# Patient Record
Sex: Male | Born: 1960
Health system: Southern US, Community
[De-identification: ages and names within clinical notes are randomized; demographics above are authoritative.]

## PROBLEM LIST (undated history)

## (undated) ENCOUNTER — Emergency Department (HOSPITAL_COMMUNITY): Payer: Medicare Other

## (undated) DIAGNOSIS — R609 Edema, unspecified: Secondary | ICD-10-CM

## (undated) DIAGNOSIS — N138 Other obstructive and reflux uropathy: Secondary | ICD-10-CM

## (undated) DIAGNOSIS — G8929 Other chronic pain: Secondary | ICD-10-CM

## (undated) DIAGNOSIS — F1010 Alcohol abuse, uncomplicated: Secondary | ICD-10-CM

## (undated) DIAGNOSIS — E119 Type 2 diabetes mellitus without complications: Secondary | ICD-10-CM

## (undated) DIAGNOSIS — G629 Polyneuropathy, unspecified: Secondary | ICD-10-CM

## (undated) DIAGNOSIS — R600 Localized edema: Secondary | ICD-10-CM

## (undated) DIAGNOSIS — Q039 Congenital hydrocephalus, unspecified: Secondary | ICD-10-CM

## (undated) DIAGNOSIS — I10 Essential (primary) hypertension: Secondary | ICD-10-CM

## (undated) DIAGNOSIS — N182 Chronic kidney disease, stage 2 (mild): Secondary | ICD-10-CM

## (undated) DIAGNOSIS — I252 Old myocardial infarction: Secondary | ICD-10-CM

## (undated) DIAGNOSIS — N401 Enlarged prostate with lower urinary tract symptoms: Secondary | ICD-10-CM

## (undated) DIAGNOSIS — Z87828 Personal history of other (healed) physical injury and trauma: Secondary | ICD-10-CM

## (undated) DIAGNOSIS — N329 Bladder disorder, unspecified: Secondary | ICD-10-CM

## (undated) DIAGNOSIS — F1410 Cocaine abuse, uncomplicated: Secondary | ICD-10-CM

## (undated) DIAGNOSIS — E785 Hyperlipidemia, unspecified: Secondary | ICD-10-CM

## (undated) DIAGNOSIS — Z8673 Personal history of transient ischemic attack (TIA), and cerebral infarction without residual deficits: Secondary | ICD-10-CM

## (undated) DIAGNOSIS — Z8709 Personal history of other diseases of the respiratory system: Secondary | ICD-10-CM

## (undated) DIAGNOSIS — M79603 Pain in arm, unspecified: Secondary | ICD-10-CM

## (undated) DIAGNOSIS — R319 Hematuria, unspecified: Secondary | ICD-10-CM

## (undated) DIAGNOSIS — E11319 Type 2 diabetes mellitus with unspecified diabetic retinopathy without macular edema: Secondary | ICD-10-CM

## (undated) DIAGNOSIS — R3912 Poor urinary stream: Secondary | ICD-10-CM

## (undated) DIAGNOSIS — R531 Weakness: Secondary | ICD-10-CM

## (undated) DIAGNOSIS — R2681 Unsteadiness on feet: Secondary | ICD-10-CM

## (undated) DIAGNOSIS — I5032 Chronic diastolic (congestive) heart failure: Secondary | ICD-10-CM

## (undated) HISTORY — PX: CARDIAC CATHETERIZATION: SHX172

## (undated) HISTORY — PX: OTHER SURGICAL HISTORY: SHX169

---
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
## 1998-05-11 ENCOUNTER — Emergency Department (HOSPITAL_COMMUNITY): Admission: EM | Admit: 1998-05-11 | Discharge: 1998-05-11 | Payer: Self-pay | Admitting: Emergency Medicine

## 1998-05-19 ENCOUNTER — Inpatient Hospital Stay (HOSPITAL_COMMUNITY): Admission: EM | Admit: 1998-05-19 | Discharge: 1998-05-22 | Payer: Self-pay | Admitting: Emergency Medicine

## 1998-05-24 ENCOUNTER — Encounter: Admission: RE | Admit: 1998-05-24 | Discharge: 1998-08-22 | Payer: Self-pay | Admitting: Internal Medicine

## 1998-08-30 ENCOUNTER — Emergency Department (HOSPITAL_COMMUNITY): Admission: EM | Admit: 1998-08-30 | Discharge: 1998-08-30 | Payer: Self-pay | Admitting: Emergency Medicine

## 1998-12-31 ENCOUNTER — Emergency Department (HOSPITAL_COMMUNITY): Admission: EM | Admit: 1998-12-31 | Discharge: 1998-12-31 | Payer: Self-pay | Admitting: Emergency Medicine

## 1999-07-09 ENCOUNTER — Emergency Department (HOSPITAL_COMMUNITY): Admission: EM | Admit: 1999-07-09 | Discharge: 1999-07-09 | Payer: Self-pay

## 1999-07-20 ENCOUNTER — Emergency Department (HOSPITAL_COMMUNITY): Admission: EM | Admit: 1999-07-20 | Discharge: 1999-07-20 | Payer: Self-pay | Admitting: Emergency Medicine

## 2000-10-12 ENCOUNTER — Emergency Department (HOSPITAL_COMMUNITY): Admission: EM | Admit: 2000-10-12 | Discharge: 2000-10-12 | Payer: Self-pay | Admitting: Emergency Medicine

## 2000-12-12 ENCOUNTER — Emergency Department (HOSPITAL_COMMUNITY): Admission: EM | Admit: 2000-12-12 | Discharge: 2000-12-12 | Payer: Self-pay | Admitting: Emergency Medicine

## 2001-07-24 ENCOUNTER — Encounter: Payer: Self-pay | Admitting: Emergency Medicine

## 2001-07-24 ENCOUNTER — Emergency Department (HOSPITAL_COMMUNITY): Admission: EM | Admit: 2001-07-24 | Discharge: 2001-07-24 | Payer: Self-pay | Admitting: Unknown Physician Specialty

## 2002-06-07 ENCOUNTER — Encounter: Payer: Self-pay | Admitting: Emergency Medicine

## 2002-06-07 ENCOUNTER — Emergency Department (HOSPITAL_COMMUNITY): Admission: EM | Admit: 2002-06-07 | Discharge: 2002-06-07 | Payer: Self-pay | Admitting: Emergency Medicine

## 2003-03-17 ENCOUNTER — Emergency Department (HOSPITAL_COMMUNITY): Admission: EM | Admit: 2003-03-17 | Discharge: 2003-03-17 | Payer: Self-pay | Admitting: Emergency Medicine

## 2004-02-07 ENCOUNTER — Emergency Department (HOSPITAL_COMMUNITY): Admission: EM | Admit: 2004-02-07 | Discharge: 2004-02-07 | Payer: Self-pay | Admitting: Emergency Medicine

## 2004-02-20 ENCOUNTER — Emergency Department (HOSPITAL_COMMUNITY): Admission: EM | Admit: 2004-02-20 | Discharge: 2004-02-20 | Payer: Self-pay | Admitting: Emergency Medicine

## 2004-02-26 ENCOUNTER — Encounter: Admission: RE | Admit: 2004-02-26 | Discharge: 2004-02-26 | Payer: Self-pay | Admitting: Internal Medicine

## 2004-03-02 ENCOUNTER — Emergency Department (HOSPITAL_COMMUNITY): Admission: EM | Admit: 2004-03-02 | Discharge: 2004-03-02 | Payer: Self-pay | Admitting: Emergency Medicine

## 2004-05-14 ENCOUNTER — Emergency Department (HOSPITAL_COMMUNITY): Admission: EM | Admit: 2004-05-14 | Discharge: 2004-05-15 | Payer: Self-pay | Admitting: Emergency Medicine

## 2004-05-19 ENCOUNTER — Emergency Department (HOSPITAL_COMMUNITY): Admission: EM | Admit: 2004-05-19 | Discharge: 2004-05-19 | Payer: Self-pay | Admitting: Emergency Medicine

## 2004-05-20 ENCOUNTER — Emergency Department (HOSPITAL_COMMUNITY): Admission: EM | Admit: 2004-05-20 | Discharge: 2004-05-20 | Payer: Self-pay | Admitting: Emergency Medicine

## 2004-07-02 ENCOUNTER — Emergency Department (HOSPITAL_COMMUNITY): Admission: EM | Admit: 2004-07-02 | Discharge: 2004-07-02 | Payer: Self-pay

## 2004-12-11 ENCOUNTER — Emergency Department (HOSPITAL_COMMUNITY): Admission: EM | Admit: 2004-12-11 | Discharge: 2004-12-11 | Payer: Self-pay | Admitting: Emergency Medicine

## 2005-02-19 ENCOUNTER — Emergency Department (HOSPITAL_COMMUNITY): Admission: EM | Admit: 2005-02-19 | Discharge: 2005-02-19 | Payer: Self-pay | Admitting: Emergency Medicine

## 2005-02-20 ENCOUNTER — Emergency Department (HOSPITAL_COMMUNITY): Admission: EM | Admit: 2005-02-20 | Discharge: 2005-02-20 | Payer: Self-pay | Admitting: Family Medicine

## 2005-02-26 ENCOUNTER — Emergency Department (HOSPITAL_COMMUNITY): Admission: EM | Admit: 2005-02-26 | Discharge: 2005-02-27 | Payer: Self-pay | Admitting: Emergency Medicine

## 2005-04-10 ENCOUNTER — Emergency Department (HOSPITAL_COMMUNITY): Admission: EM | Admit: 2005-04-10 | Discharge: 2005-04-10 | Payer: Self-pay | Admitting: *Deleted

## 2005-04-14 ENCOUNTER — Ambulatory Visit (HOSPITAL_COMMUNITY): Admission: RE | Admit: 2005-04-14 | Discharge: 2005-04-14 | Payer: Self-pay | Admitting: Orthopaedic Surgery

## 2006-03-22 ENCOUNTER — Emergency Department (HOSPITAL_COMMUNITY): Admission: EM | Admit: 2006-03-22 | Discharge: 2006-03-22 | Payer: Self-pay | Admitting: Emergency Medicine

## 2006-10-16 ENCOUNTER — Emergency Department (HOSPITAL_COMMUNITY): Admission: EM | Admit: 2006-10-16 | Discharge: 2006-10-17 | Payer: Self-pay | Admitting: Emergency Medicine

## 2006-10-24 ENCOUNTER — Emergency Department (HOSPITAL_COMMUNITY): Admission: EM | Admit: 2006-10-24 | Discharge: 2006-10-24 | Payer: Self-pay | Admitting: Emergency Medicine

## 2006-11-06 ENCOUNTER — Ambulatory Visit: Payer: Self-pay | Admitting: Nurse Practitioner

## 2006-11-07 ENCOUNTER — Encounter (INDEPENDENT_AMBULATORY_CARE_PROVIDER_SITE_OTHER): Payer: Self-pay | Admitting: Nurse Practitioner

## 2006-11-07 ENCOUNTER — Ambulatory Visit: Payer: Self-pay | Admitting: *Deleted

## 2006-11-07 LAB — CONVERTED CEMR LAB: TSH: 2.186 microintl units/mL

## 2006-11-14 ENCOUNTER — Ambulatory Visit: Payer: Self-pay | Admitting: Nurse Practitioner

## 2006-12-19 ENCOUNTER — Ambulatory Visit: Payer: Self-pay | Admitting: Nurse Practitioner

## 2006-12-31 ENCOUNTER — Ambulatory Visit: Payer: Self-pay | Admitting: Family Medicine

## 2007-03-28 ENCOUNTER — Encounter (INDEPENDENT_AMBULATORY_CARE_PROVIDER_SITE_OTHER): Payer: Self-pay | Admitting: Nurse Practitioner

## 2007-03-28 DIAGNOSIS — Z6836 Body mass index (BMI) 36.0-36.9, adult: Secondary | ICD-10-CM

## 2007-03-28 DIAGNOSIS — I10 Essential (primary) hypertension: Secondary | ICD-10-CM

## 2007-03-28 DIAGNOSIS — E119 Type 2 diabetes mellitus without complications: Secondary | ICD-10-CM

## 2007-04-01 DIAGNOSIS — E1169 Type 2 diabetes mellitus with other specified complication: Secondary | ICD-10-CM

## 2007-04-01 DIAGNOSIS — E785 Hyperlipidemia, unspecified: Secondary | ICD-10-CM

## 2007-11-16 ENCOUNTER — Emergency Department (HOSPITAL_COMMUNITY): Admission: EM | Admit: 2007-11-16 | Discharge: 2007-11-16 | Payer: Self-pay | Admitting: Emergency Medicine

## 2008-02-03 ENCOUNTER — Emergency Department (HOSPITAL_COMMUNITY): Admission: EM | Admit: 2008-02-03 | Discharge: 2008-02-04 | Payer: Self-pay | Admitting: Emergency Medicine

## 2008-11-29 ENCOUNTER — Ambulatory Visit: Payer: Self-pay | Admitting: Cardiology

## 2008-11-29 ENCOUNTER — Inpatient Hospital Stay (HOSPITAL_COMMUNITY): Admission: EM | Admit: 2008-11-29 | Discharge: 2008-12-01 | Payer: Self-pay | Admitting: Emergency Medicine

## 2008-11-30 ENCOUNTER — Encounter: Payer: Self-pay | Admitting: Internal Medicine

## 2008-11-30 ENCOUNTER — Encounter: Payer: Self-pay | Admitting: Cardiology

## 2008-12-09 ENCOUNTER — Emergency Department (HOSPITAL_COMMUNITY): Admission: EM | Admit: 2008-12-09 | Discharge: 2008-12-09 | Payer: Self-pay | Admitting: *Deleted

## 2008-12-30 ENCOUNTER — Encounter (INDEPENDENT_AMBULATORY_CARE_PROVIDER_SITE_OTHER): Payer: Self-pay | Admitting: *Deleted

## 2009-02-12 ENCOUNTER — Telehealth: Payer: Self-pay | Admitting: Cardiology

## 2009-03-18 ENCOUNTER — Encounter (INDEPENDENT_AMBULATORY_CARE_PROVIDER_SITE_OTHER): Payer: Self-pay | Admitting: *Deleted

## 2009-10-19 ENCOUNTER — Emergency Department (HOSPITAL_COMMUNITY): Admission: EM | Admit: 2009-10-19 | Discharge: 2009-10-20 | Payer: Self-pay | Admitting: Emergency Medicine

## 2010-01-07 ENCOUNTER — Emergency Department (HOSPITAL_COMMUNITY): Admission: EM | Admit: 2010-01-07 | Discharge: 2010-01-07 | Payer: Self-pay | Admitting: Emergency Medicine

## 2010-04-06 ENCOUNTER — Ambulatory Visit: Payer: Self-pay | Admitting: Internal Medicine

## 2010-04-06 LAB — CONVERTED CEMR LAB
ALT: <8 units/L (ref 0–53)
AST: 11 units/L (ref 0–37)
Albumin: 4.3 g/dL (ref 3.5–5.2)
Alkaline Phosphatase: 70 units/L (ref 39–117)
BUN: 12 mg/dL (ref 6–23)
CO2: 28 meq/L (ref 19–32)
Calcium: 9.6 mg/dL (ref 8.4–10.5)
Chloride: 97 meq/L (ref 96–112)
Cholesterol: 245 mg/dL — ABNORMAL HIGH (ref 0–200)
Creatinine, Ser: 0.94 mg/dL (ref 0.40–1.50)
Glucose, Bld: 326 mg/dL — ABNORMAL HIGH (ref 70–99)
HDL: 46 mg/dL (ref >39)
Microalb, Ur: 8.83 mg/dL — ABNORMAL HIGH (ref 0.00–1.89)
Potassium: 5.2 meq/L (ref 3.5–5.3)
Sodium: 135 meq/L (ref 135–145)
Total Bilirubin: 0.4 mg/dL (ref 0.3–1.2)
Total CHOL/HDL Ratio: 5.3
Total Protein: 7.5 g/dL (ref 6.0–8.3)
Triglycerides: 446 mg/dL — ABNORMAL HIGH (ref <150)

## 2010-06-30 ENCOUNTER — Encounter (INDEPENDENT_AMBULATORY_CARE_PROVIDER_SITE_OTHER): Payer: Self-pay | Admitting: Internal Medicine

## 2010-06-30 LAB — CONVERTED CEMR LAB
ALT: 11 units/L (ref 0–53)
AST: 11 units/L (ref 0–37)
Albumin: 4.3 g/dL (ref 3.5–5.2)
Alkaline Phosphatase: 48 units/L (ref 39–117)
Anti Nuclear Antibody(ANA): NEGATIVE
BUN: 14 mg/dL (ref 6–23)
CO2: 29 meq/L (ref 19–32)
Calcium: 9.4 mg/dL (ref 8.4–10.5)
Chloride: 106 meq/L (ref 96–112)
Creatinine, Ser: 1.01 mg/dL (ref 0.40–1.50)
Glucose, Bld: 245 mg/dL — ABNORMAL HIGH (ref 70–99)
Hgb A1c MFr Bld: 8.9 % — ABNORMAL HIGH (ref <5.7)
Microalb, Ur: 8.36 mg/dL — ABNORMAL HIGH (ref 0.00–1.89)
Potassium: 5 meq/L (ref 3.5–5.3)
Sodium: 141 meq/L (ref 135–145)
Total Bilirubin: 0.4 mg/dL (ref 0.3–1.2)
Total Protein: 6.9 g/dL (ref 6.0–8.3)

## 2010-08-15 HISTORY — PX: CARDIOVASCULAR STRESS TEST: SHX262

## 2010-10-16 LAB — DIFFERENTIAL
Basophils Absolute: 0 10*3/uL (ref 0.0–0.1)
Basophils Relative: 1 % (ref 0–1)
Eosinophils Absolute: 0 10*3/uL (ref 0.0–0.7)
Eosinophils Relative: 1 % (ref 0–5)
Lymphocytes Relative: 14 % (ref 12–46)
Lymphs Abs: 0.9 10*3/uL (ref 0.7–4.0)
Monocytes Absolute: 0.6 10*3/uL (ref 0.1–1.0)
Monocytes Relative: 9 % (ref 3–12)
Neutro Abs: 5.1 10*3/uL (ref 1.7–7.7)
Neutrophils Relative %: 76 % (ref 43–77)

## 2010-10-16 LAB — URINALYSIS, ROUTINE W REFLEX MICROSCOPIC
Glucose, UA: >1000 mg/dL — AB
Hgb urine dipstick: NEGATIVE
Ketones, ur: 15 mg/dL — AB
Leukocytes, UA: NEGATIVE
Nitrite: NEGATIVE
Protein, ur: 30 mg/dL — AB
Specific Gravity, Urine: 1.026 (ref 1.005–1.030)
Urobilinogen, UA: 1 mg/dL (ref 0.0–1.0)
pH: 5.5 (ref 5.0–8.0)

## 2010-10-16 LAB — CBC
HCT: 45.5 % (ref 39.0–52.0)
Hemoglobin: 14.6 g/dL (ref 13.0–17.0)
MCHC: 32.1 g/dL (ref 30.0–36.0)
MCV: 84.8 fL (ref 78.0–100.0)
Platelets: 201 10*3/uL (ref 150–400)
RBC: 5.36 MIL/uL (ref 4.22–5.81)
RDW: 13.3 % (ref 11.5–15.5)
WBC: 6.7 10*3/uL (ref 4.0–10.5)

## 2010-10-16 LAB — COMPREHENSIVE METABOLIC PANEL
ALT: 16 U/L (ref 0–53)
AST: 22 U/L (ref 0–37)
Albumin: 3.7 g/dL (ref 3.5–5.2)
Alkaline Phosphatase: 56 U/L (ref 39–117)
BUN: 5 mg/dL — ABNORMAL LOW (ref 6–23)
CO2: 26 mEq/L (ref 19–32)
Calcium: 8.9 mg/dL (ref 8.4–10.5)
Chloride: 100 mEq/L (ref 96–112)
Creatinine, Ser: 0.78 mg/dL (ref 0.4–1.5)
GFR calc Af Amer: >60 mL/min (ref >60)
GFR calc non Af Amer: >60 mL/min (ref >60)
Glucose, Bld: 258 mg/dL — ABNORMAL HIGH (ref 70–99)
Potassium: 4 mEq/L (ref 3.5–5.1)
Sodium: 134 mEq/L — ABNORMAL LOW (ref 135–145)
Total Bilirubin: 0.7 mg/dL (ref 0.3–1.2)
Total Protein: 6.9 g/dL (ref 6.0–8.3)

## 2010-10-16 LAB — POCT CARDIAC MARKERS
CKMB, poc: <1 ng/mL — ABNORMAL LOW (ref 1.0–8.0)
CKMB, poc: <1 ng/mL — ABNORMAL LOW (ref 1.0–8.0)
Myoglobin, poc: 31.9 ng/mL (ref 12–200)
Myoglobin, poc: 34.8 ng/mL (ref 12–200)
Troponin i, poc: <0.05 ng/mL (ref 0.00–0.09)
Troponin i, poc: <0.05 ng/mL (ref 0.00–0.09)

## 2010-10-16 LAB — URINE MICROSCOPIC-ADD ON

## 2010-10-16 LAB — GLUCOSE, CAPILLARY: Glucose-Capillary: 183 mg/dL — ABNORMAL HIGH (ref 70–99)

## 2010-11-01 LAB — CARDIAC PANEL(CRET KIN+CKTOT+MB+TROPI): Troponin I: 0.01 ng/mL (ref 0.00–0.06)

## 2010-11-01 LAB — LIPID PANEL
HDL: 46 mg/dL (ref >39)
Total CHOL/HDL Ratio: 4.2 RATIO
Triglycerides: 53 mg/dL (ref <150)
VLDL: 11 mg/dL (ref 0–40)

## 2010-11-01 LAB — CBC
HCT: 41.9 % (ref 39.0–52.0)
HCT: 37.4 % — ABNORMAL LOW (ref 39.0–52.0)
HCT: 41.1 % (ref 39.0–52.0)
Hemoglobin: 14.1 g/dL (ref 13.0–17.0)
Hemoglobin: 12.6 g/dL — ABNORMAL LOW (ref 13.0–17.0)
Hemoglobin: 13.9 g/dL (ref 13.0–17.0)
MCHC: 33.6 g/dL (ref 30.0–36.0)
MCHC: 33.6 g/dL (ref 30.0–36.0)
MCV: 83.2 fL (ref 78.0–100.0)
MCV: 82.9 fL (ref 78.0–100.0)
MCV: 83 fL (ref 78.0–100.0)
Platelets: 179 10*3/uL (ref 150–400)
Platelets: 183 10*3/uL (ref 150–400)
RDW: 13.3 % (ref 11.5–15.5)
RDW: 13 % (ref 11.5–15.5)
WBC: 9.8 10*3/uL (ref 4.0–10.5)

## 2010-11-01 LAB — COMPREHENSIVE METABOLIC PANEL
ALT: 15 U/L (ref 0–53)
AST: 19 U/L (ref 0–37)
Alkaline Phosphatase: 59 U/L (ref 39–117)
CO2: 26 mEq/L (ref 19–32)
Chloride: 102 mEq/L (ref 96–112)
Creatinine, Ser: 0.8 mg/dL (ref 0.4–1.5)
GFR calc Af Amer: >60 mL/min (ref >60)
GFR calc non Af Amer: >60 mL/min (ref >60)
Potassium: 4 mEq/L (ref 3.5–5.1)
Total Bilirubin: 1.1 mg/dL (ref 0.3–1.2)

## 2010-11-01 LAB — PROTIME-INR
INR: 1 (ref 0.00–1.49)
Prothrombin Time: 13.3 seconds (ref 11.6–15.2)

## 2010-11-01 LAB — DIFFERENTIAL
Basophils Absolute: 0 10*3/uL (ref 0.0–0.1)
Basophils Relative: 0 % (ref 0–1)
Eosinophils Relative: 0 % (ref 0–5)
Monocytes Absolute: 0.3 10*3/uL (ref 0.1–1.0)

## 2010-11-01 LAB — HEPARIN LEVEL (UNFRACTIONATED): Heparin Unfractionated: 0.61 IU/mL (ref 0.30–0.70)

## 2010-11-01 LAB — GLUCOSE, CAPILLARY: Glucose-Capillary: 156 mg/dL — ABNORMAL HIGH (ref 70–99)

## 2010-11-01 LAB — POCT CARDIAC MARKERS

## 2010-11-01 LAB — POCT I-STAT, CHEM 8
BUN: 8 mg/dL (ref 6–23)
Calcium, Ion: 1.04 mmol/L — ABNORMAL LOW (ref 1.12–1.32)
HCT: 45 % (ref 39.0–52.0)
TCO2: 24 mmol/L (ref 0–100)

## 2010-11-01 LAB — APTT: aPTT: 30 seconds (ref 24–37)

## 2010-11-01 LAB — TSH: TSH: 0.866 u[IU]/mL (ref 0.350–4.500)

## 2010-12-06 NOTE — Cardiovascular Report (Signed)
NAMEKANNEN, MOXEY NO.:  [id number]   MEDICAL RECORD NO.:  [id number]          PATIENT TYPE:  INP   LOCATION:  4705                         FACILITY:  [facility]   PHYSICIAN:  Rollene Rotunda, MD, FACCDATE OF BIRTH:  [birth year]   DATE OF PROCEDURE:  12/01/2008  DATE OF DISCHARGE:  12/01/2008                            CARDIAC CATHETERIZATION   PRIMARY CARE Ryken Paschal:  None.   CARDIOLOGIST:  Rollene Rotunda, MD, [facility]   PROCEDURE:  Left heart catheterization/coronary arteriography.   INDICATION:  Evaluate the patient with chest pain, consistent with  unstable angina and possible inferior infarct with peri-infarct ischemia  on Cardiolite.   PROCEDURE NOTE:  Left heart catheterization was performed via the right  femoral artery.  The aorta was cannulated using anterior wall puncture.  A #5-French arterial sheath was inserted via the modified Seldinger  technique.  Preformed Judkins and pigtail catheters were utilized.  The  patient tolerated the procedure well and left the lab in stable  condition.   RESULTS:  Hemodynamics:  LV 173/13, AO 176/90.  Coronaries:  Left main was normal.  The LAD was normal.  First diagonal  was moderate in size and normal.  The circumflex was large and normal.  An obtuse marginal was moderate sized and normal.  The ramus  intermediate was moderate sized and normal.  Right coronary artery was  dominant and large.  There was mid long 25% stenosis.  There was mid  long 40% stenosis.  Left ventriculogram:  Left ventriculogram was obtained in the RAO  projection.  The EF was 65% with normal wall motion.   CONCLUSION:  1. Mild coronary plaque.  2. Normal left ventricular function.   PLAN:  The patient will have aggressive medical management and primary  risk reduction.  The etiology of the chest discomfort is clearly not  anginal and maybe musculoskeletal or gastrointestinal.  He should follow  with a primary care physician for evaluation  of this.      Rollene Rotunda, MD, [facility]  Electronically Signed     JH/MEDQ  D:  12/01/2008  T:  12/02/2008  Job:  [id number]
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
## 2010-12-06 NOTE — Consult Note (Signed)
NAMEALPHONZA, [name] NO.:  [id number]   MEDICAL RECORD NO.:  [id number]          PATIENT TYPE:  INP   LOCATION:  4705                         FACILITY:  [facility]   PHYSICIAN:  Renee Ramus, MD       DATE OF BIRTH:  1961/05/10   DATE OF CONSULTATION:  DATE OF DISCHARGE:  12/01/2008                                 CONSULTATION   HISTORY OF PRESENT ILLNESS:  The patient is a 50 year old male who is  admitted secondary to chest and right shoulder pain.  The patient was  admitted by Cardiology and underwent a stress Myoview, now being  discharged to home.  I was called to see the patient secondary to  elevated blood sugars and consultation regarding diabetes and  hypertension.   PAST MEDICAL HISTORY:  1. Diabetes type 2 diagnosed in 1999, but not treated.  2. Hypertension.  3. Hyperlipemia.   SOCIAL HISTORY:  The patient reports smoking 1 pack per day, denies  alcohol use.   FAMILY HISTORY:  Not available.   REVIEW OF SYSTEMS:  All other comprehensive review of systems are  negative.   MEDICATIONS:  Prehospital, he was not taking any medications.   PHYSICAL EXAMINATION:  GENERAL:  This is a well-developed, well-  nourished [place] American male currently in no apparent distress.  VITAL SIGNS:  Blood pressure 165/88, heart rate 60, respiratory rate 20,  temperature 99.2, he is 99% on room air.  HEENT:  No jugular venous distention or lymphadenopathy.  Oropharynx is  clear.  Mucous membranes are pink and moist.  TMs clear bilaterally.  Pupils are equal, reactive to light and accommodation.  Extraocular  muscles are intact.  CARDIOVASCULAR:  Regular rate and rhythm without murmurs, rubs, or  gallops.  PULMONARY:  Lungs are clear to auscultation bilaterally.  ABDOMEN:  Soft, nontender, and nondistended without hepatosplenomegaly.  Bowel sounds are present.  He has no rebound or guarding.  EXTREMITIES:  He has no clubbing, cyanosis, or edema.  He has good  peripheral pulses in dorsalis pedis and radial artery.  He is able to  move all extremities.  NEUROLOGIC:  Cranial nerves II through XII are grossly intact.  He has  no focal neurological deficits.   STUDIES:  1. Stress Myoview shows no evidence of ischemia or infarction with an      EF of approximately 40%.  2. Chest x-ray shows moderate cardiomegaly.  3. EKG shows no acute disease.   LABORATORY DATA:  With count 6.1, H&H 12/37, platelets 179.  Sodium 139,  potassium 4.0, chloride 102, bicarb 26, BUN 5, creatinine 0.8, glucose  247.   ASSESSMENT:  1. Right shoulder pain, negative cardiac workup per cards.  2. Hypertension.  We will begin the patient on lisinopril 10 mg p.o.      daily considering his diabetes.  3. Diabetes type 2.  The patient should follow with health serv and      has contact information for that.  We will start him on Metformin      500 mg p.o. daily.  4. Hyperlipidemia.  The patient  has received a statin from Cardiology.      He will continue that and follow up with his primary care      physician.   DISPOSITION:  The patient is stable for discharge to  home.  Consult  note was written by referring with the Cardiologist, reviewing the  medical history, and reviewing the personal history.   TIME SPENT:  45 minutes.      Renee Ramus, MD  Electronically Signed     JF/MEDQ  D:  12/01/2008  T:  12/02/2008  Job:  [id number]
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
## 2010-12-09 NOTE — Discharge Summary (Signed)
NAMEANJEL, PARDO NO.:  [id number]   MEDICAL RECORD NO.:  [id number]          PATIENT TYPE:  INP   LOCATION:  4705                         FACILITY:  [facility]   PHYSICIAN:  Rollene Rotunda, MD, FACCDATE OF BIRTH:  09-10-1960   DATE OF ADMISSION:  11/29/2008  DATE OF DISCHARGE:  12/01/2008                               DISCHARGE SUMMARY   PRIMARY CARDIOLOGIST:  Rollene Rotunda, MD, [facility]   PRIMARY CARE:  [facility].   DISCHARGE DIAGNOSES:  1. Chest pain, negative cardiac workup for myocardial infarction      status post cardiac catheterization showing mild coronary plaque,      normal left ventricular function.  2. Diabetes, poorly controlled and untreated:  The patient received      Internal Medicine consult this admission regarding a hemoglobin A1c      of 12.9, was started on metformin for his diabetes and instructed      to follow up as an outpatient with [facility] for further      management.  3. Abnormal stress Myoview this admission, prompting cardiac      catheterization.  The patient had a stress Myoview on Dec 01, 2008,      showing an EF of 42% and questionable soft tissue attenuation,      lateral and apical hypokinesis, and an abnormal echocardiogram      showing an ejection fraction of 55% with septal, basal, and      inferior hypokinesis with mild left ventricular hypertrophy.  4. Hypercholesteremia:  Total cholesterol 193, triglycerides 53, HDL      46, and LDL 136.  The patient started on statin this admission.   PAST MEDICAL HISTORY:  1. Hypertension.  2. Diabetes, untreated.  3. Hyperlipidemia.  4. Tobacco abuse.   Mr. Turano is a 50 year old African American male who presented with  chest and right shoulder pain.  He underwent a stress Myoview that was  abnormal and an echocardiogram as described above, prompting cardiac  catheterization showing normal LV with mild coronary plaque.  Hemoglobin  A1c 12.9.  Internal Medicine was asked  to assist with management of  diabetes.  The patient was started on metformin.  Diabetic education  provided.  The patient remained stable and discharged home to follow up  with Dr. Antoine Poche and [facility].  The patient was not on any  medications prior to hospitalization.   At the time of discharge, he was placed on:  1. Simvastatin 40 mg daily.  2. Aspirin 325.  3. Metformin 500 mg daily.  4. Lisinopril 10 mg daily.  The patient's beta-blocker was discontinued secondary to baseline heart  rate in the 50s.   He has a scheduled followup with Dr. Antoine Poche on December 28, 2008, at 3:45.  He was given the post cardiac catheterization discharge instructions.   DURATION OF DISCHARGE ENCOUNTER:  Less than 30 minutes.      Dorian Pod, ACNP      Rollene Rotunda, MD, [facility]  Electronically Signed    MB/MEDQ  D:  01/04/2009  T:  01/05/2009  Job:  [id number]

## 2010-12-20 ENCOUNTER — Inpatient Hospital Stay (HOSPITAL_COMMUNITY): Payer: Self-pay

## 2010-12-20 ENCOUNTER — Inpatient Hospital Stay (HOSPITAL_COMMUNITY)
Admission: EM | Admit: 2010-12-20 | Discharge: 2010-12-22 | DRG: 069 | Disposition: A | Discharge status: Home / Self Care | Payer: Self-pay | Attending: Internal Medicine | Admitting: Internal Medicine

## 2010-12-20 ENCOUNTER — Emergency Department (HOSPITAL_COMMUNITY): Payer: Self-pay

## 2010-12-20 DIAGNOSIS — F1410 Cocaine abuse, uncomplicated: Secondary | ICD-10-CM | POA: Diagnosis present

## 2010-12-20 DIAGNOSIS — I6350 Cerebral infarction due to unspecified occlusion or stenosis of unspecified cerebral artery: Secondary | ICD-10-CM

## 2010-12-20 DIAGNOSIS — F17200 Nicotine dependence, unspecified, uncomplicated: Secondary | ICD-10-CM | POA: Diagnosis present

## 2010-12-20 DIAGNOSIS — E785 Hyperlipidemia, unspecified: Secondary | ICD-10-CM | POA: Diagnosis present

## 2010-12-20 DIAGNOSIS — Z5900 Homelessness unspecified: Secondary | ICD-10-CM

## 2010-12-20 DIAGNOSIS — IMO0002 Reserved for concepts with insufficient information to code with codable children: Secondary | ICD-10-CM | POA: Diagnosis present

## 2010-12-20 DIAGNOSIS — I10 Essential (primary) hypertension: Secondary | ICD-10-CM | POA: Diagnosis present

## 2010-12-20 DIAGNOSIS — G459 Transient cerebral ischemic attack, unspecified: Principal | ICD-10-CM | POA: Diagnosis present

## 2010-12-20 DIAGNOSIS — Z9119 Patient's noncompliance with other medical treatment and regimen: Secondary | ICD-10-CM

## 2010-12-20 DIAGNOSIS — E1165 Type 2 diabetes mellitus with hyperglycemia: Secondary | ICD-10-CM | POA: Diagnosis present

## 2010-12-20 DIAGNOSIS — R209 Unspecified disturbances of skin sensation: Secondary | ICD-10-CM | POA: Diagnosis present

## 2010-12-20 DIAGNOSIS — Z91199 Patient's noncompliance with other medical treatment and regimen due to unspecified reason: Secondary | ICD-10-CM

## 2010-12-20 LAB — DIFFERENTIAL
Basophils Relative: 1 % (ref 0–1)
Eosinophils Absolute: 0.2 10*3/uL (ref 0.0–0.7)
Lymphs Abs: 2 10*3/uL (ref 0.7–4.0)
Monocytes Absolute: 0.8 10*3/uL (ref 0.1–1.0)
Monocytes Relative: 11 % (ref 3–12)
Neutrophils Relative %: 60 % (ref 43–77)

## 2010-12-20 LAB — URINALYSIS, ROUTINE W REFLEX MICROSCOPIC
Glucose, UA: >1000 mg/dL — AB
Hgb urine dipstick: NEGATIVE
Protein, ur: NEGATIVE mg/dL
Specific Gravity, Urine: 1.022 (ref 1.005–1.030)
pH: 7 (ref 5.0–8.0)

## 2010-12-20 LAB — CK TOTAL AND CKMB (NOT AT ARMC)
CK, MB: 2.1 ng/mL (ref 0.3–4.0)
Relative Index: 1.8 (ref 0.0–2.5)
Total CK: 120 U/L (ref 7–232)

## 2010-12-20 LAB — BASIC METABOLIC PANEL
BUN: 12 mg/dL (ref 6–23)
Chloride: 100 mEq/L (ref 96–112)
Potassium: 4.6 mEq/L (ref 3.5–5.1)

## 2010-12-20 LAB — COMPREHENSIVE METABOLIC PANEL
Albumin: 3.7 g/dL (ref 3.5–5.2)
BUN: 12 mg/dL (ref 6–23)
Calcium: 9.1 mg/dL (ref 8.4–10.5)
Creatinine, Ser: 0.61 mg/dL (ref 0.4–1.5)
Total Protein: 7.1 g/dL (ref 6.0–8.3)

## 2010-12-20 LAB — CARDIAC PANEL(CRET KIN+CKTOT+MB+TROPI)
CK, MB: 2 ng/mL (ref 0.3–4.0)
Total CK: 103 U/L (ref 7–232)
Troponin I: <0.3 ng/mL (ref <0.30)

## 2010-12-20 LAB — CBC
HCT: 42.2 % (ref 39.0–52.0)
Hemoglobin: 14 g/dL (ref 13.0–17.0)
Hemoglobin: 14.3 g/dL (ref 13.0–17.0)
MCH: 27.7 pg (ref 26.0–34.0)
MCHC: 33.7 g/dL (ref 30.0–36.0)
MCHC: 33.9 g/dL (ref 30.0–36.0)
MCV: 82 fL (ref 78.0–100.0)
RBC: 5.06 MIL/uL (ref 4.22–5.81)

## 2010-12-20 LAB — TROPONIN I
Troponin I: <0.3 ng/mL (ref <0.30)
Troponin I: <0.3 ng/mL (ref <0.30)

## 2010-12-20 LAB — APTT: aPTT: 27 seconds (ref 24–37)

## 2010-12-20 LAB — RAPID URINE DRUG SCREEN, HOSP PERFORMED
Amphetamines: NOT DETECTED
Barbiturates: NOT DETECTED
Benzodiazepines: NOT DETECTED
Opiates: NOT DETECTED

## 2010-12-20 LAB — HEMOGLOBIN A1C: Mean Plasma Glucose: 249 mg/dL — ABNORMAL HIGH (ref <117)

## 2010-12-20 LAB — URINE MICROSCOPIC-ADD ON

## 2010-12-20 LAB — GLUCOSE, CAPILLARY: Glucose-Capillary: 231 mg/dL — ABNORMAL HIGH (ref 70–99)

## 2010-12-20 LAB — ETHANOL: Alcohol, Ethyl (B): <11 mg/dL — ABNORMAL HIGH (ref 0–10)

## 2010-12-21 ENCOUNTER — Inpatient Hospital Stay (HOSPITAL_COMMUNITY): Payer: Self-pay

## 2010-12-21 DIAGNOSIS — I6350 Cerebral infarction due to unspecified occlusion or stenosis of unspecified cerebral artery: Secondary | ICD-10-CM

## 2010-12-21 LAB — CBC
HCT: 40.9 % (ref 39.0–52.0)
MCH: 27.9 pg (ref 26.0–34.0)
MCV: 80.8 fL (ref 78.0–100.0)
Platelets: 213 10*3/uL (ref 150–400)
RBC: 5.06 MIL/uL (ref 4.22–5.81)

## 2010-12-21 LAB — CARDIAC PANEL(CRET KIN+CKTOT+MB+TROPI)
CK, MB: 1.8 ng/mL (ref 0.3–4.0)
Total CK: 87 U/L (ref 7–232)

## 2010-12-21 LAB — LIPID PANEL
Cholesterol: 205 mg/dL — ABNORMAL HIGH (ref 0–200)
HDL: 55 mg/dL (ref >39)
LDL Cholesterol: 115 mg/dL — ABNORMAL HIGH (ref 0–99)
Total CHOL/HDL Ratio: 3.7 RATIO
Triglycerides: 175 mg/dL — ABNORMAL HIGH (ref <150)

## 2010-12-21 LAB — GLUCOSE, CAPILLARY
Glucose-Capillary: 309 mg/dL — ABNORMAL HIGH (ref 70–99)
Glucose-Capillary: 241 mg/dL — ABNORMAL HIGH (ref 70–99)
Glucose-Capillary: 240 mg/dL — ABNORMAL HIGH (ref 70–99)

## 2010-12-21 LAB — BASIC METABOLIC PANEL
BUN: 11 mg/dL (ref 6–23)
Chloride: 95 mEq/L — ABNORMAL LOW (ref 96–112)
GFR calc non Af Amer: >60 mL/min (ref >60)
Potassium: 4.4 mEq/L (ref 3.5–5.1)
Sodium: 132 mEq/L — ABNORMAL LOW (ref 135–145)

## 2010-12-22 LAB — BASIC METABOLIC PANEL
Calcium: 9.2 mg/dL (ref 8.4–10.5)
Creatinine, Ser: 0.68 mg/dL (ref 0.4–1.5)
GFR calc Af Amer: >60 mL/min (ref >60)
GFR calc non Af Amer: >60 mL/min (ref >60)
Sodium: 134 mEq/L — ABNORMAL LOW (ref 135–145)

## 2010-12-22 LAB — GLUCOSE, CAPILLARY
Glucose-Capillary: 309 mg/dL — ABNORMAL HIGH (ref 70–99)
Glucose-Capillary: 314 mg/dL — ABNORMAL HIGH (ref 70–99)
Glucose-Capillary: 272 mg/dL — ABNORMAL HIGH (ref 70–99)

## 2011-01-02 NOTE — Discharge Summary (Signed)
[name], [name] NO.:  [id number]  MEDICAL RECORD NO.:  [id number]  LOCATION:  3009                         FACILITY:  [facility]  PHYSICIAN:  Mariea Stable, MD   DATE OF BIRTH:  1961-07-05  DATE OF ADMISSION:  12/20/2010 DATE OF DISCHARGE:  12/22/2010                              DISCHARGE SUMMARY   DISCHARGE DIAGNOSES: 1. Possible transient ischemic attack.  CT of head and MRI with no     acute processes. 2. Uncontrolled diabetes mellitus type 2, hemoglobin A1c of 10.8. 3. Hypertension. 4. Hyperlipidemia.  DISCHARGE MEDICATIONS: 1. Aspirin 81 mg once a day. 2. Pravastatin 10 mg once a day. 3. Metformin 500 mg twice a day. 4. Lisinopril 10 mg once a day. 5. Glipizide 5 mg daily.  DISPOSITION AND FOLLOWUP:  The patient will follow up at [facility] on Friday December 30, 2010, at 9.15 a.m. for a hospital followup.  At the next office  visit, please check blood glucose level and ensure patient has further outpatient diabetes education. Furthemore a basic metabolic panel should be rechecked since the patient was started on lisinopril during this hospital admission.  Metformin and glipizide were initiated during this hospital admission given no prior treatment and these will need further titration.  Please evaluate for any side effects.  HOwever, given A1c, patient will likely need insulin treatment as  opposed to oral anti-hyperglycemics only.  PROCEDURES PERFORMED:   1. CT without contrast of the head.  There were no acute intracranial findings.  No CT evidence of acute infarction.  No intraparenchymal hemorrhage.  Hydrocephalus is unchanged from prior CT.   2. MRI of brain without contrast.  No acute or reversible findings.  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  No evidence for chronic brain ischemic insults either.   3. Carotid Doppler, no significant extracranial carotid artery stenosis demonstrated.  Vertebrals are  patent with antegrade flow.   4. Lumbar puncture with PT/OT consult evaluation, pre and postintervention, opening pressure 17.5 cm of water and 30 mL of CSF removed.  No acute changes noted by PT/OT after removal of fluid.  CONSULTATIONS:  Social Work consult for homelessness and placement issues as well as substance abuse with cocaine.  PT/OT consult for possible TIA and evaluation of normal pressure hydrocephalus post LP.  BRIEF ADMITTING HISTORY AND PHYSICAL:  This is a 50 year old man with past medical history of diabetes mellitus, hypertension, and hyperlipidemia, who presents with a 1-week history of left lower extremity weakness and a 1-day history of left lower and upper extremity numbness and tingling and he was admitted for the evaluation of a possible TIA.  He also reports sporadic gait instability.  He denies nausea, vomiting, urinary incontinence, diarrhea, constipation, change in appetite, loss of consciousness, chest pain, changes in vision, or speech patterns.  The patient is almost was noncompliant with his medicines prior to his admission, which included metformin, aspirin, lisinopril, and simvastatin.  He does smoke a pack a day of cigarettes and was found to be positive for cocaine on a urine drug screen during this hospitalization.  LABORATORY DATA:  Admission labs from Dec 20, 2010, CBC, white blood cell count 7.7,  hemoglobin 14.0,  hematocrit 41.5, MCV 82, platelet count 211.  Neutrophils 60%, lymphocytes 26%, monocytes 11%, eosinophils 3%, basophils 1%.  Absolute neutrophil count 4.7, absolute lymphocyte count 2.0, absolute monocyte 0.8, absolute eosinophil 0.2, absolute basophil 0.1.  Coagulation studies:  PT 13.4 seconds, INR 1.0. Activated PTP 27 seconds.  Basic metabolic panel:  Sodium 140, potassium 4.6, chloride 100, CO2 of 33, glucose 299, BUN 12, creatinine 0.69. Estimated GFR is greater than 60, calcium 9.4.  Cardiac enzymes from 9 a.m. on Dec 20, 2010, troponin less than 0.3 at noon.  On Dec 20, 2010, troponin less than 0.3.  CK-MB was 2.1 and creatinine kinase was 220. Urinalysis showed yellow color that was clear.  Specific gravity 1.022, pH 7.0.  Urine glucose was greater than 1000.  Bilirubin negative, ketones negative, blood negative, protein negative.  Urobilinogen was 1.0.  Nitrite negative.  Leukocytes negative.  EtOH negative.  Urine drug screen was positive for cocaine.  The thyroid stimulating hormone was 1.715, hemoglobin A1c 10.3, vitamin B12 level of 530, and folate was 8.4.  HOSPITAL COURSE: 1. Possible TIA. CVA/TIA workup was initiated       included a CT without contrast, MRI     without contrast, and the carotid artery Doppler study.  All of     these studies were negative for acute processes except that the     head imaging displayed hydrocephaly with ventriculomegaly, which     was unchanged since the head CT done in 2005.  Folate, B12, TSH,     UA, CBC, and a complete metabolic panel were all found to be within     normal limits with the exception that the blood glucose was found     to be 300 with a Hgb A1c level determined to be 10.3.  Additionally, urine     drug screen was found to be positive for cocaine.  The patient was     admitted for monitoring status post possible TIA.  Given the     hydrocephalus and head imaging and history of gait instability, a     workup for normal pressure hydrocephalus was performed, which     included a lumbar puncture with pre and postintervention PT     evaluation.  The opening pressure was found to be 17.5 cm of water     and after removal of 30 mL of cerebral spinal fluid, no changes     were observed in that physical therapy evaluation, thus effectively     ruling out normal pressure hydrocephalus.  The patient reported no     adverse effects from the lumbar puncture and CSF removal and had no     other complaints.  His left lower extremity weakness improved over      the course of 2-day admission, however, residual diminished     sensation likely due to peripheral neuropathy from longstanding     uncontrolled diabetes, remained on the plantar aspect of his left     foot. 2. Diabetes mellitus type 2.  The patient was noncompliant with his     outpatient diabetes regimen of metformin and presented to the ED     with blood sugars in the 300 range and a hemoglobin A1c of 10.3.     He was placed on sliding scale insulin, but his blood sugar levels     remained elevated.  He was discharged with glipizide and metformin. 3. Hypertension.  The patient is again noncompliant with  his     hypertension regimen, which consists of lisinopril.  His blood     pressure range from a 130-150 systolic.  He was discharged with     lisinopril. 4. Hyperlipidemia.  His cholesterol panel during this hospitalization     showed a cholesterol level of 205, triglyceride level of a 175, HDL     55, LDL 115. Pravastatin was intiated.  5. Polysubstance abuse.  UDS was positive for cocaine.  Social Work was     consulted for counseling and to discuss the compliance     issues regarding his ability to take his outpatient medicines.  DISCHARGE VITAL AND LABS:  Temperature 98, pulse 75, respiratory rate 20, blood pressure 126/80, O2 saturation 90% on room air.   BMP Sodium 134, potassium 4.6, chloride 98, CO2 of 28, glucose 276, BUN 10, creatinine 0.68, GFR greater than 60, calcium 9.2.    ______________________________ Almyra Deforest, MD   ______________________________ Mariea Stable, MD    JI/MEDQ  D:  12/23/2010  T:  12/23/2010  Job:  [id number]  cc:   [facility]  Electronically Signed by Almyra Deforest MD on 12/31/2010 10:19:03 PM Electronically Signed by Mariea Stable MD on 01/02/2011 09:04:06 AM

## 2011-02-22 ENCOUNTER — Inpatient Hospital Stay (HOSPITAL_COMMUNITY)
Admission: EM | Admit: 2011-02-22 | Discharge: 2011-02-24 | DRG: 069 | Disposition: A | Discharge status: Home / Self Care | Payer: Self-pay | Attending: Internal Medicine | Admitting: Internal Medicine

## 2011-02-22 ENCOUNTER — Emergency Department (HOSPITAL_COMMUNITY): Payer: Self-pay

## 2011-02-22 DIAGNOSIS — N529 Male erectile dysfunction, unspecified: Secondary | ICD-10-CM | POA: Diagnosis present

## 2011-02-22 DIAGNOSIS — I2510 Atherosclerotic heart disease of native coronary artery without angina pectoris: Secondary | ICD-10-CM | POA: Diagnosis present

## 2011-02-22 DIAGNOSIS — Z79899 Other long term (current) drug therapy: Secondary | ICD-10-CM

## 2011-02-22 DIAGNOSIS — G459 Transient cerebral ischemic attack, unspecified: Principal | ICD-10-CM | POA: Diagnosis present

## 2011-02-22 DIAGNOSIS — Z8673 Personal history of transient ischemic attack (TIA), and cerebral infarction without residual deficits: Secondary | ICD-10-CM

## 2011-02-22 DIAGNOSIS — I10 Essential (primary) hypertension: Secondary | ICD-10-CM | POA: Diagnosis present

## 2011-02-22 DIAGNOSIS — E669 Obesity, unspecified: Secondary | ICD-10-CM | POA: Diagnosis present

## 2011-02-22 DIAGNOSIS — F17200 Nicotine dependence, unspecified, uncomplicated: Secondary | ICD-10-CM | POA: Diagnosis present

## 2011-02-22 DIAGNOSIS — Z7982 Long term (current) use of aspirin: Secondary | ICD-10-CM

## 2011-02-22 DIAGNOSIS — E785 Hyperlipidemia, unspecified: Secondary | ICD-10-CM | POA: Diagnosis present

## 2011-02-22 DIAGNOSIS — IMO0001 Reserved for inherently not codable concepts without codable children: Secondary | ICD-10-CM | POA: Diagnosis present

## 2011-02-22 LAB — POCT I-STAT, CHEM 8
BUN: 9 mg/dL (ref 6–23)
Calcium, Ion: 1.17 mmol/L (ref 1.12–1.32)
Hemoglobin: 17 g/dL (ref 13.0–17.0)
Sodium: 141 mEq/L (ref 135–145)
TCO2: 24 mmol/L (ref 0–100)

## 2011-02-22 LAB — COMPREHENSIVE METABOLIC PANEL
Albumin: 3.9 g/dL (ref 3.5–5.2)
BUN: 9 mg/dL (ref 6–23)
Calcium: 9.5 mg/dL (ref 8.4–10.5)
GFR calc Af Amer: >60 mL/min (ref >60)
Glucose, Bld: 172 mg/dL — ABNORMAL HIGH (ref 70–99)
Sodium: 138 mEq/L (ref 135–145)
Total Protein: 7.5 g/dL (ref 6.0–8.3)

## 2011-02-22 LAB — CK TOTAL AND CKMB (NOT AT ARMC)
Relative Index: 2.1 (ref 0.0–2.5)
Total CK: 137 U/L (ref 7–232)
Total CK: 147 U/L (ref 7–232)

## 2011-02-22 LAB — RAPID URINE DRUG SCREEN, HOSP PERFORMED
Barbiturates: NOT DETECTED
Benzodiazepines: NOT DETECTED
Cocaine: NOT DETECTED

## 2011-02-22 LAB — PROTIME-INR
INR: 0.97 (ref 0.00–1.49)
Prothrombin Time: 13.1 seconds (ref 11.6–15.2)

## 2011-02-22 LAB — URINALYSIS, ROUTINE W REFLEX MICROSCOPIC
Glucose, UA: NEGATIVE mg/dL
Hgb urine dipstick: NEGATIVE
Specific Gravity, Urine: 1.019 (ref 1.005–1.030)
Urobilinogen, UA: 1 mg/dL (ref 0.0–1.0)
pH: 6 (ref 5.0–8.0)

## 2011-02-22 LAB — DIFFERENTIAL
Basophils Absolute: 0.1 10*3/uL (ref 0.0–0.1)
Basophils Relative: 1 % (ref 0–1)
Eosinophils Absolute: 0.2 10*3/uL (ref 0.0–0.7)
Monocytes Relative: 10 % (ref 3–12)
Neutro Abs: 4.7 10*3/uL (ref 1.7–7.7)
Neutrophils Relative %: 56 % (ref 43–77)

## 2011-02-22 LAB — CBC
Hemoglobin: 15.3 g/dL (ref 13.0–17.0)
MCH: 27.9 pg (ref 26.0–34.0)
MCHC: 33.9 g/dL (ref 30.0–36.0)
Platelets: 209 10*3/uL (ref 150–400)
RBC: 5.49 MIL/uL (ref 4.22–5.81)

## 2011-02-22 LAB — TROPONIN I: Troponin I: <0.3 ng/mL (ref <0.30)

## 2011-02-23 ENCOUNTER — Inpatient Hospital Stay (HOSPITAL_COMMUNITY): Payer: Self-pay

## 2011-02-23 LAB — LIPID PANEL
Cholesterol: 205 mg/dL — ABNORMAL HIGH (ref 0–200)
LDL Cholesterol: 91 mg/dL (ref 0–99)
Total CHOL/HDL Ratio: 4.3 RATIO
Triglycerides: 330 mg/dL — ABNORMAL HIGH (ref <150)
VLDL: 66 mg/dL — ABNORMAL HIGH (ref 0–40)

## 2011-02-23 LAB — GLUCOSE, CAPILLARY: Glucose-Capillary: 151 mg/dL — ABNORMAL HIGH (ref 70–99)

## 2011-02-23 LAB — CARDIAC PANEL(CRET KIN+CKTOT+MB+TROPI)
Relative Index: 2 (ref 0.0–2.5)
Total CK: 106 U/L (ref 7–232)
Troponin I: <0.3 ng/mL (ref <0.30)

## 2011-02-23 LAB — HEMOGLOBIN A1C: Hgb A1c MFr Bld: 8.2 % — ABNORMAL HIGH (ref <5.7)

## 2011-02-23 NOTE — Consult Note (Signed)
NAMEAZARIAS, CHIOU NO.:  [id number]  MEDICAL RECORD NO.:  [id number]  LOCATION:  3034                         FACILITY:  [facility]  PHYSICIAN:  Thana Farr, MD    DATE OF BIRTH:  [birth year]  DATE OF CONSULTATION: DATE OF DISCHARGE:                                CONSULTATION   REQUESTING PHYSICIAN:  Knapp  PRESENTING COMPLAINT:  Left hand and leg numbness and weakness.  HISTORY:  Mr. Mcmanaway is a 50 year old male that had a sudden onset of left hand and leg numbness and weakness at approximately 10 o'clock this morning while having sex with his girlfriend.  The patient's girlfriend called EMS at that time and the patient was brought in for evaluation. The patient has a history of TIA and was admitted on January 03, 2011, with left-sided complaints as well.  MRA at that time was unremarkable.  The patient's initial NIH stroke scale on this presentation was 3.  Code stroke was called en route to the ED.  PAST MEDICAL HISTORY: 1. Hypertension. 2. Diabetes. 3. Hypercholesterolemia. 4. Obesity  MEDICATIONS:  Metformin, aspirin, pravastatin, lisinopril, glipizide, Libido-Max.  ALLERGIES:  No known drug allergies.  SOCIAL HISTORY:  On the patient's last presentation, he had a urine drug screen that was positive for cocaine.  The patient says that he has since not repeated such use.  He has no history of tobacco or alcohol abuse.  PHYSICAL EXAMINATION:  VITAL SIGNS:  Blood pressure 171/95, heart rate 86, respiratory rate 20, temperature 98.5, O2 sat 100% on room air. MENTAL STATUS TESTING:  The patient is alert and oriented x3.  Speech is fluent.  The patient could follow commands without difficulty.  Cranial nerve testing II visual fields full.  III, IV, VI extraocular movements intact.  V and VII smile symmetric.  There is decreased pinprick and light touch on the left side of the face but in the midline.  VIII grossly intact.  IX and X positive gag.  XI  bilateral shoulder shrug. XII midline tongue extension.  On motor exam, the patient on voluntarily asked to lift the left lower extremity is unable to lift the leg off the bed.  He also reports that he has difficulty lifting the left arm. There is no left upper extremity drift though.  The patient gives 5/5 strength in both upper extremities with both tested simultaneously. When both legs tested simultaneously, the patient gives 5/5 strength in the lower extremities.  When I lifted the left lower extremity and then pushed down on the leg he was able to resist my weight significantly. On sensory testing, the patient reports decreased pinprick and light touch on both the left upper and lower extremity.  The patient is on the midline on the trunk.  Deep tendon reflexes are 1+ in the bilateral upper extremities and absent in bilateral lower extremities.  On cerebellar testing, finger-to-nose and heel-to-shin intact.  LABORATORY DATA:  Sodium of 141, potassium 4.5, chloride 106, bicarb 24, BUN and creatinine 9 and 1.0 respectively.  Glucose 175.  White blood cell count 8.5, platelet count 409, hemoglobin and hematocrit 17.0 and 50.0 respectively.  PT 13.1, INR 0.97, PTT 28.  CT shows no acute changes.  ASSESSMENT:  Mr. Whitefield is a 50 year old male with acute onset of left- sided numbness and weakness.  With later examinations, the patient's symptoms do seem to be improving with him able to get 5/5 strength throughout.  It is questionable if the patient has actually had a stroke or transient ischemic attack.  With his previous presentation, he was not found to have any strokes on MRI imaging.  The patient does have multiple stroke risk factors though to include illicit drug abuse, diabetes, hypertension, hypercholesterolemia and obesity.  The patient is not a candidate of t-PA at this time due to his improving symptoms.  Further workup is recommended.  PLAN: 1. The patient had Dopplers on  previous evaluation that showed no     significant stenosis.  Echo was not performed.  Would perform an     echo at this time. 2. MRI of the brain. 3. EEG. 4. Continued aspirin daily. 5. Improved BP control.          ______________________________ Thana Farr, MD     LR/MEDQ  D:  02/22/2011  T:  02/22/2011  Job:  [id number]  Electronically Signed by Thana Farr MD on 02/23/2011 08:47:44 AM

## 2011-02-23 NOTE — Procedures (Signed)
EEG NUMBER:  REFERRING PHYSICIAN:  Dr. Sharon Seller.  HISTORY:  A 50 year old male with possible stroke.  MEDICATIONS:  Aspirin, Lovenox, hydrochlorothiazide, lisinopril, Glucotrol, Zocor, Lantus, NovoLog.  CONDITIONS OF RECORDING:  This is a 16-channel EEG carried out patient with the awake, drowsy, and asleep states.  DESCRIPTION:  The waking background activity consists of a low-voltage symmetrical fairly well-organized 10 Hz alpha activity seen from the parieto-occipital and posterotemporal regions.  Low-voltage fast activity poorly organized was seen anteriorly at times superimposed on more posterior rhythms.  A mixture of theta and alpha was seen from the central and temporal regions.  The patient drowses with slowing to irregular which is theta and beta activity.  The patient goes into a light sleep with symmetrical sleep spindles everted with sharp activity and irregular slow activity.  Hypoventilation was not performed. Intermittent photic stimulation failed to elicit any change in the tracing.  IMPRESSION:  This is a normal EEG.          ______________________________ Thana Farr, MD    [id number] D:  02/23/2011 17:27:28  T:  02/23/2011 21:49:20  Job #:  [id number]

## 2011-02-24 LAB — GLUCOSE, CAPILLARY
Glucose-Capillary: 153 mg/dL — ABNORMAL HIGH (ref 70–99)
Glucose-Capillary: 167 mg/dL — ABNORMAL HIGH (ref 70–99)
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
## 2011-04-18 LAB — CBC
HCT: 39.2
MCHC: 33.7
MCV: 82.7
Platelets: 200
RDW: 12.8
WBC: 7.7

## 2011-04-18 LAB — URINALYSIS, ROUTINE W REFLEX MICROSCOPIC
Bilirubin Urine: NEGATIVE
Ketones, ur: NEGATIVE
Nitrite: NEGATIVE
Protein, ur: NEGATIVE
Urobilinogen, UA: 0.2

## 2011-04-18 LAB — DIFFERENTIAL
Eosinophils Absolute: 0.1
Eosinophils Relative: 2
Lymphs Abs: 2.1

## 2011-04-18 LAB — BASIC METABOLIC PANEL
BUN: 6
CO2: 27
Chloride: 102
Glucose, Bld: 314 — ABNORMAL HIGH
Potassium: 4.4

## 2011-04-18 LAB — POCT CARDIAC MARKERS
CKMB, poc: <1 — ABNORMAL LOW
Troponin i, poc: <0.05
Troponin i, poc: <0.05

## 2011-04-18 LAB — LIPASE, BLOOD: Lipase: 35

## 2011-04-20 LAB — POCT I-STAT, CHEM 8
Calcium, Ion: 1.2
Chloride: 99
HCT: 49
TCO2: 31
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
## 2011-04-29 ENCOUNTER — Inpatient Hospital Stay (HOSPITAL_COMMUNITY)
Admission: EM | Admit: 2011-04-29 | Discharge: 2011-04-30 | DRG: 287 | Disposition: A | Discharge status: Home / Self Care | Payer: Self-pay | Attending: Cardiology | Admitting: Cardiology

## 2011-04-29 DIAGNOSIS — R5383 Other fatigue: Secondary | ICD-10-CM | POA: Diagnosis present

## 2011-04-29 DIAGNOSIS — F17200 Nicotine dependence, unspecified, uncomplicated: Secondary | ICD-10-CM | POA: Diagnosis present

## 2011-04-29 DIAGNOSIS — I2510 Atherosclerotic heart disease of native coronary artery without angina pectoris: Secondary | ICD-10-CM | POA: Diagnosis present

## 2011-04-29 DIAGNOSIS — E785 Hyperlipidemia, unspecified: Secondary | ICD-10-CM | POA: Diagnosis present

## 2011-04-29 DIAGNOSIS — R9431 Abnormal electrocardiogram [ECG] [EKG]: Secondary | ICD-10-CM | POA: Diagnosis present

## 2011-04-29 DIAGNOSIS — I10 Essential (primary) hypertension: Principal | ICD-10-CM | POA: Diagnosis present

## 2011-04-29 DIAGNOSIS — Z9119 Patient's noncompliance with other medical treatment and regimen: Secondary | ICD-10-CM

## 2011-04-29 DIAGNOSIS — E1149 Type 2 diabetes mellitus with other diabetic neurological complication: Secondary | ICD-10-CM | POA: Diagnosis present

## 2011-04-29 DIAGNOSIS — I2129 ST elevation (STEMI) myocardial infarction involving other sites: Secondary | ICD-10-CM

## 2011-04-29 DIAGNOSIS — R5381 Other malaise: Secondary | ICD-10-CM

## 2011-04-29 DIAGNOSIS — Z7982 Long term (current) use of aspirin: Secondary | ICD-10-CM

## 2011-04-29 DIAGNOSIS — E1142 Type 2 diabetes mellitus with diabetic polyneuropathy: Secondary | ICD-10-CM | POA: Diagnosis present

## 2011-04-29 DIAGNOSIS — Z8673 Personal history of transient ischemic attack (TIA), and cerebral infarction without residual deficits: Secondary | ICD-10-CM

## 2011-04-29 DIAGNOSIS — Z91199 Patient's noncompliance with other medical treatment and regimen due to unspecified reason: Secondary | ICD-10-CM

## 2011-04-29 LAB — COMPREHENSIVE METABOLIC PANEL
Albumin: 3.8 g/dL (ref 3.5–5.2)
Alkaline Phosphatase: 62 U/L (ref 39–117)
BUN: 10 mg/dL (ref 6–23)
Chloride: 100 mEq/L (ref 96–112)
Creatinine, Ser: 0.74 mg/dL (ref 0.50–1.35)
GFR calc Af Amer: >90 mL/min (ref >90)
GFR calc non Af Amer: >90 mL/min (ref >90)
Glucose, Bld: 222 mg/dL — ABNORMAL HIGH (ref 70–99)
Total Bilirubin: 0.5 mg/dL (ref 0.3–1.2)

## 2011-04-29 LAB — GLUCOSE, CAPILLARY
Glucose-Capillary: 234 mg/dL — ABNORMAL HIGH (ref 70–99)
Glucose-Capillary: 268 mg/dL — ABNORMAL HIGH (ref 70–99)
Glucose-Capillary: 227 mg/dL — ABNORMAL HIGH (ref 70–99)

## 2011-04-29 LAB — DIFFERENTIAL
Basophils Relative: 1 % (ref 0–1)
Eosinophils Absolute: 0.1 10*3/uL (ref 0.0–0.7)
Monocytes Absolute: 0.8 10*3/uL (ref 0.1–1.0)
Monocytes Relative: 9 % (ref 3–12)

## 2011-04-29 LAB — POCT I-STAT, CHEM 8
BUN: 9 mg/dL (ref 6–23)
Creatinine, Ser: 0.8 mg/dL (ref 0.50–1.35)
Glucose, Bld: 230 mg/dL — ABNORMAL HIGH (ref 70–99)
Hemoglobin: 16.7 g/dL (ref 13.0–17.0)
Potassium: 4.2 mEq/L (ref 3.5–5.1)

## 2011-04-29 LAB — CBC
HCT: 43.9 % (ref 39.0–52.0)
Hemoglobin: 15.7 g/dL (ref 13.0–17.0)
MCHC: 35.8 g/dL (ref 30.0–36.0)
MCV: 82.7 fL (ref 78.0–100.0)
RDW: 13.1 % (ref 11.5–15.5)
WBC: 8.3 10*3/uL (ref 4.0–10.5)

## 2011-04-29 LAB — POCT I-STAT TROPONIN I: Troponin i, poc: 0 ng/mL (ref 0.00–0.08)

## 2011-04-29 LAB — APTT: aPTT: 29 seconds (ref 24–37)

## 2011-04-30 LAB — BASIC METABOLIC PANEL
BUN: 12 mg/dL (ref 6–23)
Chloride: 102 mEq/L (ref 96–112)
GFR calc Af Amer: >90 mL/min (ref >90)
GFR calc non Af Amer: >90 mL/min (ref >90)
Potassium: 4.3 mEq/L (ref 3.5–5.1)
Sodium: 136 mEq/L (ref 135–145)

## 2011-04-30 LAB — CBC
MCHC: 34.1 g/dL (ref 30.0–36.0)
Platelets: 184 10*3/uL (ref 150–400)
RDW: 13 % (ref 11.5–15.5)
WBC: 8.1 10*3/uL (ref 4.0–10.5)

## 2011-04-30 LAB — GLUCOSE, CAPILLARY: Glucose-Capillary: 242 mg/dL — ABNORMAL HIGH (ref 70–99)

## 2011-05-01 LAB — POCT ACTIVATED CLOTTING TIME: Activated Clotting Time: 155 seconds

## 2011-05-01 LAB — GLUCOSE, CAPILLARY: Glucose-Capillary: 247 mg/dL — ABNORMAL HIGH (ref 70–99)

## 2011-05-02 NOTE — Cardiovascular Report (Signed)
  [name], [name] NO.:  [id number]  MEDICAL RECORD NO.:  [id number]  LOCATION:  2919                         FACILITY:  [facility]  PHYSICIAN:  Peter M. Swaziland, M.D.  DATE OF BIRTH:  Mar 08, 1961  DATE OF PROCEDURE:  04/29/2011 DATE OF DISCHARGE:                           CARDIAC CATHETERIZATION   INDICATIONS FOR PROCEDURE:  This is a 50 year old African American male who presents with symptoms of weakness.  He has a history of hypertension, noncompliance, and diabetes.  He is severely hypertensive. His ECG shows new ST elevation in leads I and aVL and leads V2. Emergent coronary angiography was recommended.  PROCEDURE:  Left heart catheterization, coronary and left ventricular angiography.  SURGEON:  Peter M. Swaziland, MD  ACCESS:  Via the right femoral artery using standard Seldinger technique.  EQUIPMENT:  6-French 4-cm right and left Judkins catheter, 6-French pigtail catheter, 6-French arterial sheath.  MEDICATIONS:  Local anesthesia 1% Xylocaine, Apresoline 10 mg IV.  CONTRAST:  80 mL of Omnipaque.  HEMODYNAMIC DATA:  Aortic pressure is 169/93 with a mean of 124 mmHg. Left ventricular pressure is 160 with an EDP of 13 mmHg.  ANGIOGRAPHIC DATA:  The left coronary arises and distributes normally. The left main coronary is normal.  The left anterior descending artery is a large vessel with 10-20% irregularity in the proximal vessel.  This is smooth.  The diagonal branches appear normal.  There is a moderate size ramus intermediate branch which is normal.  The left circumflex coronary is a very large vessel which is normal.  The right coronary arises and distributes normally.  It is a dominant vessel.  It is relatively small in caliber.  There is a 30% lesion in the midvessel.  Left ventricular angiography performed in the RAO view demonstrates normal left ventricular size and contractility with ejection fraction estimated at 60%.  FINAL  INTERPRETATION: 1. Nonobstructive atherosclerotic coronary artery disease. 2. Normal left ventricular function.  PLAN:  We would recommend continued aggressive medical treatment for his hypertension.          ______________________________ Peter M. Swaziland, M.D.     PMJ/MEDQ  D:  04/29/2011  T:  04/29/2011  Job:  [id number]  Electronically Signed by PETER Swaziland M.D. on 05/02/2011 01:04:14 PM

## 2011-05-17 NOTE — Discharge Summary (Signed)
NAMEVIAAN, KNIPPENBERG NO.:  [id number]  MEDICAL RECORD NO.:  [id number]  LOCATION:  2919                         FACILITY:  [facility]  PHYSICIAN:  Marca Ancona, MD      DATE OF BIRTH:  [birth year]  DATE OF ADMISSION:  04/29/2011 DATE OF DISCHARGE:  04/30/2011                              DISCHARGE SUMMARY   PRIMARY CARDIOLOGIST:  Marca Ancona, MD  PRIMARY CARE PROVIDER:  None.  DISCHARGE DIAGNOSIS:  Weakness with abnormal EKG.  SECONDARY DIAGNOSES: 1. Nonobstructive coronary artery disease with normal left ventricular     function by catheterization this admission. 2. Diabetes mellitus. 3. Hypertension. 4. Hyperlipidemia. 5. History of transient ischemic attack. 6. Medical nonadherence. 7. Remote cocaine abuse. 8. Ongoing tobacco abuse.  ALLERGIES:  No known drug allergies.  PROCEDURES:  Left heart cardiac catheterization performed on April 29, 2011, revealing 10-20% proximal stenosis in the LAD with normal left circumflex and a 30% stenosis in the mid-right coronary artery.  EF of 60%.  Medical therapy recommended.  HISTORY OF PRESENT ILLNESS:  A 50 year old male with the above problem list.  He has a history of noncompliance and came off all those medications except for metformin.  He does not have primary care provider.  The patient was in his usual state of health until the morning of admission when became profoundly weak after getting out of bed.  He checked his blood sugar and noted that it was in the 200s and presented to the [facility] ED.  In the emergency room, continued to complain of weakness without chest pain or dyspnea.  EKG was done showing lateral ST-segment elevation with an inferior and anterolateral T-wave inversions.  The patient was hypertensive with a blood pressure of 152/85.  Because of his abnormal EKG and symptoms concerning for possible anginal equivalent, a code STEMI was called and the patient was taken emergently to the cath  lab.  HOSPITAL COURSE:  The patient underwent emergent diagnostic catheterization revealing nonobstructive CAD.  He was later transferred to step-down area where his troponin was negative.  His blood pressure remained elevated and he was placed on ACE inhibitor and diuretic therapy and also low-dose statin therapy.  He has been ambulating without difficulty and discharged home today in good condition.  He has been counseled on the importance of smoking cessation.  DISCHARGE LABORATORY DATA:  Hemoglobin 14.2, hematocrit 41.6, WBC 8.1, platelets 184, INR 0.99.  Sodium 136, potassium 4.3, chloride 102, CO2 24, BUN 12, creatinine 0.75, glucose 244.  Total bilirubin 0.5, alkaline phosphatase 62, AST 14, ALT 15, total protein 7.3, albumin 3.8, calcium 9.2, troponin-I 0.00.  MRSA screen was negative.  DISPOSITION:  The patient will be discharged to home today in good condition.  FOLLOWUP PLANS AND APPOINTMENTS:  We will arrange for a followup with Tereso Newcomer, PA in our office but have recommended that the patient follow up with primary care provider for additional care.  DISCHARGE MEDICATIONS: 1. Aspirin 81 mg daily. 2. Hydrochlorothiazide 25 mg daily. 3. Lisinopril 20 mg daily. 4. Simvastatin 10 mg at bedtime. 5. Metformin 1000 mg b.i.d.  OUTSTANDING LABORATORY STUDIES:  The patient will require a follow-up basic metabolic  panel when we seen him in the office in approximately 2 weeks.  DURATION OF DISCHARGE ENCOUNTER:  45 minutes including physician time.     Nicolasa Ducking, ANP   ______________________________ Marca Ancona, MD    CB/MEDQ  D:  04/30/2011  T:  04/30/2011  Job:  [id number]  Electronically Signed by Nicolasa Ducking ANP on 05/03/2011 07:14:47 PM Electronically Signed by Marca Ancona MD on 05/17/2011 07:52:44 PM

## 2011-05-17 NOTE — H&P (Signed)
NAMELENVIL, SWAIM NO.:  [id number]  MEDICAL RECORD NO.:  [id number]  LOCATION:  2919                         FACILITY:  [facility]  PHYSICIAN:  Marca Ancona, MD      DATE OF BIRTH:  [birth year]  DATE OF ADMISSION:  04/29/2011 DATE OF DISCHARGE:                             HISTORY & PHYSICAL   HISTORY OF PRESENT ILLNESS:  This is a 50 year old with a history of diabetes for over 10 years, prior nonobstructive coronary artery disease, prior TIAs, and medical noncompliance who presented with weakness and shakiness to the emergency room this morning.  The patient said when he woke up this morning he felt profoundly weak when he tried to get out of bed.  He was shaky and could barely walk.  He checked his blood sugar and it was in the 200s.  He went to the emergency room.  In the emergency room, he continued to complain of profound weakness.  Once again, blood glucose was in the 200s.  EKG was done showing lateral ST elevation and inferior/anterolateral T-wave inversions.  The patient never described actual chest pain.  He did not report shortness of breath.  He had of note had a recent admission with a TIA in August. Since discharge, however, he has stopped all his medications except for metformin.  MEDICATIONS:  The patient is currently only taking metformin 500 mg b.i.d.  PAST MEDICAL HISTORY: 1. Diabetes.  This was diagnosed in the 1990s, seems to be somewhat     poorly controlled. 2. Hyperlipidemia. 3. Hypertension. 4. History of TIAs.  The patient had TIA in May 2012 and again in     August 2012. 5. Nonobstructive coronary artery disease.  The patient had left heart     cath in May 2010 with a long 40% RCA stenosis. 6. Echocardiogram in August 2012 showed EF of 55-60% with no regional     wall motion abnormalities. 7. Medical noncompliance.  SOCIAL HISTORY:  The patient lives in [place].  He is unemployed.  He is an active smoker.  He used cocaine  remotely  FAMILY HISTORY:  No premature coronary artery disease.  REVIEW OF SYSTEMS:  All systems reviewed and negative except as noted in the history of present illness.  PHYSICAL EXAM:  VITAL SIGNS:  The patient is afebrile.  Pulse is in the 90s-100s, sinus rhythm.  Blood pressure 152/85. GENERAL:  This is an obese male in no apparent stress. HEENT:  Normal exam. ABDOMEN:  Soft and nontender.  No hepatosplenomegaly.  Normal bowel sounds. NECK:  There is no thyromegaly or thyroid nodule.  JVP is elevated at 8- 9 cm of water. CARDIOVASCULAR:  Heart regular, S1 and S2.  There is no S3.  There is an S4 present.  There is no murmur.  There is no carotid bruit. EXTREMITIES:  There is trace pedal edema.  There is 2+ posterior tibial pulse on the right.  I am unable to feel the posterior tibial pulse on the left. LUNGS:  Clear to auscultation bilaterally with normal respiratory effort. SKIN:  Normal exam. NEUROLOGIC:  Alert and oriented x3.  Normal affect.  EKG shows normal sinus rhythm.  There is 2-mm ST elevation laterallywith inferior and anterolateral T-wave inversions.  This is all new. There is some ST elevation in V2 and V3 that is actually old compared to the prior EKG.  Hematocrit 43.9, platelets 200, potassium 4.2, creatinine 0.8.  Cardiac enzymes is not returned yet.  IMPRESSION:  The patient presents with an acute lateral ST-segment elevation myocardial infarction.  He has multiple risk factors for this. He has medical noncompliance.  He received aspirin and heparin bolus in the ER.  We will give him 600 mg of Plavix in the ER, he is not a candidate for ticagrelor or prasugrel given his recurrent TIAs.  We will put him on nitroglycerin drip to control his blood pressure.  He is going to receive Lopressor bolus 5 mg IV x1.  He is going to Cath Lab for emergent cardiac catheterization as we speak.     Marca Ancona, MD     DM/MEDQ  D:  04/29/2011  T:  04/29/2011   Job:  [id number]  Electronically Signed by Marca Ancona MD on 05/17/2011 07:52:51 PM

## 2011-12-14 ENCOUNTER — Emergency Department (HOSPITAL_COMMUNITY)
Admission: EM | Admit: 2011-12-14 | Discharge: 2011-12-15 | Disposition: A | Discharge status: Home / Self Care | Payer: Self-pay | Attending: Emergency Medicine | Admitting: Emergency Medicine

## 2011-12-14 ENCOUNTER — Encounter (HOSPITAL_COMMUNITY): Payer: Self-pay | Admitting: Emergency Medicine

## 2011-12-14 DIAGNOSIS — F17200 Nicotine dependence, unspecified, uncomplicated: Secondary | ICD-10-CM | POA: Insufficient documentation

## 2011-12-14 DIAGNOSIS — L02811 Cutaneous abscess of head [any part, except face]: Secondary | ICD-10-CM

## 2011-12-14 DIAGNOSIS — E119 Type 2 diabetes mellitus without complications: Secondary | ICD-10-CM | POA: Insufficient documentation

## 2011-12-14 DIAGNOSIS — L02818 Cutaneous abscess of other sites: Secondary | ICD-10-CM | POA: Insufficient documentation

## 2011-12-14 NOTE — ED Notes (Signed)
Patient with lump on back of head, had pus and blood coming from it.  Now with headache.

## 2011-12-15 MED ORDER — CEPHALEXIN 500 MG PO CAPS: 500.0000 mg | ORAL_CAPSULE | Freq: Three times a day (TID) | ORAL | Status: DC

## 2011-12-15 MED ORDER — CEPHALEXIN 250 MG PO CAPS
500.0000 mg | ORAL_CAPSULE | Freq: Once | ORAL | Status: AC
  Administered 2011-12-15: 500 mg via ORAL
  Filled 2011-12-15: qty 2

## 2011-12-15 MED ORDER — LIDOCAINE HCL (PF) 1 % IJ SOLN
2.0000 mL | Freq: Once | INTRAMUSCULAR | Status: AC
  Administered 2011-12-15: 2 mL

## 2011-12-15 NOTE — ED Provider Notes (Signed)
History     CSN: [id number]  Arrival date & time 12/14/11  2225   None     Chief Complaint  Patient presents with  . Recurrent Skin Infections    (Consider location/radiation/quality/duration/timing/severity/associated sxs/prior treatment) HPI Comments: Patient states he has had a bump on the back of his head for "a while."  Is painful, cannot put his head on a pillow.  He has been putting alcohol on it.  His dentist on occasion.  It has drained some "pus"  The history is provided by the patient.    Past Medical History  Diagnosis Date  . Diabetes mellitus     History reviewed. No pertinent past surgical history.  History reviewed. No pertinent family history.  History  Substance Use Topics  . Smoking status: Current Everyday Smoker    Types: Cigarettes  . Smokeless tobacco: Not on file  . Alcohol Use: Yes     occassional      Review of Systems  Constitutional: Negative for fever.  Musculoskeletal: Negative for myalgias.  Skin: Positive for wound.  Neurological: Negative for dizziness.    Allergies  Review of patient's allergies indicates no known allergies.  Home Medications   Current Outpatient Rx  Name Route Sig Dispense Refill  . METFORMIN HCL 850 MG PO TABS Oral Take 850 mg by mouth daily.      BP 150/72  Pulse 74  Temp(Src) 98.6 F (37 C) (Oral)  Resp 20  SpO2 97%  Physical Exam  Constitutional: He appears well-developed and well-nourished.  HENT:  Head: Normocephalic.  Eyes: Pupils are equal, round, and reactive to light.  Neck: Normal range of motion.  Cardiovascular: Normal rate.   Pulmonary/Chest: Effort normal.  Musculoskeletal: Normal range of motion.  Neurological: He is alert.  Skin:       3 cm x 2 cm fluctuant abscess on the posterior scalp, he has several small areas that appear to be gaining to become inflamed.  At this point, they look like large pimples and directly distal to the large abscess    ED Course  INCISION AND  DRAINAGE Date/Time: 12/15/2011 1:17 AM Performed by: Arman Filter Authorized by: Arman Filter Consent: Verbal consent obtained. Risks and benefits: risks, benefits and alternatives were discussed Consent given by: patient Patient understanding: patient states understanding of the procedure being performed Patient identity confirmed: verbally with patient Time out: Immediately prior to procedure a "time out" was called to verify the correct patient, procedure, equipment, support staff and site/side marked as required. Type: abscess Body area: head/neck Location details: scalp Anesthesia: local infiltration Local anesthetic: lidocaine 1% without epinephrine Anesthetic total: 1 ml Scalpel size: 11 Needle gauge: 22 Incision type: single straight Complexity: simple Drainage: purulent Drainage amount: copious Packing material: 1/4 in gauze Patient tolerance: Patient tolerated the procedure well with no immediate complications.   (including critical care time)  Labs Reviewed - No data to display No results found.   No diagnosis found.    MDM  Abscess of the scalp        Arman Filter, NP 12/15/11 0121  Arman Filter, NP 12/15/11 0122

## 2011-12-15 NOTE — ED Provider Notes (Signed)
Medical screening examination/treatment/procedure(s) were performed by non-physician practitioner and as supervising physician I was immediately available for consultation/collaboration.  Xylah Early M Breeonna Mone, MD 12/15/11 0648 

## 2011-12-15 NOTE — Discharge Instructions (Signed)
Abscess Care After An abscess (also called a boil or furuncle) is an infected area that contains a collection of pus. Signs and symptoms of an abscess include pain, tenderness, redness, or hardness, or you may feel a moveable soft area under your skin. An abscess can occur anywhere in the body. The infection may spread to surrounding tissues causing cellulitis. A cut (incision) by the surgeon was made over your abscess and the pus was drained out. Gauze may have been packed into the space to provide a drain that will allow the cavity to heal from the inside outwards. The boil may be painful for 5 to 7 days. Most people with a boil do not have high fevers. Your abscess, if seen early, may not have localized, and may not have been lanced. If not, another appointment may be required for this if it does not get better on its own or with medications. HOME CARE INSTRUCTIONS   Only take over-the-counter or prescription medicines for pain, discomfort, or fever as directed by your caregiver.   When you bathe, soak and then remove gauze or iodoform packs at least daily or as directed by your caregiver. You may then wash the wound gently with mild soapy water. Repack with gauze or do as your caregiver directs.  SEEK IMMEDIATE MEDICAL CARE IF:   You develop increased pain, swelling, redness, drainage, or bleeding in the wound site.   You develop signs of generalized infection including muscle aches, chills, fever, or a general ill feeling.   An oral temperature above 102 F (38.9 C) develops, not controlled by medication.  See your caregiver for a recheck if you develop any of the symptoms described above. If medications (antibiotics) were prescribed, take them as directed. Document Released: 01/26/2005 Document Revised: 06/29/2011 Document Reviewed: 09/23/2007 [facility] Patient Information 2012 [place], [place]. Please return to the emergency room on Sunday for further evaluation and reassessment of your  abscess, U. been given an antibiotic.  Please take this as directed if you develop any fever, myalgias, before your due to return, please return sooner for further evaluation

## 2011-12-18 ENCOUNTER — Emergency Department (HOSPITAL_COMMUNITY)
Admission: EM | Admit: 2011-12-18 | Discharge: 2011-12-18 | Disposition: A | Discharge status: Home / Self Care | Payer: Self-pay | Attending: Emergency Medicine | Admitting: Emergency Medicine

## 2011-12-18 ENCOUNTER — Encounter (HOSPITAL_COMMUNITY): Payer: Self-pay | Admitting: Emergency Medicine

## 2011-12-18 DIAGNOSIS — E119 Type 2 diabetes mellitus without complications: Secondary | ICD-10-CM | POA: Insufficient documentation

## 2011-12-18 DIAGNOSIS — Z4800 Encounter for change or removal of nonsurgical wound dressing: Secondary | ICD-10-CM | POA: Insufficient documentation

## 2011-12-18 DIAGNOSIS — Z09 Encounter for follow-up examination after completed treatment for conditions other than malignant neoplasm: Secondary | ICD-10-CM | POA: Insufficient documentation

## 2011-12-18 DIAGNOSIS — L0291 Cutaneous abscess, unspecified: Secondary | ICD-10-CM

## 2011-12-18 DIAGNOSIS — L02818 Cutaneous abscess of other sites: Secondary | ICD-10-CM | POA: Insufficient documentation

## 2011-12-18 DIAGNOSIS — Z79899 Other long term (current) drug therapy: Secondary | ICD-10-CM | POA: Insufficient documentation

## 2011-12-18 NOTE — ED Notes (Signed)
Patient has abscess drained on back of scalp; was told to follow up with ED if he did not have a primary care physician (to get packing removed).  Patient here for re-evaluation.

## 2011-12-18 NOTE — Discharge Instructions (Signed)
Abscess An abscess (boil or furuncle) is an infected area under your skin. This area is filled with yellowish white fluid (pus). HOME CARE   Only take medicine as told by your doctor.   Keep the skin clean around your abscess. Keep clothes that may touch the abscess clean.   Change any bandages (dressings) as told by your doctor.   Avoid direct skin contact with other people. The infection can spread by skin contact with others.   Practice good hygiene and do not share personal care items.   Do not share athletic equipment, towels, or whirlpools. Shower after every practice or work out session.   If a draining area cannot be covered:   Do not play sports.   Children should not go to daycare until the wound has healed or until fluid (drainage) stops coming out of the wound.   See your doctor for a follow-up visit as told.  GET HELP RIGHT AWAY IF:   There is more pain, puffiness (swelling), and redness in the wound site.   There is fluid or bleeding from the wound site.   You have muscle aches, chills, fever, or feel sick.   You or your child has a temperature by mouth above 102 F (38.9 C), not controlled by medicine.   Your baby is older than 3 months with a rectal temperature of 102 F (38.9 C) or higher.  MAKE SURE YOU:   Understand these instructions.   Will watch your condition.   Will get help right away if you are not doing well or get worse.  Document Released: 12/27/2007 Document Revised: 06/29/2011 Document Reviewed: 12/27/2007 [facility] Patient Information 2012 [place], [place].Abscess Care After An abscess (also called a boil or furuncle) is an infected area that contains a collection of pus. Signs and symptoms of an abscess include pain, tenderness, redness, or hardness, or you may feel a moveable soft area under your skin. An abscess can occur anywhere in the body. The infection may spread to surrounding tissues causing cellulitis. A cut (incision) by the  surgeon was made over your abscess and the pus was drained out. Gauze may have been packed into the space to provide a drain that will allow the cavity to heal from the inside outwards. The boil may be painful for 5 to 7 days. Most people with a boil do not have high fevers. Your abscess, if seen early, may not have localized, and may not have been lanced. If not, another appointment may be required for this if it does not get better on its own or with medications. HOME CARE INSTRUCTIONS   Only take over-the-counter or prescription medicines for pain, discomfort, or fever as directed by your caregiver.   When you bathe, soak and then remove gauze or iodoform packs at least daily or as directed by your caregiver. You may then wash the wound gently with mild soapy water. Repack with gauze or do as your caregiver directs.  SEEK IMMEDIATE MEDICAL CARE IF:   You develop increased pain, swelling, redness, drainage, or bleeding in the wound site.   You develop signs of generalized infection including muscle aches, chills, fever, or a general ill feeling.   An oral temperature above 102 F (38.9 C) develops, not controlled by medication.  See your caregiver for a recheck if you develop any of the symptoms described above. If medications (antibiotics) were prescribed, take them as directed. Document Released: 01/26/2005 Document Revised: 06/29/2011 Document Reviewed: 09/23/2007 [employer] Patient Information 2012  [employer]. You may still have some discharge from the area until the skin.  Totally closes wash the area with soap and water on a daily basis Make sure to fill your prescription as this will take care of the other small abscesses.  You have on the back of your neck

## 2011-12-18 NOTE — ED Notes (Signed)
Pt denies any questions or pain upon discharge,  With verbal understanding of importance to fill antibiotic prescriptions.

## 2011-12-18 NOTE — ED Provider Notes (Signed)
History     CSN: [id number]  Arrival date & time 12/18/11  2245   First MD Initiated Contact with Patient 12/18/11 2320      Chief Complaint  Patient presents with  . Abscess    (Consider location/radiation/quality/duration/timing/severity/associated sxs/prior treatment) HPI Comments: Patient here for a reassessment of his I&D of an abscess of the scalp.  That was performed 2 days ago  Patient is a 51 y.o. male presenting with abscess. The history is provided by the patient.  Abscess  The current episode started yesterday. The problem occurs rarely. The problem has been gradually improving. The abscess is present on the scalp. Pertinent negatives include no fever.    Past Medical History  Diagnosis Date  . Diabetes mellitus     History reviewed. No pertinent past surgical history.  History reviewed. No pertinent family history.  History  Substance Use Topics  . Smoking status: Current Everyday Smoker    Types: Cigarettes  . Smokeless tobacco: Not on file  . Alcohol Use: Yes     occassional      Review of Systems  Constitutional: Negative for fever.  Skin: Positive for wound.  Neurological: Negative for headaches.    Allergies  Review of patient's allergies indicates no known allergies.  Home Medications   Current Outpatient Rx  Name Route Sig Dispense Refill  . METFORMIN HCL 850 MG PO TABS Oral Take 850 mg by mouth daily.      BP 180/73  Pulse 90  Temp(Src) 98.3 F (36.8 C) (Oral)  Resp 19  SpO2 100%  Physical Exam  Constitutional: He appears well-developed and well-nourished.  HENT:  Head: Normocephalic.    Cardiovascular: Normal rate.   Pulmonary/Chest: Effort normal.  Musculoskeletal: Normal range of motion.  Neurological: He is alert.  Skin: Skin is warm and dry. No erythema.    ED Course  Procedures (including critical care time)  Labs Reviewed - No data to display No results found.   1. Abscess     Patient states he has not  filled his antibiotic yet he was encouraged to do so  MDM   Packing removal From I&D        Arman Filter, NP 12/18/11 2336  Arman Filter, NP 12/18/11 2336

## 2011-12-19 NOTE — ED Provider Notes (Signed)
Medical screening examination/treatment/procedure(s) were performed by non-physician practitioner and as supervising physician I was immediately available for consultation/collaboration.  Doug Sou, MD 12/19/11 [phone]

## 2011-12-25 ENCOUNTER — Emergency Department (HOSPITAL_COMMUNITY)
Admission: EM | Admit: 2011-12-25 | Discharge: 2011-12-26 | Disposition: A | Discharge status: Home / Self Care | Payer: Self-pay | Attending: Emergency Medicine | Admitting: Emergency Medicine

## 2011-12-25 ENCOUNTER — Encounter (HOSPITAL_COMMUNITY): Payer: Self-pay | Admitting: *Deleted

## 2011-12-25 DIAGNOSIS — R739 Hyperglycemia, unspecified: Secondary | ICD-10-CM

## 2011-12-25 DIAGNOSIS — E785 Hyperlipidemia, unspecified: Secondary | ICD-10-CM | POA: Insufficient documentation

## 2011-12-25 DIAGNOSIS — E1169 Type 2 diabetes mellitus with other specified complication: Secondary | ICD-10-CM | POA: Insufficient documentation

## 2011-12-25 DIAGNOSIS — I10 Essential (primary) hypertension: Secondary | ICD-10-CM | POA: Insufficient documentation

## 2011-12-25 DIAGNOSIS — M79609 Pain in unspecified limb: Secondary | ICD-10-CM | POA: Insufficient documentation

## 2011-12-25 DIAGNOSIS — R29898 Other symptoms and signs involving the musculoskeletal system: Secondary | ICD-10-CM | POA: Insufficient documentation

## 2011-12-25 DIAGNOSIS — F17200 Nicotine dependence, unspecified, uncomplicated: Secondary | ICD-10-CM | POA: Insufficient documentation

## 2011-12-25 DIAGNOSIS — E860 Dehydration: Secondary | ICD-10-CM | POA: Insufficient documentation

## 2011-12-25 NOTE — ED Notes (Signed)
Bilateral leg pain since 9am.  Pain in his thighs and worse when he stands up, 8/10 on pain scale and loses his balance due to pain.  Denies dizziness but leg gives out due to pain.  Patient has had difficulty with ambulation for about 6 months but the pain started today.  Pedal pulses present, numbness to both big toes

## 2011-12-26 LAB — URINE MICROSCOPIC-ADD ON

## 2011-12-26 LAB — COMPREHENSIVE METABOLIC PANEL
Albumin: 3.9 g/dL (ref 3.5–5.2)
Alkaline Phosphatase: 69 U/L (ref 39–117)
BUN: 10 mg/dL (ref 6–23)
Chloride: 100 mEq/L (ref 96–112)
Creatinine, Ser: 0.73 mg/dL (ref 0.50–1.35)
GFR calc Af Amer: >90 mL/min (ref >90)
GFR calc non Af Amer: >90 mL/min (ref >90)
Glucose, Bld: 300 mg/dL — ABNORMAL HIGH (ref 70–99)
Total Bilirubin: 0.6 mg/dL (ref 0.3–1.2)

## 2011-12-26 LAB — URINALYSIS, ROUTINE W REFLEX MICROSCOPIC
Glucose, UA: >1000 mg/dL — AB
Ketones, ur: NEGATIVE mg/dL
Leukocytes, UA: NEGATIVE
Nitrite: NEGATIVE
Protein, ur: 100 mg/dL — AB
Urobilinogen, UA: 1 mg/dL (ref 0.0–1.0)

## 2011-12-26 LAB — CBC
HCT: 45.6 % (ref 39.0–52.0)
Hemoglobin: 16 g/dL (ref 13.0–17.0)
MCH: 28.8 pg (ref 26.0–34.0)
MCHC: 35.1 g/dL (ref 30.0–36.0)
RBC: 5.56 MIL/uL (ref 4.22–5.81)

## 2011-12-26 LAB — DIFFERENTIAL
Basophils Relative: 1 % (ref 0–1)
Eosinophils Absolute: 0.1 10*3/uL (ref 0.0–0.7)
Lymphs Abs: 2.9 10*3/uL (ref 0.7–4.0)
Monocytes Absolute: 0.8 10*3/uL (ref 0.1–1.0)
Monocytes Relative: 8 % (ref 3–12)
Neutro Abs: 5.7 10*3/uL (ref 1.7–7.7)

## 2011-12-26 LAB — SEDIMENTATION RATE: Sed Rate: 13 mm/hr (ref 0–16)

## 2011-12-26 MED ORDER — SODIUM CHLORIDE 0.9 % IV BOLUS (SEPSIS)
1000.0000 mL | Freq: Once | INTRAVENOUS | Status: AC
  Administered 2011-12-26: 1000 mL via INTRAVENOUS

## 2011-12-26 MED ORDER — SODIUM CHLORIDE 0.9 % IV BOLUS (SEPSIS)
1000.0000 mL | INTRAVENOUS | Status: AC
  Administered 2011-12-26: 1000 mL via INTRAVENOUS

## 2011-12-26 MED ORDER — METFORMIN HCL 500 MG PO TABS: 1000.0000 mg | ORAL_TABLET | Freq: Every day | ORAL | Status: DC

## 2011-12-26 NOTE — ED Notes (Signed)
Patient alert and oriented.  Voiced understanding for instructions and prescriptions

## 2011-12-26 NOTE — ED Provider Notes (Signed)
History     CSN: [id number]  Arrival date & time 12/25/11  2004   None     Chief Complaint  Patient presents with  . Leg Pain    (Consider location/radiation/quality/duration/timing/severity/associated sxs/prior treatment) HPI Comments: 51 year old male with history of diabetes, hypertension, hyperlipidemia, history of ongoing tobacco use, remote cocaine abuse and a history of a transient ischemic attack. He presents today because of a complaint of generalized weakness of his bilateral lower extremities. He states that his legs have felt generally weak for approximately one month, he has noted occasional difficulty getting out of bed because of weakness in his legs. Over the last 24 hours this has become somewhat worse and he has presented because of this weakness. The weakness is associated with pain in his bilateral quadriceps area that occurs when he stands up or uses his legs. He denies swelling, rashes, numbness to the legs and has no difficulty urinating, no pain in his back, and no symptoms in his upper extremities. He readily admits that he has been conserving medication because he does not have refills on his prescriptions and has not been taking his diabetic meds as prescribed.  Of note patient had presented in October of 2012 with complaints of generalized weakness and had an abnormal EKG at that time prompting a coronary catheterization which showed normal ejection fraction and nonobstructive coronary disease.  Patient is a 51 y.o. male presenting with leg pain. The history is provided by the patient and medical records.  Leg Pain     Past Medical History  Diagnosis Date  . Diabetes mellitus     History reviewed. No pertinent past surgical history.  History reviewed. No pertinent family history.  History  Substance Use Topics  . Smoking status: Current Everyday Smoker    Types: Cigarettes  . Smokeless tobacco: Not on file  . Alcohol Use: Yes     occassional       Review of Systems  All other systems reviewed and are negative.    Allergies  Review of patient's allergies indicates no known allergies.  Home Medications   Current Outpatient Rx  Name Route Sig Dispense Refill  . METFORMIN HCL 850 MG PO TABS Oral Take 850 mg by mouth daily.    Marland Kitchen METFORMIN HCL 500 MG PO TABS Oral Take 2 tablets (1,000 mg total) by mouth daily with breakfast. 60 tablet 3    BP 144/83  Pulse 64  Temp(Src) 97.1 F (36.2 C) (Oral)  Resp 18  SpO2 98%  Physical Exam  Nursing note and vitals reviewed. Constitutional: He appears well-developed and well-nourished. No distress.  HENT:  Head: Normocephalic and atraumatic.  Mouth/Throat: No oropharyngeal exudate.       Mucous membranes are dry  Eyes: Conjunctivae and EOM are normal. Pupils are equal, round, and reactive to light. Right eye exhibits no discharge. Left eye exhibits no discharge. No scleral icterus.  Neck: Normal range of motion. Neck supple. No JVD present. No thyromegaly present.  Cardiovascular: Normal rate, regular rhythm, normal heart sounds and intact distal pulses.  Exam reveals no gallop and no friction rub.   No murmur heard. Pulmonary/Chest: Effort normal and breath sounds normal. No respiratory distress. He has no wheezes. He has no rales.  Abdominal: Soft. Bowel sounds are normal. He exhibits no distension and no mass. There is no tenderness.  Musculoskeletal: Normal range of motion. He exhibits no edema and no tenderness.  Lymphadenopathy:    He has no cervical adenopathy.  Neurological: He is alert. Coordination normal.       Neurologic exam:  Speech clear, pupils equal round reactive to light, extraocular movements intact  Normal peripheral visual fields Cranial nerves III through XII normal including no facial droop Follows commands, moves all extremities x4, normal strength to bilateral upper and lower extremities at all major muscle groups including grip - has some  tenderness with using the quadricep muscles however strength when isolated is 5 out of 5 bilaterally. Normal strength at the hamstrings, calves and anterior lower extremity compartment. Sensation normal to light touch and pinprick in all 4 extremities Coordination intact, no limb ataxia, finger-nose-finger normal Rapid alternating movements normal No pronator drift    Skin: Skin is warm and dry. No rash noted. No erythema.  Psychiatric: He has a normal mood and affect. His behavior is normal.    ED Course  Procedures (including critical care time)  Labs Reviewed  COMPREHENSIVE METABOLIC PANEL - Abnormal; Notable for the following:    Glucose, Bld 300 (*)    All other components within normal limits  URINALYSIS, ROUTINE W REFLEX MICROSCOPIC - Abnormal; Notable for the following:    Color, Urine AMBER (*) BIOCHEMICALS MAY BE AFFECTED BY COLOR   APPearance TURBID (*)    Specific Gravity, Urine >1.046 (*)    Glucose, UA >1000 (*)    Bilirubin Urine SMALL (*)    Protein, ur 100 (*)    All other components within normal limits  GLUCOSE, CAPILLARY - Abnormal; Notable for the following:    Glucose-Capillary 280 (*)    All other components within normal limits  URINE MICROSCOPIC-ADD ON - Abnormal; Notable for the following:    Bacteria, UA FEW (*)    Casts HYALINE CASTS (*)    All other components within normal limits  CBC  DIFFERENTIAL  SEDIMENTATION RATE   No results found.   1. Lower extremity weakness   2. Dehydration   3. Hyperglycemia       MDM  The patient has an exam with mild subjective weakness to the proximal lower extremity muscles which has been present for a month, normal deep tendon reflexes and sensation and some tenderness to his anterior quadricep muscles. Unsure of etiology at this time, laboratory workup to rule out electrolyte abnormalities, hyperglycemia, diabetic ketoacidosis, sedimentation rate. IV fluids. He specifically denies chest pain, back pain,  abdominal pain, numbness.   The patient has put out over 300 cc of urine after getting IV fluids. I have reviewed his test results with him including a normal metabolic panel, no anion gap, hyperglycemia is really the only significant abnormality other than dehydration given his high specific gravity on urinalysis. The patient states that he feels better, has a persistent mild discomfort in his upper legs and feels like he is weak but is able to ambulate. We'll discharge home, has given resource list for followup in the community, have given prescription for refill on his metformin.  Vida Roller, MD 12/26/11 [phone]

## 2011-12-26 NOTE — ED Notes (Signed)
CBG 280 

## 2011-12-26 NOTE — Discharge Instructions (Signed)
We have given you lots of IV fluids to help rehydrate you and have check laboratory test  Which have not showed any significant abnormalities. Please drink plenty of fluids, be as active as possible and call your Dr. in the morning when you get home to set up a followup appointment for the next 2 days. If you do not have a Dr. see the list of followup numbers, return to the hospital for severe or worsening weakness.  RESOURCE GUIDE  Chronic Pain Problems: Contact [facility]  [phone] Patients need to be referred by their primary care doctor.  Insufficient Money for Medicine: Contact United Way:  call "211" or [facility] [phone].  No Primary Care Doctor: - Call [facility]  [phone] - can help you locate a primary care doctor that  accepts your insurance, provides certain services, etc. - Physician Referral Service- [phone]  Agencies that provide inexpensive medical care: - [facility]  [phone] - [facility]  [phone] - [facility]  [phone] - Women's Clinic  [phone] - Planned Parenthood  [phone] [facility]  [phone]  Medicaid-accepting [facility] Providers: - [facility]- [address]  [phone], Mon-Fri 9am-7pm, Sat 9am-1pm - [facility]- [address] [place], Suite [place]  [phone] - [facility]- [address] [place]  [phone] [facility]- [address]  [phone] - Renaye Rakers- [address] [place], [address], [phone]  Only accepts [place] patients after they have their name  applied to their card  Self Pay (no insurance) in [place]: - Sickle Cell Patients: Dr Willey Blade, [facility]  [address] [place], [phone] - [facility]- [address] [place]  [phone]       [facility]-  [address]       -     [facility]- see information above (Speak to [employer] if you do not have insurance)       -  [facility]- [address] [place], [phone]       -  [facility]- [address],  [phone]       -  [facility]- [address] [phone]       -  Dr Julio Sicks-  [address], [place], [phone]       -  [facility]- [address], [phone]       -  [facility]- [address] [phone], also [address], [phone]       -    [facility]- [address] [place], [phone], 1st & 3rd Saturday   every month, 10am-1pm  1) Find a Doctor and Pay Out of Pocket Although you won't have to find out who is covered by your insurance plan, it is a good idea to ask around and get recommendations. You will then need to call the office and see if the doctor you have chosen will accept you as a new patient and what types of options they offer for patients who are self-pay. Some doctors offer discounts or will set up payment plans for their patients who do not have insurance, but you will need to ask so  you aren't surprised when you get to your appointment.  2) Contact Your Local Health Department Not all health departments have doctors that can see patients for sick visits, but many do, so it is worth a call to see if yours does. If you don't know where your local health department is, you can check in your phone book. The CDC also has a tool to help you locate your state's health department, and many state websites also have listings of all of their local health departments.  3) Find a Walk-in Clinic If your illness is not likely to be very severe or complicated, you may want to try a walk in clinic. These are popping up all over the country in pharmacies, drugstores, and shopping centers. They're usually staffed by nurse practitioners or physician assistants that have been trained  to treat common illnesses and complaints. They're usually fairly quick and inexpensive. However, if you have serious medical issues or chronic medical problems, these are probably not your best option  STD Testing - [facility] Department of [facility], STD Clinic, [address], [place], phone [phone] or [phone].  Monday - Friday, call for an appointment. [facility] Department of [employer], STD Clinic, [address], [place], phone [phone] or [phone].  Monday - Friday, call for an appointment.  Abuse/Neglect: [facility] Child Abuse Hotline 7571361764 [facility] Child Abuse Hotline 770-112-7437 (After Hours)  Emergency Shelter:  [facility] [phone]  Maternity Homes: - [facility] at [facility] [place] [facility] [phone] - [facility] 8386741888  MRSA Hotline #:   367-252-6293  [facility] Resources  [facility] [place]  [employer] [facility] Dept. [address]                 [address]         [address] [place] [address]  Blondell Reveal Phone:  [phone]                                  Phone:  [phone]                   Phone:  [phone]  [facility] Mental Health, [phone] - [facility] - [phone]       -     [facility] in [place], [address]                                  4587550805, [facility] Child Abuse Hotline 6168325694 or [phone] (After Hours)   Behavioral Health Services  Substance Abuse Resources: - [facility] and Drug Services  740-137-2758 - [facility] [phone] - [facility] [phone]) [phone] Floydene Flock [phone] - Residential & Outpatient Substance Abuse Program  [phone]  Psychological Services: [facility]  [phone] Services  [phone] - [facility], [place] [address] [place], ACCESS LINE: (405) [phone] or [phone], [employer]  Dental Assistance  If unable to pay or uninsured, contact:  [facility] or [facility] to become qualified for the adult dental clinic.  Patients with Medicaid: [facility] [phone] [address], [phone] [address], [phone]  If unable to pay, or uninsured, contact [facility] ([phone]) or [facility] Department [phone] in [place], [phone] in [facility]) to become qualified for the adult dental clinic  Other Low-Cost Community Dental Services: - [facility]- [address] [place], [place], [place], [address], [phone], Ext. [phone], 2nd and 4th Thursday of the month at 6:30am.  10 clients each day by appointment, can sometimes see walk-in patients if someone does not show for an appointment. [facility]- [address] [place], [place], [address], [phone] - [facility]- [address], [place], [place], [address], [phone] - [facility]- [phone] [facility] Department- [phone] [facility] Department- [phone]

## 2012-04-04 ENCOUNTER — Emergency Department (HOSPITAL_COMMUNITY)
Admission: EM | Admit: 2012-04-04 | Discharge: 2012-04-05 | Disposition: A | Discharge status: Home / Self Care | Payer: Self-pay | Attending: Emergency Medicine | Admitting: Emergency Medicine

## 2012-04-04 ENCOUNTER — Encounter (HOSPITAL_COMMUNITY): Payer: Self-pay | Admitting: *Deleted

## 2012-04-04 DIAGNOSIS — R739 Hyperglycemia, unspecified: Secondary | ICD-10-CM

## 2012-04-04 DIAGNOSIS — E119 Type 2 diabetes mellitus without complications: Secondary | ICD-10-CM | POA: Insufficient documentation

## 2012-04-04 DIAGNOSIS — F17200 Nicotine dependence, unspecified, uncomplicated: Secondary | ICD-10-CM | POA: Insufficient documentation

## 2012-04-04 DIAGNOSIS — M549 Dorsalgia, unspecified: Secondary | ICD-10-CM | POA: Insufficient documentation

## 2012-04-04 LAB — POCT I-STAT, CHEM 8
Calcium, Ion: 1.17 mmol/L (ref 1.12–1.23)
Chloride: 105 mEq/L (ref 96–112)
Creatinine, Ser: 1 mg/dL (ref 0.50–1.35)
Glucose, Bld: 290 mg/dL — ABNORMAL HIGH (ref 70–99)
HCT: 48 % (ref 39.0–52.0)
Hemoglobin: 17.3 g/dL — ABNORMAL HIGH (ref 13.0–17.0)
Potassium: 4.2 mEq/L (ref 3.5–5.1)
Sodium: 137 mEq/L (ref 135–145)
TCO2: 23 mmol/L (ref 0–100)

## 2012-04-04 LAB — GLUCOSE, CAPILLARY: Glucose-Capillary: 323 mg/dL — ABNORMAL HIGH (ref 70–99)

## 2012-04-04 LAB — POTASSIUM: Potassium: 5.4 mEq/L — ABNORMAL HIGH (ref 3.5–5.1)

## 2012-04-04 MED ORDER — SODIUM CHLORIDE 0.9 % IV BOLUS (SEPSIS)
1000.0000 mL | Freq: Once | INTRAVENOUS | Status: AC
  Administered 2012-04-04: 1000 mL via INTRAVENOUS

## 2012-04-04 MED ORDER — HYDROCODONE-ACETAMINOPHEN 5-325 MG PO TABS
1.0000 | ORAL_TABLET | Freq: Once | ORAL | Status: AC
  Administered 2012-04-04: 1 via ORAL
  Filled 2012-04-04: qty 1

## 2012-04-04 MED ORDER — METFORMIN HCL 1000 MG PO TABS: 1000.0000 mg | ORAL_TABLET | Freq: Two times a day (BID) | ORAL | Status: DC

## 2012-04-04 MED ORDER — METFORMIN HCL 850 MG PO TABS
850.0000 mg | ORAL_TABLET | Freq: Once | ORAL | Status: AC
  Administered 2012-04-04: 850 mg via ORAL
  Filled 2012-04-04: qty 1

## 2012-04-04 NOTE — ED Provider Notes (Signed)
51 year old male with history of diabetes presents with multiple complaints. Patient reports since last night has been having pain which started in his right lower leg and radiates up to his right low back. Described pain as a constant dull sensation worsening with walking. He denies any specific injury. Denies any urinary symptoms. No fever, chills, abdominal pain, urinary all bowel incontinence, or saddle anesthesia. No rash.  Patient also reports to sensation to his left thumb and pinky, along with tingling sensation to his toes on both feet. Symptom has been ongoing for months.  Denies dropping object.  Has been taking his metformin but sts he does not watch his diet nor does he check his blood sugar on a regular basis.    On exam, pt is A&Ox3, heart S1S2, no M/R/G, Lung CTAB, abd soft and nontender, R leg with normal hip flexion and extension, no point tenderness, R knee with normal ROM, lower leg without palpable cords, erythema, homans negative, no foot drop. Neuro: Speech clear, pupils equal round reactive to light, extraocular movements intact  Normal peripheral visual fields Cranial nerves III through XII normal including no facial droop Follows commands, moves all extremities x4, normal strength to bilateral upper and lower extremities at all major muscle groups including grip Sensation normal to light touch Coordination intact, no limb ataxia, finger-nose-finger normal Rapid alternating movements normal No pronator drift Gait with slight limb.  Pt has CBG >300, will move to main ED for further management.    Fayrene Helper, PA-C 04/04/12 2039

## 2012-04-04 NOTE — ED Provider Notes (Signed)
History     CSN: [id number]  Arrival date & time 04/04/12  [name]   First MD Initiated Contact with Patient 04/04/12 2000      Chief Complaint  Patient presents with  . Pain   HPI  History provided by the patient. Patient is a 51 year old male with history of diabetes who presents with complaints of worsening bilateral lower extremity numbness and pain is. Patient states symptoms have been worse most recently in the right lower extremity. Pain is associated with some low back discomforts. Patient denies any injury or trauma to the back or legs. He denies any swelling or reduced range of motion. Denies any weakness. Patient currently without PCP and has not been taking normal metformin medications regularly. He denies any recent fever, chills, night sweats, weight loss or headaches. He denies any urinary or fecal incontinence, urinary retention or perineal numbness.    Past Medical History  Diagnosis Date  . Diabetes mellitus     History reviewed. No pertinent past surgical history.  History reviewed. No pertinent family history.  History  Substance Use Topics  . Smoking status: Current Every Day Smoker    Types: Cigarettes  . Smokeless tobacco: Not on file  . Alcohol Use: Yes     occassional      Review of Systems  Constitutional: Negative for fever and chills.  Respiratory: Negative for shortness of breath.   Cardiovascular: Negative for chest pain and leg swelling.  Neurological: Positive for numbness. Negative for weakness.    Allergies  Review of patient's allergies indicates no known allergies.  Home Medications   Current Outpatient Rx  Name Route Sig Dispense Refill  . METFORMIN HCL 850 MG PO TABS Oral Take 850 mg by mouth daily.      BP 174/84  Pulse 78  Temp 97.6 F (36.4 C) (Oral)  Resp 16  Ht 6\' 3"  (1.905 m)  Wt 267 lb (121.11 kg)  BMI 33.37 kg/m2  SpO2 100%  Physical Exam  Nursing note and vitals reviewed. Constitutional: He is oriented to  person, place, and time. He appears well-developed and well-nourished. No distress.  HENT:  Head: Normocephalic.  Cardiovascular: Normal rate and regular rhythm.   Pulmonary/Chest: Effort normal and breath sounds normal. No respiratory distress. He has no wheezes.  Abdominal: Soft. There is no tenderness. There is no rebound and no guarding.       Obese  Musculoskeletal: Normal range of motion. He exhibits no edema and no tenderness.       Ulcers or lesions to the feet bilaterally. No edema. Normal range of motion. Normal pulses and sensations.  Neurological: He is alert and oriented to person, place, and time.  Skin: Skin is warm. No rash noted. No erythema.  Psychiatric: He has a normal mood and affect.    ED Course  Procedures   Results for orders placed during the hospital encounter of 04/04/12  GLUCOSE, CAPILLARY      Component Value Range   Glucose-Capillary 323 (*) 70 - 99 mg/dL  POCT I-STAT, CHEM 8      Component Value Range   Sodium 137  135 - 145 mEq/L   Potassium 5.4 (*) 3.5 - 5.1 mEq/L   Chloride 104  96 - 112 mEq/L   BUN 16  6 - 23 mg/dL   Creatinine, Ser 7.82  0.50 - 1.35 mg/dL   Glucose, Bld 956 (*) 70 - 99 mg/dL   Calcium, Ion 2.13  0.86 - 1.23 mmol/L  TCO2 23  0 - 100 mmol/L   Hemoglobin 17.3 (*) 13.0 - 17.0 g/dL   HCT 16.1  09.6 - 04.5 %  POTASSIUM      Component Value Range   Potassium 5.4 (*) 3.5 - 5.1 mEq/L  POCT I-STAT, CHEM 8      Component Value Range   Sodium 138  135 - 145 mEq/L   Potassium 4.2  3.5 - 5.1 mEq/L   Chloride 105  96 - 112 mEq/L   BUN 14  6 - 23 mg/dL   Creatinine, Ser 4.09  0.50 - 1.35 mg/dL   Glucose, Bld 811 (*) 70 - 99 mg/dL   Calcium, Ion 9.14  7.82 - 1.23 mmol/L   TCO2 22  0 - 100 mmol/L   Hemoglobin 16.3  13.0 - 17.0 g/dL   HCT 95.6  21.3 - 08.6 %       1. Hyperglycemia   2. Back pain       MDM  9:25 PM patient seen and evaluated. Patient resting comfortable in bed in no acute distress. CBG greater than 300.  Will obtain i-STAT evaluate anion gap. Doubt DKA. Patient has been several days to weeks without metformin. Will give normal dose here and IV fluids.  Potassium slightly elevated. IV fluids given. Recheck of potassium shows improvements. Blood sugar also improving with fluids. Patient given dose of metformin.      Date: 04/04/2012  Rate: 84  Rhythm: normal sinus rhythm  QRS Axis: normal  Intervals: normal  ST/T Wave abnormalities: nonspecific ST/T changes  Conduction Disutrbances:left anterior fascicular block  Narrative Interpretation:   Old EKG Reviewed: unchanged from 04/30/2011       Angus Seller, PA 04/04/12 2359

## 2012-04-04 NOTE — ED Provider Notes (Signed)
Medical screening examination/treatment/procedure(s) were performed by non-physician practitioner and as supervising physician I was immediately available for consultation/collaboration.  Hazen Brumett, MD 04/04/12 2312 

## 2012-04-04 NOTE — ED Notes (Signed)
Pt reports pain to rt knee that began approx midnight last night and left finger tingling and now bilat great toes are tingling.

## 2012-04-06 NOTE — ED Provider Notes (Signed)
Medical screening examination/treatment/procedure(s) were performed by non-physician practitioner and as supervising physician I was immediately available for consultation/collaboration.  Cyndra Numbers, MD 04/06/12 [phone]

## 2012-04-27 ENCOUNTER — Emergency Department (HOSPITAL_COMMUNITY): Payer: Self-pay

## 2012-04-27 ENCOUNTER — Emergency Department (HOSPITAL_COMMUNITY)
Admission: EM | Admit: 2012-04-27 | Discharge: 2012-04-27 | Disposition: A | Discharge status: Home / Self Care | Payer: Self-pay | Attending: Emergency Medicine | Admitting: Emergency Medicine

## 2012-04-27 ENCOUNTER — Encounter (HOSPITAL_COMMUNITY): Payer: Self-pay | Admitting: *Deleted

## 2012-04-27 DIAGNOSIS — F449 Dissociative and conversion disorder, unspecified: Secondary | ICD-10-CM | POA: Insufficient documentation

## 2012-04-27 DIAGNOSIS — R29810 Facial weakness: Secondary | ICD-10-CM | POA: Insufficient documentation

## 2012-04-27 DIAGNOSIS — G319 Degenerative disease of nervous system, unspecified: Secondary | ICD-10-CM | POA: Insufficient documentation

## 2012-04-27 DIAGNOSIS — E119 Type 2 diabetes mellitus without complications: Secondary | ICD-10-CM | POA: Insufficient documentation

## 2012-04-27 DIAGNOSIS — F444 Conversion disorder with motor symptom or deficit: Secondary | ICD-10-CM

## 2012-04-27 LAB — POCT I-STAT, CHEM 8
BUN: 14 mg/dL (ref 6–23)
Creatinine, Ser: 1.1 mg/dL (ref 0.50–1.35)
Potassium: 4.8 mEq/L (ref 3.5–5.1)
Sodium: 141 mEq/L (ref 135–145)

## 2012-04-27 LAB — COMPREHENSIVE METABOLIC PANEL
ALT: 16 U/L (ref 0–53)
AST: 15 U/L (ref 0–37)
Albumin: 4.3 g/dL (ref 3.5–5.2)
Alkaline Phosphatase: 64 U/L (ref 39–117)
Potassium: 4.7 mEq/L (ref 3.5–5.1)
Sodium: 137 mEq/L (ref 135–145)
Total Protein: 8 g/dL (ref 6.0–8.3)

## 2012-04-27 LAB — PROTIME-INR
INR: 1 (ref 0.00–1.49)
Prothrombin Time: 13.1 seconds (ref 11.6–15.2)

## 2012-04-27 LAB — GLUCOSE, CAPILLARY

## 2012-04-27 LAB — DIFFERENTIAL
Basophils Absolute: 0.1 10*3/uL (ref 0.0–0.1)
Basophils Relative: 1 % (ref 0–1)
Eosinophils Absolute: 0.1 10*3/uL (ref 0.0–0.7)
Lymphs Abs: 1.9 10*3/uL (ref 0.7–4.0)
Neutrophils Relative %: 72 % (ref 43–77)

## 2012-04-27 LAB — CBC
MCH: 29.4 pg (ref 26.0–34.0)
Platelets: 236 10*3/uL (ref 150–400)
RBC: 5.92 MIL/uL — ABNORMAL HIGH (ref 4.22–5.81)
RDW: 12.8 % (ref 11.5–15.5)

## 2012-04-27 MED ORDER — SODIUM CHLORIDE 0.9 % IV BOLUS (SEPSIS)
1000.0000 mL | Freq: Once | INTRAVENOUS | Status: AC
  Administered 2012-04-27: 1000 mL via INTRAVENOUS

## 2012-04-27 NOTE — Consult Note (Signed)
Reason for Consult: Concern for stroke  CC: Right-sided weakness, speech difficulty  History is obtained from: Patient, cousin  HPI: [name] is an 51 y.o. male he states that for the past month he's been having intermittent episodes of right-sided weakness and stuttering. Over the past 2 weeks his girlfriend has not been doing well, and is currently inpatient doing drug rehabilitation. He went to visit her today and while he was there became weaker began to stutter, and therefore was brought to the emergency room.  Of note, he's had multiple workups for similar episodes in the past sometimes right-sided and sometimes left-sided. He has had 2 previous MRIs both of which were negative.   ROS: An 11 point ROS was performed and is negative except as noted in the HPI.  Past Medical History  Diagnosis Date  . Diabetes mellitus     Family History: Strokes in family  Social History: Tob: Smokes daily  Exam: Current vital signs: BP 173/80  Pulse 96  Temp 98.7 F (37.1 C) (Oral)  Resp 16  SpO2 98% Vital signs in last 24 hours: Temp:  [98.7 F (37.1 C)] 98.7 F (37.1 C) (10/05 1754) Pulse Rate:  [96-100] 96  (10/05 1754) Resp:  [16-20] 16  (10/05 1754) BP: (173-174)/(80-91) 173/80 mmHg (10/05 1754) SpO2:  [98 %] 98 % (10/05 1754)  General: In bed, appears anxious CV: Regular rate and rhythm Mental Status: Patient is awake, alert, oriented to person, place, month, year, and situation. Immediate and remote memory are intact. Patient is able to give a clear and coherent history. Patient stutters intermittently, and then when distracted will speak normally for a second before resuming his stutter he also has an increased latency of response Cranial Nerves: II: Visual Fields are full. Pupils are equal, round, and reactive to light.  Discs are sharp. III,IV, VI: EOMI without ptosis or diploplia. Normal saccades V,VII: Facial sensation decreased on right pinprick, he does not  move the right side his face as much as the left, but when asked to puff his cheeks out he puffs the left cheek out and holds the right cheek tight VIII: hearing is intact to voice X: Uvula elevates symmetrically XI: Shoulder shrug is symmetric. XII: tongue is midline without atrophy or fasciculations.  Motor: Tone is normal. Bulk is normal. Left side was 5/5 strength, on his right he has an intermittent exam is inconsistent. He is able to hold his right arm up without drift, but to confrontation has significant proximal weakness (4/5). He also has an encouragable exam in his right lower extremity.  Sensory: Sensation is diminished to pinprick throughout the right side Deep Tendon Reflexes: 2+ and symmetric in the biceps and patellae.  Plantars: Toes are downgoing bilaterally.  Cerebellar: FNF intact bilaterally, however this is only when distracted. He initially has significant tremor bilaterally, but with distraction the tremor goes away completely. Gait: Did not assessed due to patient safety concerns  I have reviewed labs in epic and the results pertinent to this consultation are: BMP remarkable only for elevated glucose  I have reviewed the images obtained: CT head was negative except for his long-standing enlarged ventricles, MRI brain was negative for acute changes   Impression: 51 year old male with multiple findings on exam suggestive of nonorganic etiology. Because of his multiple risk factors we did perform an MRI to ensure that he had not had a small stroke with functional overlay.  Given his normal MRI and findings on physical exam as  well as multiple previous admissions for similar findings with normal workup I think that we can say that this represents a conversion disorder.  He has an acute stressor and that his girlfriend is not doing well and the symptoms started as he was going to visit her in rehab.  Recommendations: 1) I discussed with the patient that I suspected  that this was a stress reaction. 2) no further neurological workup needed at this time   Ritta Slot, MD [facility] [phone]

## 2012-04-27 NOTE — ED Provider Notes (Signed)
History     CSN: [id number]  Arrival date & time 04/27/12  1502   First MD Initiated Contact with Patient 04/27/12 1503      No chief complaint on file.   (Consider location/radiation/quality/duration/timing/severity/associated sxs/prior treatment) Patient is a 51 y.o. male presenting with neurologic complaint. The history is provided by the patient.  Neurologic Problem The primary symptoms include paresthesias, focal weakness and speech change. Primary symptoms do not include headaches, syncope, loss of consciousness, altered mental status, seizures, dizziness, visual change, fever, nausea or vomiting. The symptoms began 2 to 6 hours ago (after lunch today (not sure what time lunch was)). The symptoms are worsening.  Paresthesias began greater than 24 hours ago. The paresthesias are unchanged. The paresthesias are described as tingling. Affected locations include the: right foot, right distal leg and right thigh.  Weakness began greater than 24 hours (but has gotten much worse today) ago. The weakness is worsening. There is near normal muscle function with maximum physical effort.  There is weakness in these regions/motions: facial muscles (right arm and leg). There is impairment of the following actions: articulating words. There is no impairment of the following actions: moving eyes or closing eyes.  Change in speech began 1 - 3 hours ago. The speech change is worsening. Features of the speech change include inability to speak fluently.  Additional symptoms include weakness (right side), pain (right leg starting at knee and continuing distally) and leg pain. Medical issues also include diabetes. Medical issues do not include cerebral vascular accident.    Past Medical History  Diagnosis Date  . Diabetes mellitus     No past surgical history on file.  No family history on file.  History  Substance Use Topics  . Smoking status: Current Every Day Smoker    Types: Cigarettes  .  Smokeless tobacco: Not on file  . Alcohol Use: Yes     occassional      Review of Systems  Constitutional: Negative for fever.  Respiratory: Negative for cough and shortness of breath.   Cardiovascular: Negative for chest pain and syncope.  Gastrointestinal: Negative for nausea, vomiting, abdominal pain and diarrhea.  Neurological: Positive for speech change, focal weakness, weakness (right side) and paresthesias. Negative for dizziness, seizures, loss of consciousness and headaches.  Psychiatric/Behavioral: Negative for altered mental status.  All other systems reviewed and are negative.    Allergies  Review of patient's allergies indicates no known allergies.  Home Medications   Current Outpatient Rx  Name Route Sig Dispense Refill  . METFORMIN HCL 1000 MG PO TABS Oral Take 1 tablet (1,000 mg total) by mouth 2 (two) times daily. 30 tablet 0  . METFORMIN HCL 850 MG PO TABS Oral Take 850 mg by mouth daily.      There were no vitals taken for this visit.  Physical Exam  Nursing note and vitals reviewed. Constitutional: He is oriented to person, place, and time. He appears well-developed and well-nourished. No distress.  HENT:  Head: Normocephalic and atraumatic.  Eyes: Pupils are equal, round, and reactive to light.  Cardiovascular: Normal rate and normal heart sounds.   Pulmonary/Chest: Effort normal and breath sounds normal. No respiratory distress.  Abdominal: Soft. He exhibits no distension. There is no tenderness.  Musculoskeletal: Normal range of motion.  Neurological: He is alert and oriented to person, place, and time. A cranial nerve deficit and sensory deficit is present. He exhibits abnormal muscle tone. Coordination normal. GCS eye subscore is 4. GCS verbal  subscore is 5. GCS motor subscore is 6.       Left facial droop, stuttering speech getting worse throughout exam (no history of same), mild diffuse right side weakness; reported decreased sensation on right  side of face as compared to left   Skin: Skin is warm and dry.  Psychiatric: He has a normal mood and affect.    ED Course  Procedures (including critical care time)  Labs Reviewed  CBC - Abnormal; Notable for the following:    WBC 10.6 (*)     RBC 5.92 (*)     Hemoglobin 17.4 (*)     All other components within normal limits  COMPREHENSIVE METABOLIC PANEL - Abnormal; Notable for the following:    Glucose, Bld 264 (*)     All other components within normal limits  GLUCOSE, CAPILLARY - Abnormal; Notable for the following:    Glucose-Capillary 284 (*)     All other components within normal limits  POCT I-STAT, CHEM 8 - Abnormal; Notable for the following:    Glucose, Bld 265 (*)     Hemoglobin 18.0 (*)     HCT 53.0 (*)     All other components within normal limits  PROTIME-INR  APTT  DIFFERENTIAL  TROPONIN I   Ct Head Wo Contrast  04/27/2012  *RADIOLOGY REPORT*  Clinical Data: Right-sided weakness.  Code stroke.  Slurred speech.  CT HEAD WITHOUT CONTRAST  Technique:  Contiguous axial images were obtained from the base of the skull through the vertex without contrast.  Comparison: 02/22/2011  Findings: Absent septum pellucidum is noted. There is no evidence for hemorrhage, mass lesion, or acute infarction.  There is mild central cortical atrophy. Bone windows are negative for acute findings.  Note is made of scalp lesions posteriorly.  IMPRESSION:  1. No evidence for acute intracranial abnormality. 2.  Absent septum pellucidum  Critical test results telephoned to Dr. Dr. Manus Gunning at the time of interpretation on date 04/27/2012 at time 3:38 p.m.   Original Report Authenticated By: Patterson Hammersmith, M.D.    Mr Brain Wo Contrast  04/27/2012  *RADIOLOGY REPORT*  Clinical Data: Right-sided weakness.  Speech disturbance.  MRI HEAD WITHOUT CONTRAST  Technique:  Multiplanar, multiecho pulse sequences of the brain and surrounding structures were obtained according to standard protocol  without intravenous contrast.  Comparison: Head CT same day. Multiple previous examinations 2012. Head CT 05/19/2004.  Findings: Diffusion imaging does not show any acute or subacute infarction. The brainstem and cerebellum are normal.  The cerebral hemispheres show congenital absence of the septum pellucidum. There is chronic ventricular prominence, unchanged since the previous exams.  There are a few punctate foci of T2 and FLAIR signal within the frontal white matter, not likely of clinical relevance.  No mass lesion, hemorrhage, suspicion of obstructive hydrocephalus or extra-axial collection.  No pituitary mass.  No significant sinus disease.  No skull or skull base lesion.  IMPRESSION: No acute finding.  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   Original Report Authenticated By: Thomasenia Sales, M.D.     Date: 04/27/2012  Rate: 53  Rhythm: sinus tachycardia  QRS Axis: left  Intervals: normal  ST/T Wave abnormalities: early repolarization particularly noticeable in V2-V4   Conduction Disutrbances:left anterior fascicular block  Narrative Interpretation:   Old EKG Reviewed: unchanged    1. 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 Pt mentions continued and worsening right side weakness, worst in leg associated with pain and tingling. Pt is poorly controlled DM 2 who takes only metformin. His CBG have been in 300s recently.  3:46 PM Dr. Amada Jupiter feels that the patient is either conversion disorder or embellishing the symptoms he does have. Will get MRI brain to look for small sensory stroke.  5:57 PM MRI unremarkable. Likely conversion disorder. Symptoms with marked improvement on re-exam. Pt advised to find PCP to help control his DM.  Daleen Bo, MD 04/28/12 [phone]

## 2012-04-27 NOTE — ED Notes (Signed)
Pt. Presents to the ED with a month c/o weakness.  Pt. Went to "see his lady friend and was unable to walk or stand on his own."  EMS found the pt. Sitting in a wheel chair.  Pt, has c/o "voice changing in the past 2 hours."  Pt. Is noted weaker on the right side.  Pt. Upon triage had clear voice that has begun to start quivering, pt. Stops talking, and starts again.

## 2012-04-27 NOTE — ED Notes (Signed)
Called Code Stroke at 15:15

## 2012-04-27 NOTE — ED Notes (Signed)
Pt undressed, on monitor, continuous pulse oximetry and blood pressure cuff; EKG performed

## 2012-04-28 NOTE — ED Provider Notes (Signed)
I saw and evaluated the patient, reviewed the resident's note and I agree with the findings and plan.  Intermittent R sided weakness and stuttering speech over the past month.  Speech acutely worse today with in past 2-4 hours. Code stroke called and addressed with Dr. Amada Jupiter who feels that patient has a conversion disorder.  Recommends MRI to rule out small stroke.  Glynn Octave, MD 04/28/12 [phone]

## 2012-06-24 ENCOUNTER — Emergency Department (HOSPITAL_COMMUNITY)
Admission: EM | Admit: 2012-06-24 | Discharge: 2012-06-24 | Disposition: A | Discharge status: Home / Self Care | Payer: Self-pay | Attending: Emergency Medicine | Admitting: Emergency Medicine

## 2012-06-24 ENCOUNTER — Emergency Department (HOSPITAL_COMMUNITY): Payer: Self-pay

## 2012-06-24 DIAGNOSIS — R209 Unspecified disturbances of skin sensation: Secondary | ICD-10-CM | POA: Insufficient documentation

## 2012-06-24 DIAGNOSIS — Z79899 Other long term (current) drug therapy: Secondary | ICD-10-CM | POA: Insufficient documentation

## 2012-06-24 DIAGNOSIS — R5383 Other fatigue: Secondary | ICD-10-CM | POA: Insufficient documentation

## 2012-06-24 DIAGNOSIS — M79606 Pain in leg, unspecified: Secondary | ICD-10-CM

## 2012-06-24 DIAGNOSIS — M79609 Pain in unspecified limb: Secondary | ICD-10-CM | POA: Insufficient documentation

## 2012-06-24 DIAGNOSIS — F449 Dissociative and conversion disorder, unspecified: Secondary | ICD-10-CM

## 2012-06-24 DIAGNOSIS — M6281 Muscle weakness (generalized): Secondary | ICD-10-CM | POA: Insufficient documentation

## 2012-06-24 DIAGNOSIS — M1990 Unspecified osteoarthritis, unspecified site: Secondary | ICD-10-CM | POA: Insufficient documentation

## 2012-06-24 DIAGNOSIS — R29898 Other symptoms and signs involving the musculoskeletal system: Secondary | ICD-10-CM

## 2012-06-24 DIAGNOSIS — F17200 Nicotine dependence, unspecified, uncomplicated: Secondary | ICD-10-CM | POA: Insufficient documentation

## 2012-06-24 DIAGNOSIS — R5381 Other malaise: Secondary | ICD-10-CM | POA: Insufficient documentation

## 2012-06-24 DIAGNOSIS — R269 Unspecified abnormalities of gait and mobility: Secondary | ICD-10-CM | POA: Insufficient documentation

## 2012-06-24 DIAGNOSIS — E119 Type 2 diabetes mellitus without complications: Secondary | ICD-10-CM

## 2012-06-24 DIAGNOSIS — IMO0001 Reserved for inherently not codable concepts without codable children: Secondary | ICD-10-CM | POA: Insufficient documentation

## 2012-06-24 DIAGNOSIS — I10 Essential (primary) hypertension: Secondary | ICD-10-CM

## 2012-06-24 MED ORDER — OXYCODONE-ACETAMINOPHEN 5-325 MG PO TABS: 2.0000 | ORAL_TABLET | ORAL | Status: DC | PRN

## 2012-06-24 MED ORDER — MORPHINE SULFATE 4 MG/ML IJ SOLN
2.0000 mg | Freq: Once | INTRAMUSCULAR | Status: AC
  Administered 2012-06-24: 20:00:00 via INTRAMUSCULAR
  Filled 2012-06-24: qty 1

## 2012-06-24 MED ORDER — ACETAMINOPHEN 325 MG PO TABS
975.0000 mg | ORAL_TABLET | Freq: Once | ORAL | Status: AC
  Administered 2012-06-24: 975 mg via ORAL
  Filled 2012-06-24: qty 3

## 2012-06-24 NOTE — ED Provider Notes (Signed)
History     CSN: [id number]  Arrival date & time 06/24/12  1424   First MD Initiated Contact with Patient 06/24/12 1808      Chief Complaint  Patient presents with  . R leg pain     (Consider location/radiation/quality/duration/timing/severity/associated sxs/prior treatment) The history is provided by the patient and medical records.    [name] is a 51 y.o. male  with a hx of diabetes and conversion disorder presents to the Emergency Department complaining of gradual, persistent, progressively worsening pain to the L leg onset this morning after awakening. Associated symptoms include difficulty ambulating 2/2 pain and weakness in the L leg, lightheadedness, nausea last night without vomiting. Pain begins in his foot and is associated with paresthesias in the inside of his leg.  The paresthesias are described as tingling. Affected locations include the: right foot, right distal leg and right thigh.  Nothing makes it better and certain movements makes it worse. Pt denies fever, chills, headache, chest pain, abdominal pain, vomiting, diarrhea, loss of bowel/bladder function, saddle anesthesia.  CBG 145 by EMS enroute.  Pt also with 20lb weight loss in the last 2 months associated with taste changes and therefore less food intake.  Pt seen 04/27/12 for similar episode, and continued to be "wobbly with my walker" since that time.      Past Medical History  Diagnosis Date  . Diabetes mellitus     No past surgical history on file.  No family history on file.  History  Substance Use Topics  . Smoking status: Current Every Day Smoker    Types: Cigarettes  . Smokeless tobacco: Not on file  . Alcohol Use: Yes     Comment: occassional      Review of Systems  Constitutional: Negative for fever, diaphoresis, appetite change, fatigue and unexpected weight change.  HENT: Negative for mouth sores and neck stiffness.   Eyes: Negative for visual disturbance.  Respiratory: Negative for  cough, chest tightness, shortness of breath and wheezing.   Cardiovascular: Negative for chest pain.  Gastrointestinal: Negative for nausea, vomiting, abdominal pain, diarrhea and constipation.  Genitourinary: Negative for dysuria, urgency, frequency and hematuria.  Musculoskeletal: Positive for myalgias, arthralgias and gait problem.  Skin: Negative for rash.  Neurological: Positive for weakness, light-headedness and numbness. Negative for syncope and headaches.  Hematological: Does not bruise/bleed easily.  Psychiatric/Behavioral: Negative for sleep disturbance. The patient is not nervous/anxious.   All other systems reviewed and are negative.    Allergies  Review of patient's allergies indicates no known allergies.  Home Medications   Current Outpatient Rx  Name  Route  Sig  Dispense  Refill  . LISINOPRIL-HYDROCHLOROTHIAZIDE 20-12.5 MG PO TABS   Oral   Take 1 tablet by mouth daily.         Marland Kitchen METFORMIN HCL 1000 MG PO TABS   Oral   Take 1 tablet (1,000 mg total) by mouth 2 (two) times daily.   30 tablet   0   . OXYCODONE-ACETAMINOPHEN 5-325 MG PO TABS   Oral   Take 2 tablets by mouth every 4 (four) hours as needed for pain.   20 tablet   0     BP 144/79  Pulse 99  Temp 98.5 F (36.9 C) (Oral)  Resp 16  SpO2 100%  Physical Exam  Nursing note and vitals reviewed. Constitutional: He appears well-developed and well-nourished. No distress.  HENT:  Head: Normocephalic and atraumatic.  Mouth/Throat: Oropharynx is clear and moist. No  oropharyngeal exudate.  Eyes: Conjunctivae normal are normal. No scleral icterus.  Neck: Normal range of motion. Neck supple.  Cardiovascular: Normal rate, regular rhythm, normal heart sounds and intact distal pulses.  Exam reveals no gallop and no friction rub.   No murmur heard. Pulmonary/Chest: Effort normal and breath sounds normal. No respiratory distress. He has no wheezes. He has no rales. He exhibits no tenderness.    Genitourinary: Rectal exam shows no external hemorrhoid, no internal hemorrhoid, no fissure, no mass, no tenderness and anal tone normal.  Musculoskeletal: He exhibits no edema.       Full passive ROM of the R hip, knee, ankle and toes Pt refuses to try Active ROM stating that he cannot  Lymphadenopathy:    He has no cervical adenopathy.  Neurological: He is alert. He exhibits normal muscle tone. GCS eye subscore is 4. GCS verbal subscore is 5. GCS motor subscore is 6.  Reflex Scores:      Patellar reflexes are 2+ on the right side and 2+ on the left side.      Achilles reflexes are 2+ on the right side and 2+ on the left side.      Pt uncooperative with neuro exam stating that he is unable to move his leg, but laughs when he says that.   States subjective loss of sensation in the R foot on sharp dull exam    Skin: Skin is warm and dry. No rash noted. He is not diaphoretic. No erythema.     Psychiatric: He has a normal mood and affect.    ED Course  Procedures (including critical care time)  Labs Reviewed - No data to display Dg Lumbar Spine Complete  06/24/2012  *RADIOLOGY REPORT*  Clinical Data: leg pain  LUMBAR SPINE - COMPLETE 4+ VIEW  Comparison: None.  Findings: There is no evidence of lumbar spine fracture.  Alignment is normal.  Intervertebral disc spaces are maintained.  IMPRESSION: Negative exam.   Original Report Authenticated By: Signa Kell, M.D.     Pt re-evaluation with full Neuro exam after exam by Dr Bebe Shaggy:  Speech is clear and goal oriented, follows commands Major Cranial nerves without deficit, no facial droop Normal strength in upper extremities bilaterally including strong and equal grip strength Normal strength in the L leg including dorsiflexion and plantarflexion; pt will hold leg in the air Decreased strength in the L leg including dorsiflexion and plantarflexion - however, pt puts forth poor effort during exam; pt will not hold leg in the  air Sensation to sharp touch intact; pt denies ability to fell hot vs cold on R leg Coordination intact Normal finger to nose and rapid alternating movements Neg modified romberg, no pronator drift DTR's intact bilaterally  1. Leg pain   2. DIABETES MELLITUS, TYPE II   3. HYPERTENSION   4. Complaints of leg weakness   5. Conversion disorder       MDM  Saben Margo Aye presents with complaints of leg weakness for month month and leg pain today.  Patient without urinary/fecal incontinence and able to ambulate in the department.  Inconsistent neurological exams.  Neurological exam repeated 3 times with sparing results each time.   She given 4 of morphine he states it does not help his pain, however he is more ambulatory on his own afterwards.  Since the pain is not acute Do not believe this requires acute imaging. He reports a history of a fall to Dr. Bebe Shaggy therefore we will obtain  x-ray.  Lumbar x-ray without evidence of lumbar spine fracture and with normal alignment.  Patient with a history of similar complaints in October 2013 with a history of multiple negative head MRIs.   Patient evaluated by neurology in October and diagnosed with conversion disorder. Patient will be DC home with Percocet with followup with his primary care physician.  I have also discussed reasons to return immediately to the ER.  Patient expresses understanding and agrees with plan.   Dr. Bebe Shaggy was consulted, evaluated this patient with me and agrees with the plan.     1. Medications: percocet, usual home medications 2. Treatment: rest, drink plenty of fluids, take medication as prescribed 3. Follow Up: Please followup with your primary doctor for discussion of your diagnoses and further evaluation after today's visit; if you do not have a primary care doctor use the resource guide provided to find one     Dierdre Forth, PA-C 06/25/12 0220  Dahlia Client Avner Stroder, PA-C 06/25/12 0221

## 2012-06-24 NOTE — ED Notes (Addendum)
Pt ambulated in hall from room without assistance. Pt using cane. PA aware.

## 2012-06-24 NOTE — ED Notes (Signed)
Patient transported to X-ray 

## 2012-06-24 NOTE — ED Notes (Signed)
Pt reluctant to leave even after being medically cleared. Denied need for wheelchair to lobby even after being offered twice and did not want numbers to taxis.

## 2012-06-24 NOTE — ED Notes (Signed)
Pt c/o right leg weakness for one month. Pt states whole R leg is painful today with a burning sensation. Pt normally ambulates with cane. Pt in wheelchair at triage. Pt denies injury to R leg.

## 2012-06-24 NOTE — ED Provider Notes (Signed)
Pt seen with PA He reports leg weakness but he is not sure when it became worse, though he reports he has had "for months" Denies urinary/'fecal incontinence He is able to ambulate for me.  He is able to flex at right hip/knee and has ankle dorsiflexion on right.  He reports mostly pain in his right leg.  He reports mostly pain in leg with ambulation.  He reports recent fall, will get xray of lumbar spine.  Given that this is not acute today otherwise (has had weakness previously) and no incontinence does not require emergent mri BP 144/79  Pulse 99  Temp 98.5 F (36.9 C) (Oral)  Resp 16  SpO2 100%   Joya Gaskins, MD 06/24/12 2025

## 2012-06-26 NOTE — ED Provider Notes (Signed)
Medical screening examination/treatment/procedure(s) were conducted as a shared visit with non-physician practitioner(s) and myself.  I personally evaluated the patient during the encounter   Joya Gaskins, MD 06/26/12 [id number]

## 2012-07-22 ENCOUNTER — Encounter (HOSPITAL_COMMUNITY): Payer: Self-pay | Admitting: Emergency Medicine

## 2012-07-22 ENCOUNTER — Emergency Department (HOSPITAL_COMMUNITY)
Admission: EM | Admit: 2012-07-22 | Discharge: 2012-07-22 | Disposition: A | Discharge status: Home / Self Care | Payer: Self-pay | Attending: Emergency Medicine | Admitting: Emergency Medicine

## 2012-07-22 DIAGNOSIS — Z79899 Other long term (current) drug therapy: Secondary | ICD-10-CM | POA: Insufficient documentation

## 2012-07-22 DIAGNOSIS — E119 Type 2 diabetes mellitus without complications: Secondary | ICD-10-CM | POA: Insufficient documentation

## 2012-07-22 DIAGNOSIS — M79604 Pain in right leg: Secondary | ICD-10-CM

## 2012-07-22 DIAGNOSIS — G8929 Other chronic pain: Secondary | ICD-10-CM | POA: Insufficient documentation

## 2012-07-22 DIAGNOSIS — Z760 Encounter for issue of repeat prescription: Secondary | ICD-10-CM | POA: Insufficient documentation

## 2012-07-22 DIAGNOSIS — F17200 Nicotine dependence, unspecified, uncomplicated: Secondary | ICD-10-CM | POA: Insufficient documentation

## 2012-07-22 DIAGNOSIS — M79609 Pain in unspecified limb: Secondary | ICD-10-CM | POA: Insufficient documentation

## 2012-07-22 MED ORDER — TRAMADOL HCL 50 MG PO TABS: 50.0000 mg | ORAL_TABLET | Freq: Four times a day (QID) | ORAL | Status: DC | PRN

## 2012-07-22 MED ORDER — TRAMADOL HCL 50 MG PO TABS
50.0000 mg | ORAL_TABLET | Freq: Once | ORAL | Status: AC
  Administered 2012-07-22: 50 mg via ORAL
  Filled 2012-07-22: qty 1

## 2012-07-22 NOTE — ED Provider Notes (Signed)
Medical screening examination/treatment/procedure(s) were performed by non-physician practitioner and as supervising physician I was immediately available for consultation/collaboration.   Shatia Sindoni Y. Namrata Dangler, MD 07/22/12 1539 

## 2012-07-22 NOTE — ED Provider Notes (Signed)
History     CSN: [id number]  Arrival date & time 07/22/12  1222   First MD Initiated Contact with Patient 07/22/12 1236      Chief Complaint  Patient presents with  . Medication Refill    (Consider location/radiation/quality/duration/timing/severity/associated sxs/prior treatment) Patient is a 51 y.o. male presenting with leg pain. The history is provided by the patient.  Leg Pain  The incident occurred more than 1 week ago. Associated symptoms comments: Chronic right leg pain "they told me was from my diabetes". He was seen a month ago for same and given a prescription for pain medication which he did not fill due to financial constraints. He tried to fill it today but the pharmacy would not allow it so he returned here. No new or changed symptoms..    Past Medical History  Diagnosis Date  . Diabetes mellitus     History reviewed. No pertinent past surgical history.  History reviewed. No pertinent family history.  History  Substance Use Topics  . Smoking status: Current Every Day Smoker    Types: Cigarettes  . Smokeless tobacco: Not on file  . Alcohol Use: Yes     Comment: occassional      Review of Systems  Constitutional: Negative for fever and chills.  HENT: Negative.   Respiratory: Negative.   Cardiovascular: Negative.   Gastrointestinal: Negative.   Musculoskeletal: Negative.        See HPI  Skin: Negative.   Neurological: Negative.     Allergies  Review of patient's allergies indicates no known allergies.  Home Medications   Current Outpatient Rx  Name  Route  Sig  Dispense  Refill  . LISINOPRIL-HYDROCHLOROTHIAZIDE 20-12.5 MG PO TABS   Oral   Take 1 tablet by mouth daily.         Marland Kitchen METFORMIN HCL 1000 MG PO TABS   Oral   Take 1 tablet (1,000 mg total) by mouth 2 (two) times daily.   30 tablet   0   . OXYCODONE-ACETAMINOPHEN 5-325 MG PO TABS   Oral   Take 2 tablets by mouth every 4 (four) hours as needed for pain.   20 tablet   0      BP 134/72  Pulse 90  Temp 98.7 F (37.1 C) (Oral)  Resp 18  SpO2 100%  Physical Exam  Constitutional: He is oriented to person, place, and time. He appears well-developed and well-nourished.  Neck: Normal range of motion.  Cardiovascular:       Distal pulses are 2+ in right LE.  Pulmonary/Chest: Effort normal.  Musculoskeletal: Normal range of motion.       Right leg without swelling or discoloration.He is ambulatory and has a full ROM right leg.  Neurological: He is alert and oriented to person, place, and time.  Skin: Skin is warm and dry.  Psychiatric: He has a normal mood and affect.    ED Course  Procedures (including critical care time)  Labs Reviewed - No data to display No results found.   No diagnosis found. 1. Chronic right leg pain    MDM  Chronic pain without new finding. Encouraged PCP follow up and will Rx Ultram.        Arnoldo Hooker, PA-C 07/22/12 1314

## 2012-07-22 NOTE — ED Notes (Signed)
Patient states that he is here because his rx ran out of time and the pharmacy would not fill the rx. The patient reports that he would like the rx to be rewritten

## 2013-12-22 DIAGNOSIS — R531 Weakness: Secondary | ICD-10-CM

## 2013-12-22 HISTORY — DX: Weakness: R53.1

## 2013-12-26 ENCOUNTER — Inpatient Hospital Stay (HOSPITAL_COMMUNITY)
Admission: EM | Admit: 2013-12-26 | Discharge: 2014-01-02 | DRG: 280 | Disposition: A | Discharge status: Skilled Nursing Facility | Payer: Self-pay | Attending: Internal Medicine | Admitting: Internal Medicine

## 2013-12-26 ENCOUNTER — Encounter (HOSPITAL_COMMUNITY): Payer: Self-pay | Admitting: Anesthesiology

## 2013-12-26 ENCOUNTER — Ambulatory Visit (HOSPITAL_COMMUNITY): Admit: 2013-12-26 | Payer: Self-pay | Admitting: Cardiovascular Disease

## 2013-12-26 ENCOUNTER — Emergency Department (HOSPITAL_COMMUNITY): Payer: Medicaid Other

## 2013-12-26 ENCOUNTER — Encounter (HOSPITAL_COMMUNITY)
Admission: EM | Disposition: A | Discharge status: Skilled Nursing Facility | Payer: Self-pay | Source: Home / Self Care | Attending: Internal Medicine

## 2013-12-26 ENCOUNTER — Encounter (HOSPITAL_COMMUNITY): Payer: Self-pay | Admitting: Radiology

## 2013-12-26 DIAGNOSIS — E785 Hyperlipidemia, unspecified: Secondary | ICD-10-CM | POA: Diagnosis present

## 2013-12-26 DIAGNOSIS — S0003XA Contusion of scalp, initial encounter: Secondary | ICD-10-CM | POA: Diagnosis present

## 2013-12-26 DIAGNOSIS — S14121A Central cord syndrome at C1 level of cervical spinal cord, initial encounter: Secondary | ICD-10-CM | POA: Diagnosis present

## 2013-12-26 DIAGNOSIS — Z5900 Homelessness unspecified: Secondary | ICD-10-CM

## 2013-12-26 DIAGNOSIS — F1410 Cocaine abuse, uncomplicated: Secondary | ICD-10-CM | POA: Diagnosis present

## 2013-12-26 DIAGNOSIS — S0083XA Contusion of other part of head, initial encounter: Secondary | ICD-10-CM | POA: Diagnosis present

## 2013-12-26 DIAGNOSIS — I219 Acute myocardial infarction, unspecified: Secondary | ICD-10-CM

## 2013-12-26 DIAGNOSIS — I2510 Atherosclerotic heart disease of native coronary artery without angina pectoris: Secondary | ICD-10-CM

## 2013-12-26 DIAGNOSIS — F1910 Other psychoactive substance abuse, uncomplicated: Secondary | ICD-10-CM

## 2013-12-26 DIAGNOSIS — I213 ST elevation (STEMI) myocardial infarction of unspecified site: Secondary | ICD-10-CM | POA: Diagnosis present

## 2013-12-26 DIAGNOSIS — E1142 Type 2 diabetes mellitus with diabetic polyneuropathy: Secondary | ICD-10-CM | POA: Diagnosis present

## 2013-12-26 DIAGNOSIS — F17200 Nicotine dependence, unspecified, uncomplicated: Secondary | ICD-10-CM | POA: Diagnosis present

## 2013-12-26 DIAGNOSIS — E119 Type 2 diabetes mellitus without complications: Secondary | ICD-10-CM

## 2013-12-26 DIAGNOSIS — IMO0002 Reserved for concepts with insufficient information to code with codable children: Secondary | ICD-10-CM | POA: Diagnosis present

## 2013-12-26 DIAGNOSIS — E669 Obesity, unspecified: Secondary | ICD-10-CM | POA: Diagnosis present

## 2013-12-26 DIAGNOSIS — E1149 Type 2 diabetes mellitus with other diabetic neurological complication: Secondary | ICD-10-CM | POA: Diagnosis present

## 2013-12-26 DIAGNOSIS — W19XXXA Unspecified fall, initial encounter: Secondary | ICD-10-CM | POA: Diagnosis present

## 2013-12-26 DIAGNOSIS — R4182 Altered mental status, unspecified: Secondary | ICD-10-CM

## 2013-12-26 DIAGNOSIS — I959 Hypotension, unspecified: Secondary | ICD-10-CM | POA: Diagnosis present

## 2013-12-26 DIAGNOSIS — R531 Weakness: Secondary | ICD-10-CM

## 2013-12-26 DIAGNOSIS — IMO0001 Reserved for inherently not codable concepts without codable children: Secondary | ICD-10-CM

## 2013-12-26 DIAGNOSIS — Z79899 Other long term (current) drug therapy: Secondary | ICD-10-CM

## 2013-12-26 DIAGNOSIS — F1020 Alcohol dependence, uncomplicated: Secondary | ICD-10-CM | POA: Diagnosis present

## 2013-12-26 DIAGNOSIS — G8190 Hemiplegia, unspecified affecting unspecified side: Secondary | ICD-10-CM | POA: Diagnosis present

## 2013-12-26 DIAGNOSIS — M47812 Spondylosis without myelopathy or radiculopathy, cervical region: Secondary | ICD-10-CM | POA: Diagnosis present

## 2013-12-26 DIAGNOSIS — I7389 Other specified peripheral vascular diseases: Secondary | ICD-10-CM | POA: Diagnosis present

## 2013-12-26 DIAGNOSIS — S1093XA Contusion of unspecified part of neck, initial encounter: Secondary | ICD-10-CM

## 2013-12-26 DIAGNOSIS — E1169 Type 2 diabetes mellitus with other specified complication: Secondary | ICD-10-CM | POA: Diagnosis present

## 2013-12-26 DIAGNOSIS — I201 Angina pectoris with documented spasm: Secondary | ICD-10-CM | POA: Diagnosis present

## 2013-12-26 DIAGNOSIS — I252 Old myocardial infarction: Secondary | ICD-10-CM

## 2013-12-26 DIAGNOSIS — I10 Essential (primary) hypertension: Secondary | ICD-10-CM | POA: Diagnosis present

## 2013-12-26 DIAGNOSIS — Z6834 Body mass index (BMI) 34.0-34.9, adult: Secondary | ICD-10-CM

## 2013-12-26 HISTORY — DX: Old myocardial infarction: I25.2

## 2013-12-26 HISTORY — PX: LEFT HEART CATHETERIZATION WITH CORONARY ANGIOGRAM: SHX5451

## 2013-12-26 HISTORY — DX: Essential (primary) hypertension: I10

## 2013-12-26 LAB — BASIC METABOLIC PANEL
BUN: 10 mg/dL (ref 6–23)
CALCIUM: 8.7 mg/dL (ref 8.4–10.5)
CO2: 22 meq/L (ref 19–32)
Chloride: 101 mEq/L (ref 96–112)
Creatinine, Ser: 1.04 mg/dL (ref 0.50–1.35)
GFR calc Af Amer: >90 mL/min (ref >90)
GFR calc non Af Amer: 79 mL/min — ABNORMAL LOW (ref >90)
Glucose, Bld: 231 mg/dL — ABNORMAL HIGH (ref 70–99)
POTASSIUM: 3.6 meq/L — AB (ref 3.7–5.3)
SODIUM: 140 meq/L (ref 137–147)

## 2013-12-26 LAB — DIFFERENTIAL
BASOS ABS: 0.1 10*3/uL (ref 0.0–0.1)
Basophils Relative: 1 % (ref 0–1)
EOS PCT: 2 % (ref 0–5)
Eosinophils Absolute: 0.2 10*3/uL (ref 0.0–0.7)
LYMPHS ABS: 2.9 10*3/uL (ref 0.7–4.0)
LYMPHS PCT: 40 % (ref 12–46)
MONO ABS: 0.5 10*3/uL (ref 0.1–1.0)
MONOS PCT: 7 % (ref 3–12)
NEUTROS ABS: 3.7 10*3/uL (ref 1.7–7.7)
Neutrophils Relative %: 50 % (ref 43–77)

## 2013-12-26 LAB — CBC
HEMATOCRIT: 38.6 % — AB (ref 39.0–52.0)
HEMOGLOBIN: 13.1 g/dL (ref 13.0–17.0)
MCH: 28.5 pg (ref 26.0–34.0)
MCHC: 33.9 g/dL (ref 30.0–36.0)
MCV: 84.1 fL (ref 78.0–100.0)
Platelets: 200 10*3/uL (ref 150–400)
RBC: 4.59 MIL/uL (ref 4.22–5.81)
RDW: 15.1 % (ref 11.5–15.5)
WBC: 7.3 10*3/uL (ref 4.0–10.5)

## 2013-12-26 LAB — RAPID URINE DRUG SCREEN, HOSP PERFORMED
Amphetamines: NOT DETECTED
BENZODIAZEPINES: NOT DETECTED
Barbiturates: NOT DETECTED
COCAINE: POSITIVE — AB
OPIATES: NOT DETECTED
TETRAHYDROCANNABINOL: NOT DETECTED

## 2013-12-26 LAB — TROPONIN I
Troponin I: <0.3 ng/mL (ref <0.30)
Troponin I: <0.3 ng/mL (ref <0.30)

## 2013-12-26 LAB — APTT: APTT: 27 s (ref 24–37)

## 2013-12-26 LAB — GLUCOSE, CAPILLARY
GLUCOSE-CAPILLARY: 171 mg/dL — AB (ref 70–99)
Glucose-Capillary: 220 mg/dL — ABNORMAL HIGH (ref 70–99)
Glucose-Capillary: 197 mg/dL — ABNORMAL HIGH (ref 70–99)
Glucose-Capillary: 138 mg/dL — ABNORMAL HIGH (ref 70–99)
Glucose-Capillary: 147 mg/dL — ABNORMAL HIGH (ref 70–99)

## 2013-12-26 LAB — MRSA PCR SCREENING: MRSA BY PCR: NEGATIVE

## 2013-12-26 LAB — PROTIME-INR
INR: 1.03 (ref 0.00–1.49)
Prothrombin Time: 13.3 seconds (ref 11.6–15.2)

## 2013-12-26 LAB — ETHANOL: Alcohol, Ethyl (B): 274 mg/dL — ABNORMAL HIGH (ref 0–11)

## 2013-12-26 SURGERY — LEFT HEART CATHETERIZATION WITH CORONARY ANGIOGRAM
Anesthesia: Choice | Laterality: Bilateral

## 2013-12-26 MED ORDER — SODIUM CHLORIDE 0.9 % IV SOLN
INTRAVENOUS | Status: DC
  Administered 2013-12-26: 05:00:00 via INTRAVENOUS

## 2013-12-26 MED ORDER — VERAPAMIL HCL 2.5 MG/ML IV SOLN
INTRAVENOUS | Status: AC
  Filled 2013-12-26: qty 2

## 2013-12-26 MED ORDER — ASPIRIN 300 MG RE SUPP
300.0000 mg | RECTAL | Status: AC
  Filled 2013-12-26: qty 1

## 2013-12-26 MED ORDER — SIMVASTATIN 20 MG PO TABS
20.0000 mg | ORAL_TABLET | Freq: Every day | ORAL | Status: DC
  Administered 2013-12-26 – 2014-01-01 (×7): 20 mg via ORAL
  Filled 2013-12-26 (×8): qty 1

## 2013-12-26 MED ORDER — ASPIRIN 81 MG PO CHEW
324.0000 mg | CHEWABLE_TABLET | ORAL | Status: AC
  Administered 2013-12-26: 324 mg via ORAL
  Filled 2013-12-26: qty 4

## 2013-12-26 MED ORDER — NITROGLYCERIN 0.4 MG SL SUBL: 0.4000 mg | SUBLINGUAL_TABLET | SUBLINGUAL | Status: DC | PRN

## 2013-12-26 MED ORDER — HEPARIN (PORCINE) IN NACL 2-0.9 UNIT/ML-% IJ SOLN
INTRAMUSCULAR | Status: AC
  Filled 2013-12-26: qty 1000

## 2013-12-26 MED ORDER — LIDOCAINE HCL (PF) 1 % IJ SOLN
INTRAMUSCULAR | Status: AC
  Filled 2013-12-26: qty 30

## 2013-12-26 MED ORDER — LISINOPRIL 10 MG PO TABS
10.0000 mg | ORAL_TABLET | Freq: Every day | ORAL | Status: DC
  Administered 2013-12-26 – 2014-01-02 (×8): 10 mg via ORAL
  Filled 2013-12-26 (×8): qty 1

## 2013-12-26 MED ORDER — NIFEDIPINE ER 30 MG PO TB24
30.0000 mg | ORAL_TABLET | Freq: Every day | ORAL | Status: DC
  Administered 2013-12-26 – 2014-01-02 (×8): 30 mg via ORAL
  Filled 2013-12-26 (×8): qty 1

## 2013-12-26 MED ORDER — SODIUM CHLORIDE 0.9 % IV SOLN
Freq: Once | INTRAVENOUS | Status: AC
  Administered 2013-12-26: 02:00:00 via INTRAVENOUS

## 2013-12-26 MED ORDER — ASPIRIN EC 81 MG PO TBEC
81.0000 mg | DELAYED_RELEASE_TABLET | Freq: Every day | ORAL | Status: DC
  Administered 2013-12-27 – 2014-01-02 (×7): 81 mg via ORAL
  Filled 2013-12-26 (×7): qty 1

## 2013-12-26 MED ORDER — HEPARIN SODIUM (PORCINE) 5000 UNIT/ML IJ SOLN
5000.0000 [IU] | Freq: Three times a day (TID) | INTRAMUSCULAR | Status: DC
  Administered 2013-12-26 – 2014-01-02 (×21): 5000 [IU] via SUBCUTANEOUS
  Filled 2013-12-26 (×25): qty 1

## 2013-12-26 MED ORDER — INSULIN ASPART 100 UNIT/ML ~~LOC~~ SOLN
0.0000 [IU] | Freq: Three times a day (TID) | SUBCUTANEOUS | Status: DC
  Administered 2013-12-26: 3 [IU] via SUBCUTANEOUS
  Administered 2013-12-26: 2 [IU] via SUBCUTANEOUS
  Administered 2013-12-26: 3 [IU] via SUBCUTANEOUS
  Administered 2013-12-27: 5 [IU] via SUBCUTANEOUS
  Administered 2013-12-27: 3 [IU] via SUBCUTANEOUS
  Administered 2013-12-27: 2 [IU] via SUBCUTANEOUS
  Administered 2013-12-28 – 2013-12-29 (×6): 3 [IU] via SUBCUTANEOUS
  Administered 2013-12-30: 5 [IU] via SUBCUTANEOUS
  Administered 2013-12-30 – 2014-01-01 (×6): 3 [IU] via SUBCUTANEOUS
  Administered 2014-01-01: 2 [IU] via SUBCUTANEOUS
  Administered 2014-01-01: 3 [IU] via SUBCUTANEOUS
  Administered 2014-01-02: 2 [IU] via SUBCUTANEOUS

## 2013-12-26 MED ORDER — ACETAMINOPHEN 325 MG PO TABS
650.0000 mg | ORAL_TABLET | ORAL | Status: DC | PRN
  Administered 2013-12-27: 650 mg via ORAL
  Filled 2013-12-26: qty 2

## 2013-12-26 MED ORDER — THIAMINE HCL 100 MG/ML IJ SOLN
Freq: Once | INTRAVENOUS | Status: AC
  Administered 2013-12-26: 04:00:00 via INTRAVENOUS
  Filled 2013-12-26: qty 1000

## 2013-12-26 MED ORDER — HEPARIN SODIUM (PORCINE) 1000 UNIT/ML IJ SOLN
INTRAMUSCULAR | Status: AC
  Filled 2013-12-26: qty 1

## 2013-12-26 NOTE — ED Notes (Signed)
Rapid response at the bedside. 

## 2013-12-26 NOTE — Consult Note (Signed)
NEURO HOSPITALIST CONSULT NOTE    Reason for Consult: bilateral hand weakness  HPI:                                                                                                                                          [name] is an 53 y.o. male h/o cocaine abuse, HTN, tobacco abuse, DM who presented to [facility] on 12/26/2013 with complaints of whole body numbness and weakness. Per note he had been drinking and using cocaine the night before and fell .  He was found down face in grass but conscious by paramedics. On arrival he was noted to have BP 84/40. EKG with ST elevation lateral leads with inferior ST depression. patietn went under a heart catheterization which was negative. patient states after falling he has noted he is unable to move his fingers and wrist bilaterally.  He denies any sensation changes. He also notes he is having weakness in his left leg which is new. Patient is a poor historian and gives very little information. He denies any sensory level, any bowel or bladder incontinence.    Past Medical History  Diagnosis Date  . Diabetes mellitus   . Hypertension     History reviewed. No pertinent past surgical history.  Family History  Problem Relation Age of Onset  . Hypertension Mother   . Hypertension Father      Social History:  reports that he has been smoking Cigarettes.  He has been smoking about 0.00 packs per day. He does not have any smokeless tobacco history on file. He reports that he drinks alcohol. He reports that he uses illicit drugs ("Crack" cocaine).  No Known Allergies  MEDICATIONS:                                                                                                                     Prior to Admission:  Prescriptions prior to admission  Medication Sig Dispense Refill  . lisinopril-hydrochlorothiazide (PRINZIDE,ZESTORETIC) 20-12.5 MG per tablet Take 1 tablet by mouth daily.      . metFORMIN  (GLUCOPHAGE) 1000 MG tablet Take 1 tablet (1,000 mg total) by mouth 2 (two) times daily.  30 tablet  0  . oxyCODONE-acetaminophen (PERCOCET/ROXICET) 5-325 MG per tablet Take 2 tablets  by mouth every 4 (four) hours as needed for pain.  20 tablet  0  . traMADol (ULTRAM) 50 MG tablet Take 1 tablet (50 mg total) by mouth every 6 (six) hours as needed for pain.  15 tablet  0   Scheduled: . [START ON 12/27/2013] aspirin EC  81 mg Oral Daily  . heparin  5,000 Units Subcutaneous 3 times per day  . insulin aspart  0-15 Units Subcutaneous TID WC  . lisinopril  10 mg Oral Daily  . NIFEdipine  30 mg Oral Daily  . simvastatin  20 mg Oral q1800     ROS:                                                                                                                                       History obtained from the patient  General ROS: negative for - chills, fatigue, fever, night sweats, weight gain or weight loss Psychological ROS: negative for - behavioral disorder, hallucinations, memory difficulties, mood swings or suicidal ideation Ophthalmic ROS: negative for - blurry vision, double vision, eye pain or loss of vision ENT ROS: negative for - epistaxis, nasal discharge, oral lesions, sore throat, tinnitus or vertigo Allergy and Immunology ROS: negative for - hives or itchy/watery eyes Hematological and Lymphatic ROS: negative for - bleeding problems, bruising or swollen lymph nodes Endocrine ROS: negative for - galactorrhea, hair pattern changes, polydipsia/polyuria or temperature intolerance Respiratory ROS: negative for - cough, hemoptysis, shortness of breath or wheezing Cardiovascular ROS: negative for - chest pain, dyspnea on exertion, edema or irregular heartbeat Gastrointestinal ROS: negative for - abdominal pain, diarrhea, hematemesis, nausea/vomiting or stool incontinence Genito-Urinary ROS: negative for - dysuria, hematuria, incontinence or urinary frequency/urgency Musculoskeletal ROS:  negative for - joint swelling or muscular weakness Neurological ROS: as noted in HPI Dermatological ROS: negative for rash and skin lesion changes   Blood pressure 130/62, pulse 86, temperature 98.6 F (37 C), temperature source Oral, resp. rate 18, height 6\' 2"  (1.88 m), weight 127.007 kg (280 lb), SpO2 96.00%.   Neurologic Examination:                                                                                                      Mental Status: Alert, oriented, thought content appropriate.  Speech fluent without evidence of aphasia.  Able to follow 3 step commands without difficulty. Cranial Nerves: II: visual fields grossly normal, pupils equal, round, reactive to light and accommodation III,IV, VI: ptosis not present, extraocular  muscles extra-ocular motions intact bilaterally V,VII: smile symmetric, facial light touch sensation normal bilaterally VIII: hearing normal bilaterally IX,X: gag reflex present XI: trapezius strength/neck flexion strength normal bilaterally XII: tongue strength normal  Motor: Right : Upper extremity     Left:     Upper extremity 5/5 deltoid        5/5 deltoid 4/5 tricep (poor effort and give way weakness)  4/5 tricep (poor effort and give way weakness) 5/5 biceps       5/5 biceps  2/5wrist flexion (poor effort and give way weakness)  2/5 wrist flexion 3/5 wrist extension (poor effort and give way weakness) 3/5 wrist extension 0/5 hand grip       0/5 hand grip  Lower extremity     Lower extremity 5/5 hip flexor      3/5 hip flexor (poor effort and give way weakness) 5/5 hip adductors     5/5 hip adductors 5/5 hip abductors     5/5 hip abductors 5/5 quadricep      5/5 quadriceps  5/5 hamstrings     5/5 hamstrings 5/5 plantar flexion       3/5 plantar flexion (poor effort and give way weakness) 5/5 plantar extension     3/5 plantar extension (poor effort and give way weakness) Tone and bulk:normal tone throughout; no atrophy noted Sensory:  Pinprick and light decreased from feet to mid calf. Proprioception intact and decreased vibratory sensation from foot to knee cap.  Deep Tendon Reflexes:  Right: Upper Extremity   Left: Upper extremity   biceps (C-5 to C-6) 2/4   biceps (C-5 to C-6) 2/4 tricep (C7) 2/4    triceps (C7) 2/4 Brachioradialis (C6) 2/4  Brachioradialis (C6) 2/4  Lower Extremity Lower Extremity  quadriceps (L-2 to L-4) 2/4   quadriceps (L-2 to L-4) 2/4 Achilles (S1) 2/4   Achilles (S1) 2/4 Plantars: Right: downgoing   Left: upgoing Cerebellar: normal finger-to-nose, unable to test heel to shin due to patient not taking part in exam  Lab Results: Basic Metabolic Panel:  Recent Labs Lab 12/26/13 0216  NA 140  K 3.6*  CL 101  CO2 22  GLUCOSE 231*  BUN 10  CREATININE 1.04  CALCIUM 8.7    Liver Function Tests: No results found for this basename: AST, ALT, ALKPHOS, BILITOT, PROT, ALBUMIN,  in the last 168 hours No results found for this basename: LIPASE, AMYLASE,  in the last 168 hours No results found for this basename: AMMONIA,  in the last 168 hours  CBC:  Recent Labs Lab 12/26/13 0216  WBC 7.3  NEUTROABS 3.7  HGB 13.1  HCT 38.6*  MCV 84.1  PLT 200    Cardiac Enzymes:  Recent Labs Lab 12/26/13 1200  TROPONINI <0.30    Lipid Panel: No results found for this basename: CHOL, TRIG, HDL, CHOLHDL, VLDL, LDLCALC,  in the last 168 hours  CBG:  Recent Labs Lab 12/26/13 0557 12/26/13 0811 12/26/13 1210  GLUCAP 220* 197* 171*    Microbiology: Results for orders placed during the hospital encounter of 12/26/13  MRSA PCR SCREENING     Status: None   Collection Time    12/26/13  3:50 AM      Result Value Ref Range Status   MRSA by PCR NEGATIVE  NEGATIVE Final   Comment:            The GeneXpert MRSA Assay (FDA     approved for NASAL specimens     only), is  one component of a     comprehensive MRSA colonization     surveillance program. It is not     intended to diagnose  MRSA     infection nor to guide or     monitor treatment for     MRSA infections.    Coagulation Studies:  Recent Labs  12/26/13 0216  LABPROT 13.3  INR 1.03    Imaging: Ct Head Wo Contrast  12/26/2013   CLINICAL DATA:  Fall with numbness.  EXAM: CT HEAD WITHOUT CONTRAST  CT CERVICAL SPINE WITHOUT CONTRAST  TECHNIQUE: Multidetector CT imaging of the head and cervical spine was performed following the standard protocol without intravenous contrast. Multiplanar CT image reconstructions of the cervical spine were also generated.  COMPARISON:  04/27/2012  FINDINGS: CT HEAD FINDINGS  Skull and Sinuses:No acute fracture or destructive process. Bilateral middle concha bullosa causing anterior nasal cavity effacement. There is scarring in the posterior scalp as seen previously. There are also multiple rounded subcutaneous nodules in the occipital region, consistent with dermal inclusion cysts.  Orbits: No acute abnormality.  Brain: No evidence of acute abnormality, such as acute infarction, hemorrhage, hydrocephalus, or mass lesion/mass effect. Age advanced generalized brain atrophy. 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  CT CERVICAL SPINE FINDINGS  Negative for acute fracture or subluxation. No prevertebral edema. No gross cervical canal hematoma.  Degenerative disc narrowing at C5-6, C6-7, and C7-T1. Small posterior disc osteophyte complexes present at these levels. There is uncovertebral spurring, most notable on the left at C7-T1 where there is mild osseous foraminal narrowing.  IMPRESSION: 1. No acute intracranial or cervical spine injury. 2. Generalized brain atrophy. 3. Congenital absence of the septum pellucidum.   Electronically Signed   By: Jorje Guild M.D.   On: 12/26/2013 02:40   Ct Cervical Spine Wo Contrast  12/26/2013   CLINICAL DATA:  Fall with numbness.  EXAM: CT HEAD WITHOUT CONTRAST  CT CERVICAL SPINE WITHOUT CONTRAST   TECHNIQUE: Multidetector CT imaging of the head and cervical spine was performed following the standard protocol without intravenous contrast. Multiplanar CT image reconstructions of the cervical spine were also generated.  COMPARISON:  04/27/2012  FINDINGS: CT HEAD FINDINGS  Skull and Sinuses:No acute fracture or destructive process. Bilateral middle concha bullosa causing anterior nasal cavity effacement. There is scarring in the posterior scalp as seen previously. There are also multiple rounded subcutaneous nodules in the occipital region, consistent with dermal inclusion cysts.  Orbits: No acute abnormality.  Brain: No evidence of acute abnormality, such as acute infarction, hemorrhage, hydrocephalus, or mass lesion/mass effect. Age advanced generalized brain atrophy. 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  CT CERVICAL SPINE FINDINGS  Negative for acute fracture or subluxation. No prevertebral edema. No gross cervical canal hematoma.  Degenerative disc narrowing at C5-6, C6-7, and C7-T1. Small posterior disc osteophyte complexes present at these levels. There is uncovertebral spurring, most notable on the left at C7-T1 where there is mild osseous foraminal narrowing.  IMPRESSION: 1. No acute intracranial or cervical spine injury. 2. Generalized brain atrophy. 3. Congenital absence of the septum pellucidum.   Electronically Signed   By: Jorje Guild M.D.   On: 12/26/2013 02:40    Etta Quill PA-C [facility] [phone]  12/26/2013, 3:00 PM   Patient seen and examined.  Clinical course and management discussed.  Necessary edits performed.  I agree with the above.  Assessment and  plan of care developed and discussed below.     Assessment/Plan: 53 YO male found face down after fall by paramedics after being intoxicated.  While in hospital patient complained of bilateral weak grip and left leg weakness. Exam shows poor  effort and therefore patient appears to have a generalized weakness.  CT of the cervical spine on admission has been reviewed and shows no cord abnormalities.  MRI would be more sensitive.  With drug abuse can not rule out emboli as well.  Patient with evidence of a PN on examination that is likely related to his DM.  This may have been worsened with prolonged pressure from his fall.  Further work up recommended.    Recommendations: 1.  MRI of the brain without contrast 2.  MRI of the cervical spine 3.  Myasthenia panel, vitamin B1, vitamin B12, magnesium, phosphorus 4.  PT, OT consults.     Alexis Goodell, MD [facility] [phone]  12/26/2013  4:54 PM

## 2013-12-26 NOTE — ED Notes (Signed)
Dr. Sharol Given at the bedside. Hold heparin.

## 2013-12-26 NOTE — ED Notes (Signed)
Normal saline started at 142mL/hr.

## 2013-12-26 NOTE — Progress Notes (Signed)
TR Band removed and drsg applied  Positive reverse allens. Rt radial pulse 2t. Level 0. Pt continues to need reminders to not pull with rt hand.

## 2013-12-26 NOTE — ED Provider Notes (Signed)
CSN: [id number]     Arrival date & time 12/26/13  0154 History   First MD Initiated Contact with Patient 12/26/13 0202     Chief Complaint  Patient presents with  . Code STEMI     (Consider location/radiation/quality/duration/timing/severity/associated sxs/prior Treatment) HPI 53 yo male presents to the ER via EMS after being found prone in the woods.  Pt reports he is unsure how he fell or how long he was lying there.  Code STEMI called in route due to concerning EKG.  EMS obtained EKG as workup for possible syncope.  Pt denies chest pain, sob, nausea.  He is complaining of diffuse whole body numbness and weakness.  Contusion to forehead noted.  Pt moving all extremities, but does not follow commands well.  Pt admits to drinking alcohol tonight.  Denies cocaine use tonight, but has used in the past.  Cannot identify when he last used.  Pt arrived on backboard, did not fit in ccollar.  Cardiology fellow, Dr Elias Else at bedside upon arrival.  [facility] of HTN, DM.  Denies previous heart problems.  Pt very poor historian.  Initial bp for ems 80s/40s, improved with ns bolus. Past Medical History  Diagnosis Date  . Diabetes mellitus    No past surgical history on file. No family history on file. History  Substance Use Topics  . Smoking status: Current Every Day Smoker    Types: Cigarettes  . Smokeless tobacco: Not on file  . Alcohol Use: Yes     Comment: occassional    Review of Systems  Unable to perform ROS: Other    intoxication  Allergies  Review of patient's allergies indicates no known allergies.  Home Medications   Prior to Admission medications   Medication Sig Start Date End Date Taking? Authorizing Provider  lisinopril-hydrochlorothiazide (PRINZIDE,ZESTORETIC) 20-12.5 MG per tablet Take 1 tablet by mouth daily.    Historical Provider, MD  metFORMIN (GLUCOPHAGE) 1000 MG tablet Take 1 tablet (1,000 mg total) by mouth 2 (two) times daily. 04/04/12 04/04/13  Ruthell Rummage Dammen, PA-C   oxyCODONE-acetaminophen (PERCOCET/ROXICET) 5-325 MG per tablet Take 2 tablets by mouth every 4 (four) hours as needed for pain. 06/24/12   Hannah Muthersbaugh, PA-C  traMADol (ULTRAM) 50 MG tablet Take 1 tablet (50 mg total) by mouth every 6 (six) hours as needed for pain. 07/22/12   Shari A Upstill, PA-C   BP 121/66  Pulse 79  Temp(Src) 96.2 F (35.7 C) (Axillary)  Resp 15  SpO2 98% Physical Exam  Nursing note and vitals reviewed. Constitutional: He appears well-developed and well-nourished.  Morbidly obese male, NAD, somnolent but arousable  HENT:  Head: Normocephalic and atraumatic.  Right Ear: External ear normal.  Left Ear: External ear normal.  Nose: Nose normal.  Mouth/Throat: Oropharynx is clear and moist.  Large contusion to forehead  Eyes: Conjunctivae and EOM are normal. Pupils are equal, round, and reactive to light.  Neck: Normal range of motion. Neck supple. No JVD present. No tracheal deviation present. No thyromegaly present.  Cardiovascular: Normal rate, regular rhythm, normal heart sounds and intact distal pulses.  Exam reveals no gallop and no friction rub.   No murmur heard. Pulmonary/Chest: Effort normal and breath sounds normal. No stridor. No respiratory distress. He has no wheezes. He has no rales. He exhibits no tenderness.  Abdominal: Soft. Bowel sounds are normal. He exhibits no distension and no mass. There is no tenderness. There is no rebound and no guarding.  Musculoskeletal: He exhibits no edema  and no tenderness.  Lymphadenopathy:    He has no cervical adenopathy.  Neurological: He is alert. He has normal reflexes. No cranial nerve deficit. He exhibits normal muscle tone. Coordination abnormal.  Oriented to self, that he is in a hospital.  Pt has inconsistent sensory exam, reports only dull sensation to touch throughout, but at other times reports no sensation to left lower extremity.  Moving all extremities, but will not follow commands consistently.   +drop arm test, hitting face with both arms.    Skin: Skin is warm and dry. No rash noted. No erythema. No pallor.  Psychiatric:  Intoxicated, poor insight and judgment    ED Course  Procedures (including critical care time)  CRITICAL CARE Performed by: Kalman Drape Total critical care time: 30 min Critical care time was exclusive of separately billable procedures and treating other patients. Critical care was necessary to treat or prevent imminent or life-threatening deterioration. Critical care was time spent personally by me on the following activities: development of treatment plan with patient and/or surrogate as well as nursing, discussions with consultants, evaluation of patient's response to treatment, examination of patient, obtaining history from patient or surrogate, ordering and performing treatments and interventions, ordering and review of laboratory studies, ordering and review of radiographic studies, pulse oximetry and re-evaluation of patient's condition.  Labs Review Labs Reviewed  CBC  DIFFERENTIAL  PROTIME-INR  APTT  BASIC METABOLIC PANEL  URINE RAPID DRUG SCREEN (HOSP PERFORMED)  ETHANOL    Imaging Review No results found.   EKG Interpretation   Date/Time:  Friday December 26 2013 02:00:47 EDT Ventricular Rate:  77 PR Interval:  220 QRS Duration: 121 QT Interval:  434 QTC Calculation: 491 R Axis:   -59 Text Interpretation:  Sinus rhythm Prolonged PR interval Nonspecific IVCD  with LAD Lateral infarct, acute (LAD) Borderline ST elevation, anterior  leads new ST elevations in 1, AVL with st depression in III, AVF  concerning for STEMI Confirmed by Leory Allinson  MD, Manual Navarra ([id number]) on 12/26/2013  2:25:33 AM      MDM   Final diagnoses:  STEMI (ST elevation myocardial infarction)    53 yo male found down in the woods, contusion to forehead.  EKG concerning for STEMI, pt does not complain of chest pain, only generalized weakness, numbness.  He reports drinking  heavily tonight.  Prior to heparin, to have head, cspine ct scan.  Taken emergently to cath lab from CT scan.    Kalman Drape, MD 12/26/13 [phone]

## 2013-12-26 NOTE — CV Procedure (Signed)
      Cardiac Catheterization Operative Report  [name] [id number] 6/5/20152:56 AM Pcp Not In System  Procedure Performed:  1. Left Heart Catheterization 2. Selective Coronary Angiography 3. Left ventricular angiogram  Operator: Lauree Chandler, MD  Primary cardiologist: Aundra Dubin  Arterial access site:  Right radial artery.   Indication: 53 yo male with history of DM, cocaine abuse with use tonight, alcohol abuse to ED via EMS with altered mental status appearing intoxicated. EKG with ST elevation lateral leads with inferior ST depression. Pt with hematoma anterior forehead. CT head with no acute intracranial process. No chest pain but with diffuse weakness. This presentation is very similar to presentation in 2012 at which time his EKG looked similar but normalized the next day. Cath in 2012 with mild CAD.                                       Procedure Details: The risks, benefits, complications, treatment options, and expected outcomes were discussed with the patient. Emergency consent placed on chart. The patient was brought to the cath lab after IV hydration was begun and oral premedication was given. The patient was not sedated as he was intoxicated. The right wrist was prepped and draped in a sterile fashion. 1% lidocaine was used for local anesthesia. Using the modified Seldinger access technique, a 5 French sheath was placed in the right radial artery. 3 mg Verapamil was given through the sheath. 6000 units IV heparin was given. Standard diagnostic catheters were used to perform selective coronary angiography. A pigtail catheter was used to perform a left ventricular angiogram. The sheath was removed from the right radial artery and a Terumo hemostasis band was applied at the arteriotomy site on the right wrist.    There were no immediate complications. The patient was taken to the recovery area in stable condition.   Hemodynamic Findings: Central aortic pressure:  119/68 Left ventricular pressure: 116/13/17  Angiographic Findings:  Left main: No obstructive disease.   Left Anterior Descending Artery: Large caliber vessel that courses to the apex. No obstructive disease.   Circumflex Artery: Large caliber vessel with large OM branch. No obstructive disease. Small caliber intermediate branch with 50-60% distal stenosis. There is excellent flow down this small intermediate branch.   Right Coronary Artery: Moderate caliber vessel with mid 20% stenosis.   Left Ventricular Angiogram: LVEF=55-60%  Impression: 1. Mild disease in the RCA 2. Moderate disease in the small caliber intermediate branch 3. Normal LV systolic function  Recommendations: Medical management of CAD. He will be admitted to the stepdown ICU. Hopefully altered mental status will resolve when his acute intoxication resolves.        Complications:  None. The patient tolerated the procedure well.

## 2013-12-26 NOTE — ED Notes (Signed)
Backboard removed with by Dr. Sharol Given with assist.

## 2013-12-26 NOTE — ED Notes (Signed)
Per EMS, fell out in woods behind [address] on [address].  Numbness all over body, he was unsure of why he passed out.  Could not fit C-collar. Hematoma to forehead.  Arrived with block and back board.  BP 84/40. 350 of normal saline given. 130/100 now. Dr. Sharol Given at the bedside.

## 2013-12-26 NOTE — H&P (Signed)
History and Physical  Patient ID: [name] MRN: [id number], SOB: 07/17/1961 53 y.o. Date of Encounter: 12/26/2013, 3:00 AM  Primary Physician: Pcp Not In System Primary Cardiologist: Previously seen by Dr. Aundra Dubin  Chief Complaint: numbness  HPI: 53 y.o. male w/ PMHx significant for h/o cocaine abuse, HTN, tobacco abuse, DM who presented to [facility] on 12/26/2013 with complaints of whole body numbness and weakness. History is limited as patient appears to be drunk. He reports that him and his "brothers" were drinking and likely using cocaine this evening. Was found down but not unconscious in the grass by paramedics. Unclear if tripped or had syncope. Hematoma to forehead and minor scrapping on his knees.  Reports homelessness.  During transport, was hypotensive to 84/40 and given fluids with good response.   Denies chest pain. Continues to report numbness and weakness though is not localizing.  Stat head CT negative for acute process, negative cervical spine.   EKG revealed NSR with STE in I and AVL and V2-V2 with depressions in III and AVF. Labs pending.  STEMI alert called and pt urgently went to the cath lab --> no obstructive disease.   Past Medical History  Diagnosis Date  . Diabetes mellitus      Surgical History: No past surgical history on file.   Home Meds: Prior to Admission medications   Medication Sig Start Date End Date Taking? Authorizing Provider  lisinopril-hydrochlorothiazide (PRINZIDE,ZESTORETIC) 20-12.5 MG per tablet Take 1 tablet by mouth daily.    Historical Provider, MD  metFORMIN (GLUCOPHAGE) 1000 MG tablet Take 1 tablet (1,000 mg total) by mouth 2 (two) times daily. 04/04/12 04/04/13  Ruthell Rummage Dammen, PA-C  oxyCODONE-acetaminophen (PERCOCET/ROXICET) 5-325 MG per tablet Take 2 tablets by mouth every 4 (four) hours as needed for pain. 06/24/12   Hannah Muthersbaugh, PA-C  traMADol (ULTRAM) 50 MG tablet Take 1 tablet (50 mg total) by mouth every 6  (six) hours as needed for pain. 07/22/12   Shari A Upstill, PA-C    Allergies: No Known Allergies  History   Social History  . Marital Status: Divorced    Spouse Name: N/A    Number of Children: N/A  . Years of Education: N/A   Occupational History  . Not on file.   Social History Main Topics  . Smoking status: Current Every Day Smoker    Types: Cigarettes  . Smokeless tobacco: Not on file  . Alcohol Use: Yes     Comment: occassional  . Drug Use: No  . Sexual Activity:    Other Topics Concern  . Not on file   Social History Narrative  . No narrative on file     No family history on file.  Review of Systems: Unable to complete due to intoxication  Labs:   Lab Results  Component Value Date   WBC 7.3 12/26/2013   HGB 13.1 12/26/2013   HCT 38.6* 12/26/2013   MCV 84.1 12/26/2013   PLT 200 12/26/2013   No results found for this basename: NA, K, CL, CO2, BUN, CREATININE, CALCIUM, LABALBU, PROT, BILITOT, ALKPHOS, ALT, AST, GLUCOSE,  in the last 168 hours No results found for this basename: CKTOTAL, CKMB, TROPONINI,  in the last 72 hours Lab Results  Component Value Date   CHOL 205* 02/23/2011   HDL 48 02/23/2011   LDLCALC 91 02/23/2011   TRIG 330* 02/23/2011   No results found for this basename: DDIMER    Radiology/Studies:  Ct Head Wo Contrast  12/26/2013   CLINICAL DATA:  Fall with numbness.  EXAM: CT HEAD WITHOUT CONTRAST  CT CERVICAL SPINE WITHOUT CONTRAST  TECHNIQUE: Multidetector CT imaging of the head and cervical spine was performed following the standard protocol without intravenous contrast. Multiplanar CT image reconstructions of the cervical spine were also generated.  COMPARISON:  04/27/2012  FINDINGS: CT HEAD FINDINGS  Skull and Sinuses:No acute fracture or destructive process. Bilateral middle concha bullosa causing anterior nasal cavity effacement. There is scarring in the posterior scalp as seen previously. There are also multiple rounded subcutaneous nodules in the  occipital region, consistent with dermal inclusion cysts.  Orbits: No acute abnormality.  Brain: No evidence of acute abnormality, such as acute infarction, hemorrhage, hydrocephalus, or mass lesion/mass effect. Age advanced generalized brain atrophy. 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  CT CERVICAL SPINE FINDINGS  Negative for acute fracture or subluxation. No prevertebral edema. No gross cervical canal hematoma.  Degenerative disc narrowing at C5-6, C6-7, and C7-T1. Small posterior disc osteophyte complexes present at these levels. There is uncovertebral spurring, most notable on the left at C7-T1 where there is mild osseous foraminal narrowing.  IMPRESSION: 1. No acute intracranial or cervical spine injury. 2. Generalized brain atrophy. 3. Congenital absence of the septum pellucidum.   Electronically Signed   By: Jorje Guild M.D.   On: 12/26/2013 02:40   Ct Cervical Spine Wo Contrast  12/26/2013   CLINICAL DATA:  Fall with numbness.  EXAM: CT HEAD WITHOUT CONTRAST  CT CERVICAL SPINE WITHOUT CONTRAST  TECHNIQUE: Multidetector CT imaging of the head and cervical spine was performed following the standard protocol without intravenous contrast. Multiplanar CT image reconstructions of the cervical spine were also generated.  COMPARISON:  04/27/2012  FINDINGS: CT HEAD FINDINGS  Skull and Sinuses:No acute fracture or destructive process. Bilateral middle concha bullosa causing anterior nasal cavity effacement. There is scarring in the posterior scalp as seen previously. There are also multiple rounded subcutaneous nodules in the occipital region, consistent with dermal inclusion cysts.  Orbits: No acute abnormality.  Brain: No evidence of acute abnormality, such as acute infarction, hemorrhage, hydrocephalus, or mass lesion/mass effect. Age advanced generalized brain atrophy. Congenital absence of the septum pellucidum with ventriculomegaly and  diffuse corpus callosum thinning.  CT CERVICAL SPINE FINDINGS  Negative for acute fracture or subluxation. No prevertebral edema. No gross cervical canal hematoma.  Degenerative disc narrowing at C5-6, C6-7, and C7-T1. Small posterior disc osteophyte complexes present at these levels. There is uncovertebral spurring, most notable on the left at C7-T1 where there is mild osseous foraminal narrowing.  IMPRESSION: 1. No acute intracranial or cervical spine injury. 2. Generalized brain atrophy. 3. Congenital absence of the septum pellucidum.   Electronically Signed   By: Jorje Guild M.D.   On: 12/26/2013 02:40     EKG: sinus, STE in I, AVL V2, V3 and depression in III and avf. Interestingly, similar to EKG from 04/29/2011 presentation (also a STEMI) but changes resolved on discharge EKG.  Physical Exam: Blood pressure 121/66, pulse 79, temperature 96.2 F (35.7 C), temperature source Axillary, resp. rate 15, height 6\' 2"  (1.88 m), weight 127.007 kg (280 lb), SpO2 98.00%. General: Well developed in no acute distress, covered in grass Head: small hematoma to forehead Neck: Supple. Negative for carotid bruits. JVD not elevated. Lungs: Clear bilaterally to auscultation without wheezes, rales, or rhonchi. Breathing is unlabored. Heart: RRR with S1 S2. No murmurs, rubs, or gallops appreciated. Abdomen:  Soft, non-tender, non-distended with normoactive bowel sounds. No rebound/guarding. No obvious abdominal masses. Msk:  Strength and tone appear normal for age. Extremities: trace edema bilateral Neuro: answers questions reasonably well but clearly intoxicated, moving all extremities and able to sense pain in all extremities but exam limited by cooperation.   Cath reviewed- no obstructive disease, EF normal  ASSESSMENT AND PLAN:  1. STEMI --> negative for obstruction by cath (possibly vasospasm from drug toxicity?) 2. Acute intoxication 3. Global numbness and weakness 4. Diabetes 5. Hyperlipidemia 6.  Homelessness 7. Potential cocaine abuse 8. Hematoma, neg head CT  53 y.o. male w/ PMHx significant for h/o cocaine abuse, HTN, tobacco abuse, DM who presented to [facility] on 12/26/2013 with complaints of whole body numbness and weakness --> found to have ST elevations in I, avl with reciprocal changes. Interestingly, had very similar presentation in 2012 and had negative cath and it appears the EKG changes are dynamic, suggestive of vasospastic process?  Not complaining of chest symptoms. Repeat EKG for resolution of ST changes. Continue aspirin, avoid beta blockers. Will start ACEI if blood pressure needed. Start low dose statin. Will need to address ability to afford these medications.  Global numbness and weakness- negative head CT (except for chronic volume loss- ?ETOH related). Check lytes, glucose, TSH. Encouragingly, had very similar presentation previously. If localizes or if neuro exam changes, will get neuro involved but at this point, anticipate improvement with time.   Urine drug screen. As above, avoid beta blockers. CIWA will be needed if he has extended stay in the hospital. Thiamine/folate banana bag.  Social work to address homelessness.  Prophylaxis: subq lovenox    Signed, Cletus Gash MD 12/26/2013, 3:00 AM

## 2013-12-26 NOTE — ED Notes (Signed)
No complaints of chest pain. "numb everywhere"

## 2013-12-26 NOTE — Progress Notes (Signed)
Patient Name: [name] Date of Encounter: 12/26/2013  Active Problems:   Altered mental status   STEMI (ST elevation myocardial infarction)   Length of Stay: 0  SUBJECTIVE  Has a terrible hangover, but no angina or dyspnea. ST elevation has resolved.  CURRENT MEDS . [START ON 12/27/2013] aspirin EC  81 mg Oral Daily  . heparin  5,000 Units Subcutaneous 3 times per day  . insulin aspart  0-15 Units Subcutaneous TID WC  . lisinopril  10 mg Oral Daily  . NIFEdipine  30 mg Oral Daily  . simvastatin  20 mg Oral q1800    OBJECTIVE   Intake/Output Summary (Last 24 hours) at 12/26/13 1059 Last data filed at 12/26/13 0600  Gross per 24 hour  Intake    400 ml  Output      0 ml  Net    400 ml   Filed Weights   12/26/13 0204  Weight: 280 lb (127.007 kg)    PHYSICAL EXAM Filed Vitals:   12/26/13 0500 12/26/13 0600 12/26/13 0700 12/26/13 0857  BP: 97/51 138/84 104/63 145/75  Pulse: 87 92 85   Temp:   98.3 F (36.8 C)   TempSrc:   Oral   Resp: 0 18 19   Height:      Weight:      SpO2: 92% 100% 97%    General: Alert, oriented x3, no distress Head: large mid frontal hematoma and left orbital ecchymosis, PERRL, EOMI, no exophtalmos or lid lag, no myxedema, no xanthelasma; normal ears, nose and oropharynx Neck: normal jugular venous pulsations and no hepatojugular reflux; brisk carotid pulses without delay and no carotid bruits Chest: clear to auscultation, no signs of consolidation by percussion or palpation, normal fremitus, symmetrical and full respiratory excursions Cardiovascular: normal position and quality of the apical impulse, regular rhythm, normal first and second heart sounds, no rubs or gallops, no murmur Abdomen: no tenderness or distention, no masses by palpation, no abnormal pulsatility or arterial bruits, normal bowel sounds, no hepatosplenomegaly Extremities: no clubbing, cyanosis or edema; 2+ radial, ulnar and brachial pulses bilaterally; 2+ right  femoral, posterior tibial and dorsalis pedis pulses; 2+ left femoral, posterior tibial and dorsalis pedis pulses; no subclavian or femoral bruits Neurological: weak flexor grip and extensor muscles symmetrically in the upper extremities. Some degree of small hand muscle atrophy?  LABS  CBC  Recent Labs  12/26/13 0216  WBC 7.3  NEUTROABS 3.7  HGB 13.1  HCT 38.6*  MCV 84.1  PLT 409   Basic Metabolic Panel  Recent Labs  12/26/13 0216  NA 140  K 3.6*  CL 101  CO2 22  GLUCOSE 231*  BUN 10  CREATININE 1.04  CALCIUM 8.7    Radiology Studies Imaging results have been reviewed and Ct Head Wo Contrast  12/26/2013   CLINICAL DATA:  Fall with numbness.  EXAM: CT HEAD WITHOUT CONTRAST  CT CERVICAL SPINE WITHOUT CONTRAST  TECHNIQUE: Multidetector CT imaging of the head and cervical spine was performed following the standard protocol without intravenous contrast. Multiplanar CT image reconstructions of the cervical spine were also generated.  COMPARISON:  04/27/2012  FINDINGS: CT HEAD FINDINGS  Skull and Sinuses:No acute fracture or destructive process. Bilateral middle concha bullosa causing anterior nasal cavity effacement. There is scarring in the posterior scalp as seen previously. There are also multiple rounded subcutaneous nodules in the occipital region, consistent with dermal inclusion cysts.  Orbits: No acute abnormality.  Brain: No evidence of acute abnormality,  such as acute infarction, hemorrhage, hydrocephalus, or mass lesion/mass effect. Age advanced generalized brain atrophy. 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  CT CERVICAL SPINE FINDINGS  Negative for acute fracture or subluxation. No prevertebral edema. No gross cervical canal hematoma.  Degenerative disc narrowing at C5-6, C6-7, and C7-T1. Small posterior disc osteophyte complexes present at these levels. There is uncovertebral spurring, most notable on the left  at C7-T1 where there is mild osseous foraminal narrowing.  IMPRESSION: 1. No acute intracranial or cervical spine injury. 2. Generalized brain atrophy. 3. Congenital absence of the septum pellucidum.   Electronically Signed   By: Jorje Guild M.D.   On: 12/26/2013 02:40   Ct Cervical Spine Wo Contrast  12/26/2013   CLINICAL DATA:  Fall with numbness.  EXAM: CT HEAD WITHOUT CONTRAST  CT CERVICAL SPINE WITHOUT CONTRAST  TECHNIQUE: Multidetector CT imaging of the head and cervical spine was performed following the standard protocol without intravenous contrast. Multiplanar CT image reconstructions of the cervical spine were also generated.  COMPARISON:  04/27/2012  FINDINGS: CT HEAD FINDINGS  Skull and Sinuses:No acute fracture or destructive process. Bilateral middle concha bullosa causing anterior nasal cavity effacement. There is scarring in the posterior scalp as seen previously. There are also multiple rounded subcutaneous nodules in the occipital region, consistent with dermal inclusion cysts.  Orbits: No acute abnormality.  Brain: No evidence of acute abnormality, such as acute infarction, hemorrhage, hydrocephalus, or mass lesion/mass effect. Age advanced generalized brain atrophy. 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  CT CERVICAL SPINE FINDINGS  Negative for acute fracture or subluxation. No prevertebral edema. No gross cervical canal hematoma.  Degenerative disc narrowing at C5-6, C6-7, and C7-T1. Small posterior disc osteophyte complexes present at these levels. There is uncovertebral spurring, most notable on the left at C7-T1 where there is mild osseous foraminal narrowing.  IMPRESSION: 1. No acute intracranial or cervical spine injury. 2. Generalized brain atrophy. 3. Congenital absence of the septum pellucidum.   Electronically Signed   By: Jorje Guild M.D.   On: 12/26/2013 02:40    TELE NSR  ECG NSR, ST elevation has  resolved, LAFB, inferior T wave inversion  ASSESSMENT AND PLAN  1.Coronary vasospasm with ST elevation, cocaine induced - resolved. Enzymes pending. This is the second time this has happened and I told him he is lucky to be alive. Complete abstinence is recommended.  2. Moderate upper extremity diparesis? suggests cervicomedullary junction lesion, but no major abnormalities on CT spine. Cerebral atrophy (ETOH abuse?) and congenital absence of septum pellucidum unlikely to pertain to this abnormality. Degenerative cervical spine changes are mild, without clear cord impingement. Looks like he fell on his face, but no imaging evidence of cervical spine trauma. Have requested a neuro consult. D/w Dr. Doy Mince. 3. Polysubstance abuse   Sanda Klein, MD, [facility] [phone] office [phone] pager 12/26/2013 10:59 AM

## 2013-12-27 ENCOUNTER — Inpatient Hospital Stay (HOSPITAL_COMMUNITY): Payer: Medicaid Other

## 2013-12-27 DIAGNOSIS — IMO0002 Reserved for concepts with insufficient information to code with codable children: Secondary | ICD-10-CM | POA: Diagnosis present

## 2013-12-27 LAB — PHOSPHORUS: Phosphorus: 2.7 mg/dL (ref 2.3–4.6)

## 2013-12-27 LAB — GLUCOSE, CAPILLARY
GLUCOSE-CAPILLARY: 128 mg/dL — AB (ref 70–99)
Glucose-Capillary: 164 mg/dL — ABNORMAL HIGH (ref 70–99)
Glucose-Capillary: 230 mg/dL — ABNORMAL HIGH (ref 70–99)
Glucose-Capillary: 158 mg/dL — ABNORMAL HIGH (ref 70–99)

## 2013-12-27 LAB — FOLATE: Folate: 12.2 ng/mL

## 2013-12-27 LAB — MAGNESIUM: Magnesium: 1.6 mg/dL (ref 1.5–2.5)

## 2013-12-27 LAB — VITAMIN B12: Vitamin B-12: 319 pg/mL (ref 211–911)

## 2013-12-27 NOTE — Progress Notes (Addendum)
Subjective: Patient continues to complain of weakness.    Objective: Current vital signs: BP 134/61  Pulse 81  Temp(Src) 98.9 F (37.2 C) (Oral)  Resp 11  Ht 6\' 2"  (1.88 m)  Wt 127.007 kg (280 lb)  BMI 35.93 kg/m2  SpO2 98% Vital signs in last 24 hours: Temp:  [98.6 F (37 C)-100 F (37.8 C)] 98.9 F (37.2 C) (06/06 0800) Pulse Rate:  [81-97] 81 (06/06 0600) Resp:  [11-24] 11 (06/06 0800) BP: (124-176)/(55-77) 134/61 mmHg (06/06 0800) SpO2:  [96 %-99 %] 98 % (06/06 0800)  Intake/Output from previous day: 06/05 0701 - 06/06 0700 In: 1600 [I.V.:1600] Out: 2950 [Urine:2950] Intake/Output this shift:   Nutritional status: Clear Liquid  Neurologic Exam: Mental Status:  Alert, oriented, thought content appropriate. Speech fluent without evidence of aphasia. Able to follow 3 step commands without difficulty.  Cranial Nerves:  II: visual fields grossly normal, pupils equal, round, reactive to light and accommodation  III,IV, VI: ptosis not present, extraocular muscles extra-ocular motions intact bilaterally  V,VII: smile symmetric, facial light touch sensation normal bilaterally  VIII: hearing normal bilaterally  IX,X: gag reflex present  XI: trapezius strength/neck flexion strength normal bilaterally  XII: tongue strength normal  Motor:  Lifts both arms above head but weakly.  Hand grip 3-4/5 bilaterally.  Lifts both legs off the bed and maintains but not against resistance.  Unclear amount of effort.   Sensory: Pinprick and light decreased from feet to mid calf. Proprioception intact and decreased vibratory sensation from foot to knee cap.  Deep Tendon Reflexes:  2+ throughout Plantars:  Right: downgoing   Left: upgoing  Cerebellar:  normal finger-to-nose  Lab Results: Basic Metabolic Panel:  Recent Labs Lab 12/26/13 0216 12/27/13 0312  NA 140  --   K 3.6*  --   CL 101  --   CO2 22  --   GLUCOSE 231*  --   BUN 10  --   CREATININE 1.04  --   CALCIUM 8.7  --    MG  --  1.6  PHOS  --  2.7    Liver Function Tests: No results found for this basename: AST, ALT, ALKPHOS, BILITOT, PROT, ALBUMIN,  in the last 168 hours No results found for this basename: LIPASE, AMYLASE,  in the last 168 hours No results found for this basename: AMMONIA,  in the last 168 hours  CBC:  Recent Labs Lab 12/26/13 0216  WBC 7.3  NEUTROABS 3.7  HGB 13.1  HCT 38.6*  MCV 84.1  PLT 200    Cardiac Enzymes:  Recent Labs Lab 12/26/13 1200 12/26/13 1620 12/26/13 2250  TROPONINI <0.30 <0.30 <0.30    Lipid Panel: No results found for this basename: CHOL, TRIG, HDL, CHOLHDL, VLDL, LDLCALC,  in the last 168 hours  CBG:  Recent Labs Lab 12/26/13 0811 12/26/13 1210 12/26/13 1631 12/26/13 2114 12/27/13 0811  GLUCAP 197* 171* 138* 147* 164*    Microbiology: Results for orders placed during the hospital encounter of 12/26/13  MRSA PCR SCREENING     Status: None   Collection Time    12/26/13  3:50 AM      Result Value Ref Range Status   MRSA by PCR NEGATIVE  NEGATIVE Final   Comment:            The GeneXpert MRSA Assay (FDA     approved for NASAL specimens     only), is one component of a     comprehensive MRSA  colonization     surveillance program. It is not     intended to diagnose MRSA     infection nor to guide or     monitor treatment for     MRSA infections.    Coagulation Studies:  Recent Labs  12/26/13 0216  LABPROT 13.3  INR 1.03    Imaging: Ct Head Wo Contrast  12/26/2013   CLINICAL DATA:  Fall with numbness.  EXAM: CT HEAD WITHOUT CONTRAST  CT CERVICAL SPINE WITHOUT CONTRAST  TECHNIQUE: Multidetector CT imaging of the head and cervical spine was performed following the standard protocol without intravenous contrast. Multiplanar CT image reconstructions of the cervical spine were also generated.  COMPARISON:  04/27/2012  FINDINGS: CT HEAD FINDINGS  Skull and Sinuses:No acute fracture or destructive process. Bilateral middle concha  bullosa causing anterior nasal cavity effacement. There is scarring in the posterior scalp as seen previously. There are also multiple rounded subcutaneous nodules in the occipital region, consistent with dermal inclusion cysts.  Orbits: No acute abnormality.  Brain: No evidence of acute abnormality, such as acute infarction, hemorrhage, hydrocephalus, or mass lesion/mass effect. Age advanced generalized brain atrophy. 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  CT CERVICAL SPINE FINDINGS  Negative for acute fracture or subluxation. No prevertebral edema. No gross cervical canal hematoma.  Degenerative disc narrowing at C5-6, C6-7, and C7-T1. Small posterior disc osteophyte complexes present at these levels. There is uncovertebral spurring, most notable on the left at C7-T1 where there is mild osseous foraminal narrowing.  IMPRESSION: 1. No acute intracranial or cervical spine injury. 2. Generalized brain atrophy. 3. Congenital absence of the septum pellucidum.   Electronically Signed   By: Jorje Guild M.D.   On: 12/26/2013 02:40   Ct Cervical Spine Wo Contrast  12/26/2013   CLINICAL DATA:  Fall with numbness.  EXAM: CT HEAD WITHOUT CONTRAST  CT CERVICAL SPINE WITHOUT CONTRAST  TECHNIQUE: Multidetector CT imaging of the head and cervical spine was performed following the standard protocol without intravenous contrast. Multiplanar CT image reconstructions of the cervical spine were also generated.  COMPARISON:  04/27/2012  FINDINGS: CT HEAD FINDINGS  Skull and Sinuses:No acute fracture or destructive process. Bilateral middle concha bullosa causing anterior nasal cavity effacement. There is scarring in the posterior scalp as seen previously. There are also multiple rounded subcutaneous nodules in the occipital region, consistent with dermal inclusion cysts.  Orbits: No acute abnormality.  Brain: No evidence of acute abnormality, such as acute  infarction, hemorrhage, hydrocephalus, or mass lesion/mass effect. Age advanced generalized brain atrophy. 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  CT CERVICAL SPINE FINDINGS  Negative for acute fracture or subluxation. No prevertebral edema. No gross cervical canal hematoma.  Degenerative disc narrowing at C5-6, C6-7, and C7-T1. Small posterior disc osteophyte complexes present at these levels. There is uncovertebral spurring, most notable on the left at C7-T1 where there is mild osseous foraminal narrowing.  IMPRESSION: 1. No acute intracranial or cervical spine injury. 2. Generalized brain atrophy. 3. Congenital absence of the septum pellucidum.   Electronically Signed   By: Jorje Guild M.D.   On: 12/26/2013 02:40    Medications:  I have reviewed the patient's current medications. Scheduled: . aspirin EC  81 mg Oral Daily  . heparin  5,000 Units Subcutaneous 3 times per day  . insulin aspart  0-15 Units Subcutaneous TID WC  . lisinopril  10 mg  Oral Daily  . NIFEdipine  30 mg Oral Daily  . simvastatin  20 mg Oral q1800    Assessment/Plan: Patient remains weak.  Unclear how much attributed to effort. MRI's pending.  Magnesium, Phosphorus are normal.    Recommendations: 1.  Will follow up remaining lab work and MRI's. 2.  PT   LOS: 1 day   Alexis Goodell, MD [facility] [phone] 12/27/2013  9:00 AM  Addendum: MRI of the cervical spine reviewed and shows cord edema at the C5-6 disc level.  This likely represents a contusion from the fall.  Neurosurgery was contacted and did not feel that immediate surgery was indicated at this time.  Recommendations: 1.  Continue PT  Alexis Goodell, MD [facility] [phone]

## 2013-12-27 NOTE — Progress Notes (Signed)
Patient Name: [name] Date of Encounter: 12/27/2013  Active Problems:   Altered mental status   STEMI (ST elevation myocardial infarction)   Weakness   Length of Stay: 1  SUBJECTIVE  No cardiac complaints. Still complains of weakness.  CURRENT MEDS . aspirin EC  81 mg Oral Daily  . heparin  5,000 Units Subcutaneous 3 times per day  . insulin aspart  0-15 Units Subcutaneous TID WC  . lisinopril  10 mg Oral Daily  . NIFEdipine  30 mg Oral Daily  . simvastatin  20 mg Oral q1800    OBJECTIVE   Intake/Output Summary (Last 24 hours) at 12/27/13 0958 Last data filed at 12/27/13 0500  Gross per 24 hour  Intake   1600 ml  Output   2950 ml  Net  -1350 ml   Filed Weights   12/26/13 0204  Weight: 280 lb (127.007 kg)    PHYSICAL EXAM Filed Vitals:   12/27/13 0200 12/27/13 0400 12/27/13 0600 12/27/13 0800  BP: 137/56 135/77 124/56 134/61  Pulse: 81 83 81 78  Temp:  99.8 F (37.7 C)  98.9 F (37.2 C)  TempSrc:  Oral  Oral  Resp: 23 13 22 11   Height:      Weight:      SpO2: 97% 99% 96% 98%   General: Alert, oriented x3, no distress  Head: large mid frontal hematoma and left orbital ecchymosis, PERRL, EOMI, no exophtalmos or lid lag, no myxedema, no xanthelasma; normal ears, nose and oropharynx  Neck: normal jugular venous pulsations and no hepatojugular reflux; brisk carotid pulses without delay and no carotid bruits  Chest: clear to auscultation, no signs of consolidation by percussion or palpation, normal fremitus, symmetrical and full respiratory excursions  Cardiovascular: normal position and quality of the apical impulse, regular rhythm, normal first and second heart sounds, no rubs or gallops, no murmur  Abdomen: no tenderness or distention, no masses by palpation, no abnormal pulsatility or arterial bruits, normal bowel sounds, no hepatosplenomegaly  Extremities: no clubbing, cyanosis or edema; 2+ radial, ulnar and brachial pulses bilaterally; 2+ right  femoral, posterior tibial and dorsalis pedis pulses; 2+ left femoral, posterior tibial and dorsalis pedis pulses; no subclavian or femoral bruits  Neurological: weak flexor grip and extensor muscles symmetrically in the upper extremities.   LABS  CBC  Recent Labs  12/26/13 0216  WBC 7.3  NEUTROABS 3.7  HGB 13.1  HCT 38.6*  MCV 84.1  PLT 263   Basic Metabolic Panel  Recent Labs  12/26/13 0216 12/27/13 0312  NA 140  --   K 3.6*  --   CL 101  --   CO2 22  --   GLUCOSE 231*  --   BUN 10  --   CREATININE 1.04  --   CALCIUM 8.7  --   MG  --  1.6  PHOS  --  2.7   Liver Function Tests No results found for this basename: AST, ALT, ALKPHOS, BILITOT, PROT, ALBUMIN,  in the last 72 hours No results found for this basename: LIPASE, AMYLASE,  in the last 72 hours Cardiac Enzymes  Recent Labs  12/26/13 1200 12/26/13 1620 12/26/13 2250  TROPONINI <0.30 <0.30 <0.30   Radiology Studies Imaging results have been reviewed and Ct Head Wo Contrast  12/26/2013   CLINICAL DATA:  Fall with numbness.  EXAM: CT HEAD WITHOUT CONTRAST  CT CERVICAL SPINE WITHOUT CONTRAST  TECHNIQUE: Multidetector CT imaging of the head and cervical spine was performed  following the standard protocol without intravenous contrast. Multiplanar CT image reconstructions of the cervical spine were also generated.  COMPARISON:  04/27/2012  FINDINGS: CT HEAD FINDINGS  Skull and Sinuses:No acute fracture or destructive process. Bilateral middle concha bullosa causing anterior nasal cavity effacement. There is scarring in the posterior scalp as seen previously. There are also multiple rounded subcutaneous nodules in the occipital region, consistent with dermal inclusion cysts.  Orbits: No acute abnormality.  Brain: No evidence of acute abnormality, such as acute infarction, hemorrhage, hydrocephalus, or mass lesion/mass effect. Age advanced generalized brain atrophy. 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  CT CERVICAL SPINE FINDINGS  Negative for acute fracture or subluxation. No prevertebral edema. No gross cervical canal hematoma.  Degenerative disc narrowing at C5-6, C6-7, and C7-T1. Small posterior disc osteophyte complexes present at these levels. There is uncovertebral spurring, most notable on the left at C7-T1 where there is mild osseous foraminal narrowing.  IMPRESSION: 1. No acute intracranial or cervical spine injury. 2. Generalized brain atrophy. 3. Congenital absence of the septum pellucidum.   Electronically Signed   By: Jorje Guild M.D.   On: 12/26/2013 02:40   Ct Cervical Spine Wo Contrast  12/26/2013   CLINICAL DATA:  Fall with numbness.  EXAM: CT HEAD WITHOUT CONTRAST  CT CERVICAL SPINE WITHOUT CONTRAST  TECHNIQUE: Multidetector CT imaging of the head and cervical spine was performed following the standard protocol without intravenous contrast. Multiplanar CT image reconstructions of the cervical spine were also generated.  COMPARISON:  04/27/2012  FINDINGS: CT HEAD FINDINGS  Skull and Sinuses:No acute fracture or destructive process. Bilateral middle concha bullosa causing anterior nasal cavity effacement. There is scarring in the posterior scalp as seen previously. There are also multiple rounded subcutaneous nodules in the occipital region, consistent with dermal inclusion cysts.  Orbits: No acute abnormality.  Brain: No evidence of acute abnormality, such as acute infarction, hemorrhage, hydrocephalus, or mass lesion/mass effect. Age advanced generalized brain atrophy. 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  CT CERVICAL SPINE FINDINGS  Negative for acute fracture or subluxation. No prevertebral edema. No gross cervical canal hematoma.  Degenerative disc narrowing at C5-6, C6-7, and C7-T1. Small posterior disc osteophyte complexes present at these levels. There is  uncovertebral spurring, most notable on the left at C7-T1 where there is mild osseous foraminal narrowing.  IMPRESSION: 1. No acute intracranial or cervical spine injury. 2. Generalized brain atrophy. 3. Congenital absence of the septum pellucidum.   Electronically Signed   By: Jorje Guild M.D.   On: 12/26/2013 02:40    TELE nsr  ECG NSR, No residual ST changes  ASSESSMENT AND PLAN No active cardiac problems - resolved cocaine related vasospasm. Negative enzymes. Ongoing neuro work up. May need PT - from cardiac standpoint - complete abstinence from cocaine is the major recommendation.   Sanda Klein, MD, [facility] [phone] office [phone] pager 12/27/2013 9:58 AM

## 2013-12-28 ENCOUNTER — Encounter (HOSPITAL_COMMUNITY): Payer: Self-pay | Admitting: Neurological Surgery

## 2013-12-28 DIAGNOSIS — F1020 Alcohol dependence, uncomplicated: Secondary | ICD-10-CM

## 2013-12-28 DIAGNOSIS — E119 Type 2 diabetes mellitus without complications: Secondary | ICD-10-CM

## 2013-12-28 DIAGNOSIS — F1910 Other psychoactive substance abuse, uncomplicated: Secondary | ICD-10-CM

## 2013-12-28 DIAGNOSIS — R4182 Altered mental status, unspecified: Secondary | ICD-10-CM

## 2013-12-28 DIAGNOSIS — R5381 Other malaise: Secondary | ICD-10-CM

## 2013-12-28 DIAGNOSIS — F1410 Cocaine abuse, uncomplicated: Secondary | ICD-10-CM | POA: Diagnosis present

## 2013-12-28 DIAGNOSIS — I10 Essential (primary) hypertension: Secondary | ICD-10-CM

## 2013-12-28 DIAGNOSIS — R5383 Other fatigue: Secondary | ICD-10-CM

## 2013-12-28 DIAGNOSIS — E669 Obesity, unspecified: Secondary | ICD-10-CM

## 2013-12-28 DIAGNOSIS — I201 Angina pectoris with documented spasm: Secondary | ICD-10-CM

## 2013-12-28 DIAGNOSIS — IMO0002 Reserved for concepts with insufficient information to code with codable children: Secondary | ICD-10-CM

## 2013-12-28 DIAGNOSIS — IMO0001 Reserved for inherently not codable concepts without codable children: Secondary | ICD-10-CM | POA: Diagnosis present

## 2013-12-28 DIAGNOSIS — I219 Acute myocardial infarction, unspecified: Secondary | ICD-10-CM

## 2013-12-28 LAB — LIPID PANEL
CHOL/HDL RATIO: 3.6 ratio
Cholesterol: 204 mg/dL — ABNORMAL HIGH (ref 0–200)
HDL: 56 mg/dL (ref >39)
LDL Cholesterol: 108 mg/dL — ABNORMAL HIGH (ref 0–99)
Triglycerides: 202 mg/dL — ABNORMAL HIGH (ref <150)
VLDL: 40 mg/dL (ref 0–40)

## 2013-12-28 LAB — GLUCOSE, CAPILLARY
GLUCOSE-CAPILLARY: 162 mg/dL — AB (ref 70–99)
GLUCOSE-CAPILLARY: 167 mg/dL — AB (ref 70–99)
Glucose-Capillary: 188 mg/dL — ABNORMAL HIGH (ref 70–99)
Glucose-Capillary: 166 mg/dL — ABNORMAL HIGH (ref 70–99)

## 2013-12-28 NOTE — Consult Note (Signed)
Reason for Consult: Central cord syndrome Referring Physician: Neurologist  [name] is an 53 y.o. male.   HPI:  53 year old male who apparently is homeless and [place]" who reports that he was "intoxicated" when he fell and struck his face. He then had numbness and weakness. He was admitted with ST segment elevation and was felt to be having an MI. His troponins never bumped. This was felt to be vasospasm from cocaine use. He was found to have numbness and weakness and neurology was consulted. MRI of the cervical spine showed signal change in the spinal cord at C5-6 and cervical spondylosis and neurosurgical evaluation was requested. She describes numbness and weakness in the hands. He describes chronic weakness of the left leg, though it is worse now. He has some left-sided low back pain. He has a history of diabetes. He describes some neck pain. It is aching in character and moderate. His symptoms are not progressive.  Past Medical History  Diagnosis Date  . Diabetes mellitus   . Hypertension     History reviewed. No pertinent past surgical history.  No Known Allergies  History  Substance Use Topics  . Smoking status: Current Every Day Smoker    Types: Cigarettes  . Smokeless tobacco: Not on file  . Alcohol Use: Yes     Comment: occassional    Family History  Problem Relation Age of Onset  . Hypertension Mother   . Hypertension Father      Review of Systems  Positive ROS: As above  All other systems have been reviewed and were otherwise negative with the exception of those mentioned in the HPI and as above.  Objective: Vital signs in last 24 hours: Temp:  [98.5 F (36.9 C)-99.2 F (37.3 C)] 98.5 F (36.9 C) (06/07 0800) Pulse Rate:  [65-85] 70 (06/07 0700) Resp:  [7-20] 12 (06/07 0800) BP: (134-163)/(53-83) 134/67 mmHg (06/07 0800) SpO2:  [96 %-100 %] 99 % (06/07 0800) Weight:  [123.106 kg (271 lb 6.4 oz)] 123.106 kg (271 lb 6.4 oz)  (06/07 0300)  General Appearance: Alert, cooperative, no distress, appears stated age Head: Normocephalic, without obvious abnormality, atraumatic Eyes: PERRL, conjunctiva/corneas clear, EOM's intact, eyes are set wide apart      Neck: Supple, symmetrical, trachea midline Lungs:  respirations unlabored Heart: Regular rate and rhythm  NEUROLOGIC:   Mental status: A&O x4, no aphasia, good attention span, Memory and fund of knowledge Motor Exam - he is quite weak in his hand grips. His left hand grip is 1/5, his right hand grip is 2/5, significant weakness of hand intrinsics, wrist extension is 2/5, biceps are 4/5 bilaterally, triceps are 3/5, deltoids are 4/5 bilaterally, he moves his right lower extremity better than his left lower extremity, right lower extremities antigravity, he is not quite antigravity in the left lower extremity Sensory Exam - decreased sensation in the hands Reflexes: Decreased Coordination - not tested Gait - unable to test Balance - unable to test Cranial Nerves: I: smell Not tested  II: visual acuity  OS: na    OD: na   Full to confrontation  II: pupils Equal, round, reactive to light  III,VII: ptosis None  III,IV,VI: extraocular muscles  Full ROM  V: mastication Normal  V: facial light touch sensation  Normal  V,VII: corneal reflex  Present  VII: facial muscle function - upper  Normal  VII: facial muscle function - lower Normal  VIII: hearing Not tested  IX:  soft palate elevation  Normal  IX,X: gag reflex Present  XI: trapezius strength  5/5  XI: sternocleidomastoid strength 5/5  XI: neck flexion strength  5/5  XII: tongue strength  Normal    Data Review Lab Results  Component Value Date   WBC 7.3 12/26/2013   HGB 13.1 12/26/2013   HCT 38.6* 12/26/2013   MCV 84.1 12/26/2013   PLT 200 12/26/2013   Lab Results  Component Value Date   NA 140 12/26/2013   K 3.6* 12/26/2013   CL 101 12/26/2013   CO2 22 12/26/2013   BUN 10 12/26/2013   CREATININE 1.04 12/26/2013    GLUCOSE 231* 12/26/2013   Lab Results  Component Value Date   INR 1.03 12/26/2013    Radiology: Ct Head Wo Contrast  12/26/2013   CLINICAL DATA:  Fall with numbness.  EXAM: CT HEAD WITHOUT CONTRAST  CT CERVICAL SPINE WITHOUT CONTRAST  TECHNIQUE: Multidetector CT imaging of the head and cervical spine was performed following the standard protocol without intravenous contrast. Multiplanar CT image reconstructions of the cervical spine were also generated.  COMPARISON:  04/27/2012  FINDINGS: CT HEAD FINDINGS  Skull and Sinuses:No acute fracture or destructive process. Bilateral middle concha bullosa causing anterior nasal cavity effacement. There is scarring in the posterior scalp as seen previously. There are also multiple rounded subcutaneous nodules in the occipital region, consistent with dermal inclusion cysts.  Orbits: No acute abnormality.  Brain: No evidence of acute abnormality, such as acute infarction, hemorrhage, hydrocephalus, or mass lesion/mass effect. Age advanced generalized brain atrophy. 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  CT CERVICAL SPINE FINDINGS  Negative for acute fracture or subluxation. No prevertebral edema. No gross cervical canal hematoma.  Degenerative disc narrowing at C5-6, C6-7, and C7-T1. Small posterior disc osteophyte complexes present at these levels. There is uncovertebral spurring, most notable on the left at C7-T1 where there is mild osseous foraminal narrowing.  IMPRESSION: 1. No acute intracranial or cervical spine injury. 2. Generalized brain atrophy. 3. Congenital absence of the septum pellucidum.   Electronically Signed   By: Jorje Guild M.D.   On: 12/26/2013 02:40   Ct Cervical Spine Wo Contrast  12/26/2013   CLINICAL DATA:  Fall with numbness.  EXAM: CT HEAD WITHOUT CONTRAST  CT CERVICAL SPINE WITHOUT CONTRAST  TECHNIQUE: Multidetector CT imaging of the head and cervical spine was performed  following the standard protocol without intravenous contrast. Multiplanar CT image reconstructions of the cervical spine were also generated.  COMPARISON:  04/27/2012  FINDINGS: CT HEAD FINDINGS  Skull and Sinuses:No acute fracture or destructive process. Bilateral middle concha bullosa causing anterior nasal cavity effacement. There is scarring in the posterior scalp as seen previously. There are also multiple rounded subcutaneous nodules in the occipital region, consistent with dermal inclusion cysts.  Orbits: No acute abnormality.  Brain: No evidence of acute abnormality, such as acute infarction, hemorrhage, hydrocephalus, or mass lesion/mass effect. Age advanced generalized brain atrophy. 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  CT CERVICAL SPINE FINDINGS  Negative for acute fracture or subluxation. No prevertebral edema. No gross cervical canal hematoma.  Degenerative disc narrowing at C5-6, C6-7, and C7-T1. Small posterior disc osteophyte complexes present at these levels. There is uncovertebral spurring, most notable on the left at C7-T1 where there is mild osseous foraminal narrowing.  IMPRESSION: 1. No acute intracranial or cervical spine injury. 2. Generalized brain atrophy. 3. Congenital  absence of the septum pellucidum.   Electronically Signed   By: Jorje Guild M.D.   On: 12/26/2013 02:40   Mr Brain Wo Contrast  12/27/2013   CLINICAL DATA:  Weakness in the upper and lower extremities. Status post fall.  EXAM: MRI HEAD WITHOUT CONTRAST  TECHNIQUE: Multiplanar, multiecho pulse sequences of the brain and surrounding structures were obtained without intravenous contrast.  COMPARISON:  CT head and cervical spine 12/26/2013. MRI brain 04/27/2012.  FINDINGS: No acute infarct, hemorrhage, or mass lesion is present. Absence of the septum pellucidum is again noted. To moderate generalized atrophy is evident. Multiple cystic lesions are noted  within the scalp. There is some restricted diffusion. These likely represent sebaceous cyst, possible infected.  The ventricles are proportionate to the degree of atrophy. No significant extra-axial fluid collection is present.  Flow is present in the major intracranial arteries. The globes and orbits are intact. Mild mucosal thickening is present in the maxillary sinuses bilaterally.  IMPRESSION: 1. Stable appearance of moderate generalized atrophy. 2. No acute intracranial abnormality. 3. Congenital absence of the septum pellucidum. 4. Multiple cystic lesions within the scalp likely represent sebaceous cysts. The may be infected.   Electronically Signed   By: Lawrence Santiago M.D.   On: 12/27/2013 11:40   Mr Cervical Spine Wo Contrast  12/27/2013   CLINICAL DATA:  Fall.  Weakness of the arms and legs.  EXAM: MRI CERVICAL SPINE WITHOUT CONTRAST  TECHNIQUE: Multiplanar, multisequence MR imaging of the cervical spine was performed. No intravenous contrast was administered.  COMPARISON:  CT of the cervical spine 12/26/2013.  FINDINGS: Focal T2 cord signal abnormality is present posterior to C5 and the C5-6 disc level. This is more prominent left than right. There is no significant expansion of the scratch the there slight expansion of the cord at this level.  Cord signal is otherwise normal to the lowest imaged level, T3-4.  Chronic endplate marrow changes are present at C6-7 and C7-T1. Vertebral body heights and alignment are maintained. The craniocervical junction is within normal limits. Flow is present in the vertebral arteries bilaterally.  C2-3: Mild left-sided uncovertebral and facet spurring is present without significant stenosis.  C3-4: Mild uncovertebral spurring is present bilaterally. There is some facet hypertrophy as well. This results in mild foraminal and central canal narrowing, right greater than left.  C4-5: A leftward disc osteophyte complex is present. There is mild central and left foraminal  narrowing.  C5-6: A leftward disc osteophyte complex is present. This effaces in contacts the left ventral surface the cord. Mild to moderate left and mild right foraminal narrowing is due to uncovertebral spurring.  C6-7: A broad-based disc osteophyte complex is present. There is effacement of the ventral CSF. Mild foraminal narrowing is worse on the left.  C7-T1: Uncovertebral spurring leads to moderate foraminal stenosis bilaterally, worse on the left.  T1-2: Negative.  IMPRESSION: 1. Focal cord signal abnormality posterior to the C5-6 disc level and C5 vertebral body representing cord edema likely secondary to a acute cord injury. There is no associated hemorrhage. 2. Leftward disc osteophyte complex at C5-6 contacts the left ventral surface the cord at the level of the cord signal abnormality. 3. Moderate left and mild right foraminal narrowing at C5-6. 4. No acute osseous abnormality. 5. Mild foraminal and central canal stenosis at C3-4 is worse on the right. 6. Mild central and left foraminal narrowing at C4-5. 7. Mild central and foraminal narrowing bilaterally at C6-7 is worse on the left.  8. Moderate foraminal stenosis bilaterally at C7-T1 due to uncovertebral spurring, worse on the left.   Electronically Signed   By: Lawrence Santiago M.D.   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 This may give him a favorable prognosis for return of some neurologic function over time though he'll likely always have some spasticity and some weakness (myelopathy). His MRI shows signal change in the spinal cord behind L8-9 where he certainly has spondylosis but no ongoing cord compression and moderate stenosis. There is no need for urgent surgical intervention. He could consider decompressive surgery in a few weeks to reduce the canal  narrowing at C5-6 and therefore prevent further injury to the cord it should he fall again and/ or suffer another hyperextension injury. I do not believe this is an absolute indication given the fact that he does not have significant chronic compression of the spinal cord based on his current imaging, this was likely a hyperextension injury. However, I feel most spine surgeons would recommend decompressive surgery at some point. The timing of that surgery remains somewhat of a controversy.   He is going to need physical and occupational therapy and rehabilitation. He does not need to remain in the ICU from a neurological standpoint. He can follow with me in the office 2 weeks after discharge from the rehabilitation center to discuss the merits of surgical intervention. We have briefly discussed all of that today and I have spoken to him at length about the prognosis of this injury, cause of this injury, and the treatment algorithm. I have tried to answer all of his questions to the best of my ability.   Eustace Moore 12/28/2013 10:43 AM

## 2013-12-28 NOTE — Evaluation (Addendum)
Physical Therapy Evaluation Patient Details Name: REFUGIO VANDEVOORDE MRN: [id number] DOB: 23-Jan-1961 Today's Date: 12/28/2013   History of Present Illness  Pt. is a 53 year old homeless male admitted 12/26/13 with STEMI, AMS, acute intoxication, potential cocaine abuse, contusion to forehead.  He was found down in woods, cord edema at C5-6 thought due to his fall with resultant weakness.  NS says no immediate intervention from surgical standpoint.  Neurology requesting PT consult.    Clinical Impression  Pt. Presents to PT with weakness L >R UE and LE  and dyscoordination, decreased functional mobility and gait due to STEMI and cord injury.  He will benefit from acute PT to begin to address his mobility and weakness issues.  I recommend OT consult and have requested a CIR screen to determine if  best venue of care post acute will be CIR or SNF since he is a homeless man (would likely get a longer length of stay at SNF as opposed to CIR which may prove in his best interest).  He says he has family but they would not be willing for him to come stay with them.       Follow Up Recommendations CIR;Supervision for mobility/OOB    Equipment Recommendations  Other (comment) (TBD)    Recommendations for Other Services Rehab consult     Precautions / Restrictions Precautions Precautions: Fall;Cervical;Other (comment) (C5-6 cord edema) Precaution Comments: I instructed pt. in log rolling technique Restrictions Weight Bearing Restrictions: No      Mobility  Bed Mobility Overal bed mobility: Needs Assistance;+2 for physical assistance Bed Mobility: Rolling;Sidelying to Sit;Sit to Supine Rolling: Mod assist Sidelying to sit: +2 for physical assistance;Mod assist     Sit to sidelying: +2 for physical assistance;Mod assist General bed mobility comments: needed assist at shoulders adn LEs  to transition to sitting position at EOB and to return to sidelying   Transfers Overall transfer level:  (not  attempted)                  Ambulation/Gait Ambulation/Gait assistance:  (not attempted)              Stairs            Wheelchair Mobility    Modified Rankin (Stroke Patients Only)       Balance Overall balance assessment: Needs assistance Sitting-balance support: Bilateral upper extremity supported;Feet supported Sitting balance-Leahy Scale: Fair Sitting balance - Comments: posterior lean unless cued to bring trunk over BOS                                     Pertinent Vitals/Pain See vitals tab Pt. Says he has discomfort in low thoracic spine area Vitals stable throughout session    [place] expects to be discharged to:: Unsure (pt. says he as to wait 6 months to go back to shelter)                 Additional Comments: Pt. says he has family but they will not allow him to stay with them post hospitalization    Prior Function Level of Independence: Independent         Comments: Pt. reports he has an active drivers liscense but no car.  Says he is independent as a homeless man     Hand Dominance   Dominant Hand: Right    Extremity/Trunk Assessment   Upper  Extremity Assessment: RUE deficits/detail;LUE deficits/detail RUE Deficits / Details: grossly weakness in R UE L>R.  Fair grip strength; moves UEs throught 1/2 range at shoulders, no resistance given due to cord edema; weaker distally in wrists hands L > R     LUE Deficits / Details: see R UE details for general UE comments   Lower Extremity Assessment: RLE deficits/detail;LLE deficits/detail RLE Deficits / Details: grossly WFL LLE Deficits / Details: pt. with weakness L LE > R LE; generally in 3 to 3-/5 range at knee and ankle/foot  with  increased effort to complete movements compared to R LE  Cervical / Trunk Assessment: Normal  Communication   Communication: No difficulties  Cognition Arousal/Alertness: Awake/alert Behavior During Therapy:  WFL for tasks assessed/performed Overall Cognitive Status: Within Functional Limits for tasks assessed                      General Comments      Exercises        Assessment/Plan    PT Assessment Patient needs continued PT services  PT Diagnosis Difficulty walking;Generalized weakness;Acute pain   PT Problem List Decreased strength;Decreased activity tolerance;Decreased balance;Decreased mobility;Decreased coordination;Decreased knowledge of use of DME;Decreased knowledge of precautions;Impaired sensation;Impaired tone;Pain  PT Treatment Interventions DME instruction;Gait training;Functional mobility training;Therapeutic activities;Therapeutic exercise;Balance training;Patient/family education   PT Goals (Current goals can be found in the Care Plan section) Acute Rehab PT Goals Patient Stated Goal: pt. did not provide his goals PT Goal Formulation: With patient Time For Goal Achievement: 01/11/14 Potential to Achieve Goals: Fair    Frequency Min 4X/week   Barriers to discharge Decreased caregiver support pt. is homeless    Co-evaluation               End of Session   Activity Tolerance: Patient tolerated treatment well Patient left: in bed;with call bell/phone within reach;with bed alarm set Nurse Communication: Mobility status         Time: 0037-0488 PT Time Calculation (min): 18 min   Charges:   PT Evaluation $Initial PT Evaluation Tier I: 1 Procedure PT Treatments $Therapeutic Activity: 8-22 mins   PT G CodesLadona Ridgel 12/28/2013, 11:21 AM Gerlean Ren PT Acute Rehab Services [phone] [place] [phone]

## 2013-12-28 NOTE — Progress Notes (Signed)
[facility] TEAM 1 - Stepdown/ICU TEAM Progress Note  [name] YQM:[id number] DOB: Jan 06, 1961 DOA: 12/26/2013 PCP: Pcp Not In System  Admit HPI / Brief Narrative: 53 y.o.BM PMHx cocaine abuse, HTN, HLD, diabetes type 2,tobacco abuse, who presented to [facility] on 12/26/2013 with complaints of whole body numbness and weakness. History is limited as patient appears to be drunk. He reports that him and his "brothers" were drinking and likely using cocaine this evening. Was found down but not unconscious in the grass by paramedics. Unclear if tripped or had syncope. Hematoma to forehead and minor scrapping on his knees. UDS positive cocaine, EtOH= 274 on admission   HPI/Subjective: 6/7 patient A./O. x4 no acute distress  Assessment/Plan: STEMI  -negative for obstruction by cath (possibly vasospasm from drug toxicity?)   Acute intoxication;  -patient she is positive for cocaine, EtOH= 274 on admission, continue CIWA protocol l -Obtain echocardiogram R./O. alcohol induced cardiomyopathy   Left hemiparesis/spinal cord contusion/ -Counseled patient that this may be permanent however we would not know until he had completed physical therapy, and possible surgery -Patient is to followup with Dr. Sherley Bounds (neurosurgery) within 2 weeks after discharge for evaluation for future surgery  Diabetes Type 2 -Obtain hemoglobin A1c -Obtain lipid panel  -Continue moderate SSI for control -Continue heart healthy/carb modified diet  Hyperlipidemia  -See diabetes type 2  Homelessness  -Social work consult  Polysubstance abuse  - Social work consult inpatient/outpatient rehabilitation  -Patient UDS positive for cocaine, EtOH= 274 on admission     Code Status: FULL Family Communication: no family present at time of exam Disposition Plan: CIR vs SNF    Consultants: Dr. Alexis Goodell (neurology) Dr. Sanda Klein (cardiology)  Dr Sherley Bounds  (Neurosurgery)   Procedure/Significant Events: 6/5 cardiac catheterization;Mild disease RCA, Moderate disease small caliber intermediate branch  - Normal LV systolic function 6/5 CT C-spine without contrast/head CT without contrast; No acute intracranial or cervical spine injury. Generalized brain atrophy.Congenital absence septum pellucidum 6/5 MRI brain without contrast; Stable moderate generalized atrophy.No acute intracranial abnormality.  -Multiple cystic lesions within scalp likely sebaceous cysts; May be infected. 6/6 MRI C-spine without contrast;Focal cord signal abnormality posterior to the C5-6 disc level  and C5 vertebral body representing cord edema secondary to acute cord injury. no hemorrhage.  -Leftward disc osteophyte complex at C5-6 contacts left ventral surface the cord at the level of the cord signal abnormality.  -Moderate left/mild right foraminal narrowing at C5-6.     Culture 6/5 on: MRSA by PCR negative  Antibiotics: NA  DVT prophylaxis: Heparin   Devices None  LINES / TUBES:  6/5 ga left hand    Continuous Infusions:   Objective: VITAL SIGNS: Temp: 98.8 F (37.1 C) (06/07 0300) Temp src: Oral (06/07 0300) BP: 136/71 mmHg (06/07 0600) Pulse Rate: 70 (06/07 0700) SPO2; FIO2:   Intake/Output Summary (Last 24 hours) at 12/28/13 0736 Last data filed at 12/28/13 0300  Gross per 24 hour  Intake    945 ml  Output   1675 ml  Net   -730 ml     Exam: General: A./O. x4, NAD, No acute respiratory distress Lungs: Clear to auscultation bilaterally without wheezes or crackles Cardiovascular: Regular rate and rhythm without murmur gallop or rub normal S1 and S2 Abdomen: Nontender, nondistended, soft, bowel sounds positive, no rebound, no ascites, no appreciable mass Extremities: No significant cyanosis, clubbing, or edema bilateral lower extremities Neurologic; CN II-XII intact, Tongue/Uvula midline, (+) Past pointing w/  Rt hand in the  Finger-Nose-Finger test, Lt Hemiparesis with contracture Lt Hand Lt Arm strength 3/5, Lt Leg 4/5  Data Reviewed: Basic Metabolic Panel:  Recent Labs Lab 12/26/13 0216 12/27/13 0312  NA 140  --   K 3.6*  --   CL 101  --   CO2 22  --   GLUCOSE 231*  --   BUN 10  --   CREATININE 1.04  --   CALCIUM 8.7  --   MG  --  1.6  PHOS  --  2.7   Liver Function Tests: No results found for this basename: AST, ALT, ALKPHOS, BILITOT, PROT, ALBUMIN,  in the last 168 hours No results found for this basename: LIPASE, AMYLASE,  in the last 168 hours No results found for this basename: AMMONIA,  in the last 168 hours CBC:  Recent Labs Lab 12/26/13 0216  WBC 7.3  NEUTROABS 3.7  HGB 13.1  HCT 38.6*  MCV 84.1  PLT 200   Cardiac Enzymes:  Recent Labs Lab 12/26/13 1200 12/26/13 1620 12/26/13 2250  TROPONINI <0.30 <0.30 <0.30   BNP (last 3 results) No results found for this basename: PROBNP,  in the last 8760 hours CBG:  Recent Labs Lab 12/26/13 2114 12/27/13 0811 12/27/13 1224 12/27/13 1656 12/27/13 2120  GLUCAP 147* 164* 230* 128* 158*    Recent Results (from the past 240 hour(s))  MRSA PCR SCREENING     Status: None   Collection Time    12/26/13  3:50 AM      Result Value Ref Range Status   MRSA by PCR NEGATIVE  NEGATIVE Final   Comment:            The GeneXpert MRSA Assay (FDA     approved for NASAL specimens     only), is one component of a     comprehensive MRSA colonization     surveillance program. It is not     intended to diagnose MRSA     infection nor to guide or     monitor treatment for     MRSA infections.     Studies:  Recent x-ray studies have been reviewed in detail by the Attending Physician  Scheduled Meds:  Scheduled Meds: . aspirin EC  81 mg Oral Daily  . heparin  5,000 Units Subcutaneous 3 times per day  . insulin aspart  0-15 Units Subcutaneous TID WC  . lisinopril  10 mg Oral Daily  . NIFEdipine  30 mg Oral Daily  . simvastatin  20  mg Oral q1800    Time spent on care of this patient: 40 mins   Allie Bossier , MD   [facility] Office  [phone] Pager [phone]  On-Call/Text Page:      [email or web address]      password [email or web address]  If 7PM-7AM, please contact night-coverage [email or web address] Password [email or web address] 12/28/2013, 7:36 AM   LOS: 2 days

## 2013-12-29 ENCOUNTER — Encounter: Payer: Self-pay | Admitting: Cardiovascular Disease

## 2013-12-29 DIAGNOSIS — I517 Cardiomegaly: Secondary | ICD-10-CM

## 2013-12-29 DIAGNOSIS — M4712 Other spondylosis with myelopathy, cervical region: Secondary | ICD-10-CM

## 2013-12-29 DIAGNOSIS — I201 Angina pectoris with documented spasm: Secondary | ICD-10-CM | POA: Insufficient documentation

## 2013-12-29 LAB — GLUCOSE, CAPILLARY
GLUCOSE-CAPILLARY: 175 mg/dL — AB (ref 70–99)
Glucose-Capillary: 180 mg/dL — ABNORMAL HIGH (ref 70–99)
Glucose-Capillary: 161 mg/dL — ABNORMAL HIGH (ref 70–99)
Glucose-Capillary: 160 mg/dL — ABNORMAL HIGH (ref 70–99)

## 2013-12-29 LAB — HEMOGLOBIN A1C
Hgb A1c MFr Bld: 7.4 % — ABNORMAL HIGH (ref <5.7)
Mean Plasma Glucose: 166 mg/dL — ABNORMAL HIGH (ref <117)

## 2013-12-29 NOTE — Progress Notes (Signed)
Physical Therapy Treatment Patient Details Name: [name] MRN: [id number] DOB: 02-09-1961 Today's Date: 12/29/2013    History of Present Illness Pt. is a 53 year old homeless male admitted 12/26/13 with STEMI, AMS, acute intoxication, potential cocaine abuse, contusion to forehead.  He was found down in woods, cord edema at C5-6 thought due to his fall with resultant weakness.  NS says no immediate intervention from surgical standpoint.  Neurology requesting PT consult.      PT Comments    Pt admitted with above. Pt currently with functional limitations due to balance and endurance deficits. Pt will benefit from skilled PT to increase their independence and safety with mobility to allow discharge to the venue listed below.   Follow Up Recommendations  Supervision for mobility/OOB;SNF     Equipment Recommendations  Other (comment) (TBA)    Recommendations for Other Services       Precautions / Restrictions Precautions Precautions: Fall;Cervical;Other (comment) (C5-6 cord edema) Precaution Comments: Instructed pt. in log rolling technique Restrictions Weight Bearing Restrictions: No    Mobility  Bed Mobility Overal bed mobility: Needs Assistance;+2 for physical assistance Bed Mobility: Rolling;Sidelying to Sit Rolling: Mod assist Sidelying to sit: Mod assist;+2 for physical assistance       General bed mobility comments: needed assist at shoulders and LEs  to transition to sitting position at EOB.  Took a few min for pt to obtain balance but he then could sit at EOB.   Transfers Overall transfer level: Needs assistance Equipment used: Ambulation equipment used [name] Plus) Transfers: Sit to/from [facility] Sit to Stand: Mod assist;+2 physical assistance;From elevated surface Stand pivot transfers: Min guard;+2 physical assistance;+2 safety/equipment;From elevated surface       General transfer comment: Used [name] for pt to perform sit to stand.   Pt needed initial push for power up but then he came up.  Cued to prevent hyperextension but pt needed continued cuing for this.  Pt able to control descent into chair when finished.  Left footplate on and moved [name] Plus to chair for the stand pivot transfer.   Ambulation/Gait                 Stairs            Wheelchair Mobility    Modified Rankin (Stroke Patients Only)       Balance Overall balance assessment: Needs assistance;History of Falls Sitting-balance support: Bilateral upper extremity supported;Feet supported Sitting balance-Leahy Scale: Fair     Standing balance support: Bilateral upper extremity supported;During functional activity Standing balance-Leahy Scale: Poor Standing balance comment: RElies on [name] for standing balance but was able with this equipment to stand a total of 10 min.  Pt able to weight shift but could not pick up the LEs.                      Cognition Arousal/Alertness: Awake/alert Behavior During Therapy: WFL for tasks assessed/performed Overall Cognitive Status: Within Functional Limits for tasks assessed                      Exercises General Exercises - Lower Extremity Ankle Circles/Pumps: AROM;Both;10 reps;Seated Long Arc Quad: AROM;Both;10 reps;Seated Hip Flexion/Marching: AROM;Both;10 reps;Seated    General Comments        Pertinent Vitals/Pain VSS, no pain per pt    Home Living  Prior Function            PT Goals (current goals can now be found in the care plan section) Progress towards PT goals: Progressing toward goals    Frequency  Min 4X/week    PT Plan Current plan remains appropriate    Co-evaluation             End of Session Equipment Utilized During Treatment: Gait belt Activity Tolerance: Patient limited by fatigue Patient left: in chair;with call bell/phone within reach     Time: 1345-1424 PT Time Calculation (min): 39  min  Charges:  $Therapeutic Exercise: 8-22 mins $Therapeutic Activity: 23-37 mins                    G Codes:      Cliff Damiani Dorene Ar 01/08/2014, 3:07 PM [facility] Acute Rehabilitation [phone] (pager)

## 2013-12-29 NOTE — Consult Note (Signed)
Physical Medicine and Rehabilitation Consult Reason for Consult: Central cord syndrome Referring Physician: [facility]   HPI: [name] is a 53 y.o. right hand male with history of diabetes mellitus with peripheral neuropathy, hypertension, tobacco abuse and polysubstance abuse/alcohol abuse. Presented 12/26/2013 with complaints of whole-body numbness and weakness after a fall and struck his face. By report patient is homeless and lives in the woods in a tent. MRI of the brain with no acute intracranial abnormalities. Alcohol level of 274 and drug screen positive for marijuana on admission. MRI of the spine with signal change at C5-6 as well as spondylosis but no ongoing cord compression and moderate stenosis. Neurosurgery consulted Dr. Sherley Bounds no surgical intervention required suspect central cord syndrome. Echocardiogram is pending. Subcutaneous heparin for DVT prophylaxis. Tolerating a regular diet. Physical therapy evaluation completed 12/28/2013 with recommendations for physical medicine rehabilitation consult.   Review of Systems  Neurological: Positive for weakness.       Numbness  All other systems reviewed and are negative.  Past Medical History  Diagnosis Date  . Diabetes mellitus   . Hypertension    History reviewed. No pertinent past surgical history. Family History  Problem Relation Age of Onset  . Hypertension Mother   . Hypertension Father    Social History:  reports that he has been smoking Cigarettes.  He has been smoking about 0.00 packs per day. He does not have any smokeless tobacco history on file. He reports that he drinks alcohol. He reports that he uses illicit drugs ("Crack" cocaine). Allergies: No Known Allergies Medications Prior to Admission  Medication Sig Dispense Refill  . lisinopril-hydrochlorothiazide (PRINZIDE,ZESTORETIC) 20-12.5 MG per tablet Take 1 tablet by mouth daily.      Marland Kitchen PREGABALIN PO Take 1 tablet by mouth 3 (three) times daily  as needed (diabetic nerve pain).      . metFORMIN (GLUCOPHAGE) 1000 MG tablet Take 1,000 mg by mouth 2 (two) times daily with a meal.        Home: Home Living Family/patient expects to be discharged to:: Unsure (pt. says he as to wait 6 months to go back to shelter) Additional Comments: Pt. says he has family but they will not allow him to stay with them post hospitalization  Functional History: Prior Function Level of Independence: Independent Comments: Pt. reports he has an active drivers liscense but no car.  Says he is independent as a homeless man Functional Status:  Mobility: Bed Mobility Overal bed mobility: Needs Assistance;+2 for physical assistance Bed Mobility: Rolling;Sidelying to Sit;Sit to Supine Rolling: Mod assist Sidelying to sit: +2 for physical assistance;Mod assist Sit to sidelying: +2 for physical assistance;Mod assist General bed mobility comments: needed assist at shoulders adn LEs  to transition to sitting position at EOB and to return to sidelying  Transfers Overall transfer level:  (not attempted) Ambulation/Gait Ambulation/Gait assistance:  (not attempted)    ADL:    Cognition: Cognition Overall Cognitive Status: Within Functional Limits for tasks assessed Orientation Level: Oriented X4 Cognition Arousal/Alertness: Awake/alert Behavior During Therapy: WFL for tasks assessed/performed Overall Cognitive Status: Within Functional Limits for tasks assessed  Blood pressure 129/70, pulse 73, temperature 98.9 F (37.2 C), temperature source Oral, resp. rate 17, height 6\' 2"  (1.88 m), weight 123.106 kg (271 lb 6.4 oz), SpO2 98.00%. Physical Exam  Constitutional: He is oriented to person, place, and time.  HENT:  Head: Normocephalic.  Eyes: EOM are normal.  Neck: Normal range of motion. Neck supple.  No thyromegaly present.  Cardiovascular: Normal rate and regular rhythm.   Respiratory: Effort normal and breath sounds normal. No respiratory distress.    GI: Soft. Bowel sounds are normal. He exhibits no distension.  Neurological: He is alert and oriented to person, place, and time.  RUE 3 to 3+/5 and inconsistent. LUE 3- to 3/5 prox bicep, deltoid, 2- tricep, trace to absent left wrist/HI. LE's grossly 3+ to 4/5   Skin: Skin is warm and dry.    Results for orders placed during the hospital encounter of 12/26/13 (from the past 24 hour(s))  HEMOGLOBIN A1C     Status: Abnormal   Collection Time    12/28/13  1:40 PM      Result Value Ref Range   Hemoglobin A1C 7.4 (*) <5.7 %   Mean Plasma Glucose 166 (*) <117 mg/dL  LIPID PANEL     Status: Abnormal   Collection Time    12/28/13  1:40 PM      Result Value Ref Range   Cholesterol 204 (*) 0 - 200 mg/dL   Triglycerides 202 (*) <150 mg/dL   HDL 56  >39 mg/dL   Total CHOL/HDL Ratio 3.6     VLDL 40  0 - 40 mg/dL   LDL Cholesterol 108 (*) 0 - 99 mg/dL  GLUCOSE, CAPILLARY     Status: Abnormal   Collection Time    12/28/13  4:40 PM      Result Value Ref Range   Glucose-Capillary 166 (*) 70 - 99 mg/dL  GLUCOSE, CAPILLARY     Status: Abnormal   Collection Time    12/28/13  9:16 PM      Result Value Ref Range   Glucose-Capillary 167 (*) 70 - 99 mg/dL   Comment 1 Documented in Chart     Comment 2 Notify RN    GLUCOSE, CAPILLARY     Status: Abnormal   Collection Time    12/29/13  6:15 AM      Result Value Ref Range   Glucose-Capillary 175 (*) 70 - 99 mg/dL   Comment 1 Documented in Chart     Comment 2 Notify RN    GLUCOSE, CAPILLARY     Status: Abnormal   Collection Time    12/29/13 11:16 AM      Result Value Ref Range   Glucose-Capillary 180 (*) 70 - 99 mg/dL   No results found.  Assessment/Plan: Diagnosis: cervical central cord 1. Does the need for close, 24 hr/day medical supervision in concert with the patient's rehab needs make it unreasonable for this patient to be served in a less intensive setting? Potentially 2. Co-Morbidities requiring supervision/potential  complications: neurogenic bowel/bladder 3. Due to bladder management, bowel management, safety, skin/wound care, disease management, medication administration, pain management and patient education, does the patient require 24 hr/day rehab nursing? Potentially 4. Does the patient require coordinated care of a physician, rehab nurse, PT, OT to address physical and functional deficits in the context of the above medical diagnosis(es)? Potentially Addressing deficits in the following areas: balance, endurance, locomotion, strength, transferring, bowel/bladder control, bathing, dressing, feeding, grooming and toileting 5. Can the patient actively participate in an intensive therapy program of at least 3 hrs of therapy per day at least 5 days per week? Potentially 6. The potential for patient to make measurable gains while on inpatient rehab is good and fair 7. Anticipated functional outcomes upon discharge from inpatient rehab are min assist and mod assist  with PT, min assist  and mod assist with OT, n/a with SLP. 8. Estimated rehab length of stay to reach the above functional goals is: 2 weeks 9. Does the patient have adequate social supports to accommodate these discharge functional goals? No 10. Anticipated D/C setting: Other 11. Anticipated post D/C treatments: [place] therapy 12. Overall Rehab/Functional Prognosis: good and fair  RECOMMENDATIONS: This patient's condition is appropriate for continued rehabilitative care in the following setting: SNF Patient has agreed to participate in recommended program. Potentially Note that insurance prior authorization may be required for reimbursement for recommended care.  Comment: Pt will required extended rehab and assistance after this injury. He has no reliable social supports nor a reliable social situation.   Meredith Staggers, MD, [facility]     12/29/2013

## 2013-12-29 NOTE — Progress Notes (Signed)
Rehab Admissions Coordinator Note:  Patient was screened by Quentin Mulling Kerly Rigsbee for appropriateness for an Inpatient Acute Rehab Consult.  At this time, we are recommending Inpatient Rehab consult.  Gracin Mcpartland L Toretto Tingler, PT 12/29/2013, 8:48 AM  I can be reached at [phone].

## 2013-12-29 NOTE — Progress Notes (Signed)
Echocardiogram 2D Echocardiogram has been performed.  [name] 12/29/2013, 11:26 AM

## 2013-12-29 NOTE — Progress Notes (Signed)
Progress Note  [name]:[id number] DOB: [birth year] DOA: 12/26/2013 PCP: Pcp Not In System  Admit HPI / Brief Narrative: 53 y.o.BM PMHx cocaine abuse, HTN, HLD, diabetes type 2,tobacco abuse, who presented to [facility] on 12/26/2013 with complaints of whole body numbness and weakness. History is limited as patient appears to be drunk. He reports that him and his "brothers" were drinking and likely using cocaine this evening. Was found down but not unconscious in the grass by paramedics. Unclear if tripped or had syncope. Hematoma to forehead and minor scrapping on his knees. UDS positive cocaine, EtOH= 274 on admission   HPI/Subjective: Wanting to get up the bedside chair  Assessment/Plan: STEMI  -negative for obstruction by cath (possibly vasospasm from drug toxicity?)   Acute intoxication;   positive for cocaine, EtOH= 274 on admission, continue CIWA protocol l - echocardiogram pending  Left hemiparesis/spinal cord contusion/ -Counseled patient that this may be permanent however we would not know until he had completed physical therapy, and possible surgery -Patient is to followup with Dr. Sherley Bounds (neurosurgery) within 2 weeks after discharge for evaluation for future surgery  Diabetes Type 2 -Obtain hemoglobin A1c -Obtain lipid panel  -Continue moderate SSI for control -Continue heart healthy/carb modified diet  Hyperlipidemia  -See diabetes type 2  Homelessness  -Social work consult  Polysubstance abuse  - Social work consult inpatient/outpatient rehabilitation  -Patient UDS positive for cocaine, EtOH= 274 on admission     Code Status: FULL Family Communication: no family present at time of exam Disposition Plan: CIR ???    Consultants: Dr. Alexis Goodell (neurology) Dr. Sanda Klein (cardiology)  Dr Sherley Bounds (Neurosurgery)   Procedure/Significant Events: 6/5 cardiac catheterization;Mild disease RCA, Moderate disease small caliber  intermediate branch  - Normal LV systolic function 6/5 CT C-spine without contrast/head CT without contrast; No acute intracranial or cervical spine injury. Generalized brain atrophy.Congenital absence septum pellucidum 6/5 MRI brain without contrast; Stable moderate generalized atrophy.No acute intracranial abnormality.  -Multiple cystic lesions within scalp likely sebaceous cysts; May be infected. 6/6 MRI C-spine without contrast;Focal cord signal abnormality posterior to the C5-6 disc level  and C5 vertebral body representing cord edema secondary to acute cord injury. no hemorrhage.  -Leftward disc osteophyte complex at C5-6 contacts left ventral surface the cord at the level of the cord signal abnormality.  -Moderate left/mild right foraminal narrowing at C5-6.     Culture 6/5 on: MRSA by PCR negative  Antibiotics: NA  DVT prophylaxis: Heparin   Devices None  LINES / TUBES:  6/5 ga left hand    Objective: VITAL SIGNS: Temp: 98.9 F (37.2 C) (06/08 0437) Temp src: Oral (06/08 0437) BP: 129/70 mmHg (06/08 1027) Pulse Rate: 73 (06/08 0437)    Intake/Output Summary (Last 24 hours) at 12/29/13 1156 Last data filed at 12/29/13 0835  Gross per 24 hour  Intake    780 ml  Output   2050 ml  Net  -1270 ml     Exam: General: A./O. x4, NAD, No acute respiratory distress Lungs: Clear to auscultation bilaterally without wheezes or crackles Cardiovascular: Regular rate and rhythm without murmur gallop or rub normal S1 and S2 Abdomen: Nontender, nondistended, soft, bowel sounds positive, no rebound, no ascites, no appreciable mass Extremities: No significant cyanosis, clubbing, or edema bilateral lower extremities Neurologic; CN II-XII intact, Tongue/Uvula midline,  Lt Hemiparesis with contracture Lt Hand Lt Arm strength 3/5, Lt Leg 4/5  Data Reviewed: Basic Metabolic Panel:  Recent Labs Lab  12/26/13 0216 12/27/13 0312  NA 140  --   K 3.6*  --   CL 101  --   CO2  22  --   GLUCOSE 231*  --   BUN 10  --   CREATININE 1.04  --   CALCIUM 8.7  --   MG  --  1.6  PHOS  --  2.7   Liver Function Tests: No results found for this basename: AST, ALT, ALKPHOS, BILITOT, PROT, ALBUMIN,  in the last 168 hours No results found for this basename: LIPASE, AMYLASE,  in the last 168 hours No results found for this basename: AMMONIA,  in the last 168 hours CBC:  Recent Labs Lab 12/26/13 0216  WBC 7.3  NEUTROABS 3.7  HGB 13.1  HCT 38.6*  MCV 84.1  PLT 200   Cardiac Enzymes:  Recent Labs Lab 12/26/13 1200 12/26/13 1620 12/26/13 2250  TROPONINI <0.30 <0.30 <0.30   BNP (last 3 results) No results found for this basename: PROBNP,  in the last 8760 hours CBG:  Recent Labs Lab 12/28/13 1211 12/28/13 1640 12/28/13 2116 12/29/13 0615 12/29/13 1116  GLUCAP 188* 166* 167* 175* 180*    Recent Results (from the past 240 hour(s))  MRSA PCR SCREENING     Status: None   Collection Time    12/26/13  3:50 AM      Result Value Ref Range Status   MRSA by PCR NEGATIVE  NEGATIVE Final   Comment:            The GeneXpert MRSA Assay (FDA     approved for NASAL specimens     only), is one component of a     comprehensive MRSA colonization     surveillance program. It is not     intended to diagnose MRSA     infection nor to guide or     monitor treatment for     MRSA infections.       Scheduled Meds:  Scheduled Meds: . aspirin EC  81 mg Oral Daily  . heparin  5,000 Units Subcutaneous 3 times per day  . insulin aspart  0-15 Units Subcutaneous TID WC  . lisinopril  10 mg Oral Daily  . NIFEdipine  30 mg Oral Daily  . simvastatin  20 mg Oral q1800    Time spent on care of this patient: 25 mins   [name] , DO   [facility]  Pager [phone]  On-Call/Text Page:      [email or web address]      password [email or web address]  If 7PM-7AM, please contact night-coverage [email or web address] Password [email or web address] 12/29/2013, 11:56 AM   LOS: 3 days

## 2013-12-29 NOTE — Progress Notes (Signed)
Clinical Social Work Department CLINICAL SOCIAL WORK PLACEMENT NOTE 12/29/2013  Patient:  [name], [name]  Account Number:  [id number] Admit date:  12/26/2013  Clinical Social Worker:  Megan Salon  Date/time:  12/29/2013 12:54 PM  Clinical Social Work is seeking post-discharge placement for this patient at the following level of care:   [place]   (*CSW will update this form in Epic as items are completed)   12/29/2013  Patient/family provided with [place] Department of Clinical Social Work's list of facilities offering this level of care within the geographic area requested by the patient (or if unable, by the patient's family).  12/29/2013  Patient/family informed of their freedom to choose among providers that offer the needed level of care, that participate in Medicare, Medicaid or managed care program needed by the patient, have an available bed and are willing to accept the patient.  12/29/2013  Patient/family informed of [facility]' ownership interest in [facility], as well as of the fact that they are under no obligation to receive care at this facility.  PASARR submitted to EDS on 12/29/2013 PASARR number received on 12/29/2013  FL2 transmitted to all facilities in geographic area requested by pt/family on  12/29/2013 FL2 transmitted to all facilities within larger geographic area on   Patient informed that his/her managed care company has contracts with or will negotiate with  certain facilities, including the following:     Patient/family informed of bed offers received:   Patient chooses bed at  Physician recommends and patient chooses bed at    Patient to be transferred to  on   Patient to be transferred to facility by  Patient and family notified of transfer on  Name of family member notified:    The following physician request were entered in Epic:   Additional Comments:  Jeanette Caprice, MSW, [place]

## 2013-12-29 NOTE — Progress Notes (Signed)
Clinical Social Work Department BRIEF PSYCHOSOCIAL ASSESSMENT 12/29/2013  Patient:  [name], [name]     Account Number:  [id number]     Admit date:  12/26/2013  Clinical Social Worker:  Megan Salon  Date/Time:  12/29/2013 12:49 PM  Referred by:  Physician  Date Referred:  12/29/2013 Referred for  SNF Placement   Other Referral:   Interview type:  Patient Other interview type:    PSYCHOSOCIAL DATA Living Status:  ALONE Admitted from facility:   Level of care:   Primary support name:  Benita Stabile Primary support relationship to patient:  SIBLING Degree of support available:    CURRENT CONCERNS Current Concerns  Post-Acute Placement   Other Concerns:    SOCIAL WORK ASSESSMENT / PLAN Clinical Social Worker received referral for SNF placement at d/c. CSW introduced self and explained reason for visit. CSW explained SNF process to patient. Patient reported he is agreeable for SNF placement as a backup to CIR. CSW explained that due to patient not having insurance, there may be limited SNF options in the local [place] and Education officer, museum may have to search outside of the county for a SNF bed. Patient verbalized his understanding and stated that was fine.   CSW will complete FL2 for MD's signature and will update patient when bed offers are received.   Assessment/plan status:  Psychosocial Support/Ongoing Assessment of Needs Other assessment/ plan:   Information/referral to community resources:   SNF information    PATIENT'S/FAMILY'S RESPONSE TO PLAN OF CARE: Patient states he is agreeable to going to SNF in the event that CIR is not an option. Patient states he would like the rehab.        Jeanette Caprice, MSW, [place]

## 2013-12-29 NOTE — Progress Notes (Signed)
Subjective: Patient remains weak and reports numbness on the left side of his body.  Has started to work with therapy but has not yet attempted standing.    Objective: Current vital signs: BP 143/79  Pulse 73  Temp(Src) 98.9 F (37.2 C) (Oral)  Resp 17  Ht 6\' 2"  (1.88 m)  Wt 123.106 kg (271 lb 6.4 oz)  BMI 34.83 kg/m2  SpO2 98% Vital signs in last 24 hours: Temp:  [98.7 F (37.1 C)-99.9 F (37.7 C)] 98.9 F (37.2 C) (06/08 0437) Pulse Rate:  [73-89] 73 (06/08 0437) Resp:  [14-19] 17 (06/08 0437) BP: (112-152)/(51-79) 143/79 mmHg (06/08 0437) SpO2:  [96 %-100 %] 98 % (06/08 0437)  Intake/Output from previous day: 06/07 0701 - 06/08 0700 In: 29 [P.O.:890] Out: 2600 [Urine:2600] Intake/Output this shift:   Nutritional status:    Neurologic Exam: Mental Status:  Alert, oriented, thought content appropriate. Speech fluent without evidence of aphasia. Able to follow 3 step commands without difficulty.  Cranial Nerves:  II: visual fields grossly normal, pupils equal, round, reactive to light and accommodation  III,IV, VI: ptosis not present, extraocular muscles extra-ocular motions intact bilaterally  V,VII: smile symmetric, facial light touch sensation normal bilaterally  VIII: hearing normal bilaterally  IX,X: gag reflex present  XI: trapezius strength/neck flexion strength normal bilaterally  XII: tongue strength normal  Motor:  Lifts both arms above head. Hand grip 3-4/5 bilaterally. Lifts right leg off the bed, unable to lift the left off the bed.    Sensory: Pinprick and light decreased on the left Deep Tendon Reflexes:  2+ throughout  Plantars:  Right: downgoing  Left: upgoing    Lab Results: Basic Metabolic Panel:  Recent Labs Lab 12/26/13 0216 12/27/13 0312  NA 140  --   K 3.6*  --   CL 101  --   CO2 22  --   GLUCOSE 231*  --   BUN 10  --   CREATININE 1.04  --   CALCIUM 8.7  --   MG  --  1.6  PHOS  --  2.7    Liver Function Tests: No results  found for this basename: AST, ALT, ALKPHOS, BILITOT, PROT, ALBUMIN,  in the last 168 hours No results found for this basename: LIPASE, AMYLASE,  in the last 168 hours No results found for this basename: AMMONIA,  in the last 168 hours  CBC:  Recent Labs Lab 12/26/13 0216  WBC 7.3  NEUTROABS 3.7  HGB 13.1  HCT 38.6*  MCV 84.1  PLT 200    Cardiac Enzymes:  Recent Labs Lab 12/26/13 1200 12/26/13 1620 12/26/13 2250  TROPONINI <0.30 <0.30 <0.30    Lipid Panel:  Recent Labs Lab 12/28/13 1340  CHOL 204*  TRIG 202*  HDL 56  CHOLHDL 3.6  VLDL 40  LDLCALC 108*    CBG:  Recent Labs Lab 12/28/13 0832 12/28/13 1211 12/28/13 1640 12/28/13 2116 12/29/13 0615  GLUCAP 162* 188* 166* 167* 175*    Microbiology: Results for orders placed during the hospital encounter of 12/26/13  MRSA PCR SCREENING     Status: None   Collection Time    12/26/13  3:50 AM      Result Value Ref Range Status   MRSA by PCR NEGATIVE  NEGATIVE Final   Comment:            The GeneXpert MRSA Assay (FDA     approved for NASAL specimens     only), is one component of  a     comprehensive MRSA colonization     surveillance program. It is not     intended to diagnose MRSA     infection nor to guide or     monitor treatment for     MRSA infections.    Coagulation Studies: No results found for this basename: LABPROT, INR,  in the last 72 hours  Imaging: Mr Brain Wo Contrast  12/27/2013   CLINICAL DATA:  Weakness in the upper and lower extremities. Status post fall.  EXAM: MRI HEAD WITHOUT CONTRAST  TECHNIQUE: Multiplanar, multiecho pulse sequences of the brain and surrounding structures were obtained without intravenous contrast.  COMPARISON:  CT head and cervical spine 12/26/2013. MRI brain 04/27/2012.  FINDINGS: No acute infarct, hemorrhage, or mass lesion is present. Absence of the septum pellucidum is again noted. To moderate generalized atrophy is evident. Multiple cystic lesions are noted  within the scalp. There is some restricted diffusion. These likely represent sebaceous cyst, possible infected.  The ventricles are proportionate to the degree of atrophy. No significant extra-axial fluid collection is present.  Flow is present in the major intracranial arteries. The globes and orbits are intact. Mild mucosal thickening is present in the maxillary sinuses bilaterally.  IMPRESSION: 1. Stable appearance of moderate generalized atrophy. 2. No acute intracranial abnormality. 3. Congenital absence of the septum pellucidum. 4. Multiple cystic lesions within the scalp likely represent sebaceous cysts. The may be infected.   Electronically Signed   By: Lawrence Santiago M.D.   On: 12/27/2013 11:40   Mr Cervical Spine Wo Contrast  12/27/2013   CLINICAL DATA:  Fall.  Weakness of the arms and legs.  EXAM: MRI CERVICAL SPINE WITHOUT CONTRAST  TECHNIQUE: Multiplanar, multisequence MR imaging of the cervical spine was performed. No intravenous contrast was administered.  COMPARISON:  CT of the cervical spine 12/26/2013.  FINDINGS: Focal T2 cord signal abnormality is present posterior to C5 and the C5-6 disc level. This is more prominent left than right. There is no significant expansion of the scratch the there slight expansion of the cord at this level.  Cord signal is otherwise normal to the lowest imaged level, T3-4.  Chronic endplate marrow changes are present at C6-7 and C7-T1. Vertebral body heights and alignment are maintained. The craniocervical junction is within normal limits. Flow is present in the vertebral arteries bilaterally.  C2-3: Mild left-sided uncovertebral and facet spurring is present without significant stenosis.  C3-4: Mild uncovertebral spurring is present bilaterally. There is some facet hypertrophy as well. This results in mild foraminal and central canal narrowing, right greater than left.  C4-5: A leftward disc osteophyte complex is present. There is mild central and left foraminal  narrowing.  C5-6: A leftward disc osteophyte complex is present. This effaces in contacts the left ventral surface the cord. Mild to moderate left and mild right foraminal narrowing is due to uncovertebral spurring.  C6-7: A broad-based disc osteophyte complex is present. There is effacement of the ventral CSF. Mild foraminal narrowing is worse on the left.  C7-T1: Uncovertebral spurring leads to moderate foraminal stenosis bilaterally, worse on the left.  T1-2: Negative.  IMPRESSION: 1. Focal cord signal abnormality posterior to the C5-6 disc level and C5 vertebral body representing cord edema likely secondary to a acute cord injury. There is no associated hemorrhage. 2. Leftward disc osteophyte complex at C5-6 contacts the left ventral surface the cord at the level of the cord signal abnormality. 3. Moderate left and mild right foraminal  narrowing at C5-6. 4. No acute osseous abnormality. 5. Mild foraminal and central canal stenosis at C3-4 is worse on the right. 6. Mild central and left foraminal narrowing at C4-5. 7. Mild central and foraminal narrowing bilaterally at C6-7 is worse on the left. 8. Moderate foraminal stenosis bilaterally at C7-T1 due to uncovertebral spurring, worse on the left.   Electronically Signed   By: Lawrence Santiago M.D.   On: 12/27/2013 11:56    Medications:  I have reviewed the patient's current medications. Scheduled: . aspirin EC  81 mg Oral Daily  . heparin  5,000 Units Subcutaneous 3 times per day  . insulin aspart  0-15 Units Subcutaneous TID WC  . lisinopril  10 mg Oral Daily  . NIFEdipine  30 mg Oral Daily  . simvastatin  20 mg Oral q1800    Assessment/Plan: Patient working with therapy.  Disposition being determined.   Recommendations: 1.  No further neurologic intervention is recommended at this time.  If further questions arise, please call or page at that time.  Thank you for allowing neurology to participate in the care of this patient.    LOS: 3 days    Alexis Goodell, MD [facility] [phone] 12/29/2013  9:25 AM

## 2013-12-30 LAB — BASIC METABOLIC PANEL
BUN: 11 mg/dL (ref 6–23)
CHLORIDE: 98 meq/L (ref 96–112)
CO2: 24 meq/L (ref 19–32)
CREATININE: 1 mg/dL (ref 0.50–1.35)
Calcium: 9.2 mg/dL (ref 8.4–10.5)
GFR calc Af Amer: >90 mL/min (ref >90)
GFR calc non Af Amer: 83 mL/min — ABNORMAL LOW (ref >90)
Glucose, Bld: 159 mg/dL — ABNORMAL HIGH (ref 70–99)
Potassium: 4.2 mEq/L (ref 3.7–5.3)
SODIUM: 136 meq/L — AB (ref 137–147)

## 2013-12-30 LAB — CBC
HCT: 40 % (ref 39.0–52.0)
Hemoglobin: 13.2 g/dL (ref 13.0–17.0)
MCH: 27.7 pg (ref 26.0–34.0)
MCHC: 33 g/dL (ref 30.0–36.0)
MCV: 83.9 fL (ref 78.0–100.0)
Platelets: 210 10*3/uL (ref 150–400)
RBC: 4.77 MIL/uL (ref 4.22–5.81)
RDW: 14.7 % (ref 11.5–15.5)
WBC: 6.7 10*3/uL (ref 4.0–10.5)

## 2013-12-30 LAB — GLUCOSE, CAPILLARY
GLUCOSE-CAPILLARY: 162 mg/dL — AB (ref 70–99)
GLUCOSE-CAPILLARY: 173 mg/dL — AB (ref 70–99)
Glucose-Capillary: 211 mg/dL — ABNORMAL HIGH (ref 70–99)
Glucose-Capillary: 155 mg/dL — ABNORMAL HIGH (ref 70–99)

## 2013-12-30 MED ORDER — METFORMIN HCL 500 MG PO TABS: 1000.0000 mg | ORAL_TABLET | Freq: Two times a day (BID) | ORAL | Status: DC

## 2013-12-30 MED ORDER — METFORMIN HCL 500 MG PO TABS
1000.0000 mg | ORAL_TABLET | Freq: Two times a day (BID) | ORAL | Status: DC
  Administered 2013-12-30 – 2014-01-02 (×6): 1000 mg via ORAL
  Filled 2013-12-30 (×8): qty 2

## 2013-12-30 NOTE — Progress Notes (Signed)
Progress Note  [name] ZDG:[id number] DOB: [birth year] DOA: 12/26/2013 PCP: Pcp Not In System  Admit HPI / Brief Narrative: 53 y.o.BM PMHx cocaine abuse, HTN, HLD, diabetes type 2,tobacco abuse, who presented to [facility] on 12/26/2013 with complaints of whole body numbness and weakness. History is limited as patient appears to be drunk. He reports that him and his "brothers" were drinking and likely using cocaine this evening. Was found down but not unconscious in the grass by paramedics. Unclear if tripped or had syncope. Hematoma to forehead and minor scrapping on his knees. UDS positive cocaine, EtOH= 274 on admission   HPI/Subjective: Still with weakness   Assessment/Plan: STEMI  Cath negative. Likely cocaine related  Left hemiparesis/spinal cord contusion/ -Patient is to followup with Dr. Sherley Bounds (neurosurgery) within 2 weeks after discharge for evaluation for future surgery. Awaiting SNF. D/c telemetry  Diabetes Type 2 Resume metformin. Cont SSI  Hyperlipidemia  On statin  Polysubstance abuse  - Social work consult inpatient/outpatient rehabilitation  -Patient UDS positive for cocaine, EtOH= 274 on admission  Code Status: FULL Family Communication: no family present at time of exam Disposition Plan: SNF    Consultants: Dr. Alexis Goodell (neurology) Dr. Sanda Klein (cardiology)  Dr Sherley Bounds (Neurosurgery)   Procedure/Significant Events: 6/5 cardiac catheterization;Mild disease RCA, Moderate disease small caliber intermediate branch  - Normal LV systolic function 6/5 CT C-spine without contrast/head CT without contrast; No acute intracranial or cervical spine injury. Generalized brain atrophy.Congenital absence septum pellucidum 6/5 MRI brain without contrast; Stable moderate generalized atrophy.No acute intracranial abnormality.  -Multiple cystic lesions within scalp likely sebaceous cysts; May be infected. 6/6 MRI C-spine without  contrast;Focal cord signal abnormality posterior to the C5-6 disc level  and C5 vertebral body representing cord edema secondary to acute cord injury. no hemorrhage.  -Leftward disc osteophyte complex at C5-6 contacts left ventral surface the cord at the level of the cord signal abnormality.  -Moderate left/mild right foraminal narrowing at C5-6.     Culture 6/5 on: MRSA by PCR negative  Antibiotics: NA  DVT prophylaxis: Heparin   Devices None  LINES / TUBES:  6/5 ga left hand    Objective: VITAL SIGNS: Temp: 98.7 F (37.1 C) (06/09 1330) Temp src: Oral (06/09 1330) BP: 95/65 mmHg (06/09 1330) Pulse Rate: 86 (06/09 1330)    Intake/Output Summary (Last 24 hours) at 12/30/13 1723 Last data filed at 12/30/13 1300  Gross per 24 hour  Intake    480 ml  Output    450 ml  Net     30 ml     Exam: General: A./O. x4, NAD, in chair Lungs: Clear to auscultation bilaterally without wheezes or crackles Cardiovascular: Regular rate and rhythm without murmur gallop or rub normal S1 and S2 Abdomen: Nontender, nondistended, soft, bowel sounds positive, no rebound, no ascites, no appreciable mass Extremities: No significant cyanosis, clubbing, or edema bilateral lower extremities Neurologic; CN II-XII intact,  Lt Hemiparesis with contracture Lt Hand Lt Arm strength 3/5, Lt Leg 4/5  Data Reviewed: Basic Metabolic Panel:  Recent Labs Lab 12/26/13 0216 12/27/13 0312 12/30/13 0336  NA 140  --  136*  K 3.6*  --  4.2  CL 101  --  98  CO2 22  --  24  GLUCOSE 231*  --  159*  BUN 10  --  11  CREATININE 1.04  --  1.00  CALCIUM 8.7  --  9.2  MG  --  1.6  --  PHOS  --  2.7  --    Liver Function Tests: No results found for this basename: AST, ALT, ALKPHOS, BILITOT, PROT, ALBUMIN,  in the last 168 hours No results found for this basename: LIPASE, AMYLASE,  in the last 168 hours No results found for this basename: AMMONIA,  in the last 168 hours CBC:  Recent Labs Lab  12/26/13 0216 12/30/13 0336  WBC 7.3 6.7  NEUTROABS 3.7  --   HGB 13.1 13.2  HCT 38.6* 40.0  MCV 84.1 83.9  PLT 200 210   Cardiac Enzymes:  Recent Labs Lab 12/26/13 1200 12/26/13 1620 12/26/13 2250  TROPONINI <0.30 <0.30 <0.30   BNP (last 3 results) No results found for this basename: PROBNP,  in the last 8760 hours CBG:  Recent Labs Lab 12/29/13 1605 12/29/13 2121 12/30/13 0616 12/30/13 1139 12/30/13 1641  GLUCAP 161* 160* 162* 173* 211*    Recent Results (from the past 240 hour(s))  MRSA PCR SCREENING     Status: None   Collection Time    12/26/13  3:50 AM      Result Value Ref Range Status   MRSA by PCR NEGATIVE  NEGATIVE Final   Comment:            The GeneXpert MRSA Assay (FDA     approved for NASAL specimens     only), is one component of a     comprehensive MRSA colonization     surveillance program. It is not     intended to diagnose MRSA     infection nor to guide or     monitor treatment for     MRSA infections.       Scheduled Meds:  Scheduled Meds: . aspirin EC  81 mg Oral Daily  . heparin  5,000 Units Subcutaneous 3 times per day  . insulin aspart  0-15 Units Subcutaneous TID WC  . lisinopril  10 mg Oral Daily  . NIFEdipine  30 mg Oral Daily  . simvastatin  20 mg Oral q1800    Time spent on care of this patient: 25 mins   Delfina Redwood , DO   [facility]  Pager [phone]  On-Call/Text Page:      [email or web address]      password [email or web address]  If 7PM-7AM, please contact night-coverage [email or web address] Password [email or web address] 12/30/2013, 5:23 PM   LOS: 4 days

## 2013-12-30 NOTE — Progress Notes (Addendum)
Inpatient Rehabilitation  Dr. Naaman Plummer has completed his PMR consult and is recommending SNF level care post acute stay.  I will sign off.  Please call if questions.  [place] Admissions Coordinator Cell [phone] Office [phone]

## 2013-12-30 NOTE — Progress Notes (Signed)
CSW continues to look for a SNF bed. At this time, there are no SNF bed offers. CSW called [place] and is awaiting calls back. [facility] and [facility] both stated they can not take patient at this time. CSW continues to follow.  Jeanette Caprice, MSW, [place]

## 2013-12-30 NOTE — Progress Notes (Addendum)
Patient: [name] / Admit Date: 12/26/2013 / Date of Encounter: 12/30/2013, 9:31 AM  Subjective  No CP or SOB.  Objective   Telemetry: NSR  Physical Exam: Blood pressure 128/75, pulse 83, temperature 99.2 F (37.3 C), temperature source Oral, resp. rate 18, height 6\' 2"  (1.88 m), weight 271 lb 6.4 oz (123.106 kg), SpO2 98.00%. General: Well developed, well nourished AAM in no acute distress. Body mass index is 34.83 kg/(m^2). Obese. Head: Normocephalic, atraumatic, sclera non-icteric, no xanthomas, nares are without discharge. Neck: JVP not elevated. Lungs: Clear bilaterally to auscultation without wheezes, rales, or rhonchi. Breathing is unlabored. Heart: RRR S1 S2 without murmurs, rubs, or gallops.  Abdomen: Soft, non-tender, non-distended with normoactive bowel sounds. No rebound/guarding. Extremities: No clubbing or cyanosis. No edema. Distal pedal pulses are 2+ and equal bilaterally. R radial site without complication, pulse in tact Neuro: Alert and oriented X 3 Psych:  Responds to questions appropriately with a normal affect.   Intake/Output Summary (Last 24 hours) at 12/30/13 0931 Last data filed at 12/30/13 0800  Gross per 24 hour  Intake    480 ml  Output      0 ml  Net    480 ml    Inpatient Medications:  . aspirin EC  81 mg Oral Daily  . heparin  5,000 Units Subcutaneous 3 times per day  . insulin aspart  0-15 Units Subcutaneous TID WC  . lisinopril  10 mg Oral Daily  . NIFEdipine  30 mg Oral Daily  . simvastatin  20 mg Oral q1800   Infusions:    Labs:  Recent Labs  12/30/13 0336  NA 136*  K 4.2  CL 98  CO2 24  GLUCOSE 159*  BUN 11  CREATININE 1.00  CALCIUM 9.2   No results found for this basename: AST, ALT, ALKPHOS, BILITOT, PROT, ALBUMIN,  in the last 72 hours  Recent Labs  12/30/13 0336  WBC 6.7  HGB 13.2  HCT 40.0  MCV 83.9  PLT 210   No results found for this basename: CKTOTAL, CKMB, TROPONINI,  in the last 72 hours No components  found with this basename: POCBNP,    Recent Labs  12/28/13 1340  HGBA1C 7.4*     Radiology/Studies:  Ct Head Wo Contrast  12/26/2013   CLINICAL DATA:  Fall with numbness.  EXAM: CT HEAD WITHOUT CONTRAST  CT CERVICAL SPINE WITHOUT CONTRAST  TECHNIQUE: Multidetector CT imaging of the head and cervical spine was performed following the standard protocol without intravenous contrast. Multiplanar CT image reconstructions of the cervical spine were also generated.  COMPARISON:  04/27/2012  FINDINGS: CT HEAD FINDINGS  Skull and Sinuses:No acute fracture or destructive process. Bilateral middle concha bullosa causing anterior nasal cavity effacement. There is scarring in the posterior scalp as seen previously. There are also multiple rounded subcutaneous nodules in the occipital region, consistent with dermal inclusion cysts.  Orbits: No acute abnormality.  Brain: No evidence of acute abnormality, such as acute infarction, hemorrhage, hydrocephalus, or mass lesion/mass effect. Age advanced generalized brain atrophy. 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  CT CERVICAL SPINE FINDINGS  Negative for acute fracture or subluxation. No prevertebral edema. No gross cervical canal hematoma.  Degenerative disc narrowing at C5-6, C6-7, and C7-T1. Small posterior disc osteophyte complexes present at these levels. There is uncovertebral spurring, most notable on the left at C7-T1 where there is mild osseous foraminal narrowing.  IMPRESSION: 1. No acute intracranial  or cervical spine injury. 2. Generalized brain atrophy. 3. Congenital absence of the septum pellucidum.   Electronically Signed   By: Jorje Guild M.D.   On: 12/26/2013 02:40   Ct Cervical Spine Wo Contrast  12/26/2013   CLINICAL DATA:  Fall with numbness.  EXAM: CT HEAD WITHOUT CONTRAST  CT CERVICAL SPINE WITHOUT CONTRAST  TECHNIQUE: Multidetector CT imaging of the head and cervical spine was  performed following the standard protocol without intravenous contrast. Multiplanar CT image reconstructions of the cervical spine were also generated.  COMPARISON:  04/27/2012  FINDINGS: CT HEAD FINDINGS  Skull and Sinuses:No acute fracture or destructive process. Bilateral middle concha bullosa causing anterior nasal cavity effacement. There is scarring in the posterior scalp as seen previously. There are also multiple rounded subcutaneous nodules in the occipital region, consistent with dermal inclusion cysts.  Orbits: No acute abnormality.  Brain: No evidence of acute abnormality, such as acute infarction, hemorrhage, hydrocephalus, or mass lesion/mass effect. Age advanced generalized brain atrophy. 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  CT CERVICAL SPINE FINDINGS  Negative for acute fracture or subluxation. No prevertebral edema. No gross cervical canal hematoma.  Degenerative disc narrowing at C5-6, C6-7, and C7-T1. Small posterior disc osteophyte complexes present at these levels. There is uncovertebral spurring, most notable on the left at C7-T1 where there is mild osseous foraminal narrowing.  IMPRESSION: 1. No acute intracranial or cervical spine injury. 2. Generalized brain atrophy. 3. Congenital absence of the septum pellucidum.   Electronically Signed   By: Jorje Guild M.D.   On: 12/26/2013 02:40   Mr Brain Wo Contrast  12/27/2013   CLINICAL DATA:  Weakness in the upper and lower extremities. Status post fall.  EXAM: MRI HEAD WITHOUT CONTRAST  TECHNIQUE: Multiplanar, multiecho pulse sequences of the brain and surrounding structures were obtained without intravenous contrast.  COMPARISON:  CT head and cervical spine 12/26/2013. MRI brain 04/27/2012.  FINDINGS: No acute infarct, hemorrhage, or mass lesion is present. Absence of the septum pellucidum is again noted. To moderate generalized atrophy is evident. Multiple cystic lesions  are noted within the scalp. There is some restricted diffusion. These likely represent sebaceous cyst, possible infected.  The ventricles are proportionate to the degree of atrophy. No significant extra-axial fluid collection is present.  Flow is present in the major intracranial arteries. The globes and orbits are intact. Mild mucosal thickening is present in the maxillary sinuses bilaterally.  IMPRESSION: 1. Stable appearance of moderate generalized atrophy. 2. No acute intracranial abnormality. 3. Congenital absence of the septum pellucidum. 4. Multiple cystic lesions within the scalp likely represent sebaceous cysts. The may be infected.   Electronically Signed   By: Lawrence Santiago M.D.   On: 12/27/2013 11:40   Mr Cervical Spine Wo Contrast  12/27/2013   CLINICAL DATA:  Fall.  Weakness of the arms and legs.  EXAM: MRI CERVICAL SPINE WITHOUT CONTRAST  TECHNIQUE: Multiplanar, multisequence MR imaging of the cervical spine was performed. No intravenous contrast was administered.  COMPARISON:  CT of the cervical spine 12/26/2013.  FINDINGS: Focal T2 cord signal abnormality is present posterior to C5 and the C5-6 disc level. This is more prominent left than right. There is no significant expansion of the scratch the there slight expansion of the cord at this level.  Cord signal is otherwise normal to the lowest imaged level, T3-4.  Chronic endplate marrow changes are present at C6-7 and C7-T1. Vertebral body heights and  alignment are maintained. The craniocervical junction is within normal limits. Flow is present in the vertebral arteries bilaterally.  C2-3: Mild left-sided uncovertebral and facet spurring is present without significant stenosis.  C3-4: Mild uncovertebral spurring is present bilaterally. There is some facet hypertrophy as well. This results in mild foraminal and central canal narrowing, right greater than left.  C4-5: A leftward disc osteophyte complex is present. There is mild central and left  foraminal narrowing.  C5-6: A leftward disc osteophyte complex is present. This effaces in contacts the left ventral surface the cord. Mild to moderate left and mild right foraminal narrowing is due to uncovertebral spurring.  C6-7: A broad-based disc osteophyte complex is present. There is effacement of the ventral CSF. Mild foraminal narrowing is worse on the left.  C7-T1: Uncovertebral spurring leads to moderate foraminal stenosis bilaterally, worse on the left.  T1-2: Negative.  IMPRESSION: 1. Focal cord signal abnormality posterior to the C5-6 disc level and C5 vertebral body representing cord edema likely secondary to a acute cord injury. There is no associated hemorrhage. 2. Leftward disc osteophyte complex at C5-6 contacts the left ventral surface the cord at the level of the cord signal abnormality. 3. Moderate left and mild right foraminal narrowing at C5-6. 4. No acute osseous abnormality. 5. Mild foraminal and central canal stenosis at C3-4 is worse on the right. 6. Mild central and left foraminal narrowing at C4-5. 7. Mild central and foraminal narrowing bilaterally at C6-7 is worse on the left. 8. Moderate foraminal stenosis bilaterally at C7-T1 due to uncovertebral spurring, worse on the left.   Electronically Signed   By: Lawrence Santiago M.D.   On: 12/27/2013 11:56     Assessment and Plan  1. 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 Medical therapy recommended for CAD including ASA and statin. No BB given cocaine abuse. 2D Echo shows EF 50-55%, cannot exclude WMA, grade 1 diastolic dysfunction. 2. Acute intoxication/polysubstance abuse with + cocaine, EtOH - cessation advised. 3. L hemiparesis/spinal cord contusion - per neuro/IM. 4. Diabetes mellitus - uncontrolled with A1C of 7.4. 5. Homelessness - SW has seen patient.  No further active cardiac problems. Please call with questions. The patient can f/u in our  clinic for his moderate CAD after discharge, just let us or Trish know when patient is being discharged.  Signed, Melina Copa PA-C  Patient seen and examined. Agree with assessment and plan. No recurrent chest pain. Discussed importance of avoidance of cocaine and cardiovascular consequences including spasm and death. Will sign off.   Troy Sine, MD, [facility] 12/30/2013 12:21 PM

## 2013-12-31 LAB — GLUCOSE, CAPILLARY
GLUCOSE-CAPILLARY: 168 mg/dL — AB (ref 70–99)
GLUCOSE-CAPILLARY: 171 mg/dL — AB (ref 70–99)
Glucose-Capillary: 157 mg/dL — ABNORMAL HIGH (ref 70–99)
Glucose-Capillary: 128 mg/dL — ABNORMAL HIGH (ref 70–99)

## 2013-12-31 LAB — VITAMIN B1: Vitamin B1 (Thiamine): 163 nmol/L — ABNORMAL HIGH (ref 8–30)

## 2013-12-31 NOTE — Progress Notes (Signed)
PT Note Pt would benefit from OT consult.  Please order if you agree.  Thanks.  [facility] Acute Rehabilitation [phone] (pager)

## 2013-12-31 NOTE — Progress Notes (Signed)
Physical Therapy Treatment Patient Details Name: [name] MRN: [id number] DOB: Mar 11, 1961 Today's Date: 12/31/2013    History of Present Illness Pt. is a 53 year old homeless male admitted 12/26/13 with STEMI, AMS, acute intoxication, potential cocaine abuse, contusion to forehead.  He was found down in woods, cord edema at C5-6 thought due to his fall with resultant weakness.  NS says no immediate intervention from surgical standpoint.  Neurology requesting PT consult.      PT Comments    Pt admitted with above. Pt currently with functional limitations due to balance and endurance deficits as well as left LE weakness and ataxia bil. Pt will benefit from skilled PT to increase their independence and safety with mobility to allow discharge to the venue listed below.   Follow Up Recommendations  Supervision for mobility/OOB;SNF     Equipment Recommendations  Other (comment) (TBA)    Recommendations for Other Services       Precautions / Restrictions Precautions Precautions: Fall;Cervical;Other (comment) (C5-6 cord edema) Precaution Comments: Instructed pt. in log rolling technique Restrictions Weight Bearing Restrictions: No    Mobility  Bed Mobility Overal bed mobility: Needs Assistance;+2 for physical assistance Bed Mobility: Rolling;Sidelying to Sit Rolling: Min assist Sidelying to sit: Min assist       General bed mobility comments:   A little less assist needed today.    Transfers Overall transfer level: Needs assistance Equipment used: Ambulation equipment used [name] Plus) Transfers: Sit to/from Stand Sit to Stand: Mod assist;+2 physical assistance;From elevated surface         General transfer comment: Used [name] for pt to perform sit to stand.  Pt needed initial push for power up but then he came up.  Cued to prevent hyperextension but pt needed continued cuing for this.  Pt able to control descent into chair when finished.  Footplate removed for  ambulation.   Ambulation/Gait Ambulation/Gait assistance: Min assist;+2 physical assistance Ambulation Distance (Feet): 10 Feet Assistive device:  ([name]) Gait Pattern/deviations: Step-to pattern;Decreased stride length;Decreased step length - right;Decreased step length - left;Ataxic   Gait velocity interpretation: Below normal speed for age/gender General Gait Details: Left the knee plate on the [name] which helped pt for knee stability.  Noted hyperextension of knees (left worse than right) at times therefore didn't have pt continue long distance ambulation.  Pt somewhat ataxic with gait.  Pt was able to maintain trunk posture with ambulation.     Stairs            Wheelchair Mobility    Modified Rankin (Stroke Patients Only)       Balance Overall balance assessment: Needs assistance;History of Falls Sitting-balance support: No upper extremity supported;Feet supported Sitting balance-Leahy Scale: Fair     Standing balance support: Bilateral upper extremity supported;During functional activity Standing balance-Leahy Scale: Poor Standing balance comment: Continues to rely on [name] for standing balance but is able to stand without footplate on today.  Can shift weight and pick up LEs but had more difficulty with moving left LE.                      Cognition Arousal/Alertness: Awake/alert Behavior During Therapy: WFL for tasks assessed/performed Overall Cognitive Status: Within Functional Limits for tasks assessed                      Exercises General Exercises - Lower Extremity Ankle Circles/Pumps: AROM;Both;10 reps;Seated Long Arc Quad: AROM;Both;10  reps;Seated Hip Flexion/Marching: AROM;Both;10 reps;Seated    General Comments        Pertinent Vitals/Pain VSS, no pain    Home Living                      Prior Function            PT Goals (current goals can now be found in the care plan section) Progress towards PT  goals: Progressing toward goals    Frequency  Min 4X/week    PT Plan Current plan remains appropriate    Co-evaluation             End of Session Equipment Utilized During Treatment: Gait belt Activity Tolerance: Patient limited by fatigue Patient left: in chair;with call bell/phone within reach     Time: 1146-1210 PT Time Calculation (min): 24 min  Charges:  $Gait Training: 8-22 mins $Therapeutic Exercise: 8-22 mins                    G Codes:      [name],[name] 2014-01-03, 12:58 PM [facility] Acute [facility] [phone] (pager)

## 2013-12-31 NOTE — Progress Notes (Addendum)
CSW consulted assistant social work Librarian, academic for assistance in searching for a SNF bed. CSW continues to follow patient to look for a SNF bed. CSW updated patient.  Jeanette Caprice, MSW, [place]

## 2013-12-31 NOTE — Progress Notes (Signed)
Progress Note  [name]:[id number] DOB: December 30, 1960 DOA: 12/26/2013 PCP: Pcp Not In System  Admit HPI / Brief Narrative: 53 y.o.BM PMHx cocaine abuse, HTN, HLD, diabetes type 2,tobacco abuse, who presented to [facility] on 12/26/2013 with complaints of whole body numbness and weakness. History is limited as patient appears to be drunk. He reports that him and his "brothers" were drinking and likely using cocaine this evening. Was found down but not unconscious in the grass by paramedics. Unclear if tripped or had syncope. Hematoma to forehead and minor scrapping on his knees. UDS positive cocaine, EtOH= 274 on admission  HPI/Subjective: No change  Assessment/Plan: STEMI  Cath negative. Likely cocaine related  Left hemiparesis/spinal cord contusion/ -Patient is to followup with Dr. Sherley Bounds (neurosurgery) within 2 weeks after discharge for evaluation for future surgery. Awaiting SNF. D/c telemetry  Diabetes Type 2 Resume metformin. Cont SSI  Hyperlipidemia  On statin  Polysubstance abuse  - Social work consult inpatient/outpatient rehabilitation  -Patient UDS positive for cocaine, EtOH= 274 on admission  Code Status: FULL Family Communication: no family present at time of exam Disposition Plan: SNF  Consultants: Dr. Alexis Goodell (neurology) Dr. Sanda Klein (cardiology)  Dr Sherley Bounds (Neurosurgery)   Procedure/Significant Events: 6/5 cardiac catheterization;Mild disease RCA, Moderate disease small caliber intermediate branch  - Normal LV systolic function 6/5 CT C-spine without contrast/head CT without contrast; No acute intracranial or cervical spine injury. Generalized brain atrophy.Congenital absence septum pellucidum 6/5 MRI brain without contrast; Stable moderate generalized atrophy.No acute intracranial abnormality.  -Multiple cystic lesions within scalp likely sebaceous cysts; May be infected. 6/6 MRI C-spine without contrast;Focal cord  signal abnormality posterior to the C5-6 disc level  and C5 vertebral body representing cord edema secondary to acute cord injury. no hemorrhage.  -Leftward disc osteophyte complex at C5-6 contacts left ventral surface the cord at the level of the cord signal abnormality.  -Moderate left/mild right foraminal narrowing at C5-6.     Culture 6/5 on: MRSA by PCR negative  Antibiotics: NA  DVT prophylaxis: Heparin   Devices None  LINES / TUBES:  6/5 ga left hand    Objective: VITAL SIGNS: Temp: 97.2 F (36.2 C) (06/10 1359) Temp src: Oral (06/10 1359) BP: 115/58 mmHg (06/10 1359) Pulse Rate: 76 (06/10 1359)    Intake/Output Summary (Last 24 hours) at 12/31/13 1438 Last data filed at 12/31/13 1300  Gross per 24 hour  Intake    480 ml  Output   1850 ml  Net  -1370 ml     Exam: General: A./O. x4, NAD, in chair Lungs: Clear to auscultation bilaterally without wheezes or crackles Cardiovascular: Regular rate and rhythm without murmur gallop or rub normal S1 and S2 Abdomen: Nontender, nondistended, soft, bowel sounds positive, no rebound, no ascites, no appreciable mass Extremities: No significant cyanosis, clubbing, or edema bilateral lower extremities Neurologic; CN II-XII intact,  Lt Hemiparesis with contracture Lt Hand Lt Arm strength 3/5, Lt Leg 4/5  Data Reviewed: Basic Metabolic Panel:  Recent Labs Lab 12/26/13 0216 12/27/13 0312 12/30/13 0336  NA 140  --  136*  K 3.6*  --  4.2  CL 101  --  98  CO2 22  --  24  GLUCOSE 231*  --  159*  BUN 10  --  11  CREATININE 1.04  --  1.00  CALCIUM 8.7  --  9.2  MG  --  1.6  --   PHOS  --  2.7  --  Liver Function Tests: No results found for this basename: AST, ALT, ALKPHOS, BILITOT, PROT, ALBUMIN,  in the last 168 hours No results found for this basename: LIPASE, AMYLASE,  in the last 168 hours No results found for this basename: AMMONIA,  in the last 168 hours CBC:  Recent Labs Lab 12/26/13 0216  12/30/13 0336  WBC 7.3 6.7  NEUTROABS 3.7  --   HGB 13.1 13.2  HCT 38.6* 40.0  MCV 84.1 83.9  PLT 200 210   Cardiac Enzymes:  Recent Labs Lab 12/26/13 1200 12/26/13 1620 12/26/13 2250  TROPONINI <0.30 <0.30 <0.30   BNP (last 3 results) No results found for this basename: PROBNP,  in the last 8760 hours CBG:  Recent Labs Lab 12/30/13 1139 12/30/13 1641 12/30/13 2127 12/31/13 0612 12/31/13 1127  GLUCAP 173* 211* 155* 168* 171*    Recent Results (from the past 240 hour(s))  MRSA PCR SCREENING     Status: None   Collection Time    12/26/13  3:50 AM      Result Value Ref Range Status   MRSA by PCR NEGATIVE  NEGATIVE Final   Comment:            The GeneXpert MRSA Assay (FDA     approved for NASAL specimens     only), is one component of a     comprehensive MRSA colonization     surveillance program. It is not     intended to diagnose MRSA     infection nor to guide or     monitor treatment for     MRSA infections.    Scheduled Meds:  Scheduled Meds: . aspirin EC  81 mg Oral Daily  . heparin  5,000 Units Subcutaneous 3 times per day  . insulin aspart  0-15 Units Subcutaneous TID WC  . lisinopril  10 mg Oral Daily  . metFORMIN  1,000 mg Oral BID WC  . NIFEdipine  30 mg Oral Daily  . simvastatin  20 mg Oral q1800   Time spent on care of this patient: 15 mins  Delfina Redwood , MD [facility]  Pager - [phone] If 7PM-7AM, please contact night-coverage [email or web address] Password [email or web address] 12/31/2013, 2:38 PM   LOS: 5 days

## 2014-01-01 ENCOUNTER — Encounter (HOSPITAL_COMMUNITY): Payer: Self-pay | Admitting: General Practice

## 2014-01-01 LAB — GLUCOSE, CAPILLARY
Glucose-Capillary: 159 mg/dL — ABNORMAL HIGH (ref 70–99)
Glucose-Capillary: 141 mg/dL — ABNORMAL HIGH (ref 70–99)
Glucose-Capillary: 155 mg/dL — ABNORMAL HIGH (ref 70–99)
Glucose-Capillary: 140 mg/dL — ABNORMAL HIGH (ref 70–99)

## 2014-01-01 LAB — MYASTHENIA GRAVIS PANEL 2: ACETYLCHOLINE MODULAT AB: 14 %

## 2014-01-01 NOTE — Progress Notes (Signed)
Progress Note  PRAYAN ULIN GHW:[id number] DOB: Oct 06, 1960 DOA: 12/26/2013 PCP: Pcp Not In System  Admit HPI / Brief Narrative: 53 y.o.BM PMHx cocaine abuse, HTN, HLD, diabetes type 2,tobacco abuse, who presented to [facility] on 12/26/2013 with complaints of whole body numbness and weakness. History is limited as patient appears to be drunk. He reports that him and his "brothers" were drinking and likely using cocaine this evening. Was found down but not unconscious in the grass by paramedics. Unclear if tripped or had syncope. Hematoma to forehead and minor scrapping on his knees. UDS positive cocaine, EtOH= 274 on admission  HPI/Subjective: No change  Assessment/Plan: STEMI  Cath negative. Likely cocaine related  Left hemiparesis/spinal cord contusion/ -Patient is to followup with Dr. Sherley Bounds (neurosurgery) within 2 weeks after discharge for evaluation for future surgery. Awaiting SNF. D/c telemetry  Diabetes Type 2 Adequate control.  Hyperlipidemia  On statin  Polysubstance abuse  - Social work consult inpatient/outpatient rehabilitation  -Patient UDS positive for cocaine, EtOH= 274 on admission  Awaiting placement. Stable  Code Status: FULL Family Communication: no family present at time of exam Disposition Plan: SNF  Consultants: Dr. Alexis Goodell (neurology) Dr. Sanda Klein (cardiology)  Dr Sherley Bounds (Neurosurgery)   Procedure/Significant Events: 6/5 cardiac catheterization;Mild disease RCA, Moderate disease small caliber intermediate branch  - Normal LV systolic function 6/5 CT C-spine without contrast/head CT without contrast; No acute intracranial or cervical spine injury. Generalized brain atrophy.Congenital absence septum pellucidum 6/5 MRI brain without contrast; Stable moderate generalized atrophy.No acute intracranial abnormality.  -Multiple cystic lesions within scalp likely sebaceous cysts; May be infected. 6/6 MRI C-spine without  contrast;Focal cord signal abnormality posterior to the C5-6 disc level  and C5 vertebral body representing cord edema secondary to acute cord injury. no hemorrhage.  -Leftward disc osteophyte complex at C5-6 contacts left ventral surface the cord at the level of the cord signal abnormality.  -Moderate left/mild right foraminal narrowing at C5-6.     Culture 6/5 on: MRSA by PCR negative  Antibiotics: NA  DVT prophylaxis: Heparin   Devices None  LINES / TUBES:  6/5 ga left hand    Objective: VITAL SIGNS: Temp: 97.6 F (36.4 C) (06/11 1400) Temp src: Oral (06/11 1400) BP: 130/75 mmHg (06/11 1400) Pulse Rate: 73 (06/11 1400)    Intake/Output Summary (Last 24 hours) at 01/01/14 1515 Last data filed at 01/01/14 1300  Gross per 24 hour  Intake    720 ml  Output    750 ml  Net    -30 ml     Exam: General: A./O. x4, NAD, in chair Lungs: Clear to auscultation bilaterally without wheezes or crackles Cardiovascular: Regular rate and rhythm without murmur gallop or rub normal S1 and S2 Abdomen: Nontender, nondistended, soft, bowel sounds positive, no rebound, no ascites, no appreciable mass Extremities: No significant cyanosis, clubbing, or edema bilateral lower extremities Neurologic; CN II-XII intact,  Lt Hemiparesis with contracture Lt Hand Lt Arm strength 3/5, Lt Leg 4/5  Data Reviewed: Basic Metabolic Panel:  Recent Labs Lab 12/26/13 0216 12/27/13 0312 12/30/13 0336  NA 140  --  136*  K 3.6*  --  4.2  CL 101  --  98  CO2 22  --  24  GLUCOSE 231*  --  159*  BUN 10  --  11  CREATININE 1.04  --  1.00  CALCIUM 8.7  --  9.2  MG  --  1.6  --   PHOS  --  2.7  --    Liver Function Tests: No results found for this basename: AST, ALT, ALKPHOS, BILITOT, PROT, ALBUMIN,  in the last 168 hours No results found for this basename: LIPASE, AMYLASE,  in the last 168 hours No results found for this basename: AMMONIA,  in the last 168 hours CBC:  Recent Labs Lab  12/26/13 0216 12/30/13 0336  WBC 7.3 6.7  NEUTROABS 3.7  --   HGB 13.1 13.2  HCT 38.6* 40.0  MCV 84.1 83.9  PLT 200 210   Cardiac Enzymes:  Recent Labs Lab 12/26/13 1200 12/26/13 1620 12/26/13 2250  TROPONINI <0.30 <0.30 <0.30   BNP (last 3 results) No results found for this basename: PROBNP,  in the last 8760 hours CBG:  Recent Labs Lab 12/31/13 1127 12/31/13 1631 12/31/13 2049 01/01/14 0655 01/01/14 1122  GLUCAP 171* 157* 128* 159* 141*    Recent Results (from the past 240 hour(s))  MRSA PCR SCREENING     Status: None   Collection Time    12/26/13  3:50 AM      Result Value Ref Range Status   MRSA by PCR NEGATIVE  NEGATIVE Final   Comment:            The GeneXpert MRSA Assay (FDA     approved for NASAL specimens     only), is one component of a     comprehensive MRSA colonization     surveillance program. It is not     intended to diagnose MRSA     infection nor to guide or     monitor treatment for     MRSA infections.    Scheduled Meds:  Scheduled Meds: . aspirin EC  81 mg Oral Daily  . heparin  5,000 Units Subcutaneous 3 times per day  . insulin aspart  0-15 Units Subcutaneous TID WC  . lisinopril  10 mg Oral Daily  . metFORMIN  1,000 mg Oral BID WC  . NIFEdipine  30 mg Oral Daily  . simvastatin  20 mg Oral q1800   Time spent on care of this patient: 15 mins  Delfina Redwood , MD [facility]  Pager - [phone] If 7PM-7AM, please contact night-coverage [email or web address] Password [email or web address] 01/01/2014, 3:15 PM   LOS: 6 days

## 2014-01-01 NOTE — Evaluation (Signed)
Occupational Therapy Evaluation Patient Details Name: [name] MRN: [id number] DOB: 07-26-1960 Today's Date: 01/01/2014    History of Present Illness 53 year old homeless male admitted 12/26/13 with STEMI, AMS, acute intoxication, potential cocaine abuse, contusion to forehead. He was found down in woods, cord edema at C5-6 thought due to his fall with resultant weakness. NS says no immediate intervention from surgical standpoint.     Clinical Impression   PT admitted with C5-6 cord edema s/p fall. Pt currently with functional limitiations due to the deficits listed below (see OT problem list). Homeless but PTA independent. Pt will benefit from skilled OT to increase their independence and safety with adls and balance to allow discharge SNF. OT to follow acutely for LT UE HEP, balance, and adl retraining.     Follow Up Recommendations  SNF    Equipment Recommendations  3 in 1 bedside comode;Wheelchair (measurements OT);Wheelchair cushion (measurements OT)    Recommendations for Other Services       Precautions / Restrictions Precautions Precautions: Cervical;Fall Precaution Comments: cervical precautions to log roll bed mobility      Mobility Bed Mobility Overal bed mobility: Modified Independent             General bed mobility comments: requires bed rail and HOB 20 degrees  Transfers Overall transfer level: Needs assistance Equipment used: Ambulation equipment used Transfers: Sit to/from Stand Sit to Stand: Min assist;+2 physical assistance         General transfer comment: used sara plus with foot plate in place. Pt was able to power up and familiar with machine.    Balance Overall balance assessment: Needs assistance Sitting-balance support: No upper extremity supported;Feet unsupported Sitting balance-Leahy Scale: Fair     Standing balance support: Bilateral upper extremity supported;During functional activity Standing balance-Leahy Scale: Poor                               ADL Overall ADL's : Needs assistance/impaired Eating/Feeding: Set up;Sitting Eating/Feeding Details (indicate cue type and reason): able to use Rt UE to eat with utensils                     Toilet Transfer: +2 for physical assistance;BSC (sara plus used for transfer )   Toileting- Water quality scientist and Hygiene: Total assistance       Functional mobility during ADLs: +2 for physical assistance (sara plus used) General ADL Comments: Pt static standing in sara plus for ~5 minutes this session. pt reports LT LE fatigue with prolonged standing. pt progressed to chair level. pt very concerned with ROM of Lt hand. Pt educated on digit flexion / extension PROM with RT UE at this time. OT to follow up with HEP for LT UE     Vision                     Perception     Praxis Praxis Praxis tested?: Not tested    Pertinent Vitals/Pain VSS     Hand Dominance Right   Extremity/Trunk Assessment Upper Extremity Assessment Upper Extremity Assessment: LUE deficits/detail LUE Deficits / Details: requires tenodesis grasp for digit flexion. Pt is unable to actively move digits in gravity eliminated and wrist neutral position. Pt with minimal thumb movement in this position. supination/ pronation wFL. wrist flexion/ extension wfl. shoulder flexion wfl elbow wfl LUE Coordination: decreased fine motor;decreased gross motor   Lower Extremity Assessment  Lower Extremity Assessment: Defer to PT evaluation   Cervical / Trunk Assessment Cervical / Trunk Assessment: Normal   Communication Communication Communication: No difficulties   Cognition Arousal/Alertness: Awake/alert Behavior During Therapy: WFL for tasks assessed/performed Overall Cognitive Status: Within Functional Limits for tasks assessed                     General Comments       Exercises Exercises: Hand exercises     Shoulder Instructions      Home Living  Family/patient expects to be discharged to:: Shelter/Homeless                                        Prior Functioning/Environment Level of Independence: Independent             OT Diagnosis: Generalized weakness;Paresis   OT Problem List: Decreased strength;Decreased range of motion;Decreased activity tolerance;Impaired balance (sitting and/or standing);Decreased safety awareness;Decreased knowledge of use of DME or AE;Decreased knowledge of precautions;Impaired sensation;Obesity;Impaired UE functional use   OT Treatment/Interventions: Self-care/ADL training;Therapeutic exercise;Neuromuscular education;DME and/or AE instruction;Therapeutic activities;Patient/family education;Balance training    OT Goals(Current goals can be found in the care plan section) Acute Rehab OT Goals Patient Stated Goal: to work on LT hand exercises OT Goal Formulation: With patient Time For Goal Achievement: 01/15/14 Potential to Achieve Goals: Good  OT Frequency: Min 3X/week   Barriers to D/C: Decreased caregiver support;Inaccessible home environment  pt homeless / no family / friend support upon d/c at this time       Co-evaluation              End of Session Nurse Communication: Mobility status;Precautions;Need for lift equipment  Activity Tolerance: Patient tolerated treatment well Patient left: in chair;with call bell/phone within reach   Time: 0949-1006 OT Time Calculation (min): 17 min Charges:  OT General Charges $OT Visit: 1 Procedure OT Evaluation $Initial OT Evaluation Tier I: 1 Procedure OT Treatments $Therapeutic Activity: 8-22 mins G-Codes:    Parke Poisson B January 27, 2014, 10:48 AM Pager: [phone]

## 2014-01-02 ENCOUNTER — Encounter: Payer: Self-pay | Admitting: Internal Medicine

## 2014-01-02 ENCOUNTER — Non-Acute Institutional Stay (SKILLED_NURSING_FACILITY): Payer: Self-pay | Admitting: Internal Medicine

## 2014-01-02 DIAGNOSIS — E785 Hyperlipidemia, unspecified: Secondary | ICD-10-CM

## 2014-01-02 DIAGNOSIS — Z87828 Personal history of other (healed) physical injury and trauma: Secondary | ICD-10-CM

## 2014-01-02 DIAGNOSIS — E119 Type 2 diabetes mellitus without complications: Secondary | ICD-10-CM

## 2014-01-02 DIAGNOSIS — IMO0001 Reserved for inherently not codable concepts without codable children: Secondary | ICD-10-CM

## 2014-01-02 DIAGNOSIS — IMO0002 Reserved for concepts with insufficient information to code with codable children: Secondary | ICD-10-CM

## 2014-01-02 DIAGNOSIS — I201 Angina pectoris with documented spasm: Secondary | ICD-10-CM

## 2014-01-02 DIAGNOSIS — F1410 Cocaine abuse, uncomplicated: Secondary | ICD-10-CM

## 2014-01-02 DIAGNOSIS — I10 Essential (primary) hypertension: Secondary | ICD-10-CM

## 2014-01-02 DIAGNOSIS — F1020 Alcohol dependence, uncomplicated: Secondary | ICD-10-CM

## 2014-01-02 HISTORY — DX: Personal history of other (healed) physical injury and trauma: Z87.828

## 2014-01-02 LAB — BASIC METABOLIC PANEL
BUN: 17 mg/dL (ref 6–23)
CALCIUM: 9.9 mg/dL (ref 8.4–10.5)
CO2: 23 mEq/L (ref 19–32)
Chloride: 100 mEq/L (ref 96–112)
Creatinine, Ser: 0.86 mg/dL (ref 0.50–1.35)
GFR calc Af Amer: >90 mL/min (ref >90)
GFR calc non Af Amer: >90 mL/min (ref >90)
GLUCOSE: 205 mg/dL — AB (ref 70–99)
Potassium: 4.9 mEq/L (ref 3.7–5.3)
SODIUM: 137 meq/L (ref 137–147)

## 2014-01-02 LAB — GLUCOSE, CAPILLARY
GLUCOSE-CAPILLARY: 179 mg/dL — AB (ref 70–99)
Glucose-Capillary: 144 mg/dL — ABNORMAL HIGH (ref 70–99)

## 2014-01-02 LAB — CK: Total CK: 136 U/L (ref 7–232)

## 2014-01-02 LAB — AMMONIA: AMMONIA: 19 umol/L (ref 11–60)

## 2014-01-02 LAB — MAGNESIUM: Magnesium: 1.8 mg/dL (ref 1.5–2.5)

## 2014-01-02 MED ORDER — BACLOFEN 5 MG HALF TABLET
5.0000 mg | ORAL_TABLET | Freq: Once | ORAL | Status: AC
  Administered 2014-01-02: 5 mg via ORAL
  Filled 2014-01-02: qty 1

## 2014-01-02 MED ORDER — LISINOPRIL 10 MG PO TABS: 10.0000 mg | ORAL_TABLET | Freq: Every day | ORAL | Status: DC

## 2014-01-02 MED ORDER — ACETAMINOPHEN 325 MG PO TABS: 650.0000 mg | ORAL_TABLET | ORAL | Status: DC | PRN

## 2014-01-02 MED ORDER — ASPIRIN 81 MG PO TBEC: 81.0000 mg | DELAYED_RELEASE_TABLET | Freq: Every day | ORAL | Status: DC

## 2014-01-02 MED ORDER — SIMVASTATIN 20 MG PO TABS: 20.0000 mg | ORAL_TABLET | Freq: Every day | ORAL | Status: DC

## 2014-01-02 MED ORDER — BACLOFEN 5 MG HALF TABLET: 5.0000 mg | ORAL_TABLET | Freq: Three times a day (TID) | ORAL | Status: DC | PRN

## 2014-01-02 NOTE — Progress Notes (Signed)
Clinical Social Worker facilitated patient discharge including contacting patient and facility to confirm patient discharge plans.  Clinical information faxed to facility and patient agreeable with plan.  CSW arranged ambulance transport via [facility] to [facility] for 2pm.  RN to call report prior to discharge.  Clinical Social Worker will sign off for now as social work intervention is no longer needed. Please consult [name] again if new need arises.  Jeanette Caprice, MSW, [place]

## 2014-01-02 NOTE — Progress Notes (Addendum)
Update: [facility] confirmed availability for today dc. CSW notified patient and patient is agreeable to plan.   CSW and assistant social work Librarian, academic found patient a SNF bed at [facility] and [facility]. CSW informed MD and is awaiting [facility] to confirm availability for today.
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
## 2014-01-02 NOTE — Progress Notes (Signed)
Report given to nurse at [facility]. Questions answered. Pt discharge with PTARR. Pt a/ox3

## 2014-01-02 NOTE — Care Management Note (Signed)
    Page 1 of 1   01/02/2014     3:47:08 PM CARE MANAGEMENT NOTE 01/02/2014  Patient:  [name], [name]   Account Number:  [id number]  Date Initiated:  12/26/2013  Documentation initiated by:  Elissa Hefty  Subjective/Objective Assessment:   adm w mi     Action/Plan:   lives alone/homeless   Anticipated DC Date:  01/02/2014   Anticipated DC Plan:  SKILLED NURSING FACILITY  In-house referral  Clinical Social Worker      DC Planning Services  CM consult      Choice offered to / List presented to:             Status of service:  Completed, signed off Medicare Important Message given?   (If response is "NO", the following Medicare IM given date fields will be blank) Date Medicare IM given:   Date Additional Medicare IM given:    Discharge Disposition:  [place]  Per UR Regulation:  Reviewed for med. necessity/level of care/duration of stay  If discussed at [place] of Stay Meetings, dates discussed:   01/01/2014    Comments:  01/02/14 Ellan Lambert, RN, BSN [phone] Pt discharged to SNF today, per CSW arrangements.

## 2014-01-02 NOTE — Progress Notes (Signed)
Clinical Social Work Department CLINICAL SOCIAL WORK PLACEMENT NOTE 01/02/2014  Patient:  [name], [name]  Account Number:  [id number] Admit date:  12/26/2013  Clinical Social Worker:  Megan Salon  Date/time:  12/29/2013 12:54 PM  Clinical Social Work is seeking post-discharge placement for this patient at the following level of care:   [place]   (*CSW will update this form in Epic as items are completed)   12/29/2013  Patient/family provided with [place] Department of Clinical Social Work's list of facilities offering this level of care within the geographic area requested by the patient (or if unable, by the patient's family).  12/29/2013  Patient/family informed of their freedom to choose among providers that offer the needed level of care, that participate in Medicare, Medicaid or managed care program needed by the patient, have an available bed and are willing to accept the patient.  12/29/2013  Patient/family informed of [facility]' ownership interest in [facility], as well as of the fact that they are under no obligation to receive care at this facility.  PASARR submitted to EDS on 12/29/2013 PASARR number received on 12/29/2013  FL2 transmitted to all facilities in geographic area requested by pt/family on  12/29/2013 FL2 transmitted to all facilities within larger geographic area on   Patient informed that his/her managed care company has contracts with or will negotiate with  certain facilities, including the following:     Patient/family informed of bed offers received:  01/02/2014 Patient chooses bed at [facility] Physician recommends and patient chooses bed at    Patient to be transferred to [facility] on  01/02/2014 Patient to be transferred to facility by EMS Patient and family notified of transfer on  Name of family member notified:    The following physician request were entered in  Epic:   Additional Comments: Patient stated social worker did not need to call family.  Jeanette Caprice, MSW, [place]

## 2014-01-05 ENCOUNTER — Encounter: Payer: Self-pay | Admitting: Internal Medicine
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
## 2014-01-05 NOTE — Assessment & Plan Note (Signed)
A1c was a surprising 7.4;pt back on metformin and Zocor for LDL 108

## 2014-01-05 NOTE — Assessment & Plan Note (Signed)
Zocor 20 mg for LDL 108

## 2014-01-05 NOTE — Assessment & Plan Note (Signed)
HUGE problem and expect it to be a problem in future as it is part of polysubstance abuse with ETOH; anytime it is used vasospasm and MI could result

## 2014-01-05 NOTE — Assessment & Plan Note (Signed)
Continue lisinopril 10 mg

## 2014-01-05 NOTE — Assessment & Plan Note (Signed)
Was noted to have left hemiparesis and workup showed cervical spinal cord contusion likely from fall while intoxicated. Neurosurgery was consulted and felt surgery was not needed acutely, but would be considered in the future Pt's L hemiparesis is improved but needs assist ADL hence OT/PT

## 2014-01-05 NOTE — Assessment & Plan Note (Signed)
In addition to cocaine abuse;withdrawal not noted at hospital

## 2014-01-05 NOTE — Assessment & Plan Note (Signed)
EKG revealed NSR with STE in I and AVL and V2-V2 with depressions in III and AVF. Labs pending.  STEMI alert called and pt urgently went to the cath lab --> no obstructive disease Patient was taken emergently to the Cath Lab, mild disease in RCA, moderate disease in small caliber intermediate branch, normal EF. STEMI Felt to be 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. LDL was above 100 so he was started on a statin. Aspirin therapy which will continue

## 2014-02-14 ENCOUNTER — Non-Acute Institutional Stay (SKILLED_NURSING_FACILITY): Payer: Medicaid Other | Admitting: Internal Medicine

## 2014-02-14 ENCOUNTER — Encounter: Payer: Self-pay | Admitting: Internal Medicine

## 2014-02-14 DIAGNOSIS — IMO0002 Reserved for concepts with insufficient information to code with codable children: Secondary | ICD-10-CM

## 2014-02-14 DIAGNOSIS — F1020 Alcohol dependence, uncomplicated: Secondary | ICD-10-CM

## 2014-02-14 DIAGNOSIS — I10 Essential (primary) hypertension: Secondary | ICD-10-CM

## 2014-02-14 DIAGNOSIS — E119 Type 2 diabetes mellitus without complications: Secondary | ICD-10-CM

## 2014-02-14 DIAGNOSIS — IMO0001 Reserved for inherently not codable concepts without codable children: Secondary | ICD-10-CM

## 2014-02-14 DIAGNOSIS — E785 Hyperlipidemia, unspecified: Secondary | ICD-10-CM

## 2014-02-14 DIAGNOSIS — I201 Angina pectoris with documented spasm: Secondary | ICD-10-CM

## 2014-02-14 NOTE — Assessment & Plan Note (Signed)
Pt has not has access to cocaine as far as I know

## 2014-02-14 NOTE — Assessment & Plan Note (Signed)
Controlled on lisinopril.

## 2014-02-14 NOTE — Assessment & Plan Note (Signed)
None recently as far as I know

## 2014-02-14 NOTE — Assessment & Plan Note (Signed)
Still using wheel chair

## 2014-02-14 NOTE — Assessment & Plan Note (Signed)
Continues on zocor

## 2014-02-14 NOTE — Assessment & Plan Note (Signed)
No problems, too soon to recheck A1c

## 2014-02-14 NOTE — Progress Notes (Signed)
MRN: [id number] Name: [name]  Sex: male Age: 53 y.o. DOB: [birth year]  [place] #: [facility] Facility/Room: 313 Level Of Care: SNF Alexius [place]ington: Inocencio Homes D Emergency Contacts: Extended Emergency Contact Information Primary Emergency Contact: Boze,Ladonna Address: [address], [place] [address] [place] Phone: [phone] Relation: Sister Secondary Emergency Contact: Arna Medici, [place] Phone: [phone] Relation: Relative  Code Status: FULL  Allergies: Review of patient's allergies indicates no known allergies.  Chief Complaint  Patient presents with  . Hospitalization Follow-up    HPI: Patient is 53 y.o. male who was admitted to SNF after complications of substance abuse being seen for 1 month  post hospitalization.  Past Medical History  Diagnosis Date  . Diabetes mellitus   . Hypertension     Past Surgical History  Procedure Laterality Date  . Cardiac catheterization  12/27/2013      Medication List       This list is accurate as of: 02/14/14  9:18 PM.  Always use your most recent med list.               acetaminophen 325 MG tablet  Commonly known as:  TYLENOL  Take 2 tablets (650 mg total) by mouth every 4 (four) hours as needed for mild pain or fever.     aspirin 81 MG EC tablet  Take 1 tablet (81 mg total) by mouth daily.     baclofen 5 mg Tabs tablet  Commonly known as:  LIORESAL  Take 0.5 tablets (5 mg total) by mouth 3 (three) times daily as needed for muscle spasms.     lisinopril 10 MG tablet  Commonly known as:  PRINIVIL,ZESTRIL  Take 1 tablet (10 mg total) by mouth daily.     metFORMIN 1000 MG tablet  Commonly known as:  GLUCOPHAGE  Take 1,000 mg by mouth 2 (two) times daily with a meal.     simvastatin 20 MG tablet  Commonly known as:  ZOCOR  Take 1 tablet (20 mg total) by mouth daily at 6 PM.        No orders of the defined  types were placed in this encounter.     There is no immunization history on file for this patient.  History  Substance Use Topics  . Smoking status: Current Every Day Smoker -- 0.25 packs/day    Types: Cigarettes  . Smokeless tobacco: Never Used  . Alcohol Use: Yes     Comment: occassional    Review of Systems  DATA OBTAINED: from patient GENERAL: Feels well no fevers, fatigue, appetite changes SKIN: No itching, rash HEENT: No complaint RESPIRATORY: No cough, wheezing, SOB CARDIAC: No chest pain, palpitations, lower extremity edema  GI: No abdominal pain, No N/V/D or constipation, No heartburn or reflux  GU: No dysuria, frequency or urgency, or incontinence  MUSCULOSKELETAL: No unrelieved bone/joint pain NEUROLOGIC: No headache, dizziness or focal weakness PSYCHIATRIC: No overt anxiety or sadness. Sleeps well.   Filed Vitals:   02/14/14 2012  BP: 142/86  Pulse: 70  Temp: 98.6 F (37 C)  Resp: 18    Physical Exam  GENERAL APPEARANCE: Alert, conversant. Appropriately groomed. No acute distress  SKIN: No diaphoresis rash HEENT: Unremarkable RESPIRATORY: Breathing is even, unlabored. Lung sounds are clear   CARDIOVASCULAR: Heart RRR no murmurs, rubs or gallops. No peripheral edema  GASTROINTESTINAL: Abdomen is soft, non-tender, not distended w/ normal bowel sounds.  GENITOURINARY: Bladder non tender, not distended  MUSCULOSKELETAL: no abn NEUROLOGIC: Cranial nerves 2-12 grossly intact; L side weakness PSYCHIATRIC: Mood and affect appropriate to situation, no behavioral issues  Patient Active Problem List   Diagnosis Date Noted  . Coronary vasospasm, cocaine related 12/29/2013  . Cocaine abuse 12/28/2013  . Alcoholism /alcohol abuse 12/28/2013  . Spinal cord contusion 12/27/2013  . Altered mental status 12/26/2013  . Weakness 12/26/2013  . HYPERLIPIDEMIA 04/01/2007  . DIABETES MELLITUS, TYPE II 03/28/2007  . OBESITY NOS 03/28/2007  . HYPERTENSION 03/28/2007     CBC    Component Value Date/Time   WBC 6.7 12/30/2013 0336   RBC 4.77 12/30/2013 0336   HGB 13.2 12/30/2013 0336   HCT 40.0 12/30/2013 0336   PLT 210 12/30/2013 0336   MCV 83.9 12/30/2013 0336   LYMPHSABS 2.9 12/26/2013 0216   MONOABS 0.5 12/26/2013 0216   EOSABS 0.2 12/26/2013 0216   BASOSABS 0.1 12/26/2013 0216    CMP     Component Value Date/Time   NA 137 01/02/2014 1155   K 4.9 01/02/2014 1155   CL 100 01/02/2014 1155   CO2 23 01/02/2014 1155   GLUCOSE 205* 01/02/2014 1155   BUN 17 01/02/2014 1155   CREATININE 0.86 01/02/2014 1155   CALCIUM 9.9 01/02/2014 1155   PROT 8.0 04/27/2012 1531   ALBUMIN 4.3 04/27/2012 1531   AST 15 04/27/2012 1531   ALT 16 04/27/2012 1531   ALKPHOS 64 04/27/2012 1531   BILITOT 0.4 04/27/2012 1531   GFRNONAA >90 01/02/2014 1155   GFRAA >90 01/02/2014 1155    Assessment and Plan  HYPERTENSION Controlled on lisinopril  Coronary vasospasm, cocaine related Pt has not has access to cocaine as far as I know  DIABETES MELLITUS, TYPE II No problems, too soon to recheck A1c  Spinal cord contusion Still using wheel chair  Alcoholism /alcohol abuse None recently as far as I know  HYPERLIPIDEMIA Continues on zocor    Hennie Duos, MD

## 2014-02-19 ENCOUNTER — Encounter: Payer: Self-pay | Admitting: Internal Medicine

## 2014-02-19 ENCOUNTER — Non-Acute Institutional Stay (SKILLED_NURSING_FACILITY): Payer: Medicaid Other | Admitting: Internal Medicine

## 2014-02-19 DIAGNOSIS — I10 Essential (primary) hypertension: Secondary | ICD-10-CM

## 2014-02-19 DIAGNOSIS — R5381 Other malaise: Secondary | ICD-10-CM

## 2014-02-19 DIAGNOSIS — E785 Hyperlipidemia, unspecified: Secondary | ICD-10-CM

## 2014-02-19 DIAGNOSIS — R5383 Other fatigue: Secondary | ICD-10-CM

## 2014-02-19 DIAGNOSIS — R531 Weakness: Secondary | ICD-10-CM

## 2014-02-19 DIAGNOSIS — IMO0002 Reserved for concepts with insufficient information to code with codable children: Secondary | ICD-10-CM

## 2014-02-19 DIAGNOSIS — E119 Type 2 diabetes mellitus without complications: Secondary | ICD-10-CM

## 2014-02-19 DIAGNOSIS — I201 Angina pectoris with documented spasm: Secondary | ICD-10-CM

## 2014-02-19 NOTE — Progress Notes (Signed)
Patient ID: [name], male   DOB: [birth year], 53 y.o.   MRN: [id number]   this is a discharge note.  Level of care skilled.  Facility AF.   Chief Complaint   Patient presents with   .   discharge note   HPI: Patient is 53 y.o. male who was admitted to SNF after complications of substance abuse -he had chest pains apparently but this was thought due to secondary to coronary vasospasm with history of cocaine use.  Also had left-sided weakness which has improved some with therapy.  This was thought secondary to a spinal cord contusion.  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  He is a diabetic on Glucophage blood sugars have been satisfactory--largely in the low  100s  Today he has no complaints denies any chest pain or shortness of breath still has some residual left-sided weakness but apparently this has improved     Past Medical History   Diagnosis  Date   .  Diabetes mellitus    .  Hypertension     Past Surgical History   Procedure  Laterality  Date   .  Cardiac catheterization   12/27/2013      Medication List                     acetaminophen 325 MG tablet    Commonly known as: TYLENOL    Take 2 tablets (650 mg total) by mouth every 4 (four) hours as needed for mild pain or fever.    aspirin 81 MG EC tablet    Take 1 tablet (81 mg total) by mouth daily.    baclofen 5 mg Tabs tablet    Commonly known as: LIORESAL    Take 0.5 tablets (5 mg total) by mouth 3 (three) times daily as needed for muscle spasms.    lisinopril 10 MG tablet    Commonly known as: PRINIVIL,ZESTRIL    Take 1 tablet (10 mg total) by mouth daily.    metFORMIN 1000 MG tablet    Commonly known as: GLUCOPHAGE    Take 1,000 mg by mouth 2 (two) times daily with a meal.    simvastatin 20 MG tablet    Commonly known as: ZOCOR    Take 1 tablet  (20 mg total) by mouth daily at 6 PM.     No orders of the defined types were placed in this encounter.  There is no immunization history on file for this patient.  History   Substance Use Topics   .  Smoking status:  Current Every Day Smoker -- 0.25 packs/day     Types:  Cigarettes   .  Smokeless tobacco:  Never Used   .  Alcohol Use:  Yes      Comment: occassional   Review of Systems  DATA OBTAINED: from patient  GENERAL: Feels well no fevers, fatigue, appetite changes  SKIN: No itching, rash  HEENT: No complaint  RESPIRATORY: No cough, wheezing, SOB  CARDIAC: No chest pain, palpitations, lower extremity edema  GI: No abdominal pain, No N/V/D or constipation, No heartburn or reflux  GU: No dysuria, frequency or urgency, or incontinence  MUSCULOSKELETAL: No unrelieved bone/joint pain  NEUROLOGIC: No headache, dizziness or focal weakness  PSYCHIATRIC: No overt anxiety or sadness. Sleeps well.  Physical Exam  Temperature 97.8 pulse 74 respirations 18 blood pressure we'll 140/90 taken manually this appears to be relatively baseline GENERAL APPEARANCE: Alert, conversant. Appropriately groomed. No acute distress  SKIN: No diaphoresis rash  HEENT: Unremarkable oropharynx clear mucous membranes moist RESPIRATORY: Breathing is even, unlabored. Lung sounds are clear  CARDIOVASCULAR: Heart RRR no murmurs, rubs or gallops. No peripheral edema  GASTROINTESTINAL: Abdomen is soft, non-tender, not distended w/ normal bowel sounds.    MUSCULOSKELETAL: He is able to stand unassisted and uses walker although he is somewhat weak-there is still some mild residual left-sided weakness- NEUROLOGIC: Cranial nerves 2-12 grossly intact; L side weakness as noted above  PSYCHIATRIC: Mood and affect appropriate to situation, no behavioral issues  Patient Active Problem List    Diagnosis  Date Noted   .  Coronary vasospasm, cocaine related  12/29/2013   .  Cocaine abuse  12/28/2013    .  Alcoholism /alcohol abuse  12/28/2013   .  Spinal cord contusion  12/27/2013   .  Altered mental status  12/26/2013   .  Weakness  12/26/2013   .  HYPERLIPIDEMIA  04/01/2007   .  DIABETES MELLITUS, TYPE II  03/28/2007   .  OBESITY NOS  03/28/2007   .  HYPERTENSION  03/28/2007   CBC    Component  Value  Date/Time    WBC  6.7  12/30/2013 0336    RBC  4.77  12/30/2013 0336    HGB  13.2  12/30/2013 0336    HCT  40.0  12/30/2013 0336    PLT  210  12/30/2013 0336    MCV  83.9  12/30/2013 0336    LYMPHSABS  2.9  12/26/2013 0216    MONOABS  0.5  12/26/2013 0216    EOSABS  0.2  12/26/2013 0216    BASOSABS  0.1  12/26/2013 0216   CMP    Component  Value  Date/Time    NA  137  01/02/2014 1155    K  4.9  01/02/2014 1155    CL  100  01/02/2014 1155    CO2  23  01/02/2014 1155    GLUCOSE  205*  01/02/2014 1155    BUN  17  01/02/2014 1155    CREATININE  0.86  01/02/2014 1155    CALCIUM  9.9  01/02/2014 1155    PROT  8.0  04/27/2012 1531    ALBUMIN  4.3  04/27/2012 1531    AST  15  04/27/2012 1531    ALT  16  04/27/2012 1531    ALKPHOS  64  04/27/2012 1531    BILITOT  0.4  04/27/2012 1531    GFRNONAA  >90  01/02/2014 1155    GFRAA  >90  01/02/2014 1155   Assessment and Plan  HYPERTENSION  Controlled on lisinopril--I see some systolics in the 175Z with some frequency however since he is about to be discharged we'll defer to primary care provider followup  Coronary vasospasm, cocaine related  Pt has not has access to cocaine as far as I know  DIABETES MELLITUS, TYPE II  Appears to be doing quite well on the Glucophage Spinal cord contusion  Still using wheel chair a lot but is able to ambulate with a walker-will need a rolling walker upon discharge --does use  muscle relaxer at time Alcoholism /alcohol abuse  None recently as far as I know  HYPERLIPIDEMIA  Continues on zocor---.  Of note we'll obtain a CBC  and CMP-notify primary care provider of results   [phone] note greater than 30 minutes spent on  this discharge summary

## 2014-05-22 ENCOUNTER — Encounter: Payer: Self-pay | Admitting: Internal Medicine

## 2014-05-22 ENCOUNTER — Ambulatory Visit: Payer: Medicaid Other | Attending: Internal Medicine | Admitting: Internal Medicine

## 2014-05-22 VITALS — BP 155/92 | HR 85 | Temp 98.3°F | Resp 16 | Wt 270.0 lb

## 2014-05-22 DIAGNOSIS — I10 Essential (primary) hypertension: Secondary | ICD-10-CM | POA: Diagnosis not present

## 2014-05-22 DIAGNOSIS — I201 Angina pectoris with documented spasm: Secondary | ICD-10-CM

## 2014-05-22 DIAGNOSIS — S14109A Unspecified injury at unspecified level of cervical spinal cord, initial encounter: Secondary | ICD-10-CM | POA: Insufficient documentation

## 2014-05-22 DIAGNOSIS — E785 Hyperlipidemia, unspecified: Secondary | ICD-10-CM | POA: Diagnosis not present

## 2014-05-22 DIAGNOSIS — I25111 Atherosclerotic heart disease of native coronary artery with angina pectoris with documented spasm: Secondary | ICD-10-CM | POA: Insufficient documentation

## 2014-05-22 DIAGNOSIS — F1410 Cocaine abuse, uncomplicated: Secondary | ICD-10-CM | POA: Diagnosis not present

## 2014-05-22 DIAGNOSIS — L84 Corns and callosities: Secondary | ICD-10-CM | POA: Insufficient documentation

## 2014-05-22 DIAGNOSIS — Z7982 Long term (current) use of aspirin: Secondary | ICD-10-CM | POA: Diagnosis not present

## 2014-05-22 DIAGNOSIS — Z23 Encounter for immunization: Secondary | ICD-10-CM | POA: Diagnosis not present

## 2014-05-22 DIAGNOSIS — E119 Type 2 diabetes mellitus without complications: Secondary | ICD-10-CM | POA: Insufficient documentation

## 2014-05-22 DIAGNOSIS — R531 Weakness: Secondary | ICD-10-CM

## 2014-05-22 DIAGNOSIS — S14109S Unspecified injury at unspecified level of cervical spinal cord, sequela: Secondary | ICD-10-CM

## 2014-05-22 DIAGNOSIS — E139 Other specified diabetes mellitus without complications: Secondary | ICD-10-CM

## 2014-05-22 LAB — GLUCOSE, POCT (MANUAL RESULT ENTRY): POC GLUCOSE: 195 mg/dL — AB (ref 70–99)

## 2014-05-22 LAB — POCT GLYCOSYLATED HEMOGLOBIN (HGB A1C): Hemoglobin A1C: 7.2

## 2014-05-22 MED ORDER — FREESTYLE SYSTEM KIT: 1.0000 | PACK | Status: DC | PRN

## 2014-05-22 NOTE — Progress Notes (Signed)
Patient Demographics  [name], is a 53 y.o. male  LGX:[id number]  CXK:[id number]  DOB - 10/30/1960  CC:  Chief Complaint  Patient presents with  . Establish Care       HPI: [name] is a 53 y.o. male here today to establish medical care.Patient has History of hypertension diabetes hyperlipidemia, polysubstance abuse, mild CAD coronary vasospasm, spinal cord contusion with left-sided weakness, in July he was in  nursing home after being hospitalized secondary to complications of substance abuse, EMR reviewed patient had cardiac cath done reported to have mild CAD , patient denies any chest pain or shortness of breath, denies any more illicit drug use, still smokes cigarettes, advised patient to quit smoking, does complain of dry skin as well  callus in his feet, as per patient he has seen ophthalmologist but has not seen a podiatrist. Patient has No headache, No chest pain, No abdominal pain - No Nausea, No new weakness tingling or numbness, No Cough - SOB.  No Known Allergies Past Medical History  Diagnosis Date  . Diabetes mellitus   . Hypertension    Current Outpatient Prescriptions on File Prior to Visit  Medication Sig Dispense Refill  . acetaminophen (TYLENOL) 325 MG tablet Take 2 tablets (650 mg total) by mouth every 4 (four) hours as needed for mild pain or fever.      Marland Kitchen aspirin EC 81 MG EC tablet Take 1 tablet (81 mg total) by mouth daily.      . baclofen (LIORESAL) 5 mg TABS tablet Take 0.5 tablets (5 mg total) by mouth 3 (three) times daily as needed for muscle spasms.  15 tablet  0  . lisinopril (PRINIVIL,ZESTRIL) 10 MG tablet Take 1 tablet (10 mg total) by mouth daily.      . metFORMIN (GLUCOPHAGE) 1000 MG tablet Take 1,000 mg by mouth 2 (two) times daily with a meal.      . simvastatin (ZOCOR) 20 MG tablet Take 1 tablet (20 mg total) by mouth daily at 6 PM.  30 tablet     No current facility-administered medications on file prior to visit.   Family  History  Problem Relation Age of Onset  . Hypertension Mother   . Diabetes Mother   . Cancer Mother     breast cancer   . Hypertension Father   . Heart disease Father   . Stroke Father   . Hypertension Sister    History   Social History  . Marital Status: Divorced    Spouse Name: N/A    Number of Children: N/A  . Years of Education: N/A   Occupational History  . Not on file.   Social History Main Topics  . Smoking status: Current Every Day Smoker -- 0.25 packs/day    Types: Cigarettes  . Smokeless tobacco: Never Used  . Alcohol Use: Yes     Comment: occassional  . Drug Use: Yes    Special: "Crack" cocaine     Comment: last use june 2015  . Sexual Activity: Not on file   Other Topics Concern  . Not on file   Social History Narrative  . No narrative on file    Review of Systems: Constitutional: Negative for fever, chills, diaphoresis, activity change, appetite change and fatigue. HENT: Negative for ear pain, nosebleeds, congestion, facial swelling, rhinorrhea, neck pain, neck stiffness and ear discharge.  Eyes: Negative for pain, discharge, redness, itching and visual disturbance. Respiratory: Negative for cough, choking, chest tightness, shortness  of breath, wheezing and stridor.  Cardiovascular: Negative for chest pain, palpitations and leg swelling. Gastrointestinal: Negative for abdominal distention. Genitourinary: Negative for dysuria, urgency, frequency, hematuria, flank pain, decreased urine volume, difficulty urinating and dyspareunia.  Musculoskeletal: Negative for back pain, joint swelling, arthralgia and gait problem. Neurological: Negative for dizziness, tremors, seizures, syncope, facial asymmetry, speech difficulty, weakness, light-headedness, numbness and headaches.  Hematological: Negative for adenopathy. Does not bruise/bleed easily. Psychiatric/Behavioral: Negative for hallucinations, behavioral problems, confusion, dysphoric mood, decreased  concentration and agitation.    Objective:   Filed Vitals:   05/22/14 0916  BP: 155/92  Pulse: 85  Temp: 98.3 F (36.8 C)  Resp: 16    Physical Exam: Constitutional: Patient appears well-developed and well-nourished. No distress. HENT: Normocephalic, atraumatic, External right and left ear normal. Oropharynx is clear and moist.  Eyes: Conjunctivae and EOM are normal. PERRLA, no scleral icterus. Neck: Normal ROM. Neck supple. No JVD. No tracheal deviation. No thyromegaly. CVS: RRR, S1/S2 +, no murmurs, no gallops, no carotid bruit.  Pulmonary: Effort and breath sounds normal, no stridor, rhonchi, wheezes, rales.  Abdominal: Soft. BS +, no distension, tenderness, rebound or guarding.  Musculoskeletal: left hand limited ROM in fingers hand slightly swollen.. Feet with callus  Skin: Skin is warm and dry. No rash noted. Not diaphoretic. No erythema. No pallor. Psychiatric: Normal mood and affect. Behavior, judgment, thought content normal.  Lab Results  Component Value Date   WBC 6.7 12/30/2013   HGB 13.2 12/30/2013   HCT 40.0 12/30/2013   MCV 83.9 12/30/2013   PLT 210 12/30/2013   Lab Results  Component Value Date   CREATININE 0.86 01/02/2014   BUN 17 01/02/2014   NA 137 01/02/2014   K 4.9 01/02/2014   CL 100 01/02/2014   CO2 23 01/02/2014    Lab Results  Component Value Date   HGBA1C 7.2 05/22/2014   Lipid Panel     Component Value Date/Time   CHOL 204* 12/28/2013 1340   TRIG 202* 12/28/2013 1340   HDL 56 12/28/2013 1340   CHOLHDL 3.6 12/28/2013 1340   VLDL 40 12/28/2013 1340   LDLCALC 108* 12/28/2013 1340       Assessment and plan:   1. Other specified diabetes mellitus without complications Results for orders placed in visit on 05/22/14  GLUCOSE, POCT (MANUAL RESULT ENTRY)      Result Value Ref Range   POC Glucose 195 (*) 70 - 99 mg/dl  POCT GLYCOSYLATED HEMOGLOBIN (HGB A1C)      Result Value Ref Range   Hemoglobin A1C 7.2     Diabetes is fairly well controlled his  hemoglobin A1c is 7.2%, I have advised patient for diabetes meal planning, will continue with current meds including metformin and Glucotrol, will recheck A1c in 3 months.  2. Encounter for immunization Flu shot given today   3. Weakness/tusion of cervical cord, sequela  - Ambulatory referral to Physical Therapy  4. Coronary vasospasm, cocaine related Currently on asa statin Ace inhibitor   6. Callus of foot  - Ambulatory referral to Podiatry  7. Essential hypertension  Advised patient for DASH diet will do fasting blood work   - COMPLETE METABOLIC PANEL WITH GFR; Future - Lipid panel; Future - TSH; Future - Vit D  25 hydroxy (rtn osteoporosis monitoring); Future  8. Hyperlipidemia Will check lipid panel       Health Maintenance Flu shot given today    Return in about 3 months (around 08/22/2014) for hypertension, diabetes, hyperipidemia.  Lorayne Marek, MD

## 2014-05-22 NOTE — Patient Instructions (Signed)
Diabetes Mellitus and Food It is important for you to manage your blood sugar (glucose) level. Your blood glucose level can be greatly affected by what you eat. Eating healthier foods in the appropriate amounts throughout the day at about the same time each day will help you control your blood glucose level. It can also help slow or prevent worsening of your diabetes mellitus. Healthy eating may even help you improve the level of your blood pressure and reach or maintain a healthy weight.  HOW CAN FOOD AFFECT ME? Carbohydrates Carbohydrates affect your blood glucose level more than any other type of food. Your dietitian will help you determine how many carbohydrates to eat at each meal and teach you how to count carbohydrates. Counting carbohydrates is important to keep your blood glucose at a healthy level, especially if you are using insulin or taking certain medicines for diabetes mellitus. Alcohol Alcohol can cause sudden decreases in blood glucose (hypoglycemia), especially if you use insulin or take certain medicines for diabetes mellitus. Hypoglycemia can be a life-threatening condition. Symptoms of hypoglycemia (sleepiness, dizziness, and disorientation) are similar to symptoms of having too much alcohol.  If your health care provider has given you approval to drink alcohol, do so in moderation and use the following guidelines:  Women should not have more than one drink per day, and men should not have more than two drinks per day. One drink is equal to:  12 oz of beer.  5 oz of wine.  1 oz of hard liquor.  Do not drink on an empty stomach.  Keep yourself hydrated. Have water, diet soda, or unsweetened iced tea.  Regular soda, juice, and other mixers might contain a lot of carbohydrates and should be counted. WHAT FOODS ARE NOT RECOMMENDED? As you make food choices, it is important to remember that all foods are not the same. Some foods have fewer nutrients per serving than other  foods, even though they might have the same number of calories or carbohydrates. It is difficult to get your body what it needs when you eat foods with fewer nutrients. Examples of foods that you should avoid that are high in calories and carbohydrates but low in nutrients include:  Trans fats (most processed foods list trans fats on the Nutrition Facts label).  Regular soda.  Juice.  Candy.  Sweets, such as cake, pie, doughnuts, and cookies.  Fried foods. WHAT FOODS CAN I EAT? Have nutrient-rich foods, which will nourish your body and keep you healthy. The food you should eat also will depend on several factors, including:  The calories you need.  The medicines you take.  Your weight.  Your blood glucose level.  Your blood pressure level.  Your cholesterol level. You also should eat a variety of foods, including:  Protein, such as meat, poultry, fish, tofu, nuts, and seeds (lean animal proteins are best).  Fruits.  Vegetables.  Dairy products, such as milk, cheese, and yogurt (low fat is best).  Breads, grains, pasta, cereal, rice, and beans.  Fats such as olive oil, trans fat-free margarine, canola oil, avocado, and olives. DOES EVERYONE WITH DIABETES MELLITUS HAVE THE SAME MEAL PLAN? Because every person with diabetes mellitus is different, there is not one meal plan that works for everyone. It is very important that you meet with a dietitian who will help you create a meal plan that is just right for you. Document Released: 04/06/2005 Document Revised: 07/15/2013 Document Reviewed: 06/06/2013 Exit[employer] Patient Information 2015 [employer]. This   information is not intended to replace advice given to you by your health care provider. Make sure you discuss any questions you have with your health care provider. DASH Eating Plan DASH stands for "Dietary Approaches to Stop Hypertension." The DASH eating plan is a healthy eating plan that has been shown to reduce high  blood pressure (hypertension). Additional health benefits may include reducing the risk of type 2 diabetes mellitus, heart disease, and stroke. The DASH eating plan may also help with weight loss. WHAT DO I NEED TO KNOW ABOUT THE DASH EATING PLAN? For the DASH eating plan, you will follow these general guidelines:  Choose foods with a percent daily value for sodium of less than 5% (as listed on the food label).  Use salt-free seasonings or herbs instead of table salt or sea salt.  Check with your health care provider or pharmacist before using salt substitutes.  Eat lower-sodium products, often labeled as "lower sodium" or "no salt added."  Eat fresh foods.  Eat more vegetables, fruits, and low-fat dairy products.  Choose whole grains. Look for the word "whole" as the first word in the ingredient list.  Choose fish and skinless chicken or turkey more often than red meat. Limit fish, poultry, and meat to 6 oz (170 g) each day.  Limit sweets, desserts, sugars, and sugary drinks.  Choose heart-healthy fats.  Limit cheese to 1 oz (28 g) per day.  Eat more home-cooked food and less restaurant, buffet, and fast food.  Limit fried foods.  Cook foods using methods other than frying.  Limit canned vegetables. If you do use them, rinse them well to decrease the sodium.  When eating at a restaurant, ask that your food be prepared with less salt, or no salt if possible. WHAT FOODS CAN I EAT? Seek help from a dietitian for individual calorie needs. Grains Whole grain or whole wheat bread. Brown rice. Whole grain or whole wheat pasta. Quinoa, bulgur, and whole grain cereals. Low-sodium cereals. Corn or whole wheat flour tortillas. Whole grain cornbread. Whole grain crackers. Low-sodium crackers. Vegetables Fresh or frozen vegetables (raw, steamed, roasted, or grilled). Low-sodium or reduced-sodium tomato and vegetable juices. Low-sodium or reduced-sodium tomato sauce and paste. Low-sodium  or reduced-sodium canned vegetables.  Fruits All fresh, canned (in natural juice), or frozen fruits. Meat and Other Protein Products Ground beef (85% or leaner), grass-fed beef, or beef trimmed of fat. Skinless chicken or turkey. Ground chicken or turkey. Pork trimmed of fat. All fish and seafood. Eggs. Dried beans, peas, or lentils. Unsalted nuts and seeds. Unsalted canned beans. Dairy Low-fat dairy products, such as skim or 1% milk, 2% or reduced-fat cheeses, low-fat ricotta or cottage cheese, or plain low-fat yogurt. Low-sodium or reduced-sodium cheeses. Fats and Oils Tub margarines without trans fats. Light or reduced-fat mayonnaise and salad dressings (reduced sodium). Avocado. Safflower, olive, or canola oils. Natural peanut or almond butter. Other Unsalted popcorn and pretzels. The items listed above may not be a complete list of recommended foods or beverages. Contact your dietitian for more options. WHAT FOODS ARE NOT RECOMMENDED? Grains White bread. White pasta. White rice. Refined cornbread. Bagels and croissants. Crackers that contain trans fat. Vegetables Creamed or fried vegetables. Vegetables in a cheese sauce. Regular canned vegetables. Regular canned tomato sauce and paste. Regular tomato and vegetable juices. Fruits Dried fruits. Canned fruit in light or heavy syrup. Fruit juice. Meat and Other Protein Products Fatty cuts of meat. Ribs, chicken wings, bacon, sausage, bologna, salami, chitterlings, fatback, hot   dogs, bratwurst, and packaged luncheon meats. Salted nuts and seeds. Canned beans with salt. Dairy Whole or 2% milk, cream, half-and-half, and cream cheese. Whole-fat or sweetened yogurt. Full-fat cheeses or blue cheese. Nondairy creamers and whipped toppings. Processed cheese, cheese spreads, or cheese curds. Condiments Onion and garlic salt, seasoned salt, table salt, and sea salt. Canned and packaged gravies. Worcestershire sauce. Tartar sauce. Barbecue sauce.  Teriyaki sauce. Soy sauce, including reduced sodium. Steak sauce. Fish sauce. Oyster sauce. Cocktail sauce. Horseradish. Ketchup and mustard. Meat flavorings and tenderizers. Bouillon cubes. Hot sauce. Tabasco sauce. Marinades. Taco seasonings. Relishes. Fats and Oils Butter, stick margarine, lard, shortening, ghee, and bacon fat. Coconut, palm kernel, or palm oils. Regular salad dressings. Other Pickles and olives. Salted popcorn and pretzels. The items listed above may not be a complete list of foods and beverages to avoid. Contact your dietitian for more information. WHERE CAN I FIND MORE INFORMATION? National Heart, Lung, and [employer] Institute: www.nhlbi.nih.gov/health/health-topics/topics/dash/ Document Released: 06/29/2011 Document Revised: 11/24/2013 Document Reviewed: 05/14/2013 ExitCare Patient Information 2015 [employer]. This information is not intended to replace advice given to you by your health care provider. Make sure you discuss any questions you have with your health care provider.  

## 2014-05-22 NOTE — Progress Notes (Signed)
Patient here to establish care Patient states fell back in June and now his left hand is swollen and minimal use

## 2014-06-01 ENCOUNTER — Encounter: Payer: Self-pay | Admitting: Podiatry

## 2014-06-01 ENCOUNTER — Ambulatory Visit (INDEPENDENT_AMBULATORY_CARE_PROVIDER_SITE_OTHER): Payer: Medicaid Other | Admitting: Podiatry

## 2014-06-01 VITALS — BP 175/93 | HR 77 | Resp 16 | Ht 75.0 in | Wt 280.0 lb

## 2014-06-01 DIAGNOSIS — M79676 Pain in unspecified toe(s): Secondary | ICD-10-CM

## 2014-06-01 DIAGNOSIS — B351 Tinea unguium: Secondary | ICD-10-CM

## 2014-06-01 NOTE — Progress Notes (Signed)
   Subjective:    Patient ID: [name], male    DOB: [birth year], 53 y.o.   MRN: [id number]  HPI Comments: N debridement L left 1st toe plantar and 10 toenails D and O long-term C painful callous, and tender 10 toenails A diabetes, difficult to trim T none     Review of Systems  HENT: Positive for hearing loss.   Cardiovascular: Positive for leg swelling.       Calf pain with walking.  Musculoskeletal: Positive for myalgias and gait problem.  Skin: Positive for rash.       Keloids.  Neurological: Positive for weakness and numbness.  All other systems reviewed and are negative.      Objective:   Physical Exam  Orientated 3  Vascular: DP pulses 2/4 bilaterally PT pulses 2/4 bilaterally Capillary reflex immediate bilaterally  Neurological: Sensation to 10 g monofilament wire intact 44/5 bilaterally Ankle reflex equal reactive bilaterally Vibratory sensation intact bilaterally  Dermatological The toenails are elongated, brittle, discolored, hypertrophic 6-10 right foot has dry scaling areas over the first MPJ and lateral right foot without any erythema , edema or warmth in these areas Reactive hypertrophy skin dorsum left hallux (may be associated with hallux limitus) Plantar callus left hallux  Musculoskeletal: Patient has slow gait requiring cane Manual motor testing dorsi flexion plantar flexion bilaterally 5/5 bilaterally Left HAV  Functional hallux limitus left Hammertoe second bilaterally Gait disturbance            Assessment & Plan:   Assessment: Protective sensation intact bilaterally Satisfactory vascular status Gait disturbance Dermatitis right Symptomatic onychomycoses 6-10 Plantar keratoses left  Plan: Patient advised to consult dermatologist evaluate skin lesions on dorsum of right foot Nails 10 and keratoses 1 debrided without any bleeding  Reappoint at three-month intervals

## 2014-06-01 NOTE — Patient Instructions (Signed)
Contact dermatologist to evaluate skin rashes on right and left feet  Diabetes and Foot Care Diabetes may cause you to have problems because of poor blood supply (circulation) to your feet and legs. This may cause the skin on your feet to become thinner, break easier, and heal more slowly. Your skin may become dry, and the skin may peel and crack. You may also have nerve damage in your legs and feet causing decreased feeling in them. You may not notice minor injuries to your feet that could lead to infections or more serious problems. Taking care of your feet is one of the most important things you can do for yourself.  HOME CARE INSTRUCTIONS  Wear shoes at all times, even in the house. Do not go barefoot. Bare feet are easily injured.  Check your feet daily for blisters, cuts, and redness. If you cannot see the bottom of your feet, use a mirror or ask someone for help.  Wash your feet with warm water (do not use hot water) and mild soap. Then pat your feet and the areas between your toes until they are completely dry. Do not soak your feet as this can dry your skin.  Apply a moisturizing lotion or petroleum jelly (that does not contain alcohol and is unscented) to the skin on your feet and to dry, brittle toenails. Do not apply lotion between your toes.  Trim your toenails straight across. Do not dig under them or around the cuticle. File the edges of your nails with an emery board or nail file.  Do not cut corns or calluses or try to remove them with medicine.  Wear clean socks or stockings every day. Make sure they are not too tight. Do not wear knee-high stockings since they may decrease blood flow to your legs.  Wear shoes that fit properly and have enough cushioning. To break in new shoes, wear them for just a few hours a day. This prevents you from injuring your feet. Always look in your shoes before you put them on to be sure there are no objects inside.  Do not cross your legs. This  may decrease the blood flow to your feet.  If you find a minor scrape, cut, or break in the skin on your feet, keep it and the skin around it clean and dry. These areas may be cleansed with mild soap and water. Do not cleanse the area with peroxide, alcohol, or iodine.  When you remove an adhesive bandage, be sure not to damage the skin around it.  If you have a wound, look at it several times a day to make sure it is healing.  Do not use heating pads or hot water bottles. They may burn your skin. If you have lost feeling in your feet or legs, you may not know it is happening until it is too late.  Make sure your health care provider performs a complete foot exam at least annually or more often if you have foot problems. Report any cuts, sores, or bruises to your health care provider immediately. SEEK MEDICAL CARE IF:   You have an injury that is not healing.  You have cuts or breaks in the skin.  You have an ingrown nail.  You notice redness on your legs or feet.  You feel burning or tingling in your legs or feet.  You have pain or cramps in your legs and feet.  Your legs or feet are numb.  Your feet always feel  cold. SEEK IMMEDIATE MEDICAL CARE IF:   There is increasing redness, swelling, or pain in or around a wound.  There is a red line that goes up your leg.  Pus is coming from a wound.  You develop a fever or as directed by your health care provider.  You notice a bad smell coming from an ulcer or wound. Document Released: 07/07/2000 Document Revised: 03/12/2013 Document Reviewed: 12/17/2012 [facility] Patient Information 2015 [place], [place]. This information is not intended to replace advice given to you by your health care provider. Make sure you discuss any questions you have with your health care provider.

## 2014-06-02 ENCOUNTER — Encounter: Payer: Self-pay | Admitting: Podiatry

## 2014-06-10 ENCOUNTER — Other Ambulatory Visit: Payer: Self-pay

## 2014-06-10 MED ORDER — LISINOPRIL 10 MG PO TABS: 10.0000 mg | ORAL_TABLET | Freq: Every day | ORAL | Status: DC

## 2014-06-10 MED ORDER — GABAPENTIN 300 MG PO CAPS: 300.0000 mg | ORAL_CAPSULE | Freq: Three times a day (TID) | ORAL | Status: DC

## 2014-06-10 MED ORDER — METFORMIN HCL 1000 MG PO TABS: 1000.0000 mg | ORAL_TABLET | Freq: Two times a day (BID) | ORAL | Status: DC

## 2014-06-10 MED ORDER — SIMVASTATIN 20 MG PO TABS: 20.0000 mg | ORAL_TABLET | Freq: Every day | ORAL | Status: DC

## 2014-06-22 ENCOUNTER — Telehealth: Payer: Self-pay | Admitting: Rehabilitative and Restorative Service Providers"

## 2014-06-22 ENCOUNTER — Ambulatory Visit: Payer: Medicaid Other | Attending: Internal Medicine | Admitting: Rehabilitative and Restorative Service Providers"

## 2014-06-22 ENCOUNTER — Encounter: Payer: Self-pay | Admitting: Rehabilitative and Restorative Service Providers"

## 2014-06-22 DIAGNOSIS — R269 Unspecified abnormalities of gait and mobility: Secondary | ICD-10-CM | POA: Diagnosis not present

## 2014-06-22 DIAGNOSIS — M6281 Muscle weakness (generalized): Secondary | ICD-10-CM | POA: Insufficient documentation

## 2014-06-22 DIAGNOSIS — Z5189 Encounter for other specified aftercare: Secondary | ICD-10-CM | POA: Insufficient documentation

## 2014-06-22 NOTE — Telephone Encounter (Signed)
Mr. Laws would benefit from O.T. Referral due to L UE/hand edema and decreased functional use. Please input EPIC referral for OT if agree.  PT awaiting m-caid authorization (requested 12 visits) for mobility, balance and gait training. Thank you, Rudell Cobb, PT

## 2014-06-22 NOTE — Therapy (Signed)
Physical Therapy Evaluation  Patient Details  Name: [name] MRN: [id number] Date of Birth: 01-06-1961   Encounter Date: 06/22/2014      PT End of Session - 06/22/14 1043    Visit Number 1   Number of Visits 12   Date for PT Re-Evaluation 08/21/14   Authorization Type m-caid requesting authorization   Authorization Time Period to be determined   Authorization - Visit Number 1   Authorization - Number of Visits --  to be determined   PT Start Time 0842   PT Stop Time 0935   PT Time Calculation (min) 53 min   Equipment Utilized During Treatment Gait belt   Activity Tolerance Patient tolerated treatment well   Behavior During Therapy WFL for tasks assessed/performed      Past Medical History  Diagnosis Date  . Diabetes mellitus   . Hypertension     Past Surgical History  Procedure Laterality Date  . Cardiac catheterization  12/27/2013  . Left arm skin graft      There were no vitals taken for this visit.  Visit Diagnosis:  Abnormality of gait - Plan: PT plan of care cert/re-cert  Generalized muscle weakness - Plan: PT plan of care cert/re-cert      Subjective Assessment - 06/22/14 0853    Symptoms The patient sustained a fall on 12/26/2013 resulting in spinal cord contusion of cervical spine with resulting L sided weakness.  The patient c/o difficulty with negotiating steps, swelling in the L UE, difficulty showering, dressing, and completing ADLs due to L UE weakness.  The pateint is unable to tie his shoes and brush/comb hair.  The patient is R handed.    He also reports he has to concentrate on his balance and is unsteady.   Pertinent History The patient was admitted to hospital 12/26/2013 with  (+) history of substance abuse, STEMI,    Currently in Pain? Yes   Pain Score 8    Pain Location Hand   Pain Orientation Left   Pain Descriptors / Indicators Numbness;Discomfort;Shooting   Pain Type Chronic pain   Pain Radiating Towards pain can shoot up from hand  towards left shoulder   Pain Onset More than a month ago   Aggravating Factors  Trying to use the hand   Pain Relieving Factors Sleeping or relaxing   Effect of Pain on Daily Activities Decreased functional use of the left hand.   Multiple Pain Sites Yes   Pain Score 7   Pain Type Chronic pain   Pain Location Leg   Pain Orientation Left   Pain Radiating Towards Shoots up L leg from foot   Pain Descriptors / Indicators Shooting   Pain Frequency Intermittent   Pain Onset Sudden          [facility] PT Assessment - 06/22/14 0001    Precautions   Precautions --  no driving   Required Braces or Orthoses --  has left hand splint, not wearing today   Balance Screen   Has the patient fallen in the past 6 months No   How many times? --  lose balance, but no falls   Has the patient had a decrease in activity level because of a fear of falling?  Yes   Is the patient reluctant to leave their home because of a fear of falling?  Yes   Home Environment   Living Enviornment Shelter/Homeless   Additional Comments --  Lives @ [facility], one level entrance; shares  space   Prior Function   Level of Independence Independent with basic ADLs  requires increased time due to decreased L hand use   Vocation Unemployed   Sensation   Light Touch --  diminished light touch left side   Hot/Cold Appears Intact  per report   Coordination   Heel Shin Test --  abnormal Left due to weakness and decreased coordination   AROM   Overall AROM  Due to pain;Deficits  Patient with shoulder flexion to 90 degrees with pain   Overall AROM Comments patient has decreased tolerance to all A/ROM in LUE, especially with supination/pronation, shoulder flexion, shoulder abduction, and wrist isolated movements.  L LE movements hindered by weakness requiring increased time to perform tasks.   Strength   Overall Strength --  L side weakness UE/LE with decreased isolated control   Overall Strength Comments L LE is 3/5  for hip flexion, knee flexion/extension, ankle dorsiflexion.  R LE is 4/5 gross MMT. R UE is 4/5 shoulder flexion and L UE is <3/5 as patient A/ROM not full against gravity.   Transfers   Transfers Sit to Stand   Sit to Stand 6: Modified independent (Device/Increase time)   Ambulation/Gait   Ambulation/Gait Yes   Ambulation/Gait Assistance 6: Modified independent (Device/Increase time)   Ambulation Distance (Feet) 100 Feet   Assistive device Small based quad cane   Gait Pattern Step-to pattern   Gait velocity 1.66 ft/sec   Stairs Yes   Stairs Assistance 4: Min assist   Stairs Assistance Details (indicate cue type and reason) --  pt ascends with weak leg due to using R UE   Stair Management Technique Step to pattern;One rail Right   Number of Stairs 4   Balance   Balance Assessed Yes   Standardized Balance Assessment   Standardized Balance Assessment Berg Balance Test;Timed Up and Go Test   Berg Balance Test   Sit to Stand Able to stand  independently using hands   Standing Unsupported Able to stand safely 2 minutes   Sitting with Back Unsupported but Feet Supported on Floor or Stool Able to sit safely and securely 2 minutes   Stand to Sit Controls descent by using hands   Transfers Able to transfer safely, definite need of hands   Standing Unsupported with Eyes Closed Able to stand 10 seconds with supervision   Standing Ubsupported with Feet Together Able to place feet together independently and stand 1 minute safely   From Standing, Reach Forward with Outstretched Arm Can reach forward >12 cm safely (5")   From Standing Position, Pick up Object from Floor Able to pick up shoe, needs supervision   From Standing Position, Turn to Look Behind Over each Shoulder Looks behind one side only/other side shows less weight shift   Turn 360 Degrees Needs assistance while turning   Standing Unsupported, Alternately Place Feet on Step/Stool Needs assistance to keep from falling or unable to try    Standing Unsupported, One Foot in Front Able to take small step independently and hold 30 seconds   Standing on One Leg Tries to lift leg/unable to hold 3 seconds but remains standing independently   Total Score 36   Timed Up and Go Test   TUG Normal TUG   Normal TUG (seconds) --  34.30 seconds without a device    TUG Comments --  Pt at high risk for falls per Merrilee Jansky and TUG scores  PT Education - 06/22/14 1043    Education provided Yes   Education Details Correct set-up and use of small based quad cane   Person(s) Educated Patient   Methods Demonstration   Comprehension Verbalized understanding          PT Short Term Goals - 06/22/14 1052    PT SHORT TERM GOAL #1   Title The patient will return demo HEP for written cues.   Baseline The patient is not currently participating in regular HEP. (goal due 07/22/2014)   Time 4  07/22/2014   Period Weeks   Status New   PT SHORT TERM GOAL #2   Title The patient will improve Berg score to 42/56 to demo decreasing risk for falls.   Baseline Berg=36/56    Time 4   Period Weeks   Status New   PT SHORT TERM GOAL #3   Title The patient will decrease TUG score to < or equal to 28 seconds to demo improving mobility.   Baseline TUG score=34.40 seconds without a device   Time 4   Period Weeks   Status New   PT SHORT TERM GOAL #4   Title The patient will increase gait speed to > or equal to 1.8 ft/sec to demo decreased risk for falls.   Baseline Gait speed=1.66 ft/sec indicating high fall risk.   Time 4   Period Weeks   Status New          PT Long Term Goals - 06/22/14 1055    PT LONG TERM GOAL #1   Title The patient will improve Berg score to > or equal to 45/56 to demo decreased risk for falls.   Baseline Berg=36/56.   Time 6  due 08/22/2014   Period Weeks   Status New   PT LONG TERM GOAL #2   Title The patient will decrease TUG score to < or equal to 24 seconds.   Baseline TUG=34.40 seconds without a  device.   Time 6  due 08/22/2014   Period Weeks   Status New   PT LONG TERM GOAL #3   Title The patient will improve gait speed to > or equal to 2.4 ft/sec to demo improving community mobility.   Baseline Gait speed=1.66 ft/sec   Time 6  due 08/22/2014   Period Weeks   Status New   PT LONG TERM GOAL #4   Title The patient will negotiate 4 steps with one handrail with reciprocal pattern modified indep.   Baseline Pt requires min A for stair negotiation.   Time 6  due 08/22/2014   Period Weeks   Status New          Plan - 06/22/14 1043    Clinical Impression Statement 53 y.o. male who presented to [facility] 12/26/2013-01/02/2014 with complaints of whole body numbness and weakness after a fall w/ PMHx significant for h/o cocaine abuse, HTN, tobacco abuse, DM .    The patient underwent rehab at [facility] and was d/c on 02/19/2014.  He is currently residing in [place] and is referred today for P.T. s/p spinal cord compression from fall with generalized muscle weakness and abnormality of gait.  He will also benefit from referral for O.T. services due to L hand edema and decreased use of UE for all ADLs.    Pt will benefit from skilled therapeutic intervention in order to improve on the following deficits Abnormal gait;Decreased activity tolerance;Decreased balance;Decreased mobility;Decreased endurance;Decreased coordination;Decreased range of motion;Difficulty walking;Impaired  sensation   Rehab Potential Good   PT Frequency 2x / week   PT Duration 6 weeks   PT Treatment/Interventions Therapeutic activities;Patient/family education;Therapeutic exercise;Gait training;Balance training;Neuromuscular re-education;Manual techniques;Functional mobility training   PT Next Visit Plan Instruct patient in HEP for: LE LE strengthening, HEP balance near support surface, flexibility L LE, posture and general endurace.  Check on OT referral.   PT Home Exercise Plan Plan to add HEP at first  treatment session   Recommended Other Services OT recommended for L UE functional use.   Consulted and Agree with Plan of Care Patient        Problem List Patient Active Problem List   Diagnosis Date Noted  . Callus of foot 05/22/2014  . Contusion of cervical cord 05/22/2014  . Other specified diabetes mellitus without complications 38/46/6599  . Essential hypertension 05/22/2014  . Hyperlipidemia 05/22/2014  . Coronary vasospasm, cocaine related 12/29/2013  . Cocaine abuse 12/28/2013  . Alcoholism /alcohol abuse 12/28/2013  . Spinal cord contusion 12/27/2013  . Altered mental status 12/26/2013  . Weakness 12/26/2013  . HYPERLIPIDEMIA 04/01/2007  . DIABETES MELLITUS, TYPE II 03/28/2007  . OBESITY NOS 03/28/2007  . HYPERTENSION 03/28/2007     Thank you for the referral of this patient.                                      Rudell Cobb, PT, MPT 06/22/2014 2:26 PM [facility] Phone: [phone] Fax: [phone]    [place] 06/22/2014, 2:23 PM

## 2014-06-28 ENCOUNTER — Other Ambulatory Visit: Payer: Self-pay

## 2014-06-28 ENCOUNTER — Emergency Department (HOSPITAL_COMMUNITY)
Admission: EM | Admit: 2014-06-28 | Discharge: 2014-06-28 | Disposition: A | Discharge status: Home / Self Care | Payer: Medicaid Other | Attending: Emergency Medicine | Admitting: Emergency Medicine

## 2014-06-28 ENCOUNTER — Encounter (HOSPITAL_COMMUNITY): Payer: Self-pay

## 2014-06-28 DIAGNOSIS — Z7952 Long term (current) use of systemic steroids: Secondary | ICD-10-CM | POA: Insufficient documentation

## 2014-06-28 DIAGNOSIS — Z7982 Long term (current) use of aspirin: Secondary | ICD-10-CM | POA: Diagnosis not present

## 2014-06-28 DIAGNOSIS — I10 Essential (primary) hypertension: Secondary | ICD-10-CM | POA: Diagnosis not present

## 2014-06-28 DIAGNOSIS — R600 Localized edema: Secondary | ICD-10-CM | POA: Diagnosis not present

## 2014-06-28 DIAGNOSIS — R531 Weakness: Secondary | ICD-10-CM | POA: Insufficient documentation

## 2014-06-28 DIAGNOSIS — Z79899 Other long term (current) drug therapy: Secondary | ICD-10-CM | POA: Insufficient documentation

## 2014-06-28 DIAGNOSIS — E119 Type 2 diabetes mellitus without complications: Secondary | ICD-10-CM | POA: Insufficient documentation

## 2014-06-28 DIAGNOSIS — R609 Edema, unspecified: Secondary | ICD-10-CM

## 2014-06-28 DIAGNOSIS — R2232 Localized swelling, mass and lump, left upper limb: Secondary | ICD-10-CM | POA: Diagnosis present

## 2014-06-28 DIAGNOSIS — Z720 Tobacco use: Secondary | ICD-10-CM | POA: Insufficient documentation

## 2014-06-28 LAB — CBC WITH DIFFERENTIAL/PLATELET
Basophils Absolute: 0 10*3/uL (ref 0.0–0.1)
Basophils Relative: 0 % (ref 0–1)
EOS ABS: 0.3 10*3/uL (ref 0.0–0.7)
Eosinophils Relative: 3 % (ref 0–5)
HCT: 39.2 % (ref 39.0–52.0)
HEMOGLOBIN: 13.5 g/dL (ref 13.0–17.0)
Lymphocytes Relative: 20 % (ref 12–46)
Lymphs Abs: 1.9 10*3/uL (ref 0.7–4.0)
MCH: 28.6 pg (ref 26.0–34.0)
MCHC: 34.4 g/dL (ref 30.0–36.0)
MCV: 83.1 fL (ref 78.0–100.0)
MONOS PCT: 9 % (ref 3–12)
Monocytes Absolute: 0.9 10*3/uL (ref 0.1–1.0)
Neutro Abs: 6.6 10*3/uL (ref 1.7–7.7)
Neutrophils Relative %: 68 % (ref 43–77)
Platelets: 241 10*3/uL (ref 150–400)
RBC: 4.72 MIL/uL (ref 4.22–5.81)
RDW: 13 % (ref 11.5–15.5)
WBC: 9.7 10*3/uL (ref 4.0–10.5)

## 2014-06-28 LAB — COMPREHENSIVE METABOLIC PANEL
ALT: 10 U/L (ref 0–53)
ANION GAP: 16 — AB (ref 5–15)
AST: 14 U/L (ref 0–37)
Albumin: 3.9 g/dL (ref 3.5–5.2)
Alkaline Phosphatase: 56 U/L (ref 39–117)
BUN: 14 mg/dL (ref 6–23)
CO2: 22 mEq/L (ref 19–32)
Calcium: 9.9 mg/dL (ref 8.4–10.5)
Chloride: 97 mEq/L (ref 96–112)
Creatinine, Ser: 0.92 mg/dL (ref 0.50–1.35)
GFR calc Af Amer: >90 mL/min (ref >90)
GFR calc non Af Amer: >90 mL/min (ref >90)
Glucose, Bld: 149 mg/dL — ABNORMAL HIGH (ref 70–99)
Potassium: 4.5 mEq/L (ref 3.7–5.3)
Sodium: 135 mEq/L — ABNORMAL LOW (ref 137–147)
Total Bilirubin: 0.4 mg/dL (ref 0.3–1.2)
Total Protein: 7.5 g/dL (ref 6.0–8.3)

## 2014-06-28 LAB — TROPONIN I: Troponin I: <0.3 ng/mL (ref <0.30)

## 2014-06-28 NOTE — ED Notes (Signed)
PER EMS: pt from home, reports swelling and pain in his left arm and leg since June. Pain worsening so he decided to come in today to be evaluated.

## 2014-06-28 NOTE — ED Notes (Signed)
Pt admits to smoking "about $20 worth of crack" and drank "a 40 oz."

## 2014-06-28 NOTE — Discharge Instructions (Signed)
Peripheral Edema °You have swelling in your legs (peripheral edema). This swelling is due to excess accumulation of salt and water in your body. Edema may be a sign of heart, kidney or liver disease, or a side effect of a medication. It may also be due to problems in the leg veins. Elevating your legs and using special support stockings may be very helpful, if the cause of the swelling is due to poor venous circulation. Avoid long periods of standing, whatever the cause. °Treatment of edema depends on identifying the cause. Chips, pretzels, pickles and other salty foods should be avoided. Restricting salt in your diet is almost always needed. Water pills (diuretics) are often used to remove the excess salt and water from your body via urine. These medicines prevent the kidney from reabsorbing sodium. This increases urine flow. °Diuretic treatment may also result in lowering of potassium levels in your body. Potassium supplements may be needed if you have to use diuretics daily. Daily weights can help you keep track of your progress in clearing your edema. You should call your caregiver for follow up care as recommended. °SEEK IMMEDIATE MEDICAL CARE IF:  °· You have increased swelling, pain, redness, or heat in your legs. °· You develop shortness of breath, especially when lying down. °· You develop chest or abdominal pain, weakness, or fainting. °· You have a fever. °Document Released: 08/17/2004 Document Revised: 10/02/2011 Document Reviewed: 07/28/2009 °ExitCare® Patient Information ©2015 [employer]. This information is not intended to replace advice given to you by your health care provider. Make sure you discuss any questions you have with your health care provider. ° °

## 2014-06-28 NOTE — ED Notes (Signed)
Dr. Christy Gentles at at bedside.

## 2014-06-28 NOTE — ED Provider Notes (Signed)
CSN: [id number]     Arrival date & time 06/28/14  0317 History   First MD Initiated Contact with Patient 06/28/14 0340     Chief Complaint  Patient presents with  . Arm Swelling      The history is provided by the patient.  Patient presents for left LE swelling for 6 months.  He reports that has been constant for months but appeared to have worsened in the past 1-2 days.  Nothing improves the swelling.  He denies recent trauma to the leg.  He denies CP/SOB.  Pt also reports swelling to left UE but it is not worsened.  He also reports generalized weakness for months as well.    Past Medical History  Diagnosis Date  . Diabetes mellitus   . Hypertension    Past Surgical History  Procedure Laterality Date  . Cardiac catheterization  12/27/2013  . Left arm skin graft     Family History  Problem Relation Age of Onset  . Hypertension Mother   . Diabetes Mother   . Cancer Mother     breast cancer   . Hypertension Father   . Heart disease Father   . Stroke Father   . Hypertension Sister    History  Substance Use Topics  . Smoking status: Current Every Day Smoker -- 0.25 packs/day    Types: Cigarettes  . Smokeless tobacco: Never Used  . Alcohol Use: Yes     Comment: occassional    Review of Systems  Constitutional: Positive for fatigue. Negative for fever.  Respiratory: Negative for shortness of breath.   Cardiovascular: Positive for leg swelling. Negative for chest pain.  Gastrointestinal: Negative for vomiting and abdominal pain.  Genitourinary: Negative for dysuria.  Neurological: Positive for weakness.  All other systems reviewed and are negative.     Allergies  Review of patient's allergies indicates no known allergies.  Home Medications   Prior to Admission medications   Medication Sig Start Date End Date Taking? Authorizing Provider  acetaminophen (TYLENOL) 325 MG tablet Take 2 tablets (650 mg total) by mouth every 4 (four) hours as needed for mild pain  or fever. 01/02/14  Yes Delfina Redwood, MD  aspirin EC 81 MG EC tablet Take 1 tablet (81 mg total) by mouth daily. 01/02/14  Yes Delfina Redwood, MD  baclofen (LIORESAL) 5 mg TABS tablet Take 0.5 tablets (5 mg total) by mouth 3 (three) times daily as needed for muscle spasms. 01/02/14  Yes Delfina Redwood, MD  gabapentin (NEURONTIN) 300 MG capsule Take 1 capsule (300 mg total) by mouth 3 (three) times daily. 06/10/14  Yes Deepak Advani, MD  glipiZIDE (GLUCOTROL) 10 MG tablet Take 10 mg by mouth daily before breakfast.   Yes Historical Provider, MD  hydrocortisone 2.5 % cream Apply topically 2 (two) times daily.   Yes Historical Provider, MD  lisinopril (PRINIVIL,ZESTRIL) 10 MG tablet Take 1 tablet (10 mg total) by mouth daily. 06/10/14  Yes Lorayne Marek, MD  metFORMIN (GLUCOPHAGE) 1000 MG tablet Take 1 tablet (1,000 mg total) by mouth 2 (two) times daily with a meal. 06/10/14  Yes Deepak Advani, MD  simvastatin (ZOCOR) 20 MG tablet Take 1 tablet (20 mg total) by mouth daily at 6 PM. 06/10/14  Yes Deepak Advani, MD  glucose monitoring kit (FREESTYLE) monitoring kit 1 each by Does not apply route as needed for other. Dispense any model that is covered- dispense testing supplies for Q AC/ HS accuchecks- 1 month supply with  one refil. 05/22/14   Lorayne Marek, MD   BP 144/71 mmHg  Pulse 99  Temp(Src) 98.5 F (36.9 C) (Oral)  Resp 16  SpO2 99% Physical Exam CONSTITUTIONAL: Well developed/well nourished HEAD: Normocephalic/atraumatic EYES: EOMI ENMT: Mucous membranes moist NECK: supple no meningeal signs SPINE/BACK:entire spine nontender CV: S1/S2 noted, no murmurs/rubs/gallops noted LUNGS: Lungs are clear to auscultation bilaterally, no apparent distress ABDOMEN: soft, nontender, no rebound or guarding, bowel sounds noted throughout abdomen GU:no cva tenderness NEURO: Pt is awake/alert/appropriate, moves all extremitiesx4.  No facial droop. He has some weakness with flexion of left hip  (chronic)  EXTREMITIES: pulses normal/equal, full ROM, pitting edema to both lower extremities, left >right. No erythema noted to lower extremities.  He has mild edema to left hand (chronic).  There is no significant edema/erythema to upper extremities.  Distal pulses equal/intact SKIN: warm, color normal PSYCH: no abnormalities of mood noted, alert and oriented to situation  ED Course  Procedures  Labs Review Labs Reviewed  COMPREHENSIVE METABOLIC PANEL - Abnormal; Notable for the following:    Sodium 135 (*)    Glucose, Bld 149 (*)    Anion gap 16 (*)    All other components within normal limits  CBC WITH DIFFERENTIAL  TROPONIN I     Date: 06/28/2014 0400  Rate: 88  Rhythm: normal sinus rhythm  QRS Axis: left  Intervals: normal  ST/T Wave abnormalities: nonspecific ST changes  Conduction Disutrbances:none  Narrative Interpretation:   Old EKG Reviewed: unchanged    MDM  4:33 AM Pt is a very poor historian Initially unclear why he visited ER (reprorted swelling for months) then he elaborated that it was more difficult to put shoe on left foot due to swelling.  He does have increased edema to left LE After arrival he told nurse he smoked crack and drank malt liquor but denies any new CP/SOB  It appears he has previous history of spinal cord contusion with resultant left sided weakness.  Review of chart reveals he is likely at baseline with his motor function tonight.   6:09 AM Pt sleeping on repeat assessment He now reports that his weakness is unchanged He also reports the swelling in left LE is similar since the summer.   He has no calf tenderness He is in no distress without any other acute complaints I doubt this represents acute DVT Advised f/u with PCP BP 128/55 mmHg  Pulse 94  Temp(Src) 98.5 F (36.9 C) (Oral)  Resp 20  SpO2 99%   Final diagnoses:  Peripheral edema    Nursing notes including past medical history and social history reviewed and considered  in documentation Labs/vital reviewed myself and considered during evaluation Previous records reviewed and considered     Sharyon Cable, MD 06/28/14 [phone]

## 2014-07-02 ENCOUNTER — Encounter (HOSPITAL_COMMUNITY): Payer: Self-pay | Admitting: Cardiovascular Disease

## 2014-07-03 ENCOUNTER — Ambulatory Visit: Payer: Medicaid Other | Admitting: Rehabilitative and Restorative Service Providers"

## 2014-07-14 ENCOUNTER — Encounter: Payer: Self-pay | Admitting: Rehabilitative and Restorative Service Providers"

## 2014-07-14 NOTE — Therapy (Signed)
[facility] [address] [place], [place], [address] Phone: [phone]   Fax:  [phone]  Patient Details  Name: [name] MRN: [id number] Date of Birth: 1961-02-04  Encounter Date: 07/14/2014  PHYSICAL THERAPY DISCHARGE SUMMARY  Visits from Start of Care: eval only  Current functional level related to goals / functional outcomes: The patient did not return for PT due to financial limitations with no approval through m-caid.   Remaining deficits: See initial summary for patient status.   Education / Equipment: None.  Plan: Patient agrees to discharge.  Patient goals were not met. Patient is being discharged due to financial reasons.  ?????   Thank you for the referral of this patient.      Christain Mcraney 07/14/2014, 2:58 PM  [facility] [address] [place], [place], [address] Phone: [phone]   Fax:  [phone]

## 2014-07-21 ENCOUNTER — Telehealth: Payer: Self-pay

## 2014-07-21 NOTE — Telephone Encounter (Signed)
Patient is calling to request a referral for physical therapy to another facility that accepts medicaid. Please f/u with pt.

## 2014-07-28 NOTE — Telephone Encounter (Signed)
Pt requesting referral for physical therapy appointment to be made with a facility that accepts medicaid

## 2014-07-29 ENCOUNTER — Encounter: Payer: Self-pay | Admitting: Internal Medicine

## 2014-07-29 ENCOUNTER — Ambulatory Visit: Payer: Medicaid Other | Attending: Internal Medicine | Admitting: Internal Medicine

## 2014-07-29 VITALS — BP 166/97 | HR 96 | Temp 98.0°F | Resp 16 | Wt 269.0 lb

## 2014-07-29 DIAGNOSIS — F17210 Nicotine dependence, cigarettes, uncomplicated: Secondary | ICD-10-CM | POA: Diagnosis not present

## 2014-07-29 DIAGNOSIS — Z7982 Long term (current) use of aspirin: Secondary | ICD-10-CM | POA: Insufficient documentation

## 2014-07-29 DIAGNOSIS — IMO0001 Reserved for inherently not codable concepts without codable children: Secondary | ICD-10-CM

## 2014-07-29 DIAGNOSIS — E119 Type 2 diabetes mellitus without complications: Secondary | ICD-10-CM | POA: Insufficient documentation

## 2014-07-29 DIAGNOSIS — X58XXXD Exposure to other specified factors, subsequent encounter: Secondary | ICD-10-CM | POA: Diagnosis not present

## 2014-07-29 DIAGNOSIS — Z79899 Other long term (current) drug therapy: Secondary | ICD-10-CM | POA: Insufficient documentation

## 2014-07-29 DIAGNOSIS — E785 Hyperlipidemia, unspecified: Secondary | ICD-10-CM | POA: Diagnosis not present

## 2014-07-29 DIAGNOSIS — I10 Essential (primary) hypertension: Secondary | ICD-10-CM | POA: Diagnosis not present

## 2014-07-29 DIAGNOSIS — T148 Other injury of unspecified body region: Secondary | ICD-10-CM | POA: Diagnosis not present

## 2014-07-29 DIAGNOSIS — F17200 Nicotine dependence, unspecified, uncomplicated: Secondary | ICD-10-CM

## 2014-07-29 DIAGNOSIS — H538 Other visual disturbances: Secondary | ICD-10-CM | POA: Diagnosis not present

## 2014-07-29 DIAGNOSIS — E139 Other specified diabetes mellitus without complications: Secondary | ICD-10-CM

## 2014-07-29 DIAGNOSIS — S14109S Unspecified injury at unspecified level of cervical spinal cord, sequela: Secondary | ICD-10-CM

## 2014-07-29 DIAGNOSIS — Z720 Tobacco use: Secondary | ICD-10-CM

## 2014-07-29 LAB — POCT GLYCOSYLATED HEMOGLOBIN (HGB A1C): Hemoglobin A1C: 7.5

## 2014-07-29 LAB — GLUCOSE, POCT (MANUAL RESULT ENTRY): POC Glucose: 127 mg/dl — AB (ref 70–99)

## 2014-07-29 MED ORDER — SIMVASTATIN 20 MG PO TABS: 20.0000 mg | ORAL_TABLET | Freq: Every day | ORAL | Status: DC

## 2014-07-29 MED ORDER — GLIPIZIDE 10 MG PO TABS: 10.0000 mg | ORAL_TABLET | Freq: Two times a day (BID) | ORAL | Status: DC

## 2014-07-29 MED ORDER — GABAPENTIN 300 MG PO CAPS: 300.0000 mg | ORAL_CAPSULE | Freq: Three times a day (TID) | ORAL | Status: DC

## 2014-07-29 MED ORDER — LISINOPRIL 20 MG PO TABS: 10.0000 mg | ORAL_TABLET | Freq: Every day | ORAL | Status: DC

## 2014-07-29 NOTE — Progress Notes (Signed)
Patient here for follow up on his diabetes and hypertension Patient states his blood pressures have been elevated recently Complains of pain and weakness to the left side of his body

## 2014-07-29 NOTE — Progress Notes (Signed)
MRN: [id number] Name: [name]  Sex: male Age: 54 y.o. DOB: 01/16/1961  Allergies: Review of patient's allergies indicates no known allergies.  Chief Complaint  Patient presents with  . Follow-up    HPI: Patient is 54 y.o. 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  Past Medical History  Diagnosis Date  . Diabetes mellitus   . Hypertension     Past Surgical History  Procedure Laterality Date  . Cardiac catheterization  12/27/2013  . Left arm skin graft    . Left heart catheterization with coronary angiogram Bilateral 12/26/2013    Procedure: LEFT HEART CATHETERIZATION WITH CORONARY ANGIOGRAM;  Surgeon: Burnell Blanks, MD;  Location: [facility] CATH LAB;  Service: Cardiovascular;  Laterality: Bilateral;      Medication List       This list is accurate as of: 07/29/14 10:17 AM.  Always use your most recent med list.               acetaminophen 325 MG tablet  Commonly known as:  TYLENOL  Take 2 tablets (650 mg total) by mouth every 4 (four) hours as needed for mild pain or fever.     aspirin 81 MG EC tablet  Take 1 tablet (81 mg total) by mouth daily.     baclofen 5 mg Tabs tablet  Commonly known as:  LIORESAL  Take 0.5 tablets (5 mg total) by mouth 3 (three) times daily as needed for muscle spasms.     gabapentin 300 MG capsule  Commonly known as:  NEURONTIN  Take 1 capsule (300 mg total) by mouth 3 (three) times daily.     glipiZIDE 10 MG tablet  Commonly known as:  GLUCOTROL  Take 1 tablet  (10 mg total) by mouth 2 (two) times daily before a meal.     glucose monitoring kit monitoring kit  1 each by Does not apply route as needed for other. Dispense any model that is covered- dispense testing supplies for Q AC/ HS accuchecks- 1 month supply with one refil.     hydrocortisone 2.5 % cream  Apply topically 2 (two) times daily.     lisinopril 20 MG tablet  Commonly known as:  PRINIVIL,ZESTRIL  Take 0.5 tablets (10 mg total) by mouth daily.     metFORMIN 1000 MG tablet  Commonly known as:  GLUCOPHAGE  Take 1 tablet (1,000 mg total) by mouth 2 (two) times daily with a meal.     simvastatin 20 MG tablet  Commonly known as:  ZOCOR  Take 1 tablet (20 mg total) by mouth daily at 6 PM.        Meds ordered this encounter  Medications  . lisinopril (PRINIVIL,ZESTRIL) 20 MG tablet    Sig: Take 0.5 tablets (10 mg total) by mouth daily.    Dispense:  30 tablet    Refill:  3  . simvastatin (ZOCOR) 20 MG tablet    Sig: Take 1 tablet (20 mg total) by mouth daily at 6 PM.    Dispense:  30 tablet  Refill:  3  . gabapentin (NEURONTIN) 300 MG capsule    Sig: Take 1 capsule (300 mg total) by mouth 3 (three) times daily.    Dispense:  90 capsule    Refill:  3  . glipiZIDE (GLUCOTROL) 10 MG tablet    Sig: Take 1 tablet (10 mg total) by mouth 2 (two) times daily before a meal.    Dispense:  60 tablet    Refill:  3    Immunization History  Administered Date(s) Administered  . Influenza,inj,Quad PF,36+ Mos 05/22/2014    Family History  Problem Relation Age of Onset  . Hypertension Mother   . Diabetes Mother   . Cancer Mother     breast cancer   . Hypertension Father   . Heart disease Father   . Stroke Father   . Hypertension Sister     History  Substance Use Topics  . Smoking status: Current Every Day Smoker -- 0.25 packs/day    Types: Cigarettes  . Smokeless tobacco: Never Used  . Alcohol Use: Yes     Comment: occassional    Review of Systems   As noted in  HPI  Filed Vitals:   07/29/14 0953  BP: 166/97  Pulse: 96  Temp: 98 F (36.7 C)  Resp: 16    Physical Exam  Physical Exam  Eyes: EOM are normal. Pupils are equal, round, and reactive to light.  Cardiovascular: Normal rate and regular rhythm.   Pulmonary/Chest: Breath sounds normal. No respiratory distress. He has no wheezes. He has no rales.  Musculoskeletal:  left hand limited ROM in fingers hand slightly swollen    CBC    Component Value Date/Time   WBC 9.7 06/28/2014 0353   RBC 4.72 06/28/2014 0353   HGB 13.5 06/28/2014 0353   HCT 39.2 06/28/2014 0353   PLT 241 06/28/2014 0353   MCV 83.1 06/28/2014 0353   LYMPHSABS 1.9 06/28/2014 0353   MONOABS 0.9 06/28/2014 0353   EOSABS 0.3 06/28/2014 0353   BASOSABS 0.0 06/28/2014 0353    CMP     Component Value Date/Time   NA 135* 06/28/2014 0353   K 4.5 06/28/2014 0353   CL 97 06/28/2014 0353   CO2 22 06/28/2014 0353   GLUCOSE 149* 06/28/2014 0353   BUN 14 06/28/2014 0353   CREATININE 0.92 06/28/2014 0353   CALCIUM 9.9 06/28/2014 0353   PROT 7.5 06/28/2014 0353   ALBUMIN 3.9 06/28/2014 0353   AST 14 06/28/2014 0353   ALT 10 06/28/2014 0353   ALKPHOS 56 06/28/2014 0353   BILITOT 0.4 06/28/2014 0353   GFRNONAA >90 06/28/2014 0353   GFRAA >90 06/28/2014 0353    Lab Results  Component Value Date/Time   CHOL 204* 12/28/2013 01:40 PM    No components found for: HGA1C  Lab Results  Component Value Date/Time   AST 14 06/28/2014 03:53 AM    Assessment and Plan  Other specified diabetes mellitus without complications - Plan:  Results for orders placed or performed in visit on 07/29/14  Glucose (CBG)  Result Value Ref Range   POC Glucose 127.0 (A) 70 - 99 mg/dl  HgB A1c  Result Value Ref Range   Hemoglobin A1C 7.5    Diabetes is still uncontrolled , patient will continue with metformin thousand milligram twice a day, I have increased the dose of Glucotrol to 10 mg twice a day, advise patient for diabetes  meal planning, repeat A1c in 3 months , gabapentin (NEURONTIN) 300 MG capsule,  glipiZIDE (GLUCOTROL) 10 MG tablet  Essential hypertension - Plan:blood pressure is uncontrolled, have advised patient for DASH diet, increased the dose of lisinopril  lisinopril (PRINIVIL,ZESTRIL) 20 MG tablet, will repeat blood chemistry  Blurry vision - Plan: Ambulatory referral to Ophthalmology  Contusion of cervical cord, sequela  of currently following up with physical therapy.  Hyperlipidemia - Plan: currently patient is on simvastatin (ZOCOR) 20 MG tablet, we'll check fasting lipid panel.  Smoking Advised patient to quit smoking.  Health Maintenance  -Vaccinations:  pateint declines pneumovaxx   Return in about 3 months (around 10/28/2014) for diabetes, hypertension.  Lorayne Marek, MD

## 2014-07-31 NOTE — Telephone Encounter (Signed)
I spoke to him 07/30/14 and I told him that is not the facility is the procedure that don't accept medicaid

## 2014-08-13 ENCOUNTER — Emergency Department (HOSPITAL_COMMUNITY)
Admission: EM | Admit: 2014-08-13 | Discharge: 2014-08-13 | Disposition: A | Discharge status: Home / Self Care | Payer: Medicaid Other | Attending: Emergency Medicine | Admitting: Emergency Medicine

## 2014-08-13 ENCOUNTER — Encounter (HOSPITAL_COMMUNITY): Payer: Self-pay | Admitting: Emergency Medicine

## 2014-08-13 DIAGNOSIS — Z79899 Other long term (current) drug therapy: Secondary | ICD-10-CM | POA: Insufficient documentation

## 2014-08-13 DIAGNOSIS — Z7982 Long term (current) use of aspirin: Secondary | ICD-10-CM | POA: Diagnosis not present

## 2014-08-13 DIAGNOSIS — E119 Type 2 diabetes mellitus without complications: Secondary | ICD-10-CM | POA: Insufficient documentation

## 2014-08-13 DIAGNOSIS — Z7952 Long term (current) use of systemic steroids: Secondary | ICD-10-CM | POA: Diagnosis not present

## 2014-08-13 DIAGNOSIS — M79602 Pain in left arm: Secondary | ICD-10-CM | POA: Diagnosis not present

## 2014-08-13 DIAGNOSIS — Z720 Tobacco use: Secondary | ICD-10-CM | POA: Insufficient documentation

## 2014-08-13 DIAGNOSIS — I10 Essential (primary) hypertension: Secondary | ICD-10-CM | POA: Insufficient documentation

## 2014-08-13 DIAGNOSIS — G8929 Other chronic pain: Secondary | ICD-10-CM | POA: Diagnosis not present

## 2014-08-13 DIAGNOSIS — M79605 Pain in left leg: Secondary | ICD-10-CM | POA: Insufficient documentation

## 2014-08-13 HISTORY — DX: Pain in arm, unspecified: M79.603

## 2014-08-13 HISTORY — DX: Other chronic pain: G89.29

## 2014-08-13 HISTORY — DX: Unsteadiness on feet: R26.81

## 2014-08-13 HISTORY — DX: Cocaine abuse, uncomplicated: F14.10

## 2014-08-13 HISTORY — DX: Weakness: R53.1

## 2014-08-13 HISTORY — DX: Alcohol abuse, uncomplicated: F10.10

## 2014-08-13 HISTORY — DX: Localized edema: R60.0

## 2014-08-13 HISTORY — DX: Edema, unspecified: R60.9

## 2014-08-13 MED ORDER — TRAMADOL HCL 50 MG PO TABS
50.0000 mg | ORAL_TABLET | Freq: Once | ORAL | Status: AC
  Administered 2014-08-13: 50 mg via ORAL
  Filled 2014-08-13: qty 1

## 2014-08-13 MED ORDER — TRAMADOL HCL 50 MG PO TABS: 50.0000 mg | ORAL_TABLET | Freq: Two times a day (BID) | ORAL | Status: DC | PRN

## 2014-08-13 NOTE — ED Notes (Signed)
Pt verbalized understanding of discharge papers/prescriptions

## 2014-08-13 NOTE — ED Notes (Signed)
Per ems, pt has had left arm/leg pain x 7 months due to a fall. Pt states that pain has increased today. EMS did full neuro on scene, pt unable to grip with left hand which is a chronic problem due to fall. Both pupils both equal and reactive.

## 2014-08-13 NOTE — Discharge Instructions (Signed)
Your arm and leg pain is chronic and you will need to see your regular doctor for ongoing management, and possible referral to a pain management clinic. Use your home medications, and for severe pain use tramadol as directed. Do not drive while taking tramadol. Use heat to the affected areas to help with pain. See your doctor next week to set up ongoing evaluation of your pain. Return to the ER for changes or worsening in symptoms.   Musculoskeletal Pain Musculoskeletal pain is muscle and boney aches and pains. These pains can occur in any part of the body. Your caregiver may treat you without knowing the cause of the pain. They may treat you if blood or urine tests, X-rays, and other tests were normal.  CAUSES There is often not a definite cause or reason for these pains. These pains may be caused by a type of germ (virus). The discomfort may also come from overuse. Overuse includes working out too hard when your body is not fit. Boney aches also come from weather changes. Bone is sensitive to atmospheric pressure changes. HOME CARE INSTRUCTIONS   Ask when your test results will be ready. Make sure you get your test results.  Only take over-the-counter or prescription medicines for pain, discomfort, or fever as directed by your caregiver. If you were given medications for your condition, do not drive, operate machinery or power tools, or sign legal documents for 24 hours. Do not drink alcohol. Do not take sleeping pills or other medications that may interfere with treatment.  Continue all activities unless the activities cause more pain. When the pain lessens, slowly resume normal activities. Gradually increase the intensity and duration of the activities or exercise.  During periods of severe pain, bed rest may be helpful. Lay or sit in any position that is comfortable.  Putting ice on the injured area.  Put ice in a bag.  Place a towel between your skin and the bag.  Leave the ice on for 15  to 20 minutes, 3 to 4 times a day.  Follow up with your caregiver for continued problems and no reason can be found for the pain. If the pain becomes worse or does not go away, it may be necessary to repeat tests or do additional testing. Your caregiver may need to look further for a possible cause. SEEK IMMEDIATE MEDICAL CARE IF:  You have pain that is getting worse and is not relieved by medications.  You develop chest pain that is associated with shortness or breath, sweating, feeling sick to your stomach (nauseous), or throw up (vomit).  Your pain becomes localized to the abdomen.  You develop any new symptoms that seem different or that concern you. MAKE SURE YOU:   Understand these instructions.  Will watch your condition.  Will get help right away if you are not doing well or get worse. Document Released: 07/10/2005 Document Revised: 10/02/2011 Document Reviewed: 03/14/2013 [facility] Patient Information 2015 [place], [place]. This information is not intended to replace advice given to you by your health care provider. Make sure you discuss any questions you have with your health care provider.  Chronic Pain Chronic pain can be defined as pain that is off and on and lasts for 3-6 months or longer. Many things cause chronic pain, which can make it difficult to make a diagnosis. There are many treatment options available for chronic pain. However, finding a treatment that works well for you may require trying various approaches until the right one  is found. Many people benefit from a combination of two or more types of treatment to control their pain. SYMPTOMS  Chronic pain can occur anywhere in the body and can range from mild to very severe. Some types of chronic pain include:  Headache.  Low back pain.  Cancer pain.  Arthritis pain.  Neurogenic pain. This is pain resulting from damage to nerves. People with chronic pain may also have other symptoms such  as:  Depression.  Anger.  Insomnia.  Anxiety. DIAGNOSIS  Your health care provider will help diagnose your condition over time. In many cases, the initial focus will be on excluding possible conditions that could be causing the pain. Depending on your symptoms, your health care provider may order tests to diagnose your condition. Some of these tests may include:   Blood tests.   CT scan.   MRI.   X-rays.   Ultrasounds.   Nerve conduction studies.  You may need to see a specialist.  TREATMENT  Finding treatment that works well may take time. You may be referred to a pain specialist. He or she may prescribe medicine or therapies, such as:   Mindful meditation or yoga.  Shots (injections) of numbing or pain-relieving medicines into the spine or area of pain.  Local electrical stimulation.  Acupuncture.   Massage therapy.   Aroma, color, light, or sound therapy.   Biofeedback.   Working with a physical therapist to keep from getting stiff.   Regular, gentle exercise.   Cognitive or behavioral therapy.   Group support.  Sometimes, surgery may be recommended.  HOME CARE INSTRUCTIONS   Take all medicines as directed by your health care provider.   Lessen stress in your life by relaxing and doing things such as listening to calming music.   Exercise or be active as directed by your health care provider.   Eat a healthy diet and include things such as vegetables, fruits, fish, and lean meats in your diet.   Keep all follow-up appointments with your health care provider.   Attend a support group with others suffering from chronic pain. SEEK MEDICAL CARE IF:   Your pain gets worse.   You develop a new pain that was not there before.   You cannot tolerate medicines given to you by your health care provider.   You have new symptoms since your last visit with your health care provider.  SEEK IMMEDIATE MEDICAL CARE IF:   You feel weak.    You have decreased sensation or numbness.   You lose control of bowel or bladder function.   Your pain suddenly gets much worse.   You develop shaking.  You develop chills.  You develop confusion.  You develop chest pain.  You develop shortness of breath.  MAKE SURE YOU:  Understand these instructions.  Will watch your condition.  Will get help right away if you are not doing well or get worse. Document Released: 04/01/2002 Document Revised: 03/12/2013 Document Reviewed: 01/03/2013 [facility] Patient Information 2015 [place], [place]. This information is not intended to replace advice given to you by your health care provider. Make sure you discuss any questions you have with your health care provider.  Heat Therapy Heat therapy can help ease sore, stiff, injured, and tight muscles and joints. Heat relaxes your muscles, which may help ease your pain.  RISKS AND COMPLICATIONS If you have any of the following conditions, do not use heat therapy unless your health care provider has approved:  Poor circulation.  Healing wounds or scarred skin in the area being treated.  Diabetes, heart disease, or high blood pressure.  Not being able to feel (numbness) the area being treated.  Unusual swelling of the area being treated.  Active infections.  Blood clots.  Cancer.  Inability to communicate pain. This may include young children and people who have problems with their brain function (dementia).  Pregnancy. Heat therapy should only be used on old, pre-existing, or long-lasting (chronic) injuries. Do not use heat therapy on new injuries unless directed by your health care provider. HOW TO USE HEAT THERAPY There are several different kinds of heat therapy, including:  Moist heat pack.  Warm water bath.  Hot water bottle.  Electric heating pad.  Heated gel pack.  Heated wrap.  Electric heating pad. Use the heat therapy method suggested by your health care  provider. Follow your health care provider's instructions on when and how to use heat therapy. GENERAL HEAT THERAPY RECOMMENDATIONS  Do not sleep while using heat therapy. Only use heat therapy while you are awake.  Your skin may turn pink while using heat therapy. Do not use heat therapy if your skin turns red.  Do not use heat therapy if you have new pain.  High heat or long exposure to heat can cause burns. Be careful when using heat therapy to avoid burning your skin.  Do not use heat therapy on areas of your skin that are already irritated, such as with a rash or sunburn. SEEK MEDICAL CARE IF:  You have blisters, redness, swelling, or numbness.  You have new pain.  Your pain is worse. MAKE SURE YOU:  Understand these instructions.  Will watch your condition.  Will get help right away if you are not doing well or get worse. Document Released: 10/02/2011 Document Revised: 11/24/2013 Document Reviewed: 09/02/2013 [facility] Patient Information 2015 [place], [place]. This information is not intended to replace advice given to you by your health care provider. Make sure you discuss any questions you have with your health care provider.  Hypertension Hypertension is another name for high blood pressure. High blood pressure forces your heart to work harder to pump blood. A blood pressure reading has two numbers, which includes a higher number over a lower number (example: 110/72). HOME CARE   Have your blood pressure rechecked by your doctor.  Only take medicine as told by your doctor. Follow the directions carefully. The medicine does not work as well if you skip doses. Skipping doses also puts you at risk for problems.  Do not smoke.  Monitor your blood pressure at home as told by your doctor. GET HELP IF:  You think you are having a reaction to the medicine you are taking.  You have repeat headaches or feel dizzy.  You have puffiness (swelling) in your ankles.  You have  trouble with your vision. GET HELP RIGHT AWAY IF:   You get a very bad headache and are confused.  You feel weak, numb, or faint.  You get chest or belly (abdominal) pain.  You throw up (vomit).  You cannot breathe very well. MAKE SURE YOU:   Understand these instructions.  Will watch your condition.  Will get help right away if you are not doing well or get worse. Document Released: 12/27/2007 Document Revised: 07/15/2013 Document Reviewed: 05/02/2013 [facility] Patient Information 2015 [place], [place]. This information is not intended to replace advice given to you by your health care provider. Make sure you discuss any questions you  have with your health care provider. ° °

## 2014-08-13 NOTE — ED Provider Notes (Signed)
CSN: [id number]     Arrival date & time 08/13/14  1232 History   First MD Initiated Contact with Patient 08/13/14 1254     Chief Complaint  Patient presents with  . Arm Pain  . Leg Pain     (Consider location/radiation/quality/duration/timing/severity/associated sxs/prior Treatment) HPI Comments: [name] is a 54 y.o. 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 He states that this is chronic and unchanged since 7 months ago when he fell. He takes gabapentin daily for his pain, as well as baclofen. He has a chronic left-sided weakness from previous, and no focal neurologic deficits or weakness. He states that today were prompted him to come in was the cold weather, which he states has aggravated his pain. He denies any fevers, chills, chest pain, shortness breath, abdominal pain, nausea, vomiting, diarrhea, constipation, dysuria, hematuria, headache, vision changes, dizziness, lightheadedness, syncope, swelling, erythema or warmth of the extremities, IV drug use, no weakness, numbness, or new tingling. He does have baseline peripheral neuropathy from diabetes which is unchanged. PCP at [facility]. No chronic pain management clinic yet.  Patient is a 54 y.o. male presenting with arm pain and leg pain. The history is provided by the patient. No language interpreter was used.  Arm Pain This is a chronic problem. The current episode started more than 1 month ago. The problem occurs constantly. The problem has been unchanged. Associated symptoms include arthralgias (LLE/LUE). Pertinent negatives include no abdominal pain, chest pain, chills,  diaphoresis, fever, headaches, joint swelling, myalgias, nausea, neck pain, numbness, rash, vertigo, vomiting or weakness. Exacerbated by: cold weather. He has tried NSAIDs for the symptoms. The treatment provided moderate relief.  Leg Pain Associated symptoms: no back pain, no fever and no neck pain     Past Medical History  Diagnosis Date  . Diabetes mellitus   . Hypertension   . 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 Cocaine abuse   . Chronic pain   . Alcohol abuse   . Tobacco abuse   . Gait instability   . Chronic arm pain   . Chronic leg pain   . Peripheral edema     chronic LUE   Past Surgical History  Procedure Laterality Date  . Left arm skin graft    . Left heart catheterization with coronary angiogram Bilateral 12/26/2013    Procedure: LEFT HEART CATHETERIZATION WITH CORONARY ANGIOGRAM;  Surgeon: Burnell Blanks, MD;  Location: [facility] CATH LAB;  Service: Cardiovascular;  Laterality: Bilateral;  . Cardiac catheterization  12/27/2013  . Cardiac catheterization  12/27/2013    Impression: 1. 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 Moderate disease in the small caliber intermediate branch, 3. Normal LV systolic function   Family History  Problem Relation Age of Onset  . Hypertension Mother   . Diabetes Mother   . Cancer Mother     breast cancer   . Hypertension Father   . Heart disease Father   . Stroke Father   . Hypertension Sister    History  Substance Use Topics  . Smoking status: Current Every Day Smoker -- 0.25 packs/day  Types: Cigarettes  . Smokeless tobacco: Never Used  . Alcohol Use: Yes     Comment: occassional    Review of Systems  Constitutional: Negative for fever, chills and diaphoresis.  Respiratory: Negative for shortness of breath.   Cardiovascular: Negative for chest pain and leg swelling.  Gastrointestinal: Negative for nausea, vomiting, abdominal pain, diarrhea and constipation.  Genitourinary: Negative for dysuria  and hematuria.  Musculoskeletal: Positive for arthralgias (LLE/LUE). Negative for myalgias, back pain, joint swelling and neck pain.  Skin: Negative for color change and rash.  Allergic/Immunologic: Negative for immunocompromised state.  Neurological: Negative for dizziness, vertigo, weakness, light-headedness, numbness and headaches.       No new focal neuro deficit, baseline chronic LLE/LUE strength deficit  Hematological: Does not bruise/bleed easily.   10 Systems reviewed and are negative for acute change except as noted in the HPI.    Allergies  Review of patient's allergies indicates no known allergies.  Home Medications   Prior to Admission medications   Medication Sig Start Date End Date Taking? Authorizing Provider  acetaminophen (TYLENOL) 325 MG tablet Take 2 tablets (650 mg total) by mouth every 4 (four) hours as needed for mild pain or fever. 01/02/14   Delfina Redwood, MD  aspirin EC 81 MG EC tablet Take 1 tablet (81 mg total) by mouth daily. 01/02/14   Delfina Redwood, MD  baclofen (LIORESAL) 5 mg TABS tablet Take 0.5 tablets (5 mg total) by mouth 3 (three) times daily as needed for muscle spasms. 01/02/14   Delfina Redwood, MD  gabapentin (NEURONTIN) 300 MG capsule Take 1 capsule (300 mg total) by mouth 3 (three) times daily. 07/29/14   Lorayne Marek, MD  glipiZIDE (GLUCOTROL) 10 MG tablet Take 1 tablet (10 mg total) by mouth 2 (two) times daily before a meal. 07/29/14   Lorayne Marek, MD  glucose monitoring kit (FREESTYLE) monitoring kit 1 each by Does not apply route as needed for other. Dispense any model that is covered- dispense testing supplies for Q AC/ HS accuchecks- 1 month supply with one refil. 05/22/14   Lorayne Marek, MD  hydrocortisone 2.5 % cream Apply topically 2 (two) times daily.    Historical Provider, MD  lisinopril (PRINIVIL,ZESTRIL) 20 MG tablet Take 0.5 tablets (10 mg total) by mouth daily. 07/29/14   Lorayne Marek, MD  metFORMIN (GLUCOPHAGE) 1000 MG  tablet Take 1 tablet (1,000 mg total) by mouth 2 (two) times daily with a meal. 06/10/14   Lorayne Marek, MD  simvastatin (ZOCOR) 20 MG tablet Take 1 tablet (20 mg total) by mouth daily at 6 PM. 07/29/14   Deepak Advani, MD   BP 164/82 mmHg  Pulse 87  Temp(Src) 99 F (37.2 C) (Oral)  Resp 18  SpO2 100% Physical Exam  Constitutional: He is oriented to person, place, and time. Vital signs are normal. He appears well-developed and well-nourished.  Non-toxic appearance. No distress.  Afebrile, nontoxic, NAD, VSS  HENT:  Head: Normocephalic and atraumatic.  Mouth/Throat: Oropharynx is clear and moist and mucous membranes are normal.  Eyes: Conjunctivae and EOM are normal. Right eye exhibits no discharge. Left eye exhibits no discharge.  Neck: Normal range of motion. Neck supple.  Cardiovascular: Normal rate, regular rhythm, normal heart sounds and intact distal pulses.  Exam reveals no gallop and no friction rub.   No murmur heard. RRR, nl s1/s2, no m/r/g, distal pulses intact, no pedal edema   Pulmonary/Chest: Effort normal and breath sounds normal. No respiratory distress. He  has no decreased breath sounds. He has no wheezes. He has no rhonchi. He has no rales.  Abdominal: Soft. Normal appearance and bowel sounds are normal. He exhibits no distension. There is no tenderness. There is no rigidity, no rebound and no guarding.  Musculoskeletal: Normal range of motion.  Diffuse LLE/LUE pain with no focal bony/joint line TTP. No edema or warmth, no erythema. No joint swelling or crepitus. Baseline strength and sensation intact, baseline strength deficit in LLE/LUE (4/5) which is unchanged per pt. Baseline ROM intact in all extremities. Distal pulses intact.   Neurological: He is alert and oriented to person, place, and time. No sensory deficit.  Baseline strength and sensation intact.   Skin: Skin is warm, dry and intact. No rash noted.  Psychiatric: He has a normal mood and affect.  Nursing note  and vitals reviewed.   ED Course  Procedures (including critical care time) Labs Review Labs Reviewed - No data to display  Imaging Review No results found.   EKG Interpretation None      MDM   Final diagnoses:  Chronic leg pain, left  Chronic pain of left upper extremity  Essential hypertension    53 y.o. male with chronic L arm/leg pain, aggravated by the cold weather. No swelling or new neuro deficit (baseline strength and sensation noted). No chest pain/SOB or other concerning symptoms. Nonfocal neuro exam and no change in chronic pain. VSS and in NAD, no hypoxia. I believe this is chronic pain and given no concerning s/sx, doubt need for further work up. No erythema or warmth to joints, doubt septic arthritis or gout. Will give tramadol here and to go home with, and discussed PCP f/up to be referred to chronic pain management clinic. I explained the diagnosis and have given explicit precautions to return to the ER including for any other new or worsening symptoms. The patient understands and accepts the medical plan as it's been dictated and I have answered their questions. Discharge instructions concerning home care and prescriptions have been given. The patient is STABLE and is discharged to home in good condition.  BP 164/82 mmHg  Pulse 87  Temp(Src) 99 F (37.2 C) (Oral)  Resp 18  SpO2 100%  Meds ordered this encounter  Medications  . traMADol (ULTRAM) tablet 50 mg    Sig:   . traMADol (ULTRAM) 50 MG tablet    Sig: Take 1 tablet (50 mg total) by mouth every 12 (twelve) hours as needed for moderate pain or severe pain.    Dispense:  15 tablet    Refill:  0    Order Specific Question:  Supervising Provider    Answer:  Noemi Chapel D [[id number]]       Patty Sermons Camprubi-Soms, PA-C 08/13/14 [place], DO 08/16/14 1332

## 2014-09-07 ENCOUNTER — Ambulatory Visit: Payer: Medicaid Other | Admitting: Podiatry

## 2014-09-09 ENCOUNTER — Emergency Department (HOSPITAL_COMMUNITY)
Admission: EM | Admit: 2014-09-09 | Discharge: 2014-09-09 | Disposition: A | Discharge status: Home / Self Care | Payer: Medicaid Other | Attending: Emergency Medicine | Admitting: Emergency Medicine

## 2014-09-09 ENCOUNTER — Encounter (HOSPITAL_COMMUNITY): Payer: Self-pay | Admitting: Family Medicine

## 2014-09-09 DIAGNOSIS — E119 Type 2 diabetes mellitus without complications: Secondary | ICD-10-CM | POA: Insufficient documentation

## 2014-09-09 DIAGNOSIS — Z7982 Long term (current) use of aspirin: Secondary | ICD-10-CM | POA: Diagnosis not present

## 2014-09-09 DIAGNOSIS — L739 Follicular disorder, unspecified: Secondary | ICD-10-CM

## 2014-09-09 DIAGNOSIS — Z720 Tobacco use: Secondary | ICD-10-CM | POA: Diagnosis not present

## 2014-09-09 DIAGNOSIS — Z7952 Long term (current) use of systemic steroids: Secondary | ICD-10-CM | POA: Diagnosis not present

## 2014-09-09 DIAGNOSIS — Z79899 Other long term (current) drug therapy: Secondary | ICD-10-CM | POA: Insufficient documentation

## 2014-09-09 DIAGNOSIS — Z9889 Other specified postprocedural states: Secondary | ICD-10-CM | POA: Diagnosis not present

## 2014-09-09 DIAGNOSIS — I10 Essential (primary) hypertension: Secondary | ICD-10-CM | POA: Insufficient documentation

## 2014-09-09 DIAGNOSIS — G8929 Other chronic pain: Secondary | ICD-10-CM | POA: Diagnosis not present

## 2014-09-09 DIAGNOSIS — L02811 Cutaneous abscess of head [any part, except face]: Secondary | ICD-10-CM | POA: Diagnosis present

## 2014-09-09 LAB — CBC WITH DIFFERENTIAL/PLATELET
BASOS ABS: 0 10*3/uL (ref 0.0–0.1)
Basophils Relative: 0 % (ref 0–1)
Eosinophils Absolute: 0.2 10*3/uL (ref 0.0–0.7)
Eosinophils Relative: 2 % (ref 0–5)
HEMATOCRIT: 39.7 % (ref 39.0–52.0)
Hemoglobin: 13.1 g/dL (ref 13.0–17.0)
LYMPHS PCT: 27 % (ref 12–46)
Lymphs Abs: 2 10*3/uL (ref 0.7–4.0)
MCH: 27.3 pg (ref 26.0–34.0)
MCHC: 33 g/dL (ref 30.0–36.0)
MCV: 82.9 fL (ref 78.0–100.0)
MONO ABS: 0.7 10*3/uL (ref 0.1–1.0)
Monocytes Relative: 10 % (ref 3–12)
Neutro Abs: 4.5 10*3/uL (ref 1.7–7.7)
Neutrophils Relative %: 61 % (ref 43–77)
PLATELETS: 204 10*3/uL (ref 150–400)
RBC: 4.79 MIL/uL (ref 4.22–5.81)
RDW: 14.3 % (ref 11.5–15.5)
WBC: 7.4 10*3/uL (ref 4.0–10.5)

## 2014-09-09 LAB — COMPREHENSIVE METABOLIC PANEL
ALK PHOS: 47 U/L (ref 39–117)
ALT: 15 U/L (ref 0–53)
ANION GAP: 9 (ref 5–15)
AST: 17 U/L (ref 0–37)
Albumin: 3.8 g/dL (ref 3.5–5.2)
BILIRUBIN TOTAL: 0.4 mg/dL (ref 0.3–1.2)
BUN: 9 mg/dL (ref 6–23)
CHLORIDE: 105 mmol/L (ref 96–112)
CO2: 23 mmol/L (ref 19–32)
CREATININE: 0.97 mg/dL (ref 0.50–1.35)
Calcium: 9.1 mg/dL (ref 8.4–10.5)
GFR calc Af Amer: >90 mL/min (ref >90)
GFR calc non Af Amer: >90 mL/min (ref >90)
Glucose, Bld: 76 mg/dL (ref 70–99)
POTASSIUM: 4.1 mmol/L (ref 3.5–5.1)
SODIUM: 137 mmol/L (ref 135–145)
Total Protein: 6.7 g/dL (ref 6.0–8.3)

## 2014-09-09 MED ORDER — SULFAMETHOXAZOLE-TRIMETHOPRIM 800-160 MG PO TABS: 1.0000 | ORAL_TABLET | Freq: Two times a day (BID) | ORAL | Status: DC

## 2014-09-09 MED ORDER — SULFAMETHOXAZOLE-TRIMETHOPRIM 800-160 MG PO TABS
1.0000 | ORAL_TABLET | Freq: Once | ORAL | Status: AC
  Administered 2014-09-09: 1 via ORAL
  Filled 2014-09-09: qty 1

## 2014-09-09 NOTE — Discharge Instructions (Signed)
Wash her hair daily with gentle shampoo. Apply warm compresses to the area as frequently as possible. Check with your primary care doctor for referral to dermatology.  Do not hesitate to return to the emergency room for a new, worsening or concerning symptoms including fever, nausea vomiting, increasing pain or discharge from the affected area.   Please follow with your primary care doctor in the next 2 days for a check-up. They must obtain records for further management.   Do not hesitate to return to the Emergency Department for any new, worsening or concerning symptoms.    Folliculitis  Folliculitis is redness, soreness, and swelling (inflammation) of the hair follicles. This condition can occur anywhere on the body. People with weakened immune systems, diabetes, or obesity have a greater risk of getting folliculitis. CAUSES  Bacterial infection. This is the most common cause.  Fungal infection.  Viral infection.  Contact with certain chemicals, especially oils and tars. Long-term folliculitis can result from bacteria that live in the nostrils. The bacteria may trigger multiple outbreaks of folliculitis over time. SYMPTOMS Folliculitis most commonly occurs on the scalp, thighs, legs, back, buttocks, and areas where hair is shaved frequently. An early sign of folliculitis is a small, white or yellow, pus-filled, itchy lesion (pustule). These lesions appear on a red, inflamed follicle. They are usually less than 0.2 inches (5 mm) wide. When there is an infection of the follicle that goes deeper, it becomes a boil or furuncle. A group of closely packed boils creates a larger lesion (carbuncle). Carbuncles tend to occur in hairy, sweaty areas of the body. DIAGNOSIS  Your caregiver can usually tell what is wrong by doing a physical exam. A sample may be taken from one of the lesions and tested in a lab. This can help determine what is causing your folliculitis. TREATMENT  Treatment may  include:  Applying warm compresses to the affected areas.  Taking antibiotic medicines orally or applying them to the skin.  Draining the lesions if they contain a large amount of pus or fluid.  Laser hair removal for cases of long-lasting folliculitis. This helps to prevent regrowth of the hair. HOME CARE INSTRUCTIONS  Apply warm compresses to the affected areas as directed by your caregiver.  If antibiotics are prescribed, take them as directed. Finish them even if you start to feel better.  You may take over-the-counter medicines to relieve itching.  Do not shave irritated skin.  Follow up with your caregiver as directed. SEEK IMMEDIATE MEDICAL CARE IF:   You have increasing redness, swelling, or pain in the affected area.  You have a fever. MAKE SURE YOU:  Understand these instructions.  Will watch your condition.  Will get help right away if you are not doing well or get worse. Document Released: 09/18/2001 Document Revised: 01/09/2012 Document Reviewed: 10/10/2011 [facility] Patient Information 2015 [place], [place]. This information is not intended to replace advice given to you by your health care provider. Make sure you discuss any questions you have with your health care provider.

## 2014-09-09 NOTE — ED Notes (Signed)
Per pt sts he had multiple abscess drained on head a year ago. sts now he has returning bumps and feeling nauseous with headache.

## 2014-09-09 NOTE — ED Provider Notes (Signed)
CSN: [id number]     Arrival date & time 09/09/14  1239 History  This chart was scribed for non-physician practitioner Monico Blitz, PA-C working with Ezequiel Essex, MD by Zola Button, ED Scribe. This patient was seen in room TR06C/TR06C and the patient's care was started at 3:04 PM.     Chief Complaint  Patient presents with  . Abscess   HPI  HPI Comments: [name] is a 54 y.o. male with a hx of DM and HTN who presents to the Emergency Department complaining of several abscesses on his head that started over 1 year ago but have been gradually worsening. Patient reports having intermittent pain due to the abscesses that he rates a 7/10 in severity. He also notes that the abscesses sometimes bleed at night, which makes him feel nauseous. Patient has not been on antibiotics. He had been noted to have ingrown hairs. He denies fever, chills and vomiting. Patient has not seen a dermatologist. He notes that he only washes his hair once a week. NKDA.   Past Medical History  Diagnosis Date  . Diabetes mellitus   . Hypertension   . 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 Cocaine abuse   . Chronic pain   . Alcohol abuse   . Tobacco abuse   . Gait instability   . Chronic arm pain   . Chronic leg pain   . Peripheral edema     chronic LUE   Past Surgical History  Procedure Laterality Date  . Left arm skin graft    . Left heart catheterization with coronary angiogram Bilateral 12/26/2013    Procedure: LEFT HEART CATHETERIZATION WITH CORONARY ANGIOGRAM;  Surgeon: Burnell Blanks, MD;  Location: [facility] CATH LAB;  Service: Cardiovascular;  Laterality: Bilateral;  . Cardiac catheterization  12/27/2013  . Cardiac catheterization  12/27/2013    Impression: 1. 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 Moderate disease in the small caliber intermediate branch, 3. Normal LV systolic function   Family History  Problem Relation Age of Onset  . Hypertension Mother   . Diabetes  Mother   . Cancer Mother     breast cancer   . Hypertension Father   . Heart disease Father   . Stroke Father   . Hypertension Sister    History  Substance Use Topics  . Smoking status: Current Every Day Smoker -- 0.25 packs/day    Types: Cigarettes  . Smokeless tobacco: Never Used  . Alcohol Use: Yes     Comment: occassional    Review of Systems A complete 10 system review of systems was obtained and all systems are negative except as noted in the HPI and PMH.     Allergies  Review of patient's allergies indicates no known allergies.  Home Medications   Prior to Admission medications   Medication Sig Start Date End Date Taking? Authorizing Provider  acetaminophen (TYLENOL) 325 MG tablet Take 2 tablets (650 mg total) by mouth every 4 (four) hours as needed for mild pain or fever. 01/02/14   Delfina Redwood, MD  aspirin EC 81 MG EC tablet Take 1 tablet (81 mg total) by mouth daily. 01/02/14   Delfina Redwood, MD  baclofen (LIORESAL) 5 mg TABS tablet Take 0.5 tablets (5 mg total) by mouth 3 (three) times daily as needed for muscle spasms. 01/02/14   Delfina Redwood, MD  gabapentin (NEURONTIN) 300 MG capsule Take 1 capsule (300 mg total) by  mouth 3 (three) times daily. 07/29/14   Lorayne Marek, MD  glipiZIDE (GLUCOTROL) 10 MG tablet Take 1 tablet (10 mg total) by mouth 2 (two) times daily before a meal. 07/29/14   Lorayne Marek, MD  glucose monitoring kit (FREESTYLE) monitoring kit 1 each by Does not apply route as needed for other. Dispense any model that is covered- dispense testing supplies for Q AC/ HS accuchecks- 1 month supply with one refil. 05/22/14   Lorayne Marek, MD  hydrocortisone 2.5 % cream Apply topically 2 (two) times daily.    Historical Provider, MD  lisinopril (PRINIVIL,ZESTRIL) 20 MG tablet Take 0.5 tablets (10 mg total) by mouth daily. 07/29/14   Lorayne Marek, MD  metFORMIN (GLUCOPHAGE) 1000 MG tablet Take 1 tablet (1,000 mg total) by mouth 2 (two) times daily  with a meal. 06/10/14   Lorayne Marek, MD  simvastatin (ZOCOR) 20 MG tablet Take 1 tablet (20 mg total) by mouth daily at 6 PM. 07/29/14   Lorayne Marek, MD  sulfamethoxazole-trimethoprim (BACTRIM DS) 800-160 MG per tablet Take 1 tablet by mouth 2 (two) times daily. 09/09/14   Stefanos Haynesworth, PA-C  traMADol (ULTRAM) 50 MG tablet Take 1 tablet (50 mg total) by mouth every 12 (twelve) hours as needed for moderate pain or severe pain. 08/13/14   Mercedes Strupp Camprubi-Soms, PA-C   BP 160/74 mmHg  Pulse 77  Temp(Src) 98.4 F (36.9 C) (Oral)  Resp 18  SpO2 100% Physical Exam  Constitutional: He is oriented to person, place, and time. He appears well-developed and well-nourished. No distress.  HENT:  Head: Normocephalic.  Mouth/Throat: Oropharynx is clear and moist.  Eyes: Conjunctivae and EOM are normal. Pupils are equal, round, and reactive to light.  Neck: Normal range of motion.  Cardiovascular: Normal rate, regular rhythm and intact distal pulses.   Pulmonary/Chest: Effort normal and breath sounds normal. No stridor.  Abdominal: Soft.  Musculoskeletal: Normal range of motion.  Neurological: He is alert and oriented to person, place, and time.  Skin: Rash noted.  Extensive folliculitis to the lower hairline. There are no focal carbuncles. Pressing the area does produce scant purulent drainage. No overlying induration  Psychiatric: He has a normal mood and affect.  Nursing note and vitals reviewed.   ED Course  Procedures  DIAGNOSTIC STUDIES: Oxygen Saturation is 96% on room air, adequate by my interpretation.    COORDINATION OF CARE: 3:13 PM-Discussed treatment plan which includes antibiotics with pt at bedside and pt agreed to plan. Recommend washing hair with baby shampoo or gentle shampoo daily. Apply warm compresses. Check with primary care doctor for referral to dermatology.  Labs Review Labs Reviewed  CBC WITH DIFFERENTIAL/PLATELET  COMPREHENSIVE METABOLIC PANEL     Imaging Review No results found.   EKG Interpretation None      MDM   Final diagnoses:  Chronic folliculitis    Filed Vitals:   09/09/14 1250 09/09/14 1508  BP: 147/73 160/74  Pulse: 81 77  Temp: 98.4 F (36.9 C)   TempSrc: Oral   Resp: 20 18  SpO2: 96% 100%    Medications  sulfamethoxazole-trimethoprim (BACTRIM DS,SEPTRA DS) 800-160 MG per tablet 1 tablet (1 tablet Oral Given 09/09/14 1530)    [name] is a pleasant 54 y.o. male presenting with worsening folliculitis. Patient is diabetic however he has no signs of systemic infection. Blood work is without significant abnormality. I've advised patient that he should wash his hair daily, apply warm compresses to the area, will also start  him on Bactrim. I've encouraged him to follow closely with his PCP (patient has Medicaid and will need a referral to see a dermatologist). We have discussed return precautions.   Evaluation does not show pathology that would require ongoing emergent intervention or inpatient treatment. Pt is hemodynamically stable and mentating appropriately. Discussed findings and plan with patient/guardian, who agrees with care plan. All questions answered. Return precautions discussed and outpatient follow up given.   Discharge Medication List as of 09/09/2014  3:26 PM    START taking these medications   Details  sulfamethoxazole-trimethoprim (BACTRIM DS) 800-160 MG per tablet Take 1 tablet by mouth 2 (two) times daily., Starting 09/09/2014, Until Discontinued, Print        I personally performed the services described in this documentation, which was scribed in my presence. The recorded information has been reviewed and is accurate.    Monico Blitz, PA-C 09/09/14 1618  Ezequiel Essex, MD 09/09/14 [phone]

## 2014-09-11 ENCOUNTER — Telehealth: Payer: Self-pay

## 2014-09-11 NOTE — Telephone Encounter (Signed)
Placed call to patient to follow-up on recent ED visit on 1/74/08 for folliculitis.  Patient indicates he picked up Bactrim on 09/09/14 and has been taking them as prescribed. He indicates the area is "doing better" and "bleeding less." Discussed need for appointment with Dr. Annitta Needs for follow-up; in addition, referral may be needed to dermatology. Patient verbalized understanding and appointment scheduled for 09/18/14 at 1530.  Reiterated ED instructions to wash hair daily, apply warm compresses, and continue taking Bactrim as prescribed. Patient verbalized understanding and appreciative of call.

## 2014-09-16 ENCOUNTER — Ambulatory Visit: Payer: Medicaid Other | Admitting: Podiatry

## 2014-09-18 ENCOUNTER — Encounter: Payer: Self-pay | Admitting: Internal Medicine

## 2014-09-18 ENCOUNTER — Ambulatory Visit: Payer: Medicaid Other | Attending: Internal Medicine | Admitting: Internal Medicine

## 2014-09-18 VITALS — BP 148/87 | HR 84 | Temp 97.7°F | Resp 15 | Wt 262.4 lb

## 2014-09-18 DIAGNOSIS — L739 Follicular disorder, unspecified: Secondary | ICD-10-CM | POA: Insufficient documentation

## 2014-09-18 DIAGNOSIS — E139 Other specified diabetes mellitus without complications: Secondary | ICD-10-CM

## 2014-09-18 DIAGNOSIS — F17210 Nicotine dependence, cigarettes, uncomplicated: Secondary | ICD-10-CM | POA: Diagnosis not present

## 2014-09-18 DIAGNOSIS — R531 Weakness: Secondary | ICD-10-CM | POA: Diagnosis not present

## 2014-09-18 DIAGNOSIS — X58XXXS Exposure to other specified factors, sequela: Secondary | ICD-10-CM | POA: Insufficient documentation

## 2014-09-18 DIAGNOSIS — E119 Type 2 diabetes mellitus without complications: Secondary | ICD-10-CM | POA: Insufficient documentation

## 2014-09-18 DIAGNOSIS — I10 Essential (primary) hypertension: Secondary | ICD-10-CM | POA: Insufficient documentation

## 2014-09-18 DIAGNOSIS — Z7982 Long term (current) use of aspirin: Secondary | ICD-10-CM | POA: Diagnosis not present

## 2014-09-18 DIAGNOSIS — S14109S Unspecified injury at unspecified level of cervical spinal cord, sequela: Secondary | ICD-10-CM | POA: Insufficient documentation

## 2014-09-18 LAB — GLUCOSE, POCT (MANUAL RESULT ENTRY)
POC Glucose: 59 mg/dl — AB (ref 70–99)
POC Glucose: 151 mg/dl — AB (ref 70–99)

## 2014-09-18 NOTE — Progress Notes (Signed)
MRN: [id number] Name: NEZIAH BRALEY  Sex: male Age: 54 y.o. DOB: 1961-02-17  Allergies: Review of patient's allergies indicates no known allergies.  Chief Complaint  Patient presents with  . Follow-up    HPI: Patient is 54 y.o. 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  Past Medical History  Diagnosis Date  . Diabetes mellitus   . Hypertension   . 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 Cocaine abuse   . Chronic pain   . Alcohol abuse   . Tobacco abuse   . Gait instability   . Chronic arm pain   . Chronic leg pain   . Peripheral edema     chronic LUE    Past Surgical History  Procedure Laterality Date  . Left arm skin graft    . Left heart catheterization with coronary angiogram Bilateral 12/26/2013    Procedure: LEFT HEART CATHETERIZATION WITH CORONARY ANGIOGRAM;  Surgeon: Burnell Blanks, MD;  Location: [facility] CATH LAB;  Service: Cardiovascular;  Laterality: Bilateral;  . Cardiac catheterization  12/27/2013  . Cardiac catheterization  12/27/2013    Impression: 1. 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 Moderate disease in the small caliber intermediate branch, 3. Normal LV systolic function      Medication List       This list is accurate as of: 09/18/14  5:49 PM.  Always use your most recent med list.               acetaminophen 325 MG  tablet  Commonly known as:  TYLENOL  Take 2 tablets (650 mg total) by mouth every 4 (four) hours as needed for mild pain or fever.     aspirin 81 MG EC tablet  Take 1 tablet (81 mg total) by mouth daily.     baclofen 5 mg Tabs tablet  Commonly known as:  LIORESAL  Take 0.5 tablets (5 mg total) by mouth 3 (three) times daily as needed for muscle spasms.     gabapentin 300 MG capsule  Commonly known as:  NEURONTIN  Take 1 capsule (300 mg total) by mouth 3 (three) times daily.     glipiZIDE 10 MG tablet  Commonly known as:  GLUCOTROL  Take 1 tablet (10 mg total) by mouth 2 (two) times daily before a meal.     glucose monitoring kit monitoring kit  1 each by Does not apply route as needed for other. Dispense any model that is covered- dispense testing supplies for Q AC/ HS accuchecks- 1 month supply with one refil.     hydrocortisone 2.5 % cream  Apply topically 2 (two) times daily.     lisinopril 20 MG tablet  Commonly known as:  PRINIVIL,ZESTRIL  Take 0.5 tablets (10 mg total) by mouth daily.     metFORMIN 1000 MG tablet  Commonly known as:  GLUCOPHAGE  Take 1 tablet (1,000 mg total) by mouth 2 (two) times daily with a meal.     simvastatin 20 MG tablet  Commonly known as:  ZOCOR  Take 1 tablet (20 mg total) by mouth daily at 6 PM.     sulfamethoxazole-trimethoprim 800-160 MG per tablet  Commonly known as:  BACTRIM DS  Take 1 tablet by mouth 2 (two) times daily.     traMADol 50 MG tablet  Commonly known as:  ULTRAM  Take 1 tablet (50 mg total) by mouth every 12 (twelve) hours as needed for moderate pain or severe pain.        No orders of the defined types were placed in this encounter.    Immunization History  Administered Date(s) Administered  . Influenza,inj,Quad PF,36+ Mos 05/22/2014    Family History  Problem Relation Age of Onset  . Hypertension Mother   . Diabetes Mother   . Cancer Mother     breast cancer   . Hypertension Father   . Heart disease  Father   . Stroke Father   . Hypertension Sister     History  Substance Use Topics  . Smoking status: Current Every Day Smoker -- 0.25 packs/day    Types: Cigarettes  . Smokeless tobacco: Never Used  . Alcohol Use: Yes     Comment: occassional    Review of Systems   As noted in HPI  Filed Vitals:   09/18/14 1506  BP: 148/87  Pulse: 84  Temp: 97.7 F (36.5 C)  Resp: 15    Physical Exam  Physical Exam  Constitutional: No distress.  HENT:  In the back of scalp/neck minimal follicular lesions( patient reported improvement)   Eyes: EOM are normal. Pupils are equal, round, and reactive to light.  Cardiovascular: Normal rate and regular rhythm.   Pulmonary/Chest: Breath sounds normal. No respiratory distress. He has no wheezes. He has no rales.  Musculoskeletal: He exhibits no edema.    CBC    Component Value Date/Time   WBC 7.4 09/09/2014 1314   RBC 4.79 09/09/2014 1314   HGB 13.1 09/09/2014 1314   HCT 39.7 09/09/2014 1314   PLT 204 09/09/2014 1314   MCV 82.9 09/09/2014 1314   LYMPHSABS 2.0 09/09/2014 1314   MONOABS 0.7 09/09/2014 1314   EOSABS 0.2 09/09/2014 1314   BASOSABS 0.0 09/09/2014 1314    CMP     Component Value Date/Time   NA 137 09/09/2014 1314   K 4.1 09/09/2014 1314   CL 105 09/09/2014 1314   CO2 23 09/09/2014 1314   GLUCOSE 76 09/09/2014 1314   BUN 9 09/09/2014 1314   CREATININE 0.97 09/09/2014 1314   CALCIUM 9.1 09/09/2014 1314   PROT 6.7 09/09/2014 1314   ALBUMIN 3.8 09/09/2014 1314   AST 17 09/09/2014 1314   ALT 15 09/09/2014 1314   ALKPHOS 47 09/09/2014 1314   BILITOT 0.4 09/09/2014 1314   GFRNONAA >90 09/09/2014 1314   GFRAA >90 09/09/2014 1314    Lab Results  Component Value Date/Time   CHOL 204* 12/28/2013 01:40 PM    No components found for: HGA1C  Lab Results  Component Value Date/Time   AST 17 09/09/2014 01:14 PM    Assessment and Plan  Other specified diabetes mellitus without complications - Plan:  Results  for orders placed or performed in visit on 09/18/14  Glucose (CBG)  Result Value Ref Range   POC Glucose 59 (A) 70 - 99 mg/dl  Glucose (CBG)  Result Value Ref Range   POC Glucose 151 (A) 70 - 99 mg/dl  his blood sugar initially was low as per patient he has not eaten lunch, he has taken the medication in the morning, advise patient to keep the fingerstick log continue with current meds, repeat A1c in 3 months.   Chronic folliculitis - Plan: Ambulatory referral to Dermatology  Contusion of cervical cord, sequela - Plan: Ambulatory referral to Physical Therapy   Health Maintenance  -Vaccinations:  Up-to-date with Flu shot  Return in about 3 months (around 12/17/2014) for diabetes.   This note has been created with Surveyor, quantity. Any transcriptional errors are unintentional.    Lorayne Marek, MD

## 2014-09-18 NOTE — Patient Instructions (Signed)
Diabetes Mellitus and Food It is important for you to manage your blood sugar (glucose) level. Your blood glucose level can be greatly affected by what you eat. Eating healthier foods in the appropriate amounts throughout the day at about the same time each day will help you control your blood glucose level. It can also help slow or prevent worsening of your diabetes mellitus. Healthy eating may even help you improve the level of your blood pressure and reach or maintain a healthy weight.  HOW CAN FOOD AFFECT ME? Carbohydrates Carbohydrates affect your blood glucose level more than any other type of food. Your dietitian will help you determine how many carbohydrates to eat at each meal and teach you how to count carbohydrates. Counting carbohydrates is important to keep your blood glucose at a healthy level, especially if you are using insulin or taking certain medicines for diabetes mellitus. Alcohol Alcohol can cause sudden decreases in blood glucose (hypoglycemia), especially if you use insulin or take certain medicines for diabetes mellitus. Hypoglycemia can be a life-threatening condition. Symptoms of hypoglycemia (sleepiness, dizziness, and disorientation) are similar to symptoms of having too much alcohol.  If your health care provider has given you approval to drink alcohol, do so in moderation and use the following guidelines:  Women should not have more than one drink per day, and men should not have more than two drinks per day. One drink is equal to:  12 oz of beer.  5 oz of wine.  1 oz of hard liquor.  Do not drink on an empty stomach.  Keep yourself hydrated. Have water, diet soda, or unsweetened iced tea.  Regular soda, juice, and other mixers might contain a lot of carbohydrates and should be counted. WHAT FOODS ARE NOT RECOMMENDED? As you make food choices, it is important to remember that all foods are not the same. Some foods have fewer nutrients per serving than other  foods, even though they might have the same number of calories or carbohydrates. It is difficult to get your body what it needs when you eat foods with fewer nutrients. Examples of foods that you should avoid that are high in calories and carbohydrates but low in nutrients include:  Trans fats (most processed foods list trans fats on the Nutrition Facts label).  Regular soda.  Juice.  Candy.  Sweets, such as cake, pie, doughnuts, and cookies.  Fried foods. WHAT FOODS CAN I EAT? Have nutrient-rich foods, which will nourish your body and keep you healthy. The food you should eat also will depend on several factors, including:  The calories you need.  The medicines you take.  Your weight.  Your blood glucose level.  Your blood pressure level.  Your cholesterol level. You also should eat a variety of foods, including:  Protein, such as meat, poultry, fish, tofu, nuts, and seeds (lean animal proteins are best).  Fruits.  Vegetables.  Dairy products, such as milk, cheese, and yogurt (low fat is best).  Breads, grains, pasta, cereal, rice, and beans.  Fats such as olive oil, trans fat-free margarine, canola oil, avocado, and olives. DOES EVERYONE WITH DIABETES MELLITUS HAVE THE SAME MEAL PLAN? Because every person with diabetes mellitus is different, there is not one meal plan that works for everyone. It is very important that you meet with a dietitian who will help you create a meal plan that is just right for you. Document Released: 04/06/2005 Document Revised: 07/15/2013 Document Reviewed: 06/06/2013 ExitCare Patient Information 2015 [employer]. This   information is not intended to replace advice given to you by your health care provider. Make sure you discuss any questions you have with your health care provider.  

## 2014-09-18 NOTE — Progress Notes (Signed)
Patient here for follow up from the ED Patient has a history of folliculitis Patient is currently on ABT for his infection Blood sugar in office was low so snacks and drink provided

## 2014-09-28 ENCOUNTER — Ambulatory Visit (INDEPENDENT_AMBULATORY_CARE_PROVIDER_SITE_OTHER): Payer: Medicaid Other | Admitting: Podiatry

## 2014-09-28 ENCOUNTER — Encounter: Payer: Self-pay | Admitting: Podiatry

## 2014-09-28 DIAGNOSIS — B351 Tinea unguium: Secondary | ICD-10-CM

## 2014-09-28 DIAGNOSIS — M79676 Pain in unspecified toe(s): Secondary | ICD-10-CM | POA: Diagnosis not present

## 2014-09-28 NOTE — Patient Instructions (Signed)
Diabetes and Foot Care Diabetes may cause you to have problems because of poor blood supply (circulation) to your feet and legs. This may cause the skin on your feet to become thinner, break easier, and heal more slowly. Your skin may become dry, and the skin may peel and crack. You may also have nerve damage in your legs and feet causing decreased feeling in them. You may not notice minor injuries to your feet that could lead to infections or more serious problems. Taking care of your feet is one of the most important things you can do for yourself.  HOME CARE INSTRUCTIONS  Wear shoes at all times, even in the house. Do not go barefoot. Bare feet are easily injured.  Check your feet daily for blisters, cuts, and redness. If you cannot see the bottom of your feet, use a mirror or ask someone for help.  Wash your feet with warm water (do not use hot water) and mild soap. Then pat your feet and the areas between your toes until they are completely dry. Do not soak your feet as this can dry your skin.  Apply a moisturizing lotion or petroleum jelly (that does not contain alcohol and is unscented) to the skin on your feet and to dry, brittle toenails. Do not apply lotion between your toes.  Trim your toenails straight across. Do not dig under them or around the cuticle. File the edges of your nails with an emery board or nail file.  Do not cut corns or calluses or try to remove them with medicine.  Wear clean socks or stockings every day. Make sure they are not too tight. Do not wear knee-high stockings since they may decrease blood flow to your legs.  Wear shoes that fit properly and have enough cushioning. To break in new shoes, wear them for just a few hours a day. This prevents you from injuring your feet. Always look in your shoes before you put them on to be sure there are no objects inside.  Do not cross your legs. This may decrease the blood flow to your feet.  If you find a minor scrape,  cut, or break in the skin on your feet, keep it and the skin around it clean and dry. These areas may be cleansed with mild soap and water. Do not cleanse the area with peroxide, alcohol, or iodine.  When you remove an adhesive bandage, be sure not to damage the skin around it.  If you have a wound, look at it several times a day to make sure it is healing.  Do not use heating pads or hot water bottles. They may burn your skin. If you have lost feeling in your feet or legs, you may not know it is happening until it is too late.  Make sure your health care provider performs a complete foot exam at least annually or more often if you have foot problems. Report any cuts, sores, or bruises to your health care provider immediately. SEEK MEDICAL CARE IF:   You have an injury that is not healing.  You have cuts or breaks in the skin.  You have an ingrown nail.  You notice redness on your legs or feet.  You feel burning or tingling in your legs or feet.  You have pain or cramps in your legs and feet.  Your legs or feet are numb.  Your feet always feel cold. SEEK IMMEDIATE MEDICAL CARE IF:   There is increasing redness,   swelling, or pain in or around a wound.  There is a red line that goes up your leg.  Pus is coming from a wound.  You develop a fever or as directed by your health care provider.  You notice a bad smell coming from an ulcer or wound. Document Released: 07/07/2000 Document Revised: 03/12/2013 Document Reviewed: 12/17/2012 ExitCare Patient Information 2015 [employer]. This information is not intended to replace advice given to you by your health care provider. Make sure you discuss any questions you have with your health care provider.  

## 2014-09-29 NOTE — Progress Notes (Signed)
Patient ID: [name], male   DOB: 01-Oct-1960, 54 y.o.   MRN: [id number]  Subjective: This patient presents today complaining of painful toenails and requests debridement  Objective: The toenails are hypertrophic, elongated, brittle, discolored and tender to palpation 6-10 Keratoses left hallux  Assessment: Symptomatic onychomycoses 6-10 Keratoses Diabetes without complication  Plan: Debridement of toenails 10 and keratoses 1 without any bleeding  Reappoint 3 months

## 2014-10-06 ENCOUNTER — Ambulatory Visit: Payer: Medicaid Other | Attending: Internal Medicine

## 2014-10-06 DIAGNOSIS — M2560 Stiffness of unspecified joint, not elsewhere classified: Secondary | ICD-10-CM | POA: Insufficient documentation

## 2014-10-06 DIAGNOSIS — R2689 Other abnormalities of gait and mobility: Secondary | ICD-10-CM

## 2014-10-06 DIAGNOSIS — IMO0002 Reserved for concepts with insufficient information to code with codable children: Secondary | ICD-10-CM

## 2014-10-06 DIAGNOSIS — R262 Difficulty in walking, not elsewhere classified: Secondary | ICD-10-CM

## 2014-10-06 DIAGNOSIS — R29898 Other symptoms and signs involving the musculoskeletal system: Secondary | ICD-10-CM

## 2014-10-06 DIAGNOSIS — R29818 Other symptoms and signs involving the nervous system: Secondary | ICD-10-CM | POA: Insufficient documentation

## 2014-10-06 NOTE — Therapy (Addendum)
[place], [place], [address] Phone: [phone]   Fax:  [phone]  Physical Therapy Evaluation  Patient Details  Name: [name] MRN: [id number] Date of Birth: 1961/03/15 Referring Provider:  Lorayne Marek, MD  Encounter Date: 10/06/2014      PT End of Session - 10/06/14 0857    Visit Number 1   Number of Visits 8   Date for PT Re-Evaluation 11/17/14   PT Start Time 0753   PT Stop Time 0850   PT Time Calculation (min) 57 min   Activity Tolerance Patient tolerated treatment well   Behavior During Therapy [facility] for tasks assessed/performed      Past Medical History  Diagnosis Date  . Diabetes mellitus   . Hypertension   . 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 Cocaine abuse   . Chronic pain   . Alcohol abuse   . Tobacco abuse   . Gait instability   . Chronic arm pain   . Chronic leg pain   . Peripheral edema     chronic LUE    Past Surgical History  Procedure Laterality Date  . Left arm skin graft    . Left heart catheterization with coronary angiogram Bilateral 12/26/2013    Procedure: LEFT HEART CATHETERIZATION WITH CORONARY ANGIOGRAM;  Surgeon: Burnell Blanks, MD;  Location[facility] CATH LAB;  Service: Cardiovascular;  Laterality: Bilateral;  . Cardiac catheterization  12/27/2013  . Cardiac catheterization  12/27/2013    Impression: 1. 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 Moderate disease in the small caliber intermediate branch, 3. Normal LV systolic function    There were no vitals filed for this visit.  Visit Diagnosis:  Weakness of left lower extremity - Plan: PT plan of care cert/re-cert  Stiffness of joint of lower extremity - Plan: PT plan of care cert/re-cert  Difficulty walking - Plan: PT plan of care cert/re-cert  Balance disorder - Plan: PT plan of care cert/re-cert      Subjective Assessment - 10/06/14 0759    Symptoms He reports he  needs Pt for Lt hand and foot with swelling.    Pertinent History He has a history of  weakness   Limitations Walking;Standing;House hold activities   Patient Stated Goals Paperwork filled out for SCAT and Medicaid review   Currently in Pain? No/denies  Intermittant pain in LT knee and foot   Multiple Pain Sites No            [facility] PT Assessment - 10/06/14 0752    Assessment   Medical Diagnosis cervical cord contusion   Onset Date 12/26/13   Prior Therapy 06/22/14 PT eval in Neuro   Precautions   Precautions Fall   Restrictions   Weight Bearing Restrictions No   Balance Screen   Has the patient fallen in the past 6 months Yes   How many times? 1   Has the patient had a decrease in activity level because of a fear of falling?  Yes   Is the patient reluctant to leave their home because of a fear of falling?  Yes   Prior Function   Level of Independence Independent with basic ADLs   Ambulation/Gait   Ambulation/Gait Yes   Ambulation/Gait Assistance 6: Modified independent (Device/Increase time)   Assistive device Small based quad cane   Gait Pattern Decreased step length - right;Decreased step length - left   Ambulation Surface Level;Indoor  6 Minute Walk- Baseline   6 Minute Walk- Baseline yes  600 feet and at 4 min repors burning in feet and legs   HR (bpm) 72   Modified Borg Scale for Dyspnea 0- Nothing at all   Perceived Rate of Exertion (Borg) 14-   Standardized Balance Assessment   Standardized Balance Assessment Timed Up and Go Test;Berg Balance Test   Berg Balance Test   Sit to Stand Able to stand using hands after several tries   Standing Unsupported Able to stand safely 2 minutes   Sitting with Back Unsupported but Feet Supported on Floor or Stool Able to sit safely and securely 2 minutes   Stand to Sit Controls descent by using hands   Transfers Able to transfer with verbal cueing and /or supervision   Standing Unsupported with Eyes Closed Able to stand 10  seconds safely   Standing Ubsupported with Feet Together Able to place feet together independently but unable to hold for 30 seconds   From Standing, Reach Forward with Outstretched Arm Can reach forward >5 cm safely (2")   From Standing Position, Pick up Object from Floor Able to pick up shoe, needs supervision   From Standing Position, Turn to Look Behind Over each Shoulder Needs supervision when turning   Turn 360 Degrees Needs close supervision or verbal cueing   Standing Unsupported, Alternately Place Feet on Step/Stool Able to complete >2 steps/needs minimal assist   Standing Unsupported, One Foot in Front Able to take small step independently and hold 30 seconds   Standing on One Leg Tries to lift leg/unable to hold 3 seconds but remains standing independently   Total Score 32   Timed Up and Go Test   Normal TUG (seconds) --  second trial 33.68 sec   Functional Gait  Assessment   Gait assessed  Yes                                PT Long Term Goals - 10/06/14 0859    PT LONG TERM GOAL #1   Title He will be able to demo all HEP issued as of last visit   Baseline No HEP   Time 6   Period Weeks   Status New   PT LONG TERM GOAL #2   Title Pt will improve TUG score to 25 seconds   Baseline TUG best score 33 .6 sec   Time 6   Period Weeks   Status New   PT LONG TERM GOAL #3   Title Pt will improve BERG score to 40/53   Baseline Score 32/56   Time 6   Period Weeks   Status New               Plan - 10/06/14 6789    Clinical Impression Statement LT sided weakness , decreased balance, slow gait speed, increased effort wiuth transitional movements   Pt will benefit from skilled therapeutic intervention in order to improve on the following deficits Abnormal gait;Decreased strength;Decreased balance   Rehab Potential Fair   PT Frequency 1x / week   PT Duration 6 weeks   PT Treatment/Interventions Patient/family education;Therapeutic  exercise;Therapeutic activities   PT Next Visit Plan Strengthening program , stretching   PT Home Exercise Plan strength   Consulted and Agree with Plan of Care Patient     His Transit form was filled out but he will need to have the company  that was at his home and did a home assessment to give the information about self care in home for Medicaid assistance in home He also need rolling walker for decreased risk of fall but Medicaid will probably not pay for this.    Problem List Patient Active Problem List   Diagnosis Date Noted  . Callus of foot 05/22/2014  . Contusion of cervical cord 05/22/2014  . Other specified diabetes mellitus without complications 92/42/6834  . Essential hypertension 05/22/2014  . Hyperlipidemia 05/22/2014  . Coronary vasospasm, cocaine related 12/29/2013  . Cocaine abuse 12/28/2013  . Alcoholism /alcohol abuse 12/28/2013  . Spinal cord contusion 12/27/2013  . Altered mental status 12/26/2013  . Weakness 12/26/2013  . HYPERLIPIDEMIA 04/01/2007  . DIABETES MELLITUS, TYPE II 03/28/2007  . OBESITY NOS 03/28/2007  . HYPERTENSION 03/28/2007    Darrel Hoover PT 10/06/2014, 9:08 AM  [facility] [address] [place], [place], [address] Phone: [phone]   Fax:  [phone]    PHYSICAL THERAPY DISCHARGE SUMMARY  Visits from Start of Care: 1  Current functional level related to goals / functional outcomes: He did not return after eval   Remaining deficits: Unknown   Education / Equipment: NA Plan:                                                    Patient goals were not met. Patient is being discharged due to not returning since the last visit.  ?????    Darrel Hoover, PT    06/01/15    10:07 AM

## 2014-10-21 ENCOUNTER — Encounter (HOSPITAL_COMMUNITY): Payer: Self-pay | Admitting: Emergency Medicine

## 2014-10-21 ENCOUNTER — Emergency Department (HOSPITAL_COMMUNITY)
Admission: EM | Admit: 2014-10-21 | Discharge: 2014-10-21 | Disposition: A | Discharge status: Home / Self Care | Payer: Medicaid Other | Attending: Emergency Medicine | Admitting: Emergency Medicine

## 2014-10-21 DIAGNOSIS — I10 Essential (primary) hypertension: Secondary | ICD-10-CM | POA: Insufficient documentation

## 2014-10-21 DIAGNOSIS — R2689 Other abnormalities of gait and mobility: Secondary | ICD-10-CM | POA: Diagnosis not present

## 2014-10-21 DIAGNOSIS — M6281 Muscle weakness (generalized): Secondary | ICD-10-CM | POA: Insufficient documentation

## 2014-10-21 DIAGNOSIS — Z9889 Other specified postprocedural states: Secondary | ICD-10-CM | POA: Diagnosis not present

## 2014-10-21 DIAGNOSIS — Z79899 Other long term (current) drug therapy: Secondary | ICD-10-CM | POA: Diagnosis not present

## 2014-10-21 DIAGNOSIS — Z7982 Long term (current) use of aspirin: Secondary | ICD-10-CM | POA: Insufficient documentation

## 2014-10-21 DIAGNOSIS — Z720 Tobacco use: Secondary | ICD-10-CM | POA: Insufficient documentation

## 2014-10-21 DIAGNOSIS — Z792 Long term (current) use of antibiotics: Secondary | ICD-10-CM | POA: Diagnosis not present

## 2014-10-21 DIAGNOSIS — G8929 Other chronic pain: Secondary | ICD-10-CM | POA: Insufficient documentation

## 2014-10-21 DIAGNOSIS — M79605 Pain in left leg: Secondary | ICD-10-CM | POA: Diagnosis present

## 2014-10-21 DIAGNOSIS — F419 Anxiety disorder, unspecified: Secondary | ICD-10-CM | POA: Diagnosis not present

## 2014-10-21 DIAGNOSIS — E119 Type 2 diabetes mellitus without complications: Secondary | ICD-10-CM | POA: Insufficient documentation

## 2014-10-21 DIAGNOSIS — R269 Unspecified abnormalities of gait and mobility: Secondary | ICD-10-CM

## 2014-10-21 DIAGNOSIS — R600 Localized edema: Secondary | ICD-10-CM | POA: Diagnosis not present

## 2014-10-21 LAB — CBG MONITORING, ED: Glucose-Capillary: 146 mg/dL — ABNORMAL HIGH (ref 70–99)

## 2014-10-21 NOTE — ED Notes (Signed)
Pt is c/o muscle pain in his legs  Pt states he feels like his legs are going to lock up on him and he is afraid of falling

## 2014-10-21 NOTE — ED Provider Notes (Signed)
CSN: [id number]     Arrival date & time 10/21/14  2050 History   This chart was scribed for non-physician practitioner Junius Creamer NP working with Blanchie Dessert, MD by Lora Havens, ED Scribe. This patient was seen in [facility] and the patient's care was started at 10:13 PM.   Chief Complaint  Patient presents with  . Leg Pain   The history is provided by the patient.    HPI Comments: [name] is a 54 y.o. male who presents to the Emergency Department complaining of worsening weakness in his left leg acute onset tonight. Pt states he was walking and the muscles in his legs began "tightening up". 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  He was seen by his physical therapist on March 15 th for this complaint. He states Medicaid will not pay for his sessions. He notes he cannot walk or stand for a long period of time.  Past Medical History  Diagnosis Date  . Diabetes mellitus   . Hypertension   . 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 Cocaine abuse   . Chronic pain   . Alcohol abuse   . Tobacco abuse   . Gait instability   . Chronic arm pain   . Chronic leg pain   . Peripheral edema     chronic LUE   Past Surgical History  Procedure Laterality Date  . Left arm skin graft    . Left heart catheterization with coronary angiogram Bilateral 12/26/2013    Procedure: LEFT HEART CATHETERIZATION WITH CORONARY ANGIOGRAM;  Surgeon: Burnell Blanks, MD;  Location: [facility] CATH LAB;  Service: Cardiovascular;  Laterality: Bilateral;  . Cardiac catheterization  12/27/2013  . Cardiac catheterization  12/27/2013    Impression: 1. 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 Moderate disease in the small caliber intermediate branch, 3. Normal LV systolic function   Family History  Problem Relation Age of Onset  . Hypertension Mother   . Diabetes Mother   . Cancer Mother     breast cancer   .  Hypertension Father   . Heart disease Father   . Stroke Father   . Hypertension Sister    History  Substance Use Topics  . Smoking status: Current Every Day Smoker -- 0.25 packs/day    Types: Cigarettes  . Smokeless tobacco: Never Used  . Alcohol Use: Yes     Comment: occassional    Review of Systems  Constitutional: Negative for fever.  Musculoskeletal: Positive for gait problem.  Neurological: Positive for weakness. Negative for numbness.  All other systems reviewed and are negative.   Allergies  Review of patient's allergies indicates no known allergies.  Home Medications   Prior to Admission medications   Medication Sig Start Date End Date Taking? Authorizing Provider  gabapentin (NEURONTIN) 300 MG capsule Take 1 capsule (300 mg total) by mouth 3 (three) times daily. 07/29/14  Yes Lorayne Marek, MD  glipiZIDE (GLUCOTROL) 10 MG tablet Take 1 tablet (10 mg total) by mouth 2 (two) times daily before a meal. 07/29/14  Yes Deepak Advani, MD  glucose monitoring kit (FREESTYLE) monitoring kit 1 each by Does not apply route as needed for other. Dispense any model that is covered- dispense testing supplies for Q AC/ HS accuchecks- 1 month supply with one refil. 05/22/14  Yes Lorayne Marek, MD  lisinopril (PRINIVIL,ZESTRIL) 20 MG  tablet Take 0.5 tablets (10 mg total) by mouth daily. 07/29/14  Yes Lorayne Marek, MD  metFORMIN (GLUCOPHAGE) 1000 MG tablet Take 1 tablet (1,000 mg total) by mouth 2 (two) times daily with a meal. 06/10/14  Yes Deepak Advani, MD  simvastatin (ZOCOR) 20 MG tablet Take 1 tablet (20 mg total) by mouth daily at 6 PM. 07/29/14  Yes Lorayne Marek, MD  acetaminophen (TYLENOL) 325 MG tablet Take 2 tablets (650 mg total) by mouth every 4 (four) hours as needed for mild pain or fever. Patient not taking: Reported on 10/06/2014 01/02/14   Delfina Redwood, MD  aspirin EC 81 MG EC tablet Take 1 tablet (81 mg total) by mouth daily. Patient not taking: Reported on 10/06/2014  01/02/14   Delfina Redwood, MD  baclofen (LIORESAL) 5 mg TABS tablet Take 0.5 tablets (5 mg total) by mouth 3 (three) times daily as needed for muscle spasms. Patient not taking: Reported on 10/21/2014 01/02/14   Delfina Redwood, MD  sulfamethoxazole-trimethoprim (BACTRIM DS) 800-160 MG per tablet Take 1 tablet by mouth 2 (two) times daily. Patient not taking: Reported on 10/21/2014 09/09/14   Elmyra Ricks Pisciotta, PA-C  traMADol (ULTRAM) 50 MG tablet Take 1 tablet (50 mg total) by mouth every 12 (twelve) hours as needed for moderate pain or severe pain. Patient not taking: Reported on 10/06/2014 08/13/14   Mercedes Camprubi-Soms, PA-C   BP 127/64 mmHg  Pulse 85  Temp(Src) 97.6 F (36.4 C) (Oral)  Resp 16  SpO2 96% Physical Exam  Constitutional: He appears well-developed and well-nourished.  Eyes: Pupils are equal, round, and reactive to light.  Neck: Normal range of motion.  Cardiovascular: Normal rate.   Pulmonary/Chest: Effort normal.  Musculoskeletal: Normal range of motion. He exhibits edema.  Left hand slightly swollen but full range of motion of all fingers and wrist  Neurological: He is alert.  Skin: Skin is warm.  Nursing note and vitals reviewed.   ED Course  Procedures  DIAGNOSTIC STUDIES: Oxygen Saturation is 96% on room air, normal by my interpretation.    COORDINATION OF CARE: 10:20 PM Discussed treatment plan with pt at bedside and pt agreed to plan.  Labs Review Labs Reviewed  CBG MONITORING, ED - Abnormal; Notable for the following:    Glucose-Capillary 146 (*)    All other components within normal limits   Imaging Review No results found.   EKG Interpretation None     Patient has ongoing muscle weakness he states while walking tonight his left hand was perceived to be weaker he has full range of motion has significant swelling to the hand but does not do any sort of home physical therapy. He states when he was at physical therapy on March 15 and  recommended that he start using his walker again he has not he's been using his cane in his left hand I advised the patient to start using his walker again follow-up with his primary care physician and start doing some home physical therapy to keep his muscles as strong and active as possible MDM   Final diagnoses:  None    I personally performed the services described in this documentation, which was scribed in my presence. The recorded information has been reviewed and is accurate.     Junius Creamer, NP 10/21/14 2841  Blanchie Dessert, MD 10/22/14 1539

## 2014-10-21 NOTE — Discharge Instructions (Signed)
Fall Prevention and Home Safety Falls cause injuries and can affect all age groups. It is possible to use preventive measures to significantly decrease the likelihood of falls. There are many simple measures which can make your home safer and prevent falls. OUTDOORS  Repair cracks and edges of walkways and driveways.  Remove high doorway thresholds.  Trim shrubbery on the main path into your home.  Have good outside lighting.  Clear walkways of tools, rocks, debris, and clutter.  Check that handrails are not broken and are securely fastened. Both sides of steps should have handrails.  Have leaves, snow, and ice cleared regularly.  Use sand or salt on walkways during winter months.  In the garage, clean up grease or oil spills. BATHROOM  Install night lights.  Install grab bars by the toilet and in the tub and shower.  Use non-skid mats or decals in the tub or shower.  Place a plastic non-slip stool in the shower to sit on, if needed.  Keep floors dry and clean up all water on the floor immediately.  Remove soap buildup in the tub or shower on a regular basis.  Secure bath mats with non-slip, double-sided rug tape.  Remove throw rugs and tripping hazards from the floors. BEDROOMS  Install night lights.  Make sure a bedside light is easy to reach.  Do not use oversized bedding.  Keep a telephone by your bedside.  Have a firm chair with side arms to use for getting dressed.  Remove throw rugs and tripping hazards from the floor. KITCHEN  Keep handles on pots and pans turned toward the center of the stove. Use back burners when possible.  Clean up spills quickly and allow time for drying.  Avoid walking on wet floors.  Avoid hot utensils and knives.  Position shelves so they are not too high or low.  Place commonly used objects within easy reach.  If necessary, use a sturdy step stool with a grab bar when reaching.  Keep electrical cables out of the  way.  Do not use floor polish or wax that makes floors slippery. If you must use wax, use non-skid floor wax.  Remove throw rugs and tripping hazards from the floor. STAIRWAYS  Never leave objects on stairs.  Place handrails on both sides of stairways and use them. Fix any loose handrails. Make sure handrails on both sides of the stairways are as long as the stairs.  Check carpeting to make sure it is firmly attached along stairs. Make repairs to worn or loose carpet promptly.  Avoid placing throw rugs at the top or bottom of stairways, or properly secure the rug with carpet tape to prevent slippage. Get rid of throw rugs, if possible.  Have an electrician put in a light switch at the top and bottom of the stairs. OTHER FALL PREVENTION TIPS  Wear low-heel or rubber-soled shoes that are supportive and fit well. Wear closed toe shoes.  When using a stepladder, make sure it is fully opened and both spreaders are firmly locked. Do not climb a closed stepladder.  Add color or contrast paint or tape to grab bars and handrails in your home. Place contrasting color strips on first and last steps.  Learn and use mobility aids as needed. Install an electrical emergency response system.  Turn on lights to avoid dark areas. Replace light bulbs that burn out immediately. Get light switches that glow.  Arrange furniture to create clear pathways. Keep furniture in the same place.  Firmly attach carpet with non-skid or double-sided tape.  Eliminate uneven floor surfaces.  Select a carpet pattern that does not visually hide the edge of steps.  Be aware of all pets. OTHER HOME SAFETY TIPS  Set the water temperature for 120 F (48.8 C).  Keep emergency numbers on or near the telephone.  Keep smoke detectors on every level of the home and near sleeping areas. Document Released: 06/30/2002 Document Revised: 01/09/2012 Document Reviewed: 09/29/2011 [facility] Patient Information 2015  [place], [place]. This information is not intended to replace advice given to you by your health care provider. Make sure you discuss any questions you have with your health care provider. As discussed pleased door using your walker again inhale for your balance issues also we discussed certain exercises that he can perform at home to keep your muscles as active as strong as possible presyncopal appointment with your primary care physician

## 2014-10-21 NOTE — ED Notes (Signed)
Pt complains of leg pain after drinking two beers, no injury

## 2014-11-09 ENCOUNTER — Telehealth: Payer: Self-pay | Admitting: Internal Medicine

## 2014-11-09 ENCOUNTER — Telehealth: Payer: Self-pay

## 2014-11-09 DIAGNOSIS — E139 Other specified diabetes mellitus without complications: Secondary | ICD-10-CM

## 2014-11-09 DIAGNOSIS — I10 Essential (primary) hypertension: Secondary | ICD-10-CM

## 2014-11-09 MED ORDER — METFORMIN HCL 1000 MG PO TABS: 1000.0000 mg | ORAL_TABLET | Freq: Two times a day (BID) | ORAL | Status: DC

## 2014-11-09 MED ORDER — GABAPENTIN 300 MG PO CAPS: 300.0000 mg | ORAL_CAPSULE | Freq: Three times a day (TID) | ORAL | Status: DC

## 2014-11-09 MED ORDER — LISINOPRIL 20 MG PO TABS: 10.0000 mg | ORAL_TABLET | Freq: Every day | ORAL | Status: DC

## 2014-11-09 NOTE — Telephone Encounter (Signed)
Patient called requesting medication refill for the following medications : metFORMIN (GLUCOPHAGE) 1000 MG tablet,lisinopril (PRINIVIL,ZESTRIL) 20 MG tablet,,gabapentin (NEURONTIN) 300 MG capsule. Please f/u with pt

## 2014-11-09 NOTE — Telephone Encounter (Signed)
Patient called requesting refills on his medications  Refills sent to pharmacy for gabapentin metformin and lisinopril

## 2014-11-20 ENCOUNTER — Ambulatory Visit: Payer: Medicaid Other | Admitting: Internal Medicine

## 2014-11-23 ENCOUNTER — Telehealth: Payer: Self-pay

## 2014-11-23 ENCOUNTER — Other Ambulatory Visit: Payer: Self-pay

## 2014-11-23 DIAGNOSIS — I10 Essential (primary) hypertension: Secondary | ICD-10-CM

## 2014-11-23 DIAGNOSIS — E139 Other specified diabetes mellitus without complications: Secondary | ICD-10-CM

## 2014-11-23 MED ORDER — METFORMIN HCL 1000 MG PO TABS: 1000.0000 mg | ORAL_TABLET | Freq: Two times a day (BID) | ORAL | Status: DC

## 2014-11-23 MED ORDER — LISINOPRIL 20 MG PO TABS: 10.0000 mg | ORAL_TABLET | Freq: Every day | ORAL | Status: DC

## 2014-11-23 MED ORDER — GABAPENTIN 300 MG PO CAPS: 300.0000 mg | ORAL_CAPSULE | Freq: Three times a day (TID) | ORAL | Status: DC

## 2014-11-23 NOTE — Telephone Encounter (Signed)
Patient called requesting refills on his metformin and gabapentin Prescriptions sent to pharmacy on file

## 2014-11-30 ENCOUNTER — Ambulatory Visit: Payer: Medicaid Other | Admitting: Internal Medicine

## 2014-12-04 ENCOUNTER — Encounter: Payer: Self-pay | Admitting: Internal Medicine

## 2014-12-04 ENCOUNTER — Ambulatory Visit: Payer: Medicaid Other | Attending: Internal Medicine | Admitting: Internal Medicine

## 2014-12-04 VITALS — BP 140/80 | HR 63 | Temp 98.0°F | Resp 16 | Wt 264.0 lb

## 2014-12-04 DIAGNOSIS — I10 Essential (primary) hypertension: Secondary | ICD-10-CM | POA: Insufficient documentation

## 2014-12-04 DIAGNOSIS — E139 Other specified diabetes mellitus without complications: Secondary | ICD-10-CM | POA: Diagnosis not present

## 2014-12-04 DIAGNOSIS — Z720 Tobacco use: Secondary | ICD-10-CM | POA: Diagnosis not present

## 2014-12-04 DIAGNOSIS — R531 Weakness: Secondary | ICD-10-CM | POA: Diagnosis not present

## 2014-12-04 DIAGNOSIS — F17200 Nicotine dependence, unspecified, uncomplicated: Secondary | ICD-10-CM

## 2014-12-04 LAB — GLUCOSE, POCT (MANUAL RESULT ENTRY): POC Glucose: 117 mg/dl — AB (ref 70–99)

## 2014-12-04 LAB — POCT GLYCOSYLATED HEMOGLOBIN (HGB A1C): Hemoglobin A1C: 7.2

## 2014-12-04 NOTE — Progress Notes (Signed)
MRN: [id number] Name: [name]  Sex: male Age: 54 y.o. DOB: 30-Sep-1960  Allergies: Review of patient's allergies indicates no known allergies.  Chief Complaint  Patient presents with  . Follow-up    HPI: Patient is 54 y.o. male who history of hypertension diabetes, history of left-sided weakness secondary to a spinal cord contusion, currently following her with physical therapy, currently denies any acute symptoms, initially his blood pressure was elevated, repeat manual blood pressure is140/80, patient is to smoke cigarettes, I have counseled patient to quit smoking, patient is in need of home health services,patient will meet our social worker.  Past Medical History  Diagnosis Date  . Diabetes mellitus   . Hypertension   . 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 Cocaine abuse   . Chronic pain   . Alcohol abuse   . Tobacco abuse   . Gait instability   . Chronic arm pain   . Chronic leg pain   . Peripheral edema     chronic LUE    Past Surgical History  Procedure Laterality Date  . Left arm skin graft    . Left heart catheterization with coronary angiogram Bilateral 12/26/2013    Procedure: LEFT HEART CATHETERIZATION WITH CORONARY ANGIOGRAM;  Surgeon: Burnell Blanks, MD;  Location: [facility] CATH LAB;  Service: Cardiovascular;  Laterality: Bilateral;  . Cardiac catheterization  12/27/2013  . Cardiac catheterization  12/27/2013    Impression: 1. 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 Moderate disease in the small caliber intermediate branch, 3. Normal LV systolic function      Medication List       This list is accurate as of: 12/04/14 12:16 PM.  Always use your most recent med list.               acetaminophen 325 MG tablet  Commonly known as:  TYLENOL  Take 2 tablets (650 mg total) by mouth every 4 (four) hours as needed for mild pain or fever.     aspirin 81 MG EC tablet  Take 1 tablet (81 mg total) by mouth daily.     baclofen 5  mg Tabs tablet  Commonly known as:  LIORESAL  Take 0.5 tablets (5 mg total) by mouth 3 (three) times daily as needed for muscle spasms.     gabapentin 300 MG capsule  Commonly known as:  NEURONTIN  Take 1 capsule (300 mg total) by mouth 3 (three) times daily.     glipiZIDE 10 MG tablet  Commonly known as:  GLUCOTROL  Take 1 tablet (10 mg total) by mouth 2 (two) times daily before a meal.     glucose monitoring kit monitoring kit  1 each by Does not apply route as needed for other. Dispense any model that is covered- dispense testing supplies for Q AC/ HS accuchecks- 1 month supply with one refil.     lisinopril 20 MG tablet  Commonly known as:  PRINIVIL,ZESTRIL  Take 0.5 tablets (10 mg total) by mouth daily.     metFORMIN 1000 MG tablet  Commonly known as:  GLUCOPHAGE  Take 1 tablet (1,000 mg total) by mouth 2 (two) times daily with a meal.     simvastatin 20 MG tablet  Commonly known as:  ZOCOR  Take 1 tablet (20 mg total) by mouth daily at 6 PM.     sulfamethoxazole-trimethoprim 800-160 MG per tablet  Commonly known as:  BACTRIM DS  Take 1  tablet by mouth 2 (two) times daily.     traMADol 50 MG tablet  Commonly known as:  ULTRAM  Take 1 tablet (50 mg total) by mouth every 12 (twelve) hours as needed for moderate pain or severe pain.        No orders of the defined types were placed in this encounter.    Immunization History  Administered Date(s) Administered  . Influenza,inj,Quad PF,36+ Mos 05/22/2014    Family History  Problem Relation Age of Onset  . Hypertension Mother   . Diabetes Mother   . Cancer Mother     breast cancer   . Hypertension Father   . Heart disease Father   . Stroke Father   . Hypertension Sister     History  Substance Use Topics  . Smoking status: Current Every Day Smoker -- 0.25 packs/day    Types: Cigarettes  . Smokeless tobacco: Never Used  . Alcohol Use: Yes     Comment: occassional    Review of Systems   As noted in  HPI  Filed Vitals:   12/04/14 1128  BP: 140/80  Pulse:   Temp:   Resp:     Physical Exam  Physical Exam  Constitutional: No distress.  Eyes: EOM are normal. Pupils are equal, round, and reactive to light.  Cardiovascular: Normal rate and regular rhythm.   Pulmonary/Chest: Breath sounds normal. No respiratory distress. He has no wheezes. He has no rales.  Musculoskeletal:  Weakness of left lower extremity, patient is using cane for walking    CBC    Component Value Date/Time   WBC 7.4 09/09/2014 1314   RBC 4.79 09/09/2014 1314   HGB 13.1 09/09/2014 1314   HCT 39.7 09/09/2014 1314   PLT 204 09/09/2014 1314   MCV 82.9 09/09/2014 1314   LYMPHSABS 2.0 09/09/2014 1314   MONOABS 0.7 09/09/2014 1314   EOSABS 0.2 09/09/2014 1314   BASOSABS 0.0 09/09/2014 1314    CMP     Component Value Date/Time   NA 137 09/09/2014 1314   K 4.1 09/09/2014 1314   CL 105 09/09/2014 1314   CO2 23 09/09/2014 1314   GLUCOSE 76 09/09/2014 1314   BUN 9 09/09/2014 1314   CREATININE 0.97 09/09/2014 1314   CALCIUM 9.1 09/09/2014 1314   PROT 6.7 09/09/2014 1314   ALBUMIN 3.8 09/09/2014 1314   AST 17 09/09/2014 1314   ALT 15 09/09/2014 1314   ALKPHOS 47 09/09/2014 1314   BILITOT 0.4 09/09/2014 1314   GFRNONAA >90 09/09/2014 1314   GFRAA >90 09/09/2014 1314    Lab Results  Component Value Date/Time   CHOL 204* 12/28/2013 01:40 PM    Lab Results  Component Value Date/Time   HGBA1C 7.2 12/04/2014 11:10 AM   HGBA1C 7.4* 12/28/2013 01:40 PM    Lab Results  Component Value Date/Time   AST 17 09/09/2014 01:14 PM    Assessment and Plan  Other specified diabetes mellitus without complications - Plan:  Results for orders placed or performed in visit on 12/04/14  Glucose (CBG)  Result Value Ref Range   POC Glucose 117 (A) 70 - 99 mg/dl  HgB A1c  Result Value Ref Range   Hemoglobin A1C 7.2    Hemoglobin A1c has trended down, advise patient to continue with diabetes meal planning,  continue with current meds, repeat A1c in 3 months.  Weakness left lower extremity Currently following up with physical therapy  Essential hypertension Blood pressure is borderline elevated, he'll continue  with current meds, repeat blood chemistry on the following visit.  Smoking Again counseled patient to quit smoking.   Return in about 3 months (around 03/06/2015).   This note has been created with Surveyor, quantity. Any transcriptional errors are unintentional.    Lorayne Marek, MD

## 2014-12-04 NOTE — Progress Notes (Signed)
ASSESSMENT: Pt currently experiencing motivation to consider quitting smoking, along with needing physical therapy and a walker. He needs to F/U with PCP, F/U with [facility] as needed. He would benefit from quitting smoking and advocating for himself in taking care of his health.  Stage of Change: contemplative  PLAN: 1. F/U with behavioral health consultant in as needed  2. Psychiatric Medications: none. 3. Behavioral recommendation(s):   -Consider calling [facility] to receive quit smoking services -Call Roselyn Reef in one week @[phone] if you do not hear back about receiving walker and/or physical therapy -Answer phone about walker and physical therapy referral calls SUBJECTIVE: Pt. referred by Dr Annitta Needs for community resources:  Pt. here for referral regarding community resources.  Pt. reports the following symptoms/concerns: Pt states that what he wants most is a walker, to get around better; also says that his physical therapy has stopped and he wants to start up again. Pt says that he is thinking about quitting smoking, and that he has cut back lately.  Duration of problem: >one month Severity: mild  OBJECTIVE: Orientation & Cognition: Oriented x3. Thought processes normal and appropriate to situation. Mood: appropriate. Affect: appropriate Appearance: appropriate Risk of harm to self or others: no risk of harm to self or others Substance use: alcohol Psychiatric medication use: Unchanged from prior contact. Assessments administered: none  Diagnosis: Tobacco use CPT Code: Z72.0 -------------------------------------------- Other(s) present in the room: friend  Time spent with patient in exam room: 16 minutes

## 2014-12-04 NOTE — Progress Notes (Signed)
Patient here for follow up on his diabetes and blood pressure Patient is requesting a referral for in service care and a prescription for a specialized walker

## 2014-12-04 NOTE — Patient Instructions (Signed)
Diabetes Mellitus and Food It is important for you to manage your blood sugar (glucose) level. Your blood glucose level can be greatly affected by what you eat. Eating healthier foods in the appropriate amounts throughout the day at about the same time each day will help you control your blood glucose level. It can also help slow or prevent worsening of your diabetes mellitus. Healthy eating may even help you improve the level of your blood pressure and reach or maintain a healthy weight.  HOW CAN FOOD AFFECT ME? Carbohydrates Carbohydrates affect your blood glucose level more than any other type of food. Your dietitian will help you determine how many carbohydrates to eat at each meal and teach you how to count carbohydrates. Counting carbohydrates is important to keep your blood glucose at a healthy level, especially if you are using insulin or taking certain medicines for diabetes mellitus. Alcohol Alcohol can cause sudden decreases in blood glucose (hypoglycemia), especially if you use insulin or take certain medicines for diabetes mellitus. Hypoglycemia can be a life-threatening condition. Symptoms of hypoglycemia (sleepiness, dizziness, and disorientation) are similar to symptoms of having too much alcohol.  If your health care provider has given you approval to drink alcohol, do so in moderation and use the following guidelines:  Women should not have more than one drink per day, and men should not have more than two drinks per day. One drink is equal to:  12 oz of beer.  5 oz of wine.  1 oz of hard liquor.  Do not drink on an empty stomach.  Keep yourself hydrated. Have water, diet soda, or unsweetened iced tea.  Regular soda, juice, and other mixers might contain a lot of carbohydrates and should be counted. WHAT FOODS ARE NOT RECOMMENDED? As you make food choices, it is important to remember that all foods are not the same. Some foods have fewer nutrients per serving than other  foods, even though they might have the same number of calories or carbohydrates. It is difficult to get your body what it needs when you eat foods with fewer nutrients. Examples of foods that you should avoid that are high in calories and carbohydrates but low in nutrients include:  Trans fats (most processed foods list trans fats on the Nutrition Facts label).  Regular soda.  Juice.  Candy.  Sweets, such as cake, pie, doughnuts, and cookies.  Fried foods. WHAT FOODS CAN I EAT? Have nutrient-rich foods, which will nourish your body and keep you healthy. The food you should eat also will depend on several factors, including:  The calories you need.  The medicines you take.  Your weight.  Your blood glucose level.  Your blood pressure level.  Your cholesterol level. You also should eat a variety of foods, including:  Protein, such as meat, poultry, fish, tofu, nuts, and seeds (lean animal proteins are best).  Fruits.  Vegetables.  Dairy products, such as milk, cheese, and yogurt (low fat is best).  Breads, grains, pasta, cereal, rice, and beans.  Fats such as olive oil, trans fat-free margarine, canola oil, avocado, and olives. DOES EVERYONE WITH DIABETES MELLITUS HAVE THE SAME MEAL PLAN? Because every person with diabetes mellitus is different, there is not one meal plan that works for everyone. It is very important that you meet with a dietitian who will help you create a meal plan that is just right for you. Document Released: 04/06/2005 Document Revised: 07/15/2013 Document Reviewed: 06/06/2013 Exit[employer] Patient Information 2015 [employer]. This   information is not intended to replace advice given to you by your health care provider. Make sure you discuss any questions you have with your health care provider. DASH Eating Plan DASH stands for "Dietary Approaches to Stop Hypertension." The DASH eating plan is a healthy eating plan that has been shown to reduce high  blood pressure (hypertension). Additional health benefits may include reducing the risk of type 2 diabetes mellitus, heart disease, and stroke. The DASH eating plan may also help with weight loss. WHAT DO I NEED TO KNOW ABOUT THE DASH EATING PLAN? For the DASH eating plan, you will follow these general guidelines:  Choose foods with a percent daily value for sodium of less than 5% (as listed on the food label).  Use salt-free seasonings or herbs instead of table salt or sea salt.  Check with your health care provider or pharmacist before using salt substitutes.  Eat lower-sodium products, often labeled as "lower sodium" or "no salt added."  Eat fresh foods.  Eat more vegetables, fruits, and low-fat dairy products.  Choose whole grains. Look for the word "whole" as the first word in the ingredient list.  Choose fish and skinless chicken or turkey more often than red meat. Limit fish, poultry, and meat to 6 oz (170 g) each day.  Limit sweets, desserts, sugars, and sugary drinks.  Choose heart-healthy fats.  Limit cheese to 1 oz (28 g) per day.  Eat more home-cooked food and less restaurant, buffet, and fast food.  Limit fried foods.  Cook foods using methods other than frying.  Limit canned vegetables. If you do use them, rinse them well to decrease the sodium.  When eating at a restaurant, ask that your food be prepared with less salt, or no salt if possible. WHAT FOODS CAN I EAT? Seek help from a dietitian for individual calorie needs. Grains Whole grain or whole wheat bread. Brown rice. Whole grain or whole wheat pasta. Quinoa, bulgur, and whole grain cereals. Low-sodium cereals. Corn or whole wheat flour tortillas. Whole grain cornbread. Whole grain crackers. Low-sodium crackers. Vegetables Fresh or frozen vegetables (raw, steamed, roasted, or grilled). Low-sodium or reduced-sodium tomato and vegetable juices. Low-sodium or reduced-sodium tomato sauce and paste. Low-sodium  or reduced-sodium canned vegetables.  Fruits All fresh, canned (in natural juice), or frozen fruits. Meat and Other Protein Products Ground beef (85% or leaner), grass-fed beef, or beef trimmed of fat. Skinless chicken or turkey. Ground chicken or turkey. Pork trimmed of fat. All fish and seafood. Eggs. Dried beans, peas, or lentils. Unsalted nuts and seeds. Unsalted canned beans. Dairy Low-fat dairy products, such as skim or 1% milk, 2% or reduced-fat cheeses, low-fat ricotta or cottage cheese, or plain low-fat yogurt. Low-sodium or reduced-sodium cheeses. Fats and Oils Tub margarines without trans fats. Light or reduced-fat mayonnaise and salad dressings (reduced sodium). Avocado. Safflower, olive, or canola oils. Natural peanut or almond butter. Other Unsalted popcorn and pretzels. The items listed above may not be a complete list of recommended foods or beverages. Contact your dietitian for more options. WHAT FOODS ARE NOT RECOMMENDED? Grains White bread. White pasta. White rice. Refined cornbread. Bagels and croissants. Crackers that contain trans fat. Vegetables Creamed or fried vegetables. Vegetables in a cheese sauce. Regular canned vegetables. Regular canned tomato sauce and paste. Regular tomato and vegetable juices. Fruits Dried fruits. Canned fruit in light or heavy syrup. Fruit juice. Meat and Other Protein Products Fatty cuts of meat. Ribs, chicken wings, bacon, sausage, bologna, salami, chitterlings, fatback, hot   dogs, bratwurst, and packaged luncheon meats. Salted nuts and seeds. Canned beans with salt. Dairy Whole or 2% milk, cream, half-and-half, and cream cheese. Whole-fat or sweetened yogurt. Full-fat cheeses or blue cheese. Nondairy creamers and whipped toppings. Processed cheese, cheese spreads, or cheese curds. Condiments Onion and garlic salt, seasoned salt, table salt, and sea salt. Canned and packaged gravies. Worcestershire sauce. Tartar sauce. Barbecue sauce.  Teriyaki sauce. Soy sauce, including reduced sodium. Steak sauce. Fish sauce. Oyster sauce. Cocktail sauce. Horseradish. Ketchup and mustard. Meat flavorings and tenderizers. Bouillon cubes. Hot sauce. Tabasco sauce. Marinades. Taco seasonings. Relishes. Fats and Oils Butter, stick margarine, lard, shortening, ghee, and bacon fat. Coconut, palm kernel, or palm oils. Regular salad dressings. Other Pickles and olives. Salted popcorn and pretzels. The items listed above may not be a complete list of foods and beverages to avoid. Contact your dietitian for more information. WHERE CAN I FIND MORE INFORMATION? National Heart, Lung, and [employer] Institute: www.nhlbi.nih.gov/health/health-topics/topics/dash/ Document Released: 06/29/2011 Document Revised: 11/24/2013 Document Reviewed: 05/14/2013 ExitCare Patient Information 2015 [employer]. This information is not intended to replace advice given to you by your health care provider. Make sure you discuss any questions you have with your health care provider.  

## 2014-12-09 ENCOUNTER — Telehealth: Payer: Self-pay | Admitting: Clinical

## 2014-12-09 ENCOUNTER — Other Ambulatory Visit: Payer: Self-pay

## 2014-12-09 DIAGNOSIS — E669 Obesity, unspecified: Secondary | ICD-10-CM

## 2014-12-09 NOTE — Telephone Encounter (Signed)
Completed call, see above

## 2014-12-11 ENCOUNTER — Telehealth: Payer: Self-pay | Admitting: Clinical

## 2014-12-11 NOTE — Telephone Encounter (Signed)
Call complete, see above

## 2015-01-04 ENCOUNTER — Ambulatory Visit: Payer: Medicaid Other | Admitting: Podiatry

## 2015-01-11 ENCOUNTER — Other Ambulatory Visit: Payer: Self-pay

## 2015-01-11 ENCOUNTER — Telehealth: Payer: Self-pay | Admitting: Clinical

## 2015-01-11 DIAGNOSIS — S14109A Unspecified injury at unspecified level of cervical spinal cord, initial encounter: Secondary | ICD-10-CM

## 2015-01-11 NOTE — Telephone Encounter (Signed)
Pt states he wants another referral to physical therapy; says that Medicaid had incorrect information last time, but says that Medicaid will now pay for the physical therapy

## 2015-01-13 ENCOUNTER — Telehealth: Payer: Self-pay | Admitting: Internal Medicine

## 2015-01-13 NOTE — Telephone Encounter (Signed)
Glenda called requesting status of form, was informed process can take 14 days to process

## 2015-01-19 ENCOUNTER — Telehealth: Payer: Self-pay | Admitting: Clinical

## 2015-01-19 ENCOUNTER — Ambulatory Visit: Payer: Medicaid Other | Admitting: Podiatry

## 2015-01-19 NOTE — Telephone Encounter (Signed)
Pt wants to know about his physical therapy referral; [facility] let pt know that the referral has already been sent to [facility], and that he will receive a phone call when the appointment is set up for him.

## 2015-01-29 ENCOUNTER — Telehealth: Payer: Self-pay | Admitting: Clinical

## 2015-01-29 NOTE — Telephone Encounter (Signed)
Informed pt that his referral to opthalmology has been sent, but that he will need to schedule an office visit prior to obtaining a referral to the audiologist, as requested. Pt states that Medicaid will only cover one visit for physical therapy, and that he has no visits left.

## 2015-02-03 ENCOUNTER — Ambulatory Visit (INDEPENDENT_AMBULATORY_CARE_PROVIDER_SITE_OTHER): Payer: Medicaid Other | Admitting: Podiatry

## 2015-02-03 ENCOUNTER — Encounter: Payer: Self-pay | Admitting: Podiatry

## 2015-02-03 DIAGNOSIS — M79675 Pain in left toe(s): Secondary | ICD-10-CM | POA: Diagnosis not present

## 2015-02-03 DIAGNOSIS — M79676 Pain in unspecified toe(s): Secondary | ICD-10-CM

## 2015-02-03 DIAGNOSIS — Q828 Other specified congenital malformations of skin: Secondary | ICD-10-CM

## 2015-02-03 DIAGNOSIS — B351 Tinea unguium: Secondary | ICD-10-CM

## 2015-02-03 DIAGNOSIS — E119 Type 2 diabetes mellitus without complications: Secondary | ICD-10-CM

## 2015-02-03 DIAGNOSIS — M79674 Pain in right toe(s): Secondary | ICD-10-CM

## 2015-02-03 DIAGNOSIS — L84 Corns and callosities: Secondary | ICD-10-CM

## 2015-02-03 NOTE — Patient Instructions (Signed)
Diabetes and Foot Care Diabetes may cause you to have problems because of poor blood supply (circulation) to your feet and legs. This may cause the skin on your feet to become thinner, break easier, and heal more slowly. Your skin may become dry, and the skin may peel and crack. You may also have nerve damage in your legs and feet causing decreased feeling in them. You may not notice minor injuries to your feet that could lead to infections or more serious problems. Taking care of your feet is one of the most important things you can do for yourself.  HOME CARE INSTRUCTIONS  Wear shoes at all times, even in the house. Do not go barefoot. Bare feet are easily injured.  Check your feet daily for blisters, cuts, and redness. If you cannot see the bottom of your feet, use a mirror or ask someone for help.  Wash your feet with warm water (do not use hot water) and mild soap. Then pat your feet and the areas between your toes until they are completely dry. Do not soak your feet as this can dry your skin.  Apply a moisturizing lotion or petroleum jelly (that does not contain alcohol and is unscented) to the skin on your feet and to dry, brittle toenails. Do not apply lotion between your toes.  Trim your toenails straight across. Do not dig under them or around the cuticle. File the edges of your nails with an emery board or nail file.  Do not cut corns or calluses or try to remove them with medicine.  Wear clean socks or stockings every day. Make sure they are not too tight. Do not wear knee-high stockings since they may decrease blood flow to your legs.  Wear shoes that fit properly and have enough cushioning. To break in new shoes, wear them for just a few hours a day. This prevents you from injuring your feet. Always look in your shoes before you put them on to be sure there are no objects inside.  Do not cross your legs. This may decrease the blood flow to your feet.  If you find a minor scrape,  cut, or break in the skin on your feet, keep it and the skin around it clean and dry. These areas may be cleansed with mild soap and water. Do not cleanse the area with peroxide, alcohol, or iodine.  When you remove an adhesive bandage, be sure not to damage the skin around it.  If you have a wound, look at it several times a day to make sure it is healing.  Do not use heating pads or hot water bottles. They may burn your skin. If you have lost feeling in your feet or legs, you may not know it is happening until it is too late.  Make sure your health care provider performs a complete foot exam at least annually or more often if you have foot problems. Report any cuts, sores, or bruises to your health care provider immediately. SEEK MEDICAL CARE IF:   You have an injury that is not healing.  You have cuts or breaks in the skin.  You have an ingrown nail.  You notice redness on your legs or feet.  You feel burning or tingling in your legs or feet.  You have pain or cramps in your legs and feet.  Your legs or feet are numb.  Your feet always feel cold. SEEK IMMEDIATE MEDICAL CARE IF:   There is increasing redness,   swelling, or pain in or around a wound.  There is a red line that goes up your leg.  Pus is coming from a wound.  You develop a fever or as directed by your health care provider.  You notice a bad smell coming from an ulcer or wound. Document Released: 07/07/2000 Document Revised: 03/12/2013 Document Reviewed: 12/17/2012 ExitCare Patient Information 2015 [employer]. This information is not intended to replace advice given to you by your health care provider. Make sure you discuss any questions you have with your health care provider.  

## 2015-02-03 NOTE — Progress Notes (Signed)
Patient ID: [name], male   DOB: November 09, 1960, 54 y.o.   MRN: [id number]  Subjective: This patient presents for scheduled visit complaining of painful toenails and a painful plantar callus  Objective: The toenails are elongated, hypertrophic, discolored, incurvated and tender to direct palpation 6-10 Large well-organized callus plantar left hallux  Assessment: Symptomatic onychomycoses 6-10 Plantar keratoses 1 Diabetic without complication  Plan: Debridement of toenails 10 mechanical electrically without any bleeding Debrided plantar keratoses 1  Reappoint 3 months

## 2015-02-05 NOTE — Telephone Encounter (Signed)
No message left

## 2015-02-10 ENCOUNTER — Ambulatory Visit: Payer: Medicaid Other

## 2015-03-01 ENCOUNTER — Other Ambulatory Visit: Payer: Self-pay

## 2015-03-04 ENCOUNTER — Other Ambulatory Visit: Payer: Self-pay | Admitting: Internal Medicine

## 2015-03-04 DIAGNOSIS — E1165 Type 2 diabetes mellitus with hyperglycemia: Secondary | ICD-10-CM

## 2015-03-12 ENCOUNTER — Telehealth: Payer: Self-pay | Admitting: Clinical

## 2015-03-12 NOTE — Telephone Encounter (Signed)
Pt inquiring about referral to physical therapy; pt advised that he will need to make an appointment for referral with PCP, pt agrees.

## 2015-03-15 ENCOUNTER — Ambulatory Visit: Payer: Medicaid Other | Attending: Family Medicine | Admitting: Family Medicine

## 2015-03-15 ENCOUNTER — Encounter: Payer: Self-pay | Admitting: Family Medicine

## 2015-03-15 VITALS — BP 166/91 | HR 70 | Temp 98.1°F | Resp 18 | Ht 75.0 in | Wt 260.0 lb

## 2015-03-15 DIAGNOSIS — L729 Follicular cyst of the skin and subcutaneous tissue, unspecified: Secondary | ICD-10-CM | POA: Diagnosis not present

## 2015-03-15 DIAGNOSIS — E785 Hyperlipidemia, unspecified: Secondary | ICD-10-CM | POA: Diagnosis not present

## 2015-03-15 DIAGNOSIS — Z114 Encounter for screening for human immunodeficiency virus [HIV]: Secondary | ICD-10-CM | POA: Insufficient documentation

## 2015-03-15 DIAGNOSIS — E1169 Type 2 diabetes mellitus with other specified complication: Secondary | ICD-10-CM

## 2015-03-15 DIAGNOSIS — H9193 Unspecified hearing loss, bilateral: Secondary | ICD-10-CM | POA: Insufficient documentation

## 2015-03-15 DIAGNOSIS — E118 Type 2 diabetes mellitus with unspecified complications: Secondary | ICD-10-CM

## 2015-03-15 DIAGNOSIS — E119 Type 2 diabetes mellitus without complications: Secondary | ICD-10-CM

## 2015-03-15 DIAGNOSIS — L0889 Other specified local infections of the skin and subcutaneous tissue: Secondary | ICD-10-CM | POA: Diagnosis not present

## 2015-03-15 DIAGNOSIS — R809 Proteinuria, unspecified: Secondary | ICD-10-CM | POA: Diagnosis not present

## 2015-03-15 DIAGNOSIS — Z1159 Encounter for screening for other viral diseases: Secondary | ICD-10-CM | POA: Insufficient documentation

## 2015-03-15 DIAGNOSIS — IMO0001 Reserved for inherently not codable concepts without codable children: Secondary | ICD-10-CM

## 2015-03-15 DIAGNOSIS — L089 Local infection of the skin and subcutaneous tissue, unspecified: Secondary | ICD-10-CM

## 2015-03-15 DIAGNOSIS — E139 Other specified diabetes mellitus without complications: Secondary | ICD-10-CM | POA: Diagnosis not present

## 2015-03-15 DIAGNOSIS — I10 Essential (primary) hypertension: Secondary | ICD-10-CM | POA: Diagnosis not present

## 2015-03-15 DIAGNOSIS — E1165 Type 2 diabetes mellitus with hyperglycemia: Secondary | ICD-10-CM

## 2015-03-15 DIAGNOSIS — B353 Tinea pedis: Secondary | ICD-10-CM

## 2015-03-15 LAB — GLUCOSE, POCT (MANUAL RESULT ENTRY): POC Glucose: 121 mg/dl — AB (ref 70–99)

## 2015-03-15 LAB — LIPID PANEL
CHOL/HDL RATIO: 4 ratio (ref <=5.0)
Cholesterol: 222 mg/dL — ABNORMAL HIGH (ref 125–200)
HDL: 55 mg/dL (ref >=40)
LDL CALC: 150 mg/dL — AB (ref <130)
Triglycerides: 85 mg/dL (ref <150)
VLDL: 17 mg/dL (ref <30)

## 2015-03-15 LAB — POCT GLYCOSYLATED HEMOGLOBIN (HGB A1C): Hemoglobin A1C: 8

## 2015-03-15 MED ORDER — LISINOPRIL 20 MG PO TABS: 10.0000 mg | ORAL_TABLET | Freq: Every day | ORAL | Status: DC

## 2015-03-15 MED ORDER — CARBAMIDE PEROXIDE 6.5 % OT SOLN: 5.0000 [drp] | Freq: Two times a day (BID) | OTIC | Status: DC

## 2015-03-15 MED ORDER — TERBINAFINE HCL 1 % EX CREA: 1.0000 "application " | TOPICAL_CREAM | Freq: Two times a day (BID) | CUTANEOUS | Status: DC

## 2015-03-15 MED ORDER — DOXYCYCLINE HYCLATE 100 MG PO TABS: 100.0000 mg | ORAL_TABLET | Freq: Two times a day (BID) | ORAL | Status: DC

## 2015-03-15 MED ORDER — METFORMIN HCL 1000 MG PO TABS: 1000.0000 mg | ORAL_TABLET | Freq: Two times a day (BID) | ORAL | Status: DC

## 2015-03-15 MED ORDER — SIMVASTATIN 20 MG PO TABS: 20.0000 mg | ORAL_TABLET | Freq: Every day | ORAL | Status: DC

## 2015-03-15 MED ORDER — GLIPIZIDE 10 MG PO TABS: 10.0000 mg | ORAL_TABLET | Freq: Two times a day (BID) | ORAL | Status: DC

## 2015-03-15 NOTE — Progress Notes (Signed)
Establish Care with New PCP F/U DM  Bumps on head, discharge no pain

## 2015-03-15 NOTE — Progress Notes (Signed)
   Subjective:    Patient ID: [name], male    DOB: 08/29/1960, 54 y.o.   MRN: [id number]  HPI  1. CHRONIC DIABETES  Disease Monitoring  Blood Sugar Ranges: not checking   Polyuria: no   Visual problems: yes, cannot see well out of both eyes.    Medication Compliance: yes  Medication Side Effects  Hypoglycemia: no   Preventitive Health Care  Eye Exam: due   Foot Exam: done today    2. Rash on face: on scalp and face for many months. Swells and drains at times. Drains white fluid. Patient cuts his own hair. Has history of abscess in R posterior scalp.   3. Decreased hearing: both ears. For many months. Not hearing the television as well. Lives alone. No ear pain or fever.    4. HTN: taking lisinopril 20 mg daily. Did not take medication today as he is fasting. No HA, CP, SOB. Reports chronic poor vision.    Social History  Substance Use Topics  . Smoking status: Current Every Day Smoker -- 0.25 packs/day    Types: Cigarettes  . Smokeless tobacco: Never Used  . Alcohol Use: Yes     Comment: occassional   Review of Systems  Constitutional: Negative for fever, chills, fatigue and unexpected weight change.  HENT: Positive for hearing loss.   Eyes: Positive for visual disturbance.  Respiratory: Negative for cough and shortness of breath.   Cardiovascular: Negative for chest pain, palpitations and leg swelling.  Gastrointestinal: Negative for nausea, vomiting, abdominal pain, diarrhea, constipation and blood in stool.  Endocrine: Negative for polydipsia, polyphagia and polyuria.  Musculoskeletal: Negative for myalgias, back pain, arthralgias, gait problem and neck pain.  Skin: Positive for rash.       On scalp   Allergic/Immunologic: Negative for immunocompromised state.  Neurological: Positive for weakness and numbness.  Hematological: Negative for adenopathy. Does not bruise/bleed easily.  Psychiatric/Behavioral: Negative for suicidal ideas, sleep disturbance and  dysphoric mood. The patient is not nervous/anxious.       Objective:   Physical Exam  Constitutional: He appears well-developed and well-nourished. No distress.  HENT:  Head:    Cerumen in both ears   Eyes: Conjunctivae are normal. Pupils are equal, round, and reactive to light.  Neck: Normal range of motion. Neck supple.  Cardiovascular: Normal rate, regular rhythm, normal heart sounds and intact distal pulses.   Pulmonary/Chest: Effort normal and breath sounds normal.  Musculoskeletal: He exhibits no edema.  Neurological: He is alert.  Skin: Skin is warm and dry. Rash noted. No erythema.  Hyperpigmentation and scaling noted on feet   Psychiatric: He has a normal mood and affect.  BP 166/91 mmHg  Pulse 70  Temp(Src) 98.1 F (36.7 C) (Oral)  Resp 18  Ht 6\' 3"  (1.905 m)  Wt 260 lb (117.935 kg)  BMI 32.50 kg/m2  SpO2 98%   Lab Results  Component Value Date   HGBA1C 7.2 12/04/2014   CBG 121  Lab Results  Component Value Date   HGBA1C 8.0 03/15/2015          Assessment & Plan:

## 2015-03-15 NOTE — Patient Instructions (Signed)
[name],  1. Diabetes: Referral to opthalmology meds refilled  2. HTN: Continue lisinopril 10 mg daily  3. Hearing loss: Audiology referral [facility]   4. Foot rash: lamisil   F/u in 3 weeks with RN for BP and ear flush  F/u with me in 2 months   Dr. Adrian Blackwater

## 2015-03-16 ENCOUNTER — Encounter: Payer: Self-pay | Admitting: Family Medicine

## 2015-03-16 DIAGNOSIS — E119 Type 2 diabetes mellitus without complications: Secondary | ICD-10-CM | POA: Insufficient documentation

## 2015-03-16 DIAGNOSIS — E118 Type 2 diabetes mellitus with unspecified complications: Secondary | ICD-10-CM | POA: Insufficient documentation

## 2015-03-16 LAB — MICROALBUMIN / CREATININE URINE RATIO
Creatinine, Urine: 154.9 mg/dL
MICROALB/CREAT RATIO: 358.9 mg/g — AB (ref 0.0–30.0)
Microalb, Ur: 55.6 mg/dL — ABNORMAL HIGH (ref <2.0)

## 2015-03-16 LAB — HEPATITIS C ANTIBODY: HCV Ab: NEGATIVE

## 2015-03-16 LAB — HIV ANTIBODY (ROUTINE TESTING W REFLEX): HIV: NONREACTIVE

## 2015-03-16 MED ORDER — ATORVASTATIN CALCIUM 40 MG PO TABS: 40.0000 mg | ORAL_TABLET | Freq: Every day | ORAL | Status: DC

## 2015-03-16 MED ORDER — LISINOPRIL 20 MG PO TABS: 20.0000 mg | ORAL_TABLET | Freq: Every day | ORAL | Status: DC

## 2015-03-16 NOTE — Assessment & Plan Note (Signed)
lamisil ordered  

## 2015-03-16 NOTE — Assessment & Plan Note (Signed)
A; subjective decreased hearing P: [facility] for wax audiology referral

## 2015-03-16 NOTE — Assessment & Plan Note (Signed)
A; declined with elevated BP P: increase lisinopril to 20 mg daily

## 2015-03-16 NOTE — Assessment & Plan Note (Signed)
Diabetes: declined with elevated A1c  Referral to opthalmology meds refilled

## 2015-03-16 NOTE — Assessment & Plan Note (Signed)
Patient with elevated urine microalbumin in range of clinical albuminuria  Increase lisinopril to 20 mg daily for increased renal protection with plan to increase to 40 mg as tolerated at next visit

## 2015-03-16 NOTE — Assessment & Plan Note (Signed)
A: pustules and papules on scalp P: treat with doxy

## 2015-03-18 ENCOUNTER — Telehealth: Payer: Self-pay | Admitting: Family Medicine

## 2015-03-18 NOTE — Telephone Encounter (Signed)
Case Manage from [facility] called for clarity about patients referral for addition physical therapy.

## 2015-03-24 NOTE — Telephone Encounter (Signed)
Called back to # provided. Voicemail box was not set up, unable to leave VM

## 2015-03-26 ENCOUNTER — Other Ambulatory Visit: Payer: Self-pay | Admitting: Family Medicine

## 2015-03-31 ENCOUNTER — Other Ambulatory Visit: Payer: Self-pay | Admitting: Internal Medicine

## 2015-04-06 ENCOUNTER — Telehealth: Payer: Self-pay | Admitting: Clinical

## 2015-04-06 NOTE — Telephone Encounter (Signed)
Pt says he wants a prescription for a shower chair.

## 2015-04-09 ENCOUNTER — Other Ambulatory Visit: Payer: Self-pay | Admitting: Family Medicine

## 2015-04-21 ENCOUNTER — Encounter: Payer: Self-pay | Admitting: Family Medicine

## 2015-04-21 NOTE — Telephone Encounter (Signed)
No voice mail set up Unable to contact Case manage

## 2015-04-26 ENCOUNTER — Observation Stay (HOSPITAL_COMMUNITY): Payer: Medicaid Other

## 2015-04-26 ENCOUNTER — Encounter (HOSPITAL_COMMUNITY): Payer: Self-pay | Admitting: Cardiology

## 2015-04-26 ENCOUNTER — Emergency Department (HOSPITAL_COMMUNITY): Payer: Medicaid Other

## 2015-04-26 ENCOUNTER — Observation Stay (HOSPITAL_COMMUNITY)
Admission: EM | Admit: 2015-04-26 | Discharge: 2015-04-27 | Disposition: A | Discharge status: Home / Self Care | Payer: Medicaid Other | Attending: Internal Medicine | Admitting: Internal Medicine

## 2015-04-26 DIAGNOSIS — G8929 Other chronic pain: Secondary | ICD-10-CM | POA: Diagnosis not present

## 2015-04-26 DIAGNOSIS — Z7982 Long term (current) use of aspirin: Secondary | ICD-10-CM | POA: Diagnosis not present

## 2015-04-26 DIAGNOSIS — R609 Edema, unspecified: Secondary | ICD-10-CM | POA: Diagnosis present

## 2015-04-26 DIAGNOSIS — I2510 Atherosclerotic heart disease of native coronary artery without angina pectoris: Secondary | ICD-10-CM | POA: Diagnosis not present

## 2015-04-26 DIAGNOSIS — K5901 Slow transit constipation: Secondary | ICD-10-CM

## 2015-04-26 DIAGNOSIS — Z792 Long term (current) use of antibiotics: Secondary | ICD-10-CM | POA: Insufficient documentation

## 2015-04-26 DIAGNOSIS — Z87828 Personal history of other (healed) physical injury and trauma: Secondary | ICD-10-CM | POA: Diagnosis not present

## 2015-04-26 DIAGNOSIS — E1142 Type 2 diabetes mellitus with diabetic polyneuropathy: Secondary | ICD-10-CM | POA: Insufficient documentation

## 2015-04-26 DIAGNOSIS — R2681 Unsteadiness on feet: Secondary | ICD-10-CM | POA: Diagnosis not present

## 2015-04-26 DIAGNOSIS — E1169 Type 2 diabetes mellitus with other specified complication: Secondary | ICD-10-CM | POA: Diagnosis present

## 2015-04-26 DIAGNOSIS — K5900 Constipation, unspecified: Secondary | ICD-10-CM | POA: Diagnosis present

## 2015-04-26 DIAGNOSIS — I10 Essential (primary) hypertension: Secondary | ICD-10-CM | POA: Diagnosis present

## 2015-04-26 DIAGNOSIS — R531 Weakness: Principal | ICD-10-CM | POA: Diagnosis present

## 2015-04-26 DIAGNOSIS — Z0000 Encounter for general adult medical examination without abnormal findings: Secondary | ICD-10-CM

## 2015-04-26 DIAGNOSIS — Z79899 Other long term (current) drug therapy: Secondary | ICD-10-CM | POA: Insufficient documentation

## 2015-04-26 DIAGNOSIS — E1165 Type 2 diabetes mellitus with hyperglycemia: Secondary | ICD-10-CM

## 2015-04-26 DIAGNOSIS — G629 Polyneuropathy, unspecified: Secondary | ICD-10-CM

## 2015-04-26 DIAGNOSIS — E785 Hyperlipidemia, unspecified: Secondary | ICD-10-CM

## 2015-04-26 DIAGNOSIS — E1100 Type 2 diabetes mellitus with hyperosmolarity without nonketotic hyperglycemic-hyperosmolar coma (NKHHC): Secondary | ICD-10-CM

## 2015-04-26 DIAGNOSIS — Z7984 Long term (current) use of oral hypoglycemic drugs: Secondary | ICD-10-CM | POA: Insufficient documentation

## 2015-04-26 DIAGNOSIS — M6289 Other specified disorders of muscle: Secondary | ICD-10-CM | POA: Diagnosis not present

## 2015-04-26 DIAGNOSIS — I639 Cerebral infarction, unspecified: Secondary | ICD-10-CM

## 2015-04-26 DIAGNOSIS — E119 Type 2 diabetes mellitus without complications: Secondary | ICD-10-CM

## 2015-04-26 DIAGNOSIS — R29898 Other symptoms and signs involving the musculoskeletal system: Secondary | ICD-10-CM

## 2015-04-26 DIAGNOSIS — F17200 Nicotine dependence, unspecified, uncomplicated: Secondary | ICD-10-CM | POA: Diagnosis not present

## 2015-04-26 DIAGNOSIS — L84 Corns and callosities: Secondary | ICD-10-CM | POA: Diagnosis not present

## 2015-04-26 LAB — CBC
HEMATOCRIT: 41.5 % (ref 39.0–52.0)
HEMOGLOBIN: 14 g/dL (ref 13.0–17.0)
MCH: 28.1 pg (ref 26.0–34.0)
MCHC: 33.7 g/dL (ref 30.0–36.0)
MCV: 83.2 fL (ref 78.0–100.0)
Platelets: 203 10*3/uL (ref 150–400)
RBC: 4.99 MIL/uL (ref 4.22–5.81)
RDW: 14.1 % (ref 11.5–15.5)
WBC: 12.3 10*3/uL — ABNORMAL HIGH (ref 4.0–10.5)

## 2015-04-26 LAB — RAPID URINE DRUG SCREEN, HOSP PERFORMED
Amphetamines: NOT DETECTED
BARBITURATES: NOT DETECTED
Benzodiazepines: NOT DETECTED
Cocaine: POSITIVE — AB
Opiates: NOT DETECTED
Tetrahydrocannabinol: NOT DETECTED

## 2015-04-26 LAB — URINALYSIS, ROUTINE W REFLEX MICROSCOPIC
Bilirubin Urine: NEGATIVE
Glucose, UA: NEGATIVE mg/dL
Hgb urine dipstick: NEGATIVE
KETONES UR: NEGATIVE mg/dL
LEUKOCYTES UA: NEGATIVE
NITRITE: NEGATIVE
PROTEIN: NEGATIVE mg/dL
Specific Gravity, Urine: 1.006 (ref 1.005–1.030)
UROBILINOGEN UA: 0.2 mg/dL (ref 0.0–1.0)
pH: 5 (ref 5.0–8.0)

## 2015-04-26 LAB — PROTIME-INR
INR: 1.1 (ref 0.00–1.49)
Prothrombin Time: 14.4 seconds (ref 11.6–15.2)

## 2015-04-26 LAB — COMPREHENSIVE METABOLIC PANEL
ALT: 18 U/L (ref 17–63)
AST: 19 U/L (ref 15–41)
Albumin: 3.9 g/dL (ref 3.5–5.0)
Alkaline Phosphatase: 54 U/L (ref 38–126)
Anion gap: 10 (ref 5–15)
BILIRUBIN TOTAL: 0.9 mg/dL (ref 0.3–1.2)
BUN: 18 mg/dL (ref 6–20)
CALCIUM: 9.3 mg/dL (ref 8.9–10.3)
CO2: 21 mmol/L — ABNORMAL LOW (ref 22–32)
CREATININE: 1.29 mg/dL — AB (ref 0.61–1.24)
Chloride: 102 mmol/L (ref 101–111)
GFR calc Af Amer: >60 mL/min (ref >60)
Glucose, Bld: 126 mg/dL — ABNORMAL HIGH (ref 65–99)
POTASSIUM: 4.8 mmol/L (ref 3.5–5.1)
Sodium: 133 mmol/L — ABNORMAL LOW (ref 135–145)
TOTAL PROTEIN: 7.1 g/dL (ref 6.5–8.1)

## 2015-04-26 LAB — I-STAT CHEM 8, ED
BUN: 22 mg/dL — ABNORMAL HIGH (ref 6–20)
CALCIUM ION: 1.14 mmol/L (ref 1.12–1.23)
CREATININE: 1.3 mg/dL — AB (ref 0.61–1.24)
Chloride: 101 mmol/L (ref 101–111)
Glucose, Bld: 128 mg/dL — ABNORMAL HIGH (ref 65–99)
HCT: 47 % (ref 39.0–52.0)
HEMOGLOBIN: 16 g/dL (ref 13.0–17.0)
Potassium: 4.7 mmol/L (ref 3.5–5.1)
Sodium: 135 mmol/L (ref 135–145)
TCO2: 23 mmol/L (ref 0–100)

## 2015-04-26 LAB — DIFFERENTIAL
BASOS ABS: 0.1 10*3/uL (ref 0.0–0.1)
Basophils Relative: 1 %
EOS ABS: 0.2 10*3/uL (ref 0.0–0.7)
Eosinophils Relative: 2 %
LYMPHS ABS: 3.1 10*3/uL (ref 0.7–4.0)
Lymphocytes Relative: 25 %
MONOS PCT: 13 %
Monocytes Absolute: 1.6 10*3/uL — ABNORMAL HIGH (ref 0.1–1.0)
Neutro Abs: 7.4 10*3/uL (ref 1.7–7.7)
Neutrophils Relative %: 59 %

## 2015-04-26 LAB — I-STAT TROPONIN, ED: TROPONIN I, POC: 0.01 ng/mL (ref 0.00–0.08)

## 2015-04-26 LAB — TSH: TSH: 1.665 u[IU]/mL (ref 0.350–4.500)

## 2015-04-26 LAB — ETHANOL: Alcohol, Ethyl (B): <5 mg/dL (ref <5)

## 2015-04-26 LAB — APTT: APTT: 30 s (ref 24–37)

## 2015-04-26 LAB — VITAMIN B12: Vitamin B-12: 277 pg/mL (ref 180–914)

## 2015-04-26 MED ORDER — ATORVASTATIN CALCIUM 40 MG PO TABS
40.0000 mg | ORAL_TABLET | Freq: Every day | ORAL | Status: DC
  Administered 2015-04-27: 40 mg via ORAL
  Filled 2015-04-26: qty 1

## 2015-04-26 MED ORDER — ACETAMINOPHEN 325 MG PO TABS: 650.0000 mg | ORAL_TABLET | Freq: Four times a day (QID) | ORAL | Status: DC | PRN

## 2015-04-26 MED ORDER — SODIUM CHLORIDE 0.9 % IJ SOLN: 3.0000 mL | Freq: Two times a day (BID) | INTRAMUSCULAR | Status: DC

## 2015-04-26 MED ORDER — ACETAMINOPHEN 650 MG RE SUPP: 650.0000 mg | Freq: Four times a day (QID) | RECTAL | Status: DC | PRN

## 2015-04-26 MED ORDER — GABAPENTIN 300 MG PO CAPS
300.0000 mg | ORAL_CAPSULE | Freq: Three times a day (TID) | ORAL | Status: DC
  Administered 2015-04-26 – 2015-04-27 (×4): 300 mg via ORAL
  Filled 2015-04-26 (×4): qty 1

## 2015-04-26 MED ORDER — IOHEXOL 350 MG/ML SOLN
100.0000 mL | Freq: Once | INTRAVENOUS | Status: AC | PRN
  Administered 2015-04-26: 100 mL via INTRAVENOUS

## 2015-04-26 MED ORDER — DOCUSATE SODIUM 100 MG PO CAPS
100.0000 mg | ORAL_CAPSULE | Freq: Two times a day (BID) | ORAL | Status: DC
  Administered 2015-04-26 – 2015-04-27 (×3): 100 mg via ORAL
  Filled 2015-04-26 (×3): qty 1

## 2015-04-26 MED ORDER — HYDROCODONE-ACETAMINOPHEN 5-325 MG PO TABS
1.0000 | ORAL_TABLET | ORAL | Status: DC | PRN
  Administered 2015-04-27: 2 via ORAL
  Filled 2015-04-26: qty 2

## 2015-04-26 MED ORDER — ENOXAPARIN SODIUM 40 MG/0.4ML ~~LOC~~ SOLN
40.0000 mg | SUBCUTANEOUS | Status: DC
  Administered 2015-04-26: 40 mg via SUBCUTANEOUS
  Filled 2015-04-26: qty 0.4

## 2015-04-26 MED ORDER — ONDANSETRON HCL 4 MG/2ML IJ SOLN: 4.0000 mg | Freq: Four times a day (QID) | INTRAMUSCULAR | Status: DC | PRN

## 2015-04-26 MED ORDER — GLIPIZIDE 5 MG PO TABS
10.0000 mg | ORAL_TABLET | Freq: Two times a day (BID) | ORAL | Status: DC
  Administered 2015-04-26 – 2015-04-27 (×3): 10 mg via ORAL
  Filled 2015-04-26 (×2): qty 1
  Filled 2015-04-26 (×2): qty 2

## 2015-04-26 MED ORDER — SODIUM CHLORIDE 0.9 % IV SOLN
INTRAVENOUS | Status: DC
  Administered 2015-04-26: 19:00:00 via INTRAVENOUS

## 2015-04-26 MED ORDER — ONDANSETRON HCL 4 MG PO TABS: 4.0000 mg | ORAL_TABLET | Freq: Four times a day (QID) | ORAL | Status: DC | PRN

## 2015-04-26 NOTE — Consult Note (Signed)
Referring Physician: Tomi Bamberger    Chief Complaint: left arm and leg weakness  HPI:                                                                                                                                         [name] is an 54 y.o. 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  Since that date he has had chronic left arm and leg weakness. Patient went to rehab on that hospital visit and he states he as had chronic weakness of left arm and contractions of his left hand. Today EMS was called to house due to patient waking up with what he felt was increased weakness on the left side. Code stroke was called but cancled on arrival as he has chronic left sided weakness and his LSN was last night.  Patients history is ever evolving and continues to change. No clear history is obtainable.   Date last known well: Date: 04/25/2015 Time last known well: Unable to determine tPA Given: No: out of window and minimal symptoms.      Past Medical History  Diagnosis Date  . Diabetes mellitus   . Hypertension   . 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 Cocaine abuse   . Chronic pain   . Alcohol abuse   . Tobacco abuse   . Gait instability   . Chronic arm pain   . Chronic leg pain   . Peripheral edema     chronic LUE    Past Surgical History  Procedure Laterality Date  . Left arm skin graft    . Left heart catheterization with coronary angiogram Bilateral 12/26/2013    Procedure: LEFT HEART CATHETERIZATION WITH CORONARY ANGIOGRAM;  Surgeon: Burnell Blanks, MD;  Location: [facility] CATH LAB;  Service: Cardiovascular;  Laterality: Bilateral;  . Cardiac catheterization  12/27/2013  . Cardiac catheterization  12/27/2013    Impression: 1. 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 Moderate disease in the small caliber intermediate branch, 3. Normal LV systolic function    Family History  Problem  Relation Age of Onset  . Hypertension Mother   . Diabetes Mother   . Cancer Mother     breast cancer   . Hypertension Father   . Heart disease Father   . Stroke Father   . Hypertension Sister    Social History:  reports that he has been smoking Cigarettes.  He has been smoking about 0.25 packs per day. He has never used smokeless tobacco. He reports that he drinks alcohol. He reports that he does not use illicit drugs.  Allergies: No Known Allergies  Medications:  No current facility-administered medications for this encounter.   Current Outpatient Prescriptions  Medication Sig Dispense Refill  . aspirin EC 81 MG EC tablet Take 1 tablet (81 mg total) by mouth daily. (Patient not taking: Reported on 03/15/2015)    . atorvastatin (LIPITOR) 40 MG tablet Take 1 tablet (40 mg total) by mouth daily. 30 tablet 5  . carbamide peroxide (DEBROX) 6.5 % otic solution Place 5 drops into both ears 2 (two) times daily. For 3 days prior to next visit 15 mL 0  . doxycycline (VIBRA-TABS) 100 MG tablet Take 1 tablet (100 mg total) by mouth 2 (two) times daily. 20 tablet 0  . gabapentin (NEURONTIN) 300 MG capsule Take 1 capsule (300 mg total) by mouth 3 (three) times daily. 90 capsule 3  . glipiZIDE (GLUCOTROL) 10 MG tablet Take 1 tablet (10 mg total) by mouth 2 (two) times daily before a meal. 60 tablet 3  . glucose monitoring kit (FREESTYLE) monitoring kit 1 each by Does not apply route as needed for other. Dispense any model that is covered- dispense testing supplies for Q AC/ HS accuchecks- 1 month supply with one refil. 1 each 1  . lisinopril (PRINIVIL,ZESTRIL) 20 MG tablet Take 1 tablet (20 mg total) by mouth daily. 30 tablet 5  . metFORMIN (GLUCOPHAGE) 1000 MG tablet Take 1 tablet (1,000 mg total) by mouth 2 (two) times daily with a meal. 60 tablet 5  . terbinafine (LAMISIL AT) 1 %  cream Apply 1 application topically 2 (two) times daily. 30 g 0     ROS:                                                                                                                                       History obtained from the patient  General ROS: negative for - chills, fatigue, fever, night sweats, weight gain or weight loss Psychological ROS: negative for - behavioral disorder, hallucinations, memory difficulties, mood swings or suicidal ideation Ophthalmic ROS: negative for - blurry vision, double vision, eye pain or loss of vision ENT ROS: negative for - epistaxis, nasal discharge, oral lesions, sore throat, tinnitus or vertigo Allergy and Immunology ROS: negative for - hives or itchy/watery eyes Hematological and Lymphatic ROS: negative for - bleeding problems, bruising or swollen lymph nodes Endocrine ROS: negative for - galactorrhea, hair pattern changes, polydipsia/polyuria or temperature intolerance Respiratory ROS: negative for - cough, hemoptysis, shortness of breath or wheezing Cardiovascular ROS: negative for - chest pain, dyspnea on exertion, edema or irregular heartbeat Gastrointestinal ROS: negative for - abdominal pain, diarrhea, hematemesis, nausea/vomiting or stool incontinence Genito-Urinary ROS: negative for - dysuria, hematuria, incontinence or urinary frequency/urgency Musculoskeletal ROS: negative for - joint swelling or muscular weakness Neurological ROS: as noted in HPI Dermatological ROS: negative for rash and skin lesion changes  Neurologic Examination:  Blood pressure 167/79, pulse 91, temperature 98.7 F (37.1 C), temperature source Oral, resp. rate 15, SpO2 99 %.  HEENT-  Normocephalic, no lesions, without obvious abnormality.  Normal external eye and conjunctiva.  Normal TM's bilaterally.  Normal auditory canals and external ears. Normal external nose,  mucus membranes and septum.  Normal pharynx. Cardiovascular- S1, S2 normal, pulses palpable throughout   Lungs- chest clear, no wheezing, rales, normal symmetric air entry Abdomen- normal findings: bowel sounds normal Extremities- no edema Lymph-no adenopathy palpable Musculoskeletal-no joint tenderness, deformity or swelling Skin-warm and dry, no hyperpigmentation, vitiligo, or suspicious lesions  Neurological Examination Mental Status: Alert, oriented, thought content appropriate.  Speech fluent without evidence of aphasia.  Able to follow 3 step commands without difficulty. Cranial Nerves: II: Discs flat bilaterally; Visual fields grossly normal, pupils equal, round, reactive to light and accommodation III,IV, VI: ptosis not present, extra-ocular motions intact bilaterally V,VII: smile symmetric, facial light touch sensation normal bilaterally VIII: hearing normal bilaterally IX,X: uvula rises symmetrically XI: bilateral shoulder shrug XII: midline tongue extension Motor: Right : Upper extremity   5/5    Left:     Upper extremity   4/5  Lower extremity   5/5     Lower extremity   3/5 --increased tone in the right hand.  Sensory: Pinprick and light touch intact throughout, bilaterally Deep Tendon Reflexes: 2+ right UE and LE with 3+ left UE and LE Plantars: Right: downgoing   Left: downgoing Cerebellar: normal finger-to-nose, unable to obtain  heel-to-shin test due to preceived weakness on the left  Gait: not tested        Lab Results: Basic Metabolic Panel:  Recent Labs Lab 04/26/15 1156  NA 135  K 4.7  CL 101  GLUCOSE 128*  BUN 22*  CREATININE 1.30*    Liver Function Tests: No results for input(s): AST, ALT, ALKPHOS, BILITOT, PROT, ALBUMIN in the last 168 hours. No results for input(s): LIPASE, AMYLASE in the last 168 hours. No results for input(s): AMMONIA in the last 168 hours.  CBC:  Recent Labs Lab 04/26/15 1151 04/26/15 1156  WBC 12.3*  --    NEUTROABS 7.4  --   HGB 14.0 16.0  HCT 41.5 47.0  MCV 83.2  --   PLT 203  --     Cardiac Enzymes: No results for input(s): CKTOTAL, CKMB, CKMBINDEX, TROPONINI in the last 168 hours.  Lipid Panel: No results for input(s): CHOL, TRIG, HDL, CHOLHDL, VLDL, LDLCALC in the last 168 hours.  CBG: No results for input(s): GLUCAP in the last 168 hours.  Microbiology: Results for orders placed or performed during the hospital encounter of 12/26/13  MRSA PCR Screening     Status: None   Collection Time: 12/26/13  3:50 AM  Result Value Ref Range Status   MRSA by PCR NEGATIVE NEGATIVE Final    Comment:        The GeneXpert MRSA Assay (FDA approved for NASAL specimens only), is one component of a comprehensive MRSA colonization surveillance program. It is not intended to diagnose MRSA infection nor to guide or monitor treatment for MRSA infections.    Coagulation Studies:  Recent Labs  04/26/15 1151  LABPROT 14.4  INR 1.10    Imaging: Ct Head Wo Contrast  04/26/2015   CLINICAL DATA:  CVA. Acute on chronic left-sided weakness and numbness.  EXAM: CT HEAD WITHOUT CONTRAST  TECHNIQUE: Contiguous axial images were obtained from the base of the skull through the vertex without intravenous contrast.  COMPARISON:  12/26/2013 head CT.  FINDINGS: No evidence of parenchymal hemorrhage or extra-axial fluid collection. No mass lesion, mass effect, or midline shift.  No CT evidence of acute infarction.  There is stable mild generalized cerebral volume loss. There is intracranial atherosclerotic internal carotid artery calcification. 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 There is stable chronic atrophy of the corpus callosum. No ventriculomegaly.  The visualized paranasal sinuses are essentially clear. The mastoid air cells are unopacified. No evidence of calvarial fracture. There  are multiple scattered superficial scalp lesions throughout the bilateral scalp of variable density, largest 2.0 cm in the right posterior scalp, most in keeping sebaceous cysts.  IMPRESSION: 1. No acute intracranial abnormality. 2. Stable generalized cerebral volume loss. 3. 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  These results were called by telephone at the time of interpretation on 04/26/2015 at 12:07 pm to Dr. Armida Sans, who verbally acknowledged these results.   Electronically Signed   By: Ilona Sorrel M.D.   On: 04/26/2015 12:14       Assessment and plan discussed with with attending physician and they are in agreement.    Etta Quill PA-C [facility] [phone]  04/26/2015, 12:26 PM   Assessment: 54 y.o. male with known left sided weakness due to C5/C6 cord contusion in the past. Now presenting with increased left sided weakness. Etiology unclear but cannot rule out underlying infection causing increased weakness, worsening cervical myelopathy versus stroke.   Stroke Risk Factors - diabetes mellitus and hypertension  Recommend: 1) MRI brain and cervical spine.  2) Stroke team to follow in AM.   Patient seen and examined together with physician assistant and I concur with the assessment and plan.  Dorian Pod, MD

## 2015-04-26 NOTE — ED Notes (Signed)
Patient has returned from being out of the department; patient placed back on monitor, continuous pulse oximetry and blood pressure cuff 

## 2015-04-26 NOTE — Progress Notes (Addendum)
Paged rapid for possible Code Stroke. R/o as LKW was undetermined and left sides weakness is chronic from previous stroke. STAT CTA ordered and MRI of head and spine to follow. Will continue to monitor. Joaquin Bend E, RN 04/26/2015 \\9 :57 PM

## 2015-04-26 NOTE — ED Notes (Signed)
Patient has returned from CT; patient undressed, in gown, on monitor, continuous pulse oximetry and blood pressure cuff

## 2015-04-26 NOTE — Progress Notes (Signed)
Pt seen at bedside. Does not appear lethargic, NIHSS completed yielding 5 for weakness in bilateral extremities, left greater than right, mld ataxia, and sensory impairment. . Left side weakness at baseline and sensory impairment also assessed on admit, never resolved since onset today. Original LSW time 2200 04/25/15. Neurology paged per bedside RN and Pt for CTA Stat, also for MRI tonight. RN to monitor closely. RRT to follow tonight.

## 2015-04-26 NOTE — ED Notes (Signed)
Pt to department via EMS- pt initially reported that he had weakness this morning when he woke up and was normal last night. On arrival to the room pt reports he has been weak for about a month. Pt with left sided weakness to the arm and leg. Bp-160/90 Hr-100 Cbg-131

## 2015-04-26 NOTE — ED Provider Notes (Signed)
CSN: [id number]     Arrival date & time 04/26/15  1147 History   First MD Initiated Contact with Patient 04/26/15 1201     Chief Complaint  Patient presents with  . Code Stroke     HPI Patient presents to the emergency room for evaluation of left-sided weakness involving his left arm and left leg. Patient does have a history of left-sided weakness associated with a spinal cord contusion. Patient states when he woke up this morning he was noticing worsening weakness in his left arm and left leg. Family members called 911. Patient presented to the emergency room as a possible acute stroke.  The last time the patient felt more at his baseline was last evening. He denies any trouble with any chest pain or shortness of breath. No vomiting or diarrhea. Past Medical History  Diagnosis Date  . Diabetes mellitus   . Hypertension   . 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 Cocaine abuse   . Chronic pain   . Alcohol abuse   . Tobacco abuse   . Gait instability   . Chronic arm pain   . Chronic leg pain   . Peripheral edema     chronic LUE   Past Surgical History  Procedure Laterality Date  . Left arm skin graft    . Left heart catheterization with coronary angiogram Bilateral 12/26/2013    Procedure: LEFT HEART CATHETERIZATION WITH CORONARY ANGIOGRAM;  Surgeon: Burnell Blanks, MD;  Location: [facility] CATH LAB;  Service: Cardiovascular;  Laterality: Bilateral;  . Cardiac catheterization  12/27/2013  . Cardiac catheterization  12/27/2013    Impression: 1. 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 Moderate disease in the small caliber intermediate branch, 3. Normal LV systolic function   Family History  Problem Relation Age of Onset  . Hypertension Mother   . Diabetes Mother   . Cancer Mother     breast cancer   . Hypertension Father   . Heart disease Father   . Stroke Father   . Hypertension Sister    Social History  Substance Use Topics  . Smoking status: Current  Every Day Smoker -- 0.25 packs/day    Types: Cigarettes  . Smokeless tobacco: Never Used  . Alcohol Use: Yes     Comment: occassional    Review of Systems  All other systems reviewed and are negative.     Allergies  Review of patient's allergies indicates no known allergies.  Home Medications   Prior to Admission medications   Medication Sig Start Date End Date Taking? Authorizing Provider  aspirin EC 81 MG EC tablet Take 1 tablet (81 mg total) by mouth daily. Patient not taking: Reported on 03/15/2015 01/02/14   Delfina Redwood, MD  atorvastatin (LIPITOR) 40 MG tablet Take 1 tablet (40 mg total) by mouth daily. 03/16/15   Josalyn Funches, MD  carbamide peroxide (DEBROX) 6.5 % otic solution Place 5 drops into both ears 2 (two) times daily. For 3 days prior to next visit 03/15/15   Boykin Nearing, MD  doxycycline (VIBRA-TABS) 100 MG tablet Take 1 tablet (100 mg total) by mouth 2 (two) times daily. 03/15/15   Boykin Nearing, MD  gabapentin (NEURONTIN) 300 MG capsule Take 1 capsule (300 mg total) by mouth 3 (three) times daily. 11/23/14   Lorayne Marek, MD  glipiZIDE (GLUCOTROL) 10 MG tablet Take 1 tablet (10 mg total) by mouth 2 (two) times daily before a meal. 03/15/15  Boykin Nearing, MD  glucose monitoring kit (FREESTYLE) monitoring kit 1 each by Does not apply route as needed for other. Dispense any model that is covered- dispense testing supplies for Q AC/ HS accuchecks- 1 month supply with one refil. 05/22/14   Lorayne Marek, MD  lisinopril (PRINIVIL,ZESTRIL) 20 MG tablet Take 1 tablet (20 mg total) by mouth daily. 03/16/15   Boykin Nearing, MD  metFORMIN (GLUCOPHAGE) 1000 MG tablet Take 1 tablet (1,000 mg total) by mouth 2 (two) times daily with a meal. 03/15/15   Josalyn Funches, MD  terbinafine (LAMISIL AT) 1 % cream Apply 1 application topically 2 (two) times daily. 03/15/15   Josalyn Funches, MD   BP 163/74 mmHg  Pulse 91  Temp(Src) 98.7 F (37.1 C) (Oral)  Resp 15  SpO2  98% Physical Exam  Constitutional: He appears well-developed and well-nourished. No distress.  Overweight  HENT:  Head: Normocephalic and atraumatic.  Right Ear: External ear normal.  Left Ear: External ear normal.  Eyes: Conjunctivae are normal. Right eye exhibits no discharge. Left eye exhibits no discharge. No scleral icterus.  Neck: Neck supple. No tracheal deviation present.  Cardiovascular: Normal rate, regular rhythm and intact distal pulses.   Pulmonary/Chest: Effort normal and breath sounds normal. No stridor. No respiratory distress. He has no wheezes. He has no rales.  Abdominal: Soft. Bowel sounds are normal. He exhibits no distension. There is no tenderness. There is no rebound and no guarding.  Musculoskeletal: He exhibits no edema or tenderness.  Neurological: He is alert. No cranial nerve deficit (no facial droop, extraocular movements intact, no slurred speech) or sensory deficit. He exhibits normal muscle tone. He displays no seizure activity. Coordination normal.  Weakness of the left lower extremity and left upper extremity, please see the neurologist evaluation for full neurologic exam  Skin: Skin is warm and dry. No rash noted.  Psychiatric: He has a normal mood and affect.  Nursing note and vitals reviewed.   ED Course  Procedures (including critical care time) Labs Review Labs Reviewed  CBC - Abnormal; Notable for the following:    WBC 12.3 (*)    All other components within normal limits  DIFFERENTIAL - Abnormal; Notable for the following:    Monocytes Absolute 1.6 (*)    All other components within normal limits  I-STAT CHEM 8, ED - Abnormal; Notable for the following:    BUN 22 (*)    Creatinine, Ser 1.30 (*)    Glucose, Bld 128 (*)    All other components within normal limits  PROTIME-INR  APTT  ETHANOL  COMPREHENSIVE METABOLIC PANEL  URINE RAPID DRUG SCREEN, HOSP PERFORMED  URINALYSIS, ROUTINE W REFLEX MICROSCOPIC (NOT AT [facility])  I-STAT TROPOININ,  ED    Imaging Review Ct Head Wo Contrast  04/26/2015   CLINICAL DATA:  CVA. Acute on chronic left-sided weakness and numbness.  EXAM: CT HEAD WITHOUT CONTRAST  TECHNIQUE: Contiguous axial images were obtained from the base of the skull through the vertex without intravenous contrast.  COMPARISON:  12/26/2013 head CT.  FINDINGS: No evidence of parenchymal hemorrhage or extra-axial fluid collection. No mass lesion, mass effect, or midline shift.  No CT evidence of acute infarction.  There is stable mild generalized cerebral volume loss. There is intracranial atherosclerotic internal carotid artery calcification. 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 There is stable chronic atrophy of the corpus callosum. No ventriculomegaly.  The  visualized paranasal sinuses are essentially clear. The mastoid air cells are unopacified. No evidence of calvarial fracture. There are multiple scattered superficial scalp lesions throughout the bilateral scalp of variable density, largest 2.0 cm in the right posterior scalp, most in keeping sebaceous cysts.  IMPRESSION: 1. No acute intracranial abnormality. 2. Stable generalized cerebral volume loss. 3. 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  These results were called by telephone at the time of interpretation on 04/26/2015 at 12:07 pm to Dr. Armida Sans, who verbally acknowledged these results.   Electronically Signed   By: Ilona Sorrel M.D.   On: 04/26/2015 12:14   I have personally reviewed and evaluated these images and lab results as part of my medical decision-making.   EKG Interpretation   Date/Time:  Monday April 26 2015 12:05:42 EDT Ventricular Rate:  94 PR Interval:  151 QRS Duration: 116 QT Interval:  381 QTC Calculation: 476 R Axis:   -64 Text Interpretation:  Sinus rhythm Left anterior  fascicular block Probable  left ventricular hypertrophy ST elevation, consider anterolateral injury  No significant change since last tracing Confirmed by KNAPP  MD-J, JON  ([id number]) on 04/26/2015 12:12:55 PM      MDM   Final diagnoses:  Weakness of left arm  Weakness of left arm    The patient's symptoms in the past were related to a cervical myelopathy. There certainly is still the possibility of a stroke causing his symptoms. Patient is not a candidate for any acute stroke intervention based on the duration of his symptoms. Plan is to admit him to the hospital for further evaluation.    Dorie Rank, MD 04/26/15 1239

## 2015-04-26 NOTE — ED Notes (Signed)
Patient given an urinal to use 

## 2015-04-26 NOTE — Code Documentation (Signed)
54yo male arriving to [facility] via [place] at 50.  Patient initially reporting noticing left sided numbness and weakness at 0500 this morning.  Family noticed symptoms at 0830.  EMS activated a Code Stroke.  Patient reports a h/o stroke with no residual deficits.  Stroke team at the bedside on arrival.  Labs drawn and patient cleared by Dr. Martyn Ehrich.  On arrival patient reports that he noticed the symptoms as soon as he woke up this morning.  He reports going to bed at 2200 at his baseline.  Patient to CT.  NIHSS 4, see documentation for details and code stroke times.  Patient with bilateral leg weakness and left sided decreased sensation.  Upon further inquiry patient reports left sided weakness x 1 month.  Patient is outside the window for treatment with tPA.  No acute stroke treatment at this time.  Bedside handoff with ED RN Ria Comment.

## 2015-04-26 NOTE — H&P (Signed)
[facility] History and Physical  [name] WIO:[id number] DOB: 1961-01-11 DOA: 04/26/2015  Referring physician:    Emergency Department PCP: Curtis Ends, MD   Chief Complaint:  left sided weakness    HPI: [name] is a 54 y.o. male with a history of diabetes, HTN, and CAD. He had a spinal cord injury in 2015 resulting in left-sided weakness. For the last month his left-sided weakness has been progressive. He also complains of associated numbness and discomfort in the left upper and left lower extremity. Around 6:00 AM patient's left-sided weakness became much worse, he was brought to the emergency department by EMS.. Patient has history of polysubstance abuse including cocaine. He no longer uses cocaine or any other drugs. No recent medication changes. No loss of bowel or bladder function. No other complaints.   ED pertinent findings / treatment:   Vital signs are stable. White count mildly elevated at 12.3. Hemoglobin 14. Creatinine mildly elevated at 1.2 9. CMET otherwise unremarkable. Troponin negative. Alcohol level less than 5.  CT scan of the head without contrast unremarkable.   Review of Systems:  Constitutional:  No weight loss, night sweats, fevers, chills, fatigue.  HEENT:  No headaches, difficulty swallowing, tooth/dental problems, sore throat,  sneezing, itching, ear ache, nasal congestion, post nasal drip Cardio-vascular:  No chest pain, Orthopnea, PND, swelling in lower extremities, anasarca, dizziness, palpitations  GI:  No heartburn, indigestion, abdominal pain, nausea, ++constipation for one week.  No vomiting, diarrhea, loss of appetite  Resp:  No shortness of breath with exertion or at rest. No excess mucus, no productive cough, no non-productive cough, coughing up of blood. No change in color of mucus. No wheezing. No chest wall deformity  Skin:  no rash or lesions.  GU:  no dysuria, change in color of urine, urgency or frequency. No  flank pain.  Musculoskeletal:  No joint pain or swelling. No decreased range of motion. No back pain.  Psych:  No change in mood or affect. No depression or anxiety. No memory loss.   Past Medical History  Diagnosis Date  . Diabetes mellitus   . Hypertension   . 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 Cocaine abuse   . Chronic pain   . Alcohol abuse   . Tobacco abuse   . Gait instability   . Chronic arm pain   . Chronic leg pain   . Peripheral edema     chronic LUE   Past Surgical History  Procedure Laterality Date  . Left arm skin graft    . Left heart catheterization with coronary angiogram Bilateral 12/26/2013    Procedure: LEFT HEART CATHETERIZATION WITH CORONARY ANGIOGRAM;  Surgeon: Burnell Blanks, MD;  Location: [facility] CATH LAB;  Service: Cardiovascular;  Laterality: Bilateral;  . Cardiac catheterization  12/27/2013  . Cardiac catheterization  12/27/2013    Impression: 1. 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 Moderate disease in the small caliber intermediate branch, 3. Normal LV systolic function   Social History:  reports that he has been smoking Cigarettes.  He has been smoking about 0.25 packs per day. He has never used smokeless tobacco. He reports that he drinks alcohol. He reports that he does not use illicit drugs.  Lives:  At home With:  alone Assistive Devices:  Uses cane and / or walker  No Known Allergies  Family History  Problem Relation Age of Onset  . Hypertension Mother   .  Diabetes Mother   . Cancer Mother     breast cancer   . Hypertension Father   . Heart disease Father   . Stroke Father   . Hypertension Sister     Prior to Admission medications   Medication Sig Start Date End Date Taking? Authorizing Provider  aspirin EC 81 MG EC tablet Take 1 tablet (81 mg total) by mouth daily. Patient not taking: Reported on 03/15/2015 01/02/14   Delfina Redwood, MD  atorvastatin (LIPITOR) 40 MG tablet Take 1 tablet (40 mg total)  by mouth daily. 03/16/15   Josalyn Funches, MD  carbamide peroxide (DEBROX) 6.5 % otic solution Place 5 drops into both ears 2 (two) times daily. For 3 days prior to next visit 03/15/15   Boykin Nearing, MD  doxycycline (VIBRA-TABS) 100 MG tablet Take 1 tablet (100 mg total) by mouth 2 (two) times daily. 03/15/15   Boykin Nearing, MD  gabapentin (NEURONTIN) 300 MG capsule Take 1 capsule (300 mg total) by mouth 3 (three) times daily. 11/23/14   Lorayne Marek, MD  glipiZIDE (GLUCOTROL) 10 MG tablet Take 1 tablet (10 mg total) by mouth 2 (two) times daily before a meal. 03/15/15   Josalyn Funches, MD  glucose monitoring kit (FREESTYLE) monitoring kit 1 each by Does not apply route as needed for other. Dispense any model that is covered- dispense testing supplies for Q AC/ HS accuchecks- 1 month supply with one refil. 05/22/14   Lorayne Marek, MD  lisinopril (PRINIVIL,ZESTRIL) 20 MG tablet Take 1 tablet (20 mg total) by mouth daily. 03/16/15   Boykin Nearing, MD  metFORMIN (GLUCOPHAGE) 1000 MG tablet Take 1 tablet (1,000 mg total) by mouth 2 (two) times daily with a meal. 03/15/15   Josalyn Funches, MD  terbinafine (LAMISIL AT) 1 % cream Apply 1 application topically 2 (two) times daily. 03/15/15   Boykin Nearing, MD   Physical Exam: Filed Vitals:   04/26/15 1209 04/26/15 1219 04/26/15 1230 04/26/15 1245  BP: 167/79  163/74 157/70  Pulse: 91  91 89  Temp: 98.7 F (37.1 C) 98.7 F (37.1 C)    TempSrc: Oral     Resp: '15  15 15  ' SpO2: 99%  98% 98%    Wt Readings from Last 3 Encounters:  03/15/15 117.935 kg (260 lb)  12/04/14 119.75 kg (264 lb)  09/18/14 119.024 kg (262 lb 6.4 oz)    General:  Pleasant well nourished black male. Appears calm and comfortable Eyes: PER, normal lids, irises & conjunctiva ENT: grossly normal hearing, lips & tongue Neck: no LAD, masses  Cardiovascular: RRR, 1+ RLE edema, 2-3 + LLE edema. Top of left foot slightly erythematous. Large callus under left big toe. Both  feet are warm.   Respiratory: CTA bilaterally, no w/r/r. Normal respiratory effort. Abdomen: soft, non-distended, nontender. Bowel sounds present. No masses appreciated. Skin: no rash  Musculoskeletal: 3rd, 4th and 5th left fingers fixed in extended position Psychiatric: grossly normal mood and affect, speech fluent and appropriate Neurologic: grossly non-focal.         Labs on Admission:  Basic Metabolic Panel:  Recent Labs Lab 04/26/15 1151 04/26/15 1156  NA 133* 135  K 4.8 4.7  CL 102 101  CO2 21*  --   GLUCOSE 126* 128*  BUN 18 22*  CREATININE 1.29* 1.30*  CALCIUM 9.3  --    Liver Function Tests:  Recent Labs Lab 04/26/15 1151  AST 19  ALT 18  ALKPHOS 54  BILITOT 0.9  PROT 7.1  ALBUMIN 3.9    CBC:  Recent Labs Lab 04/26/15 1151 04/26/15 1156  WBC 12.3*  --   NEUTROABS 7.4  --   HGB 14.0 16.0  HCT 41.5 47.0  MCV 83.2  --   PLT 203  --     Radiological Exams on Admission: Ct Head Wo Contrast  04/26/2015   CLINICAL DATA:  CVA. Acute on chronic left-sided weakness and numbness.  EXAM: CT HEAD WITHOUT CONTRAST  TECHNIQUE: Contiguous axial images were obtained from the base of the skull through the vertex without intravenous contrast.  COMPARISON:  12/26/2013 head CT.  FINDINGS: No evidence of parenchymal hemorrhage or extra-axial fluid collection. No mass lesion, mass effect, or midline shift.  No CT evidence of acute infarction.  There is stable mild generalized cerebral volume loss. There is intracranial atherosclerotic internal carotid artery calcification. 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 There is stable chronic atrophy of the corpus callosum. No ventriculomegaly.  The visualized paranasal sinuses are essentially clear. The mastoid air cells are unopacified. No evidence of calvarial fracture. There are multiple scattered superficial scalp  lesions throughout the bilateral scalp of variable density, largest 2.0 cm in the right posterior scalp, most in keeping sebaceous cysts.  IMPRESSION: 1. No acute intracranial abnormality. 2. Stable generalized cerebral volume loss. 3. 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  These results were called by telephone at the time of interpretation on 04/26/2015 at 12:07 pm to Dr. Armida Sans, who verbally acknowledged these results.   Electronically Signed   By: Ilona Sorrel M.D.   On: 04/26/2015 12:14    EKG: Sinus rhythm Left anterior fascicular block Probable left ventricular hypertrophy ST elevation, consider anterolateral injury No significant change since last tracing  Cardiac Cath June 2015  Impression: 1. Mild disease in the RCA 2. Moderate disease in the small caliber intermediate branch 3. Normal LV systolic function ASSESSMENT / PLAN:     Acute on chronic left-sided weakness. Chronic left sided weakness since spinal cord injury last year. Now with progressive left sided weakness, numbness. Rule out infection , worsening cervical myelopathy or stroke. Will admit to telemetry  Check TSH / B12   Neurology has evaluated patient. Etiology of symptoms unclear and recommending MRI of brain /         c-spine  If MRI is stable would order PT / OT evaluation in am.    Diabetes mellitus, type 2 uncontrolled. Last hgb A1c 03/15/15 was 8.0.   Hold metformin. Continue glucotrol.   Place on accuchecks and SSI.     Hyperlipidemia. Continue home statin.   Hypertension. Hold home ACEI given elevated creatinine.  Start prn Hydralazine for SBP >160.   Diabetic neuropathy. Continue Gabapentin.   Constipation. One week history. Start stool softener.   Callus, left great toe. Foot swollen, slightly erythematous. Will obtain xray to rule out out osteomyelitis.      Consults. Neurology.   Code Status: Full code DVT Prophylaxis:  Lovenox Family Communication: Patient alert, oriented and understands plan of care.   Disposition Plan:  Discharge to home in 24-48 hours.  Time spent: 60 minutes  [name] , NP [facility] Pager [phone]

## 2015-04-26 NOTE — Progress Notes (Signed)
Patient admitted to the floor. Patient alert and oriented x 4. Patient oriented to the room. Denies any need at this time.

## 2015-04-26 NOTE — ED Notes (Signed)
Stroke team and EDP at the bedside

## 2015-04-27 ENCOUNTER — Observation Stay (HOSPITAL_COMMUNITY): Payer: Medicaid Other

## 2015-04-27 DIAGNOSIS — M6289 Other specified disorders of muscle: Secondary | ICD-10-CM

## 2015-04-27 DIAGNOSIS — I10 Essential (primary) hypertension: Secondary | ICD-10-CM

## 2015-04-27 LAB — URINALYSIS W MICROSCOPIC (NOT AT ARMC)
Bilirubin Urine: NEGATIVE
GLUCOSE, UA: NEGATIVE mg/dL
Hgb urine dipstick: NEGATIVE
Ketones, ur: NEGATIVE mg/dL
Leukocytes, UA: NEGATIVE
NITRITE: NEGATIVE
PROTEIN: NEGATIVE mg/dL
Specific Gravity, Urine: 1.024 (ref 1.005–1.030)
UROBILINOGEN UA: 0.2 mg/dL (ref 0.0–1.0)
pH: 5 (ref 5.0–8.0)

## 2015-04-27 LAB — BASIC METABOLIC PANEL
Anion gap: 5 (ref 5–15)
BUN: 18 mg/dL (ref 6–20)
CHLORIDE: 103 mmol/L (ref 101–111)
CO2: 27 mmol/L (ref 22–32)
CREATININE: 1.2 mg/dL (ref 0.61–1.24)
Calcium: 9 mg/dL (ref 8.9–10.3)
GFR calc non Af Amer: >60 mL/min (ref >60)
GLUCOSE: 111 mg/dL — AB (ref 65–99)
Potassium: 5 mmol/L (ref 3.5–5.1)
Sodium: 135 mmol/L (ref 135–145)

## 2015-04-27 MED ORDER — METFORMIN HCL 1000 MG PO TABS: 1000.0000 mg | ORAL_TABLET | Freq: Two times a day (BID) | ORAL | Status: DC

## 2015-04-27 MED ORDER — GADOBENATE DIMEGLUMINE 529 MG/ML IV SOLN
20.0000 mL | Freq: Once | INTRAVENOUS | Status: AC | PRN
  Administered 2015-04-27: 20 mL via INTRAVENOUS

## 2015-04-27 NOTE — Evaluation (Signed)
Physical Therapy Evaluation Patient Details Name: [name] MRN: [id number] DOB: [birth year] Today's Date: 04/27/2015   History of Present Illness  [name] is a 54 y.o. male with a history of diabetes, HTN, and CAD. He had a spinal cord injury in 2015 resulting in left-sided weakness. For the last month his left-sided weakness has been progressive. He also complains of associated numbness and discomfort in the left upper and left lower extremity. Around 6:00 AM patient's left-sided weakness became much worse, he was brought to the emergency department by EMS.. Patient has history of polysubstance abuse including cocaine. He no longer uses cocaine or any other drugs. No recent medication changes. No loss of bowel or bladder function. No other complaints.   Clinical Impression  Pt functioning near baseline with residual L sided weakness. Pt required use of cane and or walker PTA and was limited to ambulation <1 block due to fatigue. Pt functioning at supervision level and has friends/ family that can check on him as they do his grocery shopping and errands since he can not drive. Acute PT to con't to follow to progress independence as able.    Follow Up Recommendations No PT follow up;Supervision - Intermittent (medicaid doesn't cover home health PT)    Equipment Recommendations  Rolling walker with 5" wheels    Recommendations for Other Services       Precautions / Restrictions Precautions Precautions: Fall Restrictions Weight Bearing Restrictions: No      Mobility  Bed Mobility Overal bed mobility: Modified Independent             General bed mobility comments: increased time but able to complete task  Transfers Overall transfer level: Needs assistance Equipment used: Rolling walker (2 wheeled) Transfers: Sit to/from Stand Sit to Stand: Supervision         General transfer comment: pt with good technique  Ambulation/Gait Ambulation/Gait assistance:  Supervision Ambulation Distance (Feet): 75 Feet Assistive device: Rolling walker (2 wheeled) Gait Pattern/deviations: Decreased stride length;Step-through pattern     General Gait Details: pt with residual L sided weakness from previous stroke. c/o L calf pain/soreness during ambulation. pt reports he is more stiff than before  Stairs            Wheelchair Mobility    Modified Rankin (Stroke Patients Only)       Balance Overall balance assessment: Needs assistance           Standing balance-Leahy Scale: Fair                               Pertinent Vitals/Pain Pain Assessment: 0-10 Pain Score: 5  Pain Location: L calf Pain Descriptors / Indicators: Sore    Home Living Family/patient expects to be discharged to:: Private residence Living Arrangements: Alone Available Help at Discharge: Available PRN/intermittently;Friend(s);Family Type of Home: Apartment Home Access: Level entry     Home Layout: One level Home Equipment: [place] - 2 wheels;Cane - single point      Prior Function Level of Independence: Needs assistance   Gait / Transfers Assistance Needed: used cane primarily. reports he can only walk about 1 block prior to fatiguing     Comments: had family/friends grocery shop for hime     Hand Dominance   Dominant Hand: Right    Extremity/Trunk Assessment  Communication   Communication: No difficulties  Cognition Arousal/Alertness: Awake/alert Behavior During Therapy: WFL for tasks assessed/performed Overall Cognitive Status: Within Functional Limits for tasks assessed                      General Comments      Exercises        Assessment/Plan    PT Assessment Patient needs continued PT services  PT Diagnosis Generalized weakness   PT Problem List Decreased strength;Decreased activity tolerance;Decreased mobility;Decreased balance  PT Treatment Interventions DME  instruction;Gait training;Stair training;Functional mobility training;Therapeutic exercise;Therapeutic activities   PT Goals (Current goals can be found in the Care Plan section) Acute Rehab PT Goals PT Goal Formulation: With patient Time For Goal Achievement: 05/04/15 Additional Goals Additional Goal #1: pt to score >19 on DGI to indicate minimal falls risk.    Frequency Min 3X/week   Barriers to discharge Decreased caregiver support      Co-evaluation               End of Session Equipment Utilized During Treatment: Gait belt Activity Tolerance: Patient limited by fatigue Patient left: in chair;with chair alarm set Nurse Communication: Mobility status    Functional Assessment Tool Used: clinical judgement Functional Limitation: Mobility: Walking and moving around Mobility: Walking and Moving Around Current Status (K8768): At least 1 percent but less than 20 percent impaired, limited or restricted Mobility: Walking and Moving Around Goal Status [phone]): At least 1 percent but less than 20 percent impaired, limited or restricted    Time: 6203-5597 PT Time Calculation (min) (ACUTE ONLY): 30 min   Charges:   PT Evaluation $Initial PT Evaluation Tier I: 1 Procedure PT Treatments $Gait Training: 8-22 mins   PT G Codes:   PT G-Codes **NOT FOR INPATIENT CLASS** Functional Assessment Tool Used: clinical judgement Functional Limitation: Mobility: Walking and moving around Mobility: Walking and Moving Around Current Status (C1638): At least 1 percent but less than 20 percent impaired, limited or restricted Mobility: Walking and Moving Around Goal Status [phone]): At least 1 percent but less than 20 percent impaired, limited or restricted    Kingsley Callander 04/27/2015, 3:37 PM   Kittie Plater, PT, DPT Pager #: [phone] Office #: [phone]

## 2015-04-27 NOTE — Care Management Note (Signed)
Case Management Note  Patient Details  Name: [name] MRN: [id number] Date of Birth: March 07, 1961  Subjective/Objective:                    Action/Plan: Patient admitted with Lt sided weakness. Pt is from home alone. Awaiting PT/OT recommendations for discharge disposition. CM will continue to follow for discharge needs.   Expected Discharge Date:                  Expected Discharge Plan:  Home/Self Care  In-House Referral:     Discharge planning Services     Post Acute Care Choice:    Choice offered to:     DME Arranged:    DME Agency:     HH Arranged:    HH Agency:     Status of Service:  In process, will continue to follow  Medicare Important Message Given:    Date Medicare IM Given:    Medicare IM give by:    Date Additional Medicare IM Given:    Additional Medicare Important Message give by:     If discussed at [place] of Stay Meetings, dates discussed:    Additional Comments:  Pollie Friar, RN 04/27/2015, 1:58 PM

## 2015-04-27 NOTE — Progress Notes (Signed)
STROKE TEAM PROGRESS NOTE  [name] is an 54 y.o. 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 Since that date he has had chronic left arm and leg weakness. Patient went to rehab on that hospital visit and he states he as had chronic weakness of left arm and contractions of his left hand. Today EMS was called to house due to patient waking up with what he felt was increased weakness on the left side. Code stroke was called but cancled on arrival as he has chronic left sided weakness and his LSN was last night. Patients history is ever evolving and continues to change. No clear history is obtainable.  Date last known well: Date: 04/25/2015 Time last known well: Unable to determine tPA Given: No: out of window and minimal symptoms.   SUBJECTIVE (INTERVAL HISTORY) No family is at the bedside.  Overall he feels his condition is stable. He denies any recent falls or neck injury or pain. He denies any facial droop, slurred speech and headache or any prior history of strokes or TIAs   OBJECTIVE Temp:  [98.1 F (36.7 C)-98.7 F (37.1 C)] 98.2 F (36.8 C) (10/04 0944) Pulse Rate:  [65-92] 68 (10/04 0944) Cardiac Rhythm:  [-] Normal sinus rhythm (10/03 1900) Resp:  [14-18] 16 (10/04 0944) BP: (124-181)/(60-88) 131/64 mmHg (10/04 0944) SpO2:  [90 %-100 %] 97 % (10/04 0944)  CBC:  Recent Labs Lab 04/26/15 1151 04/26/15 1156  WBC 12.3*  --   NEUTROABS 7.4  --   HGB 14.0 16.0  HCT 41.5 47.0  MCV 83.2  --   PLT 203  --     Basic Metabolic Panel:  Recent Labs Lab 04/26/15 1151 04/26/15 1156 04/27/15 0635  NA 133* 135 135  K 4.8 4.7 5.0  CL 102 101 103  CO2 21*  --  27  GLUCOSE 126* 128* 111*  BUN 18 22* 18  CREATININE 1.29* 1.30* 1.20  CALCIUM 9.3  --  9.0    Lipid Panel:    Component Value Date/Time   CHOL 222* 03/15/2015 1501   TRIG 85 03/15/2015 1501   HDL 55 03/15/2015 1501   CHOLHDL  4.0 03/15/2015 1501   VLDL 17 03/15/2015 1501   LDLCALC 150* 03/15/2015 1501   HgbA1c:  Lab Results  Component Value Date   HGBA1C 8.0 03/15/2015   Urine Drug Screen:    Component Value Date/Time   LABOPIA NONE DETECTED 04/26/2015 1428   COCAINSCRNUR POSITIVE* 04/26/2015 1428   LABBENZ NONE DETECTED 04/26/2015 1428   AMPHETMU NONE DETECTED 04/26/2015 1428   THCU NONE DETECTED 04/26/2015 1428   LABBARB NONE DETECTED 04/26/2015 1428      IMAGING  Ct Angio Head W/cm &/or Wo Cm 04/27/2015    1. No large or proximal arterial branch occlusion within the intracranial circulation. 2. Atheromatous plaque within the cavernous/supraclinoid ICAs bilaterally with associated moderate stenosis.    Ct Head Wo Contrast 04/26/2015    1. No acute intracranial abnormality. 2. Stable generalized cerebral volume loss. 3. 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   Mr Brain Wo Contrast 04/27/2015   1. No acute intracranial infarct or other process identified. 2. Congenital absence of the septum pellucidum. 3. Stable moderate generalized cerebral atrophy. 4. Multiple probable sebaceous cysts within the scalp.    Mr Cervical Spine W Wo Contrast 04/27/2015  1. 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 Otherwise normal appearance of the cervical spinal cord. 2. Left paracentral/foraminal disc osteophyte complex at C5-6 with resultant moderate to severe left foraminal stenosis. Left foraminal narrowing is most severe at this level. 3. Additional multilevel degenerative spondylolysis as above with resultant multilevel foraminal narrowing. No severe canal stenosis within the cervical spine.   Dg Chest Port 1 View 04/26/2015    Mild bibasilar atelectasis noted.  Cardiomegaly seen.     Dg Foot 2 Views Left 04/26/2015   No acute finding. No  evidence of fracture, dislocation or osteomyelitis.  Mild degenerative/ post traumatic changes at the talonavicular joint.    PHYSICAL EXAM Middle-aged African-American male currently not in distress. . Afebrile. Head is nontraumatic. Neck is supple without bruit.    Cardiac exam no murmur or gallop. Lungs are clear to auscultation. Distal pulses are well felt. Neurological Exam :  Awake alert oriented 3. Speech is fluent without dysarthria or aphasia. Can follow 3 step commands very well. Pupils are equal reactive extraocular movements are full range without nystagmus. Fundi were not visualized. Face is symmetric without weakness. Tongue is midline. Motor system exam reveals mild spastic left hemiparesis 4/5 strength proximally as well as distally. Weakness of left grip and intrinsic hand muscles. Tone is increased more in the left upper and lower extremity than the right. Deep tendon reflexes are 3+ brisk on the left side and 2+ brisk on the right side. Bilateral plantar  elicitation  leads to withdrawal response. Sensation appears intact bilaterally. Coordination is slow on the left. Gait was not tested. ASSESSMENT/PLAN [name] is a 54 y.o. male with history of HTN, DM, smoking, cocaine abuse, cord edema at the C5-6 left thought to be due to acute cord injury 12/2013. Since that date he has had chronic left arm and leg weakness presenting with left arm and leg weakness. He did not receive IV t-PA due to delay in arrival, minimal symptoms.   Pre-existing L weakness/spasticity, not related to new stroke or spinal disease, etiology unclear  MRI  No acute stroke  CTA head  No large vessel stenosis  C spine without new/acute findings  Diet heart healthy/carb modified Room service appropriate?: Yes; Fluid consistency:: Thin  aspirin 81 mg orally every day prior to admission, now on no antithrombotic  Ongoing aggressive stroke risk factor management  Therapy recommendations:  pending    Disposition:  pending   Nothing further to add from stroke standpoint. Will sign off. No follow up needed.  Hypertension  Stable  Hyperlipidemia  Home meds:  lipitor 40,  resumed in hospital  Continue statin at discharge  Diabetes  HgbA1c in Aug 8.0  Uncontrolled  Other Stroke Risk Factors  Cigarette smoker, advised to stop smoking  ETOH use  Cociane positive this admission   Hospital day #   [place] for Pager information 04/27/2015 11:45 AM  I have personally examined this patient, reviewed notes, independently viewed imaging studies, participated in medical decision making and plan of care. I have made any additions or clarifications directly to the above note. Agree with note above. He presented with subjective worsening of existing left-sided weakness of unclear etiology. New right brain stroke would be unlikely given negative brain imaging study and cervical spine imaging also shows no acute findings. He remains at  risk for neurological worsening and needs ongoing evaluation and physical and as well as occupational therapy eval and home safety assessment.  Antony Contras, MD Medical Director [place] Pager: [phone] 04/27/2015 2:13 PM    To contact Stroke Continuity provider, please refer to [email or web address] After hours, contact General Neurology

## 2015-04-27 NOTE — Discharge Summary (Signed)
PATIENT DETAILS Name: [name] Age: 54 y.o. Sex: male Date of Birth: Jan 08, 1961 MRN: [id number]. Admitting Physician: Reyne Dumas, MD KWE:[id number], Lennox Laity, MD  Admit Date: 04/26/2015 Discharge date: 04/27/2015  Recommendations for Outpatient Follow-up:  1. Age appropriate Gen. health maintenance   PRIMARY DISCHARGE DIAGNOSIS:  Principal Problem:   Acute left-sided weakness Active Problems:   Diabetes mellitus, type 2 (HCC)   Hyperlipidemia associated with type 2 diabetes mellitus (HCC)   Essential hypertension   Left-sided weakness   Callus of foot   Peripheral neuropathy (HCC)   Constipation      PAST MEDICAL HISTORY: Past Medical History  Diagnosis Date  . Diabetes mellitus   . Hypertension   . 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 Cocaine abuse   . Chronic pain   . Alcohol abuse   . Tobacco abuse   . Gait instability   . Chronic arm pain   . Chronic leg pain   . Peripheral edema     chronic LUE    DISCHARGE MEDICATIONS: Current Discharge Medication List    CONTINUE these medications which have CHANGED   Details  metFORMIN (GLUCOPHAGE) 1000 MG tablet Take 1 tablet (1,000 mg total) by mouth 2 (two) times daily with a meal. Resume on 04/29/15.   Associated Diagnoses: Type 2 diabetes mellitus with hyperglycemia ([place])      CONTINUE these medications which have NOT CHANGED   Details  atorvastatin (LIPITOR) 40 MG tablet Take 1 tablet (40 mg total) by mouth daily. Qty: 30 tablet, Refills: 5   Associated Diagnoses: Type 2 diabetes mellitus with hyperglycemia (HCC)    gabapentin (NEURONTIN) 300 MG capsule Take 1 capsule (300 mg total) by mouth 3 (three) times daily. Qty: 90 capsule, Refills: 3   Associated Diagnoses: Other specified diabetes mellitus without complications (HCC)    glipiZIDE (GLUCOTROL) 10 MG tablet Take 1 tablet (10 mg total) by mouth 2 (two) times daily before a meal. Qty: 60 tablet, Refills: 3   Associated Diagnoses: Other specified diabetes mellitus without complications (HCC)    glucose monitoring kit (FREESTYLE) monitoring kit 1 each by Does not apply route as needed for other. Dispense any model that is covered- dispense testing supplies for Q AC/ HS accuchecks- 1 month supply with one refil. Qty: 1 each, Refills: 1   Associated Diagnoses: Other specified diabetes mellitus without complications (HCC)    lisinopril (PRINIVIL,ZESTRIL) 20 MG tablet Take 1 tablet (20 mg total) by mouth daily. Qty: 30 tablet, Refills: 5   Associated Diagnoses: Essential hypertension    terbinafine (LAMISIL AT) 1 % cream Apply 1 application topically 2 (two) times daily. Qty: 30 g, Refills: 0   Associated Diagnoses: Tinea pedis of both feet    aspirin EC 81 MG EC tablet Take 1 tablet (81 mg total) by mouth daily.      STOP taking these medications     carbamide peroxide (DEBROX) 6.5 % otic solution      doxycycline (VIBRA-TABS) 100 MG tablet         ALLERGIES:  No Known Allergies  BRIEF HPI:  See H&P, Labs, Consult and Test reports for all details in brief, patient was admitted for evaluation of left-sided weakness.  CONSULTATIONS:   neurology  PERTINENT RADIOLOGIC STUDIES: Ct Angio Head W/cm &/or Wo Cm  04/27/2015   CLINICAL DATA:  Initial valuation for acute left-sided weakness.  EXAM: CT ANGIOGRAPHY HEAD  TECHNIQUE: Multidetector CT imaging of  the head was performed using the standard protocol during bolus administration of intravenous contrast. Multiplanar CT image reconstructions and MIPs were obtained to evaluate the vascular anatomy.  CONTRAST:  163mL OMNIPAQUE IOHEXOL 350 MG/ML SOLN  COMPARISON:  Prior CT from earlier the same day appear  FINDINGS: CTA HEAD  Anterior circulation: Visualized distal cervical segments of the internal carotid arteries are patent with antegrade flow. The petrous segments are well opacified. Scattered calcified atheromatous plaque present within the  cavernous/ supraclinoid ICAs bilaterally with secondary moderate narrowing. A1 segments, anterior communicating artery, and anterior cerebral arteries patent bilaterally.  M1 segments patent. MCA bifurcations normal. Distal MCA branches opacified and symmetric bilaterally.  Posterior circulation: Vertebral arteries patent to the vertebrobasilar junction. Posterior inferior cerebellar arteries patent proximally. Basilar artery widely patent. Superior cerebellar arteries patent proximally. Both posterior cerebral arteries arise from the basilar artery and are opacified to their distal aspects.  Venous sinuses: Venous sinuses are grossly patent without acute abnormality.  Anatomic variants: No anatomic variant.  No aneurysm.  Delayed phase:No abnormal enhancement on delayed sequence. Multiple probable sebaceous cyst noted within the scalp.  IMPRESSION: 1. No large or proximal arterial branch occlusion within the intracranial circulation. 2. Atheromatous plaque within the cavernous/supraclinoid ICAs bilaterally with associated moderate stenosis.   Electronically Signed   By: Jeannine Boga M.D.   On: 04/27/2015 01:38   Ct Head Wo Contrast  04/26/2015   CLINICAL DATA:  CVA. Acute on chronic left-sided weakness and numbness.  EXAM: CT HEAD WITHOUT CONTRAST  TECHNIQUE: Contiguous axial images were obtained from the base of the skull through the vertex without intravenous contrast.  COMPARISON:  12/26/2013 head CT.  FINDINGS: No evidence of parenchymal hemorrhage or extra-axial fluid collection. No mass lesion, mass effect, or midline shift.  No CT evidence of acute infarction.  There is stable mild generalized cerebral volume loss. There is intracranial atherosclerotic internal carotid artery calcification. 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 There is stable chronic atrophy of the corpus  callosum. No ventriculomegaly.  The visualized paranasal sinuses are essentially clear. The mastoid air cells are unopacified. No evidence of calvarial fracture. There are multiple scattered superficial scalp lesions throughout the bilateral scalp of variable density, largest 2.0 cm in the right posterior scalp, most in keeping sebaceous cysts.  IMPRESSION: 1. No acute intracranial abnormality. 2. Stable generalized cerebral volume loss. 3. 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  These results were called by telephone at the time of interpretation on 04/26/2015 at 12:07 pm to Dr. Armida Sans, who verbally acknowledged these results.   Electronically Signed   By: Ilona Sorrel M.D.   On: 04/26/2015 12:14   Mr Brain Wo Contrast  04/27/2015   CLINICAL DATA:  Initial evaluation for are increase left sided weakness.  EXAM: MRI HEAD WITHOUT CONTRAST  TECHNIQUE: Multiplanar, multiecho pulse sequences of the brain and surrounding structures were obtained without intravenous contrast.  COMPARISON:  Prior CTs from 04/26/2015 as well as MRI from 12/27/2013.  FINDINGS: Moderate generalized cerebral atrophy present. No significant white matter disease for patient age.  No abnormal foci of restricted diffusion to suggest acute intracranial infarct. Gray-white matter differentiation maintained. Normal intravascular flow voids are preserved. No acute or chronic intracranial hemorrhage.  No mass lesion, midline shift, or mass effect. Congenital absence of the septum pellucidum noted. Ventricular prominence is stable without hydrocephalus. No extra-axial  fluid collection.  Craniocervical junction within normal limits. Pituitary gland normal. No acute abnormality about the orbits.  Mild mucosal thickening within the ethmoidal air cells. Paranasal sinuses are otherwise clear. No mastoid effusion. Inner ear structures normal.  Multiple probable sebaceous cyst noted within the  scalp. Bone marrow signal intensity within normal limits.  IMPRESSION: 1. No acute intracranial infarct or other process identified. 2. Congenital absence of the septum pellucidum. 3. Stable moderate generalized cerebral atrophy. 4. Multiple probable sebaceous cysts within the scalp.   Electronically Signed   By: Jeannine Boga M.D.   On: 04/27/2015 01:56   Mr Cervical Spine W Wo Contrast  04/27/2015   CLINICAL DATA:  Initial evaluation for progress of left-sided weakness.  EXAM: MRI CERVICAL SPINE WITHOUT AND WITH CONTRAST  TECHNIQUE: Multiplanar and multiecho pulse sequences of the cervical spine, to include the craniocervical junction and cervicothoracic junction, were obtained according to standard protocol without and with intravenous contrast.  CONTRAST:  25mL MULTIHANCE GADOBENATE DIMEGLUMINE 529 MG/ML IV SOLN  COMPARISON:  Prior MRI from 12/29/2013.  FINDINGS: Visualized portions of the brain and posterior fossa are unremarkable. Craniocervical junction widely patent.  There is straightening was gentle reversal of the normal cervical lordosis with apex at C5-6. No listhesis. Vertebral body heights maintained. No fracture. Chronic endplate changes about the C6-7 and C7-T1 levels. Signal intensity within the vertebral body bone marrow is otherwise unremarkable. No marrow edema. No focal osseous lesion. No abnormal enhancement.  There is a focus of abnormal T2 signal intensity within the left lateral aspect of the cord at the level of C5-6 (series 4, image 9) on sagittal STIR sequence, series 6, image 17 on axial sequence). Finding most consistent with a focus of myelomalacia, and is seen in the area of previously seen spinal cord injury. No other cord signal abnormality. No abnormal enhancement within the cord itself.  Visualized paraspinous soft tissues demonstrate no acute abnormality.  C2-C3: Mild bilateral uncovertebral hypertrophy. No significant stenosis.  C3-C4: Mild right-sided uncovertebral  hypertrophy with mild diffuse annular disc bulge. Mild right-sided facet arthrosis. There is resultant mild bilateral foraminal narrowing, slightly worse on the right. No posterior disc bulge mildly flattens the ventral thecal sac without significant canal stenosis.  C4-C5: Mild bilateral uncovertebral hypertrophy and facet arthrosis, slightly worse on the left. Broad-based central disc osteophyte complex mildly indents the ventral thecal sac and results in mild canal narrowing. There is very mild left-sided foraminal stenosis as well.  C5-C6: Broad-based left paracentral/foraminal disc osteophyte complex flattens the ventral thecal sac and results in mild canal stenosis. There is mild flattening of the left hemi cord at this level, similar from prior. Small focus of myelomalacia as above. There is mild right foraminal narrowing. Moderate to severe left foraminal stenosis present.  C6-C7: Mild diffuse disc bulge with bilateral uncovertebral hypertrophy. Mild effacement of the ventral thecal sac without significant stenosis. There is mild bilateral foraminal narrowing.  C7-T1: Mild diffuse annular disc bulge with bilateral uncovertebral spurring, slightly greater on the left. No significant canal stenosis. Moderate bilateral foraminal narrowing, slightly worse on the left.  Minimal annular disc bulge present at T1-2 without stenosis. Remainder of the upper thoracic spine demonstrates no acute abnormality.  IMPRESSION: 1. 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 Otherwise normal appearance of the cervical spinal cord. 2. Left paracentral/foraminal disc osteophyte complex at C5-6 with resultant moderate  to severe left foraminal stenosis. Left foraminal narrowing is most severe at this level. 3. Additional multilevel degenerative spondylolysis as above with resultant multilevel foraminal narrowing. No severe  canal stenosis within the cervical spine.   Electronically Signed   By: Jeannine Boga M.D.   On: 04/27/2015 03:20   Dg Chest Port 1 View  04/26/2015   CLINICAL DATA:  Acute onset of worsening weakness. Initial encounter.  EXAM: PORTABLE CHEST 1 VIEW  COMPARISON:  Chest radiograph performed 12/09/2008  FINDINGS: The lungs are well-aerated. Mild bibasilar atelectasis is noted. There is no evidence of pleural effusion or pneumothorax.  The cardiomediastinal silhouette is enlarged. No acute osseous abnormalities are seen.  IMPRESSION: Mild bibasilar atelectasis noted.  Cardiomegaly seen.   Electronically Signed   By: Garald Balding M.D.   On: 04/26/2015 22:27   Dg Foot 2 Views Left  04/26/2015   CLINICAL DATA:  Swelling and erythema of the foot without known injury.  EXAM: LEFT FOOT - 2 VIEW  COMPARISON:  None.  FINDINGS: No evidence of fracture or dislocation. There is mild arthritis at the talonavicular joint with small periarticular calcifications that could relate to old trauma with capsular avulsion is a. there is a small plantar calcaneal spur.  IMPRESSION: No acute finding. No evidence of fracture, dislocation or osteomyelitis.  Mild degenerative/ post traumatic changes at the talonavicular joint.   Electronically Signed   By: Nelson Chimes M.D.   On: 04/26/2015 16:20     PERTINENT LAB RESULTS: CBC:  Recent Labs  04/26/15 1151 04/26/15 1156  WBC 12.3*  --   HGB 14.0 16.0  HCT 41.5 47.0  PLT 203  --    CMET CMP     Component Value Date/Time   NA 135 04/27/2015 0635   K 5.0 04/27/2015 0635   CL 103 04/27/2015 0635   CO2 27 04/27/2015 0635   GLUCOSE 111* 04/27/2015 0635   BUN 18 04/27/2015 0635   CREATININE 1.20 04/27/2015 0635   CALCIUM 9.0 04/27/2015 0635   PROT 7.1 04/26/2015 1151   ALBUMIN 3.9 04/26/2015 1151   AST 19 04/26/2015 1151   ALT 18 04/26/2015 1151   ALKPHOS 54 04/26/2015 1151   BILITOT 0.9 04/26/2015 1151   GFRNONAA >60 04/27/2015 0635   GFRAA >60  04/27/2015 0635    GFR CrCl cannot be calculated (Unknown ideal weight.). No results for input(s): LIPASE, AMYLASE in the last 72 hours. No results for input(s): CKTOTAL, CKMB, CKMBINDEX, TROPONINI in the last 72 hours. Invalid input(s): POCBNP No results for input(s): DDIMER in the last 72 hours. No results for input(s): HGBA1C in the last 72 hours. No results for input(s): CHOL, HDL, LDLCALC, TRIG, CHOLHDL, LDLDIRECT in the last 72 hours.  Recent Labs  04/26/15 1930  TSH 1.665    Recent Labs  04/26/15 1930  VITAMINB12 277   Coags:  Recent Labs  04/26/15 1151  INR 1.10   Microbiology: No results found for this or any previous visit (from the past 240 hour(s)).   BRIEF HOSPITAL COURSE:   Principal Problem: Acute left-sided weakness: Transient-now back to baseline. Has chronic left-sided weakness from a prior spinal cord injury. MRI brain and MRI C-spine negative for any acute findings. Seen by physical therapy, no further recommendations. Neurology does not recommend any further inpatient investigations at all. Suspect some functional component. Okay for discharge, continue aspirin  Active Problems: Diabetes mellitus, type 2: Continue glipizide, resume metformin in 48 hours. Discussed with patient.  Dyslipidemia: Continue  Lipitor  Peripheral neuropathy: Continue Neurontin  TODAY-DAY OF DISCHARGE:  Subjective:   Dvontae Dabbs today has no headache,no chest abdominal pain,no new weakness tingling or numbness, feels much better wants to go home today.  Objective:   Blood pressure 138/61, pulse 70, temperature 97.8 F (36.6 C), temperature source Oral, resp. rate 16, SpO2 97 %.  Intake/Output Summary (Last 24 hours) at 04/27/15 1520 Last data filed at 04/27/15 1000  Gross per 24 hour  Intake      0 ml  Output   1850 ml  Net  -1850 ml   There were no vitals filed for this visit.  Exam Awake Alert, Oriented *3, No new F.N deficits, Normal  affect [place].AT,PERRAL Supple Neck,No JVD, No cervical lymphadenopathy appriciated.  Symmetrical Chest wall movement, Good air movement bilaterally, CTAB RRR,No Gallops,Rubs or new Murmurs, No Parasternal Heave +ve B.Sounds, Abd Soft, Non tender, No organomegaly appriciated, No rebound -guarding or rigidity. No Cyanosis, Clubbing or edema, No new Rash or bruise  DISCHARGE CONDITION: Stable  DISPOSITION: Home  DISCHARGE INSTRUCTIONS:    Activity:  As tolerated with Full fall precautions use walker/cane & assistance as needed  Get Medicines reviewed and adjusted: Please take all your medications with you for your next visit with your Primary MD  Please request your Primary MD to go over all hospital tests and procedure/radiological results at the follow up, please ask your Primary MD to get all Hospital records sent to his/her office.  If you experience worsening of your admission symptoms, develop shortness of breath, life threatening emergency, suicidal or homicidal thoughts you must seek medical attention immediately by calling 911 or calling your MD immediately  if symptoms less severe.  You must read complete instructions/literature along with all the possible adverse reactions/side effects for all the Medicines you take and that have been prescribed to you. Take any new Medicines after you have completely understood and accpet all the possible adverse reactions/side effects.   Do not drive when taking Pain medications.   Do not take more than prescribed Pain, Sleep and Anxiety Medications  Special Instructions: If you have smoked or chewed Tobacco  in the last 2 yrs please stop smoking, stop any regular Alcohol  and or any Recreational drug use.  Wear Seat belts while driving.  Please note  You were cared for by a hospitalist during your hospital stay. Once you are discharged, your primary care physician will handle any further medical issues. Please note that NO REFILLS for any  discharge medications will be authorized once you are discharged, as it is imperative that you return to your primary care physician (or establish a relationship with a primary care physician if you do not have one) for your aftercare needs so that they can reassess your need for medications and monitor your lab values.  Diet recommendation: Diabetic Diet Heart Healthy diet  Discharge Instructions    Call MD for:  persistant dizziness or light-headedness    Complete by:  As directed      Diet - low sodium heart healthy    Complete by:  As directed      Diet Carb Modified    Complete by:  As directed      Increase activity slowly    Complete by:  As directed            Follow-up Information    Schedule an appointment as soon as possible for a visit with Minerva Ends, MD.   Specialty:  Family Medicine   Contact information:   [address] [place] [address] [phone]         Total Time spent on discharge equals 25 minutes.  SignedOren Binet 04/27/2015 3:20 PM

## 2015-04-28 NOTE — Progress Notes (Signed)
Patient discharged home at 2235 with all belongings accompanied by family. All discharge instructions given.

## 2015-05-05 ENCOUNTER — Ambulatory Visit: Payer: Medicaid Other | Attending: Family Medicine | Admitting: Family Medicine

## 2015-05-05 ENCOUNTER — Encounter: Payer: Self-pay | Admitting: Family Medicine

## 2015-05-05 ENCOUNTER — Telehealth: Payer: Self-pay | Admitting: Clinical

## 2015-05-05 ENCOUNTER — Telehealth: Payer: Self-pay | Admitting: Family Medicine

## 2015-05-05 ENCOUNTER — Ambulatory Visit (INDEPENDENT_AMBULATORY_CARE_PROVIDER_SITE_OTHER): Payer: Medicaid Other | Admitting: Podiatry

## 2015-05-05 ENCOUNTER — Encounter: Payer: Self-pay | Admitting: Podiatry

## 2015-05-05 VITALS — BP 155/89 | HR 63 | Temp 98.0°F | Resp 18 | Ht 75.0 in | Wt 265.0 lb

## 2015-05-05 DIAGNOSIS — E785 Hyperlipidemia, unspecified: Secondary | ICD-10-CM | POA: Insufficient documentation

## 2015-05-05 DIAGNOSIS — B351 Tinea unguium: Secondary | ICD-10-CM

## 2015-05-05 DIAGNOSIS — I10 Essential (primary) hypertension: Secondary | ICD-10-CM | POA: Diagnosis not present

## 2015-05-05 DIAGNOSIS — R531 Weakness: Secondary | ICD-10-CM

## 2015-05-05 DIAGNOSIS — K5901 Slow transit constipation: Secondary | ICD-10-CM

## 2015-05-05 DIAGNOSIS — Z0000 Encounter for general adult medical examination without abnormal findings: Secondary | ICD-10-CM

## 2015-05-05 DIAGNOSIS — E1169 Type 2 diabetes mellitus with other specified complication: Secondary | ICD-10-CM

## 2015-05-05 DIAGNOSIS — M6289 Other specified disorders of muscle: Secondary | ICD-10-CM | POA: Insufficient documentation

## 2015-05-05 DIAGNOSIS — E1100 Type 2 diabetes mellitus with hyperosmolarity without nonketotic hyperglycemic-hyperosmolar coma (NKHHC): Secondary | ICD-10-CM

## 2015-05-05 DIAGNOSIS — L84 Corns and callosities: Secondary | ICD-10-CM | POA: Diagnosis not present

## 2015-05-05 DIAGNOSIS — M79676 Pain in unspecified toe(s): Secondary | ICD-10-CM

## 2015-05-05 LAB — GLUCOSE, POCT (MANUAL RESULT ENTRY): POC GLUCOSE: 114 mg/dL — AB (ref 70–99)

## 2015-05-05 MED ORDER — LISINOPRIL 20 MG PO TABS: 20.0000 mg | ORAL_TABLET | Freq: Every day | ORAL | Status: DC

## 2015-05-05 MED ORDER — DOCUSATE SODIUM 100 MG PO CAPS: 100.0000 mg | ORAL_CAPSULE | Freq: Two times a day (BID) | ORAL | Status: DC

## 2015-05-05 NOTE — Telephone Encounter (Signed)
F/u with patient about referral for shower chair. Pt says he will not be able to get to a medical supply store, and would prefer to have it sent to his home address; pt aware it may take at least 10 business days, if DME company agrees to ship to his address.  [facility] and [facility] have all said they will not deliver most DMEs. [facility] says they will consider shipping,  faxed order for shower chair to [facility], awaiting outcome response.

## 2015-05-05 NOTE — Patient Instructions (Signed)
Diabetes and Foot Care Diabetes may cause you to have problems because of poor blood supply (circulation) to your feet and legs. This may cause the skin on your feet to become thinner, break easier, and heal more slowly. Your skin may become dry, and the skin may peel and crack. You may also have nerve damage in your legs and feet causing decreased feeling in them. You may not notice minor injuries to your feet that could lead to infections or more serious problems. Taking care of your feet is one of the most important things you can do for yourself.  HOME CARE INSTRUCTIONS  Wear shoes at all times, even in the house. Do not go barefoot. Bare feet are easily injured.  Check your feet daily for blisters, cuts, and redness. If you cannot see the bottom of your feet, use a mirror or ask someone for help.  Wash your feet with warm water (do not use hot water) and mild soap. Then pat your feet and the areas between your toes until they are completely dry. Do not soak your feet as this can dry your skin.  Apply a moisturizing lotion or petroleum jelly (that does not contain alcohol and is unscented) to the skin on your feet and to dry, brittle toenails. Do not apply lotion between your toes.  Trim your toenails straight across. Do not dig under them or around the cuticle. File the edges of your nails with an emery board or nail file.  Do not cut corns or calluses or try to remove them with medicine.  Wear clean socks or stockings every day. Make sure they are not too tight. Do not wear knee-high stockings since they may decrease blood flow to your legs.  Wear shoes that fit properly and have enough cushioning. To break in new shoes, wear them for just a few hours a day. This prevents you from injuring your feet. Always look in your shoes before you put them on to be sure there are no objects inside.  Do not cross your legs. This may decrease the blood flow to your feet.  If you find a minor scrape,  cut, or break in the skin on your feet, keep it and the skin around it clean and dry. These areas may be cleansed with mild soap and water. Do not cleanse the area with peroxide, alcohol, or iodine.  When you remove an adhesive bandage, be sure not to damage the skin around it.  If you have a wound, look at it several times a day to make sure it is healing.  Do not use heating pads or hot water bottles. They may burn your skin. If you have lost feeling in your feet or legs, you may not know it is happening until it is too late.  Make sure your health care provider performs a complete foot exam at least annually or more often if you have foot problems. Report any cuts, sores, or bruises to your health care provider immediately. SEEK MEDICAL CARE IF:   You have an injury that is not healing.  You have cuts or breaks in the skin.  You have an ingrown nail.  You notice redness on your legs or feet.  You feel burning or tingling in your legs or feet.  You have pain or cramps in your legs and feet.  Your legs or feet are numb.  Your feet always feel cold. SEEK IMMEDIATE MEDICAL CARE IF:   There is increasing redness,   swelling, or pain in or around a wound.  There is a red line that goes up your leg.  Pus is coming from a wound.  You develop a fever or as directed by your health care provider.  You notice a bad smell coming from an ulcer or wound.   This information is not intended to replace advice given to you by your health care provider. Make sure you discuss any questions you have with your health care provider.   Document Released: 07/07/2000 Document Revised: 03/12/2013 Document Reviewed: 12/17/2012 Elsevier Interactive Patient Education 2016 [employer]  

## 2015-05-05 NOTE — Patient Instructions (Signed)
[name] was seen today for hospitalization follow-up.  Diagnoses and all orders for this visit:  Type 2 diabetes mellitus with hyperosmolarity without coma, without long-term current use of insulin (HCC) -     POCT glucose (manual entry)  Essential hypertension -     lisinopril (PRINIVIL,ZESTRIL) 20 MG tablet; Take 1 tablet (20 mg total) by mouth daily.  Acute left-sided weakness -     Ambulatory referral to Physical Therapy -     Shower chair  Slow transit constipation -     docusate sodium (COLACE) 100 MG capsule; Take 1 capsule (100 mg total) by mouth 2 (two) times daily.

## 2015-05-05 NOTE — Progress Notes (Signed)
Patient ID: [name], male   DOB: [birth year], 54 y.o.   MRN: [id number]   Subjective:  Patient ID: [name], male    DOB: June 11, 1961  Age: 54 y.o. MRN: [id number]  CC: Hospitalization Follow-up   HPI [name] presents for    1. HFU weakness: admitted from 10/3-10/10/2014. Weakness with on L side. Has improved. He walks with a cane. Gait is slow and shuffling. He denies falls. He is compliant with diabetes and HTN medication. He continues to smoke. He continues to use cocaine and had UDS cocaine + on 04/26/2015.   Social History  Substance Use Topics  . Smoking status: Current Every Day Smoker -- 0.25 packs/day    Types: Cigarettes  . Smokeless tobacco: Never Used  . Alcohol Use: Yes     Comment: occassional   Outpatient Prescriptions Prior to Visit  Medication Sig Dispense Refill  . atorvastatin (LIPITOR) 40 MG tablet Take 1 tablet (40 mg total) by mouth daily. 30 tablet 5  . gabapentin (NEURONTIN) 300 MG capsule Take 1 capsule (300 mg total) by mouth 3 (three) times daily. 90 capsule 3  . glipiZIDE (GLUCOTROL) 10 MG tablet Take 1 tablet (10 mg total) by mouth 2 (two) times daily before a meal. 60 tablet 3  . glucose monitoring kit (FREESTYLE) monitoring kit 1 each by Does not apply route as needed for other. Dispense any model that is covered- dispense testing supplies for Q AC/ HS accuchecks- 1 month supply with one refil. 1 each 1  . lisinopril (PRINIVIL,ZESTRIL) 20 MG tablet Take 1 tablet (20 mg total) by mouth daily. 30 tablet 5  . metFORMIN (GLUCOPHAGE) 1000 MG tablet Take 1 tablet (1,000 mg total) by mouth 2 (two) times daily with a meal. Resume on 04/29/15.    . terbinafine (LAMISIL AT) 1 % cream Apply 1 application topically 2 (two) times daily. 30 g 0  . aspirin EC 81 MG EC tablet Take 1 tablet (81 mg total) by mouth daily. (Patient not taking: Reported on 05/05/2015)     No facility-administered medications prior to visit.    ROS Review of Systems    Constitutional: Negative for fever, chills, fatigue and unexpected weight change.  Eyes: Negative for visual disturbance.  Respiratory: Negative for cough and shortness of breath.   Cardiovascular: Negative for chest pain, palpitations and leg swelling.  Gastrointestinal: Positive for constipation. Negative for nausea, vomiting, abdominal pain, diarrhea and blood in stool.  Endocrine: Negative for polydipsia, polyphagia and polyuria.  Musculoskeletal: Positive for gait problem. Negative for myalgias, back pain, arthralgias and neck pain.  Skin: Negative for rash.  Allergic/Immunologic: Negative for immunocompromised state.  Neurological: Negative for numbness.  Hematological: Negative for adenopathy. Does not bruise/bleed easily.  Psychiatric/Behavioral: Negative for suicidal ideas, sleep disturbance and dysphoric mood. The patient is not nervous/anxious.   GAD-7: score of 2. 1-2,3   Objective:  BP 155/89 mmHg  Pulse 63  Temp(Src) 98 F (36.7 C) (Oral)  Resp 18  Ht '6\' 3"'  (1.905 m)  Wt 265 lb (120.203 kg)  BMI 33.12 kg/m2  SpO2 99%  BP/Weight 05/05/2015 04/27/2015 12/09/8414  Systolic BP 606 301 601  Diastolic BP 89 71 91  Wt. (Lbs) 265 - 260  BMI 33.12 - 32.5    Physical Exam  Constitutional: He appears well-developed and well-nourished. No distress.  HENT:  Head: Normocephalic and atraumatic.  Neck: Normal range of motion. Neck supple.  Cardiovascular: Normal rate, regular rhythm, normal heart sounds and  intact distal pulses.   Pulmonary/Chest: Effort normal and breath sounds normal.  Musculoskeletal: He exhibits no edema.  Neurological: He is alert. Gait abnormal.  Slow gait with short steps   Skin: Skin is warm and dry. No rash noted. No erythema.  Psychiatric: He has a normal mood and affect.   Lab Results  Component Value Date   HGBA1C 8.0 03/15/2015   CBG 114  Assessment & Plan:   Problem List Items Addressed This Visit    Constipation (Chronic)   Relevant  Medications   docusate sodium (COLACE) 100 MG capsule   Diabetes mellitus, type 2 (HCC) - Primary (Chronic)   Relevant Medications   lisinopril (PRINIVIL,ZESTRIL) 20 MG tablet   Other Relevant Orders   POCT glucose (manual entry) (Completed)   Essential hypertension (Chronic)   Relevant Medications   lisinopril (PRINIVIL,ZESTRIL) 20 MG tablet   Hyperlipidemia associated with type 2 diabetes mellitus (HCC) (Chronic)   Relevant Medications   lisinopril (PRINIVIL,ZESTRIL) 20 MG tablet   Left-sided weakness   Relevant Orders   Ambulatory referral to Physical Therapy   Shower chair    Other Visit Diagnoses    Healthcare maintenance        Relevant Orders    Flu Vaccine QUAD 36+ mos IM (Completed)       No orders of the defined types were placed in this encounter.    Follow-up: No Follow-up on file.   Boykin Nearing MD

## 2015-05-05 NOTE — Progress Notes (Signed)
HFU lt side of body numb  "Doing good now " Hx tobacco 10 cigarette per week  Pain scale # 0

## 2015-05-05 NOTE — Telephone Encounter (Signed)
[name] [facility] from [facility] wanting to let patients PCP know that he needs a DME request, needs a tub bench, and a rolling walker because of his instability and physical limitations.  Pt. Also needs a [facility] for prior approval for [facility] home services because patient is unable to check his blood sugar because of his limitation of his left side. Pt. Is not able to cook at home. The nurse also stated he needs 1 or 2 sessions of OT. [facility] is also going to start giving nutrition sessions to their patients. Pt. Would also need a referral to cardiology, neurology, and endocrinology. Please f/u  Nurse Theadora [phone]

## 2015-05-06 NOTE — Progress Notes (Signed)
Patient ID: [name], male   DOB: Mar 25, 1961, 54 y.o.   MRN: [id number]  Subjective: This patient presents for a scheduled visit complaining of painful toenails walking wearing shoes and requests nail debridement. Also patient does develop reactive callus on the left hallux which is to been debrided in the past  Objective: No open skin lesions noted bilaterally Bleeding callus left hallux remains closed after debridement The toenails are elongated, hypertrophic, incurvated, discolored and tender direct palpation 6-10  Assessment: Symptomatic onychomycoses 6-10 Pre-ulcerative callus Diabetic without complications  Plan: Debridement toenails 10 mechanically E without a bleeding Debride callus on left hallux which remains closed after debridement  Reappoint 10 weeks

## 2015-05-10 NOTE — Telephone Encounter (Signed)
Dorothy from [facility] called and requested to speak with pt.'s PCP. Please f/u

## 2015-05-12 NOTE — Telephone Encounter (Signed)
Called back to [facility] Left VM Will refer to endo and neurology Hold off on cards as patient is a cocaine user, and unless/until he stops using there is not much cardiology could add   Home health PT and OT ordered DME for tub bench and rolling walker ordered  Routed to [facility] for processing

## 2015-05-14 NOTE — Telephone Encounter (Signed)
[name] from [facility] returned PCP phone call. Please f/u

## 2015-05-17 NOTE — Telephone Encounter (Signed)
Unable to contact Earlie Server from [facility] mail not set up yet

## 2015-05-18 ENCOUNTER — Telehealth: Payer: Self-pay | Admitting: Clinical

## 2015-05-18 NOTE — Telephone Encounter (Signed)
Cindee Salt physical therapist from [facility] called stating he needs a verbal order from PCP stating that pt. Needs physical therapy twice a weeks for three weeks. Please f/u

## 2015-05-18 NOTE — Telephone Encounter (Addendum)
Pt says that [facility] says they need his DME order for shower chair and walker, along with clinical notes, faxed to them, prior to him receiving the equipment. Call to [facility] says they are unaware of the need for clinical notes to receive equipment at retail store, and will call back [place] at [phone] to inform [facility] of procedure.

## 2015-05-19 ENCOUNTER — Telehealth: Payer: Self-pay | Admitting: Clinical

## 2015-05-19 NOTE — Telephone Encounter (Signed)
LVM to return call.

## 2015-05-19 NOTE — Telephone Encounter (Signed)
Informed patient that the order has been faxed to [facility] for the rolling walker and shower seat, [facility] says they will do insurance verification and then contact Mr. Kaeser.

## 2015-05-21 ENCOUNTER — Ambulatory Visit: Payer: Medicaid Other | Admitting: Neurology

## 2015-05-25 NOTE — Telephone Encounter (Signed)
Erick PT from [facility] called stating he needs a letter confirm pt Need Physical therapy twice per week x 3 wks Pt insurance do not allowed tx due to [facility] with out letter from PCP Fax to [phone] [facility]

## 2015-05-26 ENCOUNTER — Encounter: Payer: Self-pay | Admitting: Clinical

## 2015-05-26 NOTE — Progress Notes (Signed)
[facility] representative called [facility] to say that Mr. Castilleja does not qualify for home health care; they say they have explained this to Mr. Bibbee, and that he understands that the only physical therapy that Medicaid will likely approve is the initial evaluation.

## 2015-05-27 NOTE — Telephone Encounter (Signed)
Letter written and placed in to be faxed.  

## 2015-06-07 ENCOUNTER — Ambulatory Visit (INDEPENDENT_AMBULATORY_CARE_PROVIDER_SITE_OTHER): Payer: Medicaid Other | Admitting: Neurology

## 2015-06-07 ENCOUNTER — Telehealth: Payer: Self-pay | Admitting: Clinical

## 2015-06-07 ENCOUNTER — Encounter: Payer: Self-pay | Admitting: Neurology

## 2015-06-07 VITALS — BP 150/84 | HR 69 | Ht 75.0 in | Wt 272.0 lb

## 2015-06-07 DIAGNOSIS — R269 Unspecified abnormalities of gait and mobility: Secondary | ICD-10-CM | POA: Insufficient documentation

## 2015-06-07 DIAGNOSIS — G919 Hydrocephalus, unspecified: Secondary | ICD-10-CM | POA: Diagnosis not present

## 2015-06-07 DIAGNOSIS — G609 Hereditary and idiopathic neuropathy, unspecified: Secondary | ICD-10-CM

## 2015-06-07 NOTE — Progress Notes (Signed)
PATIENT: [name] DOB: [birth year]  Chief Complaint  Patient presents with  . Left-sided weakness    Reports having a fall in June 2016 that caused a cervical injury.  Says he has been having worsening left-sided weakness since his accident.  He has had a cervial CT and MRI in the past.     HISTORICAL  [name] is a 54 year old right-handed male, seen in refer by his primary care physician Dr. Boykin Nearing in June 07 2015 for evaluation of left-sided weakness.  He had a history of hypertension, diabetes, smoking, cocaine abuse, history of cervical cord edema at left C5-6, thought due to fall injury in June 2015, since the event, he had chronic left arm and leg weakness,  at that time, he was intoxicated with alcohol, fell face down  He presented to the emergency room in October for some 2016 for worsening left arm and leg weakness, I have personally reviewed MRI of the brain, no acute intracranial abnormality, congenital absence of septum pellucidum, moderate generalized atrophy. 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  Review of laboratory, UDS was positive for cocaine,  Echocardiogram in 2015 showed ejection fraction 55-60%, mildly low B12 277, normal TSH, BMP, A1c was 8.0.   REVIEW OF SYSTEMS: Full 14 system review of systems performed and notable only for as above  ALLERGIES: No Known Allergies  HOME MEDICATIONS: Current Outpatient Prescriptions  Medication Sig Dispense Refill  . atorvastatin (LIPITOR) 40 MG tablet Take 1 tablet (40 mg total) by mouth daily. 30 tablet 5  . gabapentin (NEURONTIN) 300 MG capsule Take 1 capsule (300 mg total) by mouth 3 (three) times daily. 90 capsule 3  . glipiZIDE (GLUCOTROL) 10 MG tablet Take 1  tablet (10 mg total) by mouth 2 (two) times daily before a meal. 60 tablet 3  . glucose monitoring kit (FREESTYLE) monitoring kit 1 each by Does not apply route as needed for other. Dispense any model that is covered- dispense testing supplies for Q AC/ HS accuchecks- 1 month supply with one refil. 1 each 1  . lisinopril (PRINIVIL,ZESTRIL) 20 MG tablet Take 1 tablet (20 mg total) by mouth daily. 30 tablet 5  . metFORMIN (GLUCOPHAGE) 1000 MG tablet Take 1 tablet (1,000 mg total) by mouth 2 (two) times daily with a meal. Resume on 04/29/15.     No current facility-administered medications for this visit.    PAST MEDICAL HISTORY: Past Medical History  Diagnosis Date  . Diabetes mellitus   . Hypertension   . 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 Cocaine abuse   . Chronic pain   . Alcohol abuse   . Tobacco abuse   . Gait instability   . Chronic arm pain   . Chronic leg pain   . Peripheral edema     chronic LUE    PAST SURGICAL HISTORY: Past Surgical History  Procedure Laterality Date  . Left arm skin graft    . Left heart catheterization with coronary angiogram Bilateral 12/26/2013    Procedure: LEFT HEART CATHETERIZATION WITH CORONARY ANGIOGRAM;  Surgeon: Burnell Blanks, MD;  Location: [facility] CATH LAB;  Service: Cardiovascular;  Laterality: Bilateral;  . Cardiac catheterization  12/27/2013  . Cardiac catheterization  12/27/2013    Impression: 1.  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 Moderate disease in the small caliber intermediate branch, 3. Normal LV systolic function    FAMILY HISTORY: Family History  Problem Relation Age of Onset  . Hypertension Mother   . Diabetes Mother   . Cancer Mother     breast cancer   . Hypertension Father   . Heart disease Father   . Stroke Father   . Hypertension Sister     SOCIAL HISTORY:  Social History   Social History  . Marital Status: Divorced    Spouse Name: N/A  . Number of Children: 0  . Years of  Education: 10   Occupational History  . Unemployed    Social History Main Topics  . Smoking status: Current Every Day Smoker -- 0.25 packs/day    Types: Cigarettes  . Smokeless tobacco: Never Used  . Alcohol Use: 0.0 oz/week    0 Standard drinks or equivalent per week     Comment: occassional  . Drug Use: No     Comment: last use June 2015  . Sexual Activity: Not on file   Other Topics Concern  . Not on file   Social History Narrative   Lives at home alone.   Right-handed.   Occasional use.     PHYSICAL EXAM   Filed Vitals:   06/07/15 1000  BP: 150/84  Pulse: 69  Height: '6\' 3"'  (1.905 m)  Weight: 272 lb (123.378 kg)    Not recorded      Body mass index is 34 kg/(m^2).  PHYSICAL EXAMNIATION:  Gen: NAD, conversant, well nourised, obese, well groomed                     Cardiovascular: Regular rate rhythm, no peripheral edema, warm, nontender. Eyes: Conjunctivae clear without exudates or hemorrhage Neck: Supple, no carotid bruise. Pulmonary: Clear to auscultation bilaterally   NEUROLOGICAL EXAM:  MENTAL STATUS: Speech:    Speech is normal; fluent and spontaneous with normal comprehension.  Cognition:     Orientation to time, place and person     Normal recent and remote memory     Normal Attention span and concentration     Normal Language, naming, repeating,spontaneous speech     Fund of knowledge   CRANIAL NERVES: CN II: Visual fields are full to confrontation. Fundoscopic exam is normal with sharp discs and no vascular changes. Pupils are round equal and briskly reactive to light. CN III, IV, VI: extraocular movement are normal. No ptosis. CN V: Facial sensation is intact to pinprick in all 3 divisions bilaterally. Corneal responses are intact.  CN VII: Face is symmetric with normal eye closure and smile. CN VIII: Hearing is normal to rubbing fingers CN IX, X: Palate elevates symmetrically. Phonation is normal. CN XI: Head turning and shoulder shrug  are intact CN XII: Tongue is midline with normal movements and no atrophy.  MOTOR: Mild spastic left hemiparesis mild weakness of both proximal and distal left upper and lower extremity,  REFLEXES: Hyperreflexia on the left arm and leg   SENSORY: Intact to light touch, pinprick, position sense, and vibration sense are intact in fingers and toes.  COORDINATION: Rapid alternating movements and fine finger movements are intact. There is no dysmetria on finger-to-nose and heel-knee-shin.    GAIT/STANCE: Mild spastic left hemiparesis, dragging left leg   DIAGNOSTIC DATA (LABS, IMAGING, TESTING) - I reviewed patient records, labs, notes, testing and imaging myself where available.   ASSESSMENT AND PLAN  Elizah KAYNE YUHAS is a 54 y.o. male    Gait difficulty  Due to previous left C5-6 myelomalacia, spinal cord trauma  Continue moderate exercise  Return to clinic in one month  Marcial Pacas, M.D. Ph.D.  [facility] [address] [place], [place] [address] Ph: [phone] Fax: [phone]  [id number] Adrian Blackwater, MD

## 2015-06-07 NOTE — Telephone Encounter (Signed)
[name] saw neurologist today, wants [facility] to know that he was advised that he has to have physical therapy before neurologist can do anything with him; he says his neurologist says he needs "an MRI on his lower back", and that he has a "large brain (and) nerve damage on feet".  Mr. Mazmanian states that he will go to physical therapy on 06-09-15, but is unsure if Medicaid will pay. He wants to know if he needs a referral for the MRI. Mr. Clarkin is told that if any referrals are needed, he will receive a call from [facility] to inform him of the referral.

## 2015-06-09 ENCOUNTER — Encounter: Payer: Self-pay | Admitting: Physical Therapy

## 2015-06-09 ENCOUNTER — Ambulatory Visit: Payer: Medicaid Other | Attending: Family Medicine | Admitting: Physical Therapy

## 2015-06-09 ENCOUNTER — Telehealth: Payer: Self-pay | Admitting: Clinical

## 2015-06-09 DIAGNOSIS — R201 Hypoesthesia of skin: Secondary | ICD-10-CM

## 2015-06-09 DIAGNOSIS — R531 Weakness: Secondary | ICD-10-CM

## 2015-06-09 DIAGNOSIS — R269 Unspecified abnormalities of gait and mobility: Secondary | ICD-10-CM | POA: Insufficient documentation

## 2015-06-09 DIAGNOSIS — M6289 Other specified disorders of muscle: Secondary | ICD-10-CM | POA: Insufficient documentation

## 2015-06-09 DIAGNOSIS — R2681 Unsteadiness on feet: Secondary | ICD-10-CM | POA: Insufficient documentation

## 2015-06-09 NOTE — Telephone Encounter (Signed)
Return patient call, [name] wants Dr. Adrian Blackwater to know he was able to go to physical therapy today, he says thank you for referral. Medicaid will not pay for another physical therapy appointment until 2017. [name] states that he has no additional needs at this time.

## 2015-06-09 NOTE — Patient Instructions (Addendum)
HIP: Hamstrings - Short Sitting - LEFT   Rest LEFT leg on raised surface. Keep knee straight. Lift chest. Hold _30__ seconds. _4__ reps per set, __2 sets per day, _5__ days per week.  Deep Squat    Stand in front of a chair with your countertop in front of you (in case you lose your balance), but try not to hold on. Place feet shoulder width apart and squat as though you're about to sit in the chair behind you (but don't actually sit). Make sure your knees don't go over your toes.  Repeat _8_ times per set. Do __3__ sets per day. * As this gets easier, progress by 2-3 reps, as tolerated.     Bridging    Slowly raise buttocks from floor, keeping stomach tight. Repeat _12___ times per set. Do __3__ sets per day.   HIP: Flexion / KNEE: Extension, Straight Leg Raise - LEFT    While lying on your back, raise LEFT leg up, keeping knee straight. Perform slowly. Hold _2__ seconds then SLOWLY lower. * Make sure the back of your knee and the back of your heel touch the bed at the same time on the way down.   Perform 5 reps, 3 times per day. * Increase by 2 reps at a time as this gets easier.   Toe / Heel Raise     Stand with both hands on stable countertop. Gently rock back on heels and raise toes. Then rock forward on toes and raise heels. Repeat sequence _15___ times per session. Do 3 sessions per day.   Feet Apart (Compliant Surface) Varied Arm Positions - Eyes Open    Stand with back to corner with locked walker in front of you. With eyes open, standing on a pillow, feet shoulder width apart, and arms remaining by your side. Hold for 30 seconds, 4 times per day.

## 2015-06-09 NOTE — Therapy (Signed)
[place] [address] [place], [place], [address] Phone: [phone]   Fax:  [phone]  Physical Therapy Evaluation  Patient Details  Name: [name] MRN: [id number] Date of Birth: 1961/05/16 Referring Provider: Marcial Pacas, MD  Encounter Date: 06/09/2015      PT End of Session - 06/09/15 1952    Visit Number 1   Number of Visits 1   Authorization Type Medicaid   PT Start Time [id number]   PT Stop Time 1106   PT Time Calculation (min) 48 min   Activity Tolerance Patient tolerated treatment well   Behavior During Therapy [facility] for tasks assessed/performed      Past Medical History  Diagnosis Date  . Diabetes mellitus   . Hypertension   . 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 Cocaine abuse   . Chronic pain   . Alcohol abuse   . Tobacco abuse   . Gait instability   . Chronic arm pain   . Chronic leg pain   . Peripheral edema     chronic LUE    Past Surgical History  Procedure Laterality Date  . Left arm skin graft    . Left heart catheterization with coronary angiogram Bilateral 12/26/2013    Procedure: LEFT HEART CATHETERIZATION WITH CORONARY ANGIOGRAM;  Surgeon: Burnell Blanks, MD;  Location: [facility] CATH LAB;  Service: Cardiovascular;  Laterality: Bilateral;  . Cardiac catheterization  12/27/2013  . Cardiac catheterization  12/27/2013    Impression: 1. 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 Moderate disease in the small caliber intermediate branch, 3. Normal LV systolic function    There were no vitals filed for this visit.  Visit Diagnosis:  Left-sided weakness  Impaired sensation  Abnormality of gait  Unsteadiness      Subjective Assessment - 06/09/15 1027    Subjective "I fell and hurt my neck and pretty much my whole left side was weak. I went to physical therapy at [facility] but I still need it. It's not just the left side." Pt reports he "gets tired really easy  and I drag my left foot a lot because ain't no muscle in there."   Currently in Pain? Yes   Pain Score 8    Pain Location Generalized  "everywhere on the left side"   Pain Orientation Left   Pain Descriptors / Indicators Aching   Pain Type Chronic pain   Aggravating Factors  "I don't know."   Pain Relieving Factors "Not sure. I don't like to take pain pills ot anything."   Multiple Pain Sites No            [facility] PT Assessment - 06/09/15 0001    Assessment   Medical Diagnosis Left sided weakness   Referring Provider Marcial Pacas, MD   Onset Date/Surgical Date 12/26/13   Precautions   Precautions Fall   Restrictions   Weight Bearing Restrictions No   Balance Screen   Has the patient fallen in the past 6 months Yes   How many times? 4   Has the patient had a decrease in activity level because of a fear of falling?  No   Is the patient reluctant to leave their home because of a fear of falling?  No   Prior Function   Level of Independence Independent   Vocation On disability   Cognition   Overall Cognitive Status --   Sensation   Light Touch  Impaired by gross assessment  LLE   Proprioception Impaired by gross assessment  LLE   Coordination   Gross Motor Movements are Fluid and Coordinated No   Heel Shin Test Smoothness and excursion of movement limited on LLE as compared with RLE.   ROM / Strength   AROM / PROM / Strength Strength   Strength   Overall Strength Deficits   Overall Strength Comments RLE grossly WFL.   Strength Assessment Site Hip;Knee;Ankle   Right/Left Hip Left   Left Hip Flexion 3-/5   Left Hip Extension 3-/5   Right/Left Knee Left   Left Knee Extension 4-/5  within available ROM   Right/Left Ankle Left   Left Ankle Dorsiflexion 3+/5   Left Ankle Plantar Flexion 3-/5   Flexibility   Soft Tissue Assessment /Muscle Length yes   Hamstrings limited L hamstring extensibility   Transfers   Transfers Sit to Stand;Stand to Sit   Sit to Stand 6:  Modified independent (Device/Increase time)   Stand to Sit 6: Modified independent (Device/Increase time)   Comments Unable to perform sit > stand from standard chair without UE use.   Ambulation/Gait   Ambulation/Gait Yes   Ambulation/Gait Assistance 6: Modified independent (Device/Increase time)   Ambulation Distance (Feet) 300 Feet   Assistive device 4-wheeled walker   Gait Pattern Step-through pattern;Decreased stride length;Decreased dorsiflexion - left;Left foot flat   Ambulation Surface Level;Indoor   Balance   Balance Assessed Yes   Static Standing Balance   Static Standing - Balance Support No upper extremity supported   Static Standing - Level of Assistance 5: Stand by assistance   Static Standing - Comment/# of Minutes Static standing with EO on pillow x30 sec with (S), no LOB. Static standing with narrow BOS x30 sec with (S), no LOB                           PT Education - 06/09/15 1939    Education provided Yes   Education Details PT eval goals, findings. Discussed no insurance coverage for additional PT visits. Provided HEP to address impairments (see Pt Instructions); discussed safe/appropriate self-progression of HEP.    Person(s) Educated Patient   Methods Explanation;Demonstration;Tactile cues;Verbal cues;Handout   Comprehension Verbalized understanding;Returned demonstration                    Plan - 06/09/15 1953    Clinical Impression Statement Pt is a 54 y/o M referred to outpatient neuro PT to address functional impairments associated with L-sided weakness. PMH significant for spinal cord contusion 12/26/13. PT evaluation reveals the following: abnormal gait, LLE weakness, impaired sensation/proprioception in LLE, impaired balance. Based on diagnosis, pt does not qualify for insurance coverage. Educated pt on PT eval findings, insurance limitations, and options for treatment (including self-pay). Due to financial/insurance limitations,  pt elected PT evaluation only today. Provided verbal explanation, demonstration, and paper handout of HEP to address pt-specific impairments noted on evaluation. Discussed safe/appropriate progression of all exercises.    Pt will benefit from skilled therapeutic intervention in order to improve on the following deficits Abnormal gait;Decreased balance;Decreased coordination;Decreased endurance;Decreased strength;Decreased range of motion;Increased edema;Impaired flexibility;Postural dysfunction;Impaired sensation;Pain   Rehab Potential Fair   Clinical Impairments Affecting Rehab Potential Financial limitations (insurance will cover evaluation only)   PT Frequency One time visit   Consulted and Agree with Plan of Care Patient         Problem List Patient Active Problem  List   Diagnosis Date Noted  . Abnormality of gait 06/07/2015  . Hydrocephalus 06/07/2015  . Peripheral neuropathy ([place]) 04/26/2015  . Constipation 04/26/2015  . Type 2 diabetes mellitus with proteinuria or albuminuria 03/16/2015  . Decreased hearing of both ears 03/15/2015  . Skin pustule 03/15/2015  . Tinea pedis 03/15/2015  . Callus of foot 05/22/2014  . 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 Coronary vasospasm, cocaine related 12/29/2013  . Cocaine abuse 12/28/2013  . Alcoholism /alcohol abuse ([place]) 12/28/2013  . Spinal cord contusion 12/27/2013  . Left-sided weakness 12/26/2013  . Hyperlipidemia associated with type 2 diabetes mellitus ([place]) 04/01/2007  . Diabetes mellitus, type 2 ([place]) 03/28/2007  . OBESITY NOS 03/28/2007  . Essential hypertension 03/28/2007    Billie Ruddy, PT, DPT [facility] [address] [place], [place], [address] Phone: [phone]   Fax:  [phone] 06/09/2015, 7:59 PM  Name: [name] MRN: [id number] Date of Birth: 1961/01/07

## 2015-06-15 ENCOUNTER — Telehealth: Payer: Self-pay | Admitting: Clinical

## 2015-06-15 NOTE — Addendum Note (Signed)
Addended by: Boykin Nearing on: 06/15/2015 05:30 PM   Modules accepted: Orders

## 2015-06-15 NOTE — Telephone Encounter (Signed)
MRI ordered needs to be scheduled  Also personal care  Services form completed  and placed in to be faxed.

## 2015-06-15 NOTE — Telephone Encounter (Signed)
Attempt to return pt call, left HIPPA-compliant message to return call to [facility] from [facility] at [phone].

## 2015-06-18 ENCOUNTER — Other Ambulatory Visit: Payer: Self-pay | Admitting: Family Medicine

## 2015-06-18 DIAGNOSIS — I10 Essential (primary) hypertension: Secondary | ICD-10-CM

## 2015-06-21 NOTE — Telephone Encounter (Signed)
Pt notified MRI appointment on 07/01/2015 at 12:00 arriving 15 min early Lab appointment schedule for BMP

## 2015-06-23 ENCOUNTER — Telehealth: Payer: Self-pay | Admitting: Clinical

## 2015-06-23 NOTE — Telephone Encounter (Signed)
[name] wants to know the time of his MRI appointment at [facility], and how long will he have to wait for his lab appointment at [facility] to be finished, as his ride is picking him up at 9:30am. [name] is informed that his upcoming MRI appointment is at noon, but that it is always a good idea to arrive early for any necessary paperwork and insurance processing; also, lab appointments generally do not last long, but that it is advised that he arrive early, as there is no guarantee that he will be seen at exactly 9:15, nor that he will be complete by exactly 9:30am, and he states that he understands.

## 2015-06-24 ENCOUNTER — Ambulatory Visit: Payer: Medicaid Other | Attending: Family Medicine

## 2015-06-24 DIAGNOSIS — I10 Essential (primary) hypertension: Secondary | ICD-10-CM

## 2015-06-24 LAB — BASIC METABOLIC PANEL
BUN: 16 mg/dL (ref 7–25)
CALCIUM: 9.5 mg/dL (ref 8.6–10.3)
CO2: 25 mmol/L (ref 20–31)
CREATININE: 0.89 mg/dL (ref 0.70–1.33)
Chloride: 105 mmol/L (ref 98–110)
Glucose, Bld: 88 mg/dL (ref 65–99)
Potassium: 5.2 mmol/L (ref 3.5–5.3)
SODIUM: 139 mmol/L (ref 135–146)

## 2015-06-26 ENCOUNTER — Encounter (HOSPITAL_COMMUNITY): Payer: Self-pay | Admitting: Emergency Medicine

## 2015-06-26 ENCOUNTER — Emergency Department (HOSPITAL_COMMUNITY)
Admission: EM | Admit: 2015-06-26 | Discharge: 2015-06-26 | Disposition: A | Discharge status: Home / Self Care | Payer: Medicaid Other | Attending: Emergency Medicine | Admitting: Emergency Medicine

## 2015-06-26 DIAGNOSIS — K5900 Constipation, unspecified: Secondary | ICD-10-CM | POA: Insufficient documentation

## 2015-06-26 DIAGNOSIS — Z9889 Other specified postprocedural states: Secondary | ICD-10-CM | POA: Diagnosis not present

## 2015-06-26 DIAGNOSIS — E119 Type 2 diabetes mellitus without complications: Secondary | ICD-10-CM | POA: Insufficient documentation

## 2015-06-26 DIAGNOSIS — F17210 Nicotine dependence, cigarettes, uncomplicated: Secondary | ICD-10-CM | POA: Diagnosis not present

## 2015-06-26 DIAGNOSIS — Z7984 Long term (current) use of oral hypoglycemic drugs: Secondary | ICD-10-CM | POA: Insufficient documentation

## 2015-06-26 DIAGNOSIS — R531 Weakness: Secondary | ICD-10-CM

## 2015-06-26 DIAGNOSIS — R3911 Hesitancy of micturition: Secondary | ICD-10-CM | POA: Diagnosis not present

## 2015-06-26 DIAGNOSIS — I10 Essential (primary) hypertension: Secondary | ICD-10-CM | POA: Insufficient documentation

## 2015-06-26 DIAGNOSIS — R05 Cough: Secondary | ICD-10-CM | POA: Diagnosis not present

## 2015-06-26 DIAGNOSIS — R109 Unspecified abdominal pain: Secondary | ICD-10-CM | POA: Diagnosis not present

## 2015-06-26 DIAGNOSIS — Z79899 Other long term (current) drug therapy: Secondary | ICD-10-CM | POA: Diagnosis not present

## 2015-06-26 DIAGNOSIS — R350 Frequency of micturition: Secondary | ICD-10-CM | POA: Insufficient documentation

## 2015-06-26 DIAGNOSIS — R200 Anesthesia of skin: Secondary | ICD-10-CM | POA: Diagnosis not present

## 2015-06-26 DIAGNOSIS — M542 Cervicalgia: Secondary | ICD-10-CM | POA: Diagnosis not present

## 2015-06-26 DIAGNOSIS — F1410 Cocaine abuse, uncomplicated: Secondary | ICD-10-CM | POA: Diagnosis not present

## 2015-06-26 DIAGNOSIS — G8929 Other chronic pain: Secondary | ICD-10-CM | POA: Insufficient documentation

## 2015-06-26 DIAGNOSIS — R42 Dizziness and giddiness: Secondary | ICD-10-CM | POA: Insufficient documentation

## 2015-06-26 LAB — RAPID URINE DRUG SCREEN, HOSP PERFORMED
Amphetamines: NOT DETECTED
BENZODIAZEPINES: NOT DETECTED
Barbiturates: NOT DETECTED
Cocaine: POSITIVE — AB
Opiates: NOT DETECTED
Tetrahydrocannabinol: NOT DETECTED

## 2015-06-26 LAB — URINALYSIS, ROUTINE W REFLEX MICROSCOPIC
Bilirubin Urine: NEGATIVE
Glucose, UA: NEGATIVE mg/dL
Ketones, ur: NEGATIVE mg/dL
LEUKOCYTES UA: NEGATIVE
NITRITE: NEGATIVE
SPECIFIC GRAVITY, URINE: 1.017 (ref 1.005–1.030)
pH: 5.5 (ref 5.0–8.0)

## 2015-06-26 LAB — URINE MICROSCOPIC-ADD ON: BACTERIA UA: NONE SEEN

## 2015-06-26 NOTE — ED Provider Notes (Signed)
CSN: [id number]     Arrival date & time 06/26/15  1128 History  By signing my name below, I, Starleen Arms, attest that this documentation has been prepared under the direction and in the presence of Bernerd Limbo, [place]. Electronically Signed: Starleen Arms ED Scribe. 06/26/2015. 12:17 PM.    Chief Complaint  Patient presents with  . Gait Problem    The history is provided by the patient. No language interpreter was used.    HPI Comments: [name] is a 54 y.o. male brought in by EMS with hx of chronic left leg/arm pain and left-sided weakness who presents to the Emergency Department complaining of normal to baseline, unchanged, bilateral left > right leg weakness and bilateral moderate foot dragging onset 1 year ago. The patient also notes some normal to baseline, unchanged left arm weakness. He is able to ambulate normal to baseline with a cane and the only new feature of the patient's complaint is that it takes him slightly longer to get out of his chair. He reports the primary reason he came to the ED today is that he is afraid of falling and "wants some answers" regarding his chronic weakness. The patient has been evaluated by a neurologist for same complaint and his weakness was attributed to a distant injury (next appointment: 12/20). He also notes urinary hesitancy, unchanged left-sided neck pain (onset 1 year ago), lightheadedness, dizziness, abdominal pain, dry cough, and constipation (last bowel movement: 2 days ago; currently using stool softener). He denies headache, nausea, vomiting, diarrhea, dysuria. Patient does not use illicit drugs but consumes a moderate amount of alcohol ("4 beers yesterday"). The patient lives at home but has a caretaker. Per nursing note, the patient stated to EMS that he may have called 911 out of loneliness.    Past Medical History  Diagnosis Date  . Diabetes mellitus   . Hypertension   . Left-sided weakness 12/2013    chronic due to spinal cord  contusion  . Cocaine abuse   . Chronic pain   . Alcohol abuse   . Tobacco abuse   . Gait instability   . Chronic arm pain   . Chronic leg pain   . Peripheral edema     chronic LUE   Past Surgical History  Procedure Laterality Date  . Left arm skin graft    . Left heart catheterization with coronary angiogram Bilateral 12/26/2013    Procedure: LEFT HEART CATHETERIZATION WITH CORONARY ANGIOGRAM;  Surgeon: Burnell Blanks, MD;  Location: [facility] CATH LAB;  Service: Cardiovascular;  Laterality: Bilateral;  . Cardiac catheterization  12/27/2013  . Cardiac catheterization  12/27/2013    Impression: 1. 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 Moderate disease in the small caliber intermediate branch, 3. Normal LV systolic function   Family History  Problem Relation Age of Onset  . Hypertension Mother   . Diabetes Mother   . Cancer Mother     breast cancer   . Hypertension Father   . Heart disease Father   . Stroke Father   . Hypertension Sister    Social History  Substance Use Topics  . Smoking status: Current Every Day Smoker -- 0.25 packs/day    Types: Cigarettes  . Smokeless tobacco: Never Used  . Alcohol Use: 0.0 oz/week    0 Standard drinks or equivalent per week     Comment: occassional     Review of Systems  Constitutional: Negative for fever.  Respiratory: Positive for cough.   Gastrointestinal:  Positive for constipation. Negative for nausea, vomiting and diarrhea.  Genitourinary: Negative for dysuria.  Musculoskeletal: Positive for neck pain.  Neurological: Positive for dizziness, weakness, light-headedness and numbness. Negative for headaches.      Allergies  Review of patient's allergies indicates no known allergies.  Home Medications   Prior to Admission medications   Medication Sig Start Date End Date Taking? Authorizing Provider  atorvastatin (LIPITOR) 40 MG tablet Take 1 tablet (40 mg total) by mouth daily. 03/16/15  Yes Josalyn Funches, MD  gabapentin (NEURONTIN)  300 MG capsule Take 1 capsule (300 mg total) by mouth 3 (three) times daily. 11/23/14  Yes Deepak Advani, MD  glipiZIDE (GLUCOTROL) 10 MG tablet TAKE 1 TABLET (10 MG TOTAL) BY MOUTH TWO   (TWO) TIMES DAILY BEFORE A MEAL. 06/21/15  Yes Josalyn Funches, MD  lisinopril (PRINIVIL,ZESTRIL) 20 MG tablet Take 1 tablet (20 mg total) by mouth daily. 05/05/15  Yes Boykin Nearing, MD  metFORMIN (GLUCOPHAGE) 1000 MG tablet Take 1 tablet (1,000 mg total) by mouth 2 (two) times daily with a meal. Resume on 04/29/15. 04/29/15  Yes Shanker Kristeen Mans, MD  simvastatin (ZOCOR) 20 MG tablet TAKE 1 TABLET (20 MG TOTAL) BY MOUTH DAILY AT 6 PM. 06/21/15  Yes Josalyn Funches, MD  glucose monitoring kit (FREESTYLE) monitoring kit 1 each by Does not apply route as needed for other. Dispense any model that is covered- dispense testing supplies for Q AC/ HS accuchecks- 1 month supply with one refil. 05/22/14   Deepak Advani, MD    BP 165/71 mmHg  Pulse 93  Temp(Src) 98 F (36.7 C) (Oral)  Resp 17  SpO2 98% Physical Exam  Constitutional: He is oriented to person, place, and time. He appears well-developed and well-nourished. No distress.  HENT:  Head: Normocephalic and atraumatic.  Right Ear: External ear normal.  Left Ear: External ear normal.  Nose: Nose normal.  Mouth/Throat: Oropharynx is clear and moist. No oropharyngeal exudate.  Eyes: Conjunctivae and EOM are normal. Pupils are equal, round, and reactive to light. Right eye exhibits no discharge. Left eye exhibits no discharge. No scleral icterus.  Neck: Normal range of motion. Neck supple. Muscular tenderness present. No spinous process tenderness present.  Mild TTP of left cervical paraspinal muscles. No midline tenderness, step-off, or deformity.  Cardiovascular: Normal rate, regular rhythm and intact distal pulses.   Pulmonary/Chest: Effort normal and breath sounds normal. No respiratory distress. He has no wheezes. He has no rales.  Abdominal: Soft. Bowel  sounds are normal. He exhibits no distension and no mass. There is no tenderness. There is no rebound and no guarding.  Musculoskeletal: Normal range of motion. He exhibits no edema or tenderness.  Neurological: He is alert and oriented to person, place, and time. No sensory deficit.  Reflex Scores:      Patellar reflexes are 2+ on the right side and 3+ on the left side. Right upper extremity strength 5/5. Left upper extremity strength 4/5. Right lower extremity strength 5/5. Left lower extremity strength 4/5. Patient able to ambulate with his walker.  Skin: Skin is warm and dry. He is not diaphoretic.  Psychiatric: He has a normal mood and affect. His behavior is normal.  Nursing note and vitals reviewed.   ED Course  Procedures (including critical care time)  DIAGNOSTIC STUDIES: Oxygen Saturation is 98% on RA, normal by my interpretation.    COORDINATION OF CARE: 12:18 PM Will order labs to r/o UTI.  Patient acknowledges and agrees with plan.  Labs Review Labs Reviewed  URINE RAPID DRUG SCREEN, HOSP PERFORMED - Abnormal; Notable for the following:    Cocaine POSITIVE (*)    All other components within normal limits  URINALYSIS, ROUTINE W REFLEX MICROSCOPIC (NOT AT [facility]) - Abnormal; Notable for the following:    Hgb urine dipstick TRACE (*)    Protein, ur >300 (*)    All other components within normal limits  URINE MICROSCOPIC-ADD ON - Abnormal; Notable for the following:    Squamous Epithelial / LPF 0-5 (*)    Casts HYALINE CASTS (*)    All other components within normal limits    Imaging Review No results found.   I have personally reviewed and evaluated these lab results as part of my medical decision-making.   EKG Interpretation None      MDM   Final diagnoses:  Weakness of left side of body    54 year old male presents with left sided weakness, which he reports is chronic and unchanged, and states he is afraid of falling. He also reports dizziness and  lightheadedness (however is unable to further characterize these symptoms) as well as dry cough, abdominal pain, and constipation. In addition, he complains of urinary hesitancy and frequency, which he states are new symptoms.   Patient is afebrile. Vital signs stable. Head normocephalic and atraumatic. Mild TTP of left cervical paraspinal muscles. No midline tenderness, step-off, or deformity. Heart RRR. Lungs clear to auscultation bilaterally. Abdomen soft, non-tender, non-distended. No rebound, guarding, or masses. Neurologic exam consistent with baseline (per review of neurology notes). Patient able to ambulate with his walker.   Will obtain UA and UDS. UA negative for infection. UDS positive for cocaine. Discussed results with patient, who admits to using cocaine yesterday.   Do not feel further evaluation is indicated at this time, as patient's weakness is chronic and he denies new symptoms. Patient stable for discharge at this time. Patient to follow-up with neurology as scheduled. Counseled on drug and alcohol cessation and fall prevention measures. Patient has been evaluated by PT, who gave him exercises to do daily, which he states he has not been doing. Encouraged to do exercises as instructed. Return precautions discussed. Patient verbalizes his understanding and is in agreement with plan.  BP 165/71 mmHg  Pulse 93  Temp(Src) 98 F (36.7 C) (Oral)  Resp 17  SpO2 98%  I personally performed the services described in this documentation, which was scribed in my presence. The recorded information has been reviewed and is accurate.    Marella Chimes, PA-C 06/26/15 1417  Wandra Arthurs, MD 06/26/15 [phone]

## 2015-06-26 NOTE — Discharge Instructions (Signed)
1. Medications: usual home medications 2. Treatment: rest, drink plenty of fluids, stop using cocaine and alcohol 3. Follow Up: please followup with your primary doctor and with neurology as scheduled for discussion of your diagnoses and further evaluation after today's visit; if you do not have a primary care doctor use the resource guide provided to find one; please return to the ER for new or worsening symptoms   Emergency Department Resource Guide 1) Find a Doctor and Pay Out of Pocket Although you won't have to find out who is covered by your insurance plan, it is a good idea to ask around and get recommendations. You will then need to call the office and see if the doctor you have chosen will accept you as a new patient and what types of options they offer for patients who are self-pay. Some doctors offer discounts or will set up payment plans for their patients who do not have insurance, but you will need to ask so you aren't surprised when you get to your appointment.  2) Contact Your Local Health Department Not all health departments have doctors that can see patients for sick visits, but many do, so it is worth a call to see if yours does. If you don't know where your local health department is, you can check in your phone book. The CDC also has a tool to help you locate your state's health department, and many state websites also have listings of all of their local health departments.  3) Find a [facility] If your illness is not likely to be very severe or complicated, you may want to try a walk in clinic. These are popping up all over the country in pharmacies, drugstores, and shopping centers. They're usually staffed by nurse practitioners or physician assistants that have been trained to treat common illnesses and complaints. They're usually fairly quick and inexpensive. However, if you have serious medical issues or chronic medical problems, these are probably not your best  option.  No Primary Care Doctor: - Call Health Connect at  [phone] - they can help you locate a primary care doctor that  accepts your insurance, provides certain services, etc. - Physician Referral Service- [phone]  Chronic Pain Problems: Organization         Address  Phone   Notes  [facility]  [phone] Patients need to be referred by their primary care doctor.   Medication Assistance: Organization         Address  Phone   Notes  [facility] Medication [facility] [address] [place], [place] [address] [phone] --Must be a resident of [facility] -- Must have NO insurance coverage whatsoever (no Medicaid/ Medicare, etc.) -- The pt. MUST have a primary care doctor that directs their care regularly and follows them in the community   MedAssist  [phone]   [employer]  [phone]    Agencies that provide inexpensive medical care: Organization         Address  Phone   Notes  [place]  [phone]   [facility]    [phone]   [facility] [place], [place] [address] [phone]   [place] [phone]   Planned Parenthood    [phone]   [facility]    [phone]   Community Health and [facility]  [address], [place] Phone:  [phone], Fax:  [phone] Hours of Operation:  9 am - 6 pm, M-F.  Also accepts Medicaid/Medicare and self-pay.  [facility] [place], [address], [place] Phone: [phone], Fax: [phone]. Hours of Operation:  8:30 am - 5:30 pm, M-F.  Also accepts Medicaid and self-pay.  [facility] [place] [address], [place] Phone: [phone]   [place], [place], [place] [phone], Ext. [phone] Mondays & Thursdays: 7-9 AM.  First 15  patients are seen on a first come, first serve basis.    [place] Providers:  Organization         Address  Phone   Notes  [facility] [address], [place] [phone] Also accepts self-pay patients.  [facility] [address], [place]  [phone]   [place], [address], [place] [phone]   [facility] [address] [place] [phone]   Lucianne Lei [address], [place]   [phone] Only accepts [place] Access [place] patients after they have their name applied to their card.   Self-Pay (no insurance) in [facility]:  Organization         Address  Phone   Notes  Sickle Cell Patients, [facility] [place] [phone]   [facility] [place] [phone]   [facility] [place]  [place], [place], [place] [phone]   [facility]/Dr. Osei-Bonsu  [address], [place] [address], [place] [phone] Phone number for both [place] and [place] locations is the same.  Urgent Medical and [facility] [address] [place] [phone]   [facility] [address], [place] or [address] [phone]   [facility] [address] [place] [phone], phone; [phone], fax Sees patients 1st and 3rd Saturday of every month.  Must not qualify for public or private insurance (i.e. Medicaid, Medicare, [place] Health Choice, Veterans' Benefits)  Household income should be no more than 200% of the poverty level The clinic cannot treat you if you are pregnant or think you are pregnant  Sexually transmitted diseases are not treated at the clinic.    Dental  Care: Organization         Address  Phone  Notes  [facility] Department of [facility] [place] [phone] Accepts children up to age 61 who are enrolled in [place] or [employer]; pregnant women with a Medicaid card; and children who have applied for Medicaid or [place] Health Choice, but were declined, whose parents can pay a reduced fee at time of service.  [facility] Department of [facility]  [address], [place] [phone] Accepts children up to age 47 who are enrolled in [place] or [place]; pregnant women with a Medicaid card; and children who have applied for Medicaid or [place] Health Choice, but were declined, whose parents can pay a reduced fee at time of service.  [facility] Adult Dental Access PROGRAM  [place], [place] [phone]  Patients are seen by appointment only. Walk-ins are not accepted. [place] will see patients 1 years of age and older. Monday - Tuesday (8am-5pm) Most Wednesdays (8:30-5pm) $30 per visit, cash only  [facility] Adult Dental Access PROGRAM  [address], [facility] [phone] Patients are seen by appointment only. Walk-ins are not accepted. [place] will see patients 28 years of age and older. One Wednesday Evening (Monthly: Volunteer Based).  $30 per visit, cash only  [place]  [phone] for adults; Children under age 81, call Graduate Pediatric Dentistry at [phone]. Children aged 82-14, please call [phone] to request a pediatric application.  Dental services are provided in all areas of dental care including fillings, crowns and bridges, complete and partial dentures, implants, gum treatment, root canals, and extractions. Preventive care is also provided. Treatment is provided to both adults and children. Patients are selected via a lottery and there is often a waiting list.   [facility] [address] [place]  [phone] [email or web address]   [facility] [address] [place], [place] [phone], Ext. [phone] Second and Fourth Thursday of each month, opens at 6:30 AM; Clinic ends at 9 AM.  Patients are seen on a first-come first-served basis, and a limited number are seen during each clinic.   [facility]  [address] Hillard Danker [place], [place] [phone]   Eligibility Requirements You must have lived in [place], [place], or [place] counties for at least the last three months.   You cannot be eligible for state or federal sponsored [employer], including [employer], [place], or [employer].   You generally cannot be eligible for healthcare insurance through your employer.    How to apply: Eligibility screenings are held every Tuesday and Wednesday afternoon from 1:00 pm until 4:00 pm. You do not need an appointment for the interview!  [facility] [address] [place], [place]   [place]  [place] Department  [place]  [phone]    Behavioral Health Resources in the Community: Intensive Outpatient Programs Organization         Address  Phone  Notes  [place]. [address], [place], [place] [phone]   [facility] Outpatient [address] [place], [place]   ADS: Alcohol & Drug Svcs [address], [place], [place]   [place] [address],  [place], [place] or [phone]   Substance Abuse Resources Organization         Address  Phone  Notes  Alcohol and Drug Services  [phone]   [place]  [phone]   The [place]   Chinita Pester  [phone]   Residential & Outpatient Substance Abuse Program  [phone]    Psychological Services Organization         Address  Phone  Notes  [facility] [place]  [place]  [phone]   [place] [address], [place] or [phone]    Mobile Crisis Teams Organization         Address  Phone  Notes  [facility]rapeutic Alternatives, Mobile Crisis Care Unit  [phone]   [facility]ive Psychotherapeutic Services  [address] [employer], [place]   [facility] [place] [address] [place]  [place] [phone]    Self-Help/Support Groups Organization         Address  Phone             Notes  Mental Health Assoc. of [place] - variety of support groups  [place] Call for more information  Narcotics Anonymous (NA), Caring Services [address], [employer] [place]  2 meetings at this location   Special educational needs teacher         Address  Phone  Notes  ASAP Residential Treatment [place],    [place]  [phone]   [facility]  [address] [place] [phone], [place], [place]   [place], [place] [phone] Admissions: 8am-3pm M-F  Incentives Substance [place] [address],    [place], [place] [phone]   [facility] [address] [place], [place], [place]   [facility] [address]  [place], [place]   [facility] Programs - Intensive Outpatient [address], [place], [place]   [facility] [place].,  [place], [place] [phone] or [phone]   [facility] Services (RTS) [address], [place], [place] Accepts Medicaid  [facility] [place] [address]  [place] [phone] Substance Abuse/Addiction Treatment   [facility] Organization         Address  Phone  Notes  [facility]  [phone]   Domenic Schwab, PhD [address] Arlis [place], [place]   [phone] or [phone]   [place], [place] [phone]   [facility] [address], [place], [place] [phone] Insurance/Medicaid/sponsorship through [facility] and Families [address] [place]                                    [place], [place] [phone] [place] [address], [place] [phone]    Dr. Adele Schilder  [phone]   [facility] Dept. 1) [address],  2) [place] 3)  [place] [address], [place] [phone]  [phone]   [place] [phone] or [phone] (After Hours)

## 2015-06-26 NOTE — ED Notes (Signed)
Per EMS-states he call 911 for unsteady gait-has a history of the same-states he might have called out of loneliness-states asks to have stretches brought to him because he didn't want to walk-no complaints of pain

## 2015-06-29 ENCOUNTER — Telehealth: Payer: Self-pay | Admitting: Clinical

## 2015-06-29 NOTE — Telephone Encounter (Signed)
Returned pt call, [name] is unsure where he needs to go for MRI appointment on Thursday, 07-01-15. It is recommended that he [facility] to [facility], first floor, radiology department, prior to 12:00pm appointment, and he agrees.

## 2015-06-30 ENCOUNTER — Telehealth: Payer: Self-pay | Admitting: Clinical

## 2015-06-30 NOTE — Telephone Encounter (Signed)
[name] wants [facility] to know that his MRI has been rescheduled for 07-14-15 at 4:00pm.

## 2015-07-01 ENCOUNTER — Ambulatory Visit (HOSPITAL_COMMUNITY): Admission: RE | Admit: 2015-07-01 | Payer: MEDICAID | Source: Ambulatory Visit

## 2015-07-02 ENCOUNTER — Telehealth: Payer: Self-pay | Admitting: *Deleted

## 2015-07-02 NOTE — Telephone Encounter (Signed)
-----   Message from Boykin Nearing, MD sent at 06/25/2015  9:08 AM EST ----- Bmp normal

## 2015-07-02 NOTE — Telephone Encounter (Signed)
Date of birth verified by pt Normal lab results given  Pt verbalized understanding  

## 2015-07-07 ENCOUNTER — Encounter: Payer: Self-pay | Admitting: Neurology

## 2015-07-07 ENCOUNTER — Ambulatory Visit (INDEPENDENT_AMBULATORY_CARE_PROVIDER_SITE_OTHER): Payer: Medicaid Other | Admitting: Neurology

## 2015-07-07 VITALS — BP 119/67 | HR 71 | Ht 75.0 in | Wt 275.0 lb

## 2015-07-07 DIAGNOSIS — M6289 Other specified disorders of muscle: Secondary | ICD-10-CM | POA: Diagnosis not present

## 2015-07-07 DIAGNOSIS — R269 Unspecified abnormalities of gait and mobility: Secondary | ICD-10-CM

## 2015-07-07 DIAGNOSIS — G609 Hereditary and idiopathic neuropathy, unspecified: Secondary | ICD-10-CM

## 2015-07-07 DIAGNOSIS — G919 Hydrocephalus, unspecified: Secondary | ICD-10-CM

## 2015-07-07 DIAGNOSIS — R531 Weakness: Secondary | ICD-10-CM

## 2015-07-07 NOTE — Progress Notes (Signed)
Chief Complaint  Patient presents with  . Abnormal Gait    Reports his left-sided weakness is worse.  He is having more gait difficulty.  He is using a cane to assist with ambulation.      PATIENT: [name] DOB: [birth year]  Chief Complaint  Patient presents with  . Abnormal Gait    Reports his left-sided weakness is worse.  He is having more gait difficulty.  He is using a cane to assist with ambulation.     HISTORICAL  [name] is a 54 year old right-handed male, seen in refer by his primary care physician Dr. Boykin Nearing in June 07 2015 for evaluation of left-sided weakness.  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  Review of laboratory, UDS was positive for cocaine,  Echocardiogram in 2015 showed ejection fraction 55-60%, mildly low B12 277, normal TSH, BMP, A1c was 8.0.   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  His gait difficulty likely multifactorial, including congenital hydrocephalus, slow progression, left posterior C5-6 spinal cord trauma, diabetic peripheral neuropathy  REVIEW OF SYSTEMS: Full 14 system review of systems performed and notable only for as above  ALLERGIES: No Known Allergies  HOME MEDICATIONS: Current Outpatient Prescriptions  Medication Sig Dispense Refill  . atorvastatin (LIPITOR) 40 MG tablet Take 1 tablet (40 mg total) by mouth daily. 30 tablet 5  . gabapentin (NEURONTIN) 300 MG capsule Take 1 capsule (300 mg total) by mouth 3 (three) times daily. 90 capsule 3  . glipiZIDE (GLUCOTROL) 10 MG tablet TAKE 1 TABLET (10 MG TOTAL) BY MOUTH TWO   (TWO) TIMES DAILY BEFORE A MEAL. 60 tablet 3  . glucose monitoring kit (FREESTYLE) monitoring kit 1 each by Does not apply route as needed for other. Dispense any model that is covered- dispense testing supplies for Q AC/ HS accuchecks- 1 month supply with one refil. 1 each 1  . lisinopril (PRINIVIL,ZESTRIL) 20 MG tablet Take 1 tablet (20 mg total) by mouth daily. 30 tablet 5  . metFORMIN (GLUCOPHAGE) 1000 MG tablet Take 1 tablet (1,000 mg total) by mouth 2 (two) times daily with a meal. Resume on 04/29/15.    . simvastatin (ZOCOR) 20 MG tablet TAKE 1 TABLET (20 MG TOTAL) BY MOUTH DAILY AT 6 PM. 30 tablet 3   No  current facility-administered medications for this visit.    PAST MEDICAL HISTORY: Past Medical History  Diagnosis Date  . Diabetes mellitus   . Hypertension   . 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 Cocaine abuse   . Chronic pain   . Alcohol abuse   . Tobacco abuse   . Gait instability   . Chronic arm pain   . Chronic leg pain   . Peripheral edema     chronic  LUE    PAST SURGICAL HISTORY: Past Surgical History  Procedure Laterality Date  . Left arm skin graft    . Left heart catheterization with coronary angiogram Bilateral 12/26/2013    Procedure: LEFT HEART CATHETERIZATION WITH CORONARY ANGIOGRAM;  Surgeon: Burnell Blanks, MD;  Location[facility] LAB;  Service: Cardiovascular;  Laterality: Bilateral;  . Cardiac catheterization  12/27/2013  . Cardiac catheterization  12/27/2013    Impression: 1. 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 Moderate disease in the small caliber intermediate branch, 3. Normal LV systolic function    FAMILY HISTORY: Family History  Problem Relation Age of Onset  . Hypertension Mother   . Diabetes Mother   . Cancer Mother     breast cancer   . Hypertension Father   . Heart disease Father   . Stroke Father   . Hypertension Sister     SOCIAL HISTORY:  Social History   Social History  . Marital Status: Divorced    Spouse Name: N/A  . Number of Children: 0  . Years of Education: 10   Occupational History  . Unemployed    Social History Main Topics  . Smoking status: Current Every Day Smoker -- 0.25 packs/day    Types: Cigarettes  . Smokeless tobacco: Never Used  . Alcohol Use: 0.0 oz/week    0 Standard drinks or equivalent per week     Comment: occassional  . Drug Use: No     Comment: last use June 2015  . Sexual Activity: Not on file   Other Topics Concern  . Not on file   Social History Narrative   Lives at home alone.   Right-handed.   Occasional use.     PHYSICAL EXAM   Filed Vitals:   07/07/15 0756  BP: 119/67  Pulse: 71  Height: _0  (1.905 m)  Weight: 275 lb (124.739 kg)    Not recorded      Body mass index is 34.37 kg/(m^2).  PHYSICAL EXAMNIATION:  Gen: NAD, conversant, well nourised, obese, well groomed                     Cardiovascular: Regular rate rhythm, no peripheral edema, warm, nontender. Eyes: Conjunctivae clear without exudates or hemorrhage Neck: Supple, no  carotid bruise. Pulmonary: Clear to auscultation bilaterally   NEUROLOGICAL EXAM:  MENTAL STATUS: Speech:    Speech is normal; fluent and spontaneous with normal comprehension.  Cognition:     Orientation to time, place and person     Normal recent and remote memory     Normal Attention span and concentration     Normal Language, naming, repeating,spontaneous speech     Fund of knowledge   CRANIAL NERVES: CN II: Visual fields are full to confrontation. Fundoscopic exam is normal with sharp discs and no vascular changes. Pupils are round equal and briskly reactive to light. CN III, IV, VI: extraocular movement are normal. No ptosis. CN V: Facial sensation is  intact to pinprick in all 3 divisions bilaterally. Corneal responses are intact.  CN VII: Face is symmetric with normal eye closure and smile. CN VIII: Hearing is normal to rubbing fingers CN IX, X: Palate elevates symmetrically. Phonation is normal. CN XI: Head turning and shoulder shrug are intact CN XII: Tongue is midline with normal movements and no atrophy.  MOTOR: Mild spastic left hemiparesis mild weakness of both proximal and distal left upper and lower extremity, left hand posturing, left finger staying extension, mild cupping,   REFLEXES: Hyperreflexia on the left arm and leg   SENSORY: Length dependent decreased light touch, pinprick, vibratory sensations.   COORDINATION: Rapid alternating movements and fine finger movements are intact. There is no dysmetria on finger-to-nose and heel-knee-shin.    GAIT/STANCE:  stiff, cautious wide-based unsteady gait, mild  spastic left hemiparesis, dragging left leg   DIAGNOSTIC DATA (LABS, IMAGING, TESTING) - I reviewed patient records, labs, notes, testing and imaging myself where available.   ASSESSMENT AND PLAN  [name] is a 54 y.o. male   Hydrocephalus   he had evidence of macrocephaly, hydrocephalus was present even before 2005, slow progressing over the  years,  narrow aqueduct   Gait difficulty   Multifactorial, likely his progress slow worsening congenital hydrocephalus, peripheral neuropathy, left C5-6 cord lesion  Peripheral neuropathy  EMG nerve conduction study    Marcial Pacas, M.D. Ph.D.  [facility] [address] [place],  [address] Ph: [phone] Fax: [phone]  RV:[id number] Adrian Blackwater, MD

## 2015-07-14 ENCOUNTER — Telehealth: Payer: Self-pay | Admitting: Clinical

## 2015-07-14 ENCOUNTER — Encounter: Payer: Self-pay | Admitting: Podiatry

## 2015-07-14 ENCOUNTER — Ambulatory Visit (INDEPENDENT_AMBULATORY_CARE_PROVIDER_SITE_OTHER): Payer: Medicaid Other | Admitting: Podiatry

## 2015-07-14 ENCOUNTER — Ambulatory Visit (HOSPITAL_COMMUNITY)
Admission: RE | Admit: 2015-07-14 | Discharge: 2015-07-14 | Disposition: A | Discharge status: Home / Self Care | Payer: Medicaid Other | Source: Ambulatory Visit | Attending: Family Medicine | Admitting: Family Medicine

## 2015-07-14 DIAGNOSIS — G609 Hereditary and idiopathic neuropathy, unspecified: Secondary | ICD-10-CM | POA: Diagnosis not present

## 2015-07-14 DIAGNOSIS — M4806 Spinal stenosis, lumbar region: Secondary | ICD-10-CM | POA: Insufficient documentation

## 2015-07-14 DIAGNOSIS — R531 Weakness: Secondary | ICD-10-CM | POA: Insufficient documentation

## 2015-07-14 DIAGNOSIS — L84 Corns and callosities: Secondary | ICD-10-CM

## 2015-07-14 DIAGNOSIS — I10 Essential (primary) hypertension: Secondary | ICD-10-CM | POA: Diagnosis not present

## 2015-07-14 DIAGNOSIS — E119 Type 2 diabetes mellitus without complications: Secondary | ICD-10-CM | POA: Diagnosis not present

## 2015-07-14 DIAGNOSIS — M5127 Other intervertebral disc displacement, lumbosacral region: Secondary | ICD-10-CM | POA: Diagnosis not present

## 2015-07-14 DIAGNOSIS — M79676 Pain in unspecified toe(s): Secondary | ICD-10-CM | POA: Diagnosis not present

## 2015-07-14 DIAGNOSIS — B351 Tinea unguium: Secondary | ICD-10-CM | POA: Diagnosis not present

## 2015-07-14 NOTE — Telephone Encounter (Signed)
[name] wants to know "am I going to be admitted, and am I allowed to eat before the MRI?". It is recommended that [name] contact [facility] at [phone], request to talk to radiology, and ask them those questions, as he has an appointment at [facility] radiology today at 4:00pm, and he agrees.

## 2015-07-14 NOTE — Patient Instructions (Signed)
Diabetes and Foot Care Diabetes may cause you to have problems because of poor blood supply (circulation) to your feet and legs. This may cause the skin on your feet to become thinner, break easier, and heal more slowly. Your skin may become dry, and the skin may peel and crack. You may also have nerve damage in your legs and feet causing decreased feeling in them. You may not notice minor injuries to your feet that could lead to infections or more serious problems. Taking care of your feet is one of the most important things you can do for yourself.  HOME CARE INSTRUCTIONS  Wear shoes at all times, even in the house. Do not go barefoot. Bare feet are easily injured.  Check your feet daily for blisters, cuts, and redness. If you cannot see the bottom of your feet, use a mirror or ask someone for help.  Wash your feet with warm water (do not use hot water) and mild soap. Then pat your feet and the areas between your toes until they are completely dry. Do not soak your feet as this can dry your skin.  Apply a moisturizing lotion or petroleum jelly (that does not contain alcohol and is unscented) to the skin on your feet and to dry, brittle toenails. Do not apply lotion between your toes.  Trim your toenails straight across. Do not dig under them or around the cuticle. File the edges of your nails with an emery board or nail file.  Do not cut corns or calluses or try to remove them with medicine.  Wear clean socks or stockings every day. Make sure they are not too tight. Do not wear knee-high stockings since they may decrease blood flow to your legs.  Wear shoes that fit properly and have enough cushioning. To break in new shoes, wear them for just a few hours a day. This prevents you from injuring your feet. Always look in your shoes before you put them on to be sure there are no objects inside.  Do not cross your legs. This may decrease the blood flow to your feet.  If you find a minor scrape,  cut, or break in the skin on your feet, keep it and the skin around it clean and dry. These areas may be cleansed with mild soap and water. Do not cleanse the area with peroxide, alcohol, or iodine.  When you remove an adhesive bandage, be sure not to damage the skin around it.  If you have a wound, look at it several times a day to make sure it is healing.  Do not use heating pads or hot water bottles. They may burn your skin. If you have lost feeling in your feet or legs, you may not know it is happening until it is too late.  Make sure your health care provider performs a complete foot exam at least annually or more often if you have foot problems. Report any cuts, sores, or bruises to your health care provider immediately. SEEK MEDICAL CARE IF:   You have an injury that is not healing.  You have cuts or breaks in the skin.  You have an ingrown nail.  You notice redness on your legs or feet.  You feel burning or tingling in your legs or feet.  You have pain or cramps in your legs and feet.  Your legs or feet are numb.  Your feet always feel cold. SEEK IMMEDIATE MEDICAL CARE IF:   There is increasing redness,   swelling, or pain in or around a wound.  There is a red line that goes up your leg.  Pus is coming from a wound.  You develop a fever or as directed by your health care provider.  You notice a bad smell coming from an ulcer or wound.   This information is not intended to replace advice given to you by your health care provider. Make sure you discuss any questions you have with your health care provider.   Document Released: 07/07/2000 Document Revised: 03/12/2013 Document Reviewed: 12/17/2012 Elsevier Interactive Patient Education 2016 [employer]  

## 2015-07-14 NOTE — Progress Notes (Signed)
Patient ID: [name], male   DOB: May 08, 1961, 54 y.o.   MRN: [id number]   Subjective: This patient presents for a scheduled visit complaining of thick and elongated toenails which are uncomfortable walking wearing shoes as well as a painful callus on the plantar aspect of the left hallux. He is requesting debridement of the nails as well as a callus  Objective: No open skin lesions bilaterally The toenails are elongated, incurvated, discolored, deformed, hypertrophic and tender to direct palpation 6-10 Large bleeding callus sub-left hallux that remains closed after debridement  Assessment: Symptomatic onychomycoses 6-10 Pre-ulcerative plantar callus left hallux Diabetic without applications  Plan: Debridement toenails 6-10 mechanically and electrically without any bleeding Debrided plantar callus left hallux without any bleeding  Reappoint 3 months

## 2015-07-15 ENCOUNTER — Telehealth: Payer: Self-pay | Admitting: Clinical

## 2015-07-15 ENCOUNTER — Other Ambulatory Visit: Payer: Self-pay | Admitting: Family Medicine

## 2015-07-15 DIAGNOSIS — M48061 Spinal stenosis, lumbar region without neurogenic claudication: Secondary | ICD-10-CM | POA: Insufficient documentation

## 2015-07-15 NOTE — Telephone Encounter (Signed)
[name] wants to know if the results of his MRI have been sent to [facility], as well as the outcome of the MRI. [name] is informed that [facility] has not received the results of his MRI yet, but that a message will be sent to his PCP that he is inquiring about the outcome, but that it is likely that he will have the results explained to him at his neurology appointment on 08-18-15. Pt expresses understanding, but would still like his PCP to contact him with any results.

## 2015-07-15 NOTE — Telephone Encounter (Signed)
[name] will call patient with results and f/u plan

## 2015-07-21 ENCOUNTER — Telehealth: Payer: Self-pay | Admitting: *Deleted

## 2015-07-21 ENCOUNTER — Telehealth: Payer: Self-pay | Admitting: Clinical

## 2015-07-21 NOTE — Telephone Encounter (Signed)
-----   Message from Boykin Nearing, MD sent at 07/15/2015  8:58 AM EST ----- MRI of low back with spinal stenosis on multiple levels Neurosurgery referral placed

## 2015-07-21 NOTE — Telephone Encounter (Signed)
Date of birth verified by pt  MRI results given  Low back with spinal stenosis on multiple levels  Referral neurosurgery Pt verbalized understanding

## 2015-07-21 NOTE — Telephone Encounter (Signed)
[name] wants to know the results of his MRI; he is told that [name] will contact him back with results, and he agrees to wait for results.

## 2015-08-02 ENCOUNTER — Other Ambulatory Visit: Payer: Self-pay | Admitting: Family Medicine

## 2015-08-18 ENCOUNTER — Encounter: Payer: Medicaid Other | Admitting: Neurology

## 2015-08-26 ENCOUNTER — Telehealth: Payer: Self-pay | Admitting: Clinical

## 2015-08-26 NOTE — Telephone Encounter (Signed)
Pt says he has not heard from [facility] regarding an appointment, and he says he feels like he has "knots in his head" and that "it is getting worse", and wants to know when he is scheduled to have his surgery. [facility] is called, after hours, to let them know that if Mr. Hoefling has an upcoming appointment, to please call him at [phone] to inform him about his appointment. [facility] says that they will contact pt in the morning with that information.

## 2015-09-02 ENCOUNTER — Other Ambulatory Visit: Payer: Self-pay | Admitting: Internal Medicine

## 2015-09-02 ENCOUNTER — Other Ambulatory Visit: Payer: Self-pay | Admitting: Family Medicine

## 2015-09-21 ENCOUNTER — Telehealth: Payer: Self-pay | Admitting: Clinical

## 2015-09-21 DIAGNOSIS — M48061 Spinal stenosis, lumbar region without neurogenic claudication: Secondary | ICD-10-CM

## 2015-09-21 NOTE — Addendum Note (Signed)
Addended by: Boykin Nearing on: 09/21/2015 04:55 PM   Modules accepted: Orders

## 2015-09-21 NOTE — Telephone Encounter (Signed)
[name] wants [facility] to know that he was informed by Dr. Ronnald Ramp, after looking over his past MRI, that "surgery will not help", and that "there is nothing that can be done". [name] is requesting a second opinion; would also like to be seen by by his PCP about the bumps on his head, saying "sometimes there is pus coming out of them and sometimes they bleed", and he wants to know if there is anything that he can do about the bumps on his head. Pt is informed that this information will be sent to his PCP, Dr. Adrian Blackwater, and that [facility] schedulers will call him back to set up an appointment. [name] says he understands and is in agreement with that plan.

## 2015-09-21 NOTE — Telephone Encounter (Signed)
Please call patient,   Another neurosurgery referral placed  Patient likely has folliculitis in the scalp Recommendations: F/u appt to determine if antibiotics needed  Avoid shaving, trim only Hot compresses on scalp as needed

## 2015-09-24 NOTE — Telephone Encounter (Signed)
Pt has F/U appointment

## 2015-10-03 ENCOUNTER — Inpatient Hospital Stay (HOSPITAL_COMMUNITY)
Admission: EM | Admit: 2015-10-03 | Discharge: 2015-10-05 | DRG: 195 | Disposition: A | Discharge status: Home / Self Care | Payer: Medicaid Other | Attending: Internal Medicine | Admitting: Internal Medicine

## 2015-10-03 ENCOUNTER — Encounter (HOSPITAL_COMMUNITY): Payer: Self-pay | Admitting: Family Medicine

## 2015-10-03 ENCOUNTER — Emergency Department (HOSPITAL_COMMUNITY): Payer: Medicaid Other

## 2015-10-03 DIAGNOSIS — J1000 Influenza due to other identified influenza virus with unspecified type of pneumonia: Secondary | ICD-10-CM | POA: Diagnosis present

## 2015-10-03 DIAGNOSIS — Z7984 Long term (current) use of oral hypoglycemic drugs: Secondary | ICD-10-CM

## 2015-10-03 DIAGNOSIS — F1410 Cocaine abuse, uncomplicated: Secondary | ICD-10-CM | POA: Diagnosis present

## 2015-10-03 DIAGNOSIS — Z833 Family history of diabetes mellitus: Secondary | ICD-10-CM | POA: Diagnosis not present

## 2015-10-03 DIAGNOSIS — Z6834 Body mass index (BMI) 34.0-34.9, adult: Secondary | ICD-10-CM | POA: Diagnosis not present

## 2015-10-03 DIAGNOSIS — E1142 Type 2 diabetes mellitus with diabetic polyneuropathy: Secondary | ICD-10-CM | POA: Diagnosis present

## 2015-10-03 DIAGNOSIS — I10 Essential (primary) hypertension: Secondary | ICD-10-CM | POA: Diagnosis present

## 2015-10-03 DIAGNOSIS — J189 Pneumonia, unspecified organism: Secondary | ICD-10-CM | POA: Diagnosis present

## 2015-10-03 DIAGNOSIS — G8929 Other chronic pain: Secondary | ICD-10-CM | POA: Diagnosis present

## 2015-10-03 DIAGNOSIS — Z803 Family history of malignant neoplasm of breast: Secondary | ICD-10-CM

## 2015-10-03 DIAGNOSIS — E119 Type 2 diabetes mellitus without complications: Secondary | ICD-10-CM

## 2015-10-03 DIAGNOSIS — Z8249 Family history of ischemic heart disease and other diseases of the circulatory system: Secondary | ICD-10-CM | POA: Diagnosis not present

## 2015-10-03 DIAGNOSIS — Z823 Family history of stroke: Secondary | ICD-10-CM

## 2015-10-03 DIAGNOSIS — E669 Obesity, unspecified: Secondary | ICD-10-CM | POA: Diagnosis present

## 2015-10-03 DIAGNOSIS — F17210 Nicotine dependence, cigarettes, uncomplicated: Secondary | ICD-10-CM | POA: Diagnosis present

## 2015-10-03 DIAGNOSIS — E785 Hyperlipidemia, unspecified: Secondary | ICD-10-CM | POA: Diagnosis present

## 2015-10-03 DIAGNOSIS — E1100 Type 2 diabetes mellitus with hyperosmolarity without nonketotic hyperglycemic-hyperosmolar coma (NKHHC): Secondary | ICD-10-CM

## 2015-10-03 DIAGNOSIS — R509 Fever, unspecified: Secondary | ICD-10-CM

## 2015-10-03 DIAGNOSIS — Z6836 Body mass index (BMI) 36.0-36.9, adult: Secondary | ICD-10-CM

## 2015-10-03 DIAGNOSIS — R0600 Dyspnea, unspecified: Secondary | ICD-10-CM | POA: Diagnosis not present

## 2015-10-03 LAB — URINALYSIS, ROUTINE W REFLEX MICROSCOPIC
BILIRUBIN URINE: NEGATIVE
Glucose, UA: NEGATIVE mg/dL
KETONES UR: 15 mg/dL — AB
Leukocytes, UA: NEGATIVE
NITRITE: NEGATIVE
Protein, ur: >300 mg/dL — AB
Specific Gravity, Urine: 1.025 (ref 1.005–1.030)
pH: 5 (ref 5.0–8.0)

## 2015-10-03 LAB — I-STAT CG4 LACTIC ACID, ED
LACTIC ACID, VENOUS: 1.31 mmol/L (ref 0.5–2.0)
LACTIC ACID, VENOUS: 1.44 mmol/L (ref 0.5–2.0)
LACTIC ACID, VENOUS: 0.99 mmol/L (ref 0.5–2.0)

## 2015-10-03 LAB — COMPREHENSIVE METABOLIC PANEL
ALK PHOS: 55 U/L (ref 38–126)
ALT: 17 U/L (ref 17–63)
AST: 28 U/L (ref 15–41)
Albumin: 4.2 g/dL (ref 3.5–5.0)
Anion gap: 11 (ref 5–15)
BUN: 12 mg/dL (ref 6–20)
CALCIUM: 9.4 mg/dL (ref 8.9–10.3)
CO2: 25 mmol/L (ref 22–32)
CREATININE: 1.16 mg/dL (ref 0.61–1.24)
Chloride: 104 mmol/L (ref 101–111)
GFR calc non Af Amer: >60 mL/min (ref >60)
GLUCOSE: 144 mg/dL — AB (ref 65–99)
Potassium: 4.4 mmol/L (ref 3.5–5.1)
SODIUM: 140 mmol/L (ref 135–145)
Total Bilirubin: 0.7 mg/dL (ref 0.3–1.2)
Total Protein: 8.3 g/dL — ABNORMAL HIGH (ref 6.5–8.1)

## 2015-10-03 LAB — URINE MICROSCOPIC-ADD ON

## 2015-10-03 LAB — RAPID URINE DRUG SCREEN, HOSP PERFORMED
AMPHETAMINES: NOT DETECTED
BENZODIAZEPINES: NOT DETECTED
Barbiturates: NOT DETECTED
COCAINE: POSITIVE — AB
Opiates: NOT DETECTED
Tetrahydrocannabinol: NOT DETECTED

## 2015-10-03 LAB — BRAIN NATRIURETIC PEPTIDE: B Natriuretic Peptide: 151.1 pg/mL — ABNORMAL HIGH (ref 0.0–100.0)

## 2015-10-03 LAB — CBC WITH DIFFERENTIAL/PLATELET
BASOS PCT: 0 %
Basophils Absolute: 0 10*3/uL (ref 0.0–0.1)
EOS ABS: 0.1 10*3/uL (ref 0.0–0.7)
EOS PCT: 1 %
HCT: 45.2 % (ref 39.0–52.0)
Hemoglobin: 14.5 g/dL (ref 13.0–17.0)
LYMPHS ABS: 1.1 10*3/uL (ref 0.7–4.0)
Lymphocytes Relative: 12 %
MCH: 26.9 pg (ref 26.0–34.0)
MCHC: 32.1 g/dL (ref 30.0–36.0)
MCV: 83.7 fL (ref 78.0–100.0)
MONO ABS: 0.6 10*3/uL (ref 0.1–1.0)
MONOS PCT: 7 %
Neutro Abs: 6.9 10*3/uL (ref 1.7–7.7)
Neutrophils Relative %: 80 %
Platelets: 209 10*3/uL (ref 150–400)
RBC: 5.4 MIL/uL (ref 4.22–5.81)
RDW: 13.8 % (ref 11.5–15.5)
WBC: 8.7 10*3/uL (ref 4.0–10.5)

## 2015-10-03 LAB — GLUCOSE, CAPILLARY
Glucose-Capillary: 135 mg/dL — ABNORMAL HIGH (ref 65–99)
Glucose-Capillary: 83 mg/dL (ref 65–99)

## 2015-10-03 LAB — TROPONIN I: Troponin I: <0.03 ng/mL (ref <0.031)

## 2015-10-03 LAB — STREP PNEUMONIAE URINARY ANTIGEN: Strep Pneumo Urinary Antigen: NEGATIVE

## 2015-10-03 MED ORDER — GLIPIZIDE 5 MG PO TABS
10.0000 mg | ORAL_TABLET | Freq: Two times a day (BID) | ORAL | Status: DC
  Administered 2015-10-03 – 2015-10-05 (×4): 10 mg via ORAL
  Filled 2015-10-03 (×4): qty 2

## 2015-10-03 MED ORDER — IBUPROFEN 200 MG PO TABS
600.0000 mg | ORAL_TABLET | Freq: Four times a day (QID) | ORAL | Status: DC | PRN
  Administered 2015-10-03: 600 mg via ORAL
  Filled 2015-10-03 (×2): qty 3

## 2015-10-03 MED ORDER — ACETAMINOPHEN 325 MG PO TABS
650.0000 mg | ORAL_TABLET | Freq: Four times a day (QID) | ORAL | Status: DC | PRN
  Administered 2015-10-04 (×2): 650 mg via ORAL
  Filled 2015-10-03 (×2): qty 2

## 2015-10-03 MED ORDER — ALBUTEROL SULFATE (2.5 MG/3ML) 0.083% IN NEBU
5.0000 mg | INHALATION_SOLUTION | Freq: Once | RESPIRATORY_TRACT | Status: AC
  Administered 2015-10-03: 5 mg via RESPIRATORY_TRACT
  Filled 2015-10-03: qty 6

## 2015-10-03 MED ORDER — DEXTROSE 5 % IV SOLN
2.0000 g | INTRAVENOUS | Status: DC
  Administered 2015-10-03: 2 g via INTRAVENOUS
  Filled 2015-10-03: qty 2

## 2015-10-03 MED ORDER — LISINOPRIL 20 MG PO TABS
20.0000 mg | ORAL_TABLET | Freq: Every day | ORAL | Status: DC
Start: 2015-10-03 — End: 2015-10-04
  Administered 2015-10-03: 20 mg via ORAL
  Filled 2015-10-03: qty 1

## 2015-10-03 MED ORDER — DEXTROSE 5 % IV SOLN
500.0000 mg | INTRAVENOUS | Status: DC
  Administered 2015-10-03: 500 mg via INTRAVENOUS
  Filled 2015-10-03: qty 500

## 2015-10-03 MED ORDER — GABAPENTIN 300 MG PO CAPS
300.0000 mg | ORAL_CAPSULE | Freq: Three times a day (TID) | ORAL | Status: DC
  Administered 2015-10-03 – 2015-10-05 (×5): 300 mg via ORAL
  Filled 2015-10-03 (×5): qty 1

## 2015-10-03 MED ORDER — IBUPROFEN 400 MG PO TABS
600.0000 mg | ORAL_TABLET | Freq: Once | ORAL | Status: AC
  Administered 2015-10-03: 600 mg via ORAL
  Filled 2015-10-03: qty 1

## 2015-10-03 MED ORDER — DEXTROSE 5 % IV SOLN
500.0000 mg | Freq: Once | INTRAVENOUS | Status: DC
  Filled 2015-10-03: qty 500

## 2015-10-03 MED ORDER — DEXTROSE 5 % IV SOLN: 1.0000 g | Freq: Once | INTRAVENOUS | Status: DC

## 2015-10-03 MED ORDER — OSELTAMIVIR PHOSPHATE 75 MG PO CAPS
75.0000 mg | ORAL_CAPSULE | Freq: Two times a day (BID) | ORAL | Status: DC
  Administered 2015-10-03 – 2015-10-05 (×4): 75 mg via ORAL
  Filled 2015-10-03 (×5): qty 1

## 2015-10-03 MED ORDER — SODIUM CHLORIDE 0.9 % IV SOLN
INTRAVENOUS | Status: DC
  Administered 2015-10-03: 22:00:00 via INTRAVENOUS

## 2015-10-03 MED ORDER — INSULIN ASPART 100 UNIT/ML ~~LOC~~ SOLN: 0.0000 [IU] | Freq: Every day | SUBCUTANEOUS | Status: DC

## 2015-10-03 MED ORDER — ATORVASTATIN CALCIUM 40 MG PO TABS
40.0000 mg | ORAL_TABLET | Freq: Every day | ORAL | Status: DC
  Administered 2015-10-03 – 2015-10-05 (×3): 40 mg via ORAL
  Filled 2015-10-03 (×3): qty 1

## 2015-10-03 MED ORDER — DEXTROSE 5 % IV SOLN
2.0000 g | INTRAVENOUS | Status: DC
  Filled 2015-10-03: qty 2

## 2015-10-03 MED ORDER — SODIUM CHLORIDE 0.9 % IV BOLUS (SEPSIS)
1000.0000 mL | INTRAVENOUS | Status: AC
  Administered 2015-10-03 (×4): 1000 mL via INTRAVENOUS

## 2015-10-03 MED ORDER — ENOXAPARIN SODIUM 40 MG/0.4ML ~~LOC~~ SOLN
40.0000 mg | SUBCUTANEOUS | Status: DC
  Administered 2015-10-03 – 2015-10-04 (×2): 40 mg via SUBCUTANEOUS
  Filled 2015-10-03 (×2): qty 0.4

## 2015-10-03 MED ORDER — INSULIN ASPART 100 UNIT/ML ~~LOC~~ SOLN
0.0000 [IU] | Freq: Three times a day (TID) | SUBCUTANEOUS | Status: DC
  Administered 2015-10-03: 1 [IU] via SUBCUTANEOUS

## 2015-10-03 MED ORDER — ACETAMINOPHEN 325 MG PO TABS
650.0000 mg | ORAL_TABLET | Freq: Once | ORAL | Status: AC | PRN
  Administered 2015-10-03: 650 mg via ORAL

## 2015-10-03 NOTE — H&P (Signed)
[facility] History and Physical  [name] FHL:[id number] DOB: 1960/08/30 DOA: 10/03/2015  Referring physician: er PCP: Minerva Ends, MD   Chief Complaint: cough/fever  HPI: [name] is a 55 y.o. male  With PMHx of DM, HTN, chronic pain who lives at home.  He has a caregiver at home for 1 1/2 hours/day.  Recent sick contact was a caregiver with URI type symptoms.  He felt ok yesterday, but last night developed cough with white colored sputum.  Fever of 102.  No nausea, no vomiting, no diarrhea.    In the ER, chest x ray showed left base atelectasis and ER doc was suspicious for a PNA.  Flu swab was ordered for rule out.  EKG had some t wave changes as well so  BNP, echo, CE were ordered.  Patient denies chest pain nor recent cocaine use.   Review of Systems:  All systems reviewed, negative unless stated above  Past Medical History  Diagnosis Date  . Diabetes mellitus   . Hypertension   . 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 Cocaine abuse   . Chronic pain   . Alcohol abuse   . Tobacco abuse   . Gait instability   . Chronic arm pain   . Chronic leg pain   . Peripheral edema     chronic LUE   Past Surgical History  Procedure Laterality Date  . Left arm skin graft    . Left heart catheterization with coronary angiogram Bilateral 12/26/2013    Procedure: LEFT HEART CATHETERIZATION WITH CORONARY ANGIOGRAM;  Surgeon: Burnell Blanks, MD;  Location: [facility] CATH LAB;  Service: Cardiovascular;  Laterality: Bilateral;  . Cardiac catheterization  12/27/2013  . Cardiac catheterization  12/27/2013    Impression: 1. 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 Moderate disease in the small caliber intermediate branch, 3. Normal LV systolic function   Social History:  reports that he has been smoking Cigarettes.  He has been smoking about 0.25 packs per day. He has never used smokeless tobacco. He reports that he drinks alcohol. He reports that he  does not use illicit drugs.  No Known Allergies  Family History  Problem Relation Age of Onset  . Hypertension Mother   . Diabetes Mother   . Cancer Mother     breast cancer   . Hypertension Father   . Heart disease Father   . Stroke Father   . Hypertension Sister     Prior to Admission medications   Medication Sig Start Date End Date Taking? Authorizing Provider  atorvastatin (LIPITOR) 40 MG tablet Take 1 tablet (40 mg total) by mouth daily. Must have office visit 09/02/15   Boykin Nearing, MD  DOK 100 MG capsule TAKE 1 CAPSULE (100 MG TOTAL) BY MOUTH TWO   (TWO) TIMES DAILY. 08/03/15   Josalyn Funches, MD  gabapentin (NEURONTIN) 300 MG capsule Take 1 capsule (300 mg total) by mouth 3 (three) times daily. Needs office visit for refills 09/02/15   Josalyn Funches, MD  glipiZIDE (GLUCOTROL) 10 MG tablet TAKE 1 TABLET (10 MG TOTAL) BY MOUTH TWO   (TWO) TIMES DAILY BEFORE A MEAL. 06/21/15   Josalyn Funches, MD  glucose monitoring kit (FREESTYLE) monitoring kit 1 each by Does not apply route as needed for other. Dispense any model that is covered- dispense testing supplies for Q AC/ HS accuchecks- 1 month supply with one refil. 05/22/14   Lorayne Marek, MD  lisinopril (PRINIVIL,ZESTRIL) 20 MG tablet Take 1 tablet (20 mg total) by mouth daily. Must have office visit 09/02/15   Boykin Nearing, MD  metFORMIN (GLUCOPHAGE) 1000 MG tablet Take 1 tablet (1,000 mg total) by mouth 2 (two) times daily with a meal. Resume on 04/29/15. 04/29/15   Jonetta Osgood, MD  simvastatin (ZOCOR) 20 MG tablet TAKE 1 TABLET (20 MG TOTAL) BY MOUTH DAILY AT 6 PM. 06/21/15   Boykin Nearing, MD   Physical Exam: Filed Vitals:   10/03/15 1339 10/03/15 1345 10/03/15 1400 10/03/15 1415  BP: 159/68 144/66 153/61 153/54  Pulse: 103 106 111 118  Temp: 103.2 F (39.6 C)     TempSrc: Oral     Resp: 28     SpO2: 95% 96% 96% 100%    Wt Readings from Last 3 Encounters:  07/07/15 124.739 kg (275 lb)  06/07/15 123.378 kg  (272 lb)  05/05/15 120.203 kg (265 lb)    General:  appears ill, wash cloth above face Eyes: PERRL, normal lids, irises & conjunctiva ENT: grossly normal hearing, lips & tongue Neck: no LAD, masses or thyromegaly Cardiovascular: tachy, min LE edema. Respiratory: no wheezing, rales left base Abdomen: soft, ntnd, obese Skin: no rash or induration seen on limited exam Musculoskeletal: grossly normal tone BUE/BLE Psychiatric: grossly normal mood and affect, speech fluent and appropriate Neurologic: grossly non-focal.          Labs on Admission:  Basic Metabolic Panel:  Recent Labs Lab 10/03/15 1319  NA 140  K 4.4  CL 104  CO2 25  GLUCOSE 144*  BUN 12  CREATININE 1.16  CALCIUM 9.4   Liver Function Tests:  Recent Labs Lab 10/03/15 1319  AST 28  ALT 17  ALKPHOS 55  BILITOT 0.7  PROT 8.3*  ALBUMIN 4.2   No results for input(s): LIPASE, AMYLASE in the last 168 hours. No results for input(s): AMMONIA in the last 168 hours. CBC:  Recent Labs Lab 10/03/15 1319  WBC 8.7  NEUTROABS 6.9  HGB 14.5  HCT 45.2  MCV 83.7  PLT 209   Cardiac Enzymes: No results for input(s): CKTOTAL, CKMB, CKMBINDEX, TROPONINI in the last 168 hours.  BNP (last 3 results) No results for input(s): BNP in the last 8760 hours.  ProBNP (last 3 results) No results for input(s): PROBNP in the last 8760 hours.  CBG: No results for input(s): GLUCAP in the last 168 hours.  Radiological Exams on Admission: Dg Chest 2 View  10/03/2015  CLINICAL DATA:  Cough, weakness since yesterday. EXAM: CHEST  2 VIEW COMPARISON:  04/26/2015 FINDINGS: Cardiomegaly with vascular congestion. Linear densities in the left base likely reflect atelectasis. No confluent opacity on the right. No overt edema. No effusions or acute bony abnormality. IMPRESSION: Cardiomegaly with vascular congestion.  Left base atelectasis. Electronically Signed   By: Rolm Baptise M.D.   On: 10/03/2015 14:46     EKG -change from  previous: inferior ST changes   Assessment/Plan Active Problems:   Diabetes mellitus, type 2 (HCC)   OBESITY NOS   Essential hypertension   Pneumonia   Pneumonia -IV abx -r/o flu- start tamiflu Lactic acid negative  Fever -due to above -can alternate ibuprofen/tylenol  HTN -resume home meds  DM -SSI Diabetic diet  Tobacco abuse/occasional alcohol/no cocaine currently -encouraged cessation  t wave changes -no chest pain -tele bed -cycle CE -echo -BNP  Code Status: full DVT Prophylaxis: Family Communication: patient Disposition Plan:   Time spent: 75 min  [place] [facility] Pager [phone]

## 2015-10-03 NOTE — ED Provider Notes (Signed)
CSN: [id number]     Arrival date & time 10/03/15  1244 History   First MD Initiated Contact with Patient 10/03/15 1340     Chief Complaint  Patient presents with  . Weakness  . Cough     (Consider location/radiation/quality/duration/timing/severity/associated sxs/prior Treatment) HPI Complains of generalized weakness and cough productive of white sputum for the past 36 hours. No vomiting. No treatment prior to coming here. Associated symptoms include shortness of breath. Nothing makes symptoms better or worse. No headache no abdominal pain. Past Medical History  Diagnosis Date  . Diabetes mellitus   . Hypertension   . 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 Cocaine abuse   . Chronic pain   . Alcohol abuse   . Tobacco abuse   . Gait instability   . Chronic arm pain   . Chronic leg pain   . Peripheral edema     chronic LUE   Past Surgical History  Procedure Laterality Date  . Left arm skin graft    . Left heart catheterization with coronary angiogram Bilateral 12/26/2013    Procedure: LEFT HEART CATHETERIZATION WITH CORONARY ANGIOGRAM;  Surgeon: Burnell Blanks, MD;  Location: [facility] CATH LAB;  Service: Cardiovascular;  Laterality: Bilateral;  . Cardiac catheterization  12/27/2013  . Cardiac catheterization  12/27/2013    Impression: 1. 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 Moderate disease in the small caliber intermediate branch, 3. Normal LV systolic function   Family History  Problem Relation Age of Onset  . Hypertension Mother   . Diabetes Mother   . Cancer Mother     breast cancer   . Hypertension Father   . Heart disease Father   . Stroke Father   . Hypertension Sister    Social History  Substance Use Topics  . Smoking status: Current Every Day Smoker -- 0.25 packs/day    Types: Cigarettes  . Smokeless tobacco: Never Used  . Alcohol Use: 0.0 oz/week    0 Standard drinks or equivalent per week     Comment: occassional    Review of  Systems  Constitutional: Positive for fever.  HENT: Negative.   Respiratory: Positive for cough and shortness of breath.   Cardiovascular: Negative.   Gastrointestinal: Negative.   Musculoskeletal: Positive for gait problem.       Walks with cane .  Skin: Negative.   Allergic/Immunologic: Positive for immunocompromised state.       Diabetic  Neurological: Positive for weakness.       Chronic left leg weakness.  Psychiatric/Behavioral: Negative.       Allergies  Review of patient's allergies indicates no known allergies.  Home Medications   Prior to Admission medications   Medication Sig Start Date End Date Taking? Authorizing Provider  atorvastatin (LIPITOR) 40 MG tablet Take 1 tablet (40 mg total) by mouth daily. Must have office visit 09/02/15   Boykin Nearing, MD  DOK 100 MG capsule TAKE 1 CAPSULE (100 MG TOTAL) BY MOUTH TWO   (TWO) TIMES DAILY. 08/03/15   Josalyn Funches, MD  gabapentin (NEURONTIN) 300 MG capsule Take 1 capsule (300 mg total) by mouth 3 (three) times daily. Needs office visit for refills 09/02/15   Josalyn Funches, MD  glipiZIDE (GLUCOTROL) 10 MG tablet TAKE 1 TABLET (10 MG TOTAL) BY MOUTH TWO   (TWO) TIMES DAILY BEFORE A MEAL. 06/21/15   Josalyn Funches, MD  glucose monitoring kit (FREESTYLE) monitoring kit 1 each by Does not  apply route as needed for other. Dispense any model that is covered- dispense testing supplies for Q AC/ HS accuchecks- 1 month supply with one refil. 05/22/14   Lorayne Marek, MD  lisinopril (PRINIVIL,ZESTRIL) 20 MG tablet Take 1 tablet (20 mg total) by mouth daily. Must have office visit 09/02/15   Boykin Nearing, MD  metFORMIN (GLUCOPHAGE) 1000 MG tablet Take 1 tablet (1,000 mg total) by mouth 2 (two) times daily with a meal. Resume on 04/29/15. 04/29/15   Jonetta Osgood, MD  simvastatin (ZOCOR) 20 MG tablet TAKE 1 TABLET (20 MG TOTAL) BY MOUTH DAILY AT 6 PM. 06/21/15   Josalyn Funches, MD   BP 144/66 mmHg  Pulse 106  Temp(Src) 103.2 F  (39.6 C) (Oral)  Resp 28  SpO2 96% Physical Exam  Constitutional:  Ill appearing  HENT:  Head: Normocephalic and atraumatic.  Eyes: Conjunctivae are normal. Pupils are equal, round, and reactive to light.  Neck: Neck supple. No tracheal deviation present. No thyromegaly present.  Cardiovascular: Normal rate and regular rhythm.   No murmur heard. Pulmonary/Chest: Effort normal. He has rales.  Rales posteriorly bilaterally  Abdominal: Soft. Bowel sounds are normal. He exhibits no distension. There is no tenderness.  Obese  Musculoskeletal: Normal range of motion. He exhibits no edema or tenderness.  Neurological: He is alert. Coordination normal.  Skin: Skin is warm and dry. No rash noted.  Psychiatric: He has a normal mood and affect.  Nursing note and vitals reviewed.   ED Course  Procedures (including critical care time) Labs Review Labs Reviewed  COMPREHENSIVE METABOLIC PANEL - Abnormal; Notable for the following:    Glucose, Bld 144 (*)    Total Protein 8.3 (*)    All other components within normal limits  CBC WITH DIFFERENTIAL/PLATELET  URINALYSIS, ROUTINE W REFLEX MICROSCOPIC (NOT AT [facility])  I-STAT CG4 LACTIC ACID, ED    Imaging Review No results found. I have personally reviewed and evaluated these images and lab results as part of my medical decision-making.   EKG Interpretation None     Chest x-ray viewed by me. Results for orders placed or performed during the hospital encounter of 10/03/15  Comprehensive metabolic panel  Result Value Ref Range   Sodium 140 135 - 145 mmol/L   Potassium 4.4 3.5 - 5.1 mmol/L   Chloride 104 101 - 111 mmol/L   CO2 25 22 - 32 mmol/L   Glucose, Bld 144 (H) 65 - 99 mg/dL   BUN 12 6 - 20 mg/dL   Creatinine, Ser 1.16 0.61 - 1.24 mg/dL   Calcium 9.4 8.9 - 10.3 mg/dL   Total Protein 8.3 (H) 6.5 - 8.1 g/dL   Albumin 4.2 3.5 - 5.0 g/dL   AST 28 15 - 41 U/L   ALT 17 17 - 63 U/L   Alkaline Phosphatase 55 38 - 126 U/L   Total  Bilirubin 0.7 0.3 - 1.2 mg/dL   GFR calc non Af Amer >60 >60 mL/min   GFR calc Af Amer >60 >60 mL/min   Anion gap 11 5 - 15  CBC with Differential  Result Value Ref Range   WBC 8.7 4.0 - 10.5 K/uL   RBC 5.40 4.22 - 5.81 MIL/uL   Hemoglobin 14.5 13.0 - 17.0 g/dL   HCT 45.2 39.0 - 52.0 %   MCV 83.7 78.0 - 100.0 fL   MCH 26.9 26.0 - 34.0 pg   MCHC 32.1 30.0 - 36.0 g/dL   RDW 13.8 11.5 - 15.5 %  Platelets 209 150 - 400 K/uL   Neutrophils Relative % 80 %   Neutro Abs 6.9 1.7 - 7.7 K/uL   Lymphocytes Relative 12 %   Lymphs Abs 1.1 0.7 - 4.0 K/uL   Monocytes Relative 7 %   Monocytes Absolute 0.6 0.1 - 1.0 K/uL   Eosinophils Relative 1 %   Eosinophils Absolute 0.1 0.0 - 0.7 K/uL   Basophils Relative 0 %   Basophils Absolute 0.0 0.0 - 0.1 K/uL  I-Stat CG4 Lactic Acid, ED (Not at [facility])  Result Value Ref Range   Lactic Acid, Venous 1.31 0.5 - 2.0 mmol/L  I-Stat CG4 Lactic Acid, ED  (not at  [facility])  Result Value Ref Range   Lactic Acid, Venous 1.44 0.5 - 2.0 mmol/L   Dg Chest 2 View  10/03/2015  CLINICAL DATA:  Cough, weakness since yesterday. EXAM: CHEST  2 VIEW COMPARISON:  04/26/2015 FINDINGS: Cardiomegaly with vascular congestion. Linear densities in the left base likely reflect atelectasis. No confluent opacity on the right. No overt edema. No effusions or acute bony abnormality. IMPRESSION: Cardiomegaly with vascular congestion.  Left base atelectasis. Electronically Signed   By: Rolm Baptise M.D.   On: 10/03/2015 14:46    MDM  Code sepsis called based on surgical criteria fever, tachycardia, tachypnea likely source respiratory Final diagnoses:  None   patient has new inferior ST segment changes, however doubt acute coronary syndrome in light of cough, fever. Will check troponin. Strongly suspect pneumonia in light of cough fever atelectasis seen on chest x-ray is likely infiltrate. Will treat for community acquired pneumonia Dr. Eliseo Squires consulted and will arrange for inpatient  stay Dx #1 community Acquired Pneumonia #2 hyperglycemia    Orlie Dakin, MD 10/03/15 1547

## 2015-10-03 NOTE — ED Provider Notes (Signed)
CSN: [id number]     Arrival date & time 10/03/15  1244 History   First MD Initiated Contact with Patient 10/03/15 1340     Chief Complaint  Patient presents with  . Weakness  . Cough     (Consider location/radiation/quality/duration/timing/severity/associated sxs/prior Treatment) HPI  Past Medical History  Diagnosis Date  . Diabetes mellitus   . Hypertension   . 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 Cocaine abuse   . Chronic pain   . Alcohol abuse   . Tobacco abuse   . Gait instability   . Chronic arm pain   . Chronic leg pain   . Peripheral edema     chronic LUE   Past Surgical History  Procedure Laterality Date  . Left arm skin graft    . Left heart catheterization with coronary angiogram Bilateral 12/26/2013    Procedure: LEFT HEART CATHETERIZATION WITH CORONARY ANGIOGRAM;  Surgeon: Burnell Blanks, MD;  Location: [facility] CATH LAB;  Service: Cardiovascular;  Laterality: Bilateral;  . Cardiac catheterization  12/27/2013  . Cardiac catheterization  12/27/2013    Impression: 1. 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 Moderate disease in the small caliber intermediate branch, 3. Normal LV systolic function   Family History  Problem Relation Age of Onset  . Hypertension Mother   . Diabetes Mother   . Cancer Mother     breast cancer   . Hypertension Father   . Heart disease Father   . Stroke Father   . Hypertension Sister    Social History  Substance Use Topics  . Smoking status: Current Every Day Smoker -- 0.25 packs/day    Types: Cigarettes  . Smokeless tobacco: Never Used  . Alcohol Use: 0.0 oz/week    0 Standard drinks or equivalent per week     Comment: occassional    Review of Systems    Allergies  Review of patient's allergies indicates no known allergies.  Home Medications   Prior to Admission medications   Medication Sig Start Date End Date Taking? Authorizing Provider  atorvastatin (LIPITOR) 40 MG tablet Take 1 tablet (40  mg total) by mouth daily. Must have office visit 09/02/15   Boykin Nearing, MD  DOK 100 MG capsule TAKE 1 CAPSULE (100 MG TOTAL) BY MOUTH TWO   (TWO) TIMES DAILY. 08/03/15   Josalyn Funches, MD  gabapentin (NEURONTIN) 300 MG capsule Take 1 capsule (300 mg total) by mouth 3 (three) times daily. Needs office visit for refills 09/02/15   Josalyn Funches, MD  glipiZIDE (GLUCOTROL) 10 MG tablet TAKE 1 TABLET (10 MG TOTAL) BY MOUTH TWO   (TWO) TIMES DAILY BEFORE A MEAL. 06/21/15   Josalyn Funches, MD  glucose monitoring kit (FREESTYLE) monitoring kit 1 each by Does not apply route as needed for other. Dispense any model that is covered- dispense testing supplies for Q AC/ HS accuchecks- 1 month supply with one refil. 05/22/14   Lorayne Marek, MD  lisinopril (PRINIVIL,ZESTRIL) 20 MG tablet Take 1 tablet (20 mg total) by mouth daily. Must have office visit 09/02/15   Boykin Nearing, MD  metFORMIN (GLUCOPHAGE) 1000 MG tablet Take 1 tablet (1,000 mg total) by mouth 2 (two) times daily with a meal. Resume on 04/29/15. 04/29/15   Jonetta Osgood, MD  simvastatin (ZOCOR) 20 MG tablet TAKE 1 TABLET (20 MG TOTAL) BY MOUTH DAILY AT 6 PM. 06/21/15   Josalyn Funches, MD   BP 159/68 mmHg  Pulse 103  Temp(Src) 102.7 F (39.3 C)  Resp 24  SpO2 95% Physical Exam  ED Course  Procedures (including critical care time) Labs Review Labs Reviewed  CBC WITH DIFFERENTIAL/PLATELET  COMPREHENSIVE METABOLIC PANEL  URINALYSIS, ROUTINE W REFLEX MICROSCOPIC (NOT AT [facility])  I-STAT CG4 LACTIC ACID, ED    Imaging Review No results found. I have personally reviewed and evaluated these images and lab results as part of my medical decision-making.   EKG Interpretation None      MDM   Final diagnoses:  None    Please delete. Duplicate note    Orlie Dakin, MD 10/03/15 1800

## 2015-10-03 NOTE — ED Notes (Signed)
Attempted report to 5N. °

## 2015-10-03 NOTE — ED Notes (Signed)
Pt here for cough, weakness since yesterday. Denies N,V,D. Pt fever of 102.7 in triage.

## 2015-10-03 NOTE — ED Notes (Signed)
Pt transported to xray 

## 2015-10-03 NOTE — Progress Notes (Signed)
Pharmacy Antibiotic Note [name] is a 55 y.o. male admitted on 10/03/2015 with Tmax 102.7, cough and concern for CAP. Pharmacy has been consulted for Ceftriaxone and Azithromycin dosing.  Plan: 1. Ceftriaxone 2 grams IV q 24 hours 2. Azithromycin 500 mg IV q 24 hours  3. F/u cx data, clinical response, and other pertinent las/levels and deescalate abx as feasible    Temp (24hrs), Avg:103 F (39.4 C), Min:102.7 F (39.3 C), Max:103.2 F (39.6 C)   Recent Labs Lab 10/03/15 1319 10/03/15 1328  WBC 8.7  --   CREATININE 1.16  --   LATICACIDVEN  --  1.31      No Known Allergies  Antimicrobials this admission: 3/12 Ceftriaxone >>  3/12 Azithromycin  >>   Dose adjustments this admission: n/a  Microbiology results: Px  Thank you for allowing pharmacy to be a part of this patient's care.  Vincenza Hews, PharmD, BCPS 10/03/2015, 2:27 PM Pager: [phone]

## 2015-10-04 DIAGNOSIS — J189 Pneumonia, unspecified organism: Secondary | ICD-10-CM

## 2015-10-04 DIAGNOSIS — E669 Obesity, unspecified: Secondary | ICD-10-CM

## 2015-10-04 DIAGNOSIS — I10 Essential (primary) hypertension: Secondary | ICD-10-CM

## 2015-10-04 LAB — INFLUENZA PANEL BY PCR (TYPE A & B)
H1N1 flu by pcr: NOT DETECTED
INFLAPCR: POSITIVE — AB
INFLBPCR: NEGATIVE

## 2015-10-04 LAB — GLUCOSE, CAPILLARY
Glucose-Capillary: 61 mg/dL — ABNORMAL LOW (ref 65–99)
Glucose-Capillary: 96 mg/dL (ref 65–99)
Glucose-Capillary: 67 mg/dL (ref 65–99)
Glucose-Capillary: 56 mg/dL — ABNORMAL LOW (ref 65–99)
Glucose-Capillary: 123 mg/dL — ABNORMAL HIGH (ref 65–99)

## 2015-10-04 LAB — BASIC METABOLIC PANEL
Anion gap: 8 (ref 5–15)
BUN: 12 mg/dL (ref 6–20)
CHLORIDE: 109 mmol/L (ref 101–111)
CO2: 22 mmol/L (ref 22–32)
CREATININE: 1.06 mg/dL (ref 0.61–1.24)
Calcium: 8.2 mg/dL — ABNORMAL LOW (ref 8.9–10.3)
GFR calc Af Amer: >60 mL/min (ref >60)
GFR calc non Af Amer: >60 mL/min (ref >60)
Glucose, Bld: 77 mg/dL (ref 65–99)
Potassium: 3.9 mmol/L (ref 3.5–5.1)
SODIUM: 139 mmol/L (ref 135–145)

## 2015-10-04 LAB — CBC
HCT: 38.2 % — ABNORMAL LOW (ref 39.0–52.0)
Hemoglobin: 12.1 g/dL — ABNORMAL LOW (ref 13.0–17.0)
MCH: 26.5 pg (ref 26.0–34.0)
MCHC: 31.7 g/dL (ref 30.0–36.0)
MCV: 83.8 fL (ref 78.0–100.0)
PLATELETS: 166 10*3/uL (ref 150–400)
RBC: 4.56 MIL/uL (ref 4.22–5.81)
RDW: 14.1 % (ref 11.5–15.5)
WBC: 6.2 10*3/uL (ref 4.0–10.5)

## 2015-10-04 LAB — EXPECTORATED SPUTUM ASSESSMENT W REFEX TO RESP CULTURE

## 2015-10-04 LAB — EXPECTORATED SPUTUM ASSESSMENT W GRAM STAIN, RFLX TO RESP C

## 2015-10-04 LAB — HIV ANTIBODY (ROUTINE TESTING W REFLEX): HIV Screen 4th Generation wRfx: NONREACTIVE

## 2015-10-04 MED ORDER — LISINOPRIL 20 MG PO TABS
20.0000 mg | ORAL_TABLET | Freq: Every day | ORAL | Status: DC
  Administered 2015-10-04 – 2015-10-05 (×2): 20 mg via ORAL
  Filled 2015-10-04 (×2): qty 1

## 2015-10-04 NOTE — Evaluation (Signed)
Physical Therapy Evaluation Patient Details Name: [name] MRN: [id number] DOB: 12/05/1960 Today's Date: 10/04/2015   History of Present Illness  55 y.o. male with hx of DM, HTN, chronic pain. Admitted with influenza pneumonia.  Clinical Impression  Apparently near baseline, rarely leaves home. Able to ambulate up to 65 feet today using a quad cane, SpO2 ranged from 89-93% with minimal dyspnea. Pt did have difficulty with toileting and hygiene care requiring assist; would benefit from OT evaluation and recommendations. States he still feels weak overall. Will continue to follow and progress until d/c.    Follow Up Recommendations Home health PT;Supervision - Intermittent (May benefit from aide)    Equipment Recommendations  None recommended by PT    Recommendations for Other Services OT consult (for toileting and hygiene difficulties)     Precautions / Restrictions Restrictions Weight Bearing Restrictions: No      Mobility  Bed Mobility               General bed mobility comments: in recliner  Transfers Overall transfer level: Needs assistance Equipment used: Quad cane Transfers: Sit to/from Stand Sit to Stand: Supervision         General transfer comment: supervision for safety. [place] [employer]. Slow to rise but stable once upright with quad cane. Used rails in bathroom. Cues for safety.  Ambulation/Gait Ambulation/Gait assistance: Supervision Ambulation Distance (Feet): 65 Feet Assistive device: Quad cane Gait Pattern/deviations: Step-through pattern;Decreased stride length Gait velocity: decreased Gait velocity interpretation: Below normal speed for age/gender General Gait Details: Guarded with small steps but no overt loss of balance noted. Using quad cane appropriately. VC for larger stride length. SpO2 89-93% on room air throughout with minimal dyspnea.   Stairs            Wheelchair Mobility    Modified Rankin (Stroke Patients Only)        Balance Overall balance assessment: Needs assistance (denies any falls in the past several months) Sitting-balance support: No upper extremity supported;Feet supported Sitting balance-Leahy Scale: Normal     Standing balance support: No upper extremity supported Standing balance-Leahy Scale: Fair                               Pertinent Vitals/Pain Pain Assessment: 0-10 Pain Score:  ("I always have pain, since the past 2 years") Pain Location: "all over, but mainly on the left side" states this started 2 years ago from a "spine injury" Pain Descriptors / Indicators:  ("swelling") Pain Intervention(s): Monitored during session;Repositioned    Home Living Family/patient expects to be discharged to:: Private residence Living Arrangements: Alone Available Help at Discharge: Family;Friend(s);Available PRN/intermittently Type of Home: Apartment Home Access: Level entry     Home Layout: One level Home Equipment: Walker - 2 wheels;Cane - quad;Shower seat      Prior Function Level of Independence: Independent with assistive device(s)         Comments: quad cane for ambulation. Family friends get groceries.     Hand Dominance   Dominant Hand: Right    Extremity/Trunk Assessment   Upper Extremity Assessment: Defer to OT evaluation           Lower Extremity Assessment: Generalized weakness         Communication   Communication: No difficulties  Cognition Arousal/Alertness: Awake/alert Behavior During Therapy: WFL for tasks assessed/performed Overall Cognitive Status: Within Functional Limits for tasks assessed  General Comments General comments (skin integrity, edema, etc.): Pt had a bowel movement in recliner, then again in bathroom. poor hygiene, covering hand with feces. Requires assist to wipe and get cleaned up. States he sometimes has trouble with this at home.    Exercises        Assessment/Plan    PT  Assessment Patient needs continued PT services  PT Diagnosis Abnormality of gait;Generalized weakness   PT Problem List Decreased strength;Decreased activity tolerance;Decreased balance;Decreased mobility;Decreased knowledge of use of DME;Cardiopulmonary status limiting activity;Obesity;Pain;Impaired sensation  PT Treatment Interventions DME instruction;Gait training;Functional mobility training;Therapeutic activities;Therapeutic exercise;Balance training;Patient/family education   PT Goals (Current goals can be found in the Care Plan section) Acute Rehab PT Goals Patient Stated Goal: None stated PT Goal Formulation: With patient Time For Goal Achievement: 10/18/15 Potential to Achieve Goals: Good    Frequency Min 3X/week   Barriers to discharge Decreased caregiver support States only PRN assist, MD note indicates 1.5hr assist daily from caregiver.    Co-evaluation               End of Session Equipment Utilized During Treatment: Gait belt Activity Tolerance: Patient tolerated treatment well Patient left: in chair;with call bell/phone within reach           Time: 1732-1758 PT Time Calculation (min) (ACUTE ONLY): 26 min   Charges:   PT Evaluation $PT Eval Low Complexity: 1 Procedure PT Treatments $Gait Training: 8-22 mins   PT G CodesEllouise Newer 10/04/2015, 6:33 PM  Camille Bal [place], [place]

## 2015-10-04 NOTE — Progress Notes (Signed)
Hypoglycemic Event  CBG: 61  Treatment: 15 GM carbohydrate snack  Symptoms: None  Follow-up CBG: Time:0745 CBG Result:96  Possible Reasons for Event: Inadequate meal intake  Comments/MD notified:eating breakfast    [name] A

## 2015-10-04 NOTE — Progress Notes (Signed)
Patient Demographics:    [name], is a 55 y.o. male, DOB - 06/23/1961, [id number]  Admit date - 10/03/2015   Admitting Physician [name], DO  Outpatient Primary MD for the patient is Minerva Ends, MD  LOS - 1   Chief Complaint  Patient presents with  . Weakness  . Cough        Subjective:    [name] today has, No headache, No chest pain, No abdominal pain - No Nausea, No new weakness tingling or numbness, No Cough - SOB.    Assessment  & Plan :     1.Influenza pneumonia. Much improved on Tamiflu continue, lactate unremarkable, chest x-ray unremarkable, currently afebrile, not hypoxic, increase activity, ambulate, likely discharge in the morning. Patient does not want to go home today.  2. Essential hypertension. We will place him back on his home dose ACE inhibitor.  3. Diabetic peripheral neuropathy. On Neurontin continue.  4. Dyslipidemia. Continue home dose statin.  5. Obesity. Follow with PCP for weight loss.   6. DM type II. On Glucotrol along with sliding scale here. Monitor CBGs.  Lab Results  Component Value Date   HGBA1C 8.0 03/15/2015    CBG (last 3)   Recent Labs  10/04/15 0641 10/04/15 0724 10/04/15 1119  GLUCAP 61* 96 67      Code Status : Flu  Family Communication  : None  Disposition Plan  : Home in am  Consults  :     Procedures  :    DVT Prophylaxis  :  Lovenox    Lab Results  Component Value Date   PLT 166 10/04/2015    Inpatient Medications  Scheduled Meds: . atorvastatin  40 mg Oral Daily  . enoxaparin (LOVENOX) injection  40 mg Subcutaneous Q24H  . gabapentin  300 mg Oral TID  . glipiZIDE  10 mg Oral BID AC  . insulin aspart  0-5 Units Subcutaneous QHS  . insulin aspart  0-9 Units Subcutaneous TID WC  .  oseltamivir  75 mg Oral BID   Continuous Infusions:  PRN Meds:.acetaminophen, ibuprofen  Antibiotics  :     Anti-infectives    Start     Dose/Rate Route Frequency Ordered Stop   10/03/15 1800  oseltamivir (TAMIFLU) capsule 75 mg     75 mg Oral 2 times daily 10/03/15 1659 10/08/15 2159   10/03/15 1432  cefTRIAXone (ROCEPHIN) 2 g in dextrose 5 % 50 mL IVPB  Status:  Discontinued     2 g 100 mL/hr over 30 Minutes Intravenous Every 24 hours 10/03/15 1432 10/04/15 1159   10/03/15 1431  azithromycin (ZITHROMAX) 500 mg in dextrose 5 % 250 mL IVPB  Status:  Discontinued     500 mg 250 mL/hr over 60 Minutes Intravenous Every 24 hours 10/03/15 1431 10/04/15 1159   10/03/15 1430  cefTRIAXone (ROCEPHIN) 1 g in dextrose 5 % 50 mL IVPB  Status:  Discontinued     1 g 100 mL/hr over 30 Minutes Intravenous  Once 10/03/15 1422 10/03/15 1424   10/03/15 1430  azithromycin (ZITHROMAX) 500 mg in dextrose 5 % 250 mL IVPB  Status:  Discontinued     500 mg 250 mL/hr over 60 Minutes Intravenous  Once 10/03/15 1422 10/03/15 1431   10/03/15 1430  cefTRIAXone (ROCEPHIN) 2 g in dextrose 5 % 50 mL IVPB  Status:  Discontinued     2 g 100 mL/hr over 30 Minutes Intravenous Every 24 hours 10/03/15 1424 10/03/15 1432        Objective:   Filed Vitals:   10/04/15 0200 10/04/15 0500 10/04/15 0541 10/04/15 0638  BP:    153/67  Pulse:    83  Temp:    99.3 F (37.4 C)  TempSrc:    Oral  Resp:   18 18  SpO2: 92% 92% 92% 91%    Wt Readings from Last 3 Encounters:  07/07/15 124.739 kg (275 lb)  06/07/15 123.378 kg (272 lb)  05/05/15 120.203 kg (265 lb)     Intake/Output Summary (Last 24 hours) at 10/04/15 1200 Last data filed at 10/04/15 0900  Gross per 24 hour  Intake   1110 ml  Output   1400 ml  Net   -290 ml     Physical Exam  Awake Alert, Oriented X 3, No new F.N deficits, Normal affect [place].AT,PERRAL Supple Neck,No JVD, No cervical lymphadenopathy appriciated.  Symmetrical Chest wall movement,  Good air movement bilaterally, CTAB RRR,No Gallops,Rubs or new Murmurs, No Parasternal Heave +ve B.Sounds, Abd Soft, No tenderness, No organomegaly appriciated, No rebound - guarding or rigidity. No Cyanosis, Clubbing or edema, No new Rash or bruise     Data Review:   Micro Results Recent Results (from the past 240 hour(s))  Blood Culture (routine x 2)     Status: None (Preliminary result)   Collection Time: 10/03/15  3:00 PM  Result Value Ref Range Status   Specimen Description BLOOD LEFT ANTECUBITAL  Final   Special Requests BOTTLES DRAWN AEROBIC AND ANAEROBIC 10CC  Final   Culture NO GROWTH < 24 HOURS  Final   Report Status PENDING  Incomplete  Blood Culture (routine x 2)     Status: None (Preliminary result)   Collection Time: 10/03/15  3:05 PM  Result Value Ref Range Status   Specimen Description BLOOD RIGHT ANTECUBITAL  Final   Special Requests BOTTLES DRAWN AEROBIC ONLY 10CC  Final   Culture NO GROWTH < 24 HOURS  Final   Report Status PENDING  Incomplete  Urine culture     Status: None (Preliminary result)   Collection Time: 10/03/15  8:33 PM  Result Value Ref Range Status   Specimen Description URINE, CLEAN CATCH  Final   Special Requests NONE  Final   Culture NO GROWTH < 12 HOURS  Final   Report Status PENDING  Incomplete  Culture, sputum-assessment     Status: None   Collection Time: 10/03/15 10:54 PM  Result Value Ref Range Status   Specimen Description SPUTUM  Final   Special Requests NONE  Final   Sputum evaluation   Final    MICROSCOPIC FINDINGS SUGGEST THAT THIS SPECIMEN IS NOT REPRESENTATIVE OF LOWER RESPIRATORY SECRETIONS. PLEASE RECOLLECT. RESULT CALLED TO, READ BACK BY AND VERIFIED WITH: R. LIGHT RN [phone] GREEN R    Report Status 10/04/2015 FINAL  Final    Radiology Reports Dg Chest 2 View  10/03/2015  CLINICAL DATA:  Cough, weakness since yesterday. EXAM: CHEST  2 VIEW COMPARISON:  04/26/2015 FINDINGS: Cardiomegaly with vascular congestion.  Linear densities in the left base likely reflect atelectasis. No confluent opacity on the right. No overt edema. No effusions or acute bony abnormality. IMPRESSION: Cardiomegaly with vascular congestion.  Left base  atelectasis. Electronically Signed   By: Rolm Baptise M.D.   On: 10/03/2015 14:46     CBC  Recent Labs Lab 10/03/15 1319 10/04/15 0432  WBC 8.7 6.2  HGB 14.5 12.1*  HCT 45.2 38.2*  PLT 209 166  MCV 83.7 83.8  MCH 26.9 26.5  MCHC 32.1 31.7  RDW 13.8 14.1  LYMPHSABS 1.1  --   MONOABS 0.6  --   EOSABS 0.1  --   BASOSABS 0.0  --     Chemistries   Recent Labs Lab 10/03/15 1319 10/04/15 0432  NA 140 139  K 4.4 3.9  CL 104 109  CO2 25 22  GLUCOSE 144* 77  BUN 12 12  CREATININE 1.16 1.06  CALCIUM 9.4 8.2*  AST 28  --   ALT 17  --   ALKPHOS 55  --   BILITOT 0.7  --    ------------------------------------------------------------------------------------------------------------------ No results for input(s): CHOL, HDL, LDLCALC, TRIG, CHOLHDL, LDLDIRECT in the last 72 hours.  Lab Results  Component Value Date   HGBA1C 8.0 03/15/2015   ------------------------------------------------------------------------------------------------------------------ No results for input(s): TSH, T4TOTAL, T3FREE, THYROIDAB in the last 72 hours.  Invalid input(s): FREET3 ------------------------------------------------------------------------------------------------------------------ No results for input(s): VITAMINB12, FOLATE, FERRITIN, TIBC, IRON, RETICCTPCT in the last 72 hours.  Coagulation profile No results for input(s): INR, PROTIME in the last 168 hours.  No results for input(s): DDIMER in the last 72 hours.  Cardiac Enzymes  Recent Labs Lab 10/03/15 1648  TROPONINI <0.03   ------------------------------------------------------------------------------------------------------------------    Component Value Date/Time   BNP 151.1* 10/03/2015 1649    Time Spent  in minutes  30   [name] K M.D on 10/04/2015 at 12:00 PM  Between 7am to 7pm - Pager - [phone]  After 7pm go to [email or web address] - password [facility]  [facility] -  Office  [phone]

## 2015-10-04 NOTE — Progress Notes (Signed)
Utilization review completed.  

## 2015-10-05 ENCOUNTER — Inpatient Hospital Stay (HOSPITAL_COMMUNITY): Payer: Medicaid Other

## 2015-10-05 ENCOUNTER — Telehealth: Payer: Self-pay

## 2015-10-05 ENCOUNTER — Ambulatory Visit: Payer: Medicaid Other | Admitting: Family Medicine

## 2015-10-05 DIAGNOSIS — R0600 Dyspnea, unspecified: Secondary | ICD-10-CM

## 2015-10-05 LAB — ECHOCARDIOGRAM COMPLETE
AORTIC ROOT 2D: 30 mm
CHL CUP LA SIZE INDEX: 1.59 mm/m2
CHL CUP LA VOL 2D INDEX: 30.7 mL/m2
E/e' ratio: 9.48
EWDT: 165 ms
FS: 30 % (ref 28–44)
IVS/LV PW RATIO, ED: 0.68
LA VOL 2D: 77 mL
LA diam end sys: 40 mm
LASIZE: 40 mm
LAVOL: 73 mL
LAVOLIN: 29.1 mL/m2
LV PW d: 20.3 mm — AB (ref 0.6–1.1)
LV PW s: 20.3 mm
LVIDD: 54 mm — AB (ref 3.5–6.0)
LVIDS: 37.6 mm — AB (ref 2.1–4.0)
MV Dec: 165 ms
MV Peak grad: 2 mmHg
MV pk A vel: 50.8 cm/s
MV pk E vel: 79 cm/s
TDI e' lateral: 8.33 cm/s
TDI e' medial: 8.22 cm/s

## 2015-10-05 LAB — URINE CULTURE: Culture: NO GROWTH

## 2015-10-05 LAB — GLUCOSE, CAPILLARY
GLUCOSE-CAPILLARY: 59 mg/dL — AB (ref 65–99)
GLUCOSE-CAPILLARY: 76 mg/dL (ref 65–99)
GLUCOSE-CAPILLARY: 95 mg/dL (ref 65–99)
Glucose-Capillary: 88 mg/dL (ref 65–99)

## 2015-10-05 MED ORDER — OSELTAMIVIR PHOSPHATE 75 MG PO CAPS: 75.0000 mg | ORAL_CAPSULE | Freq: Two times a day (BID) | ORAL | Status: DC

## 2015-10-05 NOTE — Care Management Note (Signed)
Case Management Note  Patient Details  Name: [name] MRN: [id number] Date of Birth: [birth year]  Subjective/Objective:            Admitted with pneumonia        Action/Plan: PT recommended HHPT, his insurance does not cover HHPT for his diagnosis. Informed patient that his insurance does not cover HHPT but would cover [facility] visits. [facility]. Patient has an aide 1/12 hrs per day . No equipment needs identified.    Expected Discharge Date:                  Expected Discharge Plan:  [place]  In-House Referral:  NA  Discharge planning Services  CM Consult  Post Acute Care Choice:  Home Health Choice offered to:  Patient  DME Arranged:  N/A DME Agency:     HH Arranged:  RN, Nurse's Aide [place] Agency:  [place]  Status of Service:  Completed, signed off  Medicare Important Message Given:    Date Medicare IM Given:    Medicare IM give by:    Date Additional Medicare IM Given:    Additional Medicare Important Message give by:     If discussed at [place] of Stay Meetings, dates discussed:    Additional Comments:  Nila Nephew, RN 10/05/2015, 2:00 PM

## 2015-10-05 NOTE — Progress Notes (Signed)
Inpatient Diabetes Program Recommendations  AACE/ADA: New Consensus Statement on Inpatient Glycemic Control (2015)  Target Ranges:  Prepandial:   less than 140 mg/dL      Peak postprandial:   less than 180 mg/dL (1-2 hours)      Critically ill patients:  140 - 180 mg/dL   Results for [name], [name] (MRN [id number]) as of 10/05/2015 09:27  Ref. Range 10/04/2015 07:24 10/04/2015 11:19 10/04/2015 15:57 10/04/2015 16:36 10/04/2015 22:49 10/05/2015 00:08 10/05/2015 06:34  Glucose-Capillary Latest Ref Range: 65-99 mg/dL 96 67 56 (L) 123 (H) 59 (L) 95 76   Review of Glycemic Control  Diabetes history: DM 2 Outpatient Diabetes medications: Glipizide 10 mg BID, Metformin 1,000 mg BID Current orders for Inpatient glycemic control: Novolog Sensitive + HS scale, Glipizide 10 mg BID  Inpatient Diabetes Program Recommendations: Oral Agents: Patient having several hypoglycemic episodes on glipizide this admission. If patient is not d/c'd, please either d/c or reduce glipizide dose while inpatient.  Thanks,  Tama Headings RN, MSN, [facility] Inpatient Diabetes Coordinator Team Pager [phone] (8a-5p)

## 2015-10-05 NOTE — Progress Notes (Signed)
Physical Therapy Treatment Patient Details Name: [name] MRN: [id number] DOB: [birth year] Today's Date: 10/05/2015    History of Present Illness 55 y.o. male with hx of DM, HTN, chronic pain. Admitted with influenza pneumonia.    PT Comments    Patient is gradually progressing with mobility and activity tolerance. SpO2 95-100% throughout session. Led through [employer] and encouraged pt to perform at home. Current plan remains appropriate.   Follow Up Recommendations  Home health PT;Supervision - Intermittent (May benefit from aide)     Equipment Recommendations  None recommended by PT    Recommendations for Other Services OT consult (for toileting and hygiene difficulties)     Precautions / Restrictions Restrictions Weight Bearing Restrictions: No    Mobility  Bed Mobility               General bed mobility comments: pt OOB in chair upon arrival  Transfers Overall transfer level: Needs assistance Equipment used: Quad cane Transfers: Sit to/from Stand Sit to Stand: Supervision         General transfer comment: supervision for safety; a little unsteady upon standing but no LOB; rocked for momentum  Ambulation/Gait Ambulation/Gait assistance: Min guard Ambulation Distance (Feet): 100 Feet Assistive device: Quad cane Gait Pattern/deviations: Step-through pattern;Decreased stride length;Decreased dorsiflexion - right;Decreased dorsiflexion - left Gait velocity: decreased   General Gait Details: cues for bilat heel strike and increased cadence; pt with safe use of AD; SpO2 95-100% throughout session   Stairs            Wheelchair Mobility    Modified Rankin (Stroke Patients Only)       Balance Overall balance assessment: Needs assistance Sitting-balance support: No upper extremity supported;Feet supported Sitting balance-Leahy Scale: Normal     Standing balance support: Single extremity supported Standing balance-Leahy Scale: Fair                       Cognition Arousal/Alertness: Awake/alert Behavior During Therapy: WFL for tasks assessed/performed Overall Cognitive Status: Within Functional Limits for tasks assessed                      Exercises General Exercises - Lower Extremity Quad Sets: AROM;Both;Seated;10 reps Long Arc Quad: AROM;Both;10 reps;Seated Hip ABduction/ADduction: AROM;AAROM;Both;10 reps;Seated Hip Flexion/Marching: AROM;Both;10 reps;Seated Toe Raises: AROM;Both;10 reps;Seated Heel Raises: AROM;Both;10 reps;Seated    General Comments        Pertinent Vitals/Pain Pain Assessment: Faces Faces Pain Scale: Hurts little more Pain Location: L LE with mobility Pain Descriptors / Indicators: Aching;Heaviness;Sore Pain Intervention(s): Limited activity within patient's tolerance;Monitored during session;Premedicated before session    Home Living                      Prior Function            PT Goals (current goals can now be found in the care plan section) Acute Rehab PT Goals Patient Stated Goal: None stated PT Goal Formulation: With patient Time For Goal Achievement: 10/18/15 Potential to Achieve Goals: Good Progress towards PT goals: Progressing toward goals    Frequency  Min 3X/week    PT Plan Current plan remains appropriate    Co-evaluation             End of Session Equipment Utilized During Treatment: Gait belt Activity Tolerance: Patient tolerated treatment well Patient left: in chair;with call bell/phone within reach;with nursing/sitter in room     Time: [id number] PT Time  Calculation (min) (ACUTE ONLY): 24 min  Charges:  $Gait Training: 8-22 mins $Therapeutic Exercise: 8-22 mins                    G Codes:      [place] April, PTA Pager: [phone]   10/05/2015, 11:38 AM

## 2015-10-05 NOTE — Progress Notes (Signed)
*  PRELIMINARY RESULTS* Echocardiogram 2D Echocardiogram has been performed.  Leavy Cella 10/05/2015, 11:11 AM

## 2015-10-05 NOTE — Discharge Instructions (Signed)
Follow with Primary MD Minerva Ends, MD in 7 days   Get CBC, CMP, 2 view Chest X ray checked  by Primary MD next visit.    Activity: As tolerated with Full fall precautions use walker/cane & assistance as needed   Disposition Home     Diet:   Heart Healthy Low Carb.  Accuchecks 4 times/day, Once in AM empty stomach and then before each meal. Log in all results and show them to your Prim.MD in 3 days. If any glucose reading is under 80 or above 300 call your Prim MD immidiately. Follow Low glucose instructions for glucose under 80 as instructed.  For Heart failure patients - Check your Weight same time everyday, if you gain over 2 pounds, or you develop in leg swelling, experience more shortness of breath or chest pain, call your Primary MD immediately. Follow Cardiac Low Salt Diet and 1.5 lit/day fluid restriction.   On your next visit with your primary care physician please Get Medicines reviewed and adjusted.   Please request your Prim.MD to go over all Hospital Tests and Procedure/Radiological results at the follow up, please get all Hospital records sent to your Prim MD by signing hospital release before you go home.   If you experience worsening of your admission symptoms, develop shortness of breath, life threatening emergency, suicidal or homicidal thoughts you must seek medical attention immediately by calling 911 or calling your MD immediately  if symptoms less severe.  You Must read complete instructions/literature along with all the possible adverse reactions/side effects for all the Medicines you take and that have been prescribed to you. Take any new Medicines after you have completely understood and accpet all the possible adverse reactions/side effects.   Do not drive, operating heavy machinery, perform activities at heights, swimming or participation in water activities or provide baby sitting services if your were admitted for syncope or siezures until you have  seen by Primary MD or a Neurologist and advised to do so again.  Do not drive when taking Pain medications.    Do not take more than prescribed Pain, Sleep and Anxiety Medications  Special Instructions: If you have smoked or chewed Tobacco  in the last 2 yrs please stop smoking, stop any regular Alcohol  and or any Recreational drug use.  Wear Seat belts while driving.   Please note  You were cared for by a hospitalist during your hospital stay. If you have any questions about your discharge medications or the care you received while you were in the hospital after you are discharged, you can call the unit and asked to speak with the hospitalist on call if the hospitalist that took care of you is not available. Once you are discharged, your primary care physician will handle any further medical issues. Please note that NO REFILLS for any discharge medications will be authorized once you are discharged, as it is imperative that you return to your primary care physician (or establish a relationship with a primary care physician if you do not have one) for your aftercare needs so that they can reassess your need for medications and monitor your lab values.

## 2015-10-05 NOTE — Discharge Summary (Signed)
[name], is a 55 y.o. male  DOB 1961/04/18  MRN [id number].  Admission date:  10/03/2015  Admitting Physician  [name], DO  Discharge Date:  10/05/2015   Primary [name]  [name]  Recommendations for primary care physician for things to follow:   Monitor glycemic control in the outpatient setting, repeat CXR, CBC, BMP next visit   Admission Diagnosis  Community acquired pneumonia [J18.9]   Discharge Diagnosis  Community acquired pneumonia [J18.9]     Active Problems:   Diabetes mellitus, type 2 ([place])   OBESITY NOS   Essential hypertension   Pneumonia      Past Medical History  Diagnosis Date  . Diabetes mellitus   . Hypertension   . 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 Cocaine abuse   . Chronic pain   . Alcohol abuse   . Tobacco abuse   . Gait instability   . Chronic arm pain   . Chronic leg pain   . Peripheral edema     chronic LUE    Past Surgical History  Procedure Laterality Date  . Left arm skin graft    . Left heart catheterization with coronary angiogram Bilateral 12/26/2013    Procedure: LEFT HEART CATHETERIZATION WITH CORONARY ANGIOGRAM;  Surgeon: Burnell Blanks, [name];  Location: [facility] CATH LAB;  Service: Cardiovascular;  Laterality: Bilateral;  . Cardiac catheterization  12/27/2013  . Cardiac catheterization  12/27/2013    Impression: 1. 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 Moderate disease in the small caliber intermediate branch, 3. Normal LV systolic function       HPI  from the history and physical done on the day of admission:    [name] is a 55 y.o. male With PMHx of DM, HTN, chronic pain who lives at home. He has a caregiver at home for 1 1/2 hours/day. Recent sick contact was a caregiver with URI type symptoms. He  felt ok yesterday, but last night developed cough with white colored sputum. Fever of 102. No nausea, no vomiting, no diarrhea.   In the ER, chest x ray showed left base atelectasis and ER doc was suspicious for a PNA. Flu swab was ordered for rule out. EKG had some t wave changes as well so BNP, echo, CE were ordered. Patient denies chest pain nor recent cocaine use.     Hospital Course:     1.Influenza pneumonia. Much improved on Tamiflu continue, lactate unremarkable, chest x-ray unremarkable, currently afebrile, not hypoxic, increase activity, ambulate, He appears to be close to his baseline without any distress, no oxygen requirement, will be discharged on oral Tamiflu.  2. Essential hypertension. We will place him back on his home dose ACE inhibitor. Avoid beta blocker due to ongoing cocaine abuse.  3. Diabetic peripheral neuropathy. On Neurontin continue.  4. Dyslipidemia. Continue home dose statin.  5. Obesity. Follow with PCP for weight loss.   6. Ongoing cocaine abuse. Counseled to quit.  Positive for cocaine this admission.  7. DM type II. Her kidney home regimen follow with PCP for glycemic control.    Discharge Condition: Stable  Follow UP  Follow-up Information    Follow up with [name]. Schedule an appointment as soon as possible for a visit in 3 days.   Specialty:  Family Medicine   Contact information:   [place] [address] [phone]        Consults obtained - None  Diet and Activity recommendation: See Discharge Instructions below  Discharge Instructions       Discharge Instructions    Discharge instructions    Complete by:  As directed   Follow with Primary [name] [name] in 7 days   Get CBC, CMP, 2 view Chest X ray checked  by Primary [name] next visit.    Activity: As tolerated with Full fall precautions use walker/cane & assistance as needed   Disposition Home     Diet:   Heart Healthy  Low Carb.  Accuchecks 4 times/day, Once in AM empty stomach and then before each meal. Log in all results and show them to your Prim.[name] in 3 days. If any glucose reading is under 80 or above 300 call your Prim [name] immidiately. Follow Low glucose instructions for glucose under 80 as instructed.  For Heart failure patients - Check your Weight same time everyday, if you gain over 2 pounds, or you develop in leg swelling, experience more shortness of breath or chest pain, call your Primary [name] immediately. Follow Cardiac Low Salt Diet and 1.5 lit/day fluid restriction.   On your next visit with your primary care physician please Get Medicines reviewed and adjusted.   Please request your Prim.[name] to go over all Hospital Tests and Procedure/Radiological results at the follow up, please get all Hospital records sent to your Prim [name] by signing hospital release before you go home.   If you experience worsening of your admission symptoms, develop shortness of breath, life threatening emergency, suicidal or homicidal thoughts you must seek medical attention immediately by calling 911 or calling your [name] immediately  if symptoms less severe.  You Must read complete instructions/literature along with all the possible adverse reactions/side effects for all the Medicines you take and that have been prescribed to you. Take any new Medicines after you have completely understood and accpet all the possible adverse reactions/side effects.   Do not drive, operating heavy machinery, perform activities at heights, swimming or participation in water activities or provide baby sitting services if your were admitted for syncope or siezures until you have seen by Primary [name] or a Neurologist and advised to do so again.  Do not drive when taking Pain medications.    Do not take more than prescribed Pain, Sleep and Anxiety Medications  Special Instructions: If you have smoked or chewed Tobacco  in the last 2 yrs please  stop smoking, stop any regular Alcohol  and or any Recreational drug use.  Wear Seat belts while driving.   Please note  You were cared for by a hospitalist during your hospital stay. If you have any questions about your discharge medications or the care you received while you were in the hospital after you are discharged, you can call the unit and asked to speak with the hospitalist on call if the hospitalist that took care of you is not available. Once you are discharged, your primary care physician will handle any  further medical issues. Please note that NO REFILLS for any discharge medications will be authorized once you are discharged, as it is imperative that you return to your primary care physician (or establish a relationship with a primary care physician if you do not have one) for your aftercare needs so that they can reassess your need for medications and monitor your lab values.     Increase activity slowly    Complete by:  As directed              Discharge Medications       Medication List    STOP taking these medications        simvastatin 20 MG tablet  Commonly known as:  ZOCOR      TAKE these medications        atorvastatin 40 MG tablet  Commonly known as:  LIPITOR  Take 1 tablet (40 mg total) by mouth daily. Must have office visit     gabapentin 300 MG capsule  Commonly known as:  NEURONTIN  Take 1 capsule (300 mg total) by mouth 3 (three) times daily. Needs office visit for refills     glipiZIDE 10 MG tablet  Commonly known as:  GLUCOTROL  TAKE 1 TABLET (10 MG TOTAL) BY MOUTH TWO   (TWO) TIMES DAILY BEFORE A MEAL.     glucose monitoring kit monitoring kit  1 each by Does not apply route as needed for other. Dispense any model that is covered- dispense testing supplies for Q AC/ HS accuchecks- 1 month supply with one refil.     lisinopril 20 MG tablet  Commonly known as:  PRINIVIL,ZESTRIL  Take 1 tablet (20 mg total) by mouth daily. Must have office  visit     metFORMIN 1000 MG tablet  Commonly known as:  GLUCOPHAGE  Take 1 tablet (1,000 mg total) by mouth 2 (two) times daily with a meal. Resume on 04/29/15.     oseltamivir 75 MG capsule  Commonly known as:  TAMIFLU  Take 1 capsule (75 mg total) by mouth 2 (two) times daily.        Major procedures and Radiology Reports - PLEASE review detailed and final reports for all details, in brief -       Dg Chest 2 View  10/03/2015  CLINICAL DATA:  Cough, weakness since yesterday. EXAM: CHEST  2 VIEW COMPARISON:  04/26/2015 FINDINGS: Cardiomegaly with vascular congestion. Linear densities in the left base likely reflect atelectasis. No confluent opacity on the right. No overt edema. No effusions or acute bony abnormality. IMPRESSION: Cardiomegaly with vascular congestion.  Left base atelectasis. Electronically Signed   By: Rolm Baptise M.D.   On: 10/03/2015 14:46    Micro Results      Recent Results (from the past 240 hour(s))  Blood Culture (routine x 2)     Status: None (Preliminary result)   Collection Time: 10/03/15  3:00 PM  Result Value Ref Range Status   Specimen Description BLOOD LEFT ANTECUBITAL  Final   Special Requests BOTTLES DRAWN AEROBIC AND ANAEROBIC 10CC  Final   Culture NO GROWTH < 24 HOURS  Final   Report Status PENDING  Incomplete  Blood Culture (routine x 2)     Status: None (Preliminary result)   Collection Time: 10/03/15  3:05 PM  Result Value Ref Range Status   Specimen Description BLOOD RIGHT ANTECUBITAL  Final   Special Requests BOTTLES DRAWN AEROBIC ONLY 10CC  Final   Culture NO GROWTH <  24 HOURS  Final   Report Status PENDING  Incomplete  Urine culture     Status: None (Preliminary result)   Collection Time: 10/03/15  8:33 PM  Result Value Ref Range Status   Specimen Description URINE, CLEAN CATCH  Final   Special Requests NONE  Final   Culture NO GROWTH < 24 HOURS  Final   Report Status PENDING  Incomplete  Culture, sputum-assessment     Status:  None   Collection Time: 10/03/15 10:54 PM  Result Value Ref Range Status   Specimen Description SPUTUM  Final   Special Requests NONE  Final   Sputum evaluation   Final    MICROSCOPIC FINDINGS SUGGEST THAT THIS SPECIMEN IS NOT REPRESENTATIVE OF LOWER RESPIRATORY SECRETIONS. PLEASE RECOLLECT. RESULT CALLED TO, READ BACK BY AND VERIFIED WITH: R. LIGHT RN [phone] GREEN R    Report Status 10/04/2015 FINAL  Final       Today   Subjective    Issai Rattigan today has no headache,no chest abdominal pain,no new weakness tingling or numbness, feels much better wants to go home today.     Objective   Blood pressure 151/70, pulse 73, temperature 97.9 F (36.6 C), temperature source Oral, resp. rate 18, SpO2 94 %.   Intake/Output Summary (Last 24 hours) at 10/05/15 1126 Last data filed at 10/05/15 0700  Gross per 24 hour  Intake      0 ml  Output   1500 ml  Net  -1500 ml    Exam Awake Alert, Oriented x 3, No new F.N deficits, Normal affect [place].AT,PERRAL Supple Neck,No JVD, No cervical lymphadenopathy appriciated.  Symmetrical Chest wall movement, Good air movement bilaterally, CTAB RRR,No Gallops,Rubs or new Murmurs, No Parasternal Heave +ve B.Sounds, Abd Soft, Non tender, No organomegaly appriciated, No rebound -guarding or rigidity. No Cyanosis, Clubbing or edema, No new Rash or bruise   Data Review   CBC w Diff: Lab Results  Component Value Date   WBC 6.2 10/04/2015   HGB 12.1* 10/04/2015   HCT 38.2* 10/04/2015   PLT 166 10/04/2015   LYMPHOPCT 12 10/03/2015   MONOPCT 7 10/03/2015   EOSPCT 1 10/03/2015   BASOPCT 0 10/03/2015    CMP: Lab Results  Component Value Date   NA 139 10/04/2015   K 3.9 10/04/2015   CL 109 10/04/2015   CO2 22 10/04/2015   BUN 12 10/04/2015   CREATININE 1.06 10/04/2015   CREATININE 0.89 06/24/2015   PROT 8.3* 10/03/2015   ALBUMIN 4.2 10/03/2015   BILITOT 0.7 10/03/2015   ALKPHOS 55 10/03/2015   AST 28 10/03/2015   ALT 17  10/03/2015  .   Total Time in preparing paper work, data evaluation and todays exam - 35 minutes  Thurnell Lose M.D on 10/05/2015 at 11:26 AM  [facility]   Office  [phone]

## 2015-10-05 NOTE — Progress Notes (Signed)
Hypoglycemic Event  CBG: 56  Treatment: 15 GM carbohydrate snack  Symptoms: None  Follow-up CBG: Time: 0008 CBG Result: 95  Possible Reasons for Event: Unknown  Comments/MD notified: no     Zeven Kocak, Dene Gentry

## 2015-10-05 NOTE — Telephone Encounter (Signed)
This CM met with the patient at the hospital prior to his discharge.  He currently receives his primary care at [facility] - PCP - Dr Funches and he would like to follow up at the [facility] after discharge. The [facility] was closed at the time this CM met with the patient and the patient was agreeable to having this CM call him with the appointment time.  His said that the best # to reach him is # [phone]. He noted that his sister - Ladonna Alemany # [phone] is an emergency contact.   He noted that he uses [facility] for transportation to his medical appointments. He stated that he currently receives PCS services 1.5 hrs/day x 7 days through [facility] on [address].  His pharmacy is [facility] and he has his medications delivered in bubble packs. He reported no problems affording his medications.  He noted that he has a quad cane and walker at home in addition to a glucometer. He said that he is not able to use the glucometer because he has "problems" with his left hand.   Update provided to Mary Kreig, RN CM.  She noted that a referral for HH RN was going to be made. The patient does not qualify for home health P.T.  An appointment was made for the patient at [facility] for 10/12/15 @ 1700. A call was placed to the patient #[phone]  to inform him of the appointment and a HIPAA compliant voice mail message was left requesting a call back to # [phone] or [phone].    This CM then spoke to Kim Lanier, [facility] Liaison regarding the patient. She noted that she met with the patient earlier today and they will follow up and contact him at home    

## 2015-10-06 ENCOUNTER — Telehealth: Payer: Self-pay

## 2015-10-06 NOTE — Telephone Encounter (Signed)
This Case Manager placed call to patient to inform him of hospital follow-up appointment on 10/12/15 at 1700 with Dr. Janne Napoleon. Also wanted to inquire if he had been notified when his home health services would begin. Call placed to [phone]; unable to reach patient. HIPPA compliant voicemail left requesting return call.

## 2015-10-07 ENCOUNTER — Telehealth: Payer: Self-pay

## 2015-10-07 NOTE — Telephone Encounter (Signed)
This Case Manager placed an additional call to patient to inform him of hospital follow-up appointment on 10/12/15 at 1700 with Dr. Janne Napoleon.  Also wanted to inquire if patient's home health services had begun.  Call placed to [phone]; unable to reach patient. HIPPA compliant voicemail left requesting return call.

## 2015-10-07 NOTE — Telephone Encounter (Signed)
This Case Manager received return call from patient. Informed patient he has a hospital follow-up appointment on 10/12/15 at 1700 with Dr. Janne Napoleon. Also inquired if patient's [facility] RN/HHA services have started with [facility]. Patient indicated his [facility] services have not started. Gave patient [facility]'s contact information and instructed patient to call and determine when start of care would be. Patient verbalized understanding and appreciative of call. No additional needs/concerns identified.

## 2015-10-08 LAB — CULTURE, BLOOD (ROUTINE X 2)
CULTURE: NO GROWTH
Culture: NO GROWTH

## 2015-10-12 ENCOUNTER — Encounter: Payer: Self-pay | Admitting: Internal Medicine

## 2015-10-12 ENCOUNTER — Ambulatory Visit: Payer: Medicaid Other | Attending: Internal Medicine | Admitting: Internal Medicine

## 2015-10-12 VITALS — BP 159/85 | HR 61 | Temp 97.9°F | Resp 15 | Ht 75.0 in | Wt 277.4 lb

## 2015-10-12 DIAGNOSIS — I10 Essential (primary) hypertension: Secondary | ICD-10-CM | POA: Diagnosis not present

## 2015-10-12 DIAGNOSIS — J101 Influenza due to other identified influenza virus with other respiratory manifestations: Secondary | ICD-10-CM | POA: Diagnosis not present

## 2015-10-12 DIAGNOSIS — E669 Obesity, unspecified: Secondary | ICD-10-CM | POA: Diagnosis not present

## 2015-10-12 DIAGNOSIS — Z79899 Other long term (current) drug therapy: Secondary | ICD-10-CM | POA: Insufficient documentation

## 2015-10-12 DIAGNOSIS — J189 Pneumonia, unspecified organism: Secondary | ICD-10-CM

## 2015-10-12 DIAGNOSIS — E119 Type 2 diabetes mellitus without complications: Secondary | ICD-10-CM | POA: Diagnosis not present

## 2015-10-12 DIAGNOSIS — F17210 Nicotine dependence, cigarettes, uncomplicated: Secondary | ICD-10-CM | POA: Diagnosis not present

## 2015-10-12 DIAGNOSIS — Z7984 Long term (current) use of oral hypoglycemic drugs: Secondary | ICD-10-CM | POA: Insufficient documentation

## 2015-10-12 DIAGNOSIS — Z716 Tobacco abuse counseling: Secondary | ICD-10-CM

## 2015-10-12 LAB — POCT GLYCOSYLATED HEMOGLOBIN (HGB A1C): Hemoglobin A1C: 7.7

## 2015-10-12 LAB — GLUCOSE, POCT (MANUAL RESULT ENTRY): POC Glucose: 149 mg/dl — AB (ref 70–99)

## 2015-10-12 MED ORDER — AMLODIPINE BESYLATE 5 MG PO TABS: 5.0000 mg | ORAL_TABLET | Freq: Every day | ORAL | Status: DC

## 2015-10-12 MED ORDER — GUAIFENESIN ER 600 MG PO TB12: 600.0000 mg | ORAL_TABLET | Freq: Two times a day (BID) | ORAL | Status: DC | PRN

## 2015-10-12 NOTE — Progress Notes (Signed)
[name], is a 55 y.o. male  VVO:[id number]  YIR:[id number]  DOB - 1960/10/13  CC:  Chief Complaint  Patient presents with  . Hospitalization Follow-up       HPI: [name] is a 55 y.o. male here today to establish medical care and followup.  He was admitted to hospital 3/12- 14 for Influenza A pneumonia. Since than, feels better, but still coughing at times, clear phlegm production. He also still smokes, but down to 2-3cigs/day. Cough doesn't wake him up at night.  Patient has No headache, No chest pain, No abdominal pain - No Nausea, No new weakness tingling or numbness, No Cough - SOB.  No Known Allergies Past Medical History  Diagnosis Date  . Diabetes mellitus   . Hypertension   . 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 Cocaine abuse   . Chronic pain   . Alcohol abuse   . Tobacco abuse   . Gait instability   . Chronic arm pain   . Chronic leg pain   . Peripheral edema     chronic LUE   Current Outpatient Prescriptions on File Prior to Visit  Medication Sig Dispense Refill  . atorvastatin (LIPITOR) 40 MG tablet Take 1 tablet (40 mg total) by mouth daily. Must have office visit 30 tablet 0  . gabapentin (NEURONTIN) 300 MG capsule Take 1 capsule (300 mg total) by mouth 3 (three) times daily. Needs office visit for refills 90 capsule 0  . glipiZIDE (GLUCOTROL) 10 MG tablet TAKE 1 TABLET (10 MG TOTAL) BY MOUTH TWO   (TWO) TIMES DAILY BEFORE A MEAL. 60 tablet 3  . glucose monitoring kit (FREESTYLE) monitoring kit 1 each by Does not apply route as needed for other. Dispense any model that is covered- dispense testing supplies for Q AC/ HS accuchecks- 1 month supply with one refil. 1 each 1  . lisinopril (PRINIVIL,ZESTRIL) 20 MG tablet Take 1 tablet (20 mg total) by mouth daily. Must have office visit 30 tablet 0  . metFORMIN (GLUCOPHAGE) 1000 MG tablet Take 1 tablet (1,000 mg total) by mouth 2 (two) times daily with a meal. Resume on  04/29/15.    Marland Kitchen oseltamivir (TAMIFLU) 75 MG capsule Take 1 capsule (75 mg total) by mouth 2 (two) times daily. (Patient not taking: Reported on 10/12/2015) 6 capsule 0   No current facility-administered medications on file prior to visit.   Family History  Problem Relation Age of Onset  . Hypertension Mother   . Diabetes Mother   . Cancer Mother     breast cancer   . Hypertension Father   . Heart disease Father   . Stroke Father   . Hypertension Sister    Social History   Social History  . Marital Status: Divorced    Spouse Name: N/A  . Number of Children: 0  . Years of Education: 10   Occupational History  . Unemployed    Social History Main Topics  . Smoking status: Current Every Day Smoker -- 0.25 packs/day    Types: Cigarettes  . Smokeless tobacco: Never Used  . Alcohol Use: 0.0 oz/week    0 Standard drinks or equivalent per week     Comment: occassional  . Drug Use: No     Comment: last use June 2015  . Sexual Activity: Not on file   Other Topics Concern  . Not on file   Social History Narrative   Lives at home  alone.   Right-handed.   Occasional use.    Review of Systems: Constitutional: Negative for fever, chills, diaphoresis, activity change, appetite change and fatigue. HENT: Negative for ear pain, nosebleeds, congestion, facial swelling, rhinorrhea, neck pain, neck stiffness and ear discharge.  Eyes: Negative for pain, discharge, redness, itching and visual disturbance. Respiratory: Negative for chest tightness, shortness of breath, wheezing and stridor.   +coughing so hard some times feels like choking, but quickly goes away. Productive of clear phelgm. Cardiovascular: Negative for chest pain, palpitations and leg swelling. Gastrointestinal: Negative for abdominal distention. Genitourinary: Negative for dysuria, urgency, frequency, hematuria, flank pain, decreased urine volume, difficulty urinating and dyspareunia.  Musculoskeletal: Negative for back  pain, joint swelling, arthralgia and gait problem. Neurological: Negative for dizziness, tremors, seizures, syncope, facial asymmetry, speech difficulty, weakness, light-headedness, numbness and headaches.  Hematological: Negative for adenopathy. Does not bruise/bleed easily. Psychiatric/Behavioral: Negative for hallucinations, behavioral problems, confusion, dysphoric mood, decreased concentration and agitation.    Objective:   Filed Vitals:   10/12/15 1727  BP: 159/85  Pulse: 61  Temp: 97.9 F (36.6 C)  Resp: 15    Physical Exam: Constitutional: Patient appears well-developed and well-nourished. No distress. HENT: Normocephalic, atraumatic, External right and left ear normal. Oropharynx is clear and moist.  Eyes: Conjunctivae and EOM are normal. PERRL, no scleral icterus. Neck: Normal ROM. Neck supple. No JVD. No tracheal deviation. No thyromegaly. CVS: RRR, S1/S2 +, no murmurs, no gallops, no carotid bruit.  Pulmonary: Effort and breath sounds normal, no stridor, rhonchi, wheezes, rales.  Abdominal: Soft. BS +, no distension, tenderness, rebound or guarding.  Musculoskeletal: Normal range of motion. No edema and no tenderness.  Lymphadenopathy: No lymphadenopathy noted, cervical, inguinal or axillary Neuro: Alert. Normal reflexes, muscle tone coordination. No cranial nerve deficit. Skin: Skin is warm and dry. No rash noted. Not diaphoretic. No erythema. No pallor. Psychiatric: Normal mood and affect. Behavior, judgment, thought content normal.  Lab Results  Component Value Date   WBC 6.2 10/04/2015   HGB 12.1* 10/04/2015   HCT 38.2* 10/04/2015   MCV 83.8 10/04/2015   PLT 166 10/04/2015   Lab Results  Component Value Date   CREATININE 1.06 10/04/2015   BUN 12 10/04/2015   NA 139 10/04/2015   K 3.9 10/04/2015   CL 109 10/04/2015   CO2 22 10/04/2015    Lab Results  Component Value Date   HGBA1C 7.7 10/12/2015   Lipid Panel     Component Value Date/Time   CHOL  222* 03/15/2015 1501   TRIG 85 03/15/2015 1501   HDL 55 03/15/2015 1501   CHOLHDL 4.0 03/15/2015 1501   VLDL 17 03/15/2015 1501   LDLCALC 150* 03/15/2015 1501       Assessment and plan:   1.  Recent Influenza A pna - doing better, still w/ some bronchospasms/cough, but lungs are clear - recd mucinex prn for expectorant, avoid dm/antitussives b/c of bp.   2 Type 2 diabetes mellitus without complication, without long-term current use of insulin (HCC) - stable, same regimen for now - Glucose (CBG) 149 - HgB A1c 7.7  3. Essential hypertension - continue lisinopril, added norvasc 22m today.  4. OBESITY NOS - encouragd DASH diet/low salt/low carb diet, exercise!  5. Tobacco abuse counseling - encouraged to stop, since he's only smoking 2-3 cigs a day now, seems more of a habit than anything now.   Return in about 3 months (around 01/12/2016). /Esther Hardy fu w/ Dr FAdrian Blackwaterat next schedule appt.  The patient was given clear instructions to go to ER or return to medical center if symptoms don't improve, worsen or new problems develop. The patient verbalized understanding. The patient was told to call to get lab results if they haven't heard anything in the next week.    Maren Reamer, MD, [place], [place]   10/12/2015, 6:12 PM

## 2015-10-12 NOTE — Progress Notes (Signed)
Hospital follow up for PNA and flu Reports productive cough with clear phlegm and lethargy still Smoking less than half a pack a day

## 2015-10-12 NOTE — Patient Instructions (Signed)
- fu Dr Adrian Blackwater with your next appt - pick up meds  It was pleasure meeting you. DASH Eating Plan DASH stands for "Dietary Approaches to Stop Hypertension." The DASH eating plan is a healthy eating plan that has been shown to reduce high blood pressure (hypertension). Additional health benefits may include reducing the risk of type 2 diabetes mellitus, heart disease, and stroke. The DASH eating plan may also help with weight loss. WHAT DO I NEED TO KNOW ABOUT THE DASH EATING PLAN? For the DASH eating plan, you will follow these general guidelines:  Choose foods with a percent daily value for sodium of less than 5% (as listed on the food label).  Use salt-free seasonings or herbs instead of table salt or sea salt.  Check with your health care provider or pharmacist before using salt substitutes.  Eat lower-sodium products, often labeled as "lower sodium" or "no salt added."  Eat fresh foods.  Eat more vegetables, fruits, and low-fat dairy products.  Choose whole grains. Look for the word "whole" as the first word in the ingredient list.  Choose fish and skinless chicken or [place] more often than red meat. Limit fish, poultry, and meat to 6 oz (170 g) each day.  Limit sweets, desserts, sugars, and sugary drinks.  Choose heart-healthy fats.  Limit cheese to 1 oz (28 g) per day.  Eat more home-cooked food and less restaurant, buffet, and fast food.  Limit fried foods.  Cook foods using methods other than frying.  Limit canned vegetables. If you do use them, rinse them well to decrease the sodium.  When eating at a restaurant, ask that your food be prepared with less salt, or no salt if possible. WHAT FOODS CAN I EAT? Seek help from a dietitian for individual calorie needs. Grains Whole grain or whole wheat bread. Brown rice. Whole grain or whole wheat pasta. Quinoa, bulgur, and whole grain cereals. Low-sodium cereals. Corn or whole wheat flour tortillas. Whole grain cornbread.  Whole grain crackers. Low-sodium crackers. Vegetables Fresh or frozen vegetables (raw, steamed, roasted, or grilled). Low-sodium or reduced-sodium tomato and vegetable juices. Low-sodium or reduced-sodium tomato sauce and paste. Low-sodium or reduced-sodium canned vegetables.  Fruits All fresh, canned (in natural juice), or frozen fruits. Meat and Other Protein Products Ground beef (85% or leaner), grass-fed beef, or beef trimmed of fat. Skinless chicken or [place]. Ground chicken or [place]. Pork trimmed of fat. All fish and seafood. Eggs. Dried beans, peas, or lentils. Unsalted nuts and seeds. Unsalted canned beans. Dairy Low-fat dairy products, such as skim or 1% milk, 2% or reduced-fat cheeses, low-fat ricotta or cottage cheese, or plain low-fat yogurt. Low-sodium or reduced-sodium cheeses. Fats and Oils Tub margarines without trans fats. Light or reduced-fat mayonnaise and salad dressings (reduced sodium). Avocado. Safflower, olive, or canola oils. Natural peanut or almond butter. Other Unsalted popcorn and pretzels. The items listed above may not be a complete list of recommended foods or beverages. Contact your dietitian for more options. WHAT FOODS ARE NOT RECOMMENDED? Grains White bread. White pasta. White rice. Refined cornbread. Bagels and croissants. Crackers that contain trans fat. Vegetables Creamed or fried vegetables. Vegetables in a cheese sauce. Regular canned vegetables. Regular canned tomato sauce and paste. Regular tomato and vegetable juices. Fruits Dried fruits. Canned fruit in light or heavy syrup. Fruit juice. Meat and Other Protein Products Fatty cuts of meat. Ribs, chicken wings, bacon, sausage, bologna, salami, chitterlings, fatback, hot dogs, bratwurst, and packaged luncheon meats. Salted nuts and seeds. Canned  beans with salt. Dairy Whole or 2% milk, cream, half-and-half, and cream cheese. Whole-fat or sweetened yogurt. Full-fat cheeses or blue cheese. Nondairy  creamers and whipped toppings. Processed cheese, cheese spreads, or cheese curds. Condiments Onion and garlic salt, seasoned salt, table salt, and sea salt. Canned and packaged gravies. Worcestershire sauce. Tartar sauce. Barbecue sauce. Teriyaki sauce. Soy sauce, including reduced sodium. Steak sauce. Fish sauce. Oyster sauce. Cocktail sauce. Horseradish. Ketchup and mustard. Meat flavorings and tenderizers. Bouillon cubes. Hot sauce. Tabasco sauce. Marinades. Taco seasonings. Relishes. Fats and Oils Butter, stick margarine, lard, shortening, ghee, and bacon fat. Coconut, palm kernel, or palm oils. Regular salad dressings. Other Pickles and olives. Salted popcorn and pretzels. The items listed above may not be a complete list of foods and beverages to avoid. Contact your dietitian for more information. WHERE CAN I FIND MORE INFORMATION? National Heart, Lung, and Blood Institute: travelstabloid.com   This information is not intended to replace advice given to you by your health care provider. Make sure you discuss any questions you have with your health care provider.   Document Released: 06/29/2011 Document Revised: 07/31/2014 Document Reviewed: 05/14/2013 Elsevier Interactive Patient Education 2016 [employer].   Smoking Cessation, Tips for Success If you are ready to quit smoking, congratulations! You have chosen to help yourself be healthier. Cigarettes bring nicotine, tar, carbon monoxide, and other irritants into your body. Your lungs, heart, and blood vessels will be able to work better without these poisons. There are many different ways to quit smoking. Nicotine gum, nicotine patches, a nicotine inhaler, or nicotine nasal spray can help with physical craving. Hypnosis, support groups, and medicines help break the habit of smoking. WHAT THINGS CAN I DO TO MAKE QUITTING EASIER?  Here are some tips to help you quit for good:  Pick a date when you will  quit smoking completely. Tell all of your friends and family about your plan to quit on that date.  Do not try to slowly cut down on the number of cigarettes you are smoking. Pick a quit date and quit smoking completely starting on that day.  Throw away all cigarettes.   Clean and remove all ashtrays from your home, work, and car.  On a card, write down your reasons for quitting. Carry the card with you and read it when you get the urge to smoke.  Cleanse your body of nicotine. Drink enough water and fluids to keep your urine clear or pale yellow. Do this after quitting to flush the nicotine from your body.  Learn to predict your moods. Do not let a bad situation be your excuse to have a cigarette. Some situations in your life might tempt you into wanting a cigarette.  Never have "just one" cigarette. It leads to wanting another and another. Remind yourself of your decision to quit.  Change habits associated with smoking. If you smoked while driving or when feeling stressed, try other activities to replace smoking. Stand up when drinking your coffee. Brush your teeth after eating. Sit in a different chair when you read the paper. Avoid alcohol while trying to quit, and try to drink fewer caffeinated beverages. Alcohol and caffeine may urge you to smoke.  Avoid foods and drinks that can trigger a desire to smoke, such as sugary or spicy foods and alcohol.  Ask people who smoke not to smoke around you.  Have something planned to do right after eating or having a cup of coffee. For example, plan to take a  walk or exercise.  Try a relaxation exercise to calm you down and decrease your stress. Remember, you may be tense and nervous for the first 2 weeks after you quit, but this will pass.  Find new activities to keep your hands busy. Play with a pen, coin, or rubber band. Doodle or draw things on paper.  Brush your teeth right after eating. This will help cut down on the craving for the taste  of tobacco after meals. You can also try mouthwash.   Use oral substitutes in place of cigarettes. Try using lemon drops, carrots, cinnamon sticks, or chewing gum. Keep them handy so they are available when you have the urge to smoke.  When you have the urge to smoke, try deep breathing.  Designate your home as a nonsmoking area.  If you are a heavy smoker, ask your health care provider about a prescription for nicotine chewing gum. It can ease your withdrawal from nicotine.  Reward yourself. Set aside the cigarette money you save and buy yourself something nice.  Look for support from others. Join a support group or smoking cessation program. Ask someone at home or at work to help you with your plan to quit smoking.  Always ask yourself, "Do I need this cigarette or is this just a reflex?" Tell yourself, "Today, I choose not to smoke," or "I do not want to smoke." You are reminding yourself of your decision to quit.  Do not replace cigarette smoking with electronic cigarettes (commonly called e-cigarettes). The safety of e-cigarettes is unknown, and some may contain harmful chemicals.  If you relapse, do not give up! Plan ahead and think about what you will do the next time you get the urge to smoke. HOW WILL I FEEL WHEN I QUIT SMOKING? You may have symptoms of withdrawal because your body is used to nicotine (the addictive substance in cigarettes). You may crave cigarettes, be irritable, feel very hungry, cough often, get headaches, or have difficulty concentrating. The withdrawal symptoms are only temporary. They are strongest when you first quit but will go away within 10-14 days. When withdrawal symptoms occur, stay in control. Think about your reasons for quitting. Remind yourself that these are signs that your body is healing and getting used to being without cigarettes. Remember that withdrawal symptoms are easier to treat than the major diseases that smoking can cause.  Even after the  withdrawal is over, expect periodic urges to smoke. However, these cravings are generally short lived and will go away whether you smoke or not. Do not smoke! WHAT RESOURCES ARE AVAILABLE TO HELP ME QUIT SMOKING? Your health care provider can direct you to community resources or hospitals for support, which may include:  Group support.  Education.  Hypnosis.  Therapy.   This information is not intended to replace advice given to you by your health care provider. Make sure you discuss any questions you have with your health care provider.   Document Released: 04/07/2004 Document Revised: 07/31/2014 Document Reviewed: 12/26/2012 Elsevier Interactive Patient Education [employer].

## 2015-10-13 ENCOUNTER — Telehealth: Payer: Self-pay

## 2015-10-13 NOTE — Telephone Encounter (Signed)
This Case Manager placed call to patient to determine if home health services with [facility] started. Patient indicated he has heard from [facility], and they are supposed to come to his home on 10/19/15. Informed patient that they typically begin services 24-48 hours after discharge, and he indicated he wanted them to start on 10/19/15. Patient confirms that [place] with [facility] continues 1.5 hours/day, 7 days/week.  Patient denied needing an increase in care with [facility]. Informed patient that [place] has a Clinical Pharmacist who could provide diabetes management education and address use of his glucometer as patient unable to use current glucometer because of problems with his left hand. Patient declined appointment with Nicoletta Ba, Clinical Pharmacist, at this time and indicated to planned to discuss with [facility] RN during initiation of home health services. No additional needs/concerns identified.

## 2015-10-14 ENCOUNTER — Encounter: Payer: Self-pay | Admitting: Clinical

## 2015-10-14 NOTE — Progress Notes (Signed)
Depression screen [facility] 2/9 10/12/2015 03/15/2015 05/22/2014  Decreased Interest 1 0 0  Down, Depressed, Hopeless 0 0 0  PHQ - 2 Score 1 0 0    GAD 7 : Generalized Anxiety Score 10/12/2015  Nervous, Anxious, on Edge 0  Control/stop worrying 0  Worry too much - different things 0  Trouble relaxing 0  Restless 0  Easily annoyed or irritable 0  Afraid - awful might happen 0  Total GAD 7 Score 0

## 2015-10-20 ENCOUNTER — Ambulatory Visit (INDEPENDENT_AMBULATORY_CARE_PROVIDER_SITE_OTHER): Payer: Medicaid Other | Admitting: Podiatry

## 2015-10-20 ENCOUNTER — Encounter: Payer: Self-pay | Admitting: Podiatry

## 2015-10-20 DIAGNOSIS — E119 Type 2 diabetes mellitus without complications: Secondary | ICD-10-CM

## 2015-10-20 DIAGNOSIS — B351 Tinea unguium: Secondary | ICD-10-CM

## 2015-10-20 DIAGNOSIS — M79676 Pain in unspecified toe(s): Secondary | ICD-10-CM

## 2015-10-20 DIAGNOSIS — L84 Corns and callosities: Secondary | ICD-10-CM

## 2015-10-20 NOTE — Patient Instructions (Signed)
There is a small amount of bleeding after trimming the fourth left toenail today which was treated with antibiotic ointment and Band-Aid Removed Band-Aid on fourth left toe 1-3 days and continue to apply topical antibiotic ointment daily until a scab forms Wear shoes at all times  Diabetes and Foot Care Diabetes may cause you to have problems because of poor blood supply (circulation) to your feet and legs. This may cause the skin on your feet to become thinner, break easier, and heal more slowly. Your skin may become dry, and the skin may peel and crack. You may also have nerve damage in your legs and feet causing decreased feeling in them. You may not notice minor injuries to your feet that could lead to infections or more serious problems. Taking care of your feet is one of the most important things you can do for yourself.  HOME CARE INSTRUCTIONS  Wear shoes at all times, even in the house. Do not go barefoot. Bare feet are easily injured.  Check your feet daily for blisters, cuts, and redness. If you cannot see the bottom of your feet, use a mirror or ask someone for help.  Wash your feet with warm water (do not use hot water) and mild soap. Then pat your feet and the areas between your toes until they are completely dry. Do not soak your feet as this can dry your skin.  Apply a moisturizing lotion or petroleum jelly (that does not contain alcohol and is unscented) to the skin on your feet and to dry, brittle toenails. Do not apply lotion between your toes.  Trim your toenails straight across. Do not dig under them or around the cuticle. File the edges of your nails with an emery board or nail file.  Do not cut corns or calluses or try to remove them with medicine.  Wear clean socks or stockings every day. Make sure they are not too tight. Do not wear knee-high stockings since they may decrease blood flow to your legs.  Wear shoes that fit properly and have enough cushioning. To break in  new shoes, wear them for just a few hours a day. This prevents you from injuring your feet. Always look in your shoes before you put them on to be sure there are no objects inside.  Do not cross your legs. This may decrease the blood flow to your feet.  If you find a minor scrape, cut, or break in the skin on your feet, keep it and the skin around it clean and dry. These areas may be cleansed with mild soap and water. Do not cleanse the area with peroxide, alcohol, or iodine.  When you remove an adhesive bandage, be sure not to damage the skin around it.  If you have a wound, look at it several times a day to make sure it is healing.  Do not use heating pads or hot water bottles. They may burn your skin. If you have lost feeling in your feet or legs, you may not know it is happening until it is too late.  Make sure your health care provider performs a complete foot exam at least annually or more often if you have foot problems. Report any cuts, sores, or bruises to your health care provider immediately. SEEK MEDICAL CARE IF:   You have an injury that is not healing.  You have cuts or breaks in the skin.  You have an ingrown nail.  You notice redness on your legs  or feet.  You feel burning or tingling in your legs or feet.  You have pain or cramps in your legs and feet.  Your legs or feet are numb.  Your feet always feel cold. SEEK IMMEDIATE MEDICAL CARE IF:   There is increasing redness, swelling, or pain in or around a wound.  There is a red line that goes up your leg.  Pus is coming from a wound.  You develop a fever or as directed by your health care provider.  You notice a bad smell coming from an ulcer or wound.   This information is not intended to replace advice given to you by your health care provider. Make sure you discuss any questions you have with your health care provider.   Document Released: 07/07/2000 Document Revised: 03/12/2013 Document Reviewed:  12/17/2012 Elsevier Interactive Patient Education [employer].

## 2015-10-20 NOTE — Progress Notes (Signed)
Patient ID: [name], male   DOB: March 14, 1961, 55 y.o.   MRN: [id number]  Subjective: This patient presents for a scheduled visit complaining of thick and elongated toenails which are uncomfortable walking wearing shoes as well as a painful callus on the plantar aspect of the left hallux. He is requesting debridement of the nails as well as a callus.  He admits that he does not wear shoes at all times at home Patient's cousin present in treatment room today  Objective: No open skin lesions bilaterally The toenails are elongated, incurvated, discolored, deformed, hypertrophic and tender to direct palpation 6-10 Large bleeding callus sub-left hallux that remains closed after debridement Poor hygiene plantar left foot  Assessment: Symptomatic onychomycoses 6-10 Pre-ulcerative plantar callus left hallux Diabetic type 2 without applications  Plan: Debridement toenails 6-10 mechanically and electrically. Slight bleeding distal fourth left treated with antibiotic ointment and Band-Aid. Patient advised to remove Band-Aid 1-3 days and continue to apply topical antibiotic ointment daily until a scab forms Debrided plantar callus left hallux without any bleeding Advised patient to wear shoes at all times  Reappoint 3 months

## 2015-11-23 ENCOUNTER — Telehealth: Payer: Self-pay | Admitting: Family Medicine

## 2015-11-23 DIAGNOSIS — E1100 Type 2 diabetes mellitus with hyperosmolarity without nonketotic hyperglycemic-hyperosmolar coma (NKHHC): Secondary | ICD-10-CM

## 2015-11-23 NOTE — Telephone Encounter (Signed)
Case worker  Earlie Server From [facility] calling regarding patient medicine if you can please, call her back  [phone]  Thank You

## 2015-11-24 NOTE — Telephone Encounter (Signed)
Returned Dorothy call at [phone] LVM to return call

## 2015-11-24 NOTE — Telephone Encounter (Signed)
Is  the pt eligible for tele monitor?- equipment for wt check daily , BP HR OTSat  Nurse Dorothy stated he is unable to check glucose TID. Checking glucose once or twice x per day

## 2015-11-29 ENCOUNTER — Telehealth: Payer: Self-pay | Admitting: Family Medicine

## 2015-11-29 ENCOUNTER — Ambulatory Visit: Payer: Medicaid Other | Attending: Family Medicine | Admitting: Family Medicine

## 2015-11-29 ENCOUNTER — Encounter: Payer: Self-pay | Admitting: Family Medicine

## 2015-11-29 VITALS — BP 146/75 | HR 66 | Temp 98.6°F | Resp 16 | Ht 75.0 in | Wt 267.0 lb

## 2015-11-29 DIAGNOSIS — I10 Essential (primary) hypertension: Secondary | ICD-10-CM | POA: Insufficient documentation

## 2015-11-29 DIAGNOSIS — L089 Local infection of the skin and subcutaneous tissue, unspecified: Secondary | ICD-10-CM | POA: Diagnosis not present

## 2015-11-29 DIAGNOSIS — Z79899 Other long term (current) drug therapy: Secondary | ICD-10-CM | POA: Diagnosis not present

## 2015-11-29 DIAGNOSIS — Z7984 Long term (current) use of oral hypoglycemic drugs: Secondary | ICD-10-CM | POA: Insufficient documentation

## 2015-11-29 DIAGNOSIS — R809 Proteinuria, unspecified: Secondary | ICD-10-CM

## 2015-11-29 DIAGNOSIS — E119 Type 2 diabetes mellitus without complications: Secondary | ICD-10-CM | POA: Diagnosis present

## 2015-11-29 DIAGNOSIS — I509 Heart failure, unspecified: Secondary | ICD-10-CM | POA: Insufficient documentation

## 2015-11-29 DIAGNOSIS — IMO0001 Reserved for inherently not codable concepts without codable children: Secondary | ICD-10-CM

## 2015-11-29 DIAGNOSIS — L739 Follicular disorder, unspecified: Secondary | ICD-10-CM | POA: Diagnosis not present

## 2015-11-29 DIAGNOSIS — F17210 Nicotine dependence, cigarettes, uncomplicated: Secondary | ICD-10-CM | POA: Insufficient documentation

## 2015-11-29 DIAGNOSIS — Z0000 Encounter for general adult medical examination without abnormal findings: Secondary | ICD-10-CM | POA: Diagnosis not present

## 2015-11-29 LAB — GLUCOSE, POCT (MANUAL RESULT ENTRY): POC Glucose: 114 mg/dl — AB (ref 70–99)

## 2015-11-29 MED ORDER — DOXYCYCLINE HYCLATE 100 MG PO TABS: 100.0000 mg | ORAL_TABLET | Freq: Two times a day (BID) | ORAL | Status: DC

## 2015-11-29 MED ORDER — ATORVASTATIN CALCIUM 40 MG PO TABS
40.0000 mg | ORAL_TABLET | Freq: Every day | ORAL | Status: DC
Start: 2015-11-29 — End: 2016-01-28

## 2015-11-29 MED ORDER — METFORMIN HCL 1000 MG PO TABS
1000.0000 mg | ORAL_TABLET | Freq: Two times a day (BID) | ORAL | Status: DC
Start: 2015-11-29 — End: 2016-01-28

## 2015-11-29 MED ORDER — GLIPIZIDE 10 MG PO TABS: 10.0000 mg | ORAL_TABLET | Freq: Two times a day (BID) | ORAL | Status: DC

## 2015-11-29 MED ORDER — LISINOPRIL 40 MG PO TABS: 20.0000 mg | ORAL_TABLET | Freq: Every day | ORAL | Status: DC

## 2015-11-29 NOTE — Telephone Encounter (Signed)
Called [name] Patient does not have CHF either systolic for diastolic and is now checking his sugars at home No need for tele monitoring at this time

## 2015-11-29 NOTE — Progress Notes (Signed)
F/U DM CHF Glucose running 57 - 150 Taking medication as prescribe  No suicidal thoughts in the past two weeks  Tobacco user 2 cigarette per day  No pain today

## 2015-11-29 NOTE — Assessment & Plan Note (Signed)
A: improved Med: compliant P: Continue norvasc 5 mg daily Increase lisinopril to 40 mg daily

## 2015-11-29 NOTE — Assessment & Plan Note (Signed)
Scalp folliculitis Course of doxycyline Education provided

## 2015-11-29 NOTE — Telephone Encounter (Signed)
Called [name], Left VM Patient may be eligible for monitoring. Requested call back to discuss further.

## 2015-11-29 NOTE — Progress Notes (Signed)
Subjective:  Patient ID: [name], male    DOB: [birth year]  Age: 55 y.o. MRN: [id number]  CC: Diabetes and Congestive Heart Failure   HPI [name] presents for   1. CHRONIC DIABETES  Disease Monitoring  Blood Sugar Ranges: 57-150  Polyuria: no   Visual problems: no   Medication Compliance: yes  Medication Side Effects  Hypoglycemia: yes, down to 57 last week in the setting of not eating much    Preventitive Health Care  Eye Exam: due   Foot Exam: done today   Diet pattern: low carb   Exercise: no    2. CHRONIC HYPERTENSION  Disease Monitoring  Blood pressure range: not checking   Chest pain: no   Dyspnea: no   Claudication: no   Medication compliance: yes  Medication Side Effects  Lightheadedness: no   Urinary frequency: no   Edema: yes, trace, improving      3. Hair pustules: coming and going since 02/2015. Was treated with doxycycline. He clips his own hair at home. He denies folliculitis in the face.   4. Cocaine abuse: he admits to periodic use. He is working to quit. He reports that he lives alone and uses cocaine around some of his friends. He has been in treatment in the past. He declines treatment now.   Social History  Substance Use Topics  . Smoking status: Current Every Day Smoker -- 0.25 packs/day    Types: Cigarettes  . Smokeless tobacco: Never Used  . Alcohol Use: 0.0 oz/week    0 Standard drinks or equivalent per week     Comment: occassional    Outpatient Prescriptions Prior to Visit  Medication Sig Dispense Refill  . amLODipine (NORVASC) 5 MG tablet Take 1 tablet (5 mg total) by mouth daily. 90 tablet 3  . atorvastatin (LIPITOR) 40 MG tablet Take 1 tablet (40 mg total) by mouth daily. Must have office visit 30 tablet 0  . gabapentin (NEURONTIN) 300 MG capsule Take 1 capsule (300 mg total) by mouth 3 (three) times daily. Needs office visit for refills 90 capsule 0  . glipiZIDE (GLUCOTROL) 10 MG tablet Take 1 tablet (10 mg  total) by mouth 2 (two) times daily before a meal. 60 tablet 3  . glucose monitoring kit (FREESTYLE) monitoring kit 1 each by Does not apply route as needed for other. Dispense any model that is covered- dispense testing supplies for Q AC/ HS accuchecks- 1 month supply with one refil. 1 each 1  . lisinopril (PRINIVIL,ZESTRIL) 20 MG tablet Take 1 tablet (20 mg total) by mouth daily. Must have office visit 30 tablet 0  . metFORMIN (GLUCOPHAGE) 1000 MG tablet Take 1 tablet (1,000 mg total) by mouth 2 (two) times daily with a meal. Resume on 04/29/15.    Marland Kitchen guaiFENesin (MUCINEX) 600 MG 12 hr tablet Take 1 tablet (600 mg total) by mouth 2 (two) times daily as needed. (Patient not taking: Reported on 11/29/2015) 30 tablet 0  . oseltamivir (TAMIFLU) 75 MG capsule Take 1 capsule (75 mg total) by mouth 2 (two) times daily. (Patient not taking: Reported on 10/12/2015) 6 capsule 0   No facility-administered medications prior to visit.    ROS Review of Systems  Constitutional: Negative for fever, chills, fatigue and unexpected weight change.  Eyes: Negative for visual disturbance.  Respiratory: Negative for cough and shortness of breath.   Cardiovascular: Negative for chest pain, palpitations and leg swelling.  Gastrointestinal: Negative for nausea, vomiting, abdominal pain,  diarrhea, constipation and blood in stool.  Endocrine: Negative for polydipsia, polyphagia and polyuria.  Musculoskeletal: Negative for myalgias, back pain, arthralgias, gait problem and neck pain.  Skin: Positive for wound. Negative for rash.  Allergic/Immunologic: Negative for immunocompromised state.  Neurological: Positive for weakness (on L side since stroke ) and numbness (in Feet L >R).  Hematological: Negative for adenopathy. Does not bruise/bleed easily.  Psychiatric/Behavioral: Negative for suicidal ideas, sleep disturbance and dysphoric mood. The patient is not nervous/anxious.     Objective:  BP 146/75 mmHg  Pulse 66   Temp(Src) 98.6 F (37 C) (Oral)  Resp 16  Ht _0  (1.905 m)  Wt 267 lb (121.11 kg)  BMI 33.37 kg/m2  SpO2 99%  BP/Weight 11/29/2015 10/12/2015 4/78/2956  Systolic BP 213 086 578  Diastolic BP 75 85 70  Wt. (Lbs) 267 277.4 283  BMI 33.37 34.67 35.37   Physical Exam  Constitutional: He appears well-developed and well-nourished. No distress.  HENT:  Head: Normocephalic and atraumatic.    Neck: Normal range of motion. Neck supple.  Cardiovascular: Normal rate, regular rhythm, normal heart sounds and intact distal pulses.   Pulmonary/Chest: Effort normal and breath sounds normal.  Musculoskeletal: He exhibits edema (trace LE ).       Feet:  Neurological: He is alert.  Skin: Skin is warm and dry. No rash noted. No erythema.  Psychiatric: He has a normal mood and affect.   Lab Results  Component Value Date   HGBA1C 7.7 10/12/2015   CBG 114  Assessment & Plan:   [name] was seen today for diabetes and congestive heart failure.  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 Resume on 04/29/15. -     Ambulatory referral to Ophthalmology -     atorvastatin (LIPITOR) 40 MG tablet; Take 1 tablet (40 mg total) by mouth daily.  Folliculitis -     doxycycline (VIBRA-TABS) 100 MG tablet; Take 1 tablet (100 mg total) by mouth 2 (two) times daily.  Essential hypertension -     lisinopril (PRINIVIL,ZESTRIL) 40 MG tablet; Take 0.5 tablets (20 mg total) by mouth daily.  Healthcare maintenance -     Ambulatory referral to Gastroenterology    No orders of the defined types were placed in this encounter.    Follow-up: No Follow-up on file.   Boykin Nearing MD

## 2015-11-29 NOTE — Assessment & Plan Note (Signed)
Improved Continue current regimen Opthalmology referral placed for eye exam

## 2015-11-29 NOTE — Patient Instructions (Addendum)
[name] was seen today for diabetes and congestive heart failure.  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 Resume on 04/29/15. -     Ambulatory referral to Ophthalmology -     atorvastatin (LIPITOR) 40 MG tablet; Take 1 tablet (40 mg total) by mouth daily.  Folliculitis -     doxycycline (VIBRA-TABS) 100 MG tablet; Take 1 tablet (100 mg total) by mouth 2 (two) times daily.  Essential hypertension -     lisinopril (PRINIVIL,ZESTRIL) 40 MG tablet; Take 0.5 tablets (20 mg total) by mouth daily.  Healthcare maintenance -     Ambulatory referral to Gastroenterology   F/u in 2 months for A1c  Dr. Adrian Blackwater   Folliculitis Folliculitis is redness, soreness, and swelling (inflammation) of the hair follicles. This condition can occur anywhere on the body. People with weakened immune systems, diabetes, or obesity have a greater risk of getting folliculitis. CAUSES  Bacterial infection. This is the most common cause.  Fungal infection.  Viral infection.  Contact with certain chemicals, especially oils and tars. Long-term folliculitis can result from bacteria that live in the nostrils. The bacteria may trigger multiple outbreaks of folliculitis over time. SYMPTOMS Folliculitis most commonly occurs on the scalp, thighs, legs, back, buttocks, and areas where hair is shaved frequently. An early sign of folliculitis is a small, white or yellow, pus-filled, itchy lesion (pustule). These lesions appear on a red, inflamed follicle. They are usually less than 0.2 inches (5 mm) wide. When there is an infection of the follicle that goes deeper, it becomes a boil or furuncle. A group of closely packed boils creates a larger lesion (carbuncle). Carbuncles tend to occur in hairy, sweaty areas of the  body. DIAGNOSIS  Your caregiver can usually tell what is wrong by doing a physical exam. A sample may be taken from one of the lesions and tested in a lab. This can help determine what is causing your folliculitis. TREATMENT  Treatment may include:  Applying warm compresses to the affected areas.  Taking antibiotic medicines orally or applying them to the skin.  Draining the lesions if they contain a large amount of pus or fluid.  Laser hair removal for cases of long-lasting folliculitis. This helps to prevent regrowth of the hair. HOME CARE INSTRUCTIONS  Apply warm compresses to the affected areas as directed by your caregiver.  If antibiotics are prescribed, take them as directed. Finish them even if you start to feel better.  You may take over-the-counter medicines to relieve itching.  Do not shave irritated skin.  Follow up with your caregiver as directed. SEEK IMMEDIATE MEDICAL CARE IF:   You have increasing redness, swelling, or pain in the affected area.  You have a fever. MAKE SURE YOU:  Understand these instructions.  Will watch your condition.  Will get help right away if you are not doing well or get worse.   This information is not intended to replace advice given to you by your health care provider. Make sure you discuss any questions you have with your health care provider.   Document Released: 09/18/2001 Document Revised: 07/31/2014 Document Reviewed: 10/10/2011 Elsevier Interactive Patient Education [employer].

## 2015-11-29 NOTE — Telephone Encounter (Signed)
[name] from [facility] returning your call.

## 2015-12-17 ENCOUNTER — Emergency Department (HOSPITAL_COMMUNITY): Payer: Medicaid Other

## 2015-12-17 ENCOUNTER — Encounter (HOSPITAL_COMMUNITY): Payer: Self-pay

## 2015-12-17 ENCOUNTER — Emergency Department (HOSPITAL_COMMUNITY)
Admission: EM | Admit: 2015-12-17 | Discharge: 2015-12-17 | Disposition: A | Discharge status: Home / Self Care | Payer: Medicaid Other | Attending: Emergency Medicine | Admitting: Emergency Medicine

## 2015-12-17 DIAGNOSIS — Z79899 Other long term (current) drug therapy: Secondary | ICD-10-CM | POA: Insufficient documentation

## 2015-12-17 DIAGNOSIS — E11649 Type 2 diabetes mellitus with hypoglycemia without coma: Secondary | ICD-10-CM | POA: Diagnosis present

## 2015-12-17 DIAGNOSIS — F17210 Nicotine dependence, cigarettes, uncomplicated: Secondary | ICD-10-CM | POA: Diagnosis not present

## 2015-12-17 DIAGNOSIS — I10 Essential (primary) hypertension: Secondary | ICD-10-CM | POA: Diagnosis not present

## 2015-12-17 DIAGNOSIS — Z7984 Long term (current) use of oral hypoglycemic drugs: Secondary | ICD-10-CM | POA: Diagnosis not present

## 2015-12-17 LAB — COMPREHENSIVE METABOLIC PANEL
ALBUMIN: 3.6 g/dL (ref 3.5–5.0)
ALT: 16 U/L — AB (ref 17–63)
ANION GAP: 8 (ref 5–15)
AST: 18 U/L (ref 15–41)
Alkaline Phosphatase: 51 U/L (ref 38–126)
BUN: 11 mg/dL (ref 6–20)
CALCIUM: 9.2 mg/dL (ref 8.9–10.3)
CO2: 24 mmol/L (ref 22–32)
Chloride: 106 mmol/L (ref 101–111)
Creatinine, Ser: 1.11 mg/dL (ref 0.61–1.24)
GFR calc Af Amer: >60 mL/min (ref >60)
GFR calc non Af Amer: >60 mL/min (ref >60)
Glucose, Bld: 89 mg/dL (ref 65–99)
Potassium: 4.5 mmol/L (ref 3.5–5.1)
SODIUM: 138 mmol/L (ref 135–145)
TOTAL PROTEIN: 6.8 g/dL (ref 6.5–8.1)
Total Bilirubin: 0.4 mg/dL (ref 0.3–1.2)

## 2015-12-17 LAB — CBC WITH DIFFERENTIAL/PLATELET
BASOS PCT: 1 %
Basophils Absolute: 0.1 10*3/uL (ref 0.0–0.1)
EOS ABS: 0.1 10*3/uL (ref 0.0–0.7)
EOS PCT: 2 %
HCT: 44.4 % (ref 39.0–52.0)
HEMOGLOBIN: 14.3 g/dL (ref 13.0–17.0)
Lymphocytes Relative: 28 %
Lymphs Abs: 2.1 10*3/uL (ref 0.7–4.0)
MCH: 26.3 pg (ref 26.0–34.0)
MCHC: 32.2 g/dL (ref 30.0–36.0)
MCV: 81.8 fL (ref 78.0–100.0)
Monocytes Absolute: 0.7 10*3/uL (ref 0.1–1.0)
Monocytes Relative: 9 %
NEUTROS PCT: 60 %
Neutro Abs: 4.6 10*3/uL (ref 1.7–7.7)
PLATELETS: 242 10*3/uL (ref 150–400)
RBC: 5.43 MIL/uL (ref 4.22–5.81)
RDW: 14.2 % (ref 11.5–15.5)
WBC: 7.6 10*3/uL (ref 4.0–10.5)

## 2015-12-17 LAB — URINALYSIS, ROUTINE W REFLEX MICROSCOPIC
Bilirubin Urine: NEGATIVE
GLUCOSE, UA: NEGATIVE mg/dL
Hgb urine dipstick: NEGATIVE
Ketones, ur: NEGATIVE mg/dL
LEUKOCYTES UA: NEGATIVE
NITRITE: NEGATIVE
PH: 6.5 (ref 5.0–8.0)
Protein, ur: 100 mg/dL — AB
SPECIFIC GRAVITY, URINE: 1.016 (ref 1.005–1.030)

## 2015-12-17 LAB — URINE MICROSCOPIC-ADD ON
BACTERIA UA: NONE SEEN
RBC / HPF: NONE SEEN RBC/hpf (ref 0–5)

## 2015-12-17 LAB — CBG MONITORING, ED: GLUCOSE-CAPILLARY: 129 mg/dL — AB (ref 65–99)

## 2015-12-17 NOTE — Discharge Instructions (Signed)
Be sure to eat regular meals at home.  You may decrease your diabetic medications.  Take a half tablet of your glipizide twice daily and a half tablet of your metformin twice daily.    Diabetes Mellitus and Food It is important for you to manage your blood sugar (glucose) level. Your blood glucose level can be greatly affected by what you eat. Eating healthier foods in the appropriate amounts throughout the day at about the same time each day will help you control your blood glucose level. It can also help slow or prevent worsening of your diabetes mellitus. Healthy eating may even help you improve the level of your blood pressure and reach or maintain a healthy weight.  General recommendations for healthful eating and cooking habits include:  Eating meals and snacks regularly. Avoid going long periods of time without eating to lose weight.  Eating a diet that consists mainly of plant-based foods, such as fruits, vegetables, nuts, legumes, and whole grains.  Using low-heat cooking methods, such as baking, instead of high-heat cooking methods, such as deep frying. Work with your dietitian to make sure you understand how to use the Nutrition Facts information on food labels. HOW CAN FOOD AFFECT ME? Carbohydrates Carbohydrates affect your blood glucose level more than any other type of food. Your dietitian will help you determine how many carbohydrates to eat at each meal and teach you how to count carbohydrates. Counting carbohydrates is important to keep your blood glucose at a healthy level, especially if you are using insulin or taking certain medicines for diabetes mellitus. Alcohol Alcohol can cause sudden decreases in blood glucose (hypoglycemia), especially if you use insulin or take certain medicines for diabetes mellitus. Hypoglycemia can be a life-threatening condition. Symptoms of hypoglycemia (sleepiness, dizziness, and disorientation) are similar to symptoms of having too much alcohol.    If your health care provider has given you approval to drink alcohol, do so in moderation and use the following guidelines:  Women should not have more than one drink per day, and men should not have more than two drinks per day. One drink is equal to:  12 oz of beer.  5 oz of wine.  1 oz of hard liquor.  Do not drink on an empty stomach.  Keep yourself hydrated. Have water, diet soda, or unsweetened iced tea.  Regular soda, juice, and other mixers might contain a lot of carbohydrates and should be counted. WHAT FOODS ARE NOT RECOMMENDED? As you make food choices, it is important to remember that all foods are not the same. Some foods have fewer nutrients per serving than other foods, even though they might have the same number of calories or carbohydrates. It is difficult to get your body what it needs when you eat foods with fewer nutrients. Examples of foods that you should avoid that are high in calories and carbohydrates but low in nutrients include:  Trans fats (most processed foods list trans fats on the Nutrition Facts label).  Regular soda.  Juice.  Candy.  Sweets, such as cake, pie, doughnuts, and cookies.  Fried foods. WHAT FOODS CAN I EAT? Eat nutrient-rich foods, which will nourish your body and keep you healthy. The food you should eat also will depend on several factors, including:  The calories you need.  The medicines you take.  Your weight.  Your blood glucose level.  Your blood pressure level.  Your cholesterol level. You should eat a variety of foods, including:  Protein.  Lean cuts  of meat.  Proteins low in saturated fats, such as fish, egg whites, and beans. Avoid processed meats.  Fruits and vegetables.  Fruits and vegetables that may help control blood glucose levels, such as apples, mangoes, and yams.  Dairy products.  Choose fat-free or low-fat dairy products, such as milk, yogurt, and cheese.  Grains, bread, pasta, and  rice.  Choose whole grain products, such as multigrain bread, whole oats, and brown rice. These foods may help control blood pressure.  Fats.  Foods containing healthful fats, such as nuts, avocado, olive oil, canola oil, and fish. DOES EVERYONE WITH DIABETES MELLITUS HAVE THE SAME MEAL PLAN? Because every person with diabetes mellitus is different, there is not one meal plan that works for everyone. It is very important that you meet with a dietitian who will help you create a meal plan that is just right for you.   This information is not intended to replace advice given to you by your health care provider. Make sure you discuss any questions you have with your health care provider.   Document Released: 04/06/2005 Document Revised: 07/31/2014 Document Reviewed: 06/06/2013 Elsevier Interactive Patient Education 2016 [employer] Hypoglycemia Hypoglycemia occurs when the glucose in your blood is too low. Glucose is a type of sugar that is your body's main energy source. Hormones, such as insulin and glucagon, control the level of glucose in the blood. Insulin lowers blood glucose and glucagon increases blood glucose. Having too much insulin in your blood stream, or not eating enough food containing sugar, can result in hypoglycemia. Hypoglycemia can happen to people with or without diabetes. It can develop quickly and can be a medical emergency.  CAUSES   Missing or delaying meals.  Not eating enough carbohydrates at meals.  Taking too much diabetes medicine.  Not timing your oral diabetes medicine or insulin doses with meals, snacks, and exercise.  Nausea and vomiting.  Certain medicines.  Severe illnesses, such as hepatitis, kidney disorders, and certain eating disorders.  Increased activity or exercise without eating something extra or adjusting medicines.  Drinking too much alcohol.  A nerve disorder that affects body functions like your heart rate, blood pressure, and  digestion (autonomic neuropathy).  A condition where the stomach muscles do not function properly (gastroparesis). Therefore, medicines and food may not absorb properly.  Rarely, a tumor of the pancreas can produce too much insulin. SYMPTOMS   Hunger.  Sweating (diaphoresis).  Change in body temperature.  Shakiness.  Headache.  Anxiety.  Lightheadedness.  Irritability.  Difficulty concentrating.  Dry mouth.  Tingling or numbness in the hands or feet.  Restless sleep or sleep disturbances.  Altered speech and coordination.  Change in mental status.  Seizures or prolonged convulsions.  Combativeness.  Drowsiness (lethargic).  Weakness.  Increased heart rate or palpitations.  Confusion.  Pale, gray skin color.  Blurred or double vision.  Fainting. DIAGNOSIS  A physical exam and medical history will be performed. Your caregiver may make a diagnosis based on your symptoms. Blood tests and other lab tests may be performed to confirm a diagnosis. Once the diagnosis is made, your caregiver will see if your signs and symptoms go away once your blood glucose is raised.  TREATMENT  Usually, you can easily treat your hypoglycemia when you notice symptoms.  Check your blood glucose. If it is less than 70 mg/dl, take one of the following:   3-4 glucose tablets.    cup juice.    cup regular soda.  1 cup skim milk.   -1 tube of glucose gel.   5-6 hard candies.   Avoid high-fat drinks or food that may delay a rise in blood glucose levels.  Do not take more than the recommended amount of sugary foods, drinks, gel, or tablets. Doing so will cause your blood glucose to go too high.   Wait 10-15 minutes and recheck your blood glucose. If it is still less than 70 mg/dl or below your target range, repeat treatment.   Eat a snack if it is more than 1 hour until your next meal.  There may be a time when your blood glucose may go so low that you are  unable to treat yourself at home when you start to notice symptoms. You may need someone to help you. You may even faint or be unable to swallow. If you cannot treat yourself, someone will need to bring you to the hospital.  [place]  If you have diabetes, follow your diabetes management plan by:  Taking your medicines as directed.  Following your exercise plan.  Following your meal plan. Do not skip meals. Eat on time.  Testing your blood glucose regularly. Check your blood glucose before and after exercise. If you exercise longer or different than usual, be sure to check blood glucose more frequently.  Wearing your medical alert jewelry that says you have diabetes.  Identify the cause of your hypoglycemia. Then, develop ways to prevent the recurrence of hypoglycemia.  Do not take a hot bath or shower right after an insulin shot.  Always carry treatment with you. Glucose tablets are the easiest to carry.  If you are going to drink alcohol, drink it only with meals.  Tell friends or family members ways to keep you safe during a seizure. This may include removing hard or sharp objects from the area or turning you on your side.  Maintain a healthy weight. SEEK MEDICAL CARE IF:   You are having problems keeping your blood glucose in your target range.  You are having frequent episodes of hypoglycemia.  You feel you might be having side effects from your medicines.  You are not sure why your blood glucose is dropping so low.  You notice a change in vision or a new problem with your vision. SEEK IMMEDIATE MEDICAL CARE IF:   Confusion develops.  A change in mental status occurs.  The inability to swallow develops.  Fainting occurs.   This information is not intended to replace advice given to you by your health care provider. Make sure you discuss any questions you have with your health care provider.   Document Released: 07/10/2005 Document Revised:  07/15/2013 Document Reviewed: 03/16/2015 Elsevier Interactive Patient Education [employer].

## 2015-12-17 NOTE — ED Notes (Signed)
Pt. Coming from home via [facility] for hypoglycemia and new onset stuttering. Pt. Feeling weak at 1000, checked his sugar, and the sugar was 46. Pt. Drank juice and got the CBG up to 96. Pt. PCP advised from him to come get checked out. Pt. sts that he has no history of this change in speech. Pt. Aox4. EMS reports no other neuro def. EDP at bedside.

## 2015-12-17 NOTE — ED Provider Notes (Signed)
CSN: [id number]     Arrival date & time 12/17/15  1157 History   First MD Initiated Contact with Patient 12/17/15 1159     Chief Complaint  Patient presents with  . Stuttering  . Hypoglycemia    Patient is a 55 y.o. male presenting with hypoglycemia. The history is provided by the patient and the EMS personnel. No language interpreter was used.  Hypoglycemia  Stanislaw CHRISTIANJAMES SOULE is a 55 y.o. male who presents to the Emergency Department complaining of hypoglycemia.  He has a history of diabetes and takes metformin and glipizide daily. His last dose of those medications was yesterday morning. He skipped the evening dose because he's had recurrent episodes of hypoglycemia lately. This morning when he checked his blood sugar was 46 and he also noticed that he has stuttering speech that is new for him. He drinks some juice and his blood sugar did it. He denies any fevers, vomiting, pain, dysuria. He has routinely been skipping his evening dose of metformin and glipizide due to his ongoing hypoglycemia.  Past Medical History  Diagnosis Date  . Diabetes mellitus   . Hypertension   . 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 Cocaine abuse   . Chronic pain   . Alcohol abuse   . Tobacco abuse   . Gait instability   . Chronic arm pain   . Chronic leg pain   . Peripheral edema     chronic LUE   Past Surgical History  Procedure Laterality Date  . Left arm skin graft    . Left heart catheterization with coronary angiogram Bilateral 12/26/2013    Procedure: LEFT HEART CATHETERIZATION WITH CORONARY ANGIOGRAM;  Surgeon: Burnell Blanks, MD;  Location: [facility] CATH LAB;  Service: Cardiovascular;  Laterality: Bilateral;  . Cardiac catheterization  12/27/2013  . Cardiac catheterization  12/27/2013    Impression: 1. 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 Moderate disease in the small caliber intermediate branch, 3. Normal LV systolic function   Family History  Problem Relation Age of  Onset  . Hypertension Mother   . Diabetes Mother   . Cancer Mother     breast cancer   . Hypertension Father   . Heart disease Father   . Stroke Father   . Hypertension Sister    Social History  Substance Use Topics  . Smoking status: Current Every Day Smoker -- 0.25 packs/day    Types: Cigarettes  . Smokeless tobacco: Never Used  . Alcohol Use: 0.0 oz/week    0 Standard drinks or equivalent per week     Comment: occassional    Review of Systems  All other systems reviewed and are negative.     Allergies  Review of patient's allergies indicates no known allergies.  Home Medications   Prior to Admission medications   Medication Sig Start Date End Date Taking? Authorizing Provider  atorvastatin (LIPITOR) 40 MG tablet Take 1 tablet (40 mg total) by mouth daily. 11/29/15  Yes Josalyn Funches, MD  gabapentin (NEURONTIN) 300 MG capsule Take 1 capsule (300 mg total) by mouth 3 (three) times daily. Needs office visit for refills 09/02/15  Yes Josalyn Funches, MD  glipiZIDE (GLUCOTROL) 10 MG tablet Take 1 tablet (10 mg total) by mouth 2 (two) times daily before a meal. 11/29/15  Yes Josalyn Funches, MD  glucose monitoring kit (FREESTYLE) monitoring kit 1 each by Does not apply route as needed for other. Dispense any model that  is covered- dispense testing supplies for Q AC/ HS accuchecks- 1 month supply with one refil. 05/22/14  Yes Lorayne Marek, MD  lisinopril (PRINIVIL,ZESTRIL) 40 MG tablet Take 0.5 tablets (20 mg total) by mouth daily. 11/29/15  Yes Boykin Nearing, MD  metFORMIN (GLUCOPHAGE) 1000 MG tablet Take 1 tablet (1,000 mg total) by mouth 2 (two) times daily with a meal. Resume on 04/29/15. 11/29/15  Yes Josalyn Funches, MD  amLODipine (NORVASC) 5 MG tablet Take 1 tablet (5 mg total) by mouth daily. Patient not taking: Reported on 12/17/2015 10/12/15   Maren Reamer, MD   BP 143/84 mmHg  Pulse 61  Temp(Src) 97.8 F (36.6 C) (Oral)  Resp 16  Ht '6\' 3"'  (1.905 m)  Wt 267 lb  (121.11 kg)  BMI 33.37 kg/m2  SpO2 100% Physical Exam  Constitutional: He is oriented to person, place, and time. He appears well-developed and well-nourished.  HENT:  Head: Normocephalic and atraumatic.  Cardiovascular: Normal rate and regular rhythm.   No murmur heard. Pulmonary/Chest: Effort normal and breath sounds normal. No respiratory distress.  Abdominal: Soft. There is no tenderness. There is no rebound and no guarding.  Musculoskeletal: He exhibits no edema or tenderness.  Neurological: He is alert and oriented to person, place, and time.  Stuttering speech.  LUE, LLE weakness (pt states it is chronic).  Skin: Skin is warm and dry.  Psychiatric: He has a normal mood and affect. His behavior is normal.  Nursing note and vitals reviewed.   ED Course  Procedures (including critical care time) Labs Review Labs Reviewed  COMPREHENSIVE METABOLIC PANEL - Abnormal; Notable for the following:    ALT 16 (*)    All other components within normal limits  URINALYSIS, ROUTINE W REFLEX MICROSCOPIC (NOT AT [facility]) - Abnormal; Notable for the following:    Protein, ur 100 (*)    All other components within normal limits  URINE MICROSCOPIC-ADD ON - Abnormal; Notable for the following:    Squamous Epithelial / LPF 0-5 (*)    All other components within normal limits  CBC WITH DIFFERENTIAL/PLATELET  CBG MONITORING, ED  CBG MONITORING, ED    Imaging Review Ct Head Wo Contrast  12/17/2015  CLINICAL DATA:  New onset stuttering EXAM: CT HEAD WITHOUT CONTRAST TECHNIQUE: Contiguous axial images were obtained from the base of the skull through the vertex without intravenous contrast. COMPARISON:  04/26/2015 FINDINGS: The bony calvarium is intact. Multiple subcutaneous nodules are seen stable from the prior exam. These again likely represent sebaceous cysts. Mild atrophic changes are noted. Absence of the septum pellucidum is again noted. No findings to suggest acute hemorrhage, acute infarction  or space-occupying mass lesion are noted. IMPRESSION: No acute abnormality noted. No significant change from the prior exam. Electronically Signed   By: Inez Catalina M.D.   On: 12/17/2015 14:08   I have personally reviewed and evaluated these images and lab results as part of my medical decision-making.   EKG Interpretation   Date/Time:  Friday Dec 17 2015 12:06:13 EDT Ventricular Rate:  68 PR Interval:  157 QRS Duration: 118 QT Interval:  422 QTC Calculation: 449 R Axis:   -59 Text Interpretation:  Sinus rhythm Left anterior fascicular block Left  ventricular hypertrophy Abnormal T, consider ischemia, inferior leads ST  elevation, consider anterior injury Baseline wander Confirmed by Hazle Coca  [phone]) on 12/17/2015 12:08:27 PM      MDM   Final diagnoses:  Hypoglycemia associated with type 2 diabetes mellitus ([place])  Patient here for evaluation of hypoglycemia that resolved after hopping juice at home. He has been having recurrent episodes of hypoglycemia off and on lately and has been decreasing his metformin and glipizide.  No evidence of acute kidney injury, UTI, acute infection. Presentation is not consistent with CVA. Discussed with patient hypoglycemia. We will decrease his diabetic medications. Will DC home with outpatient follow-up. Return precautions discussed.  Quintella Reichert, MD 12/17/15 1536

## 2015-12-17 NOTE — ED Notes (Signed)
Dr. Ralene Bathe advised patient may eat after bedside swallow screen passed.

## 2015-12-28 LAB — HM DIABETES EYE EXAM

## 2016-01-10 ENCOUNTER — Encounter: Payer: Self-pay | Admitting: Family Medicine

## 2016-01-10 DIAGNOSIS — E113299 Type 2 diabetes mellitus with mild nonproliferative diabetic retinopathy without macular edema, unspecified eye: Secondary | ICD-10-CM | POA: Insufficient documentation

## 2016-01-10 DIAGNOSIS — H35033 Hypertensive retinopathy, bilateral: Secondary | ICD-10-CM | POA: Insufficient documentation

## 2016-01-23 ENCOUNTER — Emergency Department (HOSPITAL_COMMUNITY)
Admission: EM | Admit: 2016-01-23 | Discharge: 2016-01-23 | Disposition: A | Discharge status: Home / Self Care | Payer: Medicaid Other | Attending: Emergency Medicine | Admitting: Emergency Medicine

## 2016-01-23 ENCOUNTER — Emergency Department (HOSPITAL_COMMUNITY): Payer: Medicaid Other

## 2016-01-23 ENCOUNTER — Encounter (HOSPITAL_COMMUNITY): Payer: Self-pay

## 2016-01-23 DIAGNOSIS — I10 Essential (primary) hypertension: Secondary | ICD-10-CM | POA: Insufficient documentation

## 2016-01-23 DIAGNOSIS — F17210 Nicotine dependence, cigarettes, uncomplicated: Secondary | ICD-10-CM | POA: Insufficient documentation

## 2016-01-23 DIAGNOSIS — R531 Weakness: Secondary | ICD-10-CM | POA: Diagnosis present

## 2016-01-23 DIAGNOSIS — E119 Type 2 diabetes mellitus without complications: Secondary | ICD-10-CM | POA: Diagnosis not present

## 2016-01-23 DIAGNOSIS — Z7984 Long term (current) use of oral hypoglycemic drugs: Secondary | ICD-10-CM | POA: Insufficient documentation

## 2016-01-23 DIAGNOSIS — F1410 Cocaine abuse, uncomplicated: Secondary | ICD-10-CM | POA: Insufficient documentation

## 2016-01-23 DIAGNOSIS — Z79899 Other long term (current) drug therapy: Secondary | ICD-10-CM | POA: Diagnosis not present

## 2016-01-23 LAB — RAPID URINE DRUG SCREEN, HOSP PERFORMED
Amphetamines: NOT DETECTED
BARBITURATES: NOT DETECTED
Benzodiazepines: NOT DETECTED
Cocaine: POSITIVE — AB
OPIATES: NOT DETECTED
TETRAHYDROCANNABINOL: NOT DETECTED

## 2016-01-23 LAB — URINALYSIS, ROUTINE W REFLEX MICROSCOPIC
BILIRUBIN URINE: NEGATIVE
Glucose, UA: NEGATIVE mg/dL
KETONES UR: NEGATIVE mg/dL
LEUKOCYTES UA: NEGATIVE
NITRITE: NEGATIVE
PH: 5.5 (ref 5.0–8.0)
PROTEIN: 30 mg/dL — AB
Specific Gravity, Urine: 1.009 (ref 1.005–1.030)

## 2016-01-23 LAB — CBC WITH DIFFERENTIAL/PLATELET
BASOS ABS: 0 10*3/uL (ref 0.0–0.1)
BASOS PCT: 0 %
EOS PCT: 1 %
Eosinophils Absolute: 0.1 10*3/uL (ref 0.0–0.7)
HCT: 43.2 % (ref 39.0–52.0)
Hemoglobin: 14.4 g/dL (ref 13.0–17.0)
LYMPHS PCT: 21 %
Lymphs Abs: 1.9 10*3/uL (ref 0.7–4.0)
MCH: 27.4 pg (ref 26.0–34.0)
MCHC: 33.3 g/dL (ref 30.0–36.0)
MCV: 82.1 fL (ref 78.0–100.0)
Monocytes Absolute: 1.2 10*3/uL — ABNORMAL HIGH (ref 0.1–1.0)
Monocytes Relative: 13 %
NEUTROS ABS: 6.1 10*3/uL (ref 1.7–7.7)
Neutrophils Relative %: 65 %
PLATELETS: 214 10*3/uL (ref 150–400)
RBC: 5.26 MIL/uL (ref 4.22–5.81)
RDW: 13.8 % (ref 11.5–15.5)
WBC: 9.4 10*3/uL (ref 4.0–10.5)

## 2016-01-23 LAB — COMPREHENSIVE METABOLIC PANEL
ALK PHOS: 56 U/L (ref 38–126)
ALT: 15 U/L — ABNORMAL LOW (ref 17–63)
ANION GAP: 9 (ref 5–15)
AST: 16 U/L (ref 15–41)
Albumin: 3.7 g/dL (ref 3.5–5.0)
BILIRUBIN TOTAL: 0.7 mg/dL (ref 0.3–1.2)
BUN: 18 mg/dL (ref 6–20)
CALCIUM: 9.2 mg/dL (ref 8.9–10.3)
CO2: 22 mmol/L (ref 22–32)
Chloride: 104 mmol/L (ref 101–111)
Creatinine, Ser: 1.09 mg/dL (ref 0.61–1.24)
GFR calc Af Amer: >60 mL/min (ref >60)
Glucose, Bld: 109 mg/dL — ABNORMAL HIGH (ref 65–99)
POTASSIUM: 4 mmol/L (ref 3.5–5.1)
Sodium: 135 mmol/L (ref 135–145)
TOTAL PROTEIN: 6.6 g/dL (ref 6.5–8.1)

## 2016-01-23 LAB — URINE MICROSCOPIC-ADD ON
Bacteria, UA: NONE SEEN
RBC / HPF: NONE SEEN RBC/hpf (ref 0–5)
SQUAMOUS EPITHELIAL / LPF: NONE SEEN
WBC UA: NONE SEEN WBC/hpf (ref 0–5)

## 2016-01-23 LAB — TROPONIN I: Troponin I: 0.03 ng/mL (ref <0.03)

## 2016-01-23 NOTE — ED Notes (Signed)
Per EMS: Pt from home, complaining of generalized weakness. Pt states L sided weakness x few hours, unknown LSN. Pt with obvious weakness to Left side. No obvious slurred speech or confusion. Pt denies any pain. MD at bedside.

## 2016-01-23 NOTE — ED Provider Notes (Signed)
CSN: [id number]     Arrival date & time 01/23/16  1909 History   First MD Initiated Contact with Patient 01/23/16 1914     Chief Complaint  Patient presents with  . Extremity Weakness  PT IS A 55 YO BM WITH A HX OF LEFT SIDED WEAKNESS.  HE PRESENTS FROM HOME C/O WORSENING WEAKNESS.  THE PT SAID THAT HE WAS UNABLE TO GET UP OUT OF THE CHAIR TODAY WHICH PROMPTED HIM TO CALL EMS.  THE PT HAS HAD THIS PROBLEM FOR THE PAST FEW YEARS.  HE HAS BEEN TO THE ED FOR SIMILAR COMPLAINTS.  HE IS FOLLOWED BY DR. Krista Blue (NEURO).  HE WAS LAST SEEN BY DR. YAN ON 07/07/15.  THE PT'S WEAKNESS IS THOUGHT TO BE MULTIFACTORIAL FROM A LEFT POSTERIOR C5-C6 SPINAL CORD TRAUMA, CONGENITAL HYDROCEPHALUS, ALCOHOL ABUSE, AND DIABETES.     (Consider location/radiation/quality/duration/timing/severity/associated sxs/prior Treatment) The history is provided by the patient.    Past Medical History  Diagnosis Date  . Diabetes mellitus   . Hypertension   . 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 Cocaine abuse   . Chronic pain   . Alcohol abuse   . Tobacco abuse   . Gait instability   . Chronic arm pain   . Chronic leg pain   . Peripheral edema     chronic LUE   Past Surgical History  Procedure Laterality Date  . Left arm skin graft    . Left heart catheterization with coronary angiogram Bilateral 12/26/2013    Procedure: LEFT HEART CATHETERIZATION WITH CORONARY ANGIOGRAM;  Surgeon: Burnell Blanks, MD;  Location: [facility] CATH LAB;  Service: Cardiovascular;  Laterality: Bilateral;  . Cardiac catheterization  12/27/2013  . Cardiac catheterization  12/27/2013    Impression: 1. 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 Moderate disease in the small caliber intermediate branch, 3. Normal LV systolic function   Family History  Problem Relation Age of Onset  . Hypertension Mother   . Diabetes Mother   . Cancer Mother     breast cancer   . Hypertension Father   . Heart disease Father   . Stroke Father    . Hypertension Sister    Social History  Substance Use Topics  . Smoking status: Current Every Day Smoker -- 0.25 packs/day    Types: Cigarettes  . Smokeless tobacco: Never Used  . Alcohol Use: 0.0 oz/week    0 Standard drinks or equivalent per week     Comment: occassional    Review of Systems  Neurological: Positive for weakness.  All other systems reviewed and are negative.     Allergies  Review of patient's allergies indicates no known allergies.  Home Medications   Prior to Admission medications   Medication Sig Start Date End Date Taking? Authorizing Provider  amLODipine (NORVASC) 5 MG tablet Take 1 tablet (5 mg total) by mouth daily. Patient not taking: Reported on 12/17/2015 10/12/15   Maren Reamer, MD  atorvastatin (LIPITOR) 40 MG tablet Take 1 tablet (40 mg total) by mouth daily. 11/29/15   Josalyn Funches, MD  gabapentin (NEURONTIN) 300 MG capsule Take 1 capsule (300 mg total) by mouth 3 (three) times daily. Needs office visit for refills 09/02/15   Boykin Nearing, MD  glipiZIDE (GLUCOTROL) 10 MG tablet Take 1 tablet (10 mg total) by mouth 2 (two) times daily before a meal. 11/29/15   Boykin Nearing, MD  glucose monitoring kit (FREESTYLE) monitoring kit 1  each by Does not apply route as needed for other. Dispense any model that is covered- dispense testing supplies for Q AC/ HS accuchecks- 1 month supply with one refil. 05/22/14   Lorayne Marek, MD  lisinopril (PRINIVIL,ZESTRIL) 40 MG tablet Take 0.5 tablets (20 mg total) by mouth daily. 11/29/15   Boykin Nearing, MD  metFORMIN (GLUCOPHAGE) 1000 MG tablet Take 1 tablet (1,000 mg total) by mouth 2 (two) times daily with a meal. Resume on 04/29/15. 11/29/15   Josalyn Funches, MD   BP 139/61 mmHg  Pulse 76  Resp 14  SpO2 97% Physical Exam  Constitutional: He is oriented to person, place, and time. He appears well-developed and well-nourished.  HENT:  Head: Normocephalic and atraumatic.  Right Ear: External ear normal.   Left Ear: External ear normal.  Nose: Nose normal.  Mouth/Throat: Oropharynx is clear and moist.  Eyes: Conjunctivae and EOM are normal. Pupils are equal, round, and reactive to light.  Neck: Normal range of motion. Neck supple.  Cardiovascular: Normal rate, regular rhythm, normal heart sounds and intact distal pulses.   Pulmonary/Chest: Effort normal and breath sounds normal.  Abdominal: Soft. Bowel sounds are normal.  Musculoskeletal: Normal range of motion.  Neurological: He is alert and oriented to person, place, and time.  LEFT ARM AND LEG WEAKNESS.  NO FACIAL ASYMMETRY OR PROBLEMS WITH FACIAL MVMT.  SPEECH IS NL.  Skin: Skin is warm and dry.  Psychiatric: He has a normal mood and affect. His behavior is normal. Judgment and thought content normal.  Nursing note and vitals reviewed.   ED Course  Procedures (including critical care time) Labs Review Labs Reviewed  COMPREHENSIVE METABOLIC PANEL - Abnormal; Notable for the following:    Glucose, Bld 109 (*)    ALT 15 (*)    All other components within normal limits  CBC WITH DIFFERENTIAL/PLATELET - Abnormal; Notable for the following:    Monocytes Absolute 1.2 (*)    All other components within normal limits  TROPONIN I - Abnormal; Notable for the following:    Troponin I 0.03 (*)    All other components within normal limits  URINALYSIS, ROUTINE W REFLEX MICROSCOPIC (NOT AT [facility]) - Abnormal; Notable for the following:    Hgb urine dipstick TRACE (*)    Protein, ur 30 (*)    All other components within normal limits  URINE RAPID DRUG SCREEN, HOSP PERFORMED - Abnormal; Notable for the following:    Cocaine POSITIVE (*)    All other components within normal limits  URINE MICROSCOPIC-ADD ON  ETHANOL    Imaging Review Dg Chest 2 View  01/23/2016  CLINICAL DATA:  Left arm and leg weakness. EXAM: CHEST  2 VIEW COMPARISON:  Radiograph 10/03/2015 FINDINGS: Stable cardiomegaly and mediastinal contours. Mild atherosclerosis of the  aortic arch. Mild vascular congestion without alveolar edema. No confluent airspace disease. No pleural effusion or pneumothorax. Osseous structures are intact. IMPRESSION: 1. Stable mild cardiomegaly and vascular congestion. 2. Minimal thoracic aortic atherosclerosis. Electronically Signed   By: Jeb Levering M.D.   On: 01/23/2016 19:36   Ct Head Wo Contrast  01/23/2016  CLINICAL DATA:  Left arm and leg weakness due to spinal cord contusion. EXAM: CT HEAD WITHOUT CONTRAST TECHNIQUE: Contiguous axial images were obtained from the base of the skull through the vertex without intravenous contrast. COMPARISON:  12/17/2015 FINDINGS: Examination demonstrates absence of the septum pellucidum than small corpus callosum unchanged. Remaining cisterns and CSF spaces are within normal. There is no mass,  mass effect, shift of midline structures or acute hemorrhage. There is no evidence to suggest acute infarction. Bony structures are within normal. Multiple stable scalp masses likely dermatologic again likely representing sebaceous/epidermal inclusion cysts. IMPRESSION: No acute intracranial findings. Electronically Signed   By: Marin Olp M.D.   On: 01/23/2016 19:43   I have personally reviewed and evaluated these images and lab results as part of my medical decision-making.   EKG Interpretation   Date/Time:  Sunday January 23 2016 19:18:02 EDT Ventricular Rate:  82 PR Interval:    QRS Duration: 118 QT Interval:  404 QTC Calculation: 472 R Axis:   -61 Text Interpretation:  Sinus rhythm Left anterior fascicular block Left  ventricular hypertrophy Borderline T abnormalities, inferior leads  Anterior ST elevation, probably due to LVH No significant change since  last tracing Confirmed by [facility] MD, Tanay Massiah ([id number]) on 01/23/2016 7:30:34  PM      MDM  PT'S SX SEEM VERY CHRONIC.  I DON'T THINK THERE IS ANY OTHER STUDY NEEDED IN THE ED.  I SPOKE WITH PT ABOUT HIS COCAINE ABUSE.  I ENCOURAGED HIM NOT TO USE  COCAINE.  THE PT IS ENCOURAGED TO F/U W/ DR. YAN AND WITH PCP.  Final diagnoses:  Cocaine abuse  Left-sided weakness       Isla Pence, MD 01/23/16 2134

## 2016-01-26 ENCOUNTER — Encounter: Payer: Self-pay | Admitting: Podiatry

## 2016-01-26 ENCOUNTER — Ambulatory Visit (INDEPENDENT_AMBULATORY_CARE_PROVIDER_SITE_OTHER): Payer: Medicaid Other | Admitting: Podiatry

## 2016-01-26 DIAGNOSIS — B351 Tinea unguium: Secondary | ICD-10-CM | POA: Diagnosis not present

## 2016-01-26 DIAGNOSIS — E119 Type 2 diabetes mellitus without complications: Secondary | ICD-10-CM

## 2016-01-26 DIAGNOSIS — L84 Corns and callosities: Secondary | ICD-10-CM

## 2016-01-26 DIAGNOSIS — M79676 Pain in unspecified toe(s): Secondary | ICD-10-CM

## 2016-01-26 NOTE — Progress Notes (Signed)
Patient ID: [name], male   DOB: [birth year], 55 y.o.   MRN: [id number]  Subjective: This patient presents for a scheduled visit complaining of thick and elongated toenails which are uncomfortable walking wearing shoes as well as a painful callus on the plantar aspect of the left hallux. He is requesting debridement of the nails as well as a callus. He admits that he does not wear shoes at all times at home Patient's cousin present in treatment room today  Objective: No open skin lesions bilaterally The toenails are elongated, incurvated, discolored, deformed, hypertrophic and tender to direct palpation 6-10 Large bleeding callus sub-left hallux that remains closed after debridement Poor hygiene plantar left foot  Assessment: Symptomatic onychomycoses 6-10 Pre-ulcerative plantar callus left hallux Diabetic type 2 without complications  Plan: Debridement toenails 6-10 mechanically and electrically. Without any bleeding  Debrided plantar callus left hallux without any bleeding Advised patient to wear shoes at all times  Reappoint 3 months

## 2016-01-26 NOTE — Patient Instructions (Signed)
Diabetes and Foot Care Diabetes may cause you to have problems because of poor blood supply (circulation) to your feet and legs. This may cause the skin on your feet to become thinner, break easier, and heal more slowly. Your skin may become dry, and the skin may peel and crack. You may also have nerve damage in your legs and feet causing decreased feeling in them. You may not notice minor injuries to your feet that could lead to infections or more serious problems. Taking care of your feet is one of the most important things you can do for yourself.  HOME CARE INSTRUCTIONS  Wear shoes at all times, even in the house. Do not go barefoot. Bare feet are easily injured.  Check your feet daily for blisters, cuts, and redness. If you cannot see the bottom of your feet, use a mirror or ask someone for help.  Wash your feet with warm water (do not use hot water) and mild soap. Then pat your feet and the areas between your toes until they are completely dry. Do not soak your feet as this can dry your skin.  Apply a moisturizing lotion or petroleum jelly (that does not contain alcohol and is unscented) to the skin on your feet and to dry, brittle toenails. Do not apply lotion between your toes.  Trim your toenails straight across. Do not dig under them or around the cuticle. File the edges of your nails with an emery board or nail file.  Do not cut corns or calluses or try to remove them with medicine.  Wear clean socks or stockings every day. Make sure they are not too tight. Do not wear knee-high stockings since they may decrease blood flow to your legs.  Wear shoes that fit properly and have enough cushioning. To break in new shoes, wear them for just a few hours a day. This prevents you from injuring your feet. Always look in your shoes before you put them on to be sure there are no objects inside.  Do not cross your legs. This may decrease the blood flow to your feet.  If you find a minor scrape,  cut, or break in the skin on your feet, keep it and the skin around it clean and dry. These areas may be cleansed with mild soap and water. Do not cleanse the area with peroxide, alcohol, or iodine.  When you remove an adhesive bandage, be sure not to damage the skin around it.  If you have a wound, look at it several times a day to make sure it is healing.  Do not use heating pads or hot water bottles. They may burn your skin. If you have lost feeling in your feet or legs, you may not know it is happening until it is too late.  Make sure your health care provider performs a complete foot exam at least annually or more often if you have foot problems. Report any cuts, sores, or bruises to your health care provider immediately. SEEK MEDICAL CARE IF:   You have an injury that is not healing.  You have cuts or breaks in the skin.  You have an ingrown nail.  You notice redness on your legs or feet.  You feel burning or tingling in your legs or feet.  You have pain or cramps in your legs and feet.  Your legs or feet are numb.  Your feet always feel cold. SEEK IMMEDIATE MEDICAL CARE IF:   There is increasing redness,   swelling, or pain in or around a wound.  There is a red line that goes up your leg.  Pus is coming from a wound.  You develop a fever or as directed by your health care provider.  You notice a bad smell coming from an ulcer or wound.   This information is not intended to replace advice given to you by your health care provider. Make sure you discuss any questions you have with your health care provider.   Document Released: 07/07/2000 Document Revised: 03/12/2013 Document Reviewed: 12/17/2012 Elsevier Interactive Patient Education 2016 [employer]  

## 2016-01-28 ENCOUNTER — Ambulatory Visit: Payer: Medicaid Other | Attending: Family Medicine | Admitting: Family Medicine

## 2016-01-28 ENCOUNTER — Encounter: Payer: Self-pay | Admitting: Family Medicine

## 2016-01-28 ENCOUNTER — Telehealth: Payer: Self-pay | Admitting: Family Medicine

## 2016-01-28 VITALS — BP 141/83 | HR 65 | Temp 98.3°F | Resp 20 | Ht 75.0 in | Wt 267.6 lb

## 2016-01-28 DIAGNOSIS — E119 Type 2 diabetes mellitus without complications: Secondary | ICD-10-CM | POA: Diagnosis not present

## 2016-01-28 DIAGNOSIS — Z7984 Long term (current) use of oral hypoglycemic drugs: Secondary | ICD-10-CM | POA: Diagnosis not present

## 2016-01-28 DIAGNOSIS — F1410 Cocaine abuse, uncomplicated: Secondary | ICD-10-CM | POA: Diagnosis not present

## 2016-01-28 DIAGNOSIS — I10 Essential (primary) hypertension: Secondary | ICD-10-CM | POA: Diagnosis not present

## 2016-01-28 DIAGNOSIS — R809 Proteinuria, unspecified: Secondary | ICD-10-CM

## 2016-01-28 DIAGNOSIS — E1169 Type 2 diabetes mellitus with other specified complication: Secondary | ICD-10-CM | POA: Insufficient documentation

## 2016-01-28 DIAGNOSIS — Z79899 Other long term (current) drug therapy: Secondary | ICD-10-CM | POA: Insufficient documentation

## 2016-01-28 DIAGNOSIS — L739 Follicular disorder, unspecified: Secondary | ICD-10-CM | POA: Diagnosis not present

## 2016-01-28 DIAGNOSIS — IMO0001 Reserved for inherently not codable concepts without codable children: Secondary | ICD-10-CM

## 2016-01-28 LAB — POCT GLYCOSYLATED HEMOGLOBIN (HGB A1C): Hemoglobin A1C: 6.8

## 2016-01-28 LAB — GLUCOSE, POCT (MANUAL RESULT ENTRY)
POC GLUCOSE: 127 mg/dL — AB (ref 70–99)
POC GLUCOSE: 57 mg/dL — AB (ref 70–99)
POC Glucose: 61 mg/dl — AB (ref 70–99)

## 2016-01-28 MED ORDER — GABAPENTIN 300 MG PO CAPS: 300.0000 mg | ORAL_CAPSULE | Freq: Three times a day (TID) | ORAL | Status: DC

## 2016-01-28 MED ORDER — GLIPIZIDE ER 10 MG PO TB24: 10.0000 mg | ORAL_TABLET | Freq: Every day | ORAL | Status: DC

## 2016-01-28 MED ORDER — ATORVASTATIN CALCIUM 40 MG PO TABS: 40.0000 mg | ORAL_TABLET | Freq: Every day | ORAL | Status: DC

## 2016-01-28 MED ORDER — LISINOPRIL 40 MG PO TABS: 20.0000 mg | ORAL_TABLET | Freq: Every day | ORAL | Status: DC

## 2016-01-28 MED ORDER — SULFAMETHOXAZOLE-TRIMETHOPRIM 800-160 MG PO TABS: 1.0000 | ORAL_TABLET | Freq: Every day | ORAL | Status: DC

## 2016-01-28 MED ORDER — METFORMIN HCL 1000 MG PO TABS: 1000.0000 mg | ORAL_TABLET | Freq: Two times a day (BID) | ORAL | Status: DC

## 2016-01-28 MED ORDER — AMLODIPINE BESYLATE 5 MG PO TABS: 5.0000 mg | ORAL_TABLET | Freq: Every day | ORAL | Status: DC

## 2016-01-28 NOTE — Telephone Encounter (Signed)
Patient counseled to quit cocaine

## 2016-01-28 NOTE — Assessment & Plan Note (Signed)
Diabetes controlled with low sugars Decrease glipizide from 10 mg BID to 10 mg XL once daily Continue metformin 1000 mg BID

## 2016-01-28 NOTE — Progress Notes (Signed)
Right side of head swelling Groggy today- took a gabapentin earlier Patient noted in be in the ED and was using cocaine

## 2016-01-28 NOTE — Patient Instructions (Addendum)
[name] was seen today for diabetes.  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 -     metFORMIN (GLUCOPHAGE) 1000 MG tablet; Take 1 tablet (1,000 mg total) by mouth 2 (two) times daily with a meal. -     gabapentin (NEURONTIN) 300 MG capsule; Take 1 capsule (300 mg total) by mouth 3 (three) times daily. -     atorvastatin (LIPITOR) 40 MG tablet; Take 1 tablet (40 mg total) by mouth daily.  Essential hypertension -     amLODipine (NORVASC) 5 MG tablet; Take 1 tablet (5 mg total) by mouth daily. -     lisinopril (PRINIVIL,ZESTRIL) 40 MG tablet; Take 0.5 tablets (20 mg total) by mouth daily.  Folliculitis -     sulfamethoxazole-trimethoprim (BACTRIM DS,SEPTRA DS) 800-160 MG tablet; Take 1 tablet by mouth daily.   Stop using cocaine. It will increase your risk of  heart disease and stroke. Substance abuse resources provided.   F/u in 3 months for HTN and diabetes   Dr. Adrian Blackwater

## 2016-01-28 NOTE — Progress Notes (Signed)
Subjective:  Patient ID: [name], male    DOB: Jun 09, 1961  Age: 55 y.o. MRN: [id number]  CC: Diabetes   HPI Levoy Devonne Doughty has DM2, HTN, hx of CVA, scalp folliculitis he presents for   1. DM2: low CBGs < 70 at times. Taking metformin 1000 mg daily and glipizide 10 mg BID. Feels groggy when blood sugar is low  2. HTN: taking lisinoril 40 mg daily. Not taking norvasc. No hA, CP or SOB.  3. Folliculitis: recurrent swelling in scalp with drainage of purulent discharge. Tender in spots. No fever. 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   4. Cocaine abuse: admits to using. He reports willingness to quit. He knows that cocaine increases risk of heart attack and stroke.   Social History  Substance Use Topics  . Smoking status: Current Every Day Smoker -- 0.25 packs/day    Types: Cigarettes  . Smokeless tobacco: Never Used  . Alcohol Use: 1.2 - 1.8 oz/week    0 Standard drinks or equivalent, 2-3 Cans of beer per week     Comment: occassional    Outpatient Prescriptions Prior to Visit  Medication Sig Dispense Refill  . atorvastatin (LIPITOR) 40 MG tablet Take 1 tablet (40 mg total) by mouth daily. 30 tablet 5  . gabapentin (NEURONTIN) 300 MG capsule Take 1 capsule (300 mg total) by mouth 3 (three) times daily. Needs office visit for refills 90 capsule 0  . glipiZIDE (GLUCOTROL) 10 MG tablet Take 1 tablet (10 mg total) by mouth 2 (two) times daily before a meal. 60 tablet 3  . glucose monitoring kit (FREESTYLE) monitoring kit 1 each by Does not apply route as needed for other. Dispense any model that is covered- dispense testing supplies for Q AC/ HS accuchecks- 1 month supply with one refil. 1 each 1  . lisinopril (PRINIVIL,ZESTRIL) 40 MG tablet Take 0.5 tablets (20 mg total) by mouth daily. 30 tablet 5  . metFORMIN (GLUCOPHAGE) 1000 MG tablet Take 1 tablet (1,000 mg total) by mouth 2 (two) times daily with a meal. Resume on 04/29/15. 60 tablet 11  . amLODipine (NORVASC) 5 MG tablet Take 1 tablet (5  mg total) by mouth daily. (Patient not taking: Reported on 12/17/2015) 90 tablet 3   No facility-administered medications prior to visit.    ROS Review of Systems  Constitutional: Negative for fever, chills, fatigue and unexpected weight change.  Eyes: Negative for visual disturbance.  Respiratory: Negative for cough and shortness of breath.   Cardiovascular: Negative for chest pain, palpitations and leg swelling.  Gastrointestinal: Negative for nausea, vomiting, abdominal pain, diarrhea, constipation and blood in stool.  Endocrine: Negative for polydipsia, polyphagia and polyuria.  Musculoskeletal: Negative for myalgias, back pain, arthralgias, gait problem and neck pain.  Skin: Positive for wound. Negative for rash.  Allergic/Immunologic: Negative for immunocompromised state.  Neurological: Positive for weakness (L side since stroke ).  Hematological: Negative for adenopathy. Does not bruise/bleed easily.  Psychiatric/Behavioral: Negative for suicidal ideas, sleep disturbance and dysphoric mood. The patient is not nervous/anxious.     Objective:  BP 141/83 mmHg  Pulse 65  Temp(Src) 98.3 F (36.8 C) (Oral)  Resp 20  Ht 6' 3" (1.905 m)  Wt 267 lb 9.6 oz (121.383 kg)  BMI 33.45 kg/m2  SpO2 97%  BP/Weight 01/28/2016 01/23/2016 1/97/5883  Systolic BP 254 982 641  Diastolic BP 83 64 73  Wt. (Lbs) 267.6 - 267  BMI 33.45 - 33.37   Physical Exam  Constitutional: He  appears well-developed and well-nourished. No distress.  HENT:  Head: Normocephalic and atraumatic.    Neck: Normal range of motion. Neck supple.  Cardiovascular: Normal rate, regular rhythm, normal heart sounds and intact distal pulses.   Pulmonary/Chest: Effort normal and breath sounds normal.  Musculoskeletal: He exhibits edema (trace LE ).       Feet:  Neurological: He is alert.  Skin: Skin is warm and dry. No rash noted. No erythema.  Psychiatric: He has a normal mood and affect.    Lab Results  Component  Value Date   HGBA1C 6.8 01/28/2016   CBG 61   Treated with crackers   Repeat CBG 57  Treated with more cracker and coca cola  Repeat CBG 127  Assessment & Plan:   Davied was seen today for diabetes.  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 -     metFORMIN (GLUCOPHAGE) 1000 MG tablet; Take 1 tablet (1,000 mg total) by mouth 2 (two) times daily with a meal. -     gabapentin (NEURONTIN) 300 MG capsule; Take 1 capsule (300 mg total) by mouth 3 (three) times daily. -     atorvastatin (LIPITOR) 40 MG tablet; Take 1 tablet (40 mg total) by mouth daily. -     Glucose (CBG) -     Glucose (CBG)  Essential hypertension -     amLODipine (NORVASC) 5 MG tablet; Take 1 tablet (5 mg total) by mouth daily. -     lisinopril (PRINIVIL,ZESTRIL) 40 MG tablet; Take 0.5 tablets (20 mg total) by mouth daily.  Folliculitis -     sulfamethoxazole-trimethoprim (BACTRIM DS,SEPTRA DS) 800-160 MG tablet; Take 1 tablet by mouth daily.  Cocaine abuse   No orders of the defined types were placed in this encounter.    Follow-up: Return in about 3 months (around 04/29/2016) for HTN, DM2 .   Boykin Nearing MD

## 2016-01-28 NOTE — Assessment & Plan Note (Signed)
Long term abuse Counseled patient to quit in order to reduce risk of MI and stroke Substance abuse resources provided

## 2016-01-28 NOTE — Assessment & Plan Note (Signed)
A: HTN above goal in setting of cocaine abuse P: Continue lisinopril 40 mg daily Restart norvasc 5 mg daily

## 2016-01-28 NOTE — Assessment & Plan Note (Signed)
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 Treated with doxycyline last visit  Plan Bactrim x 10 days

## 2016-01-28 NOTE — Telephone Encounter (Signed)
Dorothy from [facility] called requesting for PCP to speak with the pt. On his drug abuse. Rep would like for PCP to  Talk to his about the consequences of using cocaine. Please f/u

## 2016-02-02 ENCOUNTER — Telehealth: Payer: Self-pay | Admitting: *Deleted

## 2016-02-02 NOTE — Telephone Encounter (Signed)
MA spoke with Nurse from [facility]. MA clarified with nurse from patients last OV with Funches. Nurse states pharmacy has sent the patient non extended release of Glipizide and 20mg  tablets instead of 40. Nurse states 40mg  tablets are too small for patient to cut. MA informed nurse of Glipizide being ordered as XL 24hr release. MA also clarified the patient should be taking 20mg  tablet of Lisinopril. No further questions at this time.

## 2016-02-28 ENCOUNTER — Ambulatory Visit: Payer: Medicaid Other | Attending: Family Medicine | Admitting: Family Medicine

## 2016-02-28 ENCOUNTER — Encounter: Payer: Self-pay | Admitting: Family Medicine

## 2016-02-28 VITALS — BP 167/84 | HR 64 | Temp 97.5°F | Ht 72.0 in | Wt 267.8 lb

## 2016-02-28 DIAGNOSIS — L723 Sebaceous cyst: Secondary | ICD-10-CM | POA: Insufficient documentation

## 2016-02-28 DIAGNOSIS — G609 Hereditary and idiopathic neuropathy, unspecified: Secondary | ICD-10-CM | POA: Insufficient documentation

## 2016-02-28 DIAGNOSIS — R109 Unspecified abdominal pain: Secondary | ICD-10-CM

## 2016-02-28 DIAGNOSIS — M4806 Spinal stenosis, lumbar region: Secondary | ICD-10-CM

## 2016-02-28 DIAGNOSIS — M48061 Spinal stenosis, lumbar region without neurogenic claudication: Secondary | ICD-10-CM

## 2016-02-28 LAB — VITAMIN B12: Vitamin B-12: 725 pg/mL (ref 200–1100)

## 2016-02-28 MED ORDER — PREDNISONE 20 MG PO TABS: ORAL_TABLET | ORAL | 0 refills | Status: DC

## 2016-02-28 NOTE — Patient Instructions (Signed)
Moishy was seen today for hair/scalp problem and numbness.  Diagnoses and all orders for this visit:  Left flank pain -     predniSONE (DELTASONE) 20 MG tablet; Take every morning with food 60 mg daily for 3 days, 40 mg daily for 3 days, 30 mg daily for 3 days, 20 mg daily for 3 days, 10 mg daily for 3 days then STOP -     DG HIP UNILAT W OR W/O PELVIS 2-3 VIEWS LEFT; Future  Spinal stenosis of lumbar region -     predniSONE (DELTASONE) 20 MG tablet; Take every morning with food 60 mg daily for 3 days, 40 mg daily for 3 days, 30 mg daily for 3 days, 20 mg daily for 3 days, 10 mg daily for 3 days then STOP  Sebaceous cyst -     Ambulatory referral to Dermatology  Idiopathic peripheral neuropathy (HCC) -     Vitamin B12    F/u in 4 weeks for L sided pain and weakness   Dr. Adrian Blackwater

## 2016-02-28 NOTE — Progress Notes (Signed)
Subjective:  Patient ID: [name], male    DOB: July 22, 1961  Age: 55 y.o. 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 Recurrent folliculitis: swelling and drainage from lesions on scalp. No fever or chills. Large papule on R posterior scalp causes the most pain and swelling. Bactrim and clindamycin have not helped with pain.   2. Foot numbness: L foot. He has well controlled diabetes. He continues to drink alcohol. He continues to use cocaine. Last use was one week ago.   3. L side pain: pain in L hip and flank worsening over the past 3 weeks. Associated with weakness. He went to ED on  01/23/16 for L sided weakness. CT head was negative for acute stroke.   Social History  Substance Use Topics  . Smoking status: Current Every Day Smoker    Packs/day: 0.25    Types: Cigarettes  . Smokeless tobacco: Never Used  . Alcohol use 1.2 - 1.8 oz/week    2 - 3 Cans of beer per week     Comment: occassional   Outpatient Medications Prior to Visit  Medication Sig Dispense Refill  . amLODipine (NORVASC) 5 MG tablet Take 1 tablet (5 mg total) by mouth daily. 90 tablet 3  . atorvastatin (LIPITOR) 40 MG tablet Take 1 tablet (40 mg total) by mouth daily. 90 tablet 3  . gabapentin (NEURONTIN) 300 MG capsule Take 1 capsule (300 mg total) by mouth 3 (three) times daily. 270 capsule 1  . glipiZIDE (GLIPIZIDE XL) 10 MG 24 hr tablet Take 1 tablet (10 mg total) by mouth daily with breakfast. 90 tablet 3  . glucose monitoring kit (FREESTYLE) monitoring kit 1 each by Does not apply route as needed for other. Dispense any model that is covered- dispense testing supplies for Q AC/ HS accuchecks- 1 month supply with one refil. 1 each 1  . lisinopril (PRINIVIL,ZESTRIL) 40 MG tablet Take 0.5 tablets (20 mg total) by mouth daily. 90 tablet 3  . metFORMIN  (GLUCOPHAGE) 1000 MG tablet Take 1 tablet (1,000 mg total) by mouth 2 (two) times daily with a meal. 180 tablet 3  . sulfamethoxazole-trimethoprim (BACTRIM DS,SEPTRA DS) 800-160 MG tablet Take 1 tablet by mouth daily. (Patient not taking: Reported on 02/28/2016) 10 tablet 0   No facility-administered medications prior to visit.     ROS Review of Systems  Constitutional: Negative for chills, fatigue, fever and unexpected weight change.  Eyes: Negative for visual disturbance.  Respiratory: Negative for cough and shortness of breath.   Cardiovascular: Negative for chest pain, palpitations and leg swelling.  Gastrointestinal: Negative for abdominal pain, blood in stool, constipation, diarrhea, nausea and vomiting.  Endocrine: Negative for polydipsia, polyphagia and polyuria.  Musculoskeletal: Negative for arthralgias, back pain, gait problem, myalgias and neck pain.  Skin: Positive for wound. Negative for rash.  Allergic/Immunologic: Negative for immunocompromised state.  Neurological: Positive for weakness (on L side since stroke ) and numbness (in Feet L >R).  Hematological: Negative for adenopathy. Does not bruise/bleed easily.  Psychiatric/Behavioral: Negative for dysphoric mood, sleep disturbance and suicidal ideas. The patient is not nervous/anxious.     Objective:  BP (!) 167/84 (BP Location: Right Arm, Patient Position: Sitting, Cuff Size: Large)   Pulse 64   Temp 97.5 F (36.4 C) (Oral)   Ht 6' (1.829 m)  Wt 267 lb 12.8 oz (121.5 kg)   SpO2 100%   BMI 36.32 kg/m   BP/Weight 02/28/2016 08/31/3660 03/27/7653  Systolic BP 650 354 656  Diastolic BP 84 83 64  Wt. (Lbs) 267.8 267.6 -  BMI 36.32 33.45 -      Physical Exam  Constitutional: He appears well-developed and well-nourished. No distress.  HENT:  Head: Normocephalic and atraumatic.    Neck: Normal range of motion. Neck supple.  Cardiovascular: Normal rate, regular rhythm, normal heart sounds and intact distal pulses.     Pulmonary/Chest: Effort normal and breath sounds normal.  Abdominal: Soft. Bowel sounds are normal. He exhibits no distension and no mass. There is tenderness. There is no rebound and no guarding.    Musculoskeletal: He exhibits edema (trace LE ).       Left hip: Normal.       Feet:  Neurological: He is alert.  Skin: Skin is warm and dry. No rash noted. No erythema.  Psychiatric: He has a normal mood and affect.   Incision and Drainage Procedure Note  Pre-operative Diagnosis: folliculitis    Post-operative Diagnosis: sebaceous cyst   Indications: relief of pain and swelling   Anesthesia: not needed, cyst already draining   Procedure Details  The procedure, risks and complications have been discussed in detail (including, but not limited to airway compromise, infection, bleeding) with the patient, and the patient has signed consent to the procedure.  The skin was sterilely prepped and draped over the affected area in the usual fashion. After adequate local anesthesia, I&D with a #11 blade was performed on the right posterior scalp. Purulent drainage: absent. Sebaceous fluid expressed from cyst.   The patient was observed until stable.  Findings: Sebaceous cyst   EBL: 3 cc's  Drains: none   Condition: Tolerated procedure well   Complications: bleeding.   Assessment & Plan:  Aayan was seen today for hair/scalp problem and numbness.  Diagnoses and all orders for this visit:  Left flank pain -     predniSONE (DELTASONE) 20 MG tablet; Take every morning with food 60 mg daily for 3 days, 40 mg daily for 3 days, 30 mg daily for 3 days, 20 mg daily for 3 days, 10 mg daily for 3 days then STOP -     DG HIP UNILAT W OR W/O PELVIS 2-3 VIEWS LEFT; Future  Spinal stenosis of lumbar region -     predniSONE (DELTASONE) 20 MG tablet; Take every morning with food 60 mg daily for 3 days, 40 mg daily for 3 days, 30 mg daily for 3 days, 20 mg daily for 3 days, 10 mg daily for 3  days then STOP  Sebaceous cyst -     Ambulatory referral to Dermatology  Idiopathic peripheral neuropathy ([place]) -     Vitamin B12   There are no diagnoses linked to this encounter.  No orders of the defined types were placed in this encounter.   Follow-up: No Follow-up on file.   Boykin Nearing MD

## 2016-02-28 NOTE — Progress Notes (Signed)
C/C patient states that he has knots on his head. Left side is hurting .

## 2016-02-29 DIAGNOSIS — R109 Unspecified abdominal pain: Secondary | ICD-10-CM | POA: Insufficient documentation

## 2016-02-29 DIAGNOSIS — R10A2 Flank pain, left side: Secondary | ICD-10-CM | POA: Insufficient documentation

## 2016-02-29 NOTE — Assessment & Plan Note (Signed)
Sebaceous cyst on scalp  Dermatology referral placed

## 2016-02-29 NOTE — Assessment & Plan Note (Signed)
Left flank pain and hip pain, known spinal stenosis in lumbar region  DG hip Prednisone

## 2016-03-23 ENCOUNTER — Ambulatory Visit: Payer: Medicaid Other | Attending: Family Medicine | Admitting: Family Medicine

## 2016-03-23 ENCOUNTER — Encounter: Payer: Self-pay | Admitting: Family Medicine

## 2016-03-23 VITALS — BP 162/91 | HR 71 | Temp 98.7°F | Ht 72.0 in | Wt 263.4 lb

## 2016-03-23 DIAGNOSIS — Z7984 Long term (current) use of oral hypoglycemic drugs: Secondary | ICD-10-CM | POA: Diagnosis not present

## 2016-03-23 DIAGNOSIS — Z0000 Encounter for general adult medical examination without abnormal findings: Secondary | ICD-10-CM | POA: Diagnosis not present

## 2016-03-23 DIAGNOSIS — M7989 Other specified soft tissue disorders: Secondary | ICD-10-CM | POA: Insufficient documentation

## 2016-03-23 DIAGNOSIS — Z79899 Other long term (current) drug therapy: Secondary | ICD-10-CM | POA: Diagnosis not present

## 2016-03-23 DIAGNOSIS — I10 Essential (primary) hypertension: Secondary | ICD-10-CM | POA: Insufficient documentation

## 2016-03-23 DIAGNOSIS — R7989 Other specified abnormal findings of blood chemistry: Secondary | ICD-10-CM

## 2016-03-23 DIAGNOSIS — M48061 Spinal stenosis, lumbar region without neurogenic claudication: Secondary | ICD-10-CM

## 2016-03-23 DIAGNOSIS — E119 Type 2 diabetes mellitus without complications: Secondary | ICD-10-CM | POA: Insufficient documentation

## 2016-03-23 DIAGNOSIS — M4806 Spinal stenosis, lumbar region: Secondary | ICD-10-CM | POA: Insufficient documentation

## 2016-03-23 DIAGNOSIS — F17210 Nicotine dependence, cigarettes, uncomplicated: Secondary | ICD-10-CM | POA: Insufficient documentation

## 2016-03-23 DIAGNOSIS — R791 Abnormal coagulation profile: Secondary | ICD-10-CM

## 2016-03-23 LAB — GLUCOSE, POCT (MANUAL RESULT ENTRY): POC Glucose: 119 mg/dl — AB (ref 70–99)

## 2016-03-23 LAB — D-DIMER, QUANTITATIVE (NOT AT ARMC): D DIMER QUANT: 0.75 ug{FEU}/mL — AB (ref <0.50)

## 2016-03-23 MED ORDER — LISINOPRIL 40 MG PO TABS: 40.0000 mg | ORAL_TABLET | Freq: Every day | ORAL | 3 refills | Status: DC

## 2016-03-23 MED ORDER — HYDROCHLOROTHIAZIDE 12.5 MG PO CAPS: 12.5000 mg | ORAL_CAPSULE | Freq: Every day | ORAL | 3 refills | Status: DC

## 2016-03-23 MED ORDER — MELOXICAM 7.5 MG PO TABS: 7.5000 mg | ORAL_TABLET | Freq: Every day | ORAL | 3 refills | Status: DC | PRN

## 2016-03-23 MED ORDER — MELOXICAM 7.5 MG PO TABS: 7.5000 mg | ORAL_TABLET | Freq: Every day | ORAL | 3 refills | Status: DC

## 2016-03-23 NOTE — Assessment & Plan Note (Signed)
Left leg swelling worsening since last OV with calf pain, suspect dependent edema in setting of HTN and L sided weakness, r/o DVT   Plan: D dimer Stop norvasc Add HCTZ Elevated leg

## 2016-03-23 NOTE — Assessment & Plan Note (Signed)
Slight improvement in L hip and flank pain following course of prednisone Add mobic 7.5 mg daily prn pain

## 2016-03-23 NOTE — Assessment & Plan Note (Signed)
A: HTN with elevated BP and edema in L foot P: Stop amlodipine Start HCTZ 12.5 mg daily Increase lisinopril to 40 mg daily

## 2016-03-23 NOTE — Progress Notes (Signed)
C/C: left side pain f/u Pain level is 3 on left side. 1

## 2016-03-23 NOTE — Patient Instructions (Addendum)
Romey was seen today for follow-up.  Diagnoses and all orders for this visit:  Type 2 diabetes mellitus without complication, without long-term current use of insulin (HCC) -     POCT glucose (manual entry)  Essential hypertension -     lisinopril (PRINIVIL,ZESTRIL) 40 MG tablet; Take 1 tablet (40 mg total) by mouth daily. -     hydrochlorothiazide (MICROZIDE) 12.5 MG capsule; Take 1 capsule (12.5 mg total) by mouth daily.  Spinal stenosis of lumbar region -     Discontinue: meloxicam (MOBIC) 7.5 MG tablet; Take 1 tablet (7.5 mg total) by mouth daily. -     meloxicam (MOBIC) 7.5 MG tablet; Take 1 tablet (7.5 mg total) by mouth daily as needed for pain.  Left leg swelling -     D-dimer, quantitative (not at [facility])   STOP amlodipine  F/u in 6 weeks for BP check with clinical pharmacologist  F/u with me in 3 months for HTN  Dr. Adrian Blackwater

## 2016-03-23 NOTE — Progress Notes (Signed)
Subjective:  Patient ID: [name], male    DOB: 03-29-1961  Age: 55 y.o. 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  L side pain: pain in L hip and flank worsening over the past 3 weeks. Associated with weakness. He went to ED on  01/23/16 for L sided weakness. CT head was negative for acute stroke. He saw me 3 weeks ago. He has L lower flank tenderness. He was treated with a course of prednisone. His vitamin B12 was normal. He has not completed L hip x-ray.   2. Left leg swelling: x 1 weeks. With pain in L calf. No injury. He sits a lot and does not elevate legs at rest. No cough, CP or SOB.   3. HTN: compliant with lisinopril 20 mg and norvasc 5 mg. Smokes. Using cocaine occasionally. No HA, CP or SOB.   Social History  Substance Use Topics  . Smoking status: Current Every Day Smoker    Packs/day: 0.25    Types: Cigarettes  . Smokeless tobacco: Never Used  . Alcohol use 1.2 - 1.8 oz/week    2 - 3 Cans of beer per week     Comment: occassional   Outpatient Medications Prior to Visit  Medication Sig Dispense Refill  . amLODipine (NORVASC) 5 MG tablet Take 1 tablet (5 mg total) by mouth daily. 90 tablet 3  . atorvastatin (LIPITOR) 40 MG tablet Take 1 tablet (40 mg total) by mouth daily. 90 tablet 3  . gabapentin (NEURONTIN) 300 MG capsule Take 1 capsule (300 mg total) by mouth 3 (three) times daily. 270 capsule 1  . glipiZIDE (GLIPIZIDE XL) 10 MG 24 hr tablet Take 1 tablet (10 mg total) by mouth daily with breakfast. 90 tablet 3  . glucose monitoring kit (FREESTYLE) monitoring kit 1 each by Does not apply route as needed for other. Dispense any model that is covered- dispense testing supplies for Q AC/ HS accuchecks- 1 month supply with one refil. 1 each 1  . lisinopril (PRINIVIL,ZESTRIL) 40 MG tablet Take 0.5 tablets (20 mg  total) by mouth daily. 90 tablet 3  . metFORMIN (GLUCOPHAGE) 1000 MG tablet Take 1 tablet (1,000 mg total) by mouth 2 (two) times daily with a meal. 180 tablet 3  . predniSONE (DELTASONE) 20 MG tablet Take every morning with food 60 mg daily for 3 days, 40 mg daily for 3 days, 30 mg daily for 3 days, 20 mg daily for 3 days, 10 mg daily for 3 days then STOP 24 tablet 0   No facility-administered medications prior to visit.     ROS Review of Systems  Constitutional: Negative for chills, fatigue, fever and unexpected weight change.  Eyes: Negative for visual disturbance.  Respiratory: Negative for cough and shortness of breath.   Cardiovascular: Negative for chest pain, palpitations and leg swelling.  Gastrointestinal: Negative for abdominal pain, blood in stool, constipation, diarrhea, nausea and vomiting.  Endocrine: Negative for polydipsia, polyphagia and polyuria.  Musculoskeletal: Negative for arthralgias, back pain, gait problem, myalgias and neck pain.  Skin: Positive for wound. Negative for rash.  Allergic/Immunologic: Negative for immunocompromised state.  Neurological: Positive for weakness (on L side since stroke ) and numbness (in Feet L >R).  Hematological: Negative for adenopathy. Does not bruise/bleed easily.  Psychiatric/Behavioral: Negative for dysphoric mood, sleep disturbance  and suicidal ideas. The patient is not nervous/anxious.     Objective:  BP (!) 162/91 (BP Location: Right Arm, Patient Position: Sitting, Cuff Size: Large)   Pulse 71   Temp 98.7 F (37.1 C) (Oral)   Ht 6' (1.829 m)   Wt 263 lb 6.4 oz (119.5 kg)   SpO2 97%   BMI 35.72 kg/m   BP/Weight 03/23/2016 02/28/2016 01/28/2016  Systolic BP 162 167 141  Diastolic BP 91 84 83  Wt. (Lbs) 263.4 267.8 267.6  BMI 35.72 36.32 33.45    Physical Exam  Constitutional: He appears well-developed and well-nourished. No distress.  HENT:  Head: Normocephalic and atraumatic.    Neck: Normal range of motion. Neck  supple.  Cardiovascular: Normal rate, regular rhythm, normal heart sounds and intact distal pulses.   Pulmonary/Chest: Effort normal and breath sounds normal.  Abdominal: Soft. Bowel sounds are normal. He exhibits no distension and no mass. There is tenderness. There is no rebound and no guarding.    Musculoskeletal: He exhibits edema (trace LE ).       Left hip: Normal.       Left lower leg: He exhibits tenderness and edema. He exhibits no bony tenderness, no swelling, no deformity and no laceration.       Legs:      Feet:  Neurological: He is alert.  Skin: Skin is warm and dry. No rash noted. No erythema.  Psychiatric: He has a normal mood and affect.   Lab Results  Component Value Date   HGBA1C 6.8 01/28/2016   CBG 119 Assessment & Plan:  [name] was seen today for follow-up.  Diagnoses and all orders for this visit:  Type 2 diabetes mellitus without complication, without long-term current use of insulin (HCC) -     POCT glucose (manual entry)   There are no diagnoses linked to this encounter.  No orders of the defined types were placed in this encounter.   Follow-up: No Follow-up on file.   Josalyn Funches MD   

## 2016-03-24 NOTE — Addendum Note (Signed)
Addended by: Boykin Nearing on: 03/24/2016 12:38 PM   Modules accepted: Orders

## 2016-03-28 ENCOUNTER — Ambulatory Visit (HOSPITAL_COMMUNITY): Payer: Medicaid Other

## 2016-04-18 ENCOUNTER — Telehealth: Payer: Self-pay

## 2016-04-18 ENCOUNTER — Other Ambulatory Visit: Payer: Self-pay

## 2016-04-18 DIAGNOSIS — M7989 Other specified soft tissue disorders: Secondary | ICD-10-CM

## 2016-04-18 DIAGNOSIS — R7989 Other specified abnormal findings of blood chemistry: Secondary | ICD-10-CM

## 2016-04-18 NOTE — Telephone Encounter (Signed)
Venous doppler re-ordered

## 2016-04-18 NOTE — Telephone Encounter (Signed)
-----   Message from Boykin Nearing, MD sent at 03/24/2016 12:37 PM EDT ----- D dimer is slightly elevated Plan left venous doppler ordered and needs to be scheduled

## 2016-04-18 NOTE — Telephone Encounter (Signed)
Patient hipaa verif per guidelines.  Rn advised patient of D Dimer results and need of venous doppler. Patient verbalized understanding

## 2016-04-18 NOTE — Addendum Note (Signed)
Addended by: Boykin Nearing on: 04/18/2016 12:56 PM   Modules accepted: Orders

## 2016-04-18 NOTE — Telephone Encounter (Signed)
Attempted to schedule Vascular ultrasound but [facility] rep could not see an order.  Please enter under "ancillary orders". Priscille Heidelberg, RN, BSN

## 2016-04-18 NOTE — Telephone Encounter (Signed)
appt for doppler scheduled for: 04/19/2016 at 10 am.  Return call to patient and patient advised of the above appointment at [facility].  Patient verbalized understanding.

## 2016-04-19 ENCOUNTER — Ambulatory Visit (HOSPITAL_COMMUNITY)
Admission: RE | Admit: 2016-04-19 | Discharge: 2016-04-19 | Disposition: A | Discharge status: Home / Self Care | Payer: Medicaid Other | Source: Ambulatory Visit | Attending: Family Medicine | Admitting: Family Medicine

## 2016-04-19 ENCOUNTER — Telehealth: Payer: Self-pay

## 2016-04-19 DIAGNOSIS — M7989 Other specified soft tissue disorders: Secondary | ICD-10-CM | POA: Diagnosis not present

## 2016-04-19 DIAGNOSIS — R791 Abnormal coagulation profile: Secondary | ICD-10-CM | POA: Diagnosis not present

## 2016-04-19 DIAGNOSIS — R7989 Other specified abnormal findings of blood chemistry: Secondary | ICD-10-CM

## 2016-04-19 NOTE — Progress Notes (Signed)
.  VASCULAR LAB PRELIMINARY  PRELIMINARY  PRELIMINARY  PRELIMINARY  Left lower extremity venous duplex completed.    Preliminary report:  Left:  No evidence of DVT, superficial thrombosis, or Baker's cyst.  Shirlie Enck, RVS 04/19/2016, 10:38 AM

## 2016-04-19 NOTE — Telephone Encounter (Signed)
[facility] called to inform you that [name] venous doppler was negative.

## 2016-04-20 NOTE — Telephone Encounter (Signed)
Noted. Please inform patient that LE venous doppler was negative

## 2016-04-21 NOTE — Telephone Encounter (Signed)
Patient hipaa verif; RN advised patient per Dr. Adrian Blackwater:  LE venous doppler was negative.  Patient verbalized understanding. Priscille Heidelberg, RN, BSN

## 2016-04-27 ENCOUNTER — Ambulatory Visit: Payer: Medicaid Other | Attending: Family Medicine | Admitting: Family Medicine

## 2016-04-27 ENCOUNTER — Encounter: Payer: Self-pay | Admitting: Family Medicine

## 2016-04-27 VITALS — BP 159/88 | HR 70 | Temp 98.6°F | Ht 72.0 in | Wt 268.0 lb

## 2016-04-27 DIAGNOSIS — E1169 Type 2 diabetes mellitus with other specified complication: Secondary | ICD-10-CM | POA: Diagnosis not present

## 2016-04-27 DIAGNOSIS — I10 Essential (primary) hypertension: Secondary | ICD-10-CM | POA: Insufficient documentation

## 2016-04-27 DIAGNOSIS — E785 Hyperlipidemia, unspecified: Secondary | ICD-10-CM | POA: Diagnosis not present

## 2016-04-27 DIAGNOSIS — Z79899 Other long term (current) drug therapy: Secondary | ICD-10-CM | POA: Insufficient documentation

## 2016-04-27 DIAGNOSIS — M546 Pain in thoracic spine: Secondary | ICD-10-CM | POA: Insufficient documentation

## 2016-04-27 DIAGNOSIS — M549 Dorsalgia, unspecified: Secondary | ICD-10-CM

## 2016-04-27 DIAGNOSIS — H6121 Impacted cerumen, right ear: Secondary | ICD-10-CM

## 2016-04-27 DIAGNOSIS — I69354 Hemiplegia and hemiparesis following cerebral infarction affecting left non-dominant side: Secondary | ICD-10-CM | POA: Insufficient documentation

## 2016-04-27 DIAGNOSIS — M79603 Pain in arm, unspecified: Secondary | ICD-10-CM | POA: Insufficient documentation

## 2016-04-27 DIAGNOSIS — Z7984 Long term (current) use of oral hypoglycemic drugs: Secondary | ICD-10-CM | POA: Diagnosis not present

## 2016-04-27 DIAGNOSIS — E119 Type 2 diabetes mellitus without complications: Secondary | ICD-10-CM | POA: Diagnosis not present

## 2016-04-27 DIAGNOSIS — F17210 Nicotine dependence, cigarettes, uncomplicated: Secondary | ICD-10-CM | POA: Diagnosis not present

## 2016-04-27 LAB — GLUCOSE, POCT (MANUAL RESULT ENTRY): POC GLUCOSE: 138 mg/dL — AB (ref 70–99)

## 2016-04-27 MED ORDER — CYCLOBENZAPRINE HCL 10 MG PO TABS: 10.0000 mg | ORAL_TABLET | Freq: Two times a day (BID) | ORAL | 0 refills | Status: AC | PRN

## 2016-04-27 NOTE — Progress Notes (Signed)
Pt is having shoulder blade pain(left side).  Flu complete.

## 2016-04-27 NOTE — Progress Notes (Signed)
 Subjective:  Patient ID: [name], male    DOB: 04/04/1961  Age: 55 y.o. 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  L upper back pain:  Started this AM. Woke up with pain in L upper back ar shoulder blade. No injury. No heavy lifting. No rash, CP, cough, SOB or fever. He admits to cocaine use. Last use was last week.  2. Decreased hearing in R ear: decreased hearing for one week. R ear. He tried cleaning his ears with no improvement. He denies ear pain, dizziness or fever.   Social History  Substance Use Topics  . Smoking status: Current Every Day Smoker    Packs/day: 0.25    Types: Cigarettes  . Smokeless tobacco: Never Used  . Alcohol use 1.2 - 1.8 oz/week    2 - 3 Cans of beer per week     Comment: occassional   Outpatient Medications Prior to Visit  Medication Sig Dispense Refill  . atorvastatin (LIPITOR) 40 MG tablet Take 1 tablet (40 mg total) by mouth daily. 90 tablet 3  . gabapentin (NEURONTIN) 300 MG capsule Take 1 capsule (300 mg total) by mouth 3 (three) times daily. 270 capsule 1  . glipiZIDE (GLIPIZIDE XL) 10 MG 24 hr tablet Take 1 tablet (10 mg total) by mouth daily with breakfast. 90 tablet 3  . glucose monitoring kit (FREESTYLE) monitoring kit 1 each by Does not apply route as needed for other. Dispense any model that is covered- dispense testing supplies for Q AC/ HS accuchecks- 1 month supply with one refil. 1 each 1  . hydrochlorothiazide (MICROZIDE) 12.5 MG capsule Take 1 capsule (12.5 mg total) by mouth daily. 90 capsule 3  . lisinopril (PRINIVIL,ZESTRIL) 40 MG tablet Take 1 tablet (40 mg total) by mouth daily. 90 tablet 3  . meloxicam (MOBIC) 7.5 MG tablet Take 1 tablet (7.5 mg total) by mouth daily as needed for pain. 30 tablet 3  . metFORMIN (GLUCOPHAGE) 1000 MG tablet Take 1 tablet (1,000 mg total) by  mouth 2 (two) times daily with a meal. 180 tablet 3   No facility-administered medications prior to visit.     ROS Review of Systems  Constitutional: Negative for chills, fatigue, fever and unexpected weight change.  HENT: Positive for hearing loss.   Eyes: Negative for visual disturbance.  Respiratory: Negative for cough and shortness of breath.   Cardiovascular: Negative for chest pain, palpitations and leg swelling.  Gastrointestinal: Negative for abdominal pain, blood in stool, constipation, diarrhea, nausea and vomiting.  Endocrine: Negative for polydipsia, polyphagia and polyuria.  Musculoskeletal: Positive for back pain. Negative for arthralgias, gait problem, myalgias and neck pain.  Skin: Positive for wound. Negative for rash.  Allergic/Immunologic: Negative for immunocompromised state.  Neurological: Positive for weakness (on L side since stroke ) and numbness (in Feet L >R).  Hematological: Negative for adenopathy. Does not bruise/bleed easily.  Psychiatric/Behavioral: Negative for dysphoric mood, sleep disturbance and suicidal ideas. The patient is not nervous/anxious.     Objective:  BP (!) 159/88 (BP Location: Right Arm, Patient Position: Sitting, Cuff Size: Large)   Pulse 70   Temp 98.6 F (37 C) (Oral)   Ht 6' (1.829 m)   Wt 268 lb (121.6 kg)   SpO2 95%   BMI 36.35 kg/m   BP/Weight 04/27/2016 03/23/2016 02/28/2016    Systolic BP - 937 169  Diastolic BP - 91 84  Wt. (Lbs) 268 263.4 267.8  BMI 36.35 35.72 36.32    Physical Exam  Constitutional: He appears well-developed and well-nourished. No distress.  HENT:  Head: Normocephalic and atraumatic.  Right Ear: External ear normal.  Left Ear: Tympanic membrane, external ear and ear canal normal.  R- TM obscured for wax L- scant wax in ext canal   Neck: Normal range of motion. Neck supple.  Cardiovascular: Normal rate, regular rhythm, normal heart sounds and intact distal pulses.   Pulmonary/Chest: Effort normal  and breath sounds normal.  Musculoskeletal: He exhibits no edema.       Left shoulder: He exhibits decreased range of motion.       Arms: Neurological: He is alert.  Skin: Skin is warm and dry. No rash noted. No erythema.  Psychiatric: He has a normal mood and affect.    Assessment & Plan:  [name] was seen today for arm pain.  Diagnoses and all orders for this visit:  Hyperlipidemia associated with type 2 diabetes mellitus ([place]) -     POCT glucose (manual entry)  Upper back pain on left side -     cyclobenzaprine (FLEXERIL) 10 MG tablet; Take 1 tablet (10 mg total) by mouth 2 (two) times daily as needed for muscle spasms. For next 7 days  Hearing loss due to cerumen impaction, right   There are no diagnoses linked to this encounter.  No orders of the defined types were placed in this encounter.   Follow-up: Return in about 2 weeks (around 05/11/2016) for HTN .   Boykin Nearing MD

## 2016-04-27 NOTE — Patient Instructions (Addendum)
[name] was seen today for arm pain.  Diagnoses and all orders for this visit:  Hyperlipidemia associated with type 2 diabetes mellitus ([place]) -     POCT glucose (manual entry)  Upper back pain on left side -     cyclobenzaprine (FLEXERIL) 10 MG tablet; Take 1 tablet (10 mg total) by mouth 2 (two) times daily as needed for muscle spasms. For next 7 days  Hearing loss due to cerumen impaction, right   F/u in 2 weeks for f/u L upper back pain   Dr. Adrian Blackwater

## 2016-04-28 DIAGNOSIS — H6121 Impacted cerumen, right ear: Secondary | ICD-10-CM | POA: Insufficient documentation

## 2016-04-28 DIAGNOSIS — M549 Dorsalgia, unspecified: Secondary | ICD-10-CM | POA: Insufficient documentation

## 2016-04-28 NOTE — Assessment & Plan Note (Signed)
MSK pain without red flags Plan continue mobic Add flexeril

## 2016-04-28 NOTE — Assessment & Plan Note (Signed)
Ear irrigated today

## 2016-05-03 ENCOUNTER — Encounter: Payer: Self-pay | Admitting: Podiatry

## 2016-05-03 ENCOUNTER — Ambulatory Visit (INDEPENDENT_AMBULATORY_CARE_PROVIDER_SITE_OTHER): Payer: Medicaid Other | Admitting: Podiatry

## 2016-05-03 VITALS — BP 152/77 | HR 70 | Resp 18

## 2016-05-03 DIAGNOSIS — M79676 Pain in unspecified toe(s): Secondary | ICD-10-CM

## 2016-05-03 DIAGNOSIS — B351 Tinea unguium: Secondary | ICD-10-CM | POA: Diagnosis not present

## 2016-05-03 NOTE — Progress Notes (Signed)
Patient ID: [name], male   DOB: June 01, 1961, 55 y.o.   MRN: [id number]    Subjective: This patient presents for a scheduled visit complaining of thick and elongated toenails which are uncomfortable walking wearing shoes as well as a painful callus on the plantar aspect of the left hallux. He is requesting debridement of the nails as well as a callus. He admits that he does not wear shoes at all times at home Patient's cousin present in treatment room today  Objective: Peripheral pitting edema bilaterally DP and PT pulses 1/4 bilaterally Capillary reflex immediate bilaterally Sensation to 10 g monofilament wire intact 0/5 right and 2/5 left Vibratory sensation nonreactive bilaterally No open skin lesions bilaterally The toenails are elongated, incurvated, discolored, deformed, hypertrophic and tender to direct palpation 6-10 Large bleeding callus sub-left hallux that remains closed after debridement   Assessment: Symptomatic onychomycoses 6-10 Pre-ulcerative plantar callus left hallux Type II diabetic with diminished pedal pulses Type II diabetic with peripheral neuropathy  Plan: Debridement toenails 6-10 mechanically and electrically. Without any bleeding  Debrided plantar callus left hallux without any bleeding Advised patient to wear shoes at all times  Reappoint 3 months

## 2016-05-03 NOTE — Patient Instructions (Signed)
Diabetes and Foot Care Diabetes may cause you to have problems because of poor blood supply (circulation) to your feet and legs. This may cause the skin on your feet to become thinner, break easier, and heal more slowly. Your skin may become dry, and the skin may peel and crack. You may also have nerve damage in your legs and feet causing decreased feeling in them. You may not notice minor injuries to your feet that could lead to infections or more serious problems. Taking care of your feet is one of the most important things you can do for yourself.  HOME CARE INSTRUCTIONS  Wear shoes at all times, even in the house. Do not go barefoot. Bare feet are easily injured.  Check your feet daily for blisters, cuts, and redness. If you cannot see the bottom of your feet, use a mirror or ask someone for help.  Wash your feet with warm water (do not use hot water) and mild soap. Then pat your feet and the areas between your toes until they are completely dry. Do not soak your feet as this can dry your skin.  Apply a moisturizing lotion or petroleum jelly (that does not contain alcohol and is unscented) to the skin on your feet and to dry, brittle toenails. Do not apply lotion between your toes.  Trim your toenails straight across. Do not dig under them or around the cuticle. File the edges of your nails with an emery board or nail file.  Do not cut corns or calluses or try to remove them with medicine.  Wear clean socks or stockings every day. Make sure they are not too tight. Do not wear knee-high stockings since they may decrease blood flow to your legs.  Wear shoes that fit properly and have enough cushioning. To break in new shoes, wear them for just a few hours a day. This prevents you from injuring your feet. Always look in your shoes before you put them on to be sure there are no objects inside.  Do not cross your legs. This may decrease the blood flow to your feet.  If you find a minor scrape,  cut, or break in the skin on your feet, keep it and the skin around it clean and dry. These areas may be cleansed with mild soap and water. Do not cleanse the area with peroxide, alcohol, or iodine.  When you remove an adhesive bandage, be sure not to damage the skin around it.  If you have a wound, look at it several times a day to make sure it is healing.  Do not use heating pads or hot water bottles. They may burn your skin. If you have lost feeling in your feet or legs, you may not know it is happening until it is too late.  Make sure your health care provider performs a complete foot exam at least annually or more often if you have foot problems. Report any cuts, sores, or bruises to your health care provider immediately. SEEK MEDICAL CARE IF:   You have an injury that is not healing.  You have cuts or breaks in the skin.  You have an ingrown nail.  You notice redness on your legs or feet.  You feel burning or tingling in your legs or feet.  You have pain or cramps in your legs and feet.  Your legs or feet are numb.  Your feet always feel cold. SEEK IMMEDIATE MEDICAL CARE IF:   There is increasing redness,   swelling, or pain in or around a wound.  There is a red line that goes up your leg.  Pus is coming from a wound.  You develop a fever or as directed by your health care provider.  You notice a bad smell coming from an ulcer or wound.   This information is not intended to replace advice given to you by your health care provider. Make sure you discuss any questions you have with your health care provider.   Document Released: 07/07/2000 Document Revised: 03/12/2013 Document Reviewed: 12/17/2012 Elsevier Interactive Patient Education 2016 [employer]  

## 2016-05-10 ENCOUNTER — Encounter: Payer: Self-pay | Admitting: Family Medicine

## 2016-05-10 ENCOUNTER — Ambulatory Visit: Payer: Medicaid Other | Attending: Family Medicine | Admitting: Family Medicine

## 2016-05-10 VITALS — BP 143/81 | HR 73 | Temp 97.7°F | Ht 72.0 in | Wt 270.0 lb

## 2016-05-10 DIAGNOSIS — E785 Hyperlipidemia, unspecified: Secondary | ICD-10-CM | POA: Diagnosis not present

## 2016-05-10 DIAGNOSIS — F1410 Cocaine abuse, uncomplicated: Secondary | ICD-10-CM | POA: Diagnosis not present

## 2016-05-10 DIAGNOSIS — Z79899 Other long term (current) drug therapy: Secondary | ICD-10-CM | POA: Insufficient documentation

## 2016-05-10 DIAGNOSIS — M549 Dorsalgia, unspecified: Secondary | ICD-10-CM

## 2016-05-10 DIAGNOSIS — Z23 Encounter for immunization: Secondary | ICD-10-CM | POA: Diagnosis not present

## 2016-05-10 DIAGNOSIS — Z7984 Long term (current) use of oral hypoglycemic drugs: Secondary | ICD-10-CM | POA: Diagnosis not present

## 2016-05-10 DIAGNOSIS — E119 Type 2 diabetes mellitus without complications: Secondary | ICD-10-CM | POA: Insufficient documentation

## 2016-05-10 DIAGNOSIS — I10 Essential (primary) hypertension: Secondary | ICD-10-CM | POA: Diagnosis not present

## 2016-05-10 DIAGNOSIS — M48061 Spinal stenosis, lumbar region without neurogenic claudication: Secondary | ICD-10-CM | POA: Diagnosis not present

## 2016-05-10 DIAGNOSIS — M546 Pain in thoracic spine: Secondary | ICD-10-CM | POA: Insufficient documentation

## 2016-05-10 DIAGNOSIS — F17210 Nicotine dependence, cigarettes, uncomplicated: Secondary | ICD-10-CM | POA: Diagnosis not present

## 2016-05-10 DIAGNOSIS — E1169 Type 2 diabetes mellitus with other specified complication: Secondary | ICD-10-CM | POA: Diagnosis not present

## 2016-05-10 LAB — GLUCOSE, POCT (MANUAL RESULT ENTRY): POC GLUCOSE: 146 mg/dL — AB (ref 70–99)

## 2016-05-10 LAB — POCT GLYCOSYLATED HEMOGLOBIN (HGB A1C): HEMOGLOBIN A1C: 7.5

## 2016-05-10 NOTE — Assessment & Plan Note (Addendum)
Improved with almost complete resolution Continue flexeril and NSAID prn only

## 2016-05-10 NOTE — Patient Instructions (Addendum)
[name] was seen today for back pain.  Diagnoses and all orders for this visit:  Hyperlipidemia associated with type 2 diabetes mellitus ([place]) -     POCT glucose (manual entry) -     POCT glycosylated hemoglobin (Hb A1C)  Upper back pain on left side   Back pain improved Continue current regimen as needed  F.u in 3 months for HTN and diabetes   Dr. Adrian Blackwater

## 2016-05-10 NOTE — Progress Notes (Signed)
Subjective:  Patient ID: [name], male    DOB: 1961-06-08  Age: 55 y.o. 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  L upper back pain:  Started on 04/27/2016. He presented to care the same day. Exam and hx was concerning for muscle spasm. He was prescribed flexeril. Pain is now 1/10. Has baseline ROM of left upper arm. Overall doing much better.    Social History  Substance Use Topics  . Smoking status: Current Every Day Smoker    Packs/day: 0.25    Types: Cigarettes  . Smokeless tobacco: Never Used  . Alcohol use 1.2 - 1.8 oz/week    2 - 3 Cans of beer per week     Comment: occassional   Outpatient Medications Prior to Visit  Medication Sig Dispense Refill  . atorvastatin (LIPITOR) 40 MG tablet Take 1 tablet (40 mg total) by mouth daily. 90 tablet 3  . gabapentin (NEURONTIN) 300 MG capsule Take 1 capsule (300 mg total) by mouth 3 (three) times daily. 270 capsule 1  . glipiZIDE (GLIPIZIDE XL) 10 MG 24 hr tablet Take 1 tablet (10 mg total) by mouth daily with breakfast. 90 tablet 3  . glucose monitoring kit (FREESTYLE) monitoring kit 1 each by Does not apply route as needed for other. Dispense any model that is covered- dispense testing supplies for Q AC/ HS accuchecks- 1 month supply with one refil. 1 each 1  . hydrochlorothiazide (MICROZIDE) 12.5 MG capsule Take 1 capsule (12.5 mg total) by mouth daily. 90 capsule 3  . lisinopril (PRINIVIL,ZESTRIL) 40 MG tablet Take 1 tablet (40 mg total) by mouth daily. 90 tablet 3  . meloxicam (MOBIC) 7.5 MG tablet Take 1 tablet (7.5 mg total) by mouth daily as needed for pain. 30 tablet 3  . metFORMIN (GLUCOPHAGE) 1000 MG tablet Take 1 tablet (1,000 mg total) by mouth 2 (two) times daily with a meal. 180 tablet 3   No facility-administered medications prior to visit.     ROS Review of Systems    Constitutional: Negative for chills, fatigue, fever and unexpected weight change.  HENT: Positive for hearing loss.   Eyes: Negative for visual disturbance.  Respiratory: Negative for cough and shortness of breath.   Cardiovascular: Negative for chest pain, palpitations and leg swelling.  Gastrointestinal: Negative for abdominal pain, blood in stool, constipation, diarrhea, nausea and vomiting.  Endocrine: Negative for polydipsia, polyphagia and polyuria.  Musculoskeletal: Positive for back pain. Negative for arthralgias, gait problem, myalgias and neck pain.  Skin: Positive for wound. Negative for rash.  Allergic/Immunologic: Negative for immunocompromised state.  Neurological: Positive for weakness (on L side since stroke ) and numbness (in Feet L >R).  Hematological: Negative for adenopathy. Does not bruise/bleed easily.  Psychiatric/Behavioral: Negative for dysphoric mood, sleep disturbance and suicidal ideas. The patient is not nervous/anxious.     Objective:  BP (!) 143/81 (BP Location: Right Arm, Patient Position: Sitting, Cuff Size: Large)   Pulse 73   Temp 97.7 F (36.5 C) (Oral)   Ht 6' (1.829 m)   Wt 270 lb (122.5 kg)   SpO2 100%   BMI 36.62 kg/m   BP/Weight 05/10/2016 05/03/2016 03/26/2670  Systolic BP 245 809 983  Diastolic BP 81 77 88  Wt. (Lbs) 270 - 268  BMI 36.62 - 36.35    Physical Exam  Constitutional: He appears well-developed and well-nourished. No distress.  HENT:  Head: Normocephalic and atraumatic.  Left Ear: External ear normal.  Neck: Normal range of motion. Neck supple.  Cardiovascular: Normal rate, regular rhythm, normal heart sounds and intact distal pulses.   Pulmonary/Chest: Effort normal and breath sounds normal.  Musculoskeletal: He exhibits no edema.       Left shoulder: He exhibits decreased range of motion.       Arms: Neurological: He is alert.  Skin: Skin is warm and dry. No rash noted. No erythema.  Psychiatric: He has a normal  mood and affect.   Lab Results  Component Value Date   HGBA1C 7.5 05/10/2016    Assessment & Plan:  [name] was seen today for back pain.  Diagnoses and all orders for this visit:  Hyperlipidemia associated with type 2 diabetes mellitus ([place]) -     POCT glucose (manual entry) -     POCT glycosylated hemoglobin (Hb A1C)  Upper back pain on left side  Other orders -     Pneumococcal polysaccharide vaccine 23-valent greater than or equal to 2yo subcutaneous/IM   There are no diagnoses linked to this encounter.  No orders of the defined types were placed in this encounter.   Follow-up: No Follow-up on file.   Boykin Nearing MD

## 2016-05-16 DIAGNOSIS — L731 Pseudofolliculitis barbae: Secondary | ICD-10-CM | POA: Diagnosis not present

## 2016-05-19 ENCOUNTER — Other Ambulatory Visit: Payer: Self-pay | Admitting: Family Medicine

## 2016-06-29 DIAGNOSIS — F4321 Adjustment disorder with depressed mood: Secondary | ICD-10-CM | POA: Diagnosis not present

## 2016-07-18 ENCOUNTER — Other Ambulatory Visit: Payer: Self-pay | Admitting: Family Medicine

## 2016-07-18 DIAGNOSIS — F4321 Adjustment disorder with depressed mood: Secondary | ICD-10-CM | POA: Diagnosis not present

## 2016-07-18 DIAGNOSIS — M48061 Spinal stenosis, lumbar region without neurogenic claudication: Secondary | ICD-10-CM

## 2016-08-02 ENCOUNTER — Ambulatory Visit (INDEPENDENT_AMBULATORY_CARE_PROVIDER_SITE_OTHER): Payer: Medicaid Other | Admitting: Podiatry

## 2016-08-02 DIAGNOSIS — M79676 Pain in unspecified toe(s): Secondary | ICD-10-CM

## 2016-08-02 DIAGNOSIS — E1142 Type 2 diabetes mellitus with diabetic polyneuropathy: Secondary | ICD-10-CM | POA: Diagnosis not present

## 2016-08-02 DIAGNOSIS — L84 Corns and callosities: Secondary | ICD-10-CM | POA: Diagnosis not present

## 2016-08-02 DIAGNOSIS — B351 Tinea unguium: Secondary | ICD-10-CM | POA: Diagnosis not present

## 2016-08-02 NOTE — Patient Instructions (Signed)

## 2016-08-02 NOTE — Progress Notes (Signed)
Patient ID: [name], male   DOB: 1960/08/28, 56 y.o.   MRN: [id number]    This patient presents for a scheduled visit complaining of thick and elongated toenails which are uncomfortable walking wearing shoes as well as a painful callus on the plantar aspect of the left hallux. He is requesting debridement of the nails as well as a callus. He admits that he does not wear shoes at all times at home Patient's cousin present in treatment room today  Objective: Peripheral pitting edema bilaterally DP and PT pulses 1/4 bilaterally Capillary reflex immediate bilaterally Sensation to 10 g monofilament wire intact 0/5 right and 2/5 left Vibratory sensation nonreactive bilaterally No open skin lesions bilaterally The toenails are elongated, incurvated, discolored, deformed, hypertrophic and tender to direct palpation 6-10 Large bleeding callus sub-left hallux that remains closed after debridement Slow unsteady gait requiring roller walker  Assessment: Symptomatic onychomycoses 6-10 Pre-ulcerative plantar callus left hallux Type II diabetic with diminished pedal pulses Type II diabetic with peripheral neuropathy  Plan: Debridement toenails 6-10 mechanically and electrically. Without any bleeding  Debrided plantar callus left hallux without any bleeding Advised patient to wear shoes at all times  Reappoint 3 months

## 2016-08-04 DIAGNOSIS — L731 Pseudofolliculitis barbae: Secondary | ICD-10-CM | POA: Diagnosis not present

## 2016-08-14 ENCOUNTER — Other Ambulatory Visit: Payer: Self-pay | Admitting: Family Medicine

## 2016-08-14 DIAGNOSIS — M48061 Spinal stenosis, lumbar region without neurogenic claudication: Secondary | ICD-10-CM

## 2016-09-11 DIAGNOSIS — F4321 Adjustment disorder with depressed mood: Secondary | ICD-10-CM | POA: Diagnosis not present

## 2016-09-28 DIAGNOSIS — Z5181 Encounter for therapeutic drug level monitoring: Secondary | ICD-10-CM | POA: Diagnosis not present

## 2016-09-28 DIAGNOSIS — Z79899 Other long term (current) drug therapy: Secondary | ICD-10-CM | POA: Diagnosis not present

## 2016-09-28 DIAGNOSIS — F4321 Adjustment disorder with depressed mood: Secondary | ICD-10-CM | POA: Diagnosis not present

## 2016-10-10 ENCOUNTER — Other Ambulatory Visit: Payer: Self-pay | Admitting: Family Medicine

## 2016-10-10 DIAGNOSIS — F4321 Adjustment disorder with depressed mood: Secondary | ICD-10-CM | POA: Diagnosis not present

## 2016-10-31 ENCOUNTER — Ambulatory Visit: Payer: Medicaid Other | Admitting: Podiatry

## 2016-10-31 DIAGNOSIS — F4321 Adjustment disorder with depressed mood: Secondary | ICD-10-CM | POA: Diagnosis not present

## 2016-11-02 ENCOUNTER — Other Ambulatory Visit: Payer: Self-pay | Admitting: Family Medicine

## 2016-11-02 DIAGNOSIS — E118 Type 2 diabetes mellitus with unspecified complications: Secondary | ICD-10-CM

## 2016-11-02 MED ORDER — GLIPIZIDE ER 10 MG PO TB24: 10.0000 mg | ORAL_TABLET | Freq: Every day | ORAL | 3 refills | Status: DC

## 2016-11-21 DIAGNOSIS — F4321 Adjustment disorder with depressed mood: Secondary | ICD-10-CM | POA: Diagnosis not present

## 2016-11-28 ENCOUNTER — Ambulatory Visit: Payer: Medicare Other | Attending: Family Medicine | Admitting: Family Medicine

## 2016-11-28 ENCOUNTER — Encounter: Payer: Self-pay | Admitting: Family Medicine

## 2016-11-28 VITALS — BP 147/82 | HR 74 | Temp 97.8°F | Ht 72.0 in | Wt 257.6 lb

## 2016-11-28 DIAGNOSIS — Z23 Encounter for immunization: Secondary | ICD-10-CM

## 2016-11-28 DIAGNOSIS — E118 Type 2 diabetes mellitus with unspecified complications: Secondary | ICD-10-CM | POA: Insufficient documentation

## 2016-11-28 DIAGNOSIS — E119 Type 2 diabetes mellitus without complications: Secondary | ICD-10-CM | POA: Diagnosis present

## 2016-11-28 DIAGNOSIS — N529 Male erectile dysfunction, unspecified: Secondary | ICD-10-CM | POA: Insufficient documentation

## 2016-11-28 DIAGNOSIS — R42 Dizziness and giddiness: Secondary | ICD-10-CM | POA: Insufficient documentation

## 2016-11-28 DIAGNOSIS — Z79899 Other long term (current) drug therapy: Secondary | ICD-10-CM | POA: Diagnosis not present

## 2016-11-28 DIAGNOSIS — I69354 Hemiplegia and hemiparesis following cerebral infarction affecting left non-dominant side: Secondary | ICD-10-CM | POA: Insufficient documentation

## 2016-11-28 DIAGNOSIS — F17210 Nicotine dependence, cigarettes, uncomplicated: Secondary | ICD-10-CM | POA: Diagnosis not present

## 2016-11-28 DIAGNOSIS — I10 Essential (primary) hypertension: Secondary | ICD-10-CM

## 2016-11-28 DIAGNOSIS — Z0000 Encounter for general adult medical examination without abnormal findings: Secondary | ICD-10-CM | POA: Diagnosis not present

## 2016-11-28 DIAGNOSIS — R531 Weakness: Secondary | ICD-10-CM | POA: Diagnosis not present

## 2016-11-28 DIAGNOSIS — Z7984 Long term (current) use of oral hypoglycemic drugs: Secondary | ICD-10-CM | POA: Diagnosis not present

## 2016-11-28 DIAGNOSIS — N5201 Erectile dysfunction due to arterial insufficiency: Secondary | ICD-10-CM

## 2016-11-28 LAB — POCT GLYCOSYLATED HEMOGLOBIN (HGB A1C): Hemoglobin A1C: 6.6

## 2016-11-28 LAB — GLUCOSE, POCT (MANUAL RESULT ENTRY): POC GLUCOSE: 114 mg/dL — AB (ref 70–99)

## 2016-11-28 MED ORDER — GLIPIZIDE ER 10 MG PO TB24: 10.0000 mg | ORAL_TABLET | Freq: Every day | ORAL | 3 refills | Status: DC

## 2016-11-28 MED ORDER — ATORVASTATIN CALCIUM 40 MG PO TABS: 40.0000 mg | ORAL_TABLET | Freq: Every day | ORAL | 3 refills | Status: DC

## 2016-11-28 MED ORDER — METFORMIN HCL 1000 MG PO TABS: 1000.0000 mg | ORAL_TABLET | Freq: Two times a day (BID) | ORAL | 3 refills | Status: DC

## 2016-11-28 MED ORDER — SILDENAFIL CITRATE 100 MG PO TABS: 50.0000 mg | ORAL_TABLET | Freq: Every day | ORAL | 11 refills | Status: DC | PRN

## 2016-11-28 MED ORDER — HYDROCHLOROTHIAZIDE 25 MG PO TABS: 25.0000 mg | ORAL_TABLET | Freq: Every day | ORAL | 3 refills | Status: DC

## 2016-11-28 MED ORDER — LISINOPRIL 40 MG PO TABS: 40.0000 mg | ORAL_TABLET | Freq: Every day | ORAL | 3 refills | Status: DC

## 2016-11-28 NOTE — Assessment & Plan Note (Signed)
A: slightly elevated P: Increase HCTZ to 25 mg daily continue lisinopril 40 mg daily

## 2016-11-28 NOTE — Patient Instructions (Addendum)
Korin was seen today for diabetes.  Diagnoses and all orders for this visit:  Type 2 diabetes mellitus with complication, without long-term current use of insulin ([facility]) -     POCT glucose (manual entry) -     POCT glycosylated hemoglobin (Hb A1C) -     glipiZIDE (GLIPIZIDE XL) 10 MG 24 hr tablet; Take 1 tablet (10 mg total) by mouth daily with breakfast. -     metFORMIN (GLUCOPHAGE) 1000 MG tablet; Take 1 tablet (1,000 mg total) by mouth 2 (two) times daily with a meal. -     atorvastatin (LIPITOR) 40 MG tablet; Take 1 tablet (40 mg total) by mouth daily.  Essential hypertension -     lisinopril (PRINIVIL,ZESTRIL) 40 MG tablet; Take 1 tablet (40 mg total) by mouth daily. -     hydrochlorothiazide (HYDRODIURIL) 25 MG tablet; Take 1 tablet (25 mg total) by mouth daily.  Left-sided weakness -     Referral to Neuro Rehab  Healthcare maintenance -     Ambulatory referral to Gastroenterology  Erectile dysfunction due to arterial insufficiency -     sildenafil (VIAGRA) 100 MG tablet; Take 0.5-1 tablets (50-100 mg total) by mouth daily as needed for erectile dysfunction.   Do not mix viagra with nitroglycerin Caution regarding headache and low BP with viagra, start with 1/2 tab and rise from sitting slowly  Avoid cocaine use as it does impair blood flow and cause constriction of vessels  f/u in 3 months for HTN and diabetes  Dr. Adrian Blackwater

## 2016-11-28 NOTE — Assessment & Plan Note (Signed)
Referral to neurorehab

## 2016-11-28 NOTE — Assessment & Plan Note (Signed)
Well-controlled.  Continue current regimen. 

## 2016-11-28 NOTE — Progress Notes (Signed)
Subjective:  Patient ID: [name], male    DOB: 1960/11/19  Age: 56 y.o. MRN: [id number]  CC: Diabetes   HPI Pepper KEISHON CHAVARIN presents for    1. CHRONIC DIABETES  Disease Monitoring  Blood Sugar Ranges: 114  Polyuria: no   Visual problems: no   Medication Compliance: yes  Medication Side Effects  Hypoglycemia: yes, in 6s in January    [place] Exam: due in 12/2016  Foot Exam: done today     2. HTN: taking HCTZ and lisinopril. No HA, CP or SOB. Slight leg swelling. Reports dizziness upon standing at times. Walks with a cane.  3. Erectile dysfunction: x 5 years. Not currently sexually active. Uses cocaine, last use 3-4 days ago. Has trouble obtaining strong erection.   Social History  Substance Use Topics  . Smoking status: Current Every Day Smoker    Packs/day: 0.25    Types: Cigarettes  . Smokeless tobacco: Never Used  . Alcohol use 1.2 - 1.8 oz/week    2 - 3 Cans of beer per week     Comment: occassional    Outpatient Medications Prior to Visit  Medication Sig Dispense Refill  . atorvastatin (LIPITOR) 40 MG tablet Take 1 tablet (40 mg total) by mouth daily. 90 tablet 3  . gabapentin (NEURONTIN) 300 MG capsule TAKE 1 CAPSULE (300 MG TOTAL) BY MOUTH THREE TIMES DAILY. 270 capsule 0  . glipiZIDE (GLIPIZIDE XL) 10 MG 24 hr tablet Take 1 tablet (10 mg total) by mouth daily with breakfast. 90 tablet 3  . glucose monitoring kit (FREESTYLE) monitoring kit 1 each by Does not apply route as needed for other. Dispense any model that is covered- dispense testing supplies for Q AC/ HS accuchecks- 1 month supply with one refil. 1 each 1  . hydrochlorothiazide (MICROZIDE) 12.5 MG capsule Take 1 capsule (12.5 mg total) by mouth daily. 90 capsule 3  . lisinopril (PRINIVIL,ZESTRIL) 40 MG tablet Take 1 tablet (40 mg total) by mouth daily. 90 tablet 3  . meloxicam (MOBIC) 7.5 MG tablet TAKE 1 TABLET (7.5 MG TOTAL) BY MOUTH DAILY AS NEEDED FOR PAIN. 30 tablet  2  . metFORMIN (GLUCOPHAGE) 1000 MG tablet Take 1 tablet (1,000 mg total) by mouth 2 (two) times daily with a meal. 180 tablet 3   No facility-administered medications prior to visit.     ROS Review of Systems  Constitutional: Negative for chills, fatigue, fever and unexpected weight change.  HENT: Positive for hearing loss.   Eyes: Negative for visual disturbance.  Respiratory: Negative for cough and shortness of breath.   Cardiovascular: Negative for chest pain, palpitations and leg swelling.  Gastrointestinal: Negative for abdominal pain, blood in stool, constipation, diarrhea, nausea and vomiting.  Endocrine: Negative for polydipsia, polyphagia and polyuria.  Musculoskeletal: Negative for arthralgias, back pain, gait problem, myalgias and neck pain.  Skin: Negative for rash and wound.  Allergic/Immunologic: Negative for immunocompromised state.  Neurological: Positive for dizziness, weakness (on L side since stroke ) and numbness (in Feet L >R).  Hematological: Negative for adenopathy. Does not bruise/bleed easily.  Psychiatric/Behavioral: Negative for dysphoric mood, sleep disturbance and suicidal ideas. The patient is not nervous/anxious.     Objective:  BP (!) 147/82   Pulse 74   Temp 97.8 F (36.6 C) (Oral)   Ht 6' (1.829 m)   Wt 257 lb 9.6 oz (116.8 kg)   SpO2 98%   BMI 34.94 kg/m   BP/Weight 11/28/2016  05/10/2016 08/14/2409  Systolic BP 464 314 276  Diastolic BP 82 81 77  Wt. (Lbs) 257.6 270 -  BMI 34.94 36.62 -     Physical Exam  Constitutional: He appears well-developed and well-nourished. No distress.  HENT:  Head: Normocephalic and atraumatic.  Neck: Normal range of motion. Neck supple.  Cardiovascular: Normal rate, regular rhythm, normal heart sounds and intact distal pulses.   Pulmonary/Chest: Effort normal and breath sounds normal.  Musculoskeletal: He exhibits no edema.  Neurological: He is alert.  Skin: Skin is warm and dry. No rash noted. No  erythema.  Psychiatric: He has a normal mood and affect.    Lab Results  Component Value Date   HGBA1C 6.6 11/28/2016   CBG 114  Assessment & Plan:   Demetrus was seen today for diabetes.  Diagnoses and all orders for this visit:  Type 2 diabetes mellitus with complication, without long-term current use of insulin (HCC) -     POCT glucose (manual entry) -     POCT glycosylated hemoglobin (Hb A1C) -     glipiZIDE (GLIPIZIDE XL) 10 MG 24 hr tablet; Take 1 tablet (10 mg total) by mouth daily with breakfast. -     metFORMIN (GLUCOPHAGE) 1000 MG tablet; Take 1 tablet (1,000 mg total) by mouth 2 (two) times daily with a meal. -     atorvastatin (LIPITOR) 40 MG tablet; Take 1 tablet (40 mg total) by mouth daily.  Essential hypertension -     lisinopril (PRINIVIL,ZESTRIL) 40 MG tablet; Take 1 tablet (40 mg total) by mouth daily. -     hydrochlorothiazide (HYDRODIURIL) 25 MG tablet; Take 1 tablet (25 mg total) by mouth daily.  Left-sided weakness -     Referral to Neuro Rehab  Healthcare maintenance -     Ambulatory referral to Gastroenterology     No orders of the defined types were placed in this encounter.   Follow-up: No Follow-up on file.   Boykin Nearing MD

## 2016-12-05 ENCOUNTER — Ambulatory Visit (INDEPENDENT_AMBULATORY_CARE_PROVIDER_SITE_OTHER): Payer: Medicaid Other | Admitting: Podiatry

## 2016-12-05 ENCOUNTER — Encounter: Payer: Self-pay | Admitting: Podiatry

## 2016-12-05 VITALS — BP 167/89 | HR 68

## 2016-12-05 DIAGNOSIS — B351 Tinea unguium: Secondary | ICD-10-CM

## 2016-12-05 DIAGNOSIS — M79676 Pain in unspecified toe(s): Secondary | ICD-10-CM

## 2016-12-05 DIAGNOSIS — L84 Corns and callosities: Secondary | ICD-10-CM | POA: Diagnosis not present

## 2016-12-05 DIAGNOSIS — E1142 Type 2 diabetes mellitus with diabetic polyneuropathy: Secondary | ICD-10-CM | POA: Diagnosis not present

## 2016-12-05 NOTE — Patient Instructions (Signed)
Removed Band-Aid in the fourth right toe in 1-3 days apply topical antibiotic ointment and a Band-Aid daily until a scab forms  Diabetes and Foot Care Diabetes may cause you to have problems because of poor blood supply (circulation) to your feet and legs. This may cause the skin on your feet to become thinner, break easier, and heal more slowly. Your skin may become dry, and the skin may peel and crack. You may also have nerve damage in your legs and feet causing decreased feeling in them. You may not notice minor injuries to your feet that could lead to infections or more serious problems. Taking care of your feet is one of the most important things you can do for yourself. Follow these instructions at home:  Wear shoes at all times, even in the house. Do not go barefoot. Bare feet are easily injured.  Check your feet daily for blisters, cuts, and redness. If you cannot see the bottom of your feet, use a mirror or ask someone for help.  Wash your feet with warm water (do not use hot water) and mild soap. Then pat your feet and the areas between your toes until they are completely dry. Do not soak your feet as this can dry your skin.  Apply a moisturizing lotion or petroleum jelly (that does not contain alcohol and is unscented) to the skin on your feet and to dry, brittle toenails. Do not apply lotion between your toes.  Trim your toenails straight across. Do not dig under them or around the cuticle. File the edges of your nails with an emery board or nail file.  Do not cut corns or calluses or try to remove them with medicine.  Wear clean socks or stockings every day. Make sure they are not too tight. Do not wear knee-high stockings since they may decrease blood flow to your legs.  Wear shoes that fit properly and have enough cushioning. To break in new shoes, wear them for just a few hours a day. This prevents you from injuring your feet. Always look in your shoes before you put them on to be  sure there are no objects inside.  Do not cross your legs. This may decrease the blood flow to your feet.  If you find a minor scrape, cut, or break in the skin on your feet, keep it and the skin around it clean and dry. These areas may be cleansed with mild soap and water. Do not cleanse the area with peroxide, alcohol, or iodine.  When you remove an adhesive bandage, be sure not to damage the skin around it.  If you have a wound, look at it several times a day to make sure it is healing.  Do not use heating pads or hot water bottles. They may burn your skin. If you have lost feeling in your feet or legs, you may not know it is happening until it is too late.  Make sure your health care provider performs a complete foot exam at least annually or more often if you have foot problems. Report any cuts, sores, or bruises to your health care provider immediately. Contact a health care provider if:  You have an injury that is not healing.  You have cuts or breaks in the skin.  You have an ingrown nail.  You notice redness on your legs or feet.  You feel burning or tingling in your legs or feet.  You have pain or cramps in your legs and  feet.  Your legs or feet are numb.  Your feet always feel cold. Get help right away if:  There is increasing redness, swelling, or pain in or around a wound.  There is a red line that goes up your leg.  Pus is coming from a wound.  You develop a fever or as directed by your health care provider.  You notice a bad smell coming from an ulcer or wound. This information is not intended to replace advice given to you by your health care provider. Make sure you discuss any questions you have with your health care provider. Document Released: 07/07/2000 Document Revised: 12/16/2015 Document Reviewed: 12/17/2012 Elsevier Interactive Patient Education  2017 [employer].

## 2016-12-05 NOTE — Progress Notes (Signed)
Patient ID: [name], male   DOB: [birth year], 58 y.o.   MRN: [id number]   This patient presents for a scheduled visit complaining of thick and elongated toenails which are uncomfortable walking wearing shoes as well as a painful callus on the plantar aspect of the left hallux. He is requesting debridement of the nails as well as a callus. He admits that he does not wear shoes at all times at home Patient's cousin present in treatment room today  Objective: Peripheral pitting edema bilaterally DP and PT pulses 1/4 bilaterally Capillary reflex immediate bilaterally Sensation to 10 g monofilament wire intact 0/5 right and 2/5 left Vibratory sensation nonreactive bilaterally No open skin lesions bilaterally The toenails are elongated, incurvated, discolored, deformed, hypertrophic and tender to direct palpation 6-10 Large bleeding callus sub-left hallux that remains closed after debridement Slow unsteady gait requiring roller walker  Assessment: Symptomatic onychomycoses 6-10 Pre-ulcerative plantar callus left hallux Type II diabetic with diminished pedal pulses Type II diabetic with peripheral neuropathy  Plan: Debridement toenails 6-10 mechanically and electrically with slight bleeding distal fourth right toe to topical antibiotic ointment and Band-Aid. Patient instructed removed Band-Aid 1-3 days a continue applying topical antibiotic ointment and a Band-Aid until a scab forms. Debrided plantar callus left hallux without any bleeding Advised patient to wear shoes at all times  Reappoint 3 months

## 2016-12-06 ENCOUNTER — Encounter: Payer: Self-pay | Admitting: Family Medicine

## 2016-12-20 ENCOUNTER — Other Ambulatory Visit: Payer: Self-pay | Admitting: Family Medicine

## 2016-12-20 DIAGNOSIS — M48061 Spinal stenosis, lumbar region without neurogenic claudication: Secondary | ICD-10-CM

## 2016-12-28 ENCOUNTER — Other Ambulatory Visit: Payer: Self-pay

## 2016-12-28 DIAGNOSIS — E118 Type 2 diabetes mellitus with unspecified complications: Secondary | ICD-10-CM

## 2016-12-28 DIAGNOSIS — I10 Essential (primary) hypertension: Secondary | ICD-10-CM

## 2016-12-28 MED ORDER — ATORVASTATIN CALCIUM 40 MG PO TABS: 40.0000 mg | ORAL_TABLET | Freq: Every day | ORAL | 3 refills | Status: DC

## 2016-12-28 MED ORDER — GLIPIZIDE ER 10 MG PO TB24
10.0000 mg | ORAL_TABLET | Freq: Every day | ORAL | 3 refills | Status: DC
Start: 2016-12-28 — End: 2017-10-22

## 2016-12-28 MED ORDER — LISINOPRIL 40 MG PO TABS: 40.0000 mg | ORAL_TABLET | Freq: Every day | ORAL | 3 refills | Status: DC

## 2016-12-28 MED ORDER — METFORMIN HCL 1000 MG PO TABS: 1000.0000 mg | ORAL_TABLET | Freq: Two times a day (BID) | ORAL | 3 refills | Status: DC

## 2017-01-04 DIAGNOSIS — F4321 Adjustment disorder with depressed mood: Secondary | ICD-10-CM | POA: Diagnosis not present

## 2017-01-19 ENCOUNTER — Emergency Department (HOSPITAL_COMMUNITY): Payer: Medicare Other

## 2017-01-19 ENCOUNTER — Encounter (HOSPITAL_COMMUNITY): Payer: Self-pay | Admitting: *Deleted

## 2017-01-19 ENCOUNTER — Emergency Department (HOSPITAL_COMMUNITY)
Admission: EM | Admit: 2017-01-19 | Discharge: 2017-01-19 | Disposition: A | Discharge status: Home / Self Care | Payer: Medicare Other | Attending: Emergency Medicine | Admitting: Emergency Medicine

## 2017-01-19 DIAGNOSIS — Z79899 Other long term (current) drug therapy: Secondary | ICD-10-CM | POA: Insufficient documentation

## 2017-01-19 DIAGNOSIS — I10 Essential (primary) hypertension: Secondary | ICD-10-CM | POA: Insufficient documentation

## 2017-01-19 DIAGNOSIS — R531 Weakness: Secondary | ICD-10-CM | POA: Diagnosis not present

## 2017-01-19 DIAGNOSIS — R05 Cough: Secondary | ICD-10-CM | POA: Diagnosis not present

## 2017-01-19 DIAGNOSIS — E1142 Type 2 diabetes mellitus with diabetic polyneuropathy: Secondary | ICD-10-CM | POA: Insufficient documentation

## 2017-01-19 DIAGNOSIS — Z7984 Long term (current) use of oral hypoglycemic drugs: Secondary | ICD-10-CM | POA: Insufficient documentation

## 2017-01-19 DIAGNOSIS — M546 Pain in thoracic spine: Secondary | ICD-10-CM | POA: Diagnosis not present

## 2017-01-19 DIAGNOSIS — F17210 Nicotine dependence, cigarettes, uncomplicated: Secondary | ICD-10-CM | POA: Diagnosis not present

## 2017-01-19 DIAGNOSIS — R29810 Facial weakness: Secondary | ICD-10-CM | POA: Diagnosis not present

## 2017-01-19 DIAGNOSIS — I6789 Other cerebrovascular disease: Secondary | ICD-10-CM | POA: Diagnosis not present

## 2017-01-19 LAB — CBC WITH DIFFERENTIAL/PLATELET
BASOS PCT: 1 %
Basophils Absolute: 0.1 10*3/uL (ref 0.0–0.1)
Eosinophils Absolute: 0.2 10*3/uL (ref 0.0–0.7)
Eosinophils Relative: 2 %
HEMATOCRIT: 47.2 % (ref 39.0–52.0)
HEMOGLOBIN: 15.1 g/dL (ref 13.0–17.0)
Lymphocytes Relative: 34 %
Lymphs Abs: 2.7 10*3/uL (ref 0.7–4.0)
MCH: 27.6 pg (ref 26.0–34.0)
MCHC: 32 g/dL (ref 30.0–36.0)
MCV: 86.3 fL (ref 78.0–100.0)
Monocytes Absolute: 0.7 10*3/uL (ref 0.1–1.0)
Monocytes Relative: 9 %
NEUTROS ABS: 4.2 10*3/uL (ref 1.7–7.7)
NEUTROS PCT: 54 %
Platelets: 208 10*3/uL (ref 150–400)
RBC: 5.47 MIL/uL (ref 4.22–5.81)
RDW: 13.7 % (ref 11.5–15.5)
WBC: 7.7 10*3/uL (ref 4.0–10.5)

## 2017-01-19 LAB — BASIC METABOLIC PANEL
ANION GAP: 7 (ref 5–15)
BUN: 16 mg/dL (ref 6–20)
CALCIUM: 9.7 mg/dL (ref 8.9–10.3)
CO2: 25 mmol/L (ref 22–32)
Chloride: 106 mmol/L (ref 101–111)
Creatinine, Ser: 1.1 mg/dL (ref 0.61–1.24)
Glucose, Bld: 80 mg/dL (ref 65–99)
Potassium: 4.8 mmol/L (ref 3.5–5.1)
Sodium: 138 mmol/L (ref 135–145)

## 2017-01-19 LAB — I-STAT TROPONIN, ED: Troponin i, poc: 0.01 ng/mL (ref 0.00–0.08)

## 2017-01-19 MED ORDER — LIDOCAINE 5 % EX PTCH: 1.0000 | MEDICATED_PATCH | CUTANEOUS | 0 refills | Status: DC

## 2017-01-19 MED ORDER — METHOCARBAMOL 500 MG PO TABS: 500.0000 mg | ORAL_TABLET | Freq: Four times a day (QID) | ORAL | 0 refills | Status: DC | PRN

## 2017-01-19 MED ORDER — METHOCARBAMOL 500 MG PO TABS
500.0000 mg | ORAL_TABLET | Freq: Once | ORAL | Status: AC
  Administered 2017-01-19: 500 mg via ORAL
  Filled 2017-01-19: qty 1

## 2017-01-19 NOTE — ED Notes (Signed)
Patient transported to X-ray 

## 2017-01-19 NOTE — ED Provider Notes (Signed)
[facility] DEPT Provider Note   CSN: [id number] Arrival date & time: 01/19/17  1006     History   Chief Complaint No chief complaint on file.   HPI [name] is a 56 y.o. male.  HPI   Pt with hx DM, HTN, cocaine and ETOH abuse, chronic pain p/w left scapular/left thoracic back pain that began yesterday morning while eating breakfast.  States the pain is constant and is worse with movement and palpation.  It is not pleuritic or exertional, not related to eating.  Has occasional heavy cough.  Last used cocaine two days ago.  Denies CP, SOB, abdominal pain, N/V.  Denies heavy lifting or injury to the area.  Has only taken gabapentin for his pain.   Past Medical History:  Diagnosis Date  . Alcohol abuse   . Chronic arm pain   . Chronic leg pain   . Chronic pain   . Cocaine abuse   . Diabetes mellitus   . Gait instability   . Hypertension   . 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 Peripheral edema    chronic LUE  . Tobacco abuse     Patient Active Problem List   Diagnosis Date Noted  . Hearing loss due to cerumen impaction, right 04/28/2016  . Sebaceous cyst 02/28/2016  . Mild nonproliferative diabetic retinopathy ([place]) 01/10/2016  . Hypertensive retinopathy of both eyes 01/10/2016  . Spinal stenosis of lumbar region 07/15/2015  . Abnormality of gait 06/07/2015  . Hydrocephalus 06/07/2015  . Peripheral neuropathy 04/26/2015  . Constipation 04/26/2015  . Diabetes mellitus type 2 with complications ([place]) 00/17/4944  . Decreased hearing of both ears 03/15/2015  . Tinea pedis 03/15/2015  . Callus of foot 05/22/2014  . 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 Coronary vasospasm, cocaine related 12/29/2013  . Cocaine abuse 12/28/2013  . Alcoholism /alcohol abuse ([place]) 12/28/2013  . Left-sided weakness 12/26/2013  . Hyperlipidemia associated with type 2 diabetes mellitus ([place]) 04/01/2007  . OBESITY NOS 03/28/2007  .  Essential hypertension 03/28/2007    Past Surgical History:  Procedure Laterality Date  . CARDIAC CATHETERIZATION  12/27/2013  . CARDIAC CATHETERIZATION  12/27/2013   Impression: 1. 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 Moderate disease in the small caliber intermediate branch, 3. Normal LV systolic function  . left arm skin graft    . LEFT HEART CATHETERIZATION WITH CORONARY ANGIOGRAM Bilateral 12/26/2013   Procedure: LEFT HEART CATHETERIZATION WITH CORONARY ANGIOGRAM;  Surgeon: Burnell Blanks, MD;  Location: [facility] CATH LAB;  Service: Cardiovascular;  Laterality: Bilateral;       Home Medications    Prior to Admission medications   Medication Sig Start Date End Date Taking? Authorizing Provider  amLODipine (NORVASC) 5 MG tablet Take 5 mg by mouth daily.   Yes [provider]  atorvastatin (LIPITOR) 40 MG tablet Take 1 tablet (40 mg total) by mouth daily. 12/28/16  Yes Funches, Josalyn, MD  gabapentin (NEURONTIN) 300 MG capsule TAKE 1 CAPSULE (300 MG TOTAL) BY MOUTH THREE TIMES DAILY. 10/11/16  Yes Funches, Josalyn, MD  glipiZIDE (GLIPIZIDE XL) 10 MG 24 hr tablet Take 1 tablet (10 mg total) by mouth daily with breakfast. 12/28/16  Yes Funches, Josalyn, MD  hydrochlorothiazide (HYDRODIURIL) 25 MG tablet Take 1 tablet (25 mg total) by mouth daily. Patient taking differently: Take 12.5 mg by mouth daily.  11/28/16  Yes Funches, Josalyn, MD  lisinopril (PRINIVIL,ZESTRIL) 40 MG tablet Take 1 tablet (  40 mg total) by mouth daily. 12/28/16  Yes Funches, Josalyn, MD  meloxicam (MOBIC) 7.5 MG tablet TAKE 1 TABLET (7.5 MG TOTAL) BY MOUTH DAILY AS NEEDED FOR PAIN. 12/25/16  Yes Funches, Josalyn, MD  metFORMIN (GLUCOPHAGE) 1000 MG tablet Take 1 tablet (1,000 mg total) by mouth 2 (two) times daily with a meal. 12/28/16  Yes Funches, Josalyn, MD  minocycline (MINOCIN,DYNACIN) 100 MG capsule Take 100 mg by mouth 2 (two) times daily.   Yes [provider]  sildenafil (VIAGRA) 100 MG tablet Take 0.5-1  tablets (50-100 mg total) by mouth daily as needed for erectile dysfunction. 11/28/16  Yes Funches, Josalyn, MD  glucose monitoring kit (FREESTYLE) monitoring kit 1 each by Does not apply route as needed for other. Dispense any model that is covered- dispense testing supplies for Q AC/ HS accuchecks- 1 month supply with one refil. 05/22/14   Lorayne Marek, MD  lidocaine (LIDODERM) 5 % Place 1 patch onto the skin daily. Remove & Discard patch within 12 hours or as directed by MD 01/19/17   Clayton Bibles, PA-C  methocarbamol (ROBAXIN) 500 MG tablet Take 1-2 tablets (500-1,000 mg total) by mouth every 6 (six) hours as needed (pain,). 01/19/17   Clayton Bibles, PA-C    Family History Family History  Problem Relation Age of Onset  . Hypertension Mother   . Diabetes Mother   . Cancer Mother        breast cancer   . Hypertension Father   . Heart disease Father   . Stroke Father   . Hypertension Sister     Social History Social History  Substance Use Topics  . Smoking status: Current Every Day Smoker    Packs/day: 0.25    Types: Cigarettes  . Smokeless tobacco: Never Used  . Alcohol use 1.2 - 1.8 oz/week    2 - 3 Cans of beer per week     Comment: occassional     Allergies   Patient has no known allergies.   Review of Systems Review of Systems  All other systems reviewed and are negative.    Physical Exam Updated Vital Signs BP (!) 149/85 (BP Location: Right Arm)   Pulse 82   Temp 98.5 F (36.9 C) (Oral)   Resp 19   Ht 6' 3" (1.905 m)   Wt 116.6 kg (257 lb)   SpO2 100%   BMI 32.12 kg/m   Physical Exam  Constitutional: He appears well-developed and well-nourished. No distress.  HENT:  Head: Normocephalic and atraumatic.  Neck: Neck supple.  Cardiovascular: Normal rate and regular rhythm.   Pulmonary/Chest: Effort normal and breath sounds normal. No respiratory distress. He has no wheezes. He has no rales.      No rash or skin changes overlying tender area.  Skin not  sensitive to light touch.    Abdominal: Soft. He exhibits no distension and no mass. There is no tenderness. There is no rebound and no guarding.  Neurological: He is alert. He exhibits normal muscle tone.  Skin: He is not diaphoretic.  Nursing note and vitals reviewed.    ED Treatments / Results  Labs (all labs ordered are listed, but only abnormal results are displayed) Labs Reviewed  BASIC METABOLIC PANEL  CBC WITH DIFFERENTIAL/PLATELET  Randolm Idol, ED    EKG  EKG Interpretation  Date/Time:  Friday January 19 2017 12:14:07 EDT Ventricular Rate:  58 PR Interval:    QRS Duration: 119 QT Interval:  422 QTC Calculation: 415 R  Axis:   -18 Text Interpretation:  Sinus rhythm Left anterior fascicular block Left ventricular hypertrophy Borderline T abnormalities, inferior leads No STEMI. Similar to prior.  Confirmed by Nanda Quinton [phone]) on 01/19/2017 12:58:05 PM       Radiology Dg Chest 2 View  Result Date: 01/19/2017 CLINICAL DATA:  Occasional heavy cough.  Scapular pain. EXAM: CHEST  2 VIEW COMPARISON:  01/23/2016; 10/03/2015; 04/26/2015 FINDINGS: Grossly unchanged enlarged cardiac silhouette and mediastinal contours. There is persistent thickening of the right paratracheal stripe, presumably secondary to prominent vasculature veiling opacities overlying the bilateral lower lungs are unchanged in favored to represent overlying breast tissues. Unchanged mild diffuse slightly nodular thickening of the pulmonary interstitium. No discrete focal airspace opacities. No pleural effusion or pneumothorax. No evidence of edema. No acute osseus abnormalities. IMPRESSION: Similar findings of cardiomegaly and chronic bronchitic change without acute cardiopulmonary disease. Electronically Signed   By: Sandi Mariscal M.D.   On: 01/19/2017 12:52    Procedures Procedures (including critical care time)  Medications Ordered in ED Medications  methocarbamol (ROBAXIN) tablet 500 mg (500 mg Oral  Given 01/19/17 1344)     Initial Impression / Assessment and Plan / ED Course  I have reviewed the triage vital signs and the nursing notes.  Pertinent labs & imaging results that were available during my care of the patient were reviewed by me and considered in my medical decision making (see chart for details).     Afebrile, nontoxic patient with reproducible pain in the thoracic back.  No rash present.  Cardiac workup given cocaine abuse and diabetes - reassuring.  Pt with constant pain for >24 hours. No CP or SOB.  Doubt ACS.  Doubt PE, PNA.  Likely musculoskeletal.  Less likely early zoster.   D/C home with lidoderm patch, robaxin, close PCP follow up.  Discussed result, findings, treatment, and follow up  with patient.  Pt given return precautions.  Pt verbalizes understanding and agrees with plan.       Final Clinical Impressions(s) / ED Diagnoses   Final diagnoses:  Acute left-sided thoracic back pain    New Prescriptions Discharge Medication List as of 01/19/2017  1:29 PM    START taking these medications   Details  lidocaine (LIDODERM) 5 % Place 1 patch onto the skin daily. Remove & Discard patch within 12 hours or as directed by MD, Starting Fri 01/19/2017, Print    methocarbamol (ROBAXIN) 500 MG tablet Take 1-2 tablets (500-1,000 mg total) by mouth every 6 (six) hours as needed (pain,)., Starting Fri 01/19/2017, Print         Clayton Bibles, PA-C 01/19/17 1452    Margette Fast, MD 01/19/17 2021

## 2017-01-19 NOTE — ED Notes (Signed)
ED Provider at bedside. 

## 2017-01-19 NOTE — Discharge Instructions (Signed)
Read the information below.  Use the prescribed medication as directed.  Please discuss all new medications with your pharmacist.  You may return to the Emergency Department at any time for worsening condition or any new symptoms that concern you.    If you develop fevers, loss of control of bowel or bladder, weakness or numbness in your legs, or are unable to walk, return to the ER for a recheck.     Your back pain is likely muscular.  Please try the prescribed medications for relief.    If you develop a rash in the area you have pain, please see your doctor right away.    If you develop chest pain or shortness of breath, call 911 or return to the Emergency Department for a recheck.

## 2017-01-19 NOTE — ED Triage Notes (Signed)
Pt in via [facility], per report pt woke up with R upper back & scapula pain, pt denies new injury, hx of stroke with baseline L sided deficits, no new neuro symptoms reported, pt denies taking blood thinners, A&O x4, answers questions appropriately

## 2017-01-22 ENCOUNTER — Telehealth: Payer: Self-pay | Admitting: Family Medicine

## 2017-01-22 NOTE — Telephone Encounter (Signed)
Pt calling to advise that he was in ED and prescribed some meds that the pharmacy told him were not covered by his insurance. Would like a call to discuss other med options. Please f/u with pt. Thank you.

## 2017-01-22 NOTE — Telephone Encounter (Signed)
I cannot assist with this, will forward to Dr. Adrian Blackwater

## 2017-02-01 MED ORDER — CYCLOBENZAPRINE HCL 10 MG PO TABS: 10.0000 mg | ORAL_TABLET | Freq: Three times a day (TID) | ORAL | 0 refills | Status: DC | PRN

## 2017-02-01 NOTE — Telephone Encounter (Signed)
Called patient verified name and DOB He reports he was not able to get robaxin and lidoderm patches He report back muscle spasm has improved. Sent in flexeril to have as needed   He ha f/u appt on 02/22/17

## 2017-02-07 DIAGNOSIS — Z1211 Encounter for screening for malignant neoplasm of colon: Secondary | ICD-10-CM | POA: Diagnosis not present

## 2017-02-22 ENCOUNTER — Ambulatory Visit: Payer: Medicare Other | Attending: Family Medicine | Admitting: Family Medicine

## 2017-02-22 ENCOUNTER — Encounter: Payer: Self-pay | Admitting: Family Medicine

## 2017-02-22 VITALS — BP 137/79 | HR 69 | Temp 98.2°F | Ht 72.0 in | Wt 258.8 lb

## 2017-02-22 DIAGNOSIS — M48062 Spinal stenosis, lumbar region with neurogenic claudication: Secondary | ICD-10-CM | POA: Diagnosis not present

## 2017-02-22 DIAGNOSIS — M7989 Other specified soft tissue disorders: Secondary | ICD-10-CM

## 2017-02-22 DIAGNOSIS — H6123 Impacted cerumen, bilateral: Secondary | ICD-10-CM | POA: Diagnosis not present

## 2017-02-22 DIAGNOSIS — R7989 Other specified abnormal findings of blood chemistry: Secondary | ICD-10-CM

## 2017-02-22 DIAGNOSIS — Z7984 Long term (current) use of oral hypoglycemic drugs: Secondary | ICD-10-CM | POA: Insufficient documentation

## 2017-02-22 DIAGNOSIS — E11628 Type 2 diabetes mellitus with other skin complications: Secondary | ICD-10-CM

## 2017-02-22 DIAGNOSIS — E118 Type 2 diabetes mellitus with unspecified complications: Secondary | ICD-10-CM

## 2017-02-22 DIAGNOSIS — I10 Essential (primary) hypertension: Secondary | ICD-10-CM | POA: Diagnosis not present

## 2017-02-22 DIAGNOSIS — Z79899 Other long term (current) drug therapy: Secondary | ICD-10-CM | POA: Diagnosis not present

## 2017-02-22 DIAGNOSIS — F17210 Nicotine dependence, cigarettes, uncomplicated: Secondary | ICD-10-CM | POA: Insufficient documentation

## 2017-02-22 DIAGNOSIS — G609 Hereditary and idiopathic neuropathy, unspecified: Secondary | ICD-10-CM

## 2017-02-22 DIAGNOSIS — L723 Sebaceous cyst: Secondary | ICD-10-CM | POA: Diagnosis not present

## 2017-02-22 DIAGNOSIS — R42 Dizziness and giddiness: Secondary | ICD-10-CM | POA: Diagnosis not present

## 2017-02-22 DIAGNOSIS — H6121 Impacted cerumen, right ear: Secondary | ICD-10-CM | POA: Diagnosis not present

## 2017-02-22 DIAGNOSIS — L84 Corns and callosities: Secondary | ICD-10-CM

## 2017-02-22 LAB — GLUCOSE, POCT (MANUAL RESULT ENTRY): POC GLUCOSE: 167 mg/dL — AB (ref 70–99)

## 2017-02-22 LAB — POCT GLYCOSYLATED HEMOGLOBIN (HGB A1C): Hemoglobin A1C: 6.8

## 2017-02-22 MED ORDER — GABAPENTIN 300 MG PO CAPS: ORAL_CAPSULE | ORAL | 3 refills | Status: DC

## 2017-02-22 MED ORDER — CLINDAMYCIN HCL 300 MG PO CAPS: 300.0000 mg | ORAL_CAPSULE | Freq: Two times a day (BID) | ORAL | 0 refills | Status: DC

## 2017-02-22 MED ORDER — CYCLOBENZAPRINE HCL 10 MG PO TABS: 10.0000 mg | ORAL_TABLET | Freq: Three times a day (TID) | ORAL | 0 refills | Status: DC | PRN

## 2017-02-22 NOTE — Assessment & Plan Note (Signed)
Controlled. Continue current regimen. 

## 2017-02-22 NOTE — Progress Notes (Signed)
Subjective:  Patient ID: [name], male    DOB: 08-Dec-1960  Age: 56 y.o. MRN: [id number]  CC: Hypertension and Diabetes   HPI [name] presents for    1. CHRONIC DIABETES: taking metformin 100 mg BID and glipizide 10 mg XL daily.   2. Hypertension:  taking HCTZ  25 mg daily, lisinopril 40 mg daily and Norvasc 5 mg daily.  No HA, CP or SOB. Slight leg swelling.   3. Left shoulder blade pain:  He never received his muscle relaxer. He reports mild pain. No injury. No rash.   4. Bump on scalp: has tender bump on Left parietal area. Drains blood and pus. Has stopped draining. Denies fever and chills.   5. Cerumen impaction: reports decreased hearing in both ears. No ear pain. Dizziness at times.   Social History  Substance Use Topics  . Smoking status: Current Every Day Smoker    Packs/day: 0.25    Types: Cigarettes  . Smokeless tobacco: Never Used  . Alcohol use 1.2 - 1.8 oz/week    2 - 3 Cans of beer per week     Comment: occassional    Outpatient Medications Prior to Visit  Medication Sig Dispense Refill  . amLODipine (NORVASC) 5 MG tablet Take 5 mg by mouth daily.    Marland Kitchen atorvastatin (LIPITOR) 40 MG tablet Take 1 tablet (40 mg total) by mouth daily. 90 tablet 3  . cyclobenzaprine (FLEXERIL) 10 MG tablet Take 1 tablet (10 mg total) by mouth 3 (three) times daily as needed for muscle spasms. 30 tablet 0  . gabapentin (NEURONTIN) 300 MG capsule TAKE 1 CAPSULE (300 MG TOTAL) BY MOUTH THREE TIMES DAILY. 270 capsule 0  . glipiZIDE (GLIPIZIDE XL) 10 MG 24 hr tablet Take 1 tablet (10 mg total) by mouth daily with breakfast. 90 tablet 3  . glucose monitoring kit (FREESTYLE) monitoring kit 1 each by Does not apply route as needed for other. Dispense any model that is covered- dispense testing supplies for Q AC/ HS accuchecks- 1 month supply with one refil. 1 each 1  . hydrochlorothiazide (HYDRODIURIL) 25 MG tablet Take 1 tablet (25 mg total) by mouth daily. (Patient taking  differently: Take 12.5 mg by mouth daily. ) 90 tablet 3  . lidocaine (LIDODERM) 5 % Place 1 patch onto the skin daily. Remove & Discard patch within 12 hours or as directed by MD 30 patch 0  . lisinopril (PRINIVIL,ZESTRIL) 40 MG tablet Take 1 tablet (40 mg total) by mouth daily. 90 tablet 3  . meloxicam (MOBIC) 7.5 MG tablet TAKE 1 TABLET (7.5 MG TOTAL) BY MOUTH DAILY AS NEEDED FOR PAIN. 30 tablet 2  . metFORMIN (GLUCOPHAGE) 1000 MG tablet Take 1 tablet (1,000 mg total) by mouth 2 (two) times daily with a meal. 180 tablet 3  . minocycline (MINOCIN,DYNACIN) 100 MG capsule Take 100 mg by mouth 2 (two) times daily.    . sildenafil (VIAGRA) 100 MG tablet Take 0.5-1 tablets (50-100 mg total) by mouth daily as needed for erectile dysfunction. 5 tablet 11   No facility-administered medications prior to visit.     ROS Review of Systems  Constitutional: Negative for chills, fatigue, fever and unexpected weight change.  HENT: Positive for hearing loss.   Eyes: Negative for visual disturbance.  Respiratory: Negative for cough and shortness of breath.   Cardiovascular: Negative for chest pain, palpitations and leg swelling.  Gastrointestinal: Negative for abdominal pain, blood in stool, constipation, diarrhea, nausea and  vomiting.  Endocrine: Negative for polydipsia, polyphagia and polyuria.  Musculoskeletal: Negative for arthralgias, back pain, gait problem, myalgias and neck pain.  Skin: Negative for rash and wound.  Allergic/Immunologic: Negative for immunocompromised state.  Neurological: Positive for dizziness, weakness (on L side since stroke ) and numbness (in Feet L >R).  Hematological: Negative for adenopathy. Does not bruise/bleed easily.  Psychiatric/Behavioral: Negative for dysphoric mood, sleep disturbance and suicidal ideas. The patient is not nervous/anxious.     Objective:  BP 137/79   Pulse 69   Temp 98.2 F (36.8 C) (Oral)   Ht 6' (1.829 m)   Wt 258 lb 12.8 oz (117.4 kg)    SpO2 98%   BMI 35.10 kg/m   BP/Weight 02/22/2017 01/19/2017 8/54/6270  Systolic BP 350 093 818  Diastolic BP 79 85 89  Wt. (Lbs) 258.8 257 -  BMI 35.1 32.12 -    Physical Exam  Constitutional: He appears well-developed and well-nourished. No distress.  HENT:  Head: Normocephalic and atraumatic.    Cerumen impaction both ears Removed via irrigation and manual debridement   Neck: Normal range of motion. Neck supple.  Cardiovascular: Normal rate, regular rhythm, normal heart sounds and intact distal pulses.   Pulmonary/Chest: Effort normal and breath sounds normal.  Musculoskeletal: He exhibits edema (left foot and leg ).  Neurological: He is alert.  Skin: Skin is warm and dry. No rash noted. No erythema.  Psychiatric: He has a normal mood and affect.   Incision and Drainage Procedure Note  Pre-operative Diagnosis: sebaceous cyst   Post-operative Diagnosis: same  Indications: relief of pain  Anesthesia: 1% lidocaine with epinephrine  Procedure Details  The procedure, risks and complications have been discussed in detail (including, but not limited to airway compromise, infection, bleeding) with the patient, and the patient has signed consent to the procedure.  The skin was sterilely prepped and draped over the affected area in the usual fashion. After adequate local anesthesia, I&D with a #11 blade was performed on the left temple. Purulent drainage: absent, only bloody drainage  The patient was observed until stable.    Lab Results  Component Value Date   HGBA1C 6.6 11/28/2016   Lab Results  Component Value Date   HGBA1C 6.8 02/22/2017    CBG 167   ABI  Left 1.23 Right 1.24  Assessment & Plan:   [name] was seen today for hypertension and diabetes.  Diagnoses and all orders for this visit:  Type 2 diabetes mellitus with complication, without long-term current use of insulin (HCC) -     POCT glucose (manual entry) -     HgB A1c  Left leg swelling -      D-dimer, quantitative (not at [facility]) -     CBC -     POCT ABI Screening for Pilot No Charge  Sebaceous cyst -     clindamycin (CLEOCIN) 300 MG capsule; Take 1 capsule (300 mg total) by mouth 2 (two) times daily.  Bilateral impacted cerumen  Idiopathic peripheral neuropathy  Spinal stenosis of lumbar region with neurogenic claudication -     gabapentin (NEURONTIN) 300 MG capsule; TAKE 1 CAPSULE (300 MG TOTAL) BY MOUTH THREE TIMES DAILY.  Type 2 diabetes mellitus with pressure callus (HCC) -     Ambulatory referral to Podiatry  Essential hypertension  Hearing loss due to cerumen impaction, right  Other orders -     cyclobenzaprine (FLEXERIL) 10 MG tablet; Take 1 tablet (10 mg total) by mouth 3 (three)  times daily as needed for muscle spasms.   No orders of the defined types were placed in this encounter.   Follow-up: Return in about 3 weeks (around 03/15/2017) for left leg swelling .   Boykin Nearing MD

## 2017-02-22 NOTE — Patient Instructions (Addendum)
[name] was seen today for hypertension and diabetes.  Diagnoses and all orders for this visit:  Type 2 diabetes mellitus with complication, without long-term current use of insulin (HCC) -     POCT glucose (manual entry) -     HgB A1c  Left leg swelling -     D-dimer, quantitative (not at [facility]) -     CBC -     POCT ABI Screening for Pilot No Charge  Sebaceous cyst -     clindamycin (CLEOCIN) 300 MG capsule; Take 1 capsule (300 mg total) by mouth 2 (two) times daily.  Bilateral impacted cerumen  Idiopathic peripheral neuropathy  Spinal stenosis of lumbar region with neurogenic claudication -     gabapentin (NEURONTIN) 300 MG capsule; TAKE 1 CAPSULE (300 MG TOTAL) BY MOUTH THREE TIMES DAILY.  Type 2 diabetes mellitus with pressure callus (HCC) -     Ambulatory referral to Podiatry  Other orders -     cyclobenzaprine (FLEXERIL) 10 MG tablet; Take 1 tablet (10 mg total) by mouth 3 (three) times daily as needed for muscle spasms.  referral to podiatry placed for callus on L foot ABI is normal today you can wear compression stockings   Elevate leg above heart when resting You will be called with lab results CBC and D dimer  If D dimer elevated you will need vascular ultrasound   Please stop norvasc 5 mg daily for 4 days if swelling improves than it is a side effect of the norvasc, please call with update   F/u in 3 weeks for Left leg swelling   Dr. Adrian Blackwater

## 2017-02-22 NOTE — Assessment & Plan Note (Signed)
A: well controlled P: Continue current regimen

## 2017-02-22 NOTE — Assessment & Plan Note (Signed)
recurrent scalp cyst I&D done today Clindamycin course prescribed

## 2017-02-22 NOTE — Assessment & Plan Note (Signed)
Impacted cerumen removed

## 2017-02-22 NOTE — Assessment & Plan Note (Addendum)
A: leg left swelling returned after restarting Norvasc. Normal ABI.  P: Compression  eelevation  CBC and D dimer Will likely need to stop norvasc as this has occurred before   Normal CBC, elevated D Dimer Venous ultrasound of L leg ordered

## 2017-02-23 ENCOUNTER — Telehealth: Payer: Self-pay

## 2017-02-23 LAB — CBC
HEMOGLOBIN: 14.7 g/dL (ref 13.0–17.7)
Hematocrit: 43.6 % (ref 37.5–51.0)
MCH: 28.2 pg (ref 26.6–33.0)
MCHC: 33.7 g/dL (ref 31.5–35.7)
MCV: 84 fL (ref 79–97)
Platelets: 228 10*3/uL (ref 150–379)
RBC: 5.22 x10E6/uL (ref 4.14–5.80)
RDW: 13.6 % (ref 12.3–15.4)
WBC: 7.7 10*3/uL (ref 3.4–10.8)

## 2017-02-23 LAB — D-DIMER, QUANTITATIVE: D-DIMER: 0.6 mg/L FEU — ABNORMAL HIGH (ref 0.00–0.49)

## 2017-02-23 NOTE — Telephone Encounter (Signed)
Pt was called and informed of lab results and doppler appointment.

## 2017-02-23 NOTE — Addendum Note (Signed)
Addended by: Boykin Nearing on: 02/23/2017 08:47 AM   Modules accepted: Orders

## 2017-02-26 ENCOUNTER — Ambulatory Visit (HOSPITAL_COMMUNITY)
Admission: RE | Admit: 2017-02-26 | Discharge: 2017-02-26 | Disposition: A | Discharge status: Home / Self Care | Payer: Medicare Other | Source: Ambulatory Visit | Attending: Family Medicine | Admitting: Family Medicine

## 2017-02-26 DIAGNOSIS — M7989 Other specified soft tissue disorders: Secondary | ICD-10-CM

## 2017-02-26 DIAGNOSIS — R590 Localized enlarged lymph nodes: Secondary | ICD-10-CM | POA: Insufficient documentation

## 2017-02-26 DIAGNOSIS — R7989 Other specified abnormal findings of blood chemistry: Secondary | ICD-10-CM | POA: Diagnosis not present

## 2017-02-26 NOTE — Progress Notes (Addendum)
**  Preliminary report by tech**  Left lower extremity venous duplex complete. There is no evidence of deep or superficial vein thrombosis involving the left lower extremity. All visualized vessels appear patent and compressible. There is no evidence of a Baker's cyst on the left. Incidental findings are consistent with an enlarged lymph node measuring 1.5 cm high by 2.1 cm wide by 4.7 cm long on the left. Results were given to [place] at Dr. Adrian Blackwater' office.  02/26/17 9:42 AM Carlos Levering RVT

## 2017-03-01 ENCOUNTER — Telehealth: Payer: Self-pay

## 2017-03-01 NOTE — Telephone Encounter (Signed)
Pt was called and informed of lab results and to take all of medication

## 2017-03-09 DIAGNOSIS — F4321 Adjustment disorder with depressed mood: Secondary | ICD-10-CM | POA: Diagnosis not present

## 2017-03-13 ENCOUNTER — Ambulatory Visit: Payer: Medicare Other | Attending: Internal Medicine | Admitting: Internal Medicine

## 2017-03-13 ENCOUNTER — Encounter: Payer: Self-pay | Admitting: Internal Medicine

## 2017-03-13 VITALS — BP 124/81 | HR 70 | Temp 98.5°F | Resp 20 | Ht 75.0 in | Wt 253.6 lb

## 2017-03-13 DIAGNOSIS — M48061 Spinal stenosis, lumbar region without neurogenic claudication: Secondary | ICD-10-CM | POA: Diagnosis not present

## 2017-03-13 DIAGNOSIS — H35033 Hypertensive retinopathy, bilateral: Secondary | ICD-10-CM | POA: Insufficient documentation

## 2017-03-13 DIAGNOSIS — R634 Abnormal weight loss: Secondary | ICD-10-CM

## 2017-03-13 DIAGNOSIS — Z8673 Personal history of transient ischemic attack (TIA), and cerebral infarction without residual deficits: Secondary | ICD-10-CM | POA: Insufficient documentation

## 2017-03-13 DIAGNOSIS — E113299 Type 2 diabetes mellitus with mild nonproliferative diabetic retinopathy without macular edema, unspecified eye: Secondary | ICD-10-CM | POA: Insufficient documentation

## 2017-03-13 DIAGNOSIS — F1410 Cocaine abuse, uncomplicated: Secondary | ICD-10-CM

## 2017-03-13 DIAGNOSIS — E118 Type 2 diabetes mellitus with unspecified complications: Secondary | ICD-10-CM | POA: Diagnosis not present

## 2017-03-13 DIAGNOSIS — K5900 Constipation, unspecified: Secondary | ICD-10-CM | POA: Diagnosis not present

## 2017-03-13 DIAGNOSIS — I10 Essential (primary) hypertension: Secondary | ICD-10-CM

## 2017-03-13 DIAGNOSIS — F17210 Nicotine dependence, cigarettes, uncomplicated: Secondary | ICD-10-CM | POA: Diagnosis not present

## 2017-03-13 DIAGNOSIS — L84 Corns and callosities: Secondary | ICD-10-CM | POA: Diagnosis not present

## 2017-03-13 DIAGNOSIS — Z803 Family history of malignant neoplasm of breast: Secondary | ICD-10-CM | POA: Insufficient documentation

## 2017-03-13 DIAGNOSIS — Z716 Tobacco abuse counseling: Secondary | ICD-10-CM | POA: Diagnosis not present

## 2017-03-13 DIAGNOSIS — L723 Sebaceous cyst: Secondary | ICD-10-CM | POA: Insufficient documentation

## 2017-03-13 DIAGNOSIS — Z79899 Other long term (current) drug therapy: Secondary | ICD-10-CM | POA: Insufficient documentation

## 2017-03-13 DIAGNOSIS — Z823 Family history of stroke: Secondary | ICD-10-CM | POA: Insufficient documentation

## 2017-03-13 DIAGNOSIS — Z7984 Long term (current) use of oral hypoglycemic drugs: Secondary | ICD-10-CM | POA: Insufficient documentation

## 2017-03-13 DIAGNOSIS — Z114 Encounter for screening for human immunodeficiency virus [HIV]: Secondary | ICD-10-CM

## 2017-03-13 DIAGNOSIS — Z833 Family history of diabetes mellitus: Secondary | ICD-10-CM | POA: Insufficient documentation

## 2017-03-13 DIAGNOSIS — E1142 Type 2 diabetes mellitus with diabetic polyneuropathy: Secondary | ICD-10-CM | POA: Insufficient documentation

## 2017-03-13 DIAGNOSIS — B353 Tinea pedis: Secondary | ICD-10-CM | POA: Insufficient documentation

## 2017-03-13 DIAGNOSIS — E785 Hyperlipidemia, unspecified: Secondary | ICD-10-CM | POA: Diagnosis not present

## 2017-03-13 DIAGNOSIS — M7989 Other specified soft tissue disorders: Secondary | ICD-10-CM | POA: Diagnosis not present

## 2017-03-13 DIAGNOSIS — G919 Hydrocephalus, unspecified: Secondary | ICD-10-CM | POA: Diagnosis not present

## 2017-03-13 DIAGNOSIS — Z7982 Long term (current) use of aspirin: Secondary | ICD-10-CM | POA: Insufficient documentation

## 2017-03-13 DIAGNOSIS — Z9889 Other specified postprocedural states: Secondary | ICD-10-CM | POA: Diagnosis not present

## 2017-03-13 DIAGNOSIS — E669 Obesity, unspecified: Secondary | ICD-10-CM | POA: Insufficient documentation

## 2017-03-13 DIAGNOSIS — Z8249 Family history of ischemic heart disease and other diseases of the circulatory system: Secondary | ICD-10-CM | POA: Insufficient documentation

## 2017-03-13 LAB — GLUCOSE, POCT (MANUAL RESULT ENTRY): POC Glucose: 135 mg/dl — AB (ref 70–99)

## 2017-03-13 LAB — POCT GLYCOSYLATED HEMOGLOBIN (HGB A1C): Hemoglobin A1C: 6.8

## 2017-03-13 MED ORDER — ASPIRIN EC 81 MG PO TBEC: 81.0000 mg | DELAYED_RELEASE_TABLET | Freq: Every day | ORAL | 0 refills | Status: DC

## 2017-03-13 NOTE — Progress Notes (Signed)
Patient ID: [name], male    DOB: May 12, 1961  MRN: [id number]  CC: No chief complaint on file.   Subjective: [name] is a 56 y.o. male who presents for to establish care with me as PCP. Last saw Dr. Adrian Blackwater earlier this month His concerns today include:  56 year old male with history of diabetes type 2, HTN, tobacco dependence, HL, CVA (about 3 yrs ago per pt), spinal stenosis of lumbar spine  1. Infected Cyst left temple - healing well post I&D on last visit  2. LT leg swelling: had neg venous doppler; large lymph node was noted but radiologist did not give the location of the lymph node.  Swelling thought to be due Norvasc which he is still taking -past 5 yrs LT lower leg has been larger than RT but swelling intermittent.  -reports podiatrist advised against compression stockings; cuts off circulation. Last seen 2 mths ago -no PND/orthopnea/SOB at rest.  3. DM with retinopathy: Compliant with Metformin and Glucotrol -checks BS 1-2 x a wk.  Range 120s. No recent hypoglycemia -does his own cooking but mostly heats up dinners in microwave -not getting in much exercise other than walking in his house  -due for eye exam with Dr. Katy Fitch. + blurred vision up close. Wears reading glasses for this. Does not drive  4. Tob dep:  -smokes cigars 3 x a wk.  1 pk of cigarettes last 1.5 wks.  -he has cut back. Had quit for 2 yrs in past. Wants to quit and feels he will get there -not interested in patches, Chantix or Buproprion  5. ETOH: drinks 1-2 beers on the 1st of the mth. "This is when I have a little change available." -using cocaine every other mth.   6. In looking over his chart patient has had some weight loss over the past year. He reports appetite has been okay. No chronic cough, no blood in the stools. Moving bowels okay. No blood in the stools. No fever or night sweats.  Patient Active Problem List   Diagnosis Date Noted  . Left leg swelling 03/23/2016  .  Sebaceous cyst 02/28/2016  . Mild nonproliferative diabetic retinopathy ([place]) 01/10/2016  . Hypertensive retinopathy of both eyes 01/10/2016  . Spinal stenosis of lumbar region 07/15/2015  . Abnormality of gait 06/07/2015  . Hydrocephalus 06/07/2015  . Peripheral neuropathy 04/26/2015  . Constipation 04/26/2015  . Diabetes mellitus type 2 with complications ([place]) 32/67/1245  . Decreased hearing of both ears 03/15/2015  . Tinea pedis 03/15/2015  . Callus of foot 05/22/2014  . 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 Coronary vasospasm, cocaine related 12/29/2013  . Cocaine abuse 12/28/2013  . Alcoholism /alcohol abuse ([place]) 12/28/2013  . Left-sided weakness 12/26/2013  . Hyperlipidemia associated with type 2 diabetes mellitus ([place]) 04/01/2007  . OBESITY NOS 03/28/2007  . Essential hypertension 03/28/2007     Current Outpatient Prescriptions on File Prior to Visit  Medication Sig Dispense Refill  . atorvastatin (LIPITOR) 40 MG tablet Take 1 tablet (40 mg total) by mouth daily. 90 tablet 3  . cyclobenzaprine (FLEXERIL) 10 MG tablet Take 1 tablet (10 mg total) by mouth 3 (three) times daily as needed for muscle spasms. 30 tablet 0  . gabapentin (NEURONTIN) 300 MG capsule TAKE 1 CAPSULE (300 MG TOTAL) BY MOUTH THREE TIMES DAILY. 270 capsule 3  . glipiZIDE (GLIPIZIDE XL) 10 MG 24 hr tablet Take 1 tablet (10 mg total) by mouth daily with breakfast.  90 tablet 3  . glucose monitoring kit (FREESTYLE) monitoring kit 1 each by Does not apply route as needed for other. Dispense any model that is covered- dispense testing supplies for Q AC/ HS accuchecks- 1 month supply with one refil. 1 each 1  . lisinopril (PRINIVIL,ZESTRIL) 40 MG tablet Take 1 tablet (40 mg total) by mouth daily. 90 tablet 3  . metFORMIN (GLUCOPHAGE) 1000 MG tablet Take 1 tablet (1,000 mg total) by mouth 2 (two) times daily with a meal. 180 tablet 3  . hydrochlorothiazide (HYDRODIURIL) 25 MG tablet Take 1 tablet (25 mg  total) by mouth daily. (Patient not taking: Reported on 03/13/2017) 90 tablet 3  . meloxicam (MOBIC) 7.5 MG tablet TAKE 1 TABLET (7.5 MG TOTAL) BY MOUTH DAILY AS NEEDED FOR PAIN. (Patient not taking: Reported on 03/13/2017) 30 tablet 2  . sildenafil (VIAGRA) 100 MG tablet Take 0.5-1 tablets (50-100 mg total) by mouth daily as needed for erectile dysfunction. (Patient not taking: Reported on 02/22/2017) 5 tablet 11   No current facility-administered medications on file prior to visit.     No Known Allergies  Social History   Social History  . Marital status: Divorced    Spouse name: N/A  . Number of children: 0  . Years of education: 10   Occupational History  . Unemployed    Social History Main Topics  . Smoking status: Current Every Day Smoker    Packs/day: 0.25    Types: Cigarettes  . Smokeless tobacco: Never Used  . Alcohol use 1.2 - 1.8 oz/week    2 - 3 Cans of beer per week     Comment: occassional  . Drug use: Yes    Types: "Crack" cocaine, Cocaine     Comment: once a month  . Sexual activity: Not on file   Other Topics Concern  . Not on file   Social History Narrative   Lives at home alone.   Right-handed.   Occasional use.    Family History  Problem Relation Age of Onset  . Hypertension Mother   . Diabetes Mother   . Cancer Mother        breast cancer   . Hypertension Father   . Heart disease Father   . Stroke Father   . Hypertension Sister     Past Surgical History:  Procedure Laterality Date  . CARDIAC CATHETERIZATION  12/27/2013  . CARDIAC CATHETERIZATION  12/27/2013   Impression: 1. 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 Moderate disease in the small caliber intermediate branch, 3. Normal LV systolic function  . left arm skin graft    . LEFT HEART CATHETERIZATION WITH CORONARY ANGIOGRAM Bilateral 12/26/2013   Procedure: LEFT HEART CATHETERIZATION WITH CORONARY ANGIOGRAM;  Surgeon: Burnell Blanks, MD;  Location: [facility] CATH LAB;  Service: Cardiovascular;   Laterality: Bilateral;    ROS: Review of Systems Negative except as stated above  PHYSICAL EXAM: BP 124/81 (BP Location: Left Arm, Patient Position: Sitting, Cuff Size: Large)   Pulse 70   Temp 98.5 F (36.9 C) (Oral)   Resp 20   Ht '6\' 3"'  (1.905 m)   Wt 253 lb 9.6 oz (115 kg)   SpO2 99%   BMI 31.70 kg/m   Wt Readings from Last 3 Encounters:  03/13/17 253 lb 9.6 oz (115 kg)  02/22/17 258 lb 12.8 oz (117.4 kg)  01/19/17 257 lb (116.6 kg)  wgh 267 lbs 1 yr ago  Physical Exam General appearance - alert, well appearing,  African-American male who looks older than stated age and in no distress Mental status - flat affect. Answers questions appropriately Eyes - pupils equal and reactive, extraocular eye movements intact Mouth - mucous membranes moist, pharynx normal without lesions Neck - supple, no supraclavicular, cervical or axillary lymphadenopathy. pea size lymphadenopathy felt in the left groin Chest - clear to auscultation, no wheezes, rales or rhonchi, symmetric air entry Heart - normal rate, regular rhythm, normal S1, S2, no murmurs, rubs, clicks or gallops Extremities -trace pitting edema of LEs LT>RT. some hypopigmentation to both lower legs and ankles suggesting venous stasis. Dorsalis pedis and posterior tibialis pulses 3+ bilaterally Diabetic Foot Exam - Simple   Simple Foot Form Visual Inspection See comments:  Yes Sensation Testing See comments:  Yes Pulse Check Posterior Tibialis and Dorsalis pulse intact bilaterally:  Yes Comments Preoperative callus noted on sole of the left big toe. Toenails are thickened; toenails on both big toes are overgrown. Decreased sensation to touch on both soles on LEEP exam.      Results for orders placed or performed in visit on 03/13/17  HgB A1c  Result Value Ref Range   Hemoglobin A1C 6.8   Glucose (CBG)  Result Value Ref Range   POC Glucose 135 (A) 70 - 99 mg/dl   Depression screen [facility] 2/9 03/13/2017 02/22/2017 11/28/2016  05/10/2016 03/23/2016  Decreased Interest '1 1 1 1 1  ' Down, Depressed, Hopeless 1 1 0 0 0  PHQ - 2 Score '2 2 1 1 1  ' Altered sleeping 0 0 0 0 0  Tired, decreased energy 1 1 0 2 1  Change in appetite 1 0 0 0 1  Feeling bad or failure about yourself  0 0 0 0 0  Trouble concentrating 0 1 0 0 0  Moving slowly or fidgety/restless 0 0 0 0 1  Suicidal thoughts - 0 0 0 0  PHQ-9 Score '4 4 1 3 4    ' ASSESSMENT AND PLAN: 1. Type 2 diabetes mellitus with complication, without long-term current use of insulin (HCC) At goal. Continue glipizide and metformin. - HgB A1c - Glucose (CBG) - aspirin EC 81 MG tablet; Take 1 tablet (81 mg total) by mouth daily.  Dispense: 100 tablet; Refill: 0 - Ambulatory referral to Ophthalmology  2. Essential hypertension -At goal. Continue lisinopril and HCTZ. Given the report of swelling in the past being caused by Norvasc we will give a trial of stopping the medication. He will come back in a few weeks for blood pressure check by our clinical pharmacists  3. Cocaine abuse Encourage abstinence. Is not interested in joining a rehabilitation group  4. Tobacco abuse counseling Advised to quit. Patient feels he will be able to do it on his own. Declines nicotine replacement therapy, Chantix or bupropion  5. Pre-ulcerative corn or callous -Patient has appointment with his podiatrist later this month. -Advised on proper diabetic foot care  6. Left leg swelling -May be partially due to venous stasis. Given that he has good pulses in the feet I think it is okay for him to wear the compression socks. -We will have him hold Norvasc. He had told my MA that he is not taking HCTZ but I have told him that he needs to take this.  7. Weight loss - TSH  8. Screening for HIV (human immunodeficiency virus) - HIV antibody  Patient was given the opportunity to ask questions.  Patient verbalized understanding of the plan and was able to repeat key  elements of the plan.    Orders Placed This Encounter  Procedures  . TSH  . HIV antibody  . Ambulatory referral to Ophthalmology  . HgB A1c  . Glucose (CBG)     Requested Prescriptions   Signed Prescriptions Disp Refills  . aspirin EC 81 MG tablet 100 tablet 0    Sig: Take 1 tablet (81 mg total) by mouth daily.    Return in about 2 months (around 05/13/2017).  Karle Plumber, MD, FACP

## 2017-03-13 NOTE — Progress Notes (Signed)
ESTABLISH CARE: F/U dm/ htn

## 2017-03-13 NOTE — Patient Instructions (Addendum)
Give appointment with Stacy in 2 weeks for repeat BP check.   Stop Norvasc.  Okay to use compression stockings on left leg.   Continue to cut down on smoking.  Stop the cocaine use.   Take a baby Aspirin daily.  Keep appointment with podiatry later this month.

## 2017-03-14 LAB — TSH: TSH: 2.2 u[IU]/mL (ref 0.450–4.500)

## 2017-03-14 LAB — HIV ANTIBODY (ROUTINE TESTING W REFLEX): HIV Screen 4th Generation wRfx: NONREACTIVE

## 2017-03-20 ENCOUNTER — Encounter: Payer: Self-pay | Admitting: Podiatry

## 2017-03-20 ENCOUNTER — Ambulatory Visit (INDEPENDENT_AMBULATORY_CARE_PROVIDER_SITE_OTHER): Payer: Medicare Other | Admitting: Podiatry

## 2017-03-20 DIAGNOSIS — E1142 Type 2 diabetes mellitus with diabetic polyneuropathy: Secondary | ICD-10-CM

## 2017-03-20 DIAGNOSIS — M79676 Pain in unspecified toe(s): Secondary | ICD-10-CM

## 2017-03-20 DIAGNOSIS — L97521 Non-pressure chronic ulcer of other part of left foot limited to breakdown of skin: Secondary | ICD-10-CM | POA: Diagnosis not present

## 2017-03-20 DIAGNOSIS — B351 Tinea unguium: Secondary | ICD-10-CM | POA: Diagnosis not present

## 2017-03-20 MED ORDER — SILVER SULFADIAZINE 1 % EX CREA: 1.0000 "application " | TOPICAL_CREAM | Freq: Every day | CUTANEOUS | 0 refills | Status: DC

## 2017-03-20 NOTE — Patient Instructions (Signed)
I Silvadene cream to the skin ulcer on the bilateral left great toe daily and cover with a Band-Aid Wear an athletic style shoe Tany sudden increase in pain, swelling, redness, fever presents emergency department Diabetes and Foot Care Diabetes may cause you to have problems because of poor blood supply (circulation) to your feet and legs. This may cause the skin on your feet to become thinner, break easier, and heal more slowly. Your skin may become dry, and the skin may peel and crack. You may also have nerve damage in your legs and feet causing decreased feeling in them. You may not notice minor injuries to your feet that could lead to infections or more serious problems. Taking care of your feet is one of the most important things you can do for yourself. Follow these instructions at home:  Wear shoes at all times, even in the house. Do not go barefoot. Bare feet are easily injured.  Check your feet daily for blisters, cuts, and redness. If you cannot see the bottom of your feet, use a mirror or ask someone for help.  Wash your feet with warm water (do not use hot water) and mild soap. Then pat your feet and the areas between your toes until they are completely dry. Do not soak your feet as this can dry your skin.  Apply a moisturizing lotion or petroleum jelly (that does not contain alcohol and is unscented) to the skin on your feet and to dry, brittle toenails. Do not apply lotion between your toes.  Trim your toenails straight across. Do not dig under them or around the cuticle. File the edges of your nails with an emery board or nail file.  Do not cut corns or calluses or try to remove them with medicine.  Wear clean socks or stockings every day. Make sure they are not too tight. Do not wear knee-high stockings since they may decrease blood flow to your legs.  Wear shoes that fit properly and have enough cushioning. To break in new shoes, wear them for just a few hours a day. This  prevents you from injuring your feet. Always look in your shoes before you put them on to be sure there are no objects inside.  Do not cross your legs. This may decrease the blood flow to your feet.  If you find a minor scrape, cut, or break in the skin on your feet, keep it and the skin around it clean and dry. These areas may be cleansed with mild soap and water. Do not cleanse the area with peroxide, alcohol, or iodine.  When you remove an adhesive bandage, be sure not to damage the skin around it.  If you have a wound, look at it several times a day to make sure it is healing.  Do not use heating pads or hot water bottles. They may burn your skin. If you have lost feeling in your feet or legs, you may not know it is happening until it is too late.  Make sure your health care provider performs a complete foot exam at least annually or more often if you have foot problems. Report any cuts, sores, or bruises to your health care provider immediately. Contact a health care provider if:  You have an injury that is not healing.  You have cuts or breaks in the skin.  You have an ingrown nail.  You notice redness on your legs or feet.  You feel burning or tingling in your legs  or feet.  You have pain or cramps in your legs and feet.  Your legs or feet are numb.  Your feet always feel cold. Get help right away if:  There is increasing redness, swelling, or pain in or around a wound.  There is a red line that goes up your leg.  Pus is coming from a wound.  You develop a fever or as directed by your health care provider.  You notice a bad smell coming from an ulcer or wound. This information is not intended to replace advice given to you by your health care provider. Make sure you discuss any questions you have with your health care provider. Document Released: 07/07/2000 Document Revised: 12/16/2015 Document Reviewed: 12/17/2012 Elsevier Interactive Patient Education  2017  [employer].

## 2017-03-20 NOTE — Progress Notes (Signed)
Patient ID: [name], male   DOB: Apr 27, 1961, 56 y.o.   MRN: [id number]    This patient presents for a scheduled visit complaining of thick and elongated toenails which are uncomfortable walking wearing shoes as well as a painful callus on the plantar aspect of the left hallux. He is requesting debridement of the nails as well as a callus. He admits that he does not wear shoes at all times at home   Objective: Peripheral pitting edema bilaterally DP and PT pulses 1/4 bilaterally Capillary reflex immediate bilaterally Sensation to 10 g monofilament wire intact 0/5 right and 2/5 left Vibratory sensation nonreactive bilaterally No open skin lesions bilaterally The toenails are elongated, incurvated, discolored, deformed, hypertrophic and tender to direct palpation 6-10 Large bleeding callus sub-left hallux that has a superficial ulcer measuring 5 x 1 mm with a granular base Slow unsteady gait requiring roller walker  Assessment: Symptomatic onychomycoses 6-10 Superficial skin ulcer plantar left hallux without clinical sign of infection Type II diabetic with diminished pedal 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

## 2017-03-27 ENCOUNTER — Ambulatory Visit: Payer: Medicare Other | Attending: Internal Medicine | Admitting: Pharmacist

## 2017-03-27 VITALS — BP 138/86 | HR 71

## 2017-03-27 DIAGNOSIS — I10 Essential (primary) hypertension: Secondary | ICD-10-CM | POA: Diagnosis not present

## 2017-03-27 DIAGNOSIS — Z048 Encounter for examination and observation for other specified reasons: Secondary | ICD-10-CM | POA: Insufficient documentation

## 2017-03-27 NOTE — Patient Instructions (Addendum)
Thanks for coming to see [name]  Follow up with Dr. Wynetta Emery in November for 3 month follow up.

## 2017-03-27 NOTE — Progress Notes (Signed)
   S:    Patient arrives in good spirits.  Presents to the clinic for hypertension evaluation. Patient was referred on 03/13/17.  Patient was last seen by Primary Care Provider on 03/13/17.   Patient reports adherence with medications.  Current BP Medications include:  Lisinopril 40 mg daily, HCTZ 25 mg daily   Antihypertensives tried in the past include: amlodipine (swelling in left leg). He reports that he still has "10%" swelling left in his leg but that it has improved since stopping the amlodipine   O:   Last 3 Office BP readings: BP Readings from Last 3 Encounters:  03/27/17 139/83  03/13/17 124/81  02/22/17 137/79    BMET    Component Value Date/Time   NA 138 01/19/2017 1153   K 4.8 01/19/2017 1153   CL 106 01/19/2017 1153   CO2 25 01/19/2017 1153   GLUCOSE 80 01/19/2017 1153   BUN 16 01/19/2017 1153   CREATININE 1.10 01/19/2017 1153   CREATININE 0.89 06/24/2015 0905   CALCIUM 9.7 01/19/2017 1153   GFRNONAA >60 01/19/2017 1153   GFRAA >60 01/19/2017 1153    A/P: Hypertension longstanding currently <140/90 (listed as goal in chart) on current medications. Continue lisinopril 40 mg daily, HCTZ 25 mg daily. Amlodipine added as intolerance to allergy list but could trial a lower dose in the future if needed.  Results reviewed and written information provided.   Total time in face-to-face counseling 10 minutes.   F/U Clinic Visit with Dr. Wynetta Emery as directed.  Patient seen with Waverly Ferrari, PharmD Candidate

## 2017-04-03 ENCOUNTER — Encounter: Payer: Self-pay | Admitting: Podiatry

## 2017-04-03 ENCOUNTER — Ambulatory Visit (INDEPENDENT_AMBULATORY_CARE_PROVIDER_SITE_OTHER): Payer: Medicare Other | Admitting: Podiatry

## 2017-04-03 VITALS — BP 192/100 | HR 68 | Temp 97.5°F

## 2017-04-03 DIAGNOSIS — L84 Corns and callosities: Secondary | ICD-10-CM | POA: Diagnosis not present

## 2017-04-03 DIAGNOSIS — E1142 Type 2 diabetes mellitus with diabetic polyneuropathy: Secondary | ICD-10-CM

## 2017-04-03 NOTE — Progress Notes (Signed)
Patient ID: [name], male   DOB: 27-Jan-1961, 56 y.o.   MRN: [id number]   Subjective: This patient presents today for follow-up visit of 03/20/2017 for superficial ulcer on the plantar left hallux. Patient currently applying Silvadene to the area and covering with gauze or Band-Aid. Today he states he's noticed no drainage   Objective: Peripheral pitting edema bilaterally DP and PT pulses 1/4 bilaterally Capillary reflex immediate bilaterally Sensation to 10 g monofilament wire intact 0/5 right and 2/5 left Vibratory sensation nonreactive bilaterally No open skin lesions bilaterally The toenails are hypertrophic and discolored and were debrided on the visit of the 28th 2018 Large callus was dried blood sub-left hallux that remains closed after debridement. There is no surrounding erythema, edema, drainage, warmth Slow unsteady gait requiring roller walker  Assessment: Closed superficial ulcer plantar left hallux Pre-ulcerative plantar callus left hallux Superficial skin ulcer plantar left hallux without clinical sign of infection Type II diabetic with diminished pedal pulses Type II diabetic with peripheral neuropathy  Plan: Debrided pre-ulcerative plantar callus without any bleeding Instructed patient apply Vaseline and a Band-Aid to this area daily 5 days Wear athletic style shoe  Reappoint 10 weeks for skin a nail debridement

## 2017-04-03 NOTE — Patient Instructions (Signed)
Discontinue the use of the Silvadene cream Apply Vaseline and Band-Aid to the callus on the bottom of the left great toe daily 5 days Wear athletic thick sole shoes  Diabetes and Foot Care Diabetes may cause you to have problems because of poor blood supply (circulation) to your feet and legs. This may cause the skin on your feet to become thinner, break easier, and heal more slowly. Your skin may become dry, and the skin may peel and crack. You may also have nerve damage in your legs and feet causing decreased feeling in them. You may not notice minor injuries to your feet that could lead to infections or more serious problems. Taking care of your feet is one of the most important things you can do for yourself. Follow these instructions at home:  Wear shoes at all times, even in the house. Do not go barefoot. Bare feet are easily injured.  Check your feet daily for blisters, cuts, and redness. If you cannot see the bottom of your feet, use a mirror or ask someone for help.  Wash your feet with warm water (do not use hot water) and mild soap. Then pat your feet and the areas between your toes until they are completely dry. Do not soak your feet as this can dry your skin.  Apply a moisturizing lotion or petroleum jelly (that does not contain alcohol and is unscented) to the skin on your feet and to dry, brittle toenails. Do not apply lotion between your toes.  Trim your toenails straight across. Do not dig under them or around the cuticle. File the edges of your nails with an emery board or nail file.  Do not cut corns or calluses or try to remove them with medicine.  Wear clean socks or stockings every day. Make sure they are not too tight. Do not wear knee-high stockings since they may decrease blood flow to your legs.  Wear shoes that fit properly and have enough cushioning. To break in new shoes, wear them for just a few hours a day. This prevents you from injuring your feet. Always look  in your shoes before you put them on to be sure there are no objects inside.  Do not cross your legs. This may decrease the blood flow to your feet.  If you find a minor scrape, cut, or break in the skin on your feet, keep it and the skin around it clean and dry. These areas may be cleansed with mild soap and water. Do not cleanse the area with peroxide, alcohol, or iodine.  When you remove an adhesive bandage, be sure not to damage the skin around it.  If you have a wound, look at it several times a day to make sure it is healing.  Do not use heating pads or hot water bottles. They may burn your skin. If you have lost feeling in your feet or legs, you may not know it is happening until it is too late.  Make sure your health care provider performs a complete foot exam at least annually or more often if you have foot problems. Report any cuts, sores, or bruises to your health care provider immediately. Contact a health care provider if:  You have an injury that is not healing.  You have cuts or breaks in the skin.  You have an ingrown nail.  You notice redness on your legs or feet.  You feel burning or tingling in your legs or feet.  You  have pain or cramps in your legs and feet.  Your legs or feet are numb.  Your feet always feel cold. Get help right away if:  There is increasing redness, swelling, or pain in or around a wound.  There is a red line that goes up your leg.  Pus is coming from a wound.  You develop a fever or as directed by your health care provider.  You notice a bad smell coming from an ulcer or wound. This information is not intended to replace advice given to you by your health care provider. Make sure you discuss any questions you have with your health care provider. Document Released: 07/07/2000 Document Revised: 12/16/2015 Document Reviewed: 12/17/2012 Elsevier Interactive Patient Education  2017 [employer].

## 2017-04-19 LAB — POCT ABI - SCREENING FOR PILOT NO CHARGE

## 2017-05-08 DIAGNOSIS — E113393 Type 2 diabetes mellitus with moderate nonproliferative diabetic retinopathy without macular edema, bilateral: Secondary | ICD-10-CM | POA: Diagnosis not present

## 2017-05-08 DIAGNOSIS — H2513 Age-related nuclear cataract, bilateral: Secondary | ICD-10-CM | POA: Diagnosis not present

## 2017-05-14 ENCOUNTER — Encounter (INDEPENDENT_AMBULATORY_CARE_PROVIDER_SITE_OTHER): Payer: Medicare Other | Admitting: Ophthalmology

## 2017-05-14 ENCOUNTER — Encounter: Payer: Self-pay | Admitting: Internal Medicine

## 2017-05-14 DIAGNOSIS — E113391 Type 2 diabetes mellitus with moderate nonproliferative diabetic retinopathy without macular edema, right eye: Secondary | ICD-10-CM | POA: Diagnosis not present

## 2017-05-14 DIAGNOSIS — H43813 Vitreous degeneration, bilateral: Secondary | ICD-10-CM | POA: Diagnosis not present

## 2017-05-14 DIAGNOSIS — H2513 Age-related nuclear cataract, bilateral: Secondary | ICD-10-CM

## 2017-05-14 DIAGNOSIS — H35033 Hypertensive retinopathy, bilateral: Secondary | ICD-10-CM | POA: Diagnosis not present

## 2017-05-14 DIAGNOSIS — I10 Essential (primary) hypertension: Secondary | ICD-10-CM | POA: Diagnosis not present

## 2017-05-14 DIAGNOSIS — E11311 Type 2 diabetes mellitus with unspecified diabetic retinopathy with macular edema: Secondary | ICD-10-CM

## 2017-05-14 DIAGNOSIS — E113312 Type 2 diabetes mellitus with moderate nonproliferative diabetic retinopathy with macular edema, left eye: Secondary | ICD-10-CM

## 2017-05-14 LAB — HM DIABETES EYE EXAM

## 2017-05-21 ENCOUNTER — Encounter: Payer: Self-pay | Admitting: Internal Medicine

## 2017-05-21 ENCOUNTER — Ambulatory Visit: Payer: Medicare Other | Attending: Internal Medicine | Admitting: Internal Medicine

## 2017-05-21 VITALS — BP 170/83 | HR 66 | Temp 97.9°F | Resp 16 | Wt 260.0 lb

## 2017-05-21 DIAGNOSIS — Z833 Family history of diabetes mellitus: Secondary | ICD-10-CM | POA: Diagnosis not present

## 2017-05-21 DIAGNOSIS — E113293 Type 2 diabetes mellitus with mild nonproliferative diabetic retinopathy without macular edema, bilateral: Secondary | ICD-10-CM | POA: Diagnosis not present

## 2017-05-21 DIAGNOSIS — R600 Localized edema: Secondary | ICD-10-CM | POA: Diagnosis not present

## 2017-05-21 DIAGNOSIS — Z803 Family history of malignant neoplasm of breast: Secondary | ICD-10-CM | POA: Diagnosis not present

## 2017-05-21 DIAGNOSIS — E118 Type 2 diabetes mellitus with unspecified complications: Secondary | ICD-10-CM | POA: Diagnosis not present

## 2017-05-21 DIAGNOSIS — F17210 Nicotine dependence, cigarettes, uncomplicated: Secondary | ICD-10-CM | POA: Diagnosis not present

## 2017-05-21 DIAGNOSIS — I10 Essential (primary) hypertension: Secondary | ICD-10-CM | POA: Insufficient documentation

## 2017-05-21 DIAGNOSIS — Z23 Encounter for immunization: Secondary | ICD-10-CM | POA: Insufficient documentation

## 2017-05-21 DIAGNOSIS — Z823 Family history of stroke: Secondary | ICD-10-CM | POA: Diagnosis not present

## 2017-05-21 DIAGNOSIS — Z8249 Family history of ischemic heart disease and other diseases of the circulatory system: Secondary | ICD-10-CM | POA: Insufficient documentation

## 2017-05-21 DIAGNOSIS — Z7982 Long term (current) use of aspirin: Secondary | ICD-10-CM | POA: Insufficient documentation

## 2017-05-21 DIAGNOSIS — Z9889 Other specified postprocedural states: Secondary | ICD-10-CM | POA: Insufficient documentation

## 2017-05-21 DIAGNOSIS — Z7984 Long term (current) use of oral hypoglycemic drugs: Secondary | ICD-10-CM | POA: Diagnosis not present

## 2017-05-21 DIAGNOSIS — Z79899 Other long term (current) drug therapy: Secondary | ICD-10-CM | POA: Diagnosis not present

## 2017-05-21 LAB — GLUCOSE, POCT (MANUAL RESULT ENTRY): POC Glucose: 109 mg/dl — AB (ref 70–99)

## 2017-05-21 MED ORDER — FUROSEMIDE 20 MG PO TABS: 20.0000 mg | ORAL_TABLET | Freq: Every day | ORAL | 3 refills | Status: DC

## 2017-05-21 NOTE — Progress Notes (Signed)
Patient ID: [name], male    DOB: December 25, 1960  MRN: [id number]  CC: Diabetes   Subjective: [name] is a 56 y.o. male who presents for two-month follow-up for chronic disease management His concerns today include:  56 year old male with history of diabetes type 2, HTN, tobacco dependence, HL, CVA (about 3 yrs ago per pt), spinal stenosis of lumbar spine, chronic lower extremity edema  1. DM  -Saw Dr. Katy Fitch and was referred to another specialist for the retinopathy. Laser surgery LT eye 1 wk ago -checks BS once a mth Eating too much. Gained 7 lbs since last visit Not getting in much exercise. Gets tired easily. Plans to join the [employer] -Has been seeing the podiatrist for pre-ulcerative callus plantar surface of the left big toe  2. HTN: took meds already. Limits salt -does not check BP -no CP/SOB. + swelling in LT foot. -taking Gabpaent BID instead of TID. Never purchase compression socks. States that his podiatrist told him I can he should not use them because of circulation issue  Patient Active Problem List   Diagnosis Date Noted  . Immunization due 05/21/2017  . Left leg swelling 03/23/2016  . Sebaceous cyst 02/28/2016  . Mild nonproliferative diabetic retinopathy ([place]) 01/10/2016  . Hypertensive retinopathy of both eyes 01/10/2016  . Spinal stenosis of lumbar region 07/15/2015  . Abnormality of gait 06/07/2015  . Hydrocephalus 06/07/2015  . Peripheral neuropathy 04/26/2015  . Constipation 04/26/2015  . Diabetes mellitus type 2 with complications ([place]) 24/03/7352  . Decreased hearing of both ears 03/15/2015  . Tinea pedis 03/15/2015  . Callus of foot 05/22/2014  . 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 Coronary vasospasm, cocaine related 12/29/2013  . Cocaine abuse ([place]) 12/28/2013  . Alcoholism /alcohol abuse ([place]) 12/28/2013  . Left-sided weakness 12/26/2013  . Hyperlipidemia associated with type 2 diabetes mellitus ([place]) 04/01/2007  . OBESITY  NOS 03/28/2007  . Essential hypertension 03/28/2007     Current Outpatient Prescriptions on File Prior to Visit  Medication Sig Dispense Refill  . aspirin EC 81 MG tablet Take 1 tablet (81 mg total) by mouth daily. 100 tablet 0  . atorvastatin (LIPITOR) 40 MG tablet Take 1 tablet (40 mg total) by mouth daily. 90 tablet 3  . cyclobenzaprine (FLEXERIL) 10 MG tablet Take 1 tablet (10 mg total) by mouth 3 (three) times daily as needed for muscle spasms. 30 tablet 0  . gabapentin (NEURONTIN) 300 MG capsule TAKE 1 CAPSULE (300 MG TOTAL) BY MOUTH THREE TIMES DAILY. 270 capsule 3  . glipiZIDE (GLIPIZIDE XL) 10 MG 24 hr tablet Take 1 tablet (10 mg total) by mouth daily with breakfast. 90 tablet 3  . glucose monitoring kit (FREESTYLE) monitoring kit 1 each by Does not apply route as needed for other. Dispense any model that is covered- dispense testing supplies for Q AC/ HS accuchecks- 1 month supply with one refil. 1 each 1  . lisinopril (PRINIVIL,ZESTRIL) 40 MG tablet Take 1 tablet (40 mg total) by mouth daily. 90 tablet 3  . meloxicam (MOBIC) 7.5 MG tablet TAKE 1 TABLET (7.5 MG TOTAL) BY MOUTH DAILY AS NEEDED FOR PAIN. 30 tablet 2  . metFORMIN (GLUCOPHAGE) 1000 MG tablet Take 1 tablet (1,000 mg total) by mouth 2 (two) times daily with a meal. 180 tablet 3  . sildenafil (VIAGRA) 100 MG tablet Take 0.5-1 tablets (50-100 mg total) by mouth daily as needed for erectile dysfunction. 5 tablet 11  .  silver sulfADIAZINE (SILVADENE) 1 % cream Apply 1 application topically daily. 50 g 0   No current facility-administered medications on file prior to visit.     Allergies  Allergen Reactions  . Amlodipine Swelling    Lower extremity swelling    Social History   Social History  . Marital status: Divorced    Spouse name: N/A  . Number of children: 0  . Years of education: 10   Occupational History  . Unemployed    Social History Main Topics  . Smoking status: Current Every Day Smoker    Packs/day:  0.25    Types: Cigarettes  . Smokeless tobacco: Never Used  . Alcohol use 1.2 - 1.8 oz/week    2 - 3 Cans of beer per week     Comment: occassional  . Drug use: Yes    Types: "Crack" cocaine, Cocaine     Comment: once a month  . Sexual activity: Not on file   Other Topics Concern  . Not on file   Social History Narrative   Lives at home alone.   Right-handed.   Occasional use.    Family History  Problem Relation Age of Onset  . Hypertension Mother   . Diabetes Mother   . Cancer Mother        breast cancer   . Hypertension Father   . Heart disease Father   . Stroke Father   . Hypertension Sister     Past Surgical History:  Procedure Laterality Date  . CARDIAC CATHETERIZATION  12/27/2013  . CARDIAC CATHETERIZATION  12/27/2013   Impression: 1. 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 Moderate disease in the small caliber intermediate branch, 3. Normal LV systolic function  . left arm skin graft    . LEFT HEART CATHETERIZATION WITH CORONARY ANGIOGRAM Bilateral 12/26/2013   Procedure: LEFT HEART CATHETERIZATION WITH CORONARY ANGIOGRAM;  Surgeon: Burnell Blanks, MD;  Location: [facility] CATH LAB;  Service: Cardiovascular;  Laterality: Bilateral;    ROS: Review of Systems Negative except as stated above PHYSICAL EXAM: BP (!) 170/83   Pulse 66   Temp 97.9 F (36.6 C) (Oral)   Resp 16   Wt 260 lb (117.9 kg)   SpO2 96%   BMI 32.50 kg/m   Repeat BP 150/70 Wt Readings from Last 3 Encounters:  05/21/17 260 lb (117.9 kg)  03/13/17 253 lb 9.6 oz (115 kg)  02/22/17 258 lb 12.8 oz (117.4 kg)    Physical Exam  General appearance - alert, well appearing, and in no distress Mental status - alert, oriented to person, place, and time, normal mood, behavior, speech, dress, motor activity, and thought processes Chest - clear to auscultation, no wheezes, rales or rhonchi, symmetric air entry Heart - normal rate, regular rhythm, normal S1, S2, no murmurs, rubs, clicks or gallops Extremities  - bilateral lower extremity edema left greater than right. DP pulses 3+ BL  Results for orders placed or performed in visit on 05/21/17  POCT glucose (manual entry)  Result Value Ref Range   POC Glucose 109 (A) 70 - 99 mg/dl   Lab Results  Component Value Date   HGBA1C 6.8 03/13/2017     ASSESSMENT AND PLAN: 1. Type 2 diabetes mellitus with complication, without long-term current use of insulin (HCC) -At goal. Continue current medications - POCT glucose (manual entry)  2. Mild nonproliferative diabetic retinopathy of both eyes associated with type 2 diabetes mellitus, macular edema presence unspecified ([place]) Followed by ophthalmology  3.  Essential hypertension 4. Edema of both legs Stop HCTZ. Start furosemide instead. -Try decreasing gabapentin to 1 tablet daily in case this is contributes to his swelling - furosemide (LASIX) 20 MG tablet; Take 1 tablet (20 mg total) by mouth daily.  Dispense: 30 tablet; Refill: 3 - Basic Metabolic Panel; Future  5. Immunization due - Flu Vaccine QUAD 6+ mos PF IM (Fluarix Quad PF)  Patient was given the opportunity to ask questions.  Patient verbalized understanding of the plan and was able to repeat key elements of the plan.   Orders Placed This Encounter  Procedures  . Flu Vaccine QUAD 6+ mos PF IM (Fluarix Quad PF)  . Basic Metabolic Panel  . POCT glucose (manual entry)     Requested Prescriptions   Signed Prescriptions Disp Refills  . furosemide (LASIX) 20 MG tablet 30 tablet 3    Sig: Take 1 tablet (20 mg total) by mouth daily.    Return in about 6 weeks (around 07/02/2017) for edema legs.  Karle Plumber, MD, FACP

## 2017-05-21 NOTE — Patient Instructions (Addendum)
Stop hydrochlorothiazide. Start furosemide 20 mg daily instead. After you've been on furosemide for 1 week please return to the lab for blood work .  Try taking gabapentin once a day until next visit.  Influenza Virus Vaccine injection (Fluarix) What is this medicine? INFLUENZA VIRUS VACCINE (in floo EN zuh VAHY ruhs vak SEEN) helps to reduce the risk of getting influenza also known as the flu. This medicine may be used for other purposes; ask your health care provider or pharmacist if you have questions. COMMON BRAND NAME(S): Fluarix, Fluzone What should I tell my health care provider before I take this medicine? They need to know if you have any of these conditions: -bleeding disorder like hemophilia -fever or infection -Guillain-Barre syndrome or other neurological problems -immune system problems -infection with the human immunodeficiency virus (HIV) or AIDS -low blood platelet counts -multiple sclerosis -an unusual or allergic reaction to influenza virus vaccine, eggs, chicken proteins, latex, gentamicin, other medicines, foods, dyes or preservatives -pregnant or trying to get pregnant -breast-feeding How should I use this medicine? This vaccine is for injection into a muscle. It is given by a health care professional. A copy of Vaccine Information Statements will be given before each vaccination. Read this sheet carefully each time. The sheet may change frequently. Talk to your pediatrician regarding the use of this medicine in children. Special care may be needed. Overdosage: If you think you have taken too much of this medicine contact a poison control center or emergency room at once. NOTE: This medicine is only for you. Do not share this medicine with others. What if I miss a dose? This does not apply. What may interact with this medicine? -chemotherapy or radiation therapy -medicines that lower your immune system like etanercept, anakinra, infliximab, and  adalimumab -medicines that treat or prevent blood clots like warfarin -phenytoin -steroid medicines like prednisone or cortisone -theophylline -vaccines This list may not describe all possible interactions. Give your health care provider a list of all the medicines, herbs, non-prescription drugs, or dietary supplements you use. Also tell them if you smoke, drink alcohol, or use illegal drugs. Some items may interact with your medicine. What should I watch for while using this medicine? Report any side effects that do not go away within 3 days to your doctor or health care professional. Call your health care provider if any unusual symptoms occur within 6 weeks of receiving this vaccine. You may still catch the flu, but the illness is not usually as bad. You cannot get the flu from the vaccine. The vaccine will not protect against colds or other illnesses that may cause fever. The vaccine is needed every year. What side effects may I notice from receiving this medicine? Side effects that you should report to your doctor or health care professional as soon as possible: -allergic reactions like skin rash, itching or hives, swelling of the face, lips, or tongue Side effects that usually do not require medical attention (report to your doctor or health care professional if they continue or are bothersome): -fever -headache -muscle aches and pains -pain, tenderness, redness, or swelling at site where injected -weak or tired This list may not describe all possible side effects. Call your doctor for medical advice about side effects. You may report side effects to FDA at 1-800-FDA-1088. Where should I keep my medicine? This vaccine is only given in a clinic, pharmacy, doctor's office, or other health care setting and will not be stored at home. NOTE: This sheet is  a summary. It may not cover all possible information. If you have questions about this medicine, talk to your doctor, pharmacist, or health  care provider.  2018 Elsevier/Gold Standard (2008-02-05 09:30:40)

## 2017-05-28 ENCOUNTER — Ambulatory Visit: Payer: Medicare Other | Attending: Internal Medicine

## 2017-05-28 DIAGNOSIS — I10 Essential (primary) hypertension: Secondary | ICD-10-CM

## 2017-05-28 NOTE — Progress Notes (Signed)
Patient here for lab visit only 

## 2017-05-29 LAB — BASIC METABOLIC PANEL
BUN / CREAT RATIO: 16 (ref 9–20)
BUN: 19 mg/dL (ref 6–24)
CO2: 24 mmol/L (ref 20–29)
CREATININE: 1.22 mg/dL (ref 0.76–1.27)
Calcium: 9.4 mg/dL (ref 8.7–10.2)
Chloride: 102 mmol/L (ref 96–106)
GFR calc Af Amer: 75 mL/min/{1.73_m2} (ref >59)
GFR calc non Af Amer: 65 mL/min/{1.73_m2} (ref >59)
GLUCOSE: 123 mg/dL — AB (ref 65–99)
Potassium: 5.3 mmol/L — ABNORMAL HIGH (ref 3.5–5.2)
Sodium: 141 mmol/L (ref 134–144)

## 2017-05-30 ENCOUNTER — Telehealth: Payer: Self-pay | Admitting: Internal Medicine

## 2017-05-30 DIAGNOSIS — E875 Hyperkalemia: Secondary | ICD-10-CM

## 2017-05-30 NOTE — Telephone Encounter (Signed)
PC placed to pt today. I inform him that his potassium level was high on recent blood test. I would like for him to return to lab for repeat K+ level to make sure it is a true elevation before I have to adjust Lisinopril. Also advised to avoid K+ rich foods like bananas, oranges and OJ. Pt expressed understanding and will try to come back to lab.

## 2017-06-12 ENCOUNTER — Ambulatory Visit (INDEPENDENT_AMBULATORY_CARE_PROVIDER_SITE_OTHER): Payer: Medicare Other | Admitting: Podiatry

## 2017-06-12 ENCOUNTER — Encounter: Payer: Self-pay | Admitting: Podiatry

## 2017-06-12 DIAGNOSIS — L84 Corns and callosities: Secondary | ICD-10-CM

## 2017-06-12 DIAGNOSIS — E1142 Type 2 diabetes mellitus with diabetic polyneuropathy: Secondary | ICD-10-CM | POA: Diagnosis not present

## 2017-06-12 DIAGNOSIS — M79675 Pain in left toe(s): Secondary | ICD-10-CM

## 2017-06-12 DIAGNOSIS — B351 Tinea unguium: Secondary | ICD-10-CM

## 2017-06-12 DIAGNOSIS — M79674 Pain in right toe(s): Secondary | ICD-10-CM | POA: Diagnosis not present

## 2017-06-12 NOTE — Progress Notes (Signed)
Patient ID: [name], male   DOB: [birth year], 55 y.o.   MRN: [id number]   Subjective: Patient presents today requesting debridement of toenails which a thick, elongated, walking wearing shoes. Also complaining of a uncomfortable callus on the left hallux and request debridement of this lesion  Objective: Objective: Peripheral pitting edema bilaterally DP and PT pulses 1/4 bilaterally Capillary reflex immediate bilaterally Sensation to 10 g monofilament wire intact 0/5 right and 2/5 left Vibratory sensation nonreactive bilaterally No open skin lesions bilaterally The toenails are hypertrophic and discolored and were debrided on the visit of the 28th 2018 Large callus was dried blood sub-left hallux that remains closed after debridement. There is no surrounding erythema, edema, drainage, warmth Slow unsteady gait requiring roller walker  Assessment: Closed superficial ulcer plantar left hallux Pre-ulcerative plantar callus left hallux Symptomatic mycotic toenails 6-10 Type II diabetic with diminished pedal pulses Type II diabetic with peripheral neuropathy  Plan: Debrided pre-ulcerative plantar callus without any bleeding Debride nails 6-10 mechanically and electrically without any bleeding  Reappoint 3 months

## 2017-06-12 NOTE — Patient Instructions (Signed)

## 2017-07-06 ENCOUNTER — Ambulatory Visit: Payer: Medicare Other | Admitting: Internal Medicine

## 2017-07-24 DIAGNOSIS — I5032 Chronic diastolic (congestive) heart failure: Secondary | ICD-10-CM

## 2017-07-24 DIAGNOSIS — Z8709 Personal history of other diseases of the respiratory system: Secondary | ICD-10-CM

## 2017-07-24 HISTORY — DX: Chronic diastolic (congestive) heart failure: I50.32

## 2017-07-24 HISTORY — DX: Personal history of other diseases of the respiratory system: Z87.09

## 2017-08-03 ENCOUNTER — Encounter: Payer: Self-pay | Admitting: Internal Medicine

## 2017-08-03 ENCOUNTER — Ambulatory Visit (HOSPITAL_BASED_OUTPATIENT_CLINIC_OR_DEPARTMENT_OTHER): Payer: Medicare Other | Admitting: Internal Medicine

## 2017-08-03 VITALS — BP 206/93 | HR 76 | Temp 98.3°F | Resp 16 | Wt 273.2 lb

## 2017-08-03 DIAGNOSIS — Z8249 Family history of ischemic heart disease and other diseases of the circulatory system: Secondary | ICD-10-CM | POA: Insufficient documentation

## 2017-08-03 DIAGNOSIS — L729 Follicular cyst of the skin and subcutaneous tissue, unspecified: Secondary | ICD-10-CM

## 2017-08-03 DIAGNOSIS — Z7984 Long term (current) use of oral hypoglycemic drugs: Secondary | ICD-10-CM | POA: Insufficient documentation

## 2017-08-03 DIAGNOSIS — R600 Localized edema: Secondary | ICD-10-CM | POA: Diagnosis not present

## 2017-08-03 DIAGNOSIS — D234 Other benign neoplasm of skin of scalp and neck: Secondary | ICD-10-CM

## 2017-08-03 DIAGNOSIS — Z7982 Long term (current) use of aspirin: Secondary | ICD-10-CM

## 2017-08-03 DIAGNOSIS — J069 Acute upper respiratory infection, unspecified: Secondary | ICD-10-CM | POA: Diagnosis not present

## 2017-08-03 DIAGNOSIS — Z79899 Other long term (current) drug therapy: Secondary | ICD-10-CM | POA: Insufficient documentation

## 2017-08-03 DIAGNOSIS — Z803 Family history of malignant neoplasm of breast: Secondary | ICD-10-CM | POA: Insufficient documentation

## 2017-08-03 DIAGNOSIS — Z823 Family history of stroke: Secondary | ICD-10-CM | POA: Insufficient documentation

## 2017-08-03 DIAGNOSIS — F17210 Nicotine dependence, cigarettes, uncomplicated: Secondary | ICD-10-CM

## 2017-08-03 DIAGNOSIS — Z9889 Other specified postprocedural states: Secondary | ICD-10-CM | POA: Insufficient documentation

## 2017-08-03 DIAGNOSIS — E118 Type 2 diabetes mellitus with unspecified complications: Secondary | ICD-10-CM

## 2017-08-03 DIAGNOSIS — I10 Essential (primary) hypertension: Secondary | ICD-10-CM

## 2017-08-03 DIAGNOSIS — Z833 Family history of diabetes mellitus: Secondary | ICD-10-CM | POA: Insufficient documentation

## 2017-08-03 LAB — GLUCOSE, POCT (MANUAL RESULT ENTRY): POC Glucose: 148 mg/dl — AB (ref 70–99)

## 2017-08-03 LAB — POCT GLYCOSYLATED HEMOGLOBIN (HGB A1C): Hemoglobin A1C: 7.6

## 2017-08-03 MED ORDER — FUROSEMIDE 20 MG PO TABS: ORAL_TABLET | ORAL | 6 refills | Status: DC

## 2017-08-03 MED ORDER — GLUCOSE BLOOD VI STRP: ORAL_STRIP | 12 refills | Status: DC

## 2017-08-03 MED ORDER — ONETOUCH ULTRA MINI W/DEVICE KIT: PACK | 0 refills | Status: DC

## 2017-08-03 MED ORDER — MUPIROCIN 2 % EX OINT: TOPICAL_OINTMENT | CUTANEOUS | 0 refills | Status: DC

## 2017-08-03 MED ORDER — LISINOPRIL 40 MG PO TABS: 40.0000 mg | ORAL_TABLET | Freq: Every day | ORAL | 3 refills | Status: DC

## 2017-08-03 MED ORDER — ONETOUCH ULTRASOFT LANCETS MISC: 12 refills | Status: DC

## 2017-08-03 MED ORDER — TRUEPLUS LANCETS 28G MISC: 6 refills | Status: DC

## 2017-08-03 MED ORDER — TRUE METRIX METER W/DEVICE KIT: PACK | 0 refills | Status: DC

## 2017-08-03 NOTE — Progress Notes (Signed)
Patient ID: [name], [name]    DOB: 1961/04/03  MRN: [id number]  CC: Leg Swelling; Diabetes; and Cough   Subjective: [name] is a 57 y.o. [name] who presents for follow-up of blood pressure and diabetes. His concerns today include:  [name] with history of diabetes type 2, HTN, tobacco dependence, HL, CVA (about 3 yrs ago per pt), spinal stenosis of lumbar spine, chronic lower extremity edema  1.  HTTN: Patient reports being out of lisinopril for several weeks.  He does have refills on prescription that was written in June by Dr. Adrian Blackwater but reports that his pharmacy told him that he did not -On last visit we changed HCTZ to furosemide.  Prescription was sent to our pharmacy.  Patient was not aware of this.  He uses [facility]. -c/o significant BL LE edema.  NO chest pains, shortness of breath or orthopnea. -He limits salt in the foods -K+ elevated on last BP test.  He was suppose to return to lab for repeat level and avoid foods high in K+ Negative except as stated above 2.  Earlier this year he had an infected scalp cyst I indeed by Dr. Adrian Blackwater.  Got better but now for the past few months the same area drains bloody discharge intermittently.  No pain or redness over the area  3.  DM: Requests prescription for new glucometer and supplies  Patient Active Problem List   Diagnosis Date Noted  . Immunization due 05/21/2017  . Edema of both legs 05/21/2017  . Mild nonproliferative diabetic retinopathy ([place]) 01/10/2016  . Hypertensive retinopathy of both eyes 01/10/2016  . Spinal stenosis of lumbar region 07/15/2015  . Abnormality of gait 06/07/2015  . Hydrocephalus 06/07/2015  . Peripheral neuropathy 04/26/2015  . Constipation 04/26/2015  . Diabetes mellitus type 2 with complications ([place]) 76/73/4193  . Decreased hearing of both ears 03/15/2015  . Tinea pedis 03/15/2015  . Callus of foot 05/22/2014  . Contusion of cervical cord ([place]) 05/22/2014  .  Coronary vasospasm, cocaine related 12/29/2013  . Cocaine abuse ([place]) 12/28/2013  . Alcoholism /alcohol abuse ([place]) 12/28/2013  . Left-sided weakness 12/26/2013  . Hyperlipidemia associated with type 2 diabetes mellitus ([place]) 04/01/2007  . OBESITY NOS 03/28/2007  . Essential hypertension 03/28/2007     Current Outpatient Medications on File Prior to Visit  Medication Sig Dispense Refill  . aspirin EC 81 MG tablet Take 1 tablet (81 mg total) by mouth daily. 100 tablet 0  . atorvastatin (LIPITOR) 40 MG tablet Take 1 tablet (40 mg total) by mouth daily. 90 tablet 3  . cyclobenzaprine (FLEXERIL) 10 MG tablet Take 1 tablet (10 mg total) by mouth 3 (three) times daily as needed for muscle spasms. 30 tablet 0  . gabapentin (NEURONTIN) 300 MG capsule TAKE 1 CAPSULE (300 MG TOTAL) BY MOUTH THREE TIMES DAILY. 270 capsule 3  . glipiZIDE (GLIPIZIDE XL) 10 MG 24 hr tablet Take 1 tablet (10 mg total) by mouth daily with breakfast. 90 tablet 3  . meloxicam (MOBIC) 7.5 MG tablet TAKE 1 TABLET (7.5 MG TOTAL) BY MOUTH DAILY AS NEEDED FOR PAIN. 30 tablet 2  . metFORMIN (GLUCOPHAGE) 1000 MG tablet Take 1 tablet (1,000 mg total) by mouth 2 (two) times daily with a meal. 180 tablet 3  . sildenafil (VIAGRA) 100 MG tablet Take 0.5-1 tablets (50-100 mg total) by mouth daily as needed for erectile dysfunction. 5 tablet 11  . silver sulfADIAZINE (SILVADENE) 1 % cream  Apply 1 application topically daily. 50 g 0   No current facility-administered medications on file prior to visit.     Allergies  Allergen Reactions  . Amlodipine Swelling    Lower extremity swelling    Social History   Socioeconomic History  . Marital status: Divorced    Spouse name: Not on file  . Number of children: 0  . Years of education: 10  . Highest education level: Not on file  Social Needs  . Financial resource strain: Not on file  . Food insecurity - worry: Not on file  . Food insecurity - inability: Not on file  .  Transportation needs - medical: Not on file  . Transportation needs - non-medical: Not on file  Occupational History  . Occupation: Unemployed  Tobacco Use  . Smoking status: Current Every Day Smoker    Packs/day: 0.25    Types: Cigarettes  . Smokeless tobacco: Never Used  Substance and Sexual Activity  . Alcohol use: Yes    Alcohol/week: 1.2 - 1.8 oz    Types: 2 - 3 Cans of beer per week    Comment: occassional  . Drug use: Yes    Types: "Crack" cocaine, Cocaine    Comment: once a month  . Sexual activity: Not on file  Other Topics Concern  . Not on file  Social History Narrative   Lives at home alone.   Right-handed.   Occasional use.    Family History  Problem Relation Age of Onset  . Hypertension Mother   . Diabetes Mother   . Cancer Mother        breast cancer   . Hypertension Father   . Heart disease Father   . Stroke Father   . Hypertension Sister     Past Surgical History:  Procedure Laterality Date  . CARDIAC CATHETERIZATION  12/27/2013  . CARDIAC CATHETERIZATION  12/27/2013   Impression: 1. 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 Moderate disease in the small caliber intermediate branch, 3. Normal LV systolic function  . left arm skin graft    . LEFT HEART CATHETERIZATION WITH CORONARY ANGIOGRAM Bilateral 12/26/2013   Procedure: LEFT HEART CATHETERIZATION WITH CORONARY ANGIOGRAM;  Surgeon: Burnell Blanks, MD;  [facility] CATH LAB;  Service: Cardiovascular;  Laterality: Bilateral;    ROS: Review of Systems complaining of having a cold for 1 week.  Symptoms include runny nose, nasal congestion, cough.  No sore throat or fevers.  No shortness of breath PHYSICAL EXAM: BP (!) 206/93   Pulse 76   Temp 98.3 F (36.8 C) (Oral)   Resp 16   Wt 273 lb 3.2 oz (123.9 kg)   SpO2 98%   BMI 34.15 kg/m    Wt Readings from Last 3 Encounters:  08/03/17 273 lb 3.2 oz (123.9 kg)  05/21/17 260 lb (117.9 kg)  03/13/17 253 lb 9.6 oz (115 kg)   Repeat 190/90 Physical  Exam  General appearance - alert, well appearing, and in no distress Mental status - alert, oriented to person, place, and time, normal mood, behavior, speech, dress, motor activity, and thought processes Chest - clear to auscultation, no wheezes, rales or rhonchi, symmetric air entry Heart - normal rate, regular rhythm, normal S1, S2, no murmurs, rubs, clicks or gallops Extremities -1-2+ BL LE edema LT>RT Skin -small hyperpigmented area LT temple that has small hole.  Upon compression, bloody drainage expressed.  Gauze was subsequently applied  Depression screen [facility] 2/9 08/03/2017 03/13/2017 02/22/2017  Decreased  Interest '1 1 1  ' Down, Depressed, Hopeless 0 1 1  PHQ - 2 Score '1 2 2  ' Altered sleeping - 0 0  Tired, decreased energy - 1 1  Change in appetite - 1 0  Feeling bad or failure about yourself  - 0 0  Trouble concentrating - 0 1  Moving slowly or fidgety/restless - 0 0  Suicidal thoughts - - 0  PHQ-9 Score - 4 4   Results for orders placed or performed in visit on 08/03/17  POCT glycosylated hemoglobin (Hb A1C)  Result Value Ref Range   Hemoglobin A1C 7.6   POCT glucose (manual entry)  Result Value Ref Range   POC Glucose 148 (A) 70 - 99 mg/dl    ASSESSMENT AND PLAN: 1. Essential hypertension Uncontrolled.  Refill sent on lisinopril and furosemide to his pharmacy.  I had my nurse call the pharmacy so that they can deliver to him today - furosemide (LASIX) 20 MG tablet; 1 tab PO daily x 3 days then 1/2 tab daily  Dispense: 30 tablet; Refill: 6 - lisinopril (PRINIVIL,ZESTRIL) 40 MG tablet; Take 1 tablet (40 mg total) by mouth daily.  Dispense: 90 tablet; Refill: 3  2. Edema of both legs -Start furosemide as discussed above Limit salt in the foods  3. Upper respiratory tract infection, unspecified type -Treat conservatively.  He can purchase diabetic [place] over-the-counter  4. Scalp cyst -This was once an infected cyst that was drained by previous PCP.  However he is left  with a draining track.  May need referral to dermatology to have this cleaned out so that it can heal properly.  I have prescribed some antibiotic ointment to apply to the area and will reassess when I see him again in 1 week  5. Type 2 diabetes mellitus with complication, without long-term current use of insulin (HCC) Not addressed today other than to send prescriptions for glucometer with address on follow-up visit. - POCT glycosylated hemoglobin (Hb A1C) - POCT glucose (manual entry) - Blood Glucose Monitoring Suppl (TRUE METRIX METER) w/Device KIT; Use as directed  Dispense: 1 kit; Refill: 0 - glucose blood (TRUE METRIX BLOOD GLUCOSE TEST) test strip; Use as instructed  Dispense: 100 each; Refill: 12 - TRUEPLUS LANCETS 28G MISC; Use as directed  Dispense: 100 each; Refill: 6  Patient was given the opportunity to ask questions.  Patient verbalized understanding of the plan and was able to repeat key elements of the plan.   Orders Placed This Encounter  Procedures  . POCT glycosylated hemoglobin (Hb A1C)  . POCT glucose (manual entry)     Requested Prescriptions   Signed Prescriptions Disp Refills  . furosemide (LASIX) 20 MG tablet 30 tablet 6    Sig: 1 tab PO daily x 3 days then 1/2 tab daily  . lisinopril (PRINIVIL,ZESTRIL) 40 MG tablet 90 tablet 3    Sig: Take 1 tablet (40 mg total) by mouth daily.  . mupirocin ointment (BACTROBAN) 2 % 22 g 0    Sig: Apply to affected area BID  . Lancets (ONETOUCH ULTRASOFT) lancets 100 each 12    Sig: Use as instructed  . Blood Glucose Monitoring Suppl (TRUE METRIX METER) w/Device KIT 1 kit 0    Sig: Use as directed  . glucose blood (TRUE METRIX BLOOD GLUCOSE TEST) test strip 100 each 12    Sig: Use as instructed  . TRUEPLUS LANCETS 28G MISC 100 each 6    Sig: Use as directed  Return in about 1 week (around 08/10/2017) for BP/DM.  Karle Plumber, MD, FACP

## 2017-08-03 NOTE — Progress Notes (Signed)
Pt states his leg swelling has been going on for about a month  Pt states he has bene out of bp medicine for a couple of weeks

## 2017-08-03 NOTE — Patient Instructions (Signed)
You can purchase and use Diabetic [place] over the counter for cough/congestion.  Take Furosemide daily as prescribe.  This will help decrease swelling in the legs.

## 2017-08-04 ENCOUNTER — Inpatient Hospital Stay (HOSPITAL_COMMUNITY)
Admission: EM | Admit: 2017-08-04 | Discharge: 2017-08-07 | DRG: 291 | Disposition: A | Discharge status: Home / Self Care | Payer: Medicare Other | Attending: Oncology | Admitting: Oncology

## 2017-08-04 ENCOUNTER — Other Ambulatory Visit: Payer: Self-pay

## 2017-08-04 ENCOUNTER — Encounter (HOSPITAL_COMMUNITY): Payer: Self-pay | Admitting: Emergency Medicine

## 2017-08-04 ENCOUNTER — Emergency Department (HOSPITAL_COMMUNITY): Payer: Medicare Other

## 2017-08-04 ENCOUNTER — Emergency Department (HOSPITAL_BASED_OUTPATIENT_CLINIC_OR_DEPARTMENT_OTHER): Admit: 2017-08-04 | Discharge: 2017-08-04 | Disposition: A | Discharge status: Home / Self Care | Payer: Medicare Other

## 2017-08-04 DIAGNOSIS — R609 Edema, unspecified: Secondary | ICD-10-CM

## 2017-08-04 DIAGNOSIS — J208 Acute bronchitis due to other specified organisms: Secondary | ICD-10-CM | POA: Diagnosis present

## 2017-08-04 DIAGNOSIS — R05 Cough: Secondary | ICD-10-CM

## 2017-08-04 DIAGNOSIS — E8770 Fluid overload, unspecified: Secondary | ICD-10-CM | POA: Diagnosis not present

## 2017-08-04 DIAGNOSIS — I110 Hypertensive heart disease with heart failure: Secondary | ICD-10-CM | POA: Diagnosis not present

## 2017-08-04 DIAGNOSIS — R059 Cough, unspecified: Secondary | ICD-10-CM

## 2017-08-04 DIAGNOSIS — E1169 Type 2 diabetes mellitus with other specified complication: Secondary | ICD-10-CM | POA: Diagnosis present

## 2017-08-04 DIAGNOSIS — I10 Essential (primary) hypertension: Secondary | ICD-10-CM | POA: Diagnosis present

## 2017-08-04 DIAGNOSIS — J9811 Atelectasis: Secondary | ICD-10-CM | POA: Diagnosis present

## 2017-08-04 DIAGNOSIS — Z79899 Other long term (current) drug therapy: Secondary | ICD-10-CM

## 2017-08-04 DIAGNOSIS — I5031 Acute diastolic (congestive) heart failure: Secondary | ICD-10-CM | POA: Diagnosis not present

## 2017-08-04 DIAGNOSIS — Z7984 Long term (current) use of oral hypoglycemic drugs: Secondary | ICD-10-CM

## 2017-08-04 DIAGNOSIS — F1099 Alcohol use, unspecified with unspecified alcohol-induced disorder: Secondary | ICD-10-CM | POA: Diagnosis not present

## 2017-08-04 DIAGNOSIS — R0602 Shortness of breath: Secondary | ICD-10-CM | POA: Diagnosis not present

## 2017-08-04 DIAGNOSIS — Z9114 Patient's other noncompliance with medication regimen: Secondary | ICD-10-CM | POA: Diagnosis not present

## 2017-08-04 DIAGNOSIS — E119 Type 2 diabetes mellitus without complications: Secondary | ICD-10-CM | POA: Diagnosis present

## 2017-08-04 DIAGNOSIS — F1490 Cocaine use, unspecified, uncomplicated: Secondary | ICD-10-CM | POA: Diagnosis not present

## 2017-08-04 DIAGNOSIS — I2510 Atherosclerotic heart disease of native coronary artery without angina pectoris: Secondary | ICD-10-CM | POA: Diagnosis present

## 2017-08-04 DIAGNOSIS — F17210 Nicotine dependence, cigarettes, uncomplicated: Secondary | ICD-10-CM | POA: Diagnosis present

## 2017-08-04 DIAGNOSIS — R0600 Dyspnea, unspecified: Secondary | ICD-10-CM | POA: Diagnosis not present

## 2017-08-04 DIAGNOSIS — F1410 Cocaine abuse, uncomplicated: Secondary | ICD-10-CM | POA: Diagnosis present

## 2017-08-04 DIAGNOSIS — Z8673 Personal history of transient ischemic attack (TIA), and cerebral infarction without residual deficits: Secondary | ICD-10-CM

## 2017-08-04 DIAGNOSIS — F1010 Alcohol abuse, uncomplicated: Secondary | ICD-10-CM | POA: Diagnosis not present

## 2017-08-04 DIAGNOSIS — E785 Hyperlipidemia, unspecified: Secondary | ICD-10-CM | POA: Diagnosis present

## 2017-08-04 DIAGNOSIS — R600 Localized edema: Secondary | ICD-10-CM | POA: Diagnosis present

## 2017-08-04 DIAGNOSIS — L03116 Cellulitis of left lower limb: Secondary | ICD-10-CM | POA: Diagnosis present

## 2017-08-04 DIAGNOSIS — Z720 Tobacco use: Secondary | ICD-10-CM | POA: Diagnosis not present

## 2017-08-04 DIAGNOSIS — R918 Other nonspecific abnormal finding of lung field: Secondary | ICD-10-CM | POA: Diagnosis present

## 2017-08-04 DIAGNOSIS — J810 Acute pulmonary edema: Secondary | ICD-10-CM | POA: Diagnosis present

## 2017-08-04 DIAGNOSIS — E118 Type 2 diabetes mellitus with unspecified complications: Secondary | ICD-10-CM | POA: Diagnosis present

## 2017-08-04 DIAGNOSIS — R0989 Other specified symptoms and signs involving the circulatory and respiratory systems: Secondary | ICD-10-CM | POA: Diagnosis not present

## 2017-08-04 DIAGNOSIS — Z888 Allergy status to other drugs, medicaments and biological substances status: Secondary | ICD-10-CM | POA: Diagnosis not present

## 2017-08-04 DIAGNOSIS — L03115 Cellulitis of right lower limb: Secondary | ICD-10-CM | POA: Diagnosis not present

## 2017-08-04 DIAGNOSIS — I878 Other specified disorders of veins: Secondary | ICD-10-CM | POA: Diagnosis present

## 2017-08-04 DIAGNOSIS — I43 Cardiomyopathy in diseases classified elsewhere: Secondary | ICD-10-CM | POA: Diagnosis present

## 2017-08-04 DIAGNOSIS — Z7982 Long term (current) use of aspirin: Secondary | ICD-10-CM

## 2017-08-04 LAB — CBC WITH DIFFERENTIAL/PLATELET
BASOS ABS: 0 10*3/uL (ref 0.0–0.1)
Basophils Relative: 0 %
Eosinophils Absolute: 0.1 10*3/uL (ref 0.0–0.7)
Eosinophils Relative: 1 %
HCT: 44.5 % (ref 39.0–52.0)
Hemoglobin: 15.2 g/dL (ref 13.0–17.0)
LYMPHS PCT: 23 %
Lymphs Abs: 1.5 10*3/uL (ref 0.7–4.0)
MCH: 28.6 pg (ref 26.0–34.0)
MCHC: 34.2 g/dL (ref 30.0–36.0)
MCV: 83.8 fL (ref 78.0–100.0)
MONO ABS: 1.2 10*3/uL — AB (ref 0.1–1.0)
Monocytes Relative: 18 %
Neutro Abs: 4 10*3/uL (ref 1.7–7.7)
Neutrophils Relative %: 58 %
PLATELETS: 216 10*3/uL (ref 150–400)
RBC: 5.31 MIL/uL (ref 4.22–5.81)
RDW: 13.9 % (ref 11.5–15.5)
WBC: 6.8 10*3/uL (ref 4.0–10.5)

## 2017-08-04 LAB — COMPREHENSIVE METABOLIC PANEL
ALT: 18 U/L (ref 17–63)
AST: 21 U/L (ref 15–41)
Albumin: 3.6 g/dL (ref 3.5–5.0)
Alkaline Phosphatase: 52 U/L (ref 38–126)
Anion gap: 10 (ref 5–15)
BILIRUBIN TOTAL: 0.8 mg/dL (ref 0.3–1.2)
BUN: 12 mg/dL (ref 6–20)
CHLORIDE: 104 mmol/L (ref 101–111)
CO2: 22 mmol/L (ref 22–32)
CREATININE: 1.11 mg/dL (ref 0.61–1.24)
Calcium: 8.9 mg/dL (ref 8.9–10.3)
GFR calc Af Amer: >60 mL/min (ref >60)
Glucose, Bld: 112 mg/dL — ABNORMAL HIGH (ref 65–99)
Potassium: 4.1 mmol/L (ref 3.5–5.1)
Sodium: 136 mmol/L (ref 135–145)
TOTAL PROTEIN: 7.1 g/dL (ref 6.5–8.1)

## 2017-08-04 LAB — PROTIME-INR
INR: 1.01
Prothrombin Time: 13.2 seconds (ref 11.4–15.2)

## 2017-08-04 LAB — URINALYSIS, ROUTINE W REFLEX MICROSCOPIC
BACTERIA UA: NONE SEEN
Bilirubin Urine: NEGATIVE
GLUCOSE, UA: NEGATIVE mg/dL
Ketones, ur: NEGATIVE mg/dL
Leukocytes, UA: NEGATIVE
Nitrite: NEGATIVE
PROTEIN: 100 mg/dL — AB
SQUAMOUS EPITHELIAL / LPF: NONE SEEN
Specific Gravity, Urine: 1.015 (ref 1.005–1.030)
pH: 6 (ref 5.0–8.0)

## 2017-08-04 LAB — I-STAT TROPONIN, ED: TROPONIN I, POC: 0.03 ng/mL (ref 0.00–0.08)

## 2017-08-04 LAB — I-STAT CG4 LACTIC ACID, ED
LACTIC ACID, VENOUS: 0.91 mmol/L (ref 0.5–1.9)
LACTIC ACID, VENOUS: 0.89 mmol/L (ref 0.5–1.9)

## 2017-08-04 LAB — BRAIN NATRIURETIC PEPTIDE: B NATRIURETIC PEPTIDE 5: 310.6 pg/mL — AB (ref 0.0–100.0)

## 2017-08-04 LAB — LIPASE, BLOOD: Lipase: 36 U/L (ref 11–51)

## 2017-08-04 MED ORDER — MUPIROCIN 2 % EX OINT
TOPICAL_OINTMENT | Freq: Two times a day (BID) | CUTANEOUS | Status: DC
  Administered 2017-08-04 – 2017-08-06 (×5): via TOPICAL
  Administered 2017-08-07: 1 via TOPICAL
  Filled 2017-08-04 (×2): qty 22

## 2017-08-04 MED ORDER — FUROSEMIDE 10 MG/ML IJ SOLN
20.0000 mg | Freq: Once | INTRAMUSCULAR | Status: AC
  Administered 2017-08-04: 20 mg via INTRAVENOUS
  Filled 2017-08-04: qty 2

## 2017-08-04 MED ORDER — ACETAMINOPHEN 325 MG PO TABS
650.0000 mg | ORAL_TABLET | Freq: Four times a day (QID) | ORAL | Status: DC | PRN
  Administered 2017-08-04 – 2017-08-07 (×6): 650 mg via ORAL
  Filled 2017-08-04 (×5): qty 2

## 2017-08-04 MED ORDER — IPRATROPIUM-ALBUTEROL 0.5-2.5 (3) MG/3ML IN SOLN
3.0000 mL | Freq: Once | RESPIRATORY_TRACT | Status: AC
  Administered 2017-08-04: 3 mL via RESPIRATORY_TRACT
  Filled 2017-08-04: qty 3

## 2017-08-04 MED ORDER — ASPIRIN EC 81 MG PO TBEC
81.0000 mg | DELAYED_RELEASE_TABLET | Freq: Every day | ORAL | Status: DC
  Administered 2017-08-04 – 2017-08-07 (×4): 81 mg via ORAL
  Filled 2017-08-04 (×4): qty 1

## 2017-08-04 MED ORDER — GLIPIZIDE ER 10 MG PO TB24
10.0000 mg | ORAL_TABLET | Freq: Every day | ORAL | Status: DC
  Administered 2017-08-05 – 2017-08-07 (×3): 10 mg via ORAL
  Filled 2017-08-04 (×3): qty 1

## 2017-08-04 MED ORDER — HYDRALAZINE HCL 20 MG/ML IJ SOLN: 5.0000 mg | Freq: Four times a day (QID) | INTRAMUSCULAR | Status: DC | PRN

## 2017-08-04 MED ORDER — ENOXAPARIN SODIUM 40 MG/0.4ML ~~LOC~~ SOLN
40.0000 mg | SUBCUTANEOUS | Status: DC
  Administered 2017-08-04 – 2017-08-06 (×3): 40 mg via SUBCUTANEOUS
  Filled 2017-08-04 (×3): qty 0.4

## 2017-08-04 MED ORDER — GUAIFENESIN ER 600 MG PO TB12
600.0000 mg | ORAL_TABLET | Freq: Two times a day (BID) | ORAL | Status: DC
  Administered 2017-08-04 – 2017-08-07 (×6): 600 mg via ORAL
  Filled 2017-08-04 (×6): qty 1

## 2017-08-04 MED ORDER — FUROSEMIDE 10 MG/ML IJ SOLN
20.0000 mg | Freq: Two times a day (BID) | INTRAMUSCULAR | Status: DC
  Administered 2017-08-05 – 2017-08-06 (×3): 20 mg via INTRAVENOUS
  Filled 2017-08-04 (×3): qty 2

## 2017-08-04 MED ORDER — ATORVASTATIN CALCIUM 40 MG PO TABS
40.0000 mg | ORAL_TABLET | Freq: Every day | ORAL | Status: DC
  Administered 2017-08-04 – 2017-08-07 (×4): 40 mg via ORAL
  Filled 2017-08-04 (×4): qty 1

## 2017-08-04 NOTE — ED Notes (Signed)
Assisted patient with ambulation into hallway. Pt appears SOB with exertion, even sitting on side of bed. Pt perceives himself to be SOB. O2 Sats 97% on RA with ambulation.

## 2017-08-04 NOTE — ED Notes (Signed)
Vascular tech is aware of patient 

## 2017-08-04 NOTE — ED Triage Notes (Signed)
Pt. Stated, I started feeling weak yesterday with a cough. This happened a year ago and had pneumonia and the flu

## 2017-08-04 NOTE — ED Notes (Signed)
Admitting team at bedside.

## 2017-08-04 NOTE — ED Provider Notes (Signed)
[facility] Provider Note   CSN: [id number] Arrival date & time: 08/04/17  1046     History   Chief Complaint Chief Complaint  Patient presents with  . Weakness  . Cough    HPI [name] is a 57 y.o. male.  HPI   Pt is a 57 y/o M with a h/o HTN, T2DM, tobacco dependance, HLD, and CVA who presents to the ED complaining of a 3-4 day history of productive cough with clear sputum.  Patient also reports fevers, chills, generalized weakness x1 day, urinary frequency and HAs. Denies unilateral weakness. Also reports a 1 month h/o increasing leg swelling. Reports left leg swelling >right but states that is chronic for him. Denies dysuria, hematuria, rhinorrhea, congestion, sore throat, shortness of breath, chest pain, abdominal pain, nausea, vomiting, diarrhea, constipation, or any other symptoms.   Was seen at his PCP office yesterday and dx with URI. Was also started on 20mg  Lasix for LE swelling. States that he took his BP meds and Lasix this morning.  Past Medical History:  Diagnosis Date  . Alcohol abuse   . Chronic arm pain   . Chronic leg pain   . Chronic pain   . Cocaine abuse ([place])   . Diabetes mellitus   . Gait instability   . Hypertension   . 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 Peripheral edema    chronic LUE  . Tobacco abuse     Patient Active Problem List   Diagnosis Date Noted  . Volume overload 08/04/2017  . Immunization due 05/21/2017  . Edema of both legs 05/21/2017  . 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 Hypertensive retinopathy of both eyes 01/10/2016  . Spinal stenosis of lumbar region 07/15/2015  . Abnormality of gait 06/07/2015  . Hydrocephalus 06/07/2015  . Peripheral neuropathy 04/26/2015  . Constipation 04/26/2015  . Diabetes mellitus type 2 with complications ([place]) 25/42/7062  . Decreased hearing of both ears 03/15/2015  . Tinea pedis 03/15/2015  . Callus of  foot 05/22/2014  . 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 Coronary vasospasm, cocaine related 12/29/2013  . Cocaine abuse ([place]) 12/28/2013  . Alcoholism /alcohol abuse ([place]) 12/28/2013  . Left-sided weakness 12/26/2013  . Hyperlipidemia associated with type 2 diabetes mellitus ([place]) 04/01/2007  . OBESITY NOS 03/28/2007  . Essential hypertension 03/28/2007    Past Surgical History:  Procedure Laterality Date  . CARDIAC CATHETERIZATION  12/27/2013  . CARDIAC CATHETERIZATION  12/27/2013   Impression: 1. 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 Moderate disease in the small caliber intermediate branch, 3. Normal LV systolic function  . left arm skin graft    . LEFT HEART CATHETERIZATION WITH CORONARY ANGIOGRAM Bilateral 12/26/2013   Procedure: LEFT HEART CATHETERIZATION WITH CORONARY ANGIOGRAM;  Surgeon: Burnell Blanks, MD;  Location[facility] C[facility] LAB;  Service: Cardiovascular;  Laterality: Bilateral;       Home Medications    Prior to Admission medications   Medication Sig Start Date End Date Taking? Authorizing Provider  furosemide (LASIX) 20 MG tablet 1 tab PO daily x 3 days then 1/2 tab daily Patient taking differently: Take 20 mg by mouth See admin instructions. 1 tab PO daily x 3 days then 1/2 tab daily 08/03/17  Yes Ladell Pier, MD  gabapentin (NEURONTIN) 300 MG capsule TAKE 1 CAPSULE (300 MG TOTAL) BY MOUTH THREE TIMES DAILY. 02/22/17  Yes Boykin Nearing, MD  glipiZIDE (GLIPIZIDE  XL) 10 MG 24 hr tablet Take 1 tablet (10 mg total) by mouth daily with breakfast. 12/28/16  Yes Funches, Josalyn, MD  IBU 800 MG tablet Take 800 mg by mouth as needed for pain. 06/27/17  Yes [provider]  lisinopril (PRINIVIL,ZESTRIL) 40 MG tablet Take 1 tablet (40 mg total) by mouth daily. 08/03/17  Yes Ladell Pier, MD  mupirocin ointment Drue Stager) 2 % Apply to affected area BID 08/03/17  Yes Ladell Pier, MD  aspirin EC 81 MG tablet Take 1 tablet (81 mg total) by mouth  daily. Patient not taking: Reported on 08/04/2017 03/13/17   Ladell Pier, MD  atorvastatin (LIPITOR) 40 MG tablet Take 1 tablet (40 mg total) by mouth daily. Patient not taking: Reported on 08/04/2017 12/28/16   Boykin Nearing, MD  cyclobenzaprine (FLEXERIL) 10 MG tablet Take 1 tablet (10 mg total) by mouth 3 (three) times daily as needed for muscle spasms. Patient not taking: Reported on 08/04/2017 02/22/17   Boykin Nearing, MD  glucose blood (TRUE METRIX BLOOD GLUCOSE TEST) test strip Use as instructed 08/03/17   Ladell Pier, MD  Lancets [facility] ULTRASOFT) lancets Use as instructed 08/03/17   Ladell Pier, MD  meloxicam (MOBIC) 7.5 MG tablet TAKE 1 TABLET (7.5 MG TOTAL) BY MOUTH DAILY AS NEEDED FOR PAIN. Patient not taking: Reported on 08/04/2017 12/25/16   Boykin Nearing, MD  metFORMIN (GLUCOPHAGE) 1000 MG tablet Take 1 tablet (1,000 mg total) by mouth 2 (two) times daily with a meal. Patient not taking: Reported on 08/04/2017 12/28/16   Boykin Nearing, MD  sildenafil (VIAGRA) 100 MG tablet Take 0.5-1 tablets (50-100 mg total) by mouth daily as needed for erectile dysfunction. Patient not taking: Reported on 08/04/2017 11/28/16   Boykin Nearing, MD  silver sulfADIAZINE (SILVADENE) 1 % cream Apply 1 application topically daily. Patient not taking: Reported on 08/04/2017 03/20/17   Gean Birchwood, DPM  TRUEPLUS LANCETS 28G MISC Use as directed 08/03/17   Ladell Pier, MD    Family History Family History  Problem Relation Age of Onset  . Hypertension Mother   . Diabetes Mother   . Cancer Mother        breast cancer   . Hypertension Father   . Heart disease Father   . Stroke Father   . Hypertension Sister     Social History Social History   Tobacco Use  . Smoking status: Current Every Day Smoker    Packs/day: 0.25    Types: Cigarettes  . Smokeless tobacco: Never Used  Substance Use Topics  . Alcohol use: Yes    Alcohol/week: 1.2 - 1.8 oz    Types: 2 - 3  Cans of beer per week    Comment: occassional  . Drug use: Yes    Types: "Crack" cocaine, Cocaine    Comment: once a month     Allergies   Amlodipine   Review of Systems Review of Systems  Constitutional: Positive for chills and fever.  HENT: Negative for congestion, ear pain, rhinorrhea, sinus pain and sore throat.   Eyes: Negative for pain and visual disturbance.  Respiratory: Positive for cough. Negative for shortness of breath.   Cardiovascular: Positive for leg swelling. Negative for chest pain and palpitations.  Gastrointestinal: Negative for abdominal pain, constipation, diarrhea, nausea and vomiting.  Genitourinary: Positive for frequency. Negative for dysuria, hematuria and urgency.  Musculoskeletal: Negative for arthralgias, back pain and neck pain.  Skin: Negative for color change and rash.  Neurological: Positive for weakness and headaches. Negative for seizures and syncope.       No unilateral weakness  All other systems reviewed and are negative.    Physical Exam Updated Vital Signs BP (!) 175/60 (BP Location: Right Arm)   Pulse 83   Temp (!) 101 F (38.3 C) (Oral)   Resp 18   Ht 6\' 3"  (1.905 m)   Wt 119 kg (262 lb 5.6 oz)   SpO2 100%   BMI 32.79 kg/m    Physical Exam  Constitutional: He is oriented to person, place, and time. He appears well-developed and well-nourished. No distress.  HENT:  Head: Normocephalic and atraumatic.  Mouth/Throat: Oropharynx is clear and moist. No oropharyngeal exudate.  Eyes: Conjunctivae and EOM are normal. Pupils are equal, round, and reactive to light. No scleral icterus.  Neck: Normal range of motion. Neck supple.  Cardiovascular: Normal rate, regular rhythm, normal heart sounds and intact distal pulses.  No murmur heard. Pulmonary/Chest: Effort normal. No respiratory distress. He has wheezes (diffuse throughout). He has rales (left lower and left mid lung fields).  Abdominal: Soft. Bowel sounds are normal. He  exhibits no distension and no mass. There is no tenderness. There is no guarding.  Musculoskeletal:  Bilat LE swelling, L>R (L>R is chronic). Erythema to medial aspect of LE near the ankle.   Lymphadenopathy:    He has no cervical adenopathy.  Neurological: He is alert and oriented to person, place, and time.  Mental Status:  Alert, thought content appropriate, able to give a coherent history. Speech fluent without evidence of aphasia. Able to follow 2 step commands without difficulty.  Cranial Nerves:  II:   pupils equal, round, reactive to light III,IV, VI: ptosis not present, extra-ocular motions intact bilaterally  V,VII: smile symmetric VIII: hearing grossly normal to voice  X: uvula elevates symmetrically  XI: bilateral shoulder shrug symmetric and strong XII: midline tongue extension without fassiculations Motor:  Normal tone. 5/5 strength of BUE and BLE major muscle groups including strong and equal grip strength and dorsiflexion/plantar flexion Sensory: light touch normal in all extremities. DTRs: biceps and achilles 2+ symmetric b/l CV: 2+ radial and DP/PT pulses  Skin: Skin is warm and dry.  Psychiatric: He has a normal mood and affect.  Nursing note and vitals reviewed.    ED Treatments / Results  Labs (all labs ordered are listed, but only abnormal results are displayed) Labs Reviewed  COMPREHENSIVE METABOLIC PANEL - Abnormal; Notable for the following components:      Result Value   Glucose, Bld 112 (*)    All other components within normal limits  CBC WITH DIFFERENTIAL/PLATELET - Abnormal; Notable for the following components:   Monocytes Absolute 1.2 (*)    All other components within normal limits  URINALYSIS, ROUTINE W REFLEX MICROSCOPIC - Abnormal; Notable for the following components:   Hgb urine dipstick SMALL (*)    Protein, ur 100 (*)    All other components within normal limits  BRAIN NATRIURETIC PEPTIDE - Abnormal; Notable for the following  components:   B Natriuretic Peptide 310.6 (*)    All other components within normal limits  PROTIME-INR  LIPASE, BLOOD  BASIC METABOLIC PANEL  I-STAT CG4 LACTIC ACID, ED  I-STAT TROPONIN, ED  I-STAT CG4 LACTIC ACID, ED    EKG  EKG Interpretation  Date/Time:  Saturday August 04 2017 11:21:42 EST Ventricular Rate:  83 PR Interval:    QRS Duration: 112 QT Interval:  351 QTC Calculation:  413 R Axis:   -62 Text Interpretation:  Sinus rhythm Ventricular premature complex Prolonged PR interval Probable left atrial enlargement Left anterior fascicular block Abnormal R-wave progression, late transition Left ventricular hypertrophy Nonspecific T abnormalities, inferior leads ST elevation, consider anterior injury When compared to prior, new PVC.  NO STEMI Confirmed by Antony Blackbird [phone]) on 08/04/2017 11:42:58 AM       Radiology Dg Chest 2 View  Result Date: 08/04/2017 CLINICAL DATA:  Cough, weakness, fever EXAM: CHEST  2 VIEW COMPARISON:  01/19/2017 FINDINGS: Cardiomegaly with vascular congestion. No effusions. No acute bony abnormality. Linear atelectasis in the left mid lung. No overt edema. IMPRESSION: Cardiomegaly, vascular congestion. Subsegmental atelectasis in the left mid lung. Electronically Signed   By: Rolm Baptise M.D.   On: 08/04/2017 11:17    Procedures Procedures (including critical care time)  Medications Ordered in ED Medications  glipiZIDE (GLUCOTROL XL) 24 hr tablet 10 mg (not administered)  enoxaparin (LOVENOX) injection 40 mg (40 mg Subcutaneous Given 08/04/17 1856)  atorvastatin (LIPITOR) tablet 40 mg (40 mg Oral Given 08/04/17 1856)  aspirin EC tablet 81 mg (81 mg Oral Given 08/04/17 1856)  guaiFENesin (MUCINEX) 12 hr tablet 600 mg (600 mg Oral Given 08/04/17 2131)  mupirocin ointment (BACTROBAN) 2 % ( Topical Given 08/04/17 2131)  furosemide (LASIX) injection 20 mg (not administered)  acetaminophen (TYLENOL) tablet 650 mg (650 mg Oral Given 08/04/17 2325)   hydrALAZINE (APRESOLINE) injection 5 mg (not administered)  ipratropium-albuterol (DUONEB) 0.5-2.5 (3) MG/3ML nebulizer solution 3 mL (3 mLs Nebulization Given 08/04/17 1208)  furosemide (LASIX) injection 20 mg (20 mg Intravenous Given 08/04/17 1552)     Initial Impression / Assessment and Plan / ED Course  I have reviewed the triage vital signs and the nursing notes.  Pertinent labs & imaging results that were available during my care of the patient were reviewed by me and considered in my medical decision making (see chart for details).    Evaluated patient in the ED.  He is satting at 100% on room air.  Orders placed.  Staffed pt with Dr. Sherry Ruffing who evaluated the pt and agrees with the plan for labs, EKG, CXR, and LE [name]. He was informed by nursing the pt became dyspneic upon standing. Will ambulate with O2 monitoring.  On review of old records, pt had and ECHO in 09/2015 which showed preserved EF at 60-65%, LVH with normal LV diastolic function.  Nursing update. LE [name] negative for DVT.   2:55 PM Nursing update. Pt visibly dyspneic and has subjective SOB while sitting on the side of the bed. Is also reporting increased SOB and is visibly dyspneic with ambulation. Maintained O2 sats to 97% on RA.   Dr. Sherry Ruffing Discussed pt case with hospitalist and pt will be admitted for concern for new CHF.   4:30 PM Rechecked pt. He is now c/o epigastric abd pain when he coughs. Lipase ordered. Discussed labs, imaging, and plan for admission. Pt is agreeable to the plan and has no further questions.    Final Clinical Impressions(s) / ED Diagnoses   Final diagnoses:  Hypervolemia, unspecified hypervolemia type   57 year old male with worsening dyspnea and LE edema (L>R). LLE [name] negative for DVT. Has elevated BNP today and CXR with cardiomegaly, vascular congestion, and sub-segmental atelectasis in the left midlung.  No history of heart failure.  EKG with without ST elevation or [place] inversion to  suggest ischemia. Trop negative. No CP. Pt took 20 mg lasix at home  and was given 1 dose IV lasix here. Will admit for further workup of new onset CHF.  ED Discharge Orders    None      Bishop [place] 08/04/17 2356  Tegeler, Gwenyth Allegra, MD 08/05/17 [phone]

## 2017-08-04 NOTE — Progress Notes (Signed)
[name] is a 57 y.o. male patient admitted from ED awake, alert - oriented  X 4 - no acute distress noted.  VSS - Blood pressure (!) 152/117, pulse 84, temperature 100.3 F (37.9 C), temperature source Oral, resp. rate (!) 28, height 6\' 3"  (1.905 m), weight 119 kg (262 lb 5.6 oz), SpO2 96 %.    IV in place, occlusive dsg intact without redness. Provider paged regarding need for droplet precautions, no order at this time.   Orientation to room, and floor completed with information packet given to patient/family.  Patient declined safety video at this time.  Admission INP armband ID verified with patient, with patient and family able to verbalize understanding of risk associated with falls, and verbalized understanding to call nsg before up out of bed.  Call light within reach, patient able to voice, and demonstrate understanding.      Will cont to eval and treat per MD orders.  [place], RN 08/04/2017 6:59 PM

## 2017-08-04 NOTE — H&P (Signed)
Date: 08/04/2017               Patient Name:  [name] MRN: [id number]  DOB: [birth year] Age / Sex: 57 y.o., male   PCP: Ladell Pier, MD         Medical Service: Internal Medicine Teaching Service         Attending Physician: Dr. Sherry Ruffing, Gwenyth Allegra, *    First Contact: Dr. Thomasene Ripple Pager: [phone]  Second Contact: Dr. Ledell Noss Pager: [phone]       After Hours (After 5p/  First Contact Pager: [phone]  weekends / holidays): Second Contact Pager: [phone]   Chief Complaint: Cough, SOB, and bilateral leg swelling  History of Present Illness: [name] is a 57 yo with PMH of HTN, DM, HLD, and tobacco, alcohol, and cocaine use who presents for evaluation of bilateral lower extremity swelling, cough, and dyspnea on exertion. The patient states that he first noticed his bilateral leg swelling about 3 weeks ago. He thinks that his legs are heavy and they feel overall weak. He has noticed that they are tender to the touch. He is unsure what initially started the lower extremity swelling but thinks that it has gotten worse as time has gone on. Over the past 4 days he has noticed a productive cough with clear sputum. He also has noticed some difficulty walking around, stating that he feels like he has to stop and take a break to gather himself even when walking a few steps. Last night he noticed his cough and productive sputum was worse while sleeping and he felt himself waking up gasping for air. He usually sleeps on 1 pillow at home and thinks that he does OK with this at baseline. Patient denies chest pain, fevers, chills, diaphoresis, nausea, vomiting, change in baseline bowel/bladder habits, and recent weight gain. Because of his constellation of symptoms, in addition to their worsening intensity, he went to his PCP for evaluation yesterday. Per chart review he was diagnosed with a URI and given lasix 20 mg to help with bilateral lower extremity swelling. He says he  took this medication this morning and can't tell if it has helped.   At baseline the patient states he doesn't like to take his medications because he says he is on too many. He states that he has an aide who comes to help him eat, dress, and grocery shop but this aide does not help him with his medications. On average the patient thinks that he takes his medications about 1 time per week and definitely doesn't take them every day.   In the ED the patient was found to be hypertensive to 150-170s/60-70s, non-tachycardic, tachypneic, but saturating >94% on room air. The patient was found to have a BMP and CBC that was within normal limits. He had mild proteinuria and a BNP = 310.6. ISTAT troponin = 0.03, lactic acid = 0.89. Given his complaints of bilateral lower extremity edema (L>R), the patient had lower extremity ultrasound and no DVT was observed. His EKG was without ST elevation or T wave inversion to suggest ischemia. His chest X ray showed vascular congestion consistent with pulmonary edema. He received 1 dose of IV lasix 20 mg and internal medicine teaching service was called for admission.   Meds:  Current Meds  Medication Sig  . furosemide (LASIX) 20 MG tablet 1 tab PO daily x 3 days then 1/2 tab daily (Patient taking differently: Take 20 mg by mouth  See admin instructions. 1 tab PO daily x 3 days then 1/2 tab daily)  . gabapentin (NEURONTIN) 300 MG capsule TAKE 1 CAPSULE (300 MG TOTAL) BY MOUTH THREE TIMES DAILY.  Marland Kitchen glipiZIDE (GLIPIZIDE XL) 10 MG 24 hr tablet Take 1 tablet (10 mg total) by mouth daily with breakfast.  . IBU 800 MG tablet Take 800 mg by mouth as needed for pain.  Marland Kitchen lisinopril (PRINIVIL,ZESTRIL) 40 MG tablet Take 1 tablet (40 mg total) by mouth daily.  . mupirocin ointment (BACTROBAN) 2 % Apply to affected area BID   Allergies: Allergies as of 08/04/2017 - Review Complete 08/04/2017  Allergen Reaction Noted  . Amlodipine Swelling 03/27/2017   Past Medical History: Past  Medical History:  Diagnosis Date  . Alcohol abuse   . Chronic arm pain   . Chronic leg pain   . Chronic pain   . Cocaine abuse ([place])   . Diabetes mellitus   . Gait instability   . Hypertension   . 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 Peripheral edema    chronic LUE  . Tobacco abuse    Past Surgical History: Past Surgical History:  Procedure Laterality Date  . CARDIAC CATHETERIZATION  12/27/2013  . CARDIAC CATHETERIZATION  12/27/2013   Impression: 1. 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 Moderate disease in the small caliber intermediate branch, 3. Normal LV systolic function  . left arm skin graft    . LEFT HEART CATHETERIZATION WITH CORONARY ANGIOGRAM Bilateral 12/26/2013   Procedure: LEFT HEART CATHETERIZATION WITH CORONARY ANGIOGRAM;  Surgeon: Burnell Blanks, MD;  Location: [facility] CATH LAB;  Service: Cardiovascular;  Laterality: Bilateral;   Family History:  Family History  Problem Relation Age of Onset  . Hypertension Mother   . Diabetes Mother   . Cancer Mother        breast cancer   . Hypertension Father   . Heart disease Father   . Stroke Father   . Hypertension Sister    Social History:  Patient lives alone, has aide that helps with activities of daily living such as showering, dressing, grocery shopping. Patient completed 10th grade education. Patient reports history of smoking 1 pack of cigarettes every 2-3 days for a few years. Also endorses remote history of daily 40 oz beer intake, but has cut back in recent years. Stopped drinking officially at the beginning of this year. Endorses crack cocaine use 1x per month for a few years, but states he stopped officially at the beginning of this year.  Review of Systems: A complete ROS was negative except as per HPI.  Physical Exam: Blood pressure (!) 151/60, pulse 73, temperature 100.3 F (37.9 C), temperature source Oral, resp. rate (!) 24, height 6\' 3"  (1.905 m), weight 272 lb (123.4 kg), SpO2  96 %.   Physical Exam  Constitutional: He appears well-developed and well-nourished. No distress.  HENT:  Mouth/Throat: Oropharynx is clear and moist.  Cardiovascular: Normal rate and regular rhythm. Exam reveals no friction rub.  No murmur heard. 2+ radial, dorsalis pedis bilaterally  Respiratory:  Speaking in full sentences. No accessory muscle use or nasal flaring. Intermittent end expiratory wheezing and bibasilar crackles appreciated.   GI: Soft. He exhibits no distension. There is no tenderness. There is no rebound.  Musculoskeletal: He exhibits edema (Trace pitting edema to knees bilaterally (subjectively L>R)) and tenderness (with palpation of bilateral lower extremities).  Skin: Skin is warm and dry.  Chronic hyperpigmentation on bilateral  lower extremities with dry skin, loss of hair but no ulceration. Some erythema on L>R, no warmth.   Psychiatric: He has a normal mood and affect. Judgment and thought content normal.   EKG: personally reviewed my interpretation is sinus rhythm with possible LAD and LVH. No signs of ST elevation and/or T wave inversion to suggest ischemia  CXR: personally reviewed my interpretation is cardiomegaly with pulmonary congestion. No pleural effusions or acute infiltrations  Assessment & Plan by Problem: Active Problems:   * No active hospital problems. *  [name] is a 57 yo with PMH of HTN, DM, HLD, and tobacco, alcohol, and cocaine use who presents for evaluation of bilateral lower extremity swelling, cough, and dyspnea on exertion. In the ED he was found to have vascular congestion on chest imaging and elevated BNP, suggestive of acute CHF exacerbation. He was admitted to the internal medicine teaching service for management. The specific problems addressed during admission were as follows:  Dyspnea on exertion, cough: Patient presented with signs and symptoms of volume overload, concerning for CHF. He has multiple risk factors for CHF  including HTN, HLD, and T2DM. Per chart review most recent echocardiography in 09/2015 was consistent with LVH and EF of 60-65%. It did not reveal systolic or diastolic dysfunction. Will need obtain new echo to assess for change in systolic or diastolic function since >7.8 years ago. Per chart review, the patient also had a L/R heart cath in 2015 which showed some CAD in RCA that patient has been medically managed with daily aspirin. Given that the patient doesn't comply with prescription medications, it's possible that the patients underlying CAD has worsened, thus contributing to ischemic heart disease. Patient denies history of MI and chest pain with exertion, making ischemic heart disease less likely. Nevertheless, if patient does indeed have CHF on echo, will need to cardiology for ischemic evaluation. It is also possible that longstanding HTN is contributing, as review of past PCP notes shows uncontrolled HTN during those visits (October BP =170/90, 08/04/2017 BP =206/93). This is also supported by the LAD and LVH on admission EKG. Patient s/p IV lasix 20 mg in ED with symptomatic improvement. Since patient took PO lasix today and received one IV dose in ED, will hold off redosing and assess volume status in AM. The patient's dyspnea and cough are unlikely to be a result of PE, as the patient does not have increased oxygen requirement, had a negative LE doppler in the ED, and does not currently have tachycardia or other signs to suggest this diagnosis. The patient's dyspnea on exertion and cough is less likely to be a result of COPD exacerbation, even though intermittent wheezing was observed on exam. The patient's cough is not productive of a green sputum, he is afebrile, does not have a leukocytosis, and does not have a history of COPD. Will continue to monitor for signs and symptoms of infection while inpatient.  -Admit to med-surg -Echocardiography in AM  -IV lasix 20 mg daily -Supportive care for  cough -Daily weights -Strict I & Os -Daily BMP while on lasix to assess for electrolytes, renal function  CAD: Patient had L/R heart cath in 2015 that showed mild disease in RCA and moderate disease in small caliber intermediate branch. Unclear from history if disease has advanced or not, but patient is not compliant with medications at baseline, so worsening of CAD is a possibility.  -Continue daily aspirin 81 mg  HTN: Maintained on lisinopril 40 mg, recently started on  lasix 20 mg with slow taper by PCP. Patient's BP elevated at visits in October (170/90) and at most recent visit on 08/04/2017 (206/93). Will need to monitor while inpatient and likely restart additional medications during hospitalization pending cardiology workup as above. -Continue home lisinopril 40 mg daily -Hydralazine 5 mg for SBP >180 q4 hours PRN  DM: On metformin 1000 mg BID, glipizide 10 mg at home. Last A1C = 7.6%, suggesting modest control at baseline.  -Continue glipizide 10 mg daily  HLD:  Continue home Atorvastatin 40 mg.  FEN/GI: -Heart Healthy/Carb Modified -No IVF, replace electrolytes as needed  VTE Prophylaxis: Lovenox daily Code Status: Full  Dispo: Admit patient to Observation with expected length of stay less than 2 midnights.  SignedThomasene Ripple, MD 08/04/2017, 5:01 PM  Pager: [phone]

## 2017-08-04 NOTE — Progress Notes (Signed)
Pt temp 101, BP 175/60. No PRN meds ordered. MD notified.

## 2017-08-04 NOTE — Progress Notes (Signed)
VASCULAR LAB PRELIMINARY  PRELIMINARY  PRELIMINARY  PRELIMINARY  Left lower extremity venous duplex completed.    Preliminary report:  There is no DVT or SVT noted in the left lower extremity.  Bilateral enlarged inguinal lymph nodes noted.  Attempted to call report to [place], but no answer.   Gave report to RN.   Tris Howell, RVT 08/04/2017, 1:17 PM

## 2017-08-05 ENCOUNTER — Encounter (HOSPITAL_COMMUNITY): Payer: Self-pay

## 2017-08-05 ENCOUNTER — Other Ambulatory Visit: Payer: Self-pay

## 2017-08-05 ENCOUNTER — Inpatient Hospital Stay (HOSPITAL_COMMUNITY): Payer: Medicare Other

## 2017-08-05 DIAGNOSIS — F1490 Cocaine use, unspecified, uncomplicated: Secondary | ICD-10-CM

## 2017-08-05 DIAGNOSIS — L03115 Cellulitis of right lower limb: Secondary | ICD-10-CM

## 2017-08-05 DIAGNOSIS — I2510 Atherosclerotic heart disease of native coronary artery without angina pectoris: Secondary | ICD-10-CM

## 2017-08-05 DIAGNOSIS — R05 Cough: Secondary | ICD-10-CM

## 2017-08-05 DIAGNOSIS — E119 Type 2 diabetes mellitus without complications: Secondary | ICD-10-CM

## 2017-08-05 DIAGNOSIS — Z79899 Other long term (current) drug therapy: Secondary | ICD-10-CM

## 2017-08-05 DIAGNOSIS — F1099 Alcohol use, unspecified with unspecified alcohol-induced disorder: Secondary | ICD-10-CM

## 2017-08-05 DIAGNOSIS — Z720 Tobacco use: Secondary | ICD-10-CM

## 2017-08-05 DIAGNOSIS — L03116 Cellulitis of left lower limb: Secondary | ICD-10-CM

## 2017-08-05 DIAGNOSIS — E785 Hyperlipidemia, unspecified: Secondary | ICD-10-CM

## 2017-08-05 DIAGNOSIS — Z7982 Long term (current) use of aspirin: Secondary | ICD-10-CM

## 2017-08-05 DIAGNOSIS — R0600 Dyspnea, unspecified: Secondary | ICD-10-CM

## 2017-08-05 DIAGNOSIS — I878 Other specified disorders of veins: Secondary | ICD-10-CM

## 2017-08-05 DIAGNOSIS — Z7984 Long term (current) use of oral hypoglycemic drugs: Secondary | ICD-10-CM

## 2017-08-05 DIAGNOSIS — I10 Essential (primary) hypertension: Secondary | ICD-10-CM

## 2017-08-05 HISTORY — PX: TRANSTHORACIC ECHOCARDIOGRAM: SHX275

## 2017-08-05 LAB — BASIC METABOLIC PANEL
Anion gap: 10 (ref 5–15)
BUN: 14 mg/dL (ref 6–20)
CO2: 25 mmol/L (ref 22–32)
Calcium: 8.9 mg/dL (ref 8.9–10.3)
Chloride: 103 mmol/L (ref 101–111)
Creatinine, Ser: 1.1 mg/dL (ref 0.61–1.24)
GFR calc Af Amer: >60 mL/min (ref >60)
GLUCOSE: 123 mg/dL — AB (ref 65–99)
Potassium: 4.2 mmol/L (ref 3.5–5.1)
Sodium: 138 mmol/L (ref 135–145)

## 2017-08-05 LAB — ECHOCARDIOGRAM COMPLETE
Height: 75 in
Weight: 4148.18 oz

## 2017-08-05 MED ORDER — SALINE SPRAY 0.65 % NA SOLN
1.0000 | NASAL | Status: DC | PRN
  Administered 2017-08-06: 1 via NASAL
  Filled 2017-08-05 (×2): qty 44

## 2017-08-05 MED ORDER — CEPHALEXIN 500 MG PO CAPS
500.0000 mg | ORAL_CAPSULE | Freq: Four times a day (QID) | ORAL | Status: DC
  Administered 2017-08-05 – 2017-08-07 (×9): 500 mg via ORAL
  Filled 2017-08-05 (×9): qty 1

## 2017-08-05 MED ORDER — IPRATROPIUM-ALBUTEROL 0.5-2.5 (3) MG/3ML IN SOLN: 3.0000 mL | RESPIRATORY_TRACT | Status: DC | PRN

## 2017-08-05 MED ORDER — LISINOPRIL 40 MG PO TABS
40.0000 mg | ORAL_TABLET | Freq: Every day | ORAL | Status: DC
  Administered 2017-08-05 – 2017-08-07 (×3): 40 mg via ORAL
  Filled 2017-08-05 (×3): qty 1

## 2017-08-05 NOTE — Progress Notes (Signed)
Subjective:  Patient seen laying comfortably in bed this AM in no acute distress. States that he continues to feel weak while walking around. He thinks his cough has improved from admission. No other acute complaints. All questions were answered to his satisfaction.  Objective:  Vital signs in last 24 hours: Vitals:   08/05/17 0036 08/05/17 0500 08/05/17 0542 08/05/17 0909  BP:   (!) 156/77 (!) 162/73  Pulse:   63 73  Resp:   18 20  Temp: 99.6 F (37.6 C)  99 F (37.2 C) 98.9 F (37.2 C)  TempSrc: Oral  Oral Oral  SpO2:   98% 96%  Weight:  259 lb 4.2 oz (117.6 kg)    Height:       Physical Exam  Constitutional: He appears well-developed and well-nourished. No distress.  HENT:  Mouth/Throat: Oropharynx is clear and moist.  Cardiovascular: Normal rate and regular rhythm. Exam reveals no friction rub.  No murmur heard. 2+ radial pulses bilaterally  Respiratory:  No accessory muscle use or nasal flaring. End expiratory wheezing and bibasilar crackles.   GI: Soft. Bowel sounds are normal. He exhibits no distension. There is no tenderness. There is no rebound.  Musculoskeletal: He exhibits edema (1+ edema of bilateral lower extremities). He exhibits no tenderness (1+ edema of bilateral lower extremities).  Skin: Skin is warm and dry.  Hyperpigmentation with chronic skin changes of bilateral lower extremities consistent with venous stasis. Some increased erythema of LLE compared to RLE. No significant difference in temperature between the two extremities.  Psychiatric: He has a normal mood and affect. His behavior is normal. Judgment normal.   Assessment/Plan:  Active Problems:   Volume overload  [name] is a 57 yo with PMH of HTN, DM, HLD, and tobacco, alcohol, and cocaine use who presents for evaluation of bilateral lower extremity swelling, cough, and dyspnea on exertion. In the ED he was found to have vascular congestion on chest imaging and elevated BNP, suggestive of  acute CHF exacerbation. He was admitted to the internal medicine teaching service for management. The specific problems addressed during admission were as follows:  Dyspnea on exertion, cough: Patient continues to endorse feeling overall weak when moving around his room, citing continued bilateral LE swelling and difficulty breathing as contributors to this problem. Patient states that cough has improved with diuresis. Patient -1.7L yesterday with IV lasix 20 mg given in ED. Will continue with IV diuresis today given exam and continued symptoms.  -F/u Echocardiography in AM  -IV lasix 20 mg BID -Daily weights -Strict I & Os -Daily BMP while on lasix to assess for electrolytes, renal function  Nonpurulent LE cellulitis: Patient had fever overnight to 101 which responded to PRN Tylenol. Currently afebrile. He states his cough on admission is actually improving with diuresis, so doubt URI contributing. Exam shows some increased erythema of LLE compared to RLE. Will treat for non-purulent cellulitis and continue to monitor clinically. -Keflex, 500 mg QID for 5 days total  CAD: Noted on L/R heart cath in 2016. No current CP or EKG changes to suggest ischemia.  -Continue daily aspirin 81 mg  HTN: Maintained on lisinopril 40 mg. Currently hypertensive. Will continue adding additional medications pending CHF workup. -Continue home lisinopril 40 mg daily -Hydralazine 5 mg for SBP >180 q4 hours PRN  DM: On metformin 1000 mg BID, glipizide 10 mg at home. Last A1C = 7.6%, suggesting modest control at baseline.  -Continue glipizide 10 mg daily  HLD:  Continue  home Atorvastatin 40 mg.  FEN/GI: -Heart Healthy/Carb Modified -No IVF, replace electrolytes as needed  VTE Prophylaxis: Lovenox daily Code Status: Full  Dispo: Anticipated discharge in approximately 2-3 day(s).   Curtis Ripple, MD 08/05/2017, 10:46 AM Pager: [phone]

## 2017-08-05 NOTE — Progress Notes (Signed)
  Date: 08/05/2017  Patient name: [name]  Medical record number: [id number]  Date of birth: October 27, 1960   I have seen and evaluated this patient and I have discussed the plan of care with the house staff. Please see their note for complete details. I concur with their findings with the following additions/corrections:   57 year old man admitted with leg swelling, cough, and dyspnea on exertion. Primary concern on presentation his new onset heart failure, although COPD, and viral infection are also considerations. Today, reports feeling somewhat weaker. Still with evidence of volume overload on exam with crackles and lower extremity edema. We'll continue diuresis today and obtain an echocardiogram to further evaluate his heart function. Although he is not hypoxic, he requires IV diuresis given that he was not responding well to oral diuresis as an outpatient, likely due to gut edema. Unfortunately, if this is a new onset chronic conditions such as heart failure, management will be difficult given his overall unwillingness to take medications.  Oda Kilts, MD 08/05/2017, 2:53 PM

## 2017-08-06 ENCOUNTER — Inpatient Hospital Stay (HOSPITAL_COMMUNITY): Payer: Medicare Other

## 2017-08-06 DIAGNOSIS — I110 Hypertensive heart disease with heart failure: Principal | ICD-10-CM

## 2017-08-06 DIAGNOSIS — L03116 Cellulitis of left lower limb: Secondary | ICD-10-CM

## 2017-08-06 DIAGNOSIS — I5031 Acute diastolic (congestive) heart failure: Secondary | ICD-10-CM

## 2017-08-06 DIAGNOSIS — F1010 Alcohol abuse, uncomplicated: Secondary | ICD-10-CM

## 2017-08-06 DIAGNOSIS — J208 Acute bronchitis due to other specified organisms: Secondary | ICD-10-CM

## 2017-08-06 DIAGNOSIS — F17210 Nicotine dependence, cigarettes, uncomplicated: Secondary | ICD-10-CM

## 2017-08-06 DIAGNOSIS — R0989 Other specified symptoms and signs involving the circulatory and respiratory systems: Secondary | ICD-10-CM

## 2017-08-06 DIAGNOSIS — F1410 Cocaine abuse, uncomplicated: Secondary | ICD-10-CM

## 2017-08-06 LAB — BASIC METABOLIC PANEL
Anion gap: 10 (ref 5–15)
BUN: 17 mg/dL (ref 6–20)
CO2: 22 mmol/L (ref 22–32)
Calcium: 8.5 mg/dL — ABNORMAL LOW (ref 8.9–10.3)
Chloride: 102 mmol/L (ref 101–111)
Creatinine, Ser: 1.11 mg/dL (ref 0.61–1.24)
GFR calc Af Amer: >60 mL/min (ref >60)
GFR calc non Af Amer: >60 mL/min (ref >60)
Glucose, Bld: 126 mg/dL — ABNORMAL HIGH (ref 65–99)
Potassium: 4.2 mmol/L (ref 3.5–5.1)
Sodium: 134 mmol/L — ABNORMAL LOW (ref 135–145)

## 2017-08-06 MED ORDER — FUROSEMIDE 20 MG PO TABS
20.0000 mg | ORAL_TABLET | Freq: Two times a day (BID) | ORAL | Status: DC
  Administered 2017-08-06 – 2017-08-07 (×2): 20 mg via ORAL
  Filled 2017-08-06 (×2): qty 1

## 2017-08-06 MED ORDER — BENZONATATE 100 MG PO CAPS
100.0000 mg | ORAL_CAPSULE | Freq: Three times a day (TID) | ORAL | Status: DC | PRN
  Administered 2017-08-06: 100 mg via ORAL
  Filled 2017-08-06: qty 1

## 2017-08-06 NOTE — Progress Notes (Signed)
Medicine attending: I examined this patient today together with resident physician Dr. Thomasene Ripple and I concur with her evaluation and management plan which we discussed together.  58 year old with hypertension, noncritical coronary artery disease documented on 2015 heart cath, type 2 diabetes on oral agents,  hyperlipidemia and polysubstance abuse, including cocaine,  who presented on January 12 with who presented with increasing lower extremity edema, generalized weakness, acute onset of a productive cough and dyspnea on exertion then paroxysmal nocturnal dyspnea with findings to date suggestive of early diastolic heart failure with chest x-ray showing cardiomegaly and pulmonary vascular congestion, elevation of BNP at 311, and an echocardiogram with preserved left ventricular function EF 55% but new grade 1 diastolic dysfunction compared with a March 2017 study. He is responding well to diuretic therapy and blood pressure control. Etiology of his diastolic dysfunction is likely early hypertensive cardiomyopathy exacerbated by an acute viral bronchitis and ongoing tobacco abuse. Additional problems addressed during current admission include early cellulitis of the left tibia area which already appears to be resolving on oral Keflex. We will continue current management.  Anticipate discharge soon.

## 2017-08-06 NOTE — Plan of Care (Signed)
Patient progressing throughout the shift. NAD noted.

## 2017-08-06 NOTE — Evaluation (Signed)
Physical Therapy Evaluation Patient Details Name: [name] MRN: [id number] DOB: 07-22-1961 Today's Date: 08/06/2017   History of Present Illness  57 year old male with worsening dyspnea and LE edema (L>R). LLE [name] negative for DVT. Has elevated BNP today and CXR with cardiomegaly, vascular congestion, and sub-segmental atelectasis in the left midlung  Clinical Impression  Orders received for PT evaluation. Patient demonstrates deficits in functional mobility as indicated below. Will benefit from continued skilled PT to address deficits and maximize function. Will see as indicated and progress as tolerated.      Follow Up Recommendations No PT follow up;Supervision - Intermittent    Equipment Recommendations  None recommended by PT    Recommendations for Other Services       Precautions / Restrictions Precautions Precautions: None Restrictions Weight Bearing Restrictions: No      Mobility  Bed Mobility Overal bed mobility: Modified Independent             General bed mobility comments: increased time to perform  Transfers Overall transfer level: Modified independent Equipment used: Quad cane             General transfer comment: Increased time and effort to come to standing, no physical assist required  Ambulation/Gait Ambulation/Gait assistance: Supervision Ambulation Distance (Feet): 130 Feet Assistive device: Quad cane Gait Pattern/deviations: Shuffle;Decreased stride length;Step-to pattern Gait velocity: decreased Gait velocity interpretation: Below normal speed for age/gender General Gait Details: patient with shuffling gait, VCs for increased cadence and stride. One standing rest breakl  Stairs            Wheelchair Mobility    Modified Rankin (Stroke Patients Only)       Balance Overall balance assessment: Needs assistance Sitting-balance support: Feet supported Sitting balance-Leahy Scale: Good     Standing balance support:  During functional activity Standing balance-Leahy Scale: Fair Standing balance comment: using cane for support                             Pertinent Vitals/Pain Pain Assessment: No/denies pain    Home Living Family/patient expects to be discharged to:: Private residence Living Arrangements: Alone Available Help at Discharge: Family;Friend(s);Available PRN/intermittently Type of Home: Apartment Home Access: Level entry     Home Layout: One level Home Equipment: Walker - 2 wheels;Cane - single point;Cane - quad      Prior Function Level of Independence: Independent with assistive device(s)         Comments: utilizes a quad cane for mobility     Hand Dominance   Dominant Hand: Right    Extremity/Trunk Assessment   Upper Extremity Assessment Upper Extremity Assessment: Overall WFL for tasks assessed    Lower Extremity Assessment Lower Extremity Assessment: Generalized weakness       Communication   Communication: No difficulties  Cognition Arousal/Alertness: Awake/alert Behavior During Therapy: WFL for tasks assessed/performed Overall Cognitive Status: No family/caregiver present to determine baseline cognitive functioning                                        General Comments      Exercises     Assessment/Plan    PT Assessment Patient needs continued PT services  PT Problem List Decreased activity tolerance;Decreased balance;Cardiopulmonary status limiting activity       PT Treatment Interventions DME instruction;Gait training;Functional mobility training;Therapeutic activities;Therapeutic exercise;Balance  training;Patient/family education    PT Goals (Current goals can be found in the Care Plan section)  Acute Rehab PT Goals Patient Stated Goal: to geel better PT Goal Formulation: With patient Time For Goal Achievement: 08/20/17 Potential to Achieve Goals: Good    Frequency Min 3X/week   Barriers to discharge         Co-evaluation               AM-PAC PT "6 Clicks" Daily Activity  Outcome Measure Difficulty turning over in bed (including adjusting bedclothes, sheets and blankets)?: None Difficulty moving from lying on back to sitting on the side of the bed? : None Difficulty sitting down on and standing up from a chair with arms (e.g., wheelchair, bedside commode, etc,.)?: A Little Help needed moving to and from a bed to chair (including a wheelchair)?: A Little Help needed walking in hospital room?: A Little Help needed climbing 3-5 steps with a railing? : A Lot 6 Click Score: 19    End of Session Equipment Utilized During Treatment: Gait belt Activity Tolerance: Patient tolerated treatment well Patient left: in bed Nurse Communication: Mobility status PT Visit Diagnosis: Unsteadiness on feet (R26.81);Apraxia (R48.2)    Time: 3244-0102 PT Time Calculation (min) (ACUTE ONLY): 21 min   Charges:   PT Evaluation $PT Eval Moderate Complexity: 1 Mod     PT G Codes:        [name], PT DPT  Board Certified Neurologic Specialist [phone]   [name] 08/06/2017, 9:37 AM

## 2017-08-06 NOTE — Progress Notes (Signed)
Subjective:  Patient seen sleeping comfortably in bed this AM, easily awoken. The patient states he continues to have cough but thinks that his lower extremity swelling has improved from yesterday.  Objective:  Vital signs in last 24 hours: Vitals:   08/05/17 0909 08/05/17 1331 08/05/17 2231 08/06/17 0500  BP: (!) 162/73 (!) 171/68 (!) 133/42 139/69  Pulse: 73 82 73 72  Resp: 20 (!) 24 (!) 24 18  Temp: 98.9 F (37.2 C) 99.4 F (37.4 C) 100 F (37.8 C) 99.1 F (37.3 C)  TempSrc: Oral Oral Oral Oral  SpO2: 96% 95% 95% 94%  Weight:    255 lb 4.8 oz (115.8 kg)  Height:       Physical Exam  Constitutional: He appears well-developed and well-nourished. No distress.  HENT:  Mouth/Throat: Oropharynx is clear and moist.  Cardiovascular: Normal rate and regular rhythm. Exam reveals no friction rub.  No murmur heard. 2+ radial pulses bilaterally  Respiratory:  No accessory muscle use or nasal flaring. Minimal end expiratory wheezing and bibasilar crackles, interval improvement from yesterday's exam.  GI: Soft. Bowel sounds are normal. He exhibits no distension. There is no tenderness. There is no rebound.  Musculoskeletal: He exhibits no edema (1+ edema of bilateral lower extremities) or tenderness (1+ edema of bilateral lower extremities).  Skin: Skin is warm and dry.  Hyperpigmentation with chronic skin changes of bilateral lower extremities consistent with venous stasis. Some increased erythema of LLE compared to RLE. No significant difference in temperature between the two extremities.  Psychiatric: He has a normal mood and affect. His behavior is normal. Judgment normal.   Assessment/Plan:  Principal Problem:   Acute pulmonary edema (HCC) Active Problems:   Hyperlipidemia associated with type 2 diabetes mellitus (HCC)   Essential hypertension   Cocaine abuse ([place])   Diabetes mellitus type 2 with complications (HCC)   Edema of both legs  Ryan Hausner is a 57 yo with PMH  of HTN, DM, HLD, and tobacco, alcohol, and cocaine use who presents for evaluation of bilateral lower extremity swelling, cough, and dyspnea on exertion. In the ED he was found to have vascular congestion on chest imaging and elevated BNP, suggestive of acute CHF exacerbation. He was admitted to the internal medicine teaching service for management. The specific problems addressed during admission were as follows:  Acute CHF exacerbation (echo 2019 with EF 55%, G1DD): New diagnosis for the patient. His echo yestereday was a technically difficult study but did reveal grade 1 diastolic dysfunction not observed in echo obtained in 09/2015. Patient -2.0L yesterday with IV lasix 20 mg BID. Will continue with oral diuresis today given improvement in exam. Patient continues to have weakness. Will get PT consult today. -Oral lasix 20 mg BID -Daily weights -Strict I & Os -Daily BMP while on lasix to assess for electrolytes, renal function -PT evaluation today  Nonpurulent LE cellulitis: Patient has remained afebrile for 24 hours. Patient's exam without signs of erythema. Will continue treatment for LLE cellulitis. -Keflex, 500 mg QID for 5 days total  CAD: Noted on L/R heart cath in 2016. No current CP or EKG changes to suggest ischemia on admission. Echo could not rule out regional wall motion abnormalities.  -Continue daily aspirin 81 mg  HTN: Maintained on lisinopril 40 mg. Currently normotensive since restarting this regimen. -Continue home lisinopril 40 mg daily -Hydralazine 5 mg for SBP >180 q4 hours PRN  DM: On metformin 1000 mg BID, glipizide 10 mg at home. Last A1C =  7.6%, suggesting modest control at baseline. Last glucose from this morning =126.  -Continue glipizide 10 mg daily  HLD:  Continue home Atorvastatin 40 mg.  FEN/GI: -Heart Healthy/Carb Modified -No IVF, replace electrolytes as needed  VTE Prophylaxis: Lovenox daily Code Status: Full  Dispo: Anticipated discharge in  approximately 1-2 day(s).   Thomasene Ripple, MD 08/06/2017, 7:41 AM Pager: [phone]

## 2017-08-07 DIAGNOSIS — Z9114 Patient's other noncompliance with medication regimen: Secondary | ICD-10-CM

## 2017-08-07 DIAGNOSIS — Z888 Allergy status to other drugs, medicaments and biological substances status: Secondary | ICD-10-CM

## 2017-08-07 DIAGNOSIS — R059 Cough, unspecified: Secondary | ICD-10-CM

## 2017-08-07 DIAGNOSIS — R05 Cough: Secondary | ICD-10-CM

## 2017-08-07 LAB — BASIC METABOLIC PANEL
Anion gap: 11 (ref 5–15)
BUN: 19 mg/dL (ref 6–20)
CO2: 22 mmol/L (ref 22–32)
Calcium: 8.4 mg/dL — ABNORMAL LOW (ref 8.9–10.3)
Chloride: 103 mmol/L (ref 101–111)
Creatinine, Ser: 1.17 mg/dL (ref 0.61–1.24)
GFR calc Af Amer: >60 mL/min (ref >60)
GFR calc non Af Amer: >60 mL/min (ref >60)
Glucose, Bld: 97 mg/dL (ref 65–99)
Potassium: 3.7 mmol/L (ref 3.5–5.1)
Sodium: 136 mmol/L (ref 135–145)

## 2017-08-07 MED ORDER — CEPHALEXIN 500 MG PO CAPS: 500.0000 mg | ORAL_CAPSULE | Freq: Four times a day (QID) | ORAL | 0 refills | Status: AC

## 2017-08-07 MED ORDER — AZITHROMYCIN 250 MG PO TABS: ORAL_TABLET | ORAL | 0 refills | Status: DC

## 2017-08-07 MED ORDER — FUROSEMIDE 20 MG PO TABS: 20.0000 mg | ORAL_TABLET | Freq: Every morning | ORAL | 6 refills | Status: DC

## 2017-08-07 NOTE — Discharge Summary (Signed)
Name: [name] MRN: [id number] DOB: 23-Mar-1961 57 y.o. PCP: Ladell Pier, MD  Date of Admission: 08/04/2017 11:03 AM Date of Discharge: 08/07/2017 Attending Physician: Dr. Murriel Hopper  Discharge Diagnosis:  Principal Problem:   Acute diastolic congestive heart failure ([name]) Active Problems:   Hyperlipidemia associated with type 2 diabetes mellitus ([name])   Essential hypertension   Cocaine abuse ([name])   Diabetes mellitus type 2 with complications ([name])   Edema of both legs   Acute pulmonary edema (HCC)   Cellulitis of left lower extremity  Discharge Medications: Allergies as of 08/07/2017      Reactions   Amlodipine Swelling   Lower extremity swelling      Medication List    TAKE these medications   aspirin EC 81 MG tablet Take 1 tablet (81 mg total) by mouth daily.   atorvastatin 40 MG tablet Commonly known as:  LIPITOR Take 1 tablet (40 mg total) by mouth daily.   azithromycin 250 MG tablet Commonly known as:  ZITHROMAX Z-PAK Take 2 tablets on day 1 (total of 500 mg). Then take 1 tablet for the next 4 days until all pills are gone.   cephALEXin 500 MG capsule Commonly known as:  KEFLEX Take 1 capsule (500 mg total) by mouth every 6 (six) hours for 3 days.   furosemide 20 MG tablet Commonly known as:  LASIX Take 1 tablet (20 mg total) by mouth every morning. What changed:    how much to take  how to take this  when to take this  additional instructions   gabapentin 300 MG capsule Commonly known as:  NEURONTIN TAKE 1 CAPSULE (300 MG TOTAL) BY MOUTH THREE TIMES DAILY.   glipiZIDE 10 MG 24 hr tablet Commonly known as:  GLIPIZIDE XL Take 1 tablet (10 mg total) by mouth daily with breakfast.   glucose blood test strip Commonly known as:  TRUE METRIX BLOOD GLUCOSE TEST Use as instructed   IBU 800 MG tablet Generic drug:  ibuprofen Take 800 mg by mouth as needed for pain.   lisinopril 40 MG tablet Commonly known as:   PRINIVIL,ZESTRIL Take 1 tablet (40 mg total) by mouth daily.   metFORMIN 1000 MG tablet Commonly known as:  GLUCOPHAGE Take 1 tablet (1,000 mg total) by mouth 2 (two) times daily with a meal.   mupirocin ointment 2 % Commonly known as:  BACTROBAN Apply to affected area BID   onetouch ultrasoft lancets Use as instructed   TRUEPLUS LANCETS 28G Misc Use as directed      Disposition and follow-up:   Mr.[name] was discharged from [facility] in Stable condition.  At the hospital follow up visit please address:  1.  Patient admitted with progressive leg swelling, shortness of breath, and cough. He was found to have vascular congestion on chest imaging and grade 1 diastolic dysfunction on echocardiography. His symptoms of SOB and LE swelling improved with IV diuresis. He was discharged with lasix 20 mg daily and close follow up with PCP. Ambulatory referral to heart failure clinic made on discharge. Please assess follow up.   Patient had persistent cough in spite of diuresis. He had two fevers while hospitalized. His repeat chest imaging after IV diuresis was concerning for developing infiltrate and patient started on azithromycin (5 days of therapy started on discharge). His lower extremities had signs/symptoms consistent with cellulitis on admission. He was started on Keflex at the beginning of hospitalization and discharged with 3  days of therapy to complete a 5 day total antibiotic course. Please assess LE swelling and pulmonary symptoms while completing above antibiotics.   Patient stated on admission he doesn't like to take medications. His lack of compliance with lisinopril and DM medications likely contribute to his new diagnosis of HF. Please assess patient's ability and willingness to take medications as prescribed.  2.  Labs / imaging needed at time of follow-up: BMP to assess electrolytes while on daily lasix outside of the hospital  3.  Pending labs/ test  needing follow-up: None  Follow-up Appointments: Follow-up Information    Ladell Pier, MD. Schedule an appointment as soon as possible for a visit in 1 week(s).   Specialty:  Internal Medicine Why:  Please follow up in 1 week with your primary care physician regarding your hospitalization. You were started on a new medication, including Lasix, and given antibiotics to treat possible infections. She will need to follow up to see how you are doing. Contact information: [place] [address] [address] Hospital Course by problem list: Principal Problem:   Acute diastolic congestive heart failure (HCC) Active Problems:   Hyperlipidemia associated with type 2 diabetes mellitus ([place])   Essential hypertension   Cocaine abuse ([place])   Diabetes mellitus type 2 with complications (HCC)   Edema of both legs   Acute pulmonary edema (HCC)   Cellulitis of left lower extremity   [name] is a 57 yo with PMH of HTN, DM, HLD, and tobacco, alcohol, and cocaine use who presents for evaluation ofbilateral lower extremity swelling, cough, and dyspnea on exertion.In the ED he was found to have vascular congestion on chest imaging and elevated BNP, suggestive of acute CHF exacerbation. He was admitted to the internal medicine teaching service for management. The specific problems addressed during admission were as follows:  Acute CHF exacerbation (echo 2019 with EF 55%, G1DD): New diagnosis for the patient. His echo during hospitalization was a technically difficult study but did reveal grade 1 diastolic dysfunction not observed in echo obtained in 09/2015. Patients lower extremity swelling and subjective shortness of breath improved with IV diuresis. Patient transitioned to oral lasix 20 mg on HD #3. Overall patient was net -4L during admission. The patient was discharged with daily lasix. Patient was educated about importance of daily weights, low salt diet, and  taking medications for high blood pressure and diabetes every day. He was discharged with ambulatory referral to heart failure clinic for further work up. Patient should undergo stress testing and further education regarding signs/symptoms of heart failure as an outpatient, which can be provided by this clinic. Please facilitate follow up with this specialty clinic for the patient.   The patient continued to endorse productive cough, in spite of diuresis. Repeat chest imaging was completed to ensure improvement in vascular congestion. There was improvement in vascular congestion, however interval increase in interstitium of lower lungs suggested possible developing infiltrate. The patient developed a fever to 101F during hospitalization, which was persistent for 24 hours. Given this fever, his cough, and imaging findings after diuresis, he was started on a 5 day course of azithromycin to cover for atypical infection to be completed as an outpatient. Please assess compliance with this antibiotic regimen and resolution of cough.   Nonpurulent LE cellulitis: Patient's physical exam on admission showed increased LLE erythema and warmth compared to RLE. Patient also developed a fever to 101F during admission.  Given this fever and physical exam findings, the patient was started on Keflex, 500 mg QID for 5 days total. His LLE erythema and warmth improved after initiating this therapy, however his fever persisted (see above). The patient was discharged with antibiotics to complete 5 day course. Please assess antibiotic compliance and resolution of symptoms.   CAD: Mild disease noted on L/R heart cath in 2016. Daily aspirin 81 mg and maximum intensity statin (atorvastatin 80 mg) were continued during hospitalization. Patient was referred to heart failure clinic on discharge. As stated above, would recommend stress testing as part of his work up for diastolic heart failure.  HTN: Patient previously reported taking  lisinopril 40 mg daily for BP control. Patient takes medications, on average, once a week. He arrived hypertensive (SBP 170s) but was normotensive on daily lisinopril 40 and after diuresis. Please assess his compliance with BP medications.  DM: Per chart review, patient on metformin 1000 mg BID, glipizide 10 mg at home.Last A1C = 7.6%, suggesting modest control at baseline.Please assess patient's willingness to take each of these medications on a daily basis.   Discharge Vitals:   BP (!) 146/60 (BP Location: Right Arm)   Pulse 66   Temp 98.6 F (37 C) (Oral)   Resp 18   Ht 6\' 3"  (1.905 m)   Wt 255 lb 11.7 oz (116 kg)   SpO2 96%   BMI 31.96 kg/m   Pertinent Labs, Studies, and Procedures:   BMP Latest Ref Rng & Units 08/07/2017 08/06/2017 08/05/2017  Glucose 65 - 99 mg/dL 97 126(H) 123(H)  BUN 6 - 20 mg/dL 19 17 14   Creatinine 0.61 - 1.24 mg/dL 1.17 1.11 1.10  BUN/Creat Ratio 9 - 20 - - -  Sodium 135 - 145 mmol/L 136 134(L) 138  Potassium 3.5 - 5.1 mmol/L 3.7 4.2 4.2  Chloride 101 - 111 mmol/L 103 102 103  CO2 22 - 32 mmol/L 22 22 25   Calcium 8.9 - 10.3 mg/dL 8.4(L) 8.5(L) 8.9   CBC Latest Ref Rng & Units 08/04/2017 02/22/2017 01/19/2017  WBC 4.0 - 10.5 K/uL 6.8 7.7 7.7  Hemoglobin 13.0 - 17.0 g/dL 15.2 14.7 15.1  Hematocrit 39.0 - 52.0 % 44.5 43.6 47.2  Platelets 150 - 400 K/uL 216 228 208   BNP = 310.6 Lactic Acid = 0.89  Echocardiography 08/05/2017 Study Conclusions - Left ventricle: The cavity size was normal. Wall thickness was   increased in a pattern of moderate LVH. The estimated ejection   fraction was 55%. Although no diagnostic regional wall motion   abnormality was identified, this possibility cannot be completely   excluded on the basis of this study. Doppler parameters are   consistent with abnormal left ventricular relaxation (grade 1   diastolic dysfunction). - Aortic valve: There was no stenosis. - Mitral valve: There was no significant regurgitation. -  Left atrium: The atrium was mildly dilated. - Right ventricle: The cavity size was normal. Systolic function   was normal. - Tricuspid valve: Peak RV-RA gradient (S): 20 mm Hg. - Pulmonary arteries: PA peak pressure: 23 mm Hg (S). - Inferior vena cava: The vessel was normal in size. The   respirophasic diameter changes were in the normal range (>= 50%),   consistent with normal central venous pressure.  Impressions: - Technically difficult study with poor acoustic windows. Normal LV   size with moderate LV hypertrophy. EF 55%. Normal RV size and   systolic function. No significant valvular abnormalities.  Chest Imaging, 2 View,  08/04/2017 FINDINGS: Cardiomegaly with vascular congestion. No effusions. No acute bony abnormality. Linear atelectasis in the left mid lung. No overt edema.  IMPRESSION: Cardiomegaly, vascular congestion. Subsegmental atelectasis in the left mid lung.  Chest Imaging, 2 View, 08/06/2017 FINDINGS: There is mild vascular congestion. Bilateral mid to lower lung field interstitial nodularity and hazy airspace density appear new or worsened compared to the prior radiograph and concerning for developing infiltrate. Clinical correlation is recommended. No lobar consolidation. There is no pleural effusion or pneumothorax. Stable borderline cardiomegaly. No acute osseous pathology.  IMPRESSION: Increased interstitial and airspace densities in the mid to lower lung fields concerning for developing infiltrate. Clinical correlation is recommended.  Discharge Instructions: Discharge Instructions    (HEART FAILURE PATIENTS) Call MD:  Anytime you have any of the following symptoms: 1) 3 pound weight gain in 24 hours or 5 pounds in 1 week 2) shortness of breath, with or without a dry hacking cough 3) swelling in the hands, feet or stomach 4) if you have to sleep on extra pillows at night in order to breathe.   Complete by:  As directed    AMB referral to CHF clinic    Complete by:  As directed    Call MD for:  persistant nausea and vomiting   Complete by:  As directed    Call MD for:  temperature >100.4   Complete by:  As directed    Diet - low sodium heart healthy   Complete by:  As directed    Discharge instructions   Complete by:  As directed    You were evaluated for cough, shortness of breath, lower extremity swelling, and weakness. Your shortness of breath and lower extremity swelling are a result of fluid accumulation in your body because of problems with your heart. Because your heart is a little weaker than it used to be, it has trouble pumping all of the fluid in your body. To help your heart handle all of this fluid, you will have to take a fluid pill (Lasix/Furosemide) every morning. This will help you urinate out the extra fluid your body has so your heart doesn't have to work as hard. Please take one 20 mg tablet of Lasix/Furosemide every day. You should weigh yourself every day. If you gain more than 3 lbs in 24 hours, your body is accumulating too much fluid and you need to call your primary care doctor for evaluation. You should also follow up with a cardiologist regarding your heart's functioning. A referral to the heart doctor was made on discharge. Please ask your primary care physician to help you schedule an appointment with a heart doctor. Keep this paper work so that you have this to reference when you see your primary care physician. Please set up an appointment with your regular doctor within 1-2 weeks of leaving the hospital.  As a result of too much fluid in your body and persistent leg swelling, you had a skin infection in one of your legs. You received oral antibiotics (Keflex, 500 mg to take 4 times a day). Please take this antibiotic for the next 3 days. While in the hospital, we found an infection in your lungs that may have occurred at the same time you started accumulating fluid in your lungs. Please take azithromycin for the next  five days to help with this infection. You will take two azithromycin pills on the first day, and then take 1 pill a day for the next 4 days. After this  you will be done taking antibiotics.   Because your heart is weaker than it used to be, it is very important that you take all diabetes and blood pressure medications regularly. You are to take metformin, glipizide, and lisinopril every day to help keep your heart strong. You should also take a daily aspirin and atorvastatin to help reduce your risk of stroke and heart disease. Please see the updated medication list on this paperwork to see what medications you must take every day. Please follow up with your primary care physician regarding these medications.   Increase activity slowly   Complete by:  As directed       Signed: Thomasene Ripple, MD 08/07/2017, 4:20 PM   Pager: [phone]

## 2017-08-07 NOTE — Progress Notes (Signed)
Subjective:  Patient seen sitting comfortably in bed in no acute distress. Patient states he feels his leg swelling has decreased. His cough has continued to persist even with diuresis. He states the cough is still productive of white fluid. No acute complaints.  Objective:  Vital signs in last 24 hours: Vitals:   08/06/17 1408 08/06/17 2247 08/07/17 0438 08/07/17 0527  BP: (!) 161/70 134/70  125/61  Pulse: 89 71  61  Resp: 20 20  18   Temp: (!) 101.3 F (38.5 C) 99.4 F (37.4 C)  99.3 F (37.4 C)  TempSrc: Oral Oral  Oral  SpO2: 97% 95%  95%  Weight:   255 lb 11.7 oz (116 kg)   Height:       Physical Exam  Constitutional: He appears well-developed and well-nourished. No distress.  HENT:  Mouth/Throat: Oropharynx is clear and moist.  Cardiovascular: Normal rate and regular rhythm. Exam reveals no friction rub.  No murmur heard. 2+ radial pulses bilaterally  Respiratory:  No accessory muscle use or nasal flaring. Minimal end expiratory wheezing and bibasilar crackles, consistent with previous exam.  Musculoskeletal: He exhibits no edema (1+ edema of bilateral lower extremities) or tenderness (1+ edema of bilateral lower extremities).  Skin: Skin is warm and dry.  Hyperpigmentation with chronic skin changes of bilateral lower extremities consistent with venous stasis. No significant difference in temperature between the two extremities.  Psychiatric: He has a normal mood and affect. His behavior is normal. Judgment normal.   Assessment/Plan:  Principal Problem:   Acute pulmonary edema (HCC) Active Problems:   Hyperlipidemia associated with type 2 diabetes mellitus (HCC)   Essential hypertension   Cocaine abuse ([place])   Diabetes mellitus type 2 with complications (HCC)   Edema of both legs   Cellulitis of left lower extremity   Acute diastolic congestive heart failure (HCC)  Yolanda Omeara is a 57 yo with PMH of HTN, DM, HLD, and tobacco, alcohol, and cocaine use who  presents for evaluation of bilateral lower extremity swelling, cough, and dyspnea on exertion. In the ED he was found to have vascular congestion on chest imaging and elevated BNP, suggestive of acute CHF exacerbation. He was admitted to the internal medicine teaching service for management. The specific problems addressed during admission were as follows:  Acute CHF exacerbation (echo 2019 with EF 55%, G1DD): New diagnosis for the patient. His echo yestereday was a technically difficult study but did reveal grade 1 diastolic dysfunction not observed in echo obtained in 09/2015. Patient -1.0L yesterday with IV lasix 20 mg BID. Will continue with oral diuresis and have outpatient follow up to assess his continued improvement on this medication. Patient continues to endorse cough, in spite of diuresis. Repeat chest imaging suggests possible developing infiltrate. Will add azithromycin to cover for atypical infection since patient developed fever yesterday while taking keflex for treatment of LE cellulitis. -Oral lasix 20 mg BID -Daily weights -Strict I & Os -Daily BMP while on lasix to assess for electrolytes, renal function  Nonpurulent LE cellulitis: Patient's exam without signs of erythema. Will continue treatment for LLE cellulitis. -Keflex, 500 mg QID for 5 days total  CAD: Noted on L/R heart cath in 2016. -Continue daily aspirin 81 mg  HTN: Maintained on lisinopril 40 mg. Currently normotensive since restarting this regimen. -Continue home lisinopril 40 mg daily -Hydralazine 5 mg for SBP >180 q4 hours PRN  DM: On metformin 1000 mg BID, glipizide 10 mg at home. Last A1C = 7.6%, suggesting  modest control at baseline.  -Continue glipizide 10 mg daily  HLD:  Continue home Atorvastatin 40 mg.  FEN/GI: -Heart Healthy/Carb Modified -No IVF, replace electrolytes as needed  VTE Prophylaxis: Lovenox daily Code Status: Full  Dispo: Anticipated discharge today.  Thomasene Ripple,  MD 08/07/2017, 9:02 AM Pager: [phone]

## 2017-08-07 NOTE — Progress Notes (Signed)
Patient was discharged home by MD order; discharged instructions  review and give to patient with care notes; IV DIC; patient will be escorted to the car by nurse via wheelchair.  

## 2017-08-07 NOTE — Progress Notes (Signed)
Physical Therapy Treatment Patient Details Name: [name] MRN: [id number] DOB: December 26, 1960 Today's Date: 08/07/2017    History of Present Illness 57 year old male with worsening dyspnea and LE edema (L>R). LLE [name] negative for DVT. Has elevated BNP today and CXR with cardiomegaly, vascular congestion, and sub-segmental atelectasis in the left midlung    PT Comments    Pt con't to have decreased step length, shuffled gait pattern. Emphasis on increasing step length and strengthening L LE this session. Acute PT to con't to follow.   Follow Up Recommendations  No PT follow up;Supervision - Intermittent(pt declined HHPT for L LE strengthening)     Equipment Recommendations  None recommended by PT    Recommendations for Other Services       Precautions / Restrictions Precautions Precautions: None Restrictions Weight Bearing Restrictions: No    Mobility  Bed Mobility               General bed mobility comments: pt up in chair upon PT arrival  Transfers Overall transfer level: Modified independent Equipment used: None             General transfer comment: pt push up from arms of chair and edge of bed, no difficulty, no physical assist needed  Ambulation/Gait Ambulation/Gait assistance: Supervision Ambulation Distance (Feet): 175 Feet(x2) Assistive device: Quad cane Gait Pattern/deviations: Shuffle;Decreased stride length;Step-to pattern Gait velocity: dec Gait velocity interpretation: Below normal speed for age/gender General Gait Details: gave pt max directional v/c's to increase step length and proper sequencing of cane. Pt 6'3" and not clearing L heal past R toes. Ambulated 2nd loop with RW with emphasis on increasing step length and fluidity of gait pattern. Pt unable to maintain. Pt with +SOB and coughing. Pt SpO2 >94% on RA. Pt with 3 standing rest breaks   Stairs            Wheelchair Mobility    Modified Rankin (Stroke Patients Only)        Balance           Standing balance support: During functional activity Standing balance-Leahy Scale: Fair Standing balance comment: using cane for support                            Cognition Arousal/Alertness: Awake/alert Behavior During Therapy: WFL for tasks assessed/performed Overall Cognitive Status: No family/caregiver present to determine baseline cognitive functioning                                        Exercises General Exercises - Lower Extremity Long Arc Quad: AROM;Left;Strengthening;20 reps;Seated(with 5 sec hold in extension) Hip Flexion/Marching: AROM;Left;10 reps;Seated    General Comments        Pertinent Vitals/Pain Pain Assessment: No/denies pain    Home Living                      Prior Function            PT Goals (current goals can now be found in the care plan section) Acute Rehab PT Goals Patient Stated Goal: go home today Progress towards PT goals: Progressing toward goals    Frequency    Min 3X/week      PT Plan Current plan remains appropriate    Co-evaluation  AM-PAC PT "6 Clicks" Daily Activity  Outcome Measure  Difficulty turning over in bed (including adjusting bedclothes, sheets and blankets)?: None Difficulty moving from lying on back to sitting on the side of the bed? : None Difficulty sitting down on and standing up from a chair with arms (e.g., wheelchair, bedside commode, etc,.)?: A Little Help needed moving to and from a bed to chair (including a wheelchair)?: A Little Help needed walking in hospital room?: A Little Help needed climbing 3-5 steps with a railing? : A Little 6 Click Score: 20    End of Session Equipment Utilized During Treatment: Gait belt Activity Tolerance: Patient limited by fatigue Patient left: in chair;with call bell/phone within reach Nurse Communication: Mobility status PT Visit Diagnosis: Unsteadiness on feet  (R26.81);Apraxia (R48.2)     Time: 2336-1224 PT Time Calculation (min) (ACUTE ONLY): 26 min  Charges:  $Gait Training: 8-22 mins $Therapeutic Activity: 8-22 mins                    G Codes:       Kittie Plater, PT, DPT Pager #: [phone] Office #: [phone]    [place] 08/07/2017, 10:29 AM

## 2017-08-07 NOTE — Care Management Note (Addendum)
Case Management Note  Patient Details  Name: [name] MRN: [id number] Date of Birth: 05/06/1961  Subjective/Objective:   Admitted with Acute pulmonary edema             Action/Plan: In to speak with patient, prior to admission patient lived at home alone.  Patient states he is active with [facility]: aide comes to his home daily.  He uses [employer].  Aide does the cooking and shopping.  Aide also takes patient to medical appointments. He limits salt in the foods.  Patient denies inability to afford medications or food.  Patient does not have a scale at home; RN did recommend that he purchase one. PCP Karle Plumber.  Home DME:  Cane, shower chair, walker-wheels.  Expected Discharge Date: 08/07/2017                 Expected Discharge Plan:  [place]  Discharge planning Services  CM Consult  Status of Service:  In process, will continue to follow  Kristen Cardinal, RN  Case Manager-5W rooms: [phone] 08/07/2017, 12:10 PM

## 2017-08-10 ENCOUNTER — Other Ambulatory Visit: Payer: Self-pay

## 2017-08-10 ENCOUNTER — Encounter: Payer: Self-pay | Admitting: Internal Medicine

## 2017-08-10 ENCOUNTER — Ambulatory Visit: Payer: Medicare Other | Attending: Internal Medicine | Admitting: Internal Medicine

## 2017-08-10 VITALS — BP 136/80 | HR 63 | Temp 98.1°F | Wt 266.8 lb

## 2017-08-10 DIAGNOSIS — E118 Type 2 diabetes mellitus with unspecified complications: Secondary | ICD-10-CM

## 2017-08-10 DIAGNOSIS — J189 Pneumonia, unspecified organism: Secondary | ICD-10-CM | POA: Diagnosis not present

## 2017-08-10 DIAGNOSIS — M48061 Spinal stenosis, lumbar region without neurogenic claudication: Secondary | ICD-10-CM | POA: Insufficient documentation

## 2017-08-10 DIAGNOSIS — Z6833 Body mass index (BMI) 33.0-33.9, adult: Secondary | ICD-10-CM | POA: Insufficient documentation

## 2017-08-10 DIAGNOSIS — Z8673 Personal history of transient ischemic attack (TIA), and cerebral infarction without residual deficits: Secondary | ICD-10-CM | POA: Diagnosis not present

## 2017-08-10 DIAGNOSIS — Z7982 Long term (current) use of aspirin: Secondary | ICD-10-CM | POA: Insufficient documentation

## 2017-08-10 DIAGNOSIS — I110 Hypertensive heart disease with heart failure: Secondary | ICD-10-CM | POA: Insufficient documentation

## 2017-08-10 DIAGNOSIS — E785 Hyperlipidemia, unspecified: Secondary | ICD-10-CM | POA: Insufficient documentation

## 2017-08-10 DIAGNOSIS — Z7984 Long term (current) use of oral hypoglycemic drugs: Secondary | ICD-10-CM | POA: Insufficient documentation

## 2017-08-10 DIAGNOSIS — F17210 Nicotine dependence, cigarettes, uncomplicated: Secondary | ICD-10-CM | POA: Diagnosis not present

## 2017-08-10 DIAGNOSIS — E669 Obesity, unspecified: Secondary | ICD-10-CM | POA: Insufficient documentation

## 2017-08-10 DIAGNOSIS — E113299 Type 2 diabetes mellitus with mild nonproliferative diabetic retinopathy without macular edema, unspecified eye: Secondary | ICD-10-CM | POA: Insufficient documentation

## 2017-08-10 DIAGNOSIS — H9193 Unspecified hearing loss, bilateral: Secondary | ICD-10-CM

## 2017-08-10 DIAGNOSIS — I5030 Unspecified diastolic (congestive) heart failure: Secondary | ICD-10-CM

## 2017-08-10 DIAGNOSIS — Z79899 Other long term (current) drug therapy: Secondary | ICD-10-CM | POA: Insufficient documentation

## 2017-08-10 DIAGNOSIS — I5032 Chronic diastolic (congestive) heart failure: Secondary | ICD-10-CM | POA: Insufficient documentation

## 2017-08-10 DIAGNOSIS — E1142 Type 2 diabetes mellitus with diabetic polyneuropathy: Secondary | ICD-10-CM | POA: Diagnosis not present

## 2017-08-10 LAB — GLUCOSE, POCT (MANUAL RESULT ENTRY): POC GLUCOSE: 162 mg/dL — AB (ref 70–99)

## 2017-08-10 NOTE — Progress Notes (Signed)
Patient ID: [name], male    DOB: Jan 15, 1961  MRN: [id number]  CC: Follow-up (BP and DM)   Subjective: [name] is a 57 y.o. male who presents for hosp f/u His concerns today include:  57 year old male with history of diabetes type 2, HTN, tobacco dependence, HL, CVA (about 3 yrs ago per pt), spinal stenosis of lumbar spine,chronic lower extremity edema Since last visit with me a week ago patient presented to the ER with lower extremity edema, increased shortness of breath and nonproductive cough.  Chest x-ray revealed cardiomegaly and pulmonary vascular congestion.  ProBNP was elevated.  Echo revealed diastolic dysfunction with preserved EF.  He was treated appropriately with diuretics with decrease in weight of 17 pounds.  Weight on discharge was 256 pounds. He was also found to have early community-acquired pneumonia and was placed on Zithromax.  Had cellulitis of the left foot for which he is completing a course of Keflex.  Since discharge his weight has increased by 10 pounds. -He reports compliance with Lasix. -Lower extremity edema has resolved.  Limit salt in the foods.  2.  Cough little better Clear phlegm No SOB No fever since discharge.  3.  C/o dec hearing BL despite having both ears flush on a previous visit Patient Active Problem List   Diagnosis Date Noted  . Cough   . Cellulitis of left lower extremity   . Acute diastolic congestive heart failure ([place])   . Acute pulmonary edema ([place]) 08/04/2017  . Immunization due 05/21/2017  . Edema of both legs 05/21/2017  . Mild nonproliferative diabetic retinopathy ([place]) 01/10/2016  . Hypertensive retinopathy of both eyes 01/10/2016  . Spinal stenosis of lumbar region 07/15/2015  . Abnormality of gait 06/07/2015  . Hydrocephalus 06/07/2015  . Peripheral neuropathy 04/26/2015  . Constipation 04/26/2015  . Diabetes mellitus type 2 with complications ([place]) 52/84/1324  . Decreased hearing of both ears 03/15/2015   . Tinea pedis 03/15/2015  . Callus of foot 05/22/2014  . 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 Coronary vasospasm, cocaine related 12/29/2013  . Cocaine abuse ([place]) 12/28/2013  . Alcoholism /alcohol abuse ([place]) 12/28/2013  . Left-sided weakness 12/26/2013  . Hyperlipidemia associated with type 2 diabetes mellitus ([place]) 04/01/2007  . OBESITY NOS 03/28/2007  . Essential hypertension 03/28/2007     Current Outpatient Medications on File Prior to Visit  Medication Sig Dispense Refill  . aspirin EC 81 MG tablet Take 1 tablet (81 mg total) by mouth daily. 100 tablet 0  . atorvastatin (LIPITOR) 40 MG tablet Take 1 tablet (40 mg total) by mouth daily. 90 tablet 3  . gabapentin (NEURONTIN) 300 MG capsule TAKE 1 CAPSULE (300 MG TOTAL) BY MOUTH THREE TIMES DAILY. 270 capsule 3  . glipiZIDE (GLIPIZIDE XL) 10 MG 24 hr tablet Take 1 tablet (10 mg total) by mouth daily with breakfast. 90 tablet 3  . lisinopril (PRINIVIL,ZESTRIL) 40 MG tablet Take 1 tablet (40 mg total) by mouth daily. 90 tablet 3  . metFORMIN (GLUCOPHAGE) 1000 MG tablet Take 1 tablet (1,000 mg total) by mouth 2 (two) times daily with a meal. 180 tablet 3  . azithromycin (ZITHROMAX Z-PAK) 250 MG tablet Take 2 tablets on day 1 (total of 500 mg). Then take 1 tablet for the next 4 days until all pills are gone. 6 each 0  . cephALEXin (KEFLEX) 500 MG capsule Take 1 capsule (500 mg total) by mouth every 6 (six) hours for 3 days. (  Patient not taking: Reported on 08/10/2017) 12 capsule 0  . furosemide (LASIX) 20 MG tablet Take 1 tablet (20 mg total) by mouth every morning. 30 tablet 6  . glucose blood (TRUE METRIX BLOOD GLUCOSE TEST) test strip Use as instructed 100 each 12  . IBU 800 MG tablet Take 800 mg by mouth as needed for pain.    . Lancets (ONETOUCH ULTRASOFT) lancets Use as instructed 100 each 12  . mupirocin ointment (BACTROBAN) 2 % Apply to affected area BID 22 g 0  . TRUEPLUS LANCETS 28G MISC Use as directed 100 each 6    No current facility-administered medications on file prior to visit.     Allergies  Allergen Reactions  . Amlodipine Swelling    Lower extremity swelling    Social History   Socioeconomic History  . Marital status: Divorced    Spouse name: Not on file  . Number of children: 0  . Years of education: 10  . Highest education level: Not on file  Social Needs  . Financial resource strain: Not on file  . Food insecurity - worry: Not on file  . Food insecurity - inability: Not on file  . Transportation needs - medical: Not on file  . Transportation needs - non-medical: Not on file  Occupational History  . Occupation: Unemployed  Tobacco Use  . Smoking status: Current Every Day Smoker    Packs/day: 0.25    Types: Cigarettes  . Smokeless tobacco: Never Used  Substance and Sexual Activity  . Alcohol use: Yes    Alcohol/week: 1.2 - 1.8 oz    Types: 2 - 3 Cans of beer per week    Comment: occassional  . Drug use: Yes    Types: "Crack" cocaine, Cocaine    Comment: once a month  . Sexual activity: Not on file  Other Topics Concern  . Not on file  Social History Narrative   Lives at home alone.   Right-handed.   Occasional use.    Family History  Problem Relation Age of Onset  . Hypertension Mother   . Diabetes Mother   . Cancer Mother        breast cancer   . Hypertension Father   . Heart disease Father   . Stroke Father   . Hypertension Sister     Past Surgical History:  Procedure Laterality Date  . CARDIAC CATHETERIZATION  12/27/2013  . CARDIAC CATHETERIZATION  12/27/2013   Impression: 1. 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 Moderate disease in the small caliber intermediate branch, 3. Normal LV systolic function  . left arm skin graft    . LEFT HEART CATHETERIZATION WITH CORONARY ANGIOGRAM Bilateral 12/26/2013   Procedure: LEFT HEART CATHETERIZATION WITH CORONARY ANGIOGRAM;  Surgeon: Burnell Blanks, MD;  Location: [facility] CATH LAB;  Service: Cardiovascular;   Laterality: Bilateral;    ROS: Review of Systems Negative except as stated above. PHYSICAL EXAM: BP 136/80 (BP Location: Left Arm, Patient Position: Sitting, Cuff Size: Large)   Pulse 63   Temp 98.1 F (36.7 C) (Oral)   Wt 266 lb 12.8 oz (121 kg)   SpO2 92%   BMI 33.35 kg/m   Repeat Pox 94-95% RA Physical Exam  General appearance - alert, well appearing, and in no distress Mental status - alert, oriented to person, place, and time, normal mood, behavior, speech, dress, motor activity, and thought processes Chest - some popping heard both bases Heart - RRR. No JVD Extremities - trace edema LT  foot  Results for orders placed or performed in visit on 08/10/17  POCT glucose (manual entry)  Result Value Ref Range   POC Glucose 162 (A) 70 - 99 mg/dl    ASSESSMENT AND PLAN: 1. Diastolic congestive heart failure, unspecified HF chronicity (HCC) Stress importance of low salt intact Went of symptoms that would suggest decompensation eg LE edema, SOB and PND.  Advise to increase dose of Furosemide to 40 mg daily for 3 days should these symptoms occur or f/u with me immediately - AMB referral to CHF clinic - Basic Metabolic Panel  2. Type 2 diabetes mellitus with complication, without long-term current use of insulin (HCC) - POCT glucose (manual entry)  3. Community acquired pneumonia, unspecified laterality Pt haas 2 more days of Zithromax  4. Decreased hearing of both ears - Ambulatory referral to ENT  Patient was given the opportunity to ask questions.  Patient verbalized understanding of the plan and was able to repeat key elements of the plan.   Orders Placed This Encounter  Procedures  . Basic Metabolic Panel  . AMB referral to CHF clinic  . Ambulatory referral to ENT  . POCT glucose (manual entry)     Requested Prescriptions    No prescriptions requested or ordered in this encounter    Return in about 8 weeks (around 10/05/2017).  Karle Plumber, MD, FACP

## 2017-08-10 NOTE — Patient Instructions (Addendum)
Please complete the antibiotics.   If you develop swelling in the legs or increase shortness of breath, increase your fluid pill to two tablets a day for 3 days or follow up with me.  Continue to limit salt in the foods.

## 2017-08-11 LAB — BASIC METABOLIC PANEL
BUN / CREAT RATIO: 27 — AB (ref 9–20)
BUN: 29 mg/dL — AB (ref 6–24)
CHLORIDE: 105 mmol/L (ref 96–106)
CO2: 23 mmol/L (ref 20–29)
Calcium: 9.4 mg/dL (ref 8.7–10.2)
Creatinine, Ser: 1.08 mg/dL (ref 0.76–1.27)
GFR calc non Af Amer: 75 mL/min/{1.73_m2} (ref >59)
GFR, EST AFRICAN AMERICAN: 87 mL/min/{1.73_m2} (ref >59)
Glucose: 120 mg/dL — ABNORMAL HIGH (ref 65–99)
Potassium: 4.9 mmol/L (ref 3.5–5.2)
Sodium: 143 mmol/L (ref 134–144)

## 2017-09-11 ENCOUNTER — Ambulatory Visit (INDEPENDENT_AMBULATORY_CARE_PROVIDER_SITE_OTHER): Payer: Medicare Other | Admitting: Podiatry

## 2017-09-11 ENCOUNTER — Encounter: Payer: Self-pay | Admitting: Podiatry

## 2017-09-11 DIAGNOSIS — M79675 Pain in left toe(s): Secondary | ICD-10-CM

## 2017-09-11 DIAGNOSIS — M79674 Pain in right toe(s): Secondary | ICD-10-CM | POA: Diagnosis not present

## 2017-09-11 DIAGNOSIS — R21 Rash and other nonspecific skin eruption: Secondary | ICD-10-CM

## 2017-09-11 DIAGNOSIS — B351 Tinea unguium: Secondary | ICD-10-CM | POA: Diagnosis not present

## 2017-09-11 DIAGNOSIS — E1142 Type 2 diabetes mellitus with diabetic polyneuropathy: Secondary | ICD-10-CM

## 2017-09-11 MED ORDER — CLOTRIMAZOLE-BETAMETHASONE 1-0.05 % EX CREA: 1.0000 "application " | TOPICAL_CREAM | Freq: Two times a day (BID) | CUTANEOUS | 0 refills | Status: DC

## 2017-09-11 NOTE — Patient Instructions (Signed)
If the rash is not better in the next 2-3 weeks with the medication I sent to your pharmacy, please give [name] a call at [phone].  Monitor for any signs/symptoms of infection. Call the office immediately if any occur or go directly to the emergency room. Call with any questions/concerns.

## 2017-09-12 NOTE — Progress Notes (Signed)
Subjective: 57 y.o. returns the office today for painful, elongated, thickened toenails which he cannot trim himself. Denies any redness or drainage around the nails. Denies any acute changes since last appointment and no new complaints today. Denies any systemic complaints such as fevers, chills, nausea, vomiting.   PCP: Ladell Pier, MD  Objective: AAO 3, NAD DP/PT pulses decreased, chronic bilateral lower extremity edema Protective sensation decreased with Derrel Nip monofilament Nails hypertrophic, dystrophic, elongated, brittle, discolored 10. There is tenderness overlying the nails 1-5 bilaterally. There is no surrounding erythema or drainage along the nail sites. To the dorsal aspect of the left foot on the first interspace there is an area of dry, erythematous skin appears to be tinea pedis skin reaction.  There is no open sore drainage or any blistering. No open lesions or pre-ulcerative lesions are identified. No other areas of tenderness bilateral lower extremities. No overlying edema, erythema, increased warmth. No pain with calf compression, swelling, warmth, erythema.      Assessment: Patient presents with symptomatic onychomycosis; skin rash likely tinea  Plan: -Treatment options including alternatives, risks, complications were discussed -Nails sharply debrided 10 without complication/bleeding. -Prescribed Lotrisone cream.  The area does not resolve the next 1-2 weeks to call the office or sooner if any issues are to arise or any worsening. -Discussed daily foot inspection. If there are any changes, to call the office immediately.  -Follow-up in 3 months or sooner if any problems are to arise. In the meantime, encouraged to call the office with any questions, concerns, changes symptoms.  Celesta Gentile, DPM

## 2017-09-18 ENCOUNTER — Encounter (INDEPENDENT_AMBULATORY_CARE_PROVIDER_SITE_OTHER): Payer: Medicare Other | Admitting: Ophthalmology

## 2017-09-18 DIAGNOSIS — I10 Essential (primary) hypertension: Secondary | ICD-10-CM

## 2017-09-18 DIAGNOSIS — E113312 Type 2 diabetes mellitus with moderate nonproliferative diabetic retinopathy with macular edema, left eye: Secondary | ICD-10-CM | POA: Diagnosis not present

## 2017-09-18 DIAGNOSIS — E113391 Type 2 diabetes mellitus with moderate nonproliferative diabetic retinopathy without macular edema, right eye: Secondary | ICD-10-CM | POA: Diagnosis not present

## 2017-09-18 DIAGNOSIS — H35033 Hypertensive retinopathy, bilateral: Secondary | ICD-10-CM | POA: Diagnosis not present

## 2017-09-18 DIAGNOSIS — H2513 Age-related nuclear cataract, bilateral: Secondary | ICD-10-CM | POA: Diagnosis not present

## 2017-09-18 DIAGNOSIS — E11311 Type 2 diabetes mellitus with unspecified diabetic retinopathy with macular edema: Secondary | ICD-10-CM | POA: Diagnosis not present

## 2017-09-18 DIAGNOSIS — H43813 Vitreous degeneration, bilateral: Secondary | ICD-10-CM | POA: Diagnosis not present

## 2017-09-18 LAB — HM DIABETES EYE EXAM

## 2017-09-19 ENCOUNTER — Telehealth: Payer: Self-pay | Admitting: Internal Medicine

## 2017-09-19 NOTE — Telephone Encounter (Signed)
1 page, paperwork received though fax 09-19-17.

## 2017-09-26 ENCOUNTER — Other Ambulatory Visit: Payer: Self-pay

## 2017-09-26 ENCOUNTER — Encounter: Payer: Self-pay | Admitting: Physician Assistant

## 2017-09-26 ENCOUNTER — Ambulatory Visit: Payer: Medicare Other | Attending: Internal Medicine | Admitting: Physician Assistant

## 2017-09-26 VITALS — BP 177/91 | HR 73 | Temp 98.0°F | Resp 16 | Wt 276.0 lb

## 2017-09-26 DIAGNOSIS — L42 Pityriasis rosea: Secondary | ICD-10-CM | POA: Diagnosis not present

## 2017-09-26 DIAGNOSIS — B356 Tinea cruris: Secondary | ICD-10-CM | POA: Diagnosis not present

## 2017-09-26 DIAGNOSIS — L739 Follicular disorder, unspecified: Secondary | ICD-10-CM

## 2017-09-26 DIAGNOSIS — Z79899 Other long term (current) drug therapy: Secondary | ICD-10-CM | POA: Diagnosis not present

## 2017-09-26 DIAGNOSIS — I10 Essential (primary) hypertension: Secondary | ICD-10-CM | POA: Diagnosis not present

## 2017-09-26 DIAGNOSIS — Z7982 Long term (current) use of aspirin: Secondary | ICD-10-CM | POA: Insufficient documentation

## 2017-09-26 DIAGNOSIS — E118 Type 2 diabetes mellitus with unspecified complications: Secondary | ICD-10-CM | POA: Insufficient documentation

## 2017-09-26 DIAGNOSIS — Z7984 Long term (current) use of oral hypoglycemic drugs: Secondary | ICD-10-CM | POA: Insufficient documentation

## 2017-09-26 DIAGNOSIS — Z888 Allergy status to other drugs, medicaments and biological substances status: Secondary | ICD-10-CM | POA: Diagnosis not present

## 2017-09-26 DIAGNOSIS — R21 Rash and other nonspecific skin eruption: Secondary | ICD-10-CM | POA: Diagnosis present

## 2017-09-26 LAB — GLUCOSE, POCT (MANUAL RESULT ENTRY): POC GLUCOSE: 77 mg/dL (ref 70–99)

## 2017-09-26 MED ORDER — CETIRIZINE HCL 10 MG PO TABS: 10.0000 mg | ORAL_TABLET | Freq: Every day | ORAL | 11 refills | Status: DC

## 2017-09-26 MED ORDER — DOXYCYCLINE HYCLATE 100 MG PO TABS: 100.0000 mg | ORAL_TABLET | Freq: Two times a day (BID) | ORAL | 0 refills | Status: DC

## 2017-09-26 MED ORDER — CLOTRIMAZOLE-BETAMETHASONE 1-0.05 % EX CREA: 1.0000 "application " | TOPICAL_CREAM | Freq: Two times a day (BID) | CUTANEOUS | 1 refills | Status: DC

## 2017-09-26 NOTE — Patient Instructions (Signed)
Folliculitis Folliculitis is inflammation of the hair follicles. Folliculitis most commonly occurs on the scalp, thighs, legs, back, and buttocks. However, it can occur anywhere on the body. What are the causes? This condition may be caused by:  A bacterial infection (common).  A fungal infection.  A viral infection.  Coming into contact with certain chemicals, especially oils and tars.  Shaving or waxing.  Applying greasy ointments or creams to your skin often.  Long-lasting folliculitis and folliculitis that keeps coming back can be caused by bacteria that live in the nostrils. What increases the risk? This condition is more likely to develop in people with:  A weakened immune system.  Diabetes.  Obesity.  What are the signs or symptoms? Symptoms of this condition include:  Redness.  Soreness.  Swelling.  Itching.  Small white or yellow, pus-filled, itchy spots (pustules) that appear over a reddened area. If there is an infection that goes deep into the follicle, these may develop into a boil (furuncle).  A group of closely packed boils (carbuncle). These tend to form in hairy, sweaty areas of the body.  How is this diagnosed? This condition is diagnosed with a skin exam. To find what is causing the condition, your health care provider may take a sample of one of the pustules or boils for testing. How is this treated? This condition may be treated by:  Applying warm compresses to the affected areas.  Taking an antibiotic medicine or applying an antibiotic medicine to the skin.  Applying or bathing with an antiseptic solution.  Taking an over-the-counter medicine to help with itching.  Having a procedure to drain any pustules or boils. This may be done if a pustule or boil contains a lot of pus or fluid.  Laser hair removal. This may be done to treat long-lasting folliculitis.  Follow these instructions at home:  If directed, apply heat to the affected  area as often as told by your health care provider. Use the heat source that your health care provider recommends, such as a moist heat pack or a heating pad. ? Place a towel between your skin and the heat source. ? Leave the heat on for 20-30 minutes. ? Remove the heat if your skin turns bright red. This is especially important if you are unable to feel pain, heat, or cold. You may have a greater risk of getting burned.  If you were prescribed an antibiotic medicine, use it as told by your health care provider. Do not stop using the antibiotic even if you start to feel better.  Take over-the-counter and prescription medicines only as told by your health care provider.  Do not shave irritated skin.  Keep all follow-up visits as told by your health care provider. This is important. Get help right away if:  You have more redness, swelling, or pain in the affected area.  Red streaks are spreading from the affected area.  You have a fever. This information is not intended to replace advice given to you by your health care provider. Make sure you discuss any questions you have with your health care provider. Document Released: 09/18/2001 Document Revised: 01/28/2016 Document Reviewed: 04/30/2015 Elsevier Interactive Patient Education  2018 [place] Itch Jock itch is an infection of the skin in the groin area. It is caused by a type of germ (fungus). The infection causes a rash and itching in the groin and upper thigh. It is common in people who play sports. Being in places with  hot weather and wearing tight or wet clothes can increase the chance of getting jock itch. The rash usually goes away in 2-3 weeks with treatment. Follow these instructions at home:  Take medicines only as told by your doctor. Apply skin creams or ointments exactly as told.  Wear loose-fitting clothing. ? Men should wear cotton boxer shorts. ? Women should wear cotton underwear.  Change your underwear  every day to keep your groin dry.  Avoid hot baths.  Dry your groin area well after you take a bath or shower. ? Use a separate towel to dry your groin area. This will help to prevent a spreading of the infection to other areas of your body.  Do not scratch the affected area.  Do not share towels with other people. Contact a doctor if:  Your rash does not improve or it gets worse after 2 weeks of treatment.  Your rash is spreading.  Your rash comes back after treatment is finished.  You have a fever.  You have redness, swelling, or pain in the area around your rash.  You have fluid, blood, or pus coming from your rash.  Your have your rash for more than 4 weeks. This information is not intended to replace advice given to you by your health care provider. Make sure you discuss any questions you have with your health care provider. Document Released: 10/04/2009 Document Revised: 12/16/2015 Document Reviewed: 04/21/2014 Elsevier Interactive Patient Education  2018 [employer]. Pityriasis Rosea Pityriasis rosea is a rash that usually appears on the trunk of the body. It may also appear on the upper arms and upper legs. It usually begins as a single patch, and then more patches begin to develop. The rash may cause mild itching, but it normally does not cause other problems. It usually goes away without treatment. However, it may take weeks or months for the rash to go away completely. What are the causes? The cause of this condition is not known. The condition does not spread from person to person (is noncontagious). What increases the risk? This condition is more likely to develop in young adults and children. It is most common in the spring and fall. What are the signs or symptoms? The main symptom of this condition is a rash.  The rash usually begins with a single oval patch that is larger than the ones that follow. This is called a herald patch. It generally appears a week or  more before the rest of the rash appears.  When more patches start to develop, they spread quickly on the trunk, back, and arms. These patches are smaller than the first one.  The patches that make up the rash are usually oval-shaped and pink or red in color. They are usually flat, but they may sometimes be raised so that they can be felt with a finger. They may also be finely crinkled and have a scaly ring around the edge.  The rash does not typically appear on areas of the skin that are exposed to the sun.  Most people who have this condition do not have other symptoms, but some have mild itching. In a few cases, a mild headache or body aches may occur before the rash appears and then go away. How is this diagnosed? Your health care provider may diagnose this condition by doing a physical exam and taking your medical history. To rule out other possible causes for the rash, the health care provider may order blood tests or take  a skin sample from the rash to be looked at under a microscope. How is this treated? Usually, treatment is not needed for this condition. The rash will probably go away on its own in 4-8 weeks. In some cases, a health care provider may recommend or prescribe medicine to reduce itching. Follow these instructions at home:  Take medicines only as directed by your health care provider.  Avoid scratching the affected areas of skin.  Do not take hot baths or use a sauna. Use only warm water when bathing or showering. Heat can increase itching. Contact a health care provider if:  Your rash does not go away in 8 weeks.  Your rash gets much worse.  You have a fever.  You have swelling or pain in the rash area.  You have fluid, blood, or pus coming from the rash area. This information is not intended to replace advice given to you by your health care provider. Make sure you discuss any questions you have with your health care provider. Document Released: 08/16/2001  Document Revised: 12/16/2015 Document Reviewed: 06/17/2014 Elsevier Interactive Patient Education  [employer].

## 2017-09-26 NOTE — Progress Notes (Signed)
Patient ID: [name], male   DOB: March 16, 1961, 56 y.o.   MRN: [id number]   [name], is a 57 y.o. male  TDV:[id number]  GGY:[id number]  DOB - Jul 11, 1961  Subjective:  Chief Complaint and HPI: [name] is a 57 y.o. male here today for rash on arms and legs.  +itching.  No f/c.  No s/sx of systemic illness.  It has been there for a week or two.  The first spot was on the back of his R thigh then he broke out on his arms and behind his L knee.    2 hair bumps in his groin that are tender and one was bleeding.  Also itching rash in groin.  Blood sugars have been good.  No f/c  ROS:   Constitutional:  No f/c, No night sweats, No unexplained weight loss. EENT:  No vision changes, No blurry vision, No hearing changes. No mouth, throat, or ear problems.  Respiratory: No cough, No SOB Cardiac: No CP, no palpitations GI:  No abd pain, No N/V/D. GU: No Urinary s/sx Musculoskeletal: No joint pain Neuro: No headache, no dizziness, no motor weakness.  Skin: + rash Endocrine:  No polydipsia. No polyuria.  Psych: Denies SI/HI  No problems updated.  ALLERGIES: Allergies  Allergen Reactions  . Amlodipine Swelling    Lower extremity swelling    PAST MEDICAL HISTORY: Past Medical History:  Diagnosis Date  . Alcohol abuse   . Chronic arm pain   . Chronic leg pain   . Chronic pain   . Cocaine abuse ([place])   . Diabetes mellitus   . Gait instability   . Hypertension   . 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 Peripheral edema    chronic LUE  . Tobacco abuse     MEDICATIONS AT HOME: Prior to Admission medications   Medication Sig Start Date End Date Taking? Authorizing Provider  aspirin EC 81 MG tablet Take 1 tablet (81 mg total) by mouth daily. 03/13/17  Yes Ladell Pier, MD  atorvastatin (LIPITOR) 40 MG tablet Take 1 tablet (40 mg total) by mouth daily. 12/28/16  Yes Funches, Josalyn, MD  furosemide (LASIX) 20 MG tablet Take 1 tablet  (20 mg total) by mouth every morning. 08/07/17  Yes Nedrud, Larena Glassman, MD  gabapentin (NEURONTIN) 300 MG capsule TAKE 1 CAPSULE (300 MG TOTAL) BY MOUTH THREE TIMES DAILY. 02/22/17  Yes Funches, Josalyn, MD  glipiZIDE (GLIPIZIDE XL) 10 MG 24 hr tablet Take 1 tablet (10 mg total) by mouth daily with breakfast. 12/28/16  Yes Funches, Josalyn, MD  glucose blood (TRUE METRIX BLOOD GLUCOSE TEST) test strip Use as instructed 08/03/17  Yes Ladell Pier, MD  IBU 800 MG tablet Take 800 mg by mouth as needed for pain. 06/27/17  Yes [provider]  Lancets Glory Rosebush ULTRASOFT) lancets Use as instructed 08/03/17  Yes Ladell Pier, MD  lisinopril (PRINIVIL,ZESTRIL) 40 MG tablet Take 1 tablet (40 mg total) by mouth daily. 08/03/17  Yes Ladell Pier, MD  metFORMIN (GLUCOPHAGE) 1000 MG tablet Take 1 tablet (1,000 mg total) by mouth 2 (two) times daily with a meal. 12/28/16  Yes Funches, Josalyn, MD  azithromycin (ZITHROMAX Z-PAK) 250 MG tablet Take 2 tablets on day 1 (total of 500 mg). Then take 1 tablet for the next 4 days until all pills are gone. Patient not taking: Reported on 09/26/2017 08/07/17   Thomasene Ripple, MD  cetirizine (ZYRTEC) 10 MG  tablet Take 1 tablet (10 mg total) by mouth daily. 09/26/17   Argentina Donovan, PA-C  clotrimazole-betamethasone (LOTRISONE) cream Apply 1 application topically 2 (two) times daily. To the groin X 3 weeks 09/26/17   Argentina Donovan, PA-C  doxycycline (VIBRA-TABS) 100 MG tablet Take 1 tablet (100 mg total) by mouth 2 (two) times daily. 09/26/17   Argentina Donovan, PA-C  mupirocin ointment (BACTROBAN) 2 % Apply to affected area BID Patient not taking: Reported on 09/26/2017 08/03/17   Ladell Pier, MD  TRUEPLUS LANCETS 28G MISC Use as directed 08/03/17   Ladell Pier, MD     Objective:  EXAM:   Vitals:   09/26/17 1353  BP: (!) 177/91  Pulse: 73  Resp: 16  Temp: 98 F (36.7 C)  TempSrc: Oral  SpO2: 98%  Weight: 276 lb (125.2 kg)     General appearance : A&OX3. NAD. Non-toxic-appearing HEENT: Atraumatic and Normocephalic.  PERRLA. EOM intact.  Neck: supple, no JVD. No cervical lymphadenopathy. No thyromegaly Chest/Lungs:  Breathing-non-labored, Good air entry bilaterally, breath sounds normal without rales, rhonchi, or wheezing  CVS: S1 S2 regular, no murmurs, gallops, rubs  Extremities: Bilateral Lower Ext shows no edema, both legs are warm to touch with = pulse throughout Groin-ingrown hairs in the inguinal region X2 that are TTP.  Not indurated and no fluctuance.  There is beefy erythema in the inguinal creases with satellite lesions Neurology:  CN II-XII grossly intact, Non focal.   Psych:  TP linear. J/I WNL. Normal speech. Appropriate eye contact and affect.  Skin:  "herald patch on R posterior thigh with several other oval shaped well demarcated lesions on his B arms and behind his L knee.  Data Review Lab Results  Component Value Date   HGBA1C 7.6 08/03/2017   HGBA1C 6.8 03/13/2017   HGBA1C 6.8 02/22/2017     Assessment & Plan   1. Folliculitis in groin - doxycycline (VIBRA-TABS) 100 MG tablet; Take 1 tablet (100 mg total) by mouth 2 (two) times daily.  Dispense: 20 tablet; Refill: 0  2. Tinea cruris - clotrimazole-betamethasone (LOTRISONE) cream; Apply 1 application topically 2 (two) times daily. To the groin X 3 weeks  Dispense: 30 g; Refill: 1  3. Pityriasis rosea - cetirizine (ZYRTEC) 10 MG tablet; Take 1 tablet (10 mg total) by mouth daily.  Dispense: 30 tablet; Refill: 11  4. Type 2 diabetes mellitus with complication, without long-term current use of insulin (HCC) Controlled-continue current regimen.   - Glucose (CBG)   Patient have been counseled extensively about nutrition and exercise  Return for keep 10/08/2017 appt with Dr Wynetta Emery.  The patient was given clear instructions to go to ER or return to medical center if symptoms don't improve, worsen or new problems develop. The patient  verbalized understanding. The patient was told to call to get lab results if they haven't heard anything in the next week.     Freeman Caldron, PA-C [facility] and [facility], [place]   09/26/2017, 2:04 PM

## 2017-09-26 NOTE — Progress Notes (Signed)
C/o rash on arms Bleeding from 2  Nodules around his groin

## 2017-09-30 ENCOUNTER — Encounter (HOSPITAL_COMMUNITY): Payer: Self-pay | Admitting: Emergency Medicine

## 2017-09-30 ENCOUNTER — Emergency Department (HOSPITAL_COMMUNITY)
Admission: EM | Admit: 2017-09-30 | Discharge: 2017-09-30 | Disposition: A | Discharge status: Home / Self Care | Payer: Medicare Other | Attending: Emergency Medicine | Admitting: Emergency Medicine

## 2017-09-30 DIAGNOSIS — I10 Essential (primary) hypertension: Secondary | ICD-10-CM | POA: Diagnosis not present

## 2017-09-30 DIAGNOSIS — Z79899 Other long term (current) drug therapy: Secondary | ICD-10-CM | POA: Diagnosis not present

## 2017-09-30 DIAGNOSIS — E119 Type 2 diabetes mellitus without complications: Secondary | ICD-10-CM | POA: Diagnosis not present

## 2017-09-30 DIAGNOSIS — F17210 Nicotine dependence, cigarettes, uncomplicated: Secondary | ICD-10-CM | POA: Diagnosis not present

## 2017-09-30 DIAGNOSIS — Z7984 Long term (current) use of oral hypoglycemic drugs: Secondary | ICD-10-CM | POA: Insufficient documentation

## 2017-09-30 DIAGNOSIS — Z7982 Long term (current) use of aspirin: Secondary | ICD-10-CM | POA: Insufficient documentation

## 2017-09-30 DIAGNOSIS — R319 Hematuria, unspecified: Secondary | ICD-10-CM | POA: Diagnosis not present

## 2017-09-30 DIAGNOSIS — Z87448 Personal history of other diseases of urinary system: Secondary | ICD-10-CM | POA: Insufficient documentation

## 2017-09-30 DIAGNOSIS — B356 Tinea cruris: Secondary | ICD-10-CM | POA: Diagnosis not present

## 2017-09-30 DIAGNOSIS — R21 Rash and other nonspecific skin eruption: Secondary | ICD-10-CM | POA: Diagnosis not present

## 2017-09-30 LAB — CBC
HCT: 42.1 % (ref 39.0–52.0)
Hemoglobin: 13.7 g/dL (ref 13.0–17.0)
MCH: 27.7 pg (ref 26.0–34.0)
MCHC: 32.5 g/dL (ref 30.0–36.0)
MCV: 85.2 fL (ref 78.0–100.0)
Platelets: 218 10*3/uL (ref 150–400)
RBC: 4.94 MIL/uL (ref 4.22–5.81)
RDW: 14.3 % (ref 11.5–15.5)
WBC: 9 10*3/uL (ref 4.0–10.5)

## 2017-09-30 LAB — URINALYSIS, ROUTINE W REFLEX MICROSCOPIC
Bacteria, UA: NONE SEEN
Bilirubin Urine: NEGATIVE
Glucose, UA: NEGATIVE mg/dL
Hgb urine dipstick: NEGATIVE
Ketones, ur: 5 mg/dL — AB
Leukocytes, UA: NEGATIVE
Nitrite: NEGATIVE
Protein, ur: 30 mg/dL — AB
Specific Gravity, Urine: 1.023 (ref 1.005–1.030)
pH: 5 (ref 5.0–8.0)

## 2017-09-30 LAB — BASIC METABOLIC PANEL
ANION GAP: 9 (ref 5–15)
BUN: 22 mg/dL — ABNORMAL HIGH (ref 6–20)
CHLORIDE: 107 mmol/L (ref 101–111)
CO2: 25 mmol/L (ref 22–32)
Calcium: 9.2 mg/dL (ref 8.9–10.3)
Creatinine, Ser: 1.51 mg/dL — ABNORMAL HIGH (ref 0.61–1.24)
GFR calc non Af Amer: 49 mL/min — ABNORMAL LOW (ref >60)
GFR, EST AFRICAN AMERICAN: 57 mL/min — AB (ref >60)
Glucose, Bld: 85 mg/dL (ref 65–99)
POTASSIUM: 4.4 mmol/L (ref 3.5–5.1)
SODIUM: 141 mmol/L (ref 135–145)

## 2017-09-30 MED ORDER — HYDROXYZINE HCL 25 MG PO TABS: 25.0000 mg | ORAL_TABLET | Freq: Four times a day (QID) | ORAL | 0 refills | Status: DC

## 2017-09-30 MED ORDER — CLOTRIMAZOLE 1 % EX CREA: TOPICAL_CREAM | CUTANEOUS | 0 refills | Status: DC

## 2017-09-30 NOTE — ED Provider Notes (Signed)
[facility] EMERGENCY DEPARTMENT Provider Note   CSN: [id number] Arrival date & time: 09/30/17  1525     History   Chief Complaint Chief Complaint  Patient presents with  . Hematuria  . boils to groin    HPI [name] is a 57 y.o. male.  HPI  [name] is a 57 year old male with a history of polysubstance abuse, type 2 diabetes, hypertension and tobacco use who presents to the emergency department with multiple complaints.  Patient states that he has noticed painless red blood in his urine for the past week now.  It seems to happen intermittently, not with every void.  States that the entire urinary stream is red when it does occur.  Denies clots. Denies straining to urinate.  States that he has never had this in the past.  He denies fever/chills, dysuria, urinary frequency, flank pain, abdominal pain, nausea/vomiting.  He denies blood thinner use or bleeding elsewhere. Patient also states that he has had a few itchy dry spots on his arms and leg for the past month and a half now.  Spots are dark in color.  Denies ever having this in the past.  States that his primary doctor gave him Zyrtec and doxycycline which has not seem to help him.  He denies recent travel outside the country or being outside in the woods.  He denies new soaps, lotions or detergents.  He denies fevers, chills, pustules or vesicles, drainage, arthralgias, open wounds, difficulty breathing or swallowing. Finally patient states he has left groin rash which is itchy. Denies pain in this area. Denies tenderness to the touch.   Past Medical History:  Diagnosis Date  . Alcohol abuse   . Chronic arm pain   . Chronic leg pain   . Chronic pain   . Cocaine abuse ([place])   . Diabetes mellitus   . Gait instability   . Hypertension   . 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 Peripheral edema    chronic LUE  . Tobacco abuse     Patient Active Problem List   Diagnosis Date Noted  . Cough   . Cellulitis of left lower extremity   . Acute diastolic congestive heart failure ([place])   . Acute pulmonary edema ([place]) 08/04/2017  . Immunization due 05/21/2017  . Edema of both legs 05/21/2017  . 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 Hypertensive retinopathy of both eyes 01/10/2016  . Spinal stenosis of lumbar region 07/15/2015  . Abnormality of gait 06/07/2015  . Hydrocephalus 06/07/2015  . Peripheral neuropathy 04/26/2015  . Constipation 04/26/2015  . Diabetes mellitus type 2 with complications ([place]) 02/77/4128  . Decreased hearing of both ears 03/15/2015  . Tinea pedis 03/15/2015  . Callus of foot 05/22/2014  . 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 Coronary vasospasm, cocaine related 12/29/2013  . Cocaine abuse ([place]) 12/28/2013  . Alcoholism /alcohol abuse ([place]) 12/28/2013  . Left-sided weakness 12/26/2013  . Hyperlipidemia associated with type 2 diabetes mellitus ([place]) 04/01/2007  . OBESITY NOS 03/28/2007  . Essential hypertension 03/28/2007    Past Surgical History:  Procedure Laterality Date  . CARDIAC CATHETERIZATION  12/27/2013  . CARDIAC CATHETERIZATION  12/27/2013   Impression: 1. 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 Moderate disease in the small caliber intermediate branch, 3. Normal LV systolic function  . left arm skin graft    . LEFT HEART CATHETERIZATION WITH CORONARY ANGIOGRAM Bilateral 12/26/2013  Procedure: LEFT HEART CATHETERIZATION WITH CORONARY ANGIOGRAM;  Surgeon: Burnell Blanks, MD;  Location: [facility] CATH LAB;  Service: Cardiovascular;  Laterality: Bilateral;       Home Medications    Prior to Admission medications   Medication Sig Start Date End Date Taking? Authorizing Provider  aspirin EC 81 MG tablet Take 1 tablet (81 mg total) by mouth daily. 03/13/17   Ladell Pier, MD  atorvastatin (LIPITOR) 40 MG tablet Take 1 tablet (40 mg total) by mouth daily. 12/28/16   Funches, Adriana Mccallum, MD    azithromycin (ZITHROMAX Z-PAK) 250 MG tablet Take 2 tablets on day 1 (total of 500 mg). Then take 1 tablet for the next 4 days until all pills are gone. Patient not taking: Reported on 09/26/2017 08/07/17   Thomasene Ripple, MD  cetirizine (ZYRTEC) 10 MG tablet Take 1 tablet (10 mg total) by mouth daily. 09/26/17   Argentina Donovan, PA-C  clotrimazole-betamethasone (LOTRISONE) cream Apply 1 application topically 2 (two) times daily. To the groin X 3 weeks 09/26/17   Argentina Donovan, PA-C  doxycycline (VIBRA-TABS) 100 MG tablet Take 1 tablet (100 mg total) by mouth 2 (two) times daily. 09/26/17   Argentina Donovan, PA-C  furosemide (LASIX) 20 MG tablet Take 1 tablet (20 mg total) by mouth every morning. 08/07/17   Thomasene Ripple, MD  gabapentin (NEURONTIN) 300 MG capsule TAKE 1 CAPSULE (300 MG TOTAL) BY MOUTH THREE TIMES DAILY. 02/22/17   Funches, Adriana Mccallum, MD  glipiZIDE (GLIPIZIDE XL) 10 MG 24 hr tablet Take 1 tablet (10 mg total) by mouth daily with breakfast. 12/28/16   Boykin Nearing, MD  glucose blood (TRUE METRIX BLOOD GLUCOSE TEST) test strip Use as instructed 08/03/17   Ladell Pier, MD  IBU 800 MG tablet Take 800 mg by mouth as needed for pain. 06/27/17   [provider]  Lancets Glory Rosebush ULTRASOFT) lancets Use as instructed 08/03/17   Ladell Pier, MD  lisinopril (PRINIVIL,ZESTRIL) 40 MG tablet Take 1 tablet (40 mg total) by mouth daily. 08/03/17   Ladell Pier, MD  metFORMIN (GLUCOPHAGE) 1000 MG tablet Take 1 tablet (1,000 mg total) by mouth 2 (two) times daily with a meal. 12/28/16   Boykin Nearing, MD  mupirocin ointment (BACTROBAN) 2 % Apply to affected area BID Patient not taking: Reported on 09/26/2017 08/03/17   Ladell Pier, MD  TRUEPLUS LANCETS 28G MISC Use as directed 08/03/17   Ladell Pier, MD    Family History Family History  Problem Relation Age of Onset  . Hypertension Mother   . Diabetes Mother   . Cancer Mother        breast cancer   .  Hypertension Father   . Heart disease Father   . Stroke Father   . Hypertension Sister     Social History Social History   Tobacco Use  . Smoking status: Current Every Day Smoker    Packs/day: 0.25    Types: Cigarettes  . Smokeless tobacco: Never Used  Substance Use Topics  . Alcohol use: Yes    Alcohol/week: 1.2 - 1.8 oz    Types: 2 - 3 Cans of beer per week    Comment: occassional  . Drug use: Yes    Types: "Crack" cocaine, Cocaine    Comment: once a month     Allergies   Amlodipine   Review of Systems Review of Systems  Constitutional: Negative for chills and fever.  HENT: Negative for facial swelling  and trouble swallowing.   Eyes: Negative for visual disturbance.  Respiratory: Negative for shortness of breath.   Cardiovascular: Negative for chest pain.  Gastrointestinal: Negative for abdominal pain, diarrhea, nausea and vomiting.  Genitourinary: Positive for hematuria. Negative for difficulty urinating, dysuria, flank pain, penile swelling, scrotal swelling and testicular pain.  Musculoskeletal: Negative for arthralgias and gait problem.  Skin: Positive for color change (dark dry spots on his right arm and right leg) and rash (groin area). Negative for wound.  Neurological: Negative for weakness, numbness and headaches.  Psychiatric/Behavioral: Negative for agitation.     Physical Exam Updated Vital Signs BP (!) 149/67 (BP Location: Right Arm)   Pulse (!) 59   Temp 97.7 F (36.5 C) (Oral)   Resp 14   Ht 6\' 3"  (1.905 m)   Wt 125.2 kg (276 lb)   SpO2 100%   BMI 34.50 kg/m   Physical Exam  Constitutional: He is oriented to person, place, and time. He appears well-developed and well-nourished. No distress.  HENT:  Head: Normocephalic and atraumatic.  Mouth/Throat: Oropharynx is clear and moist. No oropharyngeal exudate.  Eyes: Conjunctivae are normal. Pupils are equal, round, and reactive to light. Right eye exhibits no discharge. Left eye exhibits no  discharge.  Neck: Normal range of motion. Neck supple.  Cardiovascular: Normal rate and regular rhythm. Exam reveals no friction rub.  No murmur heard. Pulmonary/Chest: Effort normal and breath sounds normal. No stridor. No respiratory distress. He has no wheezes. He has no rales.  Abdominal: Soft. Bowel sounds are normal. There is no tenderness. There is no guarding.  Genitourinary:  Genitourinary Comments: Chaperone present for exam. Erythematous patchy rash with satellite lesions on the left groin area. Does not extend to the penis. No overlying warmth or induration. Non-tender to touch. No inguinal adenopathy. No signs of lesion or erythema on the penis or testicles. The penis and testicles are nontender. No testicular masses or swelling.   Lymphadenopathy:    He has no cervical adenopathy.  Neurological: He is alert and oriented to person, place, and time. Coordination normal.  Skin: Skin is warm and dry. Capillary refill takes less than 2 seconds. He is not diaphoretic.  Several dry, dark bumps on right upper arm.  One dry dark patch on right upper leg.  No erythema, warmth or induration.  No pustules, vesicles or target lesions.   Psychiatric: He has a normal mood and affect. His behavior is normal.  Nursing note and vitals reviewed.        ED Treatments / Results  Labs (all labs ordered are listed, but only abnormal results are displayed) Labs Reviewed  URINALYSIS, ROUTINE W REFLEX MICROSCOPIC - Abnormal; Notable for the following components:      Result Value   Ketones, ur 5 (*)    Protein, ur 30 (*)    Squamous Epithelial / LPF 0-5 (*)    All other components within normal limits  BASIC METABOLIC PANEL - Abnormal; Notable for the following components:   BUN 22 (*)    Creatinine, Ser 1.51 (*)    GFR calc non Af Amer 49 (*)    GFR calc Af Amer 57 (*)    All other components within normal limits  CBC    EKG  EKG Interpretation None       Radiology No results  found.  Procedures Procedures (including critical care time)  Medications Ordered in ED Medications - No data to display   Initial Impression / Assessment  and Plan / ED Course  I have reviewed the triage vital signs and the nursing notes.  Pertinent labs & imaging results that were available during my care of the patient were reviewed by me and considered in my medical decision making (see chart for details).    Presents with multiple complaints  Hematuria Painless hematuria which has been intermittent for the past week now. He denies history of previous.  UA without RBCs and no evidence of infection.  BMP reveals mildly elevated creatinine (1.51 versus 1.08 one month ago.)  No concern for nephrolithiasis given no flank pain or nausea/vomiting.  Plan to have patient follow-up with urology for further workup and evaluation of painless hematuria. Have given him follow up information in his discharge paperwork and patient agrees and voiced understanding to the plan.  Itchy red rash on groin Tinea cruris. Patient discharged with topical antifungal cream and instructed to apply BID. Given instructions to return if any change or worsening, or development of fever.   Dry spots on arm and leg Rash ongoing for a month.  Appears to be related to dry skin. Patient without any difficulty breathing or swallowing. No blisters, no pustules, no warmth, no draining sinus tracts, no superficial abscesses, no bullous impetigo, no vesicles, no desquamation, no target lesions with dusky purpura or a central bulla. Not tender to touch. No concern for superimposed infection. No concern for SJS, TEN, TSS, tick borne illness, syphilis or other life-threatening condition. Will discharge home with instructions to use hydrating lotion and hydroxyzine prescribed for itch. Patient instructed to follow up with his primary doctor in a week for recheck and return if any worsening. His blood pressure was elevated in the  emergency department, counseled him to have this rechecked by PCP. Patient agrees and voices understanding to the above plan. Discussed this patient with Dr. Alvino Chapel who agrees with plan.    Final Clinical Impressions(s) / ED Diagnoses   Final diagnoses:  History of hematuria  Tinea cruris    ED Discharge Orders        Ordered    clotrimazole (LOTRIMIN) 1 % cream     09/30/17 2105    hydrOXYzine (ATARAX/VISTARIL) 25 MG tablet  Every 6 hours     09/30/17 2109       Glyn Ade, PA-C 10/01/17 1440    Davonna Belling, MD 10/03/17 [phone]

## 2017-09-30 NOTE — ED Triage Notes (Signed)
Pt reports 1 week of blood in urine, denies painful urination. States he saw his PCP this week but they did not check his urine, states he was there to have boils on his groin evaluation. States he has bumps to groin and buttocks, that they wrote him a prescription for cetirizine and doxy and sent him home. Pt is taking them as directed.

## 2017-09-30 NOTE — Discharge Instructions (Signed)
Please call the kidney doctor and schedule an appointment for follow-up of your ER visit today.  Information listed below (Dr. Louis Meckel.)  You did not have any blood in the urine today, but please follow up given this happens intermittently. Please also have your kidney function rechecked (creatinine 1.51.)   I have written you a prescription for an antifungal medicine.  Please apply cream to the left groin area twice a day.  Keep your skin hydrated with moisturizing over the counter lotion. I have written you a prescription for hydrozyzine to take as needed for itch.   Return to the ER if you develop fever, abdominal pain, vomiting or have a worsening rash.

## 2017-10-02 ENCOUNTER — Telehealth: Payer: Self-pay | Admitting: Internal Medicine

## 2017-10-02 NOTE — Telephone Encounter (Signed)
Kidney doctor, [place] gave

## 2017-10-04 DIAGNOSIS — R3912 Poor urinary stream: Secondary | ICD-10-CM | POA: Diagnosis not present

## 2017-10-04 DIAGNOSIS — N182 Chronic kidney disease, stage 2 (mild): Secondary | ICD-10-CM | POA: Diagnosis not present

## 2017-10-04 DIAGNOSIS — N401 Enlarged prostate with lower urinary tract symptoms: Secondary | ICD-10-CM | POA: Diagnosis not present

## 2017-10-04 DIAGNOSIS — R310 Gross hematuria: Secondary | ICD-10-CM | POA: Diagnosis not present

## 2017-10-08 ENCOUNTER — Telehealth: Payer: Self-pay | Admitting: Internal Medicine

## 2017-10-08 ENCOUNTER — Encounter: Payer: Self-pay | Admitting: Internal Medicine

## 2017-10-08 ENCOUNTER — Ambulatory Visit (HOSPITAL_BASED_OUTPATIENT_CLINIC_OR_DEPARTMENT_OTHER): Payer: Medicare Other | Admitting: Internal Medicine

## 2017-10-08 VITALS — BP 183/96 | HR 61 | Temp 98.4°F | Resp 16 | Wt 273.2 lb

## 2017-10-08 DIAGNOSIS — I10 Essential (primary) hypertension: Secondary | ICD-10-CM | POA: Insufficient documentation

## 2017-10-08 DIAGNOSIS — E1169 Type 2 diabetes mellitus with other specified complication: Secondary | ICD-10-CM

## 2017-10-08 DIAGNOSIS — E785 Hyperlipidemia, unspecified: Secondary | ICD-10-CM

## 2017-10-08 LAB — GLUCOSE, POCT (MANUAL RESULT ENTRY): POC Glucose: 89 mg/dl (ref 70–99)

## 2017-10-08 NOTE — Progress Notes (Signed)
Patient left before being seen.  He rescheduled.  Blood pressure was elevated.  We will have nurse follow-up with him to make sure he is taking his blood pressure medication.

## 2017-10-09 ENCOUNTER — Telehealth: Payer: Self-pay | Admitting: Internal Medicine

## 2017-10-09 NOTE — Telephone Encounter (Signed)
Patient called requesting to speak with cma regarding missed call yesterday.

## 2017-10-09 NOTE — Telephone Encounter (Signed)
Patient stated he is taking medications as prescribed. Patient stated his bp was probably elevated due to him being mad for having to wait the period of time he did.

## 2017-10-09 NOTE — Telephone Encounter (Signed)
Contacted pt and left a detailed vm informing pt of Dr. Wynetta Emery message and if he could give me a call at his earliest convenience

## 2017-10-12 DIAGNOSIS — R310 Gross hematuria: Secondary | ICD-10-CM | POA: Diagnosis not present

## 2017-10-15 ENCOUNTER — Telehealth: Payer: Self-pay | Admitting: Internal Medicine

## 2017-10-15 NOTE — Telephone Encounter (Signed)
5 page, office visit report received through fax 10-15-17.

## 2017-10-22 ENCOUNTER — Other Ambulatory Visit: Payer: Self-pay | Admitting: Pharmacist

## 2017-10-22 DIAGNOSIS — E118 Type 2 diabetes mellitus with unspecified complications: Secondary | ICD-10-CM

## 2017-10-22 MED ORDER — GLIPIZIDE ER 10 MG PO TB24: 10.0000 mg | ORAL_TABLET | Freq: Every day | ORAL | 0 refills | Status: DC

## 2017-10-22 MED ORDER — METFORMIN HCL 1000 MG PO TABS: 1000.0000 mg | ORAL_TABLET | Freq: Two times a day (BID) | ORAL | 0 refills | Status: DC

## 2017-10-22 MED ORDER — ATORVASTATIN CALCIUM 40 MG PO TABS: 40.0000 mg | ORAL_TABLET | Freq: Every day | ORAL | 0 refills | Status: DC

## 2017-10-24 DIAGNOSIS — Z79899 Other long term (current) drug therapy: Secondary | ICD-10-CM | POA: Diagnosis not present

## 2017-10-31 ENCOUNTER — Other Ambulatory Visit: Payer: Self-pay | Admitting: Urology

## 2017-10-31 DIAGNOSIS — R310 Gross hematuria: Secondary | ICD-10-CM | POA: Diagnosis not present

## 2017-11-06 ENCOUNTER — Ambulatory Visit: Payer: Medicare Other | Attending: Internal Medicine | Admitting: Internal Medicine

## 2017-11-06 ENCOUNTER — Encounter: Payer: Self-pay | Admitting: Internal Medicine

## 2017-11-06 ENCOUNTER — Other Ambulatory Visit: Payer: Self-pay

## 2017-11-06 VITALS — BP 148/84 | HR 72 | Temp 98.0°F | Resp 16 | Wt 275.8 lb

## 2017-11-06 DIAGNOSIS — Z833 Family history of diabetes mellitus: Secondary | ICD-10-CM | POA: Insufficient documentation

## 2017-11-06 DIAGNOSIS — E669 Obesity, unspecified: Secondary | ICD-10-CM | POA: Insufficient documentation

## 2017-11-06 DIAGNOSIS — F17210 Nicotine dependence, cigarettes, uncomplicated: Secondary | ICD-10-CM | POA: Insufficient documentation

## 2017-11-06 DIAGNOSIS — M48061 Spinal stenosis, lumbar region without neurogenic claudication: Secondary | ICD-10-CM | POA: Insufficient documentation

## 2017-11-06 DIAGNOSIS — E118 Type 2 diabetes mellitus with unspecified complications: Secondary | ICD-10-CM | POA: Diagnosis not present

## 2017-11-06 DIAGNOSIS — Z7984 Long term (current) use of oral hypoglycemic drugs: Secondary | ICD-10-CM | POA: Insufficient documentation

## 2017-11-06 DIAGNOSIS — Z7982 Long term (current) use of aspirin: Secondary | ICD-10-CM | POA: Diagnosis not present

## 2017-11-06 DIAGNOSIS — I110 Hypertensive heart disease with heart failure: Secondary | ICD-10-CM | POA: Insufficient documentation

## 2017-11-06 DIAGNOSIS — F17200 Nicotine dependence, unspecified, uncomplicated: Secondary | ICD-10-CM | POA: Diagnosis not present

## 2017-11-06 DIAGNOSIS — Z8673 Personal history of transient ischemic attack (TIA), and cerebral infarction without residual deficits: Secondary | ICD-10-CM | POA: Diagnosis not present

## 2017-11-06 DIAGNOSIS — I10 Essential (primary) hypertension: Secondary | ICD-10-CM | POA: Diagnosis not present

## 2017-11-06 DIAGNOSIS — E1142 Type 2 diabetes mellitus with diabetic polyneuropathy: Secondary | ICD-10-CM | POA: Insufficient documentation

## 2017-11-06 DIAGNOSIS — Z716 Tobacco abuse counseling: Secondary | ICD-10-CM

## 2017-11-06 DIAGNOSIS — Z6834 Body mass index (BMI) 34.0-34.9, adult: Secondary | ICD-10-CM | POA: Insufficient documentation

## 2017-11-06 DIAGNOSIS — Z803 Family history of malignant neoplasm of breast: Secondary | ICD-10-CM | POA: Insufficient documentation

## 2017-11-06 DIAGNOSIS — E119 Type 2 diabetes mellitus without complications: Secondary | ICD-10-CM | POA: Diagnosis present

## 2017-11-06 DIAGNOSIS — Z823 Family history of stroke: Secondary | ICD-10-CM | POA: Insufficient documentation

## 2017-11-06 DIAGNOSIS — R21 Rash and other nonspecific skin eruption: Secondary | ICD-10-CM | POA: Diagnosis not present

## 2017-11-06 DIAGNOSIS — Z888 Allergy status to other drugs, medicaments and biological substances status: Secondary | ICD-10-CM | POA: Diagnosis not present

## 2017-11-06 DIAGNOSIS — E11649 Type 2 diabetes mellitus with hypoglycemia without coma: Secondary | ICD-10-CM | POA: Insufficient documentation

## 2017-11-06 DIAGNOSIS — E113299 Type 2 diabetes mellitus with mild nonproliferative diabetic retinopathy without macular edema, unspecified eye: Secondary | ICD-10-CM | POA: Insufficient documentation

## 2017-11-06 DIAGNOSIS — F1411 Cocaine abuse, in remission: Secondary | ICD-10-CM | POA: Diagnosis not present

## 2017-11-06 DIAGNOSIS — E785 Hyperlipidemia, unspecified: Secondary | ICD-10-CM | POA: Diagnosis not present

## 2017-11-06 DIAGNOSIS — L84 Corns and callosities: Secondary | ICD-10-CM | POA: Insufficient documentation

## 2017-11-06 DIAGNOSIS — I5030 Unspecified diastolic (congestive) heart failure: Secondary | ICD-10-CM | POA: Insufficient documentation

## 2017-11-06 DIAGNOSIS — Z8249 Family history of ischemic heart disease and other diseases of the circulatory system: Secondary | ICD-10-CM | POA: Insufficient documentation

## 2017-11-06 DIAGNOSIS — Z79899 Other long term (current) drug therapy: Secondary | ICD-10-CM | POA: Diagnosis not present

## 2017-11-06 DIAGNOSIS — R319 Hematuria, unspecified: Secondary | ICD-10-CM | POA: Diagnosis not present

## 2017-11-06 DIAGNOSIS — H9190 Unspecified hearing loss, unspecified ear: Secondary | ICD-10-CM | POA: Diagnosis not present

## 2017-11-06 LAB — GLUCOSE, POCT (MANUAL RESULT ENTRY)
POC Glucose: 61 mg/dl — AB (ref 70–99)
POC Glucose: 102 mg/dl — AB (ref 70–99)

## 2017-11-06 LAB — POCT GLYCOSYLATED HEMOGLOBIN (HGB A1C): Hemoglobin A1C: 6.8

## 2017-11-06 MED ORDER — TRUEPLUS LANCETS 28G MISC: 6 refills | Status: DC

## 2017-11-06 MED ORDER — TRUE METRIX METER W/DEVICE KIT: PACK | 0 refills | Status: DC

## 2017-11-06 MED ORDER — TRIAMCINOLONE ACETONIDE 0.1 % EX CREA: 1.0000 "application " | TOPICAL_CREAM | Freq: Two times a day (BID) | CUTANEOUS | 0 refills | Status: DC

## 2017-11-06 MED ORDER — LABETALOL HCL 100 MG PO TABS: 50.0000 mg | ORAL_TABLET | Freq: Two times a day (BID) | ORAL | 5 refills | Status: DC

## 2017-11-06 MED ORDER — GLUCOSE BLOOD VI STRP: ORAL_STRIP | 12 refills | Status: DC

## 2017-11-06 NOTE — Progress Notes (Signed)
Patient ID: EMAURI KRYGIER, male    DOB: 09-Jun-1961  MRN: [id number]  CC: Follow-up   Subjective: [name] is a 57 y.o. male who presents for chronic ds management. His concerns today include:  57 year old male with history of DM 2, HTN, dCHF, tob dep, HL, CVA (about 3 yrs ago per pt), spinal stenosis of lumbar spine,chronic lower extremity edema  1. Seen in ED 3/10 for painless hematuria.  Saw [facility]. Had CT urography which was abnormal.  Has cystoscopy with bx schedule for this mth.   2.  Dec hearing.  Re[ported on last visit and referral made to ENT.  No appt as yet for ENT.  3.  HTN/dCHF:  No device device to check BP at home.  Does most of his cooking.  Also has an aide who helps. Mainly bake or broil his meats -No CP, SOB at rest on exertion.  No inc swelling in legs.  Reports legs look much better  -compliant with Furosemide and Lisinopril  4.  DM: needs new meter and stripes sent to [place] Doing well with eating habits. Compliant with metformin and Glucotrol.  Blood sugar was low today at 61.  He ate breakfast but did not eat lunch as yet.  He did not feel that the blood sugar was low.  5.  Tobacco dependence/Substance abuse: Reports that he is doing well with cutting back.  Also reports that he quit cocaine 3 months ago but had a relapse last month for 1 day.  He is determined to remain clean.  He now avoids people and environments that compromise his ability to remain drug free  6. Itchy rash on arms and behind the legs for over 1 mth.  Given Hydroxyzine in ER and told to use hydrating lotion.  This has helped some but rash has not resolved. Pic of rash on LT arm noted in ER provider's note dated 3/10.  Patient Active Problem List   Diagnosis Date Noted  . Cough   . Cellulitis of left lower extremity   . Acute diastolic congestive heart failure ([place])   . Acute pulmonary edema ([place]) 08/04/2017  . Immunization due 05/21/2017  . Edema of both  legs 05/21/2017  . Mild nonproliferative diabetic retinopathy ([place]) 01/10/2016  . Hypertensive retinopathy of both eyes 01/10/2016  . Spinal stenosis of lumbar region 07/15/2015  . Abnormality of gait 06/07/2015  . Hydrocephalus 06/07/2015  . Peripheral neuropathy 04/26/2015  . Constipation 04/26/2015  . Diabetes mellitus type 2 with complications ([place]) 73/53/2992  . Decreased hearing of both ears 03/15/2015  . Tinea pedis 03/15/2015  . Callus of foot 05/22/2014  . Contusion of cervical cord ([place]) 05/22/2014  . Coronary vasospasm, cocaine related 12/29/2013  . Cocaine abuse ([place]) 12/28/2013  . Alcoholism /alcohol abuse ([place]) 12/28/2013  . Left-sided weakness 12/26/2013  . Hyperlipidemia associated with type 2 diabetes mellitus ([place]) 04/01/2007  . OBESITY NOS 03/28/2007  . Essential hypertension 03/28/2007     Current Outpatient Medications on File Prior to Visit  Medication Sig Dispense Refill  . aspirin EC 81 MG tablet Take 1 tablet (81 mg total) by mouth daily. 100 tablet 0  . atorvastatin (LIPITOR) 40 MG tablet Take 1 tablet (40 mg total) by mouth daily. 90 tablet 0  . cetirizine (ZYRTEC) 10 MG tablet Take 1 tablet (10 mg total) by mouth daily. 30 tablet 11  . clotrimazole (LOTRIMIN) 1 % cream Apply to groin area 2 times daily  15 g 0  . clotrimazole-betamethasone (LOTRISONE) cream Apply 1 application topically 2 (two) times daily. To the groin X 3 weeks 30 g 1  . doxycycline (VIBRA-TABS) 100 MG tablet Take 1 tablet (100 mg total) by mouth 2 (two) times daily. 20 tablet 0  . furosemide (LASIX) 20 MG tablet Take 1 tablet (20 mg total) by mouth every morning. 30 tablet 6  . gabapentin (NEURONTIN) 300 MG capsule TAKE 1 CAPSULE (300 MG TOTAL) BY MOUTH THREE TIMES DAILY. 270 capsule 3  . glipiZIDE (GLIPIZIDE XL) 10 MG 24 hr tablet Take 1 tablet (10 mg total) by mouth daily with breakfast. 90 tablet 0  . hydrOXYzine (ATARAX/VISTARIL) 25 MG tablet Take 1 tablet (25 mg total) by mouth  every 6 (six) hours. 12 tablet 0  . IBU 800 MG tablet Take 800 mg by mouth as needed for pain.    Marland Kitchen lisinopril (PRINIVIL,ZESTRIL) 40 MG tablet Take 1 tablet (40 mg total) by mouth daily. 90 tablet 3  . metFORMIN (GLUCOPHAGE) 1000 MG tablet Take 1 tablet (1,000 mg total) by mouth 2 (two) times daily with a meal. 180 tablet 0   No current facility-administered medications on file prior to visit.     Allergies  Allergen Reactions  . Amlodipine Swelling    Lower extremity swelling    Social History   Socioeconomic History  . Marital status: Divorced    Spouse name: Not on file  . Number of children: 0  . Years of education: 10  . Highest education level: Not on file  Occupational History  . Occupation: Unemployed  Social Needs  . Financial resource strain: Not on file  . Food insecurity:    Worry: Not on file    Inability: Not on file  . Transportation needs:    Medical: Not on file    Non-medical: Not on file  Tobacco Use  . Smoking status: Current Every Day Smoker    Packs/day: 0.25    Types: Cigarettes  . Smokeless tobacco: Never Used  Substance and Sexual Activity  . Alcohol use: Yes    Alcohol/week: 1.2 - 1.8 oz    Types: 2 - 3 Cans of beer per week    Comment: occassional  . Drug use: Yes    Types: "Crack" cocaine, Cocaine    Comment: once a month  . Sexual activity: Not on file  Lifestyle  . Physical activity:    Days per week: Not on file    Minutes per session: Not on file  . Stress: Not on file  Relationships  . Social connections:    Talks on phone: Not on file    Gets together: Not on file    Attends religious service: Not on file    Active member of club or organization: Not on file    Attends meetings of clubs or organizations: Not on file    Relationship status: Not on file  . Intimate partner violence:    Fear of current or ex partner: Not on file    Emotionally abused: Not on file    Physically abused: Not on file    Forced sexual activity:  Not on file  Other Topics Concern  . Not on file  Social History Narrative   Lives at home alone.   Right-handed.   Occasional use.    Family History  Problem Relation Age of Onset  . Hypertension Mother   . Diabetes Mother   . Cancer Mother  breast cancer   . Hypertension Father   . Heart disease Father   . Stroke Father   . Hypertension Sister     Past Surgical History:  Procedure Laterality Date  . CARDIAC CATHETERIZATION  12/27/2013  . CARDIAC CATHETERIZATION  12/27/2013   Impression: 1. 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 Moderate disease in the small caliber intermediate branch, 3. Normal LV systolic function  . left arm skin graft    . LEFT HEART CATHETERIZATION WITH CORONARY ANGIOGRAM Bilateral 12/26/2013   Procedure: LEFT HEART CATHETERIZATION WITH CORONARY ANGIOGRAM;  Surgeon: Burnell Blanks, MD;  [facility] CATH LAB;  Service: Cardiovascular;  Laterality: Bilateral;    ROS: Review of Systems Negative except as stated above PHYSICAL EXAM: BP (!) 148/84 (BP Location: Left Arm, Patient Position: Sitting, Cuff Size: Large)   Pulse 72   Temp 98 F (36.7 C) (Oral)   Resp 16   Wt 275 lb 12.8 oz (125.1 kg)   SpO2 100%   BMI 34.47 kg/m   Wt Readings from Last 3 Encounters:  11/06/17 275 lb 12.8 oz (125.1 kg)  10/08/17 273 lb 3.2 oz (123.9 kg)  09/30/17 276 lb (125.2 kg)    Physical Exam  General appearance - alert, well appearing, and in no distress Mental status - alert, oriented to person, place, and time, normal mood, behavior, speech, dress, motor activity, and thought processes Eyes - pupils equal and reactive, extraocular eye movements intact Mouth - mucous membranes moist, pharynx normal without lesions Neck - supple, no significant adenopathy Chest - clear to auscultation, no wheezes, rales or rhonchi, symmetric air entry Heart - normal rate, regular rhythm, normal S1, S2, no murmurs, rubs, clicks or gallops Extremities - trace LE and pedal  edema Skin - scattered hyperpigmented spots on LT upper arm Diabetic Foot Exam - Simple   Simple Foot Form Visual Inspection See comments:  Yes Sensation Testing See comments:  Yes Pulse Check Posterior Tibialis and Dorsalis pulse intact bilaterally:  Yes Comments Pre-ulcerative callous on plantar surface LT bid toe.  Bone deformity laterally.      Results for orders placed or performed in visit on 11/06/17  HgB A1c  Result Value Ref Range   Hemoglobin A1C 6.8   Glucose (CBG)  Result Value Ref Range   POC Glucose 61 (A) 70 - 99 mg/dl  Glucose (CBG)  Result Value Ref Range   POC Glucose 102 (A) 70 - 99 mg/dl    ASSESSMENT AND PLAN: 1. Type 2 diabetes mellitus with complication, without long-term current use of insulin (HCC) A1c at goal.  Blood sugar level today likely due to the fact that he has not eaten in several hours.  He does however have hypoglycemic unawareness.  Prescription given for glucometer and strips.  Patient encouraged to check blood sugars once to twice a day and bring in readings on next visit.  If blood sugars are running low we may need to discontinue or lower the dose of glipizide. - HgB A1c - Glucose (CBG) - Blood Glucose Monitoring Suppl (TRUE METRIX METER) w/Device KIT; Use as directed  Dispense: 1 kit; Refill: 0 - glucose blood (TRUE METRIX BLOOD GLUCOSE TEST) test strip; Check blood sugars 1-2 times daily  Dispense: 100 each; Refill: 12 - TRUEPLUS LANCETS 28G MISC; Use as directed  Dispense: 100 each; Refill: 6 - Glucose (CBG)  2. 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  Patient to check blood sugars once  to twice a day.  If he finds that blood sugars are running a little he will let me know so that we can adjust his medications. -Patient given some orange juice here in the office today with subsequent increase in blood sugar to 102 before being released. - Blood Glucose Monitoring  Suppl (TRUE METRIX METER) w/Device KIT; Use as directed  Dispense: 1 kit; Refill: 0 - glucose blood (TRUE METRIX BLOOD GLUCOSE TEST) test strip; Check blood sugars 1-2 times daily  Dispense: 100 each; Refill: 12 - TRUEPLUS LANCETS 28G MISC; Use as directed  Dispense: 100 each; Refill: 6  3. Tobacco abuse counseling Commended him on cutting back.  Encourage him to continue to work towards quitting tobacco  4. Cocaine abuse in remission [facility]) Commended him on quitting and willingness to avoid people in environments that compromise his desire to remain drug-free.  5. Essential hypertension Not at goal.  I would like to put him on carvedilol or Toprol but given number for above, will use labetalol instead.  Continue lisinopril and furosemide - labetalol (NORMODYNE) 100 MG tablet; Take 0.5 tablets (50 mg total) by mouth 2 (two) times daily.  Dispense: 30 tablet; Refill: 5  6. Diastolic congestive heart failure, unspecified HF chronicity (HCC) -Edema in the lower extremities much less today.  Advised to continue to limit salt in the foods.  Continue lisinopril and furosemide.  7. Rash/skin eruption Questionable etiology.  We will try him with triamcinolone cream.  If it does not resolve we will refer to dermatology. - triamcinolone cream (KENALOG) 0.1 %; Apply 1 application topically 2 (two) times daily.  Dispense: 30 g; Refill: 0  8. Painless hematuria Currently being worked up by urology.  9. Pre-ulcerative corn or callous Followed by podiatry.  Patient was given the opportunity to ask questions.  Patient verbalized understanding of the plan and was able to repeat key elements of the plan.   Orders Placed This Encounter  Procedures  . HgB A1c  . Glucose (CBG)  . Glucose (CBG)     Requested Prescriptions   Signed Prescriptions Disp Refills  . triamcinolone cream (KENALOG) 0.1 % 30 g 0    Sig: Apply 1 application topically 2 (two) times daily.  . Blood Glucose Monitoring Suppl  (TRUE METRIX METER) w/Device KIT 1 kit 0    Sig: Use as directed  . glucose blood (TRUE METRIX BLOOD GLUCOSE TEST) test strip 100 each 12    Sig: Check blood sugars 1-2 times daily  . TRUEPLUS LANCETS 28G MISC 100 each 6    Sig: Use as directed  . labetalol (NORMODYNE) 100 MG tablet 30 tablet 5    Sig: Take 0.5 tablets (50 mg total) by mouth 2 (two) times daily.    Return in about 6 weeks (around 12/18/2017) for BP check.  Karle Plumber, MD, FACP

## 2017-11-06 NOTE — Progress Notes (Signed)
Follow-up appointment-  Refill medications  Referral to ENT Concerns about liver biopsy

## 2017-11-06 NOTE — Patient Instructions (Addendum)
Use the triamcinolone cream for the rash on your arms and legs.  If it does not improve we will refer you to a dermatologist.  Continue to work on cutting back on smoking cigarettes. Continue to remain free of cocaine.  Her blood pressure is not controlled.  We have added a blood pressure medicine, labetalol.  He will take half a tablet twice a day.    I have sent a prescription to your pharmacy for a glucometer and strips to check your blood sugars.  Please check blood sugars at least once to twice a day.  If you fine frequent blood sugars less than 90 please let me know.

## 2017-11-15 ENCOUNTER — Other Ambulatory Visit: Payer: Self-pay

## 2017-11-15 ENCOUNTER — Encounter (HOSPITAL_BASED_OUTPATIENT_CLINIC_OR_DEPARTMENT_OTHER): Payer: Self-pay | Admitting: *Deleted

## 2017-11-15 NOTE — Progress Notes (Addendum)
SPOKE W/ PT VIA PHONE FOR PRE-OP INTERVIEW.  NPO AFTER MN.   ARRIVE AT 0530.  NEEDS ISTAT 8.  CURRENT  CXR AND EKG IN CHART AND Epic.  WILL TAKE LABETALOL AND GABAPENTIN AM DOS W/ SIPS OF WATER.  PT VERBALIZED UNDERSTANDING NO COCAINE, PT STATED HE LAST USED MARCH 2019.

## 2017-11-16 ENCOUNTER — Encounter (HOSPITAL_BASED_OUTPATIENT_CLINIC_OR_DEPARTMENT_OTHER): Payer: Self-pay | Admitting: *Deleted

## 2017-11-19 NOTE — H&P (Signed)
CC: I have blood in my urine.  HPI: [name] is a 57 year-old male established patient who is here for blood in the urine.  He did see the blood in his urine. He first noticed the symptoms approximately 09/24/2017.   He does not have a burning sensation when he urinates. He is not currently having trouble urinating.   He is not having pain.   10/31/17: [name] returns today in f/u for his history of hematuria. He has had no more bleeding and the UA is clear today. The CT showed no GU abnormalities.   10/12/17: GU Hx: [name] is a 57 yo BM who is sent from the ER for a 1 week history of painless gross hematuria. He was seen on 3/10 and his UA was clear in the ER. The urine was dark red without clots on 2 occasions. His Cr was up to 1.51 which was an increase from 1.08 on 08/10/17. He had no associated signs or symptoms. He has a history of polysubstance abuse with cocaine on several drugs screens in 2017 and prior, type 2 DM, HTN and Tobacco use. No imaging was done. The last hematuria episode was Sunday morning. He has no history of stones or UTI's. He has had no GU surgery. He has an IPSS of 1 but on deeper questioning he has hesitancy and a reduced stream. He feels like he empties. He has no incontinence.     CC: AUA Questions Scoring.  HPI:     AUA Symptom Score: He never has the sensation of not emptying his bladder completely after finishing urinating. Less than 20% of the time he has to urinate again fewer than two hours after he has finished urinating. He does not have to stop and start again several times when he urinates. He never finds it difficult to postpone urination. Less than 20% of the time he has a weak urinary stream. He never has to push or strain to begin urination. He never has to get up to urinate from the time he goes to bed until the time he gets up in the morning.   Calculated AUA Symptom Score: 2    ALLERGIES: No Allergies    MEDICATIONS: Doxycycline Hyclate   Lisinopril 40 mg tablet  Metformin Hcl 1,000 mg tablet  Zyrtec  Aspirin Ec 81 mg tablet, delayed release  Atorvastatin Calcium 40 mg tablet  Clotrimazole-Betamethasone  Furosemide 20 mg tablet  Gabapentin 300 mg capsule  Glipizide 10 mg tablet  Mupirocin 2 % cream     GU PSH: Locm 300-399Mg /Ml Iodine,1Ml - 10/12/2017      PSH Notes: Cardiac Cath 12/27/13  Left arm skin graft 1967   NON-GU PSH: No Non-GU PSH    GU PMH: BPH w/LUTS, He has a benign gland with hesitancy and a reduced stream. I am going to check a PSA with the labs. - 10/04/2017 Chronic kidney disease stage 2 (GFR 60-90), His Cr was up to 1.51 from 1.08. - 10/04/2017 Gross hematuria, He had painless hematuria with mild renal insufficiency. I am going to repeat a BUN and CR today and set him up for a CT with f/u for cystoscopy. . - 10/04/2017 Weak Urinary Stream - 10/04/2017    NON-GU PMH: Alcohol abuse, uncomplicated Chronic pain syndrome Cocaine abuse, uncomplicated Diabetes Type 2 Hypertension Unspecified injury at unspecified level of cervical spinal cord, sequela    FAMILY HISTORY: Diabetes - Runs in Family Hypertension - Runs in Family   SOCIAL  HISTORY: Marital Status: Single Preferred Language: English; Race: Black or African American Current Smoking Status: Patient smokes. Smokes less than 1/2 pack per day.   Tobacco Use Assessment Completed: Used Tobacco in last 30 days? Drinks 3 drinks per week. Types of alcohol consumed: Beer.  Patient uses recreational drugs. Uses cocaine. Drinks 2 caffeinated drinks per day.     Notes: He states he is not drinking or using crack anymore.    REVIEW OF SYSTEMS:    GU Review Male:   Patient denies frequent urination, hard to postpone urination, burning/ pain with urination, get up at night to urinate, leakage of urine, stream starts and stops, trouble starting your stream, have to strain to urinate , erection problems, and penile pain.  Gastrointestinal (Upper):    Patient denies nausea, vomiting, and indigestion/ heartburn.  Gastrointestinal (Lower):   Patient denies diarrhea and constipation.  Constitutional:   Patient denies fever, night sweats, weight loss, and fatigue.  Skin:   Patient denies skin rash/ lesion and itching.  Eyes:   Patient denies blurred vision and double vision.  Ears/ Nose/ Throat:   Patient denies sore throat and sinus problems.  Hematologic/Lymphatic:   Patient denies swollen glands and easy bruising.  Cardiovascular:   Patient denies leg swelling and chest pains.  Respiratory:   Patient denies cough and shortness of breath.  Endocrine:   Patient denies excessive thirst.  Musculoskeletal:   Patient denies back pain and joint pain.  Neurological:   Patient denies headaches and dizziness.  Psychologic:   Patient denies depression and anxiety.   VITAL SIGNS:      10/31/2017 09:22 AM  BP 154/81 mmHg  Heart Rate 60 /min  Temperature 97.2 F / 36.2 C   PAST DATA REVIEWED:  Source Of History:  Patient  Lab Test Review:   PSA  Records Review:   AUA Symptom Score  Urine Test Review:   Urinalysis  X-Ray Review: C.T. Hematuria: Reviewed Films. Reviewed Report. Discussed With Patient. negative    10/04/17  PSA  Total PSA 0.63 ng/mL   Notes:                     PSA is normal.   PROCEDURES:         Flexible Cystoscopy - 52000  Risks, benefits, and some of the potential complications of the procedure were discussed. 63ml of 2% lidocaine jelly was instilled intraurethrally.     Meatus:  Normal size. Normal location. Normal condition.  Urethra:  No strictures.  External Sphincter:  Normal.  Verumontanum:  Normal.  Prostate:  Borderline obstructing. Mild hyperplasia. 2cm bilobar.  Bladder Neck:  Non-obstructing.  Ureteral Orifices:  Normal location. Normal size. Normal shape. Effluxed clear urine.  Bladder:  Mild trabeculation. Erythematous mucosa 2-3 small 2-40mm lesions on the posterior wall of indeterminate significance. No  tumors. No stones.      The procedure was well tolerated and there were no complications.   ASSESSMENT:      ICD-10 Details  1 GU:   Gross hematuria - R31.0 He has small lesions on the posterior bladder wall that could be CIS. I will send a cytology and set him up for a cystoscopy with biopsy as an outpatient. I have reviewed the risks of bleeding, infection, bladder wall injury, thrombotic events and anesthetic complications.   2   Bladder tumor/neoplasm - D41.4      PLAN:           Orders Labs  Urine Cytology          Schedule Return Visit/Planned Activity: Next Available Appointment - Schedule Surgery

## 2017-11-19 NOTE — Anesthesia Preprocedure Evaluation (Addendum)
Anesthesia Evaluation  Patient identified by MRN, date of birth, ID band Patient awake    Reviewed: Allergy & Precautions, NPO status , Patient's Chart, lab work & pertinent test results  Airway Mallampati: III  TM Distance: >3 FB Neck ROM: Full    Dental  (+) Missing,    Pulmonary Current Smoker,    Pulmonary exam normal breath sounds clear to auscultation       Cardiovascular hypertension, Pt. on medications and Pt. on home beta blockers + CAD and + Past MI  Normal cardiovascular exam Rhythm:Regular Rate:Normal  ECG: SR, rate 83  ECHO: LV EF: 55%   Neuro/Psych PSYCHIATRIC DISORDERS Left-sided weakness Gait instability TIA Neuromuscular disease    GI/Hepatic negative GI ROS, (+)     substance abuse  ,   Endo/Other  diabetes, Oral Hypoglycemic Agents  Renal/GU Renal disease     Musculoskeletal Chronic pain   Abdominal (+) + obese,   Peds  Hematology HLD   Anesthesia Other Findings BLADDER LESIONS Ambulates with cane  Reproductive/Obstetrics                          Anesthesia Physical Anesthesia Plan  ASA: III  Anesthesia Plan: General   Post-op Pain Management:    Induction: Intravenous  PONV Risk Score and Plan: 1 and Ondansetron, Dexamethasone and Treatment may vary due to age or medical condition  Airway Management Planned: LMA  Additional Equipment:   Intra-op Plan:   Post-operative Plan: Extubation in OR  Informed Consent: I have reviewed the patients History and Physical, chart, labs and discussed the procedure including the risks, benefits and alternatives for the proposed anesthesia with the patient or authorized representative who has indicated his/her understanding and acceptance.   Dental advisory given  Plan Discussed with: CRNA  Anesthesia Plan Comments:         Anesthesia Quick Evaluation

## 2017-11-20 ENCOUNTER — Other Ambulatory Visit: Payer: Self-pay | Admitting: Physician Assistant

## 2017-11-20 ENCOUNTER — Ambulatory Visit (HOSPITAL_BASED_OUTPATIENT_CLINIC_OR_DEPARTMENT_OTHER): Payer: Medicare Other | Admitting: Anesthesiology

## 2017-11-20 ENCOUNTER — Encounter (HOSPITAL_BASED_OUTPATIENT_CLINIC_OR_DEPARTMENT_OTHER): Payer: Self-pay | Admitting: *Deleted

## 2017-11-20 ENCOUNTER — Other Ambulatory Visit: Payer: Self-pay

## 2017-11-20 ENCOUNTER — Ambulatory Visit (HOSPITAL_BASED_OUTPATIENT_CLINIC_OR_DEPARTMENT_OTHER)
Admission: RE | Admit: 2017-11-20 | Discharge: 2017-11-20 | Disposition: A | Discharge status: Home / Self Care | Payer: Medicare Other | Source: Ambulatory Visit | Attending: Urology | Admitting: Urology

## 2017-11-20 ENCOUNTER — Encounter (HOSPITAL_BASED_OUTPATIENT_CLINIC_OR_DEPARTMENT_OTHER)
Admission: RE | Disposition: A | Discharge status: Home / Self Care | Payer: Self-pay | Source: Ambulatory Visit | Attending: Urology

## 2017-11-20 DIAGNOSIS — F1410 Cocaine abuse, uncomplicated: Secondary | ICD-10-CM | POA: Diagnosis not present

## 2017-11-20 DIAGNOSIS — N3289 Other specified disorders of bladder: Secondary | ICD-10-CM | POA: Diagnosis not present

## 2017-11-20 DIAGNOSIS — N182 Chronic kidney disease, stage 2 (mild): Secondary | ICD-10-CM | POA: Diagnosis not present

## 2017-11-20 DIAGNOSIS — N329 Bladder disorder, unspecified: Secondary | ICD-10-CM | POA: Diagnosis not present

## 2017-11-20 DIAGNOSIS — Z7982 Long term (current) use of aspirin: Secondary | ICD-10-CM | POA: Diagnosis not present

## 2017-11-20 DIAGNOSIS — G894 Chronic pain syndrome: Secondary | ICD-10-CM | POA: Diagnosis not present

## 2017-11-20 DIAGNOSIS — I129 Hypertensive chronic kidney disease with stage 1 through stage 4 chronic kidney disease, or unspecified chronic kidney disease: Secondary | ICD-10-CM | POA: Diagnosis not present

## 2017-11-20 DIAGNOSIS — Z7984 Long term (current) use of oral hypoglycemic drugs: Secondary | ICD-10-CM | POA: Insufficient documentation

## 2017-11-20 DIAGNOSIS — E119 Type 2 diabetes mellitus without complications: Secondary | ICD-10-CM | POA: Diagnosis not present

## 2017-11-20 DIAGNOSIS — F17210 Nicotine dependence, cigarettes, uncomplicated: Secondary | ICD-10-CM | POA: Insufficient documentation

## 2017-11-20 DIAGNOSIS — R319 Hematuria, unspecified: Secondary | ICD-10-CM | POA: Insufficient documentation

## 2017-11-20 DIAGNOSIS — Z79899 Other long term (current) drug therapy: Secondary | ICD-10-CM | POA: Insufficient documentation

## 2017-11-20 DIAGNOSIS — E1122 Type 2 diabetes mellitus with diabetic chronic kidney disease: Secondary | ICD-10-CM | POA: Insufficient documentation

## 2017-11-20 DIAGNOSIS — R310 Gross hematuria: Secondary | ICD-10-CM | POA: Diagnosis not present

## 2017-11-20 DIAGNOSIS — I5031 Acute diastolic (congestive) heart failure: Secondary | ICD-10-CM | POA: Diagnosis not present

## 2017-11-20 DIAGNOSIS — I110 Hypertensive heart disease with heart failure: Secondary | ICD-10-CM | POA: Diagnosis not present

## 2017-11-20 DIAGNOSIS — B356 Tinea cruris: Secondary | ICD-10-CM

## 2017-11-20 HISTORY — DX: Chronic diastolic (congestive) heart failure: I50.32

## 2017-11-20 HISTORY — DX: Hematuria, unspecified: R31.9

## 2017-11-20 HISTORY — DX: Type 2 diabetes mellitus without complications: E11.9

## 2017-11-20 HISTORY — DX: Bladder disorder, unspecified: N32.9

## 2017-11-20 HISTORY — DX: Personal history of other diseases of the respiratory system: Z87.09

## 2017-11-20 HISTORY — DX: Benign prostatic hyperplasia with lower urinary tract symptoms: N40.1

## 2017-11-20 HISTORY — DX: Chronic kidney disease, stage 2 (mild): N18.2

## 2017-11-20 HISTORY — DX: Congenital hydrocephalus, unspecified: Q03.9

## 2017-11-20 HISTORY — DX: Other obstructive and reflux uropathy: N13.8

## 2017-11-20 HISTORY — DX: Old myocardial infarction: I25.2

## 2017-11-20 HISTORY — DX: Personal history of other (healed) physical injury and trauma: Z87.828

## 2017-11-20 HISTORY — PX: CYSTOSCOPY WITH BIOPSY: SHX5122

## 2017-11-20 HISTORY — DX: Poor urinary stream: R39.12

## 2017-11-20 HISTORY — DX: Hyperlipidemia, unspecified: E78.5

## 2017-11-20 HISTORY — DX: Type 2 diabetes mellitus with unspecified diabetic retinopathy without macular edema: E11.319

## 2017-11-20 HISTORY — DX: Personal history of transient ischemic attack (TIA), and cerebral infarction without residual deficits: Z86.73

## 2017-11-20 HISTORY — DX: Polyneuropathy, unspecified: G62.9

## 2017-11-20 LAB — POCT I-STAT, CHEM 8
BUN: 20 mg/dL (ref 6–20)
CALCIUM ION: 1.21 mmol/L (ref 1.15–1.40)
CREATININE: 1 mg/dL (ref 0.61–1.24)
Chloride: 107 mmol/L (ref 101–111)
Glucose, Bld: 131 mg/dL — ABNORMAL HIGH (ref 65–99)
HEMATOCRIT: 41 % (ref 39.0–52.0)
Hemoglobin: 13.9 g/dL (ref 13.0–17.0)
POTASSIUM: 4.9 mmol/L (ref 3.5–5.1)
Sodium: 143 mmol/L (ref 135–145)
TCO2: 25 mmol/L (ref 22–32)

## 2017-11-20 LAB — GLUCOSE, CAPILLARY: GLUCOSE-CAPILLARY: 129 mg/dL — AB (ref 65–99)

## 2017-11-20 SURGERY — CYSTOSCOPY, WITH BIOPSY
Anesthesia: General | Site: Bladder

## 2017-11-20 MED ORDER — CEFAZOLIN SODIUM 1 G IJ SOLR
INTRAMUSCULAR | Status: AC
Start: 2017-11-20 — End: ?
  Filled 2017-11-20: qty 10

## 2017-11-20 MED ORDER — ACETAMINOPHEN 650 MG RE SUPP
650.0000 mg | RECTAL | Status: DC | PRN
  Filled 2017-11-20: qty 1

## 2017-11-20 MED ORDER — LIDOCAINE HCL (CARDIAC) PF 100 MG/5ML IV SOSY
PREFILLED_SYRINGE | INTRAVENOUS | Status: DC | PRN
  Administered 2017-11-20: 100 mg via INTRAVENOUS

## 2017-11-20 MED ORDER — CEFAZOLIN SODIUM-DEXTROSE 2-4 GM/100ML-% IV SOLN
INTRAVENOUS | Status: AC
  Filled 2017-11-20: qty 100

## 2017-11-20 MED ORDER — SODIUM CHLORIDE 0.9% FLUSH
3.0000 mL | INTRAVENOUS | Status: DC | PRN
  Filled 2017-11-20: qty 3

## 2017-11-20 MED ORDER — ONDANSETRON HCL 4 MG/2ML IJ SOLN
4.0000 mg | Freq: Once | INTRAMUSCULAR | Status: DC | PRN
  Filled 2017-11-20: qty 2

## 2017-11-20 MED ORDER — FENTANYL CITRATE (PF) 100 MCG/2ML IJ SOLN
INTRAMUSCULAR | Status: DC | PRN
  Administered 2017-11-20: 100 ug via INTRAVENOUS

## 2017-11-20 MED ORDER — PROPOFOL 10 MG/ML IV BOLUS
INTRAVENOUS | Status: AC
  Filled 2017-11-20: qty 40

## 2017-11-20 MED ORDER — STERILE WATER FOR IRRIGATION IR SOLN
Status: DC | PRN
  Administered 2017-11-20: 3000 mL

## 2017-11-20 MED ORDER — ONDANSETRON HCL 4 MG/2ML IJ SOLN
INTRAMUSCULAR | Status: DC | PRN
Start: 2017-11-20 — End: 2017-11-20
  Administered 2017-11-20: 4 mg via INTRAVENOUS

## 2017-11-20 MED ORDER — CEFAZOLIN SODIUM-DEXTROSE 2-4 GM/100ML-% IV SOLN
2.0000 g | INTRAVENOUS | Status: AC
  Administered 2017-11-20: 2 g via INTRAVENOUS
  Filled 2017-11-20: qty 100

## 2017-11-20 MED ORDER — MIDAZOLAM HCL 5 MG/5ML IJ SOLN
INTRAMUSCULAR | Status: DC | PRN
  Administered 2017-11-20: 2 mg via INTRAVENOUS

## 2017-11-20 MED ORDER — ONDANSETRON HCL 4 MG/2ML IJ SOLN
INTRAMUSCULAR | Status: AC
  Filled 2017-11-20: qty 2

## 2017-11-20 MED ORDER — MIDAZOLAM HCL 2 MG/2ML IJ SOLN
INTRAMUSCULAR | Status: AC
  Filled 2017-11-20: qty 2

## 2017-11-20 MED ORDER — LACTATED RINGERS IV SOLN
INTRAVENOUS | Status: DC
  Administered 2017-11-20: 06:00:00 via INTRAVENOUS
  Filled 2017-11-20: qty 1000

## 2017-11-20 MED ORDER — SODIUM CHLORIDE 0.9% FLUSH
3.0000 mL | Freq: Two times a day (BID) | INTRAVENOUS | Status: DC
  Filled 2017-11-20: qty 3

## 2017-11-20 MED ORDER — LIDOCAINE 2% (20 MG/ML) 5 ML SYRINGE
INTRAMUSCULAR | Status: AC
Start: 2017-11-20 — End: ?
  Filled 2017-11-20: qty 5

## 2017-11-20 MED ORDER — FENTANYL CITRATE (PF) 100 MCG/2ML IJ SOLN
25.0000 ug | INTRAMUSCULAR | Status: DC | PRN
  Filled 2017-11-20: qty 1

## 2017-11-20 MED ORDER — PROPOFOL 10 MG/ML IV BOLUS
INTRAVENOUS | Status: DC | PRN
  Administered 2017-11-20: 200 mg via INTRAVENOUS

## 2017-11-20 MED ORDER — SODIUM CHLORIDE 0.9 % IV SOLN
250.0000 mL | INTRAVENOUS | Status: DC | PRN
  Filled 2017-11-20: qty 250

## 2017-11-20 MED ORDER — ACETAMINOPHEN 325 MG PO TABS
650.0000 mg | ORAL_TABLET | ORAL | Status: DC | PRN
  Filled 2017-11-20: qty 2

## 2017-11-20 MED ORDER — OXYCODONE HCL 5 MG PO TABS
5.0000 mg | ORAL_TABLET | ORAL | Status: DC | PRN
  Filled 2017-11-20: qty 2

## 2017-11-20 MED ORDER — MORPHINE SULFATE (PF) 2 MG/ML IV SOLN
2.0000 mg | INTRAVENOUS | Status: DC | PRN
  Filled 2017-11-20: qty 1

## 2017-11-20 MED ORDER — FENTANYL CITRATE (PF) 100 MCG/2ML IJ SOLN
INTRAMUSCULAR | Status: AC
  Filled 2017-11-20: qty 2

## 2017-11-20 SURGICAL SUPPLY — 27 items
BAG DRAIN URO-CYSTO SKYTR STRL (DRAIN) ×3 IMPLANT
CATH FOLEY 2WAY SLVR  5CC 16FR (CATHETERS)
CATH FOLEY 2WAY SLVR 5CC 16FR (CATHETERS) IMPLANT
CLOTH BEACON ORANGE TIMEOUT ST (SAFETY) ×3 IMPLANT
ELECT REM PT RETURN 9FT ADLT (ELECTROSURGICAL) ×3
ELECTRODE REM PT RTRN 9FT ADLT (ELECTROSURGICAL) ×1 IMPLANT
GLOVE BIO SURGEONS STER SZ 5.5 (GLOVE) ×6 IMPLANT
GLOVE BIOGEL PI IND STRL 6.5 (GLOVE) ×1 IMPLANT
GLOVE BIOGEL PI IND STRL 7.0 (GLOVE) ×1 IMPLANT
GLOVE BIOGEL PI INDICATOR 6.5 (GLOVE) ×2
GLOVE BIOGEL PI INDICATOR 7.0 (GLOVE) ×2
GLOVE SURG SS PI 8.0 STRL IVOR (GLOVE) ×3 IMPLANT
GOWN STRL REUS W/ TWL LRG LVL3 (GOWN DISPOSABLE) ×2 IMPLANT
GOWN STRL REUS W/ TWL XL LVL3 (GOWN DISPOSABLE) ×1 IMPLANT
GOWN STRL REUS W/TWL LRG LVL3 (GOWN DISPOSABLE) ×4
GOWN STRL REUS W/TWL XL LVL3 (GOWN DISPOSABLE) ×3
KIT TURNOVER CYSTO (KITS) ×3 IMPLANT
MANIFOLD NEPTUNE II (INSTRUMENTS) ×3 IMPLANT
NDL SAFETY ECLIPSE 18X1.5 (NEEDLE) IMPLANT
NEEDLE HYPO 18GX1.5 SHARP (NEEDLE)
NEEDLE HYPO 22GX1.5 SAFETY (NEEDLE) IMPLANT
NS IRRIG 500ML POUR BTL (IV SOLUTION) ×3 IMPLANT
PACK CYSTO (CUSTOM PROCEDURE TRAY) ×3 IMPLANT
SYR 20CC LL (SYRINGE) IMPLANT
TUBE CONNECTING 12'X1/4 (SUCTIONS) ×1
TUBE CONNECTING 12X1/4 (SUCTIONS) ×2 IMPLANT
WATER STERILE IRR 3000ML UROMA (IV SOLUTION) ×3 IMPLANT

## 2017-11-20 NOTE — Anesthesia Procedure Notes (Signed)
Procedure Name: LMA Insertion Date/Time: 11/20/2017 7:28 AM Performed by: Jonna Munro, CRNA Pre-anesthesia Checklist: Patient identified, Emergency Drugs available, Suction available, Patient being monitored and Timeout performed Patient Re-evaluated:Patient Re-evaluated prior to induction Oxygen Delivery Method: Circle system utilized Preoxygenation: Pre-oxygenation with 100% oxygen Induction Type: IV induction LMA: LMA inserted LMA Size: 5.0 Number of attempts: 1 Dental Injury: Teeth and Oropharynx as per pre-operative assessment

## 2017-11-20 NOTE — Op Note (Signed)
Procedure: Cystoscopy with bladder biopsy and fulguration.  Preop diagnosis: Bladder wall lesions.   Postop diagnosis: Bladder wall lesions of the base of the bladder, posterior wall and room.  Surgeon: Dr. [name].  Anesthesia: General.  Specimen: Bladder biopsies from the dome, posterior wall and base of the bladder.  Drain: None.  EBL: None.  Complications: None.  Indications: [name] is a 57 year old male who I saw for hematuria.  On cystoscopy in the office he was found to have small erythematous bladder lesions were felt to possibly represent carcinoma in situ.  Biopsy was felt to be indicated.  Procedure: He was taken to the operating room where he was given 2 g of Ancef.  A general anesthetic was induced.  He was placed in the lithotomy position.  He was fitted with PAS hose.  He was prepped with Betadine solution and draped in usual sterile fashion.  Cystoscopy was performed using the 23 [place] scope and 30 degree lens.  Examination revealed a normal urethra.  The external sphincter is intact.  The prostatic urethra short with minimal lateral lobe hyperplasia but a high bladder neck.  Inspection of the bladder revealed the ureteral orifices anatomic position effluxing clear urine.  The bladder wall had mild trabeculation.  There were small mildly erythematous lesions on the base of the bladder, the posterior wall and the left dome.  The largest measured approximately 5 mm.  After thorough inspection, the 3 lesions were biopsied with a cup biopsy forceps.  The biopsy sites were then generously fulgurated with the Bugbee electrode.  Final inspection revealed no active bleeding and no bladder wall perforation.  It was not felt that a Foley catheter was indicated.  The cystoscope was removed after the bladder was drained.  The patient was taken down from the lithotomy position, his anesthetic was reversed and he was moved to recovery room in stable condition.  There were no  complications.

## 2017-11-20 NOTE — Interval H&P Note (Signed)
History and Physical Interval Note:  11/20/2017 7:18 AM  [name]  has presented today for surgery, with the diagnosis of BLADDER LESIONS  The various methods of treatment have been discussed with the patient and family. After consideration of risks, benefits and other options for treatment, the patient has consented to  Procedure(s): CYSTOSCOPY WITH BIOPSY AND FULGURATION (N/A) as a surgical intervention .  The patient's history has been reviewed, patient examined, no change in status, stable for surgery.  I have reviewed the patient's chart and labs.  Questions were answered to the patient's satisfaction.     [name]

## 2017-11-20 NOTE — Anesthesia Postprocedure Evaluation (Signed)
Anesthesia Post Note  Patient: [name]  Procedure(s) Performed: CYSTOSCOPY WITH BIOPSY AND FULGURATION (N/A Bladder)     Patient location during evaluation: PACU Anesthesia Type: General Level of consciousness: awake and alert Pain management: pain level controlled Vital Signs Assessment: post-procedure vital signs reviewed and stable Respiratory status: spontaneous breathing, nonlabored ventilation, respiratory function stable and patient connected to nasal cannula oxygen Cardiovascular status: blood pressure returned to baseline and stable Postop Assessment: no apparent nausea or vomiting Anesthetic complications: no    Last Vitals:  Vitals:   11/20/17 0830 11/20/17 0854  BP: (!) 151/67 (!) 157/73  Pulse: 66 74  Resp: 14 18  Temp:  36.4 C  SpO2: 100% 99%    Last Pain:  Vitals:   11/20/17 0854  TempSrc: Oral  PainSc:    Pain Goal: Patients Stated Pain Goal: 7 (11/20/17 0545)               Karyl Kinnier Ellender

## 2017-11-20 NOTE — Transfer of Care (Signed)
Immediate Anesthesia Transfer of Care Note  Patient: [name]  Procedure(s) Performed: CYSTOSCOPY WITH BIOPSY AND FULGURATION (N/A Bladder)  Patient Location: PACU  Anesthesia Type:General  Level of Consciousness: awake, alert  and oriented  Airway & Oxygen Therapy: Patient Spontanous Breathing and Patient connected to nasal cannula oxygen  Post-op Assessment: Report given to RN and Post -op Vital signs reviewed and stable  Post vital signs: Reviewed and stable  Last Vitals:  Vitals Value Taken Time  BP    Temp    Pulse 72 11/20/2017  8:06 AM  Resp    SpO2 99 % 11/20/2017  8:06 AM  Vitals shown include unvalidated device data.  Last Pain:  Vitals:   11/20/17 0534  TempSrc: Oral      Patients Stated Pain Goal: 7 (53/79/43 2761)  Complications: No apparent anesthesia complications

## 2017-11-20 NOTE — Discharge Instructions (Signed)
°  Post Anesthesia Home Care Instructions  Activity: Get plenty of rest for the remainder of the day. A responsible individual must stay with you for 24 hours following the procedure.  For the next 24 hours, DO NOT: -Drive a car -Operate machinery -Drink alcoholic beverages -Take any medication unless instructed by your physician -Make any legal decisions or sign important papers.  Meals: Start with liquid foods such as gelatin or soup. Progress to regular foods as tolerated. Avoid greasy, spicy, heavy foods. If nausea and/or vomiting occur, drink only clear liquids until the nausea and/or vomiting subsides. Call your physician if vomiting continues.  Special Instructions/Symptoms: Your throat may feel dry or sore from the anesthesia or the breathing tube placed in your throat during surgery. If this causes discomfort, gargle with warm salt water. The discomfort should disappear within 24 hours.  If you had a scopolamine patch placed behind your ear for the management of post- operative nausea and/or vomiting:  1. The medication in the patch is effective for 72 hours, after which it should be removed.  Wrap patch in a tissue and discard in the trash. Wash hands thoroughly with soap and water. 2. You may remove the patch earlier than 72 hours if you experience unpleasant side effects which may include dry mouth, dizziness or visual disturbances. 3. Avoid touching the patch. Wash your hands with soap and water after contact with the patch.      CYSTOSCOPY HOME CARE INSTRUCTIONS  Activity: Rest for the remainder of the day.  Do not drive or operate equipment today.  You may resume normal activities in one to two days as instructed by your physician.   Meals: Drink plenty of liquids and eat light foods such as gelatin or soup this evening.  You may return to a normal meal plan tomorrow.  Return to Work: You may return to work in one to two days or as instructed by your  physician.  Special Instructions / Symptoms: Call your physician if any of these symptoms occur:   -persistent or heavy bleeding  -bleeding which continues after first few urination  -large blood clots that are difficult to pass  -urine stream diminishes or stops completely  -fever equal to or higher than 101 degrees Farenheit.  -cloudy urine with a strong, foul odor  -severe pain  Females should always wipe from front to back after elimination.  You may feel some burning pain when you urinate.  This should disappear with time.  Applying moist heat to the lower abdomen or a hot tub bath may help relieve the pain. \  

## 2017-11-21 ENCOUNTER — Encounter (HOSPITAL_BASED_OUTPATIENT_CLINIC_OR_DEPARTMENT_OTHER): Payer: Self-pay | Admitting: Urology

## 2017-11-22 DIAGNOSIS — H9193 Unspecified hearing loss, bilateral: Secondary | ICD-10-CM | POA: Diagnosis not present

## 2017-11-22 DIAGNOSIS — H903 Sensorineural hearing loss, bilateral: Secondary | ICD-10-CM | POA: Diagnosis not present

## 2017-11-26 ENCOUNTER — Telehealth: Payer: Self-pay

## 2017-11-26 NOTE — Telephone Encounter (Signed)
JA 

## 2017-11-27 NOTE — Telephone Encounter (Signed)
Will address with pt on f/u later this mth.

## 2017-12-04 ENCOUNTER — Encounter: Payer: Self-pay | Admitting: Internal Medicine

## 2017-12-04 DIAGNOSIS — H903 Sensorineural hearing loss, bilateral: Secondary | ICD-10-CM | POA: Insufficient documentation

## 2017-12-06 ENCOUNTER — Other Ambulatory Visit: Payer: Self-pay

## 2017-12-06 DIAGNOSIS — E118 Type 2 diabetes mellitus with unspecified complications: Secondary | ICD-10-CM

## 2017-12-06 MED ORDER — METFORMIN HCL 1000 MG PO TABS: 1000.0000 mg | ORAL_TABLET | Freq: Two times a day (BID) | ORAL | 0 refills | Status: DC

## 2017-12-06 MED ORDER — GLIPIZIDE ER 10 MG PO TB24: 10.0000 mg | ORAL_TABLET | Freq: Every day | ORAL | 0 refills | Status: DC

## 2017-12-06 MED ORDER — ATORVASTATIN CALCIUM 40 MG PO TABS: 40.0000 mg | ORAL_TABLET | Freq: Every morning | ORAL | 2 refills | Status: DC

## 2017-12-10 DIAGNOSIS — R310 Gross hematuria: Secondary | ICD-10-CM | POA: Diagnosis not present

## 2017-12-11 ENCOUNTER — Encounter: Payer: Self-pay | Admitting: Podiatry

## 2017-12-11 ENCOUNTER — Other Ambulatory Visit: Payer: Self-pay | Admitting: Internal Medicine

## 2017-12-11 ENCOUNTER — Ambulatory Visit (INDEPENDENT_AMBULATORY_CARE_PROVIDER_SITE_OTHER): Payer: Medicare Other | Admitting: Podiatry

## 2017-12-11 DIAGNOSIS — E1142 Type 2 diabetes mellitus with diabetic polyneuropathy: Secondary | ICD-10-CM

## 2017-12-11 DIAGNOSIS — M79674 Pain in right toe(s): Secondary | ICD-10-CM | POA: Diagnosis not present

## 2017-12-11 DIAGNOSIS — B351 Tinea unguium: Secondary | ICD-10-CM

## 2017-12-11 DIAGNOSIS — B356 Tinea cruris: Secondary | ICD-10-CM

## 2017-12-11 DIAGNOSIS — M79675 Pain in left toe(s): Secondary | ICD-10-CM

## 2017-12-13 NOTE — Progress Notes (Signed)
Subjective: 57 y.o. returns the office today for painful, elongated, thickened toenails which he cannot trim himself. Denies any redness or drainage around the nails.  He has a chronic swelling to both of his legs.  This is been ongoing for some time he has seen his primary care physician for this previously.  Denies any acute changes since last appointment and no new complaints today. Denies any systemic complaints such as fevers, chills, nausea, vomiting.   PCP: Ladell Pier, MD  Objective: AAO 3, NAD DP/PT pulses decreased, chronic bilateral lower extremity edema which appears to be chronic. Protective sensation decreased with Derrel Nip monofilament Nails hypertrophic, dystrophic, elongated, brittle, discolored 10. There is tenderness overlying the nails 1-5 bilaterally. There is no surrounding erythema or drainage along the nail sites. There is some dry skin present with associated swelling to the ankles but there is no skin breakdown, fissuring or open sores.  There is no drainage the legs. Area of previous rash has resolved. No other areas of tenderness bilateral lower extremities. No overlying edema, erythema, increased warmth. No pain with calf compression, swelling, warmth, erythema.  Assessment: Patient presents with symptomatic onychomycosis; chronic swelling  Plan: -Treatment options including alternatives, risks, complications were discussed -Nails sharply debrided 10 without complication/bleeding. -Continue elevation.  Continue to follow with his primary care physician regarding the leg swelling.  He has had several venous duplex as previously which were negative for DVT no swelling is unchanged.  Continue to monitor. -Discussed daily foot inspection. If there are any changes, to call the office immediately.  -Follow-up in 3 months or sooner if any problems are to arise. In the meantime, encouraged to call the office with any questions, concerns, changes  symptoms.  Celesta Gentile, DPM

## 2017-12-18 ENCOUNTER — Telehealth: Payer: Self-pay

## 2017-12-18 ENCOUNTER — Ambulatory Visit: Payer: Medicare Other | Attending: Family Medicine | Admitting: Internal Medicine

## 2017-12-18 ENCOUNTER — Other Ambulatory Visit: Payer: Self-pay

## 2017-12-18 VITALS — BP 140/70 | HR 65 | Temp 97.9°F | Resp 16

## 2017-12-18 DIAGNOSIS — M7989 Other specified soft tissue disorders: Secondary | ICD-10-CM | POA: Diagnosis not present

## 2017-12-18 DIAGNOSIS — E1142 Type 2 diabetes mellitus with diabetic polyneuropathy: Secondary | ICD-10-CM | POA: Insufficient documentation

## 2017-12-18 DIAGNOSIS — Z7984 Long term (current) use of oral hypoglycemic drugs: Secondary | ICD-10-CM | POA: Insufficient documentation

## 2017-12-18 DIAGNOSIS — F1410 Cocaine abuse, uncomplicated: Secondary | ICD-10-CM | POA: Insufficient documentation

## 2017-12-18 DIAGNOSIS — E785 Hyperlipidemia, unspecified: Secondary | ICD-10-CM | POA: Insufficient documentation

## 2017-12-18 DIAGNOSIS — F1020 Alcohol dependence, uncomplicated: Secondary | ICD-10-CM | POA: Diagnosis not present

## 2017-12-18 DIAGNOSIS — F17210 Nicotine dependence, cigarettes, uncomplicated: Secondary | ICD-10-CM | POA: Diagnosis not present

## 2017-12-18 DIAGNOSIS — Z888 Allergy status to other drugs, medicaments and biological substances status: Secondary | ICD-10-CM | POA: Insufficient documentation

## 2017-12-18 DIAGNOSIS — R21 Rash and other nonspecific skin eruption: Secondary | ICD-10-CM | POA: Diagnosis not present

## 2017-12-18 DIAGNOSIS — N529 Male erectile dysfunction, unspecified: Secondary | ICD-10-CM | POA: Diagnosis not present

## 2017-12-18 DIAGNOSIS — I10 Essential (primary) hypertension: Secondary | ICD-10-CM | POA: Diagnosis not present

## 2017-12-18 DIAGNOSIS — Z8673 Personal history of transient ischemic attack (TIA), and cerebral infarction without residual deficits: Secondary | ICD-10-CM | POA: Insufficient documentation

## 2017-12-18 DIAGNOSIS — Z79899 Other long term (current) drug therapy: Secondary | ICD-10-CM | POA: Diagnosis not present

## 2017-12-18 DIAGNOSIS — G919 Hydrocephalus, unspecified: Secondary | ICD-10-CM | POA: Insufficient documentation

## 2017-12-18 DIAGNOSIS — Z7982 Long term (current) use of aspirin: Secondary | ICD-10-CM | POA: Insufficient documentation

## 2017-12-18 DIAGNOSIS — M48061 Spinal stenosis, lumbar region without neurogenic claudication: Secondary | ICD-10-CM | POA: Insufficient documentation

## 2017-12-18 DIAGNOSIS — E66812 Obesity, class 2: Secondary | ICD-10-CM

## 2017-12-18 DIAGNOSIS — Z6835 Body mass index (BMI) 35.0-35.9, adult: Secondary | ICD-10-CM

## 2017-12-18 DIAGNOSIS — I252 Old myocardial infarction: Secondary | ICD-10-CM | POA: Diagnosis not present

## 2017-12-18 DIAGNOSIS — I2510 Atherosclerotic heart disease of native coronary artery without angina pectoris: Secondary | ICD-10-CM | POA: Insufficient documentation

## 2017-12-18 MED ORDER — FUROSEMIDE 40 MG PO TABS: 40.0000 mg | ORAL_TABLET | Freq: Every morning | ORAL | 6 refills | Status: DC

## 2017-12-18 MED ORDER — PREDNISONE 10 MG PO TABS: 15.0000 mg | ORAL_TABLET | Freq: Every day | ORAL | 0 refills | Status: DC

## 2017-12-18 MED ORDER — SILDENAFIL CITRATE 25 MG PO TABS: ORAL_TABLET | ORAL | 4 refills | Status: DC

## 2017-12-18 MED ORDER — ONETOUCH ULTRASOFT LANCETS MISC: 12 refills | Status: DC

## 2017-12-18 MED ORDER — FUROSEMIDE 20 MG PO TABS: 40.0000 mg | ORAL_TABLET | Freq: Every morning | ORAL | 6 refills | Status: DC

## 2017-12-18 MED ORDER — ONETOUCH ULTRA 2 W/DEVICE KIT: PACK | 0 refills | Status: DC

## 2017-12-18 MED ORDER — SILDENAFIL CITRATE 50 MG PO TABS: ORAL_TABLET | ORAL | 3 refills | Status: DC

## 2017-12-18 MED ORDER — GLUCOSE BLOOD VI STRP: ORAL_STRIP | 12 refills | Status: DC

## 2017-12-18 MED FILL — !VIAGRA 50 MG TABLET: 50 | 30 days supply | Qty: 10 | Fill #0

## 2017-12-18 NOTE — Patient Instructions (Signed)
Increase Furosemide to 40 mg daily.   Use the Sildenifil as needed for erectile dysfunction.    Use the Prednisone daily for 5 days to see if the rash improves.

## 2017-12-18 NOTE — Telephone Encounter (Signed)
Met with the patient when he was in the clinic today and completed the SCAT application and then faxed the application to SCAT eligibility.

## 2017-12-18 NOTE — Progress Notes (Signed)
[name] arrived to [facility] in good spirtis, alert and oriented. His last OV was 11/06/2017 with Dr. Wynetta Emery.   Pt denies chest pain, SOB, HA, dizziness, or blurred vision. He does c/o  Increased swelling in LLE. Denies drainage.   Verified medication. Pt states medication was taken this morning.  Manual blood pressure reading:   He has also concerns for SCAT application. He was provided assistance in getting form completed today. Also concerns about referral to ENT. He is unable to afford the 210.31 application fee he was told he would need to meet. The other option he states is that he would be provided one.

## 2017-12-18 NOTE — Progress Notes (Signed)
Patient ID: [name], male    DOB: 11-Oct-1960  MRN: [id number]  CC: Leg Swelling   Subjective: [name] is a 57 y.o. male who presents for UC visit.   His concerns today include:  57 year old male with history of DM 2, HTN, dCHF, tob dep, HL, CVA (about 3 yrs ago per pt), spinal stenosis of lumbar spine,chronic lower extremity edema  Patient had presented today as a nurse only blood pressure check.  However he voiced a few concerns to the RN so I agreed to see him.  Patient complains of rash around left ankle x 2 wks. itches intermittently.  He tried the triamcinolone cream that I gave him for the rash on his left upper extremity but so far it has not helped.  It did help with the rash on the left upper arm.  He also expressed concern of swelling in the lower extremities left greater than right x 2 wks. recently seen by his podiatrist who noted the same.  He states that the podiatrist did an Ace wrap around the left lower leg from the knee down.  He was told to keep the leg elevated.  This has not helped in terms of decreasing the swelling.  He denies any pain.  No recent long distance travel.  He reports compliance with furosemide.  He tries to limit salt in his foods as much as he can.  He reports that his weight is up 6 to 7 pounds from the last visit.  He is requesting a diet pill to help with weight loss.  He tries to be careful with his eating habits.  He is requesting prescription for a meter that his insurance would cover in order to check his blood sugars.  He is asking if there is something different he can try for ED.  He has problems getting and maintaining an erection.  Viagra prescribed in the past but his insurance does not cover it.  Patient Active Problem List   Diagnosis Date Noted  . Sensorineural hearing loss of both ears 12/04/2017  . Cellulitis of left lower extremity   . Acute diastolic congestive heart failure ([place])   . Acute pulmonary edema ([place])  08/04/2017  . Immunization due 05/21/2017  . Edema of both legs 05/21/2017  . Mild nonproliferative diabetic retinopathy ([place]) 01/10/2016  . Hypertensive retinopathy of both eyes 01/10/2016  . Spinal stenosis of lumbar region 07/15/2015  . Abnormality of gait 06/07/2015  . Hydrocephalus 06/07/2015  . Peripheral neuropathy 04/26/2015  . Constipation 04/26/2015  . Diabetes mellitus type 2 with complications ([place]) 21/30/8657  . Decreased hearing of both ears 03/15/2015  . Tinea pedis 03/15/2015  . Callus of foot 05/22/2014  . 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 Coronary vasospasm, cocaine related 12/29/2013  . Cocaine abuse ([place]) 12/28/2013  . Alcoholism /alcohol abuse ([place]) 12/28/2013  . Left-sided weakness 12/26/2013  . Hyperlipidemia associated with type 2 diabetes mellitus ([place]) 04/01/2007  . OBESITY NOS 03/28/2007  . Essential hypertension 03/28/2007     Current Outpatient Medications on File Prior to Visit  Medication Sig Dispense Refill  . aspirin EC 81 MG tablet Take 1 tablet (81 mg total) by mouth daily. 100 tablet 0  . atorvastatin (LIPITOR) 40 MG tablet Take 1 tablet (40 mg total) by mouth every morning. 30 tablet 2  . cetirizine (ZYRTEC) 10 MG tablet Take 1 tablet (10 mg total) by mouth daily. (Patient taking differently: Take 10 mg  by mouth daily as needed. ) 30 tablet 11  . clotrimazole (LOTRIMIN) 1 % cream Apply to groin area 2 times daily 15 g 0  . clotrimazole-betamethasone (LOTRISONE) cream APPLY 1 APPLICATION TWICE A DAY TO THE GROIN FOR THREE WEEKS 30 g 1  . gabapentin (NEURONTIN) 300 MG capsule TAKE 1 CAPSULE (300 MG TOTAL) BY MOUTH THREE TIMES DAILY. 270 capsule 3  . glipiZIDE (GLIPIZIDE XL) 10 MG 24 hr tablet Take 1 tablet (10 mg total) by mouth daily with breakfast. 90 tablet 0  . IBU 800 MG tablet Take 800 mg by mouth as needed for pain.    Marland Kitchen labetalol (NORMODYNE) 100 MG tablet Take 0.5 tablets (50 mg total) by mouth 2 (two) times daily. (Patient  taking differently: Take 50 mg by mouth 2 (two) times daily. ) 30 tablet 5  . lisinopril (PRINIVIL,ZESTRIL) 40 MG tablet Take 1 tablet (40 mg total) by mouth daily. (Patient taking differently: Take 40 mg by mouth every morning. ) 90 tablet 3  . metFORMIN (GLUCOPHAGE) 1000 MG tablet Take 1 tablet (1,000 mg total) by mouth 2 (two) times daily with a meal. 180 tablet 0  . triamcinolone cream (KENALOG) 0.1 % Apply 1 application topically 2 (two) times daily. 30 g 0  . TRUEPLUS LANCETS 28G MISC Use as directed 100 each 6  . Blood Glucose Monitoring Suppl (TRUE METRIX METER) w/Device KIT Use as directed (Patient not taking: Reported on 12/18/2017) 1 kit 0  . glucose blood (TRUE METRIX BLOOD GLUCOSE TEST) test strip Check blood sugars 1-2 times daily 100 each 12   No current facility-administered medications on file prior to visit.     Allergies  Allergen Reactions  . Amlodipine Swelling    Lower extremity swelling    Social History   Socioeconomic History  . Marital status: Divorced    Spouse name: Not on file  . Number of children: 0  . Years of education: 10  . Highest education level: Not on file  Occupational History  . Occupation: Unemployed  Social Needs  . Financial resource strain: Not on file  . Food insecurity:    Worry: Not on file    Inability: Not on file  . Transportation needs:    Medical: Not on file    Non-medical: Not on file  Tobacco Use  . Smoking status: Current Every Day Smoker    Years: 45.00    Types: Cigarettes  . Smokeless tobacco: Never Used  . Tobacco comment: 11-15-2017  per pt 1pp3days  Substance and Sexual Activity  . Alcohol use: Not Currently    Alcohol/week: 0.0 oz    Comment: 11-15-2017  per pt last alcohol 12/ 2018  alcohol abuse  . Drug use: Yes    Types: "Crack" cocaine, Cocaine    Comment: 11-15-2017  per pt last used March 2019  . Sexual activity: Not on file  Lifestyle  . Physical activity:    Days per week: Not on file    Minutes  per session: Not on file  . Stress: Not on file  Relationships  . Social connections:    Talks on phone: Not on file    Gets together: Not on file    Attends religious service: Not on file    Active member of club or organization: Not on file    Attends meetings of clubs or organizations: Not on file    Relationship status: Not on file  . Intimate partner violence:    Fear  of current or ex partner: Not on file    Emotionally abused: Not on file    Physically abused: Not on file    Forced sexual activity: Not on file  Other Topics Concern  . Not on file  Social History Narrative   Lives at home alone.   Right-handed.   Occasional use.    Family History  Problem Relation Age of Onset  . Hypertension Mother   . Diabetes Mother   . Cancer Mother        breast cancer   . Hypertension Father   . Heart disease Father   . Stroke Father   . Hypertension Sister     Past Surgical History:  Procedure Laterality Date  . CARDIAC CATHETERIZATION  12/01/2008   dr hochrein   abnormal stress myoview:  mild coronary plaque, normal LVF  . CARDIAC CATHETERIZATION  04/29/2011   dr [place]   in setting ECG with new ST elevation & severe hypertensive:  nonobstructive atherosclerotic CAD, normal LVF (30% mRCA)   . CARDIOVASCULAR STRESS TEST  08/15/2010   Low nuclear study w/ no evidence ishemia/  ef 42% with lateral and apical hypokinesis  . CYSTOSCOPY WITH BIOPSY N/A 11/20/2017   Procedure: CYSTOSCOPY WITH BIOPSY AND FULGURATION;  Surgeon: [name], MD;  Location: [facility];  Service: Urology;  Laterality: N/A;  . INCISION AND DRAINAGE RIGHT DISTAL MEDIAL THIGH HEMATOMA  04-14-2005   dr Ninfa Linden  . left arm skin graft  1976   injury  . LEFT HEART CATHETERIZATION WITH CORONARY ANGIOGRAM Bilateral 12/26/2013   Procedure: LEFT HEART CATHETERIZATION WITH CORONARY ANGIOGRAM;  Surgeon: Burnell Blanks, MD;  Location: [facility] CATH LAB;  Service: Cardiovascular;  Laterality:  Bilateral;  STEMI, in setting cocaine/ alcohol :  mid disease in the RCA (20%), moderate disease in the small caliber intermediate branch (distal 50-60%), normal LVSF (ef 55-60%)  . TRANSTHORACIC ECHOCARDIOGRAM  08/05/2017   moderate LVH,  ef 55%,  grade 1 diastolic dysfunction/  mild LAE/  trivial Tr    ROS: Review of Systems Negative except as stated above PHYSICAL EXAM: BP 140/70 (BP Location: Right Arm, Patient Position: Sitting, Cuff Size: Large)   Pulse 65   Temp 97.9 F (36.6 C) (Oral)   Resp 16   SpO2 98%   Wt Readings from Last 3 Encounters:  11/20/17 275 lb 11.2 oz (125.1 kg)  11/06/17 275 lb 12.8 oz (125.1 kg)  10/08/17 273 lb 3.2 oz (123.9 kg)    Physical Exam General appearance - alert, well appearing, and in no distress Mental status - normal mood, behavior, speech, dress, motor activity, and thought processes Chest - clear to auscultation, no wheezes, rales or rhonchi, symmetric air entry Heart - normal rate, regular rhythm, normal S1, S2, no murmurs, rubs, clicks or gallops Extremities -bilateral lower extremity edema from below the knee down.  Left lower leg about 1-1/2 times the size of the right. Skin -left leg: Rough but fine, hyperpigmented rash around the entire circumference of the left lower leg a few centimeters above the ankle.  Purpura type feel to the rash  ASSESSMENT AND PLAN: 1. Left leg swelling -In reviewing previous notes it seems that the left lower extremity is always been a bit larger than the right but it is more pronounced today.  We will get a Doppler ultrasound. -Increase furosemide to 40 mg daily. - VAS [name] LOWER EXTREMITY VENOUS (DVT); Future  2. Rash, skin Questionable etiology.  Will give a  trial of several days of oral steroid.  Patient advised that he may have increased blood sugar for the 4 days that he is taking it - Comprehensive metabolic panel - CBC - Sedimentation Rate - predniSONE (DELTASONE) 10 MG tablet; Take 1.5 tablets  (15 mg total) by mouth daily with breakfast.  Dispense: 7 tablet; Refill: 0  3. Essential hypertension Close to goal. - Comprehensive metabolic panel - CBC - furosemide (LASIX) 40 MG tablet; Take 1 tablet (40 mg total) by mouth every morning.  Dispense: 30 tablet; Refill: 6  4. Vasculogenic erectile dysfunction, unspecified vasculogenic erectile dysfunction type He can get the sildenafil through outpatient assistance program. Pt advised of possible side effects of this medication including prolong erection, flushing, headaches, stuff nose and sudden vision and hearing changes.  Pt told to be seen in ER if he has erection lasting longer than 3-4 hrs, or if he has sudden vision changes or hearing loss. - sildenafil (VIAGRA) 50 MG tablet; 1-2 tabs 1 hr prior to intercourse PRN.  Limit use to 2 tabs/24 hrs  Dispense: 30 tablet; Refill: 3  5. Class 2 severe obesity due to excess calories with serious comorbidity and body mass index (BMI) of 35.0 to 35.9 in adult [facility]) Not a good candidate for diet pills given his comorbidities including diastolic CHF.  Dietary counseling given.  I recommend referral to a nutritionist but patient declines.  States that he has seen a nutritionist in the past  Patient was given the opportunity to ask questions.  Patient verbalized understanding of the plan and was able to repeat key elements of the plan.   Orders Placed This Encounter  Procedures  . Comprehensive metabolic panel  . CBC  . Sedimentation Rate     Requested Prescriptions   Signed Prescriptions Disp Refills  . sildenafil (VIAGRA) 50 MG tablet 30 tablet 3    Sig: 1-2 tabs 1 hr prior to intercourse PRN.  Limit use to 2 tabs/24 hrs  . furosemide (LASIX) 40 MG tablet 30 tablet 6    Sig: Take 1 tablet (40 mg total) by mouth every morning.  . predniSONE (DELTASONE) 10 MG tablet 7 tablet 0    Sig: Take 1.5 tablets (15 mg total) by mouth daily with breakfast.  . Blood Glucose Monitoring Suppl (ONE  TOUCH ULTRA 2) w/Device KIT 1 each 0    Sig: Use as directed  . glucose blood (ONE TOUCH ULTRA TEST) test strip 100 each 12    Sig: Use as instructed  . Lancets (ONETOUCH ULTRASOFT) lancets 100 each 12    Sig: Use as instructed  . Lancets (ONETOUCH ULTRASOFT) lancets 100 each 12    Sig: Use as instructed    Return in about 3 weeks (around 01/08/2018).  Karle Plumber, MD, FACP

## 2017-12-19 ENCOUNTER — Other Ambulatory Visit: Payer: Self-pay

## 2017-12-19 ENCOUNTER — Ambulatory Visit (HOSPITAL_COMMUNITY)
Admission: RE | Admit: 2017-12-19 | Discharge: 2017-12-19 | Disposition: A | Discharge status: Home / Self Care | Payer: Medicare Other | Source: Ambulatory Visit | Attending: Internal Medicine | Admitting: Internal Medicine

## 2017-12-19 DIAGNOSIS — M7989 Other specified soft tissue disorders: Secondary | ICD-10-CM | POA: Diagnosis not present

## 2017-12-19 LAB — COMPREHENSIVE METABOLIC PANEL
A/G RATIO: 1.3 (ref 1.2–2.2)
ALT: 14 IU/L (ref 0–44)
AST: 18 IU/L (ref 0–40)
Albumin: 4.2 g/dL (ref 3.5–5.5)
Alkaline Phosphatase: 72 IU/L (ref 39–117)
BUN / CREAT RATIO: 23 — AB (ref 9–20)
BUN: 30 mg/dL — ABNORMAL HIGH (ref 6–24)
Bilirubin Total: 0.3 mg/dL (ref 0.0–1.2)
CALCIUM: 9.4 mg/dL (ref 8.7–10.2)
CO2: 20 mmol/L (ref 20–29)
Chloride: 104 mmol/L (ref 96–106)
Creatinine, Ser: 1.33 mg/dL — ABNORMAL HIGH (ref 0.76–1.27)
GFR calc Af Amer: 67 mL/min/{1.73_m2} (ref >59)
GFR, EST NON AFRICAN AMERICAN: 58 mL/min/{1.73_m2} — AB (ref >59)
GLOBULIN, TOTAL: 3.2 g/dL (ref 1.5–4.5)
Glucose: 74 mg/dL (ref 65–99)
POTASSIUM: 4.4 mmol/L (ref 3.5–5.2)
SODIUM: 141 mmol/L (ref 134–144)
TOTAL PROTEIN: 7.4 g/dL (ref 6.0–8.5)

## 2017-12-19 LAB — CBC
Hematocrit: 41.5 % (ref 37.5–51.0)
Hemoglobin: 13.3 g/dL (ref 13.0–17.7)
MCH: 27.4 pg (ref 26.6–33.0)
MCHC: 32 g/dL (ref 31.5–35.7)
MCV: 86 fL (ref 79–97)
Platelets: 214 10*3/uL (ref 150–450)
RBC: 4.85 x10E6/uL (ref 4.14–5.80)
RDW: 14 % (ref 12.3–15.4)
WBC: 8.8 10*3/uL (ref 3.4–10.8)

## 2017-12-19 LAB — SEDIMENTATION RATE: Sed Rate: 32 mm/hr — ABNORMAL HIGH (ref 0–30)

## 2017-12-19 MED ORDER — ACCU-CHEK AVIVA DEVI: 0 refills | Status: DC

## 2017-12-19 MED ORDER — ACCU-CHEK SOFTCLIX LANCETS MISC: 12 refills | Status: DC

## 2017-12-19 MED ORDER — GLUCOSE BLOOD VI STRP: ORAL_STRIP | 12 refills | Status: DC

## 2017-12-19 MED ORDER — ACCU-CHEK SOFTCLIX LANCET DEV KIT: PACK | 0 refills | Status: DC

## 2017-12-19 NOTE — Progress Notes (Signed)
Preliminary results by tech - Venous Duplex Lower Ext. Left Completed. Negative for deep vein thrombosis. Oda Cogan, BS, RDMS, RVT

## 2018-01-04 ENCOUNTER — Telehealth: Payer: Self-pay | Admitting: Internal Medicine

## 2018-01-04 NOTE — Telephone Encounter (Signed)
Call placed to patient [phone] to inquire about SCAT application.  and he informed me that he hasn't received a call yet. Informed him that I would fax his application again. Patient was appreciative.   Faxed SCAT application to (fax) # [phone]

## 2018-01-10 DIAGNOSIS — H903 Sensorineural hearing loss, bilateral: Secondary | ICD-10-CM | POA: Diagnosis not present

## 2018-01-16 ENCOUNTER — Encounter: Payer: Self-pay | Admitting: Internal Medicine

## 2018-01-16 NOTE — Progress Notes (Signed)
Receive info from [place]. Pt had hearing test 01/10/2018.  Revealed moderate sensorineural hearing loss. Hearing aid recommended. F/u testing in 2 yrs.

## 2018-01-17 ENCOUNTER — Encounter: Payer: Self-pay | Admitting: Internal Medicine

## 2018-01-17 ENCOUNTER — Ambulatory Visit: Payer: Medicare Other | Attending: Internal Medicine | Admitting: Internal Medicine

## 2018-01-17 VITALS — BP 164/91 | HR 71 | Temp 98.2°F | Resp 16 | Wt 279.4 lb

## 2018-01-17 DIAGNOSIS — Z8249 Family history of ischemic heart disease and other diseases of the circulatory system: Secondary | ICD-10-CM | POA: Insufficient documentation

## 2018-01-17 DIAGNOSIS — Z803 Family history of malignant neoplasm of breast: Secondary | ICD-10-CM | POA: Insufficient documentation

## 2018-01-17 DIAGNOSIS — R600 Localized edema: Secondary | ICD-10-CM | POA: Diagnosis not present

## 2018-01-17 DIAGNOSIS — E118 Type 2 diabetes mellitus with unspecified complications: Secondary | ICD-10-CM

## 2018-01-17 DIAGNOSIS — Z8673 Personal history of transient ischemic attack (TIA), and cerebral infarction without residual deficits: Secondary | ICD-10-CM | POA: Diagnosis not present

## 2018-01-17 DIAGNOSIS — Z09 Encounter for follow-up examination after completed treatment for conditions other than malignant neoplasm: Secondary | ICD-10-CM | POA: Insufficient documentation

## 2018-01-17 DIAGNOSIS — I5031 Acute diastolic (congestive) heart failure: Secondary | ICD-10-CM | POA: Insufficient documentation

## 2018-01-17 DIAGNOSIS — E785 Hyperlipidemia, unspecified: Secondary | ICD-10-CM | POA: Diagnosis not present

## 2018-01-17 DIAGNOSIS — Z888 Allergy status to other drugs, medicaments and biological substances status: Secondary | ICD-10-CM | POA: Diagnosis not present

## 2018-01-17 DIAGNOSIS — F17210 Nicotine dependence, cigarettes, uncomplicated: Secondary | ICD-10-CM | POA: Diagnosis not present

## 2018-01-17 DIAGNOSIS — M48061 Spinal stenosis, lumbar region without neurogenic claudication: Secondary | ICD-10-CM | POA: Diagnosis not present

## 2018-01-17 DIAGNOSIS — Z79899 Other long term (current) drug therapy: Secondary | ICD-10-CM | POA: Diagnosis not present

## 2018-01-17 DIAGNOSIS — I110 Hypertensive heart disease with heart failure: Secondary | ICD-10-CM | POA: Insufficient documentation

## 2018-01-17 DIAGNOSIS — H35033 Hypertensive retinopathy, bilateral: Secondary | ICD-10-CM | POA: Insufficient documentation

## 2018-01-17 DIAGNOSIS — I5030 Unspecified diastolic (congestive) heart failure: Secondary | ICD-10-CM | POA: Diagnosis not present

## 2018-01-17 DIAGNOSIS — Z833 Family history of diabetes mellitus: Secondary | ICD-10-CM | POA: Diagnosis not present

## 2018-01-17 DIAGNOSIS — H903 Sensorineural hearing loss, bilateral: Secondary | ICD-10-CM

## 2018-01-17 DIAGNOSIS — I10 Essential (primary) hypertension: Secondary | ICD-10-CM | POA: Diagnosis not present

## 2018-01-17 DIAGNOSIS — R21 Rash and other nonspecific skin eruption: Secondary | ICD-10-CM | POA: Diagnosis not present

## 2018-01-17 DIAGNOSIS — Z7952 Long term (current) use of systemic steroids: Secondary | ICD-10-CM | POA: Diagnosis not present

## 2018-01-17 DIAGNOSIS — H905 Unspecified sensorineural hearing loss: Secondary | ICD-10-CM | POA: Diagnosis not present

## 2018-01-17 DIAGNOSIS — Z9889 Other specified postprocedural states: Secondary | ICD-10-CM | POA: Diagnosis not present

## 2018-01-17 DIAGNOSIS — Z7984 Long term (current) use of oral hypoglycemic drugs: Secondary | ICD-10-CM | POA: Insufficient documentation

## 2018-01-17 DIAGNOSIS — Z823 Family history of stroke: Secondary | ICD-10-CM | POA: Insufficient documentation

## 2018-01-17 DIAGNOSIS — E1142 Type 2 diabetes mellitus with diabetic polyneuropathy: Secondary | ICD-10-CM | POA: Diagnosis not present

## 2018-01-17 DIAGNOSIS — Z7982 Long term (current) use of aspirin: Secondary | ICD-10-CM | POA: Diagnosis not present

## 2018-01-17 LAB — GLUCOSE, POCT (MANUAL RESULT ENTRY): POC Glucose: 122 mg/dl — AB (ref 70–99)

## 2018-01-17 MED ORDER — TRUEPLUS LANCETS 28G MISC: 6 refills | Status: DC

## 2018-01-17 MED ORDER — TRIAMCINOLONE ACETONIDE 0.1 % EX CREA: 1.0000 "application " | TOPICAL_CREAM | Freq: Two times a day (BID) | CUTANEOUS | 0 refills | Status: DC

## 2018-01-17 MED ORDER — GLUCOSE BLOOD VI STRP: ORAL_STRIP | 12 refills | Status: DC

## 2018-01-17 MED ORDER — TRUE METRIX METER W/DEVICE KIT: PACK | 0 refills | Status: DC

## 2018-01-17 NOTE — Progress Notes (Signed)
Patient ID: [name], male    DOB: 1961/07/02  MRN: [id number]  CC: Follow-up (3 weeks)   Subjective: [name] is a 57 y.o. male who presents for chronic ds management and 3 wk f/u His concerns today include:  57 year old male with history ofDM2, HTN, dCHF,tob dep, HL, CVA (about 3 yrs ago per pt), spinal stenosis of lumbar spine,chronic lower extremity edema   ED:  Got the Viagra but has not tried it as yet.   Rash LT lower leg: much better Has itching on hands and upper arms in the evenings and at nights.  Had bed bugs in past but had his dwelling treated.  He has not seen any bed bugs lately  LE edema:  Much better and decreased.  Lasix was increased to 40 mg on last visit.  [name] was neg for DVT LT leg  HTN/CHF:  Did not take meds as yet for today.  "I don't like taking pills."  Wgh up 5 lbs since steroid given on last visit  DM:  His insurance would not pay for meter sent to his pharmacy on last visit.  Would like to get one at our pharmacy.  Will pay out of pocket.  Trying to do better with eating habits.  Most of his meats are baked or broiled.  He does most of his own cooking.  Hearing Loss:  Recent hearing test revealed moderate SN hearing loss.  Medicare does not pay for hearing aid and state program will not cover unless hearing loss more severe.  Tob dep:  Down to 3 cig/day.  Trying to quit.  Patient Active Problem List   Diagnosis Date Noted  . Sensorineural hearing loss of both ears 12/04/2017  . Acute diastolic congestive heart failure ([place])   . Edema of both legs 05/21/2017  . Mild nonproliferative diabetic retinopathy ([place]) 01/10/2016  . Hypertensive retinopathy of both eyes 01/10/2016  . Spinal stenosis of lumbar region 07/15/2015  . Abnormality of gait 06/07/2015  . Hydrocephalus 06/07/2015  . Peripheral neuropathy 04/26/2015  . Constipation 04/26/2015  . Diabetes mellitus type 2 with complications ([place]) 53/29/9242  . Decreased hearing of  both ears 03/15/2015  . Tinea pedis 03/15/2015  . Callus of foot 05/22/2014  . Contusion of cervical cord ([place]) 05/22/2014  . Coronary vasospasm, cocaine related 12/29/2013  . Cocaine abuse ([place]) 12/28/2013  . Alcoholism /alcohol abuse ([place]) 12/28/2013  . Left-sided weakness 12/26/2013  . Hyperlipidemia associated with type 2 diabetes mellitus ([place]) 04/01/2007  . OBESITY NOS 03/28/2007  . Essential hypertension 03/28/2007     Current Outpatient Medications on File Prior to Visit  Medication Sig Dispense Refill  . ACCU-CHEK SOFTCLIX LANCETS lancets Use as instructed 100 each 12  . aspirin EC 81 MG tablet Take 1 tablet (81 mg total) by mouth daily. 100 tablet 0  . atorvastatin (LIPITOR) 40 MG tablet Take 1 tablet (40 mg total) by mouth every morning. 30 tablet 2  . Blood Glucose Monitoring Suppl (ACCU-CHEK AVIVA) device Use as instructed 1 each 0  . Blood Glucose Monitoring Suppl (ONE TOUCH ULTRA 2) w/Device KIT Use as directed 1 each 0  . Blood Glucose Monitoring Suppl (TRUE METRIX METER) w/Device KIT Use as directed (Patient not taking: Reported on 12/18/2017) 1 kit 0  . cetirizine (ZYRTEC) 10 MG tablet Take 1 tablet (10 mg total) by mouth daily. (Patient taking differently: Take 10 mg by mouth daily as needed. ) 30 tablet 11  .  clotrimazole (LOTRIMIN) 1 % cream Apply to groin area 2 times daily 15 g 0  . clotrimazole-betamethasone (LOTRISONE) cream APPLY 1 APPLICATION TWICE A DAY TO THE GROIN FOR THREE WEEKS 30 g 1  . furosemide (LASIX) 40 MG tablet Take 1 tablet (40 mg total) by mouth every morning. 30 tablet 6  . gabapentin (NEURONTIN) 300 MG capsule TAKE 1 CAPSULE (300 MG TOTAL) BY MOUTH THREE TIMES DAILY. 270 capsule 3  . glipiZIDE (GLIPIZIDE XL) 10 MG 24 hr tablet Take 1 tablet (10 mg total) by mouth daily with breakfast. 90 tablet 0  . glucose blood (ACCU-CHEK AVIVA) test strip Use as instructed 100 each 12  . glucose blood (ONE TOUCH ULTRA TEST) test strip Use as instructed 100  each 12  . glucose blood (TRUE METRIX BLOOD GLUCOSE TEST) test strip Check blood sugars 1-2 times daily 100 each 12  . IBU 800 MG tablet Take 800 mg by mouth as needed for pain.    Marland Kitchen labetalol (NORMODYNE) 100 MG tablet Take 0.5 tablets (50 mg total) by mouth 2 (two) times daily. (Patient taking differently: Take 50 mg by mouth 2 (two) times daily. ) 30 tablet 5  . Lancets (ONETOUCH ULTRASOFT) lancets Use as instructed 100 each 12  . Lancets (ONETOUCH ULTRASOFT) lancets Use as instructed 100 each 12  . Lancets Misc. (ACCU-CHEK SOFTCLIX LANCET DEV) KIT Use as directed 1 kit 0  . lisinopril (PRINIVIL,ZESTRIL) 40 MG tablet Take 1 tablet (40 mg total) by mouth daily. (Patient taking differently: Take 40 mg by mouth every morning. ) 90 tablet 3  . metFORMIN (GLUCOPHAGE) 1000 MG tablet Take 1 tablet (1,000 mg total) by mouth 2 (two) times daily with a meal. 180 tablet 0  . predniSONE (DELTASONE) 10 MG tablet Take 1.5 tablets (15 mg total) by mouth daily with breakfast. 7 tablet 0  . sildenafil (VIAGRA) 50 MG tablet 1-2 tabs 1 hr prior to intercourse PRN.  Limit use to 2 tabs/24 hrs 30 tablet 3  . triamcinolone cream (KENALOG) 0.1 % Apply 1 application topically 2 (two) times daily. 30 g 0  . TRUEPLUS LANCETS 28G MISC Use as directed 100 each 6   No current facility-administered medications on file prior to visit.     Allergies  Allergen Reactions  . Amlodipine Swelling    Lower extremity swelling    Social History   Socioeconomic History  . Marital status: Divorced    Spouse name: Not on file  . Number of children: 0  . Years of education: 10  . Highest education level: Not on file  Occupational History  . Occupation: Unemployed  Social Needs  . Financial resource strain: Not on file  . Food insecurity:    Worry: Not on file    Inability: Not on file  . Transportation needs:    Medical: Not on file    Non-medical: Not on file  Tobacco Use  . Smoking status: Current Every Day Smoker     Years: 45.00    Types: Cigarettes  . Smokeless tobacco: Never Used  . Tobacco comment: 11-15-2017  per pt 1pp3days  Substance and Sexual Activity  . Alcohol use: Not Currently    Alcohol/week: 0.0 oz    Comment: 11-15-2017  per pt last alcohol 12/ 2018  alcohol abuse  . Drug use: Yes    Types: "Crack" cocaine, Cocaine    Comment: 11-15-2017  per pt last used March 2019  . Sexual activity: Not on file  Lifestyle  .  Physical activity:    Days per week: Not on file    Minutes per session: Not on file  . Stress: Not on file  Relationships  . Social connections:    Talks on phone: Not on file    Gets together: Not on file    Attends religious service: Not on file    Active member of club or organization: Not on file    Attends meetings of clubs or organizations: Not on file    Relationship status: Not on file  . Intimate partner violence:    Fear of current or ex partner: Not on file    Emotionally abused: Not on file    Physically abused: Not on file    Forced sexual activity: Not on file  Other Topics Concern  . Not on file  Social History Narrative   Lives at home alone.   Right-handed.   Occasional use.    Family History  Problem Relation Age of Onset  . Hypertension Mother   . Diabetes Mother   . Cancer Mother        breast cancer   . Hypertension Father   . Heart disease Father   . Stroke Father   . Hypertension Sister     Past Surgical History:  Procedure Laterality Date  . CARDIAC CATHETERIZATION  12/01/2008   dr hochrein   abnormal stress myoview:  mild coronary plaque, normal LVF  . CARDIAC CATHETERIZATION  04/29/2011   dr [place]   in setting ECG with new ST elevation & severe hypertensive:  nonobstructive atherosclerotic CAD, normal LVF (30% mRCA)   . CARDIOVASCULAR STRESS TEST  08/15/2010   Low nuclear study w/ no evidence ishemia/  ef 42% with lateral and apical hypokinesis  . CYSTOSCOPY WITH BIOPSY N/A 11/20/2017   Procedure: CYSTOSCOPY WITH  BIOPSY AND FULGURATION;  Surgeon: [name], MD;  Location: [facility];  Service: Urology;  Laterality: N/A;  . INCISION AND DRAINAGE RIGHT DISTAL MEDIAL THIGH HEMATOMA  04-14-2005   dr Ninfa Linden  . left arm skin graft  1976   injury  . LEFT HEART CATHETERIZATION WITH CORONARY ANGIOGRAM Bilateral 12/26/2013   Procedure: LEFT HEART CATHETERIZATION WITH CORONARY ANGIOGRAM;  Surgeon: Burnell Blanks, MD;  Location: [facility] CATH LAB;  Service: Cardiovascular;  Laterality: Bilateral;  STEMI, in setting cocaine/ alcohol :  mid disease in the RCA (20%), moderate disease in the small caliber intermediate branch (distal 50-60%), normal LVSF (ef 55-60%)  . TRANSTHORACIC ECHOCARDIOGRAM  08/05/2017   moderate LVH,  ef 55%,  grade 1 diastolic dysfunction/  mild LAE/  trivial Tr    ROS: Review of Systems Negative except as above  PHYSICAL EXAM: BP (!) 164/91   Pulse 71   Temp 98.2 F (36.8 C) (Oral)   Resp 16   Wt 279 lb 6.4 oz (126.7 kg)   SpO2 98%   BMI 34.92 kg/m   Wt Readings from Last 3 Encounters:  01/17/18 279 lb 6.4 oz (126.7 kg)  11/20/17 275 lb 11.2 oz (125.1 kg)  11/06/17 275 lb 12.8 oz (125.1 kg)    Physical Exam  General appearance - alert, well appearing, and in no distress Mental status - normal mood, behavior, speech, dress, motor activity, and thought processes Neck - supple, no significant adenopathy Chest - clear to auscultation, no wheezes, rales or rhonchi, symmetric air entry Heart - normal rate, regular rhythm, normal S1, S2, no murmurs, rubs, clicks or gallops Extremities - trace LE edema.  LT LE>RT in size Skin - few fine bumps on dorsum RT hand Rash on LLE has resolved.  Dry skin above ankle remains  Results for orders placed or performed in visit on 01/17/18  POCT glucose (manual entry)  Result Value Ref Range   POC Glucose 122 (A) 70 - 99 mg/dl    ASSESSMENT AND PLAN: 1. Essential hypertension Not at goal.  Advised to take medications as  soon as he returns home today.  2. Diastolic congestive heart failure, unspecified HF chronicity ([place]) Compensated.  Stressed the importance of good blood pressure control and compliance with furosemide.  Continue to limit salt in the foods.  3. Type 2 diabetes mellitus with complication, without long-term current use of insulin (HCC) Continue metformin. Continue healthy eating habits. - POCT glucose (manual entry) - Blood Glucose Monitoring Suppl (TRUE METRIX METER) w/Device KIT; Use as directed  Dispense: 1 kit; Refill: 0 - glucose blood (TRUE METRIX BLOOD GLUCOSE TEST) test strip; Check blood sugars 1-2 times daily  Dispense: 100 each; Refill: 12 - TRUEPLUS LANCETS 28G MISC; Use as directed  Dispense: 100 each; Refill: 6  4. Rash, skin Advised patient to check his bedding in the middle of the night early morning hours for bedbugs.  His history suggests that he may have bedbugs again.  Triamcinolone cream given to use as needed  5. Sensorineural hearing loss (SNHL) of both ears Unable to afford hearing aids.   Patient was given the opportunity to ask questions.  Patient verbalized understanding of the plan and was able to repeat key elements of the plan.   Orders Placed This Encounter  Procedures  . POCT glucose (manual entry)     Requested Prescriptions    No prescriptions requested or ordered in this encounter    No follow-ups on file.  Karle Plumber, MD, FACP

## 2018-01-17 NOTE — Patient Instructions (Addendum)
Take your blood pressure medications every day as prescribed.

## 2018-01-25 ENCOUNTER — Telehealth: Payer: Self-pay | Admitting: Internal Medicine

## 2018-01-25 NOTE — Telephone Encounter (Signed)
Call placed to patient regarding [employer] application. Spoke with patient and he stated that he still hasn't received a call or letter from [place] regarding his application. Explained to patient that his application was faxed twice on 5/28 and then on 6/14. Informed him that I would fax it one last time. Patient understood and was appreciative.   Re faxed patient's application again to [employer] [phone].

## 2018-02-06 ENCOUNTER — Other Ambulatory Visit: Payer: Self-pay

## 2018-02-06 DIAGNOSIS — E118 Type 2 diabetes mellitus with unspecified complications: Secondary | ICD-10-CM

## 2018-02-06 DIAGNOSIS — B356 Tinea cruris: Secondary | ICD-10-CM

## 2018-02-06 MED ORDER — METFORMIN HCL 1000 MG PO TABS: 1000.0000 mg | ORAL_TABLET | Freq: Two times a day (BID) | ORAL | 2 refills | Status: DC

## 2018-02-06 MED ORDER — CLOTRIMAZOLE-BETAMETHASONE 1-0.05 % EX CREA: TOPICAL_CREAM | CUTANEOUS | 0 refills | Status: DC

## 2018-02-06 MED ORDER — ATORVASTATIN CALCIUM 40 MG PO TABS: 40.0000 mg | ORAL_TABLET | Freq: Every morning | ORAL | 2 refills | Status: DC

## 2018-02-06 MED ORDER — GLIPIZIDE ER 10 MG PO TB24: 10.0000 mg | ORAL_TABLET | Freq: Every day | ORAL | 2 refills | Status: DC

## 2018-02-18 DIAGNOSIS — Z79899 Other long term (current) drug therapy: Secondary | ICD-10-CM | POA: Diagnosis not present

## 2018-03-04 ENCOUNTER — Other Ambulatory Visit: Payer: Self-pay

## 2018-03-04 ENCOUNTER — Encounter (HOSPITAL_COMMUNITY): Payer: Self-pay | Admitting: Emergency Medicine

## 2018-03-04 ENCOUNTER — Observation Stay (HOSPITAL_COMMUNITY)
Admission: EM | Admit: 2018-03-04 | Discharge: 2018-03-06 | Disposition: A | Discharge status: Home / Self Care | Payer: Medicare Other | Attending: Internal Medicine | Admitting: Internal Medicine

## 2018-03-04 ENCOUNTER — Emergency Department (HOSPITAL_COMMUNITY): Payer: Medicare Other

## 2018-03-04 DIAGNOSIS — G459 Transient cerebral ischemic attack, unspecified: Secondary | ICD-10-CM | POA: Diagnosis not present

## 2018-03-04 DIAGNOSIS — I5031 Acute diastolic (congestive) heart failure: Secondary | ICD-10-CM | POA: Insufficient documentation

## 2018-03-04 DIAGNOSIS — F1010 Alcohol abuse, uncomplicated: Secondary | ICD-10-CM | POA: Diagnosis not present

## 2018-03-04 DIAGNOSIS — R531 Weakness: Secondary | ICD-10-CM

## 2018-03-04 DIAGNOSIS — R4781 Slurred speech: Secondary | ICD-10-CM | POA: Diagnosis not present

## 2018-03-04 DIAGNOSIS — E1169 Type 2 diabetes mellitus with other specified complication: Secondary | ICD-10-CM | POA: Diagnosis present

## 2018-03-04 DIAGNOSIS — Z79899 Other long term (current) drug therapy: Secondary | ICD-10-CM | POA: Insufficient documentation

## 2018-03-04 DIAGNOSIS — E119 Type 2 diabetes mellitus without complications: Secondary | ICD-10-CM | POA: Diagnosis present

## 2018-03-04 DIAGNOSIS — I10 Essential (primary) hypertension: Secondary | ICD-10-CM | POA: Diagnosis present

## 2018-03-04 DIAGNOSIS — F449 Dissociative and conversion disorder, unspecified: Secondary | ICD-10-CM

## 2018-03-04 DIAGNOSIS — Z7984 Long term (current) use of oral hypoglycemic drugs: Secondary | ICD-10-CM | POA: Diagnosis not present

## 2018-03-04 DIAGNOSIS — E1140 Type 2 diabetes mellitus with diabetic neuropathy, unspecified: Secondary | ICD-10-CM | POA: Insufficient documentation

## 2018-03-04 DIAGNOSIS — F1410 Cocaine abuse, uncomplicated: Secondary | ICD-10-CM | POA: Diagnosis not present

## 2018-03-04 DIAGNOSIS — H9190 Unspecified hearing loss, unspecified ear: Secondary | ICD-10-CM | POA: Diagnosis not present

## 2018-03-04 DIAGNOSIS — E785 Hyperlipidemia, unspecified: Secondary | ICD-10-CM | POA: Insufficient documentation

## 2018-03-04 DIAGNOSIS — R1110 Vomiting, unspecified: Secondary | ICD-10-CM | POA: Diagnosis not present

## 2018-03-04 DIAGNOSIS — E118 Type 2 diabetes mellitus with unspecified complications: Secondary | ICD-10-CM

## 2018-03-04 DIAGNOSIS — H9193 Unspecified hearing loss, bilateral: Secondary | ICD-10-CM | POA: Diagnosis present

## 2018-03-04 DIAGNOSIS — G9389 Other specified disorders of brain: Secondary | ICD-10-CM | POA: Diagnosis not present

## 2018-03-04 DIAGNOSIS — F17210 Nicotine dependence, cigarettes, uncomplicated: Secondary | ICD-10-CM | POA: Insufficient documentation

## 2018-03-04 DIAGNOSIS — F1020 Alcohol dependence, uncomplicated: Secondary | ICD-10-CM | POA: Diagnosis present

## 2018-03-04 DIAGNOSIS — IMO0001 Reserved for inherently not codable concepts without codable children: Secondary | ICD-10-CM | POA: Diagnosis present

## 2018-03-04 DIAGNOSIS — I110 Hypertensive heart disease with heart failure: Secondary | ICD-10-CM | POA: Insufficient documentation

## 2018-03-04 DIAGNOSIS — R251 Tremor, unspecified: Secondary | ICD-10-CM | POA: Diagnosis not present

## 2018-03-04 DIAGNOSIS — I499 Cardiac arrhythmia, unspecified: Secondary | ICD-10-CM | POA: Diagnosis not present

## 2018-03-04 DIAGNOSIS — R600 Localized edema: Secondary | ICD-10-CM | POA: Diagnosis present

## 2018-03-04 LAB — BASIC METABOLIC PANEL
Anion gap: 8 (ref 5–15)
BUN: 29 mg/dL — ABNORMAL HIGH (ref 6–20)
CO2: 25 mmol/L (ref 22–32)
Calcium: 9.2 mg/dL (ref 8.9–10.3)
Chloride: 109 mmol/L (ref 98–111)
Creatinine, Ser: 1.53 mg/dL — ABNORMAL HIGH (ref 0.61–1.24)
GFR calc Af Amer: 56 mL/min — ABNORMAL LOW (ref >60)
GFR calc non Af Amer: 48 mL/min — ABNORMAL LOW (ref >60)
Glucose, Bld: 161 mg/dL — ABNORMAL HIGH (ref 70–99)
Potassium: 4.6 mmol/L (ref 3.5–5.1)
Sodium: 142 mmol/L (ref 135–145)

## 2018-03-04 LAB — CBC
HCT: 42 % (ref 39.0–52.0)
Hemoglobin: 13.2 g/dL (ref 13.0–17.0)
MCH: 27.6 pg (ref 26.0–34.0)
MCHC: 31.4 g/dL (ref 30.0–36.0)
MCV: 87.9 fL (ref 78.0–100.0)
Platelets: 211 10*3/uL (ref 150–400)
RBC: 4.78 MIL/uL (ref 4.22–5.81)
RDW: 13.3 % (ref 11.5–15.5)
WBC: 7.9 10*3/uL (ref 4.0–10.5)

## 2018-03-04 LAB — I-STAT TROPONIN, ED: Troponin i, poc: 0.01 ng/mL (ref 0.00–0.08)

## 2018-03-04 NOTE — ED Triage Notes (Signed)
Per EMS, pt called ems d/t rt arm and leg feel weird. Denies pain. No neuro sx. Pt's sx started at [facility] today. Hx of back injury that has affected his left side. Pt ambulatory with cane. Hx of HTN and diabetes. Pt non-compliant with Lasix, increased ascites noted to abd. EMS 12 lead infrequent PVCs and PACs.

## 2018-03-05 ENCOUNTER — Emergency Department (HOSPITAL_COMMUNITY): Payer: Medicare Other

## 2018-03-05 ENCOUNTER — Ambulatory Visit (HOSPITAL_BASED_OUTPATIENT_CLINIC_OR_DEPARTMENT_OTHER): Payer: Medicare Other

## 2018-03-05 ENCOUNTER — Other Ambulatory Visit: Payer: Self-pay

## 2018-03-05 ENCOUNTER — Observation Stay (HOSPITAL_COMMUNITY): Payer: Medicare Other

## 2018-03-05 ENCOUNTER — Encounter (HOSPITAL_COMMUNITY): Payer: Self-pay | Admitting: Emergency Medicine

## 2018-03-05 ENCOUNTER — Observation Stay (HOSPITAL_BASED_OUTPATIENT_CLINIC_OR_DEPARTMENT_OTHER): Payer: Medicare Other

## 2018-03-05 DIAGNOSIS — I10 Essential (primary) hypertension: Secondary | ICD-10-CM

## 2018-03-05 DIAGNOSIS — M48062 Spinal stenosis, lumbar region with neurogenic claudication: Secondary | ICD-10-CM

## 2018-03-05 DIAGNOSIS — R2990 Unspecified symptoms and signs involving the nervous system: Secondary | ICD-10-CM

## 2018-03-05 DIAGNOSIS — R4781 Slurred speech: Secondary | ICD-10-CM | POA: Diagnosis not present

## 2018-03-05 DIAGNOSIS — G459 Transient cerebral ischemic attack, unspecified: Secondary | ICD-10-CM | POA: Diagnosis not present

## 2018-03-05 DIAGNOSIS — I639 Cerebral infarction, unspecified: Secondary | ICD-10-CM | POA: Diagnosis not present

## 2018-03-05 DIAGNOSIS — E118 Type 2 diabetes mellitus with unspecified complications: Secondary | ICD-10-CM | POA: Diagnosis not present

## 2018-03-05 LAB — COMPREHENSIVE METABOLIC PANEL
ALK PHOS: 58 U/L (ref 38–126)
ALT: 17 U/L (ref 0–44)
AST: 18 U/L (ref 15–41)
Albumin: 3.8 g/dL (ref 3.5–5.0)
Anion gap: 8 (ref 5–15)
BUN: 23 mg/dL — AB (ref 6–20)
CALCIUM: 9.5 mg/dL (ref 8.9–10.3)
CHLORIDE: 108 mmol/L (ref 98–111)
CO2: 25 mmol/L (ref 22–32)
Creatinine, Ser: 1.19 mg/dL (ref 0.61–1.24)
GFR calc Af Amer: >60 mL/min (ref >60)
GFR calc non Af Amer: >60 mL/min (ref >60)
Glucose, Bld: 148 mg/dL — ABNORMAL HIGH (ref 70–99)
Potassium: 4.8 mmol/L (ref 3.5–5.1)
SODIUM: 141 mmol/L (ref 135–145)
Total Bilirubin: 0.7 mg/dL (ref 0.3–1.2)
Total Protein: 7.1 g/dL (ref 6.5–8.1)

## 2018-03-05 LAB — GLUCOSE, CAPILLARY
GLUCOSE-CAPILLARY: 154 mg/dL — AB (ref 70–99)
GLUCOSE-CAPILLARY: 193 mg/dL — AB (ref 70–99)
GLUCOSE-CAPILLARY: 143 mg/dL — AB (ref 70–99)
Glucose-Capillary: 122 mg/dL — ABNORMAL HIGH (ref 70–99)

## 2018-03-05 LAB — CBC
HCT: 42.3 % (ref 39.0–52.0)
HEMOGLOBIN: 13.5 g/dL (ref 13.0–17.0)
MCH: 27.8 pg (ref 26.0–34.0)
MCHC: 31.9 g/dL (ref 30.0–36.0)
MCV: 87 fL (ref 78.0–100.0)
PLATELETS: 196 10*3/uL (ref 150–400)
RBC: 4.86 MIL/uL (ref 4.22–5.81)
RDW: 13.2 % (ref 11.5–15.5)
WBC: 7.1 10*3/uL (ref 4.0–10.5)

## 2018-03-05 LAB — ECHOCARDIOGRAM COMPLETE
HEIGHTINCHES: 75 in
Weight: 4320 oz

## 2018-03-05 LAB — LIPID PANEL
CHOL/HDL RATIO: 3.4 ratio
CHOLESTEROL: 164 mg/dL (ref 0–200)
HDL: 48 mg/dL (ref >40)
LDL Cholesterol: 98 mg/dL (ref 0–99)
Triglycerides: 91 mg/dL (ref <150)
VLDL: 18 mg/dL (ref 0–40)

## 2018-03-05 LAB — RAPID URINE DRUG SCREEN, HOSP PERFORMED
Amphetamines: NOT DETECTED
BARBITURATES: NOT DETECTED
Benzodiazepines: NOT DETECTED
Cocaine: NOT DETECTED
Opiates: NOT DETECTED
Tetrahydrocannabinol: NOT DETECTED

## 2018-03-05 LAB — TROPONIN I
TROPONIN I: 0.03 ng/mL — AB (ref <0.03)
Troponin I: 0.03 ng/mL (ref <0.03)
Troponin I: 0.03 ng/mL (ref <0.03)

## 2018-03-05 LAB — HEMOGLOBIN A1C
HEMOGLOBIN A1C: 7 % — AB (ref 4.8–5.6)
MEAN PLASMA GLUCOSE: 154.2 mg/dL

## 2018-03-05 MED ORDER — ASPIRIN EC 81 MG PO TBEC
81.0000 mg | DELAYED_RELEASE_TABLET | Freq: Every day | ORAL | Status: DC
  Administered 2018-03-06: 81 mg via ORAL
  Filled 2018-03-05: qty 1

## 2018-03-05 MED ORDER — ACETAMINOPHEN 325 MG PO TABS
650.0000 mg | ORAL_TABLET | ORAL | Status: DC | PRN
  Administered 2018-03-05: 650 mg via ORAL
  Filled 2018-03-05: qty 2

## 2018-03-05 MED ORDER — ACETAMINOPHEN 160 MG/5ML PO SOLN: 650.0000 mg | ORAL | Status: DC | PRN

## 2018-03-05 MED ORDER — FUROSEMIDE 40 MG PO TABS
40.0000 mg | ORAL_TABLET | Freq: Every day | ORAL | Status: DC
  Administered 2018-03-06: 40 mg via ORAL
  Filled 2018-03-05: qty 1

## 2018-03-05 MED ORDER — LISINOPRIL 40 MG PO TABS
40.0000 mg | ORAL_TABLET | Freq: Every day | ORAL | Status: DC
  Filled 2018-03-05: qty 1

## 2018-03-05 MED ORDER — GABAPENTIN 300 MG PO CAPS
300.0000 mg | ORAL_CAPSULE | Freq: Three times a day (TID) | ORAL | Status: DC
  Administered 2018-03-05 – 2018-03-06 (×4): 300 mg via ORAL
  Filled 2018-03-05 (×4): qty 1

## 2018-03-05 MED ORDER — LORAZEPAM 2 MG/ML IJ SOLN
1.0000 mg | Freq: Once | INTRAMUSCULAR | Status: AC
  Administered 2018-03-05: 1 mg via INTRAVENOUS
  Filled 2018-03-05: qty 1

## 2018-03-05 MED ORDER — ASPIRIN 325 MG PO TABS
325.0000 mg | ORAL_TABLET | Freq: Every day | ORAL | Status: DC
  Administered 2018-03-05: 325 mg via ORAL
  Filled 2018-03-05: qty 1

## 2018-03-05 MED ORDER — ENOXAPARIN SODIUM 40 MG/0.4ML ~~LOC~~ SOLN
40.0000 mg | SUBCUTANEOUS | Status: DC
  Administered 2018-03-05: 40 mg via SUBCUTANEOUS
  Filled 2018-03-05: qty 0.4

## 2018-03-05 MED ORDER — LABETALOL HCL 100 MG PO TABS
50.0000 mg | ORAL_TABLET | Freq: Two times a day (BID) | ORAL | Status: DC
  Administered 2018-03-05 – 2018-03-06 (×3): 50 mg via ORAL
  Filled 2018-03-05 (×3): qty 1

## 2018-03-05 MED ORDER — INSULIN ASPART 100 UNIT/ML ~~LOC~~ SOLN
0.0000 [IU] | Freq: Three times a day (TID) | SUBCUTANEOUS | Status: DC
  Administered 2018-03-05: 2 [IU] via SUBCUTANEOUS
  Administered 2018-03-06: 1 [IU] via SUBCUTANEOUS
  Administered 2018-03-06: 2 [IU] via SUBCUTANEOUS

## 2018-03-05 MED ORDER — HYDRALAZINE HCL 20 MG/ML IJ SOLN: 10.0000 mg | INTRAMUSCULAR | Status: DC | PRN

## 2018-03-05 MED ORDER — ACETAMINOPHEN 650 MG RE SUPP: 650.0000 mg | RECTAL | Status: DC | PRN

## 2018-03-05 MED ORDER — STROKE: EARLY STAGES OF RECOVERY BOOK
Freq: Once | Status: AC
  Administered 2018-03-05: 10:00:00

## 2018-03-05 MED ORDER — PERFLUTREN LIPID MICROSPHERE
1.0000 mL | INTRAVENOUS | Status: AC | PRN
  Administered 2018-03-05: 2 mL via INTRAVENOUS
  Filled 2018-03-05: qty 10

## 2018-03-05 MED ORDER — ASPIRIN 300 MG RE SUPP: 300.0000 mg | Freq: Every day | RECTAL | Status: DC

## 2018-03-05 MED ORDER — ATORVASTATIN CALCIUM 80 MG PO TABS
80.0000 mg | ORAL_TABLET | Freq: Every day | ORAL | Status: DC
  Administered 2018-03-05 – 2018-03-06 (×2): 80 mg via ORAL
  Filled 2018-03-05 (×2): qty 1

## 2018-03-05 NOTE — Progress Notes (Addendum)
STROKE TEAM PROGRESS NOTE   INTERVAL HISTORY No one is at the bedside.  He is sitting on the edge of bed. Stable overnight. No new complaints. No R HP noted on exam. Past stroke symptoms presentations have had neg workup. He admitted to being anxious about getting near discharge date and having to go home.  Vitals:   03/05/18 0028 03/05/18 0602 03/05/18 0603 03/05/18 0724  BP: (!) 160/98 (!) 165/97 (!) 158/83 (!) 141/101  Pulse: (!) 58  71 71  Resp: 14  17 15   Temp:   98.3 F (36.8 C) 97.6 F (36.4 C)  TempSrc:   Oral Oral  SpO2: 100%  100% 99%  Weight:      Height:        CBC:  Recent Labs  Lab 03/04/18 2144 03/05/18 0731  WBC 7.9 7.1  HGB 13.2 13.5  HCT 42.0 42.3  MCV 87.9 87.0  PLT 211 008    Basic Metabolic Panel:  Recent Labs  Lab 03/04/18 2144 03/05/18 0731  NA 142 141  K 4.6 4.8  CL 109 108  CO2 25 25  GLUCOSE 161* 148*  BUN 29* 23*  CREATININE 1.53* 1.19  CALCIUM 9.2 9.5   Lipid Panel:     Component Value Date/Time   CHOL 164 03/05/2018 0731   TRIG 91 03/05/2018 0731   HDL 48 03/05/2018 0731   CHOLHDL 3.4 03/05/2018 0731   VLDL 18 03/05/2018 0731   LDLCALC 98 03/05/2018 0731   HgbA1c:  Lab Results  Component Value Date   HGBA1C 7.0 (H) 03/05/2018   Urine Drug Screen:     Component Value Date/Time   LABOPIA NONE DETECTED 01/23/2016 1923   COCAINSCRNUR POSITIVE (A) 01/23/2016 1923   LABBENZ NONE DETECTED 01/23/2016 1923   AMPHETMU NONE DETECTED 01/23/2016 1923   THCU NONE DETECTED 01/23/2016 1923   LABBARB NONE DETECTED 01/23/2016 1923    Alcohol Level     Component Value Date/Time   ETH <5 04/26/2015 1151    IMAGING Ct Head Wo Contrast  Result Date: 03/04/2018 CLINICAL DATA:  Right arm and leg feel weird. Symptoms beginning at 1930 hours today. Previous back injury affecting the left side. EXAM: CT HEAD WITHOUT CONTRAST TECHNIQUE: Contiguous axial images were obtained from the base of the skull through the vertex without  intravenous contrast. COMPARISON:  01/23/2016 FINDINGS: Brain: Mild diffuse cerebral atrophy. Ventricular dilatation is likely due to central atrophy. Normal pressure hydrocephalus would be a less likely consideration. Patchy white matter changes likely representing small vessel ischemia. No change since prior study. No mass-effect or midline shift. No abnormal extra-axial fluid collections. Gray-white matter junctions are distinct. Basal cisterns are not effaced. No acute intracranial hemorrhage. Vascular: Intracranial arterial vascular calcifications are present. Skull: Calvarium appears intact. Sinuses/Orbits: Clear. Other: None. IMPRESSION: No acute intracranial abnormalities. Mild diffuse cerebral atrophy and small vessel ischemic changes. Ventricular dilatation is likely due to central atrophy. Electronically Signed   By: Lucienne Capers M.D.   On: 03/04/2018 23:19   Mr Brain Wo Contrast  Result Date: 03/05/2018 CLINICAL DATA:  57 y/o M; slurred speech, stuttering, right arm weakness since yesterday. EXAM: MRI HEAD WITHOUT CONTRAST TECHNIQUE: Multiplanar, multiecho pulse sequences of the brain and surrounding structures were obtained without intravenous contrast. COMPARISON:  03/04/2018 CT head.  04/27/2015 MRI head. FINDINGS: Brain: No acute infarction, hemorrhage, hydrocephalus, extra-axial collection or mass lesion. Stable ventricle size. Moderate diffuse volume loss of the brain. Absent septum pellucidum. Vascular: Normal flow voids. Skull  and upper cervical spine: Normal marrow signal. Sinuses/Orbits: Negative. Other: Multiple dermal cyst, likely sebaceous cyst. IMPRESSION: 1. No acute intracranial abnormality. 2. Stable moderate diffuse volume loss of the brain and absent septum pellucidum. Electronically Signed   By: Kristine Garbe M.D.   On: 03/05/2018 05:00   Carotid Doppler   There is 1-39% bilateral ICA stenosis. Vertebral artery flow is antegrade.    PHYSICAL EXAM General:  Appears well-developed and well-nourished. Obese, Psych: Affect appropriate to situation Eyes: No scleral injection HENT: No OP obstrucion Head: Normocephalic.  Cardiovascular: Normal rate and regular rhythm.  Respiratory: Effort normal and breath sounds normal to anterior ascultation Skin: WDI. BUE light papular rash, also on his back. LE darkening lower 1/4 of leg   Neurological Examination Mental Status: Alert, oriented, thought content appropriate.  Speech fluent without evidence of aphasia. Able to follow 3 step commands without difficulty. Cranial Nerves: II: Visual fields grossly normal,  III,IV, VI: ptosis not present, extra-ocular motions intact bilaterally, pupils equal, round, reactive to light and accommodation V,VII: smile symmetric, facial light touch sensation normal bilaterally VIII: hearing normal bilaterally IX,X: uvula rises symmetrically XI: bilateral shoulder shrug XII: midline tongue extension Motor: Right :  Upper extremity   5/5                                      Left:     Upper extremity   4/5             Lower extremity   4+/5                                                  Lower extremity   4+/5 Tone and bulk: Left upper arm increased tone and spasticity with  Mild contracture of his L hand Sensory: Pinprick and light touch intact throughout, bilaterally Plantars: Right: downgoing                                Left: upgoing Cerebellar: normal finger-to-nose no gross ataxia Gait:did not assess    ASSESSMENT/PLAN [name] is a 57 y.o. male with history of HTN, DB, spinal cord injury with resultant L HP, cociane abuse, alcohol abuse (no documented stroke) presenting with R sided weakness.   TIA vs. Conversion disorder  CT head no acute abnormalities. Small vessel disease. Atrophy. Ventricular dilation  MRI  No acute stroke. Atrophy. Absent septum pellucidum  MRA  pending   Carotid Doppler  B ICA 1-39% stenosis, VAs antegrade   2D  Echo  EF 65-70%   LDL 98  HgbA1c 7.0  Lovenox 40 mg sq daily for VTE prophylaxis  Reported not taking aspirin 81 mg daily routinely prior to admission, now on aspirin 325 mg daily. Ok to resume home dose of 81 mg and continue at d/c. Orders adjusted.  Therapy recommendations:  HH PT  Disposition:  Return home  Hypertension  Stable . BP goal normotensive  Hyperlipidemia  Home meds:  lipitor 40 - states he was taking regularly  Increased to Lipitor 80 mg in hospital  LDL 98, goal < 70  Continue statin at discharge  Diabetes type II w/ PN  HgbA1c 7.0, goal <  7.0  Other Stroke Risk Factors  Cigarette smoker, advised to stop smoking  ETOH use, advised to drink no more than 2 drink(s) a day  Crack cocaine use, advised to avoid d/t stroke risk  Obesity, Body mass index is 33.75 kg/m., recommend weight loss, diet and exercise as appropriate   No real Hx stroke/TIA  04/2015 Pre-existing L weakness/spasticity, not related to new stroke or spinal disease, etiology unclear. MRI neg  12/2013 generalized weakness. MRI neg. Intoxicated. Fall. Cord edema/contusion   05/12/2012 L HP and stuttering speech. MRI neg. conversion d/o  02-22-2011 ?TIA  L side numbness/weakness  12-20-2010  MRI neg  Family hx stroke (father)  Chronic diastolic Congestive heart failure  Other Active Problems  HOH  abnormal EKG changes  BLE edema  Hospital day # 0  Burnetta Sabin, MSN, APRN, ANVP-BC, AGPCNP-BC Advanced Practice Stroke Nurse [place] for Schedule & Pager information 03/05/2018 9:37 AM   ATTENDING NOTE: I reviewed above note and agree with the assessment and plan. I have made any additions or clarifications directly to the above note. Pt was seen and examined.   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  He had extensive TIAs/strokes history all MRI negative in the past (see below), and he was once diagnosed with conversion disorder too.  02/2011 TIA with left sided numbness/weakness, MRI negative.  04/2012 left-sided weakness and stuttering, MRI negative.  Considered conversion disorder  12/2013, generalized weakness after alcohol intoxication and fall.  Found to have cord edema at C5-6 due to cord contusion post a fall.  MRI brain negative.  04/2015 left arm and leg weakness.  MRI brain negative.  MRI C-spine stable.  05/2015 - patient follow-up with Dr. Krista Blue for gait difficulty.  On this admission, MRI brain again negative.  Carotid Doppler negative.  EF 65 to 70%.  A1c 7.0 LDL 98.  MRI and UDS pending.  Patient current condition concern for TIA versus conversion disorder.  Continue aspirin 325 and Lipitor 80 for stroke prevention.  Stroke risk factor modification.  PT/OT.  Will follow.  Rosalin Hawking, MD PhD Stroke Neurology 03/05/2018 5:48 PM    To contact Stroke Continuity provider, please refer to [email or web address] After hours, contact General Neurology

## 2018-03-05 NOTE — Evaluation (Addendum)
Speech Language Pathology Evaluation Patient Details Name: [name] MRN: [id number] DOB: January 25, 1961 Today's Date: 03/05/2018 Time: 6073-7106 SLP Time Calculation (min) (ACUTE ONLY): 31 min  Problem List:  Patient Active Problem List   Diagnosis Date Noted  . TIA (transient ischemic attack) 03/05/2018  . Sensorineural hearing loss of both ears 12/04/2017  . Acute diastolic congestive heart failure ([place])   . Edema of both legs 05/21/2017  . Mild nonproliferative diabetic retinopathy ([place]) 01/10/2016  . Hypertensive retinopathy of both eyes 01/10/2016  . Spinal stenosis of lumbar region 07/15/2015  . Abnormality of gait 06/07/2015  . Hydrocephalus 06/07/2015  . Peripheral neuropathy 04/26/2015  . Constipation 04/26/2015  . Diabetes mellitus type 2 with complications ([place]) 26/94/8546  . Decreased hearing of both ears 03/15/2015  . Tinea pedis 03/15/2015  . Callus of foot 05/22/2014  . Contusion of cervical cord ([place]) 05/22/2014  . Coronary vasospasm, cocaine related 12/29/2013  . Cocaine abuse ([place]) 12/28/2013  . Alcoholism /alcohol abuse ([place]) 12/28/2013  . Left-sided weakness 12/26/2013  . Hyperlipidemia associated with type 2 diabetes mellitus ([place]) 04/01/2007  . OBESITY NOS 03/28/2007  . Essential hypertension 03/28/2007   Past Medical History:  Past Medical History:  Diagnosis Date  . Alcohol abuse    11-15-2017  per pt last alcohol Dec 2018  . BPH with obstruction/lower urinary tract symptoms   . Chronic arm pain   . Chronic pain    arms, leg, back  . CKD (chronic kidney disease), stage II   . Cocaine abuse ([place])    11-15-2017  per pt last used March 2019  . Congenital hydrocephalus, unspecified (HCC)    slow progression  . Diabetic retinopathy of both eyes ([place])   . Diastolic CHF, chronic ([place]) 07/2017  . Gait instability    multifactorial -- slow worsening congenital hydrocephalus, peripheral neuropathy, left C5-6 cord lesion  . Hematuria   . History of  acute pulmonary edema 07/2017  . History of spinal cord injury 01/02/2014   pt fell, caused spinal cord contusion at C5-6--  residual left side weakness    . 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 History of TIA (transient ischemic attack) and stroke    hx cva 12-20-2010 and  TIA 02-22-2011--- no residual's from cva or tia  . Hyperlipidemia   . Hypertension    followed by pcp  . 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 Lesion of bladder   . Peripheral edema    chronic LUE  . Peripheral neuropathy   . Type 2 diabetes mellitus ([place])    followed by dr Karle Plumber--  last A1c 6.8 on 11-06-2017  . Weak urinary stream    Past Surgical History:  Past Surgical History:  Procedure Laterality Date  . CARDIAC CATHETERIZATION  12/01/2008   dr hochrein   abnormal stress myoview:  mild coronary plaque, normal LVF  . CARDIAC CATHETERIZATION  04/29/2011   dr Martinique   in setting ECG with new ST elevation & severe hypertensive:  nonobstructive atherosclerotic CAD, normal LVF (30% mRCA)   . CARDIOVASCULAR STRESS TEST  08/15/2010   Low nuclear study w/ no evidence ishemia/  ef 42% with lateral and apical hypokinesis  . CYSTOSCOPY WITH BIOPSY N/A 11/20/2017   Procedure: CYSTOSCOPY WITH BIOPSY AND FULGURATION;  Surgeon: [name], MD;  Location: [facility];  Service: Urology;  Laterality: N/A;  . INCISION AND DRAINAGE  RIGHT DISTAL MEDIAL THIGH HEMATOMA  04-14-2005   dr Ninfa Linden  . left arm skin graft  1976   injury  . LEFT HEART CATHETERIZATION WITH CORONARY ANGIOGRAM Bilateral 12/26/2013   Procedure: LEFT HEART CATHETERIZATION WITH CORONARY ANGIOGRAM;  Surgeon: Burnell Blanks, MD;  Location: [facility] CATH LAB;  Service: Cardiovascular;  Laterality: Bilateral;  STEMI, in setting cocaine/ alcohol :  mid disease in the RCA (20%), moderate disease in the small caliber intermediate branch  (distal 50-60%), normal LVSF (ef 55-60%)  . TRANSTHORACIC ECHOCARDIOGRAM  08/05/2017   moderate LVH,  ef 55%,  grade 1 diastolic dysfunction/  mild LAE/  trivial Tr   HPI:  [name] is a 57 y.o. male with history of diabetes mellitus type 2, diastolic dysfunction, hypertension, alcohol and cocaine abuse previously with history of left-sided weakness from spinal cord injury started experiencing right-sided weakness around 7:30 PM last evening while watching TV at home.  Patient states that he had difficulty moving his right upper extremity against gravity.  Denies any difficulty with speaking swallowing or any visual symptoms.  Symptoms persisted until he came to the ER.  MRI 8/13 was negative for any acute findings.  Assessment / Plan / Recommendation Clinical Impression  Pt presents with mild expressive language deficits c/b anomia and phonemic paraphasia ("do" for "shoe").  Pt is able to utilize circumlocution strategy and describe target words.  Pt also benefits from slowed speech rate.  He uses these strategies independently and was noted to self correct during evaluation.  Pt reports word finding difficulty and requires extra time to respond.  Pt presents with mild dysarthria c/b slow rate and articulatory imprecision.  Pt reports that speech has sounded slurred at times. Although, pt was 100% intelligible in conversation, some hesitations and  Sound/syllable repetitions were noted.  Pt reports that this is a change from his baseline.  Pt reports some hearing impairment, but does not have hearing aids, as impairment does not reach threshold for them to be covered.  Pt did request a few repetitions, but was able to participate in evaluation without difficulty. Reading/writing were not assessed this date as pt requires glasses, but they were not present for evaluation.  Pt's cognitive function appears WFL.  Cognitive ability was assessed using the COGNISTAT.  All subtests were within average  range, except where otherwise indicated.  Although pt did exhibit mild impairment with calculations, he states that his is his baseline.  Deficits in attention may be related to hearing impairment, as pt demonstrated no difficulty with sustained attention throughout evaluation and required no redirection or cuing to complete tasks.  Orientation: 12/12 Attention: 5/8, Mild impariment Comprehension: 5/6 Repetition: 12/12 Naming: 7/8 Memory: 10/12 Calculation: 2/4 Similarities: 5/8 Judgment: 5/6     SLP Assessment  SLP Recommendation/Assessment: Patient needs continued Speech Lanaguage Pathology Services SLP Visit Diagnosis: Dysarthria and anarthria (R47.1);Aphasia (R47.01)    Follow Up Recommendations  Outpatient SLP    Frequency and Duration min 1 x/week  2 weeks      SLP Evaluation Cognition  Overall Cognitive Status: Within Functional Limits for tasks assessed Arousal/Alertness: Awake/alert Orientation Level: Oriented X4 Attention: Focused;Sustained Focused Attention: Appears intact Sustained Attention: Appears intact Memory: Appears intact Awareness: Appears intact Problem Solving: Appears intact Executive Function: Reasoning Reasoning: Appears intact       Comprehension  Auditory Comprehension Overall Auditory Comprehension: Appears within functional limits for tasks assessed Commands: Within Functional Limits    Expression Expression Primary Mode of Expression: Verbal Verbal  Expression Overall Verbal Expression: Impaired Repetition: No impairment Naming: Impairment Confrontation: Impaired Other Naming Comments: Circumlocution effective Verbal Errors: (Phonemic paraphasia) Pragmatics: No impairment   Oral / Motor  Motor Speech Overall Motor Speech: Impaired(mild) Intelligibility: Intelligible Effective Techniques: Slow rate   GO                    Karmello Abercrombie E Muhammed Teutsch, MA, CCC-SLP.  Pager [phone] 03/05/2018, 9:37 AM

## 2018-03-05 NOTE — ED Notes (Signed)
Pt reports R arm shaking and weakness since yesterday.  Started to have speech problem tonight- slurred speech, stuttering noted.  Pt is A&Ox 4.

## 2018-03-05 NOTE — Evaluation (Signed)
Physical Therapy Evaluation Patient Details Name: [name] MRN: [id number] DOB: Aug 02, 1960 Today's Date: 03/05/2018   History of Present Illness  57 y.o. male with history of diabetes mellitus type 2, diastolic dysfunction, hypertension, alcohol and cocaine abuse previously with history of left-sided weakness from spinal cord injury started experiencing right-sided weakness. Neuro work up underway  Clinical Impression  Orders received for PT evaluation. Patient demonstrates deficits in functional mobility as indicated below. Will benefit from continued skilled PT to address deficits and maximize function. Will see as indicated and progress as tolerated.     Follow Up Recommendations Home health PT;Supervision - Intermittent    Equipment Recommendations  (small base quad cane)    Recommendations for Other Services       Precautions / Restrictions Precautions Precautions: Fall      Mobility  Bed Mobility Overal bed mobility: Needs Assistance Bed Mobility: Supine to Sit     Supine to sit: Supervision     General bed mobility comments: supervision for safety with verbal cues to direct to task, increased time and effort to perform.   Transfers Overall transfer level: Needs assistance Equipment used: Quad cane Transfers: Sit to/from Stand Sit to Stand: Supervision;Min guard         General transfer comment: Min guard for safety and stability, no physical assist required. Patient with very slow transition to upright and delayed initiation of power up to standing. Question processing, planning, or weakness as cause  Ambulation/Gait Ambulation/Gait assistance: Min guard Gait Distance (Feet): 90 Feet Assistive device: Quad cane Gait Pattern/deviations: Step-to pattern;Decreased stride length;Drifts right/left;Decreased weight shift to left;Decreased step length - left Gait velocity: decreased Gait velocity interpretation: <1.31 ft/sec, indicative of household  ambulator General Gait Details: patient with noted LLE deficits (baseline), reports new RLE deficits (fatigues quicker than usual, difficulty keeping pace during ambulation. Min guard for Scientist, research (medical)    Modified Rankin (Stroke Patients Only) Modified Rankin (Stroke Patients Only) Pre-Morbid Rankin Score: Moderate disability Modified Rankin: Moderate disability     Balance Overall balance assessment: Needs assistance Sitting-balance support: Feet supported Sitting balance-Leahy Scale: Good     Standing balance support: Single extremity supported;During functional activity Standing balance-Leahy Scale: Fair Standing balance comment: able to manage static and dynamic standing tasks with use of small quad cane             High level balance activites: Direction changes;Turns High Level Balance Comments: min guard for safety, some instability noted. No physical assist required.              Pertinent Vitals/Pain Pain Assessment: Faces Faces Pain Scale: Hurts a little bit Pain Intervention(s): Monitored during session    Home Living Family/patient expects to be discharged to:: Private residence Living Arrangements: Alone Available Help at Discharge: Family;Friend(s);Available PRN/intermittently Type of Home: Apartment Home Access: Level entry     Home Layout: One level Home Equipment: Walker - 2 wheels;Cane - single point;Cane - quad Additional Comments: patient has an aide that comes every day for 2 hours a day    Prior Function Level of Independence: Independent with assistive device(s)         Comments: utilizes a quad cane for mobility     Hand Dominance   Dominant Hand: Right    Extremity/Trunk Assessment   Upper Extremity Assessment Upper Extremity Assessment: Defer to OT evaluation    Lower Extremity Assessment Lower Extremity Assessment:  LLE deficits/detail LLE Deficits / Details: patient with noted  assymetrical wekaness L> R at baseline, currently presents with 3+ gross strength upon testing, 4+/5 dorsiflexion LLE Sensation: decreased light touch;decreased proprioception LLE Coordination: decreased fine motor;decreased gross motor       Communication   Communication: No difficulties  Cognition Arousal/Alertness: Awake/alert Behavior During Therapy: Flat affect Overall Cognitive Status: Impaired/Different from baseline Area of Impairment: Problem solving;Following commands                       Following Commands: Follows one step commands with increased time;Follows multi-step commands inconsistently     Problem Solving: Decreased initiation;Requires verbal cues;Requires tactile cues;Slow processing General Comments: patient appeasr very deliberate in cogntive processing, affect makes it difficult to discern slow processing compared to flat affect      General Comments      Exercises     Assessment/Plan    PT Assessment Patient needs continued PT services  PT Problem List Decreased strength;Decreased activity tolerance;Decreased balance;Decreased mobility;Decreased safety awareness;Decreased coordination       PT Treatment Interventions DME instruction;Gait training;Functional mobility training;Therapeutic activities;Therapeutic exercise;Balance training;Neuromuscular re-education;Cognitive remediation;Patient/family education    PT Goals (Current goals can be found in the Care Plan section)  Acute Rehab PT Goals Patient Stated Goal: to go home PT Goal Formulation: With patient Time For Goal Achievement: 03/19/18 Potential to Achieve Goals: Good    Frequency Min 3X/week   Barriers to discharge        Co-evaluation               AM-PAC PT "6 Clicks" Daily Activity  Outcome Measure Difficulty turning over in bed (including adjusting bedclothes, sheets and blankets)?: A Little Difficulty moving from lying on back to sitting on the side of the  bed? : A Lot Difficulty sitting down on and standing up from a chair with arms (e.g., wheelchair, bedside commode, etc,.)?: A Lot Help needed moving to and from a bed to chair (including a wheelchair)?: A Little Help needed walking in hospital room?: A Little Help needed climbing 3-5 steps with a railing? : A Lot 6 Click Score: 15    End of Session Equipment Utilized During Treatment: Gait belt Activity Tolerance: Patient tolerated treatment well Patient left: in chair;with call bell/phone within reach;with chair alarm set;with nursing/sitter in room Nurse Communication: Mobility status PT Visit Diagnosis: Unsteadiness on feet (R26.81);Difficulty in walking, not elsewhere classified (R26.2)    Time: 8101-7510 PT Time Calculation (min) (ACUTE ONLY): 21 min   Charges:   PT Evaluation $PT Eval Moderate Complexity: 1 Mod          [name], PT DPT  Board Certified Neurologic Specialist [place] 03/05/2018, 11:46 AM

## 2018-03-05 NOTE — Consult Note (Signed)
Requesting Physician: Dr. Eligha Bridegroom    Chief Complaint: Right side weakness  History obtained from: Patient and Chart    HPI:                                                                                                                                       [name] is an 57 y.o. male past medical history of CVA,  hypertension, diabetes, CHF, spinal cord injury with  left hemiparesis, cocaine abuse, alcohol abuse presents to the emergency department with sudden onset right-sided weakness around 7:30 PM last night while watching TV.  Was brought by EMS however not stroke alerted.  Patient symptoms have improved, however still feels weaker on his right side.  He denies any slurred speech facial droop this occurred.  T head was performed which showed chronic changes. Patient also complaining of chest pain.   Date last known well: 8.12.19 Time last known well: 7.30 p.m. tPA Given: No, outside the window when neurology was consulted NIHSS: 1 Baseline MRS 1   Past Medical History:  Diagnosis Date  . Alcohol abuse    11-15-2017  per pt last alcohol Dec 2018  . BPH with obstruction/lower urinary tract symptoms   . Chronic arm pain   . Chronic pain    arms, leg, back  . CKD (chronic kidney disease), stage II   . Cocaine abuse ([place])    11-15-2017  per pt last used March 2019  . Congenital hydrocephalus, unspecified (HCC)    slow progression  . Diabetic retinopathy of both eyes ([place])   . Diastolic CHF, chronic ([place]) 07/2017  . Gait instability    multifactorial -- slow worsening congenital hydrocephalus, peripheral neuropathy, left C5-6 cord lesion  . Hematuria   . History of acute pulmonary edema 07/2017  . History of spinal cord injury 01/02/2014   pt fell, caused spinal cord contusion at C5-6--  residual left side weakness    . 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 History of TIA (transient ischemic attack)  and stroke    hx cva 12-20-2010 and  TIA 02-22-2011--- no residual's from cva or tia  . Hyperlipidemia   . Hypertension    followed by pcp  . 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 Lesion of bladder   . Peripheral edema    chronic LUE  . Peripheral neuropathy   . Type 2 diabetes mellitus ([place])    followed by dr Karle Plumber--  last A1c 6.8 on 11-06-2017  . Weak urinary stream     Past Surgical History:  Procedure Laterality Date  . CARDIAC CATHETERIZATION  12/01/2008   dr hochrein   abnormal stress myoview:  mild coronary plaque, normal LVF  . CARDIAC CATHETERIZATION  04/29/2011   dr [place]   in setting ECG with new ST elevation &  severe hypertensive:  nonobstructive atherosclerotic CAD, normal LVF (30% mRCA)   . CARDIOVASCULAR STRESS TEST  08/15/2010   Low nuclear study w/ no evidence ishemia/  ef 42% with lateral and apical hypokinesis  . CYSTOSCOPY WITH BIOPSY N/A 11/20/2017   Procedure: CYSTOSCOPY WITH BIOPSY AND FULGURATION;  Surgeon: [name], MD;  Location: [facility];  Service: Urology;  Laterality: N/A;  . INCISION AND DRAINAGE RIGHT DISTAL MEDIAL THIGH HEMATOMA  04-14-2005   dr Ninfa Linden  . left arm skin graft  1976   injury  . LEFT HEART CATHETERIZATION WITH CORONARY ANGIOGRAM Bilateral 12/26/2013   Procedure: LEFT HEART CATHETERIZATION WITH CORONARY ANGIOGRAM;  Surgeon: Burnell Blanks, MD;  Location[facility] CATH LAB;  Service: Cardiovascular;  Laterality: Bilateral;  STEMI, in setting cocaine/ alcohol :  mid disease in the RCA (20%), moderate disease in the small caliber intermediate branch (distal 50-60%), normal LVSF (ef 55-60%)  . TRANSTHORACIC ECHOCARDIOGRAM  08/05/2017   moderate LVH,  ef 55%,  grade 1 diastolic dysfunction/  mild LAE/  trivial Tr    Family History  Problem Relation Age of Onset  . Hypertension Mother   . Diabetes Mother   . Cancer Mother        breast cancer   . Hypertension Father   .  Heart disease Father   . Stroke Father   . Hypertension Sister    Social History:  reports that he has been smoking cigarettes. He has smoked for the past 45.00 years. He has never used smokeless tobacco. He reports that he drank alcohol. He reports that he has current or past drug history. Drugs: "Crack" cocaine and Cocaine.  Allergies:  Allergies  Allergen Reactions  . Amlodipine Swelling    Lower extremity swelling    Medications:                                                                                                                        I reviewed home medications. On ASA 81mg  at home    ROS:                                                                                                                                     14 systems reviewed and negative except above    Examination:  General: Appears well-developed and well-nourished.  Psych: Affect appropriate to situation Eyes: No scleral injection HENT: No OP obstrucion Head: Normocephalic.  Cardiovascular: Normal rate and regular rhythm.  Respiratory: Effort normal and breath sounds normal to anterior ascultation GI: Soft.  No distension. There is no tenderness.  Skin: WDI    Neurological Examination Mental Status: Alert, oriented, thought content appropriate.  Speech fluent without evidence of aphasia. Able to follow 3 step commands without difficulty. Cranial Nerves: II: Visual fields grossly normal,  III,IV, VI: ptosis not present, extra-ocular motions intact bilaterally, pupils equal, round, reactive to light and accommodation V,VII: smile symmetric, facial light touch sensation normal bilaterally VIII: hearing normal bilaterally IX,X: uvula rises symmetrically XI: bilateral shoulder shrug XII: midline tongue extension Motor: Right : Upper extremity   5/5    Left:     Upper extremity   4/5  Lower  extremity   4/5     Lower extremity   4+/5 Tone and bulk: Left upper arm increased tone and spasticity with  Mild contracture of his hand Sensory: Pinprick and light touch intact throughout, bilaterally Deep Tendon Reflexes: 4+ over left knee, 3+ over right kee, 3+ bilaterally over upper extremities Plantars: Right: downgoing   Left: upgoing Cerebellar: normal finger-to-nose no gross ataxia Gait:did not assess     Lab Results: Basic Metabolic Panel: Recent Labs  Lab 03/04/18 2144  NA 142  K 4.6  CL 109  CO2 25  GLUCOSE 161*  BUN 29*  CREATININE 1.53*  CALCIUM 9.2    CBC: Recent Labs  Lab 03/04/18 2144  WBC 7.9  HGB 13.2  HCT 42.0  MCV 87.9  PLT 211    Coagulation Studies: No results for input(s): LABPROT, INR in the last 72 hours.  Imaging: Ct Head Wo Contrast  Result Date: 03/04/2018 CLINICAL DATA:  Right arm and leg feel weird. Symptoms beginning at 1930 hours today. Previous back injury affecting the left side. EXAM: CT HEAD WITHOUT CONTRAST TECHNIQUE: Contiguous axial images were obtained from the base of the skull through the vertex without intravenous contrast. COMPARISON:  01/23/2016 FINDINGS: Brain: Mild diffuse cerebral atrophy. Ventricular dilatation is likely due to central atrophy. Normal pressure hydrocephalus would be a less likely consideration. Patchy white matter changes likely representing small vessel ischemia. No change since prior study. No mass-effect or midline shift. No abnormal extra-axial fluid collections. Gray-white matter junctions are distinct. Basal cisterns are not effaced. No acute intracranial hemorrhage. Vascular: Intracranial arterial vascular calcifications are present. Skull: Calvarium appears intact. Sinuses/Orbits: Clear. Other: None. IMPRESSION: No acute intracranial abnormalities. Mild diffuse cerebral atrophy and small vessel ischemic changes. Ventricular dilatation is likely due to central atrophy. Electronically Signed   By:  Lucienne Capers M.D.   On: 03/04/2018 23:19     ASSESSMENT AND PLAN  57 y.o. male past medical history of CVA,  hypertension, diabetes, CHF, spinal cord injury with  left hemiparesis, cocaine abuse, alcohol abuse presents to the emergency department with sudden onset right-sided weakness around 7:30 PM last night while watching TV.  His right leg appears to be weak, mild right upper extremity weakness.    Possible Mild stroke  Risk factors: Hypertension, cocaine abuse, hyperlipidemia, diabetes Etiology: small vessel disease  Recommend # URINE DRUG SCREEN, URINE ANALYSIS # MRI of the brain without contrast #MRA Head and neck  #Transthoracic Echo  # Start patient on ASA 325mg  daily #Start or continue Atorvastatin 80 mg/other high intensity statin # BP goal: permissive HTN upto 220/120 mmHg (  185/110 if patient has CHF, CKD) # HBAIC and Lipid profile # Telemetry monitoring # Frequent neuro checks # NPO until passes stroke swallow screen  Please page stroke NP  Or  PA  Or MD from 8am -4 pm  as this patient from this time will be  followed by the stroke.   You can look them up on [email or web address]  Password [facility]   Sushanth Aroor [facility] Pager Number [phone]

## 2018-03-05 NOTE — Progress Notes (Addendum)
PROGRESS NOTE  JAMESPAUL SECRIST ZOX:[id number] DOB: 1961/07/06 DOA: 03/04/2018 PCP: Ladell Pier, MD  Brief Narrative: 57 year old man PMH essential hypertension, diabetes, spinal cord injury with left hemiparesis, cocaine abuse, alcohol abuse presented with sudden onset right-sided weakness with difficulty speaking.  On admission noted to have right leg weakness and mild right upper extremity weakness.  Concern for stroke.  Seen by neurology, recommendation for admission for further evaluation.  Assessment/Plan Right upper, right lower extremity weakness, dysarthria; TIA.  MRI brain negative. --Continue aspirin 325 mg daily, atorvastatin --Follow-up MRA brain, bilateral carotid ultrasound, 2D echocardiogram --Follow-up neurology recommendations  Abnormal EKG --Abnormalities probably reflect left ventricular hypertrophy with repolarization abnormalities.  Troponin modestly elevated 0.03, he is asymptomatic, there is no signs or symptoms to suggest ACS or even demand ischemia.  Will trend troponin.  No therapy indicated at this point.  Continue aspirin.  Essential hypertension --Stable.  Permissive hypertension up to 220/120  Diabetes mellitus type 2.  Hemoglobin A1c 7.0. --Stable.  Continue sliding scale insulin.  Hold glipizide and metformin.  Chronic diastolic CHF --Some lower extremity edema but overall appears stable. --Resume furosemide 8/14  PMH spinal cord injury with left hemiparesis  PMH cocaine, alcohol abuse.  No urine drug screen ordered on admission.  Cocaine positive 01/23/2016 --Check urine drug screen.  DVT prophylaxis: enoxaparin Code Status: Full Family Communication: none Disposition Plan: outpt ST, HHPT   Murray Hodgkins, MD  [facility] Direct contact: [phone] --Via amion app OR  --[email or web address]; password [email or web address]  7PM-7AM contact night coverage as above 03/05/2018, 12:05 PM  LOS: 0 days   Consultants:  Neurology    Procedures:    Antimicrobials:    Interval history/Subjective: Feels ok; right arm and leg weakness better. No numbness or tingling. Swallowing fine. Speaking fine.  Objective: Vitals:  Vitals:   03/05/18 0724 03/05/18 1126  BP: (!) 141/101 (!) 151/72  Pulse: 71 68  Resp: 15 15  Temp: 97.6 F (36.4 C) (!) 97.4 F (36.3 C)  SpO2: 99% 98%    Exam:  Constitutional:  . Appears calm and comfortable Eyes:  . pupils and irises appear normal . Normal lids ENMT:  . grossly normal hearing  . Lips appear normal Respiratory:  . CTA bilaterally, no w/r/r.  . Respiratory effort normal. Cardiovascular:  . RRR, no m/r/g . 1+ let pedal edema   Abdomen:  . Abdomen appears normal; no tenderness or masses . No hernias . No HSM Musculoskeletal:  . Digits/nails BUE: no clubbing, cyanosis, petechiae, infection . Left hand with extension contracture . Strength BUE/BLE 4/5, symmetric. Neurologic:  . CN grossly intact . Sensation BLE grossly intact Psychiatric:  . Mental status o Mood, affect appropriate  I have personally reviewed the following:   Labs:  Creatinine back to nl 1.09, BUN trending down  Glucose 148  CMP otherwise unremarkable  Troponin modestly elevated 0.03  LDL 98.  CBC unremarkable.  Imaging studies:  MRI brain no acute abnormalities  Medical tests:  EKG sinus bradycardia, LVH with repolarization abnormality; inferior T wave inversions  Scheduled Meds: . aspirin  300 mg Rectal Daily   Or  . aspirin  325 mg Oral Daily  . atorvastatin  80 mg Oral Daily  . enoxaparin (LOVENOX) injection  40 mg Subcutaneous Q24H  . [START ON 03/06/2018] furosemide  40 mg Oral Daily  . gabapentin  300 mg Oral TID  . insulin aspart  0-9 Units Subcutaneous TID WC  . labetalol  50 mg Oral BID  Continuous Infusions:  Principal Problem:   TIA (transient ischemic attack) Active Problems:   Hyperlipidemia associated with type 2 diabetes mellitus ([place])    Essential hypertension   Cocaine abuse ([place])   Alcoholism /alcohol abuse ([place])   Decreased hearing of both ears   Diabetes mellitus type 2 with complications ([place])   Benign essential HTN   LOS: 0 days     Time 0925- 10 AM, review of chart, discussion with patient, formulation of plan.

## 2018-03-05 NOTE — H&P (Addendum)
History and Physical    BERNIS SCHREUR OZH:[id number] DOB: 09-Jun-1961 DOA: 03/04/2018  PCP: Ladell Pier, MD  Patient coming from: Home.  Chief Complaint: Right-sided weakness.  HPI: Gearold NIRANJAN RUFENER is a 57 y.o. male with history of diabetes mellitus type 2, diastolic dysfunction, hypertension, alcohol and cocaine abuse previously with history of left-sided weakness from spinal cord injury started experiencing right-sided weakness around 7:30 PM last evening while watching TV at home.  Patient states that he had difficulty moving his right upper extremity against gravity.  Denies any difficulty with speaking swallowing or any visual symptoms.  Symptoms persisted until he came to the ER.  ED Course: In the ER patient was evaluated by neurologist and CT head is unremarkable.  Neurologist recommended admission for further stroke evaluation.  MRI brain done prior to my exam was negative for any stroke.  On exam patient states he still has mild weakness of the right upper extremity.  Has chronic left lower extremity weakness from previous spinal cord injury.  No facial asymmetry.  Patient states he has not taken any alcohol cocaine since last December.  Review of Systems: As per HPI, rest all negative.   Past Medical History:  Diagnosis Date  . Alcohol abuse    11-15-2017  per pt last alcohol Dec 2018  . BPH with obstruction/lower urinary tract symptoms   . Chronic arm pain   . Chronic pain    arms, leg, back  . CKD (chronic kidney disease), stage II   . Cocaine abuse ([place])    11-15-2017  per pt last used March 2019  . Congenital hydrocephalus, unspecified (HCC)    slow progression  . Diabetic retinopathy of both eyes ([place])   . Diastolic CHF, chronic ([place]) 07/2017  . Gait instability    multifactorial -- slow worsening congenital hydrocephalus, peripheral neuropathy, left C5-6 cord lesion  . Hematuria   . History of acute pulmonary edema 07/2017  . History of spinal cord injury  01/02/2014   pt fell, caused spinal cord contusion at C5-6--  residual left side weakness    . 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 History of TIA (transient ischemic attack) and stroke    hx cva 12-20-2010 and  TIA 02-22-2011--- no residual's from cva or tia  . Hyperlipidemia   . Hypertension    followed by pcp  . 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 Lesion of bladder   . Peripheral edema    chronic LUE  . Peripheral neuropathy   . Type 2 diabetes mellitus ([place])    followed by dr Karle Plumber--  last A1c 6.8 on 11-06-2017  . Weak urinary stream     Past Surgical History:  Procedure Laterality Date  . CARDIAC CATHETERIZATION  12/01/2008   dr hochrein   abnormal stress myoview:  mild coronary plaque, normal LVF  . CARDIAC CATHETERIZATION  04/29/2011   dr [place]   in setting ECG with new ST elevation & severe hypertensive:  nonobstructive atherosclerotic CAD, normal LVF (30% mRCA)   . CARDIOVASCULAR STRESS TEST  08/15/2010   Low nuclear study w/ no evidence ishemia/  ef 42% with lateral and apical hypokinesis  . CYSTOSCOPY WITH BIOPSY N/A 11/20/2017   Procedure: CYSTOSCOPY WITH BIOPSY AND FULGURATION;  Surgeon: [name], MD;  Location: [facility];  Service: Urology;  Laterality: N/A;  . INCISION AND DRAINAGE RIGHT  DISTAL MEDIAL THIGH HEMATOMA  04-14-2005   dr Ninfa Linden  . left arm skin graft  1976   injury  . LEFT HEART CATHETERIZATION WITH CORONARY ANGIOGRAM Bilateral 12/26/2013   Procedure: LEFT HEART CATHETERIZATION WITH CORONARY ANGIOGRAM;  Surgeon: Burnell Blanks, MD;  Location: [facility] CATH LAB;  Service: Cardiovascular;  Laterality: Bilateral;  STEMI, in setting cocaine/ alcohol :  mid disease in the RCA (20%), moderate disease in the small caliber intermediate branch (distal 50-60%), normal LVSF (ef 55-60%)  . TRANSTHORACIC ECHOCARDIOGRAM  08/05/2017    moderate LVH,  ef 55%,  grade 1 diastolic dysfunction/  mild LAE/  trivial Tr     reports that he has been smoking cigarettes. He has smoked for the past 45.00 years. He has never used smokeless tobacco. He reports that he drank alcohol. He reports that he has current or past drug history. Drugs: "Crack" cocaine and Cocaine.  Allergies  Allergen Reactions  . Amlodipine Swelling    Lower extremity swelling    Family History  Problem Relation Age of Onset  . Hypertension Mother   . Diabetes Mother   . Cancer Mother        breast cancer   . Hypertension Father   . Heart disease Father   . Stroke Father   . Hypertension Sister     Prior to Admission medications   Medication Sig Start Date End Date Taking? Authorizing Provider  atorvastatin (LIPITOR) 40 MG tablet Take 1 tablet (40 mg total) by mouth every morning. Patient taking differently: Take 40 mg by mouth daily.  02/06/18  Yes Ladell Pier, MD  cetirizine (ZYRTEC) 10 MG tablet Take 1 tablet (10 mg total) by mouth daily. 09/26/17  Yes Argentina Donovan, PA-C  furosemide (LASIX) 40 MG tablet Take 1 tablet (40 mg total) by mouth every morning. Patient taking differently: Take 40 mg by mouth daily.  12/18/17  Yes Ladell Pier, MD  gabapentin (NEURONTIN) 300 MG capsule TAKE 1 CAPSULE (300 MG TOTAL) BY MOUTH THREE TIMES DAILY. 02/22/17  Yes Funches, Adriana Mccallum, MD  glipiZIDE (GLIPIZIDE XL) 10 MG 24 hr tablet Take 1 tablet (10 mg total) by mouth daily with breakfast. 02/06/18  Yes Ladell Pier, MD  labetalol (NORMODYNE) 100 MG tablet Take 0.5 tablets (50 mg total) by mouth 2 (two) times daily. Patient taking differently: Take 50 mg by mouth 2 (two) times daily.  11/06/17  Yes Ladell Pier, MD  lisinopril (PRINIVIL,ZESTRIL) 40 MG tablet Take 1 tablet (40 mg total) by mouth daily. Patient taking differently: Take 40 mg by mouth daily.  08/03/17  Yes Ladell Pier, MD  metFORMIN (GLUCOPHAGE) 1000 MG tablet Take 1 tablet  (1,000 mg total) by mouth 2 (two) times daily with a meal. 02/06/18  Yes Ladell Pier, MD  aspirin EC 81 MG tablet Take 1 tablet (81 mg total) by mouth daily. Patient not taking: Reported on 03/05/2018 03/13/17   Ladell Pier, MD  Blood Glucose Monitoring Suppl (TRUE METRIX METER) w/Device KIT Use as directed 01/17/18   Ladell Pier, MD  clotrimazole (LOTRIMIN) 1 % cream Apply to groin area 2 times daily Patient not taking: Reported on 03/05/2018 09/30/17   Glyn Ade, PA-C  clotrimazole-betamethasone (LOTRISONE) cream APPLY 1 APPLICATION TWICE A DAY TO THE GROIN FOR THREE WEEKS Patient not taking: Reported on 03/05/2018 02/06/18   Ladell Pier, MD  glucose blood (TRUE METRIX BLOOD GLUCOSE TEST) test strip Check blood sugars 1-2 times  daily 01/17/18   Ladell Pier, MD  Lancets Misc. (ACCU-CHEK SOFTCLIX LANCET DEV) KIT Use as directed 12/19/17   Ladell Pier, MD  predniSONE (DELTASONE) 10 MG tablet Take 1.5 tablets (15 mg total) by mouth daily with breakfast. Patient not taking: Reported on 03/05/2018 12/18/17   Ladell Pier, MD  sildenafil (VIAGRA) 50 MG tablet 1-2 tabs 1 hr prior to intercourse PRN.  Limit use to 2 tabs/24 hrs Patient not taking: Reported on 03/05/2018 12/18/17   Ladell Pier, MD  triamcinolone cream (KENALOG) 0.1 % Apply 1 application topically 2 (two) times daily. Patient not taking: Reported on 03/05/2018 01/17/18   Ladell Pier, MD  TRUEPLUS LANCETS 28G MISC Use as directed 01/17/18   Ladell Pier, MD    Physical Exam: Vitals:   03/04/18 2122 03/05/18 0028 03/05/18 0602 03/05/18 0603  BP:  (!) 160/98 (!) 165/97 (!) 158/83  Pulse:  (!) 58  71  Resp:  14  17  Temp:    98.3 F (36.8 C)  TempSrc:    Oral  SpO2:  100%  100%  Weight: 122.5 kg     Height: '6\' 3"'  (1.905 m)         Constitutional: Moderately built and nourished. Vitals:   03/04/18 2122 03/05/18 0028 03/05/18 0602 03/05/18 0603  BP:  (!) 160/98 (!)  165/97 (!) 158/83  Pulse:  (!) 58  71  Resp:  14  17  Temp:    98.3 F (36.8 C)  TempSrc:    Oral  SpO2:  100%  100%  Weight: 122.5 kg     Height: '6\' 3"'  (1.905 m)      Eyes: Anicteric no pallor. ENMT: No discharge from the ears eyes nose or mouth. Neck: No mass felt.  No neck rigidity.  No JVD appreciated. Respiratory: No rhonchi or crepitations. Cardiovascular: S1-S2 heard no murmurs appreciated. Abdomen: Soft nontender bowel sounds present. Musculoskeletal: Bilateral lower extremity edema present. Skin: No rash. Neurologic: Alert awake oriented to time place and person.  Moves both upper extremities 5 x 5.  No pronator drift.  Left lower extremity is mildly weak.  No facial asymmetry.  Tongue is midline. Psychiatric: Appears normal per normal affect.   Labs on Admission: I have personally reviewed following labs and imaging studies  CBC: Recent Labs  Lab 03/04/18 2144  WBC 7.9  HGB 13.2  HCT 42.0  MCV 87.9  PLT 242   Basic Metabolic Panel: Recent Labs  Lab 03/04/18 2144  NA 142  K 4.6  CL 109  CO2 25  GLUCOSE 161*  BUN 29*  CREATININE 1.53*  CALCIUM 9.2   GFR: Estimated Creatinine Clearance: 73.3 mL/min (A) (by C-G formula based on SCr of 1.53 mg/dL (H)). Liver Function Tests: No results for input(s): AST, ALT, ALKPHOS, BILITOT, PROT, ALBUMIN in the last 168 hours. No results for input(s): LIPASE, AMYLASE in the last 168 hours. No results for input(s): AMMONIA in the last 168 hours. Coagulation Profile: No results for input(s): INR, PROTIME in the last 168 hours. Cardiac Enzymes: No results for input(s): CKTOTAL, CKMB, CKMBINDEX, TROPONINI in the last 168 hours. BNP (last 3 results) No results for input(s): PROBNP in the last 8760 hours. HbA1C: No results for input(s): HGBA1C in the last 72 hours. CBG: No results for input(s): GLUCAP in the last 168 hours. Lipid Profile: No results for input(s): CHOL, HDL, LDLCALC, TRIG, CHOLHDL, LDLDIRECT in the last  72 hours. Thyroid Function Tests: No  results for input(s): TSH, T4TOTAL, FREET4, T3FREE, THYROIDAB in the last 72 hours. Anemia Panel: No results for input(s): VITAMINB12, FOLATE, FERRITIN, TIBC, IRON, RETICCTPCT in the last 72 hours. Urine analysis:    Component Value Date/Time   COLORURINE YELLOW 09/30/2017 1652   APPEARANCEUR CLEAR 09/30/2017 1652   LABSPEC 1.023 09/30/2017 1652   PHURINE 5.0 09/30/2017 1652   GLUCOSEU NEGATIVE 09/30/2017 1652   HGBUR NEGATIVE 09/30/2017 1652   BILIRUBINUR NEGATIVE 09/30/2017 1652   KETONESUR 5 (A) 09/30/2017 1652   PROTEINUR 30 (A) 09/30/2017 1652   UROBILINOGEN 0.2 04/27/2015 1008   NITRITE NEGATIVE 09/30/2017 1652   LEUKOCYTESUR NEGATIVE 09/30/2017 1652   Sepsis Labs: '@LABRCNTIP' (procalcitonin:4,lacticidven:4) )No results found for this or any previous visit (from the past 240 hour(s)).   Radiological Exams on Admission: Ct Head Wo Contrast  Result Date: 03/04/2018 CLINICAL DATA:  Right arm and leg feel weird. Symptoms beginning at 1930 hours today. Previous back injury affecting the left side. EXAM: CT HEAD WITHOUT CONTRAST TECHNIQUE: Contiguous axial images were obtained from the base of the skull through the vertex without intravenous contrast. COMPARISON:  01/23/2016 FINDINGS: Brain: Mild diffuse cerebral atrophy. Ventricular dilatation is likely due to central atrophy. Normal pressure hydrocephalus would be a less likely consideration. Patchy white matter changes likely representing small vessel ischemia. No change since prior study. No mass-effect or midline shift. No abnormal extra-axial fluid collections. Gray-white matter junctions are distinct. Basal cisterns are not effaced. No acute intracranial hemorrhage. Vascular: Intracranial arterial vascular calcifications are present. Skull: Calvarium appears intact. Sinuses/Orbits: Clear. Other: None. IMPRESSION: No acute intracranial abnormalities. Mild diffuse cerebral atrophy and small vessel  ischemic changes. Ventricular dilatation is likely due to central atrophy. Electronically Signed   By: Lucienne Capers M.D.   On: 03/04/2018 23:19   Mr Brain Wo Contrast  Result Date: 03/05/2018 CLINICAL DATA:  57 y/o M; slurred speech, stuttering, right arm weakness since yesterday. EXAM: MRI HEAD WITHOUT CONTRAST TECHNIQUE: Multiplanar, multiecho pulse sequences of the brain and surrounding structures were obtained without intravenous contrast. COMPARISON:  03/04/2018 CT head.  04/27/2015 MRI head. FINDINGS: Brain: No acute infarction, hemorrhage, hydrocephalus, extra-axial collection or mass lesion. Stable ventricle size. Moderate diffuse volume loss of the brain. Absent septum pellucidum. Vascular: Normal flow voids. Skull and upper cervical spine: Normal marrow signal. Sinuses/Orbits: Negative. Other: Multiple dermal cyst, likely sebaceous cyst. IMPRESSION: 1. No acute intracranial abnormality. 2. Stable moderate diffuse volume loss of the brain and absent septum pellucidum. Electronically Signed   By: Kristine Garbe M.D.   On: 03/05/2018 05:00    EKG: Independently reviewed.  Normal sinus rhythm with diffuse ST-T changes mostly in the inferior leads.  Assessment/Plan Principal Problem:   TIA (transient ischemic attack) Active Problems:   Essential hypertension   Cocaine abuse (HCC)   Alcoholism /alcohol abuse (HCC)   Decreased hearing of both ears   Diabetes mellitus type 2 with complications (HCC)   Edema of both legs    1. TIA -appreciate neurology consult.  Patient passed swallow.  Will keep patient on neurochecks.  Check MRA brain 2D echo carotid Doppler hemoglobin A1c lipid panel.  Patient's Lipitor dose increased to 80 mg as recommended by neurology.  Aspirin.  Get physical therapy consult.  Urine drug screen pending. 2. Abnormal EKG changes in the inferior and lateral leads.  Patient denies any chest pain.  We will cycle cardiac markers check 2D echo. 3. Hypertension  allow for permissive hypertension.  Patient is on labetalol and lisinopril.  Will hold lisinopril due to worsening renal function. 4. History of bilateral lower extremity edema chronic hold Lasix due to worsening renal function and also possible acute strokelike symptoms. 5. History of hearing loss. 6. History of cocaine abuse and alcohol abuse -patient states he has not had any cocaine alcohol since December. 7. Diabetes mellitus type 2 we will keep patient on sliding scale coverage. 8. Chronic diastolic CHF holding Lasix due to worsening renal function.   DVT prophylaxis: Lovenox. Code Status: Full code. Family Communication: Discussed with patient. Disposition Plan: Home. Consults called: Neurology. Admission status: Observation.   Rise Patience MD [facility] Pager [phone].  If 7PM-7AM, please contact night-coverage [email or web address] Password [facility]  03/05/2018, 7:16 AM

## 2018-03-05 NOTE — Care Management Note (Signed)
Case Management Note  Patient Details  Name: [name] MRN: [id number] Date of Birth: 05/10/1961  Subjective/Objective:     Pt in with TIA. He is from home alone. Pt states he has an aide 2.5 hours a day/ 7 days a week. Pt uses [employer] for his transportation needs. [employer] delivers his meds to his home.  Pt has cane, walker and shower chair.             Action/Plan: PT recommending [place] services. CM awaiting MD orders.   Contact is sister: Dorena Cookey:: [phone]  Expected Discharge Date:                  Expected Discharge Plan:  [place]  In-House Referral:     Discharge planning Services  CM Consult  Post Acute Care Choice:    Choice offered to:     DME Arranged:    DME Agency:     HH Arranged:    Industry Agency:     Status of Service:  In process, will continue to follow  If discussed at Long Length of Stay Meetings, dates discussed:    Additional Comments:  Pollie Friar, RN 03/05/2018, 1:17 PM

## 2018-03-05 NOTE — Progress Notes (Signed)
Carotid artery duplex has been completed. 1-39% ICA stenosis bilaterally.  03/05/18 10:48 AM [name] RVT

## 2018-03-05 NOTE — Progress Notes (Signed)
  Echocardiogram 2D Echocardiogram has been performed with [facility].  [name] 03/05/2018, 2:38 PM

## 2018-03-05 NOTE — ED Provider Notes (Signed)
[facility] EMERGENCY DEPARTMENT Provider Note   CSN: [id number] Arrival date & time: 03/04/18  2112     History   Chief Complaint Chief Complaint  Patient presents with  . Right extremities feel weird    HPI [name] is a 57 y.o. male.  The history is provided by the patient.  Extremity Weakness  This is a recurrent problem. The current episode started 6 to 12 hours ago (started at 730 pm). The problem occurs rarely. The problem has been rapidly improving. Pertinent negatives include no chest pain, no abdominal pain, no headaches and no shortness of breath. Nothing aggravates the symptoms. Nothing relieves the symptoms. He has tried nothing for the symptoms. The treatment provided significant relief.  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  Felt RUE was weak but improved since arriving at triage and that he was have RLE "jumping" but this has also resolved during his time in the waiting room.    Past Medical History:  Diagnosis Date  . Alcohol abuse    11-15-2017  per pt last alcohol Dec 2018  . BPH with obstruction/lower urinary tract symptoms   . Chronic arm pain   . Chronic pain    arms, leg, back  . CKD (chronic kidney disease), stage II   . Cocaine abuse ([place])    11-15-2017  per pt last used March 2019  . Congenital hydrocephalus, unspecified (HCC)    slow progression  . Diabetic retinopathy of both eyes ([place])   . Diastolic CHF, chronic ([place]) 07/2017  . Gait instability    multifactorial -- slow worsening congenital hydrocephalus, peripheral neuropathy, left C5-6 cord lesion  . Hematuria   . History of acute pulmonary edema 07/2017  . History of spinal cord injury 01/02/2014   pt fell, caused spinal cord contusion at C5-6--  residual left side weakness    . 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  History of TIA (transient ischemic attack) and stroke    hx cva 12-20-2010 and  TIA 02-22-2011--- no residual's from cva or tia  . Hyperlipidemia   . Hypertension    followed by pcp  . 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 Lesion of bladder   . Peripheral edema    chronic LUE  . Peripheral neuropathy   . Type 2 diabetes mellitus ([place])    followed by dr Karle Plumber--  last A1c 6.8 on 11-06-2017  . Weak urinary stream     Patient Active Problem List   Diagnosis Date Noted  . Sensorineural hearing loss of both ears 12/04/2017  . Acute diastolic congestive heart failure ([place])   . Edema of both legs 05/21/2017  . Mild nonproliferative diabetic retinopathy ([place]) 01/10/2016  . Hypertensive retinopathy of both eyes 01/10/2016  . Spinal stenosis of lumbar region 07/15/2015  . Abnormality of gait 06/07/2015  . Hydrocephalus 06/07/2015  . Peripheral neuropathy 04/26/2015  . Constipation 04/26/2015  . Diabetes mellitus type 2 with complications ([place]) 70/14/1030  . Decreased hearing of both ears 03/15/2015  . Tinea pedis 03/15/2015  . Callus of foot 05/22/2014  . Contusion of cervical cord ([place]) 05/22/2014  . Coronary vasospasm, cocaine related 12/29/2013  . Cocaine abuse ([place]) 12/28/2013  . Alcoholism /alcohol abuse ([place]) 12/28/2013  . Left-sided weakness 12/26/2013  . Hyperlipidemia associated with type  2 diabetes mellitus ([place]) 04/01/2007  . OBESITY NOS 03/28/2007  . Essential hypertension 03/28/2007    Past Surgical History:  Procedure Laterality Date  . CARDIAC CATHETERIZATION  12/01/2008   dr hochrein   abnormal stress myoview:  mild coronary plaque, normal LVF  . CARDIAC CATHETERIZATION  04/29/2011   dr [place]   in setting ECG with new ST elevation & severe hypertensive:  nonobstructive atherosclerotic CAD, normal LVF (30% mRCA)   . CARDIOVASCULAR STRESS TEST  08/15/2010   Low nuclear study w/ no evidence ishemia/  ef 42% with lateral and  apical hypokinesis  . CYSTOSCOPY WITH BIOPSY N/A 11/20/2017   Procedure: CYSTOSCOPY WITH BIOPSY AND FULGURATION;  Surgeon: [name], MD;  [facility];  Service: Urology;  Laterality: N/A;  . INCISION AND DRAINAGE RIGHT DISTAL MEDIAL THIGH HEMATOMA  04-14-2005   dr Ninfa Linden  . left arm skin graft  1976   injury  . LEFT HEART CATHETERIZATION WITH CORONARY ANGIOGRAM Bilateral 12/26/2013   Procedure: LEFT HEART CATHETERIZATION WITH CORONARY ANGIOGRAM;  Surgeon: Burnell Blanks, MD;  Location: [facility] CATH LAB;  Service: Cardiovascular;  Laterality: Bilateral;  STEMI, in setting cocaine/ alcohol :  mid disease in the RCA (20%), moderate disease in the small caliber intermediate branch (distal 50-60%), normal LVSF (ef 55-60%)  . TRANSTHORACIC ECHOCARDIOGRAM  08/05/2017   moderate LVH,  ef 55%,  grade 1 diastolic dysfunction/  mild LAE/  trivial Tr        Home Medications    Prior to Admission medications   Medication Sig Start Date End Date Taking? Authorizing Provider  atorvastatin (LIPITOR) 40 MG tablet Take 1 tablet (40 mg total) by mouth every morning. Patient taking differently: Take 40 mg by mouth daily.  02/06/18  Yes Ladell Pier, MD  cetirizine (ZYRTEC) 10 MG tablet Take 1 tablet (10 mg total) by mouth daily. 09/26/17  Yes Argentina Donovan, PA-C  furosemide (LASIX) 40 MG tablet Take 1 tablet (40 mg total) by mouth every morning. Patient taking differently: Take 40 mg by mouth daily.  12/18/17  Yes Ladell Pier, MD  gabapentin (NEURONTIN) 300 MG capsule TAKE 1 CAPSULE (300 MG TOTAL) BY MOUTH THREE TIMES DAILY. 02/22/17  Yes Funches, Adriana Mccallum, MD  glipiZIDE (GLIPIZIDE XL) 10 MG 24 hr tablet Take 1 tablet (10 mg total) by mouth daily with breakfast. 02/06/18  Yes Ladell Pier, MD  labetalol (NORMODYNE) 100 MG tablet Take 0.5 tablets (50 mg total) by mouth 2 (two) times daily. Patient taking differently: Take 50 mg by mouth 2 (two) times daily.  11/06/17   Yes Ladell Pier, MD  lisinopril (PRINIVIL,ZESTRIL) 40 MG tablet Take 1 tablet (40 mg total) by mouth daily. Patient taking differently: Take 40 mg by mouth daily.  08/03/17  Yes Ladell Pier, MD  metFORMIN (GLUCOPHAGE) 1000 MG tablet Take 1 tablet (1,000 mg total) by mouth 2 (two) times daily with a meal. 02/06/18  Yes Ladell Pier, MD  aspirin EC 81 MG tablet Take 1 tablet (81 mg total) by mouth daily. Patient not taking: Reported on 03/05/2018 03/13/17   Ladell Pier, MD  Blood Glucose Monitoring Suppl (TRUE METRIX METER) w/Device KIT Use as directed 01/17/18   Ladell Pier, MD  clotrimazole (LOTRIMIN) 1 % cream Apply to groin area 2 times daily Patient not taking: Reported on 03/05/2018 09/30/17   Glyn Ade, PA-C  clotrimazole-betamethasone (LOTRISONE) cream APPLY 1 APPLICATION TWICE A DAY TO THE GROIN  FOR THREE WEEKS Patient not taking: Reported on 03/05/2018 02/06/18   Ladell Pier, MD  glucose blood (TRUE METRIX BLOOD GLUCOSE TEST) test strip Check blood sugars 1-2 times daily 01/17/18   Ladell Pier, MD  Lancets Misc. (ACCU-CHEK SOFTCLIX LANCET DEV) KIT Use as directed 12/19/17   Ladell Pier, MD  predniSONE (DELTASONE) 10 MG tablet Take 1.5 tablets (15 mg total) by mouth daily with breakfast. Patient not taking: Reported on 03/05/2018 12/18/17   Ladell Pier, MD  sildenafil (VIAGRA) 50 MG tablet 1-2 tabs 1 hr prior to intercourse PRN.  Limit use to 2 tabs/24 hrs Patient not taking: Reported on 03/05/2018 12/18/17   Ladell Pier, MD  triamcinolone cream (KENALOG) 0.1 % Apply 1 application topically 2 (two) times daily. Patient not taking: Reported on 03/05/2018 01/17/18   Ladell Pier, MD  TRUEPLUS LANCETS 28G MISC Use as directed 01/17/18   Ladell Pier, MD    Family History Family History  Problem Relation Age of Onset  . Hypertension Mother   . Diabetes Mother   . Cancer Mother        breast cancer   .  Hypertension Father   . Heart disease Father   . Stroke Father   . Hypertension Sister     Social History Social History   Tobacco Use  . Smoking status: Current Every Day Smoker    Years: 45.00    Types: Cigarettes  . Smokeless tobacco: Never Used  . Tobacco comment: 11-15-2017  per pt 1pp3days  Substance Use Topics  . Alcohol use: Not Currently    Alcohol/week: 0.0 standard drinks    Comment: 11-15-2017  per pt last alcohol 12/ 2018  alcohol abuse  . Drug use: Yes    Types: "Crack" cocaine, Cocaine    Comment: 11-15-2017  per pt last used March 2019     Allergies   Amlodipine   Review of Systems Review of Systems  Constitutional: Negative for diaphoresis and fever.  Eyes: Negative for visual disturbance.  Respiratory: Negative for shortness of breath.   Cardiovascular: Negative for chest pain, palpitations and leg swelling.  Gastrointestinal: Positive for vomiting. Negative for abdominal pain.  Musculoskeletal: Positive for extremity weakness. Negative for back pain.  Neurological: Positive for tremors and weakness. Negative for dizziness, seizures, facial asymmetry, speech difficulty, light-headedness, numbness and headaches.  All other systems reviewed and are negative.    Physical Exam Updated Vital Signs BP (!) 160/98 (BP Location: Right Leg)   Pulse (!) 58   Temp 98 F (36.7 C) (Oral)   Resp 14   Ht '6\' 3"'  (1.905 m)   Wt 122.5 kg   SpO2 100%   BMI 33.75 kg/m   Physical Exam  Constitutional: He is oriented to person, place, and time. He appears well-developed and well-nourished. No distress.  HENT:  Head: Normocephalic and atraumatic.  Nose: Nose normal.  Mouth/Throat: Oropharynx is clear and moist. No oropharyngeal exudate.  Eyes: Pupils are equal, round, and reactive to light. Conjunctivae and EOM are normal.  Neck: Normal range of motion. Neck supple.  Cardiovascular: Normal rate, regular rhythm, normal heart sounds and intact distal pulses.    Pulmonary/Chest: Effort normal and breath sounds normal. No stridor. He has no wheezes. He has no rales.  Abdominal: Soft. Bowel sounds are normal. He exhibits no mass. There is no tenderness. There is no rebound and no guarding.  Musculoskeletal: Normal range of motion. He exhibits no tenderness.  Neurological:  He is alert and oriented to person, place, and time. He displays normal reflexes. No cranial nerve deficit.  4+ throughout with tremor  Skin: Skin is warm and dry. Capillary refill takes less than 2 seconds.  Psychiatric: He has a normal mood and affect.     ED Treatments / Results  Labs (all labs ordered are listed, but only abnormal results are displayed) Results for orders placed or performed during the hospital encounter of 85/63/14  Basic metabolic panel  Result Value Ref Range   Sodium 142 135 - 145 mmol/L   Potassium 4.6 3.5 - 5.1 mmol/L   Chloride 109 98 - 111 mmol/L   CO2 25 22 - 32 mmol/L   Glucose, Bld 161 (H) 70 - 99 mg/dL   BUN 29 (H) 6 - 20 mg/dL   Creatinine, Ser 1.53 (H) 0.61 - 1.24 mg/dL   Calcium 9.2 8.9 - 10.3 mg/dL   GFR calc non Af Amer 48 (L) >60 mL/min   GFR calc Af Amer 56 (L) >60 mL/min   Anion gap 8 5 - 15  CBC  Result Value Ref Range   WBC 7.9 4.0 - 10.5 K/uL   RBC 4.78 4.22 - 5.81 MIL/uL   Hemoglobin 13.2 13.0 - 17.0 g/dL   HCT 42.0 39.0 - 52.0 %   MCV 87.9 78.0 - 100.0 fL   MCH 27.6 26.0 - 34.0 pg   MCHC 31.4 30.0 - 36.0 g/dL   RDW 13.3 11.5 - 15.5 %   Platelets 211 150 - 400 K/uL  I-stat troponin, ED  Result Value Ref Range   Troponin i, poc 0.01 0.00 - 0.08 ng/mL   Comment 3           Ct Head Wo Contrast  Result Date: 03/04/2018 CLINICAL DATA:  Right arm and leg feel weird. Symptoms beginning at 1930 hours today. Previous back injury affecting the left side. EXAM: CT HEAD WITHOUT CONTRAST TECHNIQUE: Contiguous axial images were obtained from the base of the skull through the vertex without intravenous contrast. COMPARISON:   01/23/2016 FINDINGS: Brain: Mild diffuse cerebral atrophy. Ventricular dilatation is likely due to central atrophy. Normal pressure hydrocephalus would be a less likely consideration. Patchy white matter changes likely representing small vessel ischemia. No change since prior study. No mass-effect or midline shift. No abnormal extra-axial fluid collections. Gray-white matter junctions are distinct. Basal cisterns are not effaced. No acute intracranial hemorrhage. Vascular: Intracranial arterial vascular calcifications are present. Skull: Calvarium appears intact. Sinuses/Orbits: Clear. Other: None. IMPRESSION: No acute intracranial abnormalities. Mild diffuse cerebral atrophy and small vessel ischemic changes. Ventricular dilatation is likely due to central atrophy. Electronically Signed   By: Lucienne Capers M.D.   On: 03/04/2018 23:19   Mr Brain Wo Contrast  Result Date: 03/05/2018 CLINICAL DATA:  57 y/o M; slurred speech, stuttering, right arm weakness since yesterday. EXAM: MRI HEAD WITHOUT CONTRAST TECHNIQUE: Multiplanar, multiecho pulse sequences of the brain and surrounding structures were obtained without intravenous contrast. COMPARISON:  03/04/2018 CT head.  04/27/2015 MRI head. FINDINGS: Brain: No acute infarction, hemorrhage, hydrocephalus, extra-axial collection or mass lesion. Stable ventricle size. Moderate diffuse volume loss of the brain. Absent septum pellucidum. Vascular: Normal flow voids. Skull and upper cervical spine: Normal marrow signal. Sinuses/Orbits: Negative. Other: Multiple dermal cyst, likely sebaceous cyst. IMPRESSION: 1. No acute intracranial abnormality. 2. Stable moderate diffuse volume loss of the brain and absent septum pellucidum. Electronically Signed   By: Kristine Garbe M.D.   On: 03/05/2018 05:00  EKG EKG Interpretation  Date/Time:  Tuesday March 05 2018 00:45:54 EDT Ventricular Rate:  58 PR Interval:  174 QRS Duration: 122 QT Interval:  414 QTC  Calculation: 406 R Axis:   -62 Text Interpretation:  Sinus bradycardia Left anterior fascicular block Left ventricular hypertrophy with QRS widening ST elevation, consider early repolarization, pericarditis, or injury Confirmed by Randal Buba, Traeson Dusza ([id number]) on 03/05/2018 2:07:24 AM   Radiology Ct Head Wo Contrast  Result Date: 03/04/2018 CLINICAL DATA:  Right arm and leg feel weird. Symptoms beginning at 1930 hours today. Previous back injury affecting the left side. EXAM: CT HEAD WITHOUT CONTRAST TECHNIQUE: Contiguous axial images were obtained from the base of the skull through the vertex without intravenous contrast. COMPARISON:  01/23/2016 FINDINGS: Brain: Mild diffuse cerebral atrophy. Ventricular dilatation is likely due to central atrophy. Normal pressure hydrocephalus would be a less likely consideration. Patchy white matter changes likely representing small vessel ischemia. No change since prior study. No mass-effect or midline shift. No abnormal extra-axial fluid collections. Gray-white matter junctions are distinct. Basal cisterns are not effaced. No acute intracranial hemorrhage. Vascular: Intracranial arterial vascular calcifications are present. Skull: Calvarium appears intact. Sinuses/Orbits: Clear. Other: None. IMPRESSION: No acute intracranial abnormalities. Mild diffuse cerebral atrophy and small vessel ischemic changes. Ventricular dilatation is likely due to central atrophy. Electronically Signed   By: Lucienne Capers M.D.   On: 03/04/2018 23:19   Mr Brain Wo Contrast  Result Date: 03/05/2018 CLINICAL DATA:  57 y/o M; slurred speech, stuttering, right arm weakness since yesterday. EXAM: MRI HEAD WITHOUT CONTRAST TECHNIQUE: Multiplanar, multiecho pulse sequences of the brain and surrounding structures were obtained without intravenous contrast. COMPARISON:  03/04/2018 CT head.  04/27/2015 MRI head. FINDINGS: Brain: No acute infarction, hemorrhage, hydrocephalus, extra-axial collection or  mass lesion. Stable ventricle size. Moderate diffuse volume loss of the brain. Absent septum pellucidum. Vascular: Normal flow voids. Skull and upper cervical spine: Normal marrow signal. Sinuses/Orbits: Negative. Other: Multiple dermal cyst, likely sebaceous cyst. IMPRESSION: 1. No acute intracranial abnormality. 2. Stable moderate diffuse volume loss of the brain and absent septum pellucidum. Electronically Signed   By: Kristine Garbe M.D.   On: 03/05/2018 05:00    Procedures Procedures (including critical care time)  Medications Ordered in ED Medications  LORazepam (ATIVAN) injection 1 mg (1 mg Intravenous Given 03/05/18 0321)      Final Clinical Impressions(s) / ED Diagnoses   Will admit for the neuro work up per Dr. Larey Seat, Iam Lipson, MD 03/05/18 [phone]

## 2018-03-06 DIAGNOSIS — I10 Essential (primary) hypertension: Secondary | ICD-10-CM

## 2018-03-06 DIAGNOSIS — F1410 Cocaine abuse, uncomplicated: Secondary | ICD-10-CM | POA: Diagnosis not present

## 2018-03-06 DIAGNOSIS — E118 Type 2 diabetes mellitus with unspecified complications: Secondary | ICD-10-CM | POA: Diagnosis not present

## 2018-03-06 DIAGNOSIS — G459 Transient cerebral ischemic attack, unspecified: Secondary | ICD-10-CM | POA: Diagnosis not present

## 2018-03-06 LAB — GLUCOSE, CAPILLARY
GLUCOSE-CAPILLARY: 182 mg/dL — AB (ref 70–99)
Glucose-Capillary: 147 mg/dL — ABNORMAL HIGH (ref 70–99)

## 2018-03-06 MED ORDER — ATORVASTATIN CALCIUM 80 MG PO TABS
80.0000 mg | ORAL_TABLET | Freq: Every day | ORAL | 0 refills | Status: DC
Start: 2018-03-07 — End: 2018-11-20

## 2018-03-06 MED ORDER — GABAPENTIN 300 MG PO CAPS: ORAL_CAPSULE | ORAL | 3 refills | Status: DC

## 2018-03-06 NOTE — Consult Note (Signed)
            [facility] CM Primary Care Navigator  03/06/2018  [name] Jan 22, 1961 [id number]   Went to seepatient at the bedside to identify possible discharge needs buthe wasjust discharged per staff. Patient was discharged home with home health services.  Per MD note, patient presented with right sided weakness that has resolved and MRI showing no acute stroke. Stroke workup unrevealing.  Patient has discharge instruction to follow-up withprimary care provider in 1 week.   For additional questions please contact:  Edwena Felty A. Lamya Lausch, BSN, RN-BC [facility] PRIMARY CARE Navigator Cell: [phone]

## 2018-03-06 NOTE — Evaluation (Signed)
Occupational Therapy Evaluation Patient Details Name: [name] MRN: [id number] DOB: 12-04-1960 Today's Date: 03/06/2018    History of Present Illness 57 y.o. male with history of diabetes mellitus type 2, diastolic dysfunction, hypertension, alcohol and cocaine abuse previously with history of left-sided weakness from spinal cord injury started experiencing right-sided weakness. Neuro work up underway   Clinical Impression   PT admitted with L sided weakness with pending workup. Pt currently with functional limitiations due to the deficits listed below (see OT problem list). Pt demonstrates balance deficits at this time with adls.  Pt will benefit from skilled OT to increase their independence and safety with adls and balance to allow discharge hhot.     Follow Up Recommendations  Home health OT    Equipment Recommendations  None recommended by OT    Recommendations for Other Services       Precautions / Restrictions Precautions Precautions: Fall      Mobility Bed Mobility Overal bed mobility: Needs Assistance Bed Mobility: Supine to Sit     Supine to sit: Modified independent (Device/Increase time)        Transfers Overall transfer level: Needs assistance Equipment used: Quad cane Transfers: Sit to/from Stand Sit to Stand: Supervision;Min guard              Balance Overall balance assessment: Needs assistance Sitting-balance support: Feet supported Sitting balance-Leahy Scale: Good       Standing balance-Leahy Scale: Fair                 High Level Balance Comments: min guard for safety, some instability noted. No physical assist required.            ADL either performed or assessed with clinical judgement   ADL Overall ADL's : Needs assistance/impaired Eating/Feeding: Set up Eating/Feeding Details (indicate cue type and reason): pt requires (A) To open items. pt reports baseline the aids leave food open for him . pt reports I put it  in my L elbow or under arm if i need to open it alone Grooming: Wash/dry Tree surgeon: Copy Details (indicate cue type and reason): pt noted to require the use of bil le          Functional mobility during ADLs: Min guard;Cane General ADL Comments: pt noted to have a full body tremor/ shake with every 3 step to his gait and stopping to pause collect balance and continue. pt states his only concern is this new tremor/ shake to his "walking" pt holding and using quad cane appropriately     Vision Baseline Vision/History: No visual deficits       Perception     Praxis      Pertinent Vitals/Pain Pain Assessment: No/denies pain     Hand Dominance Right   Extremity/Trunk Assessment Upper Extremity Assessment Upper Extremity Assessment: LUE deficits/detail LUE Deficits / Details: grasp 4 out 5 , pt reports L UE baseline decr use. pt reports x4 years of decr use of L UE. shoulder flexion 90 degrees full elbow extension full digit flexion/ extension / abduction       Cervical / Trunk Assessment Cervical / Trunk Assessment: Kyphotic   Communication Communication Communication: No difficulties   Cognition Arousal/Alertness: Awake/alert Behavior During Therapy: Flat affect Overall Cognitive Status: Impaired/Different from baseline  General Comments: pt with delayed response to questions but able to respond with incr time. pt flat affect but completed basic adl task appropriately   General Comments  pt noted to have blood on a wrap and when asked "thats from the bumps on my head"    Exercises     Shoulder Instructions      Home Living Family/patient expects to be discharged to:: Private residence Living Arrangements: Alone Available Help at Discharge: Family;Friend(s);Available PRN/intermittently Type of Home: Apartment Home Access: Level  entry     Home Layout: One level     Bathroom Shower/Tub: Teacher, early years/pre: Standard     Home Equipment: Environmental consultant - 2 wheels;Cane - single point;Cane - quad   Additional Comments: patient has an aide that comes every day for 2 hours a day      Prior Functioning/Environment Level of Independence: Independent with assistive device(s)        Comments: utilizes a quad cane for mobility        OT Problem List: Decreased activity tolerance;Decreased coordination;Decreased knowledge of use of DME or AE;Decreased range of motion;Decreased strength;Impaired balance (sitting and/or standing);Impaired UE functional use;Obesity      OT Treatment/Interventions: Self-care/ADL training;Therapeutic activities;Therapeutic exercise;Neuromuscular education;DME and/or AE instruction;Manual therapy;Patient/family education;Balance training    OT Goals(Current goals can be found in the care plan section) Acute Rehab OT Goals Patient Stated Goal: to go home OT Goal Formulation: With patient Time For Goal Achievement: 03/19/18 Potential to Achieve Goals: Good  OT Frequency: Min 2X/week   Barriers to D/C: Decreased caregiver support          Co-evaluation              AM-PAC PT "6 Clicks" Daily Activity     Outcome Measure Help from another person eating meals?: A Little Help from another person taking care of personal grooming?: A Little Help from another person toileting, which includes using toliet, bedpan, or urinal?: A Little Help from another person bathing (including washing, rinsing, drying)?: A Little Help from another person to put on and taking off regular upper body clothing?: A Little Help from another person to put on and taking off regular lower body clothing?: A Lot 6 Click Score: 17   End of Session Equipment Utilized During Treatment: Other (comment)(cane) Nurse Communication: Mobility status;Precautions  Activity Tolerance: Patient tolerated  treatment well Patient left: in chair;with chair alarm set;with call bell/phone within reach  OT Visit Diagnosis: Unsteadiness on feet (R26.81);Muscle weakness (generalized) (M62.81)                Time: 0045-9977 OT Time Calculation (min): 16 min Charges:  OT General Charges $OT Visit: 1 Visit OT Evaluation $OT Eval Moderate Complexity: 1 Mod   Jeri Modena   OTR/L Pager: [phone] Office: [phone] .   Parke Poisson B 03/06/2018, 9:36 AM

## 2018-03-06 NOTE — Discharge Summary (Signed)
Physician Discharge Summary  [name] EXN:[id number] DOB: 04-22-1961 DOA: 03/04/2018  PCP: Ladell Pier, MD  Admit date: 03/04/2018 Discharge date: 03/06/2018  Admitted From: home Discharge disposition: home   Recommendations for Outpatient Follow-Up:   1. Tobacco cessation 2. Home health 3. Follow up with Dr. Krista Blue   Discharge Diagnosis:   Principal Problem:   TIA (transient ischemic attack) Active Problems:   Hyperlipidemia associated with type 2 diabetes mellitus ([place])   Essential hypertension   Cocaine abuse ([place])   Alcoholism /alcohol abuse ([place])   Decreased hearing of both ears   Diabetes mellitus type 2 with complications ([place])   Benign essential HTN    Discharge Condition: Improved.  Diet recommendation: Low sodium, heart healthy.  Carbohydrate-modified  Wound care: None.  Code status: Full.   History of Present Illness:   [name] is a 57 y.o. male with history of diabetes mellitus type 2, diastolic dysfunction, hypertension, alcohol and cocaine abuse previously with history of left-sided weakness from spinal cord injury started experiencing right-sided weakness around 7:30 PM last evening while watching TV at home.  Patient states that he had difficulty moving his right upper extremity against gravity.  Denies any difficulty with speaking swallowing or any visual symptoms.  Symptoms persisted until he came to the ER.   Hospital Course by Problem:   TIA vs. Conversion disorder   Diagnosis not clear: MRI  No acute stroke. Atrophy. Absent septum pellucidum  Carotid Doppler  B ICA 1-39% stenosis, VAs antegrade   2D Echo  EF 65-70%, no SOE  LDL 98 (statin-- increased home dose from 40 to 80)  HgbA1c 7.0  Reported not taking aspirin 81 mg daily routinely prior to admission, now on aspirin 325 mg daily. Resume home dose of 81 mg and continue at d/c.   Home health PT/OT  Hypertension  Resume home  meds  Hyperlipidemia  Home meds:  lipitor 40 - states he was taking regularly  Increased to Lipitor 80 mg in hospital  LDL 98, goal < 70  Diabetes type 2 w/ PN  HgbA1c 7.0, goal < 7.0  Continue home meds  Obesity Body mass index is 33.75 kg/m.   Tobacco abuse -encourage cessation   Medical Consultants:    neuro  Discharge Exam:   Vitals:   03/06/18 0804 03/06/18 1143  BP: (!) 139/56 (!) 144/61  Pulse: (!) 57 63  Resp: 11 11  Temp: 98.3 F (36.8 C) 97.9 F (36.6 C)  SpO2: 97% 95%   Vitals:   03/05/18 2353 03/06/18 0320 03/06/18 0804 03/06/18 1143  BP: (!) 119/50 (!) 121/59 (!) 139/56 (!) 144/61  Pulse:   (!) 57 63  Resp:   11 11  Temp: 97.7 F (36.5 C) 98 F (36.7 C) 98.3 F (36.8 C) 97.9 F (36.6 C)  TempSrc: Oral Oral Oral Oral  SpO2: 97%  97% 95%  Weight:      Height:        General exam: Appears calm and comfortable.    The results of significant diagnostics from this hospitalization (including imaging, microbiology, ancillary and laboratory) are listed below for reference.     Procedures and Diagnostic Studies:   Ct Head Wo Contrast  Result Date: 03/04/2018 CLINICAL DATA:  Right arm and leg feel weird. Symptoms beginning at 1930 hours today. Previous back injury affecting the left side. EXAM: CT HEAD WITHOUT CONTRAST TECHNIQUE: Contiguous axial images were obtained from the base of the  skull through the vertex without intravenous contrast. COMPARISON:  01/23/2016 FINDINGS: Brain: Mild diffuse cerebral atrophy. Ventricular dilatation is likely due to central atrophy. Normal pressure hydrocephalus would be a less likely consideration. Patchy white matter changes likely representing small vessel ischemia. No change since prior study. No mass-effect or midline shift. No abnormal extra-axial fluid collections. Gray-white matter junctions are distinct. Basal cisterns are not effaced. No acute intracranial hemorrhage. Vascular: Intracranial arterial  vascular calcifications are present. Skull: Calvarium appears intact. Sinuses/Orbits: Clear. Other: None. IMPRESSION: No acute intracranial abnormalities. Mild diffuse cerebral atrophy and small vessel ischemic changes. Ventricular dilatation is likely due to central atrophy. Electronically Signed   By: Lucienne Capers M.D.   On: 03/04/2018 23:19   Mr Brain Wo Contrast  Result Date: 03/05/2018 CLINICAL DATA:  57 y/o M; slurred speech, stuttering, right arm weakness since yesterday. EXAM: MRI HEAD WITHOUT CONTRAST TECHNIQUE: Multiplanar, multiecho pulse sequences of the brain and surrounding structures were obtained without intravenous contrast. COMPARISON:  03/04/2018 CT head.  04/27/2015 MRI head. FINDINGS: Brain: No acute infarction, hemorrhage, hydrocephalus, extra-axial collection or mass lesion. Stable ventricle size. Moderate diffuse volume loss of the brain. Absent septum pellucidum. Vascular: Normal flow voids. Skull and upper cervical spine: Normal marrow signal. Sinuses/Orbits: Negative. Other: Multiple dermal cyst, likely sebaceous cyst. IMPRESSION: 1. No acute intracranial abnormality. 2. Stable moderate diffuse volume loss of the brain and absent septum pellucidum. Electronically Signed   By: Kristine Garbe M.D.   On: 03/05/2018 05:00   Mr Jodene Nam Head Wo Contrast  Result Date: 03/05/2018 CLINICAL DATA:  Stroke follow-up EXAM: MRA HEAD WITHOUT CONTRAST TECHNIQUE: Angiographic images of the Circle of Willis were obtained using MRA technique without intravenous contrast. COMPARISON:  Brain MRI 03/05/2018 CTA head neck 04/26/2015 FINDINGS: ANTERIOR CIRCULATION: --Intracranial internal carotid arteries: Attenuated flow related enhancement of the distal cavernous segment of the left ICA is unchanged compared to 04/26/2015 allowing for differences in technique. Normal right ICA. --Anterior cerebral arteries: Normal. Both A1 segments are present. Patent anterior communicating artery. --Middle  cerebral arteries: Normal. --Posterior communicating arteries: Absent bilaterally. POSTERIOR CIRCULATION: --Basilar artery: Normal. --Posterior cerebral arteries: Normal. --Superior cerebellar arteries: Normal. --Inferior cerebellar arteries: Normal anterior and posterior inferior cerebellar arteries. IMPRESSION: No emergent large vessel occlusion. Narrowing of the distal cavernous segment of the left internal carotid artery is unchanged from the CTA 04/26/2015. Electronically Signed   By: Ulyses Jarred M.D.   On: 03/05/2018 23:02     Labs:   Basic Metabolic Panel: Recent Labs  Lab 03/04/18 2144 03/05/18 0731  NA 142 141  K 4.6 4.8  CL 109 108  CO2 25 25  GLUCOSE 161* 148*  BUN 29* 23*  CREATININE 1.53* 1.19  CALCIUM 9.2 9.5   GFR Estimated Creatinine Clearance: 94.3 mL/min (by C-G formula based on SCr of 1.19 mg/dL). Liver Function Tests: Recent Labs  Lab 03/05/18 0731  AST 18  ALT 17  ALKPHOS 58  BILITOT 0.7  PROT 7.1  ALBUMIN 3.8   No results for input(s): LIPASE, AMYLASE in the last 168 hours. No results for input(s): AMMONIA in the last 168 hours. Coagulation profile No results for input(s): INR, PROTIME in the last 168 hours.  CBC: Recent Labs  Lab 03/04/18 2144 03/05/18 0731  WBC 7.9 7.1  HGB 13.2 13.5  HCT 42.0 42.3  MCV 87.9 87.0  PLT 211 196   Cardiac Enzymes: Recent Labs  Lab 03/05/18 0744 03/05/18 1452 03/05/18 1913  TROPONINI 0.03* 0.03* 0.03*  BNP: Invalid input(s): POCBNP CBG: Recent Labs  Lab 03/05/18 1125 03/05/18 1632 03/05/18 2104 03/06/18 0609 03/06/18 1141  GLUCAP 154* 193* 143* 147* 182*   D-Dimer No results for input(s): DDIMER in the last 72 hours. Hgb A1c Recent Labs    03/05/18 0731  HGBA1C 7.0*   Lipid Profile Recent Labs    03/05/18 0731  CHOL 164  HDL 48  LDLCALC 98  TRIG 91  CHOLHDL 3.4   Thyroid function studies No results for input(s): TSH, T4TOTAL, T3FREE, THYROIDAB in the last 72  hours.  Invalid input(s): FREET3 Anemia work up No results for input(s): VITAMINB12, FOLATE, FERRITIN, TIBC, IRON, RETICCTPCT in the last 72 hours. Microbiology No results found for this or any previous visit (from the past 240 hour(s)).   Discharge Instructions:   Discharge Instructions    Diet - low sodium heart healthy   Complete by:  As directed    Diet Carb Modified   Complete by:  As directed    Discharge instructions   Complete by:  As directed    Home health Stop smoking   Increase activity slowly   Complete by:  As directed      Allergies as of 03/06/2018      Reactions   Amlodipine Swelling   Lower extremity swelling      Medication List    STOP taking these medications   clotrimazole 1 % cream Commonly known as:  LOTRIMIN   clotrimazole-betamethasone cream Commonly known as:  LOTRISONE   lisinopril 40 MG tablet Commonly known as:  PRINIVIL,ZESTRIL   predniSONE 10 MG tablet Commonly known as:  DELTASONE   sildenafil 50 MG tablet Commonly known as:  VIAGRA   triamcinolone cream 0.1 % Commonly known as:  KENALOG     TAKE these medications   ACCU-CHEK SOFTCLIX LANCET DEV Kit Use as directed   aspirin EC 81 MG tablet Take 1 tablet (81 mg total) by mouth daily.   atorvastatin 80 MG tablet Commonly known as:  LIPITOR Take 1 tablet (80 mg total) by mouth daily. Start taking on:  03/07/2018 What changed:    medication strength  how much to take  when to take this   cetirizine 10 MG tablet Commonly known as:  ZYRTEC Take 1 tablet (10 mg total) by mouth daily.   furosemide 40 MG tablet Commonly known as:  LASIX Take 1 tablet (40 mg total) by mouth every morning. What changed:  when to take this   gabapentin 300 MG capsule Commonly known as:  NEURONTIN TAKE 1 CAPSULE (300 MG TOTAL) BY MOUTH THREE TIMES DAILY.   glipiZIDE 10 MG 24 hr tablet Commonly known as:  GLUCOTROL XL Take 1 tablet (10 mg total) by mouth daily with breakfast.    glucose blood test strip Check blood sugars 1-2 times daily   labetalol 100 MG tablet Commonly known as:  NORMODYNE Take 0.5 tablets (50 mg total) by mouth 2 (two) times daily.   metFORMIN 1000 MG tablet Commonly known as:  GLUCOPHAGE Take 1 tablet (1,000 mg total) by mouth 2 (two) times daily with a meal.   TRUE METRIX METER w/Device Kit Use as directed   TRUEPLUS LANCETS 28G Misc Use as directed            Durable Medical Equipment  (From admission, onward)         Start     Ordered   03/06/18 1443  For home use only DME 3 n 1  Once     03/06/18 1442         Follow-up Information    Ladell Pier, MD Follow up in 1 week(s).   Specialty:  Internal Medicine Contact information: [place] [address] [place] [facility].   Why:  They will do your home health care at your home Contact information: [address] [place] [address] [phone]            Time coordinating discharge: 25 min  Signed:  [name]  [facility] 03/06/2018, 6:01 PM

## 2018-03-06 NOTE — Progress Notes (Signed)
NURSING PROGRESS NOTE  [name] [id number] Discharge Data: 03/06/2018 3:40 PM Attending Provider: [name], DO OZD:[id number], Dalbert Batman, MD     Ector Devonne Doughty to be D/C'd Home per MD order.  Discussed with the patient the After Visit Summary and all questions fully answered. All IV's discontinued with no bleeding noted. All belongings returned to patient for patient to take home.   Last Vital Signs:  Blood pressure (!) 144/61, pulse 63, temperature 97.9 F (36.6 C), temperature source Oral, resp. rate 11, height '6\' 3"'  (1.905 m), weight 122.5 kg, SpO2 95 %.  Discharge Medication List Allergies as of 03/06/2018      Reactions   Amlodipine Swelling   Lower extremity swelling      Medication List    STOP taking these medications   clotrimazole 1 % cream Commonly known as:  LOTRIMIN   clotrimazole-betamethasone cream Commonly known as:  LOTRISONE   lisinopril 40 MG tablet Commonly known as:  PRINIVIL,ZESTRIL   predniSONE 10 MG tablet Commonly known as:  DELTASONE   sildenafil 50 MG tablet Commonly known as:  VIAGRA   triamcinolone cream 0.1 % Commonly known as:  KENALOG     TAKE these medications   ACCU-CHEK SOFTCLIX LANCET DEV Kit Use as directed   aspirin EC 81 MG tablet Take 1 tablet (81 mg total) by mouth daily.   atorvastatin 80 MG tablet Commonly known as:  LIPITOR Take 1 tablet (80 mg total) by mouth daily. Start taking on:  03/07/2018 What changed:    medication strength  how much to take  when to take this   cetirizine 10 MG tablet Commonly known as:  ZYRTEC Take 1 tablet (10 mg total) by mouth daily.   furosemide 40 MG tablet Commonly known as:  LASIX Take 1 tablet (40 mg total) by mouth every morning. What changed:  when to take this   gabapentin 300 MG capsule Commonly known as:  NEURONTIN TAKE 1 CAPSULE (300 MG TOTAL) BY MOUTH THREE TIMES DAILY.   glipiZIDE 10 MG 24 hr tablet Commonly known as:  GLUCOTROL XL Take 1 tablet  (10 mg total) by mouth daily with breakfast.   glucose blood test strip Check blood sugars 1-2 times daily   labetalol 100 MG tablet Commonly known as:  NORMODYNE Take 0.5 tablets (50 mg total) by mouth 2 (two) times daily.   metFORMIN 1000 MG tablet Commonly known as:  GLUCOPHAGE Take 1 tablet (1,000 mg total) by mouth 2 (two) times daily with a meal.   TRUE METRIX METER w/Device Kit Use as directed   TRUEPLUS LANCETS 28G Misc Use as directed            Durable Medical Equipment  (From admission, onward)         Start     Ordered   03/06/18 1443  For home use only DME 3 n 1  Once     03/06/18 1442

## 2018-03-06 NOTE — Care Management Note (Signed)
Case Management Note  Patient Details  Name: [name] MRN: [id number] Date of Birth: 1960/12/31  Action/Plan: CM talked to patient about [facility] choices, pt chose [facility]; Butch Penny with [facility] called for arrangements; also 3:1 ordered and to be delivered to the home today prior to discharging home; patient has a personal care services aide with [facility] that assist in his care 7 days a week for 2 -2.5 her; DME - cane and walker at home.  Expected Discharge Date:  03/06/18               Expected Discharge Plan:  [place]  Discharge planning Services  CM Consult  Choice offered to:  Patient  DME Arranged:  3-N-1 DME Agency:  [place]:  PT, OT [facility] Agency:  [place]  Status of Service:  In process, will continue to follow  Sherrilyn Rist [phone] 03/06/2018, 2:42 PM

## 2018-03-06 NOTE — Progress Notes (Addendum)
STROKE TEAM PROGRESS NOTE   INTERVAL HISTORY Patient up in chair at bedside. No new complains. Feels R sided weakness has resolved. Stroke workup unrevealing. No stroke, no TIA. He is not anxious about planned d/c today. Discussed case w/ attending.  Vitals:   03/05/18 1914 03/05/18 2353 03/06/18 0320 03/06/18 0804  BP: (!) 142/61 (!) 119/50 (!) 121/59 (!) 139/56  Pulse:    (!) 57  Resp:    11  Temp: 98.2 F (36.8 C) 97.7 F (36.5 C) 98 F (36.7 C) 98.3 F (36.8 C)  TempSrc: Oral Oral Oral Oral  SpO2:  97%  97%  Weight:      Height:        CBC:  Recent Labs  Lab 03/04/18 2144 03/05/18 0731  WBC 7.9 7.1  HGB 13.2 13.5  HCT 42.0 42.3  MCV 87.9 87.0  PLT 211 409    Basic Metabolic Panel:  Recent Labs  Lab 03/04/18 2144 03/05/18 0731  NA 142 141  K 4.6 4.8  CL 109 108  CO2 25 25  GLUCOSE 161* 148*  BUN 29* 23*  CREATININE 1.53* 1.19  CALCIUM 9.2 9.5   Lipid Panel:     Component Value Date/Time   CHOL 164 03/05/2018 0731   TRIG 91 03/05/2018 0731   HDL 48 03/05/2018 0731   CHOLHDL 3.4 03/05/2018 0731   VLDL 18 03/05/2018 0731   LDLCALC 98 03/05/2018 0731   HgbA1c:  Lab Results  Component Value Date   HGBA1C 7.0 (H) 03/05/2018   Urine Drug Screen:     Component Value Date/Time   LABOPIA NONE DETECTED 03/05/2018 1846   COCAINSCRNUR NONE DETECTED 03/05/2018 1846   LABBENZ NONE DETECTED 03/05/2018 1846   AMPHETMU NONE DETECTED 03/05/2018 1846   THCU NONE DETECTED 03/05/2018 1846   LABBARB NONE DETECTED 03/05/2018 1846    Alcohol Level     Component Value Date/Time   ETH <5 04/26/2015 1151    IMAGING Ct Head Wo Contrast  Result Date: 03/04/2018 CLINICAL DATA:  Right arm and leg feel weird. Symptoms beginning at 1930 hours today. Previous back injury affecting the left side. EXAM: CT HEAD WITHOUT CONTRAST TECHNIQUE: Contiguous axial images were obtained from the base of the skull through the vertex without intravenous contrast. COMPARISON:   01/23/2016 FINDINGS: Brain: Mild diffuse cerebral atrophy. Ventricular dilatation is likely due to central atrophy. Normal pressure hydrocephalus would be a less likely consideration. Patchy white matter changes likely representing small vessel ischemia. No change since prior study. No mass-effect or midline shift. No abnormal extra-axial fluid collections. Gray-white matter junctions are distinct. Basal cisterns are not effaced. No acute intracranial hemorrhage. Vascular: Intracranial arterial vascular calcifications are present. Skull: Calvarium appears intact. Sinuses/Orbits: Clear. Other: None. IMPRESSION: No acute intracranial abnormalities. Mild diffuse cerebral atrophy and small vessel ischemic changes. Ventricular dilatation is likely due to central atrophy. Electronically Signed   By: Lucienne Capers M.D.   On: 03/04/2018 23:19   Mr Brain Wo Contrast  Result Date: 03/05/2018 CLINICAL DATA:  57 y/o M; slurred speech, stuttering, right arm weakness since yesterday. EXAM: MRI HEAD WITHOUT CONTRAST TECHNIQUE: Multiplanar, multiecho pulse sequences of the brain and surrounding structures were obtained without intravenous contrast. COMPARISON:  03/04/2018 CT head.  04/27/2015 MRI head. FINDINGS: Brain: No acute infarction, hemorrhage, hydrocephalus, extra-axial collection or mass lesion. Stable ventricle size. Moderate diffuse volume loss of the brain. Absent septum pellucidum. Vascular: Normal flow voids. Skull and upper cervical spine: Normal marrow signal. Sinuses/Orbits: Negative.  Other: Multiple dermal cyst, likely sebaceous cyst. IMPRESSION: 1. No acute intracranial abnormality. 2. Stable moderate diffuse volume loss of the brain and absent septum pellucidum. Electronically Signed   By: Kristine Garbe M.D.   On: 03/05/2018 05:00   Mr Jodene Nam Head Wo Contrast  Result Date: 03/05/2018 CLINICAL DATA:  Stroke follow-up EXAM: MRA HEAD WITHOUT CONTRAST TECHNIQUE: Angiographic images of the Circle  of Willis were obtained using MRA technique without intravenous contrast. COMPARISON:  Brain MRI 03/05/2018 CTA head neck 04/26/2015 FINDINGS: ANTERIOR CIRCULATION: --Intracranial internal carotid arteries: Attenuated flow related enhancement of the distal cavernous segment of the left ICA is unchanged compared to 04/26/2015 allowing for differences in technique. Normal right ICA. --Anterior cerebral arteries: Normal. Both A1 segments are present. Patent anterior communicating artery. --Middle cerebral arteries: Normal. --Posterior communicating arteries: Absent bilaterally. POSTERIOR CIRCULATION: --Basilar artery: Normal. --Posterior cerebral arteries: Normal. --Superior cerebellar arteries: Normal. --Inferior cerebellar arteries: Normal anterior and posterior inferior cerebellar arteries. IMPRESSION: No emergent large vessel occlusion. Narrowing of the distal cavernous segment of the left internal carotid artery is unchanged from the CTA 04/26/2015. Electronically Signed   By: Ulyses Jarred M.D.   On: 03/05/2018 23:02   Carotid Doppler   There is 1-39% bilateral ICA stenosis. Vertebral artery flow is antegrade.   2D Echocardiogram  - Left ventricle: The cavity size was mildly dilated. There was mild concentric hypertrophy. Systolic function was vigorous. The estimated ejection fraction was in the range of 65% to 70%. Wall motion was normal; there were no regional wall motion abnormalities. Features are consistent with a pseudonormal left ventricular filling pattern, with concomitant abnormal relaxation and increased filling pressure (grade 2 diastolic dysfunction). - Left atrium: The atrium was mildly dilated. - Pulmonary arteries: Systolic pressure could not be accurately estimated.   PHYSICAL EXAM General: Appears well-developed and well-nourished. Obese. Psych: Affect appropriate to situation Eyes: No scleral injection HENT: No OP obstrucion Head: Normocephalic.  Cardiovascular: Normal rate and  regular rhythm.  Respiratory: Effort normal and breath sounds normal to anterior ascultation Skin: WDI. BUE light papular rash, also on his back. LE darkening lower 1/4 of leg. LLE edema  Neurological Examination Mental Status: Alert, oriented, thought content appropriate.  Speech fluent without evidence of aphasia. Able to follow 3 step commands without difficulty. Cranial Nerves: II: Visual fields grossly normal,  III,IV, VI: ptosis not present, extra-ocular motions intact bilaterally, pupils equal, round, reactive to light and accommodation V,VII: smile symmetric, facial light touch sensation normal bilaterally VIII: hearing normal bilaterally IX,X: uvula rises symmetrically XI: bilateral shoulder shrug XII: midline tongue extension Motor: Right :  Upper extremity   5/5                                      Left:     Upper extremity   4/5             Lower extremity   4+/5                                                  Lower extremity   4+/5 Tone and bulk: Left upper arm increased tone and spasticity with  Mild contracture of his L hand Sensory: Pinprick and light touch intact throughout, bilaterally Plantars: Right: downgoing  Left: upgoing Cerebellar: normal finger-to-nose no gross ataxia Gait:did not assess    ASSESSMENT/PLAN Mr. ZEB RAWL is a 57 y.o. 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   TIA vs. Conversion disorder   CT head no acute abnormalities. Small vessel disease. Atrophy. Ventricular dilation  MRI  No acute stroke. Atrophy. Absent septum pellucidum  MRA  No ELVO. Narrowing L ICA unchanged from 2016  Carotid Doppler  B ICA 1-39% stenosis, VAs antegrade   2D Echo  EF 65-70%, no SOE  LDL 98  HgbA1c 7.0  Lovenox 40 mg sq  daily for VTE prophylaxis  Reported not taking aspirin 81 mg daily routinely prior to admission, now on aspirin 325 mg daily. Ok to resume home dose of 81 mg and continue at d/c. Orders adjusted.  Therapy recommendations:  HH PT, OT  Disposition:  Return home  Hypertension  Stable . BP goal normotensive  Hyperlipidemia  Home meds:  lipitor 40 - states he was taking regularly  Increased to Lipitor 80 mg in hospital  LDL 98, goal < 70  Continue statin at discharge  Diabetes type II w/ PN  HgbA1c 7.0, goal < 7.0  Other Stroke Risk Factors  Cigarette smoker, advised to stop smoking  ETOH use, advised to drink no more than 2 drink(s) a day  Crack cocaine use, advised to avoid d/t stroke risk  Obesity, Body mass index is 33.75 kg/m., recommend weight loss, diet and exercise as appropriate   NO Hx stroke/TIA  04/2015 Pre-existing L weakness/spasticity, not related to new stroke or spinal disease, etiology unclear. MRI neg  12/2013 generalized weakness. MRI neg. Intoxicated. Fall. Cord edema/contusion   05/12/2012 L HP and stuttering speech. MRI neg. conversion d/o  02-22-2011 ?TIA  L side numbness/weakness  12-20-2010  MRI neg  Family hx stroke (father)  Chronic diastolic Congestive heart failure  Other Active Problems  HOH  abnormal EKG changes  BLE edema  NOTHING FURTHER TO ADD FROM THE STROKE STANDPOINT  Ongoing risk factor control by Primary Care Physician  Stroke Service will sign off. Please call should any needs arise.  No need for neuro follow-up.    Hospital day # 0  Burnetta Sabin, MSN, APRN, ANVP-BC, AGPCNP-BC Advanced Practice Stroke Nurse [place] for Schedule & Pager information 03/06/2018 9:57 AM   ATTENDING NOTE: I reviewed above note and agree with the assessment and plan. I have made any additions or clarifications directly to the above note. Pt was seen and examined.   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  He had extensive TIAs/strokes history all MRI negative in the past (see below), and he was once diagnosed with conversion disorder too.  02/2011 TIA with left sided numbness/weakness, MRI negative.  04/2012 left-sided weakness and stuttering, MRI negative.  Considered conversion disorder  12/2013, generalized weakness after alcohol intoxication and fall.  Found to have cord edema at C5-6 due to cord contusion post a fall.  MRI brain negative.  04/2015 left arm and leg weakness.  MRI brain negative.  MRI C-spine stable.  05/2015 - patient follow-up with Dr. Krista Blue for gait difficulty.  On this admission, MRI  brain again negative.  Carotid Doppler negative.  EF 65 to 70%.  A1c 7.0 LDL 98.  MRA unremarkable, left distal cavernous ICA stenosis unchanged from 04/2015. UDS negative.  Patient current condition concern for TIA versus conversion disorder given MRI always negative and previous diagnosis of conversion disorder.  pt so far denies any stressor at home. Continue aspirin 325 and Lipitor 80 for stroke prevention.  Stroke risk factor modification.  PT/OT recommend home PT/OT. He has been follow up with Dr. Krista Blue at [facility]. Will continue follow up with Dr. Krista Blue.   Neurology will sign off. Please call with questions. Pt will follow up with Dr. Krista Blue at [facility] in about 4-6 weeks. Thanks for the consult.   Rosalin Hawking, MD PhD Stroke Neurology 03/06/2018 6:52 PM   To contact Stroke Continuity provider, please refer to [email or web address] After hours, contact General Neurology

## 2018-03-06 NOTE — Progress Notes (Signed)
Physical Therapy Treatment Patient Details Name: [name] MRN: [id number] DOB: [birth year] Today's Date: 03/06/2018    History of Present Illness 57 y.o. male with history of diabetes mellitus type 2, diastolic dysfunction, hypertension, alcohol and cocaine abuse previously with history of left-sided weakness from spinal cord injury started experiencing right-sided weakness. Neuro work up underway    PT Comments    Patient requires supervision/min guard assist for OOB mobility with AD. Pt will continue to benefit from further skilled PT services to maximize independence and safety with mobility.    Follow Up Recommendations  Home health PT;Supervision - Intermittent     Equipment Recommendations  None recommended by PT    Recommendations for Other Services       Precautions / Restrictions Precautions Precautions: Fall    Mobility  Bed Mobility               General bed mobility comments: pt OOB in chair upon arrival   Transfers Overall transfer level: Needs assistance Equipment used: Quad cane Transfers: Sit to/from Stand Sit to Stand: Supervision;Min guard         General transfer comment: for safety; pt required increased time to achieve standing but no physical assist required  Ambulation/Gait Ambulation/Gait assistance: Min guard Gait Distance (Feet): 200 Feet Assistive device: Quad cane Gait Pattern/deviations: Decreased stride length;Drifts right/left;Decreased step length - left;Step-through pattern;Decreased step length - right;Wide base of support Gait velocity: decreased   General Gait Details: cues for increased stride length; pt unsteady but no overt LOB    Stairs             Wheelchair Mobility    Modified Rankin (Stroke Patients Only) Modified Rankin (Stroke Patients Only) Pre-Morbid Rankin Score: Moderate disability Modified Rankin: Moderate disability     Balance Overall balance assessment: Needs  assistance Sitting-balance support: Feet supported Sitting balance-Leahy Scale: Good     Standing balance support: Single extremity supported;During functional activity Standing balance-Leahy Scale: Fair                              Cognition Arousal/Alertness: Awake/alert Behavior During Therapy: WFL for tasks assessed/performed Overall Cognitive Status: Impaired/Different from baseline                                 General Comments: pt with delayed processing       Exercises      General Comments        Pertinent Vitals/Pain Pain Assessment: No/denies pain    Home Living                      Prior Function            PT Goals (current goals can now be found in the care plan section) Acute Rehab PT Goals Patient Stated Goal: to go home PT Goal Formulation: With patient Time For Goal Achievement: 03/19/18 Potential to Achieve Goals: Good Progress towards PT goals: Progressing toward goals    Frequency    Min 3X/week      PT Plan Current plan remains appropriate    Co-evaluation              AM-PAC PT "6 Clicks" Daily Activity  Outcome Measure  Difficulty turning over in bed (including adjusting bedclothes, sheets and blankets)?: None Difficulty moving from lying on back to sitting  on the side of the bed? : A Little Difficulty sitting down on and standing up from a chair with arms (e.g., wheelchair, bedside commode, etc,.)?: A Little Help needed moving to and from a bed to chair (including a wheelchair)?: A Little Help needed walking in hospital room?: A Little Help needed climbing 3-5 steps with a railing? : A Lot 6 Click Score: 18    End of Session Equipment Utilized During Treatment: Gait belt Activity Tolerance: Patient tolerated treatment well Patient left: in chair;with call bell/phone within reach Nurse Communication: Mobility status PT Visit Diagnosis: Unsteadiness on feet (R26.81);Difficulty in  walking, not elsewhere classified (R26.2)     Time: 1310-1330 PT Time Calculation (min) (ACUTE ONLY): 20 min  Charges:  $Gait Training: 8-22 mins                     [name], PTA Pager: [phone]     [name] 03/06/2018, 1:42 PM

## 2018-03-08 ENCOUNTER — Telehealth: Payer: Self-pay | Admitting: Internal Medicine

## 2018-03-08 DIAGNOSIS — I130 Hypertensive heart and chronic kidney disease with heart failure and stage 1 through stage 4 chronic kidney disease, or unspecified chronic kidney disease: Secondary | ICD-10-CM | POA: Diagnosis not present

## 2018-03-08 DIAGNOSIS — E669 Obesity, unspecified: Secondary | ICD-10-CM | POA: Diagnosis not present

## 2018-03-08 DIAGNOSIS — I5032 Chronic diastolic (congestive) heart failure: Secondary | ICD-10-CM | POA: Diagnosis not present

## 2018-03-08 DIAGNOSIS — E1142 Type 2 diabetes mellitus with diabetic polyneuropathy: Secondary | ICD-10-CM | POA: Diagnosis not present

## 2018-03-08 DIAGNOSIS — S14105S Unspecified injury at C5 level of cervical spinal cord, sequela: Secondary | ICD-10-CM | POA: Diagnosis not present

## 2018-03-08 DIAGNOSIS — E785 Hyperlipidemia, unspecified: Secondary | ICD-10-CM | POA: Diagnosis not present

## 2018-03-08 DIAGNOSIS — E1122 Type 2 diabetes mellitus with diabetic chronic kidney disease: Secondary | ICD-10-CM | POA: Diagnosis not present

## 2018-03-08 DIAGNOSIS — N182 Chronic kidney disease, stage 2 (mild): Secondary | ICD-10-CM | POA: Diagnosis not present

## 2018-03-08 DIAGNOSIS — Z7984 Long term (current) use of oral hypoglycemic drugs: Secondary | ICD-10-CM | POA: Diagnosis not present

## 2018-03-08 DIAGNOSIS — Z8673 Personal history of transient ischemic attack (TIA), and cerebral infarction without residual deficits: Secondary | ICD-10-CM | POA: Diagnosis not present

## 2018-03-08 DIAGNOSIS — G8194 Hemiplegia, unspecified affecting left nondominant side: Secondary | ICD-10-CM | POA: Diagnosis not present

## 2018-03-08 DIAGNOSIS — F17210 Nicotine dependence, cigarettes, uncomplicated: Secondary | ICD-10-CM | POA: Diagnosis not present

## 2018-03-08 DIAGNOSIS — Z7982 Long term (current) use of aspirin: Secondary | ICD-10-CM | POA: Diagnosis not present

## 2018-03-08 DIAGNOSIS — F1011 Alcohol abuse, in remission: Secondary | ICD-10-CM | POA: Diagnosis not present

## 2018-03-08 NOTE — Telephone Encounter (Signed)
PT called for verbal orders post hospital stay 1 week 1 2 week 4. Working on balance  gate  Please leave vm it is a secure line

## 2018-03-11 DIAGNOSIS — E1122 Type 2 diabetes mellitus with diabetic chronic kidney disease: Secondary | ICD-10-CM | POA: Diagnosis not present

## 2018-03-11 DIAGNOSIS — Z8673 Personal history of transient ischemic attack (TIA), and cerebral infarction without residual deficits: Secondary | ICD-10-CM | POA: Diagnosis not present

## 2018-03-11 DIAGNOSIS — G8194 Hemiplegia, unspecified affecting left nondominant side: Secondary | ICD-10-CM | POA: Diagnosis not present

## 2018-03-11 DIAGNOSIS — S14105S Unspecified injury at C5 level of cervical spinal cord, sequela: Secondary | ICD-10-CM | POA: Diagnosis not present

## 2018-03-11 DIAGNOSIS — I5032 Chronic diastolic (congestive) heart failure: Secondary | ICD-10-CM | POA: Diagnosis not present

## 2018-03-11 DIAGNOSIS — I130 Hypertensive heart and chronic kidney disease with heart failure and stage 1 through stage 4 chronic kidney disease, or unspecified chronic kidney disease: Secondary | ICD-10-CM | POA: Diagnosis not present

## 2018-03-11 NOTE — Telephone Encounter (Signed)
Pt 1 time 1 week 2 weeks 4 times a week

## 2018-03-12 ENCOUNTER — Encounter: Payer: Self-pay | Admitting: Podiatry

## 2018-03-12 ENCOUNTER — Ambulatory Visit (INDEPENDENT_AMBULATORY_CARE_PROVIDER_SITE_OTHER): Payer: Medicare Other | Admitting: Podiatry

## 2018-03-12 DIAGNOSIS — E1142 Type 2 diabetes mellitus with diabetic polyneuropathy: Secondary | ICD-10-CM | POA: Diagnosis not present

## 2018-03-12 DIAGNOSIS — Q828 Other specified congenital malformations of skin: Secondary | ICD-10-CM | POA: Diagnosis not present

## 2018-03-12 DIAGNOSIS — M79675 Pain in left toe(s): Secondary | ICD-10-CM | POA: Diagnosis not present

## 2018-03-12 DIAGNOSIS — B351 Tinea unguium: Secondary | ICD-10-CM

## 2018-03-12 DIAGNOSIS — M79674 Pain in right toe(s): Secondary | ICD-10-CM | POA: Diagnosis not present

## 2018-03-12 NOTE — Telephone Encounter (Signed)
Returned [place] call from [facility] and left a detailed vm informing her of the verbal orders to do physical therapy and if she has any questions or concerns to give me a call

## 2018-03-13 DIAGNOSIS — Z79899 Other long term (current) drug therapy: Secondary | ICD-10-CM | POA: Diagnosis not present

## 2018-03-13 NOTE — Progress Notes (Signed)
Subjective: 57 y.o. returns the office today for painful, elongated, thickened toenails which he cannot trim himself. Denies any redness or drainage around the nails.  He also is a callus to the bottom of his left big toe.  Denies any open sores denies any redness or drainage or any swelling.  He has no other concerns. Denies any acute changes since last appointment and no new complaints today. Denies any systemic complaints such as fevers, chills, nausea, vomiting.   PCP: Ladell Pier, MD  Objective: AAO 3, NAD DP/PT pulses decreased, chronic bilateral lower extremity edema which appears to be chronic. Protective sensation decreased with Derrel Nip monofilament Nails hypertrophic, dystrophic, elongated, brittle, discolored 10. There is tenderness overlying the nails 1-5 bilaterally. There is no surrounding erythema or drainage along the nail sites. Hyperkeratotic lesion left plantar hallux.  Upon debridement there is no underlying ulceration, drainage or any form of infection present. No other areas of tenderness bilateral lower extremities. No overlying edema, erythema, increased warmth. No pain with calf compression, swelling, warmth, erythema.  Assessment: Patient presents with symptomatic onychomycosis; hyperkeratotic lesion  Plan: -Treatment options including alternatives, risks, complications were discussed -Nails sharply debrided 10 without complication/bleeding. -Hyperkeratotic lesion was sharply debrided x1 without any complications or bleeding.  Recommend moisturizer to the area daily. -Discussed daily foot inspection. If there are any changes, to call the office immediately.  -Follow-up in 3 months or sooner if any problems are to arise. In the meantime, encouraged to call the office with any questions, concerns, changes symptoms.  Celesta Gentile, DPM

## 2018-03-15 DIAGNOSIS — Z79899 Other long term (current) drug therapy: Secondary | ICD-10-CM | POA: Diagnosis not present

## 2018-03-19 ENCOUNTER — Telehealth: Payer: Self-pay

## 2018-03-19 ENCOUNTER — Encounter: Payer: Self-pay | Admitting: Internal Medicine

## 2018-03-19 ENCOUNTER — Ambulatory Visit: Payer: Medicare Other | Attending: Internal Medicine | Admitting: Internal Medicine

## 2018-03-19 VITALS — BP 130/62 | HR 64 | Temp 97.9°F | Resp 16 | Wt 288.4 lb

## 2018-03-19 DIAGNOSIS — I252 Old myocardial infarction: Secondary | ICD-10-CM | POA: Diagnosis not present

## 2018-03-19 DIAGNOSIS — S14105S Unspecified injury at C5 level of cervical spinal cord, sequela: Secondary | ICD-10-CM | POA: Diagnosis not present

## 2018-03-19 DIAGNOSIS — Z79899 Other long term (current) drug therapy: Secondary | ICD-10-CM | POA: Diagnosis not present

## 2018-03-19 DIAGNOSIS — Z7982 Long term (current) use of aspirin: Secondary | ICD-10-CM | POA: Insufficient documentation

## 2018-03-19 DIAGNOSIS — I5032 Chronic diastolic (congestive) heart failure: Secondary | ICD-10-CM | POA: Diagnosis not present

## 2018-03-19 DIAGNOSIS — Z7984 Long term (current) use of oral hypoglycemic drugs: Secondary | ICD-10-CM | POA: Diagnosis not present

## 2018-03-19 DIAGNOSIS — G459 Transient cerebral ischemic attack, unspecified: Secondary | ICD-10-CM | POA: Diagnosis not present

## 2018-03-19 DIAGNOSIS — I130 Hypertensive heart and chronic kidney disease with heart failure and stage 1 through stage 4 chronic kidney disease, or unspecified chronic kidney disease: Secondary | ICD-10-CM | POA: Diagnosis not present

## 2018-03-19 DIAGNOSIS — M48061 Spinal stenosis, lumbar region without neurogenic claudication: Secondary | ICD-10-CM | POA: Diagnosis not present

## 2018-03-19 DIAGNOSIS — E118 Type 2 diabetes mellitus with unspecified complications: Secondary | ICD-10-CM | POA: Diagnosis not present

## 2018-03-19 DIAGNOSIS — E669 Obesity, unspecified: Secondary | ICD-10-CM | POA: Insufficient documentation

## 2018-03-19 DIAGNOSIS — Z8673 Personal history of transient ischemic attack (TIA), and cerebral infarction without residual deficits: Secondary | ICD-10-CM | POA: Diagnosis not present

## 2018-03-19 DIAGNOSIS — F1410 Cocaine abuse, uncomplicated: Secondary | ICD-10-CM | POA: Insufficient documentation

## 2018-03-19 DIAGNOSIS — I509 Heart failure, unspecified: Secondary | ICD-10-CM | POA: Insufficient documentation

## 2018-03-19 DIAGNOSIS — E1122 Type 2 diabetes mellitus with diabetic chronic kidney disease: Secondary | ICD-10-CM | POA: Diagnosis not present

## 2018-03-19 DIAGNOSIS — E1142 Type 2 diabetes mellitus with diabetic polyneuropathy: Secondary | ICD-10-CM | POA: Diagnosis not present

## 2018-03-19 DIAGNOSIS — I10 Essential (primary) hypertension: Secondary | ICD-10-CM | POA: Diagnosis not present

## 2018-03-19 DIAGNOSIS — Z87891 Personal history of nicotine dependence: Secondary | ICD-10-CM | POA: Diagnosis not present

## 2018-03-19 DIAGNOSIS — M7989 Other specified soft tissue disorders: Secondary | ICD-10-CM | POA: Insufficient documentation

## 2018-03-19 DIAGNOSIS — G8194 Hemiplegia, unspecified affecting left nondominant side: Secondary | ICD-10-CM | POA: Diagnosis not present

## 2018-03-19 DIAGNOSIS — I2510 Atherosclerotic heart disease of native coronary artery without angina pectoris: Secondary | ICD-10-CM | POA: Diagnosis not present

## 2018-03-19 LAB — GLUCOSE, POCT (MANUAL RESULT ENTRY): POC GLUCOSE: 81 mg/dL (ref 70–99)

## 2018-03-19 MED ORDER — ASPIRIN EC 81 MG PO TBEC: 81.0000 mg | DELAYED_RELEASE_TABLET | Freq: Every day | ORAL | 1 refills | Status: DC

## 2018-03-19 MED FILL — TRUEplus LANCETS 28G MISC: 25 days supply | Qty: 100 | Fill #0

## 2018-03-19 MED FILL — TRUE METRIX TEST STRIP: 50 days supply | Qty: 100 | Fill #0

## 2018-03-19 MED FILL — !TRUE METRIX BLOOD GLUCOSE: 1 days supply | Qty: 1 | Fill #0

## 2018-03-19 NOTE — Progress Notes (Signed)
Patient ID: [name], male    DOB: [birth year]  MRN: [id number]  CC: No chief complaint on file.   Subjective: [name] is a 57 y.o. male who presents for hosp f/u His concerns today include:  57 year old male with history ofDM2, HTN, dCHF,tob dep, HL, CVA (about 3 yrs ago per pt), spinal stenosis of lumbar spine,chronic lower extremity edema  Patient presents for hospital follow-up visit.  Hospitalized 8/12-14/2019 with TIA versus conversion disorder.  Patient had presented with complaint of difficulty moving his right upper extremity.  MRI of head was negative for acute ischemic event.  Carotid Doppler revealed no high-grade stenosis.  2D echo revealed EF 65 to 70% no source of emboli, LDL cholesterol 98, A1c 7.0.  Reported not taking aspirin routinely.  Patient started on aspirin, inc dose of Lipitor and was discharged home with PT and OT.  Told to resume home medications.  He has not purchased ASA as yet.  States he was told to schedule an appt with neurology [facility] in [place] [facility] P.T just started today Quit smoking completely 2 wks ago.  Does not ETOH drink any more. Took Labetalol already for today  DM:  Never got diabetic supplies.  Told has to pay $12 and he was upset about it.  However he is prepared to pick up the prescription today.  Patient Active Problem List   Diagnosis Date Noted  . TIA (transient ischemic attack) 03/05/2018  . Benign essential HTN 03/05/2018  . Sensorineural hearing loss of both ears 12/04/2017  . Mild nonproliferative diabetic retinopathy ([place]) 01/10/2016  . Hypertensive retinopathy of both eyes 01/10/2016  . Spinal stenosis of lumbar region 07/15/2015  . Abnormality of gait 06/07/2015  . Hydrocephalus 06/07/2015  . Peripheral neuropathy 04/26/2015  . Constipation 04/26/2015  . Diabetes mellitus type 2 with complications ([place]) 28/78/6767  . Decreased hearing of both ears 03/15/2015  . Tinea pedis 03/15/2015  . Callus of  foot 05/22/2014  . 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 Coronary vasospasm, cocaine related 12/29/2013  . Cocaine abuse ([place]) 12/28/2013  . Alcoholism /alcohol abuse ([place]) 12/28/2013  . Left-sided weakness 12/26/2013  . Hyperlipidemia associated with type 2 diabetes mellitus ([place]) 04/01/2007  . OBESITY NOS 03/28/2007  . Essential hypertension 03/28/2007     Current Outpatient Medications on File Prior to Visit  Medication Sig Dispense Refill  . aspirin EC 81 MG tablet Take 1 tablet (81 mg total) by mouth daily. (Patient not taking: Reported on 03/05/2018) 100 tablet 0  . atorvastatin (LIPITOR) 80 MG tablet Take 1 tablet (80 mg total) by mouth daily. 30 tablet 0  . Blood Glucose Monitoring Suppl (TRUE METRIX METER) w/Device KIT Use as directed 1 kit 0  . cetirizine (ZYRTEC) 10 MG tablet Take 1 tablet (10 mg total) by mouth daily. 30 tablet 11  . furosemide (LASIX) 40 MG tablet Take 1 tablet (40 mg total) by mouth every morning. (Patient taking differently: Take 40 mg by mouth daily. ) 30 tablet 6  . gabapentin (NEURONTIN) 300 MG capsule TAKE 1 CAPSULE (300 MG TOTAL) BY MOUTH THREE TIMES DAILY. 270 capsule 3  . glipiZIDE (GLIPIZIDE XL) 10 MG 24 hr tablet Take 1 tablet (10 mg total) by mouth daily with breakfast. 30 tablet 2  . glucose blood (TRUE METRIX BLOOD GLUCOSE TEST) test strip Check blood sugars 1-2 times daily 100 each 12  . labetalol (NORMODYNE) 100 MG tablet Take 0.5 tablets (50 mg  total) by mouth 2 (two) times daily. (Patient taking differently: Take 50 mg by mouth 2 (two) times daily. ) 30 tablet 5  . Lancets Misc. (ACCU-CHEK SOFTCLIX LANCET DEV) KIT Use as directed 1 kit 0  . metFORMIN (GLUCOPHAGE) 1000 MG tablet Take 1 tablet (1,000 mg total) by mouth 2 (two) times daily with a meal. 60 tablet 2  . TRUEPLUS LANCETS 28G MISC Use as directed 100 each 6   No current facility-administered medications on file prior to visit.     Allergies  Allergen Reactions  .  Amlodipine Swelling    Lower extremity swelling    Social History   Socioeconomic History  . Marital status: Divorced    Spouse name: Not on file  . Number of children: 0  . Years of education: 10  . Highest education level: Not on file  Occupational History  . Occupation: Unemployed  Social Needs  . Financial resource strain: Not on file  . Food insecurity:    Worry: Not on file    Inability: Not on file  . Transportation needs:    Medical: Not on file    Non-medical: Not on file  Tobacco Use  . Smoking status: Former Smoker    Years: 45.00    Types: Cigarettes    Last attempt to quit: 03/03/2018    Years since quitting: 0.0  . Smokeless tobacco: Never Used  . Tobacco comment: 11-15-2017  per pt 1pp3days  Substance and Sexual Activity  . Alcohol use: Not Currently    Alcohol/week: 0.0 standard drinks    Comment: 11-15-2017  per pt last alcohol 12/ 2018  alcohol abuse  . Drug use: Yes    Types: "Crack" cocaine, Cocaine    Comment: 11-15-2017  per pt last used March 2019  . Sexual activity: Not on file  Lifestyle  . Physical activity:    Days per week: Not on file    Minutes per session: Not on file  . Stress: Not on file  Relationships  . Social connections:    Talks on phone: Not on file    Gets together: Not on file    Attends religious service: Not on file    Active member of club or organization: Not on file    Attends meetings of clubs or organizations: Not on file    Relationship status: Not on file  . Intimate partner violence:    Fear of current or ex partner: Not on file    Emotionally abused: Not on file    Physically abused: Not on file    Forced sexual activity: Not on file  Other Topics Concern  . Not on file  Social History Narrative   Lives at home alone.   Right-handed.   Occasional use.    Family History  Problem Relation Age of Onset  . Hypertension Mother   . Diabetes Mother   . Cancer Mother        breast cancer   . Hypertension  Father   . Heart disease Father   . Stroke Father   . Hypertension Sister     Past Surgical History:  Procedure Laterality Date  . CARDIAC CATHETERIZATION  12/01/2008   dr hochrein   abnormal stress myoview:  mild coronary plaque, normal LVF  . CARDIAC CATHETERIZATION  04/29/2011   dr [place]   in setting ECG with new ST elevation & severe hypertensive:  nonobstructive atherosclerotic CAD, normal LVF (30% mRCA)   . CARDIOVASCULAR STRESS TEST  08/15/2010   Low nuclear study w/ no evidence ishemia/  ef 42% with lateral and apical hypokinesis  . CYSTOSCOPY WITH BIOPSY N/A 11/20/2017   Procedure: CYSTOSCOPY WITH BIOPSY AND FULGURATION;  Surgeon: [name], MD;  Location: [facility];  Service: Urology;  Laterality: N/A;  . INCISION AND DRAINAGE RIGHT DISTAL MEDIAL THIGH HEMATOMA  04-14-2005   dr Ninfa Linden  . left arm skin graft  1976   injury  . LEFT HEART CATHETERIZATION WITH CORONARY ANGIOGRAM Bilateral 12/26/2013   Procedure: LEFT HEART CATHETERIZATION WITH CORONARY ANGIOGRAM;  Surgeon: Burnell Blanks, MD;  Location: [facility] CATH LAB;  Service: Cardiovascular;  Laterality: Bilateral;  STEMI, in setting cocaine/ alcohol :  mid disease in the RCA (20%), moderate disease in the small caliber intermediate branch (distal 50-60%), normal LVSF (ef 55-60%)  . TRANSTHORACIC ECHOCARDIOGRAM  08/05/2017   moderate LVH,  ef 55%,  grade 1 diastolic dysfunction/  mild LAE/  trivial Tr    ROS: Review of Systems Negative except as above. PHYSICAL EXAM: BP (!) 164/76   Pulse 64   Temp 97.9 F (36.6 C) (Oral)   Resp 16   Wt 288 lb 6.4 oz (130.8 kg)   SpO2 95%   BMI 36.05 kg/m   BP 130/62 Physical Exam  General appearance - alert, well appearing, and in no distress Mental status - normal mood, behavior, speech, dress, motor activity, and thought processes Chest - clear to auscultation, no wheezes, rales or rhonchi, symmetric air entry Heart - normal rate, regular rhythm, normal  S1, S2, no murmurs, rubs, clicks or gallops Extremities - no LE edema, LLE slightly larger than RT Neuro:  Grip 4/5 BL.  Power 5/5 BL UE prox and distal Diabetic Foot Exam - Simple   Simple Foot Form Visual Inspection See comments:  Yes Sensation Testing Pulse Check Posterior Tibialis and Dorsalis pulse intact bilaterally:  Yes Comments Callous on ball of LT big toe well shaved.  Callous on lateral aspect of both feet.      Results for orders placed or performed in visit on 03/19/18  POCT glucose (manual entry)  Result Value Ref Range   POC Glucose 81 70 - 99 mg/dl   ASSESSMENT AND PLAN: 1. Brain TIA -discussed importance of secondary prevention including good blood pressure and diabetes control and that he remains free of cigarettes.  He will pick up aspirin prescription today. - aspirin EC 81 MG tablet; Take 1 tablet (81 mg total) by mouth daily.  Dispense: 100 tablet; Refill: 1 - Ambulatory referral to Neurology  2. Essential hypertension Repeat blood pressure at goal.  Continue labetalol  3. Type 2 diabetes mellitus with complication, unspecified whether long term insulin use ([place]) Patient will go to the pharmacy today to pick up his diabetic supplies. - POCT glucose (manual entry)   Patient was given the opportunity to ask questions.  Patient verbalized understanding of the plan and was able to repeat key elements of the plan.   Orders Placed This Encounter  Procedures  . POCT glucose (manual entry)     Requested Prescriptions    No prescriptions requested or ordered in this encounter    No follow-ups on file.  Karle Plumber, MD, FACP

## 2018-03-19 NOTE — Telephone Encounter (Signed)
Call received from [place] with [facility] who confirmed that the patient has been certified for [facility] services.

## 2018-03-20 ENCOUNTER — Encounter (INDEPENDENT_AMBULATORY_CARE_PROVIDER_SITE_OTHER): Payer: Medicare Other | Admitting: Ophthalmology

## 2018-03-20 DIAGNOSIS — E1122 Type 2 diabetes mellitus with diabetic chronic kidney disease: Secondary | ICD-10-CM | POA: Diagnosis not present

## 2018-03-20 DIAGNOSIS — I5032 Chronic diastolic (congestive) heart failure: Secondary | ICD-10-CM | POA: Diagnosis not present

## 2018-03-20 DIAGNOSIS — S14105S Unspecified injury at C5 level of cervical spinal cord, sequela: Secondary | ICD-10-CM | POA: Diagnosis not present

## 2018-03-20 DIAGNOSIS — I130 Hypertensive heart and chronic kidney disease with heart failure and stage 1 through stage 4 chronic kidney disease, or unspecified chronic kidney disease: Secondary | ICD-10-CM | POA: Diagnosis not present

## 2018-03-20 DIAGNOSIS — H43813 Vitreous degeneration, bilateral: Secondary | ICD-10-CM

## 2018-03-20 DIAGNOSIS — E113393 Type 2 diabetes mellitus with moderate nonproliferative diabetic retinopathy without macular edema, bilateral: Secondary | ICD-10-CM

## 2018-03-20 DIAGNOSIS — E11319 Type 2 diabetes mellitus with unspecified diabetic retinopathy without macular edema: Secondary | ICD-10-CM

## 2018-03-20 DIAGNOSIS — G8194 Hemiplegia, unspecified affecting left nondominant side: Secondary | ICD-10-CM | POA: Diagnosis not present

## 2018-03-20 DIAGNOSIS — I10 Essential (primary) hypertension: Secondary | ICD-10-CM

## 2018-03-20 DIAGNOSIS — H35033 Hypertensive retinopathy, bilateral: Secondary | ICD-10-CM

## 2018-03-20 DIAGNOSIS — Z8673 Personal history of transient ischemic attack (TIA), and cerebral infarction without residual deficits: Secondary | ICD-10-CM | POA: Diagnosis not present

## 2018-03-21 DIAGNOSIS — I5032 Chronic diastolic (congestive) heart failure: Secondary | ICD-10-CM | POA: Diagnosis not present

## 2018-03-21 DIAGNOSIS — E1122 Type 2 diabetes mellitus with diabetic chronic kidney disease: Secondary | ICD-10-CM | POA: Diagnosis not present

## 2018-03-21 DIAGNOSIS — S14105S Unspecified injury at C5 level of cervical spinal cord, sequela: Secondary | ICD-10-CM | POA: Diagnosis not present

## 2018-03-21 DIAGNOSIS — G8194 Hemiplegia, unspecified affecting left nondominant side: Secondary | ICD-10-CM | POA: Diagnosis not present

## 2018-03-21 DIAGNOSIS — Z8673 Personal history of transient ischemic attack (TIA), and cerebral infarction without residual deficits: Secondary | ICD-10-CM | POA: Diagnosis not present

## 2018-03-21 DIAGNOSIS — I130 Hypertensive heart and chronic kidney disease with heart failure and stage 1 through stage 4 chronic kidney disease, or unspecified chronic kidney disease: Secondary | ICD-10-CM | POA: Diagnosis not present

## 2018-03-22 DIAGNOSIS — Z79899 Other long term (current) drug therapy: Secondary | ICD-10-CM | POA: Diagnosis not present

## 2018-03-25 DIAGNOSIS — E1122 Type 2 diabetes mellitus with diabetic chronic kidney disease: Secondary | ICD-10-CM | POA: Diagnosis not present

## 2018-03-25 DIAGNOSIS — I5032 Chronic diastolic (congestive) heart failure: Secondary | ICD-10-CM | POA: Diagnosis not present

## 2018-03-25 DIAGNOSIS — I130 Hypertensive heart and chronic kidney disease with heart failure and stage 1 through stage 4 chronic kidney disease, or unspecified chronic kidney disease: Secondary | ICD-10-CM | POA: Diagnosis not present

## 2018-03-25 DIAGNOSIS — Z8673 Personal history of transient ischemic attack (TIA), and cerebral infarction without residual deficits: Secondary | ICD-10-CM | POA: Diagnosis not present

## 2018-03-25 DIAGNOSIS — S14105S Unspecified injury at C5 level of cervical spinal cord, sequela: Secondary | ICD-10-CM | POA: Diagnosis not present

## 2018-03-25 DIAGNOSIS — G8194 Hemiplegia, unspecified affecting left nondominant side: Secondary | ICD-10-CM | POA: Diagnosis not present

## 2018-03-26 DIAGNOSIS — I5032 Chronic diastolic (congestive) heart failure: Secondary | ICD-10-CM | POA: Diagnosis not present

## 2018-03-26 DIAGNOSIS — E1122 Type 2 diabetes mellitus with diabetic chronic kidney disease: Secondary | ICD-10-CM | POA: Diagnosis not present

## 2018-03-26 DIAGNOSIS — Z8673 Personal history of transient ischemic attack (TIA), and cerebral infarction without residual deficits: Secondary | ICD-10-CM | POA: Diagnosis not present

## 2018-03-26 DIAGNOSIS — S14105S Unspecified injury at C5 level of cervical spinal cord, sequela: Secondary | ICD-10-CM | POA: Diagnosis not present

## 2018-03-26 DIAGNOSIS — I130 Hypertensive heart and chronic kidney disease with heart failure and stage 1 through stage 4 chronic kidney disease, or unspecified chronic kidney disease: Secondary | ICD-10-CM | POA: Diagnosis not present

## 2018-03-26 DIAGNOSIS — G8194 Hemiplegia, unspecified affecting left nondominant side: Secondary | ICD-10-CM | POA: Diagnosis not present

## 2018-03-27 DIAGNOSIS — S14105S Unspecified injury at C5 level of cervical spinal cord, sequela: Secondary | ICD-10-CM | POA: Diagnosis not present

## 2018-03-27 DIAGNOSIS — G8194 Hemiplegia, unspecified affecting left nondominant side: Secondary | ICD-10-CM | POA: Diagnosis not present

## 2018-03-27 DIAGNOSIS — I130 Hypertensive heart and chronic kidney disease with heart failure and stage 1 through stage 4 chronic kidney disease, or unspecified chronic kidney disease: Secondary | ICD-10-CM | POA: Diagnosis not present

## 2018-03-27 DIAGNOSIS — Z8673 Personal history of transient ischemic attack (TIA), and cerebral infarction without residual deficits: Secondary | ICD-10-CM | POA: Diagnosis not present

## 2018-03-27 DIAGNOSIS — E1122 Type 2 diabetes mellitus with diabetic chronic kidney disease: Secondary | ICD-10-CM | POA: Diagnosis not present

## 2018-03-27 DIAGNOSIS — I5032 Chronic diastolic (congestive) heart failure: Secondary | ICD-10-CM | POA: Diagnosis not present

## 2018-03-28 ENCOUNTER — Other Ambulatory Visit: Payer: Self-pay | Admitting: Internal Medicine

## 2018-03-28 DIAGNOSIS — E1122 Type 2 diabetes mellitus with diabetic chronic kidney disease: Secondary | ICD-10-CM | POA: Diagnosis not present

## 2018-03-28 DIAGNOSIS — G8194 Hemiplegia, unspecified affecting left nondominant side: Secondary | ICD-10-CM | POA: Diagnosis not present

## 2018-03-28 DIAGNOSIS — B356 Tinea cruris: Secondary | ICD-10-CM

## 2018-03-28 DIAGNOSIS — S14105S Unspecified injury at C5 level of cervical spinal cord, sequela: Secondary | ICD-10-CM | POA: Diagnosis not present

## 2018-03-28 DIAGNOSIS — I130 Hypertensive heart and chronic kidney disease with heart failure and stage 1 through stage 4 chronic kidney disease, or unspecified chronic kidney disease: Secondary | ICD-10-CM | POA: Diagnosis not present

## 2018-03-28 DIAGNOSIS — Z8673 Personal history of transient ischemic attack (TIA), and cerebral infarction without residual deficits: Secondary | ICD-10-CM | POA: Diagnosis not present

## 2018-03-28 DIAGNOSIS — I5032 Chronic diastolic (congestive) heart failure: Secondary | ICD-10-CM | POA: Diagnosis not present

## 2018-04-01 DIAGNOSIS — S14105S Unspecified injury at C5 level of cervical spinal cord, sequela: Secondary | ICD-10-CM | POA: Diagnosis not present

## 2018-04-01 DIAGNOSIS — G8194 Hemiplegia, unspecified affecting left nondominant side: Secondary | ICD-10-CM | POA: Diagnosis not present

## 2018-04-01 DIAGNOSIS — E1122 Type 2 diabetes mellitus with diabetic chronic kidney disease: Secondary | ICD-10-CM | POA: Diagnosis not present

## 2018-04-01 DIAGNOSIS — I5032 Chronic diastolic (congestive) heart failure: Secondary | ICD-10-CM | POA: Diagnosis not present

## 2018-04-01 DIAGNOSIS — Z79899 Other long term (current) drug therapy: Secondary | ICD-10-CM | POA: Diagnosis not present

## 2018-04-01 DIAGNOSIS — Z8673 Personal history of transient ischemic attack (TIA), and cerebral infarction without residual deficits: Secondary | ICD-10-CM | POA: Diagnosis not present

## 2018-04-01 DIAGNOSIS — I130 Hypertensive heart and chronic kidney disease with heart failure and stage 1 through stage 4 chronic kidney disease, or unspecified chronic kidney disease: Secondary | ICD-10-CM | POA: Diagnosis not present

## 2018-04-03 DIAGNOSIS — G8194 Hemiplegia, unspecified affecting left nondominant side: Secondary | ICD-10-CM | POA: Diagnosis not present

## 2018-04-03 DIAGNOSIS — I5032 Chronic diastolic (congestive) heart failure: Secondary | ICD-10-CM | POA: Diagnosis not present

## 2018-04-03 DIAGNOSIS — I130 Hypertensive heart and chronic kidney disease with heart failure and stage 1 through stage 4 chronic kidney disease, or unspecified chronic kidney disease: Secondary | ICD-10-CM | POA: Diagnosis not present

## 2018-04-03 DIAGNOSIS — S14105S Unspecified injury at C5 level of cervical spinal cord, sequela: Secondary | ICD-10-CM | POA: Diagnosis not present

## 2018-04-03 DIAGNOSIS — Z8673 Personal history of transient ischemic attack (TIA), and cerebral infarction without residual deficits: Secondary | ICD-10-CM | POA: Diagnosis not present

## 2018-04-03 DIAGNOSIS — E1122 Type 2 diabetes mellitus with diabetic chronic kidney disease: Secondary | ICD-10-CM | POA: Diagnosis not present

## 2018-04-05 DIAGNOSIS — Z79899 Other long term (current) drug therapy: Secondary | ICD-10-CM | POA: Diagnosis not present

## 2018-04-08 DIAGNOSIS — Z79899 Other long term (current) drug therapy: Secondary | ICD-10-CM | POA: Diagnosis not present

## 2018-04-12 DIAGNOSIS — Z79899 Other long term (current) drug therapy: Secondary | ICD-10-CM | POA: Diagnosis not present

## 2018-04-15 DIAGNOSIS — Z79899 Other long term (current) drug therapy: Secondary | ICD-10-CM | POA: Diagnosis not present

## 2018-04-22 DIAGNOSIS — Z79899 Other long term (current) drug therapy: Secondary | ICD-10-CM | POA: Diagnosis not present

## 2018-04-24 ENCOUNTER — Other Ambulatory Visit: Payer: Self-pay | Admitting: Internal Medicine

## 2018-04-24 DIAGNOSIS — I10 Essential (primary) hypertension: Secondary | ICD-10-CM

## 2018-04-24 DIAGNOSIS — Z79899 Other long term (current) drug therapy: Secondary | ICD-10-CM | POA: Diagnosis not present

## 2018-04-26 ENCOUNTER — Ambulatory Visit (INDEPENDENT_AMBULATORY_CARE_PROVIDER_SITE_OTHER): Payer: Medicare Other | Admitting: Adult Health

## 2018-04-26 ENCOUNTER — Encounter: Payer: Self-pay | Admitting: Adult Health

## 2018-04-26 VITALS — BP 129/74 | HR 58 | Ht 75.0 in | Wt 294.2 lb

## 2018-04-26 DIAGNOSIS — G459 Transient cerebral ischemic attack, unspecified: Secondary | ICD-10-CM

## 2018-04-26 DIAGNOSIS — E785 Hyperlipidemia, unspecified: Secondary | ICD-10-CM | POA: Diagnosis not present

## 2018-04-26 DIAGNOSIS — E118 Type 2 diabetes mellitus with unspecified complications: Secondary | ICD-10-CM | POA: Diagnosis not present

## 2018-04-26 DIAGNOSIS — I10 Essential (primary) hypertension: Secondary | ICD-10-CM | POA: Diagnosis not present

## 2018-04-26 NOTE — Patient Instructions (Signed)
Continue aspirin 81 mg daily  and lipitor 80mg   for secondary stroke prevention  Continue to follow up with PCP regarding cholesterol, diabetes and blood pressure management   Continue to stay active and maintain a healthy diet  Continue to monitor blood pressure at home  Maintain strict control of hypertension with blood pressure goal below 130/90, diabetes with hemoglobin A1c goal below 6.5% and cholesterol with LDL cholesterol (bad cholesterol) goal below 70 mg/dL. I also advised the patient to eat a healthy diet with plenty of whole grains, cereals, fruits and vegetables, exercise regularly and maintain ideal body weight.  Followup in the future with me in 6 months or call earlier if needed       Thank you for coming to see [name] at [facility]. I hope we have been able to provide you high quality care today.  You may receive a patient satisfaction survey over the next few weeks. We would appreciate your feedback and comments so that we may continue to improve ourselves and the health of our patients.

## 2018-04-26 NOTE — Progress Notes (Signed)
I agree with the above plan 

## 2018-04-26 NOTE — Progress Notes (Signed)
[facility] [address] [place]. [place] [address] [phone]       OFFICE FOLLOW UP NOTE  [name] Date of Birth:  18-Oct-1960 Medical Record Number:  [id number]   Reason for Referral:  hospital stroke follow up  CHIEF COMPLAINT:  Chief Complaint  Patient presents with  . New Patient (Initial Visit)    former patient of Dr. Krista Blue, last seen 06/2015.     HPI: [name] is being seen today for initial visit in the office for TIA versus conversion disorder on 03/04/2018. History obtained from patient and chart review. Reviewed all radiology images and labs personally.  Mr. LASHAWN [name] is a 57 y.o. male with history of HTN, HLD, DB, obesity, CAD/STEMI, CHF, alcohol use, cocaine abuse, CKD, congenital hydrocephalus, traumatic spinal cord contusion in 12/2013 with resultant L HP (no documented stroke) who presented to [facility] on 03/04/2018 with R sided weakness.  CT had reviewed and was negative for acute abnormalities but did show evidence of small vessel disease.  MRI head reviewed and was negative for acute stroke.  MRA was negative for emergent LVO but did show some narrowing in the left ICA which was unchanged from prior imaging in 2016.  Carotid Doppler showed bilateral ICA stenosis of 1 to 39% with VAs antegrade.  2D echo showed an EF of 65 to 70% without evidence of cardiac source of embolus.  LDL 98 and recommended increase from Lipitor 40 mg to Lipitor 80 mg daily.  HTN stable during admission and recommended long-term BP goal normotensive range.  A1c 7.0 and recommended continue follow-up with PCP for DM management.  Current smoker and recommended smoking cessation with counseling provided during admission.  Per notes, current condition concern for TIA versus conversion disorder as MRI negative and previous diagnosis of conversion disorder.  Patient was not compliant with aspirin 81 mg PTA and recommended to restart aspirin 81 mg daily with importance of  compliance.  Patient was discharged home in stable condition with recommendations of home health PT/OT.  Patient is being seen today for hospital follow-up.  He has since completed home therapies and has resumed back to all prior activities.  He states all prior right-sided weakness has resolved but continues to have some left hemiparesis which is chronic.  He states compliance with aspirin 81 mg and denies any bleeding or bruising.  Continues to take increased dose of Lipitor without side effects myalgias.  Blood pressure today satisfactory 129/74.  Does monitor glucose levels at home and typically range from 100-1 50.  He does live at home independently but has an aide come in daily to assist with ADLs and IADLs.  He uses a cane for ambulation due to chronic left hemiparesis.  Denies current tobacco use or recreational drug use.  Denies new or worsening stroke/TIA symptoms. On a side note, patient has complaints of bilateral upper extremity and back of head "rash" which she states has been present for approximately 2 months and was seen by his PCP in regards to this and per patient, was told that it was a "summer rash".  He does endorse pruritus without improvement.    ROS:   14 system review of systems performed and negative with exception of numbness and weakness and rash  PMH:  Past Medical History:  Diagnosis Date  . Alcohol abuse    11-15-2017  per pt last alcohol Dec 2018  . BPH with obstruction/lower urinary tract symptoms   . Chronic  arm pain   . Chronic pain    arms, leg, back  . CKD (chronic kidney disease), stage II   . Cocaine abuse ([place])    11-15-2017  per pt last used March 2019  . Congenital hydrocephalus, unspecified (HCC)    slow progression  . Diabetic retinopathy of both eyes ([place])   . Diastolic CHF, chronic ([place]) 07/2017  . Gait instability    multifactorial -- slow worsening congenital hydrocephalus, peripheral neuropathy, left C5-6 cord lesion  . Hematuria   .  History of acute pulmonary edema 07/2017  . History of spinal cord injury 01/02/2014   pt fell, caused spinal cord contusion at C5-6--  residual left side weakness    . 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 History of TIA (transient ischemic attack) and stroke    hx cva 12-20-2010 and  TIA 02-22-2011--- no residual's from cva or tia  . Hyperlipidemia   . Hypertension    followed by pcp  . 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 Lesion of bladder   . Peripheral edema    chronic LUE  . Peripheral neuropathy   . Type 2 diabetes mellitus ([place])    followed by dr Karle Plumber--  last A1c 6.8 on 11-06-2017  . Weak urinary stream     PSH:  Past Surgical History:  Procedure Laterality Date  . CARDIAC CATHETERIZATION  12/01/2008   dr hochrein   abnormal stress myoview:  mild coronary plaque, normal LVF  . CARDIAC CATHETERIZATION  04/29/2011   dr [place]   in setting ECG with new ST elevation & severe hypertensive:  nonobstructive atherosclerotic CAD, normal LVF (30% mRCA)   . CARDIOVASCULAR STRESS TEST  08/15/2010   Low nuclear study w/ no evidence ishemia/  ef 42% with lateral and apical hypokinesis  . CYSTOSCOPY WITH BIOPSY N/A 11/20/2017   Procedure: CYSTOSCOPY WITH BIOPSY AND FULGURATION;  Surgeon: [name], MD;  Location: [facility];  Service: Urology;  Laterality: N/A;  . INCISION AND DRAINAGE RIGHT DISTAL MEDIAL THIGH HEMATOMA  04-14-2005   dr Ninfa Linden  . left arm skin graft  1976   injury  . LEFT HEART CATHETERIZATION WITH CORONARY ANGIOGRAM Bilateral 12/26/2013   Procedure: LEFT HEART CATHETERIZATION WITH CORONARY ANGIOGRAM;  Surgeon: Burnell Blanks, MD;  Location: [facility] CATH LAB;  Service: Cardiovascular;  Laterality: Bilateral;  STEMI, in setting cocaine/ alcohol :  mid disease in the RCA (20%), moderate disease in the small caliber intermediate branch (distal  50-60%), normal LVSF (ef 55-60%)  . TRANSTHORACIC ECHOCARDIOGRAM  08/05/2017   moderate LVH,  ef 55%,  grade 1 diastolic dysfunction/  mild LAE/  trivial Tr    Social History:  Social History   Socioeconomic History  . Marital status: Divorced    Spouse name: Not on file  . Number of children: 0  . Years of education: 10  . Highest education level: Not on file  Occupational History  . Occupation: Unemployed  Social Needs  . Financial resource strain: Not on file  . Food insecurity:    Worry: Not on file    Inability: Not on file  . Transportation needs:    Medical: Not on file    Non-medical: Not on file  Tobacco Use  . Smoking status: Former Smoker    Years: 45.00    Types: Cigarettes    Last attempt to quit: 03/03/2018  Years since quitting: 0.1  . Smokeless tobacco: Never Used  . Tobacco comment: 11-15-2017  per pt 1pp3days  Substance and Sexual Activity  . Alcohol use: Not Currently    Alcohol/week: 0.0 standard drinks    Comment: 11-15-2017  per pt last alcohol 12/ 2018  alcohol abuse  . Drug use: Yes    Types: "Crack" cocaine, Cocaine    Comment: 11-15-2017  per pt last used March 2019  . Sexual activity: Not on file  Lifestyle  . Physical activity:    Days per week: Not on file    Minutes per session: Not on file  . Stress: Not on file  Relationships  . Social connections:    Talks on phone: Not on file    Gets together: Not on file    Attends religious service: Not on file    Active member of club or organization: Not on file    Attends meetings of clubs or organizations: Not on file    Relationship status: Not on file  . Intimate partner violence:    Fear of current or ex partner: Not on file    Emotionally abused: Not on file    Physically abused: Not on file    Forced sexual activity: Not on file  Other Topics Concern  . Not on file  Social History Narrative   Lives at home alone.   Right-handed.   Occasional use.    Family History:    Family History  Problem Relation Age of Onset  . Hypertension Mother   . Diabetes Mother   . Cancer Mother        breast cancer   . Hypertension Father   . Heart disease Father   . Stroke Father   . Hypertension Sister     Medications:   Current Outpatient Medications on File Prior to Visit  Medication Sig Dispense Refill  . aspirin EC 81 MG tablet Take 1 tablet (81 mg total) by mouth daily. 100 tablet 1  . atorvastatin (LIPITOR) 80 MG tablet Take 1 tablet (80 mg total) by mouth daily. 30 tablet 0  . Blood Glucose Monitoring Suppl (TRUE METRIX METER) w/Device KIT Use as directed 1 kit 0  . cetirizine (ZYRTEC) 10 MG tablet Take 1 tablet (10 mg total) by mouth daily. 30 tablet 11  . furosemide (LASIX) 40 MG tablet Take 1 tablet (40 mg total) by mouth every morning. (Patient taking differently: Take 40 mg by mouth daily. ) 30 tablet 6  . gabapentin (NEURONTIN) 300 MG capsule TAKE 1 CAPSULE (300 MG TOTAL) BY MOUTH THREE TIMES DAILY. 270 capsule 3  . glipiZIDE (GLIPIZIDE XL) 10 MG 24 hr tablet Take 1 tablet (10 mg total) by mouth daily with breakfast. 30 tablet 2  . glucose blood (TRUE METRIX BLOOD GLUCOSE TEST) test strip Check blood sugars 1-2 times daily 100 each 12  . labetalol (NORMODYNE) 100 MG tablet TAKE 0.5 TABLETS (50 MG TOTAL) BY MOUTH TWICE A DAY (Patient taking differently: Take 100 mg by mouth 2 (two) times daily. ) 30 tablet 2  . Lancets Misc. (ACCU-CHEK SOFTCLIX LANCET DEV) KIT Use as directed 1 kit 0  . metFORMIN (GLUCOPHAGE) 1000 MG tablet Take 1 tablet (1,000 mg total) by mouth 2 (two) times daily with a meal. 60 tablet 2  . TRUEPLUS LANCETS 28G MISC Use as directed 100 each 6  . lisinopril (PRINIVIL,ZESTRIL) 40 MG tablet Take 1 tablet by mouth daily.     No current facility-administered  medications on file prior to visit.     Allergies:   Allergies  Allergen Reactions  . Amlodipine Swelling    Lower extremity swelling     Physical Exam  Vitals:   04/26/18  0849  BP: 129/74  Pulse: (!) 58  Weight: 294 lb 3.2 oz (133.4 kg)  Height: '6\' 3"'  (1.905 m)   Body mass index is 36.77 kg/m. No exam data present  General: well developed, well nourished, pleasant middle-aged African-American male, seated, in no evident distress Head: head normocephalic and atraumatic.   Neck: supple with no carotid or supraclavicular bruits Cardiovascular: regular rate and rhythm, no murmurs Musculoskeletal: no deformity Skin:  no rash/petichiae Vascular:  Normal pulses all extremities  Neurologic Exam Mental Status: Awake and fully alert. Oriented to place and time. Recent and remote memory intact. Attention span, concentration and fund of knowledge appropriate. Mood and affect appropriate.  Cranial Nerves: Fundoscopic exam reveals sharp disc margins. Pupils equal, briskly reactive to light. Extraocular movements full without nystagmus. Visual fields full to confrontation. Hearing intact. Facial sensation intact. Face, tongue, palate moves normally and symmetrically.  Motor: Normal bulk and tone. Normal strength in all tested extremity muscles except for mild chronic left hemiparesis Sensory.: intact to touch , pinprick , position and vibratory sensation.  Coordination: Rapid alternating movements normal in all extremities. Finger-to-nose and heel-to-shin performed accurately bilaterally. Gait and Station: Arises from chair without difficulty. Stance is normal. Gait demonstrates short shuffled gait with assistance of cane Reflexes: 1+ and symmetric. Toes downgoing.    NIHSS  0 Modified Rankin  1    Diagnostic Data (Labs, Imaging, Testing)  CT HEAD WO CONTRAST 03/04/2018 IMPRESSION: No acute intracranial abnormalities. Mild diffuse cerebral atrophy and small vessel ischemic changes. Ventricular dilatation is likely due to central atrophy.  MR BRAIN WO CONTRAST 03/05/2018 IMPRESSION: 1. No acute intracranial abnormality. 2. Stable moderate diffuse volume  loss of the brain and absent septum pellucidum.  MR MRA HEAD  03/05/2018 IMPRESSION: No emergent large vessel occlusion. Narrowing of the distal cavernous segment of the left internal carotid artery is unchanged from the CTA 04/26/2015.  VAS US CAROTID DUPLEX BILATERAL 03/05/2018 Final Interpretation: Right Carotid: Velocities in the right ICA are consistent with a 1-39% stenosis Left Carotid: Velocities in the left ICA are consistent with a 1-39% stenosis. Vertebrals: Bilateral vertebral arteries demonstrate antegrade flow.  ECHOCARDIOGRAM 03/05/2018 Study Conclusions  - Left ventricle: The cavity size was mildly dilated. There was   mild concentric hypertrophy. Systolic function was vigorous. The   estimated ejection fraction was in the range of 65% to 70%. Wall   motion was normal; there were no regional wall motion   abnormalities. Features are consistent with a pseudonormal left   ventricular filling pattern, with concomitant abnormal relaxation   and increased filling pressure (grade 2 diastolic dysfunction). - Left atrium: The atrium was mildly dilated. - Pulmonary arteries: Systolic pressure could not be accurately   estimated.    ASSESSMENT: [name] is a 57 y.o. year old male here with TIA versus conversion disorder on 03/04/2018. Vascular risk factors include HTN, HLD, DM, obesity, CAD/STEMI, CHF, alcohol use, cocaine abuse, CKD, congenital hydrocephalus, and traumatic spinal cord contusion.  Patient is being seen today for hospital follow-up and has recovered well without residual right hemiparesis.    PLAN: -Continue aspirin 81 mg daily  and Lipitor 80 mg for secondary stroke prevention -F/u with PCP regarding your HLD, HTN and DM management -Advised to follow-up with  PCP in regards to rash with possible referral to dermatology if needed -Educated on importance of compliance with all medications along with controlling of risk factors and avoiding tobacco,  alcohol or recreational drug use -continue to monitor BP at home -advised to continue to stay active and maintain a healthy diet -Maintain strict control of hypertension with blood pressure goal below 130/90, diabetes with hemoglobin A1c goal below 6.5% and cholesterol with LDL cholesterol (bad cholesterol) goal below 70 mg/dL. I also advised the patient to eat a healthy diet with plenty of whole grains, cereals, fruits and vegetables, exercise regularly and maintain ideal body weight.  Follow up in 6 months or call earlier if needed   Greater than 50% of time during this 25 minute visit was spent on counseling,explanation of diagnosis of TIA versus conversion disorder, reviewing risk factor management of HTN, HLD, DM, prior tobacco use and prior recreational drug use, planning of further management, discussion with patient and family and coordination of care    Venancio Poisson, AGNP-BC  [facility] [address] [place], [place] [address]  Phone [phone] Fax [phone] Note: This document was prepared with digital dictation and possible smart phrase technology. Any transcriptional errors that result from this process are unintentional.

## 2018-04-29 DIAGNOSIS — Z79899 Other long term (current) drug therapy: Secondary | ICD-10-CM | POA: Diagnosis not present

## 2018-05-01 DIAGNOSIS — Z79899 Other long term (current) drug therapy: Secondary | ICD-10-CM | POA: Diagnosis not present

## 2018-05-06 DIAGNOSIS — Z79899 Other long term (current) drug therapy: Secondary | ICD-10-CM | POA: Diagnosis not present

## 2018-05-13 ENCOUNTER — Other Ambulatory Visit: Payer: Self-pay

## 2018-05-13 ENCOUNTER — Encounter (HOSPITAL_BASED_OUTPATIENT_CLINIC_OR_DEPARTMENT_OTHER)
Admission: RE | Admit: 2018-05-13 | Discharge: 2018-05-13 | Disposition: A | Discharge status: Home / Self Care | Payer: Medicare Other | Source: Ambulatory Visit | Attending: Oral Surgery | Admitting: Oral Surgery

## 2018-05-13 ENCOUNTER — Encounter (HOSPITAL_BASED_OUTPATIENT_CLINIC_OR_DEPARTMENT_OTHER): Payer: Self-pay | Admitting: *Deleted

## 2018-05-13 DIAGNOSIS — Z6836 Body mass index (BMI) 36.0-36.9, adult: Secondary | ICD-10-CM | POA: Diagnosis not present

## 2018-05-13 DIAGNOSIS — E785 Hyperlipidemia, unspecified: Secondary | ICD-10-CM | POA: Diagnosis not present

## 2018-05-13 DIAGNOSIS — R2689 Other abnormalities of gait and mobility: Secondary | ICD-10-CM | POA: Diagnosis not present

## 2018-05-13 DIAGNOSIS — E669 Obesity, unspecified: Secondary | ICD-10-CM | POA: Diagnosis not present

## 2018-05-13 DIAGNOSIS — Z79899 Other long term (current) drug therapy: Secondary | ICD-10-CM | POA: Diagnosis not present

## 2018-05-13 DIAGNOSIS — Z7984 Long term (current) use of oral hypoglycemic drugs: Secondary | ICD-10-CM | POA: Diagnosis not present

## 2018-05-13 DIAGNOSIS — K061 Gingival enlargement: Secondary | ICD-10-CM | POA: Diagnosis not present

## 2018-05-13 DIAGNOSIS — E1140 Type 2 diabetes mellitus with diabetic neuropathy, unspecified: Secondary | ICD-10-CM | POA: Diagnosis not present

## 2018-05-13 DIAGNOSIS — E1122 Type 2 diabetes mellitus with diabetic chronic kidney disease: Secondary | ICD-10-CM | POA: Diagnosis not present

## 2018-05-13 DIAGNOSIS — R531 Weakness: Secondary | ICD-10-CM | POA: Diagnosis not present

## 2018-05-13 DIAGNOSIS — Z01812 Encounter for preprocedural laboratory examination: Secondary | ICD-10-CM | POA: Insufficient documentation

## 2018-05-13 DIAGNOSIS — Z7982 Long term (current) use of aspirin: Secondary | ICD-10-CM | POA: Diagnosis not present

## 2018-05-13 DIAGNOSIS — I252 Old myocardial infarction: Secondary | ICD-10-CM | POA: Diagnosis not present

## 2018-05-13 DIAGNOSIS — I5032 Chronic diastolic (congestive) heart failure: Secondary | ICD-10-CM | POA: Diagnosis not present

## 2018-05-13 DIAGNOSIS — I130 Hypertensive heart and chronic kidney disease with heart failure and stage 1 through stage 4 chronic kidney disease, or unspecified chronic kidney disease: Secondary | ICD-10-CM | POA: Diagnosis not present

## 2018-05-13 DIAGNOSIS — M270 Developmental disorders of jaws: Secondary | ICD-10-CM | POA: Diagnosis not present

## 2018-05-13 DIAGNOSIS — K029 Dental caries, unspecified: Secondary | ICD-10-CM | POA: Diagnosis not present

## 2018-05-13 DIAGNOSIS — I2510 Atherosclerotic heart disease of native coronary artery without angina pectoris: Secondary | ICD-10-CM | POA: Diagnosis not present

## 2018-05-13 DIAGNOSIS — N182 Chronic kidney disease, stage 2 (mild): Secondary | ICD-10-CM | POA: Diagnosis not present

## 2018-05-13 DIAGNOSIS — Z8673 Personal history of transient ischemic attack (TIA), and cerebral infarction without residual deficits: Secondary | ICD-10-CM | POA: Diagnosis not present

## 2018-05-13 DIAGNOSIS — M2679 Other specified alveolar anomalies: Secondary | ICD-10-CM | POA: Diagnosis not present

## 2018-05-13 DIAGNOSIS — Z888 Allergy status to other drugs, medicaments and biological substances status: Secondary | ICD-10-CM | POA: Diagnosis not present

## 2018-05-13 DIAGNOSIS — Z87891 Personal history of nicotine dependence: Secondary | ICD-10-CM | POA: Diagnosis not present

## 2018-05-13 LAB — BASIC METABOLIC PANEL
Anion gap: 8 (ref 5–15)
BUN: 21 mg/dL — AB (ref 6–20)
CHLORIDE: 105 mmol/L (ref 98–111)
CO2: 26 mmol/L (ref 22–32)
CREATININE: 1.16 mg/dL (ref 0.61–1.24)
Calcium: 9.2 mg/dL (ref 8.9–10.3)
GFR calc Af Amer: >60 mL/min (ref >60)
GFR calc non Af Amer: >60 mL/min (ref >60)
GLUCOSE: 171 mg/dL — AB (ref 70–99)
Potassium: 4.8 mmol/L (ref 3.5–5.1)
SODIUM: 139 mmol/L (ref 135–145)

## 2018-05-13 NOTE — H&P (Signed)
HISTORY AND PHYSICAL  [name] is a 57 y.o. male patient for removal teeth in preparation for denture fabrication.   No diagnosis found.  Past Medical History:  Diagnosis Date  . Alcohol abuse    11-15-2017  per pt last alcohol Dec 2018  . BPH with obstruction/lower urinary tract symptoms   . Chronic arm pain   . Chronic pain    arms, leg, back  . CKD (chronic kidney disease), stage II   . Cocaine abuse ([place])    11-15-2017  per pt last used March 2019  . Congenital hydrocephalus, unspecified (HCC)    slow progression  . Diabetic retinopathy of both eyes ([place])   . Diastolic CHF, chronic ([place]) 07/2017  . Gait instability    multifactorial -- slow worsening congenital hydrocephalus, peripheral neuropathy, left C5-6 cord lesion  . Hematuria   . History of acute pulmonary edema 07/2017  . History of spinal cord injury 01/02/2014   pt fell, caused spinal cord contusion at C5-6--  residual left side weakness    . 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 History of TIA (transient ischemic attack) and stroke    hx cva 12-20-2010 and  TIA 02-22-2011--- no residual's from cva or tia  . Hyperlipidemia   . Hypertension    followed by pcp  . 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 Lesion of bladder   . Peripheral edema    chronic LUE  . Peripheral neuropathy   . Type 2 diabetes mellitus ([place])    followed by dr Karle Plumber--  last A1c 6.8 on 11-06-2017  . Weak urinary stream     No current facility-administered medications for this encounter.    Current Outpatient Medications  Medication Sig Dispense Refill  . aspirin EC 81 MG tablet Take 1 tablet (81 mg total) by mouth daily. 100 tablet 1  . atorvastatin (LIPITOR) 80 MG tablet Take 1 tablet (80 mg total) by mouth daily. 30 tablet 0  . cetirizine (ZYRTEC) 10 MG tablet Take 1 tablet (10 mg total) by mouth daily. 30 tablet 11   . furosemide (LASIX) 40 MG tablet Take 1 tablet (40 mg total) by mouth every morning. (Patient taking differently: Take 40 mg by mouth daily. ) 30 tablet 6  . gabapentin (NEURONTIN) 300 MG capsule TAKE 1 CAPSULE (300 MG TOTAL) BY MOUTH THREE TIMES DAILY. 270 capsule 3  . glipiZIDE (GLIPIZIDE XL) 10 MG 24 hr tablet Take 1 tablet (10 mg total) by mouth daily with breakfast. 30 tablet 2  . labetalol (NORMODYNE) 100 MG tablet TAKE 0.5 TABLETS (50 MG TOTAL) BY MOUTH TWICE A DAY (Patient taking differently: Take 100 mg by mouth 2 (two) times daily. ) 30 tablet 2  . lisinopril (PRINIVIL,ZESTRIL) 40 MG tablet Take 1 tablet by mouth daily.    . metFORMIN (GLUCOPHAGE) 1000 MG tablet Take 1 tablet (1,000 mg total) by mouth 2 (two) times daily with a meal. 60 tablet 2  . Blood Glucose Monitoring Suppl (TRUE METRIX METER) w/Device KIT Use as directed 1 kit 0  . glucose blood (TRUE METRIX BLOOD GLUCOSE TEST) test strip Check blood sugars 1-2 times daily 100 each 12  . Lancets Misc. (ACCU-CHEK SOFTCLIX LANCET DEV) KIT Use as directed 1 kit 0  . TRUEPLUS LANCETS 28G MISC Use as directed 100 each 6   Allergies  Allergen Reactions  . Amlodipine Swelling  Lower extremity swelling   Active Problems:   * No active hospital problems. *  Vitals: Height '6\' 3"'  (1.905 m), weight 133.4 kg. Lab results:No results found for this or any previous visit (from the past 78 hour(s)). Radiology Results: No results found. General appearance: alert, cooperative and no distress Head: Normocephalic, without obvious abnormality, atraumatic Eyes: negative Nose: Nares normal. Septum midline. Mucosa normal. No drainage or sinus tenderness. Throat: dental caries maxillary teeth. Excessive vertical growth maxillary ridge from area of tooth 32 to #10 impinging on lower dentition.; Pharynx clear Neck: no adenopathy, supple, symmetrical, trachea midline and thyroid not enlarged, symmetric, no tenderness/mass/nodules Resp: clear to  auscultation bilaterally Cardio: regular rate and rhythm, S1, S2 normal, no murmur, click, rub or gallop  Assessment: Nonrestorable teeth secondary to dental caries, hyperplastic maxillary alveolar ridge  Plan: Dental extractions, alveoloplasty. Reduction alveolar ridge height. GA Day surgery.    Diona Browner 05/13/2018

## 2018-05-15 NOTE — Anesthesia Preprocedure Evaluation (Addendum)
Anesthesia Evaluation  Patient identified by MRN, date of birth, ID band Patient awake    Reviewed: Allergy & Precautions, NPO status , Patient's Chart, lab work & pertinent test results  Airway Mallampati: III  TM Distance: >3 FB Neck ROM: Full    Dental  (+) Missing,    Pulmonary Current Smoker, former smoker,    Pulmonary exam normal breath sounds clear to auscultation       Cardiovascular hypertension, Pt. on medications and Pt. on home beta blockers + CAD and + Past MI  Normal cardiovascular exam Rhythm:Regular Rate:Normal  ECHO 8/19  Left ventricle: The cavity size was mildly dilated. There was   mild concentric hypertrophy. Systolic function was vigorous. The   estimated ejection fraction was in the range of 65% to 70%. Wall   motion was normal; there were no regional wall motion   abnormalities. Features are consistent with a pseudonormal left   ventricular filling pattern, with concomitant abnormal relaxation   and increased filling pressure (grade 2 diastolic dysfunction).   Neuro/Psych PSYCHIATRIC DISORDERS Left-sided weakness Gait instability  Carotid Duplex 8/19 Right and Left Carotid: Velocities in the right ICA are consistent with a 1-39% stenosis. Vertebrals: Bilateral vertebral arteries demonstrate antegrade flow. TIA Neuromuscular disease    GI/Hepatic negative GI ROS, (+)     substance abuse  ,   Endo/Other  diabetes, Oral Hypoglycemic Agents  Renal/GU Renal disease     Musculoskeletal Chronic pain   Abdominal (+) + obese,   Peds  Hematology HLD   Anesthesia Other Findings BLADDER LESIONS Ambulates with cane  Reproductive/Obstetrics                           Anesthesia Physical  Anesthesia Plan  ASA: III  Anesthesia Plan: General   Post-op Pain Management:    Induction: Intravenous  PONV Risk Score and Plan: 2 and Ondansetron, Dexamethasone and Treatment  may vary due to age or medical condition  Airway Management Planned: Oral ETT  Additional Equipment:   Intra-op Plan:   Post-operative Plan: Extubation in OR  Informed Consent: I have reviewed the patients History and Physical, chart, labs and discussed the procedure including the risks, benefits and alternatives for the proposed anesthesia with the patient or authorized representative who has indicated his/her understanding and acceptance.   Dental advisory given  Plan Discussed with: CRNA, Anesthesiologist and Surgeon  Anesthesia Plan Comments: (  )        Anesthesia Quick Evaluation

## 2018-05-16 ENCOUNTER — Ambulatory Visit (HOSPITAL_BASED_OUTPATIENT_CLINIC_OR_DEPARTMENT_OTHER): Payer: Medicare Other | Admitting: Anesthesiology

## 2018-05-16 ENCOUNTER — Encounter (HOSPITAL_BASED_OUTPATIENT_CLINIC_OR_DEPARTMENT_OTHER)
Admission: RE | Disposition: A | Discharge status: Home / Self Care | Payer: Self-pay | Source: Ambulatory Visit | Attending: Oral Surgery

## 2018-05-16 ENCOUNTER — Other Ambulatory Visit: Payer: Self-pay

## 2018-05-16 ENCOUNTER — Encounter (HOSPITAL_BASED_OUTPATIENT_CLINIC_OR_DEPARTMENT_OTHER): Payer: Self-pay | Admitting: *Deleted

## 2018-05-16 ENCOUNTER — Ambulatory Visit (HOSPITAL_COMMUNITY)
Admission: RE | Admit: 2018-05-16 | Discharge: 2018-05-16 | Disposition: A | Discharge status: Home / Self Care | Payer: Medicare Other | Source: Ambulatory Visit | Attending: Oral Surgery | Admitting: Oral Surgery

## 2018-05-16 DIAGNOSIS — E1140 Type 2 diabetes mellitus with diabetic neuropathy, unspecified: Secondary | ICD-10-CM | POA: Insufficient documentation

## 2018-05-16 DIAGNOSIS — I2510 Atherosclerotic heart disease of native coronary artery without angina pectoris: Secondary | ICD-10-CM | POA: Insufficient documentation

## 2018-05-16 DIAGNOSIS — Z7984 Long term (current) use of oral hypoglycemic drugs: Secondary | ICD-10-CM | POA: Insufficient documentation

## 2018-05-16 DIAGNOSIS — R2689 Other abnormalities of gait and mobility: Secondary | ICD-10-CM | POA: Insufficient documentation

## 2018-05-16 DIAGNOSIS — E1122 Type 2 diabetes mellitus with diabetic chronic kidney disease: Secondary | ICD-10-CM | POA: Insufficient documentation

## 2018-05-16 DIAGNOSIS — N182 Chronic kidney disease, stage 2 (mild): Secondary | ICD-10-CM | POA: Diagnosis not present

## 2018-05-16 DIAGNOSIS — I252 Old myocardial infarction: Secondary | ICD-10-CM | POA: Insufficient documentation

## 2018-05-16 DIAGNOSIS — Z87891 Personal history of nicotine dependence: Secondary | ICD-10-CM | POA: Insufficient documentation

## 2018-05-16 DIAGNOSIS — Z6836 Body mass index (BMI) 36.0-36.9, adult: Secondary | ICD-10-CM | POA: Insufficient documentation

## 2018-05-16 DIAGNOSIS — M2607 Excessive tuberosity of jaw: Secondary | ICD-10-CM | POA: Diagnosis not present

## 2018-05-16 DIAGNOSIS — M270 Developmental disorders of jaws: Secondary | ICD-10-CM | POA: Insufficient documentation

## 2018-05-16 DIAGNOSIS — K029 Dental caries, unspecified: Secondary | ICD-10-CM | POA: Insufficient documentation

## 2018-05-16 DIAGNOSIS — M2679 Other specified alveolar anomalies: Secondary | ICD-10-CM | POA: Diagnosis not present

## 2018-05-16 DIAGNOSIS — E119 Type 2 diabetes mellitus without complications: Secondary | ICD-10-CM | POA: Diagnosis not present

## 2018-05-16 DIAGNOSIS — I10 Essential (primary) hypertension: Secondary | ICD-10-CM | POA: Diagnosis not present

## 2018-05-16 DIAGNOSIS — Z79899 Other long term (current) drug therapy: Secondary | ICD-10-CM | POA: Insufficient documentation

## 2018-05-16 DIAGNOSIS — E785 Hyperlipidemia, unspecified: Secondary | ICD-10-CM | POA: Insufficient documentation

## 2018-05-16 DIAGNOSIS — Z7982 Long term (current) use of aspirin: Secondary | ICD-10-CM | POA: Insufficient documentation

## 2018-05-16 DIAGNOSIS — I5032 Chronic diastolic (congestive) heart failure: Secondary | ICD-10-CM | POA: Diagnosis not present

## 2018-05-16 DIAGNOSIS — Z888 Allergy status to other drugs, medicaments and biological substances status: Secondary | ICD-10-CM | POA: Insufficient documentation

## 2018-05-16 DIAGNOSIS — R531 Weakness: Secondary | ICD-10-CM | POA: Insufficient documentation

## 2018-05-16 DIAGNOSIS — I130 Hypertensive heart and chronic kidney disease with heart failure and stage 1 through stage 4 chronic kidney disease, or unspecified chronic kidney disease: Secondary | ICD-10-CM | POA: Insufficient documentation

## 2018-05-16 DIAGNOSIS — Z8673 Personal history of transient ischemic attack (TIA), and cerebral infarction without residual deficits: Secondary | ICD-10-CM | POA: Insufficient documentation

## 2018-05-16 DIAGNOSIS — K061 Gingival enlargement: Secondary | ICD-10-CM | POA: Diagnosis not present

## 2018-05-16 DIAGNOSIS — E669 Obesity, unspecified: Secondary | ICD-10-CM | POA: Insufficient documentation

## 2018-05-16 HISTORY — PX: TOOTH EXTRACTION: SHX859

## 2018-05-16 LAB — GLUCOSE, CAPILLARY
Glucose-Capillary: 113 mg/dL — ABNORMAL HIGH (ref 70–99)
Glucose-Capillary: 176 mg/dL — ABNORMAL HIGH (ref 70–99)

## 2018-05-16 SURGERY — DENTAL RESTORATION/EXTRACTIONS
Anesthesia: General | Site: Mouth

## 2018-05-16 MED ORDER — MIDAZOLAM HCL 2 MG/2ML IJ SOLN
1.0000 mg | INTRAMUSCULAR | Status: DC | PRN
  Administered 2018-05-16: 2 mg via INTRAVENOUS

## 2018-05-16 MED ORDER — CEFAZOLIN SODIUM-DEXTROSE 1-4 GM/50ML-% IV SOLN
INTRAVENOUS | Status: AC
  Filled 2018-05-16: qty 50

## 2018-05-16 MED ORDER — CEFAZOLIN SODIUM-DEXTROSE 2-4 GM/100ML-% IV SOLN
INTRAVENOUS | Status: AC
  Filled 2018-05-16: qty 100

## 2018-05-16 MED ORDER — OXYCODONE HCL 5 MG/5ML PO SOLN: 5.0000 mg | Freq: Once | ORAL | Status: DC | PRN

## 2018-05-16 MED ORDER — LACTATED RINGERS IV SOLN
INTRAVENOUS | Status: DC
  Administered 2018-05-16: 07:00:00 via INTRAVENOUS

## 2018-05-16 MED ORDER — FENTANYL CITRATE (PF) 100 MCG/2ML IJ SOLN
INTRAMUSCULAR | Status: AC
  Filled 2018-05-16: qty 2

## 2018-05-16 MED ORDER — MEPERIDINE HCL 25 MG/ML IJ SOLN: 6.2500 mg | INTRAMUSCULAR | Status: DC | PRN

## 2018-05-16 MED ORDER — DEXTROSE 5 % IV SOLN
3.0000 g | INTRAVENOUS | Status: AC
  Administered 2018-05-16: 3 g via INTRAVENOUS

## 2018-05-16 MED ORDER — BACITRACIN-NEOMYCIN-POLYMYXIN 400-5-5000 EX OINT
TOPICAL_OINTMENT | CUTANEOUS | Status: AC
  Filled 2018-05-16: qty 1

## 2018-05-16 MED ORDER — SCOPOLAMINE 1 MG/3DAYS TD PT72: 1.0000 | MEDICATED_PATCH | Freq: Once | TRANSDERMAL | Status: DC | PRN

## 2018-05-16 MED ORDER — ONDANSETRON HCL 4 MG/2ML IJ SOLN
4.0000 mg | Freq: Once | INTRAMUSCULAR | Status: AC | PRN
  Administered 2018-05-16: 4 mg via INTRAVENOUS

## 2018-05-16 MED ORDER — ROCURONIUM BROMIDE 100 MG/10ML IV SOLN
INTRAVENOUS | Status: DC | PRN
  Administered 2018-05-16: 30 mg via INTRAVENOUS

## 2018-05-16 MED ORDER — DEXAMETHASONE SODIUM PHOSPHATE 4 MG/ML IJ SOLN
INTRAMUSCULAR | Status: DC | PRN
  Administered 2018-05-16: 10 mg via INTRAVENOUS

## 2018-05-16 MED ORDER — SUGAMMADEX SODIUM 500 MG/5ML IV SOLN
INTRAVENOUS | Status: DC | PRN
  Administered 2018-05-16: 500 mg via INTRAVENOUS

## 2018-05-16 MED ORDER — ACETAMINOPHEN 160 MG/5ML PO SOLN: 325.0000 mg | ORAL | Status: DC | PRN

## 2018-05-16 MED ORDER — OXYCODONE-ACETAMINOPHEN 5-325 MG PO TABS: 1.0000 | ORAL_TABLET | ORAL | 0 refills | Status: DC | PRN

## 2018-05-16 MED ORDER — SUGAMMADEX SODIUM 500 MG/5ML IV SOLN
INTRAVENOUS | Status: AC
  Filled 2018-05-16: qty 5

## 2018-05-16 MED ORDER — SODIUM CHLORIDE 0.9 % IV SOLN
INTRAVENOUS | Status: AC | PRN
  Administered 2018-05-16: 500 mL via INTRAMUSCULAR

## 2018-05-16 MED ORDER — ROCURONIUM BROMIDE 50 MG/5ML IV SOSY
PREFILLED_SYRINGE | INTRAVENOUS | Status: AC
  Filled 2018-05-16: qty 5

## 2018-05-16 MED ORDER — OXYCODONE HCL 5 MG PO TABS: 5.0000 mg | ORAL_TABLET | Freq: Once | ORAL | Status: DC | PRN

## 2018-05-16 MED ORDER — BUPIVACAINE-EPINEPHRINE 0.25% -1:200000 IJ SOLN
INTRAMUSCULAR | Status: AC
  Filled 2018-05-16: qty 1

## 2018-05-16 MED ORDER — FENTANYL CITRATE (PF) 100 MCG/2ML IJ SOLN
50.0000 ug | INTRAMUSCULAR | Status: DC | PRN
  Administered 2018-05-16: 100 ug via INTRAVENOUS

## 2018-05-16 MED ORDER — GLYCOPYRROLATE PF 0.2 MG/ML IJ SOSY
PREFILLED_SYRINGE | INTRAMUSCULAR | Status: AC
  Filled 2018-05-16: qty 1

## 2018-05-16 MED ORDER — FENTANYL CITRATE (PF) 100 MCG/2ML IJ SOLN: 25.0000 ug | INTRAMUSCULAR | Status: DC | PRN

## 2018-05-16 MED ORDER — PROPOFOL 10 MG/ML IV BOLUS
INTRAVENOUS | Status: AC
  Filled 2018-05-16: qty 40

## 2018-05-16 MED ORDER — ACETAMINOPHEN 325 MG PO TABS: 325.0000 mg | ORAL_TABLET | ORAL | Status: DC | PRN

## 2018-05-16 MED ORDER — LIDOCAINE-EPINEPHRINE 2 %-1:100000 IJ SOLN
INTRAMUSCULAR | Status: AC
  Filled 2018-05-16: qty 1

## 2018-05-16 MED ORDER — PROPOFOL 10 MG/ML IV BOLUS
INTRAVENOUS | Status: DC | PRN
  Administered 2018-05-16: 200 mg via INTRAVENOUS
  Administered 2018-05-16: 100 mg via INTRAVENOUS

## 2018-05-16 MED ORDER — AMOXICILLIN 500 MG PO CAPS: 500.0000 mg | ORAL_CAPSULE | Freq: Three times a day (TID) | ORAL | 0 refills | Status: DC

## 2018-05-16 MED ORDER — LIDOCAINE HCL (CARDIAC) PF 100 MG/5ML IV SOSY
PREFILLED_SYRINGE | INTRAVENOUS | Status: DC | PRN
  Administered 2018-05-16: 80 mg via INTRAVENOUS

## 2018-05-16 MED ORDER — LIDOCAINE 2% (20 MG/ML) 5 ML SYRINGE
INTRAMUSCULAR | Status: AC
  Filled 2018-05-16: qty 5

## 2018-05-16 MED ORDER — LIDOCAINE-EPINEPHRINE 2 %-1:100000 IJ SOLN
INTRAMUSCULAR | Status: DC | PRN
  Administered 2018-05-16: 16 mL via INTRADERMAL

## 2018-05-16 MED ORDER — MIDAZOLAM HCL 2 MG/2ML IJ SOLN
INTRAMUSCULAR | Status: AC
  Filled 2018-05-16: qty 2

## 2018-05-16 SURGICAL SUPPLY — 44 items
BANDAGE COBAN STERILE 2 (GAUZE/BANDAGES/DRESSINGS) IMPLANT
BLADE SURG 15 STRL LF DISP TIS (BLADE) ×1 IMPLANT
BLADE SURG 15 STRL SS (BLADE) ×2
BNDG EYE OVAL (GAUZE/BANDAGES/DRESSINGS) ×4 IMPLANT
BUR CROSS CUT FISSURE 1.6 (BURR) ×1 IMPLANT
BUR CROSS CUT FISSURE 1.6MM (BURR) ×1
BUR EGG ELITE 4.0 (BURR) ×1 IMPLANT
BUR EGG ELITE 4.0MM (BURR) ×1
CANISTER SUCT 1200ML W/VALVE (MISCELLANEOUS) ×3 IMPLANT
CATH ROBINSON RED A/P 10FR (CATHETERS) IMPLANT
COVER BACK TABLE 60X90IN (DRAPES) ×3 IMPLANT
COVER MAYO STAND STRL (DRAPES) ×3 IMPLANT
COVER WAND RF STERILE (DRAPES) IMPLANT
DECANTER SPIKE VIAL GLASS SM (MISCELLANEOUS) IMPLANT
DRAPE U-SHAPE 76X120 STRL (DRAPES) ×3 IMPLANT
GAUZE PACKING FOLDED 2  STR (GAUZE/BANDAGES/DRESSINGS) ×2
GAUZE PACKING FOLDED 2 STR (GAUZE/BANDAGES/DRESSINGS) ×1 IMPLANT
GAUZE PACKING IODOFORM 1/4X15 (GAUZE/BANDAGES/DRESSINGS) IMPLANT
GLOVE BIO SURGEON STRL SZ 6.5 (GLOVE) ×2 IMPLANT
GLOVE BIO SURGEON STRL SZ7.5 (GLOVE) ×3 IMPLANT
GLOVE BIO SURGEONS STRL SZ 6.5 (GLOVE) ×1
GLOVE BIOGEL PI IND STRL 6.5 (GLOVE) ×1 IMPLANT
GLOVE BIOGEL PI INDICATOR 6.5 (GLOVE) ×2
GOWN STRL REUS W/ TWL LRG LVL3 (GOWN DISPOSABLE) ×1 IMPLANT
GOWN STRL REUS W/ TWL XL LVL3 (GOWN DISPOSABLE) ×2 IMPLANT
GOWN STRL REUS W/TWL LRG LVL3 (GOWN DISPOSABLE) ×2
GOWN STRL REUS W/TWL XL LVL3 (GOWN DISPOSABLE) ×4
IV NS 500ML (IV SOLUTION) ×2
IV NS 500ML BAXH (IV SOLUTION) ×1 IMPLANT
NEEDLE HYPO 22GX1.5 SAFETY (NEEDLE) ×5 IMPLANT
NS IRRIG 1000ML POUR BTL (IV SOLUTION) ×3 IMPLANT
PACK BASIN DAY SURGERY FS (CUSTOM PROCEDURE TRAY) ×3 IMPLANT
SLEEVE SCD COMPRESS KNEE MED (MISCELLANEOUS) ×2 IMPLANT
SPONGE SURGIFOAM ABS GEL 12-7 (HEMOSTASIS) IMPLANT
SUT CHROMIC 3 0 PS 2 (SUTURE) ×5 IMPLANT
SYR BULB 3OZ (MISCELLANEOUS) ×3 IMPLANT
SYR CONTROL 10ML LL (SYRINGE) ×5 IMPLANT
TOOTHBRUSH ADULT (PERSONAL CARE ITEMS) IMPLANT
TOWEL GREEN STERILE FF (TOWEL DISPOSABLE) ×3 IMPLANT
TRAY DSU PREP LF (CUSTOM PROCEDURE TRAY) IMPLANT
TUBE CONNECTING 20'X1/4 (TUBING) ×1
TUBE CONNECTING 20X1/4 (TUBING) ×2 IMPLANT
TUBING IRRIGATION (MISCELLANEOUS) ×3 IMPLANT
YANKAUER SUCT BULB TIP NO VENT (SUCTIONS) ×3 IMPLANT

## 2018-05-16 NOTE — Op Note (Signed)
NAME: [name], COON MEDICAL RECORD ZO:[id number] ACCOUNT [id number] DATE OF BIRTH:1960/12/14 FACILITY: [facility] LOCATION: [facility] PHYSICIAN:Soyla Bainter M. Preciosa Bundrick, DDS  OPERATIVE REPORT  DATE OF PROCEDURE:  05/16/2018  PREOPERATIVE DIAGNOSIS:  Nonrestorable teeth secondary to dental caries #2, 10, 11, 12, 15.  Maxillary alveolar ridge contour deformity, right.  POSTOPERATIVE DIAGNOSIS:  Nonrestorable teeth secondary to dental caries #2, 10, 11, 12, 15, maxillary alveolar ridge contour deformity, right; hyperplastic left maxillary osseous tuberosity.  PROCEDURE:  Extraction of teeth #2, 10, 11, 12, 15, alveoplasty right and left maxilla, reduction maxillary right osseous and fibrous tuberosity.  SURGEON:  Diona Browner, DDS  ANESTHESIA:  General nasal intubation.  DESCRIPTION OF PROCEDURE:  The patient was taken to the operating room and placed on the table in supine position.  General anesthesia was administered intravenously, and a nasal endotracheal tube was placed and secured.  The patient was prepped for  surgery and draped and then a timeout was performed.  The posterior pharynx was suctioned and a throat pack was placed.  Lidocaine 2% at 1:100,000 epinephrine was infiltrated in the maxilla buccally and palatally on the right and left side.  A total of  16 mL was utilized.  A bite block was placed on the right side of the mouth.  A 15 blade was used to make an incision around tooth #15 and carried forward along the alveolar crest to tooth #12 and then divided buccally and palatally in the gingival  sulcus around teeth numbers 12, 11, and 10.  The incision was carried onward towards the midline, and the vertical release was made at the maxillary frenum.  The frenum was then dissected upwards, and then the periosteum was reflected from around the  teeth.  The teeth were elevated with a 301 elevator.  Teeth #15, 12, and 10 were removed with the dental forceps.  Tooth #11 required removal of  circumferential bone, and then this tooth was removed with the universal forceps.  The sockets were then  curetted.  The periosteum was reflected and alveoplasty was performed to obtain a smooth contour of bone with the egg-shaped bur and then with a bone file.  Then, the area was irrigated, closed with 3-0 chromic.  Some areas of gingival hyperplasia along  the buccal surface of the alveolar crest mucosa were trimmed using a 15 blade for further contour direction.  Then, the bite block and sweetheart retractor were repositioned to the other side of the mouth.  A 15 blade was used to make an incision around  tooth #2, carried forward along the alveolar crest to the area of tooth #7.  The periosteum was reflected.  Tooth #2 was elevated, and using a forceps, the tooth was removed; however, root tips remained, and so additional bone was removed around the root  tips using the [employer] handpiece with a fissure bur under irrigation.  The root tips were removed, and then it was noted that the right maxillary torus was approximately twice the size of the left torus and would interfere with denture fit after  healing.  Decision was then made to go ahead and recontour the tuberosity.  The incision was then created approximately to tooth #2, and the periosteum was reflected.  The 15 was used to reduce the fibrous tuberosity by making a wedge incision beneath  the outer layer of gingiva and carried through the thickest portion of the fibrous tuberosity.  This was removed with the rongeur, both buccally and palatally.  Then, the periosteum was  reflected to expose the lateral margin of the tuberosity.  Then, the  egg-shaped bur was then used to reduce the tuberosity as well as perform an alveoplasty of the right maxilla.  Then, the bone file was used to further smooth the area.  Then, the areas were irrigated and closed with 3-0 chromic.  The oral cavity was  irrigated and suctioned.  The throat pack was removed.  The  patient was left in the care of anesthesia for extubation and transported to recovery room with plans for discharge home through day surgery.  ESTIMATED BLOOD LOSS:  Minimal.  COMPLICATIONS:  None.  SPECIMENS:  None.  LN/NUANCE  D:05/16/2018 T:05/16/2018 JOB:[id number]

## 2018-05-16 NOTE — H&P (Signed)
H&P documentation  -History and Physical Reviewed  -Patient has been re-examined  -No change in the plan of care  [name]  

## 2018-05-16 NOTE — Anesthesia Postprocedure Evaluation (Signed)
Anesthesia Post Note  Patient: Damareon Devonne Doughty  Procedure(s) Performed: DENTAL RESTORATION/EXTRACTIONS WITH ALVEO (N/A Mouth)     Patient location during evaluation: PACU Anesthesia Type: General Level of consciousness: awake and alert Pain management: pain level controlled Vital Signs Assessment: post-procedure vital signs reviewed and stable Respiratory status: spontaneous breathing, nonlabored ventilation, respiratory function stable and patient connected to nasal cannula oxygen Cardiovascular status: blood pressure returned to baseline and stable Postop Assessment: no apparent nausea or vomiting Anesthetic complications: no    Last Vitals:  Vitals:   05/16/18 0919 05/16/18 0944  BP:  (!) 192/85  Pulse: 73 78  Resp: 17 18  Temp:  37.1 C  SpO2: 97% 97%    Last Pain:  Vitals:   05/16/18 0906  TempSrc:   PainSc: 5                  Dillan Lunden

## 2018-05-16 NOTE — Transfer of Care (Signed)
Immediate Anesthesia Transfer of Care Note  Patient: [name]  Procedure(s) Performed: DENTAL RESTORATION/EXTRACTIONS WITH ALVEO (N/A Mouth)  Patient Location: PACU  Anesthesia Type:General  Level of Consciousness: awake, alert  and oriented  Airway & Oxygen Therapy: Patient Spontanous Breathing and Patient connected to face mask oxygen  Post-op Assessment: Report given to RN and Post -op Vital signs reviewed and stable  Post vital signs: Reviewed and stable  Last Vitals:  Vitals Value Taken Time  BP 175/80 05/16/2018  8:33 AM  Temp    Pulse 76 05/16/2018  8:35 AM  Resp 7 05/16/2018  8:35 AM  SpO2 100 % 05/16/2018  8:35 AM  Vitals shown include unvalidated device data.  Last Pain:  Vitals:   05/16/18 0708  TempSrc: Oral  PainSc: 0-No pain      Patients Stated Pain Goal: 3 (47/09/29 5747)  Complications: No apparent anesthesia complications

## 2018-05-16 NOTE — Progress Notes (Signed)
Spoke with Dr Eligha Bridegroom. Pt having ST elevation present now. Not new, EKG shows ST elevation on previous EKG. Pt with hx of cocaine abuse, with last use in January 2019 per pt. Please see chart hx. Pt with hx of stemi/stent  Dr Eligha Bridegroom request pt to follow up with cardiologist and in future drug screen prior to surgery.

## 2018-05-16 NOTE — Op Note (Signed)
05/16/2018  8:26 AM  PATIENT:  [name]  57 y.o. male  PRE-OPERATIVE DIAGNOSIS:  NON-RESTORABLE TEETH # 2, 10, 11, 12, 15, MAXILLARY CONTOUR DEFORMITY RIGHT  POST-OPERATIVE DIAGNOSIS:  SAME+ HYPERPLASTIC RIGHT MAXILLARY TUBEROSITY  PROCEDURE:  Procedure(s): EXTRACTION TEETH 2, 10, 11, 12, 15, ALVEOLOPLASTY RIGHT AND LEFT MAXILLA, REDUCTION RIGHT MAXILLARY OSSEOUS AND FIBROUS TUBEROSITY  SURGEON:  Surgeon(s): Diona Browner, DDS  ANESTHESIA:   local and general  EBL:  minimal  DRAINS: none   SPECIMEN:  No Specimen  COUNTS:  YES  PLAN OF CARE: Discharge to home after PACU  PATIENT DISPOSITION:  PACU - hemodynamically stable.   PROCEDURE DETAILS: Dictation # [id number]  Gae Bon, DMD 05/16/2018 8:26 AM

## 2018-05-16 NOTE — Anesthesia Procedure Notes (Signed)
Procedure Name: Intubation Performed by: Verita Lamb, CRNA Pre-anesthesia Checklist: Patient identified, Emergency Drugs available, Suction available, Patient being monitored and Timeout performed Patient Re-evaluated:Patient Re-evaluated prior to induction Oxygen Delivery Method: Circle system utilized Preoxygenation: Pre-oxygenation with 100% oxygen Induction Type: IV induction Ventilation: Oral airway inserted - appropriate to patient size and Mask ventilation without difficulty Laryngoscope Size: Mac and 4 Grade View: Grade I Nasal Tubes: Right and Nasal Rae Tube size: 7.5 mm Number of attempts: 1 Placement Confirmation: ETT inserted through vocal cords under direct vision,  positive ETCO2,  breath sounds checked- equal and bilateral and CO2 detector Secured at: 26 cm Tube secured with: Tape Dental Injury: Teeth and Oropharynx as per pre-operative assessment  Comments: Smooth iv induction after preoxygenation, easy mask vent with oral airway.  Afrin used in both nares prior to intubation.  Atraumatically inserted 7.5 nasal rae ett through right nare.  Inserted ett through glottic opening with grade I view. Bbs, +etco2, after induction bp high and nose bleed noted.  Pressure held, anesthetic deepened with extra prop and gas. Afrin in both nares.  Once bp down, bleeding ceased. mkelly

## 2018-05-16 NOTE — Discharge Instructions (Signed)

## 2018-05-17 ENCOUNTER — Encounter (HOSPITAL_BASED_OUTPATIENT_CLINIC_OR_DEPARTMENT_OTHER): Payer: Self-pay | Admitting: Oral Surgery

## 2018-05-20 NOTE — Addendum Note (Signed)
Addendum  created 05/20/18 0842 by Tawni Millers, CRNA   Charge Capture section accepted

## 2018-05-30 DIAGNOSIS — E113293 Type 2 diabetes mellitus with mild nonproliferative diabetic retinopathy without macular edema, bilateral: Secondary | ICD-10-CM | POA: Diagnosis not present

## 2018-05-30 DIAGNOSIS — H2513 Age-related nuclear cataract, bilateral: Secondary | ICD-10-CM | POA: Diagnosis not present

## 2018-06-01 ENCOUNTER — Other Ambulatory Visit: Payer: Self-pay | Admitting: Internal Medicine

## 2018-06-01 DIAGNOSIS — E118 Type 2 diabetes mellitus with unspecified complications: Secondary | ICD-10-CM

## 2018-06-10 ENCOUNTER — Other Ambulatory Visit: Payer: Self-pay | Admitting: Internal Medicine

## 2018-06-10 DIAGNOSIS — I10 Essential (primary) hypertension: Secondary | ICD-10-CM

## 2018-06-12 ENCOUNTER — Encounter: Payer: Self-pay | Admitting: Podiatry

## 2018-06-12 ENCOUNTER — Ambulatory Visit (INDEPENDENT_AMBULATORY_CARE_PROVIDER_SITE_OTHER): Payer: Medicare Other | Admitting: Podiatry

## 2018-06-12 DIAGNOSIS — M79674 Pain in right toe(s): Secondary | ICD-10-CM

## 2018-06-12 DIAGNOSIS — M79675 Pain in left toe(s): Secondary | ICD-10-CM

## 2018-06-12 DIAGNOSIS — E1142 Type 2 diabetes mellitus with diabetic polyneuropathy: Secondary | ICD-10-CM

## 2018-06-12 DIAGNOSIS — B351 Tinea unguium: Secondary | ICD-10-CM | POA: Diagnosis not present

## 2018-06-12 DIAGNOSIS — L84 Corns and callosities: Secondary | ICD-10-CM | POA: Diagnosis not present

## 2018-06-12 DIAGNOSIS — L600 Ingrowing nail: Secondary | ICD-10-CM

## 2018-06-12 NOTE — Patient Instructions (Addendum)
   APPLY NEOSPORIN TO LEFT GREAT TOE ONCE DAILY FOR ONE WEEK. CONTACT OFFICE IF TOE SWELLS OR YOU NOTICE DRAINAGE OR ODOR FROM THE DIGIT.  Onychomycosis/Fungal Toenails  WHAT IS IT? An infection that lies within the keratin of your nail plate that is caused by a fungus.  WHY ME? Fungal infections affect all ages, sexes, races, and creeds.  There may be many factors that predispose you to a fungal infection such as age, coexisting medical conditions such as diabetes, or an autoimmune disease; stress, medications, fatigue, genetics, etc.  Bottom line: fungus thrives in a warm, moist environment and your shoes offer such a location.  IS IT CONTAGIOUS? Theoretically, yes.  You do not want to share shoes, nail clippers or files with someone who has fungal toenails.  Walking around barefoot in the same room or sleeping in the same bed is unlikely to transfer the organism.  It is important to realize, however, that fungus can spread easily from one nail to the next on the same foot.  HOW DO WE TREAT THIS?  There are several ways to treat this condition.  Treatment may depend on many factors such as age, medications, pregnancy, liver and kidney conditions, etc.  It is best to ask your doctor which options are available to you.  1. No treatment.   Unlike many other medical concerns, you can live with this condition.  However for many people this can be a painful condition and may lead to ingrown toenails or a bacterial infection.  It is recommended that you keep the nails cut short to help reduce the amount of fungal nail. 2. Topical treatment.  These range from herbal remedies to prescription strength nail lacquers.  About 40-50% effective, topicals require twice daily application for approximately 9 to 12 months or until an entirely new nail has grown out.  The most effective topicals are medical grade medications available through physicians offices. 3. Oral antifungal medications.  With an 80-90% cure  rate, the most common oral medication requires 3 to 4 months of therapy and stays in your system for a year as the new nail grows out.  Oral antifungal medications do require blood work to make sure it is a safe drug for you.  A liver function panel will be performed prior to starting the medication and after the first month of treatment.  It is important to have the blood work performed to avoid any harmful side effects.  In general, this medication safe but blood work is required. 4. Laser Therapy.  This treatment is performed by applying a specialized laser to the affected nail plate.  This therapy is noninvasive, fast, and non-painful.  It is not covered by insurance and is therefore, out of pocket.  The results have been very good with a 80-95% cure rate.  [facility] is the only practice in the area to offer this therapy. 5. Permanent Nail Avulsion.  Removing the entire nail so that a new nail will not grow back.

## 2018-06-12 NOTE — Progress Notes (Signed)
Subjective: [name] presents with diabetes, diabetic neuropathy and cc of painful, discolored, thick toenails and painful callus plantar left hallux.  [name]' neuropathy is managed with gabapentin.   Objective: Vascular Examination: Capillary refill time <3 seconds x 10 digits Dorsalis pedis faintly palpable b/l Posterior tibial pulses faintly palpable b/l No digital hair x 10 digits Skin temperature gradient WNL b/l  Dermatological Examination: Skin with normal turgor, texture and tone b/l  Toenails 1-5 b/l discolored, thick, dystrophic with subungual debris and pain with palpation to nailbeds due to thickness of nails.  Left hallux noted with incurvated nail plate at medial border with localized superficial abscess. Post-incision and drainage revealed red, granular base. No cellulitis, no lymphangitis, no odor.  Hyperkeratotic lesion sub hallux IPJ left foot. No flocculence, no erythema, no edema, no drainage.    Musculoskeletal: Muscle strength 5/5 to all LE muscle groups  Neurological: Sensation diminished with 10 gram monofilament. Vibratory sensation diminished  Assessment: 1. Painful onychomycosis toenails 1-5 b/l 2. Callus/Corn  3. NIDDM with Diabetic neuropathy  Plan: 1. Continue diabetic foot care principles.  2. Toenails 1-5 b/l were debrided in length and girth without iatrogenic bleeding.  Offending nail border debrided and abscess drained. Border cleansed with alcohol. Triple antibiotic ointment and bandaid applied. Patient instructed to apply Neosporin Cream to left great toe once daily for a week. He is to call the office should he notice any redness, drainage or odor. 3. Hyperkeratotic lesion left hallux pared with sterile chisel blade 4. Patient to continue soft, supportive shoe gear 5. Patient to report any pedal injuries to medical professional  6. Follow up 3 months. Patient/POA to call should there be a concern in the interim.

## 2018-06-25 ENCOUNTER — Encounter: Payer: Self-pay | Admitting: Internal Medicine

## 2018-06-25 ENCOUNTER — Ambulatory Visit: Payer: Medicare Other | Attending: Internal Medicine | Admitting: Internal Medicine

## 2018-06-25 VITALS — BP 163/85 | HR 70 | Temp 98.3°F | Resp 16 | Wt 291.8 lb

## 2018-06-25 DIAGNOSIS — Z87891 Personal history of nicotine dependence: Secondary | ICD-10-CM | POA: Insufficient documentation

## 2018-06-25 DIAGNOSIS — Z6836 Body mass index (BMI) 36.0-36.9, adult: Secondary | ICD-10-CM | POA: Insufficient documentation

## 2018-06-25 DIAGNOSIS — E1142 Type 2 diabetes mellitus with diabetic polyneuropathy: Secondary | ICD-10-CM | POA: Insufficient documentation

## 2018-06-25 DIAGNOSIS — I2510 Atherosclerotic heart disease of native coronary artery without angina pectoris: Secondary | ICD-10-CM | POA: Insufficient documentation

## 2018-06-25 DIAGNOSIS — E1165 Type 2 diabetes mellitus with hyperglycemia: Secondary | ICD-10-CM | POA: Diagnosis not present

## 2018-06-25 DIAGNOSIS — I10 Essential (primary) hypertension: Secondary | ICD-10-CM

## 2018-06-25 DIAGNOSIS — I509 Heart failure, unspecified: Secondary | ICD-10-CM | POA: Diagnosis not present

## 2018-06-25 DIAGNOSIS — Z23 Encounter for immunization: Secondary | ICD-10-CM | POA: Diagnosis not present

## 2018-06-25 DIAGNOSIS — Z8673 Personal history of transient ischemic attack (TIA), and cerebral infarction without residual deficits: Secondary | ICD-10-CM | POA: Insufficient documentation

## 2018-06-25 DIAGNOSIS — I110 Hypertensive heart disease with heart failure: Secondary | ICD-10-CM | POA: Diagnosis not present

## 2018-06-25 DIAGNOSIS — Z888 Allergy status to other drugs, medicaments and biological substances status: Secondary | ICD-10-CM | POA: Insufficient documentation

## 2018-06-25 DIAGNOSIS — Z79899 Other long term (current) drug therapy: Secondary | ICD-10-CM | POA: Diagnosis not present

## 2018-06-25 DIAGNOSIS — E118 Type 2 diabetes mellitus with unspecified complications: Secondary | ICD-10-CM

## 2018-06-25 DIAGNOSIS — E785 Hyperlipidemia, unspecified: Secondary | ICD-10-CM | POA: Diagnosis not present

## 2018-06-25 DIAGNOSIS — Z7984 Long term (current) use of oral hypoglycemic drugs: Secondary | ICD-10-CM | POA: Insufficient documentation

## 2018-06-25 DIAGNOSIS — L309 Dermatitis, unspecified: Secondary | ICD-10-CM | POA: Insufficient documentation

## 2018-06-25 DIAGNOSIS — I252 Old myocardial infarction: Secondary | ICD-10-CM | POA: Diagnosis not present

## 2018-06-25 LAB — POCT GLYCOSYLATED HEMOGLOBIN (HGB A1C): HbA1c, POC (controlled diabetic range): 7 % (ref 0.0–7.0)

## 2018-06-25 LAB — GLUCOSE, POCT (MANUAL RESULT ENTRY): POC GLUCOSE: 127 mg/dL — AB (ref 70–99)

## 2018-06-25 MED ORDER — LABETALOL HCL 100 MG PO TABS: 100.0000 mg | ORAL_TABLET | Freq: Two times a day (BID) | ORAL | 6 refills | Status: DC

## 2018-06-25 MED ORDER — TRIAMCINOLONE ACETONIDE 0.1 % EX CREA: 1.0000 "application " | TOPICAL_CREAM | Freq: Two times a day (BID) | CUTANEOUS | 1 refills | Status: DC

## 2018-06-25 NOTE — Progress Notes (Signed)
Patient ID: [name], male    DOB: September 26, 1960  MRN: [id number]  CC: Diabetes and Hypertension   Subjective: [name] is a 57 y.o. male who presents for chronic ds management His concerns today include:  57year old male with history ofDM2, HTN, dCHF, former tob dep, HL, CVA (about 3 yrs ago per pt), spinal stenosis of lumbar spine,chronic lower extremity edema  DM: compliant with oral meds Checks BS daily in a.m.  Gives range of 111-119.  Highest was 250. "sometimes I eat too much." Admits that he snacks on cookies and potatoe chips during the day Not getting in much exercise due to weather.  "I do walk inside my house."  Plans to join the gym  HTN/CHF:  Reports compliance with meds and salt restriction No CP/SOB/LE edema  tob dep:  Still tobacco free and free of street drugs  Patient Active Problem List   Diagnosis Date Noted  . TIA (transient ischemic attack) 03/05/2018  . Benign essential HTN 03/05/2018  . Sensorineural hearing loss of both ears 12/04/2017  . Mild nonproliferative diabetic retinopathy ([place]) 01/10/2016  . Hypertensive retinopathy of both eyes 01/10/2016  . Spinal stenosis of lumbar region 07/15/2015  . Abnormality of gait 06/07/2015  . Hydrocephalus ([place]) 06/07/2015  . Peripheral neuropathy 04/26/2015  . Constipation 04/26/2015  . Diabetes mellitus type 2 with complications ([place]) 47/65/4650  . Decreased hearing of both ears 03/15/2015  . Tinea pedis 03/15/2015  . Callus of foot 05/22/2014  . 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 Coronary vasospasm, cocaine related 12/29/2013  . Cocaine abuse ([place]) 12/28/2013  . Alcoholism /alcohol abuse ([place]) 12/28/2013  . Left-sided weakness 12/26/2013  . Hyperlipidemia associated with type 2 diabetes mellitus ([place]) 04/01/2007  . OBESITY NOS 03/28/2007  . Essential hypertension 03/28/2007     Current Outpatient Medications on File Prior to Visit  Medication Sig Dispense Refill  .  atorvastatin (LIPITOR) 80 MG tablet Take 1 tablet (80 mg total) by mouth daily. 30 tablet 0  . Blood Glucose Monitoring Suppl (TRUE METRIX METER) w/Device KIT Use as directed 1 kit 0  . cetirizine (ZYRTEC) 10 MG tablet Take 1 tablet (10 mg total) by mouth daily. 30 tablet 11  . furosemide (LASIX) 40 MG tablet TAKE ONE TABLET BY MOUTH EVERY MORNING 30 tablet 0  . gabapentin (NEURONTIN) 300 MG capsule TAKE 1 CAPSULE (300 MG TOTAL) BY MOUTH THREE TIMES DAILY. 270 capsule 3  . glipiZIDE (GLUCOTROL XL) 10 MG 24 hr tablet TAKE ONE TABLET BY MOUTH DAILY WITH BREAKFAST 30 tablet 2  . Glucose Blood (TRUE METRIX BLOOD GLUCOSE TEST VI) USE AS DIRECTED  0  . glucose blood (TRUE METRIX BLOOD GLUCOSE TEST) test strip Check blood sugars 1-2 times daily 100 each 12  . glucose blood test strip CHECK BLOOD SUGARS 1-2 TIMES DAILY  12  . Lancets Misc. (ACCU-CHEK SOFTCLIX LANCET DEV) KIT Use as directed 1 kit 0  . lisinopril (PRINIVIL,ZESTRIL) 40 MG tablet Take 1 tablet by mouth daily.    . metFORMIN (GLUCOPHAGE) 1000 MG tablet TAKE ONE TABLET BY MOUTH TWICE A DAY WITH MEALS 60 tablet 2  . TRUEPLUS LANCETS 28G MISC Use as directed 100 each 6   No current facility-administered medications on file prior to visit.     Allergies  Allergen Reactions  . Amlodipine Swelling    Lower extremity swelling    Social History   Socioeconomic History  . Marital status: Divorced  Spouse name: Not on file  . Number of children: 0  . Years of education: 10  . Highest education level: Not on file  Occupational History  . Occupation: Unemployed  Social Needs  . Financial resource strain: Not on file  . Food insecurity:    Worry: Not on file    Inability: Not on file  . Transportation needs:    Medical: Not on file    Non-medical: Not on file  Tobacco Use  . Smoking status: Former Smoker    Years: 45.00    Types: Cigarettes    Last attempt to quit: 03/03/2018    Years since quitting: 0.3  . Smokeless tobacco:  Never Used  . Tobacco comment: 11-15-2017  per pt 1pp3days  Substance and Sexual Activity  . Alcohol use: Not Currently    Alcohol/week: 0.0 standard drinks    Comment: 11-15-2017  per pt last alcohol 12/ 2018  alcohol abuse  . Drug use: Not Currently    Types: "Crack" cocaine, Cocaine    Comment: 11-15-2017  per pt last used March 2019  . Sexual activity: Not on file  Lifestyle  . Physical activity:    Days per week: Not on file    Minutes per session: Not on file  . Stress: Not on file  Relationships  . Social connections:    Talks on phone: Not on file    Gets together: Not on file    Attends religious service: Not on file    Active member of club or organization: Not on file    Attends meetings of clubs or organizations: Not on file    Relationship status: Not on file  . Intimate partner violence:    Fear of current or ex partner: Not on file    Emotionally abused: Not on file    Physically abused: Not on file    Forced sexual activity: Not on file  Other Topics Concern  . Not on file  Social History Narrative   Lives at home alone.   Right-handed.   Occasional use.    Family History  Problem Relation Age of Onset  . Hypertension Mother   . Diabetes Mother   . Cancer Mother        breast cancer   . Hypertension Father   . Heart disease Father   . Stroke Father   . Hypertension Sister     Past Surgical History:  Procedure Laterality Date  . CARDIAC CATHETERIZATION  12/01/2008   dr hochrein   abnormal stress myoview:  mild coronary plaque, normal LVF  . CARDIAC CATHETERIZATION  04/29/2011   dr [place]   in setting ECG with new ST elevation & severe hypertensive:  nonobstructive atherosclerotic CAD, normal LVF (30% mRCA)   . CARDIOVASCULAR STRESS TEST  08/15/2010   Low nuclear study w/ no evidence ishemia/  ef 42% with lateral and apical hypokinesis  . CYSTOSCOPY WITH BIOPSY N/A 11/20/2017   Procedure: CYSTOSCOPY WITH BIOPSY AND FULGURATION;  Surgeon: [name], MD;  [facility];  Service: Urology;  Laterality: N/A;  . INCISION AND DRAINAGE RIGHT DISTAL MEDIAL THIGH HEMATOMA  04-14-2005   dr Ninfa Linden  . left arm skin graft  1976   injury  . LEFT HEART CATHETERIZATION WITH CORONARY ANGIOGRAM Bilateral 12/26/2013   Procedure: LEFT HEART CATHETERIZATION WITH CORONARY ANGIOGRAM;  Surgeon: Burnell Blanks, MD;  Location: [facility] CATH LAB;  Service: Cardiovascular;  Laterality: Bilateral;  STEMI, in setting cocaine/ alcohol :  mid disease in the RCA (20%), moderate disease in the small caliber intermediate branch (distal 50-60%), normal LVSF (ef 55-60%)  . TOOTH EXTRACTION N/A 05/16/2018   Procedure: DENTAL RESTORATION/EXTRACTIONS WITH ALVEO;  Surgeon: Diona Browner, DDS;  Location: [place];  Service: Oral Surgery;  Laterality: N/A;  . TRANSTHORACIC ECHOCARDIOGRAM  08/05/2017   moderate LVH,  ef 55%,  grade 1 diastolic dysfunction/  mild LAE/  trivial Tr    ROS: Review of Systems  Skin:       Still has itchy dry rash on the left upper arm that has now spread to the right upper arm.  Reports some relief when he uses triamcinolone cream.  Requests refill.    PHYSICAL EXAM: BP (!) 163/85   Pulse 70   Temp 98.3 F (36.8 C) (Oral)   Resp 16   Wt 291 lb 12.8 oz (132.4 kg)   SpO2 98%   BMI 36.47 kg/m   Wt Readings from Last 3 Encounters:  06/25/18 291 lb 12.8 oz (132.4 kg)  05/16/18 294 lb 8.6 oz (133.6 kg)  04/26/18 294 lb 3.2 oz (133.4 kg)   Physical Exam General appearance - alert, well appearing, and in no distress Mental status - normal mood, behavior, speech, dress, motor activity, and thought processes Eyes - pupils equal and reactive, extraocular eye movements intact Mouth - mucous membranes moist, pharynx normal without lesions Neck - supple, no significant adenopathy Chest - clear to auscultation, no wheezes, rales or rhonchi, symmetric air entry Heart - normal rate, regular rhythm, normal  S1, S2, no murmurs, rubs, clicks or gallops Extremities -trace lower extremity edema Skin: Hyperpigmented excoriation on the lateral upper arms Diabetic Foot Exam - Simple   Simple Foot Form Visual Inspection See comments:  Yes Sensation Testing See comments:  Yes Pulse Check Posterior Tibialis and Dorsalis pulse intact bilaterally:  Yes Comments Skin on feet is dry.  Toenails are thick and discolored.  Shaved callus on this plantar surface of the left big toe and ball of the left fifth toe.     Results for orders placed or performed in visit on 06/25/18  POCT glucose (manual entry)  Result Value Ref Range   POC Glucose 127 (A) 70 - 99 mg/dl  POCT glycosylated hemoglobin (Hb A1C)  Result Value Ref Range   Hemoglobin A1C     HbA1c POC (<> result, manual entry)     HbA1c, POC (prediabetic range)     HbA1c, POC (controlled diabetic range) 7.0 0.0 - 7.0 %   ASSESSMENT AND PLAN: 1. Diabetes mellitus type 2 with complications (HCC) E7N close to goal.  We would like to get him less than 7.  Advised to continue current dose of glipizide and metformin. Dietary counseling given.  Advised to eat healthier snacks like fruits or peanuts Patient planning to join the gym.  He will try to get up 2-3 times a week.  I told him to start walking for at least 15 minutes and then gradually increase to 30 minutes over time. - POCT glucose (manual entry) - POCT glycosylated hemoglobin (Hb A1C)  2. Essential hypertension Not at goal.  Increase labetalol to a full tablet twice a day.  Continue to limit salt in the foods. - labetalol (NORMODYNE) 100 MG tablet; Take 1 tablet (100 mg total) by mouth 2 (two) times daily.  Dispense: 30 tablet; Refill: 6  3. Class 2 severe obesity due to excess calories with serious comorbidity and body mass index (BMI)  of 36.0 to 36.9 in adult [facility]) See #1 above  4. Dermatitis - triamcinolone cream (KENALOG) 0.1 %; Apply 1 application topically 2 (two) times daily.   Dispense: 45 g; Refill: 1 - Ambulatory referral to Dermatology  5. Need for immunization against influenza - Flu Vaccine QUAD 36+ mos IM    Patient was given the opportunity to ask questions.  Patient verbalized understanding of the plan and was able to repeat key elements of the plan.   Orders Placed This Encounter  Procedures  . Flu Vaccine QUAD 36+ mos IM  . Ambulatory referral to Dermatology  . POCT glucose (manual entry)  . POCT glycosylated hemoglobin (Hb A1C)     Requested Prescriptions   Signed Prescriptions Disp Refills  . triamcinolone cream (KENALOG) 0.1 % 45 g 1    Sig: Apply 1 application topically 2 (two) times daily.  Marland Kitchen labetalol (NORMODYNE) 100 MG tablet 30 tablet 6    Sig: Take 1 tablet (100 mg total) by mouth 2 (two) times daily.    Return in about 3 months (around 09/24/2018).  Karle Plumber, MD, FACP

## 2018-06-25 NOTE — Patient Instructions (Addendum)
Your blood pressure is not at goal of 130/80 or lower.  I recommend increasing the labetalol to 100 mg twice a day.  A new prescription has been sent to your pharmacy.  Try eating healthier snacks like fruits or peanuts. When you start the gym I would try to exercise for about 15 minutes.  Start low and go slow.  Influenza Virus Vaccine injection (Fluarix) What is this medicine? INFLUENZA VIRUS VACCINE (in floo EN zuh VAHY ruhs vak SEEN) helps to reduce the risk of getting influenza also known as the flu. This medicine may be used for other purposes; ask your health care provider or pharmacist if you have questions. COMMON BRAND NAME(S): Fluarix, Fluzone What should I tell my health care provider before I take this medicine? They need to know if you have any of these conditions: -bleeding disorder like hemophilia -fever or infection -Guillain-Barre syndrome or other neurological problems -immune system problems -infection with the human immunodeficiency virus (HIV) or AIDS -low blood platelet counts -multiple sclerosis -an unusual or allergic reaction to influenza virus vaccine, eggs, chicken proteins, latex, gentamicin, other medicines, foods, dyes or preservatives -pregnant or trying to get pregnant -breast-feeding How should I use this medicine? This vaccine is for injection into a muscle. It is given by a health care professional. A copy of Vaccine Information Statements will be given before each vaccination. Read this sheet carefully each time. The sheet may change frequently. Talk to your pediatrician regarding the use of this medicine in children. Special care may be needed. Overdosage: If you think you have taken too much of this medicine contact a poison control center or emergency room at once. NOTE: This medicine is only for you. Do not share this medicine with others. What if I miss a dose? This does not apply. What may interact with this medicine? -chemotherapy or  radiation therapy -medicines that lower your immune system like etanercept, anakinra, infliximab, and adalimumab -medicines that treat or prevent blood clots like warfarin -phenytoin -steroid medicines like prednisone or cortisone -theophylline -vaccines This list may not describe all possible interactions. Give your health care provider a list of all the medicines, herbs, non-prescription drugs, or dietary supplements you use. Also tell them if you smoke, drink alcohol, or use illegal drugs. Some items may interact with your medicine. What should I watch for while using this medicine? Report any side effects that do not go away within 3 days to your doctor or health care professional. Call your health care provider if any unusual symptoms occur within 6 weeks of receiving this vaccine. You may still catch the flu, but the illness is not usually as bad. You cannot get the flu from the vaccine. The vaccine will not protect against colds or other illnesses that may cause fever. The vaccine is needed every year. What side effects may I notice from receiving this medicine? Side effects that you should report to your doctor or health care professional as soon as possible: -allergic reactions like skin rash, itching or hives, swelling of the face, lips, or tongue Side effects that usually do not require medical attention (report to your doctor or health care professional if they continue or are bothersome): -fever -headache -muscle aches and pains -pain, tenderness, redness, or swelling at site where injected -weak or tired This list may not describe all possible side effects. Call your doctor for medical advice about side effects. You may report side effects to FDA at 1-800-FDA-1088. Where should I keep my medicine?  This vaccine is only given in a clinic, pharmacy, doctor's office, or other health care setting and will not be stored at home. NOTE: This sheet is a summary. It may not cover all  possible information. If you have questions about this medicine, talk to your doctor, pharmacist, or health care provider.  2018 Elsevier/Gold Standard (2008-02-05 09:30:40)

## 2018-07-10 ENCOUNTER — Other Ambulatory Visit: Payer: Self-pay | Admitting: Internal Medicine

## 2018-08-13 DIAGNOSIS — I872 Venous insufficiency (chronic) (peripheral): Secondary | ICD-10-CM | POA: Diagnosis not present

## 2018-08-13 DIAGNOSIS — L810 Postinflammatory hyperpigmentation: Secondary | ICD-10-CM | POA: Diagnosis not present

## 2018-08-13 DIAGNOSIS — L309 Dermatitis, unspecified: Secondary | ICD-10-CM | POA: Diagnosis not present

## 2018-08-15 ENCOUNTER — Other Ambulatory Visit: Payer: Self-pay | Admitting: Internal Medicine

## 2018-08-15 DIAGNOSIS — L309 Dermatitis, unspecified: Secondary | ICD-10-CM

## 2018-09-10 ENCOUNTER — Other Ambulatory Visit: Payer: Self-pay

## 2018-09-10 ENCOUNTER — Ambulatory Visit (INDEPENDENT_AMBULATORY_CARE_PROVIDER_SITE_OTHER): Payer: Medicare Other | Admitting: Podiatry

## 2018-09-10 DIAGNOSIS — M79674 Pain in right toe(s): Secondary | ICD-10-CM | POA: Diagnosis not present

## 2018-09-10 DIAGNOSIS — L84 Corns and callosities: Secondary | ICD-10-CM | POA: Diagnosis not present

## 2018-09-10 DIAGNOSIS — E1142 Type 2 diabetes mellitus with diabetic polyneuropathy: Secondary | ICD-10-CM

## 2018-09-10 DIAGNOSIS — B351 Tinea unguium: Secondary | ICD-10-CM

## 2018-09-10 DIAGNOSIS — M79675 Pain in left toe(s): Secondary | ICD-10-CM | POA: Diagnosis not present

## 2018-09-10 NOTE — Patient Instructions (Signed)

## 2018-09-14 ENCOUNTER — Emergency Department (HOSPITAL_COMMUNITY)
Admission: EM | Admit: 2018-09-14 | Discharge: 2018-09-14 | Disposition: A | Discharge status: Home / Self Care | Payer: Medicare Other | Attending: Emergency Medicine | Admitting: Emergency Medicine

## 2018-09-14 ENCOUNTER — Encounter (HOSPITAL_COMMUNITY): Payer: Self-pay

## 2018-09-14 DIAGNOSIS — I5032 Chronic diastolic (congestive) heart failure: Secondary | ICD-10-CM | POA: Diagnosis not present

## 2018-09-14 DIAGNOSIS — Z87891 Personal history of nicotine dependence: Secondary | ICD-10-CM | POA: Insufficient documentation

## 2018-09-14 DIAGNOSIS — I130 Hypertensive heart and chronic kidney disease with heart failure and stage 1 through stage 4 chronic kidney disease, or unspecified chronic kidney disease: Secondary | ICD-10-CM | POA: Insufficient documentation

## 2018-09-14 DIAGNOSIS — N182 Chronic kidney disease, stage 2 (mild): Secondary | ICD-10-CM | POA: Diagnosis not present

## 2018-09-14 DIAGNOSIS — J0100 Acute maxillary sinusitis, unspecified: Secondary | ICD-10-CM | POA: Diagnosis not present

## 2018-09-14 DIAGNOSIS — Z79899 Other long term (current) drug therapy: Secondary | ICD-10-CM | POA: Diagnosis not present

## 2018-09-14 DIAGNOSIS — E119 Type 2 diabetes mellitus without complications: Secondary | ICD-10-CM | POA: Diagnosis not present

## 2018-09-14 DIAGNOSIS — R0981 Nasal congestion: Secondary | ICD-10-CM | POA: Diagnosis present

## 2018-09-14 MED ORDER — AMOXICILLIN-POT CLAVULANATE 875-125 MG PO TABS: 1.0000 | ORAL_TABLET | Freq: Two times a day (BID) | ORAL | 0 refills | Status: DC

## 2018-09-14 NOTE — ED Provider Notes (Signed)
[name] Provider Note   CSN: [id number] Arrival date & time: 09/14/18  1143    History   Chief Complaint Chief Complaint  Patient presents with  . Nasal Congestion    HPI [name] is a 58 y.o. male.     The history is provided by the patient and medical records. No language interpreter was used.    [name] is a 58 y.o. male  with multiple co-mordities as listed below who presents to the [name] complaining of progressively worsening nasal congestion x 1 month. Associated with headache and intermittently dry cough. No longer feels like he can breath through his nose due to congestion.  Tried Alka-Seltzer cold and flu for about 5 days and noticed no improvement with this.  Denies any fever or chills.  No sore throat.  Past Medical History:  Diagnosis Date  . Alcohol abuse    11-15-2017  per pt last alcohol Dec 2018  . BPH with obstruction/lower urinary tract symptoms   . Chronic arm pain   . Chronic pain    arms, leg, back  . CKD (chronic kidney disease), stage II   . Cocaine abuse ([place])    11-15-2017  per pt last used March 2019  . Congenital hydrocephalus, unspecified (HCC)    slow progression  . Diabetic retinopathy of both eyes ([place])   . Diastolic CHF, chronic ([place]) 07/2017  . Gait instability    multifactorial -- slow worsening congenital hydrocephalus, peripheral neuropathy, left C5-6 cord lesion  . Hematuria   . History of acute pulmonary edema 07/2017  . History of spinal cord injury 01/02/2014   pt fell, caused spinal cord contusion at C5-6--  residual left side weakness    . 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 History of TIA (transient ischemic attack) and stroke    hx cva 12-20-2010 and  TIA 02-22-2011--- no residual's from cva or tia  . Hyperlipidemia   . Hypertension    followed by pcp  . Left-sided weakness  12/2013   chronic due to spinal cord contusion  . Lesion of bladder   . Peripheral edema    chronic LUE  . Peripheral neuropathy   . Type 2 diabetes mellitus ([place])    followed by dr Karle Plumber--  last A1c 6.8 on 11-06-2017  . Weak urinary stream     Patient Active Problem List   Diagnosis Date Noted  . TIA (transient ischemic attack) 03/05/2018  . Benign essential HTN 03/05/2018  . Sensorineural hearing loss of both ears 12/04/2017  . Mild nonproliferative diabetic retinopathy ([place]) 01/10/2016  . Hypertensive retinopathy of both eyes 01/10/2016  . Spinal stenosis of lumbar region 07/15/2015  . Abnormality of gait 06/07/2015  . Hydrocephalus ([place]) 06/07/2015  . Peripheral neuropathy 04/26/2015  . Constipation 04/26/2015  . Diabetes mellitus type 2 with complications ([place]) 61/60/7371  . Decreased hearing of both ears 03/15/2015  . Tinea pedis 03/15/2015  . Callus of foot 05/22/2014  . 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 Coronary vasospasm, cocaine related 12/29/2013  . Cocaine abuse ([place]) 12/28/2013  . Alcoholism /alcohol abuse ([place]) 12/28/2013  . Left-sided weakness 12/26/2013  . Hyperlipidemia associated with type 2 diabetes mellitus ([place]) 04/01/2007  . OBESITY NOS 03/28/2007  . Essential hypertension 03/28/2007    Past Surgical History:  Procedure Laterality Date  . CARDIAC CATHETERIZATION  12/01/2008   dr hochrein  abnormal stress myoview:  mild coronary plaque, normal LVF  . CARDIAC CATHETERIZATION  04/29/2011   dr [place]   in setting ECG with new ST elevation & severe hypertensive:  nonobstructive atherosclerotic CAD, normal LVF (30% mRCA)   . CARDIOVASCULAR STRESS TEST  08/15/2010   Low nuclear study w/ no evidence ishemia/  ef 42% with lateral and apical hypokinesis  . CYSTOSCOPY WITH BIOPSY N/A 11/20/2017   Procedure: CYSTOSCOPY WITH BIOPSY AND FULGURATION;  Surgeon: [name], MD;  [facility];  Service: Urology;   Laterality: N/A;  . INCISION AND DRAINAGE RIGHT DISTAL MEDIAL THIGH HEMATOMA  04-14-2005   dr Ninfa Linden  . left arm skin graft  1976   injury  . LEFT HEART CATHETERIZATION WITH CORONARY ANGIOGRAM Bilateral 12/26/2013   Procedure: LEFT HEART CATHETERIZATION WITH CORONARY ANGIOGRAM;  Surgeon: Burnell Blanks, MD;  Location: [facility] CATH LAB;  Service: Cardiovascular;  Laterality: Bilateral;  STEMI, in setting cocaine/ alcohol :  mid disease in the RCA (20%), moderate disease in the small caliber intermediate branch (distal 50-60%), normal LVSF (ef 55-60%)  . TOOTH EXTRACTION N/A 05/16/2018   Procedure: DENTAL RESTORATION/EXTRACTIONS WITH ALVEO;  Surgeon: Diona Browner, DDS;  Location: [facility];  Service: Oral Surgery;  Laterality: N/A;  . TRANSTHORACIC ECHOCARDIOGRAM  08/05/2017   moderate LVH,  ef 55%,  grade 1 diastolic dysfunction/  mild LAE/  trivial Tr        Home Medications    Prior to Admission medications   Medication Sig Start Date End Date Taking? Authorizing Provider  amoxicillin-clavulanate (AUGMENTIN) 875-125 MG tablet Take 1 tablet by mouth every 12 (twelve) hours. 09/14/18   , Ozella Almond, PA-C  atorvastatin (LIPITOR) 80 MG tablet Take 1 tablet (80 mg total) by mouth daily. 03/07/18   [name], DO  betamethasone dipropionate 0.05 % lotion  08/21/18   [provider]  Blood Glucose Monitoring Suppl (TRUE METRIX METER) w/Device KIT Use as directed 01/17/18   Ladell Pier, MD  cetirizine (ZYRTEC) 10 MG tablet Take 1 tablet (10 mg total) by mouth daily. 09/26/17   Argentina Donovan, PA-C  furosemide (LASIX) 40 MG tablet TAKE ONE TABLET BY MOUTH EVERY MORNING 06/11/18   Ladell Pier, MD  gabapentin (NEURONTIN) 300 MG capsule TAKE 1 CAPSULE (300 MG TOTAL) BY MOUTH THREE TIMES DAILY. 03/06/18   Ladell Pier, MD  glipiZIDE (GLUCOTROL XL) 10 MG 24 hr tablet TAKE ONE TABLET BY MOUTH DAILY WITH BREAKFAST 06/03/18   Ladell Pier, MD    Glucose Blood (TRUE METRIX BLOOD GLUCOSE TEST VI) USE AS DIRECTED 03/19/18   [provider]  glucose blood (TRUE METRIX BLOOD GLUCOSE TEST) test strip Check blood sugars 1-2 times daily 01/17/18   Ladell Pier, MD  glucose blood test strip CHECK BLOOD SUGARS 1-2 TIMES DAILY 03/19/18   [provider]  labetalol (NORMODYNE) 100 MG tablet Take 1 tablet (100 mg total) by mouth 2 (two) times daily. 06/25/18   Ladell Pier, MD  Lancets Misc. (ACCU-CHEK SOFTCLIX LANCET DEV) KIT Use as directed 12/19/17   Ladell Pier, MD  lisinopril (PRINIVIL,ZESTRIL) 40 MG tablet TAKE 1 TABLET (40 MG TOTAL) BY MOUTH DAILY. 07/12/18   Ladell Pier, MD  metFORMIN (GLUCOPHAGE) 1000 MG tablet TAKE ONE TABLET BY MOUTH TWICE A DAY WITH MEALS 06/03/18   Ladell Pier, MD  triamcinolone cream (KENALOG) 0.1 % APPLY 1 APPLICATION TOPICALLY TWO (TWO) TIMES DAILY. 08/16/18  Ladell Pier, MD  TRUEPLUS LANCETS 28G MISC Use as directed 01/17/18   Ladell Pier, MD    Family History Family History  Problem Relation Age of Onset  . Hypertension Mother   . Diabetes Mother   . Cancer Mother        breast cancer   . Hypertension Father   . Heart disease Father   . Stroke Father   . Hypertension Sister     Social History Social History   Tobacco Use  . Smoking status: Former Smoker    Years: 45.00    Types: Cigarettes    Last attempt to quit: 03/03/2018    Years since quitting: 0.5  . Smokeless tobacco: Never Used  . Tobacco comment: 11-15-2017  per pt 1pp3days  Substance Use Topics  . Alcohol use: Not Currently    Alcohol/week: 0.0 standard drinks    Comment: 11-15-2017  per pt last alcohol 12/ 2018  alcohol abuse  . Drug use: Not Currently    Types: "Crack" cocaine, Cocaine    Comment: 11-15-2017  per pt last used March 2019     Allergies   Amlodipine   Review of Systems Review of Systems  Constitutional: Negative for chills and fever.  HENT: Positive  for congestion, sinus pressure and sinus pain. Negative for sore throat.   Respiratory: Positive for cough. Negative for shortness of breath.   Cardiovascular: Negative for chest pain.  Gastrointestinal: Negative for abdominal pain, nausea and vomiting.     Physical Exam Updated Vital Signs BP 136/64   Pulse 62   Temp (!) 97.4 F (36.3 C) (Oral)   Resp 18   Ht '6\' 3"'  (1.905 m)   Wt 127 kg   SpO2 95%   BMI 35.00 kg/m   Physical Exam Vitals signs and nursing note reviewed.  Constitutional:      General: He is not in acute distress.    Appearance: He is well-developed.  HENT:     Head: Normocephalic and atraumatic.     Comments: Maxillary sinus tenderness.    Nose: Congestion present.     Mouth/Throat:     Mouth: Mucous membranes are moist.     Pharynx: No oropharyngeal exudate or posterior oropharyngeal erythema.  Neck:     Musculoskeletal: Neck supple.  Cardiovascular:     Rate and Rhythm: Normal rate and regular rhythm.     Heart sounds: Normal heart sounds. No murmur.  Pulmonary:     Effort: Pulmonary effort is normal. No respiratory distress.     Breath sounds: Normal breath sounds. No wheezing or rales.  Musculoskeletal: Normal range of motion.  Skin:    General: Skin is warm and dry.  Neurological:     Mental Status: He is alert.      ED Treatments / Results  Labs (all labs ordered are listed, but only abnormal results are displayed) Labs Reviewed - No data to display  EKG None  Radiology No results found.  Procedures Procedures (including critical care time)  Medications Ordered in ED Medications - No data to display   Initial Impression / Assessment and Plan / ED Course  I have reviewed the triage vital signs and the nursing notes.  Pertinent labs & imaging results that were available during my care of the patient were reviewed by me and considered in my medical decision making (see chart for details).       Aum JOSIAN LANESE is a 58 y.o.  male who presents  to ED for progressively worsening nasal congestion and sinus pressure for the past month.  Exam, patient is afebrile, hemodynamically stable with clear lung exam. Does have focal sinus tenderness. Given duration of symptoms and sinus tenderness, will cover with Augmentin for sinusitis. Has PCP follow up in 2 weeks. Return precautions discussed and all questions answered.    Final Clinical Impressions(s) / ED Diagnoses   Final diagnoses:  Acute non-recurrent maxillary sinusitis    ED Discharge Orders         Ordered    amoxicillin-clavulanate (AUGMENTIN) 875-125 MG tablet  Every 12 hours     09/14/18 1218           , Ozella Almond, PA-C 09/14/18 1238    Malvin Johns, MD 09/14/18 1246

## 2018-09-14 NOTE — Discharge Instructions (Signed)
It was my pleasure taking care of you today!   Please take all of your antibiotics until finished!   Flonase or Nasacort nasal spray will help with nasal congestion.  Take a Claritin or Zyrtec daily.   Keep your scheduled appointment with your primary care doctor.  Please let them know at this appointment if you are not feeling any better.  Return to the emergency department for fever, new or worsening symptoms, any additional concerns.

## 2018-09-14 NOTE — ED Triage Notes (Signed)
Pt endorses nasal congestion and non-productive cough x1 month. Pt denies pain. Pt is alert, oriented, and ambulatory.

## 2018-09-14 NOTE — ED Notes (Signed)
Patient verbalizes understanding of discharge instructions. Opportunity for questioning and answers were provided. Armband removed by staff, pt discharged from ED. Prescriptions and follow up care reviewed.

## 2018-09-18 ENCOUNTER — Encounter: Payer: Self-pay | Admitting: Podiatry

## 2018-09-18 NOTE — Progress Notes (Signed)
Subjective: [name] presents with history of  diabetic neuropathy.  He is seen today for painful mycotic toenails and chronic callosities.  Both interfere with activities of daily living. Pain is aggravated when wearing enclosed shoe gear. Pain is relieved with periodic professional debridement.  He voices no new problems on today's visit.  Ladell Pier, MD is his PCP.  Last visit June 25, 2018.   Current Outpatient Medications:  .  amoxicillin-clavulanate (AUGMENTIN) 875-125 MG tablet, Take 1 tablet by mouth every 12 (twelve) hours., Disp: 14 tablet, Rfl: 0 .  atorvastatin (LIPITOR) 80 MG tablet, Take 1 tablet (80 mg total) by mouth daily., Disp: 30 tablet, Rfl: 0 .  betamethasone dipropionate 0.05 % lotion, , Disp: , Rfl:  .  Blood Glucose Monitoring Suppl (TRUE METRIX METER) w/Device KIT, Use as directed, Disp: 1 kit, Rfl: 0 .  cetirizine (ZYRTEC) 10 MG tablet, Take 1 tablet (10 mg total) by mouth daily., Disp: 30 tablet, Rfl: 11 .  furosemide (LASIX) 40 MG tablet, TAKE ONE TABLET BY MOUTH EVERY MORNING, Disp: 30 tablet, Rfl: 0 .  gabapentin (NEURONTIN) 300 MG capsule, TAKE 1 CAPSULE (300 MG TOTAL) BY MOUTH THREE TIMES DAILY., Disp: 270 capsule, Rfl: 3 .  glipiZIDE (GLUCOTROL XL) 10 MG 24 hr tablet, TAKE ONE TABLET BY MOUTH DAILY WITH BREAKFAST, Disp: 30 tablet, Rfl: 2 .  Glucose Blood (TRUE METRIX BLOOD GLUCOSE TEST VI), USE AS DIRECTED, Disp: , Rfl: 0 .  glucose blood (TRUE METRIX BLOOD GLUCOSE TEST) test strip, Check blood sugars 1-2 times daily, Disp: 100 each, Rfl: 12 .  glucose blood test strip, CHECK BLOOD SUGARS 1-2 TIMES DAILY, Disp: , Rfl: 12 .  labetalol (NORMODYNE) 100 MG tablet, Take 1 tablet (100 mg total) by mouth 2 (two) times daily., Disp: 30 tablet, Rfl: 6 .  Lancets Misc. (ACCU-CHEK SOFTCLIX LANCET DEV) KIT, Use as directed, Disp: 1 kit, Rfl: 0 .  lisinopril (PRINIVIL,ZESTRIL) 40 MG tablet, TAKE 1 TABLET (40 MG TOTAL) BY MOUTH DAILY., Disp: 90 tablet, Rfl:  0 .  metFORMIN (GLUCOPHAGE) 1000 MG tablet, TAKE ONE TABLET BY MOUTH TWICE A DAY WITH MEALS, Disp: 60 tablet, Rfl: 2 .  triamcinolone cream (KENALOG) 0.1 %, APPLY 1 APPLICATION TOPICALLY TWO (TWO) TIMES DAILY., Disp: 45 g, Rfl: 0 .  TRUEPLUS LANCETS 28G MISC, Use as directed, Disp: 100 each, Rfl: 6  Allergies  Allergen Reactions  . Amlodipine Swelling    Lower extremity swelling    Vascular Examination: Capillary refill time <3 seconds x 10 digits  Dorsalis pedis faintly palpable bilaterally  Posterior tibial pulses faintly palpable b/l  No digital hair x 10 digits  Skin temperature gradient within normal limits bilaterally  Dermatological Examination: Skin with normal turgor, texture and tone b/l  Toenails 1-5 b/l discolored, thick, dystrophic with subungual debris and pain with palpation to nailbeds due to thickness of nails.  Hyperkeratotic lesions noted sub-hallux IPJ bilaterally, submetatarsal head 4 left foot.  There is no erythema, no edema, no drainage, no flocculence noted.  Musculoskeletal: Muscle strength 5/5 to all LE muscle groups  Hallux abductovalgus with bunion deformity bilaterally  Hammertoe deformity 2 through 5 bilaterally  Neurological: Sensation diminished with 10 gram monofilament.  Vibratory sensation diminished  Assessment: 1. Painful onychomycosis toenails 1-5 b/l 2. Calluses sub-hallux bilaterally, submetatarsal head 4 left foot 3. NIDDM with Diabetic neuropathy  Plan: 1. Continue diabetic foot care principles.  Literature dispensed on today. 2. Toenails 1-5 b/l were debrided in length and  girth without iatrogenic bleeding. 3. Hyperkeratotic lesions pared sub-hallux bilaterally, submetatarsal head 4 left foot without incident. 4. Patient to continue soft, supportive shoe gear.  Prescription written for 1 pair of extra-depth shoes with heat moldable insoles for [facility]. 5. Patient to report any pedal injuries to  medical professional immediately. 6. Follow up 10 weeks. 7. Patient/POA to call should there be a concern in the interim.

## 2018-09-24 ENCOUNTER — Encounter: Payer: Self-pay | Admitting: Internal Medicine

## 2018-09-24 ENCOUNTER — Ambulatory Visit: Payer: Medicare Other | Attending: Internal Medicine | Admitting: Internal Medicine

## 2018-09-24 VITALS — BP 165/87 | HR 65 | Temp 98.2°F | Resp 16 | Wt 290.6 lb

## 2018-09-24 DIAGNOSIS — I252 Old myocardial infarction: Secondary | ICD-10-CM | POA: Insufficient documentation

## 2018-09-24 DIAGNOSIS — Z833 Family history of diabetes mellitus: Secondary | ICD-10-CM | POA: Diagnosis not present

## 2018-09-24 DIAGNOSIS — M48061 Spinal stenosis, lumbar region without neurogenic claudication: Secondary | ICD-10-CM | POA: Insufficient documentation

## 2018-09-24 DIAGNOSIS — R600 Localized edema: Secondary | ICD-10-CM | POA: Diagnosis not present

## 2018-09-24 DIAGNOSIS — IMO0002 Reserved for concepts with insufficient information to code with codable children: Secondary | ICD-10-CM

## 2018-09-24 DIAGNOSIS — Z888 Allergy status to other drugs, medicaments and biological substances status: Secondary | ICD-10-CM | POA: Diagnosis not present

## 2018-09-24 DIAGNOSIS — Z7984 Long term (current) use of oral hypoglycemic drugs: Secondary | ICD-10-CM | POA: Insufficient documentation

## 2018-09-24 DIAGNOSIS — Z87891 Personal history of nicotine dependence: Secondary | ICD-10-CM | POA: Insufficient documentation

## 2018-09-24 DIAGNOSIS — Z79899 Other long term (current) drug therapy: Secondary | ICD-10-CM | POA: Diagnosis not present

## 2018-09-24 DIAGNOSIS — Z8673 Personal history of transient ischemic attack (TIA), and cerebral infarction without residual deficits: Secondary | ICD-10-CM | POA: Diagnosis not present

## 2018-09-24 DIAGNOSIS — Z6836 Body mass index (BMI) 36.0-36.9, adult: Secondary | ICD-10-CM | POA: Insufficient documentation

## 2018-09-24 DIAGNOSIS — E1165 Type 2 diabetes mellitus with hyperglycemia: Secondary | ICD-10-CM | POA: Diagnosis not present

## 2018-09-24 DIAGNOSIS — F418 Other specified anxiety disorders: Secondary | ICD-10-CM

## 2018-09-24 DIAGNOSIS — I10 Essential (primary) hypertension: Secondary | ICD-10-CM | POA: Insufficient documentation

## 2018-09-24 DIAGNOSIS — E785 Hyperlipidemia, unspecified: Secondary | ICD-10-CM | POA: Diagnosis not present

## 2018-09-24 DIAGNOSIS — Z8249 Family history of ischemic heart disease and other diseases of the circulatory system: Secondary | ICD-10-CM | POA: Diagnosis not present

## 2018-09-24 DIAGNOSIS — E1142 Type 2 diabetes mellitus with diabetic polyneuropathy: Secondary | ICD-10-CM | POA: Diagnosis not present

## 2018-09-24 DIAGNOSIS — I2510 Atherosclerotic heart disease of native coronary artery without angina pectoris: Secondary | ICD-10-CM | POA: Insufficient documentation

## 2018-09-24 LAB — GLUCOSE, POCT (MANUAL RESULT ENTRY): POC GLUCOSE: 104 mg/dL — AB (ref 70–99)

## 2018-09-24 LAB — POCT GLYCOSYLATED HEMOGLOBIN (HGB A1C): HBA1C, POC (CONTROLLED DIABETIC RANGE): 7.5 % — AB (ref 0.0–7.0)

## 2018-09-24 MED ORDER — FUROSEMIDE 40 MG PO TABS: ORAL_TABLET | ORAL | 6 refills | Status: DC

## 2018-09-24 MED ORDER — HYDRALAZINE HCL 10 MG PO TABS: 10.0000 mg | ORAL_TABLET | Freq: Two times a day (BID) | ORAL | 6 refills | Status: DC

## 2018-09-24 NOTE — Progress Notes (Signed)
Patient ID: [name], male    DOB: [birth year]  MRN: [id number]  CC: Diabetes and Hypertension   Subjective: [name] is a 58 y.o. male who presents for chronic ds management His concerns today include:  58year old male with history ofDM2 with retinopathy and neuropathy, HTN, dCHF, former tob dep, HL, CVA (about 3 yrs ago per pt), spinal stenosis of lumbar spine,chronic lower extremity edema  Seen in ER about 1 wk ago for worsening nasal congestion x 1 mth.  Dx with sinusitis, treated with Augmentin.  Taking OTC Claritin.  Pt reports that he is breathing better. No fever.  Occasional cough.   HYPERTENSION/dCHF Currently taking: see medication list Med Adherence: '[x]'  Yes    '[]'  No Medication side effects: '[]'  Yes    '[x]'  No Adherence with salt restriction: '[x]'  Yes    '[]'  No Home Monitoring?: '[]'  Yes    '[x]'  No Monitoring Frequency: '[]'  Yes    '[]'  No Home BP results range: '[]'  Yes    '[]'  No SOB? '[]'  Yes    '[x]'  No Chest Pain?: '[]'  Yes    '[x]'  No Leg swelling?: '[]'  Yes    '[x]'  No, about the same Headaches?: '[]'  Yes    '[x]'  No Dizziness? '[]'  Yes    '[x]'  No Comments: some weakness when first stands up  DIABETES TYPE 2 Last A1C:   Results for orders placed or performed in visit on 09/24/18  POCT glucose (manual entry)  Result Value Ref Range   POC Glucose 104 (A) 70 - 99 mg/dl  POCT glycosylated hemoglobin (Hb A1C)  Result Value Ref Range   Hemoglobin A1C     HbA1c POC (<> result, manual entry)     HbA1c, POC (prediabetic range)     HbA1c, POC (controlled diabetic range) 7.5 (A) 0.0 - 7.0 %    Med Adherence:  '[x]'  Yes    '[]'  No Medication side effects:  '[]'  Yes    '[x]'  No Home Monitoring?  '[x]'  Yes, 3 x a wk in the mornings   '[]'  No Home glucose results range: 115-190, highest 215 Diet Adherence:  He feels he does okay. But feels hungry b/w meals.  Snacking on meats and chips, snack cakes. Exercise: '[x]'  Yes, did join the gym but has not been able to go as often as he would like due  to weather changes.  He last went 1 week ago.   '[]'  No Hypoglycemic episodes?: '[]'  Yes    '[x]'  No Numbness of the feet? '[x]'  Yes, saw podiatrist. DM shoes ordered    '[]'  No Retinopathy hx? '[x]'  Yes    '[]'  No Last eye exam:  Comments:   Gets nervous and lightheaded when having to cross a busy intersection. Takes SCAT most of the time.    Patient Active Problem List   Diagnosis Date Noted  . TIA (transient ischemic attack) 03/05/2018  . Benign essential HTN 03/05/2018  . Sensorineural hearing loss of both ears 12/04/2017  . Mild nonproliferative diabetic retinopathy ([place]) 01/10/2016  . Hypertensive retinopathy of both eyes 01/10/2016  . Spinal stenosis of lumbar region 07/15/2015  . Abnormality of gait 06/07/2015  . Hydrocephalus ([place]) 06/07/2015  . Peripheral neuropathy 04/26/2015  . Constipation 04/26/2015  . Diabetes mellitus type 2 with complications ([place]) 40/04/2724  . Decreased hearing of both ears 03/15/2015  . Tinea pedis 03/15/2015  . Callus of foot 05/22/2014  . Coronary vasospasm, cocaine related 12/29/2013  . Cocaine abuse ([place]) 12/28/2013  .  Left-sided weakness 12/26/2013  . Hyperlipidemia associated with type 2 diabetes mellitus ([place]) 04/01/2007  . Class 2 severe obesity due to excess calories with serious comorbidity and body mass index (BMI) of 36.0 to 36.9 in adult ([place]) 03/28/2007  . Essential hypertension 03/28/2007     Current Outpatient Medications on File Prior to Visit  Medication Sig Dispense Refill  . atorvastatin (LIPITOR) 80 MG tablet Take 1 tablet (80 mg total) by mouth daily. 30 tablet 0  . betamethasone dipropionate 0.05 % lotion     . Blood Glucose Monitoring Suppl (TRUE METRIX METER) w/Device KIT Use as directed 1 kit 0  . cetirizine (ZYRTEC) 10 MG tablet Take 1 tablet (10 mg total) by mouth daily. 30 tablet 11  . gabapentin (NEURONTIN) 300 MG capsule TAKE 1 CAPSULE (300 MG TOTAL) BY MOUTH THREE TIMES DAILY. 270 capsule 3  . glipiZIDE (GLUCOTROL XL) 10  MG 24 hr tablet TAKE ONE TABLET BY MOUTH DAILY WITH BREAKFAST 30 tablet 2  . Glucose Blood (TRUE METRIX BLOOD GLUCOSE TEST VI) USE AS DIRECTED  0  . glucose blood (TRUE METRIX BLOOD GLUCOSE TEST) test strip Check blood sugars 1-2 times daily 100 each 12  . glucose blood test strip CHECK BLOOD SUGARS 1-2 TIMES DAILY  12  . labetalol (NORMODYNE) 100 MG tablet Take 1 tablet (100 mg total) by mouth 2 (two) times daily. 30 tablet 6  . Lancets Misc. (ACCU-CHEK SOFTCLIX LANCET DEV) KIT Use as directed 1 kit 0  . lisinopril (PRINIVIL,ZESTRIL) 40 MG tablet TAKE 1 TABLET (40 MG TOTAL) BY MOUTH DAILY. 90 tablet 0  . metFORMIN (GLUCOPHAGE) 1000 MG tablet TAKE ONE TABLET BY MOUTH TWICE A DAY WITH MEALS 60 tablet 2  . triamcinolone cream (KENALOG) 0.1 % APPLY 1 APPLICATION TOPICALLY TWO (TWO) TIMES DAILY. 45 g 0  . TRUEPLUS LANCETS 28G MISC Use as directed 100 each 6   No current facility-administered medications on file prior to visit.     Allergies  Allergen Reactions  . Amlodipine Swelling    Lower extremity swelling    Social History   Socioeconomic History  . Marital status: Divorced    Spouse name: Not on file  . Number of children: 0  . Years of education: 10  . Highest education level: Not on file  Occupational History  . Occupation: Unemployed  Social Needs  . Financial resource strain: Not on file  . Food insecurity:    Worry: Not on file    Inability: Not on file  . Transportation needs:    Medical: Not on file    Non-medical: Not on file  Tobacco Use  . Smoking status: Former Smoker    Years: 45.00    Types: Cigarettes    Last attempt to quit: 03/03/2018    Years since quitting: 0.5  . Smokeless tobacco: Never Used  . Tobacco comment: 11-15-2017  per pt 1pp3days  Substance and Sexual Activity  . Alcohol use: Not Currently    Alcohol/week: 0.0 standard drinks    Comment: 11-15-2017  per pt last alcohol 12/ 2018  alcohol abuse  . Drug use: Not Currently    Types: "Crack"  cocaine, Cocaine    Comment: 11-15-2017  per pt last used March 2019  . Sexual activity: Not on file  Lifestyle  . Physical activity:    Days per week: Not on file    Minutes per session: Not on file  . Stress: Not on file  Relationships  . Social  connections:    Talks on phone: Not on file    Gets together: Not on file    Attends religious service: Not on file    Active member of club or organization: Not on file    Attends meetings of clubs or organizations: Not on file    Relationship status: Not on file  . Intimate partner violence:    Fear of current or ex partner: Not on file    Emotionally abused: Not on file    Physically abused: Not on file    Forced sexual activity: Not on file  Other Topics Concern  . Not on file  Social History Narrative   Lives at home alone.   Right-handed.   Occasional use.    Family History  Problem Relation Age of Onset  . Hypertension Mother   . Diabetes Mother   . Cancer Mother        breast cancer   . Hypertension Father   . Heart disease Father   . Stroke Father   . Hypertension Sister     Past Surgical History:  Procedure Laterality Date  . CARDIAC CATHETERIZATION  12/01/2008   dr hochrein   abnormal stress myoview:  mild coronary plaque, normal LVF  . CARDIAC CATHETERIZATION  04/29/2011   dr [place]   in setting ECG with new ST elevation & severe hypertensive:  nonobstructive atherosclerotic CAD, normal LVF (30% mRCA)   . CARDIOVASCULAR STRESS TEST  08/15/2010   Low nuclear study w/ no evidence ishemia/  ef 42% with lateral and apical hypokinesis  . CYSTOSCOPY WITH BIOPSY N/A 11/20/2017   Procedure: CYSTOSCOPY WITH BIOPSY AND FULGURATION;  Surgeon: [name], MD;  Location: [facility];  Service: Urology;  Laterality: N/A;  . INCISION AND DRAINAGE RIGHT DISTAL MEDIAL THIGH HEMATOMA  04-14-2005   dr Ninfa Linden  . left arm skin graft  1976   injury  . LEFT HEART CATHETERIZATION WITH CORONARY ANGIOGRAM Bilateral  12/26/2013   Procedure: LEFT HEART CATHETERIZATION WITH CORONARY ANGIOGRAM;  Surgeon: Burnell Blanks, MD;  Location: [facility] CATH LAB;  Service: Cardiovascular;  Laterality: Bilateral;  STEMI, in setting cocaine/ alcohol :  mid disease in the RCA (20%), moderate disease in the small caliber intermediate branch (distal 50-60%), normal LVSF (ef 55-60%)  . TOOTH EXTRACTION N/A 05/16/2018   Procedure: DENTAL RESTORATION/EXTRACTIONS WITH ALVEO;  Surgeon: Diona Browner, DDS;  Location: [place];  Service: Oral Surgery;  Laterality: N/A;  . TRANSTHORACIC ECHOCARDIOGRAM  08/05/2017   moderate LVH,  ef 55%,  grade 1 diastolic dysfunction/  mild LAE/  trivial Tr    ROS: Review of Systems Negative except as stated above  PHYSICAL EXAM: BP (!) 165/87   Pulse 65   Temp 98.2 F (36.8 C) (Oral)   Resp 16   Wt 290 lb 9.6 oz (131.8 kg)   SpO2 100%   BMI 36.32 kg/m   Wt Readings from Last 3 Encounters:  09/24/18 290 lb 9.6 oz (131.8 kg)  09/14/18 280 lb (127 kg)  06/25/18 291 lb 12.8 oz (132.4 kg)   Physical Exam  General appearance - alert, well appearing, older African-American male, obese and in no distress.  Patient ambulates with a cane. Mental status - normal mood, behavior, speech, dress, motor activity, and thought processes Mouth - mucous membranes moist, pharynx normal without lesions Neck - supple, no significant adenopathy Chest - clear to auscultation, no wheezes, rales or rhonchi, symmetric air entry Heart - normal rate,  regular rhythm, normal S1, S2, no murmurs, rubs, clicks or gallops Extremities -trace to 1+ lower extremity edema bilaterally Diabetic Foot Exam - Simple   Simple Foot Form Visual Inspection See comments:  Yes Sensation Testing See comments:  Yes Pulse Check Posterior Tibialis and Dorsalis pulse intact bilaterally:  Yes Comments Patient callus on the plantar surface of the left big toe.  He also has a callus on the lateral aspect of the ball  of the foot.  Small callus noted on the right big toe plantar surface.  All calluses appear shaved.  Toenails are thick and discolored but not overgrown.     CMP Latest Ref Rng & Units 05/13/2018 03/05/2018 03/04/2018  Glucose 70 - 99 mg/dL 171(H) 148(H) 161(H)  BUN 6 - 20 mg/dL 21(H) 23(H) 29(H)  Creatinine 0.61 - 1.24 mg/dL 1.16 1.19 1.53(H)  Sodium 135 - 145 mmol/L 139 141 142  Potassium 3.5 - 5.1 mmol/L 4.8 4.8 4.6  Chloride 98 - 111 mmol/L 105 108 109  CO2 22 - 32 mmol/L '26 25 25  ' Calcium 8.9 - 10.3 mg/dL 9.2 9.5 9.2  Total Protein 6.5 - 8.1 g/dL - 7.1 -  Total Bilirubin 0.3 - 1.2 mg/dL - 0.7 -  Alkaline Phos 38 - 126 U/L - 58 -  AST 15 - 41 U/L - 18 -  ALT 0 - 44 U/L - 17 -   Lipid Panel     Component Value Date/Time   CHOL 164 03/05/2018 0731   TRIG 91 03/05/2018 0731   HDL 48 03/05/2018 0731   CHOLHDL 3.4 03/05/2018 0731   VLDL 18 03/05/2018 0731   LDLCALC 98 03/05/2018 0731    CBC    Component Value Date/Time   WBC 7.1 03/05/2018 0731   RBC 4.86 03/05/2018 0731   HGB 13.5 03/05/2018 0731   HGB 13.3 12/18/2017 1621   HCT 42.3 03/05/2018 0731   HCT 41.5 12/18/2017 1621   PLT 196 03/05/2018 0731   PLT 214 12/18/2017 1621   MCV 87.0 03/05/2018 0731   MCV 86 12/18/2017 1621   MCH 27.8 03/05/2018 0731   MCHC 31.9 03/05/2018 0731   RDW 13.2 03/05/2018 0731   RDW 14.0 12/18/2017 1621   LYMPHSABS 1.5 08/04/2017 1052   MONOABS 1.2 (H) 08/04/2017 1052   EOSABS 0.1 08/04/2017 1052   BASOSABS 0.0 08/04/2017 1052   Results for orders placed or performed in visit on 09/24/18  POCT glucose (manual entry)  Result Value Ref Range   POC Glucose 104 (A) 70 - 99 mg/dl  POCT glycosylated hemoglobin (Hb A1C)  Result Value Ref Range   Hemoglobin A1C     HbA1c POC (<> result, manual entry)     HbA1c, POC (prediabetic range)     HbA1c, POC (controlled diabetic range) 7.5 (A) 0.0 - 7.0 %    ASSESSMENT AND PLAN: 1. Uncontrolled type 2 diabetes mellitus with peripheral  neuropathy (HCC) A1c has increased from last visit.  Patient would like to hold off on adding another diabetic medication.  He wants to work on getting this back down by improving on his eating habits.  I encouraged him to purchase healthier snacks like fruits and nuts instead of chips cakes.  He plans to get to the gym a little bit more now that the weather is warming up. - POCT glucose (manual entry) - POCT glycosylated hemoglobin (Hb A1C)  2. Class 2 severe obesity due to excess calories with serious comorbidity and body mass index (BMI)  of 36.0 to 36.9 in adult [facility]) See #1 above  3. Essential hypertension Not at goal.  We have added a low-dose of blood pressure medicine called hydralazine that he will take twice a day.  Encourage low-salt diet. Of given some extra furosemide for him to take an extra dose for 2 days when needed for increased lower extremity edema Appointment with clinical pharmacist in 2 weeks for repeat blood pressure check - hydrALAZINE (APRESOLINE) 10 MG tablet; Take 1 tablet (10 mg total) by mouth 2 (two) times daily.  Dispense: 60 tablet; Refill: 6 - furosemide (LASIX) 40 MG tablet; 1 tab PO daily.  Take an extra one for two days PRN for increase lower extremity swelling  Dispense: 40 tablet; Refill: 6  4. Situational anxiety Advised patient to use designated crossings when crossing the street     Patient was given the opportunity to ask questions.  Patient verbalized understanding of the plan and was able to repeat key elements of the plan.   Orders Placed This Encounter  Procedures  . POCT glucose (manual entry)  . POCT glycosylated hemoglobin (Hb A1C)     Requested Prescriptions   Signed Prescriptions Disp Refills  . hydrALAZINE (APRESOLINE) 10 MG tablet 60 tablet 6    Sig: Take 1 tablet (10 mg total) by mouth 2 (two) times daily.  . furosemide (LASIX) 40 MG tablet 40 tablet 6    Sig: 1 tab PO daily.  Take an extra one for two days PRN for increase  lower extremity swelling    No follow-ups on file.  Karle Plumber, MD, FACP

## 2018-09-24 NOTE — Patient Instructions (Signed)
Please give patient an appointment to see the clinical pharmacist for blood pressure recheck in about 2 weeks.  Your diabetes is not controlled.  Your A1c today was 7.5 with goal being less than 7.  As discussed, please avoid snacking on junk foods.  Try to purchase healthier snacks like fruits and peanuts.  Try to get to the gym at least twice a week to exercise.  Your blood pressure is not controlled.  We have added a blood pressure medication called hydralazine 10 mg twice a day.  Try to limit salt in the foods as much as possible.

## 2018-09-25 ENCOUNTER — Encounter (INDEPENDENT_AMBULATORY_CARE_PROVIDER_SITE_OTHER): Payer: Medicare Other | Admitting: Ophthalmology

## 2018-10-07 ENCOUNTER — Encounter (INDEPENDENT_AMBULATORY_CARE_PROVIDER_SITE_OTHER): Payer: Medicare Other | Admitting: Ophthalmology

## 2018-10-07 ENCOUNTER — Encounter: Payer: Self-pay | Admitting: Internal Medicine

## 2018-10-07 ENCOUNTER — Other Ambulatory Visit: Payer: Self-pay

## 2018-10-07 DIAGNOSIS — I10 Essential (primary) hypertension: Secondary | ICD-10-CM

## 2018-10-07 DIAGNOSIS — H43813 Vitreous degeneration, bilateral: Secondary | ICD-10-CM

## 2018-10-07 DIAGNOSIS — E11319 Type 2 diabetes mellitus with unspecified diabetic retinopathy without macular edema: Secondary | ICD-10-CM | POA: Diagnosis not present

## 2018-10-07 DIAGNOSIS — H35033 Hypertensive retinopathy, bilateral: Secondary | ICD-10-CM | POA: Diagnosis not present

## 2018-10-07 DIAGNOSIS — E113393 Type 2 diabetes mellitus with moderate nonproliferative diabetic retinopathy without macular edema, bilateral: Secondary | ICD-10-CM

## 2018-10-07 LAB — HM DIABETES EYE EXAM

## 2018-10-08 ENCOUNTER — Ambulatory Visit: Payer: Medicare Other | Attending: Family Medicine | Admitting: Pharmacist

## 2018-10-08 ENCOUNTER — Other Ambulatory Visit: Payer: Self-pay

## 2018-10-08 ENCOUNTER — Encounter: Payer: Self-pay | Admitting: Pharmacist

## 2018-10-08 VITALS — BP 145/78 | HR 61

## 2018-10-08 DIAGNOSIS — Z823 Family history of stroke: Secondary | ICD-10-CM | POA: Diagnosis not present

## 2018-10-08 DIAGNOSIS — I10 Essential (primary) hypertension: Secondary | ICD-10-CM | POA: Diagnosis not present

## 2018-10-08 DIAGNOSIS — Z87891 Personal history of nicotine dependence: Secondary | ICD-10-CM | POA: Insufficient documentation

## 2018-10-08 DIAGNOSIS — Z8249 Family history of ischemic heart disease and other diseases of the circulatory system: Secondary | ICD-10-CM | POA: Diagnosis not present

## 2018-10-08 DIAGNOSIS — E119 Type 2 diabetes mellitus without complications: Secondary | ICD-10-CM | POA: Diagnosis not present

## 2018-10-08 DIAGNOSIS — Z79899 Other long term (current) drug therapy: Secondary | ICD-10-CM | POA: Diagnosis not present

## 2018-10-08 DIAGNOSIS — Z8673 Personal history of transient ischemic attack (TIA), and cerebral infarction without residual deficits: Secondary | ICD-10-CM | POA: Diagnosis not present

## 2018-10-08 DIAGNOSIS — E785 Hyperlipidemia, unspecified: Secondary | ICD-10-CM | POA: Insufficient documentation

## 2018-10-08 NOTE — Progress Notes (Signed)
   S:    PCP: Dr. Wynetta Emery   89 AA male with PMH significant for HTN, cocaine abuse w/ coronary vasospasm, T2DM, hyperlipidemia.   Patient arrives in good spirits. Presents to the clinic for hypertension management. Patient was referred by Dr. Wynetta Emery on 09/24/18.  BP at that visit 165/87 - Dr. Wynetta Emery added hydralazine to regimen.  Patient reports adherence with medications. Denies chest pain, dyspnea, HA or blurred vision.   Current BP Medications include:  Hydralazine 10 mg BID, labetalol 100 mg BID, lisinopril 40 mg daily  Antihypertensives tried in the past include: HCTZ (stopped and furosemide substituted for BLE edema)  Dietary habits include:  - reports limiting salt - does consume 1 cup of coffee & 2 glasses of tea in the morning Exercise habits include:  - limited d/t TIA Family / Social history:  - FHx significant for HTN (mother, father, and sister), stroke (father), heart disease (father) - quit smoking 8 months ago - denies alcohol or cocaine use   Home BP readings: not taking  O:  L arm after 5 minutes rest: 145/78, HR 61  Last 3 Office BP readings: BP Readings from Last 3 Encounters:  09/24/18 (!) 165/87  09/14/18 136/64  06/25/18 (!) 163/85   BMET    Component Value Date/Time   NA 139 05/13/2018 1457   NA 141 12/18/2017 1621   K 4.8 05/13/2018 1457   CL 105 05/13/2018 1457   CO2 26 05/13/2018 1457   GLUCOSE 171 (H) 05/13/2018 1457   BUN 21 (H) 05/13/2018 1457   BUN 30 (H) 12/18/2017 1621   CREATININE 1.16 05/13/2018 1457   CREATININE 0.89 06/24/2015 0905   CALCIUM 9.2 05/13/2018 1457   GFRNONAA >60 05/13/2018 1457   GFRAA >60 05/13/2018 1457   Renal function: CrCl cannot be calculated (Patient's most recent lab result is older than the maximum 21 days allowed.).  Clinical ASCVD: Yes  - TIA  A/P: Hypertension longstanding currently uncontrolled on current medications. BP Goal <130/80 mmHg. Patient is adherent with current medications. His therapy  was modified about two weeks ago and he has yet to take today's medications. No changes right now; pt to take morning BP meds when he returns home. Plan to reassess in 1 month. -Continued current regimen.  -Counseled on lifestyle modifications for blood pressure control including reduced dietary sodium, increased exercise, adequate sleep -HM: UTD on vaccines   Results reviewed and written information provided. Total time in face-to-face counseling 15 minutes.   F/U Clinic Visit in 1 month.    Patient seen with: Bobbye Riggs, PharmD Candidate  [employer]  Class of 2022  Benard Halsted, PharmD, [place] [phone]

## 2018-10-08 NOTE — Patient Instructions (Signed)
Thank you for coming to see us today.   Blood pressure today is improving.  Continue taking blood pressure medications as prescribed.   Limiting salt and caffeine, as well as exercising as able for at least 30 minutes for 5 days out of the week, can also help you lower your blood pressure.  Take your blood pressure at home if you are able. Please write down these numbers and bring them to your visits.  If you have any questions about medications, please call me [phone].  [name]  

## 2018-10-09 ENCOUNTER — Other Ambulatory Visit: Payer: Self-pay | Admitting: Internal Medicine

## 2018-10-28 ENCOUNTER — Ambulatory Visit: Payer: Medicare Other | Admitting: Adult Health

## 2018-11-07 ENCOUNTER — Telehealth: Payer: Self-pay

## 2018-11-07 NOTE — Telephone Encounter (Signed)
Order for home PT received from [facility]. Call placed to [facility] # [phone] to inquire about the order.  She explained that they have been doing well care calls for patients that they have seen and live and they were checking on patient.  He requested home PT.  Call placed to patient. He said that his legs are weak, left weaker than right. He explained that he had home PT in the past and it helped and he would like to receive again. Informed him that Dr Wynetta Emery would be notified.

## 2018-11-08 ENCOUNTER — Encounter: Payer: Self-pay | Admitting: Pharmacist

## 2018-11-08 ENCOUNTER — Ambulatory Visit: Payer: Medicare Other | Attending: Family Medicine | Admitting: Pharmacist

## 2018-11-08 ENCOUNTER — Other Ambulatory Visit: Payer: Self-pay

## 2018-11-08 VITALS — BP 137/76 | HR 64

## 2018-11-08 DIAGNOSIS — E785 Hyperlipidemia, unspecified: Secondary | ICD-10-CM | POA: Diagnosis not present

## 2018-11-08 DIAGNOSIS — I201 Angina pectoris with documented spasm: Secondary | ICD-10-CM | POA: Diagnosis not present

## 2018-11-08 DIAGNOSIS — Z87891 Personal history of nicotine dependence: Secondary | ICD-10-CM | POA: Insufficient documentation

## 2018-11-08 DIAGNOSIS — Z8249 Family history of ischemic heart disease and other diseases of the circulatory system: Secondary | ICD-10-CM | POA: Insufficient documentation

## 2018-11-08 DIAGNOSIS — F1410 Cocaine abuse, uncomplicated: Secondary | ICD-10-CM | POA: Diagnosis not present

## 2018-11-08 DIAGNOSIS — I10 Essential (primary) hypertension: Secondary | ICD-10-CM | POA: Insufficient documentation

## 2018-11-08 DIAGNOSIS — E119 Type 2 diabetes mellitus without complications: Secondary | ICD-10-CM | POA: Diagnosis not present

## 2018-11-08 DIAGNOSIS — Z79899 Other long term (current) drug therapy: Secondary | ICD-10-CM | POA: Insufficient documentation

## 2018-11-08 NOTE — Patient Instructions (Signed)
Thank you for coming to see [name] today.   Blood pressure today looks good.  Continue taking blood pressure medications as prescribed.   Limiting salt and caffeine, as well as exercising as able for at least 30 minutes for 5 days out of the week, can also help you lower your blood pressure.  Take your blood pressure at home if you are able. Please write down these numbers and bring them to your visits.  If you have any questions about medications, please call me [phone].  [name]

## 2018-11-08 NOTE — Progress Notes (Signed)
   S:    PCP: Dr. Wynetta Emery   60 AA male with PMH significant for HTN, cocaine abuse w/ coronary vasospasm, T2DM, hyperlipidemia.   Patient arrives in good spirits. Presents to the clinic for hypertension management. Patient was referred by Dr. Wynetta Emery on 09/24/18.  BP at that visit 165/87 - Dr. Wynetta Emery added hydralazine to regimen. I last saw him on 10/08/18 but made no changes as his BP was improving.   Patient sometimes skips BP medications - "sometimes I feel too tired". Took this morning.   Denies chest pain, dyspnea, HA or blurred vision. Reports improvement with LE swelling.   Current BP Medications include:  Furosemide 40 mg daily, Hydralazine 10 mg BID, labetalol 100 mg BID, lisinopril 40 mg daily  Antihypertensives tried in the past include: HCTZ (stopped and furosemide substituted for BLE edema), amlodipine (stopped d/t swelling)  Dietary habits include:  - reports limiting salt - does consume 1 cup of coffee every other day Exercise habits include:  - limited d/t TIA Family / Social history:  - FHx significant for HTN (mother, father, and sister), stroke (father), heart disease (father) - quit smoking 8 months ago - denies alcohol or cocaine use   Home BP readings: not taking  O:  L arm after 5 minutes rest: 145/78, HR 61  Last 3 Office BP readings: BP Readings from Last 3 Encounters:  11/08/18 137/76  10/08/18 (!) 145/78  09/24/18 (!) 165/87   BMET    Component Value Date/Time   NA 139 05/13/2018 1457   NA 141 12/18/2017 1621   K 4.8 05/13/2018 1457   CL 105 05/13/2018 1457   CO2 26 05/13/2018 1457   GLUCOSE 171 (H) 05/13/2018 1457   BUN 21 (H) 05/13/2018 1457   BUN 30 (H) 12/18/2017 1621   CREATININE 1.16 05/13/2018 1457   CREATININE 0.89 06/24/2015 0905   CALCIUM 9.2 05/13/2018 1457   GFRNONAA >60 05/13/2018 1457   GFRAA >60 05/13/2018 1457   Renal function: CrCl cannot be calculated (Patient's most recent lab result is older than the maximum 21 days  allowed.).  Clinical ASCVD: Yes  - TIA  A/P: Hypertension longstanding technically above goal on current medications. BP Goal <130/80 mmHg. Patient is adherent with current medications. We should strive for his BP goal but pt is a high fall risk and complains of occasional fatigue/dizziness with his current regimen. Additionally, he frequently omits doses of his antihypertensives. Recommend to continue current regimen for now. His BP control has improved significantly with addition of hydralazine.  -Continued current regimen.  -Commended patient on control; encouraged continued compliance. -Counseled on lifestyle modifications for blood pressure control including reduced dietary sodium, increased exercise, adequate sleep -HM: UTD on vaccines   Results reviewed and written information provided. Total time in face-to-face counseling 15 minutes.   F/U Clinic Visit in 1 month.    Benard Halsted, PharmD, [place] [phone]

## 2018-11-12 ENCOUNTER — Telehealth: Payer: Self-pay | Admitting: Internal Medicine

## 2018-11-12 NOTE — Telephone Encounter (Signed)
As per Ebbie Latus, CMA, the referral for home PT was signed and faxed to [facility] yesterday  Call returned to [facility] with [facility]. She explained that she needed the visit office visit note from 09/24/2018 that addressed the need for home PT.  Explained to her that it is not addressed in that note. The need for PT was determined when a well care call was made to the patient by [facility] staff, so there it not a note that addresses this need.

## 2018-11-12 NOTE — Telephone Encounter (Signed)
[name] from [facility] called in wanted to be faxed over an Office visit note from 3/3 [phone]

## 2018-11-13 ENCOUNTER — Other Ambulatory Visit: Payer: Self-pay | Admitting: Internal Medicine

## 2018-11-13 DIAGNOSIS — E118 Type 2 diabetes mellitus with unspecified complications: Secondary | ICD-10-CM

## 2018-11-16 DIAGNOSIS — M48061 Spinal stenosis, lumbar region without neurogenic claudication: Secondary | ICD-10-CM | POA: Diagnosis not present

## 2018-11-16 DIAGNOSIS — I5030 Unspecified diastolic (congestive) heart failure: Secondary | ICD-10-CM | POA: Diagnosis not present

## 2018-11-16 DIAGNOSIS — Z7984 Long term (current) use of oral hypoglycemic drugs: Secondary | ICD-10-CM | POA: Diagnosis not present

## 2018-11-16 DIAGNOSIS — I110 Hypertensive heart disease with heart failure: Secondary | ICD-10-CM | POA: Diagnosis not present

## 2018-11-16 DIAGNOSIS — Z8673 Personal history of transient ischemic attack (TIA), and cerebral infarction without residual deficits: Secondary | ICD-10-CM | POA: Diagnosis not present

## 2018-11-16 DIAGNOSIS — E1142 Type 2 diabetes mellitus with diabetic polyneuropathy: Secondary | ICD-10-CM | POA: Diagnosis not present

## 2018-11-16 DIAGNOSIS — E1165 Type 2 diabetes mellitus with hyperglycemia: Secondary | ICD-10-CM | POA: Diagnosis not present

## 2018-11-16 DIAGNOSIS — Z87891 Personal history of nicotine dependence: Secondary | ICD-10-CM | POA: Diagnosis not present

## 2018-11-19 ENCOUNTER — Ambulatory Visit: Payer: Medicare Other | Admitting: Podiatry

## 2018-11-19 ENCOUNTER — Telehealth: Payer: Self-pay | Admitting: Internal Medicine

## 2018-11-19 NOTE — Telephone Encounter (Signed)
Returned [place] from [place] and provided verbal orders for pt and if she has any question or concerns to give me a call

## 2018-11-19 NOTE — Telephone Encounter (Signed)
[name] states she faxed over orders for home health orders.  1 time a week for one week for PT 2 times a week for 4 week 1 time a week for 2 weeks.

## 2018-11-20 ENCOUNTER — Telehealth: Payer: Self-pay | Admitting: Internal Medicine

## 2018-11-20 DIAGNOSIS — I110 Hypertensive heart disease with heart failure: Secondary | ICD-10-CM | POA: Diagnosis not present

## 2018-11-20 DIAGNOSIS — Z8673 Personal history of transient ischemic attack (TIA), and cerebral infarction without residual deficits: Secondary | ICD-10-CM | POA: Diagnosis not present

## 2018-11-20 DIAGNOSIS — I5030 Unspecified diastolic (congestive) heart failure: Secondary | ICD-10-CM | POA: Diagnosis not present

## 2018-11-20 DIAGNOSIS — E1142 Type 2 diabetes mellitus with diabetic polyneuropathy: Secondary | ICD-10-CM | POA: Diagnosis not present

## 2018-11-20 DIAGNOSIS — E1165 Type 2 diabetes mellitus with hyperglycemia: Secondary | ICD-10-CM | POA: Diagnosis not present

## 2018-11-20 DIAGNOSIS — M48061 Spinal stenosis, lumbar region without neurogenic claudication: Secondary | ICD-10-CM | POA: Diagnosis not present

## 2018-11-20 MED ORDER — ATORVASTATIN CALCIUM 40 MG PO TABS: 40.0000 mg | ORAL_TABLET | Freq: Every day | ORAL | 6 refills | Status: DC

## 2018-11-20 NOTE — Telephone Encounter (Signed)
I received a note from physical therapist Forbes Cellar with [facility] requesting authorization for recommended home physical therapy sessions.  She also noted that atorvastatin and glipizide are on patient's med list but patient does not have those in his home and wanted to confirm whether he should still be taking these medications.  She also wanted clarification on how often patient should check his blood sugar.  On review of patient's chart, I note that the last atorvastatin and Glipizide prescriptions were written last year without refills.  Will RF Lipitor but dec dose to 40 mg daily.   Will have CMA call pt and inquire how long he has been out of Glipizide.  Will have him check BS daily before BF for 2 wks then bring in readings to met with clinical pharmacist for review and to determine whether Glipize needs to be restarted.Marland Kitchen

## 2018-11-21 ENCOUNTER — Telehealth: Payer: Self-pay | Admitting: Internal Medicine

## 2018-11-21 DIAGNOSIS — I110 Hypertensive heart disease with heart failure: Secondary | ICD-10-CM | POA: Diagnosis not present

## 2018-11-21 DIAGNOSIS — Z8673 Personal history of transient ischemic attack (TIA), and cerebral infarction without residual deficits: Secondary | ICD-10-CM | POA: Diagnosis not present

## 2018-11-21 DIAGNOSIS — M48061 Spinal stenosis, lumbar region without neurogenic claudication: Secondary | ICD-10-CM | POA: Diagnosis not present

## 2018-11-21 DIAGNOSIS — E1165 Type 2 diabetes mellitus with hyperglycemia: Secondary | ICD-10-CM | POA: Diagnosis not present

## 2018-11-21 DIAGNOSIS — I5030 Unspecified diastolic (congestive) heart failure: Secondary | ICD-10-CM | POA: Diagnosis not present

## 2018-11-21 DIAGNOSIS — E1142 Type 2 diabetes mellitus with diabetic polyneuropathy: Secondary | ICD-10-CM | POA: Diagnosis not present

## 2018-11-21 NOTE — Telephone Encounter (Signed)
Will forward to pcp

## 2018-11-21 NOTE — Telephone Encounter (Signed)
Princess called to inform pcp that patients BP is at 190/90 without him taken his meds. Please follow up

## 2018-11-21 NOTE — Telephone Encounter (Signed)
Contacted pt to go over Dr. Wynetta Emery message. Pt states he is not taking the liptor because it was discontinued. Pt states he is still taking the glipizide.   Pt is schedule to see luke in 2 weeks and pt is aware to check blood sugar

## 2018-11-22 ENCOUNTER — Ambulatory Visit: Payer: Medicare Other | Admitting: Podiatry

## 2018-11-22 NOTE — Telephone Encounter (Signed)
Returned [name] from [place] to go over Dr. Wynetta Emery response. [name] doesn't have any questions or concerns

## 2018-11-22 NOTE — Telephone Encounter (Signed)
Contacted pt and went over Dr. Johnson response pt is aware and doesn't have any questions or concerns 

## 2018-11-26 DIAGNOSIS — Z8673 Personal history of transient ischemic attack (TIA), and cerebral infarction without residual deficits: Secondary | ICD-10-CM | POA: Diagnosis not present

## 2018-11-26 DIAGNOSIS — E1165 Type 2 diabetes mellitus with hyperglycemia: Secondary | ICD-10-CM | POA: Diagnosis not present

## 2018-11-26 DIAGNOSIS — M48061 Spinal stenosis, lumbar region without neurogenic claudication: Secondary | ICD-10-CM | POA: Diagnosis not present

## 2018-11-26 DIAGNOSIS — I110 Hypertensive heart disease with heart failure: Secondary | ICD-10-CM | POA: Diagnosis not present

## 2018-11-26 DIAGNOSIS — E1142 Type 2 diabetes mellitus with diabetic polyneuropathy: Secondary | ICD-10-CM | POA: Diagnosis not present

## 2018-11-26 DIAGNOSIS — I5030 Unspecified diastolic (congestive) heart failure: Secondary | ICD-10-CM | POA: Diagnosis not present

## 2018-11-28 DIAGNOSIS — Z8673 Personal history of transient ischemic attack (TIA), and cerebral infarction without residual deficits: Secondary | ICD-10-CM | POA: Diagnosis not present

## 2018-11-28 DIAGNOSIS — E1165 Type 2 diabetes mellitus with hyperglycemia: Secondary | ICD-10-CM | POA: Diagnosis not present

## 2018-11-28 DIAGNOSIS — M48061 Spinal stenosis, lumbar region without neurogenic claudication: Secondary | ICD-10-CM | POA: Diagnosis not present

## 2018-11-28 DIAGNOSIS — E1142 Type 2 diabetes mellitus with diabetic polyneuropathy: Secondary | ICD-10-CM | POA: Diagnosis not present

## 2018-11-28 DIAGNOSIS — I5030 Unspecified diastolic (congestive) heart failure: Secondary | ICD-10-CM | POA: Diagnosis not present

## 2018-11-28 DIAGNOSIS — I110 Hypertensive heart disease with heart failure: Secondary | ICD-10-CM | POA: Diagnosis not present

## 2018-12-03 DIAGNOSIS — I5030 Unspecified diastolic (congestive) heart failure: Secondary | ICD-10-CM | POA: Diagnosis not present

## 2018-12-03 DIAGNOSIS — Z8673 Personal history of transient ischemic attack (TIA), and cerebral infarction without residual deficits: Secondary | ICD-10-CM | POA: Diagnosis not present

## 2018-12-03 DIAGNOSIS — I110 Hypertensive heart disease with heart failure: Secondary | ICD-10-CM | POA: Diagnosis not present

## 2018-12-03 DIAGNOSIS — M48061 Spinal stenosis, lumbar region without neurogenic claudication: Secondary | ICD-10-CM | POA: Diagnosis not present

## 2018-12-03 DIAGNOSIS — E1142 Type 2 diabetes mellitus with diabetic polyneuropathy: Secondary | ICD-10-CM | POA: Diagnosis not present

## 2018-12-03 DIAGNOSIS — E1165 Type 2 diabetes mellitus with hyperglycemia: Secondary | ICD-10-CM | POA: Diagnosis not present

## 2018-12-03 NOTE — Progress Notes (Signed)
   S:    PCP: Dr. Wynetta Emery  Patient arrives in good spirits. Presents to the clinic for hypertension management. Patient was referred by Dr. Wynetta Emery on 09/24/18. I last saw him on 11/08/18 and his BP was improved. Continued current regimen.   Patient reports adherence medications.   Denies chest pain, dyspnea, HA or blurred vision. Reports improvement with LE swelling.   Current BP Medications include:  Furosemide 40 mg daily, Hydralazine 10 mg BID, labetalol 100 mg BID, lisinopril 40 mg daily  Antihypertensives tried in the past include: HCTZ (stopped and furosemide substituted for BLE edema), amlodipine (stopped d/t swelling)  Dietary habits include:  - reports limiting salt - does consume 1 cup of coffee every other day Exercise habits include:  - limited d/t TIA Family / Social history:  - FHx significant for HTN (mother, father, and sister), stroke (father), heart disease (father) - quit smoking 8 months ago - denies alcohol or cocaine use   Home BP readings: not taking  O:  L arm after 5 minutes rest: 123/74, HR 74  Last 3 Office BP readings: BP Readings from Last 3 Encounters:  12/04/18 123/74  11/08/18 137/76  10/08/18 (!) 145/78   BMET    Component Value Date/Time   NA 139 05/13/2018 1457   NA 141 12/18/2017 1621   K 4.8 05/13/2018 1457   CL 105 05/13/2018 1457   CO2 26 05/13/2018 1457   GLUCOSE 171 (H) 05/13/2018 1457   BUN 21 (H) 05/13/2018 1457   BUN 30 (H) 12/18/2017 1621   CREATININE 1.16 05/13/2018 1457   CREATININE 0.89 06/24/2015 0905   CALCIUM 9.2 05/13/2018 1457   GFRNONAA >60 05/13/2018 1457   GFRAA >60 05/13/2018 1457   Renal function: CrCl cannot be calculated (Patient's most recent lab result is older than the maximum 21 days allowed.).  Clinical ASCVD: Yes  - TIA  A/P: Hypertension longstanding currently controlled on current medications. BP Goal <130/80 mmHg. Patient is adherent with current medications. -Continued current regimen.   -Commended patient on control; encouraged continued compliance. -Counseled on lifestyle modifications for blood pressure control including reduced dietary sodium, increased exercise, adequate sleep -HM: UTD on vaccines   Results reviewed and written information provided. Total time in face-to-face counseling 15 minutes.   F/U w/ PCP in June.    Benard Halsted, PharmD, [place] [phone]

## 2018-12-04 ENCOUNTER — Other Ambulatory Visit: Payer: Self-pay

## 2018-12-04 ENCOUNTER — Ambulatory Visit: Payer: Medicare Other | Attending: Internal Medicine | Admitting: Pharmacist

## 2018-12-04 ENCOUNTER — Encounter: Payer: Self-pay | Admitting: Pharmacist

## 2018-12-04 VITALS — BP 123/74 | HR 74

## 2018-12-04 DIAGNOSIS — Z7901 Long term (current) use of anticoagulants: Secondary | ICD-10-CM | POA: Diagnosis not present

## 2018-12-04 DIAGNOSIS — Z87891 Personal history of nicotine dependence: Secondary | ICD-10-CM | POA: Diagnosis not present

## 2018-12-04 DIAGNOSIS — Z8249 Family history of ischemic heart disease and other diseases of the circulatory system: Secondary | ICD-10-CM | POA: Diagnosis not present

## 2018-12-04 DIAGNOSIS — I10 Essential (primary) hypertension: Secondary | ICD-10-CM | POA: Insufficient documentation

## 2018-12-04 DIAGNOSIS — Z79899 Other long term (current) drug therapy: Secondary | ICD-10-CM | POA: Diagnosis not present

## 2018-12-04 NOTE — Patient Instructions (Signed)
Thank you for coming to see us today.   Blood pressure today is well-controlled.   Continue taking blood pressure medications as prescribed.   Limiting salt and caffeine, as well as exercising as able for at least 30 minutes for 5 days out of the week, can also help you lower your blood pressure.  Take your blood pressure at home if you are able. Please write down these numbers and bring them to your visits.  If you have any questions about medications, please call me [phone].  [name]  

## 2018-12-05 DIAGNOSIS — Z8673 Personal history of transient ischemic attack (TIA), and cerebral infarction without residual deficits: Secondary | ICD-10-CM | POA: Diagnosis not present

## 2018-12-05 DIAGNOSIS — E1165 Type 2 diabetes mellitus with hyperglycemia: Secondary | ICD-10-CM | POA: Diagnosis not present

## 2018-12-05 DIAGNOSIS — M48061 Spinal stenosis, lumbar region without neurogenic claudication: Secondary | ICD-10-CM | POA: Diagnosis not present

## 2018-12-05 DIAGNOSIS — I110 Hypertensive heart disease with heart failure: Secondary | ICD-10-CM | POA: Diagnosis not present

## 2018-12-05 DIAGNOSIS — I5030 Unspecified diastolic (congestive) heart failure: Secondary | ICD-10-CM | POA: Diagnosis not present

## 2018-12-05 DIAGNOSIS — E1142 Type 2 diabetes mellitus with diabetic polyneuropathy: Secondary | ICD-10-CM | POA: Diagnosis not present

## 2018-12-09 DIAGNOSIS — I110 Hypertensive heart disease with heart failure: Secondary | ICD-10-CM | POA: Diagnosis not present

## 2018-12-09 DIAGNOSIS — M48061 Spinal stenosis, lumbar region without neurogenic claudication: Secondary | ICD-10-CM | POA: Diagnosis not present

## 2018-12-09 DIAGNOSIS — E1142 Type 2 diabetes mellitus with diabetic polyneuropathy: Secondary | ICD-10-CM | POA: Diagnosis not present

## 2018-12-09 DIAGNOSIS — Z8673 Personal history of transient ischemic attack (TIA), and cerebral infarction without residual deficits: Secondary | ICD-10-CM | POA: Diagnosis not present

## 2018-12-09 DIAGNOSIS — E1165 Type 2 diabetes mellitus with hyperglycemia: Secondary | ICD-10-CM | POA: Diagnosis not present

## 2018-12-09 DIAGNOSIS — I5030 Unspecified diastolic (congestive) heart failure: Secondary | ICD-10-CM | POA: Diagnosis not present

## 2018-12-12 DIAGNOSIS — I5030 Unspecified diastolic (congestive) heart failure: Secondary | ICD-10-CM | POA: Diagnosis not present

## 2018-12-12 DIAGNOSIS — E1142 Type 2 diabetes mellitus with diabetic polyneuropathy: Secondary | ICD-10-CM | POA: Diagnosis not present

## 2018-12-12 DIAGNOSIS — Z8673 Personal history of transient ischemic attack (TIA), and cerebral infarction without residual deficits: Secondary | ICD-10-CM | POA: Diagnosis not present

## 2018-12-12 DIAGNOSIS — M48061 Spinal stenosis, lumbar region without neurogenic claudication: Secondary | ICD-10-CM | POA: Diagnosis not present

## 2018-12-12 DIAGNOSIS — I110 Hypertensive heart disease with heart failure: Secondary | ICD-10-CM | POA: Diagnosis not present

## 2018-12-12 DIAGNOSIS — E1165 Type 2 diabetes mellitus with hyperglycemia: Secondary | ICD-10-CM | POA: Diagnosis not present

## 2018-12-13 ENCOUNTER — Other Ambulatory Visit: Payer: Self-pay

## 2018-12-13 ENCOUNTER — Encounter: Payer: Self-pay | Admitting: Podiatry

## 2018-12-13 ENCOUNTER — Ambulatory Visit (INDEPENDENT_AMBULATORY_CARE_PROVIDER_SITE_OTHER): Payer: Medicare Other | Admitting: Podiatry

## 2018-12-13 VITALS — Temp 97.2°F

## 2018-12-13 DIAGNOSIS — B351 Tinea unguium: Secondary | ICD-10-CM

## 2018-12-13 DIAGNOSIS — M79674 Pain in right toe(s): Secondary | ICD-10-CM

## 2018-12-13 DIAGNOSIS — M79675 Pain in left toe(s): Secondary | ICD-10-CM

## 2018-12-13 DIAGNOSIS — E1142 Type 2 diabetes mellitus with diabetic polyneuropathy: Secondary | ICD-10-CM | POA: Diagnosis not present

## 2018-12-13 DIAGNOSIS — L84 Corns and callosities: Secondary | ICD-10-CM

## 2018-12-13 NOTE — Patient Instructions (Addendum)

## 2018-12-16 DIAGNOSIS — E1142 Type 2 diabetes mellitus with diabetic polyneuropathy: Secondary | ICD-10-CM | POA: Diagnosis not present

## 2018-12-16 DIAGNOSIS — M48061 Spinal stenosis, lumbar region without neurogenic claudication: Secondary | ICD-10-CM | POA: Diagnosis not present

## 2018-12-16 DIAGNOSIS — I5030 Unspecified diastolic (congestive) heart failure: Secondary | ICD-10-CM | POA: Diagnosis not present

## 2018-12-16 DIAGNOSIS — Z7984 Long term (current) use of oral hypoglycemic drugs: Secondary | ICD-10-CM | POA: Diagnosis not present

## 2018-12-16 DIAGNOSIS — E1165 Type 2 diabetes mellitus with hyperglycemia: Secondary | ICD-10-CM | POA: Diagnosis not present

## 2018-12-16 DIAGNOSIS — I110 Hypertensive heart disease with heart failure: Secondary | ICD-10-CM | POA: Diagnosis not present

## 2018-12-16 DIAGNOSIS — Z87891 Personal history of nicotine dependence: Secondary | ICD-10-CM | POA: Diagnosis not present

## 2018-12-16 DIAGNOSIS — Z8673 Personal history of transient ischemic attack (TIA), and cerebral infarction without residual deficits: Secondary | ICD-10-CM | POA: Diagnosis not present

## 2018-12-18 DIAGNOSIS — Z8673 Personal history of transient ischemic attack (TIA), and cerebral infarction without residual deficits: Secondary | ICD-10-CM | POA: Diagnosis not present

## 2018-12-18 DIAGNOSIS — E1165 Type 2 diabetes mellitus with hyperglycemia: Secondary | ICD-10-CM | POA: Diagnosis not present

## 2018-12-18 DIAGNOSIS — I5030 Unspecified diastolic (congestive) heart failure: Secondary | ICD-10-CM | POA: Diagnosis not present

## 2018-12-18 DIAGNOSIS — I110 Hypertensive heart disease with heart failure: Secondary | ICD-10-CM | POA: Diagnosis not present

## 2018-12-18 DIAGNOSIS — E1142 Type 2 diabetes mellitus with diabetic polyneuropathy: Secondary | ICD-10-CM | POA: Diagnosis not present

## 2018-12-18 DIAGNOSIS — R310 Gross hematuria: Secondary | ICD-10-CM | POA: Diagnosis not present

## 2018-12-18 DIAGNOSIS — R3912 Poor urinary stream: Secondary | ICD-10-CM | POA: Diagnosis not present

## 2018-12-18 DIAGNOSIS — N401 Enlarged prostate with lower urinary tract symptoms: Secondary | ICD-10-CM | POA: Diagnosis not present

## 2018-12-18 DIAGNOSIS — M48061 Spinal stenosis, lumbar region without neurogenic claudication: Secondary | ICD-10-CM | POA: Diagnosis not present

## 2018-12-22 NOTE — Progress Notes (Signed)
Subjective: [name] presents with diabetes, diabetic neuropathy and cc of painful, discolored, thick toenails and chronic callus left hallux which interfere with activities of daily living. Pain is aggravated when wearing enclosed shoe gear. Pain is relieved with periodic professional debridement.  Ladell Pier, MD is his PCP. Last visit was 09/24/2018.   Current Outpatient Medications:  .  atorvastatin (LIPITOR) 40 MG tablet, Take 1 tablet (40 mg total) by mouth daily., Disp: 30 tablet, Rfl: 6 .  betamethasone dipropionate 0.05 % lotion, , Disp: , Rfl:  .  Blood Glucose Monitoring Suppl (TRUE METRIX METER) w/Device KIT, Use as directed, Disp: 1 kit, Rfl: 0 .  cetirizine (ZYRTEC) 10 MG tablet, Take 1 tablet (10 mg total) by mouth daily., Disp: 30 tablet, Rfl: 11 .  furosemide (LASIX) 40 MG tablet, 1 tab PO daily.  Take an extra one for two days PRN for increase lower extremity swelling, Disp: 40 tablet, Rfl: 6 .  gabapentin (NEURONTIN) 300 MG capsule, TAKE 1 CAPSULE (300 MG TOTAL) BY MOUTH THREE TIMES DAILY., Disp: 270 capsule, Rfl: 3 .  glipiZIDE (GLUCOTROL XL) 10 MG 24 hr tablet, TAKE ONE TABLET BY MOUTH DAILY WITH BREAKFAST, Disp: 30 tablet, Rfl: 2 .  Glucose Blood (TRUE METRIX BLOOD GLUCOSE TEST VI), USE AS DIRECTED, Disp: , Rfl: 0 .  glucose blood (TRUE METRIX BLOOD GLUCOSE TEST) test strip, Check blood sugars 1-2 times daily, Disp: 100 each, Rfl: 12 .  glucose blood test strip, CHECK BLOOD SUGARS 1-2 TIMES DAILY, Disp: , Rfl: 12 .  hydrALAZINE (APRESOLINE) 10 MG tablet, Take 1 tablet (10 mg total) by mouth 2 (two) times daily., Disp: 60 tablet, Rfl: 6 .  labetalol (NORMODYNE) 100 MG tablet, Take 1 tablet (100 mg total) by mouth 2 (two) times daily., Disp: 30 tablet, Rfl: 6 .  Lancets Misc. (ACCU-CHEK SOFTCLIX LANCET DEV) KIT, Use as directed, Disp: 1 kit, Rfl: 0 .  lisinopril (PRINIVIL,ZESTRIL) 40 MG tablet, TAKE 1 TABLET (40 MG TOTAL) BY MOUTH DAILY., Disp: 90 tablet, Rfl:  0 .  metFORMIN (GLUCOPHAGE) 1000 MG tablet, TAKE ONE TABLET BY MOUTH TWICE A DAY WITH MEALS, Disp: 60 tablet, Rfl: 2 .  triamcinolone cream (KENALOG) 0.1 %, APPLY 1 APPLICATION TOPICALLY TWO (TWO) TIMES DAILY., Disp: 45 g, Rfl: 0 .  TRUEPLUS LANCETS 28G MISC, Use as directed, Disp: 100 each, Rfl: 6  Allergies  Allergen Reactions  . Amlodipine Swelling    Lower extremity swelling    Objective: Vitals:   12/13/18 1247  Temp: (!) 97.2 F (36.2 C)    Vascular Examination: Capillary refill time <3 seconds x 10 digits.  Dorsalis pedis pulses faintly palpable b/l.  Posterior tibial pulses faintly palpable b/l.  Digital hair absent x 10 digits.  Skin temperature gradient WNL b/l.  Dermatological Examination: Skin with normal turgor, texture and tone b/l.  Toenails 1-5 b/l discolored, thick, dystrophic with subungual debris and pain with palpation to nailbeds due to thickness of nails.  Hyperkeratotic lesion(s) plantar left hallux with subdermal hemorrhage and submetatarsal head 4 left foot. No erythema, no edema, no drainage, no flocculence noted.   Musculoskeletal: Muscle strength 5/5 to all LE muscle groups.  HAV with bunion deformity b/l  Hammertoe deformity 2-5 b/l.  No pain, crepitus or joint limitation with passive/active ROM.  Neurological: Sensation diminished with 10 gram monofilament.  Vibratory sensation diminished.  Assessment: 1. Painful onychomycosis toenails 1-5 b/l 2. Preulcerative callus left hallux 3. Callus submet head 4 left foot 4.  NIDDM with Diabetic neuropathy  Plan: 1. Continue diabetic foot care principles. Literature dispensed on today. 2. Toenails 1-5 b/l were debrided in length and girth without iatrogenic bleeding. 3. Calluses pared submetatarsal head(s) 4 left foot utilizing sterile scalpel blade without incident.  4. Preulcerative callus pared subhallux left foot utilizing sterile scalpel blade without incident.  5. Patient to  continue soft, supportive shoe gear daily. Will start diabetic shoe process for him since he has preulcerative callus of left hallux. Per Medicare guidelines, patient's feet need to be evaluated by an MD/DO managing patient's diabetes and diabetic shoe certification form needs to be signed by the MD/DO. If patient's diabetes is being managed by an Endocrinologist, the Endocrinologist must evaluate patient's feet and sign the Medicare diabetic shoe certification form. 6. Patient to report any pedal injuries to medical professional  7. Follow up 10 weeks.  8. Patient/POA to call should there be a concern in the interim.

## 2018-12-24 DIAGNOSIS — E1142 Type 2 diabetes mellitus with diabetic polyneuropathy: Secondary | ICD-10-CM | POA: Diagnosis not present

## 2018-12-24 DIAGNOSIS — Z8673 Personal history of transient ischemic attack (TIA), and cerebral infarction without residual deficits: Secondary | ICD-10-CM | POA: Diagnosis not present

## 2018-12-24 DIAGNOSIS — I5030 Unspecified diastolic (congestive) heart failure: Secondary | ICD-10-CM | POA: Diagnosis not present

## 2018-12-24 DIAGNOSIS — E1165 Type 2 diabetes mellitus with hyperglycemia: Secondary | ICD-10-CM | POA: Diagnosis not present

## 2018-12-24 DIAGNOSIS — I110 Hypertensive heart disease with heart failure: Secondary | ICD-10-CM | POA: Diagnosis not present

## 2018-12-24 DIAGNOSIS — M48061 Spinal stenosis, lumbar region without neurogenic claudication: Secondary | ICD-10-CM | POA: Diagnosis not present

## 2018-12-30 ENCOUNTER — Other Ambulatory Visit: Payer: Self-pay | Admitting: Internal Medicine

## 2018-12-30 DIAGNOSIS — I10 Essential (primary) hypertension: Secondary | ICD-10-CM

## 2019-01-02 ENCOUNTER — Ambulatory Visit: Payer: Medicare Other | Attending: Internal Medicine | Admitting: Internal Medicine

## 2019-01-02 ENCOUNTER — Other Ambulatory Visit: Payer: Self-pay

## 2019-01-02 ENCOUNTER — Encounter: Payer: Self-pay | Admitting: Internal Medicine

## 2019-01-02 DIAGNOSIS — R531 Weakness: Secondary | ICD-10-CM

## 2019-01-02 DIAGNOSIS — I10 Essential (primary) hypertension: Secondary | ICD-10-CM | POA: Diagnosis not present

## 2019-01-02 DIAGNOSIS — E1142 Type 2 diabetes mellitus with diabetic polyneuropathy: Secondary | ICD-10-CM

## 2019-01-02 DIAGNOSIS — L84 Corns and callosities: Secondary | ICD-10-CM | POA: Diagnosis not present

## 2019-01-02 DIAGNOSIS — IMO0002 Reserved for concepts with insufficient information to code with codable children: Secondary | ICD-10-CM

## 2019-01-02 DIAGNOSIS — E1165 Type 2 diabetes mellitus with hyperglycemia: Secondary | ICD-10-CM

## 2019-01-02 DIAGNOSIS — I5030 Unspecified diastolic (congestive) heart failure: Secondary | ICD-10-CM

## 2019-01-02 MED ORDER — GABAPENTIN 100 MG PO CAPS: 200.0000 mg | ORAL_CAPSULE | Freq: Two times a day (BID) | ORAL | 6 refills | Status: DC

## 2019-01-02 MED ORDER — ACETAMINOPHEN 500 MG PO TABS: 500.0000 mg | ORAL_TABLET | Freq: Two times a day (BID) | ORAL | 2 refills | Status: DC | PRN

## 2019-01-02 NOTE — Progress Notes (Signed)
Virtual Visit via Telephone Note Due to current restrictions/limitations of in-office visits due to the COVID-19 pandemic, this scheduled clinical appointment was converted to a telehealth visit  I connected with [name] on 01/02/19 at 8:45 a.m by telephone and verified that I am speaking with the correct person using two identifiers. I am in my office.  The patient is at home.  Only the patient and myself participated in this encounter.  I discussed the limitations, risks, security and privacy concerns of performing an evaluation and management service by telephone and the availability of in person appointments. I also discussed with the patient that there may be a patient responsible charge related to this service. The patient expressed understanding and agreed to proceed.   History of Present Illness: Pt with history ofDM2 with retinopathy and neuropathy, HTN, dCHF, formertob dep, HL, CVA (about 3 yrs ago per pt), spinal stenosis of lumbar spine,chronic lower extremity edema.  Last seen 09/24/2018   C/o pain in LL leg from foot all the way up to the knee that started this morning. Prior to this it would come and go.  No pain in lower back -no redness or increase swelling in the leg. -feels "like a whole lot of needles sticking me."   -home P.T is helping with the tingling and weakness in this leg.  Weakness also in LT hand.  Weakness on LT side is not new.  Present for years.  No recent falls. Gabapentin helps but makes him sleepy when he takes it so only takes BID rather than TID Would like to have home P.T extended for another 2-3 wks.  Next session 01/06/2019.  Initially 2 x a wk but now once a wk.  They recommended that he use his cane when walking around the house.  DM:  Checks BS once a wk. BS range 119-150.  Last A1c in March of this year was 7.5 Reports compliance with Metformin and Glipizide Appetite fluctuates.  Tries to stay away from junk snacks and sodas.  No scale at  home to way himself. Needs a pair of DM shoes.  Has a pre-ulcerative callus of the left hallux.  saw podiatry Dr. Adah Perl, recently  HTN/diastolic CHF:  No device to check BP but it is checked by home physical therapist.  Limits salt in foods No CP/SOB/no inc LE edema No PND/orthopnea Reports compliance with Furosemide, Labetalol, lisinopril and hydralazine.  Saw the clinical pharmacist since last visit with me.  Blood pressure was good on that visit.  Outpatient Encounter Medications as of 01/02/2019  Medication Sig  . atorvastatin (LIPITOR) 40 MG tablet Take 1 tablet (40 mg total) by mouth daily.  . betamethasone dipropionate 0.05 % lotion   . Blood Glucose Monitoring Suppl (TRUE METRIX METER) w/Device KIT Use as directed  . cetirizine (ZYRTEC) 10 MG tablet Take 1 tablet (10 mg total) by mouth daily.  . furosemide (LASIX) 40 MG tablet 1 tab PO daily.  Take an extra one for two days PRN for increase lower extremity swelling  . gabapentin (NEURONTIN) 300 MG capsule TAKE 1 CAPSULE (300 MG TOTAL) BY MOUTH THREE TIMES DAILY.  Marland Kitchen glipiZIDE (GLUCOTROL XL) 10 MG 24 hr tablet TAKE ONE TABLET BY MOUTH DAILY WITH BREAKFAST  . Glucose Blood (TRUE METRIX BLOOD GLUCOSE TEST VI) USE AS DIRECTED  . glucose blood (TRUE METRIX BLOOD GLUCOSE TEST) test strip Check blood sugars 1-2 times daily  . glucose blood test strip CHECK BLOOD SUGARS 1-2 TIMES DAILY  .  hydrALAZINE (APRESOLINE) 10 MG tablet Take 1 tablet (10 mg total) by mouth 2 (two) times daily.  Marland Kitchen labetalol (NORMODYNE) 100 MG tablet TAKE ONE TABLET BY MOUTH TWICE A DAY  . Lancets Misc. (ACCU-CHEK SOFTCLIX LANCET DEV) KIT Use as directed  . lisinopril (ZESTRIL) 40 MG tablet TAKE ONE TABLET BY MOUTH DAILY  . metFORMIN (GLUCOPHAGE) 1000 MG tablet TAKE ONE TABLET BY MOUTH TWICE A DAY WITH MEALS  . triamcinolone cream (KENALOG) 0.1 % APPLY 1 APPLICATION TOPICALLY TWO (TWO) TIMES DAILY.  Marland Kitchen TRUEPLUS LANCETS 28G MISC Use as directed   No  facility-administered encounter medications on file as of 01/02/2019.     Observations/Objective: No direct observation done as this was a telephone encounter  Assessment and Plan: 1. Left-sided weakness -Patient reports improvement with home physical therapy.  I told him that it is okay for [name] to extend home physical therapy by 2 to 3 weeks.  He would let the therapist know when he will she comes out next week so that they can send me a new order.   2. Uncontrolled type 2 diabetes mellitus with peripheral neuropathy (HCC) Continue glipizide and metformin. Encourage healthy eating habits. Since gabapentin causes some drowsiness I recommend decreasing the dose to 200 mg twice a day.  He can take Tylenol 500 mg twice a day as needed with it  3. Essential hypertension Continue current medications and low-salt diet  4. Diastolic congestive heart failure, unspecified HF chronicity ([place]) Based on history he sounds clinically compensated  5. Pre-ulcerative calluses His podiatrist has initiated the process of getting diabetic shoes.  Usually they send me the form to sign off on.  I told patient that I will sign it once I receive it.   Patient was given the opportunity to ask questions.  Patient verbalized understanding of the plan and was able to repeat key elements of the plan.   No orders of the defined types were placed in this encounter.    Requested Prescriptions    No prescriptions requested or ordered in this encounter    Future Appointments  Date Time Provider [place]  01/02/2019  3:30 PM Ladell Pier, MD [facility] None  02/04/2019  9:45 AM Venancio Poisson, NP [facility] None  02/24/2019  8:30 AM Marzetta Board, DPM [facility]  04/14/2019 12:15 PM Hayden Pedro, MD [facility] None  04/17/2019  9:10 AM Ladell Pier, MD [facility] None    Karle Plumber, MD, FACP    Follow Up Instructions:    I discussed the assessment and treatment plan  with the patient. The patient was provided an opportunity to ask questions and all were answered. The patient agreed with the plan and demonstrated an understanding of the instructions.   The patient was advised to call back or seek an in-person evaluation if the symptoms worsen or if the condition fails to improve as anticipated.  I provided 27 minutes of non-face-to-face time during this encounter.   Karle Plumber, MD

## 2019-01-10 DIAGNOSIS — Z8673 Personal history of transient ischemic attack (TIA), and cerebral infarction without residual deficits: Secondary | ICD-10-CM | POA: Diagnosis not present

## 2019-01-10 DIAGNOSIS — I5030 Unspecified diastolic (congestive) heart failure: Secondary | ICD-10-CM | POA: Diagnosis not present

## 2019-01-10 DIAGNOSIS — E1165 Type 2 diabetes mellitus with hyperglycemia: Secondary | ICD-10-CM | POA: Diagnosis not present

## 2019-01-10 DIAGNOSIS — M48061 Spinal stenosis, lumbar region without neurogenic claudication: Secondary | ICD-10-CM | POA: Diagnosis not present

## 2019-01-10 DIAGNOSIS — E1142 Type 2 diabetes mellitus with diabetic polyneuropathy: Secondary | ICD-10-CM | POA: Diagnosis not present

## 2019-01-10 DIAGNOSIS — I110 Hypertensive heart disease with heart failure: Secondary | ICD-10-CM | POA: Diagnosis not present

## 2019-01-14 ENCOUNTER — Other Ambulatory Visit: Payer: Self-pay

## 2019-01-14 ENCOUNTER — Ambulatory Visit: Payer: Medicare Other | Admitting: Orthotics

## 2019-01-14 DIAGNOSIS — L84 Corns and callosities: Secondary | ICD-10-CM

## 2019-01-14 DIAGNOSIS — E1142 Type 2 diabetes mellitus with diabetic polyneuropathy: Secondary | ICD-10-CM

## 2019-01-14 NOTE — Progress Notes (Signed)

## 2019-01-15 DIAGNOSIS — E1142 Type 2 diabetes mellitus with diabetic polyneuropathy: Secondary | ICD-10-CM | POA: Diagnosis not present

## 2019-01-15 DIAGNOSIS — I5030 Unspecified diastolic (congestive) heart failure: Secondary | ICD-10-CM | POA: Diagnosis not present

## 2019-01-15 DIAGNOSIS — M48061 Spinal stenosis, lumbar region without neurogenic claudication: Secondary | ICD-10-CM | POA: Diagnosis not present

## 2019-01-15 DIAGNOSIS — I110 Hypertensive heart disease with heart failure: Secondary | ICD-10-CM | POA: Diagnosis not present

## 2019-01-15 DIAGNOSIS — Z87891 Personal history of nicotine dependence: Secondary | ICD-10-CM | POA: Diagnosis not present

## 2019-01-15 DIAGNOSIS — E1165 Type 2 diabetes mellitus with hyperglycemia: Secondary | ICD-10-CM | POA: Diagnosis not present

## 2019-01-15 DIAGNOSIS — Z8673 Personal history of transient ischemic attack (TIA), and cerebral infarction without residual deficits: Secondary | ICD-10-CM | POA: Diagnosis not present

## 2019-01-15 DIAGNOSIS — Z7984 Long term (current) use of oral hypoglycemic drugs: Secondary | ICD-10-CM | POA: Diagnosis not present

## 2019-01-17 ENCOUNTER — Telehealth: Payer: Self-pay | Admitting: Internal Medicine

## 2019-01-17 NOTE — Telephone Encounter (Signed)
Patient called to see if the information had been sent in about therapy please fu with him about [facility]

## 2019-01-20 DIAGNOSIS — I5030 Unspecified diastolic (congestive) heart failure: Secondary | ICD-10-CM | POA: Diagnosis not present

## 2019-01-20 DIAGNOSIS — E1165 Type 2 diabetes mellitus with hyperglycemia: Secondary | ICD-10-CM | POA: Diagnosis not present

## 2019-01-20 DIAGNOSIS — Z8673 Personal history of transient ischemic attack (TIA), and cerebral infarction without residual deficits: Secondary | ICD-10-CM | POA: Diagnosis not present

## 2019-01-20 DIAGNOSIS — M48061 Spinal stenosis, lumbar region without neurogenic claudication: Secondary | ICD-10-CM | POA: Diagnosis not present

## 2019-01-20 DIAGNOSIS — E1142 Type 2 diabetes mellitus with diabetic polyneuropathy: Secondary | ICD-10-CM | POA: Diagnosis not present

## 2019-01-20 DIAGNOSIS — I110 Hypertensive heart disease with heart failure: Secondary | ICD-10-CM | POA: Diagnosis not present

## 2019-01-21 NOTE — Telephone Encounter (Signed)
Patient is aware of MA faxing this request on 01/16/2019 to [facility] and a transmission log was retrieved.

## 2019-01-22 DIAGNOSIS — I110 Hypertensive heart disease with heart failure: Secondary | ICD-10-CM | POA: Diagnosis not present

## 2019-01-22 DIAGNOSIS — I5030 Unspecified diastolic (congestive) heart failure: Secondary | ICD-10-CM | POA: Diagnosis not present

## 2019-01-22 DIAGNOSIS — Z8673 Personal history of transient ischemic attack (TIA), and cerebral infarction without residual deficits: Secondary | ICD-10-CM | POA: Diagnosis not present

## 2019-01-22 DIAGNOSIS — M48061 Spinal stenosis, lumbar region without neurogenic claudication: Secondary | ICD-10-CM | POA: Diagnosis not present

## 2019-01-22 DIAGNOSIS — E1142 Type 2 diabetes mellitus with diabetic polyneuropathy: Secondary | ICD-10-CM | POA: Diagnosis not present

## 2019-01-22 DIAGNOSIS — E1165 Type 2 diabetes mellitus with hyperglycemia: Secondary | ICD-10-CM | POA: Diagnosis not present

## 2019-01-27 ENCOUNTER — Telehealth: Payer: Self-pay | Admitting: *Deleted

## 2019-01-27 ENCOUNTER — Other Ambulatory Visit: Payer: Self-pay

## 2019-01-27 ENCOUNTER — Ambulatory Visit: Payer: Medicare Other | Attending: Internal Medicine | Admitting: Internal Medicine

## 2019-01-27 ENCOUNTER — Encounter: Payer: Self-pay | Admitting: Internal Medicine

## 2019-01-27 ENCOUNTER — Telehealth: Payer: Self-pay | Admitting: Internal Medicine

## 2019-01-27 DIAGNOSIS — E1165 Type 2 diabetes mellitus with hyperglycemia: Secondary | ICD-10-CM | POA: Diagnosis not present

## 2019-01-27 DIAGNOSIS — J309 Allergic rhinitis, unspecified: Secondary | ICD-10-CM | POA: Diagnosis not present

## 2019-01-27 DIAGNOSIS — E1142 Type 2 diabetes mellitus with diabetic polyneuropathy: Secondary | ICD-10-CM | POA: Diagnosis not present

## 2019-01-27 DIAGNOSIS — Z8673 Personal history of transient ischemic attack (TIA), and cerebral infarction without residual deficits: Secondary | ICD-10-CM | POA: Diagnosis not present

## 2019-01-27 DIAGNOSIS — I5030 Unspecified diastolic (congestive) heart failure: Secondary | ICD-10-CM | POA: Diagnosis not present

## 2019-01-27 DIAGNOSIS — I110 Hypertensive heart disease with heart failure: Secondary | ICD-10-CM | POA: Diagnosis not present

## 2019-01-27 DIAGNOSIS — M48061 Spinal stenosis, lumbar region without neurogenic claudication: Secondary | ICD-10-CM | POA: Diagnosis not present

## 2019-01-27 MED ORDER — LORATADINE 10 MG PO TABS: 10.0000 mg | ORAL_TABLET | Freq: Every day | ORAL | 3 refills | Status: DC | PRN

## 2019-01-27 NOTE — Progress Notes (Signed)
Virtual Visit via Telephone Note Due to current restrictions/limitations of in-office visits due to the COVID-19 pandemic, this scheduled clinical appointment was converted to a telehealth visit  I connected with [name] on 01/27/19 at 10:29 a.m by telephone and verified that I am speaking with the correct person using two identifiers. I am in my office.  The patient is at home.  Only the patient and myself participated in this encounter.  I discussed the limitations, risks, security and privacy concerns of performing an evaluation and management service by telephone and the availability of in person appointments. I also discussed with the patient that there may be a patient responsible charge related to this service. The patient expressed understanding and agreed to proceed.   History of Present Illness: Pt with history ofDM2 with retinopathy and neuropathy, HTN, dCHF, formertob dep, HL, CVA (about 3 yrs ago per pt), spinal stenosis of lumbar spine,chronic lower extremity edema.   I last did tele-visit   Patient requested telemetry visit today over concerns that he may have COVID. He reports symptoms of sneezing, lacrimation, itchy nose and occasional dry cough over the past few days.  He has not had any exposure to anyone with known COVID.  He lives alone and goes out only when necessary to buy groceries or conduct other business.  He wears mask when out in public. -He denies any fever, shortness of breath, vomiting or diarrhea, sore throat, loss of sense of smell or taste. Observations/Objective: No direct observation done as this was a tele visit  Assessment and Plan: 1. Allergic rhinitis, unspecified seasonality, unspecified trigger Advised patient that symptoms are most consistent with allergic rhinitis.  I recommend a trial of Claritin.  He will let me know if symptoms do not improve.  Patient asked whether his physical therapist can continue to, I told him yes - loratadine  (CLARITIN) 10 MG tablet; Take 1 tablet (10 mg total) by mouth daily as needed for allergies.  Dispense: 30 tablet; Refill: 3  2.  COVID education Discussed signs and symptoms that may suggest COVID.  Encourage patient to continue social distancing, wearing a mask when in public and good handwashing.  Follow Up Instructions: PRN and as previously scheduled   I discussed the assessment and treatment plan with the patient. The patient was provided an opportunity to ask questions and all were answered. The patient agreed with the plan and demonstrated an understanding of the instructions.   The patient was advised to call back or seek an in-person evaluation if the symptoms worsen or if the condition fails to improve as anticipated.  I provided 6 minutes of non-face-to-face time during this encounter.   Karle Plumber, MD

## 2019-01-27 NOTE — Telephone Encounter (Signed)
[name] with [facility] called stating that the patient is concerned and would like to get tested for covid but would like to know what advise could be given for the patient. Please follow up.[name] nose Chills on/off Possible covid contact

## 2019-01-27 NOTE — Progress Notes (Signed)
Patient verified DOB Patient has taken medication today. Patient has eaten today. Patient complains of cough and sneezing with runny eyes. Patient denies fever. Patient denies N/V and diarrhea. Patient denies any exposure. Patient complain of symptoms increased over the weekend. Patient complains of sharp pain on the left side of head that is intermittent. CBG:206 after eating

## 2019-01-27 NOTE — Telephone Encounter (Signed)
Pt Physical Therapist called to inform that the patient was having sx's of COVID-19 and was concerned he needed to have testing. She states his eyes was watery  And patient was sniffing, malaise, sneezing, chills and dry cough.  VS- T: 96.2( A) R: 17 BP- 140/60  HR- 76 CBG- 206 non fasting  Pt wanted to go to ED, he does not have own transportation.  Pt was added to the schedule for his PCP as a televisit.

## 2019-01-29 DIAGNOSIS — E1165 Type 2 diabetes mellitus with hyperglycemia: Secondary | ICD-10-CM | POA: Diagnosis not present

## 2019-01-29 DIAGNOSIS — I5030 Unspecified diastolic (congestive) heart failure: Secondary | ICD-10-CM | POA: Diagnosis not present

## 2019-01-29 DIAGNOSIS — E1142 Type 2 diabetes mellitus with diabetic polyneuropathy: Secondary | ICD-10-CM | POA: Diagnosis not present

## 2019-01-29 DIAGNOSIS — Z8673 Personal history of transient ischemic attack (TIA), and cerebral infarction without residual deficits: Secondary | ICD-10-CM | POA: Diagnosis not present

## 2019-01-29 DIAGNOSIS — I110 Hypertensive heart disease with heart failure: Secondary | ICD-10-CM | POA: Diagnosis not present

## 2019-01-29 DIAGNOSIS — M48061 Spinal stenosis, lumbar region without neurogenic claudication: Secondary | ICD-10-CM | POA: Diagnosis not present

## 2019-02-04 ENCOUNTER — Other Ambulatory Visit: Payer: Self-pay

## 2019-02-04 ENCOUNTER — Ambulatory Visit (INDEPENDENT_AMBULATORY_CARE_PROVIDER_SITE_OTHER): Payer: Medicare Other | Admitting: Adult Health

## 2019-02-04 ENCOUNTER — Encounter: Payer: Self-pay | Admitting: Adult Health

## 2019-02-04 VITALS — BP 118/67 | HR 71 | Temp 97.3°F | Ht 75.0 in | Wt 278.2 lb

## 2019-02-04 DIAGNOSIS — I10 Essential (primary) hypertension: Secondary | ICD-10-CM

## 2019-02-04 DIAGNOSIS — R202 Paresthesia of skin: Secondary | ICD-10-CM

## 2019-02-04 DIAGNOSIS — G459 Transient cerebral ischemic attack, unspecified: Secondary | ICD-10-CM | POA: Diagnosis not present

## 2019-02-04 DIAGNOSIS — R208 Other disturbances of skin sensation: Secondary | ICD-10-CM | POA: Diagnosis not present

## 2019-02-04 DIAGNOSIS — E785 Hyperlipidemia, unspecified: Secondary | ICD-10-CM

## 2019-02-04 DIAGNOSIS — E118 Type 2 diabetes mellitus with unspecified complications: Secondary | ICD-10-CM | POA: Diagnosis not present

## 2019-02-04 NOTE — Progress Notes (Signed)
[facility] [address] [place]. [place] [address] [phone]       OFFICE FOLLOW UP NOTE  [name] Date of Birth:  [birth year] Medical Record Number:  [id number]   Reason for Referral:  hospital stroke follow up  CHIEF COMPLAINT:  Chief Complaint  Patient presents with   Follow-up    9 mon f/u. Alone. Treatment room. No new concerns at this time.     HPI:   02/04/19 VISIT  [name] is being seen today for follow up regarding TIA vs conversion disorder on 03/04/18.  Stable from stroke standpoint without new or recurring stroke/TIA symptoms.  He continues on atorvastatin 40 mg daily but at some point discontinued aspirin 81 mg as he states he was told by his PCP he no longer needed this medication for undetermined reasons.  He denies any complication with bleeding or bruising.  Blood pressure satisfactory at 118/67. He has been followed by his PCP for complaints of LLE pain consisting of numbness/tingling over the past 2 months.  He previously experienced intermittent neuropathy but has now been experiencing this constantly which is worse distally when standing but will radiate through the back of his calf and thigh.  Denies back or hip pain.  PCP initiated gabapentin twice daily along with home physical therapy. He does endorse improvement with use of home PT when she has been participating in over the past month. 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  Denies any worsening weakness.  He continues to use a cane for ambulation.  Denies any recent falls.   No further concerns at this time.     Initial visit 04/26/18: [name] is a 58 y.o. male with history of HTN, HLD, DB, obesity, CAD/STEMI, CHF, alcohol use, cocaine abuse, CKD, congenital hydrocephalus, traumatic spinal cord contusion in 12/2013 with resultant L HP (no documented stroke) who presented to [facility] on 03/04/2018 with R sided weakness.  CT had  reviewed and was negative for acute abnormalities but did show evidence of small vessel disease.  MRI head reviewed and was negative for acute stroke.  MRA was negative for emergent LVO but did show some narrowing in the left ICA which was unchanged from prior imaging in 2016.  Carotid Doppler showed bilateral ICA stenosis of 1 to 39% with VAs antegrade.  2D echo showed an EF of 65 to 70% without evidence of cardiac source of embolus.  LDL 98 and recommended increase from Lipitor 40 mg to Lipitor 80 mg daily.  HTN stable during admission and recommended long-term BP goal normotensive range.  A1c 7.0 and recommended continue follow-up with PCP for DM management.  Current smoker and recommended smoking cessation with counseling provided during admission.  Per notes, current condition concern for TIA versus conversion disorder as MRI negative and previous diagnosis of conversion disorder.  Patient was not compliant with aspirin 81 mg PTA and recommended to restart aspirin 81 mg daily with importance of compliance.  Patient was discharged home in stable condition with recommendations of home health PT/OT.  Patient is being seen today for hospital follow-up.  He has since completed home therapies and has resumed back to all prior activities.  He states all prior right-sided weakness has resolved but continues to have some left hemiparesis which is chronic.  He states compliance with aspirin 81 mg and denies any bleeding or bruising.  Continues to take increased dose of Lipitor without side effects myalgias.  Blood pressure today satisfactory 129/74.  Does monitor glucose levels at home and typically range from 100-1 50.  He does live at home independently but has an aide come in daily to assist with ADLs and IADLs.  He uses a cane for ambulation due to chronic left hemiparesis.  Denies current tobacco use or recreational drug use.  Denies new or worsening stroke/TIA symptoms. On a side note, patient has complaints of  bilateral upper extremity and back of head "rash" which she states has been present for approximately 2 months and was seen by his PCP in regards to this and per patient, was told that it was a "summer rash".  He does endorse pruritus without improvement.    ROS:   14 system review of systems performed and negative with exception of numbness/tingling, weakness and pain  PMH:  Past Medical History:  Diagnosis Date   Alcohol abuse    11-15-2017  per pt last alcohol Dec 2018   BPH with obstruction/lower urinary tract symptoms    Chronic arm pain    Chronic pain    arms, leg, back   CKD (chronic kidney disease), stage II    Cocaine abuse ([place])    11-15-2017  per pt last used March 2019   Congenital hydrocephalus, unspecified ([place])    slow progression   Diabetic retinopathy of both eyes (HCC)    Diastolic CHF, chronic ([place]) 07/2017   Gait instability    multifactorial -- slow worsening congenital hydrocephalus, peripheral neuropathy, left C5-6 cord lesion   Hematuria    History of acute pulmonary edema 07/2017   History of spinal cord injury 01/02/2014   pt fell, caused spinal cord contusion at C5-6--  residual left side weakness     History of ST elevation myocardial infarction (STEMI) 12/26/2013   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   History of TIA (transient ischemic attack) and stroke    hx cva 12-20-2010 and  TIA 02-22-2011--- no residual's from cva or tia   Hyperlipidemia    Hypertension    followed by pcp   Left-sided weakness 12/2013   chronic due to spinal cord contusion   Lesion of bladder    Peripheral edema    chronic LUE   Peripheral neuropathy    Type 2 diabetes mellitus ([place])    followed by dr Karle Plumber--  last A1c 6.8 on 11-06-2017   Weak urinary stream     PSH:  Past Surgical History:  Procedure Laterality Date   CARDIAC CATHETERIZATION  12/01/2008   dr hochrein   abnormal stress myoview:  mild coronary plaque, normal LVF     CARDIAC CATHETERIZATION  04/29/2011   dr Martinique   in setting ECG with new ST elevation & severe hypertensive:  nonobstructive atherosclerotic CAD, normal LVF (30% mRCA)    CARDIOVASCULAR STRESS TEST  08/15/2010   Low nuclear study w/ no evidence ishemia/  ef 42% with lateral and apical hypokinesis   CYSTOSCOPY WITH BIOPSY N/A 11/20/2017   Procedure: CYSTOSCOPY WITH BIOPSY AND FULGURATION;  Surgeon: [name], MD;  Location: [place];  Service: Urology;  Laterality: N/A;   INCISION AND DRAINAGE RIGHT DISTAL MEDIAL THIGH HEMATOMA  04-14-2005   dr Ninfa Linden   left arm skin graft  1976   injury   LEFT HEART CATHETERIZATION WITH CORONARY ANGIOGRAM Bilateral 12/26/2013   Procedure: LEFT HEART CATHETERIZATION WITH CORONARY ANGIOGRAM;  Surgeon: Burnell Blanks, MD;  Location: [facility] CATH LAB;  Service: Cardiovascular;  Laterality: Bilateral;  STEMI, in setting cocaine/ alcohol :  mid disease in the RCA (20%), moderate disease in the small caliber intermediate branch (distal 50-60%), normal LVSF (ef 55-60%)   TOOTH EXTRACTION N/A 05/16/2018   Procedure: DENTAL RESTORATION/EXTRACTIONS WITH ALVEO;  Surgeon: Diona Browner, DDS;  Location: [facility];  Service: Oral Surgery;  Laterality: N/A;   TRANSTHORACIC ECHOCARDIOGRAM  08/05/2017   moderate LVH,  ef 55%,  grade 1 diastolic dysfunction/  mild LAE/  trivial Tr    Social History:  Social History   Socioeconomic History   Marital status: Divorced    Spouse name: Not on file   Number of children: 0   Years of education: 10   Highest education level: Not on file  Occupational History   Occupation: Unemployed  Scientist, product/process development strain: Not on file   Food insecurity    Worry: Not on file    Inability: Not on file   Transportation needs    Medical: Not on file    Non-medical: Not on file  Tobacco Use   Smoking status: Former Smoker    Years: 45.00    Types: Cigarettes     Quit date: 03/03/2018    Years since quitting: 0.9   Smokeless tobacco: Never Used   Tobacco comment: 11-15-2017  per pt 1pp3days  Substance and Sexual Activity   Alcohol use: Not Currently    Alcohol/week: 0.0 standard drinks    Comment: 11-15-2017  per pt last alcohol 12/ 2018  alcohol abuse   Drug use: Not Currently    Types: "Crack" cocaine, Cocaine    Comment: 11-15-2017  per pt last used March 2019   Sexual activity: Not Currently  Lifestyle   Physical activity    Days per week: Not on file    Minutes per session: Not on file   Stress: Not on file  Relationships   Social connections    Talks on phone: Not on file    Gets together: Not on file    Attends religious service: Not on file    Active member of club or organization: Not on file    Attends meetings of clubs or organizations: Not on file    Relationship status: Not on file   Intimate partner violence    Fear of current or ex partner: Not on file    Emotionally abused: Not on file    Physically abused: Not on file    Forced sexual activity: Not on file  Other Topics Concern   Not on file  Social History Narrative   Lives at home alone.   Right-handed.   Occasional use.    Family History:  Family History  Problem Relation Age of Onset   Hypertension Mother    Diabetes Mother    Cancer Mother        breast cancer    Hypertension Father    Heart disease Father    Stroke Father    Hypertension Sister     Medications:   Current Outpatient Medications on File Prior to Visit  Medication Sig Dispense Refill   acetaminophen (TYLENOL) 500 MG tablet Take 1 tablet (500 mg total) by mouth 2 (two) times daily as needed. 60 tablet 2   atorvastatin (LIPITOR) 40 MG tablet Take 1 tablet (40 mg total) by mouth daily. 30 tablet 6   betamethasone dipropionate 0.05 % lotion      Blood Glucose Monitoring Suppl (TRUE METRIX METER) w/Device KIT Use as directed  1 kit 0   furosemide (LASIX) 40 MG tablet  1 tab PO daily.  Take an extra one for two days PRN for increase lower extremity swelling 40 tablet 6   gabapentin (NEURONTIN) 100 MG capsule Take 2 capsules (200 mg total) by mouth 2 (two) times daily. 120 capsule 6   glipiZIDE (GLUCOTROL XL) 10 MG 24 hr tablet TAKE ONE TABLET BY MOUTH DAILY WITH BREAKFAST 30 tablet 2   Glucose Blood (TRUE METRIX BLOOD GLUCOSE TEST VI) USE AS DIRECTED  0   glucose blood (TRUE METRIX BLOOD GLUCOSE TEST) test strip Check blood sugars 1-2 times daily 100 each 12   glucose blood test strip CHECK BLOOD SUGARS 1-2 TIMES DAILY  12   hydrALAZINE (APRESOLINE) 10 MG tablet Take 1 tablet (10 mg total) by mouth 2 (two) times daily. 60 tablet 6   labetalol (NORMODYNE) 100 MG tablet TAKE ONE TABLET BY MOUTH TWICE A DAY 180 tablet 0   Lancets Misc. (ACCU-CHEK SOFTCLIX LANCET DEV) KIT Use as directed 1 kit 0   lisinopril (ZESTRIL) 40 MG tablet TAKE ONE TABLET BY MOUTH DAILY 90 tablet 0   loratadine (CLARITIN) 10 MG tablet Take 1 tablet (10 mg total) by mouth daily as needed for allergies. 30 tablet 3   metFORMIN (GLUCOPHAGE) 1000 MG tablet TAKE ONE TABLET BY MOUTH TWICE A DAY WITH MEALS 60 tablet 2   triamcinolone cream (KENALOG) 0.1 % APPLY 1 APPLICATION TOPICALLY TWO (TWO) TIMES DAILY. 45 g 0   TRUEPLUS LANCETS 28G MISC Use as directed 100 each 6   No current facility-administered medications on file prior to visit.     Allergies:   Allergies  Allergen Reactions   Amlodipine Swelling    Lower extremity swelling     Physical Exam  Vitals:   02/04/19 0916  BP: 118/67  Pulse: 71  Temp: (!) 97.3 F (36.3 C)  TempSrc: Oral  Weight: 278 lb 3.2 oz (126.2 kg)  Height: '6\' 3"'$  (1.905 m)   Body mass index is 34.77 kg/m. No exam data present  General: well developed, well nourished, pleasant middle-aged African-American male, seated, in no evident distress Head: head normocephalic and atraumatic.   Neck: supple with no carotid or supraclavicular  bruits Cardiovascular: regular rate and rhythm, no murmurs Musculoskeletal: no deformity Skin:  no rash/petichiae Vascular:  Normal pulses all extremities  Neurologic Exam Mental Status: Awake and fully alert. Oriented to place and time. Recent and remote memory intact. Attention span, concentration and fund of knowledge appropriate. Mood and affect appropriate.  Cranial Nerves: Pupils equal, briskly reactive to light. Extraocular movements full without nystagmus. Visual fields full to confrontation. Hearing intact. Facial sensation intact. Face, tongue, palate moves normally and symmetrically.  Motor: Normal bulk and tone. Normal strength in all tested extremity muscles except for very mild chronic left hemiparesis Sensory.: Decreased pinprick sensation LLE and lack of vibratory sensation LLE distally.  Sensation intact RLE and bilateral upper extremities Coordination: Rapid alternating movements normal in all extremities. Finger-to-nose and heel-to-shin performed accurately bilaterally. Gait and Station: Arises from chair with mild difficulty. Stance is normal. Gait demonstrates short shuffled broad-based gait with assistance of cane Reflexes: 1+ and symmetric. Toes downgoing.       Diagnostic Data (Labs, Imaging, Testing)  CT HEAD WO CONTRAST 03/04/2018 IMPRESSION: No acute intracranial abnormalities. Mild diffuse cerebral atrophy and small vessel ischemic changes. Ventricular dilatation is likely due to central atrophy.  MR BRAIN WO CONTRAST 03/05/2018 IMPRESSION: 1. No acute intracranial abnormality.  2. Stable moderate diffuse volume loss of the brain and absent septum pellucidum.  MR MRA HEAD  03/05/2018 IMPRESSION: No emergent large vessel occlusion. Narrowing of the distal cavernous segment of the left internal carotid artery is unchanged from the CTA 04/26/2015.  VAS US CAROTID DUPLEX BILATERAL 03/05/2018 Final Interpretation: Right Carotid: Velocities in the right  ICA are consistent with a 1-39% stenosis Left Carotid: Velocities in the left ICA are consistent with a 1-39% stenosis. Vertebrals: Bilateral vertebral arteries demonstrate antegrade flow.  ECHOCARDIOGRAM 03/05/2018 Study Conclusions  - Left ventricle: The cavity size was mildly dilated. There was   mild concentric hypertrophy. Systolic function was vigorous. The   estimated ejection fraction was in the range of 65% to 70%. Wall   motion was normal; there were no regional wall motion   abnormalities. Features are consistent with a pseudonormal left   ventricular filling pattern, with concomitant abnormal relaxation   and increased filling pressure (grade 2 diastolic dysfunction). - Left atrium: The atrium was mildly dilated. - Pulmonary arteries: Systolic pressure could not be accurately   estimated.    ASSESSMENT: EDEM TIEGS is a 58 y.o. year old male here with TIA versus conversion disorder on 03/04/2018. Vascular risk factors include HTN, HLD, DM, obesity, CAD/STEMI, CHF, alcohol use, cocaine abuse, CKD, congenital hydrocephalus, and traumatic spinal cord contusion with residual left hemiparesis.  Stable from stroke standpoint.  28-monthcomplaint of constant LLE paresthesias.    PLAN: -Recommend restarting aspirin 81 mg daily and will follow-up with PCP in regards to this recommendation for secondary stroke prevention and history of CAD/STEMI and continue Lipitor 40 mg for secondary stroke prevention -F/u with PCP regarding your HLD, HTN and DM management -Obtain EMG/NCS for further evaluation of LLE symptoms especially with decreased sensation -Continue participation in home health PT along with use of gabapentin prescribed by PCP -continue to monitor BP at home -advised to continue to stay active and maintain a healthy diet -Maintain strict control of hypertension with blood pressure goal below 130/90, diabetes with hemoglobin A1c goal below 6.5% and cholesterol with LDL  cholesterol (bad cholesterol) goal below 70 mg/dL. I also advised the patient to eat a healthy diet with plenty of whole grains, cereals, fruits and vegetables, exercise regularly and maintain ideal body weight.  Follow up in 3 months or call earlier if needed   Greater than 50% of time during this 25 minute visit was spent on counseling, discussion regarding paresthesias with radiculopathy, explanation of diagnosis of TIA versus conversion disorder, reviewing risk factor management of HTN, HLD, DM, prior tobacco use and prior recreational drug use, planning of further management, discussion with patient and family and coordination of care    JVenancio Poisson AGNP-BC  [facility] [address] [address] Phone [phone] [phone]: This document was prepared with digital dictation and possible smart phrase technology. Any transcriptional errors that result from this process are unintentional.

## 2019-02-04 NOTE — Patient Instructions (Addendum)
Recommended restarting  aspirin 81 mg daily but follow up with your PCP first  continue atorvastatin for secondary stroke prevention  Continue to follow up with PCP regarding cholesterol, blood pressure and diabetes management   Continue to monitor blood pressure at home  Home therapies for back pain - continue gabapentin 200mg  twice daily - may need to do imaging in the future if needed  We will check EMG/nerve conduction study due to decreased sensation of left leg to ensure no nerve impingement   Maintain strict control of hypertension with blood pressure goal below 130/90, diabetes with hemoglobin A1c goal below 6.5% and cholesterol with LDL cholesterol (bad cholesterol) goal below 70 mg/dL. I also advised the patient to eat a healthy diet with plenty of whole grains, cereals, fruits and vegetables, exercise regularly and maintain ideal body weight.  Followup in the future with me as needed or call earlier if needed       Thank you for coming to see [name] at [facility]. I hope we have been able to provide you high quality care today.  You may receive a patient satisfaction survey over the next few weeks. We would appreciate your feedback and comments so that we may continue to improve ourselves and the health of our patients.    Electromyoneurogram Electromyoneurogram is a test to check how well your muscles and nerves are working. This procedure includes the combined use of electromyogram (EMG) and nerve conduction study (NCS). EMG is used to look for muscular disorders. NCS, which is also called electroneurogram, measures how well your nerves are controlling your muscles. The procedures are usually done together to check if your muscles and nerves are healthy. If the results of the tests are abnormal, this may indicate disease or injury, such as a neuromuscular disease or peripheral nerve damage. Tell a health care provider about:  Any allergies you have.  All  medicines you are taking, including vitamins, herbs, eye drops, creams, and over-the-counter medicines.  Any problems you or family members have had with anesthetic medicines.  Any blood disorders you have.  Any surgeries you have had.  Any medical conditions you have.  If you have a pacemaker.  Whether you are pregnant or may be pregnant. What are the risks? Generally, this is a safe procedure. However, problems may occur, including:  Infection where the electrodes were inserted.  Bleeding. What happens before the procedure? Medicines Ask your health care provider about:  Changing or stopping your regular medicines. This is especially important if you are taking diabetes medicines or blood thinners.  Taking medicines such as aspirin and ibuprofen. These medicines can thin your blood. Do not take these medicines unless your health care provider tells you to take them.  Taking over-the-counter medicines, vitamins, herbs, and supplements. General instructions  Your health care provider may ask you to avoid: ? Beverages that have caffeine, such as coffee and tea. ? Any products that contain nicotine or tobacco. These products include cigarettes, e-cigarettes, and chewing tobacco. If you need help quitting, ask your health care provider.  Do not use lotions or creams on the same day that you will be having the procedure. What happens during the procedure? For EMG   Your health care provider will ask you to stay in a position so that he or she can access the muscle that will be studied. You may be standing, sitting, or lying down.  You may be given a medicine that numbs the area (local anesthetic).  A very thin needle that has an electrode will be inserted into your muscle.  Another small electrode will be placed on your skin near the muscle.  Your health care provider will ask you to continue to remain still.  The electrodes will send a signal that tells about the  electrical activity of your muscles. You may see this on a monitor or hear it in the room.  After your muscles have been studied at rest, your health care provider will ask you to contract or flex your muscles. The electrodes will send a signal that tells about the electrical activity of your muscles.  Your health care provider will remove the electrodes and the electrode needles when the procedure is finished. The procedure may vary among health care providers and hospitals. For NCS   An electrode that records your nerve activity (recording electrode) will be placed on your skin by the muscle that is being studied.  An electrode that is used as a reference (reference electrode) will be placed near the recording electrode.  A paste or gel will be applied to your skin between the recording electrode and the reference electrode.  Your nerve will be stimulated with a mild shock. Your health care provider will measure how much time it takes for your muscle to react.  Your health care provider will remove the electrodes and the gel when the procedure is finished. The procedure may vary among health care providers and hospitals. What happens after the procedure?  It is up to you to get the results of your procedure. Ask your health care provider, or the department that is doing the procedure, when your results will be ready.  Your health care provider may: ? Give you medicines for any pain. ? Monitor the insertion sites to make sure that bleeding stops. Summary  Electromyoneurogram is a test to check how well your muscles and nerves are working.  If the results of the tests are abnormal, this may indicate disease or injury.  This is a safe procedure. However, problems may occur, such as bleeding and infection.  Your health care provider will do two tests to complete this procedure. One checks your muscles (EMG) and another checks your nerves (NCS).  It is up to you to get the results of  your procedure. Ask your health care provider, or the department that is doing the procedure, when your results will be ready. This information is not intended to replace advice given to you by your health care provider. Make sure you discuss any questions you have with your health care provider. Document Released: 11/10/2004 Document Revised: 03/26/2018 Document Reviewed: 03/08/2018 Elsevier Patient Education  2020 [employer].

## 2019-02-05 NOTE — Progress Notes (Signed)
I agree with the above plan 

## 2019-02-10 ENCOUNTER — Ambulatory Visit (INDEPENDENT_AMBULATORY_CARE_PROVIDER_SITE_OTHER): Payer: Medicare Other | Admitting: Orthotics

## 2019-02-10 ENCOUNTER — Other Ambulatory Visit: Payer: Self-pay

## 2019-02-10 DIAGNOSIS — E1140 Type 2 diabetes mellitus with diabetic neuropathy, unspecified: Secondary | ICD-10-CM | POA: Diagnosis not present

## 2019-02-10 DIAGNOSIS — E1342 Other specified diabetes mellitus with diabetic polyneuropathy: Secondary | ICD-10-CM | POA: Diagnosis not present

## 2019-02-10 DIAGNOSIS — L84 Corns and callosities: Secondary | ICD-10-CM | POA: Diagnosis not present

## 2019-02-10 DIAGNOSIS — Q828 Other specified congenital malformations of skin: Secondary | ICD-10-CM

## 2019-02-10 DIAGNOSIS — E1142 Type 2 diabetes mellitus with diabetic polyneuropathy: Secondary | ICD-10-CM

## 2019-02-10 NOTE — Progress Notes (Signed)

## 2019-02-14 DIAGNOSIS — Z7984 Long term (current) use of oral hypoglycemic drugs: Secondary | ICD-10-CM | POA: Diagnosis not present

## 2019-02-14 DIAGNOSIS — Z8673 Personal history of transient ischemic attack (TIA), and cerebral infarction without residual deficits: Secondary | ICD-10-CM | POA: Diagnosis not present

## 2019-02-14 DIAGNOSIS — I110 Hypertensive heart disease with heart failure: Secondary | ICD-10-CM | POA: Diagnosis not present

## 2019-02-14 DIAGNOSIS — I5030 Unspecified diastolic (congestive) heart failure: Secondary | ICD-10-CM | POA: Diagnosis not present

## 2019-02-14 DIAGNOSIS — Z87891 Personal history of nicotine dependence: Secondary | ICD-10-CM | POA: Diagnosis not present

## 2019-02-14 DIAGNOSIS — E1142 Type 2 diabetes mellitus with diabetic polyneuropathy: Secondary | ICD-10-CM | POA: Diagnosis not present

## 2019-02-14 DIAGNOSIS — M48061 Spinal stenosis, lumbar region without neurogenic claudication: Secondary | ICD-10-CM | POA: Diagnosis not present

## 2019-02-14 DIAGNOSIS — E1165 Type 2 diabetes mellitus with hyperglycemia: Secondary | ICD-10-CM | POA: Diagnosis not present

## 2019-02-19 DIAGNOSIS — E1165 Type 2 diabetes mellitus with hyperglycemia: Secondary | ICD-10-CM | POA: Diagnosis not present

## 2019-02-19 DIAGNOSIS — I110 Hypertensive heart disease with heart failure: Secondary | ICD-10-CM | POA: Diagnosis not present

## 2019-02-19 DIAGNOSIS — M48061 Spinal stenosis, lumbar region without neurogenic claudication: Secondary | ICD-10-CM | POA: Diagnosis not present

## 2019-02-19 DIAGNOSIS — E1142 Type 2 diabetes mellitus with diabetic polyneuropathy: Secondary | ICD-10-CM | POA: Diagnosis not present

## 2019-02-19 DIAGNOSIS — Z8673 Personal history of transient ischemic attack (TIA), and cerebral infarction without residual deficits: Secondary | ICD-10-CM | POA: Diagnosis not present

## 2019-02-19 DIAGNOSIS — I5030 Unspecified diastolic (congestive) heart failure: Secondary | ICD-10-CM | POA: Diagnosis not present

## 2019-02-24 ENCOUNTER — Ambulatory Visit (INDEPENDENT_AMBULATORY_CARE_PROVIDER_SITE_OTHER): Payer: Medicare Other | Admitting: Podiatry

## 2019-02-24 ENCOUNTER — Other Ambulatory Visit: Payer: Self-pay

## 2019-02-24 ENCOUNTER — Encounter: Payer: Self-pay | Admitting: Podiatry

## 2019-02-24 VITALS — Temp 97.7°F

## 2019-02-24 DIAGNOSIS — B351 Tinea unguium: Secondary | ICD-10-CM

## 2019-02-24 DIAGNOSIS — E1142 Type 2 diabetes mellitus with diabetic polyneuropathy: Secondary | ICD-10-CM | POA: Diagnosis not present

## 2019-02-24 DIAGNOSIS — L84 Corns and callosities: Secondary | ICD-10-CM

## 2019-02-24 DIAGNOSIS — M79676 Pain in unspecified toe(s): Secondary | ICD-10-CM | POA: Diagnosis not present

## 2019-02-24 NOTE — Progress Notes (Signed)
Subjective: [name] presents to clinic for preventative diabetic foot care. He is seen for management of painful, mycotic toenails and plantar callosities b/l.    [name] states he has received his diabetic shoes.  He is pleased with them and voices no discomfort nor irritations with them.  Curtis Pier, MD is his PCP. Last visit was 01/27/2019.   Current Outpatient Medications:  .  acetaminophen (TYLENOL) 500 MG tablet, Take 1 tablet (500 mg total) by mouth 2 (two) times daily as needed., Disp: 60 tablet, Rfl: 2 .  atorvastatin (LIPITOR) 40 MG tablet, Take 1 tablet (40 mg total) by mouth daily., Disp: 30 tablet, Rfl: 6 .  betamethasone dipropionate 0.05 % lotion, , Disp: , Rfl:  .  Blood Glucose Monitoring Suppl (TRUE METRIX METER) w/Device KIT, Use as directed, Disp: 1 kit, Rfl: 0 .  furosemide (LASIX) 40 MG tablet, 1 tab PO daily.  Take an extra one for two days PRN for increase lower extremity swelling, Disp: 40 tablet, Rfl: 6 .  gabapentin (NEURONTIN) 100 MG capsule, Take 2 capsules (200 mg total) by mouth 2 (two) times daily., Disp: 120 capsule, Rfl: 6 .  glipiZIDE (GLUCOTROL XL) 10 MG 24 hr tablet, TAKE ONE TABLET BY MOUTH DAILY WITH BREAKFAST, Disp: 30 tablet, Rfl: 2 .  Glucose Blood (TRUE METRIX BLOOD GLUCOSE TEST VI), USE AS DIRECTED, Disp: , Rfl: 0 .  glucose blood (TRUE METRIX BLOOD GLUCOSE TEST) test strip, Check blood sugars 1-2 times daily, Disp: 100 each, Rfl: 12 .  glucose blood test strip, CHECK BLOOD SUGARS 1-2 TIMES DAILY, Disp: , Rfl: 12 .  hydrALAZINE (APRESOLINE) 10 MG tablet, Take 1 tablet (10 mg total) by mouth 2 (two) times daily., Disp: 60 tablet, Rfl: 6 .  labetalol (NORMODYNE) 100 MG tablet, TAKE ONE TABLET BY MOUTH TWICE A DAY, Disp: 180 tablet, Rfl: 0 .  Lancets Misc. (ACCU-CHEK SOFTCLIX LANCET DEV) KIT, Use as directed, Disp: 1 kit, Rfl: 0 .  lisinopril (ZESTRIL) 40 MG tablet, TAKE ONE TABLET BY MOUTH DAILY, Disp: 90 tablet, Rfl: 0 .  loratadine  (CLARITIN) 10 MG tablet, Take 1 tablet (10 mg total) by mouth daily as needed for allergies., Disp: 30 tablet, Rfl: 3 .  metFORMIN (GLUCOPHAGE) 1000 MG tablet, TAKE ONE TABLET BY MOUTH TWICE A DAY WITH MEALS, Disp: 60 tablet, Rfl: 2 .  triamcinolone cream (KENALOG) 0.1 %, APPLY 1 APPLICATION TOPICALLY TWO (TWO) TIMES DAILY., Disp: 45 g, Rfl: 0 .  TRUEPLUS LANCETS 28G MISC, Use as directed, Disp: 100 each, Rfl: 6  Allergies  Allergen Reactions  . Amlodipine Swelling    Lower extremity swelling    Objective: Vitals:   02/24/19 0845  Temp: 97.7 F (36.5 C)    Vascular Examination: Capillary refill time <3 seconds x 10 digits.  Dorsalis pedis pulses faintly palpable b/l.  Posterior tibial pulses faintly palpable b/l.  Digital hair absent x 10 digits.  Skin temperature gradient WNL b/l.  Dermatological Examination: Skin with normal turgor, texture and tone b/l.  Toenails 1-5 b/l discolored, thick, dystrophic with subungual debris and pain with palpation to nailbeds due to thickness of nails.  Hyperkeratotic lesion plantar hallux IPJ left foot with subdermal hemorrhage. No edema, no erythema, no drainage, no flocculence.  Hyperkeratotic lesion submet head 4 left foot. No erythema, no edema, no drainage, no flocculence noted.   No skin irritations noted b/l feet with new diabetic shoe gear.  Musculoskeletal: Muscle strength 5/5 to all LE muscle  groups.  HAV with bunion deformity b/l.  Hammertoe deformity 2-5 b/l.  No pain, crepitus or joint limitation with passive/active ROM.  Neurological: Sensation diminished with 10 gram monofilament.  Assessment: 1. Painful onychomycosis toenails 1-5 b/l 2. Preulcerative callus left hallux 3. Callus submet head 4 left foot 4. NIDDM with Diabetic neuropathy  Plan: 1. Continue diabetic foot care principles. Literature dispensed on today. 2. Toenails 1-5 b/l were debrided in length and girth without iatrogenic  bleeding. 3. Preulcerative callus pared left hallux utilizing sterile scalpel blade without incident.  4. Callus pared submetatarsal head 4 left foot utilizing sterile scalpel blade without incident.  5. Patient to continue his diabetic shoe gear daily. 6. Patient to report any pedal injuries to medical professional  7. Follow up 9 weeks. 8. Patient/POA to call should there be a concern in the interim.

## 2019-02-24 NOTE — Patient Instructions (Addendum)
Diabetes Mellitus and Foot Care Foot care is an important part of your health, especially when you have diabetes. Diabetes may cause you to have problems because of poor blood flow (circulation) to your feet and legs, which can cause your skin to:  Become thinner and drier.  Break more easily.  Heal more slowly.  Peel and crack. You may also have nerve damage (neuropathy) in your legs and feet, causing decreased feeling in them. This means that you may not notice minor injuries to your feet that could lead to more serious problems. Noticing and addressing any potential problems early is the best way to prevent future foot problems. How to care for your feet Foot hygiene  Wash your feet daily with warm water and mild soap. Do not use hot water. Then, pat your feet and the areas between your toes until they are completely dry. Do not soak your feet as this can dry your skin.  Trim your toenails straight across. Do not dig under them or around the cuticle. File the edges of your nails with an emery board or nail file.  Apply a moisturizing lotion or petroleum jelly to the skin on your feet and to dry, brittle toenails. Use lotion that does not contain alcohol and is unscented. Do not apply lotion between your toes. Shoes and socks  Wear clean socks or stockings every day. Make sure they are not too tight. Do not wear knee-high stockings since they may decrease blood flow to your legs.  Wear shoes that fit properly and have enough cushioning. Always look in your shoes before you put them on to be sure there are no objects inside.  To break in new shoes, wear them for just a few hours a day. This prevents injuries on your feet. Wounds, scrapes, corns, and calluses  Check your feet daily for blisters, cuts, bruises, sores, and redness. If you cannot see the bottom of your feet, use a mirror or ask someone for help.  Do not cut corns or calluses or try to remove them with medicine.  If you  find a minor scrape, cut, or break in the skin on your feet, keep it and the skin around it clean and dry. You may clean these areas with mild soap and water. Do not clean the area with peroxide, alcohol, or iodine.  If you have a wound, scrape, corn, or callus on your foot, look at it several times a day to make sure it is healing and not infected. Check for: ? Redness, swelling, or pain. ? Fluid or blood. ? Warmth. ? Pus or a bad smell. General instructions  Do not cross your legs. This may decrease blood flow to your feet.  Do not use heating pads or hot water bottles on your feet. They may burn your skin. If you have lost feeling in your feet or legs, you may not know this is happening until it is too late.  Protect your feet from hot and cold by wearing shoes, such as at the beach or on hot pavement.  Schedule a complete foot exam at least once a year (annually) or more often if you have foot problems. If you have foot problems, report any cuts, sores, or bruises to your health care provider immediately. Contact a health care provider if:  You have a medical condition that increases your risk of infection and you have any cuts, sores, or bruises on your feet.  You have an injury that is not   healing.  You have redness on your legs or feet.  You feel burning or tingling in your legs or feet.  You have pain or cramps in your legs and feet.  Your legs or feet are numb.  Your feet always feel cold.  You have pain around a toenail. Get help right away if:  You have a wound, scrape, corn, or callus on your foot and: ? You have pain, swelling, or redness that gets worse. ? You have fluid or blood coming from the wound, scrape, corn, or callus. ? Your wound, scrape, corn, or callus feels warm to the touch. ? You have pus or a bad smell coming from the wound, scrape, corn, or callus. ? You have a fever. ? You have a red line going up your leg. Summary  Check your feet every day  for cuts, sores, red spots, swelling, and blisters.  Moisturize feet and legs daily.  Wear shoes that fit properly and have enough cushioning.  If you have foot problems, report any cuts, sores, or bruises to your health care provider immediately.  Schedule a complete foot exam at least once a year (annually) or more often if you have foot problems. This information is not intended to replace advice given to you by your health care provider. Make sure you discuss any questions you have with your health care provider. Document Released: 07/07/2000 Document Revised: 08/22/2017 Document Reviewed: 08/11/2016 Elsevier Patient Education  2020 [employer]  Corns and Calluses Corns are small areas of thickened skin that occur on the top, sides, or tip of a toe. They contain a cone-shaped core with a point that can press on a nerve below. This causes pain.  Calluses are areas of thickened skin that can occur anywhere on the body, including the hands, fingers, palms, soles of the feet, and heels. Calluses are usually larger than corns. What are the causes? Corns and calluses are caused by rubbing (friction) or pressure, such as from shoes that are too tight or do not fit properly. What increases the risk? Corns are more likely to develop in people who have misshapen toes (toe deformities), such as hammer toes. Calluses can occur with friction to any area of the skin. They are more likely to develop in people who:  Work with their hands.  Wear shoes that fit poorly, are too tight, or are high-heeled.  Have toe deformities. What are the signs or symptoms? Symptoms of a corn or callus include:  A hard growth on the skin.  Pain or tenderness under the skin.  Redness and swelling.  Increased discomfort while wearing tight-fitting shoes, if your feet are affected. If a corn or callus becomes infected, symptoms may include:  Redness and swelling that gets worse.  Pain.  Fluid, blood, or  pus draining from the corn or callus. How is this diagnosed? Corns and calluses may be diagnosed based on your symptoms, your medical history, and a physical exam. How is this treated? Treatment for corns and calluses may include:  Removing the cause of the friction or pressure. This may involve: ? Changing your shoes. ? Wearing shoe inserts (orthotics) or other protective layers in your shoes, such as a corn pad. ? Wearing gloves.  Applying medicine to the skin (topical medicine) to help soften skin in the hardened, thickened areas.  Removing layers of dead skin with a file to reduce the size of the corn or callus.  Removing the corn or callus with a scalpel or   laser.  Taking antibiotic medicines, if your corn or callus is infected.  Having surgery, if a toe deformity is the cause. Follow these instructions at home:   Take over-the-counter and prescription medicines only as told by your health care provider.  If you were prescribed an antibiotic, take it as told by your health care provider. Do not stop taking it even if your condition starts to improve.  Wear shoes that fit well. Avoid wearing high-heeled shoes and shoes that are too tight or too loose.  Wear any padding, protective layers, gloves, or orthotics as told by your health care provider.  Soak your hands or feet and then use a file or pumice stone to soften your corn or callus. Do this as told by your health care provider.  Check your corn or callus every day for symptoms of infection. Contact a health care provider if you:  Notice that your symptoms do not improve with treatment.  Have redness or swelling that gets worse.  Notice that your corn or callus becomes painful.  Have fluid, blood, or pus coming from your corn or callus.  Have new symptoms. Summary  Corns are small areas of thickened skin that occur on the top, sides, or tip of a toe.  Calluses are areas of thickened skin that can occur anywhere  on the body, including the hands, fingers, palms, and soles of the feet. Calluses are usually larger than corns.  Corns and calluses are caused by rubbing (friction) or pressure, such as from shoes that are too tight or do not fit properly.  Treatment may include wearing any padding, protective layers, gloves, or orthotics as told by your health care provider. This information is not intended to replace advice given to you by your health care provider. Make sure you discuss any questions you have with your health care provider. Document Released: 04/15/2004 Document Revised: 10/30/2018 Document Reviewed: 05/23/2017 Elsevier Patient Education  2020 Elsevier Inc.  

## 2019-02-27 DIAGNOSIS — I110 Hypertensive heart disease with heart failure: Secondary | ICD-10-CM | POA: Diagnosis not present

## 2019-02-27 DIAGNOSIS — E1142 Type 2 diabetes mellitus with diabetic polyneuropathy: Secondary | ICD-10-CM | POA: Diagnosis not present

## 2019-02-27 DIAGNOSIS — Z8673 Personal history of transient ischemic attack (TIA), and cerebral infarction without residual deficits: Secondary | ICD-10-CM | POA: Diagnosis not present

## 2019-02-27 DIAGNOSIS — E1165 Type 2 diabetes mellitus with hyperglycemia: Secondary | ICD-10-CM | POA: Diagnosis not present

## 2019-02-27 DIAGNOSIS — M48061 Spinal stenosis, lumbar region without neurogenic claudication: Secondary | ICD-10-CM | POA: Diagnosis not present

## 2019-02-27 DIAGNOSIS — I5030 Unspecified diastolic (congestive) heart failure: Secondary | ICD-10-CM | POA: Diagnosis not present

## 2019-03-12 DIAGNOSIS — Z8673 Personal history of transient ischemic attack (TIA), and cerebral infarction without residual deficits: Secondary | ICD-10-CM | POA: Diagnosis not present

## 2019-03-12 DIAGNOSIS — I110 Hypertensive heart disease with heart failure: Secondary | ICD-10-CM | POA: Diagnosis not present

## 2019-03-12 DIAGNOSIS — E1142 Type 2 diabetes mellitus with diabetic polyneuropathy: Secondary | ICD-10-CM | POA: Diagnosis not present

## 2019-03-12 DIAGNOSIS — M48061 Spinal stenosis, lumbar region without neurogenic claudication: Secondary | ICD-10-CM | POA: Diagnosis not present

## 2019-03-12 DIAGNOSIS — E1165 Type 2 diabetes mellitus with hyperglycemia: Secondary | ICD-10-CM | POA: Diagnosis not present

## 2019-03-12 DIAGNOSIS — I5030 Unspecified diastolic (congestive) heart failure: Secondary | ICD-10-CM | POA: Diagnosis not present

## 2019-03-13 ENCOUNTER — Encounter (INDEPENDENT_AMBULATORY_CARE_PROVIDER_SITE_OTHER): Payer: Medicare Other | Admitting: Diagnostic Neuroimaging

## 2019-03-13 ENCOUNTER — Ambulatory Visit (INDEPENDENT_AMBULATORY_CARE_PROVIDER_SITE_OTHER): Payer: Medicare Other | Admitting: Diagnostic Neuroimaging

## 2019-03-13 ENCOUNTER — Other Ambulatory Visit: Payer: Self-pay

## 2019-03-13 DIAGNOSIS — R208 Other disturbances of skin sensation: Secondary | ICD-10-CM | POA: Diagnosis not present

## 2019-03-13 DIAGNOSIS — R202 Paresthesia of skin: Secondary | ICD-10-CM

## 2019-03-13 DIAGNOSIS — Z0289 Encounter for other administrative examinations: Secondary | ICD-10-CM

## 2019-03-20 NOTE — Procedures (Signed)
[facility]  NCS (NERVE CONDUCTION STUDY) WITH EMG (ELECTROMYOGRAPHY) REPORT   STUDY DATE: 03/20/19 PATIENT NAME: [name] DOB: 1961-03-20 MRN: [id number]  ORDERING CLINICIAN: Frann Rider, NP  TECHNOLOGIST: Sherre Scarlet ELECTROMYOGRAPHER: Earlean Polka. , MD  CLINICAL INFORMATION: 58 year old male with left leg numbness and weakness. History of diabetes and lumbar spinal stenosis.  FINDINGS: NERVE CONDUCTION STUDY:  Bilateral peroneal motor responses have normal distal latencies, decreased amplitudes, normal conduction velocity on the left, slow conduction velocity in the right.  Bilateral tibial motor responses of normal distal latencies, decreased amplitudes, normal conduction velocities.  Bilateral sural and superficial peroneal sensory responses could not be obtained.  Bilateral tibial F wave latencies are slightly prolonged.   NEEDLE ELECTROMYOGRAPHY:  Needle examination of left lower extremity demonstrates decreased motor unit recruitment in left tibialis anterior and left gastrocnemius muscles.  Positive sharp waves and fibrillation potentials noted in left gastrocnemius at rest.  Left vastus medialis is normal.   IMPRESSION:   Abnormal study demonstrating: - Bilateral axonal sensorimotor polyneuropathy. Sensory responses could not be detected.  - Active and chronic denervation in left tibialis anterior and left gastrocnemius muscles. This may be due to underlying neuropathy vs L5, S1 radiculopathies.    INTERPRETING PHYSICIAN:  Penni Bombard, MD Certified in Neurology, Neurophysiology and Neuroimaging  [facility] [address], [place], [place] [address] [phone]   [facility]    Nerve / Sites Muscle Latency Ref. Amplitude Ref. Rel Amp Segments Distance Velocity Ref. Area    ms ms mV mV %  cm m/s m/s mVms  L Peroneal - EDB     Ankle EDB 4.5 ?6.5 0.6 ?2.0 100 Ankle - EDB 9   2.3     Fib head EDB  11.7  0.2  32.3 Fib head - Ankle 35 48 ?44 0.8     Pop fossa EDB 14.0  0.2  107 Pop fossa - Fib head 10 45 ?44 0.8         Pop fossa - Ankle      R Peroneal - EDB     Ankle EDB 5.7 ?6.5 1.4 ?2.0 100 Ankle - EDB 9   2.9     Fib head EDB 14.6  1.2  87.7 Fib head - Ankle 35 39 ?44 2.4     Pop fossa EDB 17.2  1.1  92.8 Pop fossa - Fib head 10 38 ?44 2.3         Pop fossa - Ankle      L Tibial - AH     Ankle AH 4.3 ?5.8 1.9 ?4.0 100 Ankle - AH 9   4.0     Pop fossa AH 14.9  1.2  61.2 Pop fossa - Ankle 43 41 ?41 2.8  R Tibial - AH     Ankle AH 3.8 ?5.8 1.7 ?4.0 100 Ankle - AH 9   6.5     Pop fossa AH 13.7  0.6  36.3 Pop fossa - Ankle 43 43 ?41 2.6             SNC    Nerve / Sites Rec. Site Peak Lat Ref.  Amp Ref. Segments Distance    ms ms V V  cm  L Sural - Ankle (Calf)     Calf Ankle NR ?4.4 NR ?6 Calf - Ankle 14  R Sural - Ankle (Calf)     Calf Ankle NR ?4.4 NR ?6 Calf - Ankle 14  L Superficial peroneal - Ankle     Lat leg Ankle NR ?4.4 NR ?6 Lat leg - Ankle 14  R Superficial peroneal - Ankle     Lat leg Ankle NR ?4.4 NR ?6 Lat leg - Ankle 14              F  Wave    Nerve F Lat Ref.   ms ms  L Tibial - AH 56.6 ?56.0  R Tibial - AH 64.2 ?56.0         EMG full       EMG Summary Table    Spontaneous MUAP Recruitment  Muscle IA Fib PSW Fasc Other Amp Dur. Poly Pattern  L. Vastus medialis Normal None None None _______ Normal Normal Normal Normal  L. Tibialis anterior Normal None None None _______ Increased Normal Normal Reduced  L. Gastrocnemius (Medial head) Normal 2+ 2+ None _______ Normal Normal Normal Reduced

## 2019-03-24 ENCOUNTER — Other Ambulatory Visit: Payer: Self-pay | Admitting: Internal Medicine

## 2019-03-24 DIAGNOSIS — E118 Type 2 diabetes mellitus with unspecified complications: Secondary | ICD-10-CM

## 2019-03-26 ENCOUNTER — Telehealth: Payer: Self-pay | Admitting: *Deleted

## 2019-03-26 NOTE — Telephone Encounter (Signed)
-----   Message from Frann Rider, NP sent at 03/25/2019 12:56 PM EDT ----- Please advise patient that his recent EMG/NCV showed neuropathy in bilateral lower extremities likely secondary to diabetes.  There is also abnormality within left leg that is likely due to underlying neuropathy or from prior spinal injury in 2016 but no acute findings.  Recommend ongoing follow-up with PCP for ongoing monitoring and management of neuropathic pain

## 2019-03-26 NOTE — Telephone Encounter (Signed)
I called pt and relayed the [place]/EMG study results as per JM/NP.  He verbalized understanding after repeating a second time to him.  His pcp, Dr. Karle Plumber is in epic and is able to view study.

## 2019-04-01 ENCOUNTER — Other Ambulatory Visit: Payer: Self-pay | Admitting: Internal Medicine

## 2019-04-01 DIAGNOSIS — IMO0002 Reserved for concepts with insufficient information to code with codable children: Secondary | ICD-10-CM

## 2019-04-01 DIAGNOSIS — E1142 Type 2 diabetes mellitus with diabetic polyneuropathy: Secondary | ICD-10-CM

## 2019-04-01 DIAGNOSIS — I10 Essential (primary) hypertension: Secondary | ICD-10-CM

## 2019-04-04 ENCOUNTER — Other Ambulatory Visit: Payer: Self-pay | Admitting: Internal Medicine

## 2019-04-04 ENCOUNTER — Telehealth: Payer: Self-pay | Admitting: Internal Medicine

## 2019-04-04 DIAGNOSIS — E118 Type 2 diabetes mellitus with unspecified complications: Secondary | ICD-10-CM

## 2019-04-04 NOTE — Telephone Encounter (Signed)
New Message   Pt states he has questions about his diabetes medication and did not want to clarify. Please f/u

## 2019-04-04 NOTE — Telephone Encounter (Signed)
Will forward to Luke  

## 2019-04-07 NOTE — Telephone Encounter (Signed)
Pt called and concerns were addressed. No further questions or concerns at this time.

## 2019-04-14 ENCOUNTER — Encounter (INDEPENDENT_AMBULATORY_CARE_PROVIDER_SITE_OTHER): Payer: Medicare Other | Admitting: Ophthalmology

## 2019-04-14 ENCOUNTER — Other Ambulatory Visit: Payer: Self-pay

## 2019-04-14 DIAGNOSIS — E113392 Type 2 diabetes mellitus with moderate nonproliferative diabetic retinopathy without macular edema, left eye: Secondary | ICD-10-CM

## 2019-04-14 DIAGNOSIS — H35033 Hypertensive retinopathy, bilateral: Secondary | ICD-10-CM

## 2019-04-14 DIAGNOSIS — I10 Essential (primary) hypertension: Secondary | ICD-10-CM | POA: Diagnosis not present

## 2019-04-14 DIAGNOSIS — H43813 Vitreous degeneration, bilateral: Secondary | ICD-10-CM

## 2019-04-14 DIAGNOSIS — H2513 Age-related nuclear cataract, bilateral: Secondary | ICD-10-CM

## 2019-04-14 DIAGNOSIS — E11319 Type 2 diabetes mellitus with unspecified diabetic retinopathy without macular edema: Secondary | ICD-10-CM | POA: Diagnosis not present

## 2019-04-14 DIAGNOSIS — E113291 Type 2 diabetes mellitus with mild nonproliferative diabetic retinopathy without macular edema, right eye: Secondary | ICD-10-CM

## 2019-04-17 ENCOUNTER — Ambulatory Visit (HOSPITAL_BASED_OUTPATIENT_CLINIC_OR_DEPARTMENT_OTHER): Payer: Medicare Other | Admitting: Pharmacist

## 2019-04-17 ENCOUNTER — Encounter: Payer: Self-pay | Admitting: Internal Medicine

## 2019-04-17 ENCOUNTER — Other Ambulatory Visit: Payer: Self-pay

## 2019-04-17 ENCOUNTER — Other Ambulatory Visit: Payer: Self-pay | Admitting: Internal Medicine

## 2019-04-17 ENCOUNTER — Ambulatory Visit: Payer: Medicare Other | Attending: Internal Medicine | Admitting: Internal Medicine

## 2019-04-17 VITALS — BP 133/82 | HR 73 | Temp 98.3°F | Resp 18 | Ht 75.0 in | Wt 273.0 lb

## 2019-04-17 DIAGNOSIS — Z87828 Personal history of other (healed) physical injury and trauma: Secondary | ICD-10-CM | POA: Insufficient documentation

## 2019-04-17 DIAGNOSIS — I110 Hypertensive heart disease with heart failure: Secondary | ICD-10-CM | POA: Diagnosis not present

## 2019-04-17 DIAGNOSIS — E1142 Type 2 diabetes mellitus with diabetic polyneuropathy: Secondary | ICD-10-CM | POA: Diagnosis not present

## 2019-04-17 DIAGNOSIS — Z23 Encounter for immunization: Secondary | ICD-10-CM

## 2019-04-17 DIAGNOSIS — M48061 Spinal stenosis, lumbar region without neurogenic claudication: Secondary | ICD-10-CM | POA: Insufficient documentation

## 2019-04-17 DIAGNOSIS — I69354 Hemiplegia and hemiparesis following cerebral infarction affecting left non-dominant side: Secondary | ICD-10-CM | POA: Diagnosis not present

## 2019-04-17 DIAGNOSIS — I5032 Chronic diastolic (congestive) heart failure: Secondary | ICD-10-CM | POA: Insufficient documentation

## 2019-04-17 DIAGNOSIS — E785 Hyperlipidemia, unspecified: Secondary | ICD-10-CM | POA: Insufficient documentation

## 2019-04-17 DIAGNOSIS — I10 Essential (primary) hypertension: Secondary | ICD-10-CM

## 2019-04-17 DIAGNOSIS — I252 Old myocardial infarction: Secondary | ICD-10-CM | POA: Diagnosis not present

## 2019-04-17 DIAGNOSIS — R531 Weakness: Secondary | ICD-10-CM

## 2019-04-17 DIAGNOSIS — Z87891 Personal history of nicotine dependence: Secondary | ICD-10-CM | POA: Diagnosis not present

## 2019-04-17 DIAGNOSIS — I2510 Atherosclerotic heart disease of native coronary artery without angina pectoris: Secondary | ICD-10-CM | POA: Insufficient documentation

## 2019-04-17 DIAGNOSIS — Z79899 Other long term (current) drug therapy: Secondary | ICD-10-CM | POA: Insufficient documentation

## 2019-04-17 DIAGNOSIS — E1165 Type 2 diabetes mellitus with hyperglycemia: Secondary | ICD-10-CM

## 2019-04-17 DIAGNOSIS — Z6830 Body mass index (BMI) 30.0-30.9, adult: Secondary | ICD-10-CM | POA: Insufficient documentation

## 2019-04-17 DIAGNOSIS — Z7984 Long term (current) use of oral hypoglycemic drugs: Secondary | ICD-10-CM | POA: Diagnosis not present

## 2019-04-17 DIAGNOSIS — IMO0002 Reserved for concepts with insufficient information to code with codable children: Secondary | ICD-10-CM

## 2019-04-17 DIAGNOSIS — Z888 Allergy status to other drugs, medicaments and biological substances status: Secondary | ICD-10-CM | POA: Diagnosis not present

## 2019-04-17 DIAGNOSIS — Z8249 Family history of ischemic heart disease and other diseases of the circulatory system: Secondary | ICD-10-CM | POA: Insufficient documentation

## 2019-04-17 DIAGNOSIS — Z833 Family history of diabetes mellitus: Secondary | ICD-10-CM | POA: Insufficient documentation

## 2019-04-17 DIAGNOSIS — E669 Obesity, unspecified: Secondary | ICD-10-CM | POA: Insufficient documentation

## 2019-04-17 LAB — POCT GLYCOSYLATED HEMOGLOBIN (HGB A1C): Hemoglobin A1C: 9.3 % — AB (ref 4.0–5.6)

## 2019-04-17 LAB — GLUCOSE, POCT (MANUAL RESULT ENTRY): POC Glucose: 179 mg/dl — AB (ref 70–99)

## 2019-04-17 MED ORDER — DAPAGLIFLOZIN PROPANEDIOL 5 MG PO TABS: 5.0000 mg | ORAL_TABLET | Freq: Every day | ORAL | 5 refills | Status: DC

## 2019-04-17 MED ORDER — METFORMIN HCL 1000 MG PO TABS: 1000.0000 mg | ORAL_TABLET | Freq: Two times a day (BID) | ORAL | 3 refills | Status: DC

## 2019-04-17 MED ORDER — TRUE METRIX BLOOD GLUCOSE TEST VI STRP: ORAL_STRIP | 12 refills | Status: DC

## 2019-04-17 MED ORDER — GLIPIZIDE ER 10 MG PO TB24: 10.0000 mg | ORAL_TABLET | Freq: Every day | ORAL | 6 refills | Status: DC

## 2019-04-17 MED FILL — TRUE METRIX GLUCOSE TEST ST: 50 days supply | Qty: 100 | Fill #0

## 2019-04-17 NOTE — Patient Instructions (Signed)
Please eliminate sugary drinks from your diet.  Try using Splenda in your tea instead of regular sugar.  Start the medication called Farxiga to help with better diabetes control.

## 2019-04-17 NOTE — Progress Notes (Signed)
Patient ID: [name], male    DOB: 1961/06/05  MRN: [id number]  CC: Follow-up   Subjective: [name] is a 58 y.o. male who presents for chronic ds management His concerns today include:  Pt withhistory ofDM2 with retinopathy and neuropathy, HTN, dCHF, formertob dep, HL, CVA (about 3 yrs ago per pt), spinal stenosis of lumbar spine,chronic lower extremity edema.   Patient saw the neurologist in follow-up given his history of TIA last year.  They did a EMG to evaluate the neuropathic symptoms in the legs/feet coupled with the left-sided weakness.  According to neurology note and verified by patient today, he suffered traumatic spinal cord contusion 12/2013 (after falling down an embankment).  I was not aware of this until now. -He did home PT for a while which she found helpful.  Therapy was discontinued after he was found to have infestation of bedbugs.  He has since had his house treated and mattress changed.  Therapist left him with instructions for home exercises which he states he does a few times a week.  He has not had any falls since I last spoke with him.  He uses his 4-prong cane even in the house.  Marland Kitchen    DIABETES TYPE 2 Last A1C:   Results for orders placed or performed in visit on 04/17/19  HgB A1c  Result Value Ref Range   Hemoglobin A1C 9.3 (A) 4.0 - 5.6 %   HbA1c POC (<> result, manual entry)     HbA1c, POC (prediabetic range)     HbA1c, POC (controlled diabetic range)    Glucose (CBG)  Result Value Ref Range   POC Glucose 179 (A) 70 - 99 mg/dl  A1c has increased since last checked in March. Med Adherence:  '[x]'  Yes    '[]'  No Medication side effects:  '[]'  Yes    '[x]'  No Home Monitoring?  '[]'  Yes    '[x]'  No, out of stripes Home glucose results range: Diet Adherence: uses sugar sugar in tea, drinks OJ Exercise: '[]'  Yes    '[x]'  No Hypoglycemic episodes?: '[]'  Yes    '[x]'  No Numbness of the feet? '[x]'  Yes    '[]'  No Retinopathy hx? '[x]'  Yes    '[]'  No Last eye exam:   Comments: He gets regular foot exam with a podiatrist Dr. Adah Perl.  Saw her last month.  HYPERTENSION/CHF Currently taking: see medication list Med Adherence: '[x]'  Yes    '[]'  No Medication side effects: '[]'  Yes    '[x]'  No Adherence with salt restriction: '[x]'  Yes    '[]'  No Home Monitoring?: '[]'  Yes    '[x]'  No Monitoring Frequency: '[]'  Yes    '[]'  No Home BP results range: '[]'  Yes    '[]'  No SOB? '[]'  Yes    '[x]'  No Chest Pain?: '[]'  Yes    '[x]'  No Leg swelling?: '[]'  Yes    '[x]'  No Headaches?: '[]'  Yes    '[x]'  No Dizziness? '[]'  Yes    '[x]'  No Comments: No PND orthopnea.  HM: needs flu shot.  Patient Active Problem List   Diagnosis Date Noted  . TIA (transient ischemic attack) 03/05/2018  . Benign essential HTN 03/05/2018  . Sensorineural hearing loss of both ears 12/04/2017  . Mild nonproliferative diabetic retinopathy ([place]) 01/10/2016  . Hypertensive retinopathy of both eyes 01/10/2016  . Spinal stenosis of lumbar region 07/15/2015  . Abnormality of gait 06/07/2015  . Hydrocephalus ([place]) 06/07/2015  . Peripheral neuropathy 04/26/2015  . Constipation 04/26/2015  .  Diabetes mellitus type 2 with complications ([place]) 22/63/3354  . Decreased hearing of both ears 03/15/2015  . Tinea pedis 03/15/2015  . Callus of foot 05/22/2014  . Coronary vasospasm, cocaine related 12/29/2013  . Cocaine abuse ([place]) 12/28/2013  . Left-sided weakness 12/26/2013  . Hyperlipidemia associated with type 2 diabetes mellitus ([place]) 04/01/2007  . Class 2 severe obesity due to excess calories with serious comorbidity and body mass index (BMI) of 36.0 to 36.9 in adult ([place]) 03/28/2007  . Essential hypertension 03/28/2007     Current Outpatient Medications on File Prior to Visit  Medication Sig Dispense Refill  . ACETAMINOPHEN EXTRA STRENGTH 500 MG tablet TAKE 1 TABLET (500 MG TOTAL) BY MOUTH TWO (TWO) TIMES DAILY AS NEEDED. 60 tablet 1  . atorvastatin (LIPITOR) 40 MG tablet Take 1 tablet (40 mg total) by mouth daily. 30 tablet 6   . betamethasone dipropionate 0.05 % lotion     . Blood Glucose Monitoring Suppl (TRUE METRIX METER) w/Device KIT Use as directed 1 kit 0  . furosemide (LASIX) 40 MG tablet TAKE ONE TABLET BY MOUTH DAILY . TAKE AN EXTRA TABLET FOR TWO DAYS AS NEEDED FOR INCREASE LOWER EXTREMITY SWELLING 40 tablet 2  . gabapentin (NEURONTIN) 100 MG capsule Take 2 capsules (200 mg total) by mouth 2 (two) times daily. 120 capsule 6  . hydrALAZINE (APRESOLINE) 10 MG tablet TAKE ONE TABLET BY MOUTH TWICE A DAY 60 tablet 2  . labetalol (NORMODYNE) 100 MG tablet TAKE ONE TABLET BY MOUTH TWICE A DAY 180 tablet 0  . Lancets Misc. (ACCU-CHEK SOFTCLIX LANCET DEV) KIT Use as directed 1 kit 0  . lisinopril (ZESTRIL) 40 MG tablet TAKE ONE TABLET BY MOUTH DAILY 90 tablet 0  . loratadine (CLARITIN) 10 MG tablet Take 1 tablet (10 mg total) by mouth daily as needed for allergies. 30 tablet 3  . triamcinolone cream (KENALOG) 0.1 % APPLY 1 APPLICATION TOPICALLY TWO (TWO) TIMES DAILY. 45 g 0  . TRUEPLUS LANCETS 28G MISC Use as directed 100 each 6   No current facility-administered medications on file prior to visit.     Allergies  Allergen Reactions  . Amlodipine Swelling    Lower extremity swelling    Social History   Socioeconomic History  . Marital status: Divorced    Spouse name: Not on file  . Number of children: 0  . Years of education: 10  . Highest education level: Not on file  Occupational History  . Occupation: Unemployed  Social Needs  . Financial resource strain: Not on file  . Food insecurity    Worry: Not on file    Inability: Not on file  . Transportation needs    Medical: Not on file    Non-medical: Not on file  Tobacco Use  . Smoking status: Former Smoker    Years: 45.00    Types: Cigarettes    Quit date: 03/03/2018    Years since quitting: 1.1  . Smokeless tobacco: Never Used  . Tobacco comment: 11-15-2017  per pt 1pp3days  Substance and Sexual Activity  . Alcohol use: Not Currently     Alcohol/week: 0.0 standard drinks    Comment: 11-15-2017  per pt last alcohol 12/ 2018  alcohol abuse  . Drug use: Not Currently    Types: "Crack" cocaine, Cocaine    Comment: 11-15-2017  per pt last used March 2019  . Sexual activity: Not Currently  Lifestyle  . Physical activity    Days per week: Not on  file    Minutes per session: Not on file  . Stress: Not on file  Relationships  . Social Herbalist on phone: Not on file    Gets together: Not on file    Attends religious service: Not on file    Active member of club or organization: Not on file    Attends meetings of clubs or organizations: Not on file    Relationship status: Not on file  . Intimate partner violence    Fear of current or ex partner: Not on file    Emotionally abused: Not on file    Physically abused: Not on file    Forced sexual activity: Not on file  Other Topics Concern  . Not on file  Social History Narrative   Lives at home alone.   Right-handed.   Occasional use.    Family History  Problem Relation Age of Onset  . Hypertension Mother   . Diabetes Mother   . Cancer Mother        breast cancer   . Hypertension Father   . Heart disease Father   . Stroke Father   . Hypertension Sister     Past Surgical History:  Procedure Laterality Date  . CARDIAC CATHETERIZATION  12/01/2008   dr hochrein   abnormal stress myoview:  mild coronary plaque, normal LVF  . CARDIAC CATHETERIZATION  04/29/2011   dr [place]   in setting ECG with new ST elevation & severe hypertensive:  nonobstructive atherosclerotic CAD, normal LVF (30% mRCA)   . CARDIOVASCULAR STRESS TEST  08/15/2010   Low nuclear study w/ no evidence ishemia/  ef 42% with lateral and apical hypokinesis  . CYSTOSCOPY WITH BIOPSY N/A 11/20/2017   Procedure: CYSTOSCOPY WITH BIOPSY AND FULGURATION;  Surgeon: [name], MD;  Location: [facility];  Service: Urology;  Laterality: N/A;  . INCISION AND DRAINAGE RIGHT DISTAL  MEDIAL THIGH HEMATOMA  04-14-2005   dr Ninfa Linden  . left arm skin graft  1976   injury  . LEFT HEART CATHETERIZATION WITH CORONARY ANGIOGRAM Bilateral 12/26/2013   Procedure: LEFT HEART CATHETERIZATION WITH CORONARY ANGIOGRAM;  Surgeon: Burnell Blanks, MD;  Location: [facility];  Service: Cardiovascular;  Laterality: Bilateral;  STEMI, in setting cocaine/ alcohol :  mid disease in the RCA (20%), moderate disease in the small caliber intermediate branch (distal 50-60%), normal LVSF (ef 55-60%)  . TOOTH EXTRACTION N/A 05/16/2018   Procedure: DENTAL RESTORATION/EXTRACTIONS WITH ALVEO;  Surgeon: Diona Browner, DDS;  Location: [place];  Service: Oral Surgery;  Laterality: N/A;  . TRANSTHORACIC ECHOCARDIOGRAM  08/05/2017   moderate LVH,  ef 55%,  grade 1 diastolic dysfunction/  mild LAE/  trivial Tr    ROS: Review of Systems Negative except as stated above  PHYSICAL EXAM: BP 133/82 (BP Location: Left Arm, Patient Position: Sitting, Cuff Size: Large)   Pulse 73   Temp 98.3 F (36.8 C) (Oral)   Resp 18   Ht '6\' 3"'  (1.905 m)   Wt 273 lb (123.8 kg)   SpO2 100%   BMI 34.12 kg/m   Wt Readings from Last 3 Encounters:  04/17/19 273 lb (123.8 kg)  02/04/19 278 lb 3.2 oz (126.2 kg)  09/24/18 290 lb 9.6 oz (131.8 kg)    Physical Exam  General appearance - alert, well appearing, and in no distress Mental status - normal mood, behavior, speech, dress, motor activity, and thought processes Mouth - mucous membranes moist, pharynx  normal without lesions Neck - supple, no significant adenopathy Chest - clear to auscultation, no wheezes, rales or rhonchi, symmetric air entry Heart - normal rate, regular rhythm, normal S1, S2, no murmurs, rubs, clicks or gallops Extremities -no lower extremity edema. Diabetic Foot Exam - Simple   Simple Foot Form Visual Inspection See comments: Yes Sensation Testing See comments: Yes Pulse Check Posterior Tibialis and Dorsalis pulse  intact bilaterally: Yes Comments 3 cm nonulcerative callus on the plantar surface of the left big toe and on the ball of the left foot between the fourth and fifth toes.  Decreased sensation on leap exam on the soles of both feet.  Toenails are thick but not overgrown.      CMP Latest Ref Rng & Units 05/13/2018 03/05/2018 03/04/2018  Glucose 70 - 99 mg/dL 171(H) 148(H) 161(H)  BUN 6 - 20 mg/dL 21(H) 23(H) 29(H)  Creatinine 0.61 - 1.24 mg/dL 1.16 1.19 1.53(H)  Sodium 135 - 145 mmol/L 139 141 142  Potassium 3.5 - 5.1 mmol/L 4.8 4.8 4.6  Chloride 98 - 111 mmol/L 105 108 109  CO2 22 - 32 mmol/L '26 25 25  ' Calcium 8.9 - 10.3 mg/dL 9.2 9.5 9.2  Total Protein 6.5 - 8.1 g/dL - 7.1 -  Total Bilirubin 0.3 - 1.2 mg/dL - 0.7 -  Alkaline Phos 38 - 126 U/L - 58 -  AST 15 - 41 U/L - 18 -  ALT 0 - 44 U/L - 17 -   Lipid Panel     Component Value Date/Time   CHOL 164 03/05/2018 0731   TRIG 91 03/05/2018 0731   HDL 48 03/05/2018 0731   CHOLHDL 3.4 03/05/2018 0731   VLDL 18 03/05/2018 0731   LDLCALC 98 03/05/2018 0731    CBC    Component Value Date/Time   WBC 7.1 03/05/2018 0731   RBC 4.86 03/05/2018 0731   HGB 13.5 03/05/2018 0731   HGB 13.3 12/18/2017 1621   HCT 42.3 03/05/2018 0731   HCT 41.5 12/18/2017 1621   PLT 196 03/05/2018 0731   PLT 214 12/18/2017 1621   MCV 87.0 03/05/2018 0731   MCV 86 12/18/2017 1621   MCH 27.8 03/05/2018 0731   MCHC 31.9 03/05/2018 0731   RDW 13.2 03/05/2018 0731   RDW 14.0 12/18/2017 1621   LYMPHSABS 1.5 08/04/2017 1052   MONOABS 1.2 (H) 08/04/2017 1052   EOSABS 0.1 08/04/2017 1052   BASOSABS 0.0 08/04/2017 1052    ASSESSMENT AND PLAN: 1. Uncontrolled type 2 diabetes mellitus with peripheral neuropathy (HCC) Dietary counseling given.  Advised him to eliminate the sugary drinks including the juices.  Advised to use Splenda in his tea instead of regular sugar.  Continue metformin and Glucotrol.  We discussed adding low-dose of Lantus at bedtime but  patient declined stating that he does not want to be on any injectable medications.  Therefore we will start low-dose for Farxiga - HgB A1c - Glucose (CBG) - metFORMIN (GLUCOPHAGE) 1000 MG tablet; Take 1 tablet (1,000 mg total) by mouth 2 (two) times daily with a meal.  Dispense: 180 tablet; Refill: 3 - glipiZIDE (GLUCOTROL XL) 10 MG 24 hr tablet; Take 1 tablet (10 mg total) by mouth daily with breakfast.  Dispense: 30 tablet; Refill: 6 - glucose blood (TRUE METRIX BLOOD GLUCOSE TEST) test strip; Check blood sugars 1-2 times daily  Dispense: 100 each; Refill: 12 - glucose blood (TRUE METRIX BLOOD GLUCOSE TEST) test strip; Test BS twice a day before meals  Dispense:  100 each; Refill: 12 - dapagliflozin propanediol (FARXIGA) 5 MG TABS tablet; Take 5 mg by mouth daily before breakfast.  Dispense: 30 tablet; Refill: 5 - CBC - Comprehensive metabolic panel - Lipid panel  2. Essential hypertension Close to goal.  Continue current medications and low-salt diet  3. Chronic diastolic congestive heart failure ([place]) Compensated and weight is down 5 pounds since July.  Continue furosemide and other blood pressure medications including lisinopril, hydralazine, and labetalol  4. Left-sided weakness 5. History of spinal cord injury He has completed home PT and is found it helpful.  Encouraged him to continue using his cane when he ambulates  6. Obesity (BMI 30-39.9) See #1 above  7. Need for immunization against influenza Given      Patient was given the opportunity to ask questions.  Patient verbalized understanding of the plan and was able to repeat key elements of the plan.   Orders Placed This Encounter  Procedures  . CBC  . Comprehensive metabolic panel  . Lipid panel  . HgB A1c  . Glucose (CBG)     Requested Prescriptions   Signed Prescriptions Disp Refills  . metFORMIN (GLUCOPHAGE) 1000 MG tablet 180 tablet 3    Sig: Take 1 tablet (1,000 mg total) by mouth 2 (two) times daily  with a meal.  . glipiZIDE (GLUCOTROL XL) 10 MG 24 hr tablet 30 tablet 6    Sig: Take 1 tablet (10 mg total) by mouth daily with breakfast.  . glucose blood (TRUE METRIX BLOOD GLUCOSE TEST) test strip 100 each 12    Sig: Check blood sugars 1-2 times daily  . glucose blood (TRUE METRIX BLOOD GLUCOSE TEST) test strip 100 each 12    Sig: Test BS twice a day before meals  . dapagliflozin propanediol (FARXIGA) 5 MG TABS tablet 30 tablet 5    Sig: Take 5 mg by mouth daily before breakfast.    Return in about 3 months (around 07/17/2019).  Karle Plumber, MD, FACP

## 2019-04-18 LAB — COMPREHENSIVE METABOLIC PANEL
ALT: 14 IU/L (ref 0–44)
AST: 15 IU/L (ref 0–40)
Albumin/Globulin Ratio: 1.5 (ref 1.2–2.2)
Albumin: 4.3 g/dL (ref 3.8–4.9)
Alkaline Phosphatase: 75 IU/L (ref 39–117)
BUN/Creatinine Ratio: 15 (ref 10–24)
BUN: 22 mg/dL (ref 8–27)
Bilirubin Total: 0.3 mg/dL (ref 0.0–1.2)
CO2: 24 mmol/L (ref 20–29)
Calcium: 9.5 mg/dL (ref 8.6–10.2)
Chloride: 105 mmol/L (ref 96–106)
Creatinine, Ser: 1.42 mg/dL — ABNORMAL HIGH (ref 0.76–1.27)
GFR calc Af Amer: 62 mL/min/{1.73_m2} (ref >59)
GFR calc non Af Amer: 53 mL/min/{1.73_m2} — ABNORMAL LOW (ref >59)
Globulin, Total: 2.9 g/dL (ref 1.5–4.5)
Glucose: 201 mg/dL — ABNORMAL HIGH (ref 65–99)
Potassium: 4.8 mmol/L (ref 3.5–5.2)
Sodium: 141 mmol/L (ref 134–144)
Total Protein: 7.2 g/dL (ref 6.0–8.5)

## 2019-04-18 LAB — CBC
Hematocrit: 39.3 % (ref 37.5–51.0)
Hemoglobin: 13.3 g/dL (ref 13.0–17.7)
MCH: 28.4 pg (ref 26.6–33.0)
MCHC: 33.8 g/dL (ref 31.5–35.7)
MCV: 84 fL (ref 79–97)
Platelets: 233 10*3/uL (ref 150–450)
RBC: 4.69 x10E6/uL (ref 4.14–5.80)
RDW: 12.7 % (ref 11.6–15.4)
WBC: 9 10*3/uL (ref 3.4–10.8)

## 2019-04-18 LAB — LIPID PANEL
Chol/HDL Ratio: 3.4 ratio (ref 0.0–5.0)
Cholesterol, Total: 176 mg/dL (ref 100–199)
HDL: 52 mg/dL (ref >39)
LDL Chol Calc (NIH): 94 mg/dL (ref 0–99)
Triglycerides: 174 mg/dL — ABNORMAL HIGH (ref 0–149)
VLDL Cholesterol Cal: 30 mg/dL (ref 5–40)

## 2019-04-20 ENCOUNTER — Other Ambulatory Visit: Payer: Self-pay | Admitting: Internal Medicine

## 2019-04-20 MED ORDER — ATORVASTATIN CALCIUM 40 MG PO TABS: 60.0000 mg | ORAL_TABLET | Freq: Every day | ORAL | 6 refills | Status: DC

## 2019-05-06 ENCOUNTER — Ambulatory Visit (INDEPENDENT_AMBULATORY_CARE_PROVIDER_SITE_OTHER): Payer: Medicare Other | Admitting: Podiatry

## 2019-05-06 ENCOUNTER — Other Ambulatory Visit: Payer: Self-pay

## 2019-05-06 ENCOUNTER — Encounter: Payer: Self-pay | Admitting: Podiatry

## 2019-05-06 DIAGNOSIS — M79675 Pain in left toe(s): Secondary | ICD-10-CM

## 2019-05-06 DIAGNOSIS — L84 Corns and callosities: Secondary | ICD-10-CM

## 2019-05-06 DIAGNOSIS — E1142 Type 2 diabetes mellitus with diabetic polyneuropathy: Secondary | ICD-10-CM | POA: Diagnosis not present

## 2019-05-06 DIAGNOSIS — B351 Tinea unguium: Secondary | ICD-10-CM

## 2019-05-06 DIAGNOSIS — M79674 Pain in right toe(s): Secondary | ICD-10-CM

## 2019-05-06 NOTE — Patient Instructions (Signed)
Diabetes Mellitus and Foot Care Foot care is an important part of your health, especially when you have diabetes. Diabetes may cause you to have problems because of poor blood flow (circulation) to your feet and legs, which can cause your skin to:  Become thinner and drier.  Break more easily.  Heal more slowly.  Peel and crack. You may also have nerve damage (neuropathy) in your legs and feet, causing decreased feeling in them. This means that you may not notice minor injuries to your feet that could lead to more serious problems. Noticing and addressing any potential problems early is the best way to prevent future foot problems. How to care for your feet Foot hygiene  Wash your feet daily with warm water and mild soap. Do not use hot water. Then, pat your feet and the areas between your toes until they are completely dry. Do not soak your feet as this can dry your skin.  Trim your toenails straight across. Do not dig under them or around the cuticle. File the edges of your nails with an emery board or nail file.  Apply a moisturizing lotion or petroleum jelly to the skin on your feet and to dry, brittle toenails. Use lotion that does not contain alcohol and is unscented. Do not apply lotion between your toes. Shoes and socks  Wear clean socks or stockings every day. Make sure they are not too tight. Do not wear knee-high stockings since they may decrease blood flow to your legs.  Wear shoes that fit properly and have enough cushioning. Always look in your shoes before you put them on to be sure there are no objects inside.  To break in new shoes, wear them for just a few hours a day. This prevents injuries on your feet. Wounds, scrapes, corns, and calluses  Check your feet daily for blisters, cuts, bruises, sores, and redness. If you cannot see the bottom of your feet, use a mirror or ask someone for help.  Do not cut corns or calluses or try to remove them with medicine.  If you  find a minor scrape, cut, or break in the skin on your feet, keep it and the skin around it clean and dry. You may clean these areas with mild soap and water. Do not clean the area with peroxide, alcohol, or iodine.  If you have a wound, scrape, corn, or callus on your foot, look at it several times a day to make sure it is healing and not infected. Check for: ? Redness, swelling, or pain. ? Fluid or blood. ? Warmth. ? Pus or a bad smell. General instructions  Do not cross your legs. This may decrease blood flow to your feet.  Do not use heating pads or hot water bottles on your feet. They may burn your skin. If you have lost feeling in your feet or legs, you may not know this is happening until it is too late.  Protect your feet from hot and cold by wearing shoes, such as at the beach or on hot pavement.  Schedule a complete foot exam at least once a year (annually) or more often if you have foot problems. If you have foot problems, report any cuts, sores, or bruises to your health care provider immediately. Contact a health care provider if:  You have a medical condition that increases your risk of infection and you have any cuts, sores, or bruises on your feet.  You have an injury that is not   healing.  You have redness on your legs or feet.  You feel burning or tingling in your legs or feet.  You have pain or cramps in your legs and feet.  Your legs or feet are numb.  Your feet always feel cold.  You have pain around a toenail. Get help right away if:  You have a wound, scrape, corn, or callus on your foot and: ? You have pain, swelling, or redness that gets worse. ? You have fluid or blood coming from the wound, scrape, corn, or callus. ? Your wound, scrape, corn, or callus feels warm to the touch. ? You have pus or a bad smell coming from the wound, scrape, corn, or callus. ? You have a fever. ? You have a red line going up your leg. Summary  Check your feet every day  for cuts, sores, red spots, swelling, and blisters.  Moisturize feet and legs daily.  Wear shoes that fit properly and have enough cushioning.  If you have foot problems, report any cuts, sores, or bruises to your health care provider immediately.  Schedule a complete foot exam at least once a year (annually) or more often if you have foot problems. This information is not intended to replace advice given to you by your health care provider. Make sure you discuss any questions you have with your health care provider. Document Released: 07/07/2000 Document Revised: 08/22/2017 Document Reviewed: 08/11/2016 [employer] Patient Education  2020 [employer]  Corns and Calluses Corns are small areas of thickened skin that occur on the top, sides, or tip of a toe. They contain a cone-shaped core with a point that can press on a nerve below. This causes pain.  Calluses are areas of thickened skin that can occur anywhere on the body, including the hands, fingers, palms, soles of the feet, and heels. Calluses are usually larger than corns. What are the causes? Corns and calluses are caused by rubbing (friction) or pressure, such as from shoes that are too tight or do not fit properly. What increases the risk? Corns are more likely to develop in people who have misshapen toes (toe deformities), such as hammer toes. Calluses can occur with friction to any area of the skin. They are more likely to develop in people who:  Work with their hands.  Wear shoes that fit poorly, are too tight, or are high-heeled.  Have toe deformities. What are the signs or symptoms? Symptoms of a corn or callus include:  A hard growth on the skin.  Pain or tenderness under the skin.  Redness and swelling.  Increased discomfort while wearing tight-fitting shoes, if your feet are affected. If a corn or callus becomes infected, symptoms may include:  Redness and swelling that gets worse.  Pain.  Fluid, blood, or  pus draining from the corn or callus. How is this diagnosed? Corns and calluses may be diagnosed based on your symptoms, your medical history, and a physical exam. How is this treated? Treatment for corns and calluses may include:  Removing the cause of the friction or pressure. This may involve: ? Changing your shoes. ? Wearing shoe inserts (orthotics) or other protective layers in your shoes, such as a corn pad. ? Wearing gloves.  Applying medicine to the skin (topical medicine) to help soften skin in the hardened, thickened areas.  Removing layers of dead skin with a file to reduce the size of the corn or callus.  Removing the corn or callus with a scalpel or   laser.  Taking antibiotic medicines, if your corn or callus is infected.  Having surgery, if a toe deformity is the cause. Follow these instructions at home:   Take over-the-counter and prescription medicines only as told by your health care provider.  If you were prescribed an antibiotic, take it as told by your health care provider. Do not stop taking it even if your condition starts to improve.  Wear shoes that fit well. Avoid wearing high-heeled shoes and shoes that are too tight or too loose.  Wear any padding, protective layers, gloves, or orthotics as told by your health care provider.  Soak your hands or feet and then use a file or pumice stone to soften your corn or callus. Do this as told by your health care provider.  Check your corn or callus every day for symptoms of infection. Contact a health care provider if you:  Notice that your symptoms do not improve with treatment.  Have redness or swelling that gets worse.  Notice that your corn or callus becomes painful.  Have fluid, blood, or pus coming from your corn or callus.  Have new symptoms. Summary  Corns are small areas of thickened skin that occur on the top, sides, or tip of a toe.  Calluses are areas of thickened skin that can occur anywhere  on the body, including the hands, fingers, palms, and soles of the feet. Calluses are usually larger than corns.  Corns and calluses are caused by rubbing (friction) or pressure, such as from shoes that are too tight or do not fit properly.  Treatment may include wearing any padding, protective layers, gloves, or orthotics as told by your health care provider. This information is not intended to replace advice given to you by your health care provider. Make sure you discuss any questions you have with your health care provider. Document Released: 04/15/2004 Document Revised: 10/30/2018 Document Reviewed: 05/23/2017 [employer] Patient Education  2020 [employer]  Onychomycosis/Fungal Toenails  WHAT IS IT? An infection that lies within the keratin of your nail plate that is caused by a fungus.  WHY ME? Fungal infections affect all ages, sexes, races, and creeds.  There may be many factors that predispose you to a fungal infection such as age, coexisting medical conditions such as diabetes, or an autoimmune disease; stress, medications, fatigue, genetics, etc.  Bottom line: fungus thrives in a warm, moist environment and your shoes offer such a location.  IS IT CONTAGIOUS? Theoretically, yes.  You do not want to share shoes, nail clippers or files with someone who has fungal toenails.  Walking around barefoot in the same room or sleeping in the same bed is unlikely to transfer the organism.  It is important to realize, however, that fungus can spread easily from one nail to the next on the same foot.  HOW DO WE TREAT THIS?  There are several ways to treat this condition.  Treatment may depend on many factors such as age, medications, pregnancy, liver and kidney conditions, etc.  It is best to ask your doctor which options are available to you.  1. No treatment.   Unlike many other medical concerns, you can live with this condition.  However for many people this can be a painful condition and may  lead to ingrown toenails or a bacterial infection.  It is recommended that you keep the nails cut short to help reduce the amount of fungal nail. 2. Topical treatment.  These range from herbal remedies to prescription   strength nail lacquers.  About 40-50% effective, topicals require twice daily application for approximately 9 to 12 months or until an entirely new nail has grown out.  The most effective topicals are medical grade medications available through physicians offices. 3. Oral antifungal medications.  With an 80-90% cure rate, the most common oral medication requires 3 to 4 months of therapy and stays in your system for a year as the new nail grows out.  Oral antifungal medications do require blood work to make sure it is a safe drug for you.  A liver function panel will be performed prior to starting the medication and after the first month of treatment.  It is important to have the blood work performed to avoid any harmful side effects.  In general, this medication safe but blood work is required. 4. Laser Therapy.  This treatment is performed by applying a specialized laser to the affected nail plate.  This therapy is noninvasive, fast, and non-painful.  It is not covered by insurance and is therefore, out of pocket.  The results have been very good with a 80-95% cure rate.  [facility] is the only practice in the area to offer this therapy. 5. Permanent Nail Avulsion.  Removing the entire nail so that a new nail will not grow back. 

## 2019-05-07 ENCOUNTER — Ambulatory Visit: Payer: Medicare Other | Admitting: Adult Health

## 2019-05-09 ENCOUNTER — Emergency Department (HOSPITAL_COMMUNITY): Payer: Medicare Other

## 2019-05-09 ENCOUNTER — Emergency Department (HOSPITAL_COMMUNITY)
Admission: EM | Admit: 2019-05-09 | Discharge: 2019-05-09 | Disposition: A | Discharge status: Home / Self Care | Payer: Medicare Other | Attending: Emergency Medicine | Admitting: Emergency Medicine

## 2019-05-09 DIAGNOSIS — I130 Hypertensive heart and chronic kidney disease with heart failure and stage 1 through stage 4 chronic kidney disease, or unspecified chronic kidney disease: Secondary | ICD-10-CM | POA: Insufficient documentation

## 2019-05-09 DIAGNOSIS — Z8673 Personal history of transient ischemic attack (TIA), and cerebral infarction without residual deficits: Secondary | ICD-10-CM | POA: Insufficient documentation

## 2019-05-09 DIAGNOSIS — I10 Essential (primary) hypertension: Secondary | ICD-10-CM | POA: Diagnosis not present

## 2019-05-09 DIAGNOSIS — Z87891 Personal history of nicotine dependence: Secondary | ICD-10-CM | POA: Insufficient documentation

## 2019-05-09 DIAGNOSIS — Y999 Unspecified external cause status: Secondary | ICD-10-CM | POA: Insufficient documentation

## 2019-05-09 DIAGNOSIS — R519 Headache, unspecified: Secondary | ICD-10-CM | POA: Diagnosis not present

## 2019-05-09 DIAGNOSIS — W010XXA Fall on same level from slipping, tripping and stumbling without subsequent striking against object, initial encounter: Secondary | ICD-10-CM | POA: Insufficient documentation

## 2019-05-09 DIAGNOSIS — S199XXA Unspecified injury of neck, initial encounter: Secondary | ICD-10-CM | POA: Diagnosis not present

## 2019-05-09 DIAGNOSIS — Z7984 Long term (current) use of oral hypoglycemic drugs: Secondary | ICD-10-CM | POA: Insufficient documentation

## 2019-05-09 DIAGNOSIS — N182 Chronic kidney disease, stage 2 (mild): Secondary | ICD-10-CM | POA: Diagnosis not present

## 2019-05-09 DIAGNOSIS — Z79899 Other long term (current) drug therapy: Secondary | ICD-10-CM | POA: Diagnosis not present

## 2019-05-09 DIAGNOSIS — S0990XA Unspecified injury of head, initial encounter: Secondary | ICD-10-CM | POA: Diagnosis not present

## 2019-05-09 DIAGNOSIS — W19XXXA Unspecified fall, initial encounter: Secondary | ICD-10-CM

## 2019-05-09 DIAGNOSIS — Y92009 Unspecified place in unspecified non-institutional (private) residence as the place of occurrence of the external cause: Secondary | ICD-10-CM

## 2019-05-09 DIAGNOSIS — Y9389 Activity, other specified: Secondary | ICD-10-CM | POA: Insufficient documentation

## 2019-05-09 DIAGNOSIS — E1122 Type 2 diabetes mellitus with diabetic chronic kidney disease: Secondary | ICD-10-CM | POA: Insufficient documentation

## 2019-05-09 DIAGNOSIS — I5032 Chronic diastolic (congestive) heart failure: Secondary | ICD-10-CM | POA: Insufficient documentation

## 2019-05-09 DIAGNOSIS — I252 Old myocardial infarction: Secondary | ICD-10-CM | POA: Insufficient documentation

## 2019-05-09 DIAGNOSIS — M542 Cervicalgia: Secondary | ICD-10-CM | POA: Diagnosis not present

## 2019-05-09 DIAGNOSIS — Y92002 Bathroom of unspecified non-institutional (private) residence single-family (private) house as the place of occurrence of the external cause: Secondary | ICD-10-CM | POA: Insufficient documentation

## 2019-05-09 LAB — CBG MONITORING, ED: Glucose-Capillary: 128 mg/dL — ABNORMAL HIGH (ref 70–99)

## 2019-05-09 NOTE — ED Provider Notes (Signed)
[facility] EMERGENCY DEPARTMENT Provider Note   CSN: [id number] Arrival date & time: 05/09/19  1301     History   Chief Complaint Chief Complaint  Patient presents with   Fall    HPI [name] is a 58 y.o. 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  States that he is usually amatory with a walker and a cane.  He was urinating before the fall happened.  He got up when he was finished, tried to wash his hands in the sink but due to his chronic left-sided weakness felt like his legs gave out on him.  States that this usually happens when he does not use his walker or cane.  States that he hit the back of his head on a commode.  Denies any loss of consciousness.  He fully remembers the event.  He was able to crawl over to the phone and has not ambulated with his walker since then.  He reports pain at the site of the injury but denies any vision changes, changes to numbness in arms or legs, vomiting, chest pain, shortness of breath, loss of bowel or bladder function, back pain, neck pain, anticoagulant use.     HPI  Past Medical History:  Diagnosis Date   Alcohol abuse    11-15-2017  per pt last alcohol Dec 2018   BPH with obstruction/lower urinary tract symptoms    Chronic arm pain    Chronic pain    arms, leg, back   CKD (chronic kidney disease), stage II    Cocaine abuse ([place])    11-15-2017  per pt last used March 2019   Congenital hydrocephalus, unspecified ([place])    slow progression   Diabetic retinopathy of both eyes (HCC)    Diastolic CHF, chronic ([place]) 07/2017   Gait instability    multifactorial -- slow worsening congenital hydrocephalus, peripheral neuropathy, left C5-6 cord lesion   Hematuria    History of acute pulmonary edema 07/2017    History of spinal cord injury 01/02/2014   pt fell, caused spinal cord contusion at C5-6--  residual left side weakness     History of ST elevation myocardial infarction (STEMI) 12/26/2013   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   History of TIA (transient ischemic attack) and stroke    hx cva 12-20-2010 and  TIA 02-22-2011--- no residual's from cva or tia   Hyperlipidemia    Hypertension    followed by pcp   Left-sided weakness 12/2013   chronic due to spinal cord contusion   Lesion of bladder    Peripheral edema    chronic LUE   Peripheral neuropathy    Type 2 diabetes mellitus ([place])    followed by dr Karle Plumber--  last A1c 6.8 on 11-06-2017   Weak urinary stream     Patient Active Problem List   Diagnosis Date Noted   History of spinal cord injury 04/17/2019   TIA (transient ischemic attack) 03/05/2018   Benign essential HTN 03/05/2018   Sensorineural hearing loss of both ears 12/04/2017   Mild nonproliferative diabetic retinopathy ([place]) 01/10/2016   Hypertensive retinopathy of both eyes 01/10/2016   Spinal stenosis of lumbar region 07/15/2015   Abnormality of gait 06/07/2015   Hydrocephalus ([place]) 06/07/2015   Peripheral neuropathy 04/26/2015   Constipation 04/26/2015  Diabetes mellitus type 2 with complications ([place]) 51/88/4166   Decreased hearing of both ears 03/15/2015   Tinea pedis 03/15/2015   Callus of foot 05/22/2014   Cocaine abuse ([place]) 12/28/2013   Left-sided weakness 12/26/2013   Hyperlipidemia associated with type 2 diabetes mellitus ([place]) 04/01/2007   Class 2 severe obesity due to excess calories with serious comorbidity and body mass index (BMI) of 36.0 to 36.9 in adult [facility]) 03/28/2007   Essential hypertension 03/28/2007    Past Surgical History:  Procedure Laterality Date   CARDIAC CATHETERIZATION  12/01/2008   dr hochrein   abnormal stress myoview:  mild coronary plaque, normal LVF   CARDIAC CATHETERIZATION   04/29/2011   dr [place]   in setting ECG with new ST elevation & severe hypertensive:  nonobstructive atherosclerotic CAD, normal LVF (30% mRCA)    CARDIOVASCULAR STRESS TEST  08/15/2010   Low nuclear study w/ no evidence ishemia/  ef 42% with lateral and apical hypokinesis   CYSTOSCOPY WITH BIOPSY N/A 11/20/2017   Procedure: CYSTOSCOPY WITH BIOPSY AND FULGURATION;  Surgeon: [name], MD;  Location: [facility];  Service: Urology;  Laterality: N/A;   INCISION AND DRAINAGE RIGHT DISTAL MEDIAL THIGH HEMATOMA  04-14-2005   dr Ninfa Linden   left arm skin graft  1976   injury   LEFT HEART CATHETERIZATION WITH CORONARY ANGIOGRAM Bilateral 12/26/2013   Procedure: LEFT HEART CATHETERIZATION WITH CORONARY ANGIOGRAM;  Surgeon: Burnell Blanks, MD;  Location: [facility] CATH LAB;  Service: Cardiovascular;  Laterality: Bilateral;  STEMI, in setting cocaine/ alcohol :  mid disease in the RCA (20%), moderate disease in the small caliber intermediate branch (distal 50-60%), normal LVSF (ef 55-60%)   TOOTH EXTRACTION N/A 05/16/2018   Procedure: DENTAL RESTORATION/EXTRACTIONS WITH ALVEO;  Surgeon: Diona Browner, DDS;  Location: [facility];  Service: Oral Surgery;  Laterality: N/A;   TRANSTHORACIC ECHOCARDIOGRAM  08/05/2017   moderate LVH,  ef 55%,  grade 1 diastolic dysfunction/  mild LAE/  trivial Tr        Home Medications    Prior to Admission medications   Medication Sig Start Date End Date Taking? Authorizing Provider  ACETAMINOPHEN EXTRA STRENGTH 500 MG tablet TAKE 1 TABLET (500 MG TOTAL) BY MOUTH TWO (TWO) TIMES DAILY AS NEEDED. 04/02/19   Ladell Pier, MD  atorvastatin (LIPITOR) 40 MG tablet Take 1.5 tablets (60 mg total) by mouth daily. 04/20/19   Ladell Pier, MD  betamethasone dipropionate 0.05 % lotion  08/21/18   [provider]  Blood Glucose Monitoring Suppl (TRUE METRIX METER) w/Device KIT Use as directed 01/17/18   Ladell Pier, MD    dapagliflozin propanediol (FARXIGA) 5 MG TABS tablet Take 5 mg by mouth daily before breakfast. 04/17/19   Ladell Pier, MD  furosemide (LASIX) 40 MG tablet TAKE ONE TABLET BY MOUTH DAILY . TAKE AN EXTRA TABLET FOR TWO DAYS AS NEEDED FOR INCREASE LOWER EXTREMITY SWELLING 04/02/19   Ladell Pier, MD  gabapentin (NEURONTIN) 100 MG capsule Take 2 capsules (200 mg total) by mouth 2 (two) times daily. 01/02/19   Ladell Pier, MD  glipiZIDE (GLUCOTROL XL) 10 MG 24 hr tablet Take 1 tablet (10 mg total) by mouth daily with breakfast. 04/17/19   Ladell Pier, MD  glucose blood (TRUE METRIX BLOOD GLUCOSE TEST) test strip Check blood sugars 1-2 times daily 04/17/19   Ladell Pier, MD  glucose blood (TRUE METRIX BLOOD GLUCOSE TEST) test strip Test  BS twice a day before meals 04/17/19   Ladell Pier, MD  hydrALAZINE (APRESOLINE) 10 MG tablet TAKE ONE TABLET BY MOUTH TWICE A DAY 04/02/19   Ladell Pier, MD  labetalol (NORMODYNE) 100 MG tablet TAKE ONE TABLET BY MOUTH TWICE A DAY 04/17/19   Ladell Pier, MD  Lancets Misc. (ACCU-CHEK SOFTCLIX LANCET DEV) KIT Use as directed 12/19/17   Ladell Pier, MD  lisinopril (ZESTRIL) 40 MG tablet TAKE ONE TABLET BY MOUTH DAILY 04/17/19   Ladell Pier, MD  loratadine (CLARITIN) 10 MG tablet Take 1 tablet (10 mg total) by mouth daily as needed for allergies. 01/27/19   Ladell Pier, MD  metFORMIN (GLUCOPHAGE) 1000 MG tablet Take 1 tablet (1,000 mg total) by mouth 2 (two) times daily with a meal. 04/17/19   Ladell Pier, MD  triamcinolone cream (KENALOG) 0.1 % APPLY 1 APPLICATION TOPICALLY TWO (TWO) TIMES DAILY. 08/16/18   Ladell Pier, MD  TRUEPLUS LANCETS 28G MISC Use as directed 01/17/18   Ladell Pier, MD    Family History Family History  Problem Relation Age of Onset   Hypertension Mother    Diabetes Mother    Cancer Mother        breast cancer    Hypertension Father    Heart disease Father     Stroke Father    Hypertension Sister     Social History Social History   Tobacco Use   Smoking status: Former Smoker    Years: 45.00    Types: Cigarettes    Quit date: 03/03/2018    Years since quitting: 1.1   Smokeless tobacco: Never Used   Tobacco comment: 11-15-2017  per pt 1pp3days  Substance Use Topics   Alcohol use: Not Currently    Alcohol/week: 0.0 standard drinks    Comment: 11-15-2017  per pt last alcohol 12/ 2018  alcohol abuse   Drug use: Not Currently    Types: "Crack" cocaine, Cocaine    Comment: 11-15-2017  per pt last used March 2019     Allergies   Amlodipine   Review of Systems Review of Systems  Constitutional: Negative for appetite change, chills and fever.  HENT: Negative for ear pain, rhinorrhea, sneezing and sore throat.   Eyes: Negative for photophobia and visual disturbance.  Respiratory: Negative for cough, chest tightness, shortness of breath and wheezing.   Cardiovascular: Negative for chest pain and palpitations.  Gastrointestinal: Negative for abdominal pain, blood in stool, constipation, diarrhea, nausea and vomiting.  Genitourinary: Negative for dysuria, hematuria and urgency.  Musculoskeletal: Negative for myalgias.  Skin: Negative for rash.  Neurological: Positive for weakness (chronic L sided) and headaches. Negative for dizziness and light-headedness.     Physical Exam Updated Vital Signs BP (!) 176/96    Pulse 72    Temp (!) 97.5 F (36.4 C) (Oral)    Resp 18    SpO2 100%   Physical Exam Vitals signs and nursing note reviewed.  Constitutional:      General: He is not in acute distress.    Appearance: He is well-developed.  HENT:     Head: Normocephalic and atraumatic.     Nose: Nose normal.  Eyes:     General: No scleral icterus.       Right eye: No discharge.        Left eye: No discharge.     Conjunctiva/sclera: Conjunctivae normal.     Pupils: Pupils are equal, round, and reactive  to light.  Neck:      Musculoskeletal: Normal range of motion and neck supple.  Cardiovascular:     Rate and Rhythm: Normal rate and regular rhythm.     Heart sounds: Normal heart sounds. No murmur. No friction rub. No gallop.   Pulmonary:     Effort: Pulmonary effort is normal. No respiratory distress.     Breath sounds: Normal breath sounds.  Abdominal:     General: Bowel sounds are normal. There is no distension.     Palpations: Abdomen is soft.     Tenderness: There is no abdominal tenderness. There is no guarding.  Musculoskeletal: Normal range of motion.     Comments: No midline spinal tenderness present in lumbar, thoracic or cervical spine. No step-off palpated. No visible bruising, edema or temperature change noted. No objective signs of numbness present. No saddle anesthesia. 2+ DP pulses bilaterally.   Skin:    General: Skin is warm and dry.     Findings: No rash.  Neurological:     General: No focal deficit present.     Mental Status: He is alert and oriented to person, place, and time.     Cranial Nerves: No cranial nerve deficit.     Sensory: No sensory deficit.     Motor: No weakness or abnormal muscle tone.     Coordination: Coordination normal.     Comments: Alert and oriented x4. Pupils reactive. No facial asymmetry noted. Cranial nerves appear grossly intact. Sensation intact to light touch on face, BUE and BLE. Strength 4/5 in LLE, 5/5 in RLE, 5/5 in BUE.      ED Treatments / Results  Labs (all labs ordered are listed, but only abnormal results are displayed) Labs Reviewed  CBG MONITORING, ED - Abnormal; Notable for the following components:      Result Value   Glucose-Capillary 128 (*)    All other components within normal limits    EKG None  Radiology Ct Head Wo Contrast  Result Date: 05/09/2019 CLINICAL DATA:  Pain after fall. EXAM: CT HEAD WITHOUT CONTRAST CT CERVICAL SPINE WITHOUT CONTRAST TECHNIQUE: Multidetector CT imaging of the head and cervical spine was performed  following the standard protocol without intravenous contrast. Multiplanar CT image reconstructions of the cervical spine were also generated. COMPARISON:  March 04, 2018 FINDINGS: CT HEAD FINDINGS Brain: Ventricles and sulci remain prominent. The ventricles in particular are prominent but stable. Cerebellum, brainstem, and basal cisterns are normal. No mass effect or midline shift. No subdural, epidural, or subarachnoid hemorrhage. No cortical ischemia or infarct. Vascular: Calcified atherosclerosis is seen in the intracranial carotids. Skull: Normal. Negative for fracture or focal lesion. Sinuses/Orbits: No acute finding. Other: Subcutaneous nodularity is stable. CT CERVICAL SPINE FINDINGS Alignment: 3 mm of anterolisthesis of C3 versus C4. No other malalignment. Skull base and vertebrae: No acute fracture. No primary bone lesion or focal pathologic process. Soft tissues and spinal canal: No prevertebral fluid or swelling. No visible canal hematoma. Disc levels: Multilevel degenerative changes most marked at C5-6, C6-7, and C7-T1 with anterior osteophytes. Upper chest: Negative. Other: No other abnormalities. IMPRESSION: 1. No acute intracranial abnormalities. Chronic prominence of the sulci and ventricles, particularly the ventricles. 2. 3 mm anterolisthesis of C3 versus C4 without associated soft tissue thickening is favored to be degenerative. No other malalignment. No fractures identified. Electronically Signed   By: Dorise Bullion III M.D   On: 05/09/2019 19:35   Ct Cervical Spine Wo Contrast  Result Date: 05/09/2019  CLINICAL DATA:  Pain after fall. EXAM: CT HEAD WITHOUT CONTRAST CT CERVICAL SPINE WITHOUT CONTRAST TECHNIQUE: Multidetector CT imaging of the head and cervical spine was performed following the standard protocol without intravenous contrast. Multiplanar CT image reconstructions of the cervical spine were also generated. COMPARISON:  March 04, 2018 FINDINGS: CT HEAD FINDINGS Brain:  Ventricles and sulci remain prominent. The ventricles in particular are prominent but stable. Cerebellum, brainstem, and basal cisterns are normal. No mass effect or midline shift. No subdural, epidural, or subarachnoid hemorrhage. No cortical ischemia or infarct. Vascular: Calcified atherosclerosis is seen in the intracranial carotids. Skull: Normal. Negative for fracture or focal lesion. Sinuses/Orbits: No acute finding. Other: Subcutaneous nodularity is stable. CT CERVICAL SPINE FINDINGS Alignment: 3 mm of anterolisthesis of C3 versus C4. No other malalignment. Skull base and vertebrae: No acute fracture. No primary bone lesion or focal pathologic process. Soft tissues and spinal canal: No prevertebral fluid or swelling. No visible canal hematoma. Disc levels: Multilevel degenerative changes most marked at C5-6, C6-7, and C7-T1 with anterior osteophytes. Upper chest: Negative. Other: No other abnormalities. IMPRESSION: 1. No acute intracranial abnormalities. Chronic prominence of the sulci and ventricles, particularly the ventricles. 2. 3 mm anterolisthesis of C3 versus C4 without associated soft tissue thickening is favored to be degenerative. No other malalignment. No fractures identified. Electronically Signed   By: Dorise Bullion III M.D   On: 05/09/2019 19:35    Procedures Procedures (including critical care time)  Medications Ordered in ED Medications - No data to display   Initial Impression / Assessment and Plan / ED Course  I have reviewed the triage vital signs and the nursing notes.  Pertinent labs & imaging results that were available during my care of the patient were reviewed by me and considered in my medical decision making (see chart for details).        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  He was in the bathroom and was unable to get his walker when he went from the commode to the sink to wash his hands after urinating.  He hit the back of his head on, but denies any loss of consciousness.  Reports headache at the site of the injury, denies any vision changes, vomiting, changes to numbness in arms or legs, chest pain, shortness of breath or memory changes.  On exam patient is overall well-appearing.  No deficits neurological exam, left lower extremity is slightly weaker but patient states that is baseline.  He denies any change sensation and notes changes due to his neuropathy.  No wounds noted.  He is alert and oriented x4.  CT of the head and neck is negative for acute abnormality.  Patient without changes during his ED visit.  He feels comfortable for discharge home.  We will have him follow-up with PCP and return for worsening symptoms, give concussion precautions.  Patient is hemodynamically stable, in NAD, and able to ambulate in the ED. Evaluation does not show pathology that would require ongoing emergent intervention or inpatient treatment. I explained the diagnosis to the patient. Pain has been managed and has no complaints prior to discharge. Patient is comfortable with above plan and is stable for discharge at this time. All questions were answered prior to disposition. Strict return precautions for returning to the ED were discussed. Encouraged  follow up with PCP.   An After Visit Summary was printed and given to the patient.   Portions of this note were generated with [employer]. Dictation errors may occur despite best attempts at proofreading.   Final Clinical Impressions(s) / ED Diagnoses   Final diagnoses:  Fall in home, initial encounter  Injury of head, initial encounter    ED Discharge Orders    None       Delia Heady, PA-C 05/09/19 2006    [address], DO 05/09/19 2029

## 2019-05-09 NOTE — ED Triage Notes (Signed)
Pt was at home had gone to br w/o his walker and fell hit his head on commode , no loc is not on any thinners states legs just gave way , states weakness is normal and did not have his walker

## 2019-05-09 NOTE — Discharge Instructions (Addendum)
Continue your home medications as previously prescribed. Return to the ED if you start to have worsening symptoms, additional injuries or falls, worsening numbness in arms or legs, worsening weakness, blurry vision.

## 2019-05-11 NOTE — Progress Notes (Signed)
Subjective: [name] presents to clinic with cc of painful mycotic toenails and calluses and elongated, mycotic toenails b/l feet which are aggravated when weightbearing with and without shoe gear.  This pain limits his daily activities. Pain symptoms resolve with periodic professional debridement.  Ladell Pier, MD is his PCP.   Current Outpatient Medications on File Prior to Visit  Medication Sig Dispense Refill  . ACETAMINOPHEN EXTRA STRENGTH 500 MG tablet TAKE 1 TABLET (500 MG TOTAL) BY MOUTH TWO (TWO) TIMES DAILY AS NEEDED. 60 tablet 1  . atorvastatin (LIPITOR) 40 MG tablet Take 1.5 tablets (60 mg total) by mouth daily. 45 tablet 6  . betamethasone dipropionate 0.05 % lotion     . Blood Glucose Monitoring Suppl (TRUE METRIX METER) w/Device KIT Use as directed 1 kit 0  . dapagliflozin propanediol (FARXIGA) 5 MG TABS tablet Take 5 mg by mouth daily before breakfast. 30 tablet 5  . furosemide (LASIX) 40 MG tablet TAKE ONE TABLET BY MOUTH DAILY . TAKE AN EXTRA TABLET FOR TWO DAYS AS NEEDED FOR INCREASE LOWER EXTREMITY SWELLING 40 tablet 2  . gabapentin (NEURONTIN) 100 MG capsule Take 2 capsules (200 mg total) by mouth 2 (two) times daily. 120 capsule 6  . glipiZIDE (GLUCOTROL XL) 10 MG 24 hr tablet Take 1 tablet (10 mg total) by mouth daily with breakfast. 30 tablet 6  . glucose blood (TRUE METRIX BLOOD GLUCOSE TEST) test strip Check blood sugars 1-2 times daily 100 each 12  . glucose blood (TRUE METRIX BLOOD GLUCOSE TEST) test strip Test BS twice a day before meals 100 each 12  . hydrALAZINE (APRESOLINE) 10 MG tablet TAKE ONE TABLET BY MOUTH TWICE A DAY 60 tablet 2  . labetalol (NORMODYNE) 100 MG tablet TAKE ONE TABLET BY MOUTH TWICE A DAY 180 tablet 0  . Lancets Misc. (ACCU-CHEK SOFTCLIX LANCET DEV) KIT Use as directed 1 kit 0  . lisinopril (ZESTRIL) 40 MG tablet TAKE ONE TABLET BY MOUTH DAILY 90 tablet 0  . loratadine (CLARITIN) 10 MG tablet Take 1 tablet (10 mg total) by mouth  daily as needed for allergies. 30 tablet 3  . metFORMIN (GLUCOPHAGE) 1000 MG tablet Take 1 tablet (1,000 mg total) by mouth 2 (two) times daily with a meal. 180 tablet 3  . triamcinolone cream (KENALOG) 0.1 % APPLY 1 APPLICATION TOPICALLY TWO (TWO) TIMES DAILY. 45 g 0  . TRUEPLUS LANCETS 28G MISC Use as directed 100 each 6   No current facility-administered medications on file prior to visit.      Allergies  Allergen Reactions  . Amlodipine Swelling    Lower extremity swelling     Objective: There were no vitals filed for this visit.  Physical Examination:  Vascular  Examination: Capillary refill time <3 seconds b/l.  Faintly palpable DP/PT pulses b/l.  Digital hair absent b/l.  No edema noted b/l.  Skin temperature gradient WNL b/l.  Dermatological Examination: Skin with normal turgor, texture and tone b/l.  No open wounds b/l.  No interdigital macerations noted b/l.  Elongated, thick, discolored brittle toenails with subungual debris and pain on dorsal palpation of nailbeds 1-5 b/l.  Hyperkeratotic lesion submet head 4 left foot and sub hallux left foot with tenderness to palpation. No edema, no erythema, no drainage, no flocculence.   Musculoskeletal Examination: Muscle strength 5/5 to all muscle groups b/l.  HAV with bunion b/l. Hammertoe deformity 2-5 b/l.   No pain, crepitus or joint discomfort with active/passive ROM.  Neurological  Examination: Sensation diminished 5/5 b/l with 10 gram monofilament.  Assessment: 1. Mycotic nail infection with pain 1-5 b/l 2. Calluses submet head 4 left foot and sub hallux left foot 3. NIDDM with neuropathy  Plan: 1. Toenails 1-5 b/l were debrided in length and girth without iatrogenic laceration. 2. Calluses pared submetatarsal head(s) 4 left foot and sub hallux left foot utilizing sterile scalpel blade without incident. 3. Continue diabetic shoe gear daily. 4. Report any pedal injuries to medical  professional. 5. Follow up 9 weeks. 6. Patient/POA to call should there be a question/concern in there interim.

## 2019-05-13 ENCOUNTER — Ambulatory Visit: Payer: Medicare Other | Admitting: Orthotics

## 2019-05-13 ENCOUNTER — Other Ambulatory Visit: Payer: Self-pay

## 2019-05-13 DIAGNOSIS — M79674 Pain in right toe(s): Secondary | ICD-10-CM

## 2019-05-13 DIAGNOSIS — B351 Tinea unguium: Secondary | ICD-10-CM

## 2019-05-13 DIAGNOSIS — L84 Corns and callosities: Secondary | ICD-10-CM

## 2019-05-13 NOTE — Progress Notes (Signed)
Offloaded 1st toe/met head, as well as 4th on left to take pressure off painful callus.

## 2019-05-19 ENCOUNTER — Encounter: Payer: Self-pay | Admitting: Adult Health

## 2019-05-19 ENCOUNTER — Other Ambulatory Visit: Payer: Self-pay | Admitting: Internal Medicine

## 2019-05-19 ENCOUNTER — Telehealth: Payer: Medicare Other | Admitting: Adult Health

## 2019-05-19 DIAGNOSIS — E1142 Type 2 diabetes mellitus with diabetic polyneuropathy: Secondary | ICD-10-CM

## 2019-05-19 DIAGNOSIS — IMO0002 Reserved for concepts with insufficient information to code with codable children: Secondary | ICD-10-CM

## 2019-05-19 DIAGNOSIS — J309 Allergic rhinitis, unspecified: Secondary | ICD-10-CM

## 2019-05-19 NOTE — Progress Notes (Deleted)
[facility] [address] [place]. [place] [address] [phone]       OFFICE FOLLOW UP NOTE  [name] Date of Birth:  20-Oct-1960 Medical Record Number:  [id number]   Reason for Referral:  hospital stroke follow up  CHIEF COMPLAINT:  No chief complaint on file.   HPI:   Update 05/19/2019: [name] is a 57 year old male who is being seen today for follow-up regarding TIA in 02/2018 as well as complaints at prior visit regarding LLE paresthesias.  He underwent EMG/NCV on 03/26/2019 which showed neuropathy in bilateral lower extremities likely secondary to diabetes as well as abnormality within left leg likely secondary to underlying neuropathy or from prior spinal surgery in 2016.  He did participate in PT which was beneficial.  Continues to ambulate with 4-prong cane.  He did unfortunately sustain a fall with ED admission on 05/09/2019 but fortunately was not found to have any injuries.  He has not had any reoccurring or new stroke/TIA symptoms.  He has continued on atorvastatin 60 mg daily without myalgias.  Recent lipid panel by PCP showed LDL 94 and triglycerides 174.  Continues to follow with PCP for DM management with recent A1c 9.3.  Recommended at prior visit to restart aspirin ***.  Blood pressure today ***.  No further concerns at this time.   Update 02/04/2019: [name] is being seen today for follow up regarding TIA vs conversion disorder on 03/04/18.  Stable from stroke standpoint without new or recurring stroke/TIA symptoms.  He continues on atorvastatin 40 mg daily but at some point discontinued aspirin 81 mg as he states he was told by his PCP he no longer needed this medication for undetermined reasons.  He denies any complication with bleeding or bruising.  Blood pressure satisfactory at 118/67. He has been followed by his PCP for complaints of LLE pain consisting of numbness/tingling over the past 2 months.  He previously experienced intermittent  neuropathy but has now been experiencing this constantly which is worse distally when standing but will radiate through the back of his calf and thigh.  Denies back or hip pain.  PCP initiated gabapentin twice daily along with home physical therapy. He does endorse improvement with use of home PT when she has been participating in over the past month. 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  Denies any worsening weakness.  He continues to use a cane for ambulation.  Denies any recent falls.   No further concerns at this time.     Initial visit 04/26/18: [name] is a 58 y.o. male with history of HTN, HLD, DB, obesity, CAD/STEMI, CHF, alcohol use, cocaine abuse, CKD, congenital hydrocephalus, traumatic spinal cord contusion in 12/2013 with resultant L HP (no documented stroke) who presented to [facility] on 03/04/2018 with R sided weakness.  CT had reviewed and was negative for acute abnormalities but did show evidence of small vessel disease.  MRI head reviewed and was negative for acute stroke.  MRA was negative for emergent LVO but did show some narrowing in the left ICA which was unchanged from prior imaging in 2016.  Carotid Doppler showed bilateral ICA stenosis of 1 to 39% with VAs antegrade.  2D echo showed an EF of 65 to 70% without evidence of cardiac source of embolus.  LDL 98 and recommended increase from Lipitor 40 mg to Lipitor 80 mg daily.  HTN stable during admission and recommended long-term BP goal normotensive range.  A1c 7.0 and recommended continue follow-up with PCP for DM management.  Current smoker and recommended smoking cessation with counseling provided during admission.  Per notes, current condition concern for TIA versus conversion disorder as MRI negative and previous diagnosis of conversion disorder.  Patient was not compliant with aspirin 81 mg PTA and recommended to restart aspirin 81 mg daily with importance of compliance.  Patient was  discharged home in stable condition with recommendations of home health PT/OT.  Patient is being seen today for hospital follow-up.  He has since completed home therapies and has resumed back to all prior activities.  He states all prior right-sided weakness has resolved but continues to have some left hemiparesis which is chronic.  He states compliance with aspirin 81 mg and denies any bleeding or bruising.  Continues to take increased dose of Lipitor without side effects myalgias.  Blood pressure today satisfactory 129/74.  Does monitor glucose levels at home and typically range from 100-1 50.  He does live at home independently but has an aide come in daily to assist with ADLs and IADLs.  He uses a cane for ambulation due to chronic left hemiparesis.  Denies current tobacco use or recreational drug use.  Denies new or worsening stroke/TIA symptoms. On a side note, patient has complaints of bilateral upper extremity and back of head "rash" which she states has been present for approximately 2 months and was seen by his PCP in regards to this and per patient, was told that it was a "summer rash".  He does endorse pruritus without improvement.    ROS:   14 system review of systems performed and negative with exception of numbness/tingling, weakness and pain  PMH:  Past Medical History:  Diagnosis Date   Alcohol abuse    11-15-2017  per pt last alcohol Dec 2018   BPH with obstruction/lower urinary tract symptoms    Chronic arm pain    Chronic pain    arms, leg, back   CKD (chronic kidney disease), stage II    Cocaine abuse ([place])    11-15-2017  per pt last used March 2019   Congenital hydrocephalus, unspecified ([place])    slow progression   Diabetic retinopathy of both eyes (HCC)    Diastolic CHF, chronic ([place]) 07/2017   Gait instability    multifactorial -- slow worsening congenital hydrocephalus, peripheral neuropathy, left C5-6 cord lesion   Hematuria    History of acute  pulmonary edema 07/2017   History of spinal cord injury 01/02/2014   pt fell, caused spinal cord contusion at C5-6--  residual left side weakness     History of ST elevation myocardial infarction (STEMI) 12/26/2013   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   History of TIA (transient ischemic attack) and stroke    hx cva 12-20-2010 and  TIA 02-22-2011--- no residual's from cva or tia   Hyperlipidemia    Hypertension    followed by pcp   Left-sided weakness 12/2013   chronic due to spinal cord contusion   Lesion of bladder    Peripheral edema    chronic LUE   Peripheral neuropathy    Type 2 diabetes mellitus ([place])    followed by dr Karle Plumber--  last A1c 6.8 on 11-06-2017   Weak urinary stream     PSH:  Past Surgical History:  Procedure Laterality Date   CARDIAC CATHETERIZATION  12/01/2008   dr hochrein   abnormal stress myoview:  mild coronary plaque, normal LVF   CARDIAC  CATHETERIZATION  04/29/2011   dr [place]ique   in setting ECG with new ST elevation & severe hypertensive:  nonobstructive atherosclerotic CAD, normal LVF (30% mRCA)    CARDIOVASCULAR STRESS TEST  08/15/2010   Low nuclear study w/ no evidence ishemia/  ef 42% with lateral and apical hypokinesis   CYSTOSCOPY WITH BIOPSY N/A 11/20/2017   Procedure: CYSTOSCOPY WITH BIOPSY AND FULGURATION;  Surgeon: [name], MD;  Location: [facility];  Service: Urology;  Laterality: N/A;   INCISION AND DRAINAGE RIGHT DISTAL MEDIAL THIGH HEMATOMA  04-14-2005   dr Ninfa Linden   left arm skin graft  1976   injury   LEFT HEART CATHETERIZATION WITH CORONARY ANGIOGRAM Bilateral 12/26/2013   Procedure: LEFT HEART CATHETERIZATION WITH CORONARY ANGIOGRAM;  Surgeon: Burnell Blanks, MD;  Location: [facility] CATH LAB;  Service: Cardiovascular;  Laterality: Bilateral;  STEMI, in setting cocaine/ alcohol :  mid disease in the RCA (20%), moderate disease in the small caliber intermediate branch (distal 50-60%), normal  LVSF (ef 55-60%)   TOOTH EXTRACTION N/A 05/16/2018   Procedure: DENTAL RESTORATION/EXTRACTIONS WITH ALVEO;  Surgeon: Diona Browner, DDS;  Location: [facility];  Service: Oral Surgery;  Laterality: N/A;   TRANSTHORACIC ECHOCARDIOGRAM  08/05/2017   moderate LVH,  ef 55%,  grade 1 diastolic dysfunction/  mild LAE/  trivial Tr    Social History:  Social History   Socioeconomic History   Marital status: Divorced    Spouse name: Not on file   Number of children: 0   Years of education: 10   Highest education level: Not on file  Occupational History   Occupation: Unemployed  Scientist, product/process development strain: Not on file   Food insecurity    Worry: Not on file    Inability: Not on file   Transportation needs    Medical: Not on file    Non-medical: Not on file  Tobacco Use   Smoking status: Former Smoker    Years: 45.00    Types: Cigarettes    Quit date: 03/03/2018    Years since quitting: 1.2   Smokeless tobacco: Never Used   Tobacco comment: 11-15-2017  per pt 1pp3days  Substance and Sexual Activity   Alcohol use: Not Currently    Alcohol/week: 0.0 standard drinks    Comment: 11-15-2017  per pt last alcohol 12/ 2018  alcohol abuse   Drug use: Not Currently    Types: "Crack" cocaine, Cocaine    Comment: 11-15-2017  per pt last used March 2019   Sexual activity: Not Currently  Lifestyle   Physical activity    Days per week: Not on file    Minutes per session: Not on file   Stress: Not on file  Relationships   Social connections    Talks on phone: Not on file    Gets together: Not on file    Attends religious service: Not on file    Active member of club or organization: Not on file    Attends meetings of clubs or organizations: Not on file    Relationship status: Not on file   Intimate partner violence    Fear of current or ex partner: Not on file    Emotionally abused: Not on file    Physically abused: Not on file    Forced  sexual activity: Not on file  Other Topics Concern   Not on file  Social History Narrative   Lives at home alone.   Right-handed.  Occasional use.    Family History:  Family History  Problem Relation Age of Onset   Hypertension Mother    Diabetes Mother    Cancer Mother        breast cancer    Hypertension Father    Heart disease Father    Stroke Father    Hypertension Sister     Medications:   Current Outpatient Medications on File Prior to Visit  Medication Sig Dispense Refill   ACETAMINOPHEN EXTRA STRENGTH 500 MG tablet TAKE 1 TABLET (500 MG TOTAL) BY MOUTH TWO (TWO) TIMES DAILY AS NEEDED. 60 tablet 1   atorvastatin (LIPITOR) 40 MG tablet Take 1.5 tablets (60 mg total) by mouth daily. 45 tablet 6   betamethasone dipropionate 0.05 % lotion      Blood Glucose Monitoring Suppl (TRUE METRIX METER) w/Device KIT Use as directed 1 kit 0   dapagliflozin propanediol (FARXIGA) 5 MG TABS tablet Take 5 mg by mouth daily before breakfast. 30 tablet 5   furosemide (LASIX) 40 MG tablet TAKE ONE TABLET BY MOUTH DAILY . TAKE AN EXTRA TABLET FOR TWO DAYS AS NEEDED FOR INCREASE LOWER EXTREMITY SWELLING 40 tablet 2   gabapentin (NEURONTIN) 100 MG capsule Take 2 capsules (200 mg total) by mouth 2 (two) times daily. 120 capsule 6   glipiZIDE (GLUCOTROL XL) 10 MG 24 hr tablet Take 1 tablet (10 mg total) by mouth daily with breakfast. 30 tablet 6   glucose blood (TRUE METRIX BLOOD GLUCOSE TEST) test strip Check blood sugars 1-2 times daily 100 each 12   glucose blood (TRUE METRIX BLOOD GLUCOSE TEST) test strip Test BS twice a day before meals 100 each 12   hydrALAZINE (APRESOLINE) 10 MG tablet TAKE ONE TABLET BY MOUTH TWICE A DAY 60 tablet 2   labetalol (NORMODYNE) 100 MG tablet TAKE ONE TABLET BY MOUTH TWICE A DAY 180 tablet 0   Lancets Misc. (ACCU-CHEK SOFTCLIX LANCET DEV) KIT Use as directed 1 kit 0   lisinopril (ZESTRIL) 40 MG tablet TAKE ONE TABLET BY MOUTH DAILY 90  tablet 0   loratadine (CLARITIN) 10 MG tablet Take 1 tablet (10 mg total) by mouth daily as needed for allergies. 30 tablet 3   metFORMIN (GLUCOPHAGE) 1000 MG tablet Take 1 tablet (1,000 mg total) by mouth 2 (two) times daily with a meal. 180 tablet 3   triamcinolone cream (KENALOG) 0.1 % APPLY 1 APPLICATION TOPICALLY TWO (TWO) TIMES DAILY. 45 g 0   TRUEPLUS LANCETS 28G MISC Use as directed 100 each 6   No current facility-administered medications on file prior to visit.     Allergies:   Allergies  Allergen Reactions   Amlodipine Swelling    Lower extremity swelling     Physical Exam  There were no vitals filed for this visit. There is no height or weight on file to calculate BMI. No exam data present  General: well developed, well nourished, pleasant middle-aged African-American male, seated, in no evident distress Head: head normocephalic and atraumatic.   Neck: supple with no carotid or supraclavicular bruits Cardiovascular: regular rate and rhythm, no murmurs Musculoskeletal: no deformity Skin:  no rash/petichiae Vascular:  Normal pulses all extremities  Neurologic Exam Mental Status: Awake and fully alert. Oriented to place and time. Recent and remote memory intact. Attention span, concentration and fund of knowledge appropriate. Mood and affect appropriate.  Cranial Nerves: Pupils equal, briskly reactive to light. Extraocular movements full without nystagmus. Visual fields full to confrontation. Hearing intact. Facial  sensation intact. Face, tongue, palate moves normally and symmetrically.  Motor: Normal bulk and tone. Normal strength in all tested extremity muscles except for very mild chronic left hemiparesis Sensory.: Decreased pinprick sensation LLE and lack of vibratory sensation LLE distally.  Sensation intact RLE and bilateral upper extremities Coordination: Rapid alternating movements normal in all extremities. Finger-to-nose and heel-to-shin performed  accurately bilaterally. Gait and Station: Arises from chair with mild difficulty. Stance is normal. Gait demonstrates short shuffled broad-based gait with assistance of cane Reflexes: 1+ and symmetric. Toes downgoing.       Diagnostic Data (Labs, Imaging, Testing)  CT HEAD WO CONTRAST 03/04/2018 IMPRESSION: No acute intracranial abnormalities. Mild diffuse cerebral atrophy and small vessel ischemic changes. Ventricular dilatation is likely due to central atrophy.  MR BRAIN WO CONTRAST 03/05/2018 IMPRESSION: 1. No acute intracranial abnormality. 2. Stable moderate diffuse volume loss of the brain and absent septum pellucidum.  MR MRA HEAD  03/05/2018 IMPRESSION: No emergent large vessel occlusion. Narrowing of the distal cavernous segment of the left internal carotid artery is unchanged from the CTA 04/26/2015.  VAS US CAROTID DUPLEX BILATERAL 03/05/2018 Final Interpretation: Right Carotid: Velocities in the right ICA are consistent with a 1-39% stenosis Left Carotid: Velocities in the left ICA are consistent with a 1-39% stenosis. Vertebrals: Bilateral vertebral arteries demonstrate antegrade flow.  ECHOCARDIOGRAM 03/05/2018 Study Conclusions  - Left ventricle: The cavity size was mildly dilated. There was   mild concentric hypertrophy. Systolic function was vigorous. The   estimated ejection fraction was in the range of 65% to 70%. Wall   motion was normal; there were no regional wall motion   abnormalities. Features are consistent with a pseudonormal left   ventricular filling pattern, with concomitant abnormal relaxation   and increased filling pressure (grade 2 diastolic dysfunction). - Left atrium: The atrium was mildly dilated. - Pulmonary arteries: Systolic pressure could not be accurately   estimated.    ASSESSMENT: [name] is a 58 y.o. year old male here with TIA versus conversion disorder on 03/04/2018. Vascular risk factors include HTN, HLD, DM,  obesity, CAD/STEMI, CHF, alcohol use, cocaine abuse, CKD, congenital hydrocephalus, and traumatic spinal cord contusion with residual left hemiparesis.  Stable from stroke standpoint.  85-monthcomplaint of constant LLE paresthesias.    PLAN: -Recommend restarting aspirin 81 mg daily and will follow-up with PCP in regards to this recommendation for secondary stroke prevention and history of CAD/STEMI and continue Lipitor 40 mg for secondary stroke prevention -F/u with PCP regarding your HLD, HTN and DM management -Obtain EMG/NCS for further evaluation of LLE symptoms especially with decreased sensation -Continue participation in home health PT along with use of gabapentin prescribed by PCP -continue to monitor BP at home -advised to continue to stay active and maintain a healthy diet -Maintain strict control of hypertension with blood pressure goal below 130/90, diabetes with hemoglobin A1c goal below 6.5% and cholesterol with LDL cholesterol (bad cholesterol) goal below 70 mg/dL. I also advised the patient to eat a healthy diet with plenty of whole grains, cereals, fruits and vegetables, exercise regularly and maintain ideal body weight.  Follow up in 3 months or call earlier if needed   Greater than 50% of time during this 25 minute visit was spent on counseling, discussion regarding paresthesias with radiculopathy, explanation of diagnosis of TIA versus conversion disorder, reviewing risk factor management of HTN, HLD, DM, prior tobacco use and prior recreational drug use, planning of further management, discussion with patient and family  and coordination of care    Frann Rider, [facility]  [facility] [address] [place], [place] [address]  Phone [phone] Fax [phone] Note: This document was prepared with digital dictation and possible smart phrase technology. Any transcriptional errors that result from this process are unintentional.

## 2019-05-21 ENCOUNTER — Ambulatory Visit: Payer: Medicare Other | Attending: Family Medicine | Admitting: Family Medicine

## 2019-05-21 ENCOUNTER — Encounter: Payer: Self-pay | Admitting: Family Medicine

## 2019-05-21 ENCOUNTER — Other Ambulatory Visit: Payer: Self-pay

## 2019-05-21 VITALS — BP 162/78 | HR 81 | Temp 98.2°F | Ht 75.0 in | Wt 270.0 lb

## 2019-05-21 DIAGNOSIS — E118 Type 2 diabetes mellitus with unspecified complications: Secondary | ICD-10-CM | POA: Diagnosis not present

## 2019-05-21 DIAGNOSIS — Z8249 Family history of ischemic heart disease and other diseases of the circulatory system: Secondary | ICD-10-CM | POA: Insufficient documentation

## 2019-05-21 DIAGNOSIS — E1151 Type 2 diabetes mellitus with diabetic peripheral angiopathy without gangrene: Secondary | ICD-10-CM | POA: Diagnosis not present

## 2019-05-21 DIAGNOSIS — I130 Hypertensive heart and chronic kidney disease with heart failure and stage 1 through stage 4 chronic kidney disease, or unspecified chronic kidney disease: Secondary | ICD-10-CM | POA: Insufficient documentation

## 2019-05-21 DIAGNOSIS — Z79899 Other long term (current) drug therapy: Secondary | ICD-10-CM | POA: Insufficient documentation

## 2019-05-21 DIAGNOSIS — R296 Repeated falls: Secondary | ICD-10-CM | POA: Diagnosis not present

## 2019-05-21 DIAGNOSIS — M5127 Other intervertebral disc displacement, lumbosacral region: Secondary | ICD-10-CM | POA: Insufficient documentation

## 2019-05-21 DIAGNOSIS — Z888 Allergy status to other drugs, medicaments and biological substances status: Secondary | ICD-10-CM | POA: Diagnosis not present

## 2019-05-21 DIAGNOSIS — M4807 Spinal stenosis, lumbosacral region: Secondary | ICD-10-CM | POA: Insufficient documentation

## 2019-05-21 DIAGNOSIS — G8929 Other chronic pain: Secondary | ICD-10-CM | POA: Insufficient documentation

## 2019-05-21 DIAGNOSIS — I252 Old myocardial infarction: Secondary | ICD-10-CM | POA: Insufficient documentation

## 2019-05-21 DIAGNOSIS — Z833 Family history of diabetes mellitus: Secondary | ICD-10-CM | POA: Insufficient documentation

## 2019-05-21 DIAGNOSIS — I5032 Chronic diastolic (congestive) heart failure: Secondary | ICD-10-CM | POA: Diagnosis not present

## 2019-05-21 DIAGNOSIS — R531 Weakness: Secondary | ICD-10-CM | POA: Diagnosis not present

## 2019-05-21 DIAGNOSIS — E1122 Type 2 diabetes mellitus with diabetic chronic kidney disease: Secondary | ICD-10-CM | POA: Diagnosis not present

## 2019-05-21 DIAGNOSIS — F1411 Cocaine abuse, in remission: Secondary | ICD-10-CM | POA: Insufficient documentation

## 2019-05-21 DIAGNOSIS — Z8673 Personal history of transient ischemic attack (TIA), and cerebral infarction without residual deficits: Secondary | ICD-10-CM | POA: Diagnosis not present

## 2019-05-21 DIAGNOSIS — E785 Hyperlipidemia, unspecified: Secondary | ICD-10-CM | POA: Diagnosis not present

## 2019-05-21 DIAGNOSIS — Z7984 Long term (current) use of oral hypoglycemic drugs: Secondary | ICD-10-CM | POA: Diagnosis not present

## 2019-05-21 DIAGNOSIS — R29898 Other symptoms and signs involving the musculoskeletal system: Secondary | ICD-10-CM

## 2019-05-21 DIAGNOSIS — E11319 Type 2 diabetes mellitus with unspecified diabetic retinopathy without macular edema: Secondary | ICD-10-CM | POA: Insufficient documentation

## 2019-05-21 DIAGNOSIS — I2510 Atherosclerotic heart disease of native coronary artery without angina pectoris: Secondary | ICD-10-CM | POA: Diagnosis not present

## 2019-05-21 DIAGNOSIS — I10 Essential (primary) hypertension: Secondary | ICD-10-CM

## 2019-05-21 DIAGNOSIS — M48061 Spinal stenosis, lumbar region without neurogenic claudication: Secondary | ICD-10-CM | POA: Diagnosis not present

## 2019-05-21 DIAGNOSIS — N182 Chronic kidney disease, stage 2 (mild): Secondary | ICD-10-CM | POA: Diagnosis not present

## 2019-05-21 DIAGNOSIS — F1011 Alcohol abuse, in remission: Secondary | ICD-10-CM | POA: Insufficient documentation

## 2019-05-21 DIAGNOSIS — E1142 Type 2 diabetes mellitus with diabetic polyneuropathy: Secondary | ICD-10-CM | POA: Diagnosis not present

## 2019-05-21 DIAGNOSIS — Z823 Family history of stroke: Secondary | ICD-10-CM | POA: Insufficient documentation

## 2019-05-21 DIAGNOSIS — Z803 Family history of malignant neoplasm of breast: Secondary | ICD-10-CM | POA: Insufficient documentation

## 2019-05-21 LAB — GLUCOSE, POCT (MANUAL RESULT ENTRY): POC Glucose: 206 mg/dl — AB (ref 70–99)

## 2019-05-21 MED ORDER — DULOXETINE HCL 60 MG PO CPEP: 60.0000 mg | ORAL_CAPSULE | Freq: Every day | ORAL | 3 refills | Status: DC

## 2019-05-21 NOTE — Progress Notes (Signed)
Patient is having pain from waist down.

## 2019-05-21 NOTE — Patient Instructions (Signed)

## 2019-05-21 NOTE — Progress Notes (Signed)
Subjective:  Patient ID: [name], male    DOB: 1960-10-31  Age: 58 y.o. MRN: [id number]  CC: Hospitalization Follow-up   HPI [name] is a 58 year old male with a history of Hypertension, Type 2 DM (A1c 9.3), CAD,CVA with L hemiparesis, spinal stenosis here for a follow up for an ED visit from 05/09/19 where he was evaluated for a fall. CT head and C-spine were negative for acute abnormalities.  Today he describes the fall as occurring when his legs give out on him. He is sometimes unable to pull himself up from his commode. He has fallen again, about 5 days ago  since his ED visit but did not hit his head. He describes his pain as 15/10 in his back. When he urinates, ,he feels "a pull from his legs" but does not pass out. He denies LOC during these episodes. Back pain does not radiate; he also has lower extremity paresthesia. His last MRI from 06/2015 revealed: IMPRESSION: 1. Multilevel congenital and acquired spinal stenosis as described. 2. Mild subarticular and moderate foraminal narrowing bilaterally at L2-3, worse on the right. 3. Mild to moderate subarticular moderate foraminal narrowing bilaterally at L3-4 is also worse on the right. 4. Moderate central and bilateral foraminal narrowing at L4-5. 5. Shallow central disc protrusion at L5-S1 without significant stenosis.  Seen by Neurology in 01/2019; he should have had a follow up with them but had to reschedule and next appointment is not until December.  Past Medical History:  Diagnosis Date  . Alcohol abuse    11-15-2017  per pt last alcohol Dec 2018  . BPH with obstruction/lower urinary tract symptoms   . Chronic arm pain   . Chronic pain    arms, leg, back  . CKD (chronic kidney disease), stage II   . Cocaine abuse ([place])    11-15-2017  per pt last used March 2019  . Congenital hydrocephalus, unspecified (HCC)    slow progression  . Diabetic retinopathy of both eyes ([place])   . Diastolic CHF, chronic  ([place]) 07/2017  . Gait instability    multifactorial -- slow worsening congenital hydrocephalus, peripheral neuropathy, left C5-6 cord lesion  . Hematuria   . History of acute pulmonary edema 07/2017  . History of spinal cord injury 01/02/2014   pt fell, caused spinal cord contusion at C5-6--  residual left side weakness    . 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 History of TIA (transient ischemic attack) and stroke    hx cva 12-20-2010 and  TIA 02-22-2011--- no residual's from cva or tia  . Hyperlipidemia   . Hypertension    followed by pcp  . 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 Lesion of bladder   . Peripheral edema    chronic LUE  . Peripheral neuropathy   . Type 2 diabetes mellitus ([place])    followed by dr Karle Plumber--  last A1c 6.8 on 11-06-2017  . Weak urinary stream     Past Surgical History:  Procedure Laterality Date  . CARDIAC CATHETERIZATION  12/01/2008   dr hochrein   abnormal stress myoview:  mild coronary plaque, normal LVF  . CARDIAC CATHETERIZATION  04/29/2011   dr [place]   in setting ECG with new ST elevation & severe hypertensive:  nonobstructive atherosclerotic CAD, normal LVF (30% mRCA)   . CARDIOVASCULAR STRESS TEST  08/15/2010   Low  nuclear study w/ no evidence ishemia/  ef 42% with lateral and apical hypokinesis  . CYSTOSCOPY WITH BIOPSY N/A 11/20/2017   Procedure: CYSTOSCOPY WITH BIOPSY AND FULGURATION;  Surgeon: [name], MD;  Location: [facility];  Service: Urology;  Laterality: N/A;  . INCISION AND DRAINAGE RIGHT DISTAL MEDIAL THIGH HEMATOMA  04-14-2005   dr Ninfa Linden  . left arm skin graft  1976   injury  . LEFT HEART CATHETERIZATION WITH CORONARY ANGIOGRAM Bilateral 12/26/2013   Procedure: LEFT HEART CATHETERIZATION WITH CORONARY ANGIOGRAM;  Surgeon: Burnell Blanks, MD;  Location: [facility] CATH LAB;  Service: Cardiovascular;   Laterality: Bilateral;  STEMI, in setting cocaine/ alcohol :  mid disease in the RCA (20%), moderate disease in the small caliber intermediate branch (distal 50-60%), normal LVSF (ef 55-60%)  . TOOTH EXTRACTION N/A 05/16/2018   Procedure: DENTAL RESTORATION/EXTRACTIONS WITH ALVEO;  Surgeon: Diona Browner, DDS;  Location: [facility];  Service: Oral Surgery;  Laterality: N/A;  . TRANSTHORACIC ECHOCARDIOGRAM  08/05/2017   moderate LVH,  ef 55%,  grade 1 diastolic dysfunction/  mild LAE/  trivial Tr    Family History  Problem Relation Age of Onset  . Hypertension Mother   . Diabetes Mother   . Cancer Mother        breast cancer   . Hypertension Father   . Heart disease Father   . Stroke Father   . Hypertension Sister     Allergies  Allergen Reactions  . Amlodipine Swelling    Lower extremity swelling    Outpatient Medications Prior to Visit  Medication Sig Dispense Refill  . acetaminophen (TYLENOL) 500 MG tablet TAKE ONE TABLET BY MOUTH TWICE A DAY AS NEEDED 60 tablet 0  . atorvastatin (LIPITOR) 40 MG tablet Take 1.5 tablets (60 mg total) by mouth daily. 45 tablet 6  . betamethasone dipropionate 0.05 % lotion     . Blood Glucose Monitoring Suppl (TRUE METRIX METER) w/Device KIT Use as directed 1 kit 0  . dapagliflozin propanediol (FARXIGA) 5 MG TABS tablet Take 5 mg by mouth daily before breakfast. 30 tablet 5  . furosemide (LASIX) 40 MG tablet TAKE ONE TABLET BY MOUTH DAILY . TAKE AN EXTRA TABLET FOR TWO DAYS AS NEEDED FOR INCREASE LOWER EXTREMITY SWELLING 40 tablet 2  . gabapentin (NEURONTIN) 100 MG capsule Take 2 capsules (200 mg total) by mouth 2 (two) times daily. 120 capsule 6  . glipiZIDE (GLUCOTROL XL) 10 MG 24 hr tablet Take 1 tablet (10 mg total) by mouth daily with breakfast. 30 tablet 6  . glucose blood (TRUE METRIX BLOOD GLUCOSE TEST) test strip Check blood sugars 1-2 times daily 100 each 12  . glucose blood (TRUE METRIX BLOOD GLUCOSE TEST) test strip Test  BS twice a day before meals 100 each 12  . hydrALAZINE (APRESOLINE) 10 MG tablet TAKE ONE TABLET BY MOUTH TWICE A DAY 60 tablet 2  . labetalol (NORMODYNE) 100 MG tablet TAKE ONE TABLET BY MOUTH TWICE A DAY 180 tablet 0  . Lancets Misc. (ACCU-CHEK SOFTCLIX LANCET DEV) KIT Use as directed 1 kit 0  . lisinopril (ZESTRIL) 40 MG tablet TAKE ONE TABLET BY MOUTH DAILY 90 tablet 0  . loratadine (CLARITIN) 10 MG tablet TAKE ONE TABLET BY MOUTH DAILY AS NEEDED FOR ALLERGIES 30 tablet 2  . metFORMIN (GLUCOPHAGE) 1000 MG tablet Take 1 tablet (1,000 mg total) by mouth 2 (two) times daily with a meal. 180 tablet 3  . triamcinolone cream (  KENALOG) 0.1 % APPLY 1 APPLICATION TOPICALLY TWO (TWO) TIMES DAILY. 45 g 0  . TRUEPLUS LANCETS 28G MISC Use as directed 100 each 6   No facility-administered medications prior to visit.      ROS Review of Systems  Constitutional: Negative for activity change and appetite change.  HENT: Negative for sinus pressure and sore throat.   Eyes: Negative for visual disturbance.  Respiratory: Negative for cough, chest tightness and shortness of breath.   Cardiovascular: Negative for chest pain and leg swelling.  Gastrointestinal: Negative for abdominal distention, abdominal pain, constipation and diarrhea.  Endocrine: Negative.   Genitourinary: Negative for dysuria.  Musculoskeletal: Positive for back pain and gait problem. Negative for joint swelling and myalgias.  Skin: Negative for rash.  Allergic/Immunologic: Negative.   Neurological: Positive for weakness. Negative for light-headedness and numbness.  Psychiatric/Behavioral: Negative for dysphoric mood and suicidal ideas.    Objective:  BP (!) 162/78   Pulse 81   Temp 98.2 F (36.8 C) (Oral)   Ht _0  (1.905 m)   Wt 270 lb (122.5 kg)   SpO2 97%   BMI 33.75 kg/m   BP/Weight 05/21/2019 05/09/2019 5/63/1497  Systolic BP 026 378 588  Diastolic BP 78 96 82  Wt. (Lbs) 270 - 273  BMI 33.75 - 34.12       Physical Exam Constitutional:      Appearance: He is well-developed.  Neck:     Vascular: No JVD.  Cardiovascular:     Rate and Rhythm: Normal rate.     Heart sounds: Normal heart sounds. No murmur.  Pulmonary:     Effort: Pulmonary effort is normal.     Breath sounds: Normal breath sounds. No wheezing or rales.  Chest:     Chest wall: No tenderness.  Abdominal:     General: Bowel sounds are normal. There is no distension.     Palpations: Abdomen is soft. There is no mass.     Tenderness: There is no abdominal tenderness.  Musculoskeletal: Normal range of motion.     Right lower leg: No edema.     Left lower leg: No edema.  Neurological:     Mental Status: He is alert and oriented to person, place, and time.     Motor: Weakness (3+/5 in LLE, 4/5 in RLE; 4+/5 in b/l UE) present.     Gait: Gait abnormal.  Psychiatric:        Mood and Affect: Mood normal.     CMP Latest Ref Rng & Units 04/17/2019 05/13/2018 03/05/2018  Glucose 65 - 99 mg/dL 201(H) 171(H) 148(H)  BUN 8 - 27 mg/dL 22 21(H) 23(H)  Creatinine 0.76 - 1.27 mg/dL 1.42(H) 1.16 1.19  Sodium 134 - 144 mmol/L 141 139 141  Potassium 3.5 - 5.2 mmol/L 4.8 4.8 4.8  Chloride 96 - 106 mmol/L 105 105 108  CO2 20 - 29 mmol/L _1 Calcium 8.6 - 10.2 mg/dL 9.5 9.2 9.5  Total Protein 6.0 - 8.5 g/dL 7.2 - 7.1  Total Bilirubin 0.0 - 1.2 mg/dL 0.3 - 0.7  Alkaline Phos 39 - 117 IU/L 75 - 58  AST 0 - 40 IU/L 15 - 18  ALT 0 - 44 IU/L 14 - 17    Lipid Panel     Component Value Date/Time   CHOL 176 04/17/2019 1034   TRIG 174 (H) 04/17/2019 1034   HDL 52 04/17/2019 1034   CHOLHDL 3.4 04/17/2019 1034   CHOLHDL 3.4 03/05/2018 0731  VLDL 18 03/05/2018 0731   LDLCALC 94 04/17/2019 1034    CBC    Component Value Date/Time   WBC 9.0 04/17/2019 1034   WBC 7.1 03/05/2018 0731   RBC 4.69 04/17/2019 1034   RBC 4.86 03/05/2018 0731   HGB 13.3 04/17/2019 1034   HCT 39.3 04/17/2019 1034   PLT 233 04/17/2019 1034   MCV 84  04/17/2019 1034   MCH 28.4 04/17/2019 1034   MCH 27.8 03/05/2018 0731   MCHC 33.8 04/17/2019 1034   MCHC 31.9 03/05/2018 0731   RDW 12.7 04/17/2019 1034   LYMPHSABS 1.5 08/04/2017 1052   MONOABS 1.2 (H) 08/04/2017 1052   EOSABS 0.1 08/04/2017 1052   BASOSABS 0.0 08/04/2017 1052    Lab Results  Component Value Date   HGBA1C 9.3 (A) 04/17/2019    Assessment & Plan:   1. Diabetes mellitus type 2 with complications ([place]) Uncontrolled with A1c of 9.3 Regimen addressed at last visit Anticipate improvement with next A1c due in 06/2019 Continue current regimen, diabetic diet and lifestyle modification. - POCT glucose (manual entry)  2. Spinal stenosis of lumbosacral region Could explain lower extremity weakness and falls Imaging to assess for progression - CT Lumbar Spine Wo Contrast; Future - Ambulatory referral to Physical Therapy  3. Recurrent falls See #2 above - Ambulatory referral to Physical Therapy  4. Weakness of both lower extremities See #2 above   Meds ordered this encounter  Medications  . DULoxetine (CYMBALTA) 60 MG capsule    Sig: Take 1 capsule (60 mg total) by mouth daily. For back and leg pains    Dispense:  30 capsule    Refill:  3    Follow-up: Return in about 1 month (around 06/21/2019) for Follow-up of lower extremity weakness, falls with PCP Dr. Wynetta Emery.       Charlott Rakes, MD, FAAFP. [facility] [place], [place]   05/21/2019, 2:11 PM

## 2019-05-29 ENCOUNTER — Ambulatory Visit (HOSPITAL_COMMUNITY)
Admission: RE | Admit: 2019-05-29 | Discharge: 2019-05-29 | Disposition: A | Discharge status: Home / Self Care | Payer: Medicare Other | Source: Ambulatory Visit | Attending: Family Medicine | Admitting: Family Medicine

## 2019-05-29 ENCOUNTER — Other Ambulatory Visit: Payer: Self-pay

## 2019-05-29 DIAGNOSIS — M4807 Spinal stenosis, lumbosacral region: Secondary | ICD-10-CM

## 2019-05-29 DIAGNOSIS — M48061 Spinal stenosis, lumbar region without neurogenic claudication: Secondary | ICD-10-CM | POA: Diagnosis not present

## 2019-05-30 ENCOUNTER — Other Ambulatory Visit: Payer: Self-pay | Admitting: Family Medicine

## 2019-05-30 DIAGNOSIS — M48061 Spinal stenosis, lumbar region without neurogenic claudication: Secondary | ICD-10-CM

## 2019-06-11 ENCOUNTER — Ambulatory Visit: Payer: Medicare Other | Attending: Internal Medicine | Admitting: Physical Therapy

## 2019-06-11 ENCOUNTER — Other Ambulatory Visit: Payer: Self-pay

## 2019-06-11 ENCOUNTER — Encounter: Payer: Self-pay | Admitting: Physical Therapy

## 2019-06-11 DIAGNOSIS — M6281 Muscle weakness (generalized): Secondary | ICD-10-CM | POA: Diagnosis not present

## 2019-06-11 DIAGNOSIS — M5416 Radiculopathy, lumbar region: Secondary | ICD-10-CM | POA: Diagnosis not present

## 2019-06-11 DIAGNOSIS — R262 Difficulty in walking, not elsewhere classified: Secondary | ICD-10-CM | POA: Diagnosis not present

## 2019-06-11 NOTE — Therapy (Signed)
[place], [place], [address] Phone: [phone]   Fax:  [phone]  Physical Therapy Evaluation  Patient Details  Name: [name] MRN: [id number] Date of Birth: [birth year] Referring Provider (PT): Charlott Rakes, MD   Encounter Date: 06/11/2019  PT End of Session - 06/11/19 1255    Visit Number  1    Number of Visits  12    Date for PT Re-Evaluation  07/23/19    Authorization Type  Medicare/Medicaid, progress note by visit 10    PT Start Time  1145    PT Stop Time  1231    PT Time Calculation (min)  46 min    Activity Tolerance  Patient limited by pain    Behavior During Therapy  [facility] for tasks assessed/performed       Past Medical History:  Diagnosis Date  . Alcohol abuse    11-15-2017  per pt last alcohol Dec 2018  . BPH with obstruction/lower urinary tract symptoms   . Chronic arm pain   . Chronic pain    arms, leg, back  . CKD (chronic kidney disease), stage II   . Cocaine abuse ([place])    11-15-2017  per pt last used March 2019  . Congenital hydrocephalus, unspecified (HCC)    slow progression  . Diabetic retinopathy of both eyes ([place])   . Diastolic CHF, chronic ([place]) 07/2017  . Gait instability    multifactorial -- slow worsening congenital hydrocephalus, peripheral neuropathy, left C5-6 cord lesion  . Hematuria   . History of acute pulmonary edema 07/2017  . History of spinal cord injury 01/02/2014   pt fell, caused spinal cord contusion at C5-6--  residual left side weakness    . 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 History of TIA (transient ischemic attack) and stroke    hx cva 12-20-2010 and  TIA 02-22-2011--- no residual's from cva or tia  . Hyperlipidemia   . Hypertension    followed by pcp  . 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 Lesion of bladder   . Peripheral edema     chronic LUE  . Peripheral neuropathy   . Type 2 diabetes mellitus ([place])    followed by dr Karle Plumber--  last A1c 6.8 on 11-06-2017  . Weak urinary stream     Past Surgical History:  Procedure Laterality Date  . CARDIAC CATHETERIZATION  12/01/2008   dr hochrein   abnormal stress myoview:  mild coronary plaque, normal LVF  . CARDIAC CATHETERIZATION  04/29/2011   dr [place]ique   in setting ECG with new ST elevation & severe hypertensive:  nonobstructive atherosclerotic CAD, normal LVF (30% mRCA)   . CARDIOVASCULAR STRESS TEST  08/15/2010   Low nuclear study w/ no evidence ishemia/  ef 42% with lateral and apical hypokinesis  . CYSTOSCOPY WITH BIOPSY N/A 11/20/2017   Procedure: CYSTOSCOPY WITH BIOPSY AND FULGURATION;  Surgeon: [name], MD;  Location[facility];  Service: Urology;  Laterality: N/A;  . INCISION AND DRAINAGE RIGHT DISTAL MEDIAL THIGH HEMATOMA  04-14-2005   dr Ninfa Linden  . left arm skin graft  1976   injury  . LEFT HEART CATHETERIZATION WITH CORONARY ANGIOGRAM Bilateral 12/26/2013   Procedure: LEFT HEART CATHETERIZATION WITH CORONARY ANGIOGRAM;  Surgeon: Burnell Blanks, MD;  Location: [facility] CATH LAB;  Service: Cardiovascular;  Laterality: Bilateral;  STEMI, in  setting cocaine/ alcohol :  mid disease in the RCA (20%), moderate disease in the small caliber intermediate branch (distal 50-60%), normal LVSF (ef 55-60%)  . TOOTH EXTRACTION N/A 05/16/2018   Procedure: DENTAL RESTORATION/EXTRACTIONS WITH ALVEO;  Surgeon: Diona Browner, DDS;  Location: [place];  Service: Oral Surgery;  Laterality: N/A;  . TRANSTHORACIC ECHOCARDIOGRAM  08/05/2017   moderate LVH,  ef 55%,  grade 1 diastolic dysfunction/  mild LAE/  trivial Tr    There were no vitals filed for this visit.   Subjective Assessment - 06/11/19 1107    Subjective  Pt. is a 58 y/o male referred to PT with c/o LBP with underlying stenosis with gait/balance difficulties s/p recent  fall. Pt. reports approximately 5 year history of chronic LBP issues. He had a fall at home after using the restroom on 05/09/19 with impact to head on commode. He managed to reach his phone and was taken by ambulance to ED with CT scans for head and neck (-) for acute injury. He also subsequently had a lumbar CT 05/29/19 with no acute injury but findings of severe lumbar stenosis, disc bulge. See pertinent history below. Current complaints include LBP with radiating into bilat. LE distal to heel region exacerbated primarily when standing.    Pertinent History  history of spinal cord contusion, chronic left-sided weakness, TIA/CVA, STEMI, CHF, Diabetic, CKD, hydrocephalus, HTN, Obesity    Limitations  House hold activities;Lifting;Standing;Walking    How long can you sit comfortably?  variable    How long can you stand comfortably?  "a couple of minutes"    How long can you walk comfortably?  unable comfortably    Diagnostic tests  CT scans head and neck 05/09/19, lumbar 05/29/19    Patient Stated Goals  Get the pain to go away    Currently in Pain?  Yes    Pain Score  7     Pain Location  Back    Pain Orientation  Lower;Left;Right    Pain Descriptors / Indicators  Sharp    Pain Type  Chronic pain    Pain Radiating Towards  from back down bilateral LE distally to heels    Pain Onset  More than a month ago    Pain Frequency  Intermittent    Aggravating Factors   standing, prolonged sitting (standing is worse than sitting)    Pain Relieving Factors  some ease with medication    Effect of Pain on Daily Activities  limits positional tolerance         [facility] PT Assessment - 06/11/19 0001      Assessment   Medical Diagnosis  Lumbar spine stenosis, frequent falls    Referring Provider (PT)  Charlott Rakes, MD    Onset Date/Surgical Date  05/09/19    Hand Dominance  Right    Next MD Visit  07/07/2019    Prior Therapy  --   reports did home health PT last Spring     Precautions   Precautions   Fall      Restrictions   Weight Bearing Restrictions  No      Balance Screen   Has the patient fallen in the past 6 months  Yes    How many times?  2      [place] residence    Living Arrangements  Alone    Type of [place]  --  level entry at front, 5 steps to back door with right [place]  One level    [place] - 4 wheels;Cane - quad;Shower seat      Prior Function   Level of Independence  Independent with household mobility with device   used both quad cane and rollator-now uses rollator more     Cognition   Overall Cognitive Status  Within Functional Limits for tasks assessed      Observation/Other Assessments   Focus on Therapeutic Outcomes (FOTO)   60% limited      ROM / Strength   AROM / PROM / Strength  AROM;PROM;Strength      AROM   Overall AROM Comments  Limited ability to test standing trunk AROM due to decreased balance particularly for trunk flexion-pt. able to demo 30 deg AROM with hands on mat for safety and support    AROM Assessment Site  Hip;Lumbar    Right/Left Hip  Right;Left    Right Hip Flexion  100    Right Hip External Rotation   35    Right Hip Internal Rotation   10    Right Hip ABduction  45    Right Hip ADduction  --   [facility]   Left Hip Flexion  110    Left Hip External Rotation   45    Left Hip Internal Rotation   15    Left Hip ABduction  40    Lumbar Flexion  30   see comments above   Lumbar Extension  10   increased LBP with bilat. leg pain   Lumbar - Right Side Bend  15    Lumbar - Left Side Bend  15    Lumbar - Right Rotation  30%    Lumbar - Left Rotation  30%      PROM   Overall PROM Comments  Hip PROM grossly WFL bilat.      Strength   Strength Assessment Site  Hip;Knee;Ankle    Right/Left Hip  Right;Left    Right Hip Flexion  3+/5    Left Hip Flexion  3-/5    Right/Left Knee  Right;Left    Right Knee Flexion  4+/5    Right Knee  Extension  4+/5    Left Knee Flexion  4-/5    Left Knee Extension  4-/5    Right/Left Ankle  Right;Left    Right Ankle Dorsiflexion  5/5    Right Ankle Inversion  4+/5    Right Ankle Eversion  4+/5    Left Ankle Dorsiflexion  4-/5    Left Ankle Inversion  3+/5    Left Ankle Eversion  3+/5      Special Tests   Other special tests  SLR (+) bilat. at 30 deg       Bed Mobility   Bed Mobility  Supine to Sit;Sit to Supine    Sit to Supine  Minimal Assistance - Patient > 75%      Ambulation/Gait   Assistive device  4-wheeled walker    Gait Pattern  Decreased step length - right;Decreased step length - left;Narrow base of support    Ambulation Surface  Level    Gait Comments  Tinetti Gait + Balance 15/28. Pt. ambulates in clinic mod I with rollator with decreased bilateral step length, antalgia                Objective measurements completed on examination: See above findings.  [facility] Adult PT Treatment/Exercise - 06/11/19 0001      Exercises   Exercises  --   HEP handout review            PT Education - 06/11/19 1254    Education Details  spine anatomy with potential symptom etiology, HEP, POC    Person(s) Educated  Patient    Methods  Explanation;Demonstration;Verbal cues;Handout    Comprehension  Verbalized understanding       PT Short Term Goals - 06/11/19 1308      PT SHORT TERM GOAL #1   Title  Independent with HEP    Baseline  needs HEP    Time  3    Period  Weeks    Status  New      PT SHORT TERM GOAL #2   Title  Demonstrate bilat. step-through gait with rollator for progression with LTG goal for Tinetti/decreased fall risk    Baseline  decreased step length bilat. with rollator, unable step-through gait pattern    Time  3    Period  Weeks    Status  New      PT SHORT TERM GOAL #3   Title  Perform bed mobility for sit<>supine independently to modified independently for improved ease with bed mobility at home    Baseline  requires min  assist in clinic    Time  3    Period  Weeks    Status  New        PT Long Term Goals - 06/11/19 1309      PT LONG TERM GOAL #1   Title  Improve FOTO score to 42% or less impairment due to back    Baseline  60% limited    Time  6    Period  Weeks    Status  New    Target Date  07/13/19      PT LONG TERM GOAL #2   Title  Improve Tinetti Gait + Balance score to at least >/= 19/28 for decreased fall risk    Baseline  15/28    Time  6    Period  Weeks    Status  New    Target Date  07/23/19      PT LONG TERM GOAL #3   Title  Ambulate 80 feet or greater mod I with NBQC to work toward ability to use cane for ambulation in home between rooms and to use restroom    Baseline  uses rollator    Time  6    Period  Weeks    Status  New    Target Date  07/23/19      PT LONG TERM GOAL #4   Title  Increase bilat. LE strength grossly 1/2 MMT grade to improve ability for transfers from low seats    Baseline  see objective    Time  6    Period  Weeks    Status  New    Target Date  07/23/19      PT LONG TERM GOAL #5   Title  Tolerate standing for doing dishes, meal preparation at home for periods at least 10-15 minutes with LBP/leg pain 5/10 or less    Baseline  reports limited to a "couple of minutes", pain 7/10    Time  6    Period  Weeks    Status  New    Target Date  07/23/19  Plan - 06/11/19 1256    Clinical Impression Statement  Pt. presents with acute exacerbation of chronic LBP s/p fall last month with bilateral LE radicular symptoms. Tinetti Gait and Balance score of 15/28 consistent with increased fall risk. Additional findings of general LE weakness (more notable on left with history left sided weakness s/p spinal cord contusion) and limited trunk ROM. Flexion bias for trunk noted which would be consistent with underlying stenosis but also (+) SLR test bilat. which suspect could correlate with imaging findings of disc bulge as well. Pt. would benefit from  PT to help improve back symptoms and address current functional limitations for mobility/work towards decreased fall risk.    Personal Factors and Comorbidities  Comorbidity 3+;Time since onset of injury/illness/exacerbation   time since onset as noted due to history chronic LBP symptoms prior to fall last month   Comorbidities  spinal cord contusion/history left sided weakness, TIA/CVA, diabetic, obesity, STEMI, CHF, CKD    Examination-Activity Limitations  Bathing;Dressing;Sit;Transfers;Bed Mobility;Lift;Bend;Locomotion Level;Stairs;Carry;Toileting    Examination-Participation Restrictions  Shop;Cleaning;Laundry;Community Activity;Meal Prep    Stability/Clinical Decision Making  Evolving/Moderate complexity    Clinical Decision Making  Moderate    Rehab Potential  Fair    PT Frequency  2x / week    PT Duration  6 weeks    PT Treatment/Interventions  ADLs/Self Care Home Management;Electrical Stimulation;Moist Heat;Ultrasound;Cryotherapy;Traction;Functional mobility training;Therapeutic activities;Therapeutic exercise;Balance training;Gait training;Stair training;Dry needling;Manual techniques;Patient/family education;Neuromuscular re-education;Taping    PT Next Visit Plan  Review/practice HEP as needed (no time at eval), NUSTEP warm-up, trial flexion bias trunk ROM with gentle mat-based exercises, general LE strengthening, standing balance exercises as tolerated, modalities prn for pain    PT Home Exercise Plan  hooklying posterior pelvic tilt, SKTC with strap assist, LTR, sit<>stand from edge of bed    Consulted and Agree with Plan of Care  Patient       Patient will benefit from skilled therapeutic intervention in order to improve the following deficits and impairments:  Abnormal gait, Decreased balance, Decreased endurance, Difficulty walking, Decreased range of motion, Decreased activity tolerance, Decreased strength, Pain  Visit Diagnosis: Lumbar radiculopathy  Muscle weakness  (generalized)  Difficulty in walking, not elsewhere classified     Problem List Patient Active Problem List   Diagnosis Date Noted  . History of spinal cord injury 04/17/2019  . TIA (transient ischemic attack) 03/05/2018  . Benign essential HTN 03/05/2018  . Sensorineural hearing loss of both ears 12/04/2017  . Mild nonproliferative diabetic retinopathy ([place]) 01/10/2016  . Hypertensive retinopathy of both eyes 01/10/2016  . Spinal stenosis of lumbar region 07/15/2015  . Abnormality of gait 06/07/2015  . Hydrocephalus ([place]) 06/07/2015  . Peripheral neuropathy 04/26/2015  . Constipation 04/26/2015  . Diabetes mellitus type 2 with complications ([place]) 0000000  . Decreased hearing of both ears 03/15/2015  . Tinea pedis 03/15/2015  . Callus of foot 05/22/2014  . Cocaine abuse ([place]) 12/28/2013  . Left-sided weakness 12/26/2013  . Hyperlipidemia associated with type 2 diabetes mellitus ([place]) 04/01/2007  . Class 2 severe obesity due to excess calories with serious comorbidity and body mass index (BMI) of 36.0 to 36.9 in adult ([place]) 03/28/2007  . Essential hypertension 03/28/2007    Beaulah Dinning, PT, DPT 06/11/19 1:18 PM  [facility] [address] [place], [place], [address] Phone: [phone]   Fax:  [phone]  Name: [name] MRN: [id number] Date of Birth: March 19, 1961

## 2019-07-02 ENCOUNTER — Other Ambulatory Visit: Payer: Self-pay | Admitting: Internal Medicine

## 2019-07-02 DIAGNOSIS — I10 Essential (primary) hypertension: Secondary | ICD-10-CM

## 2019-07-03 ENCOUNTER — Ambulatory Visit (INDEPENDENT_AMBULATORY_CARE_PROVIDER_SITE_OTHER): Payer: Medicare Other | Admitting: Adult Health

## 2019-07-03 ENCOUNTER — Other Ambulatory Visit: Payer: Self-pay | Admitting: Internal Medicine

## 2019-07-03 ENCOUNTER — Encounter: Payer: Self-pay | Admitting: Adult Health

## 2019-07-03 ENCOUNTER — Other Ambulatory Visit: Payer: Self-pay

## 2019-07-03 VITALS — BP 134/72 | HR 72 | Temp 96.5°F | Ht 75.0 in | Wt 265.6 lb

## 2019-07-03 DIAGNOSIS — I10 Essential (primary) hypertension: Secondary | ICD-10-CM

## 2019-07-03 DIAGNOSIS — G459 Transient cerebral ischemic attack, unspecified: Secondary | ICD-10-CM | POA: Diagnosis not present

## 2019-07-03 DIAGNOSIS — L729 Follicular cyst of the skin and subcutaneous tissue, unspecified: Secondary | ICD-10-CM | POA: Diagnosis not present

## 2019-07-03 DIAGNOSIS — E118 Type 2 diabetes mellitus with unspecified complications: Secondary | ICD-10-CM | POA: Diagnosis not present

## 2019-07-03 DIAGNOSIS — E785 Hyperlipidemia, unspecified: Secondary | ICD-10-CM | POA: Diagnosis not present

## 2019-07-03 MED ORDER — ASPIRIN EC 81 MG PO TBEC: 81.0000 mg | DELAYED_RELEASE_TABLET | Freq: Every day | ORAL | 4 refills | Status: DC

## 2019-07-03 NOTE — Progress Notes (Signed)
[facility] [address] [place]. [place] [address] [phone]       OFFICE FOLLOW UP NOTE  [name] Date of Birth:  Jan 03, 1961 Medical Record Number:  [id number]   Reason for Referral:  hospital stroke follow up  CHIEF COMPLAINT:  Chief Complaint  Patient presents with   Follow-up    3 mon f/u. Alone. Treatment room. No new concerns at this time.     HPI:   Update 07/03/2019: [name] is a 58 year old male who is being seen today for follow-up.  He has not had any reoccurring stroke or TIA symptoms.  He unfortunately had a recent fall on 05/09/2019 for which he was evaluated in the ED without evidence of injury.  He did have follow-up with PCP shortly after admission and recommended obtaining CT lumbar spine and refer to physical therapy.  Imaging did show high-grade spinal stenosis from L2-3 to L4-5 due to disc bulge with spinal stenosis particular severe at L4-5 and moderate foraminal narrowing on both sides from L2-3 to L4-5.  He did initiate physical therapy on 06/11/2019 but states he is currently awaiting a call to restart therapies.  He continues to ambulate with his Rollator walker and has not had any recent falls since October.  Discussion at prior visit regarding restarting aspirin 81 mg daily for stroke prevention with extensive cardiac history but he denies restarting at this time.  Continues on atorvastatin 40 mg daily without myalgias.  Blood pressure today 134/72.  Glucose levels have been stable.  He does have concerns regarding right occipital area fluid-filled cyst 4 cm x 2 cm.  Multiple dermal cysts have been reported on prior imaging but he reports this specific 1 has been draining over the past couple months.  He denies pain associated.  He plans on speaking to PCP for further evaluation.  No further concerns at this time.  Update 02/04/2019: [name] is being seen today for follow up regarding TIA vs conversion disorder on 03/04/18.   Stable from stroke standpoint without new or recurring stroke/TIA symptoms.  He continues on atorvastatin 40 mg daily but at some point discontinued aspirin 81 mg as he states he was told by his PCP he no longer needed this medication for undetermined reasons.  He denies any complication with bleeding or bruising.  Blood pressure satisfactory at 118/67. He has been followed by his PCP for complaints of LLE pain consisting of numbness/tingling over the past 2 months.  He previously experienced intermittent neuropathy but has now been experiencing this constantly which is worse distally when standing but will radiate through the back of his calf and thigh.  Denies back or hip pain.  PCP initiated gabapentin twice daily along with home physical therapy. He does endorse improvement with use of home PT when she has been participating in over the past month. 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  Denies any worsening weakness.  He continues to use a cane for ambulation.  Denies any recent falls.   No further concerns at this time.     Initial visit 04/26/18: [name] is a 58 y.o. male with history of HTN, HLD, DB, obesity, CAD/STEMI, CHF, alcohol use, cocaine abuse, CKD, congenital hydrocephalus, traumatic spinal cord contusion in 12/2013 with resultant L HP (no documented stroke) who presented to [facility]  on 03/04/2018 with R sided weakness.  CT had reviewed and was negative for acute abnormalities but did show evidence of  small vessel disease.  MRI head reviewed and was negative for acute stroke.  MRA was negative for emergent LVO but did show some narrowing in the left ICA which was unchanged from prior imaging in 2016.  Carotid Doppler showed bilateral ICA stenosis of 1 to 39% with VAs antegrade.  2D echo showed an EF of 65 to 70% without evidence of cardiac source of embolus.  LDL 98 and recommended increase from Lipitor 40 mg to Lipitor 80 mg daily.  HTN stable during  admission and recommended long-term BP goal normotensive range.  A1c 7.0 and recommended continue follow-up with PCP for DM management.  Current smoker and recommended smoking cessation with counseling provided during admission.  Per notes, current condition concern for TIA versus conversion disorder as MRI negative and previous diagnosis of conversion disorder.  Patient was not compliant with aspirin 81 mg PTA and recommended to restart aspirin 81 mg daily with importance of compliance.  Patient was discharged home in stable condition with recommendations of home health PT/OT.  Patient is being seen today for hospital follow-up.  He has since completed home therapies and has resumed back to all prior activities.  He states all prior right-sided weakness has resolved but continues to have some left hemiparesis which is chronic.  He states compliance with aspirin 81 mg and denies any bleeding or bruising.  Continues to take increased dose of Lipitor without side effects myalgias.  Blood pressure today satisfactory 129/74.  Does monitor glucose levels at home and typically range from 100-1 50.  He does live at home independently but has an aide come in daily to assist with ADLs and IADLs.  He uses a cane for ambulation due to chronic left hemiparesis.  Denies current tobacco use or recreational drug use.  Denies new or worsening stroke/TIA symptoms. On a side note, patient has complaints of bilateral upper extremity and back of head "rash" which she states has been present for approximately 2 months and was seen by his PCP in regards to this and per patient, was told that it was a "summer rash".  He does endorse pruritus without improvement.    ROS:   14 system review of systems performed and negative with exception of numbness/tingling, weakness and pain  PMH:  Past Medical History:  Diagnosis Date   Alcohol abuse    11-15-2017  per pt last alcohol Dec 2018   BPH with obstruction/lower urinary tract  symptoms    Chronic arm pain    Chronic pain    arms, leg, back   CKD (chronic kidney disease), stage II    Cocaine abuse ([place])    11-15-2017  per pt last used March 2019   Congenital hydrocephalus, unspecified ([place])    slow progression   Diabetic retinopathy of both eyes (HCC)    Diastolic CHF, chronic ([place]) 07/2017   Gait instability    multifactorial -- slow worsening congenital hydrocephalus, peripheral neuropathy, left C5-6 cord lesion   Hematuria    History of acute pulmonary edema 07/2017   History of spinal cord injury 01/02/2014   pt fell, caused spinal cord contusion at C5-6--  residual left side weakness     History of ST elevation myocardial infarction (STEMI) 12/26/2013   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   History of TIA (transient ischemic attack) and stroke    hx cva 12-20-2010 and  TIA 02-22-2011--- no residual's from cva or tia   Hyperlipidemia    Hypertension    followed by pcp  Left-sided weakness 12/2013   chronic due to spinal cord contusion   Lesion of bladder    Peripheral edema    chronic LUE   Peripheral neuropathy    Type 2 diabetes mellitus ([place])    followed by dr Karle Plumber--  last A1c 6.8 on 11-06-2017   Weak urinary stream     PSH:  Past Surgical History:  Procedure Laterality Date   CARDIAC CATHETERIZATION  12/01/2008   dr hochrein   abnormal stress myoview:  mild coronary plaque, normal LVF   CARDIAC CATHETERIZATION  04/29/2011   dr [place]ique   in setting ECG with new ST elevation & severe hypertensive:  nonobstructive atherosclerotic CAD, normal LVF (30% mRCA)    CARDIOVASCULAR STRESS TEST  08/15/2010   Low nuclear study w/ no evidence ishemia/  ef 42% with lateral and apical hypokinesis   CYSTOSCOPY WITH BIOPSY N/A 11/20/2017   Procedure: CYSTOSCOPY WITH BIOPSY AND FULGURATION;  Surgeon: [name], MD;  Location: [facility];  Service: Urology;  Laterality: N/A;   INCISION AND DRAINAGE  RIGHT DISTAL MEDIAL THIGH HEMATOMA  04-14-2005   dr Ninfa Linden   left arm skin graft  1976   injury   LEFT HEART CATHETERIZATION WITH CORONARY ANGIOGRAM Bilateral 12/26/2013   Procedure: LEFT HEART CATHETERIZATION WITH CORONARY ANGIOGRAM;  Surgeon: Burnell Blanks, MD;  Location: [facility] C[facility] LAB;  Service: Cardiovascular;  Laterality: Bilateral;  STEMI, in setting cocaine/ alcohol :  mid disease in the RCA (20%), moderate disease in the small caliber intermediate branch (distal 50-60%), normal LVSF (ef 55-60%)   TOOTH EXTRACTION N/A 05/16/2018   Procedure: DENTAL RESTORATION/EXTRACTIONS WITH ALVEO;  Surgeon: Diona Browner, DDS;  Location: [facility];  Service: Oral Surgery;  Laterality: N/A;   TRANSTHORACIC ECHOCARDIOGRAM  08/05/2017   moderate LVH,  ef 55%,  grade 1 diastolic dysfunction/  mild LAE/  trivial Tr    Social History:  Social History   Socioeconomic History   Marital status: Divorced    Spouse name: Not on file   Number of children: 0   Years of education: 10   Highest education level: Not on file  Occupational History   Occupation: Unemployed  Tobacco Use   Smoking status: Former Smoker    Years: 45.00    Types: Cigarettes    Quit date: 03/03/2018    Years since quitting: 1.3   Smokeless tobacco: Never Used   Tobacco comment: 11-15-2017  per pt 1pp3days  Substance and Sexual Activity   Alcohol use: Not Currently    Alcohol/week: 0.0 standard drinks    Comment: 11-15-2017  per pt last alcohol 12/ 2018  alcohol abuse   Drug use: Not Currently    Types: "Crack" cocaine, Cocaine    Comment: 11-15-2017  per pt last used March 2019   Sexual activity: Not Currently  Other Topics Concern   Not on file  Social History Narrative   Lives at home alone.   Right-handed.   Occasional use.   Social Determinants of Health   Financial Resource Strain:    Difficulty of Paying Living Expenses: Not on file  Food Insecurity:    Worried About  Charity fundraiser in the Last Year: Not on file   [employer] of Food in the Last Year: Not on file  Transportation Needs:    Lack of Transportation (Medical): Not on file   Lack of Transportation (Non-Medical): Not on file  Physical Activity:    Days of Exercise  per Week: Not on file   Minutes of Exercise per Session: Not on file  Stress:    Feeling of Stress : Not on file  Social Connections:    Frequency of Communication with Friends and Family: Not on file   Frequency of Social Gatherings with Friends and Family: Not on file   Attends Religious Services: Not on file   Active Member of Clubs or Organizations: Not on file   Attends Archivist Meetings: Not on file   Marital Status: Not on file  Intimate Partner Violence:    Fear of Current or Ex-Partner: Not on file   Emotionally Abused: Not on file   Physically Abused: Not on file   Sexually Abused: Not on file    Family History:  Family History  Problem Relation Age of Onset   Hypertension Mother    Diabetes Mother    Cancer Mother        breast cancer    Hypertension Father    Heart disease Father    Stroke Father    Hypertension Sister     Medications:   Current Outpatient Medications on File Prior to Visit  Medication Sig Dispense Refill   acetaminophen (TYLENOL) 500 MG tablet TAKE ONE TABLET BY MOUTH TWICE A DAY AS NEEDED 60 tablet 0   atorvastatin (LIPITOR) 40 MG tablet Take 1.5 tablets (60 mg total) by mouth daily. 45 tablet 6   betamethasone dipropionate 0.05 % lotion      Blood Glucose Monitoring Suppl (TRUE METRIX METER) w/Device KIT Use as directed 1 kit 0   dapagliflozin propanediol (FARXIGA) 5 MG TABS tablet Take 5 mg by mouth daily before breakfast. 30 tablet 5   DULoxetine (CYMBALTA) 60 MG capsule Take 1 capsule (60 mg total) by mouth daily. For back and leg pains 30 capsule 3   furosemide (LASIX) 40 MG tablet TAKE ONE TABLET BY MOUTH DAILY . TAKE AN EXTRA TABLET  FOR TWO DAYS AS NEEDED FOR INCREASE LOWER EXTREMITY SWELLING 40 tablet 2   gabapentin (NEURONTIN) 100 MG capsule Take 2 capsules (200 mg total) by mouth 2 (two) times daily. 120 capsule 6   glipiZIDE (GLUCOTROL XL) 10 MG 24 hr tablet Take 1 tablet (10 mg total) by mouth daily with breakfast. 30 tablet 6   glucose blood (TRUE METRIX BLOOD GLUCOSE TEST) test strip Check blood sugars 1-2 times daily 100 each 12   glucose blood (TRUE METRIX BLOOD GLUCOSE TEST) test strip Test BS twice a day before meals 100 each 12   hydrALAZINE (APRESOLINE) 10 MG tablet TAKE ONE TABLET BY MOUTH TWICE A DAY 60 tablet 1   labetalol (NORMODYNE) 100 MG tablet TAKE ONE TABLET BY MOUTH TWICE A DAY 180 tablet 0   Lancets Misc. (ACCU-CHEK SOFTCLIX LANCET DEV) KIT Use as directed 1 kit 0   lisinopril (ZESTRIL) 40 MG tablet TAKE ONE TABLET BY MOUTH DAILY 90 tablet 0   loratadine (CLARITIN) 10 MG tablet TAKE ONE TABLET BY MOUTH DAILY AS NEEDED FOR ALLERGIES 30 tablet 2   metFORMIN (GLUCOPHAGE) 1000 MG tablet Take 1 tablet (1,000 mg total) by mouth 2 (two) times daily with a meal. 180 tablet 3   triamcinolone cream (KENALOG) 0.1 % APPLY 1 APPLICATION TOPICALLY TWO (TWO) TIMES DAILY. 45 g 0   TRUEPLUS LANCETS 28G MISC Use as directed 100 each 6   No current facility-administered medications on file prior to visit.    Allergies:   Allergies  Allergen Reactions  Amlodipine Swelling    Lower extremity swelling     Physical Exam  Vitals:   07/03/19 0714  BP: 134/72  Pulse: 72  Temp: (!) 96.5 F (35.8 C)  TempSrc: Oral  Weight: 265 lb 9.6 oz (120.5 kg)  Height: '6\' 3"'  (1.905 m)   Body mass index is 33.2 kg/m. No exam data present  General: well developed, well nourished, pleasant middle-aged African-American male, seated, in no evident distress Head: head normocephalic and atraumatic.  Possible sebaceous cyst right occipital area measuring 4 cm x 2 cm Neck: supple with no carotid or supraclavicular  bruits Cardiovascular: regular rate and rhythm, no murmurs Musculoskeletal: no deformity Skin:  no rash/petichiae Vascular:  Normal pulses all extremities  Neurologic Exam Mental Status: Awake and fully alert. Oriented to place and time. Recent and remote memory intact. Attention span, concentration and fund of knowledge appropriate. Mood and affect appropriate.  Cranial Nerves: Pupils equal, briskly reactive to light. Extraocular movements full without nystagmus. Visual fields full to confrontation. Hearing intact. Facial sensation intact. Face, tongue, palate moves normally and symmetrically.  Motor: Normal bulk and tone. Normal strength in all tested extremity muscles except mild bilateral lower extremity weakness L>R Sensory.:  Reports light touch sensation intact with decreased vibratory sensation distal bilateral lower extremities Coordination: Rapid alternating movements normal in all extremities. Finger-to-nose performed accurately bilaterally and heel-to-shin difficulty performing due to lower extremity weakness. Gait and Station: Arises from chair with mild difficulty. Stance is normal. Gait demonstrates short shuffled broad-based gait with assistance of Rollator walker Reflexes: 1+ and symmetric. Toes downgoing.       Diagnostic Data (Labs, Imaging, Testing)  CT HEAD WO CONTRAST 03/04/2018 IMPRESSION: No acute intracranial abnormalities. Mild diffuse cerebral atrophy and small vessel ischemic changes. Ventricular dilatation is likely due to central atrophy.  MR BRAIN WO CONTRAST 03/05/2018 IMPRESSION: 1. No acute intracranial abnormality. 2. Stable moderate diffuse volume loss of the brain and absent septum pellucidum.  MR MRA HEAD  03/05/2018 IMPRESSION: No emergent large vessel occlusion. Narrowing of the distal cavernous segment of the left internal carotid artery is unchanged from the CTA 04/26/2015.  VAS US CAROTID DUPLEX BILATERAL 03/05/2018 Final  Interpretation: Right Carotid: Velocities in the right ICA are consistent with a 1-39% stenosis Left Carotid: Velocities in the left ICA are consistent with a 1-39% stenosis. Vertebrals: Bilateral vertebral arteries demonstrate antegrade flow.  ECHOCARDIOGRAM 03/05/2018 Study Conclusions  - Left ventricle: The cavity size was mildly dilated. There was   mild concentric hypertrophy. Systolic function was vigorous. The   estimated ejection fraction was in the range of 65% to 70%. Wall   motion was normal; there were no regional wall motion   abnormalities. Features are consistent with a pseudonormal left   ventricular filling pattern, with concomitant abnormal relaxation   and increased filling pressure (grade 2 diastolic dysfunction). - Left atrium: The atrium was mildly dilated. - Pulmonary arteries: Systolic pressure could not be accurately   estimated.    ASSESSMENT: DEARIUS HOFFMANN is a 58 y.o. year old male here with TIA versus conversion disorder on 03/04/2018. Vascular risk factors include HTN, HLD, DM, obesity, CAD/STEMI, CHF, alcohol use, cocaine abuse, CKD, congenital hydrocephalus, and traumatic spinal cord contusion with residual mild left hemiparesis and has been having worsening bilateral lower extremity weakness potentially due to spinal stenosis and deconditioning    PLAN: -Recommend restarting aspirin 81 mg daily for secondary stroke prevention and history of CAD/STEMI and continue Lipitor 40 mg for secondary stroke  prevention -F/u with PCP regarding your HLD, HTN and DM management -Advised to contact physical therapy office to schedule follow-up visits for ongoing therapy session to assist with gait training and strengthening -Advised to follow-up with PCP regarding dermal cyst for further evaluation and monitoring -continue to monitor BP at home -advised to continue to stay active and maintain a healthy diet -Maintain strict control of hypertension with blood  pressure goal below 130/90, diabetes with hemoglobin A1c goal below 6.5% and cholesterol with LDL cholesterol (bad cholesterol) goal below 70 mg/dL. I also advised the patient to eat a healthy diet with plenty of whole grains, cereals, fruits and vegetables, exercise regularly and maintain ideal body weight.  Overall stable from a stroke standpoint and recommend follow-up as needed   Greater than 50% of time during this 25 minute visit was spent on counseling, discussion regarding paresthesias with radiculopathy, explanation of diagnosis of TIA versus conversion disorder, reviewing risk factor management of HTN, HLD, DM, discussion regarding concerns of epidermal cyst, planning of further management, discussion with patient answering all questions to [place], AGNP-BC  [facility] [address] [place], [place] [address]  Phone [phone] Fax [phone] Note: This document was prepared with digital dictation and possible smart phrase technology. Any transcriptional errors that result from this process are unintentional.

## 2019-07-03 NOTE — Progress Notes (Signed)
I agree with the above plan 

## 2019-07-03 NOTE — Patient Instructions (Addendum)
Recommend restarting aspirin 81 mg daily for secondary stroke prevention with extensive cardiac history.  Also continue atorvastatin 40 mg daily  for secondary stroke prevention  Continue to work with physical therapy for spinal stenosis and improvement of your gait and lower extremity strength  Continue to follow up with PCP regarding cholesterol, blood pressure and diabetes management  Also speak to your PCP regarding small cyst on the back of your head for possible need of treatment  Continue to monitor blood pressure at home  Maintain strict control of hypertension with blood pressure goal below 130/90, diabetes with hemoglobin A1c goal below 6.5% and cholesterol with LDL cholesterol (bad cholesterol) goal below 70 mg/dL. I also advised the patient to eat a healthy diet with plenty of whole grains, cereals, fruits and vegetables, exercise regularly and maintain ideal body weight.        Thank you for coming to see [name] [facility]. I hope we have been able to provide you high quality care today.  You may receive a patient satisfaction survey over the next few weeks. We would appreciate your feedback and comments so that we may continue to improve ourselves and the health of our patients.

## 2019-07-07 ENCOUNTER — Other Ambulatory Visit: Payer: Self-pay

## 2019-07-07 ENCOUNTER — Ambulatory Visit: Payer: Medicare Other | Attending: Internal Medicine | Admitting: Internal Medicine

## 2019-07-07 ENCOUNTER — Encounter: Payer: Self-pay | Admitting: Internal Medicine

## 2019-07-07 VITALS — BP 149/91 | HR 81 | Temp 98.0°F | Wt 264.8 lb

## 2019-07-07 DIAGNOSIS — Z8249 Family history of ischemic heart disease and other diseases of the circulatory system: Secondary | ICD-10-CM | POA: Insufficient documentation

## 2019-07-07 DIAGNOSIS — I110 Hypertensive heart disease with heart failure: Secondary | ICD-10-CM | POA: Insufficient documentation

## 2019-07-07 DIAGNOSIS — I10 Essential (primary) hypertension: Secondary | ICD-10-CM | POA: Diagnosis not present

## 2019-07-07 DIAGNOSIS — Z888 Allergy status to other drugs, medicaments and biological substances status: Secondary | ICD-10-CM | POA: Diagnosis not present

## 2019-07-07 DIAGNOSIS — Z7982 Long term (current) use of aspirin: Secondary | ICD-10-CM | POA: Insufficient documentation

## 2019-07-07 DIAGNOSIS — M48061 Spinal stenosis, lumbar region without neurogenic claudication: Secondary | ICD-10-CM | POA: Diagnosis not present

## 2019-07-07 DIAGNOSIS — L84 Corns and callosities: Secondary | ICD-10-CM | POA: Diagnosis not present

## 2019-07-07 DIAGNOSIS — E118 Type 2 diabetes mellitus with unspecified complications: Secondary | ICD-10-CM

## 2019-07-07 DIAGNOSIS — R600 Localized edema: Secondary | ICD-10-CM | POA: Diagnosis not present

## 2019-07-07 DIAGNOSIS — Z8673 Personal history of transient ischemic attack (TIA), and cerebral infarction without residual deficits: Secondary | ICD-10-CM | POA: Diagnosis not present

## 2019-07-07 DIAGNOSIS — Z833 Family history of diabetes mellitus: Secondary | ICD-10-CM | POA: Diagnosis not present

## 2019-07-07 DIAGNOSIS — I252 Old myocardial infarction: Secondary | ICD-10-CM | POA: Diagnosis not present

## 2019-07-07 DIAGNOSIS — Z7984 Long term (current) use of oral hypoglycemic drugs: Secondary | ICD-10-CM | POA: Insufficient documentation

## 2019-07-07 DIAGNOSIS — L089 Local infection of the skin and subcutaneous tissue, unspecified: Secondary | ICD-10-CM | POA: Diagnosis not present

## 2019-07-07 DIAGNOSIS — E11319 Type 2 diabetes mellitus with unspecified diabetic retinopathy without macular edema: Secondary | ICD-10-CM | POA: Diagnosis not present

## 2019-07-07 DIAGNOSIS — L729 Follicular cyst of the skin and subcutaneous tissue, unspecified: Secondary | ICD-10-CM | POA: Diagnosis not present

## 2019-07-07 DIAGNOSIS — Z79899 Other long term (current) drug therapy: Secondary | ICD-10-CM | POA: Insufficient documentation

## 2019-07-07 DIAGNOSIS — I5030 Unspecified diastolic (congestive) heart failure: Secondary | ICD-10-CM | POA: Diagnosis not present

## 2019-07-07 DIAGNOSIS — Z87891 Personal history of nicotine dependence: Secondary | ICD-10-CM | POA: Insufficient documentation

## 2019-07-07 DIAGNOSIS — E1142 Type 2 diabetes mellitus with diabetic polyneuropathy: Secondary | ICD-10-CM | POA: Insufficient documentation

## 2019-07-07 LAB — GLUCOSE, POCT (MANUAL RESULT ENTRY): POC Glucose: 180 mg/dl — AB (ref 70–99)

## 2019-07-07 MED ORDER — CLINDAMYCIN HCL 300 MG PO CAPS: 300.0000 mg | ORAL_CAPSULE | Freq: Three times a day (TID) | ORAL | 0 refills | Status: DC

## 2019-07-07 MED ORDER — FARXIGA 10 MG PO TABS: 10.0000 mg | ORAL_TABLET | Freq: Every day | ORAL | 6 refills | Status: DC

## 2019-07-07 NOTE — Patient Instructions (Signed)
Increase Farxiga to 10 mg daily. Try to check your blood sugars at least once a day before meals.  The goal is 90-130.  Your blood pressure was elevated today.  Please continue to take your current blood pressure medications and limit salt in the foods.  I have sent a prescription for an antibiotic called clindamycin to use for the infected cyst on your scalp.   Diabetes Mellitus and Nutrition, Adult When you have diabetes (diabetes mellitus), it is very important to have healthy eating habits because your blood sugar (glucose) levels are greatly affected by what you eat and drink. Eating healthy foods in the appropriate amounts, at about the same times every day, can help you:  Control your blood glucose.  Lower your risk of heart disease.  Improve your blood pressure.  Reach or maintain a healthy weight. Every person with diabetes is different, and each person has different needs for a meal plan. Your health care provider may recommend that you work with a diet and nutrition specialist (dietitian) to make a meal plan that is best for you. Your meal plan may vary depending on factors such as:  The calories you need.  The medicines you take.  Your weight.  Your blood glucose, blood pressure, and cholesterol levels.  Your activity level.  Other health conditions you have, such as heart or kidney disease. How do carbohydrates affect me? Carbohydrates, also called carbs, affect your blood glucose level more than any other type of food. Eating carbs naturally raises the amount of glucose in your blood. Carb counting is a method for keeping track of how many carbs you eat. Counting carbs is important to keep your blood glucose at a healthy level, especially if you use insulin or take certain oral diabetes medicines. It is important to know how many carbs you can safely have in each meal. This is different for every person. Your dietitian can help you calculate how many carbs you should  have at each meal and for each snack. Foods that contain carbs include:  Bread, cereal, rice, pasta, and crackers.  Potatoes and corn.  Peas, beans, and lentils.  Milk and yogurt.  Fruit and juice.  Desserts, such as cakes, cookies, ice cream, and candy. How does alcohol affect me? Alcohol can cause a sudden decrease in blood glucose (hypoglycemia), especially if you use insulin or take certain oral diabetes medicines. Hypoglycemia can be a life-threatening condition. Symptoms of hypoglycemia (sleepiness, dizziness, and confusion) are similar to symptoms of having too much alcohol. If your health care provider says that alcohol is safe for you, follow these guidelines:  Limit alcohol intake to no more than 1 drink per day for nonpregnant women and 2 drinks per day for men. One drink equals 12 oz of beer, 5 oz of wine, or 1 oz of hard liquor.  Do not drink on an empty stomach.  Keep yourself hydrated with water, diet soda, or unsweetened iced tea.  Keep in mind that regular soda, juice, and other mixers may contain a lot of sugar and must be counted as carbs. What are tips for following this plan?  Reading food labels  Start by checking the serving size on the "Nutrition Facts" label of packaged foods and drinks. The amount of calories, carbs, fats, and other nutrients listed on the label is based on one serving of the item. Many items contain more than one serving per package.  Check the total grams (g) of carbs in one serving. You  can calculate the number of servings of carbs in one serving by dividing the total carbs by 15. For example, if a food has 30 g of total carbs, it would be equal to 2 servings of carbs.  Check the number of grams (g) of saturated and trans fats in one serving. Choose foods that have low or no amount of these fats.  Check the number of milligrams (mg) of salt (sodium) in one serving. Most people should limit total sodium intake to less than 2,300 mg per  day.  Always check the nutrition information of foods labeled as "low-fat" or "nonfat". These foods may be higher in added sugar or refined carbs and should be avoided.  Talk to your dietitian to identify your daily goals for nutrients listed on the label. Shopping  Avoid buying canned, premade, or processed foods. These foods tend to be high in fat, sodium, and added sugar.  Shop around the outside edge of the grocery store. This includes fresh fruits and vegetables, bulk grains, fresh meats, and fresh dairy. Cooking  Use low-heat cooking methods, such as baking, instead of high-heat cooking methods like deep frying.  Cook using healthy oils, such as olive, canola, or sunflower oil.  Avoid cooking with butter, cream, or high-fat meats. Meal planning  Eat meals and snacks regularly, preferably at the same times every day. Avoid going long periods of time without eating.  Eat foods high in fiber, such as fresh fruits, vegetables, beans, and whole grains. Talk to your dietitian about how many servings of carbs you can eat at each meal.  Eat 4-6 ounces (oz) of lean protein each day, such as lean meat, chicken, fish, eggs, or tofu. One oz of lean protein is equal to: ? 1 oz of meat, chicken, or fish. ? 1 egg. ?  cup of tofu.  Eat some foods each day that contain healthy fats, such as avocado, nuts, seeds, and fish. Lifestyle  Check your blood glucose regularly.  Exercise regularly as told by your health care provider. This may include: ? 150 minutes of moderate-intensity or vigorous-intensity exercise each week. This could be brisk walking, biking, or water aerobics. ? Stretching and doing strength exercises, such as yoga or weightlifting, at least 2 times a week.  Take medicines as told by your health care provider.  Do not use any products that contain nicotine or tobacco, such as cigarettes and e-cigarettes. If you need help quitting, ask your health care provider.  Work with  a Social worker or diabetes educator to identify strategies to manage stress and any emotional and social challenges. Questions to ask a health care provider  Do I need to meet with a diabetes educator?  Do I need to meet with a dietitian?  What number can I call if I have questions?  When are the best times to check my blood glucose? Where to find more information:  American Diabetes Association: diabetes.org  Academy of Nutrition and Dietetics: www.eatright.[employer] of Diabetes and Digestive and Kidney Diseases (NIH): [employer] Summary  A healthy meal plan will help you control your blood glucose and maintain a healthy lifestyle.  Working with a diet and nutrition specialist (dietitian) can help you make a meal plan that is best for you.  Keep in mind that carbohydrates (carbs) and alcohol have immediate effects on your blood glucose levels. It is important to count carbs and to use alcohol carefully. This information is not intended to replace advice given to you by  your health care provider. Make sure you discuss any questions you have with your health care provider. Document Released: 04/06/2005 Document Revised: 06/22/2017 Document Reviewed: 08/14/2016 Elsevier Patient Education  2020 [employer].

## 2019-07-07 NOTE — Progress Notes (Signed)
Patient ID: [name], male    DOB: 1960-09-20  MRN: [id number]  CC: Diabetes and Hypertension   Subjective: Quaran [name] is a 58 y.o. male who presents for chronic ds management His concerns today include:  Pt withhistory ofDM2 with retinopathy and neuropathy, HTN, dCHF, formertob dep, HL, CVA (about 3 yrs ago per pt), spinal stenosis of lumbar spine,chronic lower extremity edema.  C/o draining spot on posterior scalp x 3-4 mths.  Hurts at times.  No known trauma or insect bites.  HYPERTENSION/CHF Currently taking: see medication list Med Adherence: _0  Yes but not as yet for the morning   _1  No Medication side effects: _2  Yes    _3  No Adherence with salt restriction: _4  Yes    _5  No Home Monitoring?: _6  Yes    _7  No Monitoring Frequency: _8  Yes    _9  No Home BP results range: _10  Yes    _11  No SOB? _12  Yes    _13  No Chest Pain?: _14  Yes    _15  No Leg swelling?: _16  Yes    _17  No Headaches?: _18  Yes    _19  No Dizziness? _20  Yes    _21  No Comments:  No PND.  wgh down 9 lbs  DIABETES TYPE 2 Last A1C:   Results for orders placed or performed in visit on 07/07/19  Glucose (CBG)  Result Value Ref Range   POC Glucose 180 (A) 70 - 99 mg/dl    Med Adherence:  _22  Yes -he is on Metformin, glipizide and Farxiga.  Wilder Glade was added on last visit.   _23  No Medication side effects:  _24  Yes    _25  No Home Monitoring?  _26  Yes    _27  No - out of stripes and was not able to get to pharmacy to get them Home glucose results range: Diet Adherence: appetite has been up and down.  Drinks OJ and green tea.  Eat wheat bread, meats -eats a lot of sausage, hot dog and chicken Exercise: _28  Yes    _29  No - does not get out much Hypoglycemic episodes?: _30  Yes    _31  No Numbness of the feet? _32  Yes    _33  No Retinopathy hx? _34  Yes    _35  No Last eye exam:  Comments:  Some callous on Lt foot.  Followed by podiatry  No recent falls.  Ambulates with rollator.     Patient Active Problem  List   Diagnosis Date Noted  . History of spinal cord injury 04/17/2019  . TIA (transient ischemic attack) 03/05/2018  . Benign essential HTN 03/05/2018  . Sensorineural hearing loss of both ears 12/04/2017  . Mild nonproliferative diabetic retinopathy ([place]) 01/10/2016  . Hypertensive retinopathy of both eyes 01/10/2016  . Spinal stenosis of lumbar region 07/15/2015  . Abnormality of gait 06/07/2015  . Hydrocephalus ([place]) 06/07/2015  . Peripheral neuropathy 04/26/2015  . Constipation 04/26/2015  . Diabetes mellitus type 2 with complications ([place]) 38/25/0539  . Decreased hearing of both ears 03/15/2015  . Tinea pedis 03/15/2015  . Callus of foot 05/22/2014  . Cocaine abuse ([place]) 12/28/2013  . Left-sided weakness 12/26/2013  . Hyperlipidemia associated with type 2 diabetes mellitus ([place]) 04/01/2007  . Class 2 severe obesity due to excess calories with serious comorbidity and body mass index (BMI) of 36.0 to 36.9 in adult ([place]) 03/28/2007  . Essential hypertension 03/28/2007     Current Outpatient Medications on File Prior to Visit  Medication Sig Dispense Refill  . acetaminophen (TYLENOL)  500 MG tablet TAKE ONE TABLET BY MOUTH TWICE A DAY AS NEEDED 60 tablet 0  . aspirin EC 81 MG tablet Take 1 tablet (81 mg total) by mouth daily. 30 tablet 4  . atorvastatin (LIPITOR) 40 MG tablet Take 1.5 tablets (60 mg total) by mouth daily. 45 tablet 6  . betamethasone dipropionate 0.05 % lotion     . Blood Glucose Monitoring Suppl (TRUE METRIX METER) w/Device KIT Use as directed 1 kit 0  . DULoxetine (CYMBALTA) 60 MG capsule Take 1 capsule (60 mg total) by mouth daily. For back and leg pains 30 capsule 3  . furosemide (LASIX) 40 MG tablet TAKE ONE TABLET BY MOUTH DAILY . TAKE AN EXTRA TABLET FOR TWO DAYS AS NEEDED FOR INCREASE LOWER EXTREMITY SWELLING 40 tablet 2  . gabapentin (NEURONTIN) 100 MG capsule Take 2 capsules (200 mg total) by mouth 2 (two) times daily. 120 capsule 6  . glipiZIDE  (GLUCOTROL XL) 10 MG 24 hr tablet Take 1 tablet (10 mg total) by mouth daily with breakfast. 30 tablet 6  . glucose blood (TRUE METRIX BLOOD GLUCOSE TEST) test strip Check blood sugars 1-2 times daily 100 each 12  . glucose blood (TRUE METRIX BLOOD GLUCOSE TEST) test strip Test BS twice a day before meals 100 each 12  . hydrALAZINE (APRESOLINE) 10 MG tablet TAKE ONE TABLET BY MOUTH TWICE A DAY 60 tablet 1  . labetalol (NORMODYNE) 100 MG tablet TAKE ONE TABLET BY MOUTH TWICE A DAY 180 tablet 0  . Lancets Misc. (ACCU-CHEK SOFTCLIX LANCET DEV) KIT Use as directed 1 kit 0  . lisinopril (ZESTRIL) 40 MG tablet TAKE ONE TABLET BY MOUTH DAILY 90 tablet 0  . loratadine (CLARITIN) 10 MG tablet TAKE ONE TABLET BY MOUTH DAILY AS NEEDED FOR ALLERGIES 30 tablet 2  . metFORMIN (GLUCOPHAGE) 1000 MG tablet Take 1 tablet (1,000 mg total) by mouth 2 (two) times daily with a meal. 180 tablet 3  . triamcinolone cream (KENALOG) 0.1 % APPLY 1 APPLICATION TOPICALLY TWO (TWO) TIMES DAILY. 45 g 0  . TRUEPLUS LANCETS 28G MISC Use as directed 100 each 6   No current facility-administered medications on file prior to visit.    Allergies  Allergen Reactions  . Amlodipine Swelling    Lower extremity swelling    Social History   Socioeconomic History  . Marital status: Divorced    Spouse name: Not on file  . Number of children: 0  . Years of education: 10  . Highest education level: Not on file  Occupational History  . Occupation: Unemployed  Tobacco Use  . Smoking status: Former Smoker    Years: 45.00    Types: Cigarettes    Quit date: 03/03/2018    Years since quitting: 1.3  . Smokeless tobacco: Never Used  . Tobacco comment: 11-15-2017  per pt 1pp3days  Substance and Sexual Activity  . Alcohol use: Not Currently    Alcohol/week: 0.0 standard drinks    Comment: 11-15-2017  per pt last alcohol 12/ 2018  alcohol abuse  . Drug use: Not Currently    Types: "Crack" cocaine, Cocaine    Comment: 11-15-2017   per pt last used March 2019  . Sexual activity: Not Currently  Other Topics Concern  . Not on file  Social History Narrative   Lives at home alone.   Right-handed.   Occasional use.   Social Determinants of Health   Financial Resource Strain:   . Difficulty of Paying Living Expenses:  Not on file  Food Insecurity:   . Worried About Charity fundraiser in the Last Year: Not on file  . Ran Out of Food in the Last Year: Not on file  Transportation Needs:   . Lack of Transportation (Medical): Not on file  . Lack of Transportation (Non-Medical): Not on file  Physical Activity:   . Days of Exercise per Week: Not on file  . Minutes of Exercise per Session: Not on file  Stress:   . Feeling of Stress : Not on file  Social Connections:   . Frequency of Communication with Friends and Family: Not on file  . Frequency of Social Gatherings with Friends and Family: Not on file  . Attends Religious Services: Not on file  . Active Member of Clubs or Organizations: Not on file  . Attends Archivist Meetings: Not on file  . Marital Status: Not on file  Intimate Partner Violence:   . Fear of Current or Ex-Partner: Not on file  . Emotionally Abused: Not on file  . Physically Abused: Not on file  . Sexually Abused: Not on file    Family History  Problem Relation Age of Onset  . Hypertension Mother   . Diabetes Mother   . Cancer Mother        breast cancer   . Hypertension Father   . Heart disease Father   . Stroke Father   . Hypertension Sister     Past Surgical History:  Procedure Laterality Date  . CARDIAC CATHETERIZATION  12/01/2008   dr hochrein   abnormal stress myoview:  mild coronary plaque, normal LVF  . CARDIAC CATHETERIZATION  04/29/2011   dr [place]   in setting ECG with new ST elevation & severe hypertensive:  nonobstructive atherosclerotic CAD, normal LVF (30% mRCA)   . CARDIOVASCULAR STRESS TEST  08/15/2010   Low nuclear study w/ no evidence ishemia/  ef 42%  with lateral and apical hypokinesis  . CYSTOSCOPY WITH BIOPSY N/A 11/20/2017   Procedure: CYSTOSCOPY WITH BIOPSY AND FULGURATION;  Surgeon: [name], MD;  Location: [facility];  Service: Urology;  Laterality: N/A;  . INCISION AND DRAINAGE RIGHT DISTAL MEDIAL THIGH HEMATOMA  04-14-2005   dr Ninfa Linden  . left arm skin graft  1976   injury  . LEFT HEART CATHETERIZATION WITH CORONARY ANGIOGRAM Bilateral 12/26/2013   Procedure: LEFT HEART CATHETERIZATION WITH CORONARY ANGIOGRAM;  Surgeon: Burnell Blanks, MD;  Location: [facility] CATH LAB;  Service: Cardiovascular;  Laterality: Bilateral;  STEMI, in setting cocaine/ alcohol :  mid disease in the RCA (20%), moderate disease in the small caliber intermediate branch (distal 50-60%), normal LVSF (ef 55-60%)  . TOOTH EXTRACTION N/A 05/16/2018   Procedure: DENTAL RESTORATION/EXTRACTIONS WITH ALVEO;  Surgeon: Diona Browner, DDS;  Location: [facility];  Service: Oral Surgery;  Laterality: N/A;  . TRANSTHORACIC ECHOCARDIOGRAM  08/05/2017   moderate LVH,  ef 55%,  grade 1 diastolic dysfunction/  mild LAE/  trivial Tr    ROS: Review of Systems Negative except as stated above  PHYSICAL EXAM: BP (!) 149/91   Pulse 81   Temp 98 F (36.7 C) (Oral)   Wt 264 lb 12.8 oz (120.1 kg)   SpO2 97%   BMI 33.10 kg/m   Wt Readings from Last 3 Encounters:  07/07/19 264 lb 12.8 oz (120.1 kg)  07/03/19 265 lb 9.6 oz (120.5 kg)  05/21/19 270 lb (122.5 kg)   Physical Exam  General appearance -  alert, well appearing, and in no distress Mental status - normal mood, behavior, speech, dress, motor activity, and thought processes Neck - supple, no significant adenopathy Chest - clear to auscultation, no wheezes, rales or rhonchi, symmetric air entry Heart - normal rate, regular rhythm, normal S1, S2, no murmurs, rubs, clicks or gallops Extremities -trace lower extremity edema Skin -3 cm irregular soft mildly tender sac in the right  occipital area of the scalp.  It is fluctuant.  When I pressed on it it drained bloody fluid that appeared mixed with pus.  I was able to continue pressing until no further drainage occurred Diabetic Foot Exam - Simple   Simple Foot Form Visual Inspection See comments: Yes Sensation Testing See comments: Yes Pulse Check See comments: Yes Comments Patient with preulcerative callus on the plantar surface of the left big toe and ball of the left foot.  He has decreased sensation on the soles of both feet on leap exam.  Dorsalis pedis pulses decreased on the left.  Skin of both feet very dry.     BS:  180/A1C 8.9  CMP Latest Ref Rng & Units 04/17/2019 05/13/2018 03/05/2018  Glucose 65 - 99 mg/dL 201(H) 171(H) 148(H)  BUN 8 - 27 mg/dL 22 21(H) 23(H)  Creatinine 0.76 - 1.27 mg/dL 1.42(H) 1.16 1.19  Sodium 134 - 144 mmol/L 141 139 141  Potassium 3.5 - 5.2 mmol/L 4.8 4.8 4.8  Chloride 96 - 106 mmol/L 105 105 108  CO2 20 - 29 mmol/L _0 Calcium 8.6 - 10.2 mg/dL 9.5 9.2 9.5  Total Protein 6.0 - 8.5 g/dL 7.2 - 7.1  Total Bilirubin 0.0 - 1.2 mg/dL 0.3 - 0.7  Alkaline Phos 39 - 117 IU/L 75 - 58  AST 0 - 40 IU/L 15 - 18  ALT 0 - 44 IU/L 14 - 17   Lipid Panel     Component Value Date/Time   CHOL 176 04/17/2019 1034   TRIG 174 (H) 04/17/2019 1034   HDL 52 04/17/2019 1034   CHOLHDL 3.4 04/17/2019 1034   CHOLHDL 3.4 03/05/2018 0731   VLDL 18 03/05/2018 0731   LDLCALC 94 04/17/2019 1034    CBC    Component Value Date/Time   WBC 9.0 04/17/2019 1034   WBC 7.1 03/05/2018 0731   RBC 4.69 04/17/2019 1034   RBC 4.86 03/05/2018 0731   HGB 13.3 04/17/2019 1034   HCT 39.3 04/17/2019 1034   PLT 233 04/17/2019 1034   MCV 84 04/17/2019 1034   MCH 28.4 04/17/2019 1034   MCH 27.8 03/05/2018 0731   MCHC 33.8 04/17/2019 1034   MCHC 31.9 03/05/2018 0731   RDW 12.7 04/17/2019 1034   LYMPHSABS 1.5 08/04/2017 1052   MONOABS 1.2 (H) 08/04/2017 1052   EOSABS 0.1 08/04/2017 1052   BASOSABS  0.0 08/04/2017 1052    ASSESSMENT AND PLAN: 1. Diabetes mellitus type 2 with complications (HCC) U3J has improved from last visit but not at goal.  He is still not interested in having insulin or other injectables added.  We will increase [place].  Check BMP today.  Dietary counseling given.  Encouraged him to try to move more - Glucose (CBG) - dapagliflozin propanediol (FARXIGA) 10 MG TABS tablet; Take 10 mg by mouth daily before breakfast.  Dispense: 30 tablet; Refill: 6  2. Essential hypertension Not at goal.  Patient has not taken medicines as yet for today.  No new medications added - Basic Metabolic Panel  3. Pre-ulcerative calluses  Followed by podiatry  4. Diastolic congestive heart failure, unspecified HF chronicity ([place]) Compensated with some decrease in weight since last visit.  He will continue current medications including furosemide  5. Infected cyst of skin - Ambulatory referral to Dermatology - clindamycin (CLEOCIN) 300 MG capsule; Take 1 capsule (300 mg total) by mouth 3 (three) times daily.  Dispense: 21 capsule; Refill: 0     Patient was given the opportunity to ask questions.  Patient verbalized understanding of the plan and was able to repeat key elements of the plan.   Orders Placed This Encounter  Procedures  . Basic Metabolic Panel  . Ambulatory referral to Dermatology  . Glucose (CBG)     Requested Prescriptions   Signed Prescriptions Disp Refills  . dapagliflozin propanediol (FARXIGA) 10 MG TABS tablet 30 tablet 6    Sig: Take 10 mg by mouth daily before breakfast.  . clindamycin (CLEOCIN) 300 MG capsule 21 capsule 0    Sig: Take 1 capsule (300 mg total) by mouth 3 (three) times daily.    Return in about 4 months (around 11/05/2019).  Karle Plumber, MD, FACP

## 2019-07-08 ENCOUNTER — Ambulatory Visit (INDEPENDENT_AMBULATORY_CARE_PROVIDER_SITE_OTHER): Payer: Medicare Other | Admitting: Podiatry

## 2019-07-08 ENCOUNTER — Encounter: Payer: Self-pay | Admitting: Podiatry

## 2019-07-08 ENCOUNTER — Other Ambulatory Visit: Payer: Self-pay

## 2019-07-08 DIAGNOSIS — L84 Corns and callosities: Secondary | ICD-10-CM | POA: Diagnosis not present

## 2019-07-08 DIAGNOSIS — M79675 Pain in left toe(s): Secondary | ICD-10-CM | POA: Diagnosis not present

## 2019-07-08 DIAGNOSIS — M79674 Pain in right toe(s): Secondary | ICD-10-CM | POA: Diagnosis not present

## 2019-07-08 DIAGNOSIS — E1142 Type 2 diabetes mellitus with diabetic polyneuropathy: Secondary | ICD-10-CM | POA: Diagnosis not present

## 2019-07-08 DIAGNOSIS — B351 Tinea unguium: Secondary | ICD-10-CM

## 2019-07-08 LAB — BASIC METABOLIC PANEL
BUN/Creatinine Ratio: 14 (ref 10–24)
BUN: 16 mg/dL (ref 8–27)
CO2: 23 mmol/L (ref 20–29)
Calcium: 9.4 mg/dL (ref 8.6–10.2)
Chloride: 106 mmol/L (ref 96–106)
Creatinine, Ser: 1.18 mg/dL (ref 0.76–1.27)
GFR calc Af Amer: 77 mL/min/{1.73_m2} (ref >59)
GFR calc non Af Amer: 67 mL/min/{1.73_m2} (ref >59)
Glucose: 142 mg/dL — ABNORMAL HIGH (ref 65–99)
Potassium: 5.2 mmol/L (ref 3.5–5.2)
Sodium: 145 mmol/L — ABNORMAL HIGH (ref 134–144)

## 2019-07-08 NOTE — Progress Notes (Signed)
Subjective: [name] is a 58 y.o. y.o. male who presents for preventative diabetic foot care today with calluses and mycotic toenails which interfere with daily activities. Pain is aggravated when wearing enclosed shoe gear and relieved with periodic professional debridement.  Curtis Pier, [name] is his PCP.   Medications reviewed in chart.  Allergies  Allergen Reactions  . Amlodipine Swelling    Lower extremity swelling   Objective: There were no vitals filed for this visit.  Vascular Examination: Capillary refill time to digits <3 seconds b/l.  Dorsalis pedis pulses faintly palpable b/l.  Posterior tibial pulses faintly palpable b/l.  Digital hair absent b/l.  Skin temperature gradient WNL b/l.  Dermatological Examination: Skin with normal turgor, texture and tone b/l.  Toenails 1-5 b/l discolored, thick, dystrophic with subungual debris and pain with palpation to nailbeds due to thickness of nails.  Incurvated nailplate right great toe medial border with tenderness to palpation. No erythema, no edema, no drainage noted.  Hyperkeratotic lesion submet head 4 left foot, hallux left foot. No edema, no erythema, no drainage, no flocculence.  Musculoskeletal: Muscle strength 5/5 to all LE muscle groups b/l.  Neurological: Sensation diminished with 10 gram monofilament b/l.  Assessment: 1. Painful onychomycosis toenails 1-5 b/l 2. Ingrown toenail right hallux, noninfected 2.  Calluses submet head 4 left foot, hallux left foot. 3.  NIDDM with neuropathy  Plan: 1. Continue diabetic foot care principles. Literature dispensed on today. 2. Toenails 1-5 b/l were debrided in length and girth without iatrogenic bleeding.Offending nail border debrided and curretaged right hallux. Border cleansed with alcohol and triple antibiotic applied.[name] instructed to apply triple antibiotic ointment to right great toe once daily for one week.  Call if he has any  problems. 3. Calluses pared submet head 4 left foot, hallux left foot utilizing sterile scalpel blade without incident.   4. Patient to continue soft, supportive shoe gear daily. 5. Patient to report any pedal injuries to medical professional immediately. 6. Follow up 9 weeks.  7. Patient/POA to call should there be a concern in the interim.

## 2019-07-08 NOTE — Patient Instructions (Signed)
Diabetes Mellitus and Foot Care Foot care is an important part of your health, especially when you have diabetes. Diabetes may cause you to have problems because of poor blood flow (circulation) to your feet and legs, which can cause your skin to:  Become thinner and drier.  Break more easily.  Heal more slowly.  Peel and crack. You may also have nerve damage (neuropathy) in your legs and feet, causing decreased feeling in them. This means that you may not notice minor injuries to your feet that could lead to more serious problems. Noticing and addressing any potential problems early is the best way to prevent future foot problems. How to care for your feet Foot hygiene  Wash your feet daily with warm water and mild soap. Do not use hot water. Then, pat your feet and the areas between your toes until they are completely dry. Do not soak your feet as this can dry your skin.  Trim your toenails straight across. Do not dig under them or around the cuticle. File the edges of your nails with an emery board or nail file.  Apply a moisturizing lotion or petroleum jelly to the skin on your feet and to dry, brittle toenails. Use lotion that does not contain alcohol and is unscented. Do not apply lotion between your toes. Shoes and socks  Wear clean socks or stockings every day. Make sure they are not too tight. Do not wear knee-high stockings since they may decrease blood flow to your legs.  Wear shoes that fit properly and have enough cushioning. Always look in your shoes before you put them on to be sure there are no objects inside.  To break in new shoes, wear them for just a few hours a day. This prevents injuries on your feet. Wounds, scrapes, corns, and calluses  Check your feet daily for blisters, cuts, bruises, sores, and redness. If you cannot see the bottom of your feet, use a mirror or ask someone for help.  Do not cut corns or calluses or try to remove them with medicine.  If you  find a minor scrape, cut, or break in the skin on your feet, keep it and the skin around it clean and dry. You may clean these areas with mild soap and water. Do not clean the area with peroxide, alcohol, or iodine.  If you have a wound, scrape, corn, or callus on your foot, look at it several times a day to make sure it is healing and not infected. Check for: ? Redness, swelling, or pain. ? Fluid or blood. ? Warmth. ? Pus or a bad smell. General instructions  Do not cross your legs. This may decrease blood flow to your feet.  Do not use heating pads or hot water bottles on your feet. They may burn your skin. If you have lost feeling in your feet or legs, you may not know this is happening until it is too late.  Protect your feet from hot and cold by wearing shoes, such as at the beach or on hot pavement.  Schedule a complete foot exam at least once a year (annually) or more often if you have foot problems. If you have foot problems, report any cuts, sores, or bruises to your health care provider immediately. Contact a health care provider if:  You have a medical condition that increases your risk of infection and you have any cuts, sores, or bruises on your feet.  You have an injury that is not   healing.  You have redness on your legs or feet.  You feel burning or tingling in your legs or feet.  You have pain or cramps in your legs and feet.  Your legs or feet are numb.  Your feet always feel cold.  You have pain around a toenail. Get help right away if:  You have a wound, scrape, corn, or callus on your foot and: ? You have pain, swelling, or redness that gets worse. ? You have fluid or blood coming from the wound, scrape, corn, or callus. ? Your wound, scrape, corn, or callus feels warm to the touch. ? You have pus or a bad smell coming from the wound, scrape, corn, or callus. ? You have a fever. ? You have a red line going up your leg. Summary  Check your feet every day  for cuts, sores, red spots, swelling, and blisters.  Moisturize feet and legs daily.  Wear shoes that fit properly and have enough cushioning.  If you have foot problems, report any cuts, sores, or bruises to your health care provider immediately.  Schedule a complete foot exam at least once a year (annually) or more often if you have foot problems. This information is not intended to replace advice given to you by your health care provider. Make sure you discuss any questions you have with your health care provider. Document Released: 07/07/2000 Document Revised: 08/22/2017 Document Reviewed: 08/11/2016 Elsevier Patient Education  2020 [employer]  

## 2019-07-13 ENCOUNTER — Emergency Department (HOSPITAL_COMMUNITY): Payer: Medicare Other

## 2019-07-13 ENCOUNTER — Emergency Department (HOSPITAL_COMMUNITY)
Admission: EM | Admit: 2019-07-13 | Discharge: 2019-07-15 | Payer: Medicare Other | Attending: Emergency Medicine | Admitting: Emergency Medicine

## 2019-07-13 DIAGNOSIS — Z7982 Long term (current) use of aspirin: Secondary | ICD-10-CM | POA: Diagnosis not present

## 2019-07-13 DIAGNOSIS — Z7984 Long term (current) use of oral hypoglycemic drugs: Secondary | ICD-10-CM | POA: Insufficient documentation

## 2019-07-13 DIAGNOSIS — G9389 Other specified disorders of brain: Secondary | ICD-10-CM | POA: Diagnosis not present

## 2019-07-13 DIAGNOSIS — Z79899 Other long term (current) drug therapy: Secondary | ICD-10-CM | POA: Insufficient documentation

## 2019-07-13 DIAGNOSIS — R269 Unspecified abnormalities of gait and mobility: Secondary | ICD-10-CM | POA: Diagnosis not present

## 2019-07-13 DIAGNOSIS — I130 Hypertensive heart and chronic kidney disease with heart failure and stage 1 through stage 4 chronic kidney disease, or unspecified chronic kidney disease: Secondary | ICD-10-CM | POA: Diagnosis not present

## 2019-07-13 DIAGNOSIS — E669 Obesity, unspecified: Secondary | ICD-10-CM | POA: Diagnosis not present

## 2019-07-13 DIAGNOSIS — Z20828 Contact with and (suspected) exposure to other viral communicable diseases: Secondary | ICD-10-CM | POA: Insufficient documentation

## 2019-07-13 DIAGNOSIS — I252 Old myocardial infarction: Secondary | ICD-10-CM | POA: Diagnosis not present

## 2019-07-13 DIAGNOSIS — E1122 Type 2 diabetes mellitus with diabetic chronic kidney disease: Secondary | ICD-10-CM | POA: Diagnosis not present

## 2019-07-13 DIAGNOSIS — N182 Chronic kidney disease, stage 2 (mild): Secondary | ICD-10-CM | POA: Diagnosis not present

## 2019-07-13 DIAGNOSIS — Z6832 Body mass index (BMI) 32.0-32.9, adult: Secondary | ICD-10-CM | POA: Insufficient documentation

## 2019-07-13 DIAGNOSIS — Z87891 Personal history of nicotine dependence: Secondary | ICD-10-CM | POA: Insufficient documentation

## 2019-07-13 DIAGNOSIS — R2689 Other abnormalities of gait and mobility: Secondary | ICD-10-CM | POA: Diagnosis not present

## 2019-07-13 DIAGNOSIS — I5032 Chronic diastolic (congestive) heart failure: Secondary | ICD-10-CM | POA: Insufficient documentation

## 2019-07-13 DIAGNOSIS — R531 Weakness: Secondary | ICD-10-CM | POA: Diagnosis not present

## 2019-07-13 DIAGNOSIS — Z8673 Personal history of transient ischemic attack (TIA), and cerebral infarction without residual deficits: Secondary | ICD-10-CM | POA: Insufficient documentation

## 2019-07-13 LAB — COMPREHENSIVE METABOLIC PANEL
ALT: 15 U/L (ref 0–44)
AST: 19 U/L (ref 15–41)
Albumin: 3.8 g/dL (ref 3.5–5.0)
Alkaline Phosphatase: 59 U/L (ref 38–126)
Anion gap: 12 (ref 5–15)
BUN: 17 mg/dL (ref 6–20)
CO2: 21 mmol/L — ABNORMAL LOW (ref 22–32)
Calcium: 9.2 mg/dL (ref 8.9–10.3)
Chloride: 104 mmol/L (ref 98–111)
Creatinine, Ser: 1.29 mg/dL — ABNORMAL HIGH (ref 0.61–1.24)
GFR calc Af Amer: >60 mL/min (ref >60)
GFR calc non Af Amer: 60 mL/min — ABNORMAL LOW (ref >60)
Glucose, Bld: 145 mg/dL — ABNORMAL HIGH (ref 70–99)
Potassium: 4.5 mmol/L (ref 3.5–5.1)
Sodium: 137 mmol/L (ref 135–145)
Total Bilirubin: 0.7 mg/dL (ref 0.3–1.2)
Total Protein: 7.3 g/dL (ref 6.5–8.1)

## 2019-07-13 LAB — I-STAT CHEM 8, ED
BUN: 19 mg/dL (ref 6–20)
Calcium, Ion: 1.09 mmol/L — ABNORMAL LOW (ref 1.15–1.40)
Chloride: 104 mmol/L (ref 98–111)
Creatinine, Ser: 1.1 mg/dL (ref 0.61–1.24)
Glucose, Bld: 137 mg/dL — ABNORMAL HIGH (ref 70–99)
HCT: 44 % (ref 39.0–52.0)
Hemoglobin: 15 g/dL (ref 13.0–17.0)
Potassium: 4.4 mmol/L (ref 3.5–5.1)
Sodium: 137 mmol/L (ref 135–145)
TCO2: 25 mmol/L (ref 22–32)

## 2019-07-13 LAB — CBC
HCT: 42.6 % (ref 39.0–52.0)
Hemoglobin: 13.9 g/dL (ref 13.0–17.0)
MCH: 27.9 pg (ref 26.0–34.0)
MCHC: 32.6 g/dL (ref 30.0–36.0)
MCV: 85.4 fL (ref 80.0–100.0)
Platelets: 260 10*3/uL (ref 150–400)
RBC: 4.99 MIL/uL (ref 4.22–5.81)
RDW: 13.5 % (ref 11.5–15.5)
WBC: 13.3 10*3/uL — ABNORMAL HIGH (ref 4.0–10.5)
nRBC: 0 % (ref 0.0–0.2)

## 2019-07-13 LAB — CBG MONITORING, ED: Glucose-Capillary: 124 mg/dL — ABNORMAL HIGH (ref 70–99)

## 2019-07-13 LAB — PROTIME-INR
INR: 1 (ref 0.8–1.2)
Prothrombin Time: 13 seconds (ref 11.4–15.2)

## 2019-07-13 LAB — DIFFERENTIAL
Abs Immature Granulocytes: 0.08 10*3/uL — ABNORMAL HIGH (ref 0.00–0.07)
Basophils Absolute: 0.1 10*3/uL (ref 0.0–0.1)
Basophils Relative: 1 %
Eosinophils Absolute: 0.1 10*3/uL (ref 0.0–0.5)
Eosinophils Relative: 0 %
Immature Granulocytes: 1 %
Lymphocytes Relative: 14 %
Lymphs Abs: 1.8 10*3/uL (ref 0.7–4.0)
Monocytes Absolute: 1 10*3/uL (ref 0.1–1.0)
Monocytes Relative: 8 %
Neutro Abs: 10.3 10*3/uL — ABNORMAL HIGH (ref 1.7–7.7)
Neutrophils Relative %: 76 %

## 2019-07-13 LAB — APTT: aPTT: 30 seconds (ref 24–36)

## 2019-07-13 MED ORDER — ACETAMINOPHEN 500 MG PO TABS
1000.0000 mg | ORAL_TABLET | Freq: Once | ORAL | Status: AC
  Administered 2019-07-13: 20:00:00 1000 mg via ORAL
  Filled 2019-07-13: qty 2

## 2019-07-13 MED ORDER — SODIUM CHLORIDE 0.9% FLUSH: 3.0000 mL | Freq: Once | INTRAVENOUS | Status: DC

## 2019-07-13 NOTE — ED Notes (Signed)
Returned from MRI 

## 2019-07-13 NOTE — ED Notes (Signed)
Patient transported to MRI 

## 2019-07-13 NOTE — Consult Note (Addendum)
NEURO HOSPITALIST  CONSULT   Requesting Physician: Dr. Maryan Rued    Chief Complaint:  Left side weakness  History obtained from:  Patient  / chart review  HPI:                                                                                                                                         [name] is an 58 y.o. male  With PMH left side weakness ( d/t spinal cord contusion with residual left side weakness, CVA ( 2012 with no residual deficits), cocaine abuse, STEMI, CKD, HTn, HLD who presented to [facility] ED as a code stroke for left side weakness. Code stroke was later canceled.    Per patient he was at home when about 1600 ( LSW) he decided to get up out of his chair. At this time he noticed that his left side waa weaker than usual and he was unable to get out of the chair. Uses rollator walker at baseline to ambulate. Endorses daily asa and smoking cigarettes. He states he smokes about 2 cigarettes every 3 days. Denies ETOH, drug abuse, CP, HA, vision problems, numbness/ tingling.   ED course:  CTH: negative for hemorrhage. BP: 163/91 BG: 124  Per neurology f/u note 07/03/2019: f/u for reoccurring stroke symptoms ,02/04/2019 was f/u for TIA vs conversion disorder vspatient was  Initially seen 04/26/18 in f/u for right side weakness. Stroke work-up was negative for acute stroke. Patient has left hemiparesis that was chronic. was  Modified Rankin: Rankin Score=3  NIHSS:4     Past Medical History:  Diagnosis Date  . Alcohol abuse    11-15-2017  per pt last alcohol Dec 2018  . BPH with obstruction/lower urinary tract symptoms   . Chronic arm pain   . Chronic pain    arms, leg, back  . CKD (chronic kidney disease), stage II   . Cocaine abuse ([place])    11-15-2017  per pt last used March 2019  . Congenital hydrocephalus, unspecified (HCC)    slow progression  . Diabetic retinopathy of both eyes ([place])   . Diastolic CHF, chronic  ([place]) 07/2017  . Gait instability    multifactorial -- slow worsening congenital hydrocephalus, peripheral neuropathy, left C5-6 cord lesion  . Hematuria   . History of acute pulmonary edema 07/2017  . History of spinal cord injury 01/02/2014   pt fell, caused spinal cord contusion at C5-6--  residual left side weakness    . 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 History of TIA (transient ischemic attack) and stroke  hx cva 12-20-2010 and  TIA 02-22-2011--- no residual's from cva or tia  . Hyperlipidemia   . Hypertension    followed by pcp  . 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 Lesion of bladder   . Peripheral edema    chronic LUE  . Peripheral neuropathy   . Type 2 diabetes mellitus ([place])    followed by dr Karle Plumber--  last A1c 6.8 on 11-06-2017  . Weak urinary stream     Past Surgical History:  Procedure Laterality Date  . CARDIAC CATHETERIZATION  12/01/2008   dr hochrein   abnormal stress myoview:  mild coronary plaque, normal LVF  . CARDIAC CATHETERIZATION  04/29/2011   dr [place]   in setting ECG with new ST elevation & severe hypertensive:  nonobstructive atherosclerotic CAD, normal LVF (30% mRCA)   . CARDIOVASCULAR STRESS TEST  08/15/2010   Low nuclear study w/ no evidence ishemia/  ef 42% with lateral and apical hypokinesis  . CYSTOSCOPY WITH BIOPSY N/A 11/20/2017   Procedure: CYSTOSCOPY WITH BIOPSY AND FULGURATION;  Surgeon: [name], MD;  Location: [facility];  Service: Urology;  Laterality: N/A;  . INCISION AND DRAINAGE RIGHT DISTAL MEDIAL THIGH HEMATOMA  04-14-2005   dr Ninfa Linden  . left arm skin graft  1976   injury  . LEFT HEART CATHETERIZATION WITH CORONARY ANGIOGRAM Bilateral 12/26/2013   Procedure: LEFT HEART CATHETERIZATION WITH CORONARY ANGIOGRAM;  Surgeon: Burnell Blanks, MD;  Location: [facility] [place] CATH LAB;  Service: Cardiovascular;   Laterality: Bilateral;  STEMI, in setting cocaine/ alcohol :  mid disease in the RCA (20%), moderate disease in the small caliber intermediate branch (distal 50-60%), normal LVSF (ef 55-60%)  . TOOTH EXTRACTION N/A 05/16/2018   Procedure: DENTAL RESTORATION/EXTRACTIONS WITH ALVEO;  Surgeon: Diona Browner, DDS;  Location: [place];  Service: Oral Surgery;  Laterality: N/A;  . TRANSTHORACIC ECHOCARDIOGRAM  08/05/2017   moderate LVH,  ef 55%,  grade 1 diastolic dysfunction/  mild LAE/  trivial Tr    Family History  Problem Relation Age of Onset  . Hypertension Mother   . Diabetes Mother   . Cancer Mother        breast cancer   . Hypertension Father   . Heart disease Father   . Stroke Father   . Hypertension Sister          Social History:  reports that he quit smoking about 16 months ago. His smoking use included cigarettes. He quit after 45.00 years of use. He has never used smokeless tobacco. He reports previous alcohol use. He reports previous drug use. Drugs: "Crack" cocaine and Cocaine.  Allergies:  Allergies  Allergen Reactions  . Amlodipine Swelling    Lower extremity swelling    Medications:  Current Facility-Administered Medications  Medication Dose Route Frequency Provider Last Rate Last Admin  . sodium chloride flush (NS) 0.9 % injection 3 mL  3 mL Intravenous Once Blanchie Dessert, MD       Current Outpatient Medications  Medication Sig Dispense Refill  . acetaminophen (TYLENOL) 500 MG tablet TAKE ONE TABLET BY MOUTH TWICE A DAY AS NEEDED 60 tablet 0  . aspirin EC 81 MG tablet Take 1 tablet (81 mg total) by mouth daily. 30 tablet 4  . atorvastatin (LIPITOR) 40 MG tablet Take 1.5 tablets (60 mg total) by mouth daily. 45 tablet 6  . betamethasone dipropionate 0.05 % lotion     . Blood Glucose Monitoring Suppl (TRUE METRIX METER)  w/Device KIT Use as directed 1 kit 0  . clindamycin (CLEOCIN) 300 MG capsule Take 1 capsule (300 mg total) by mouth 3 (three) times daily. 21 capsule 0  . dapagliflozin propanediol (FARXIGA) 10 MG TABS tablet Take 10 mg by mouth daily before breakfast. 30 tablet 6  . DULoxetine (CYMBALTA) 60 MG capsule Take 1 capsule (60 mg total) by mouth daily. For back and leg pains 30 capsule 3  . furosemide (LASIX) 40 MG tablet TAKE ONE TABLET BY MOUTH DAILY . TAKE AN EXTRA TABLET FOR TWO DAYS AS NEEDED FOR INCREASE LOWER EXTREMITY SWELLING 40 tablet 2  . gabapentin (NEURONTIN) 100 MG capsule Take 2 capsules (200 mg total) by mouth 2 (two) times daily. 120 capsule 6  . glipiZIDE (GLUCOTROL XL) 10 MG 24 hr tablet Take 1 tablet (10 mg total) by mouth daily with breakfast. 30 tablet 6  . glucose blood (TRUE METRIX BLOOD GLUCOSE TEST) test strip Check blood sugars 1-2 times daily 100 each 12  . glucose blood (TRUE METRIX BLOOD GLUCOSE TEST) test strip Test BS twice a day before meals 100 each 12  . hydrALAZINE (APRESOLINE) 10 MG tablet TAKE ONE TABLET BY MOUTH TWICE A DAY 60 tablet 1  . labetalol (NORMODYNE) 100 MG tablet TAKE ONE TABLET BY MOUTH TWICE A DAY 180 tablet 0  . Lancets Misc. (ACCU-CHEK SOFTCLIX LANCET DEV) KIT Use as directed 1 kit 0  . lisinopril (ZESTRIL) 40 MG tablet TAKE ONE TABLET BY MOUTH DAILY 90 tablet 0  . loratadine (CLARITIN) 10 MG tablet TAKE ONE TABLET BY MOUTH DAILY AS NEEDED FOR ALLERGIES 30 tablet 2  . metFORMIN (GLUCOPHAGE) 1000 MG tablet Take 1 tablet (1,000 mg total) by mouth 2 (two) times daily with a meal. 180 tablet 3  . triamcinolone cream (KENALOG) 0.1 % APPLY 1 APPLICATION TOPICALLY TWO (TWO) TIMES DAILY. 45 g 0  . TRUEPLUS LANCETS 28G MISC Use as directed 100 each 6     ROS:                                                                                                                                       ROS was performed  and is negative except as noted in  HPI    General Examination:                                                                                                      Blood pressure (!) 163/91, pulse 93, temperature 98.4 F (36.9 C), temperature source Oral, resp. rate 15, height '6\' 3"'  (1.905 m), weight 118.3 kg, SpO2 99 %.  Physical Exam  Constitutional: Appears well-developed and well-nourished.  Psych: Affect appropriate to situation Eyes: Normal external eye and conjunctiva. HENT: Normocephalic, no lesions, without obvious abnormality.   Musculoskeletal-no joint tenderness, left hand with contracture Cardiovascular: Normal rate and regular rhythm.  Respiratory: Effort normal, non-labored breathing saturations WNL GI: Soft.  No distension. There is no tenderness.  Skin: WDI  Neurological Examination Mental Status: Alert, oriented, thought content appropriate.  Speech fluent without evidence of aphasia.  Able to follow  commands without difficulty. Cranial Nerves: PJ:ASNKNL fields grossly normal,  III,IV, VI: ptosis not present, extra-ocular motions intact bilaterally, pupils equal, round, reactive to light and accommodation V,VII: smile symmetric, facial light touch sensation normal bilaterally VIII: hearing normal bilaterally IX,X: uvula rises midline XI: bilateral shoulder shrug XII: midline tongue extension Motor: Right : Upper extremity   5/5  Left:     Upper extremity   4-/5  Lower extremity   5/5   Lower extremity   4-/5 increased tone in left leg w/o clonus Sensory:  light touch intact throughout, bilaterally Deep Tendon Reflexes: 2+ and symmetric biceps and patella Plantars: Right: downgoing   Left: downgoing Cerebellar: No ataxia noted Gait: deferred   Lab Results: Basic Metabolic Panel: Recent Labs  Lab 07/07/19 0947 07/13/19 1826  NA 145* 137  K 5.2 4.4  CL 106 104  CO2 23  --   GLUCOSE 142* 137*  BUN 16 19  CREATININE 1.18 1.10  CALCIUM 9.4  --     CBC: Recent Labs  Lab  07/13/19 1812 07/13/19 1826  WBC 13.3*  --   NEUTROABS 10.3*  --   HGB 13.9 15.0  HCT 42.6 44.0  MCV 85.4  --   PLT 260  --     CBG: Recent Labs  Lab 07/13/19 1832  GLUCAP 124*    Imaging: CT HEAD CODE STROKE WO CONTRAST  Result Date: 07/13/2019 CLINICAL DATA:  Code stroke.  Left hand weakness. EXAM: CT HEAD WITHOUT CONTRAST TECHNIQUE: Contiguous axial images were obtained from the base of the skull through the vertex without intravenous contrast. COMPARISON:  05/09/2019 FINDINGS: Brain: There is no evidence of acute infarct, intracranial hemorrhage, mass, midline shift, or extra-axial fluid collection. The ventricles are unchanged in size with evidence of moderate cerebral atrophy and absence of the septum pellucidum. Vascular: Calcified atherosclerosis at the skull base. No hyperdense vessel. Skull: No fracture or focal osseous lesion. Sinuses/Orbits: Mild ethmoid sinus mucosal. Thickening bilateral concha bullosa. Clear mastoid air cells. Unremarkable orbits. Other: Similar appearance of multiple superficial lesions in the posterior scalp. ASPECTS [facility] Stroke Program Early CT Score) - Ganglionic level infarction (caudate, lentiform nuclei, internal  capsule, insula, M1-M3 cortex): 7 - Supraganglionic infarction (M4-M6 cortex): 3 Total score (0-10 with 10 being normal): 10 IMPRESSION: 1. No evidence of acute intracranial abnormality. 2. ASPECTS is 10. These results were communicated to Dr. Lorraine Lax at 6:24 pm on 07/13/2019 by text page via the [facility] messaging system. Electronically Signed   By: Logan Bores M.D.   On: 07/13/2019 18:26   Laurey Morale, MSN, NP-C [facility] [phone]  07/13/2019, 6:37 PM   Attending physician note to follow with Assessment and plan .   Assessment: Costa WIL SLAPE is an 58 y.o. male  With PMH left side weakness ( d/t spinal cord contusion with residual left side weakness, CVA ( 2012 with no residual deficits), cocaine abuse, STEMI,  CKD, HTn, HLD who presented to [facility] ED as a code stroke for left side weakness. Code stroke was later canceled.  Will obtain MRI to r/o any new stroke.   Stroke Risk Factors - diabetes mellitus, hyperlipidemia and hypertension    Recommendations: --MRI brain    --please page stroke NP  Or  PA  Or MD from 8am -4 pm  as this patient from this time will be  followed by the stroke.   You can look them up on [email or web address]  Password [email or web address]   NEUROHOSPITALIST ADDENDUM Performed a face to face diagnostic evaluation.   I have reviewed the contents of history and physical exam as documented by PA/ARNP/Resident and agree with above documentation.  I have discussed and formulated the above plan as documented. Edits to the note have been made as needed.  58 year old male with past medical history of left-sided weakness secondary to spinal cord contusion, CVA, cocaine abuse, STEMI, CKD, hypertension, hyperlipidemia presents as a code stroke for being unable to get out of his chair, on exam noted to have worsening of his chronic left-sided weakness.  Stat CT head was obtained, showed no acute abnormality.  Patient's deficits were mildly worse compared to previous neurological exam from his prior admissions.  Has increased tone in the left side with contracted left hand, 4 x 5 strength.  Suspect recrudescence of old neurological deficits: Reviewed MRI brain, shows no acute infarct.  Does not need admission for stroke work-up.   Recommend infectious work-up, CBC elevated 13.3.  Recommend UA, urine drug screen given history of cocaine abuse, chest x-ray, EtOH level.     Karena Addison Muna Demers MD [facility] [phone]   If 7pm to 7am, please call on call as listed on AMION.

## 2019-07-13 NOTE — ED Notes (Signed)
Attempted to obtain urine sample from patient x2. Stated unable to go at this time. Informed patient a catheter might be necessary to obtain sample. Patient refused and said he will try again.

## 2019-07-13 NOTE — ED Provider Notes (Signed)
11:41 PM Assumed care from Dr. Maryan Rued, please see their note for full history, physical and decision making until this point. In brief this is a 58 y.o. year old male who presented to the ED tonight with Code Stroke     H/o L sided weakness 2/2 spinal cord contusion and cva along with multiple problems who presents with worsening L sided weakness. Cleared for stroke. Plan to eval for new infectious cause. If ok, will try to ambulate and dispo as appropriate.   Patient not able to ambulate. States muscle pain, ck added on.   Ck  Normal. Doubt myositis or rhabdo. At this point, he doesn't have an apparent medical condition for admission to the hospital but can't be safely discharged from the hospital. Will consult PT/CM/SW per previous provider plans and dispo as appropriate.   Labs, studies and imaging reviewed by myself and considered in medical decision making if ordered. Imaging interpreted by radiology.  Labs Reviewed  CBC - Abnormal; Notable for the following components:      Result Value   WBC 13.3 (*)    All other components within normal limits  DIFFERENTIAL - Abnormal; Notable for the following components:   Neutro Abs 10.3 (*)    Abs Immature Granulocytes 0.08 (*)    All other components within normal limits  COMPREHENSIVE METABOLIC PANEL - Abnormal; Notable for the following components:   CO2 21 (*)    Glucose, Bld 145 (*)    Creatinine, Ser 1.29 (*)    GFR calc non Af Amer 60 (*)    All other components within normal limits  I-STAT CHEM 8, ED - Abnormal; Notable for the following components:   Glucose, Bld 137 (*)    Calcium, Ion 1.09 (*)    All other components within normal limits  CBG MONITORING, ED - Abnormal; Notable for the following components:   Glucose-Capillary 124 (*)    All other components within normal limits  PROTIME-INR  APTT  URINALYSIS, ROUTINE W REFLEX MICROSCOPIC    DG Chest Port 1 View  Final Result    MR BRAIN WO CONTRAST  Final Result     CT HEAD CODE STROKE WO CONTRAST  Final Result      No follow-ups on file.    Breannah Kratt, Corene Cornea, MD 07/14/19 [phone]

## 2019-07-13 NOTE — ED Triage Notes (Signed)
Came in via EMS; pt reported weakness about an hour prior to arrival in ED. C/O left sided weakness + dysarthria. Reported hx of high chol, DM and HTN.

## 2019-07-13 NOTE — ED Provider Notes (Signed)
[facility]ERGENCY DEPARTMENT Provider Note   CSN: [id number] Arrival date & time: 07/13/19  9937     History No chief complaint on file.   [name] is a 58 y.o. male.  Patient is a 58 year old male with a history of chronic left-sided weakness, hypertension, hyperlipidemia, CAD, diabetes, prior polysubstance abuse, CKD who is presenting today as a code stroke.  Patient states approximately 1 hour prior to arrival he was sitting on his couch and when he tried to get up he had no strength in the left side of his body.  EMS noted weakness to the left upper and lower extremity as well as some speech difficulty.  Patient lives alone and he is the only one able to provide history but states his symptoms started approximately 1 hour prior to arrival.  Patient has no prior history of stroke and is currently not taking any anticoagulation.  He denies any recent cough, fever, abdominal pain, nausea or vomiting.  The history is provided by the patient and the EMS personnel.       Past Medical History:  Diagnosis Date  . Alcohol abuse    11-15-2017  per pt last alcohol Dec 2018  . BPH with obstruction/lower urinary tract symptoms   . Chronic arm pain   . Chronic pain    arms, leg, back  . CKD (chronic kidney disease), stage II   . Cocaine abuse ([place])    11-15-2017  per pt last used March 2019  . Congenital hydrocephalus, unspecified (HCC)    slow progression  . Diabetic retinopathy of both eyes ([place])   . Diastolic CHF, chronic ([place]) 07/2017  . Gait instability    multifactorial -- slow worsening congenital hydrocephalus, peripheral neuropathy, left C5-6 cord lesion  . Hematuria   . History of acute pulmonary edema 07/2017  . History of spinal cord injury 01/02/2014   pt fell, caused spinal cord contusion at C5-6--  residual left side weakness    . 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  History of TIA (transient ischemic attack) and stroke    hx cva 12-20-2010 and  TIA 02-22-2011--- no residual's from cva or tia  . Hyperlipidemia   . Hypertension    followed by pcp  . 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 Lesion of bladder   . Peripheral edema    chronic LUE  . Peripheral neuropathy   . Type 2 diabetes mellitus ([place])    followed by dr Karle Plumber--  last A1c 6.8 on 11-06-2017  . Weak urinary stream     Patient Active Problem List   Diagnosis Date Noted  . History of spinal cord injury 04/17/2019  . TIA (transient ischemic attack) 03/05/2018  . Benign essential HTN 03/05/2018  . Sensorineural hearing loss of both ears 12/04/2017  . Mild nonproliferative diabetic retinopathy ([place]) 01/10/2016  . Hypertensive retinopathy of both eyes 01/10/2016  . Spinal stenosis of lumbar region 07/15/2015  . Abnormality of gait 06/07/2015  . Hydrocephalus ([place]) 06/07/2015  . Peripheral neuropathy 04/26/2015  . Constipation 04/26/2015  . Diabetes mellitus type 2 with complications ([place]) 16/96/7893  . Decreased hearing of both ears 03/15/2015  . Tinea pedis 03/15/2015  . Callus of foot 05/22/2014  . Cocaine abuse ([place]) 12/28/2013  . Left-sided weakness 12/26/2013  . Hyperlipidemia associated with type 2 diabetes mellitus ([place]) 04/01/2007  . Class 2 severe  obesity due to excess calories with serious comorbidity and body mass index (BMI) of 36.0 to 36.9 in adult [facility]) 03/28/2007  . Essential hypertension 03/28/2007    Past Surgical History:  Procedure Laterality Date  . CARDIAC CATHETERIZATION  12/01/2008   dr hochrein   abnormal stress myoview:  mild coronary plaque, normal LVF  . CARDIAC CATHETERIZATION  04/29/2011   dr [place]   in setting ECG with new ST elevation & severe hypertensive:  nonobstructive atherosclerotic CAD, normal LVF (30% mRCA)   . CARDIOVASCULAR STRESS TEST  08/15/2010   Low nuclear study w/ no evidence ishemia/  ef  42% with lateral and apical hypokinesis  . CYSTOSCOPY WITH BIOPSY N/A 11/20/2017   Procedure: CYSTOSCOPY WITH BIOPSY AND FULGURATION;  Surgeon: [name], MD;  Location: [facility];  Service: Urology;  Laterality: N/A;  . INCISION AND DRAINAGE RIGHT DISTAL MEDIAL THIGH HEMATOMA  04-14-2005   dr Ninfa Linden  . left arm skin graft  1976   injury  . LEFT HEART CATHETERIZATION WITH CORONARY ANGIOGRAM Bilateral 12/26/2013   Procedure: LEFT HEART CATHETERIZATION WITH CORONARY ANGIOGRAM;  Surgeon: Burnell Blanks, MD;  Location: [facility] CATH LAB;  Service: Cardiovascular;  Laterality: Bilateral;  STEMI, in setting cocaine/ alcohol :  mid disease in the RCA (20%), moderate disease in the small caliber intermediate branch (distal 50-60%), normal LVSF (ef 55-60%)  . TOOTH EXTRACTION N/A 05/16/2018   Procedure: DENTAL RESTORATION/EXTRACTIONS WITH ALVEO;  Surgeon: Diona Browner, DDS;  Location: [facility];  Service: Oral Surgery;  Laterality: N/A;  . TRANSTHORACIC ECHOCARDIOGRAM  08/05/2017   moderate LVH,  ef 55%,  grade 1 diastolic dysfunction/  mild LAE/  trivial Tr       Family History  Problem Relation Age of Onset  . Hypertension Mother   . Diabetes Mother   . Cancer Mother        breast cancer   . Hypertension Father   . Heart disease Father   . Stroke Father   . Hypertension Sister     Social History   Tobacco Use  . Smoking status: Former Smoker    Years: 45.00    Types: Cigarettes    Quit date: 03/03/2018    Years since quitting: 1.3  . Smokeless tobacco: Never Used  . Tobacco comment: 11-15-2017  per pt 1pp3days  Substance Use Topics  . Alcohol use: Not Currently    Alcohol/week: 0.0 standard drinks    Comment: 11-15-2017  per pt last alcohol 12/ 2018  alcohol abuse  . Drug use: Not Currently    Types: "Crack" cocaine, Cocaine    Comment: 11-15-2017  per pt last used March 2019    Home Medications Prior to Admission medications   Medication  Sig Start Date End Date Taking? Authorizing Provider  acetaminophen (TYLENOL) 500 MG tablet TAKE ONE TABLET BY MOUTH TWICE A DAY AS NEEDED 05/19/19   Ladell Pier, MD  aspirin EC 81 MG tablet Take 1 tablet (81 mg total) by mouth daily. 07/03/19   Frann Rider, NP  atorvastatin (LIPITOR) 40 MG tablet Take 1.5 tablets (60 mg total) by mouth daily. 04/20/19   Ladell Pier, MD  betamethasone dipropionate 0.05 % lotion  08/21/18   [provider]  Blood Glucose Monitoring Suppl (TRUE METRIX METER) w/Device KIT Use as directed 01/17/18   Ladell Pier, MD  clindamycin (CLEOCIN) 300 MG capsule Take 1 capsule (300 mg total) by mouth 3 (three) times daily. 07/07/19  Ladell Pier, MD  dapagliflozin propanediol (FARXIGA) 10 MG TABS tablet Take 10 mg by mouth daily before breakfast. 07/07/19   Ladell Pier, MD  DULoxetine (CYMBALTA) 60 MG capsule Take 1 capsule (60 mg total) by mouth daily. For back and leg pains 05/21/19   Charlott Rakes, MD  furosemide (LASIX) 40 MG tablet TAKE ONE TABLET BY MOUTH DAILY . TAKE AN EXTRA TABLET FOR TWO DAYS AS NEEDED FOR INCREASE LOWER EXTREMITY SWELLING 07/03/19   Ladell Pier, MD  gabapentin (NEURONTIN) 100 MG capsule Take 2 capsules (200 mg total) by mouth 2 (two) times daily. 01/02/19   Ladell Pier, MD  glipiZIDE (GLUCOTROL XL) 10 MG 24 hr tablet Take 1 tablet (10 mg total) by mouth daily with breakfast. 04/17/19   Ladell Pier, MD  glucose blood (TRUE METRIX BLOOD GLUCOSE TEST) test strip Check blood sugars 1-2 times daily 04/17/19   Ladell Pier, MD  glucose blood (TRUE METRIX BLOOD GLUCOSE TEST) test strip Test BS twice a day before meals 04/17/19   Ladell Pier, MD  hydrALAZINE (APRESOLINE) 10 MG tablet TAKE ONE TABLET BY MOUTH TWICE A DAY 07/02/19   Ladell Pier, MD  labetalol (NORMODYNE) 100 MG tablet TAKE ONE TABLET BY MOUTH TWICE A DAY 04/17/19   Ladell Pier, MD  Lancets Misc. (ACCU-CHEK  SOFTCLIX LANCET DEV) KIT Use as directed 12/19/17   Ladell Pier, MD  lisinopril (ZESTRIL) 40 MG tablet TAKE ONE TABLET BY MOUTH DAILY 04/17/19   Ladell Pier, MD  loratadine (CLARITIN) 10 MG tablet TAKE ONE TABLET BY MOUTH DAILY AS NEEDED FOR ALLERGIES 05/19/19   Ladell Pier, MD  metFORMIN (GLUCOPHAGE) 1000 MG tablet Take 1 tablet (1,000 mg total) by mouth 2 (two) times daily with a meal. 04/17/19   Ladell Pier, MD  triamcinolone cream (KENALOG) 0.1 % APPLY 1 APPLICATION TOPICALLY TWO (TWO) TIMES DAILY. 08/16/18   Ladell Pier, MD  TRUEPLUS LANCETS 28G MISC Use as directed 01/17/18   Ladell Pier, MD    Allergies    Amlodipine  Review of Systems   Review of Systems  All other systems reviewed and are negative.   Physical Exam Updated Vital Signs BP (!) 163/91 (BP Location: Right Arm)   Pulse 93   Temp 98.4 F (36.9 C) (Oral)   Resp 15   Ht '6\' 3"'$  (1.905 m)   Wt 118.3 kg   SpO2 99%   BMI 32.60 kg/m   Physical Exam Vitals and nursing note reviewed.  Constitutional:      General: He is not in acute distress.    Appearance: Normal appearance. He is well-developed. He is obese.  HENT:     Head: Normocephalic and atraumatic.  Eyes:     Conjunctiva/sclera: Conjunctivae normal.     Pupils: Pupils are equal, round, and reactive to light.  Cardiovascular:     Rate and Rhythm: Normal rate and regular rhythm.     Pulses: Normal pulses.     Heart sounds: No murmur.  Pulmonary:     Effort: Pulmonary effort is normal. No respiratory distress.     Breath sounds: Normal breath sounds. No wheezing or rales.  Abdominal:     General: There is no distension.     Palpations: Abdomen is soft.     Tenderness: There is no abdominal tenderness. There is no guarding or rebound.  Musculoskeletal:        General: No tenderness. Normal  range of motion.     Cervical back: Normal range of motion and neck supple.     Right lower leg: No edema.     Left lower leg:  No edema.  Skin:    General: Skin is warm and dry.     Findings: No erythema or rash.  Neurological:     Mental Status: He is alert and oriented to person, place, and time.     Motor: Weakness present.     Comments: 5 out of 5 strength in the right upper and lower extremity.  3 out of 5 strength in the left upper and lower extremity.  Pronator drift noted in the left lower extremity.  Psychiatric:        Mood and Affect: Mood normal.        Behavior: Behavior normal.        Thought Content: Thought content normal.     ED Results / Procedures / Treatments   Labs (all labs ordered are listed, but only abnormal results are displayed) Labs Reviewed  CBC - Abnormal; Notable for the following components:      Result Value   WBC 13.3 (*)    All other components within normal limits  DIFFERENTIAL - Abnormal; Notable for the following components:   Neutro Abs 10.3 (*)    Abs Immature Granulocytes 0.08 (*)    All other components within normal limits  COMPREHENSIVE METABOLIC PANEL - Abnormal; Notable for the following components:   CO2 21 (*)    Glucose, Bld 145 (*)    Creatinine, Ser 1.29 (*)    GFR calc non Af Amer 60 (*)    All other components within normal limits  I-STAT CHEM 8, ED - Abnormal; Notable for the following components:   Glucose, Bld 137 (*)    Calcium, Ion 1.09 (*)    All other components within normal limits  CBG MONITORING, ED - Abnormal; Notable for the following components:   Glucose-Capillary 124 (*)    All other components within normal limits  PROTIME-INR  APTT    EKG EKG Interpretation  Date/Time:  Sunday July 13 2019 18:33:58 EST Ventricular Rate:  88 PR Interval:    QRS Duration: 113 QT Interval:  378 QTC Calculation: 458 R Axis:   -60 Text Interpretation: Sinus rhythm Left anterior fascicular block Abnormal R-wave progression, late transition Left ventricular hypertrophy ST elevation, consider anterior injury No significant change since  last tracing Confirmed by Blanchie Dessert [phone]) on 07/13/2019 6:50:21 PM   Radiology CT HEAD CODE STROKE WO CONTRAST  Result Date: 07/13/2019 CLINICAL DATA:  Code stroke.  Left hand weakness. EXAM: CT HEAD WITHOUT CONTRAST TECHNIQUE: Contiguous axial images were obtained from the base of the skull through the vertex without intravenous contrast. COMPARISON:  05/09/2019 FINDINGS: Brain: There is no evidence of acute infarct, intracranial hemorrhage, mass, midline shift, or extra-axial fluid collection. The ventricles are unchanged in size with evidence of moderate cerebral atrophy and absence of the septum pellucidum. Vascular: Calcified atherosclerosis at the skull base. No hyperdense vessel. Skull: No fracture or focal osseous lesion. Sinuses/Orbits: Mild ethmoid sinus mucosal. Thickening bilateral concha bullosa. Clear mastoid air cells. Unremarkable orbits. Other: Similar appearance of multiple superficial lesions in the posterior scalp. ASPECTS [facility] Stroke Program Early CT Score) - Ganglionic level infarction (caudate, lentiform nuclei, internal capsule, insula, M1-M3 cortex): 7 - Supraganglionic infarction (M4-M6 cortex): 3 Total score (0-10 with 10 being normal): 10 IMPRESSION: 1. No evidence of acute intracranial abnormality.  2. ASPECTS is 10. These results were communicated to Dr. Lorraine Lax at 6:24 pm on 07/13/2019 by text page via the [facility] messaging system. Electronically Signed   By: Logan Bores M.D.   On: 07/13/2019 18:26    Procedures Procedures (including critical care time)  Medications Ordered in ED Medications  sodium chloride flush (NS) 0.9 % injection 3 mL (has no administration in time range)    ED Course  I have reviewed the triage vital signs and the nursing notes.  Pertinent labs & imaging results that were available during my care of the patient were reviewed by me and considered in my medical decision making (see chart for details).    MDM Rules/Calculators/A&P                       58 year old male with multiple medical problems presenting today as a code stroke with left-sided weakness and speech deficits per EMS.  Patient is hypertensive in the 200s and had a blood sugar of 160.  Patient denies any infectious symptoms and appears to have left-sided weakness on exam.  Looking through the chart patient has had a history of left-sided weakness appears to be related to lumbar radiculopathy related to the left leg.  Patient typically ambulates with a walker and a cane.  Labs and imaging pending.  Stroke team present upon arrival.  7:40 PM Labs are reassuring but does have mild leukocytosis of 13 and CT without acute findings.  Patient has had several stroke work-ups and Dr. Lorraine Lax has seen the patient.  At this time they are requesting an MRI to rule out stroke but feeling this is more likely related to residual known deficits.  Blood pressure is improving in 163/91 here.  Patient is awake alert and in no acute distress at this time.  9:18 PM MRI neg for acute findings.  Discussed with neurology who report pt does not require further stroke work up but recommend UA and CXR to r/o underlying infection. Final Clinical Impression(s) / ED Diagnoses Final diagnoses:  None    Rx / DC Orders ED Discharge Orders    None       Blanchie Dessert, MD 07/13/19 2342

## 2019-07-14 DIAGNOSIS — R531 Weakness: Secondary | ICD-10-CM | POA: Diagnosis not present

## 2019-07-14 LAB — URINALYSIS, ROUTINE W REFLEX MICROSCOPIC
Bacteria, UA: NONE SEEN
Bilirubin Urine: NEGATIVE
Glucose, UA: >=500 mg/dL — AB
Hgb urine dipstick: NEGATIVE
Ketones, ur: 5 mg/dL — AB
Leukocytes,Ua: NEGATIVE
Nitrite: NEGATIVE
Protein, ur: 100 mg/dL — AB
Specific Gravity, Urine: 1.027 (ref 1.005–1.030)
pH: 5 (ref 5.0–8.0)

## 2019-07-14 LAB — SARS CORONAVIRUS 2 (TAT 6-24 HRS): SARS Coronavirus 2: NEGATIVE

## 2019-07-14 LAB — CK: Total CK: 94 U/L (ref 49–397)

## 2019-07-14 MED ORDER — CLINDAMYCIN HCL 150 MG PO CAPS
300.0000 mg | ORAL_CAPSULE | Freq: Three times a day (TID) | ORAL | Status: DC
  Administered 2019-07-14 – 2019-07-15 (×4): 300 mg via ORAL
  Filled 2019-07-14 (×4): qty 2

## 2019-07-14 MED ORDER — LISINOPRIL 20 MG PO TABS
40.0000 mg | ORAL_TABLET | Freq: Every day | ORAL | Status: DC
  Administered 2019-07-14 – 2019-07-15 (×2): 40 mg via ORAL
  Filled 2019-07-14 (×2): qty 2

## 2019-07-14 MED ORDER — METFORMIN HCL 500 MG PO TABS
1000.0000 mg | ORAL_TABLET | Freq: Two times a day (BID) | ORAL | Status: DC
  Administered 2019-07-14 – 2019-07-15 (×3): 1000 mg via ORAL
  Filled 2019-07-14 (×4): qty 2

## 2019-07-14 MED ORDER — HYDRALAZINE HCL 10 MG PO TABS
10.0000 mg | ORAL_TABLET | Freq: Every day | ORAL | Status: DC
  Administered 2019-07-14 – 2019-07-15 (×2): 10 mg via ORAL
  Filled 2019-07-14 (×2): qty 1

## 2019-07-14 MED ORDER — DULOXETINE HCL 60 MG PO CPEP
60.0000 mg | ORAL_CAPSULE | Freq: Every day | ORAL | Status: DC
  Administered 2019-07-14 – 2019-07-15 (×2): 60 mg via ORAL
  Filled 2019-07-14 (×2): qty 1

## 2019-07-14 MED ORDER — GABAPENTIN 100 MG PO CAPS
200.0000 mg | ORAL_CAPSULE | Freq: Two times a day (BID) | ORAL | Status: DC
  Administered 2019-07-14 – 2019-07-15 (×3): 200 mg via ORAL
  Filled 2019-07-14 (×3): qty 2

## 2019-07-14 MED ORDER — LABETALOL HCL 200 MG PO TABS
100.0000 mg | ORAL_TABLET | Freq: Two times a day (BID) | ORAL | Status: DC
  Administered 2019-07-14 – 2019-07-15 (×3): 100 mg via ORAL
  Filled 2019-07-14 (×3): qty 1

## 2019-07-14 MED ORDER — GLIPIZIDE ER 10 MG PO TB24
10.0000 mg | ORAL_TABLET | Freq: Every day | ORAL | Status: DC
  Administered 2019-07-14 – 2019-07-15 (×2): 10 mg via ORAL
  Filled 2019-07-14 (×3): qty 1

## 2019-07-14 MED ORDER — CANAGLIFLOZIN 100 MG PO TABS
100.0000 mg | ORAL_TABLET | Freq: Every day | ORAL | Status: DC
  Administered 2019-07-14: 100 mg via ORAL
  Filled 2019-07-14 (×2): qty 1

## 2019-07-14 MED ORDER — FUROSEMIDE 20 MG PO TABS
40.0000 mg | ORAL_TABLET | Freq: Every day | ORAL | Status: DC
  Administered 2019-07-14 – 2019-07-15 (×2): 40 mg via ORAL
  Filled 2019-07-14 (×2): qty 2

## 2019-07-14 NOTE — NC FL2 (Signed)
[place] LEVEL OF CARE SCREENING TOOL     IDENTIFICATION  Patient Name: [name] Birthdate: February 12, 1961 Sex: male Admission Date (Current Location): 07/13/2019  [facility] and [place] Number:      Facility and Address:  [facility], [place] [address] [place], [place] [address]      Provider Number:    Attending Physician Name and Address:  Default, Provider, MD  Relative Name and Phone Number:       Current Level of Care: Hospital Recommended Level of Care: [place] Prior Approval Number:    Date Approved/Denied:   PASRR Number: [id number]  Discharge Plan: SNF    Current Diagnoses: Patient Active Problem List   Diagnosis Date Noted  . History of spinal cord injury 04/17/2019  . TIA (transient ischemic attack) 03/05/2018  . Benign essential HTN 03/05/2018  . Sensorineural hearing loss of both ears 12/04/2017  . Mild nonproliferative diabetic retinopathy ([place]) 01/10/2016  . Hypertensive retinopathy of both eyes 01/10/2016  . Spinal stenosis of lumbar region 07/15/2015  . Abnormality of gait 06/07/2015  . Hydrocephalus ([place]) 06/07/2015  . Peripheral neuropathy 04/26/2015  . Constipation 04/26/2015  . Diabetes mellitus type 2 with complications ([place]) 07/62/2633  . Decreased hearing of both ears 03/15/2015  . Tinea pedis 03/15/2015  . Callus of foot 05/22/2014  . Cocaine abuse ([place]) 12/28/2013  . Left-sided weakness 12/26/2013  . Hyperlipidemia associated with type 2 diabetes mellitus ([place]) 04/01/2007  . Class 2 severe obesity due to excess calories with serious comorbidity and body mass index (BMI) of 36.0 to 36.9 in adult ([place]) 03/28/2007  . Essential hypertension 03/28/2007    Orientation RESPIRATION BLADDER Height & Weight     Self, Time, Situation, Place  Normal Continent Weight: 260 lb 12.9 oz (118.3 kg) Height:  6' 3" (190.5 cm)  BEHAVIORAL SYMPTOMS/MOOD NEUROLOGICAL BOWEL NUTRITION STATUS   Continent Diet  AMBULATORY STATUS COMMUNICATION OF NEEDS Skin   Limited Assist Verbally Normal                       Personal Care Assistance Level of Assistance  Bathing, Total care, Feeding, Dressing Bathing Assistance: Limited assistance Feeding assistance: Limited assistance Dressing Assistance: Maximum assistance Total Care Assistance: Maximum assistance   Functional Limitations Info  Speech, Hearing, Sight Sight Info: Adequate Hearing Info: Adequate Speech Info: Adequate    SPECIAL CARE FACTORS FREQUENCY  PT (By licensed PT), OT (By licensed OT)     PT Frequency: 5x weekly OT Frequency: 5x weekly            Contractures Contractures Info: Not present    Additional Factors Info  Allergies   Allergies Info: Amlodipine           Current Medications (07/14/2019):  This is the current hospital active medication list Current Facility-Administered Medications  Medication Dose Route Frequency Provider Last Rate Last Admin  . canagliflozin (INVOKANA) tablet 100 mg  100 mg Oral QAC breakfast Mesner, Corene Cornea, MD   100 mg at 07/14/19 0728  . clindamycin (CLEOCIN) capsule 300 mg  300 mg Oral TID Mesner, Corene Cornea, MD   300 mg at 07/14/19 1038  . DULoxetine (CYMBALTA) DR capsule 60 mg  60 mg Oral Daily Mesner, Jason, MD   60 mg at 07/14/19 1038  . furosemide (LASIX) tablet 40 mg  40 mg Oral Daily Mesner, Jason, MD   40 mg at 07/14/19 1039  . gabapentin (NEURONTIN) capsule 200  mg  200 mg Oral BID Mesner, Corene Cornea, MD   200 mg at 07/14/19 1049  . glipiZIDE (GLUCOTROL XL) 24 hr tablet 10 mg  10 mg Oral Q breakfast Mesner, Corene Cornea, MD   10 mg at 07/14/19 9767  . hydrALAZINE (APRESOLINE) tablet 10 mg  10 mg Oral Daily Mesner, Corene Cornea, MD   10 mg at 07/14/19 1038  . labetalol (NORMODYNE) tablet 100 mg  100 mg Oral BID Mesner, Corene Cornea, MD   100 mg at 07/14/19 1049  . lisinopril (ZESTRIL) tablet 40 mg  40 mg Oral Daily Mesner, Jason, MD   40 mg at 07/14/19 1038  . metFORMIN (GLUCOPHAGE)  tablet 1,000 mg  1,000 mg Oral BID WC Mesner, Corene Cornea, MD   1,000 mg at 07/14/19 0728  . sodium chloride flush (NS) 0.9 % injection 3 mL  3 mL Intravenous Once Blanchie Dessert, MD       Current Outpatient Medications  Medication Sig Dispense Refill  . acetaminophen (TYLENOL) 500 MG tablet TAKE ONE TABLET BY MOUTH TWICE A DAY AS NEEDED (Patient taking differently: Take 500 mg by mouth 2 (two) times daily as needed. ) 60 tablet 0  . aspirin EC 81 MG tablet Take 1 tablet (81 mg total) by mouth daily. 30 tablet 4  . atorvastatin (LIPITOR) 40 MG tablet Take 1.5 tablets (60 mg total) by mouth daily. 45 tablet 6  . Blood Glucose Monitoring Suppl (TRUE METRIX METER) w/Device KIT Use as directed 1 kit 0  . clindamycin (CLEOCIN) 300 MG capsule Take 1 capsule (300 mg total) by mouth 3 (three) times daily. 21 capsule 0  . dapagliflozin propanediol (FARXIGA) 10 MG TABS tablet Take 10 mg by mouth daily before breakfast. 30 tablet 6  . DULoxetine (CYMBALTA) 60 MG capsule Take 1 capsule (60 mg total) by mouth daily. For back and leg pains 30 capsule 3  . furosemide (LASIX) 40 MG tablet TAKE ONE TABLET BY MOUTH DAILY . TAKE AN EXTRA TABLET FOR TWO DAYS AS NEEDED FOR INCREASE LOWER EXTREMITY SWELLING (Patient taking differently: Take 40 mg by mouth daily. ) 40 tablet 2  . gabapentin (NEURONTIN) 100 MG capsule Take 2 capsules (200 mg total) by mouth 2 (two) times daily. 120 capsule 6  . glipiZIDE (GLUCOTROL XL) 10 MG 24 hr tablet Take 1 tablet (10 mg total) by mouth daily with breakfast. 30 tablet 6  . glucose blood (TRUE METRIX BLOOD GLUCOSE TEST) test strip Check blood sugars 1-2 times daily 100 each 12  . glucose blood (TRUE METRIX BLOOD GLUCOSE TEST) test strip Test BS twice a day before meals 100 each 12  . hydrALAZINE (APRESOLINE) 10 MG tablet TAKE ONE TABLET BY MOUTH TWICE A DAY (Patient taking differently: Take 10 mg by mouth daily. ) 60 tablet 1  . labetalol (NORMODYNE) 100 MG tablet TAKE ONE TABLET BY MOUTH  TWICE A DAY (Patient taking differently: Take 100 mg by mouth 2 (two) times daily. ) 180 tablet 0  . Lancets Misc. (ACCU-CHEK SOFTCLIX LANCET DEV) KIT Use as directed 1 kit 0  . lisinopril (ZESTRIL) 40 MG tablet TAKE ONE TABLET BY MOUTH DAILY (Patient taking differently: Take 40 mg by mouth daily. ) 90 tablet 0  . loratadine (CLARITIN) 10 MG tablet TAKE ONE TABLET BY MOUTH DAILY AS NEEDED FOR ALLERGIES (Patient taking differently: Take 10 mg by mouth daily as needed for allergies. ) 30 tablet 2  . metFORMIN (GLUCOPHAGE) 1000 MG tablet Take 1 tablet (1,000 mg total) by mouth 2 (  two) times daily with a meal. 180 tablet 3  . TRUEPLUS LANCETS 28G MISC Use as directed 100 each 6     Discharge Medications: Please see discharge summary for a list of discharge medications.  Relevant Imaging Results:  Relevant Lab Results:   Additional Information SSN: [id number]  Archie Endo, LCSW

## 2019-07-14 NOTE — ED Notes (Signed)
Patient c/o of "leg jumping" stated its never done that before. Pt denies any recent fall or trauma. Informed patient of negative CT / MRI, and reinforced idea of walking.

## 2019-07-14 NOTE — ED Notes (Signed)
Ordered diet tray 

## 2019-07-14 NOTE — ED Provider Notes (Signed)
Patient care assumed at 0700. Patient here for evaluation of acute on chronic weakness, code stroke canceled. Patient is unable to ambulate without assistance. No evidence of acute bacterial infection. He currently lives alone and does not have assistance at home. He has been evaluated by PT, and skilled nursing placement is recommended. Will obtain screening COVID swab and request case management and social work assistance in patient placement.   Quintella Reichert, MD 07/14/19 [phone]

## 2019-07-14 NOTE — Progress Notes (Signed)
CSW spoke with [facility], RN regarding patient's recommendation for SNF. Patient expressed interest in SNF at discharge and informed PT that he prefers [facility]. RN to explain to patient that CSW will work him up for SNF and will present him with offers once they are available.  CSW completed SNF fax out for [facility], currently awaiting on bed offers.  Madilyn Fireman, MSW, LCSW-A Transitions of Care  Clinical Social Worker  [facility] Emergency Departments  Medical ICU [phone]

## 2019-07-14 NOTE — ED Notes (Signed)
Patient assisted to bedside commode w/ assist of 2 techs. Pt has unsteady gait and shuffled. Pt c/o upon standing as well, stated both thighs / leg  hurt.

## 2019-07-14 NOTE — ED Notes (Signed)
Pt's leads removed. This RN applied more stickers/leads to ensure proper cardiac monitoring. Will continue to monitor

## 2019-07-14 NOTE — Evaluation (Signed)
Physical Therapy Evaluation Patient Details Name: [name] MRN: [id number] DOB: 06-Dec-1960 Today's Date: 07/14/2019   History of Present Illness  [name] is an 58 y.o. male  With PMH left side weakness ( d/t spinal cord contusion with residual left side weakness, CVA ( 2012 with no residual deficits), cocaine abuse, STEMI, CKD, HTn, HLD who presented to [facility] ED as a code stroke for left side weakness. Code stroke was later canceled.  MRI was negative for acute change.  Clinical Impression  Patient presents with decreased mobility due to pain, decreased strength and balance with one episode of LOB during ambulation with RW.  Increased time for all mobility with pain L UE/LE and reports symptoms persist with increased numbness as well.  Feel he is not safe for home alone and reports unable to increase aide assist or have family in more often.  Recommend STSNF level rehab at d/c.  Patient interested in pursuing [facility].  PT will follow acutely.     Follow Up Recommendations SNF    Equipment Recommendations  None recommended by PT    Recommendations for Other Services       Precautions / Restrictions Precautions Precautions: Fall Precaution Comments: reports fell about a month ago when legs gave away in bathroom, got up with EMS      Mobility  Bed Mobility Overal bed mobility: Needs Assistance Bed Mobility: Supine to Sit;Sit to Supine     Supine to sit: HOB elevated;Mod assist Sit to supine: Min assist   General bed mobility comments: heavy lifting assist for trunk up from stretcher with elevated HOB; pt managed legs unaided; to supine assist for positioning  Transfers Overall transfer level: Needs assistance Equipment used: Rolling walker (2 wheeled) Transfers: Sit to/from Stand Sit to Stand: Supervision;Min guard         General transfer comment: up from elevated height of stretcher with increased time, two attempts and close S; from armchair with minguard  for safety heavy reliance on UE support  Ambulation/Gait Ambulation/Gait assistance: Min assist Gait Distance (Feet): 90 Feet Assistive device: Rolling walker (2 wheeled) Gait Pattern/deviations: Step-to pattern;Step-through pattern;Staggering left;Decreased stride length;Decreased stance time - right;Decreased weight shift to right     General Gait Details: slow pace, reports with more pain L LE with weight bearing, but listing to L and one LOB to L with min A to recover, otherwise close S to minguard for ambulation in hallway  Stairs            Wheelchair Mobility    Modified Rankin (Stroke Patients Only) Modified Rankin (Stroke Patients Only) Pre-Morbid Rankin Score: Slight disability Modified Rankin: Moderately severe disability     Balance Overall balance assessment: Needs assistance Sitting-balance support: Feet supported;No upper extremity supported Sitting balance-Leahy Scale: Good     Standing balance support: Bilateral upper extremity supported Standing balance-Leahy Scale: Poor Standing balance comment: UE support for balance; did use urinal standing, but had backs of  legs braced against chair                             Pertinent Vitals/Pain Pain Assessment: Faces Faces Pain Scale: Hurts whole lot Pain Location: L LE with ambulation Pain Descriptors / Indicators: Aching;Tightness;Tingling;Numbness Pain Intervention(s): Monitored during session;Repositioned    Home Living Family/patient expects to be discharged to:: Private residence Living Arrangements: Alone Available Help at Discharge: Family;Personal care attendant Type of Home: Apartment Home Access: Level entry  Home Layout: One level Home Equipment: [place] - 4 wheels;Cane - single point;Bedside commode Additional Comments: aide comes 3 days a week for 2 hours    Prior Function Level of Independence: Independent with assistive device(s)         Comments: rollator for  ambulation; can bathe on his own and dress, aide helps with housework, cooking, brother and sister help some too and live local     Hand Dominance   Dominant Hand: Right    Extremity/Trunk Assessment   Upper Extremity Assessment Upper Extremity Assessment: LUE deficits/detail LUE Deficits / Details: limited AROM and strength L UE due to [place] contusion years ago; but reports feels more numb with painful tinging and more weakness than usual; strength elbow flexion 3+/5 extension 3-/5, shoulder elevation 2/5 LUE Coordination: decreased gross motor;decreased fine motor    Lower Extremity Assessment Lower Extremity Assessment: LLE deficits/detail LLE Deficits / Details: AROM WFL, but slowed with some stiffness and increased time to move, strength hip flexion 2+/5, knee extension 3+/5, ankle DF 3+/5 LLE Sensation: decreased light touch(numb at foot and decreased to light touch with tingling entire leg) LLE Coordination: decreased gross motor;decreased fine motor    Cervical / Trunk Assessment Cervical / Trunk Assessment: Kyphotic  Communication   Communication: No difficulties  Cognition Arousal/Alertness: Awake/alert Behavior During Therapy: WFL for tasks assessed/performed Overall Cognitive Status: Within Functional Limits for tasks assessed                                        General Comments General comments (skin integrity, edema, etc.): VSS with ambulation; reports pain in back and hips with urination in standing    Exercises     Assessment/Plan    PT Assessment Patient needs continued PT services  PT Problem List Decreased mobility;Decreased activity tolerance;Pain;Decreased balance;Impaired sensation       PT Treatment Interventions DME instruction;Therapeutic activities;Balance training;Patient/family education;Therapeutic exercise;Functional mobility training;Gait training    PT Goals (Current goals can be found in the Care Plan section)  Acute  Rehab PT Goals Patient Stated Goal: agreeable to rehab PT Goal Formulation: With patient Time For Goal Achievement: 07/28/19 Potential to Achieve Goals: Good    Frequency Min 3X/week   Barriers to discharge        Co-evaluation               AM-PAC PT "6 Clicks" Mobility  Outcome Measure Help needed turning from your back to your side while in a flat bed without using bedrails?: None Help needed moving from lying on your back to sitting on the side of a flat bed without using bedrails?: A Lot Help needed moving to and from a bed to a chair (including a wheelchair)?: A Little Help needed standing up from a chair using your arms (e.g., wheelchair or bedside chair)?: A Little Help needed to walk in hospital room?: A Little Help needed climbing 3-5 steps with a railing? : A Little 6 Click Score: 18    End of Session   Activity Tolerance: Patient limited by pain Patient left: in bed;with call bell/phone within reach Nurse Communication: Mobility status PT Visit Diagnosis: Other abnormalities of gait and mobility (R26.89);Difficulty in walking, not elsewhere classified (R26.2);Pain Pain - Right/Left: Left Pain - part of body: Leg    Time: 0920-0950 PT Time Calculation (min) (ACUTE ONLY): 30 min   Charges:   PT Evaluation $  PT Eval Moderate Complexity: 1 Mod PT Treatments $Gait Training: 8-22 mins        [name], [name] Acute Rehabilitation Services [phone] 07/14/2019   Reginia Naas 07/14/2019, 11:07 AM

## 2019-07-14 NOTE — Discharge Planning (Signed)
EDCM to follow for disposition needs.  

## 2019-07-14 NOTE — ED Notes (Signed)
This RN reinforced pt's need to walk in the hall in order to be discharged. Pt expressed feeling like leg was "acting up," but stated he understood necessity of ambulation.

## 2019-07-14 NOTE — ED Notes (Signed)
PT working with pt at this time

## 2019-07-14 NOTE — Discharge Planning (Signed)
Clinical Social Work is seeking post-discharge placement for this patient at the following level of care: SNF.    

## 2019-07-15 ENCOUNTER — Other Ambulatory Visit: Payer: Self-pay

## 2019-07-15 DIAGNOSIS — R531 Weakness: Secondary | ICD-10-CM | POA: Diagnosis not present

## 2019-07-15 NOTE — ED Notes (Signed)
[facility] has arrived to transport pt - Pt has all of his belongings.

## 2019-07-15 NOTE — Discharge Instructions (Signed)
Follow up with your family doc when able to discuss your visit here.

## 2019-07-15 NOTE — ED Notes (Signed)
Breakfast Ordered 

## 2019-07-15 NOTE — Discharge Planning (Signed)
EDCM met with pt at bedside regarding selecting [facility] from his list of offers.  Pt chose [facility].

## 2019-07-15 NOTE — Progress Notes (Signed)
Patient will go to room 107A at [facility]. Patient will be transported via [facility] at 1pm. The number to call for report is [phone].   Jacqlyn Larsen, RN and Dr. Tyrone Nine aware of patient's discharge plan.  Madilyn Fireman, MSW, LCSW-A Transitions of Care  Clinical Social Worker  [facility] Emergency Departments  Medical ICU [phone]

## 2019-07-16 DIAGNOSIS — L03115 Cellulitis of right lower limb: Secondary | ICD-10-CM | POA: Diagnosis not present

## 2019-07-16 DIAGNOSIS — R2689 Other abnormalities of gait and mobility: Secondary | ICD-10-CM | POA: Diagnosis not present

## 2019-07-16 DIAGNOSIS — L03116 Cellulitis of left lower limb: Secondary | ICD-10-CM | POA: Diagnosis not present

## 2019-07-16 DIAGNOSIS — R531 Weakness: Secondary | ICD-10-CM | POA: Diagnosis not present

## 2019-07-16 DIAGNOSIS — I10 Essential (primary) hypertension: Secondary | ICD-10-CM | POA: Diagnosis not present

## 2019-07-16 DIAGNOSIS — E1169 Type 2 diabetes mellitus with other specified complication: Secondary | ICD-10-CM | POA: Diagnosis not present

## 2019-07-21 NOTE — Therapy (Signed)
[place], [place], [address] Phone: [phone]   Fax:  [phone]  Physical Therapy Evaluation/Discharge  Patient Details  Name: [name] MRN: [id number] Date of Birth: [birth year] Referring Provider (PT): Charlott Rakes, MD   Encounter Date: 06/11/2019    Past Medical History:  Diagnosis Date  . Alcohol abuse    11-15-2017  per pt last alcohol Dec 2018  . BPH with obstruction/lower urinary tract symptoms   . Chronic arm pain   . Chronic pain    arms, leg, back  . CKD (chronic kidney disease), stage II   . Cocaine abuse ([place])    11-15-2017  per pt last used March 2019  . Congenital hydrocephalus, unspecified (HCC)    slow progression  . Diabetic retinopathy of both eyes ([place])   . Diastolic CHF, chronic ([place]) 07/2017  . Gait instability    multifactorial -- slow worsening congenital hydrocephalus, peripheral neuropathy, left C5-6 cord lesion  . Hematuria   . History of acute pulmonary edema 07/2017  . History of spinal cord injury 01/02/2014   pt fell, caused spinal cord contusion at C5-6--  residual left side weakness    . 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 History of TIA (transient ischemic attack) and stroke    hx cva 12-20-2010 and  TIA 02-22-2011--- no residual's from cva or tia  . Hyperlipidemia   . Hypertension    followed by pcp  . 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 Lesion of bladder   . Peripheral edema    chronic LUE  . Peripheral neuropathy   . Type 2 diabetes mellitus ([place])    followed by dr Karle Plumber--  last A1c 6.8 on 11-06-2017  . Weak urinary stream     Past Surgical History:  Procedure Laterality Date  . CARDIAC CATHETERIZATION  12/01/2008   dr hochrein   abnormal stress myoview:  mild coronary plaque, normal LVF  . CARDIAC CATHETERIZATION   04/29/2011   dr Martinique   in setting ECG with new ST elevation & severe hypertensive:  nonobstructive atherosclerotic CAD, normal LVF (30% mRCA)   . CARDIOVASCULAR STRESS TEST  08/15/2010   Low nuclear study w/ no evidence ishemia/  ef 42% with lateral and apical hypokinesis  . CYSTOSCOPY WITH BIOPSY N/A 11/20/2017   Procedure: CYSTOSCOPY WITH BIOPSY AND FULGURATION;  Surgeon: [name], MD;  Location: [facility];  Service: Urology;  Laterality: N/A;  . INCISION AND DRAINAGE RIGHT DISTAL MEDIAL THIGH HEMATOMA  04-14-2005   dr Ninfa Linden  . left arm skin graft  1976   injury  . LEFT HEART CATHETERIZATION WITH CORONARY ANGIOGRAM Bilateral 12/26/2013   Procedure: LEFT HEART CATHETERIZATION WITH CORONARY ANGIOGRAM;  Surgeon: Burnell Blanks, MD;  Location: [facility] CATH LAB;  Service: Cardiovascular;  Laterality: Bilateral;  STEMI, in setting cocaine/ alcohol :  mid disease in the RCA (20%), moderate disease in the small caliber intermediate branch (distal 50-60%), normal LVSF (ef 55-60%)  . TOOTH EXTRACTION N/A 05/16/2018   Procedure: DENTAL RESTORATION/EXTRACTIONS WITH ALVEO;  Surgeon: Diona Browner, DDS;  Location: [facility];  Service: Oral Surgery;  Laterality: N/A;  . TRANSTHORACIC ECHOCARDIOGRAM  08/05/2017   moderate LVH,  ef 55%,  grade 1 diastolic dysfunction/  mild LAE/  trivial Tr    There were no vitals filed for this visit.  Objective measurements completed on examination: See above findings.                PT Short Term Goals - 06/11/19 1308      PT SHORT TERM GOAL #1   Title  Independent with HEP    Baseline  needs HEP    Time  3    Period  Weeks    Status  New      PT SHORT TERM GOAL #2   Title  Demonstrate bilat. step-through gait with rollator for progression with LTG goal for [place]/decreased fall risk    Baseline  decreased step length bilat. with rollator, unable step-through gait pattern     Time  3    Period  Weeks    Status  New      PT SHORT TERM GOAL #3   Title  Perform bed mobility for sit<>supine independently to modified independently for improved ease with bed mobility at home    Baseline  requires min assist in clinic    Time  3    Period  Weeks    Status  New        PT Long Term Goals - 06/11/19 1309      PT LONG TERM GOAL #1   Title  Improve FOTO score to 42% or less impairment due to back    Baseline  60% limited    Time  6    Period  Weeks    Status  New    Target Date  07/13/19      PT LONG TERM GOAL #2   Title  Improve Tinetti Gait + Balance score to at least >/= 19/28 for decreased fall risk    Baseline  15/28    Time  6    Period  Weeks    Status  New    Target Date  07/23/19      PT LONG TERM GOAL #3   Title  Ambulate 80 feet or greater mod I with NBQC to work toward ability to use cane for ambulation in home between rooms and to use restroom    Baseline  uses rollator    Time  6    Period  Weeks    Status  New    Target Date  07/23/19      PT LONG TERM GOAL #4   Title  Increase bilat. LE strength grossly 1/2 MMT grade to improve ability for transfers from low seats    Baseline  see objective    Time  6    Period  Weeks    Status  New    Target Date  07/23/19      PT LONG TERM GOAL #5   Title  Tolerate standing for doing dishes, meal preparation at home for periods at least 10-15 minutes with LBP/leg pain 5/10 or less    Baseline  reports limited to a "couple of minutes", pain 7/10    Time  6    Period  Weeks    Status  New    Target Date  07/23/19               Patient will benefit from skilled therapeutic intervention in order to improve the following deficits and impairments:  Abnormal gait, Decreased balance, Decreased endurance, Difficulty walking, Decreased range of motion, Decreased activity tolerance, Decreased strength, Pain  Visit Diagnosis: Lumbar radiculopathy - Plan: PT plan of care cert/re-cert  Muscle  weakness (generalized) -  Plan: PT plan of care cert/re-cert  Difficulty in walking, not elsewhere classified - Plan: PT plan of care cert/re-cert     Problem List Patient Active Problem List   Diagnosis Date Noted  . History of spinal cord injury 04/17/2019  . TIA (transient ischemic attack) 03/05/2018  . Benign essential HTN 03/05/2018  . Sensorineural hearing loss of both ears 12/04/2017  . Mild nonproliferative diabetic retinopathy ([place]) 01/10/2016  . Hypertensive retinopathy of both eyes 01/10/2016  . Spinal stenosis of lumbar region 07/15/2015  . Abnormality of gait 06/07/2015  . Hydrocephalus ([place]) 06/07/2015  . Peripheral neuropathy 04/26/2015  . Constipation 04/26/2015  . Diabetes mellitus type 2 with complications ([place]) 44/45/8483  . Decreased hearing of both ears 03/15/2015  . Tinea pedis 03/15/2015  . Callus of foot 05/22/2014  . Cocaine abuse ([place]) 12/28/2013  . Left-sided weakness 12/26/2013  . Hyperlipidemia associated with type 2 diabetes mellitus ([place]) 04/01/2007  . Class 2 severe obesity due to excess calories with serious comorbidity and body mass index (BMI) of 36.0 to 36.9 in adult ([place]) 03/28/2007  . Essential hypertension 03/28/2007         PHYSICAL THERAPY DISCHARGE SUMMARY  Visits from Start of Care: 1  Current functional level related to goals / functional outcomes: Patient did not return for further therapy after initial evaluation visit   Remaining deficits: LBP with LE weakness and gait difficulties   Education / Equipment: HEP instruction at eval Plan: Patient agrees to discharge.  Patient goals were not met. Patient is being discharged due to not returning since the last visit.  ?????       Beaulah Dinning, PT, DPT 07/21/19 10:31 AM           [facility] [address] [place], [place], [address] Phone: [phone]   Fax:  [phone]  Name: [name] MRN:  [id number] Date of Birth: [birth year]

## 2019-07-29 ENCOUNTER — Other Ambulatory Visit: Payer: Self-pay | Admitting: Internal Medicine

## 2019-07-29 DIAGNOSIS — E1142 Type 2 diabetes mellitus with diabetic polyneuropathy: Secondary | ICD-10-CM

## 2019-07-29 DIAGNOSIS — IMO0002 Reserved for concepts with insufficient information to code with codable children: Secondary | ICD-10-CM

## 2019-07-29 DIAGNOSIS — I10 Essential (primary) hypertension: Secondary | ICD-10-CM

## 2019-07-30 ENCOUNTER — Other Ambulatory Visit: Payer: Self-pay | Admitting: *Deleted

## 2019-07-30 NOTE — Patient Outreach (Signed)
Member screened for potential [facility] Care Management needs as a benefit of [place] Medicare.  [name] is currently receiving skilled therapy at [facility].  Telephone call made to [name] on listed cell phone in system. No answer. Left HIPAA compliant voicemail message requesting call back.   Will continue to follow for potential [facility] Care Management needs and disposition plans.   Will plan outreach again at later time.  Marthenia Rolling, MSN-Ed, RN,BSN [place] Acute Care Coordinator [phone] [facility]) [phone]  (Toll free office)

## 2019-08-01 ENCOUNTER — Other Ambulatory Visit: Payer: Self-pay | Admitting: *Deleted

## 2019-08-01 NOTE — Patient Outreach (Addendum)
Member screened for potential [facility] Care Management needs as a benefit of [place] Medicare.  Mr. Ewert is currently receiving skilled therapy at [facility].  Facility dc planner reports member will transition home home early next week, per his request. He will likely have [place] arranged per facility dc planner.   Telephone call attempt made to Mr. Talati' cell on file [phone]. No answer. Telephone call made to sister to confirm correct number. Telephone call to facility to request that someone let Mr. Dorgan know that writer is attempting to call him.  Spoke with Mr. Galin at [phone]. Patient identifiers confirmed. Mr. Salopek is aware that his voicemail is in [place]. States it switched on its on and he has contacted the phone company about it.  Mr. Michalk endorses that he will return home on Monday, Jan. 11th. He is agreeable to [place] Management referral for [facility] RNCM for care coordination and complex case management. Also [facility] Social Worker for meal assistance. Mr. Irias reports he uses [facility] for transportation. Encouraged Mr. Fioravanti to make follow up appointment with his PCP Dr. Wynetta Emery shortly after SNF discharge. Mr. Herin is agreeable to this.   States he does not have any personal care services since the beginning of the pandemic. States his cousin Enid Derry is his primary support person.   Mr. Bizon has a medical history DM, Hypertension, Type 2 DM (A1c 9.3), CAD,CVA with L hemiparesis.  Will plan to make referral to [place] and [place] worker upon SNF transition.    Marthenia Rolling, MSN-Ed, RN,BSN [place] Acute Care Coordinator [phone] [facility]) [phone]  (Toll free office)

## 2019-08-04 ENCOUNTER — Other Ambulatory Visit: Payer: Self-pay | Admitting: *Deleted

## 2019-08-04 DIAGNOSIS — I10 Essential (primary) hypertension: Secondary | ICD-10-CM

## 2019-08-04 NOTE — Patient Outreach (Signed)
Member screened for potential [facility] Care Management needs as a benefit of [place] Medicare.  Mr. Bone is slated for transition to home from [facility] SNF today 08/04/2019. Writer spoke with Mr. Schoof on 08/01/19 to discuss [place] Management services. He was agreeable.   Mr. Turlington has a medical history of DM, CVA, HTN, HLD, CHF.  Will make appropriate referrals for [facility] for complex case management and [facility] Social Worker for meal assistance.   Confirmed with Mr. Doerflein that his correct mobile number is indeed [phone].   Marthenia Rolling, MSN-Ed, RN,BSN [place] Acute Care Coordinator [phone] [facility]) [phone]  (Toll free office)

## 2019-08-05 ENCOUNTER — Encounter: Payer: Self-pay | Admitting: *Deleted

## 2019-08-05 ENCOUNTER — Other Ambulatory Visit: Payer: Self-pay

## 2019-08-05 ENCOUNTER — Telehealth: Payer: Self-pay | Admitting: Internal Medicine

## 2019-08-05 ENCOUNTER — Other Ambulatory Visit: Payer: Self-pay | Admitting: *Deleted

## 2019-08-05 DIAGNOSIS — Z9181 History of falling: Secondary | ICD-10-CM | POA: Diagnosis not present

## 2019-08-05 DIAGNOSIS — Z6833 Body mass index (BMI) 33.0-33.9, adult: Secondary | ICD-10-CM | POA: Diagnosis not present

## 2019-08-05 DIAGNOSIS — G629 Polyneuropathy, unspecified: Secondary | ICD-10-CM | POA: Diagnosis not present

## 2019-08-05 DIAGNOSIS — E669 Obesity, unspecified: Secondary | ICD-10-CM | POA: Diagnosis not present

## 2019-08-05 DIAGNOSIS — G8194 Hemiplegia, unspecified affecting left nondominant side: Secondary | ICD-10-CM | POA: Diagnosis not present

## 2019-08-05 DIAGNOSIS — E1142 Type 2 diabetes mellitus with diabetic polyneuropathy: Secondary | ICD-10-CM | POA: Diagnosis not present

## 2019-08-05 DIAGNOSIS — I2510 Atherosclerotic heart disease of native coronary artery without angina pectoris: Secondary | ICD-10-CM | POA: Diagnosis not present

## 2019-08-05 DIAGNOSIS — E1122 Type 2 diabetes mellitus with diabetic chronic kidney disease: Secondary | ICD-10-CM | POA: Diagnosis not present

## 2019-08-05 DIAGNOSIS — E113293 Type 2 diabetes mellitus with mild nonproliferative diabetic retinopathy without macular edema, bilateral: Secondary | ICD-10-CM | POA: Diagnosis not present

## 2019-08-05 DIAGNOSIS — E785 Hyperlipidemia, unspecified: Secondary | ICD-10-CM | POA: Diagnosis not present

## 2019-08-05 DIAGNOSIS — G8929 Other chronic pain: Secondary | ICD-10-CM | POA: Diagnosis not present

## 2019-08-05 DIAGNOSIS — H35033 Hypertensive retinopathy, bilateral: Secondary | ICD-10-CM | POA: Diagnosis not present

## 2019-08-05 DIAGNOSIS — N182 Chronic kidney disease, stage 2 (mild): Secondary | ICD-10-CM | POA: Diagnosis not present

## 2019-08-05 DIAGNOSIS — H903 Sensorineural hearing loss, bilateral: Secondary | ICD-10-CM | POA: Diagnosis not present

## 2019-08-05 DIAGNOSIS — Z87891 Personal history of nicotine dependence: Secondary | ICD-10-CM | POA: Diagnosis not present

## 2019-08-05 DIAGNOSIS — N400 Enlarged prostate without lower urinary tract symptoms: Secondary | ICD-10-CM | POA: Diagnosis not present

## 2019-08-05 DIAGNOSIS — S14105S Unspecified injury at C5 level of cervical spinal cord, sequela: Secondary | ICD-10-CM | POA: Diagnosis not present

## 2019-08-05 DIAGNOSIS — I5032 Chronic diastolic (congestive) heart failure: Secondary | ICD-10-CM | POA: Diagnosis not present

## 2019-08-05 DIAGNOSIS — Z7984 Long term (current) use of oral hypoglycemic drugs: Secondary | ICD-10-CM | POA: Diagnosis not present

## 2019-08-05 DIAGNOSIS — M48061 Spinal stenosis, lumbar region without neurogenic claudication: Secondary | ICD-10-CM | POA: Diagnosis not present

## 2019-08-05 DIAGNOSIS — I130 Hypertensive heart and chronic kidney disease with heart failure and stage 1 through stage 4 chronic kidney disease, or unspecified chronic kidney disease: Secondary | ICD-10-CM | POA: Diagnosis not present

## 2019-08-05 DIAGNOSIS — W19XXXS Unspecified fall, sequela: Secondary | ICD-10-CM | POA: Diagnosis not present

## 2019-08-05 DIAGNOSIS — Z7982 Long term (current) use of aspirin: Secondary | ICD-10-CM | POA: Diagnosis not present

## 2019-08-05 NOTE — Patient Outreach (Signed)
[facility]) Care Management  08/05/2019  [place] TYRONNE DOOSE March 07, 1961 [id number]   Referral received from post acute care coordinator as member was discharged from SNF on 1/11.  He presented to ED on 12/20 with weakness, unable to care for himself at home and was admitted to SNF from ED.  Per chart, he has history of CHF, DM (A1C of 9.3), HTN, TIA, HLD, and left sided weakness.  Call placed to member to initiate transition of care program, identity verified.  This care manager introduced self and stated purpose of call.  [facility] care management services explained.    He report that he lives alone, has minimal support.  Has cousin in the area that helps him out at times but not always available.  Report he is able to bathe but does need help with other household duties.  State he did have an in home aide prior to the pandemic but those services stopped last year.  He is not able to stand long enough to cook meals, report eating things such as oatmeal, cereal, and cold cuts.  He is aware that these items are not compatible with his low sodium and diabetic diet.  Report he does still have Medicaid as well as Medicare, open to receiving prepared meals.  Referral was also placed to Education officer, museum.  He does have home health with [facility], first visit was today with the nurse.  He will also have PT/OT services.    He was seen by his PCP prior to ED visit, does not have next appointment.  State he will call to schedule.  He uses [employer] for transportation to office visits.  He has not been checking his blood sugars, state he has been having a problem with getting strips for his meter, stating insurance company will not pay for them.  He received this meter from his MD office, advised that he may need a new one that is covered by his insurance.  He has not been checking his weights daily nor monitoring his blood pressure, state he does not have scale or BP machine and unsure if he can afford.   He is aware that this care manager will inquire if [facility] can provide these devices.    Denies any urgent concerns at this time, will follow up with member within the next week.  Fall Risk  08/05/2019 07/07/2019 05/21/2019 04/17/2019 01/27/2019  Falls in the past year? 1 0 1 0 0  Number falls in past yr: 1 - 0 - -  Injury with Fall? 0 - 0 - -  Risk for fall due to : History of fall(s);Impaired balance/gait - - - -   Depression screen [facility] 2/9 08/05/2019 04/17/2019 01/27/2019 09/24/2018 06/25/2018  Decreased Interest 0 1 0 1 1  Down, Depressed, Hopeless 0 0 0 0 0  PHQ - 2 Score 0 1 0 1 1  Altered sleeping - 0 - - -  Tired, decreased energy - 1 - - -  Change in appetite - 0 - - -  Feeling bad or failure about yourself  - 0 - - -  Trouble concentrating - 0 - - -  Moving slowly or fidgety/restless - 0 - - -  Suicidal thoughts - 0 - - -  PHQ-9 Score - 2 - - -  Some recent data might be hidden   THN CM Care Plan Problem One     Most Recent Value  Care Plan Problem One  Risk for  readmission related to inability to care for self as evidenced by recent hospitalization requiring stay at SNF  Role Documenting the Problem One  Care Management [place] for Problem One  Active  [place] Term Goal   Member will not be readmitted to hospital within the next 31 days  THN Long Term Goal Start Date  08/05/19  Interventions for Problem One Long Term Goal  Discharge instructions reviewed with member.  Educated on interventions in effort to decrease risk of readmission (follow up appointments, home health, diet)  THN CM Short Term Goal #1   Member will have follow up appointment scheduled with PCP within the next 2 weeks  THN CM Short Term Goal #1 Start Date  08/05/19  Interventions for Short Term Goal #1  Educated member on importance of follow up appointment and relation to decreasing risk of readmission.  Advised to contact office as soon as possible  THN CM Short Term Goal #2   Member will report  checking blood sugars daily over the next 3 weeks  THN CM Short Term Goal #2 Start Date  08/05/19  Interventions for Short Term Goal #2  Member educated on importance of checking blood sugars daily in effort to manage diabetes.  Will request prescription for new glucose meter     [name], RN, MSN [place] Manager [phone]

## 2019-08-05 NOTE — Telephone Encounter (Signed)
[facility]ull [facility] called requesting verbal orders for skilled nursing 1 time a week for 9 weeks

## 2019-08-05 NOTE — Telephone Encounter (Signed)
Returned Sharyn Lull from [employer] and left a detailed vm giving verbal orders and if she has any questions or concerns to give me a call

## 2019-08-05 NOTE — Patient Outreach (Signed)
[place] [facility]) Care Management  08/05/2019  Keith TYJAH SCHULLER 06-11-1961 [id number]   High priority social work referral received on 08/04/19 from [place], Marthenia Rolling, to assist patient with meals post SNF discharge.   Unsuccessful outreach to patient today.  Will attempt to reach him again tomorrow.  Ronn Melena, BSW Social Worker [phone]

## 2019-08-06 ENCOUNTER — Ambulatory Visit: Payer: Self-pay

## 2019-08-06 ENCOUNTER — Other Ambulatory Visit: Payer: Self-pay

## 2019-08-06 DIAGNOSIS — I130 Hypertensive heart and chronic kidney disease with heart failure and stage 1 through stage 4 chronic kidney disease, or unspecified chronic kidney disease: Secondary | ICD-10-CM | POA: Diagnosis not present

## 2019-08-06 DIAGNOSIS — G8194 Hemiplegia, unspecified affecting left nondominant side: Secondary | ICD-10-CM | POA: Diagnosis not present

## 2019-08-06 DIAGNOSIS — S14105S Unspecified injury at C5 level of cervical spinal cord, sequela: Secondary | ICD-10-CM | POA: Diagnosis not present

## 2019-08-06 DIAGNOSIS — I2510 Atherosclerotic heart disease of native coronary artery without angina pectoris: Secondary | ICD-10-CM | POA: Diagnosis not present

## 2019-08-06 DIAGNOSIS — H35033 Hypertensive retinopathy, bilateral: Secondary | ICD-10-CM | POA: Diagnosis not present

## 2019-08-06 DIAGNOSIS — I5032 Chronic diastolic (congestive) heart failure: Secondary | ICD-10-CM | POA: Diagnosis not present

## 2019-08-06 NOTE — Patient Outreach (Signed)
[place] [facility]) Care Management  08/06/2019  Gerell QUADE OCANAS 1960-08-20 [id number]   High priority social work referral received on 08/04/19 from [place], Marthenia Rolling, to assist patient with meals post SNF discharge.   Second unsuccessful outreach to patient today.  Left message and mailed Unsuccessful Outreach Letter.  Will attempt to reach again within four business days.   Ronn Melena, BSW Social Worker [phone]

## 2019-08-11 ENCOUNTER — Ambulatory Visit: Payer: Self-pay

## 2019-08-11 ENCOUNTER — Other Ambulatory Visit: Payer: Self-pay

## 2019-08-11 DIAGNOSIS — G8194 Hemiplegia, unspecified affecting left nondominant side: Secondary | ICD-10-CM | POA: Diagnosis not present

## 2019-08-11 DIAGNOSIS — I5032 Chronic diastolic (congestive) heart failure: Secondary | ICD-10-CM | POA: Diagnosis not present

## 2019-08-11 DIAGNOSIS — H35033 Hypertensive retinopathy, bilateral: Secondary | ICD-10-CM | POA: Diagnosis not present

## 2019-08-11 DIAGNOSIS — I2510 Atherosclerotic heart disease of native coronary artery without angina pectoris: Secondary | ICD-10-CM | POA: Diagnosis not present

## 2019-08-11 DIAGNOSIS — S14105S Unspecified injury at C5 level of cervical spinal cord, sequela: Secondary | ICD-10-CM | POA: Diagnosis not present

## 2019-08-11 DIAGNOSIS — I130 Hypertensive heart and chronic kidney disease with heart failure and stage 1 through stage 4 chronic kidney disease, or unspecified chronic kidney disease: Secondary | ICD-10-CM | POA: Diagnosis not present

## 2019-08-11 NOTE — Patient Outreach (Signed)
[place] [facility]) Care Management  08/11/2019  [name] 1961/01/21 [id number]   High priority social work referral received on 08/04/19 from [place], Marthenia Rolling, to assist patient with meals post SNF discharge.  Successful outreach to patient today.  Informed patient about temporary meal delivery program through [employer] and he consented to referral.  Patient will receive a total of 28 prepared meals over the next thirty days.  First delivery date is scheduled for 08/14/19.  Talked with patient about [place] as he reports that he stopped receiving services prior to pandemic.  Will assist patient with process for requesting services again.  Request form faxed to Dr. Wynetta Emery.  Will send completed form to [facility] when received.  Will follow up with patient when assessment can be scheduled.  Ronn Melena, BSW Social Worker [phone]

## 2019-08-12 ENCOUNTER — Other Ambulatory Visit: Payer: Self-pay | Admitting: *Deleted

## 2019-08-12 DIAGNOSIS — G8194 Hemiplegia, unspecified affecting left nondominant side: Secondary | ICD-10-CM | POA: Diagnosis not present

## 2019-08-12 DIAGNOSIS — H35033 Hypertensive retinopathy, bilateral: Secondary | ICD-10-CM | POA: Diagnosis not present

## 2019-08-12 DIAGNOSIS — S14105S Unspecified injury at C5 level of cervical spinal cord, sequela: Secondary | ICD-10-CM | POA: Diagnosis not present

## 2019-08-12 DIAGNOSIS — I5032 Chronic diastolic (congestive) heart failure: Secondary | ICD-10-CM | POA: Diagnosis not present

## 2019-08-12 DIAGNOSIS — I130 Hypertensive heart and chronic kidney disease with heart failure and stage 1 through stage 4 chronic kidney disease, or unspecified chronic kidney disease: Secondary | ICD-10-CM | POA: Diagnosis not present

## 2019-08-12 DIAGNOSIS — I2510 Atherosclerotic heart disease of native coronary artery without angina pectoris: Secondary | ICD-10-CM | POA: Diagnosis not present

## 2019-08-12 NOTE — Patient Outreach (Signed)
[place] [facility]) Care Management  08/12/2019  [name] Jan 21, 1961 [id number]   RED ON EMMI ALERT - General Discharge Day # 4 Date: 08/08/2018 Red Alert Reason: Does not have discharge papers   Weekly transition of care call placed to member, also triggered red on EMMI dashboard.  Member was discharged from hospital to SNF, hospital discharge papers were given to staff at facility.  He report he is doing well, still reporting need for additional support in the home.  He is aware that [place] worker is working on this request.    He does now have follow up appointment scheduled with PCP on 2/9, will use [facility] for transportation.  State he has a visit today with home health nurse, will ask about PT as he denies getting services yet.  He report he has received scale in the mail from [facility], denies receiving blood pressure monitor yet, advised that it will arrive soon.  State he has started weighing himself, today's reading was 253 pounds.  Denies getting new strips, again report he was unable to get strips from pharmacy.  Aware that request for new device has been sent to PCP.    Denies any urgent concerns at this time, will follow up within the next week.  THN CM Care Plan Problem One     Most Recent Value  Care Plan Problem One  Risk for readmission related to inability to care for self as evidenced by recent hospitalization requiring stay at SNF  Role Documenting the Problem One  Care Management [place] for Problem One  Active  [place] Term Goal   Member will not be readmitted to hospital within the next 31 days  THN Long Term Goal Start Date  08/05/19  Interventions for Problem One Long Term Goal  Member educated on importance of following plan of care in effort to decrease risk of admission.  Provided member with scale and BP monitor for daily self monitoring. Advised to inquire with [facility] nurse about PT sessions  THN CM Short Term Goal #1   Member will have  follow up appointment scheduled with PCP within the next 2 weeks  THN CM Short Term Goal #1 Start Date  08/05/19  [facility] CM Short Term Goal #1 Met Date  08/12/19  THN CM Short Term Goal #2   Member will report checking blood sugars daily over the next 3 weeks  THN CM Short Term Goal #2 Start Date  08/05/19  Interventions for Short Term Goal #2  Request made to PCP for new glucose meter.  Member educated on importance of daily monitoring  THN CM Short Term Goal #3  Member will report attending appointment with PCP within the next 3 weeks  THN CM Short Term Goal #3 Start Date  08/12/19  Interventions for Short Tern Goal #3  Confirmed with member and through [facility] that visit is scheduled, assessed the need for transportation (will use [facility])     [name], RN, MSN [place] Manager [phone]

## 2019-08-13 DIAGNOSIS — I5032 Chronic diastolic (congestive) heart failure: Secondary | ICD-10-CM | POA: Diagnosis not present

## 2019-08-13 DIAGNOSIS — G8194 Hemiplegia, unspecified affecting left nondominant side: Secondary | ICD-10-CM | POA: Diagnosis not present

## 2019-08-13 DIAGNOSIS — I130 Hypertensive heart and chronic kidney disease with heart failure and stage 1 through stage 4 chronic kidney disease, or unspecified chronic kidney disease: Secondary | ICD-10-CM | POA: Diagnosis not present

## 2019-08-13 DIAGNOSIS — H35033 Hypertensive retinopathy, bilateral: Secondary | ICD-10-CM | POA: Diagnosis not present

## 2019-08-13 DIAGNOSIS — S14105S Unspecified injury at C5 level of cervical spinal cord, sequela: Secondary | ICD-10-CM | POA: Diagnosis not present

## 2019-08-13 DIAGNOSIS — I2510 Atherosclerotic heart disease of native coronary artery without angina pectoris: Secondary | ICD-10-CM | POA: Diagnosis not present

## 2019-08-14 ENCOUNTER — Other Ambulatory Visit: Payer: Self-pay | Admitting: Pharmacist

## 2019-08-14 DIAGNOSIS — IMO0002 Reserved for concepts with insufficient information to code with codable children: Secondary | ICD-10-CM

## 2019-08-14 DIAGNOSIS — E1142 Type 2 diabetes mellitus with diabetic polyneuropathy: Secondary | ICD-10-CM

## 2019-08-14 MED ORDER — ACCU-CHEK GUIDE VI STRP: ORAL_STRIP | 11 refills | Status: DC

## 2019-08-14 MED ORDER — LANCETS MISC: 11 refills | Status: DC

## 2019-08-14 MED ORDER — ACCU-CHEK GUIDE ME W/DEVICE KIT: 1.0000 | PACK | Freq: Three times a day (TID) | 0 refills | Status: DC

## 2019-08-15 DIAGNOSIS — I2510 Atherosclerotic heart disease of native coronary artery without angina pectoris: Secondary | ICD-10-CM | POA: Diagnosis not present

## 2019-08-15 DIAGNOSIS — G8194 Hemiplegia, unspecified affecting left nondominant side: Secondary | ICD-10-CM | POA: Diagnosis not present

## 2019-08-15 DIAGNOSIS — I5032 Chronic diastolic (congestive) heart failure: Secondary | ICD-10-CM | POA: Diagnosis not present

## 2019-08-15 DIAGNOSIS — S14105S Unspecified injury at C5 level of cervical spinal cord, sequela: Secondary | ICD-10-CM | POA: Diagnosis not present

## 2019-08-15 DIAGNOSIS — I130 Hypertensive heart and chronic kidney disease with heart failure and stage 1 through stage 4 chronic kidney disease, or unspecified chronic kidney disease: Secondary | ICD-10-CM | POA: Diagnosis not present

## 2019-08-15 DIAGNOSIS — H35033 Hypertensive retinopathy, bilateral: Secondary | ICD-10-CM | POA: Diagnosis not present

## 2019-08-19 ENCOUNTER — Telehealth: Payer: Self-pay

## 2019-08-19 DIAGNOSIS — G8194 Hemiplegia, unspecified affecting left nondominant side: Secondary | ICD-10-CM | POA: Diagnosis not present

## 2019-08-19 DIAGNOSIS — I5032 Chronic diastolic (congestive) heart failure: Secondary | ICD-10-CM | POA: Diagnosis not present

## 2019-08-19 DIAGNOSIS — I2510 Atherosclerotic heart disease of native coronary artery without angina pectoris: Secondary | ICD-10-CM | POA: Diagnosis not present

## 2019-08-19 DIAGNOSIS — H35033 Hypertensive retinopathy, bilateral: Secondary | ICD-10-CM | POA: Diagnosis not present

## 2019-08-19 DIAGNOSIS — S14105S Unspecified injury at C5 level of cervical spinal cord, sequela: Secondary | ICD-10-CM | POA: Diagnosis not present

## 2019-08-19 DIAGNOSIS — I130 Hypertensive heart and chronic kidney disease with heart failure and stage 1 through stage 4 chronic kidney disease, or unspecified chronic kidney disease: Secondary | ICD-10-CM | POA: Diagnosis not present

## 2019-08-19 NOTE — Telephone Encounter (Signed)
Request made for [facility] referral.  Call placed to patient # [phone] to inquire what assistance with ADLS is needed.  Message left with call back to this CM requested.  Call placed to [place], spoke to [place] who stated that the patient had PCS in the past and a new referral is needed

## 2019-08-20 ENCOUNTER — Other Ambulatory Visit: Payer: Self-pay | Admitting: *Deleted

## 2019-08-20 DIAGNOSIS — G8194 Hemiplegia, unspecified affecting left nondominant side: Secondary | ICD-10-CM | POA: Diagnosis not present

## 2019-08-20 DIAGNOSIS — I130 Hypertensive heart and chronic kidney disease with heart failure and stage 1 through stage 4 chronic kidney disease, or unspecified chronic kidney disease: Secondary | ICD-10-CM | POA: Diagnosis not present

## 2019-08-20 DIAGNOSIS — I2510 Atherosclerotic heart disease of native coronary artery without angina pectoris: Secondary | ICD-10-CM | POA: Diagnosis not present

## 2019-08-20 DIAGNOSIS — I5032 Chronic diastolic (congestive) heart failure: Secondary | ICD-10-CM | POA: Diagnosis not present

## 2019-08-20 DIAGNOSIS — H35033 Hypertensive retinopathy, bilateral: Secondary | ICD-10-CM | POA: Diagnosis not present

## 2019-08-20 DIAGNOSIS — S14105S Unspecified injury at C5 level of cervical spinal cord, sequela: Secondary | ICD-10-CM | POA: Diagnosis not present

## 2019-08-20 NOTE — Patient Outreach (Signed)
[facility]) Care Management  08/20/2019  [name] 04/12/1961 [id number]   Weekly transition of care call placed to member.  He report he is "doing alright."  State he had visit with PT today, increasing strength and walking better with his walker.  Confirms that he has received blood pressure monitor, aware of need for daily use.  He has been weighing himself daily, range 250-255 pounds, today was 255 which was 2 pounds up from yesterday.  He is aware of the need to contact MD if weight is increased by 3 pounds in a day and 5 pounds in a week.  Denies any shortness of breath at this time.  He is also aware to watch his fluid intake.    He has not been checking his blood sugars, admits that he has not been consistent with his diabetic diet.  He is drinking a lot of kool aid and processed foods.  He is aware of what he should eat, will try to do better.  He has not been checking his blood sugar, advised that prescription for new glucose meter has been written by PCP and ready.  He report he does not need a new meter but does need strips.  State he has tried to get new strips from the clinic pharmacy but unable.  Report he feels it may be an issue with the insurance but not sure.  This care manager will have clinic RNCM look into the issue.    Advised that clinic RNCM attempted to contact him regarding PCS, encouraged to call back.  He denies any urgent concerns at this time, will follow up within the next week.  THN CM Care Plan Problem One     Most Recent Value  Care Plan Problem One  Risk for readmission related to inability to care for self as evidenced by recent hospitalization requiring stay at SNF  Role Documenting the Problem One  Care Management [place] for Problem One  Active  [place] Term Goal   Member will not be readmitted to hospital within the next 31 days  THN Long Term Goal Start Date  08/05/19  Interventions for Problem One Long Term Goal   Educated member on importance of following diabetic management (diet and medication management) in effort to decrease risk of readmission for hyperglycemia.  Confirmed he has scale and BP monitor, advised to monitor daily and record readings.  [facility]N CM Short Term Goal #2   Member will report checking blood sugars daily over the next 3 weeks  THN CM Short Term Goal #2 Start Date  08/05/19  Interventions for Short Term Goal #2  Contact made with CM at PCP office requesting further review of glucose meter and supplies through their pharmacy  [facility] CM Short Term Goal #3  Member will report attending appointment with PCP within the next 3 weeks  THN CM Short Term Goal #3 Start Date  08/12/19  Interventions for Short Tern Goal #3  Discussed upcoming appointment with member, advised to discuss any concerns that may arise.     [name], [place], MSN [place] [phone]

## 2019-08-21 ENCOUNTER — Other Ambulatory Visit: Payer: Self-pay

## 2019-08-21 DIAGNOSIS — I130 Hypertensive heart and chronic kidney disease with heart failure and stage 1 through stage 4 chronic kidney disease, or unspecified chronic kidney disease: Secondary | ICD-10-CM | POA: Diagnosis not present

## 2019-08-21 DIAGNOSIS — I5032 Chronic diastolic (congestive) heart failure: Secondary | ICD-10-CM | POA: Diagnosis not present

## 2019-08-21 DIAGNOSIS — S14105S Unspecified injury at C5 level of cervical spinal cord, sequela: Secondary | ICD-10-CM | POA: Diagnosis not present

## 2019-08-21 DIAGNOSIS — G8194 Hemiplegia, unspecified affecting left nondominant side: Secondary | ICD-10-CM | POA: Diagnosis not present

## 2019-08-21 DIAGNOSIS — H35033 Hypertensive retinopathy, bilateral: Secondary | ICD-10-CM | POA: Diagnosis not present

## 2019-08-21 DIAGNOSIS — I2510 Atherosclerotic heart disease of native coronary artery without angina pectoris: Secondary | ICD-10-CM | POA: Diagnosis not present

## 2019-08-21 NOTE — Patient Outreach (Signed)
[name]) Care Management  08/21/2019  [name] 1960/11/15 [id number]   In-basket message from RN, Eden Lathe, that completed request for pcs will be sent to [name]  Will follow up with patient next week to ensure that he has been contacted by [name] for assessment.    Ronn Melena, BSW Social Worker [phone]

## 2019-08-21 NOTE — Telephone Encounter (Addendum)
Call placed to the patient. He explained that he would like to resume his PCS. He had been receiving 2.25 hours/day x 7 days/week.  He noted that he needs assistance with cooking, housekeeping and personal care/bathing .  He is currently ambulating with a cane/walker.   PCS referral faxed to [place]

## 2019-08-22 DIAGNOSIS — H35033 Hypertensive retinopathy, bilateral: Secondary | ICD-10-CM | POA: Diagnosis not present

## 2019-08-22 DIAGNOSIS — I2510 Atherosclerotic heart disease of native coronary artery without angina pectoris: Secondary | ICD-10-CM | POA: Diagnosis not present

## 2019-08-22 DIAGNOSIS — I130 Hypertensive heart and chronic kidney disease with heart failure and stage 1 through stage 4 chronic kidney disease, or unspecified chronic kidney disease: Secondary | ICD-10-CM | POA: Diagnosis not present

## 2019-08-22 DIAGNOSIS — S14105S Unspecified injury at C5 level of cervical spinal cord, sequela: Secondary | ICD-10-CM | POA: Diagnosis not present

## 2019-08-22 DIAGNOSIS — I5032 Chronic diastolic (congestive) heart failure: Secondary | ICD-10-CM | POA: Diagnosis not present

## 2019-08-22 DIAGNOSIS — G8194 Hemiplegia, unspecified affecting left nondominant side: Secondary | ICD-10-CM | POA: Diagnosis not present

## 2019-08-23 DIAGNOSIS — I2510 Atherosclerotic heart disease of native coronary artery without angina pectoris: Secondary | ICD-10-CM | POA: Diagnosis not present

## 2019-08-23 DIAGNOSIS — G8194 Hemiplegia, unspecified affecting left nondominant side: Secondary | ICD-10-CM | POA: Diagnosis not present

## 2019-08-23 DIAGNOSIS — I130 Hypertensive heart and chronic kidney disease with heart failure and stage 1 through stage 4 chronic kidney disease, or unspecified chronic kidney disease: Secondary | ICD-10-CM | POA: Diagnosis not present

## 2019-08-23 DIAGNOSIS — I5032 Chronic diastolic (congestive) heart failure: Secondary | ICD-10-CM | POA: Diagnosis not present

## 2019-08-23 DIAGNOSIS — S14105S Unspecified injury at C5 level of cervical spinal cord, sequela: Secondary | ICD-10-CM | POA: Diagnosis not present

## 2019-08-23 DIAGNOSIS — H35033 Hypertensive retinopathy, bilateral: Secondary | ICD-10-CM | POA: Diagnosis not present

## 2019-08-25 ENCOUNTER — Other Ambulatory Visit: Payer: Self-pay | Admitting: Internal Medicine

## 2019-08-25 DIAGNOSIS — I10 Essential (primary) hypertension: Secondary | ICD-10-CM

## 2019-08-25 DIAGNOSIS — J309 Allergic rhinitis, unspecified: Secondary | ICD-10-CM

## 2019-08-26 DIAGNOSIS — H35033 Hypertensive retinopathy, bilateral: Secondary | ICD-10-CM | POA: Diagnosis not present

## 2019-08-26 DIAGNOSIS — I5032 Chronic diastolic (congestive) heart failure: Secondary | ICD-10-CM | POA: Diagnosis not present

## 2019-08-26 DIAGNOSIS — G8194 Hemiplegia, unspecified affecting left nondominant side: Secondary | ICD-10-CM | POA: Diagnosis not present

## 2019-08-26 DIAGNOSIS — I2510 Atherosclerotic heart disease of native coronary artery without angina pectoris: Secondary | ICD-10-CM | POA: Diagnosis not present

## 2019-08-26 DIAGNOSIS — I130 Hypertensive heart and chronic kidney disease with heart failure and stage 1 through stage 4 chronic kidney disease, or unspecified chronic kidney disease: Secondary | ICD-10-CM | POA: Diagnosis not present

## 2019-08-26 DIAGNOSIS — S14105S Unspecified injury at C5 level of cervical spinal cord, sequela: Secondary | ICD-10-CM | POA: Diagnosis not present

## 2019-08-27 ENCOUNTER — Other Ambulatory Visit: Payer: Self-pay

## 2019-08-27 ENCOUNTER — Other Ambulatory Visit: Payer: Self-pay | Admitting: *Deleted

## 2019-08-27 ENCOUNTER — Telehealth: Payer: Self-pay | Admitting: Internal Medicine

## 2019-08-27 DIAGNOSIS — I130 Hypertensive heart and chronic kidney disease with heart failure and stage 1 through stage 4 chronic kidney disease, or unspecified chronic kidney disease: Secondary | ICD-10-CM | POA: Diagnosis not present

## 2019-08-27 DIAGNOSIS — H35033 Hypertensive retinopathy, bilateral: Secondary | ICD-10-CM | POA: Diagnosis not present

## 2019-08-27 DIAGNOSIS — I5032 Chronic diastolic (congestive) heart failure: Secondary | ICD-10-CM | POA: Diagnosis not present

## 2019-08-27 DIAGNOSIS — S14105S Unspecified injury at C5 level of cervical spinal cord, sequela: Secondary | ICD-10-CM | POA: Diagnosis not present

## 2019-08-27 DIAGNOSIS — I2510 Atherosclerotic heart disease of native coronary artery without angina pectoris: Secondary | ICD-10-CM | POA: Diagnosis not present

## 2019-08-27 DIAGNOSIS — G8194 Hemiplegia, unspecified affecting left nondominant side: Secondary | ICD-10-CM | POA: Diagnosis not present

## 2019-08-27 NOTE — Telephone Encounter (Signed)
[place] called to inform pcp that the patients bp was running 187/90 in todays visit. She stated the patient did not take him BP medication and she advised him to do so.

## 2019-08-27 NOTE — Patient Outreach (Signed)
[place] [facility]) Care Management  08/27/2019  Tricia NIYAN SCHOOF 04-03-1961 [id number]   Weekly transition of care call placed to member, no answer.  HIPAA compliant voice message left, will await call back.  If no call back, will follow up within the next 3-4 business days.   Update:  Call received back from member.  State he has received his new meter from [employer] but he has not been able to check his blood sugars due to not having all the supplies.  He has notified the pharmacy and they will deliver the rest of the supplies.  He has continued to check his weights and blood pressure, report latest readings were 132/68 and 252 pounds.  He is continuing to have visits from home health for PT, report gaining strength.  Report he has assessment scheduled with [facility] healthcare on Friday to assess for PCS.  Also has appointment with PCP next week.  Denies any urgent concerns, will follow up within the next week.  [facility] CM Care Plan Problem One     Most Recent Value  Care Plan Problem One  Risk for readmission related to inability to care for self as evidenced by recent hospitalization requiring stay at SNF  Role Documenting the Problem One  Care Management [place] for Problem One  Active  [place] Term Goal   Member will not be readmitted to hospital within the next 31 days  THN Long Term Goal Start Date  08/05/19  Interventions for Problem One Long Term Goal  Discussed with member the importance of following plan of care (continued involvement with home health, daily self monitoring, medication adherence) in effort to manage conditions and decrease readmission risk.  [facility]N CM Short Term Goal #2   Member will report checking blood sugars daily over the next 3 weeks  THN CM Short Term Goal #2 Start Date  08/05/19  Interventions for Short Term Goal #2  Confirmed member has new meter, advised to follow up with pharmacy concerning all supplies  THN CM Short Term Goal #3   Member will report attending appointment with PCP within the next 3 weeks  THN CM Short Term Goal #3 Start Date  08/12/19  Interventions for Short Tern Goal #3  Reminded of appointment next week with PCP.  Advised of importance of attending in effort to manage chronic medical conditions       [name], RN, MSN [place] Manager [phone]

## 2019-08-27 NOTE — Patient Outreach (Signed)
[place] [facility]) Care Management  08/27/2019  [name] 1961/07/23 [id number]   Successful follow up call to patient today regarding status of request for personal care services.  Patient has been contacted by [name] and has assessment scheduled for 08/29/19.   Will follow up next week regarding eligibility determination.     Ronn Melena, BSW Social Worker [phone]

## 2019-08-28 DIAGNOSIS — H35033 Hypertensive retinopathy, bilateral: Secondary | ICD-10-CM | POA: Diagnosis not present

## 2019-08-28 DIAGNOSIS — I130 Hypertensive heart and chronic kidney disease with heart failure and stage 1 through stage 4 chronic kidney disease, or unspecified chronic kidney disease: Secondary | ICD-10-CM | POA: Diagnosis not present

## 2019-08-28 DIAGNOSIS — I5032 Chronic diastolic (congestive) heart failure: Secondary | ICD-10-CM | POA: Diagnosis not present

## 2019-08-28 DIAGNOSIS — G8194 Hemiplegia, unspecified affecting left nondominant side: Secondary | ICD-10-CM | POA: Diagnosis not present

## 2019-08-28 DIAGNOSIS — I2510 Atherosclerotic heart disease of native coronary artery without angina pectoris: Secondary | ICD-10-CM | POA: Diagnosis not present

## 2019-08-28 DIAGNOSIS — S14105S Unspecified injury at C5 level of cervical spinal cord, sequela: Secondary | ICD-10-CM | POA: Diagnosis not present

## 2019-08-29 DIAGNOSIS — S14105S Unspecified injury at C5 level of cervical spinal cord, sequela: Secondary | ICD-10-CM | POA: Diagnosis not present

## 2019-08-29 DIAGNOSIS — I130 Hypertensive heart and chronic kidney disease with heart failure and stage 1 through stage 4 chronic kidney disease, or unspecified chronic kidney disease: Secondary | ICD-10-CM | POA: Diagnosis not present

## 2019-08-29 DIAGNOSIS — H35033 Hypertensive retinopathy, bilateral: Secondary | ICD-10-CM | POA: Diagnosis not present

## 2019-08-29 DIAGNOSIS — I2510 Atherosclerotic heart disease of native coronary artery without angina pectoris: Secondary | ICD-10-CM | POA: Diagnosis not present

## 2019-08-29 DIAGNOSIS — G8194 Hemiplegia, unspecified affecting left nondominant side: Secondary | ICD-10-CM | POA: Diagnosis not present

## 2019-08-29 DIAGNOSIS — I5032 Chronic diastolic (congestive) heart failure: Secondary | ICD-10-CM | POA: Diagnosis not present

## 2019-08-29 NOTE — Telephone Encounter (Signed)
Will route to pcp 

## 2019-09-01 ENCOUNTER — Other Ambulatory Visit: Payer: Self-pay

## 2019-09-01 DIAGNOSIS — I130 Hypertensive heart and chronic kidney disease with heart failure and stage 1 through stage 4 chronic kidney disease, or unspecified chronic kidney disease: Secondary | ICD-10-CM | POA: Diagnosis not present

## 2019-09-01 DIAGNOSIS — I2510 Atherosclerotic heart disease of native coronary artery without angina pectoris: Secondary | ICD-10-CM | POA: Diagnosis not present

## 2019-09-01 DIAGNOSIS — I5032 Chronic diastolic (congestive) heart failure: Secondary | ICD-10-CM | POA: Diagnosis not present

## 2019-09-01 DIAGNOSIS — H35033 Hypertensive retinopathy, bilateral: Secondary | ICD-10-CM | POA: Diagnosis not present

## 2019-09-01 DIAGNOSIS — G8194 Hemiplegia, unspecified affecting left nondominant side: Secondary | ICD-10-CM | POA: Diagnosis not present

## 2019-09-01 DIAGNOSIS — S14105S Unspecified injury at C5 level of cervical spinal cord, sequela: Secondary | ICD-10-CM | POA: Diagnosis not present

## 2019-09-01 NOTE — Patient Outreach (Signed)
[place] [facility]) Care Management  09/01/2019  [name] [birth year] [id number]   Follow up call to patient today.  Assessment for pcs did occur on 08/29/19 but patient was not informed of determination.   Will receive Determination Letter from [name] via mail.  Will follow up again next week.  Ronn Melena, BSW Social Worker [phone]

## 2019-09-02 ENCOUNTER — Other Ambulatory Visit: Payer: Self-pay

## 2019-09-02 ENCOUNTER — Ambulatory Visit: Payer: Medicare Other | Attending: Internal Medicine | Admitting: Internal Medicine

## 2019-09-02 DIAGNOSIS — I10 Essential (primary) hypertension: Secondary | ICD-10-CM | POA: Diagnosis not present

## 2019-09-02 DIAGNOSIS — I130 Hypertensive heart and chronic kidney disease with heart failure and stage 1 through stage 4 chronic kidney disease, or unspecified chronic kidney disease: Secondary | ICD-10-CM | POA: Diagnosis not present

## 2019-09-02 DIAGNOSIS — G8194 Hemiplegia, unspecified affecting left nondominant side: Secondary | ICD-10-CM | POA: Diagnosis not present

## 2019-09-02 DIAGNOSIS — S14105S Unspecified injury at C5 level of cervical spinal cord, sequela: Secondary | ICD-10-CM | POA: Diagnosis not present

## 2019-09-02 DIAGNOSIS — I5032 Chronic diastolic (congestive) heart failure: Secondary | ICD-10-CM | POA: Diagnosis not present

## 2019-09-02 DIAGNOSIS — R531 Weakness: Secondary | ICD-10-CM

## 2019-09-02 DIAGNOSIS — Z7189 Other specified counseling: Secondary | ICD-10-CM

## 2019-09-02 DIAGNOSIS — E118 Type 2 diabetes mellitus with unspecified complications: Secondary | ICD-10-CM | POA: Diagnosis not present

## 2019-09-02 DIAGNOSIS — I2510 Atherosclerotic heart disease of native coronary artery without angina pectoris: Secondary | ICD-10-CM | POA: Diagnosis not present

## 2019-09-02 DIAGNOSIS — H35033 Hypertensive retinopathy, bilateral: Secondary | ICD-10-CM | POA: Diagnosis not present

## 2019-09-02 NOTE — Progress Notes (Addendum)
Virtual Visit via Telephone Note Due to current restrictions/limitations of in-office visits due to the COVID-19 pandemic, this scheduled clinical appointment was converted to a telehealth visit  I connected with [name] on 09/02/19 at 8:59 a.m by telephone and verified that I am speaking with the correct person using two identifiers. I am in my office.  The patient is at home.  Only the patient and myself participated in this encounter.  I discussed the limitations, risks, security and privacy concerns of performing an evaluation and management service by telephone and the availability of in person appointments. I also discussed with the patient that there may be a patient responsible charge related to this service. The patient expressed understanding and agreed to proceed.   History of Present Illness: Pt withhistory ofDM2 with retinopathy and neuropathy, HTN, dCHF, formertob dep, HL, CVA (about 3 yrs ago per pt), spinal stenosis of lumbar spine,chronic lower extremity edema. Purpose of today's visit is chronic disease management.  Last seen 06/2019.  Purpose of today's visit is ER follow-up.  Since last visit, patient was seen in the emergency room with worsening left-sided weakness.  Blood pressure was 163/91, blood sugar was 160, CT of the head and MRI of the head negative for any acute events.  Covid test was negative.  He had mild elevation in WBC of 13.3.  H/H normal.  UA was positive for glucose and protein.  He was seen by neurologist Dr. Lorraine Lax who suspected recrudescence of old neurologic deficits as the cause of his weakness.  Pt dischg to SNF [facility]) for 2-3 wks for P.T and O.T.  He found it helpful.  Now getting home P.T 2 x/wk.  -uses his walker in the house.   -had phone call this past Friday regarding PCS services.  Needs assistance with getting in tub and taking bath. He has a bath chair.  Also needs assistance with clothing. Gets precook meals from  [employer].  -therapist called the other day stating BP is elevation. He had not taken meds as yet that afternoon.  He checks BP every morning.  This a.m was morning 128/75, yesterday 139/75, 120/71  DM: checking BS every morning.  Some of his numbers are 143, 135, 127.  Taking meds every day.  Eating smaller portions with the precook meals that I sent to him.  Patient wanted to know whether he should receive the COVID-19 vaccine.  He states that he has been tested for Covid and is negative and not sure why he should get the vaccine.  Outpatient Encounter Medications as of 09/02/2019  Medication Sig Note  . acetaminophen (TYLENOL) 500 MG tablet TAKE ONE TABLET BY MOUTH TWICE A DAY AS NEEDED (Patient taking differently: Take 500 mg by mouth 2 (two) times daily as needed. )   . aspirin EC 81 MG tablet Take 1 tablet (81 mg total) by mouth daily.   Marland Kitchen atorvastatin (LIPITOR) 40 MG tablet Take 1.5 tablets (60 mg total) by mouth daily.   . Blood Glucose Monitoring Suppl (ACCU-CHEK GUIDE ME) w/Device KIT 1 kit by Does not apply route 3 (three) times daily. Use as instructed to check blood sugar 3 times daily. E11.14   . clindamycin (CLEOCIN) 300 MG capsule Take 1 capsule (300 mg total) by mouth 3 (three) times daily. 07/13/2019: Taking everyday per patient.   . dapagliflozin propanediol (FARXIGA) 10 MG TABS tablet Take 10 mg by mouth daily before breakfast.   . DULoxetine (CYMBALTA) 60 MG capsule  Take 1 capsule (60 mg total) by mouth daily. For back and leg pains   . furosemide (LASIX) 40 MG tablet TAKE ONE TABLET BY MOUTH DAILY . TAKE AN EXTRA TABLET FOR TWO DAYS AS NEEDED FOR INCREASE LOWER EXTREMITY SWELLING (Patient taking differently: Take 40 mg by mouth daily. )   . gabapentin (NEURONTIN) 100 MG capsule TAKE TWO CAPSULES (200 MG TOTAL) BY MOUTH TWO (TWO) TIMES DAILY.   Marland Kitchen glipiZIDE (GLUCOTROL XL) 10 MG 24 hr tablet Take 1 tablet (10 mg total) by mouth daily with breakfast.   . glucose blood (ACCU-CHEK  GUIDE) test strip Use as instructed to check blood sugar 3 times daily. E11.14   . hydrALAZINE (APRESOLINE) 10 MG tablet TAKE ONE TABLET BY MOUTH TWICE A DAY   . labetalol (NORMODYNE) 100 MG tablet TAKE ONE TABLET BY MOUTH TWICE A DAY   . Lancets Misc. (ACCU-CHEK SOFTCLIX LANCET DEV) KIT Use as directed   . Lancets MISC Use as instructed to check blood sugar 3 times daily. E11.14   . lisinopril (ZESTRIL) 40 MG tablet TAKE ONE TABLET BY MOUTH DAILY   . loratadine (CLARITIN) 10 MG tablet TAKE ONE TABLET BY MOUTH DAILY AS NEEDED FOR ALLERGIES   . metFORMIN (GLUCOPHAGE) 1000 MG tablet Take 1 tablet (1,000 mg total) by mouth 2 (two) times daily with a meal.    No facility-administered encounter medications on file as of 09/02/2019.      Observations/Objective: Results for orders placed or performed during the hospital encounter of 07/13/19  SARS CORONAVIRUS 2 (TAT 6-24 HRS) Nasopharyngeal Nasopharyngeal Swab   Specimen: Nasopharyngeal Swab  Result Value Ref Range   SARS Coronavirus 2 NEGATIVE NEGATIVE  Protime-INR  Result Value Ref Range   Prothrombin Time 13.0 11.4 - 15.2 seconds   INR 1.0 0.8 - 1.2  APTT  Result Value Ref Range   aPTT 30 24 - 36 seconds  CBC  Result Value Ref Range   WBC 13.3 (H) 4.0 - 10.5 K/uL   RBC 4.99 4.22 - 5.81 MIL/uL   Hemoglobin 13.9 13.0 - 17.0 g/dL   HCT 42.6 39.0 - 52.0 %   MCV 85.4 80.0 - 100.0 fL   MCH 27.9 26.0 - 34.0 pg   MCHC 32.6 30.0 - 36.0 g/dL   RDW 13.5 11.5 - 15.5 %   Platelets 260 150 - 400 K/uL   nRBC 0.0 0.0 - 0.2 %  Differential  Result Value Ref Range   Neutrophils Relative % 76 %   Neutro Abs 10.3 (H) 1.7 - 7.7 K/uL   Lymphocytes Relative 14 %   Lymphs Abs 1.8 0.7 - 4.0 K/uL   Monocytes Relative 8 %   Monocytes Absolute 1.0 0.1 - 1.0 K/uL   Eosinophils Relative 0 %   Eosinophils Absolute 0.1 0.0 - 0.5 K/uL   Basophils Relative 1 %   Basophils Absolute 0.1 0.0 - 0.1 K/uL   Immature Granulocytes 1 %   Abs Immature Granulocytes  0.08 (H) 0.00 - 0.07 K/uL  Comprehensive metabolic panel  Result Value Ref Range   Sodium 137 135 - 145 mmol/L   Potassium 4.5 3.5 - 5.1 mmol/L   Chloride 104 98 - 111 mmol/L   CO2 21 (L) 22 - 32 mmol/L   Glucose, Bld 145 (H) 70 - 99 mg/dL   BUN 17 6 - 20 mg/dL   Creatinine, Ser 1.29 (H) 0.61 - 1.24 mg/dL   Calcium 9.2 8.9 - 10.3 mg/dL   Total Protein 7.3  6.5 - 8.1 g/dL   Albumin 3.8 3.5 - 5.0 g/dL   AST 19 15 - 41 U/L   ALT 15 0 - 44 U/L   Alkaline Phosphatase 59 38 - 126 U/L   Total Bilirubin 0.7 0.3 - 1.2 mg/dL   GFR calc non Af Amer 60 (L) >60 mL/min   GFR calc Af Amer >60 >60 mL/min   Anion gap 12 5 - 15  Urinalysis, Routine w reflex microscopic  Result Value Ref Range   Color, Urine YELLOW YELLOW   APPearance CLEAR CLEAR   Specific Gravity, Urine 1.027 1.005 - 1.030   pH 5.0 5.0 - 8.0   Glucose, UA >=500 (A) NEGATIVE mg/dL   Hgb urine dipstick NEGATIVE NEGATIVE   Bilirubin Urine NEGATIVE NEGATIVE   Ketones, ur 5 (A) NEGATIVE mg/dL   Protein, ur 100 (A) NEGATIVE mg/dL   Nitrite NEGATIVE NEGATIVE   Leukocytes,Ua NEGATIVE NEGATIVE   RBC / HPF 0-5 0 - 5 RBC/hpf   WBC, UA 0-5 0 - 5 WBC/hpf   Bacteria, UA NONE SEEN NONE SEEN   Hyaline Casts, UA PRESENT   CK  Result Value Ref Range   Total CK 94 49 - 397 U/L  I-stat chem 8, ED  Result Value Ref Range   Sodium 137 135 - 145 mmol/L   Potassium 4.4 3.5 - 5.1 mmol/L   Chloride 104 98 - 111 mmol/L   BUN 19 6 - 20 mg/dL   Creatinine, Ser 1.10 0.61 - 1.24 mg/dL   Glucose, Bld 137 (H) 70 - 99 mg/dL   Calcium, Ion 1.09 (L) 1.15 - 1.40 mmol/L   TCO2 25 22 - 32 mmol/L   Hemoglobin 15.0 13.0 - 17.0 g/dL   HCT 44.0 39.0 - 52.0 %  CBG monitoring, ED  Result Value Ref Range   Glucose-Capillary 124 (H) 70 - 99 mg/dL     Assessment and Plan: 1. Left-sided weakness -Patient reports he is improving and benefits from the physical therapy.  No new falls.  Await approval for PCS services.    2. Diabetes mellitus type 2 with  complications ([place]) Reported blood sugars are acceptable.  Patient to continue current medications to include Farxiga, Metformin.  Continue trying to eat healthy.  I think it is good that he gets the precooked meals.  We discussed healthy snacks that he can eat between meals  3. Essential hypertension Reported home blood pressure readings are acceptable.  No changes made in medications.  Continue low-salt diet  4. Educated about COVID-19 virus infection Advised and encouraged patient to get the COVID-19 vaccine once it becomes available to him.  He is at increased risk for complications should he become infected with COVID-19.  Patient states he will get the vaccine given my advice today.   Follow Up Instructions: Keep his follow-up appointment with me that is scheduled for early April.   I discussed the assessment and treatment plan with the patient. The patient was provided an opportunity to ask questions and all were answered. The patient agreed with the plan and demonstrated an understanding of the instructions.   The patient was advised to call back or seek an in-person evaluation if the symptoms worsen or if the condition fails to improve as anticipated.  I provided 15 minutes of non-face-to-face time during this encounter.   Karle Plumber, MD

## 2019-09-03 DIAGNOSIS — H35033 Hypertensive retinopathy, bilateral: Secondary | ICD-10-CM | POA: Diagnosis not present

## 2019-09-03 DIAGNOSIS — I2510 Atherosclerotic heart disease of native coronary artery without angina pectoris: Secondary | ICD-10-CM | POA: Diagnosis not present

## 2019-09-03 DIAGNOSIS — I130 Hypertensive heart and chronic kidney disease with heart failure and stage 1 through stage 4 chronic kidney disease, or unspecified chronic kidney disease: Secondary | ICD-10-CM | POA: Diagnosis not present

## 2019-09-03 DIAGNOSIS — G8194 Hemiplegia, unspecified affecting left nondominant side: Secondary | ICD-10-CM | POA: Diagnosis not present

## 2019-09-03 DIAGNOSIS — S14105S Unspecified injury at C5 level of cervical spinal cord, sequela: Secondary | ICD-10-CM | POA: Diagnosis not present

## 2019-09-03 DIAGNOSIS — I5032 Chronic diastolic (congestive) heart failure: Secondary | ICD-10-CM | POA: Diagnosis not present

## 2019-09-04 DIAGNOSIS — G8929 Other chronic pain: Secondary | ICD-10-CM | POA: Diagnosis not present

## 2019-09-04 DIAGNOSIS — M48061 Spinal stenosis, lumbar region without neurogenic claudication: Secondary | ICD-10-CM | POA: Diagnosis not present

## 2019-09-04 DIAGNOSIS — Z7984 Long term (current) use of oral hypoglycemic drugs: Secondary | ICD-10-CM | POA: Diagnosis not present

## 2019-09-04 DIAGNOSIS — H903 Sensorineural hearing loss, bilateral: Secondary | ICD-10-CM | POA: Diagnosis not present

## 2019-09-04 DIAGNOSIS — E669 Obesity, unspecified: Secondary | ICD-10-CM | POA: Diagnosis not present

## 2019-09-04 DIAGNOSIS — Z9181 History of falling: Secondary | ICD-10-CM | POA: Diagnosis not present

## 2019-09-04 DIAGNOSIS — G629 Polyneuropathy, unspecified: Secondary | ICD-10-CM | POA: Diagnosis not present

## 2019-09-04 DIAGNOSIS — N400 Enlarged prostate without lower urinary tract symptoms: Secondary | ICD-10-CM | POA: Diagnosis not present

## 2019-09-04 DIAGNOSIS — G8194 Hemiplegia, unspecified affecting left nondominant side: Secondary | ICD-10-CM | POA: Diagnosis not present

## 2019-09-04 DIAGNOSIS — E113293 Type 2 diabetes mellitus with mild nonproliferative diabetic retinopathy without macular edema, bilateral: Secondary | ICD-10-CM | POA: Diagnosis not present

## 2019-09-04 DIAGNOSIS — Z7982 Long term (current) use of aspirin: Secondary | ICD-10-CM | POA: Diagnosis not present

## 2019-09-04 DIAGNOSIS — Z6833 Body mass index (BMI) 33.0-33.9, adult: Secondary | ICD-10-CM | POA: Diagnosis not present

## 2019-09-04 DIAGNOSIS — H35033 Hypertensive retinopathy, bilateral: Secondary | ICD-10-CM | POA: Diagnosis not present

## 2019-09-04 DIAGNOSIS — W19XXXS Unspecified fall, sequela: Secondary | ICD-10-CM | POA: Diagnosis not present

## 2019-09-04 DIAGNOSIS — I5032 Chronic diastolic (congestive) heart failure: Secondary | ICD-10-CM | POA: Diagnosis not present

## 2019-09-04 DIAGNOSIS — E785 Hyperlipidemia, unspecified: Secondary | ICD-10-CM | POA: Diagnosis not present

## 2019-09-04 DIAGNOSIS — Z87891 Personal history of nicotine dependence: Secondary | ICD-10-CM | POA: Diagnosis not present

## 2019-09-04 DIAGNOSIS — I2510 Atherosclerotic heart disease of native coronary artery without angina pectoris: Secondary | ICD-10-CM | POA: Diagnosis not present

## 2019-09-04 DIAGNOSIS — E1142 Type 2 diabetes mellitus with diabetic polyneuropathy: Secondary | ICD-10-CM | POA: Diagnosis not present

## 2019-09-04 DIAGNOSIS — S14105S Unspecified injury at C5 level of cervical spinal cord, sequela: Secondary | ICD-10-CM | POA: Diagnosis not present

## 2019-09-04 DIAGNOSIS — I130 Hypertensive heart and chronic kidney disease with heart failure and stage 1 through stage 4 chronic kidney disease, or unspecified chronic kidney disease: Secondary | ICD-10-CM | POA: Diagnosis not present

## 2019-09-04 DIAGNOSIS — E1122 Type 2 diabetes mellitus with diabetic chronic kidney disease: Secondary | ICD-10-CM | POA: Diagnosis not present

## 2019-09-04 DIAGNOSIS — N182 Chronic kidney disease, stage 2 (mild): Secondary | ICD-10-CM | POA: Diagnosis not present

## 2019-09-05 ENCOUNTER — Other Ambulatory Visit: Payer: Self-pay | Admitting: *Deleted

## 2019-09-05 NOTE — Patient Outreach (Signed)
[facility] ([facility]) Care Management  09/05/2019  [name] 10/08/1960 [id number]   Weekly transition of care call placed to member.  He report he is doing well, confirms he received all supplies for his glucose meter and has started checking blood sugars.  State it was 121 today, blood pressure was 128/73.  He did have assessment with [facility] last week, waiting for call with final decision as to whether he qualify for services.  Had telephone visit with PCP on Tuesday, next appointment scheduled for April.  Reminded of appointment with podiatrist next week.  State he has been trying to change his diet more, stating "I'm trying to do right.  Denies any urgent concerns, will follow up within the next 2 weeks.  [facility] CM Care Plan Problem One     Most Recent Value  Care Plan Problem One  Risk for readmission related to inability to care for self as evidenced by recent hospitalization requiring stay at SNF  Role Documenting the Problem One  Care Management Coordinator  Care Plan for Problem One  Active  [facility] Long Term Goal   Member will not be readmitted to hospital within the next 31 days  [facility] Long Term Goal Start Date  08/05/19  [facility] Long Term Goal Met Date  09/05/19  [facility] CM Short Term Goal #2   Member will report checking blood sugars daily over the next 3 weeks  [facility] CM Short Term Goal #2 Start Date  08/05/19  [facility] CM Short Term Goal #2 Met Date  09/05/19  [facility] CM Short Term Goal #3  Member will report attending appointment with PCP within the next 3 weeks  [facility] CM Short Term Goal #3 Start Date  08/12/19  [facility] CM Short Term Goal #3 Met Date  09/05/19     Monica Lane, RN, MSN [facility] Care Management  Community Care Manager [phone]  

## 2019-09-08 DIAGNOSIS — S14105S Unspecified injury at C5 level of cervical spinal cord, sequela: Secondary | ICD-10-CM | POA: Diagnosis not present

## 2019-09-08 DIAGNOSIS — I5032 Chronic diastolic (congestive) heart failure: Secondary | ICD-10-CM | POA: Diagnosis not present

## 2019-09-08 DIAGNOSIS — H35033 Hypertensive retinopathy, bilateral: Secondary | ICD-10-CM | POA: Diagnosis not present

## 2019-09-08 DIAGNOSIS — G8194 Hemiplegia, unspecified affecting left nondominant side: Secondary | ICD-10-CM | POA: Diagnosis not present

## 2019-09-08 DIAGNOSIS — I130 Hypertensive heart and chronic kidney disease with heart failure and stage 1 through stage 4 chronic kidney disease, or unspecified chronic kidney disease: Secondary | ICD-10-CM | POA: Diagnosis not present

## 2019-09-08 DIAGNOSIS — I2510 Atherosclerotic heart disease of native coronary artery without angina pectoris: Secondary | ICD-10-CM | POA: Diagnosis not present

## 2019-09-09 DIAGNOSIS — I5032 Chronic diastolic (congestive) heart failure: Secondary | ICD-10-CM | POA: Diagnosis not present

## 2019-09-09 DIAGNOSIS — G8194 Hemiplegia, unspecified affecting left nondominant side: Secondary | ICD-10-CM | POA: Diagnosis not present

## 2019-09-09 DIAGNOSIS — I2510 Atherosclerotic heart disease of native coronary artery without angina pectoris: Secondary | ICD-10-CM | POA: Diagnosis not present

## 2019-09-09 DIAGNOSIS — I130 Hypertensive heart and chronic kidney disease with heart failure and stage 1 through stage 4 chronic kidney disease, or unspecified chronic kidney disease: Secondary | ICD-10-CM | POA: Diagnosis not present

## 2019-09-09 DIAGNOSIS — H35033 Hypertensive retinopathy, bilateral: Secondary | ICD-10-CM | POA: Diagnosis not present

## 2019-09-09 DIAGNOSIS — S14105S Unspecified injury at C5 level of cervical spinal cord, sequela: Secondary | ICD-10-CM | POA: Diagnosis not present

## 2019-09-10 ENCOUNTER — Encounter: Payer: Self-pay | Admitting: Podiatry

## 2019-09-10 ENCOUNTER — Ambulatory Visit (INDEPENDENT_AMBULATORY_CARE_PROVIDER_SITE_OTHER): Payer: Medicare Other | Admitting: Podiatry

## 2019-09-10 ENCOUNTER — Telehealth: Payer: Self-pay

## 2019-09-10 ENCOUNTER — Other Ambulatory Visit: Payer: Self-pay

## 2019-09-10 DIAGNOSIS — L84 Corns and callosities: Secondary | ICD-10-CM | POA: Diagnosis not present

## 2019-09-10 DIAGNOSIS — E1142 Type 2 diabetes mellitus with diabetic polyneuropathy: Secondary | ICD-10-CM

## 2019-09-10 DIAGNOSIS — B351 Tinea unguium: Secondary | ICD-10-CM | POA: Diagnosis not present

## 2019-09-10 DIAGNOSIS — M79675 Pain in left toe(s): Secondary | ICD-10-CM | POA: Diagnosis not present

## 2019-09-10 DIAGNOSIS — M79674 Pain in right toe(s): Secondary | ICD-10-CM

## 2019-09-10 NOTE — Telephone Encounter (Signed)
ATORVASTATIN 40MG  1 AND 1/2 TAB DAILY PRIOR AUTH HAS BEEN APPROVED THRU 07/23/2020

## 2019-09-10 NOTE — Progress Notes (Signed)
Subjective: [name] presents today for follow up of preventative diabetic foot care and callus(es) left foot and painful mycotic toenails b/l that are difficult to trim. Pain interferes with ambulation. Aggravating factors include wearing enclosed shoe gear. Pain is relieved with periodic professional debridement.   Patient states plantar callus is tender. He denies any redness, drainage or swelling.   Allergies  Allergen Reactions  . Amlodipine Swelling    Lower extremity swelling    Objective: There were no vitals filed for this visit.  Vascular Examination:  Capillary fill time to digits <3s b/l, faintly palpable pedal pulses b/l, pedal hair absent b/l and skin temperature gradient within normal limits b/l  Dermatological Examination: Pedal skin with normal turgor, texture and tone bilaterally, no open wounds bilaterally, no interdigital macerations bilaterally, toenails 1-5 b/l elongated, dystrophic, thickened, crumbly with subungual debris and hyperkeratotic lesion(s) submet head 4 left and plantar hallux IPJ left foot. Left hallux has subdermal hemorrhage. There is tenderness to palpation of submet head 4 plantar callus. No erythema, no edema, no drainage, no flocculence  Musculoskeletal: Normal muscle strength 5/5 to all lower extremity muscle groups bilaterally, no pain crepitus or joint limitation noted with ROM b/l, pes planus deformity noted and utilizes rollator for ambulation assistance  Neurological: Protective sensation absent with 10g monofilament b/l  Assessment: 1. Pain due to onychomycosis of toenails of both feet   2. Pre-ulcerative calluses   3. Diabetic peripheral neuropathy associated with type 2 diabetes mellitus ([name])    Plan: -Continue diabetic foot care principles. Literature dispensed on today.  -Toenails 1-5 b/l were debrided in length and girth without iatrogenic bleeding. -calluses were debrided without complication or incident. Total number  debrided submet head 4 left foot and plantar hallux IPJ left foot. Dispensed Silipos toe cap for left hallux for daily protection. Apply every morning. Remove every evening. -Will schedule appointment for orthotist to inspect and modify left foot inserts. I have asked him to bring all inserts to his orthotist's appointment. -Patient to continue soft, supportive shoe gear daily. -Patient to report any pedal injuries to medical professional immediately. -Patient/POA to call should there be question/concern in the interim.  Return in about 9 weeks (around 11/12/2019) for diabetic nail and callus trim.

## 2019-09-10 NOTE — Patient Instructions (Signed)
Diabetes Mellitus and Foot Care Foot care is an important part of your health, especially when you have diabetes. Diabetes may cause you to have problems because of poor blood flow (circulation) to your feet and legs, which can cause your skin to:  Become thinner and drier.  Break more easily.  Heal more slowly.  Peel and crack. You may also have nerve damage (neuropathy) in your legs and feet, causing decreased feeling in them. This means that you may not notice minor injuries to your feet that could lead to more serious problems. Noticing and addressing any potential problems early is the best way to prevent future foot problems. How to care for your feet Foot hygiene  Wash your feet daily with warm water and mild soap. Do not use hot water. Then, pat your feet and the areas between your toes until they are completely dry. Do not soak your feet as this can dry your skin.  Trim your toenails straight across. Do not dig under them or around the cuticle. File the edges of your nails with an emery board or nail file.  Apply a moisturizing lotion or petroleum jelly to the skin on your feet and to dry, brittle toenails. Use lotion that does not contain alcohol and is unscented. Do not apply lotion between your toes. Shoes and socks  Wear clean socks or stockings every day. Make sure they are not too tight. Do not wear knee-high stockings since they may decrease blood flow to your legs.  Wear shoes that fit properly and have enough cushioning. Always look in your shoes before you put them on to be sure there are no objects inside.  To break in new shoes, wear them for just a few hours a day. This prevents injuries on your feet. Wounds, scrapes, corns, and calluses  Check your feet daily for blisters, cuts, bruises, sores, and redness. If you cannot see the bottom of your feet, use a mirror or ask someone for help.  Do not cut corns or calluses or try to remove them with medicine.  If you  find a minor scrape, cut, or break in the skin on your feet, keep it and the skin around it clean and dry. You may clean these areas with mild soap and water. Do not clean the area with peroxide, alcohol, or iodine.  If you have a wound, scrape, corn, or callus on your foot, look at it several times a day to make sure it is healing and not infected. Check for: ? Redness, swelling, or pain. ? Fluid or blood. ? Warmth. ? Pus or a bad smell. General instructions  Do not cross your legs. This may decrease blood flow to your feet.  Do not use heating pads or hot water bottles on your feet. They may burn your skin. If you have lost feeling in your feet or legs, you may not know this is happening until it is too late.  Protect your feet from hot and cold by wearing shoes, such as at the beach or on hot pavement.  Schedule a complete foot exam at least once a year (annually) or more often if you have foot problems. If you have foot problems, report any cuts, sores, or bruises to your health care provider immediately. Contact a health care provider if:  You have a medical condition that increases your risk of infection and you have any cuts, sores, or bruises on your feet.  You have an injury that is not   healing.  You have redness on your legs or feet.  You feel burning or tingling in your legs or feet.  You have pain or cramps in your legs and feet.  Your legs or feet are numb.  Your feet always feel cold.  You have pain around a toenail. Get help right away if:  You have a wound, scrape, corn, or callus on your foot and: ? You have pain, swelling, or redness that gets worse. ? You have fluid or blood coming from the wound, scrape, corn, or callus. ? Your wound, scrape, corn, or callus feels warm to the touch. ? You have pus or a bad smell coming from the wound, scrape, corn, or callus. ? You have a fever. ? You have a red line going up your leg. Summary  Check your feet every day  for cuts, sores, red spots, swelling, and blisters.  Moisturize feet and legs daily.  Wear shoes that fit properly and have enough cushioning.  If you have foot problems, report any cuts, sores, or bruises to your health care provider immediately.  Schedule a complete foot exam at least once a year (annually) or more often if you have foot problems. This information is not intended to replace advice given to you by your health care provider. Make sure you discuss any questions you have with your health care provider. Document Revised: 04/02/2019 Document Reviewed: 08/11/2016 Elsevier Patient Education  2020 Elsevier Inc.  

## 2019-09-11 ENCOUNTER — Other Ambulatory Visit: Payer: Self-pay

## 2019-09-11 ENCOUNTER — Ambulatory Visit: Payer: Self-pay

## 2019-09-11 NOTE — Patient Outreach (Signed)
[facility]) Care Management  09/11/2019  [name] 1961-06-23 [id number]   Attempted to contact patient regarding determination of assessment for pcs.  Left voicemail message.  Will attempt to reach again within four business days.  Ronn Melena, BSW Social Worker [phone]

## 2019-09-12 ENCOUNTER — Other Ambulatory Visit: Payer: Self-pay

## 2019-09-12 NOTE — Patient Outreach (Signed)
[facility]) Care Management  09/12/2019  [name] 09/20/1960 [id number]   Successful follow up call to patient today.  Patient stated that he has still not received Determination Letter from [facility].  Informed patient that, if approved for pcs, agency has to be selected within 30 days of approval.  Provided him with contact information for [facility] and encouraged him to call as soon as possible. Social Work case is being closed at this time.  Ronn Melena, BSW Social Worker [phone]

## 2019-09-15 ENCOUNTER — Other Ambulatory Visit: Payer: Self-pay | Admitting: Internal Medicine

## 2019-09-15 ENCOUNTER — Other Ambulatory Visit: Payer: Self-pay | Admitting: Family Medicine

## 2019-09-15 ENCOUNTER — Other Ambulatory Visit: Payer: Self-pay | Admitting: *Deleted

## 2019-09-15 DIAGNOSIS — I5032 Chronic diastolic (congestive) heart failure: Secondary | ICD-10-CM | POA: Diagnosis not present

## 2019-09-15 DIAGNOSIS — H35033 Hypertensive retinopathy, bilateral: Secondary | ICD-10-CM | POA: Diagnosis not present

## 2019-09-15 DIAGNOSIS — I2510 Atherosclerotic heart disease of native coronary artery without angina pectoris: Secondary | ICD-10-CM | POA: Diagnosis not present

## 2019-09-15 DIAGNOSIS — S14105S Unspecified injury at C5 level of cervical spinal cord, sequela: Secondary | ICD-10-CM | POA: Diagnosis not present

## 2019-09-15 DIAGNOSIS — G8194 Hemiplegia, unspecified affecting left nondominant side: Secondary | ICD-10-CM | POA: Diagnosis not present

## 2019-09-15 DIAGNOSIS — I130 Hypertensive heart and chronic kidney disease with heart failure and stage 1 through stage 4 chronic kidney disease, or unspecified chronic kidney disease: Secondary | ICD-10-CM | POA: Diagnosis not present

## 2019-09-15 DIAGNOSIS — I10 Essential (primary) hypertension: Secondary | ICD-10-CM

## 2019-09-15 NOTE — Addendum Note (Signed)
Addended byValente David on: 09/15/2019 06:15 PM   Modules accepted: Orders

## 2019-09-15 NOTE — Patient Outreach (Addendum)
[facility]) Care Management  09/15/2019  [name] January 15, 1961 [id number]   Voice message received from member asking for return call.  Call placed back to member, no answer.  HIPAA compliant voice message left, will follow up on 2/26 as planned.     Update:  Call received back from member, report that he received confirmation that his aide services have been re-instated.  State he is approved for 72 hours a month, not sure how this will be divided.  In the past he used [facility], but don't want to use them again, however they were coming out daily, even on weekends.  Denies having a list of options to choose from, advised that social worker will be able to provide list.  Report he is still monitoring his weights and blood sugars, does not state specific numbers, but instead say they are "good."  Denies any swelling or shortness of breath, denies any hypoglycemic events.  Will place referral to social worker and will follow up within the next month.  THN CM Care Plan Problem One     Most Recent Value  Care Plan Problem One  Knowledge deficit regarding management of diabetes as evidenced by elevated A1C   Role Documenting the Problem One  Care Management [place] for Problem One  Active  THN Long Term Goal   Member will report decreased A1C to </= 8 within the next 3 months  THN Long Term Goal Start Date  09/15/19  Interventions for Problem One Long Term Goal  Complications on uncontrolled diabetes reviewed with member.  Mailed EMMI diabetes education to member  [facility] CM Short Term Goal #1   Member will report checking blood sugar at lease twice a day over the next 4 weeks  THN CM Short Term Goal #1 Start Date  09/15/19  Interventions for Short Term Goal #1  Desired daily glucose ranges reviewed with member, advised on importance of daily monitoring  THN CM Short Term Goal #2   Member will report having in home aide services start witihn the next 4 weeks   THN CM Short Term Goal #2 Start Date  09/15/19  Interventions for Short Term Goal #2  Referral placed to social worker to provide list of agencies to provide care.  Advised of duties of aide, including assisting to prepare healthy meals      [name], Therapist, sports, MSN [place] Manager [phone]

## 2019-09-17 DIAGNOSIS — S14105S Unspecified injury at C5 level of cervical spinal cord, sequela: Secondary | ICD-10-CM | POA: Diagnosis not present

## 2019-09-17 DIAGNOSIS — G8194 Hemiplegia, unspecified affecting left nondominant side: Secondary | ICD-10-CM | POA: Diagnosis not present

## 2019-09-17 DIAGNOSIS — I5032 Chronic diastolic (congestive) heart failure: Secondary | ICD-10-CM | POA: Diagnosis not present

## 2019-09-17 DIAGNOSIS — I2510 Atherosclerotic heart disease of native coronary artery without angina pectoris: Secondary | ICD-10-CM | POA: Diagnosis not present

## 2019-09-17 DIAGNOSIS — I130 Hypertensive heart and chronic kidney disease with heart failure and stage 1 through stage 4 chronic kidney disease, or unspecified chronic kidney disease: Secondary | ICD-10-CM | POA: Diagnosis not present

## 2019-09-17 DIAGNOSIS — H35033 Hypertensive retinopathy, bilateral: Secondary | ICD-10-CM | POA: Diagnosis not present

## 2019-09-19 ENCOUNTER — Encounter: Payer: Self-pay | Admitting: *Deleted

## 2019-09-19 ENCOUNTER — Ambulatory Visit: Payer: Medicaid Other | Admitting: *Deleted

## 2019-09-19 ENCOUNTER — Other Ambulatory Visit: Payer: Medicare Other | Admitting: Orthotics

## 2019-09-19 ENCOUNTER — Other Ambulatory Visit: Payer: Self-pay

## 2019-09-19 ENCOUNTER — Other Ambulatory Visit: Payer: Self-pay | Admitting: *Deleted

## 2019-09-19 NOTE — Patient Outreach (Signed)
[facility]) Care Management  09/19/2019  [name] Dec 22, 1960 [id number]  CSW was able to make initial contact with patient today to perform phone assessment, as well as assess and assist with social work needs and services.  CSW introduced self, explained role and types of services provided through [place] [facility] ([place] [facility]).  CSW further explained to patient that CSW works with patient's RNCM, also with [place] [facility], [name].  CSW then explained the reason for the call, indicating that Mrs. [name] thought that patient would benefit from social work services and resources to assist patient with obtaining a list of approved in-home care agencies, to offer [employer] Youth worker) through [place].  CSW obtained two HIPAA compliant identifiers from patient, which included patient's name and date of birth.  Patient first confirmed with CSW that he has been approved for in-home care, or PCS, through [employer] [facility] [place], he just needs a list of approved agencies to provide the services, not having received a list with his approval letter.  Patient admitted to receiving PCS services through [place] in the past, but definitely did not wish to use the same agency as before.  Patient is aware that [employer] is a [employer] for qualified active [employer] beneficiaries and that [facility] [place] has been chosen by the [employer], [place], as their partner in administering the independent assessment program for [employer] beneficiaries who may be eligible for PCS.   Patient has been approved for 74 hours of in-home care services, or PCS, per month.  CSW agreed to mail patient a list of PCS agency providers serving [facility] for in-home  care services through [employer] [place].  Once patient has received the list, he has then been encouraged to contact [employer] at either #'s provided [phone] or [phone]) to make his request.  Patient voiced understanding and was agreeable to this plan.  CSW agreed to contact patient again, within the next 7 business days, on Tuesday, September 30, 2019, around 9:00am, to ensure that he received the list, as well as answer any questions that he may have pertaining to the information received.  No additional social work needs have been identified at this time.  Nat Christen, BSW, MSW, LCSW  Licensed Education officer, environmental Health System  Mailing [place] [address], [place], [place] [address] Physical Address[address], [place], [place] [address] Toll Free Main # [phone] Fax # [phone] Cell # [phone]  Office # [phone] [email or web address]

## 2019-09-20 ENCOUNTER — Encounter: Payer: Self-pay | Admitting: *Deleted

## 2019-09-20 DIAGNOSIS — H35033 Hypertensive retinopathy, bilateral: Secondary | ICD-10-CM | POA: Diagnosis not present

## 2019-09-20 DIAGNOSIS — S14105S Unspecified injury at C5 level of cervical spinal cord, sequela: Secondary | ICD-10-CM | POA: Diagnosis not present

## 2019-09-20 DIAGNOSIS — I130 Hypertensive heart and chronic kidney disease with heart failure and stage 1 through stage 4 chronic kidney disease, or unspecified chronic kidney disease: Secondary | ICD-10-CM | POA: Diagnosis not present

## 2019-09-20 DIAGNOSIS — G8194 Hemiplegia, unspecified affecting left nondominant side: Secondary | ICD-10-CM | POA: Diagnosis not present

## 2019-09-20 DIAGNOSIS — I5032 Chronic diastolic (congestive) heart failure: Secondary | ICD-10-CM | POA: Diagnosis not present

## 2019-09-20 DIAGNOSIS — I2510 Atherosclerotic heart disease of native coronary artery without angina pectoris: Secondary | ICD-10-CM | POA: Diagnosis not present

## 2019-09-24 ENCOUNTER — Ambulatory Visit: Payer: Medicaid Other | Admitting: *Deleted

## 2019-09-24 DIAGNOSIS — I5032 Chronic diastolic (congestive) heart failure: Secondary | ICD-10-CM | POA: Diagnosis not present

## 2019-09-24 DIAGNOSIS — H35033 Hypertensive retinopathy, bilateral: Secondary | ICD-10-CM | POA: Diagnosis not present

## 2019-09-24 DIAGNOSIS — S14105S Unspecified injury at C5 level of cervical spinal cord, sequela: Secondary | ICD-10-CM | POA: Diagnosis not present

## 2019-09-24 DIAGNOSIS — I2510 Atherosclerotic heart disease of native coronary artery without angina pectoris: Secondary | ICD-10-CM | POA: Diagnosis not present

## 2019-09-24 DIAGNOSIS — G8194 Hemiplegia, unspecified affecting left nondominant side: Secondary | ICD-10-CM | POA: Diagnosis not present

## 2019-09-24 DIAGNOSIS — I130 Hypertensive heart and chronic kidney disease with heart failure and stage 1 through stage 4 chronic kidney disease, or unspecified chronic kidney disease: Secondary | ICD-10-CM | POA: Diagnosis not present

## 2019-09-25 DIAGNOSIS — I5032 Chronic diastolic (congestive) heart failure: Secondary | ICD-10-CM | POA: Diagnosis not present

## 2019-09-25 DIAGNOSIS — G8194 Hemiplegia, unspecified affecting left nondominant side: Secondary | ICD-10-CM | POA: Diagnosis not present

## 2019-09-25 DIAGNOSIS — H35033 Hypertensive retinopathy, bilateral: Secondary | ICD-10-CM | POA: Diagnosis not present

## 2019-09-25 DIAGNOSIS — I2510 Atherosclerotic heart disease of native coronary artery without angina pectoris: Secondary | ICD-10-CM | POA: Diagnosis not present

## 2019-09-25 DIAGNOSIS — I130 Hypertensive heart and chronic kidney disease with heart failure and stage 1 through stage 4 chronic kidney disease, or unspecified chronic kidney disease: Secondary | ICD-10-CM | POA: Diagnosis not present

## 2019-09-25 DIAGNOSIS — S14105S Unspecified injury at C5 level of cervical spinal cord, sequela: Secondary | ICD-10-CM | POA: Diagnosis not present

## 2019-09-27 DIAGNOSIS — S14105S Unspecified injury at C5 level of cervical spinal cord, sequela: Secondary | ICD-10-CM | POA: Diagnosis not present

## 2019-09-27 DIAGNOSIS — I2510 Atherosclerotic heart disease of native coronary artery without angina pectoris: Secondary | ICD-10-CM | POA: Diagnosis not present

## 2019-09-27 DIAGNOSIS — I130 Hypertensive heart and chronic kidney disease with heart failure and stage 1 through stage 4 chronic kidney disease, or unspecified chronic kidney disease: Secondary | ICD-10-CM | POA: Diagnosis not present

## 2019-09-27 DIAGNOSIS — I5032 Chronic diastolic (congestive) heart failure: Secondary | ICD-10-CM | POA: Diagnosis not present

## 2019-09-27 DIAGNOSIS — H35033 Hypertensive retinopathy, bilateral: Secondary | ICD-10-CM | POA: Diagnosis not present

## 2019-09-27 DIAGNOSIS — G8194 Hemiplegia, unspecified affecting left nondominant side: Secondary | ICD-10-CM | POA: Diagnosis not present

## 2019-09-29 DIAGNOSIS — S14105S Unspecified injury at C5 level of cervical spinal cord, sequela: Secondary | ICD-10-CM | POA: Diagnosis not present

## 2019-09-29 DIAGNOSIS — I2510 Atherosclerotic heart disease of native coronary artery without angina pectoris: Secondary | ICD-10-CM | POA: Diagnosis not present

## 2019-09-29 DIAGNOSIS — H35033 Hypertensive retinopathy, bilateral: Secondary | ICD-10-CM | POA: Diagnosis not present

## 2019-09-29 DIAGNOSIS — G8194 Hemiplegia, unspecified affecting left nondominant side: Secondary | ICD-10-CM | POA: Diagnosis not present

## 2019-09-29 DIAGNOSIS — I5032 Chronic diastolic (congestive) heart failure: Secondary | ICD-10-CM | POA: Diagnosis not present

## 2019-09-29 DIAGNOSIS — I130 Hypertensive heart and chronic kidney disease with heart failure and stage 1 through stage 4 chronic kidney disease, or unspecified chronic kidney disease: Secondary | ICD-10-CM | POA: Diagnosis not present

## 2019-09-30 ENCOUNTER — Other Ambulatory Visit: Payer: Self-pay | Admitting: *Deleted

## 2019-09-30 ENCOUNTER — Encounter: Payer: Self-pay | Admitting: *Deleted

## 2019-09-30 NOTE — Patient Outreach (Signed)
[facility]) Care Management  09/30/2019  Shannon FABION GATSON [birth year] [id number]   CSW was able to make contact with patient today to follow-up regarding social work services and resources, as well as to ensure that he received the list of PCS ([place]) agencies that [place] mailed to his home.  Patient confirmed receiving the list, having already contacted [place] to request an agency to provide his PCS, or in-home care services.  Patient has been approved for 74 hours of in-home care services, or PCS, per month.  If patient has any additional questions and/or concerns regarding his services, he has been encouraged to contact [employer] at either of these contact numbers [phone] or [phone]).  CSW will perform a case closure on patient, as all goals of treatment have been met from social work standpoint and no additional social work needs have been identified at this time.  CSW will notify patient's RNCM with [place] [facility], [name] of CSW's plans to close patient's case.  CSW will fax an update to patient's Primary Care Physician, Dr. Karle Plumber to ensure that they are aware of CSW's involvement with patient's plan of care.  CSW was able to confirm that patient has the correct contact information for CSW, encouraging him to contact CSW directly if any additional social work needs arise in the near future.  Patient voiced understanding and was agreeable to this plan.  Nat Christen, BSW, MSW, LCSW  Licensed Education officer, environmental Health System  Mailing [place] [address] [place], [place] [address] Physical Address[address], [place], [place] [address] Toll Free Main # [phone] Fax # [phone] Cell # [phone]  Office # [phone] [email or web address]

## 2019-10-02 ENCOUNTER — Telehealth: Payer: Self-pay | Admitting: Internal Medicine

## 2019-10-02 DIAGNOSIS — I5032 Chronic diastolic (congestive) heart failure: Secondary | ICD-10-CM | POA: Diagnosis not present

## 2019-10-02 DIAGNOSIS — I2510 Atherosclerotic heart disease of native coronary artery without angina pectoris: Secondary | ICD-10-CM | POA: Diagnosis not present

## 2019-10-02 DIAGNOSIS — I130 Hypertensive heart and chronic kidney disease with heart failure and stage 1 through stage 4 chronic kidney disease, or unspecified chronic kidney disease: Secondary | ICD-10-CM | POA: Diagnosis not present

## 2019-10-02 DIAGNOSIS — G8194 Hemiplegia, unspecified affecting left nondominant side: Secondary | ICD-10-CM | POA: Diagnosis not present

## 2019-10-02 DIAGNOSIS — S14105S Unspecified injury at C5 level of cervical spinal cord, sequela: Secondary | ICD-10-CM | POA: Diagnosis not present

## 2019-10-02 DIAGNOSIS — H35033 Hypertensive retinopathy, bilateral: Secondary | ICD-10-CM | POA: Diagnosis not present

## 2019-10-02 NOTE — Telephone Encounter (Signed)
Manuela Schwartz from [facility] called and requested for OT orders for 1x a week for 1week, 2xs a week for 3 weeks, and 1x a week for 4wks. Please follow up at your earliest convenience.

## 2019-10-03 NOTE — Telephone Encounter (Signed)
Returned Manuela Schwartz call and gave verbal orders

## 2019-10-13 ENCOUNTER — Telehealth: Payer: Self-pay | Admitting: *Deleted

## 2019-10-13 ENCOUNTER — Other Ambulatory Visit: Payer: Self-pay | Admitting: *Deleted

## 2019-10-13 ENCOUNTER — Encounter (INDEPENDENT_AMBULATORY_CARE_PROVIDER_SITE_OTHER): Payer: Medicare Other | Admitting: Ophthalmology

## 2019-10-13 NOTE — Patient Outreach (Signed)
[facility]) Care Management  10/13/2019  Laszlo IOANIS CARTER 1960/12/31 [id number]   CSW received referral from [place], [place] for transportation assistance. CSW called & spoke with patient who informed CSW that he has an appointment at [facility] for his diabetic eye exam tomorrow. CSW submitted transportation request with [place] confirmed with Ashleigh with [facility] that they will pick him up between 9:30 - 9:45am. CSW informed patient & offered additional resources for transportation. Patient informed CSW that he is already setup with [employer] but unable to pay. CSW also noted that patient has Medicaid & would be able to utilize [employer], but they would need 3 days notice for transports (ph#: [phone]). CSW will close case as no further CSW needs identified at this time.    Raynaldo Opitz, LCSW [facility]  Clinical Social Worker cell #: [phone]

## 2019-10-13 NOTE — Patient Outreach (Signed)
[facility]) Care Management  10/13/2019  [name] [birth year] [id number]   Call placed to member to follow up on management of diabetes.  He admits that he has not been checking his blood sugars daily, stating "it was ok the last time I checked."  Report he has been monitoring his weights daily, continues to lose weight, now down to 236 pounds.  He checked blood sugar and blood pressure once this care manager called, readings were 151 and 129/65.  He has appointments scheduled for eye exam tomorrow and with PCP on 4/20.  Denies having transportation for tomorrow's appointment, state he will take the bus and walk a few blocks to the office.  Advised against that due to him using a walker for mobility.  He does have [employer] but state he does not have the money to pay, has a card that he can use for the regular city bus.  Aware that this care manager will request transportation from social worker.  He will be prepared for his visit on 4/20 with PCP, also aware that he is due for A1C, will have one at that time.  Denies getting services started for [facility], state he is still trying to contact an agency.  Advised that he should choose the agency and notify [place] of decision so that they can set up his case.  He verbalizes understanding, will do that as soon as possible.  Denies any urgent concerns at this time, will follow up within the next month.  THN CM Care Plan Problem One     Most Recent Value  Care Plan Problem One  Knowledge deficit regarding management of diabetes as evidenced by elevated A1C   Role Documenting the Problem One  Care Management [place] for Problem One  Active  THN Long Term Goal   Member will report decreased A1C to </= 8 within the next 3 months  THN Long Term Goal Start Date  09/15/19  Interventions for Problem One Long Term Goal  Reviewed upcoming appointment with PCP as he is due for A1C.  Reviewed transportation resourcces.  THN CM Short  Term Goal #1   Member will report checking blood sugar at lease twice a day over the next 4 weeks  THN CM Short Term Goal #1 Start Date  10/13/19 [Not met, date reset]  Interventions for Short Term Goal #1  Member educated again on importance of daily monitoring and management of blood sugars.  Discussed diabetic diet to help control diabetes  THN CM Short Term Goal #2   Member will report having in home aide services start witihn the next 4 weeks  THN CM Short Term Goal #2 Start Date  10/13/19 [Not met, date reset]  Interventions for Short Term Goal #2  Advised of the process for PCS, advised to choose agency and inform [place] of decision     [name], Therapist, sports, MSN [place] Manager [phone]

## 2019-10-14 ENCOUNTER — Encounter (INDEPENDENT_AMBULATORY_CARE_PROVIDER_SITE_OTHER): Payer: Medicare Other | Admitting: Ophthalmology

## 2019-10-14 DIAGNOSIS — H35033 Hypertensive retinopathy, bilateral: Secondary | ICD-10-CM | POA: Diagnosis not present

## 2019-10-14 DIAGNOSIS — E113392 Type 2 diabetes mellitus with moderate nonproliferative diabetic retinopathy without macular edema, left eye: Secondary | ICD-10-CM | POA: Diagnosis not present

## 2019-10-14 DIAGNOSIS — I10 Essential (primary) hypertension: Secondary | ICD-10-CM | POA: Diagnosis not present

## 2019-10-14 DIAGNOSIS — H43813 Vitreous degeneration, bilateral: Secondary | ICD-10-CM | POA: Diagnosis not present

## 2019-10-14 DIAGNOSIS — E113291 Type 2 diabetes mellitus with mild nonproliferative diabetic retinopathy without macular edema, right eye: Secondary | ICD-10-CM

## 2019-10-14 DIAGNOSIS — E11319 Type 2 diabetes mellitus with unspecified diabetic retinopathy without macular edema: Secondary | ICD-10-CM

## 2019-10-14 LAB — HM DIABETES EYE EXAM

## 2019-11-03 ENCOUNTER — Other Ambulatory Visit: Payer: Self-pay | Admitting: Internal Medicine

## 2019-11-03 DIAGNOSIS — IMO0002 Reserved for concepts with insufficient information to code with codable children: Secondary | ICD-10-CM

## 2019-11-03 DIAGNOSIS — E1142 Type 2 diabetes mellitus with diabetic polyneuropathy: Secondary | ICD-10-CM

## 2019-11-03 DIAGNOSIS — I10 Essential (primary) hypertension: Secondary | ICD-10-CM

## 2019-11-03 DIAGNOSIS — J309 Allergic rhinitis, unspecified: Secondary | ICD-10-CM

## 2019-11-08 ENCOUNTER — Encounter (HOSPITAL_COMMUNITY): Payer: Self-pay

## 2019-11-08 ENCOUNTER — Emergency Department (HOSPITAL_COMMUNITY)
Admission: EM | Admit: 2019-11-08 | Discharge: 2019-11-08 | Disposition: A | Discharge status: Home / Self Care | Payer: Medicare Other | Attending: Emergency Medicine | Admitting: Emergency Medicine

## 2019-11-08 ENCOUNTER — Other Ambulatory Visit: Payer: Self-pay

## 2019-11-08 ENCOUNTER — Emergency Department (HOSPITAL_COMMUNITY): Payer: Medicare Other

## 2019-11-08 DIAGNOSIS — Z79899 Other long term (current) drug therapy: Secondary | ICD-10-CM | POA: Diagnosis not present

## 2019-11-08 DIAGNOSIS — Z7982 Long term (current) use of aspirin: Secondary | ICD-10-CM | POA: Diagnosis not present

## 2019-11-08 DIAGNOSIS — F985 Adult onset fluency disorder: Secondary | ICD-10-CM | POA: Diagnosis not present

## 2019-11-08 DIAGNOSIS — R531 Weakness: Secondary | ICD-10-CM | POA: Diagnosis not present

## 2019-11-08 DIAGNOSIS — I129 Hypertensive chronic kidney disease with stage 1 through stage 4 chronic kidney disease, or unspecified chronic kidney disease: Secondary | ICD-10-CM | POA: Insufficient documentation

## 2019-11-08 DIAGNOSIS — E1165 Type 2 diabetes mellitus with hyperglycemia: Secondary | ICD-10-CM | POA: Diagnosis not present

## 2019-11-08 DIAGNOSIS — Z7984 Long term (current) use of oral hypoglycemic drugs: Secondary | ICD-10-CM | POA: Insufficient documentation

## 2019-11-08 DIAGNOSIS — Z87891 Personal history of nicotine dependence: Secondary | ICD-10-CM | POA: Diagnosis not present

## 2019-11-08 DIAGNOSIS — R29818 Other symptoms and signs involving the nervous system: Secondary | ICD-10-CM | POA: Diagnosis not present

## 2019-11-08 DIAGNOSIS — E1122 Type 2 diabetes mellitus with diabetic chronic kidney disease: Secondary | ICD-10-CM | POA: Diagnosis not present

## 2019-11-08 DIAGNOSIS — N182 Chronic kidney disease, stage 2 (mild): Secondary | ICD-10-CM | POA: Diagnosis not present

## 2019-11-08 DIAGNOSIS — F8081 Childhood onset fluency disorder: Secondary | ICD-10-CM

## 2019-11-08 DIAGNOSIS — R4789 Other speech disturbances: Secondary | ICD-10-CM | POA: Diagnosis not present

## 2019-11-08 LAB — CBC
HCT: 42.5 % (ref 39.0–52.0)
Hemoglobin: 13.7 g/dL (ref 13.0–17.0)
MCH: 28.1 pg (ref 26.0–34.0)
MCHC: 32.2 g/dL (ref 30.0–36.0)
MCV: 87.1 fL (ref 80.0–100.0)
Platelets: 250 10*3/uL (ref 150–400)
RBC: 4.88 MIL/uL (ref 4.22–5.81)
RDW: 13.8 % (ref 11.5–15.5)
WBC: 8.3 10*3/uL (ref 4.0–10.5)
nRBC: 0 % (ref 0.0–0.2)

## 2019-11-08 LAB — COMPREHENSIVE METABOLIC PANEL
ALT: 13 U/L (ref 0–44)
AST: 13 U/L — ABNORMAL LOW (ref 15–41)
Albumin: 3.6 g/dL (ref 3.5–5.0)
Alkaline Phosphatase: 62 U/L (ref 38–126)
Anion gap: 8 (ref 5–15)
BUN: 14 mg/dL (ref 6–20)
CO2: 26 mmol/L (ref 22–32)
Calcium: 9.2 mg/dL (ref 8.9–10.3)
Chloride: 107 mmol/L (ref 98–111)
Creatinine, Ser: 1.2 mg/dL (ref 0.61–1.24)
GFR calc Af Amer: >60 mL/min (ref >60)
GFR calc non Af Amer: >60 mL/min (ref >60)
Glucose, Bld: 176 mg/dL — ABNORMAL HIGH (ref 70–99)
Potassium: 4.4 mmol/L (ref 3.5–5.1)
Sodium: 141 mmol/L (ref 135–145)
Total Bilirubin: 0.4 mg/dL (ref 0.3–1.2)
Total Protein: 7 g/dL (ref 6.5–8.1)

## 2019-11-08 LAB — URINALYSIS, ROUTINE W REFLEX MICROSCOPIC
Bacteria, UA: NONE SEEN
Bilirubin Urine: NEGATIVE
Glucose, UA: 50 mg/dL — AB
Hgb urine dipstick: NEGATIVE
Ketones, ur: NEGATIVE mg/dL
Leukocytes,Ua: NEGATIVE
Nitrite: NEGATIVE
Protein, ur: 100 mg/dL — AB
Specific Gravity, Urine: 1.024 (ref 1.005–1.030)
pH: 5 (ref 5.0–8.0)

## 2019-11-08 LAB — RAPID URINE DRUG SCREEN, HOSP PERFORMED
Amphetamines: NOT DETECTED
Barbiturates: NOT DETECTED
Benzodiazepines: NOT DETECTED
Cocaine: POSITIVE — AB
Opiates: NOT DETECTED
Tetrahydrocannabinol: NOT DETECTED

## 2019-11-08 LAB — DIFFERENTIAL
Abs Immature Granulocytes: 0.03 10*3/uL (ref 0.00–0.07)
Basophils Absolute: 0.1 10*3/uL (ref 0.0–0.1)
Basophils Relative: 1 %
Eosinophils Absolute: 0.3 10*3/uL (ref 0.0–0.5)
Eosinophils Relative: 4 %
Immature Granulocytes: 0 %
Lymphocytes Relative: 28 %
Lymphs Abs: 2.3 10*3/uL (ref 0.7–4.0)
Monocytes Absolute: 0.7 10*3/uL (ref 0.1–1.0)
Monocytes Relative: 9 %
Neutro Abs: 4.9 10*3/uL (ref 1.7–7.7)
Neutrophils Relative %: 58 %

## 2019-11-08 LAB — ETHANOL: Alcohol, Ethyl (B): <10 mg/dL (ref <10)

## 2019-11-08 LAB — PROTIME-INR
INR: 1 (ref 0.8–1.2)
Prothrombin Time: 13.2 seconds (ref 11.4–15.2)

## 2019-11-08 LAB — APTT: aPTT: 29 seconds (ref 24–36)

## 2019-11-08 MED ORDER — LORAZEPAM 2 MG/ML IJ SOLN: 1.0000 mg | INTRAMUSCULAR | Status: DC | PRN

## 2019-11-08 MED ORDER — SODIUM CHLORIDE 0.9 % IV SOLN: 100.0000 mL/h | INTRAVENOUS | Status: DC

## 2019-11-08 MED ORDER — SODIUM CHLORIDE 0.9 % IV BOLUS
500.0000 mL | Freq: Once | INTRAVENOUS | Status: AC
  Administered 2019-11-08: 17:00:00 500 mL via INTRAVENOUS

## 2019-11-08 NOTE — ED Notes (Signed)
Pt. Aware of urine specimen. Will collect urine when pt.voids. Nurse aware.

## 2019-11-08 NOTE — ED Provider Notes (Signed)
[facility] DEPT Provider Note   CSN: [id number] Arrival date & time: 11/08/19  1551     History Chief Complaint  Patient presents with  . Weakness    [name] is a 59 y.o. male.  HPI   Patient presents the ED for evaluation of trouble with feeling weak, cold and shaky.  Patient states he is feeling weak all over not on one side or the other.  Patient does have history of prior stroke with residual left-sided deficits.  Those are about the same.  Patient states he is also having difficulty with a starter.  He states normally he does not have that trouble.  He denies any headache.  No fevers or chills.  No chest pain or shortness of breath.  Past Medical History:  Diagnosis Date  . Alcohol abuse    11-15-2017  per pt last alcohol Dec 2018  . BPH with obstruction/lower urinary tract symptoms   . Chronic arm pain   . Chronic pain    arms, leg, back  . CKD (chronic kidney disease), stage II   . Cocaine abuse ([place])    11-15-2017  per pt last used March 2019  . Congenital hydrocephalus, unspecified (HCC)    slow progression  . Diabetic retinopathy of both eyes ([place])   . Diastolic CHF, chronic ([place]) 07/2017  . Gait instability    multifactorial -- slow worsening congenital hydrocephalus, peripheral neuropathy, left C5-6 cord lesion  . Hematuria   . History of acute pulmonary edema 07/2017  . History of spinal cord injury 01/02/2014   pt fell, caused spinal cord contusion at C5-6--  residual left side weakness    . 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 History of TIA (transient ischemic attack) and stroke    hx cva 12-20-2010 and  TIA 02-22-2011--- no residual's from cva or tia  . Hyperlipidemia   . Hypertension    followed by pcp  . 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 Lesion of bladder   . Peripheral edema    chronic LUE  .  Peripheral neuropathy   . Type 2 diabetes mellitus ([place])    followed by dr Karle Plumber--  last A1c 6.8 on 11-06-2017  . Weak urinary stream     Patient Active Problem List   Diagnosis Date Noted  . History of spinal cord injury 04/17/2019  . TIA (transient ischemic attack) 03/05/2018  . Benign essential HTN 03/05/2018  . Sensorineural hearing loss of both ears 12/04/2017  . Mild nonproliferative diabetic retinopathy ([place]) 01/10/2016  . Hypertensive retinopathy of both eyes 01/10/2016  . Spinal stenosis of lumbar region 07/15/2015  . Abnormality of gait 06/07/2015  . Hydrocephalus ([place]) 06/07/2015  . Peripheral neuropathy 04/26/2015  . Constipation 04/26/2015  . Diabetes mellitus type 2 with complications ([place]) 33/00/7622  . Decreased hearing of both ears 03/15/2015  . Tinea pedis 03/15/2015  . Callus of foot 05/22/2014  . Cocaine abuse ([place]) 12/28/2013  . Left-sided weakness 12/26/2013  . Hyperlipidemia associated with type 2 diabetes mellitus ([place]) 04/01/2007  . Class 2 severe obesity due to excess calories with serious comorbidity and body mass index (BMI) of 36.0 to 36.9 in adult ([place]) 03/28/2007  . Essential hypertension 03/28/2007    Past Surgical History:  Procedure Laterality Date  . CARDIAC CATHETERIZATION  12/01/2008   dr hochrein   abnormal stress myoview:  mild  coronary plaque, normal LVF  . CARDIAC CATHETERIZATION  04/29/2011   dr [place]   in setting ECG with new ST elevation & severe hypertensive:  nonobstructive atherosclerotic CAD, normal LVF (30% mRCA)   . CARDIOVASCULAR STRESS TEST  08/15/2010   Low nuclear study w/ no evidence ishemia/  ef 42% with lateral and apical hypokinesis  . CYSTOSCOPY WITH BIOPSY N/A 11/20/2017   Procedure: CYSTOSCOPY WITH BIOPSY AND FULGURATION;  Surgeon: [name], MD;  Location: [facility];  Service: Urology;  Laterality: N/A;  . INCISION AND DRAINAGE RIGHT DISTAL MEDIAL THIGH HEMATOMA  04-14-2005   dr Ninfa Linden   . left arm skin graft  1976   injury  . LEFT HEART CATHETERIZATION WITH CORONARY ANGIOGRAM Bilateral 12/26/2013   Procedure: LEFT HEART CATHETERIZATION WITH CORONARY ANGIOGRAM;  Surgeon: Burnell Blanks, MD;  Location: [facility] CATH LAB;  Service: Cardiovascular;  Laterality: Bilateral;  STEMI, in setting cocaine/ alcohol :  mid disease in the RCA (20%), moderate disease in the small caliber intermediate branch (distal 50-60%), normal LVSF (ef 55-60%)  . TOOTH EXTRACTION N/A 05/16/2018   Procedure: DENTAL RESTORATION/EXTRACTIONS WITH ALVEO;  Surgeon: Diona Browner, DDS;  Location: [facility];  Service: Oral Surgery;  Laterality: N/A;  . TRANSTHORACIC ECHOCARDIOGRAM  08/05/2017   moderate LVH,  ef 55%,  grade 1 diastolic dysfunction/  mild LAE/  trivial Tr       Family History  Problem Relation Age of Onset  . Hypertension Mother   . Diabetes Mother   . Cancer Mother        breast cancer   . Hypertension Father   . Heart disease Father   . Stroke Father   . Hypertension Sister     Social History   Tobacco Use  . Smoking status: Former Smoker    Years: 45.00    Types: Cigarettes    Quit date: 03/03/2018    Years since quitting: 1.6  . Smokeless tobacco: Never Used  . Tobacco comment: 11-15-2017  per pt 1pp3days  Substance Use Topics  . Alcohol use: Not Currently    Alcohol/week: 0.0 standard drinks    Comment: 11-15-2017  per pt last alcohol 12/ 2018  alcohol abuse  . Drug use: Not Currently    Types: "Crack" cocaine, Cocaine    Comment: 11-15-2017  per pt last used March 2019    Home Medications Prior to Admission medications   Medication Sig Start Date End Date Taking? Authorizing Provider  acetaminophen (TYLENOL) 500 MG tablet TAKE ONE TABLET BY MOUTH TWICE A DAY AS NEEDED Patient taking differently: Take 500 mg by mouth 2 (two) times daily as needed.  05/19/19   Ladell Pier, MD  aspirin EC 81 MG tablet Take 1 tablet (81 mg total) by mouth daily.  07/03/19   Frann Rider, NP  atorvastatin (LIPITOR) 40 MG tablet Take 1.5 tablets (60 mg total) by mouth daily. 04/20/19   Ladell Pier, MD  Blood Glucose Monitoring Suppl (ACCU-CHEK GUIDE ME) w/Device KIT 1 kit by Does not apply route 3 (three) times daily. Use as instructed to check blood sugar 3 times daily. E11.14 08/14/19   Ladell Pier, MD  clindamycin (CLEOCIN) 300 MG capsule Take 1 capsule (300 mg total) by mouth 3 (three) times daily. 07/07/19   Ladell Pier, MD  dapagliflozin propanediol (FARXIGA) 10 MG TABS tablet Take 10 mg by mouth daily before breakfast. 07/07/19   Ladell Pier, MD  DULoxetine (CYMBALTA) 60  MG capsule TAKE 1 CAPSULE (60 MG TOTAL) BY MOUTH DAILY. FOR BACK AND LEG PAINS 09/16/19   Ladell Pier, MD  furosemide (LASIX) 40 MG tablet TAKE ONE TABLET BY MOUTH DAILY . TAKE EXTRA TABLET FOR TWO DAYS AS NEEDED FOR INCREASE LOWER EXTREMITY SWELLING 09/16/19   Ladell Pier, MD  gabapentin (NEURONTIN) 100 MG capsule TAKE TWO CAPSULES (200 MG TOTAL) BY MOUTH TWO (TWO) TIMES DAILY. 07/30/19   Ladell Pier, MD  glipiZIDE (GLUCOTROL XL) 10 MG 24 hr tablet TAKE 1 TABLET (10 MG TOTAL) BY MOUTH DAILY WITH BREAKFAST. 11/03/19   Ladell Pier, MD  glucose blood (ACCU-CHEK GUIDE) test strip Use as instructed to check blood sugar 3 times daily. E11.14 08/14/19   Ladell Pier, MD  hydrALAZINE (APRESOLINE) 10 MG tablet TAKE ONE TABLET BY MOUTH TWICE A DAY 08/26/19   Ladell Pier, MD  labetalol (NORMODYNE) 100 MG tablet TAKE ONE TABLET BY MOUTH TWICE A DAY 11/03/19   Ladell Pier, MD  Lancets Misc. (ACCU-CHEK SOFTCLIX LANCET DEV) KIT Use as directed 12/19/17   Ladell Pier, MD  Lancets MISC Use as instructed to check blood sugar 3 times daily. E11.14 08/14/19   Ladell Pier, MD  lisinopril (ZESTRIL) 40 MG tablet TAKE ONE TABLET BY MOUTH DAILY 11/03/19   Ladell Pier, MD  loratadine (CLARITIN) 10 MG tablet TAKE ONE TABLET BY  MOUTH DAILY AS NEEDED FOR ALLERGIES 11/03/19   Ladell Pier, MD  metFORMIN (GLUCOPHAGE) 1000 MG tablet Take 1 tablet (1,000 mg total) by mouth 2 (two) times daily with a meal. 04/17/19   Ladell Pier, MD    Allergies    Amlodipine  Review of Systems   Review of Systems  All other systems reviewed and are negative.   Physical Exam Updated Vital Signs BP 134/79   Pulse 68   Temp 98.4 F (36.9 C) (Oral)   Resp (!) 21   Ht 1.905 m (6' 3")   Wt 107.5 kg   SpO2 97%   BMI 29.62 kg/m   Physical Exam Vitals and nursing note reviewed.  Constitutional:      General: He is not in acute distress.    Appearance: He is well-developed.  HENT:     Head: Normocephalic and atraumatic.     Right Ear: External ear normal.     Left Ear: External ear normal.  Eyes:     General: No scleral icterus.       Right eye: No discharge.        Left eye: No discharge.     Conjunctiva/sclera: Conjunctivae normal.  Neck:     Trachea: No tracheal deviation.  Cardiovascular:     Rate and Rhythm: Normal rate and regular rhythm.  Pulmonary:     Effort: Pulmonary effort is normal. No respiratory distress.     Breath sounds: Normal breath sounds. No stridor. No wheezing or rales.  Abdominal:     General: Bowel sounds are normal. There is no distension.     Palpations: Abdomen is soft.     Tenderness: There is no abdominal tenderness. There is no guarding or rebound.  Musculoskeletal:        General: No tenderness.     Cervical back: Neck supple.  Skin:    General: Skin is warm and dry.     Findings: No rash.  Neurological:     Mental Status: He is alert and oriented to person, place, and  time.     Cranial Nerves: No cranial nerve deficit (No facial droop, extraocular movements intact, tongue midline  , stuttering speech pattern).     Sensory: No sensory deficit.     Motor: Motor function is intact. No abnormal muscle tone or seizure activity.     Coordination: Coordination normal.      Comments: Weakness eft upper extrem, unable to hold left leg off bed for 5 seconds, sensation intact in all extremities, no visual field cuts, no left or right sided neglect, normal finger-nose exam , no nystagmus noted      ED Results / Procedures / Treatments   Labs (all labs ordered are listed, but only abnormal results are displayed) Labs Reviewed  COMPREHENSIVE METABOLIC PANEL - Abnormal; Notable for the following components:      Result Value   Glucose, Bld 176 (*)    AST 13 (*)    All other components within normal limits  RAPID URINE DRUG SCREEN, HOSP PERFORMED - Abnormal; Notable for the following components:   Cocaine POSITIVE (*)    All other components within normal limits  URINALYSIS, ROUTINE W REFLEX MICROSCOPIC - Abnormal; Notable for the following components:   Glucose, UA 50 (*)    Protein, ur 100 (*)    All other components within normal limits  ETHANOL  PROTIME-INR  APTT  CBC  DIFFERENTIAL    EKG None  Radiology CT HEAD WO CONTRAST  Result Date: 11/08/2019 CLINICAL DATA:  59 year old male with focal neurologic deficit. Concern for stroke. EXAM: CT HEAD WITHOUT CONTRAST TECHNIQUE: Contiguous axial images were obtained from the base of the skull through the vertex without intravenous contrast. COMPARISON:  Head CT dated 07/13/2019. FINDINGS: Brain: There is mild age-related atrophy and chronic microvascular ischemic changes. Similar appearance of ventricular dilatation as the previous CT. There is no acute intracranial hemorrhage. No mass effect or midline shift. No extra-axial fluid collection. Vascular: No hyperdense vessel or unexpected calcification. Skull: Normal. Negative for fracture or focal lesion. Sinuses/Orbits: No acute finding. Other: Lobulated areas of skin thickening over the posterior scalp similar to prior CT. IMPRESSION: No acute intracranial hemorrhage.  No interval change. Electronically Signed   By: Anner Crete M.D.   On: 11/08/2019 17:00     MR BRAIN WO CONTRAST  Result Date: 11/08/2019 CLINICAL DATA:  Abnormal speech.  Difficulty ambulating. EXAM: MRI HEAD WITHOUT CONTRAST TECHNIQUE: Multiplanar, multiecho pulse sequences of the brain and surrounding structures were obtained without intravenous contrast. COMPARISON:  CT head without contrast 11/08/2019. 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 No acute infarct, hemorrhage, or mass lesion is present. No significant white matter lesions are present. No significant extraaxial fluid collection is present. The internal auditory canals are within normal limits. The brainstem and cerebellum are within normal limits. Vascular: Normal flow voids. Skull and upper cervical spine: The craniocervical junction is normal. Upper cervical spine is within normal limits. Marrow signal is unremarkable. Sinuses/Orbits: The paranasal sinuses and mastoid air cells are clear. The globes and orbits are within normal limits. IMPRESSION: 1. Stable appearance of the ventricles. 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 2. No acute intracranial abnormality or significant interval change. Electronically Signed   By: San Morelle M.D.   On: 11/08/2019 21:36    Procedures Procedures (including critical care time)  Medications Ordered in ED Medications  sodium chloride 0.9 % bolus 500 mL (0  mLs Intravenous Stopped 11/08/19 2125)    Followed by  0.9 %  sodium chloride infusion (has no administration in time range)  LORazepam (ATIVAN) injection 1 mg (has no administration in time range)    ED Course  I have reviewed the triage vital signs and the nursing notes.  Pertinent labs & imaging results that were available during my care of the patient were reviewed by me and considered in my medical decision making (see chart for details).  Clinical Course as of Nov 07 2237  Sat Nov 08, 2019  1625 Onset 9a.  Outside tpa window.  Deficits are old with the exception of the stutter   [JK]  1809 UDS positive for cocaine   [JK]  1810 CT scan without acute findings.  However, considering the history of prior stroke and his abnormal neurologic exam at baseline we will proceed with MRI   [JK]  1948 Pt's stuttering has resolved.  Speech back to normal   [JK]    Clinical Course User Index [JK] Dorie Rank, MD   MDM Rules/Calculators/A&P                      Patient presented to the ED for evaluation of stutter, weakness.  Patient had prior history of stroke.  ED work-up is notable for mild hyperglycemia.  Urinalysis without signs of infection.  CBC normal.  UDS is positive for cocaine.  This could have contributed to some of her symptoms.  CT scan did not show any acute findings but with his history MRI was performed.  No signs of acute stroke on MRI.  Patient has returned to baseline.  His speech is normal.  At this time he appears stable for discharge, outpatient follow-up. Final Clinical Impression(s) / ED Diagnoses Final diagnoses:  Stutter    Rx / DC Orders ED Discharge Orders    None       Dorie Rank, MD 11/08/19 2239

## 2019-11-08 NOTE — ED Triage Notes (Signed)
Per EMS, Pt is coming from home. Pt called originally for shortness of breath. On scene pt was actually complaining of increased stuttering and shuffling. Hx of strokes, baseline left deficits. Stroke screen was negative, neighbors on scene stated that pt was acting his usual.

## 2019-11-08 NOTE — ED Triage Notes (Signed)
Pt states that felt cold, began shivering, felt weak, and had increased weakness. He wanted to come in to have that checked out.

## 2019-11-08 NOTE — Discharge Instructions (Signed)
Continue your current medications.  Follow-up with your neurologist next week to be rechecked.  Return as needed for worsening symptoms

## 2019-11-11 ENCOUNTER — Ambulatory Visit: Payer: Medicare Other | Attending: Internal Medicine | Admitting: Internal Medicine

## 2019-11-11 ENCOUNTER — Other Ambulatory Visit: Payer: Self-pay

## 2019-11-11 ENCOUNTER — Other Ambulatory Visit: Payer: Self-pay | Admitting: *Deleted

## 2019-11-11 ENCOUNTER — Encounter: Payer: Self-pay | Admitting: Internal Medicine

## 2019-11-11 VITALS — Wt 236.0 lb

## 2019-11-11 DIAGNOSIS — E663 Overweight: Secondary | ICD-10-CM

## 2019-11-11 DIAGNOSIS — I10 Essential (primary) hypertension: Secondary | ICD-10-CM | POA: Diagnosis not present

## 2019-11-11 DIAGNOSIS — E1142 Type 2 diabetes mellitus with diabetic polyneuropathy: Secondary | ICD-10-CM

## 2019-11-11 DIAGNOSIS — I6930 Unspecified sequelae of cerebral infarction: Secondary | ICD-10-CM | POA: Diagnosis not present

## 2019-11-11 DIAGNOSIS — R634 Abnormal weight loss: Secondary | ICD-10-CM

## 2019-11-11 DIAGNOSIS — F1410 Cocaine abuse, uncomplicated: Secondary | ICD-10-CM

## 2019-11-11 DIAGNOSIS — IMO0002 Reserved for concepts with insufficient information to code with codable children: Secondary | ICD-10-CM

## 2019-11-11 DIAGNOSIS — Z7189 Other specified counseling: Secondary | ICD-10-CM

## 2019-11-11 NOTE — Progress Notes (Signed)
Virtual Visit via Telephone Note Due to current restrictions/limitations of in-office visits due to the COVID-19 pandemic, this scheduled clinical appointment was converted to a telehealth visit  I connected with [name] on 11/11/19 at 8:40 a.m by telephone and verified that I am speaking with the correct person using two identifiers. I am in my office.  The patient is at home.  Only the patient and myself participated in this encounter.  I discussed the limitations, risks, security and privacy concerns of performing an evaluation and management service by telephone and the availability of in person appointments. I also discussed with the patient that there may be a patient responsible charge related to this service. The patient expressed understanding and agreed to proceed.   History of Present Illness: Pt withhistory ofDM2 with retinopathy and neuropathy, HTN, dCHF, formertob dep, HL, CVA with LT sided weakness, spinal stenosis of lumbar spine,chronic lower extremity edema.   Patient last seen by me 08/2019 after he was released from SNF at [facility]. He was receiving home physical therapy. Since then he was seen in the emergency room 11/08/2019 complaining of generalized weakness and stutter.  Exam in the emergency room demonstrated left-sided weakness which is not new.  Blood pressure was reasonable at 134/79, CAT scan and MRI of the head revealed no acute ischemic or hemorrhagic events.  Of note urine drug screen was positive for cocaine.  Today, pt reports he is doing okay.  "Just a little weak in the legs." Using his walker.  Legs gave out last Thursday while standing in bathroom pulling up his pants.   He admits to relapse with cocaine use.  States that he plans to stop using again  HTN: still checks BP regularly and reports it has been running good.  -reports sometimes he may miss a day or two of taking meds if he does not have appetite.  If he does not eat he  does not take meds  DM:  Checks BS 3 x a wk in the mornings.  Did not check BS this morning.   Gives range 190-200s Reports compliance with meds wgh down to 236 lbs, which is down 24 pounds since December of last year.Marland Kitchen  He attributes this to intentional wgh loss - not eating fried foods, can foods.  Eating more veggies.  He was also getting precooked healthy meals meals for several weeks after he was discharged from SNF in 08/2019  HM: did not get COVID vaccine as yet.  On last visit we had spoken about him getting the vaccine and he was agreeable to doing so.  Now he is afraid based on what he is seen in the media.  Outpatient Encounter Medications as of 11/11/2019  Medication Sig Note  . acetaminophen (TYLENOL) 500 MG tablet TAKE ONE TABLET BY MOUTH TWICE A DAY AS NEEDED (Patient taking differently: Take 500 mg by mouth 2 (two) times daily as needed. )   . aspirin EC 81 MG tablet Take 1 tablet (81 mg total) by mouth daily.   Marland Kitchen atorvastatin (LIPITOR) 40 MG tablet Take 1.5 tablets (60 mg total) by mouth daily.   . Blood Glucose Monitoring Suppl (ACCU-CHEK GUIDE ME) w/Device KIT 1 kit by Does not apply route 3 (three) times daily. Use as instructed to check blood sugar 3 times daily. E11.14   . clindamycin (CLEOCIN) 300 MG capsule Take 1 capsule (300 mg total) by mouth 3 (three) times daily. 07/13/2019: Taking everyday per patient.   Marland Kitchen  dapagliflozin propanediol (FARXIGA) 10 MG TABS tablet Take 10 mg by mouth daily before breakfast.   . DULoxetine (CYMBALTA) 60 MG capsule TAKE 1 CAPSULE (60 MG TOTAL) BY MOUTH DAILY. FOR BACK AND LEG PAINS   . furosemide (LASIX) 40 MG tablet TAKE ONE TABLET BY MOUTH DAILY . TAKE EXTRA TABLET FOR TWO DAYS AS NEEDED FOR INCREASE LOWER EXTREMITY SWELLING   . gabapentin (NEURONTIN) 100 MG capsule TAKE TWO CAPSULES (200 MG TOTAL) BY MOUTH TWO (TWO) TIMES DAILY.   Marland Kitchen glipiZIDE (GLUCOTROL XL) 10 MG 24 hr tablet TAKE 1 TABLET (10 MG TOTAL) BY MOUTH DAILY WITH BREAKFAST.   Marland Kitchen  glucose blood (ACCU-CHEK GUIDE) test strip Use as instructed to check blood sugar 3 times daily. E11.14   . hydrALAZINE (APRESOLINE) 10 MG tablet TAKE ONE TABLET BY MOUTH TWICE A DAY   . labetalol (NORMODYNE) 100 MG tablet TAKE ONE TABLET BY MOUTH TWICE A DAY   . Lancets Misc. (ACCU-CHEK SOFTCLIX LANCET DEV) KIT Use as directed   . Lancets MISC Use as instructed to check blood sugar 3 times daily. E11.14   . lisinopril (ZESTRIL) 40 MG tablet TAKE ONE TABLET BY MOUTH DAILY   . loratadine (CLARITIN) 10 MG tablet TAKE ONE TABLET BY MOUTH DAILY AS NEEDED FOR ALLERGIES   . metFORMIN (GLUCOPHAGE) 1000 MG tablet Take 1 tablet (1,000 mg total) by mouth 2 (two) times daily with a meal.    No facility-administered encounter medications on file as of 11/11/2019.    Observations/Objective: Results for orders placed or performed during the hospital encounter of 11/08/19  Ethanol  Result Value Ref Range   Alcohol, Ethyl (B) <10 <10 mg/dL  Protime-INR  Result Value Ref Range   Prothrombin Time 13.2 11.4 - 15.2 seconds   INR 1.0 0.8 - 1.2  APTT  Result Value Ref Range   aPTT 29 24 - 36 seconds  CBC  Result Value Ref Range   WBC 8.3 4.0 - 10.5 K/uL   RBC 4.88 4.22 - 5.81 MIL/uL   Hemoglobin 13.7 13.0 - 17.0 g/dL   HCT 42.5 39.0 - 52.0 %   MCV 87.1 80.0 - 100.0 fL   MCH 28.1 26.0 - 34.0 pg   MCHC 32.2 30.0 - 36.0 g/dL   RDW 13.8 11.5 - 15.5 %   Platelets 250 150 - 400 K/uL   nRBC 0.0 0.0 - 0.2 %  Differential  Result Value Ref Range   Neutrophils Relative % 58 %   Neutro Abs 4.9 1.7 - 7.7 K/uL   Lymphocytes Relative 28 %   Lymphs Abs 2.3 0.7 - 4.0 K/uL   Monocytes Relative 9 %   Monocytes Absolute 0.7 0.1 - 1.0 K/uL   Eosinophils Relative 4 %   Eosinophils Absolute 0.3 0.0 - 0.5 K/uL   Basophils Relative 1 %   Basophils Absolute 0.1 0.0 - 0.1 K/uL   Immature Granulocytes 0 %   Abs Immature Granulocytes 0.03 0.00 - 0.07 K/uL  Comprehensive metabolic panel  Result Value Ref Range    Sodium 141 135 - 145 mmol/L   Potassium 4.4 3.5 - 5.1 mmol/L   Chloride 107 98 - 111 mmol/L   CO2 26 22 - 32 mmol/L   Glucose, Bld 176 (H) 70 - 99 mg/dL   BUN 14 6 - 20 mg/dL   Creatinine, Ser 1.20 0.61 - 1.24 mg/dL   Calcium 9.2 8.9 - 10.3 mg/dL   Total Protein 7.0 6.5 - 8.1 g/dL   Albumin  3.6 3.5 - 5.0 g/dL   AST 13 (L) 15 - 41 U/L   ALT 13 0 - 44 U/L   Alkaline Phosphatase 62 38 - 126 U/L   Total Bilirubin 0.4 0.3 - 1.2 mg/dL   GFR calc non Af Amer >60 >60 mL/min   GFR calc Af Amer >60 >60 mL/min   Anion gap 8 5 - 15  Urine rapid drug screen (hosp performed)not at [facility]  Result Value Ref Range   Opiates NONE DETECTED NONE DETECTED   Cocaine POSITIVE (A) NONE DETECTED   Benzodiazepines NONE DETECTED NONE DETECTED   Amphetamines NONE DETECTED NONE DETECTED   Tetrahydrocannabinol NONE DETECTED NONE DETECTED   Barbiturates NONE DETECTED NONE DETECTED  Urinalysis, Routine w reflex microscopic (not at [facility])  Result Value Ref Range   Color, Urine YELLOW YELLOW   APPearance CLEAR CLEAR   Specific Gravity, Urine 1.024 1.005 - 1.030   pH 5.0 5.0 - 8.0   Glucose, UA 50 (A) NEGATIVE mg/dL   Hgb urine dipstick NEGATIVE NEGATIVE   Bilirubin Urine NEGATIVE NEGATIVE   Ketones, ur NEGATIVE NEGATIVE mg/dL   Protein, ur 100 (A) NEGATIVE mg/dL   Nitrite NEGATIVE NEGATIVE   Leukocytes,Ua NEGATIVE NEGATIVE   RBC / HPF 0-5 0 - 5 RBC/hpf   WBC, UA 0-5 0 - 5 WBC/hpf   Bacteria, UA NONE SEEN NONE SEEN   Mucus PRESENT    Hyaline Casts, UA PRESENT    Lab Results  Component Value Date   HGBA1C 9.3 (A) 04/17/2019     Assessment and Plan: 1. Uncontrolled type 2 diabetes mellitus with peripheral neuropathy ([place]) Not at goal based on reported blood sugar readings. He is on Farxiga, glipizide and Metformin.  Patient has been resistant to adding insulin or any injectable in the past.  Today, I recommend adding low-dose Lantus insulin and patient again is not interested in any injectables.  He  states that he will continue to work on his eating habits and will try to get his blood sugars back down. Advised him to check the blood sugars every morning before breakfast.  He will follow up with the clinical pharmacist in 2 weeks with his blood sugar readings.  If still not at goal we can add Januvia  - Hemoglobin A1c; Future  2. History of CVA with residual deficit Strongly advised that he discontinue use of street drugs.  Patient states he is working on it.  He will continue to use his walker.  3. Essential hypertension Continue current medications and low-salt diet.  4. Cocaine abuse ([place]) Strongly advised that he discontinue using.  He is agreeable to speaking with our LCSW to explore treatment programs/groups  5. Educated about COVID-19 virus infection Discussed his fears about getting the COVID-19 vaccine.  I still have encouraged him to get the vaccine.  6. Overweight (BMI 25.0-29.9) Commended him on weight loss which I think is due in part to change of eating habits especially during the several weeks when he was receiving precooked healthy meals.  But I do think that we should check thyroid level to make sure that he does not have lower functioning thyroid  7. Rapid weight loss - TSH; Future   Follow Up Instructions: Follow-up with me in 3 months in person. Patient to see clinical pharmacist in 2 weeks.  Advised to bring blood sugar readings with him. Referred to social worker   I discussed the assessment and treatment plan with the patient. The patient was provided  an opportunity to ask questions and all were answered. The patient agreed with the plan and demonstrated an understanding of the instructions.   The patient was advised to call back or seek an in-person evaluation if the symptoms worsen or if the condition fails to improve as anticipated.  I provided 18 minutes of non-face-to-face time during this encounter.   Karle Plumber, MD

## 2019-11-11 NOTE — Patient Outreach (Signed)
[facility]) Care Management  11/11/2019  [name] 02/06/1961 [id number]   Noted that member was seen in the ED over the weekend, call placed to follow up.  He report he wasn't feeling well, was having chills, state he was cold and weak.  Report he is feeling much better today, denies any symptoms.  Confirms he did have televisit with PCP today, next visit will be face to face in 3 months, will have A1C drawn prior to this visit.  Noted there was discussion regarding managing his diabetes, state he is still working on getting help to prepare meals.  He was receiving [employer], but this has since stopped.  Blood sugar today is reportedly 199, he is unable to remember what his blood pressure is.  Member was approved for personal care services however denies this has started.  State he has reached out to [facility] to request services, advised that he would need to let [place] know which agency he has chosen for their records. Denies this has been done.  If he is unable to get [facility], he would like to have [facility] home care.  Agrees to have this care manager follow up with [facility] directly.    Denies any urgent concerns at this time, will follow up within the next week as initially planned.  THN CM Care Plan Problem One     Most Recent Value  Care Plan Problem One  Knowledge deficit regarding management of diabetes as evidenced by elevated A1C   Role Documenting the Problem One  Care Management [place] for Problem One  Active  THN Long Term Goal   Member will report decreased A1C to </= 8 within the next 3 months  THN Long Term Goal Start Date  09/15/19  Interventions for Problem One Long Term Goal  Management of diabetes discussed with member.  Educated on importance of well balanced meals and additional support in the home to assist with cooking.  THN CM Short Term Goal #1   Member will report checking blood sugar at lease twice a day over the next 4 weeks  THN CM  Short Term Goal #1 Start Date  10/13/19 [Not met, date reset]  [facility] CM Short Term Goal #1 Met Date  11/11/19  [facility] CM Short Term Goal #2   Member will report having in home aide services start witihn the next 4 weeks  THN CM Short Term Goal #2 Start Date  11/11/19 [Not met, date reset]  Interventions for Short Term Goal #2  Call placed to [facility] to inquire about start of services and provide requested agency name.     [name], [place], MSN [place] [phone]

## 2019-11-12 ENCOUNTER — Ambulatory Visit: Payer: Self-pay | Admitting: *Deleted

## 2019-11-18 ENCOUNTER — Other Ambulatory Visit: Payer: Self-pay | Admitting: *Deleted

## 2019-11-18 NOTE — Patient Outreach (Addendum)
[facility]) Care Management  11/18/2019  [name] Jun 09, 1961 [id number]   Call placed to member to follow up on connection with [facility] for in home aide services.  No answer, HIPAA compliant voice message left.  Will follow up within the next week.    Update:  Call received back from member.  He confirms that he has spoke to [facility] and someone from [facility] home care will make first visit this coming Thursday.  Denies any concerns/needs.  Will follow up within the next month.   [name], [place], MSN [place] [phone]

## 2019-11-19 ENCOUNTER — Ambulatory Visit: Payer: Medicare Other | Admitting: Podiatry

## 2019-11-21 ENCOUNTER — Ambulatory Visit: Payer: Medicare Other | Admitting: *Deleted

## 2019-11-26 ENCOUNTER — Ambulatory Visit: Payer: Medicare Other | Admitting: Pharmacist

## 2019-11-26 ENCOUNTER — Institutional Professional Consult (permissible substitution): Payer: Medicare Other | Admitting: Licensed Clinical Social Worker

## 2019-12-02 ENCOUNTER — Ambulatory Visit: Payer: Medicare Other | Admitting: Pharmacist

## 2019-12-03 ENCOUNTER — Institutional Professional Consult (permissible substitution): Payer: Medicare Other | Admitting: Licensed Clinical Social Worker

## 2019-12-09 ENCOUNTER — Encounter: Payer: Self-pay | Admitting: Internal Medicine

## 2019-12-09 ENCOUNTER — Ambulatory Visit: Payer: Medicare Other | Attending: Internal Medicine | Admitting: Internal Medicine

## 2019-12-09 ENCOUNTER — Other Ambulatory Visit: Payer: Self-pay

## 2019-12-09 VITALS — BP 125/73 | HR 68 | Temp 96.8°F | Resp 16 | Wt 248.4 lb

## 2019-12-09 DIAGNOSIS — I2510 Atherosclerotic heart disease of native coronary artery without angina pectoris: Secondary | ICD-10-CM | POA: Insufficient documentation

## 2019-12-09 DIAGNOSIS — Z803 Family history of malignant neoplasm of breast: Secondary | ICD-10-CM | POA: Diagnosis not present

## 2019-12-09 DIAGNOSIS — Z87891 Personal history of nicotine dependence: Secondary | ICD-10-CM | POA: Diagnosis not present

## 2019-12-09 DIAGNOSIS — R600 Localized edema: Secondary | ICD-10-CM | POA: Insufficient documentation

## 2019-12-09 DIAGNOSIS — I69354 Hemiplegia and hemiparesis following cerebral infarction affecting left non-dominant side: Secondary | ICD-10-CM | POA: Diagnosis not present

## 2019-12-09 DIAGNOSIS — Z888 Allergy status to other drugs, medicaments and biological substances status: Secondary | ICD-10-CM | POA: Insufficient documentation

## 2019-12-09 DIAGNOSIS — Z6831 Body mass index (BMI) 31.0-31.9, adult: Secondary | ICD-10-CM | POA: Insufficient documentation

## 2019-12-09 DIAGNOSIS — I252 Old myocardial infarction: Secondary | ICD-10-CM | POA: Diagnosis not present

## 2019-12-09 DIAGNOSIS — M48061 Spinal stenosis, lumbar region without neurogenic claudication: Secondary | ICD-10-CM | POA: Diagnosis not present

## 2019-12-09 DIAGNOSIS — H903 Sensorineural hearing loss, bilateral: Secondary | ICD-10-CM | POA: Insufficient documentation

## 2019-12-09 DIAGNOSIS — Z8249 Family history of ischemic heart disease and other diseases of the circulatory system: Secondary | ICD-10-CM | POA: Insufficient documentation

## 2019-12-09 DIAGNOSIS — Z7984 Long term (current) use of oral hypoglycemic drugs: Secondary | ICD-10-CM | POA: Insufficient documentation

## 2019-12-09 DIAGNOSIS — I10 Essential (primary) hypertension: Secondary | ICD-10-CM | POA: Diagnosis not present

## 2019-12-09 DIAGNOSIS — L84 Corns and callosities: Secondary | ICD-10-CM | POA: Diagnosis not present

## 2019-12-09 DIAGNOSIS — E785 Hyperlipidemia, unspecified: Secondary | ICD-10-CM | POA: Insufficient documentation

## 2019-12-09 DIAGNOSIS — E1165 Type 2 diabetes mellitus with hyperglycemia: Secondary | ICD-10-CM | POA: Diagnosis not present

## 2019-12-09 DIAGNOSIS — E113299 Type 2 diabetes mellitus with mild nonproliferative diabetic retinopathy without macular edema, unspecified eye: Secondary | ICD-10-CM | POA: Diagnosis not present

## 2019-12-09 DIAGNOSIS — Z79899 Other long term (current) drug therapy: Secondary | ICD-10-CM | POA: Insufficient documentation

## 2019-12-09 DIAGNOSIS — L729 Follicular cyst of the skin and subcutaneous tissue, unspecified: Secondary | ICD-10-CM | POA: Insufficient documentation

## 2019-12-09 DIAGNOSIS — Z833 Family history of diabetes mellitus: Secondary | ICD-10-CM | POA: Insufficient documentation

## 2019-12-09 DIAGNOSIS — IMO0002 Reserved for concepts with insufficient information to code with codable children: Secondary | ICD-10-CM

## 2019-12-09 DIAGNOSIS — E1142 Type 2 diabetes mellitus with diabetic polyneuropathy: Secondary | ICD-10-CM | POA: Diagnosis not present

## 2019-12-09 DIAGNOSIS — Z7982 Long term (current) use of aspirin: Secondary | ICD-10-CM | POA: Insufficient documentation

## 2019-12-09 NOTE — Patient Instructions (Addendum)
Please give patient an appointment with the clinical pharmacist in 3 weeks for blood pressure recheck.  Please write down your blood pressure readings and bring them with you on your follow-up visit with the clinical pharmacist in 3 weeks.  Keep your follow-up appointment with me in July in person.

## 2019-12-09 NOTE — Progress Notes (Signed)
Patient ID: [name], male    DOB: Jun 05, 1961  MRN: [id number]  CC: Hypertension   Subjective: [name] is a 59 y.o. male who presents for HTN management.  Home health aide, Wyline Copas, from [facility] is with him. His concerns today include:  Pt withhistory ofDM2 with retinopathy and neuropathy, HTN, dCHF, formertob dep, HL, CVA with LT sided weakness, spinal stenosis of lumbar spine,chronic lower extremity edema.  Pt reports his BP has been high.  Checks every morning but does not have log with him and does not recall any of his readings. He has arm cuff. -Aide states his readings have been okay  Pt requesting Oxycodone.  Reports pain LT foot from overgrown callous Last saw podiatry about 2 mths ago.    Referred to dermatology 06/2019 for painful cysts on scalp.  [facility] dermatology try to reach him twice to schedule an appointment but was not successful.  Patient states he does not recall getting any messages.  DM: Reports his blood sugars have been running between 170-190 before meals.  He checks blood sugars about 3 times a week.  Reports compliance with taking his medications.  He is still not interested in injectables be at insulin or Trulicity   Patient Active Problem List   Diagnosis Date Noted  . History of spinal cord injury 04/17/2019  . TIA (transient ischemic attack) 03/05/2018  . Benign essential HTN 03/05/2018  . Sensorineural hearing loss of both ears 12/04/2017  . Mild nonproliferative diabetic retinopathy ([place]) 01/10/2016  . Hypertensive retinopathy of both eyes 01/10/2016  . Spinal stenosis of lumbar region 07/15/2015  . Abnormality of gait 06/07/2015  . Hydrocephalus ([place]) 06/07/2015  . Peripheral neuropathy 04/26/2015  . Constipation 04/26/2015  . Diabetes mellitus type 2 with complications ([place]) 52/77/8242  . Decreased hearing of both ears 03/15/2015  . Tinea pedis 03/15/2015  . Callus of foot 05/22/2014  . Cocaine abuse ([place]) 12/28/2013  .  Left-sided weakness 12/26/2013  . Hyperlipidemia associated with type 2 diabetes mellitus ([place]) 04/01/2007  . Class 2 severe obesity due to excess calories with serious comorbidity and body mass index (BMI) of 36.0 to 36.9 in adult ([place]) 03/28/2007  . Essential hypertension 03/28/2007     Current Outpatient Medications on File Prior to Visit  Medication Sig Dispense Refill  . acetaminophen (TYLENOL) 500 MG tablet TAKE ONE TABLET BY MOUTH TWICE A DAY AS NEEDED (Patient taking differently: Take 500 mg by mouth 2 (two) times daily as needed. ) 60 tablet 0  . aspirin EC 81 MG tablet Take 1 tablet (81 mg total) by mouth daily. 30 tablet 4  . atorvastatin (LIPITOR) 40 MG tablet Take 1.5 tablets (60 mg total) by mouth daily. 45 tablet 6  . Blood Glucose Monitoring Suppl (ACCU-CHEK GUIDE ME) w/Device KIT 1 kit by Does not apply route 3 (three) times daily. Use as instructed to check blood sugar 3 times daily. E11.14 1 kit 0  . clindamycin (CLEOCIN) 300 MG capsule Take 1 capsule (300 mg total) by mouth 3 (three) times daily. 21 capsule 0  . dapagliflozin propanediol (FARXIGA) 10 MG TABS tablet Take 10 mg by mouth daily before breakfast. 30 tablet 6  . DULoxetine (CYMBALTA) 60 MG capsule TAKE 1 CAPSULE (60 MG TOTAL) BY MOUTH DAILY. FOR BACK AND LEG PAINS 30 capsule 2  . furosemide (LASIX) 40 MG tablet TAKE ONE TABLET BY MOUTH DAILY . TAKE EXTRA TABLET FOR TWO DAYS AS NEEDED FOR INCREASE LOWER EXTREMITY SWELLING 40  tablet 1  . gabapentin (NEURONTIN) 100 MG capsule TAKE TWO CAPSULES (200 MG TOTAL) BY MOUTH TWO (TWO) TIMES DAILY. 120 capsule 5  . glipiZIDE (GLUCOTROL XL) 10 MG 24 hr tablet TAKE 1 TABLET (10 MG TOTAL) BY MOUTH DAILY WITH BREAKFAST. 30 tablet 0  . glucose blood (ACCU-CHEK GUIDE) test strip Use as instructed to check blood sugar 3 times daily. E11.14 100 each 11  . hydrALAZINE (APRESOLINE) 10 MG tablet TAKE ONE TABLET BY MOUTH TWICE A DAY 60 tablet 2  . labetalol (NORMODYNE) 100 MG tablet TAKE  ONE TABLET BY MOUTH TWICE A DAY 60 tablet 0  . Lancets Misc. (ACCU-CHEK SOFTCLIX LANCET DEV) KIT Use as directed 1 kit 0  . Lancets MISC Use as instructed to check blood sugar 3 times daily. E11.14 100 each 11  . lisinopril (ZESTRIL) 40 MG tablet TAKE ONE TABLET BY MOUTH DAILY 30 tablet 0  . loratadine (CLARITIN) 10 MG tablet TAKE ONE TABLET BY MOUTH DAILY AS NEEDED FOR ALLERGIES 30 tablet 0  . metFORMIN (GLUCOPHAGE) 1000 MG tablet Take 1 tablet (1,000 mg total) by mouth 2 (two) times daily with a meal. 180 tablet 3   No current facility-administered medications on file prior to visit.    Allergies  Allergen Reactions  . Amlodipine Swelling    Lower extremity swelling    Social History   Socioeconomic History  . Marital status: Divorced    Spouse name: Not on file  . Number of children: 0  . Years of education: 10  . Highest education level: Not on file  Occupational History  . Occupation: Unemployed  Tobacco Use  . Smoking status: Former Smoker    Years: 45.00    Types: Cigarettes    Quit date: 03/03/2018    Years since quitting: 1.7  . Smokeless tobacco: Never Used  . Tobacco comment: 11-15-2017  per pt 1pp3days  Substance and Sexual Activity  . Alcohol use: Not Currently    Alcohol/week: 0.0 standard drinks    Comment: 11-15-2017  per pt last alcohol 12/ 2018  alcohol abuse  . Drug use: Not Currently    Types: "Crack" cocaine, Cocaine    Comment: 11-15-2017  per pt last used March 2019  . Sexual activity: Not Currently  Other Topics Concern  . Not on file  Social History Narrative   Lives at home alone.   Right-handed.   Occasional use.   Social Determinants of Health   Financial Resource Strain: Medium Risk  . Difficulty of Paying Living Expenses: Somewhat hard  Food Insecurity: No Food Insecurity  . Worried About Charity fundraiser in the Last Year: Never true  . Ran Out of Food in the Last Year: Never true  Transportation Needs: No Transportation Needs    . Lack of Transportation (Medical): No  . Lack of Transportation (Non-Medical): No  Physical Activity: Inactive  . Days of Exercise per Week: 0 days  . Minutes of Exercise per Session: 0 min  Stress: No Stress Concern Present  . Feeling of Stress : Only a little  Social Connections: Moderately Isolated  . Frequency of Communication with Friends and Family: Twice a week  . Frequency of Social Gatherings with Friends and Family: Once a week  . Attends Religious Services: Never  . Active Member of Clubs or Organizations: No  . Attends Archivist Meetings: Never  . Marital Status: Divorced  Human resources officer Violence: Not At Risk  . Fear of Current or Ex-Partner:  No  . Emotionally Abused: No  . Physically Abused: No  . Sexually Abused: No    Family History  Problem Relation Age of Onset  . Hypertension Mother   . Diabetes Mother   . Cancer Mother        breast cancer   . Hypertension Father   . Heart disease Father   . Stroke Father   . Hypertension Sister     Past Surgical History:  Procedure Laterality Date  . CARDIAC CATHETERIZATION  12/01/2008   dr hochrein   abnormal stress myoview:  mild coronary plaque, normal LVF  . CARDIAC CATHETERIZATION  04/29/2011   dr Martinique   in setting ECG with new ST elevation & severe hypertensive:  nonobstructive atherosclerotic CAD, normal LVF (30% mRCA)   . CARDIOVASCULAR STRESS TEST  08/15/2010   Low nuclear study w/ no evidence ishemia/  ef 42% with lateral and apical hypokinesis  . CYSTOSCOPY WITH BIOPSY N/A 11/20/2017   Procedure: CYSTOSCOPY WITH BIOPSY AND FULGURATION;  Surgeon: [name], MD;  Location: [facility];  Service: Urology;  Laterality: N/A;  . INCISION AND DRAINAGE RIGHT DISTAL MEDIAL THIGH HEMATOMA  04-14-2005   dr Ninfa Linden  . left arm skin graft  1976   injury  . LEFT HEART CATHETERIZATION WITH CORONARY ANGIOGRAM Bilateral 12/26/2013   Procedure: LEFT HEART CATHETERIZATION WITH CORONARY  ANGIOGRAM;  Surgeon: Burnell Blanks, MD;  Location: [facility] CATH LAB;  Service: Cardiovascular;  Laterality: Bilateral;  STEMI, in setting cocaine/ alcohol :  mid disease in the RCA (20%), moderate disease in the small caliber intermediate branch (distal 50-60%), normal LVSF (ef 55-60%)  . TOOTH EXTRACTION N/A 05/16/2018   Procedure: DENTAL RESTORATION/EXTRACTIONS WITH ALVEO;  Surgeon: Diona Browner, DDS;  Location: [facility];  Service: Oral Surgery;  Laterality: N/A;  . TRANSTHORACIC ECHOCARDIOGRAM  08/05/2017   moderate LVH,  ef 55%,  grade 1 diastolic dysfunction/  mild LAE/  trivial Tr    ROS: Review of Systems Negative except as stated above  PHYSICAL EXAM: BP 125/73   Pulse 68   Temp (!) 96.8 F (36 C)   Resp 16   Wt 248 lb 6.4 oz (112.7 kg)   SpO2 99%   BMI 31.05 kg/m   Wt Readings from Last 3 Encounters:  12/09/19 248 lb 6.4 oz (112.7 kg)  11/11/19 236 lb (107 kg)  11/08/19 237 lb (107.5 kg)    Physical Exam General appearance - alert, well appearing, older African-American male and in no distress.  Patient sitting on the chair of his Rollator walker Mental status - normal mood, behavior, speech, dress, motor activity, and thought processes Chest - clear to auscultation, no wheezes, rales or rhonchi, symmetric air entry Heart - normal rate, regular rhythm, normal S1, S2, no murmurs, rubs, clicks or gallops Diabetic Foot Exam - Simple   Simple Foot Form Visual Inspection See comments: Yes Sensation Testing See comments: Yes Pulse Check Posterior Tibialis and Dorsalis pulse intact bilaterally: Yes Comments Patient with 3 cm hard callus with central white core on the plantar surface of the left big toe.  He has a larger 1 on the ball of the left foot more laterally.  Toenails are thickened discolored.  Decreased sensation on plantar surface of the feet      CMP Latest Ref Rng & Units 11/08/2019 07/13/2019 07/13/2019  Glucose 70 - 99 mg/dL 176(H)  137(H) 145(H)  BUN 6 - 20 mg/dL _0 Creatinine 0.61 -  1.24 mg/dL 1.20 1.10 1.29(H)  Sodium 135 - 145 mmol/L 141 137 137  Potassium 3.5 - 5.1 mmol/L 4.4 4.4 4.5  Chloride 98 - 111 mmol/L 107 104 104  CO2 22 - 32 mmol/L 26 - 21(L)  Calcium 8.9 - 10.3 mg/dL 9.2 - 9.2  Total Protein 6.5 - 8.1 g/dL 7.0 - 7.3  Total Bilirubin 0.3 - 1.2 mg/dL 0.4 - 0.7  Alkaline Phos 38 - 126 U/L 62 - 59  AST 15 - 41 U/L 13(L) - 19  ALT 0 - 44 U/L 13 - 15   Lipid Panel     Component Value Date/Time   CHOL 176 04/17/2019 1034   TRIG 174 (H) 04/17/2019 1034   HDL 52 04/17/2019 1034   CHOLHDL 3.4 04/17/2019 1034   CHOLHDL 3.4 03/05/2018 0731   VLDL 18 03/05/2018 0731   LDLCALC 94 04/17/2019 1034    CBC    Component Value Date/Time   WBC 8.3 11/08/2019 1626   RBC 4.88 11/08/2019 1626   HGB 13.7 11/08/2019 1626   HGB 13.3 04/17/2019 1034   HCT 42.5 11/08/2019 1626   HCT 39.3 04/17/2019 1034   PLT 250 11/08/2019 1626   PLT 233 04/17/2019 1034   MCV 87.1 11/08/2019 1626   MCV 84 04/17/2019 1034   MCH 28.1 11/08/2019 1626   MCHC 32.2 11/08/2019 1626   RDW 13.8 11/08/2019 1626   RDW 12.7 04/17/2019 1034   LYMPHSABS 2.3 11/08/2019 1626   MONOABS 0.7 11/08/2019 1626   EOSABS 0.3 11/08/2019 1626   BASOSABS 0.1 11/08/2019 1626    ASSESSMENT AND PLAN:  1. Essential hypertension Patient's blood pressure today is good.  However he reports that home blood pressure readings have been high but he does not recall the readings and does not have a log with him.  Patient advised to write down his readings.  Follow-up with clinical pharmacist in 3 weeks with his readings for review.  No changes made to medicines today.  2. Pre-ulcerative calluses Advised patient that we do not prescribe oxycodone through our clinic.  He has 2 large painful callus on the sole of the left foot.  I will get him in with his podiatrist Dr. Adah Perl for management. - Ambulatory referral to Podiatry  3. Scalp cyst I will  resubmit referral for the dermatologist - Ambulatory referral to Dermatology  4. Uncontrolled type 2 diabetes mellitus with peripheral neuropathy (HCC) Check A1c today.  Encourage healthy eating habits.  If A1c is not at goal which I suspect it will not be based on his blood sugar readings, patient declines the addition of insulin or Trulicity.  He is on several oral medications already. - Hemoglobin A1c   Patient was given the opportunity to ask questions.  Patient verbalized understanding of the plan and was able to repeat key elements of the plan.   Orders Placed This Encounter  Procedures  . Ambulatory referral to Dermatology  . Ambulatory referral to Podiatry     Requested Prescriptions    No prescriptions requested or ordered in this encounter    Return for [facility] in 3 weeks for repeat blood pressure check.Karle Plumber, MD, FACP

## 2019-12-09 NOTE — Progress Notes (Signed)
Pt states his bp has been running high

## 2019-12-10 ENCOUNTER — Other Ambulatory Visit: Payer: Self-pay | Admitting: *Deleted

## 2019-12-10 LAB — HEMOGLOBIN A1C
Est. average glucose Bld gHb Est-mCnc: 163 mg/dL
Hgb A1c MFr Bld: 7.3 % — ABNORMAL HIGH (ref 4.8–5.6)

## 2019-12-10 NOTE — Patient Outreach (Signed)
[facility]) Care Management  12/10/2019  [name] 07/10/1961 [id number]   Call placed to member to follow up on management of chronic medical conditions.  State he is doing well, confirms he is receiving visits from personal care aide 7 days a week, pleased with the services.  Was seen by PCP on 5/18, A1C now decreased to 7.3 from 9.3 in September 2020.  State blood sugars remain below 200, today was 175.  Reported to MD that his BP had been slightly elevated over the last several weeks, was 125/73 yesterday during office visit, was 120/78 today at home.  State he has been monitoring blood pressure but not always recording them, advised to record trends as he has follow up scheduled for 6/8.  Denies any urgent concerns, will contact member for follow up within the next month.  TH[facility] CM Care Plan Problem One     Most Recent Value  Care Plan Problem One  Knowledge deficit regarding management of diabetes as evidenced by elevated A1C   Role Documenting the Problem One  Care Management [place] for Problem One  Active  THN Long Term Goal   Member will report decreased A1C to </= 8 within the next 3 months  THN Long Term Goal Start Date  09/15/19  [facility] Long Term Goal Met Date  12/10/19  [facility] CM Short Term Goal #1   Member will report keeping and attend follow up with PCP within the next 3 weeks  THN CM Short Term Goal #1 Start Date  12/10/19  Interventions for Short Term Goal #1  Upcoming appointment reviewed with member.  Discusesd proper plan of care (diet, medications, etc) in effort to keep member stable with chronic medical conditions  THN CM Short Term Goal #2   Member will report having in home aide services start witihn the next 4 weeks  THN CM Short Term Goal #2 Start Date  11/11/19 [Not met, date reset]  [facility] CM Short Term Goal #2 Met Date  12/10/19  [facility] CM Short Term Goal #3  Member will report monitoring and recording BP daily over the next 2 weeks   THN CM Short Term Goal #3 Start Date  12/10/19  Interventions for Short Tern Goal #3  Member educated on importance of checking as well as recording to report trends to MD     [name], RN, MSN Scraper Manager [phone]

## 2019-12-10 NOTE — Progress Notes (Signed)
Let patient know that his A1c is 7.3 with goal being less than 7.  It has dropped by 2 points compared to when last checked in September of last year when it was 9.3.  Continue current medications and trying to eat healthy.

## 2019-12-12 ENCOUNTER — Encounter: Payer: Self-pay | Admitting: Podiatry

## 2019-12-12 ENCOUNTER — Ambulatory Visit (INDEPENDENT_AMBULATORY_CARE_PROVIDER_SITE_OTHER): Payer: Medicare Other | Admitting: Podiatry

## 2019-12-12 ENCOUNTER — Other Ambulatory Visit: Payer: Self-pay

## 2019-12-12 DIAGNOSIS — M79674 Pain in right toe(s): Secondary | ICD-10-CM

## 2019-12-12 DIAGNOSIS — L84 Corns and callosities: Secondary | ICD-10-CM

## 2019-12-12 DIAGNOSIS — E1142 Type 2 diabetes mellitus with diabetic polyneuropathy: Secondary | ICD-10-CM | POA: Diagnosis not present

## 2019-12-12 DIAGNOSIS — B351 Tinea unguium: Secondary | ICD-10-CM

## 2019-12-12 DIAGNOSIS — M79675 Pain in left toe(s): Secondary | ICD-10-CM

## 2019-12-12 NOTE — Patient Instructions (Signed)

## 2019-12-13 ENCOUNTER — Telehealth: Payer: Self-pay

## 2019-12-13 NOTE — Telephone Encounter (Signed)
Contacted pt to go over lab results pt is aware and doesn't have any questions or concerns 

## 2019-12-15 ENCOUNTER — Other Ambulatory Visit: Payer: Self-pay | Admitting: Pharmacist

## 2019-12-15 MED ORDER — BLOOD GLUCOSE MONITOR KIT: PACK | 0 refills | Status: DC

## 2019-12-18 DIAGNOSIS — Z23 Encounter for immunization: Secondary | ICD-10-CM | POA: Diagnosis not present

## 2019-12-19 NOTE — Progress Notes (Signed)
Subjective: [name] presents today at risk foot care with history of diabetic neuropathy and painful callus(es) which are preulcerative on the plantar aspect of both feet and painful mycotic toenails b/l that are difficult to trim. Pain interferes with ambulation. Aggravating factors include wearing enclosed shoe gear. Pain is relieved with periodic professional debridement.  Patient states both lesions of left foot are tender. Denies seeing any drainage on socks. Denies any fever, chills, night sweats, nausea or vomiting.  Ladell Pier, MD is patient's PCP. Last visit was: 12/09/2019.  Past Medical History:  Diagnosis Date  . Alcohol abuse    11-15-2017  per pt last alcohol Dec 2018  . BPH with obstruction/lower urinary tract symptoms   . Chronic arm pain   . Chronic pain    arms, leg, back  . CKD (chronic kidney disease), stage II   . Cocaine abuse ([place])    11-15-2017  per pt last used March 2019  . Congenital hydrocephalus, unspecified (HCC)    slow progression  . Diabetic retinopathy of both eyes ([place])   . Diastolic CHF, chronic ([place]) 07/2017  . Gait instability    multifactorial -- slow worsening congenital hydrocephalus, peripheral neuropathy, left C5-6 cord lesion  . Hematuria   . History of acute pulmonary edema 07/2017  . History of spinal cord injury 01/02/2014   pt fell, caused spinal cord contusion at C5-6--  residual left side weakness    . 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 History of TIA (transient ischemic attack) and stroke    hx cva 12-20-2010 and  TIA 02-22-2011--- no residual's from cva or tia  . Hyperlipidemia   . Hypertension    followed by pcp  . 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 Lesion of bladder   . Peripheral edema    chronic LUE  . Peripheral neuropathy   . Type 2 diabetes mellitus ([place])    followed by dr Karle Plumber--  last  A1c 6.8 on 11-06-2017  . Weak urinary stream      Current Outpatient Medications on File Prior to Visit  Medication Sig Dispense Refill  . acetaminophen (TYLENOL) 500 MG tablet TAKE ONE TABLET BY MOUTH TWICE A DAY AS NEEDED (Patient taking differently: Take 500 mg by mouth 2 (two) times daily as needed. ) 60 tablet 0  . aspirin EC 81 MG tablet Take 1 tablet (81 mg total) by mouth daily. 30 tablet 4  . atorvastatin (LIPITOR) 40 MG tablet Take 1.5 tablets (60 mg total) by mouth daily. 45 tablet 6  . Blood Glucose Monitoring Suppl (ACCU-CHEK GUIDE ME) w/Device KIT 1 kit by Does not apply route 3 (three) times daily. Use as instructed to check blood sugar 3 times daily. E11.14 1 kit 0  . clindamycin (CLEOCIN) 300 MG capsule Take 1 capsule (300 mg total) by mouth 3 (three) times daily. 21 capsule 0  . dapagliflozin propanediol (FARXIGA) 10 MG TABS tablet Take 10 mg by mouth daily before breakfast. 30 tablet 6  . DULoxetine (CYMBALTA) 60 MG capsule TAKE 1 CAPSULE (60 MG TOTAL) BY MOUTH DAILY. FOR BACK AND LEG PAINS 30 capsule 2  . furosemide (LASIX) 40 MG tablet TAKE ONE TABLET BY MOUTH DAILY . TAKE EXTRA TABLET FOR TWO DAYS AS NEEDED FOR INCREASE LOWER EXTREMITY SWELLING 40 tablet 1  . gabapentin (NEURONTIN) 100 MG capsule TAKE TWO CAPSULES (200 MG TOTAL) BY  MOUTH TWO (TWO) TIMES DAILY. 120 capsule 5  . glipiZIDE (GLUCOTROL XL) 10 MG 24 hr tablet TAKE 1 TABLET (10 MG TOTAL) BY MOUTH DAILY WITH BREAKFAST. 30 tablet 0  . glucose blood (ACCU-CHEK GUIDE) test strip Use as instructed to check blood sugar 3 times daily. E11.14 100 each 11  . hydrALAZINE (APRESOLINE) 10 MG tablet TAKE ONE TABLET BY MOUTH TWICE A DAY 60 tablet 2  . labetalol (NORMODYNE) 100 MG tablet TAKE ONE TABLET BY MOUTH TWICE A DAY 60 tablet 0  . Lancets Misc. (ACCU-CHEK SOFTCLIX LANCET DEV) KIT Use as directed 1 kit 0  . Lancets MISC Use as instructed to check blood sugar 3 times daily. E11.14 100 each 11  . lisinopril (ZESTRIL) 40 MG  tablet TAKE ONE TABLET BY MOUTH DAILY 30 tablet 0  . loratadine (CLARITIN) 10 MG tablet TAKE ONE TABLET BY MOUTH DAILY AS NEEDED FOR ALLERGIES 30 tablet 0  . metFORMIN (GLUCOPHAGE) 1000 MG tablet Take 1 tablet (1,000 mg total) by mouth 2 (two) times daily with a meal. 180 tablet 3   No current facility-administered medications on file prior to visit.     Allergies  Allergen Reactions  . Amlodipine Swelling    Lower extremity swelling    Objective: [name] is a pleasant 59 y.o. y.o. Patient Race: Black or African American [2]  male in NAD. AAO x 3.  There were no vitals filed for this visit.  Vascular Examination: Neurovascular status unchanged b/l. Capillary fill time to digits <3 seconds b/l. Faintly palpable pedal pulses b/l. Pedal hair absent b/l Skin temperature gradient within normal limits b/l. No pain with calf compression b/l.  Dermatological Examination: Pedal skin with normal turgor, texture and tone bilaterally. No open wounds bilaterally. No interdigital macerations bilaterally. Toenails 1-5 b/l elongated, dystrophic, thickened, crumbly with subungual debris and tenderness to dorsal palpation. Hyperkeratotic lesion(s) L hallux and submet head 4 left foot, both with subdermal hemorrhage. +Tenderness to palpation.  No erythema, no edema, no drainage, no flocculence, no abnormal warmth/coolness.  Musculoskeletal: Normal muscle strength 5/5 to all lower extremity muscle groups bilaterally. No pain crepitus or joint limitation noted with ROM b/l. Pes planus deformity noted b/l.  Utilizes rollator for ambulation assistance.  Neurological Examination: Protective sensation diminished with 10g monofilament b/l. Vibratory sensation diminished b/l.  Assessment: 1. Pain due to onychomycosis of toenails of both feet   2. Pre-ulcerative calluses   3. Diabetic peripheral neuropathy associated with type 2 diabetes mellitus ([place])    Plan: -Examined patient. -Continue  diabetic foot care principles. Literature dispensed on today.  -Toenails 1-5 b/l were debrided in length and girth with sterile nail nippers and dremel without iatrogenic bleeding.  -Preulcerative callus(es) L hallux and submet head 4 left foot pared utilizing sterile scalpel blade without complication or incident. Total number debrided =2. -Advised patient to schedule appointment with Liliane Channel or [facility] for modification of insole of left foot to further offload lesions. -Patient to continue soft, supportive shoe gear daily. -Patient to report any pedal injuries to medical professional immediately. -Patient/POA to call should there be question/concern in the interim.  Return in about 9 weeks (around 02/13/2020).  Marzetta Board, DPM

## 2019-12-23 ENCOUNTER — Other Ambulatory Visit: Payer: Self-pay

## 2019-12-23 MED ORDER — BLOOD GLUCOSE MONITOR KIT: PACK | 0 refills | Status: DC

## 2019-12-25 ENCOUNTER — Emergency Department (HOSPITAL_COMMUNITY): Payer: Medicare Other

## 2019-12-25 ENCOUNTER — Encounter (HOSPITAL_COMMUNITY): Payer: Self-pay | Admitting: Emergency Medicine

## 2019-12-25 ENCOUNTER — Institutional Professional Consult (permissible substitution): Payer: Medicare Other | Admitting: Licensed Clinical Social Worker

## 2019-12-25 ENCOUNTER — Emergency Department (HOSPITAL_COMMUNITY)
Admission: EM | Admit: 2019-12-25 | Discharge: 2019-12-25 | Disposition: A | Discharge status: Home / Self Care | Payer: Medicare Other | Attending: Emergency Medicine | Admitting: Emergency Medicine

## 2019-12-25 ENCOUNTER — Other Ambulatory Visit: Payer: Self-pay

## 2019-12-25 DIAGNOSIS — G459 Transient cerebral ischemic attack, unspecified: Secondary | ICD-10-CM | POA: Diagnosis not present

## 2019-12-25 DIAGNOSIS — I5032 Chronic diastolic (congestive) heart failure: Secondary | ICD-10-CM | POA: Diagnosis not present

## 2019-12-25 DIAGNOSIS — H903 Sensorineural hearing loss, bilateral: Secondary | ICD-10-CM | POA: Diagnosis not present

## 2019-12-25 DIAGNOSIS — R29818 Other symptoms and signs involving the nervous system: Secondary | ICD-10-CM | POA: Diagnosis not present

## 2019-12-25 DIAGNOSIS — I130 Hypertensive heart and chronic kidney disease with heart failure and stage 1 through stage 4 chronic kidney disease, or unspecified chronic kidney disease: Secondary | ICD-10-CM | POA: Insufficient documentation

## 2019-12-25 DIAGNOSIS — R531 Weakness: Secondary | ICD-10-CM | POA: Insufficient documentation

## 2019-12-25 DIAGNOSIS — Z87891 Personal history of nicotine dependence: Secondary | ICD-10-CM | POA: Insufficient documentation

## 2019-12-25 DIAGNOSIS — I10 Essential (primary) hypertension: Secondary | ICD-10-CM | POA: Diagnosis not present

## 2019-12-25 DIAGNOSIS — Z79899 Other long term (current) drug therapy: Secondary | ICD-10-CM | POA: Diagnosis not present

## 2019-12-25 DIAGNOSIS — Z7982 Long term (current) use of aspirin: Secondary | ICD-10-CM | POA: Insufficient documentation

## 2019-12-25 DIAGNOSIS — E1122 Type 2 diabetes mellitus with diabetic chronic kidney disease: Secondary | ICD-10-CM | POA: Insufficient documentation

## 2019-12-25 DIAGNOSIS — Z8673 Personal history of transient ischemic attack (TIA), and cerebral infarction without residual deficits: Secondary | ICD-10-CM | POA: Insufficient documentation

## 2019-12-25 DIAGNOSIS — N182 Chronic kidney disease, stage 2 (mild): Secondary | ICD-10-CM | POA: Insufficient documentation

## 2019-12-25 DIAGNOSIS — Z7984 Long term (current) use of oral hypoglycemic drugs: Secondary | ICD-10-CM | POA: Diagnosis not present

## 2019-12-25 DIAGNOSIS — E113299 Type 2 diabetes mellitus with mild nonproliferative diabetic retinopathy without macular edema, unspecified eye: Secondary | ICD-10-CM | POA: Diagnosis not present

## 2019-12-25 DIAGNOSIS — Z743 Need for continuous supervision: Secondary | ICD-10-CM | POA: Diagnosis not present

## 2019-12-25 DIAGNOSIS — R279 Unspecified lack of coordination: Secondary | ICD-10-CM | POA: Diagnosis not present

## 2019-12-25 LAB — CBC
HCT: 43.7 % (ref 39.0–52.0)
Hemoglobin: 14 g/dL (ref 13.0–17.0)
MCH: 28 pg (ref 26.0–34.0)
MCHC: 32 g/dL (ref 30.0–36.0)
MCV: 87.4 fL (ref 80.0–100.0)
Platelets: 260 10*3/uL (ref 150–400)
RBC: 5 MIL/uL (ref 4.22–5.81)
RDW: 13.8 % (ref 11.5–15.5)
WBC: 7.8 10*3/uL (ref 4.0–10.5)
nRBC: 0 % (ref 0.0–0.2)

## 2019-12-25 LAB — COMPREHENSIVE METABOLIC PANEL
ALT: 10 U/L (ref 0–44)
AST: 13 U/L — ABNORMAL LOW (ref 15–41)
Albumin: 3.8 g/dL (ref 3.5–5.0)
Alkaline Phosphatase: 55 U/L (ref 38–126)
Anion gap: 9 (ref 5–15)
BUN: 23 mg/dL (ref 8–23)
CO2: 23 mmol/L (ref 22–32)
Calcium: 9.2 mg/dL (ref 8.9–10.3)
Chloride: 108 mmol/L (ref 98–111)
Creatinine, Ser: 1.29 mg/dL — ABNORMAL HIGH (ref 0.61–1.24)
GFR calc Af Amer: >60 mL/min (ref >60)
GFR calc non Af Amer: 59 mL/min — ABNORMAL LOW (ref >60)
Glucose, Bld: 122 mg/dL — ABNORMAL HIGH (ref 70–99)
Potassium: 4.4 mmol/L (ref 3.5–5.1)
Sodium: 140 mmol/L (ref 135–145)
Total Bilirubin: 0.9 mg/dL (ref 0.3–1.2)
Total Protein: 7.3 g/dL (ref 6.5–8.1)

## 2019-12-25 LAB — PROTIME-INR
INR: 1 (ref 0.8–1.2)
Prothrombin Time: 12.9 seconds (ref 11.4–15.2)

## 2019-12-25 LAB — DIFFERENTIAL
Abs Immature Granulocytes: 0.03 10*3/uL (ref 0.00–0.07)
Basophils Absolute: 0.1 10*3/uL (ref 0.0–0.1)
Basophils Relative: 1 %
Eosinophils Absolute: 0.2 10*3/uL (ref 0.0–0.5)
Eosinophils Relative: 2 %
Immature Granulocytes: 0 %
Lymphocytes Relative: 24 %
Lymphs Abs: 1.9 10*3/uL (ref 0.7–4.0)
Monocytes Absolute: 0.7 10*3/uL (ref 0.1–1.0)
Monocytes Relative: 9 %
Neutro Abs: 5 10*3/uL (ref 1.7–7.7)
Neutrophils Relative %: 64 %

## 2019-12-25 LAB — CBG MONITORING, ED: Glucose-Capillary: 93 mg/dL (ref 70–99)

## 2019-12-25 LAB — I-STAT CHEM 8, ED
BUN: 23 mg/dL (ref 8–23)
Calcium, Ion: 1.19 mmol/L (ref 1.15–1.40)
Chloride: 108 mmol/L (ref 98–111)
Creatinine, Ser: 1.3 mg/dL — ABNORMAL HIGH (ref 0.61–1.24)
Glucose, Bld: 118 mg/dL — ABNORMAL HIGH (ref 70–99)
HCT: 41 % (ref 39.0–52.0)
Hemoglobin: 13.9 g/dL (ref 13.0–17.0)
Potassium: 4.3 mmol/L (ref 3.5–5.1)
Sodium: 142 mmol/L (ref 135–145)
TCO2: 23 mmol/L (ref 22–32)

## 2019-12-25 LAB — APTT: aPTT: 29 seconds (ref 24–36)

## 2019-12-25 MED ORDER — SODIUM CHLORIDE 0.9% FLUSH
3.0000 mL | Freq: Once | INTRAVENOUS | Status: DC
Start: 2019-12-25 — End: 2019-12-26

## 2019-12-25 NOTE — ED Notes (Signed)
[facility] called to transport pt home 

## 2019-12-25 NOTE — ED Notes (Signed)
Pt transported to MRI 

## 2019-12-25 NOTE — Discharge Instructions (Signed)
It was our pleasure to provide your ER care today - we hope that you feel better.  Follow up with your doctor in the next couple weeks.   We have made home health referral for physical therapy - they should be contacting you.  Return to ER if worse, new symptoms, fevers, new or severe pain, trouble breathing, or other concern.

## 2019-12-25 NOTE — ED Notes (Signed)
[facility] at facility for transport. Pt stable and transported on stretcher. Discharge papers reviewed again. Pt verbalizes understanding

## 2019-12-25 NOTE — Discharge Planning (Signed)
RNCM confirmed that pt has home health (PCS) services through [facility] homecare 7 days a week.  Resumption of services will continue post discharge.

## 2019-12-25 NOTE — ED Provider Notes (Signed)
[facility] EMERGENCY DEPARTMENT Provider Note   CSN: [id number] Arrival date & time: 12/25/19  0354     History Chief Complaint  Patient presents with  . Weakness    [name] is a 59 y.o. male.  Patient c/o increased left sided weakness in past day. States hx prior stroke, hx cord contusion, and chronic left weakness, but states seems increased since yesterday. States around 2 am, when wanted to get up to bathroom, was too weak to stand/walk due to left side feeling weak. Symptoms gradual onset, constant, persistent. Denies fall or trauma. No syncope. No headache. Denies numbness. No change in speech or vision. No neck or back pain. No fever or chills.   The history is provided by the patient.  Weakness Associated symptoms: no abdominal pain, no chest pain, no fever, no headaches and no shortness of breath        Past Medical History:  Diagnosis Date  . Alcohol abuse    11-15-2017  per pt last alcohol Dec 2018  . BPH with obstruction/lower urinary tract symptoms   . Chronic arm pain   . Chronic pain    arms, leg, back  . CKD (chronic kidney disease), stage II   . Cocaine abuse ([place])    11-15-2017  per pt last used March 2019  . Congenital hydrocephalus, unspecified (HCC)    slow progression  . Diabetic retinopathy of both eyes ([place])   . Diastolic CHF, chronic ([place]) 07/2017  . Gait instability    multifactorial -- slow worsening congenital hydrocephalus, peripheral neuropathy, left C5-6 cord lesion  . Hematuria   . History of acute pulmonary edema 07/2017  . History of spinal cord injury 01/02/2014   pt fell, caused spinal cord contusion at C5-6--  residual left side weakness    . 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 History of TIA (transient ischemic attack) and stroke    hx cva 12-20-2010 and  TIA 02-22-2011--- no residual's from cva or tia  . Hyperlipidemia   .  Hypertension    followed by pcp  . 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 Lesion of bladder   . Peripheral edema    chronic LUE  . Peripheral neuropathy   . Type 2 diabetes mellitus ([place])    followed by dr Karle Plumber--  last A1c 6.8 on 11-06-2017  . Weak urinary stream     Patient Active Problem List   Diagnosis Date Noted  . History of spinal cord injury 04/17/2019  . TIA (transient ischemic attack) 03/05/2018  . Benign essential HTN 03/05/2018  . Sensorineural hearing loss of both ears 12/04/2017  . Mild nonproliferative diabetic retinopathy ([place]) 01/10/2016  . Hypertensive retinopathy of both eyes 01/10/2016  . Spinal stenosis of lumbar region 07/15/2015  . Abnormality of gait 06/07/2015  . Hydrocephalus ([place]) 06/07/2015  . Peripheral neuropathy 04/26/2015  . Constipation 04/26/2015  . Diabetes mellitus type 2 with complications ([place]) 51/70/0174  . Decreased hearing of both ears 03/15/2015  . Tinea pedis 03/15/2015  . Pre-ulcerative calluses 05/22/2014  . Cocaine abuse ([place]) 12/28/2013  . Left-sided weakness 12/26/2013  . Hyperlipidemia associated with type 2 diabetes mellitus ([place]) 04/01/2007  . Class 2 severe obesity due to excess calories with serious comorbidity and body mass index (BMI) of 36.0 to 36.9 in adult ([place]) 03/28/2007  . Essential hypertension 03/28/2007    Past Surgical  History:  Procedure Laterality Date  . CARDIAC CATHETERIZATION  12/01/2008   dr hochrein   abnormal stress myoview:  mild coronary plaque, normal LVF  . CARDIAC CATHETERIZATION  04/29/2011   dr [place]   in setting ECG with new ST elevation & severe hypertensive:  nonobstructive atherosclerotic CAD, normal LVF (30% mRCA)   . CARDIOVASCULAR STRESS TEST  08/15/2010   Low nuclear study w/ no evidence ishemia/  ef 42% with lateral and apical hypokinesis  . CYSTOSCOPY WITH BIOPSY N/A 11/20/2017   Procedure: CYSTOSCOPY WITH BIOPSY AND FULGURATION;  Surgeon:  [name], MD;  Location: [facility];  Service: Urology;  Laterality: N/A;  . INCISION AND DRAINAGE RIGHT DISTAL MEDIAL THIGH HEMATOMA  04-14-2005   dr Ninfa Linden  . left arm skin graft  1976   injury  . LEFT HEART CATHETERIZATION WITH CORONARY ANGIOGRAM Bilateral 12/26/2013   Procedure: LEFT HEART CATHETERIZATION WITH CORONARY ANGIOGRAM;  Surgeon: Burnell Blanks, MD;  Location: [facility] CATH LAB;  Service: Cardiovascular;  Laterality: Bilateral;  STEMI, in setting cocaine/ alcohol :  mid disease in the RCA (20%), moderate disease in the small caliber intermediate branch (distal 50-60%), normal LVSF (ef 55-60%)  . TOOTH EXTRACTION N/A 05/16/2018   Procedure: DENTAL RESTORATION/EXTRACTIONS WITH ALVEO;  Surgeon: Diona Browner, DDS;  Location: [facility];  Service: Oral Surgery;  Laterality: N/A;  . TRANSTHORACIC ECHOCARDIOGRAM  08/05/2017   moderate LVH,  ef 55%,  grade 1 diastolic dysfunction/  mild LAE/  trivial Tr       Family History  Problem Relation Age of Onset  . Hypertension Mother   . Diabetes Mother   . Cancer Mother        breast cancer   . Hypertension Father   . Heart disease Father   . Stroke Father   . Hypertension Sister     Social History   Tobacco Use  . Smoking status: Former Smoker    Years: 45.00    Types: Cigarettes    Quit date: 03/03/2018    Years since quitting: 1.8  . Smokeless tobacco: Never Used  . Tobacco comment: 11-15-2017  per pt 1pp3days  Substance Use Topics  . Alcohol use: Not Currently    Alcohol/week: 0.0 standard drinks    Comment: 11-15-2017  per pt last alcohol 12/ 2018  alcohol abuse  . Drug use: Not Currently    Types: "Crack" cocaine, Cocaine    Comment: 11-15-2017  per pt last used March 2019    Home Medications Prior to Admission medications   Medication Sig Start Date End Date Taking? Authorizing Provider  acetaminophen (TYLENOL) 500 MG tablet TAKE ONE TABLET BY MOUTH TWICE A DAY AS  NEEDED Patient taking differently: Take 500 mg by mouth 2 (two) times daily as needed.  05/19/19   Ladell Pier, MD  aspirin EC 81 MG tablet Take 1 tablet (81 mg total) by mouth daily. 07/03/19   Frann Rider, NP  atorvastatin (LIPITOR) 40 MG tablet Take 1.5 tablets (60 mg total) by mouth daily. 04/20/19   Ladell Pier, MD  blood glucose meter kit and supplies KIT Dispense based on patient and insurance preference. Use once daily as directed. (E11.9) 12/23/19   Ladell Pier, MD  Blood Glucose Monitoring Suppl (ACCU-CHEK GUIDE ME) w/Device KIT 1 kit by Does not apply route 3 (three) times daily. Use as instructed to check blood sugar 3 times daily. E11.14 08/14/19   Ladell Pier, MD  clindamycin (  CLEOCIN) 300 MG capsule Take 1 capsule (300 mg total) by mouth 3 (three) times daily. 07/07/19   Ladell Pier, MD  dapagliflozin propanediol (FARXIGA) 10 MG TABS tablet Take 10 mg by mouth daily before breakfast. 07/07/19   Ladell Pier, MD  DULoxetine (CYMBALTA) 60 MG capsule TAKE 1 CAPSULE (60 MG TOTAL) BY MOUTH DAILY. FOR BACK AND LEG PAINS 09/16/19   Ladell Pier, MD  furosemide (LASIX) 40 MG tablet TAKE ONE TABLET BY MOUTH DAILY . TAKE EXTRA TABLET FOR TWO DAYS AS NEEDED FOR INCREASE LOWER EXTREMITY SWELLING 09/16/19   Ladell Pier, MD  gabapentin (NEURONTIN) 100 MG capsule TAKE TWO CAPSULES (200 MG TOTAL) BY MOUTH TWO (TWO) TIMES DAILY. 07/30/19   Ladell Pier, MD  glipiZIDE (GLUCOTROL XL) 10 MG 24 hr tablet TAKE 1 TABLET (10 MG TOTAL) BY MOUTH DAILY WITH BREAKFAST. 11/03/19   Ladell Pier, MD  glucose blood (ACCU-CHEK GUIDE) test strip Use as instructed to check blood sugar 3 times daily. E11.14 08/14/19   Ladell Pier, MD  hydrALAZINE (APRESOLINE) 10 MG tablet TAKE ONE TABLET BY MOUTH TWICE A DAY 08/26/19   Ladell Pier, MD  labetalol (NORMODYNE) 100 MG tablet TAKE ONE TABLET BY MOUTH TWICE A DAY 11/03/19   Ladell Pier, MD  Lancets  Misc. (ACCU-CHEK SOFTCLIX LANCET DEV) KIT Use as directed 12/19/17   Ladell Pier, MD  Lancets MISC Use as instructed to check blood sugar 3 times daily. E11.14 08/14/19   Ladell Pier, MD  lisinopril (ZESTRIL) 40 MG tablet TAKE ONE TABLET BY MOUTH DAILY 11/03/19   Ladell Pier, MD  loratadine (CLARITIN) 10 MG tablet TAKE ONE TABLET BY MOUTH DAILY AS NEEDED FOR ALLERGIES 11/03/19   Ladell Pier, MD  metFORMIN (GLUCOPHAGE) 1000 MG tablet Take 1 tablet (1,000 mg total) by mouth 2 (two) times daily with a meal. 04/17/19   Ladell Pier, MD    Allergies    Amlodipine  Review of Systems   Review of Systems  Constitutional: Negative for fever.  HENT: Negative for sore throat.   Eyes: Negative for redness.  Respiratory: Negative for shortness of breath.   Cardiovascular: Negative for chest pain.  Gastrointestinal: Negative for abdominal pain.  Genitourinary: Negative for flank pain.  Musculoskeletal: Negative for back pain and neck pain.  Skin: Negative for rash.  Neurological: Positive for weakness. Negative for speech difficulty and headaches.  Hematological: Does not bruise/bleed easily.  Psychiatric/Behavioral: Negative for confusion.    Physical Exam Updated Vital Signs BP 124/61 (BP Location: Right Arm)   Pulse (P) 78   Temp (P) 98.6 F (37 C) (Oral)   Resp 18   SpO2 100%   Physical Exam Vitals and nursing note reviewed.  Constitutional:      Appearance: Normal appearance. He is well-developed.  HENT:     Head: Atraumatic.     Nose: Nose normal.     Mouth/Throat:     Mouth: Mucous membranes are moist.     Pharynx: Oropharynx is clear.  Eyes:     General: No scleral icterus.    Conjunctiva/sclera: Conjunctivae normal.     Pupils: Pupils are equal, round, and reactive to light.  Neck:     Vascular: No carotid bruit.     Trachea: No tracheal deviation.  Cardiovascular:     Rate and Rhythm: Normal rate and regular rhythm.     Pulses: Normal  pulses.     Heart  sounds: Normal heart sounds. No murmur. No friction rub. No gallop.   Pulmonary:     Effort: Pulmonary effort is normal. No accessory muscle usage or respiratory distress.     Breath sounds: Normal breath sounds.  Abdominal:     General: Bowel sounds are normal. There is no distension.     Palpations: Abdomen is soft.     Tenderness: There is no abdominal tenderness. There is no guarding.  Genitourinary:    Comments: No cva tenderness. Musculoskeletal:        General: No swelling.     Cervical back: Normal range of motion and neck supple. No rigidity.     Comments: CTLS spine, non tender, aligned, no step off.   Skin:    General: Skin is warm and dry.     Findings: No rash.  Neurological:     Mental Status: He is alert and oriented to person, place, and time.     Cranial Nerves: No cranial nerve deficit.     Sensory: No sensory deficit.     Comments: Alert, speech clear. Motor fxn right, 5/5. Left 4+/5.   Psychiatric:        Mood and Affect: Mood normal.      ED Results / Procedures / Treatments   Labs (all labs ordered are listed, but only abnormal results are displayed) Results for orders placed or performed during the hospital encounter of 12/25/19  Protime-INR  Result Value Ref Range   Prothrombin Time 12.9 11.4 - 15.2 seconds   INR 1.0 0.8 - 1.2  APTT  Result Value Ref Range   aPTT 29 24 - 36 seconds  CBC  Result Value Ref Range   WBC 7.8 4.0 - 10.5 K/uL   RBC 5.00 4.22 - 5.81 MIL/uL   Hemoglobin 14.0 13.0 - 17.0 g/dL   HCT 43.7 39.0 - 52.0 %   MCV 87.4 80.0 - 100.0 fL   MCH 28.0 26.0 - 34.0 pg   MCHC 32.0 30.0 - 36.0 g/dL   RDW 13.8 11.5 - 15.5 %   Platelets 260 150 - 400 K/uL   nRBC 0.0 0.0 - 0.2 %  Differential  Result Value Ref Range   Neutrophils Relative % 64 %   Neutro Abs 5.0 1.7 - 7.7 K/uL   Lymphocytes Relative 24 %   Lymphs Abs 1.9 0.7 - 4.0 K/uL   Monocytes Relative 9 %   Monocytes Absolute 0.7 0.1 - 1.0 K/uL    Eosinophils Relative 2 %   Eosinophils Absolute 0.2 0.0 - 0.5 K/uL   Basophils Relative 1 %   Basophils Absolute 0.1 0.0 - 0.1 K/uL   Immature Granulocytes 0 %   Abs Immature Granulocytes 0.03 0.00 - 0.07 K/uL  Comprehensive metabolic panel  Result Value Ref Range   Sodium 140 135 - 145 mmol/L   Potassium 4.4 3.5 - 5.1 mmol/L   Chloride 108 98 - 111 mmol/L   CO2 23 22 - 32 mmol/L   Glucose, Bld 122 (H) 70 - 99 mg/dL   BUN 23 8 - 23 mg/dL   Creatinine, Ser 1.29 (H) 0.61 - 1.24 mg/dL   Calcium 9.2 8.9 - 10.3 mg/dL   Total Protein 7.3 6.5 - 8.1 g/dL   Albumin 3.8 3.5 - 5.0 g/dL   AST 13 (L) 15 - 41 U/L   ALT 10 0 - 44 U/L   Alkaline Phosphatase 55 38 - 126 U/L   Total Bilirubin 0.9 0.3 - 1.2 mg/dL  GFR calc non Af Amer 59 (L) >60 mL/min   GFR calc Af Amer >60 >60 mL/min   Anion gap 9 5 - 15  I-stat chem 8, ED  Result Value Ref Range   Sodium 142 135 - 145 mmol/L   Potassium 4.3 3.5 - 5.1 mmol/L   Chloride 108 98 - 111 mmol/L   BUN 23 8 - 23 mg/dL   Creatinine, Ser 1.30 (H) 0.61 - 1.24 mg/dL   Glucose, Bld 118 (H) 70 - 99 mg/dL   Calcium, Ion 1.19 1.15 - 1.40 mmol/L   TCO2 23 22 - 32 mmol/L   Hemoglobin 13.9 13.0 - 17.0 g/dL   HCT 41.0 39.0 - 52.0 %  CBG monitoring, ED  Result Value Ref Range   Glucose-Capillary 93 70 - 99 mg/dL   CT Head Wo Contrast  Result Date: 12/25/2019 CLINICAL DATA:  Left leg weakness EXAM: CT HEAD WITHOUT CONTRAST TECHNIQUE: Contiguous axial images were obtained from the base of the skull through the vertex without intravenous contrast. COMPARISON:  November 08, 2019 FINDINGS: Brain: No evidence of acute territorial infarction, hemorrhage, hydrocephalus,extra-axial collection or mass lesion/mass effect. There is dilatation the ventricles and sulci consistent with age-related atrophy. Low-attenuation changes in the deep white matter consistent with small vessel ischemia. 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 Vascular: No hyperdense vessel or  unexpected calcification. Skull: The skull is intact. No fracture or focal lesion identified. Sinuses/Orbits: The visualized paranasal sinuses and mastoid air cells are clear. The orbits and globes intact. Other: None IMPRESSION: No acute intracranial abnormality. Findings consistent with age related atrophy and chronic small vessel ischemia Electronically Signed   By: Prudencio Pair M.D.   On: 12/25/2019 04:40    EKG EKG Interpretation  Date/Time:  Thursday December 25 2019 04:00:37 EDT Ventricular Rate:  74 PR Interval:  186 QRS Duration: 110 QT Interval:  388 QTC Calculation: 430 R Axis:   -66 Text Interpretation: Normal sinus rhythm Left anterior fascicular block Left ventricular hypertrophy with repolarization abnormality ( R in aVL , Cornell product ) Left axis deviation No significant change since last tracing Confirmed by Lajean Saver [id number]) on 12/25/2019 7:46:09 AM   Radiology CT Head Wo Contrast  Result Date: 12/25/2019 CLINICAL DATA:  Left leg weakness EXAM: CT HEAD WITHOUT CONTRAST TECHNIQUE: Contiguous axial images were obtained from the base of the skull through the vertex without intravenous contrast. COMPARISON:  November 08, 2019 FINDINGS: Brain: No evidence of acute territorial infarction, hemorrhage, hydrocephalus,extra-axial collection or mass lesion/mass effect. There is dilatation the ventricles and sulci consistent with age-related atrophy. Low-attenuation changes in the deep white matter consistent with small vessel ischemia. 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 Vascular: No hyperdense vessel or unexpected calcification. Skull: The skull is intact. No fracture or focal lesion identified. Sinuses/Orbits: The visualized paranasal sinuses and mastoid air cells are clear. The orbits and globes intact. Other: None IMPRESSION: No acute intracranial abnormality. Findings consistent with age related atrophy and chronic small vessel ischemia Electronically Signed   By: Prudencio Pair M.D.   On: 12/25/2019 04:40   MR BRAIN WO CONTRAST  Result Date: 12/25/2019 CLINICAL DATA:  Acute neuro deficit.  Left leg weakness EXAM: MRI HEAD WITHOUT CONTRAST TECHNIQUE: Multiplanar, multiecho pulse sequences of the brain and surrounding structures were obtained without intravenous contrast. COMPARISON:  MRI head 11/08/2019 FINDINGS: Brain: Negative for acute infarct. Few small white matter hyperintensities are present bilaterally consistent with chronic microvascular ischemia. Normal brainstem. Negative  for hemorrhage or mass Generalized atrophy. There is moderate enlargement of the lateral ventricles bilaterally with rounded contours suggesting obstructive hydrocephalus. Third and fourth ventricles nondilated. 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 The corpus callosum is thinned due to hydrocephalus. Ventricular enlargement is chronic and stable including a CT head of 12/17/2015. Vascular: Normal arterial flow voids. Skull and upper cervical spine: No focal skeletal lesion. Sinuses/Orbits: Mild mucosal edema paranasal sinuses.  Normal orbit. Other: None IMPRESSION: Negative for acute infarct Chronically dilated lateral ventricles greater than expected for the degree of atrophy. 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 Note is made of absence of the interventricular septum. Electronically Signed   By: Franchot Gallo M.D.   On: 12/25/2019 13:42    Procedures Procedures (including critical care time)  Medications Ordered in ED Medications  sodium chloride flush (NS) 0.9 % injection 3 mL (has no administration in time range)    ED Course  I have reviewed the triage vital signs and the nursing notes.  Pertinent labs & imaging results that were available during my care of the patient were reviewed by me and considered in my medical decision making (see chart for details).    MDM Rules/Calculators/A&P                      Labs sent.  Ct ordered.   Reviewed nursing notes and prior charts for additional history.   MDM Number of Diagnoses or Management Options   Amount and/or Complexity of Data Reviewed Clinical lab tests: ordered and reviewed Tests in the radiology section of CPT: ordered and reviewed Tests in the medicine section of CPT: ordered and reviewed Discussion of test results with the performing providers: yes Decide to obtain previous medical records or to obtain history from someone other than the patient: yes Obtain history from someone other than the patient: yes Review and summarize past medical records: yes Discuss the patient with other providers: yes Independent visualization of images, tracings, or specimens: yes  Risk of Complications, Morbidity, and/or Mortality Presenting problems: high Diagnostic procedures: high Management options: high   Initial labs reviewed/interpreted by me - chem normal. Wbc normal. hgb normal.   CT reviewed/interpreted by me - no hem.  Given pt feeling increased left weakness, will get MR r/o cva.  MRI reviewed/interpreted by me - no acute stroke.   Discussed results w pt.   TOC team consulted - re home health services.   Pt appears stable for d/c.   rec pcp f/u.  Return precautions provided.       Final Clinical Impression(s) / ED Diagnoses Final diagnoses:  None    Rx / DC Orders ED Discharge Orders    None       Lajean Saver, MD 12/25/19 1407

## 2019-12-25 NOTE — ED Notes (Signed)
PTAR on unit to transport pt home per MD order.

## 2019-12-25 NOTE — ED Notes (Signed)
RN spoke with pt, pt expresses the ability to walk inside as long as he has his walker. RN spoke with family member, Enid Derry. Family member states that she does not feel comfortable driving him home d/t his L leg weakness she feels she could not get him in the house safely. Family member states that she will be home at 2030 to [facility] for PT[facility].

## 2019-12-25 NOTE — Discharge Planning (Signed)
Pt previously active with [facility] for PT services.  Resumption of care requested. Janae Sauce of [facility] notified.  No DME needs identified at this time.

## 2019-12-25 NOTE — ED Notes (Signed)
RN consulted w/ Dr. Dayna Barker. MD verbal order for CT head, no code stroke at this time.

## 2019-12-25 NOTE — ED Triage Notes (Addendum)
Pt presents to ED BIB [facility]. Pt c/o increased L leg weakness. LKW 0000, pt L side weak from previous stroke but L leg is increased weakness today. Pt also c/o of talking "a little slow." All other neuro intact.

## 2019-12-27 DIAGNOSIS — I10 Essential (primary) hypertension: Secondary | ICD-10-CM | POA: Diagnosis not present

## 2019-12-29 ENCOUNTER — Telehealth: Payer: Self-pay | Admitting: Internal Medicine

## 2019-12-29 NOTE — Telephone Encounter (Signed)
Verbal orders requested.

## 2019-12-29 NOTE — Telephone Encounter (Signed)
Gm Darlene from [facility] requesting verbal orders   For PT  Frequency   Once  A week x 2 weeks  Twice A week  X  2 Weeks Once  A week X 6 Weeks  Please, call her at   [phone]  Thank you

## 2019-12-29 NOTE — Telephone Encounter (Signed)
Returned Darlene call to give verbal orders

## 2019-12-30 ENCOUNTER — Ambulatory Visit: Payer: Medicare Other | Admitting: Pharmacist

## 2020-01-08 ENCOUNTER — Other Ambulatory Visit: Payer: Self-pay | Admitting: *Deleted

## 2020-01-08 DIAGNOSIS — Z23 Encounter for immunization: Secondary | ICD-10-CM | POA: Diagnosis not present

## 2020-01-08 NOTE — Patient Outreach (Signed)
[facility]) Care Management  01/08/2020  [name] 1960-08-22 [id number]   Call placed to member to follow up on diabetes management and ED visit on 6/3.  Report he is "doing fine now."  State he was having some leg weakness, wasn't sure if there was an event that led to the symptoms but report he is much better now.  He has restarted PT with [facility], continues to have aide from [facility] home care 2 hours a day.  This care manager inquired about follow up with PCP as recommended, state he was waiting for a call for the virtual visit but never received a call.  Advised that per chart, he was to have in person visit and is marked as a no show.  Encouraged to contact office to explain he was told it would be virtual and to reschedule, he verbalizes understanding.    This care manager inquired about member's weights, blood pressure, and blood sugar, report he has not taken them today.  Inquired about yesterday's readings, denies having those as well.  Re-educated on importance of daily monitoring, he verbalizes understanding.  Denies any urgent concerns, will follow up with member within the next 2 weeks.  THN CM Care Plan Problem One     Most Recent Value  Care Plan Problem One Risk for admission related to management of chronic medical conditions  Role Documenting the Problem One Care Management [place] for Problem One Active  [facility] Long Term Goal  Member will not have any ED visits or admissions over the next 31 days  THN Long Term Goal Start Date 01/08/20  Interventions for Problem One Long Term Goal Most recent ED AVS reviewed with member.  Educated on non-adherence to care plan and the increased risk of hospitalization  [facility] CM Short Term Goal #1  Member will report rescheduling follow up with PCP within the next 2 weeks  [facility] CM Short Term Goal #1 Start Date 01/08/20  Interventions for Short Term Goal #1 Member educated on missed in person visit,  strongly encouraged to call to reschedule  [facility] CM Short Term Goal #3 Member will report monitoring and recording BP daily over the next 2 weeks  THN CM Short Term Goal #3 Start Date 01/08/20  Sudie Grumbling reset]  Interventions for Short Tern Goal #3 Member educated again on importance of daily monitoring and management of chronic conditions     [name], Therapist, sports, MSN [place] Manager [phone]

## 2020-01-13 ENCOUNTER — Other Ambulatory Visit: Payer: Self-pay

## 2020-01-13 ENCOUNTER — Ambulatory Visit: Payer: Medicare Other | Attending: Internal Medicine | Admitting: Internal Medicine

## 2020-01-13 DIAGNOSIS — R9089 Other abnormal findings on diagnostic imaging of central nervous system: Secondary | ICD-10-CM | POA: Diagnosis not present

## 2020-01-13 DIAGNOSIS — G8929 Other chronic pain: Secondary | ICD-10-CM | POA: Diagnosis not present

## 2020-01-13 DIAGNOSIS — R531 Weakness: Secondary | ICD-10-CM

## 2020-01-13 DIAGNOSIS — R269 Unspecified abnormalities of gait and mobility: Secondary | ICD-10-CM

## 2020-01-13 NOTE — Progress Notes (Signed)
Virtual Visit via Telephone Note Due to current restrictions/limitations of in-office visits due to the COVID-19 pandemic, this scheduled clinical appointment was converted to a telehealth visit  I connected with [name] on 01/13/20 at 1:44 p.m by telephone and verified that I am speaking with the correct person using two identifiers. I am in my office.  The patient is at home.  Only the patient, [name] (P.T assistant with [facility]) and myself participated in this encounter.  I discussed the limitations, risks, security and privacy concerns of performing an evaluation and management service by telephone and the availability of in person appointments. I also discussed with the patient that there may be a patient responsible charge related to this service. The patient expressed understanding and agreed to proceed.   History of Present Illness: Pt withhistory ofDM2 with retinopathy and neuropathy, HTN, dCHF, formertob dep, HL, CVAwith LT sided weakness, spinal stenosis of lumbar spine,chronic lower extremity edema. Last seen 11/2019. Purpose of today's visit is f/u from ER visit 12/25/2019 and to get approval for continued home physical therapy.  Pt seen in ER with increase LT sided weakness but he tells me the weakness was all over.  Imaging of the head including CT and MRI revealed no acute stroke.  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   [facility]  He has been receiving it twice a week and the plan is to go through until next week.  At that time the therapist will evaluate to see whether he needs more physical therapy.  Patient states he is doing better but not back to base line.  The therapist assistant who was there with him today stated that he still has a little muscle weakness  and a little wobbly on his feet. -He is wanting to know whether this is something different that I can give him for pain.  He thinks his system has gotten used to the gabapentin.  He has seen the podiatrist since last visit with me and had that painful callus on his foot shaved down.    Outpatient Encounter Medications as of 01/13/2020  Medication Sig Note  . acetaminophen (TYLENOL) 500 MG tablet TAKE ONE TABLET BY MOUTH TWICE A DAY AS NEEDED (Patient taking differently: Take 500 mg by mouth 2 (two) times daily as needed. )   . aspirin EC 81 MG tablet Take 1 tablet (81 mg total) by mouth daily.   Marland Kitchen atorvastatin (LIPITOR) 40 MG tablet Take 1.5 tablets (60 mg total) by mouth daily. (Patient not taking: Reported on 12/25/2019)   . blood glucose meter kit and supplies KIT Dispense based on patient and insurance preference. Use once daily as directed. (E11.9) (Patient taking differently: Inject 1 each into the skin See admin instructions. Dispense based on patient and insurance preference. Use once daily as directed. (E11.9)) 12/25/2019: Waiting on new machine.   . Blood Glucose Monitoring Suppl (ACCU-CHEK GUIDE ME) w/Device KIT 1 kit by Does not apply route 3 (three) times daily. Use as instructed to check blood sugar 3 times daily. E11.14 12/25/2019: Waiting on new machine  . clindamycin (CLEOCIN) 300 MG capsule Take 1 capsule (300 mg total) by mouth 3 (three) times daily. (Patient not taking: Reported on 12/25/2019) 07/13/2019: Taking everyday per patient.   . dapagliflozin propanediol (FARXIGA) 10 MG TABS tablet Take  10 mg by mouth daily before breakfast. (Patient not taking: Reported on 12/25/2019)   . DULoxetine (CYMBALTA) 60 MG capsule TAKE 1 CAPSULE (60 MG TOTAL) BY MOUTH DAILY. FOR BACK AND LEG PAINS (Patient not taking: Reported on 12/25/2019)   . furosemide (LASIX) 40 MG tablet TAKE ONE TABLET BY MOUTH DAILY . TAKE EXTRA TABLET FOR TWO DAYS AS NEEDED FOR INCREASE LOWER EXTREMITY SWELLING (Patient taking  differently: Take 40 mg by mouth daily. )   . gabapentin (NEURONTIN) 100 MG capsule TAKE TWO CAPSULES (200 MG TOTAL) BY MOUTH TWO (TWO) TIMES DAILY. (Patient taking differently: Take 200 mg by mouth daily. )   . glipiZIDE (GLUCOTROL XL) 10 MG 24 hr tablet TAKE 1 TABLET (10 MG TOTAL) BY MOUTH DAILY WITH BREAKFAST.   Marland Kitchen glucose blood (ACCU-CHEK GUIDE) test strip Use as instructed to check blood sugar 3 times daily. E11.14 (Patient taking differently: 1 each by Other route See admin instructions. Use as instructed to check blood sugar 3 times daily. E11.14) 12/25/2019: Waiting on new machine  . hydrALAZINE (APRESOLINE) 10 MG tablet TAKE ONE TABLET BY MOUTH TWICE A DAY (Patient taking differently: Take 10 mg by mouth daily. )   . labetalol (NORMODYNE) 100 MG tablet TAKE ONE TABLET BY MOUTH TWICE A DAY (Patient taking differently: Take 100 mg by mouth 2 (two) times daily. )   . Lancets Misc. (ACCU-CHEK SOFTCLIX LANCET DEV) KIT Use as directed (Patient taking differently: 1 each by Other route as directed. )   . Lancets MISC Use as instructed to check blood sugar 3 times daily. E11.14 (Patient taking differently: 1 each by Other route See admin instructions. Use as instructed to check blood sugar 3 times daily. E11.14)   . lisinopril (ZESTRIL) 40 MG tablet TAKE ONE TABLET BY MOUTH DAILY (Patient taking differently: Take 40 mg by mouth daily. )   . loratadine (CLARITIN) 10 MG tablet TAKE ONE TABLET BY MOUTH DAILY AS NEEDED FOR ALLERGIES (Patient taking differently: Take 10 mg by mouth daily as needed for allergies. )   . metFORMIN (GLUCOPHAGE) 1000 MG tablet Take 1 tablet (1,000 mg total) by mouth 2 (two) times daily with a meal.    No facility-administered encounter medications on file as of 01/13/2020.      Observations/Objective:   Chemistry      Component Value Date/Time   NA 142 12/25/2019 0423   NA 145 (H) 07/07/2019 0947   K 4.3 12/25/2019 0423   CL 108 12/25/2019 0423   CO2 23 12/25/2019 0409    BUN 23 12/25/2019 0423   BUN 16 07/07/2019 0947   CREATININE 1.30 (H) 12/25/2019 0423   CREATININE 0.89 06/24/2015 0905      Component Value Date/Time   CALCIUM 9.2 12/25/2019 0409   ALKPHOS 55 12/25/2019 0409   AST 13 (L) 12/25/2019 0409   ALT 10 12/25/2019 0409   BILITOT 0.9 12/25/2019 0409   BILITOT 0.3 04/17/2019 1034     Lab Results  Component Value Date   WBC 7.8 12/25/2019   HGB 13.9 12/25/2019   HCT 41.0 12/25/2019   MCV 87.4 12/25/2019   PLT 260 12/25/2019     Assessment and Plan: 1. Left-sided weakness 2. Gait disturbance -He is benefiting from home physical therapy and I agree for them to continue until he reaches set goal. With his history of strokes.  Continue secondary prevention with good blood pressure and diabetes control.  Continue aspirin and Lipitor -I will have an appointment made for  him to see his neurologist concerning the MRI findings that suggest questionable obstructive hydrocephalus - - Ambulatory referral to Neurology  3. Other chronic pain Continue gabapentin for neuropathy pain.  Not inclined to put him on narcotics  4. Abnormal brain MRI See #1 above - Ambulatory referral to Neurology   Follow Up Instructions: Next month as previously scheduled.   I discussed the assessment and treatment plan with the patient. The patient was provided an opportunity to ask questions and all were answered. The patient agreed with the plan and demonstrated an understanding of the instructions.   The patient was advised to call back or seek an in-person evaluation if the symptoms worsen or if the condition fails to improve as anticipated.  I provided 12 minutes of non-face-to-face time during this encounter.   Karle Plumber, MD

## 2020-01-13 NOTE — Progress Notes (Signed)
Pt states he has numbness in fingertips on b/l hands

## 2020-01-22 ENCOUNTER — Other Ambulatory Visit: Payer: Self-pay | Admitting: *Deleted

## 2020-01-22 NOTE — Patient Outreach (Signed)
[facility]) Care Management  01/22/2020  [name] Apr 13, 1961 [id number]   Call placed to member to follow up on management of diabetes.  Report blood sugar yesterday was 121, he has not checked today.  Confirms he still has in home aides, he is improving in strength, state he will try to start exercising more to improve his condition even more.  He has follow up appointment with PCP tomorrow, transportation secured.  Denies any urgent concerns, advised to contact this care manager with questions. Will follow up within the next month, if remain stable will transition to health coach.   Goals Addressed              This Visit's Progress     I want to get off some of these medications (pt-stated)        CARE PLAN ENTRY (see longtitudinal plan of care for additional care plan information)  Objective:  Lab Results  Component Value Date   HGBA1C 7.3 (H) 12/09/2019    Lab Results  Component Value Date   CREATININE 1.30 (H) 12/25/2019   CREATININE 1.29 (H) 12/25/2019   CREATININE 1.20 11/08/2019     No results found for: EGFR  Current Barriers:   Knowledge Deficits related to basic Diabetes pathophysiology and self care/management  Case Manager Clinical Goal(s):  Over the next 31 days, patient will demonstrate improved adherence to prescribed treatment plan for diabetes self care/management as evidenced by:   Verbalize daily monitoring and recording of CBG within 28 days  Verbalize adherence to ADA/ carb modified diet within the next 28 days  Verbalize exercise 3 days/week  Interventions:   Provided education to patient about basic DM disease process  Reviewed medications with patient and discussed importance of medication adherence  Discussed plans with patient for ongoing care management follow up and provided patient with direct contact information for care management team  Advised patient, providing education and rationale, to check cbg  daily and record, calling MD for findings outside established parameters.    Patient Self Care Activities:   Attends all scheduled provider appointments  Initial goal documentation       [name], RN, MSN [place] Manager [phone]

## 2020-01-23 ENCOUNTER — Other Ambulatory Visit: Payer: Self-pay | Admitting: Internal Medicine

## 2020-01-23 ENCOUNTER — Ambulatory Visit: Payer: Medicare Other | Admitting: Internal Medicine

## 2020-01-23 DIAGNOSIS — J309 Allergic rhinitis, unspecified: Secondary | ICD-10-CM

## 2020-01-23 DIAGNOSIS — I10 Essential (primary) hypertension: Secondary | ICD-10-CM

## 2020-01-23 DIAGNOSIS — IMO0002 Reserved for concepts with insufficient information to code with codable children: Secondary | ICD-10-CM

## 2020-01-23 NOTE — Telephone Encounter (Signed)
Requested medication (s) are due for refill today: Yes  Requested medication (s) are on the active medication list: Yes  Last refill:  07/03/19  Future visit scheduled: Yes  Notes to clinic:  Audry Pili is not in your practice.    Requested Prescriptions  Pending Prescriptions Disp Refills   ASPIR-LOW 81 MG EC tablet [Pharmacy Med Name: ASPIRIN-LOW 81MG TAB] 30 tablet 3    Sig: TAKE 1 TABLET (81 MG TOTAL) BY MOUTH DAILY.      Analgesics:  NSAIDS - aspirin Passed - 01/23/2020  3:28 PM      Passed - Patient is not pregnant      Passed - Valid encounter within last 12 months    Recent Outpatient Visits           1 week ago Left-sided weakness   [place], MD   1 month ago Essential hypertension   [place], Deborah B, MD   2 months ago Uncontrolled type 2 diabetes mellitus with peripheral neuropathy [facility])   [place] Ladell Pier, MD   4 months ago Left-sided weakness   [place], Deborah B, MD   6 months ago Diabetes mellitus type 2 with complications [facility])   [place], MD       Future Appointments             In 2 weeks Ladell Pier, MD [place]   In 1 month [place], [place], [facility], [facility]             Signed Prescriptions Disp Refills   metFORMIN (GLUCOPHAGE) 1000 MG tablet 180 tablet 1    Sig: TAKE ONE TABLET BY MOUTH TWICE A DAY WITH A MEAL.      Endocrinology:  Diabetes - Biguanides Failed - 01/23/2020  3:28 PM      Failed - Cr in normal range and within 360 days    Creat  Date Value Ref Range Status  06/24/2015 0.89 0.70 - 1.33 mg/dL Final   Creatinine, Ser  Date Value Ref Range Status  12/25/2019 1.30 (H) 0.61 - 1.24 mg/dL Final   Creatinine, Urine  Date Value  Ref Range Status  03/15/2015 154.9 mg/dL Final    Comment:    No reference range established.          Passed - HBA1C is between 0 and 7.9 and within 180 days    HbA1c, POC (controlled diabetic range)  Date Value Ref Range Status  09/24/2018 7.5 (A) 0.0 - 7.0 % Final   Hgb A1c MFr Bld  Date Value Ref Range Status  12/09/2019 7.3 (H) 4.8 - 5.6 % Final    Comment:             Prediabetes: 5.7 - 6.4          Diabetes: >6.4          Glycemic control for adults with diabetes: <7.0           Passed - eGFR in normal range and within 360 days    GFR calc Af Amer  Date Value Ref Range Status  12/25/2019 >60 >60 mL/min Final   GFR calc non Af Amer  Date Value Ref Range Status  12/25/2019 59 (L) >60 mL/min Final  Passed - Valid encounter within last 6 months    Recent Outpatient Visits           1 week ago Left-sided weakness   [place], MD   1 month ago Essential hypertension   [place], MD   2 months ago Uncontrolled type 2 diabetes mellitus with peripheral neuropathy [facility])   [place], MD   4 months ago Left-sided weakness   [place], MD   6 months ago Diabetes mellitus type 2 with complications [facility])   [place], MD       Future Appointments             In 2 weeks Ladell Pier, MD [place]   In 1 month [place], [place], [facility], [facility]

## 2020-01-27 ENCOUNTER — Telehealth: Payer: Self-pay | Admitting: Internal Medicine

## 2020-01-27 NOTE — Telephone Encounter (Signed)
Please advise.  Copied from [place] [id number]. Topic: General - Other >> Jan 27, 2020  8:31 AM Sheran Luz wrote: Reuben Likes, with [facility], states that patient will have to be discharged from [facility] due to not having a face to face encounter in a timely manner. She states that [facility] can be reordered after the 22nd of July when patient has follow up appointment with Dr. Wynetta Emery.

## 2020-01-28 NOTE — Telephone Encounter (Signed)
Returned [place] call and per Reuben Likes pt was scheduled for a visit with pcp for medicare but it was canceled. I made shonda aware that is was canceled due to pt having a in person visit on 7/22. I mad Reuben Likes aware that I called pt and asked what appointment was for and pt didn't even know. Per shonda they discharged pt due to not being able to bill, but once we see pt again on 7/22 we can reorder home health for pt

## 2020-01-30 NOTE — Telephone Encounter (Signed)
Call received from [facility]. She stated that she has now seen the office visit with Dr Wynetta Emery 12/09/2019.  They will review the note and determine if the patient's services can be resumed. She will call this CM back next week with a determination.

## 2020-01-30 NOTE — Telephone Encounter (Signed)
Call placed to [facility] regarding discontinuation of the home health services. Message left with call back requested to this CM # [phone].  Patient had a face to face encounter with Dr Wynetta Emery 12/09/2019 and a televisit encounter 01/13/2020.  Need to inquire why the services were discontinued

## 2020-02-01 ENCOUNTER — Emergency Department (HOSPITAL_COMMUNITY): Payer: Medicare Other

## 2020-02-01 ENCOUNTER — Other Ambulatory Visit: Payer: Self-pay

## 2020-02-01 ENCOUNTER — Emergency Department (HOSPITAL_COMMUNITY)
Admission: EM | Admit: 2020-02-01 | Discharge: 2020-02-01 | Disposition: A | Discharge status: Home / Self Care | Payer: Medicare Other | Attending: Emergency Medicine | Admitting: Emergency Medicine

## 2020-02-01 ENCOUNTER — Encounter (HOSPITAL_COMMUNITY): Payer: Self-pay | Admitting: Emergency Medicine

## 2020-02-01 DIAGNOSIS — R531 Weakness: Secondary | ICD-10-CM

## 2020-02-01 DIAGNOSIS — Z87891 Personal history of nicotine dependence: Secondary | ICD-10-CM | POA: Diagnosis not present

## 2020-02-01 DIAGNOSIS — Z7401 Bed confinement status: Secondary | ICD-10-CM | POA: Diagnosis not present

## 2020-02-01 DIAGNOSIS — E1122 Type 2 diabetes mellitus with diabetic chronic kidney disease: Secondary | ICD-10-CM | POA: Insufficient documentation

## 2020-02-01 DIAGNOSIS — Z7984 Long term (current) use of oral hypoglycemic drugs: Secondary | ICD-10-CM | POA: Insufficient documentation

## 2020-02-01 DIAGNOSIS — M542 Cervicalgia: Secondary | ICD-10-CM | POA: Diagnosis not present

## 2020-02-01 DIAGNOSIS — N182 Chronic kidney disease, stage 2 (mild): Secondary | ICD-10-CM | POA: Insufficient documentation

## 2020-02-01 DIAGNOSIS — F8081 Childhood onset fluency disorder: Secondary | ICD-10-CM | POA: Diagnosis not present

## 2020-02-01 DIAGNOSIS — M2550 Pain in unspecified joint: Secondary | ICD-10-CM | POA: Diagnosis not present

## 2020-02-01 DIAGNOSIS — R0602 Shortness of breath: Secondary | ICD-10-CM | POA: Diagnosis not present

## 2020-02-01 DIAGNOSIS — I129 Hypertensive chronic kidney disease with stage 1 through stage 4 chronic kidney disease, or unspecified chronic kidney disease: Secondary | ICD-10-CM | POA: Insufficient documentation

## 2020-02-01 DIAGNOSIS — I959 Hypotension, unspecified: Secondary | ICD-10-CM | POA: Diagnosis not present

## 2020-02-01 DIAGNOSIS — G459 Transient cerebral ischemic attack, unspecified: Secondary | ICD-10-CM | POA: Diagnosis not present

## 2020-02-01 DIAGNOSIS — I10 Essential (primary) hypertension: Secondary | ICD-10-CM | POA: Diagnosis not present

## 2020-02-01 DIAGNOSIS — I4510 Unspecified right bundle-branch block: Secondary | ICD-10-CM | POA: Diagnosis not present

## 2020-02-01 LAB — CBC
HCT: 42.7 % (ref 39.0–52.0)
Hemoglobin: 13.4 g/dL (ref 13.0–17.0)
MCH: 27.7 pg (ref 26.0–34.0)
MCHC: 31.4 g/dL (ref 30.0–36.0)
MCV: 88.2 fL (ref 80.0–100.0)
Platelets: 239 10*3/uL (ref 150–400)
RBC: 4.84 MIL/uL (ref 4.22–5.81)
RDW: 14.4 % (ref 11.5–15.5)
WBC: 10.7 10*3/uL — ABNORMAL HIGH (ref 4.0–10.5)
nRBC: 0 % (ref 0.0–0.2)

## 2020-02-01 LAB — URINALYSIS, ROUTINE W REFLEX MICROSCOPIC
Bacteria, UA: NONE SEEN
Bilirubin Urine: NEGATIVE
Glucose, UA: >=500 mg/dL — AB
Ketones, ur: NEGATIVE mg/dL
Leukocytes,Ua: NEGATIVE
Nitrite: NEGATIVE
Protein, ur: NEGATIVE mg/dL
Specific Gravity, Urine: 1.013 (ref 1.005–1.030)
pH: 5 (ref 5.0–8.0)

## 2020-02-01 LAB — BASIC METABOLIC PANEL
Anion gap: 9 (ref 5–15)
BUN: 16 mg/dL (ref 8–23)
CO2: 25 mmol/L (ref 22–32)
Calcium: 9.4 mg/dL (ref 8.9–10.3)
Chloride: 109 mmol/L (ref 98–111)
Creatinine, Ser: 1.21 mg/dL (ref 0.61–1.24)
GFR calc Af Amer: >60 mL/min (ref >60)
GFR calc non Af Amer: >60 mL/min (ref >60)
Glucose, Bld: 165 mg/dL — ABNORMAL HIGH (ref 70–99)
Potassium: 4 mmol/L (ref 3.5–5.1)
Sodium: 143 mmol/L (ref 135–145)

## 2020-02-01 LAB — TROPONIN I (HIGH SENSITIVITY): Troponin I (High Sensitivity): 5 ng/L (ref <18)

## 2020-02-01 MED ORDER — SODIUM CHLORIDE 0.9 % IV BOLUS
500.0000 mL | Freq: Once | INTRAVENOUS | Status: AC
  Administered 2020-02-01: 500 mL via INTRAVENOUS

## 2020-02-01 MED ORDER — SODIUM CHLORIDE 0.9% FLUSH: 3.0000 mL | Freq: Once | INTRAVENOUS | Status: DC

## 2020-02-01 MED ORDER — LORAZEPAM 2 MG/ML IJ SOLN
1.0000 mg | Freq: Once | INTRAMUSCULAR | Status: AC
  Administered 2020-02-01: 1 mg via INTRAVENOUS
  Filled 2020-02-01: qty 1

## 2020-02-01 NOTE — ED Triage Notes (Addendum)
Pt to triage via [facility] from home.  Reports neck pain, SOB, and new onset stuttering this morning.  Pt has history of stroke with L sided deficits.  Pt denies increase in L sided weakness.  Pt screened at the bridge by Dr. Ralene Bathe prior to coming to triage.  CP/SOB protocol initiated per Dr. Ralene Bathe.

## 2020-02-01 NOTE — ED Notes (Signed)
Patient verbalizes understanding of discharge instructions. Opportunity for questioning and answers were provided. Armband removed by staff, pt discharged from ED. Pt left with PTAR ? ?

## 2020-02-01 NOTE — Discharge Instructions (Signed)
You were seen in the emergency department today with weakness, neck pain, shortness of breath, stuttering symptoms.  Please call your neurologist and primary care doctor first thing tomorrow morning to discuss your symptoms and arrange the next available follow-up appointments.  Return to the emergency department immediately if your symptoms suddenly worsen.

## 2020-02-01 NOTE — ED Provider Notes (Signed)
Emergency Department Provider Note   I have reviewed the triage vital signs and the nursing notes.   HISTORY  Chief Complaint Shortness of Breath and Neck Pain   HPI [name] is a 59 y.o. male with past medical history reviewed below presents to the emergency department with generalized weakness, stuttering, neck pain, shortness of breath.  Patient was last normal yesterday before going to bed.  He woke up this morning with some neck discomfort and mild shortness of breath.  Shortness of breath symptoms have resolved.  He never had chest pain.  At approximately 8 AM this morning he developed stuttering.  He states he had a similar episode in April and came to the emergency department then as well.  He arrived by EMS and initial blood work was drawn.  Patient denies any fevers or headaches with his neck pain.  No injuries.  He has left-sided weakness at baseline which is unchanged.    Past Medical History:  Diagnosis Date  . Alcohol abuse    11-15-2017  per pt last alcohol Dec 2018  . BPH with obstruction/lower urinary tract symptoms   . Chronic arm pain   . Chronic pain    arms, leg, back  . CKD (chronic kidney disease), stage II   . Cocaine abuse ([place])    11-15-2017  per pt last used March 2019  . Congenital hydrocephalus, unspecified (HCC)    slow progression  . Diabetic retinopathy of both eyes ([place])   . Diastolic CHF, chronic ([place]) 07/2017  . Gait instability    multifactorial -- slow worsening congenital hydrocephalus, peripheral neuropathy, left C5-6 cord lesion  . Hematuria   . History of acute pulmonary edema 07/2017  . History of spinal cord injury 01/02/2014   pt fell, caused spinal cord contusion at C5-6--  residual left side weakness    . 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 History of TIA (transient ischemic attack) and stroke    hx cva 12-20-2010 and  TIA 02-22-2011--- no residual's  from cva or tia  . Hyperlipidemia   . Hypertension    followed by pcp  . 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 Lesion of bladder   . Peripheral edema    chronic LUE  . Peripheral neuropathy   . Type 2 diabetes mellitus ([place])    followed by dr Karle Plumber--  last A1c 6.8 on 11-06-2017  . Weak urinary stream     Patient Active Problem List   Diagnosis Date Noted  . History of spinal cord injury 04/17/2019  . TIA (transient ischemic attack) 03/05/2018  . Benign essential HTN 03/05/2018  . Sensorineural hearing loss of both ears 12/04/2017  . Mild nonproliferative diabetic retinopathy ([place]) 01/10/2016  . Hypertensive retinopathy of both eyes 01/10/2016  . Spinal stenosis of lumbar region 07/15/2015  . Abnormality of gait 06/07/2015  . Hydrocephalus ([place]) 06/07/2015  . Peripheral neuropathy 04/26/2015  . Constipation 04/26/2015  . Diabetes mellitus type 2 with complications ([place]) 53/97/6734  . Decreased hearing of both ears 03/15/2015  . Tinea pedis 03/15/2015  . Pre-ulcerative calluses 05/22/2014  . Cocaine abuse ([place]) 12/28/2013  . Left-sided weakness 12/26/2013  . Hyperlipidemia associated with type 2 diabetes mellitus ([place]) 04/01/2007  . Class 2 severe obesity due to excess calories with serious comorbidity and body mass index (BMI) of 36.0 to 36.9 in adult ([place]) 03/28/2007  .  Essential hypertension 03/28/2007    Past Surgical History:  Procedure Laterality Date  . CARDIAC CATHETERIZATION  12/01/2008   dr hochrein   abnormal stress myoview:  mild coronary plaque, normal LVF  . CARDIAC CATHETERIZATION  04/29/2011   dr [place]   in setting ECG with new ST elevation & severe hypertensive:  nonobstructive atherosclerotic CAD, normal LVF (30% mRCA)   . CARDIOVASCULAR STRESS TEST  08/15/2010   Low nuclear study w/ no evidence ishemia/  ef 42% with lateral and apical hypokinesis  . CYSTOSCOPY WITH BIOPSY N/A 11/20/2017   Procedure: CYSTOSCOPY  WITH BIOPSY AND FULGURATION;  Surgeon: [name], MD;  Location: [facility];  Service: Urology;  Laterality: N/A;  . INCISION AND DRAINAGE RIGHT DISTAL MEDIAL THIGH HEMATOMA  04-14-2005   dr Ninfa Linden  . left arm skin graft  1976   injury  . LEFT HEART CATHETERIZATION WITH CORONARY ANGIOGRAM Bilateral 12/26/2013   Procedure: LEFT HEART CATHETERIZATION WITH CORONARY ANGIOGRAM;  Surgeon: Burnell Blanks, MD;  Location: [facility] CATH LAB;  Service: Cardiovascular;  Laterality: Bilateral;  STEMI, in setting cocaine/ alcohol :  mid disease in the RCA (20%), moderate disease in the small caliber intermediate branch (distal 50-60%), normal LVSF (ef 55-60%)  . TOOTH EXTRACTION N/A 05/16/2018   Procedure: DENTAL RESTORATION/EXTRACTIONS WITH ALVEO;  Surgeon: Diona Browner, DDS;  Location: [facility];  Service: Oral Surgery;  Laterality: N/A;  . TRANSTHORACIC ECHOCARDIOGRAM  08/05/2017   moderate LVH,  ef 55%,  grade 1 diastolic dysfunction/  mild LAE/  trivial Tr    Allergies Patient has no known allergies.  Family History  Problem Relation Age of Onset  . Hypertension Mother   . Diabetes Mother   . Cancer Mother        breast cancer   . Hypertension Father   . Heart disease Father   . Stroke Father   . Hypertension Sister     Social History Social History   Tobacco Use  . Smoking status: Former Smoker    Years: 45.00    Types: Cigarettes    Quit date: 03/03/2018    Years since quitting: 1.9  . Smokeless tobacco: Never Used  . Tobacco comment: 11-15-2017  per pt 1pp3days  Vaping Use  . Vaping Use: Never assessed  Substance Use Topics  . Alcohol use: Not Currently    Alcohol/week: 0.0 standard drinks    Comment: 11-15-2017  per pt last alcohol 12/ 2018  alcohol abuse  . Drug use: Not Currently    Types: "Crack" cocaine, Cocaine    Comment: 11-15-2017  per pt last used March 2019    Review of Systems  Constitutional: No fever/chills. Generalized  weakness.  Eyes: No visual changes. ENT: No sore throat. Cardiovascular: Denies chest pain. Respiratory: Positive shortness of breath. Gastrointestinal: No abdominal pain.  No nausea, no vomiting.  No diarrhea.  No constipation. Genitourinary: Negative for dysuria. Musculoskeletal: Negative for back pain. Positive neck pain.  Skin: Negative for rash. Neurological: Negative for headaches, focal weakness or numbness. Positive stuttering.   10-point ROS otherwise negative.  ____________________________________________   PHYSICAL EXAM:  VITAL SIGNS: ED Triage Vitals  Enc Vitals Group     BP 02/01/20 1007 136/73     Pulse Rate 02/01/20 1007 68     Resp 02/01/20 1007 20     Temp 02/01/20 1007 98.2 F (36.8 C)     Temp Source 02/01/20 1007 Oral     SpO2 02/01/20 1007 99 %  Weight 02/01/20 1007 247 lb (112 kg)     Height 02/01/20 1007 6\' 3"  (1.905 m)   Constitutional: Alert and oriented. Well appearing and in no acute distress. Eyes: Conjunctivae are normal.  Head: Atraumatic. Nose: No congestion/rhinnorhea. Mouth/Throat: Mucous membranes are moist.  Neck: No stridor.  No cervical spine tenderness to palpation. Cardiovascular: Normal rate, regular rhythm. Good peripheral circulation. Grossly normal heart sounds.   Respiratory: Normal respiratory effort.  No retractions. Lungs CTAB. Gastrointestinal: Soft and nontender. No distention.  Musculoskeletal: No lower extremity tenderness nor edema. No gross deformities of extremities. Neurologic: Occasional stuttering. No other dysarthria.  No facial asymmetry.  4+/5 strength in the left upper extremity with similar weakness in the left lower extremity.  Normal strength on the right with equal sensation bilaterally. Skin:  Skin is warm, dry and intact. No rash noted.   ____________________________________________   LABS (all labs ordered are listed, but only abnormal results are displayed)  Labs Reviewed  BASIC METABOLIC PANEL  - Abnormal; Notable for the following components:      Result Value   Glucose, Bld 165 (*)    All other components within normal limits  CBC - Abnormal; Notable for the following components:   WBC 10.7 (*)    All other components within normal limits  URINALYSIS, ROUTINE W REFLEX MICROSCOPIC - Abnormal; Notable for the following components:   Color, Urine STRAW (*)    Glucose, UA >=500 (*)    Hgb urine dipstick SMALL (*)    All other components within normal limits  URINE CULTURE  TROPONIN I (HIGH SENSITIVITY)   ____________________________________________  EKG   EKG Interpretation  Date/Time:  Sunday February 01 2020 14:33:07 EDT Ventricular Rate:  68 PR Interval:  172 QRS Duration: 140 QT Interval:  407 QTC Calculation: 433 R Axis:   -70 Text Interpretation: Sinus rhythm RBBB and LAFB Left ventricular hypertrophy No STEMI Confirmed by Nanda Quinton [phone]) on 02/01/2020 3:04:04 PM       ____________________________________________  RADIOLOGY  CXR results reviewed.  ____________________________________________   PROCEDURES  Procedure(s) performed:   Procedures  None  ____________________________________________   INITIAL IMPRESSION / ASSESSMENT AND PLAN / ED COURSE  Pertinent labs & imaging results that were available during my care of the patient were reviewed by me and considered in my medical decision making (see chart for details).   Patient presents emergency department for evaluation of multiple complaints including neck pain, shortness of breath, stuttering.  He has got some generalized weakness.  Left-sided weakness is baseline from prior stroke and is not worse than normal per patient and in review of prior notes/exams.  Patient does have occasional stuttering.  I reviewed the ED visit from April 2021 with similar stuttering type symptoms.  At that time patient had CT imaging of the head followed by MRI which were both negative and symptoms resolved with IV  fluids and Ativan.  Will give similar therapy here and send UA.  Blood work reviewed with no acute findings.  EKG interpreted as above with no acute ischemic change.  Labs and imaging reviewed. Patient's symptoms improved in the ED. Stuttering has resolved. Patient to call PCP and neurology in the AM for f/u appointment.  ____________________________________________  FINAL CLINICAL IMPRESSION(S) / ED DIAGNOSES  Final diagnoses:  Generalized weakness  Stuttering    MEDICATIONS GIVEN DURING THIS VISIT:  Medications  LORazepam (ATIVAN) injection 1 mg (1 mg Intravenous Given 02/01/20 1614)  sodium chloride 0.9 % bolus 500 mL (0  mLs Intravenous Stopped 02/01/20 1738)    Note:  This document was prepared using Dragon voice recognition software and may include unintentional dictation errors.  Nanda Quinton, MD, [facility]    Timara Loma, [facility], MD 02/04/20 [phone]

## 2020-02-02 LAB — URINE CULTURE: Culture: NO GROWTH

## 2020-02-03 NOTE — Telephone Encounter (Signed)
Please f/u   Copied from [place] [phone]. Topic: General - Other >> Jan 27, 2020  8:31 AM Sheran Luz wrote: Reuben Likes, with [facility], states that patient will have to be discharged from [facility] due to not having a face to face encounter in a timely manner. She states that [facility] can be reordered after the 22nd of July when patient has follow up appointment with Dr. Wynetta Emery. >> Feb 03, 2020  9:25 AM Alanda Slim E wrote: Message for [facility] /[facility] called and Their team looked at the May 18th visit note / and it will not work for the phase two phase / due to it being about hypertension  And no link to the need of physical therapy   You can call [phone] Cell phone for direct reach

## 2020-02-03 NOTE — Telephone Encounter (Signed)
Will forward to Jane 

## 2020-02-05 ENCOUNTER — Inpatient Hospital Stay: Payer: Medicare Other | Admitting: Internal Medicine

## 2020-02-10 ENCOUNTER — Other Ambulatory Visit: Payer: Self-pay | Admitting: Family Medicine

## 2020-02-10 ENCOUNTER — Other Ambulatory Visit: Payer: Self-pay | Admitting: Internal Medicine

## 2020-02-10 DIAGNOSIS — I10 Essential (primary) hypertension: Secondary | ICD-10-CM

## 2020-02-10 DIAGNOSIS — IMO0002 Reserved for concepts with insufficient information to code with codable children: Secondary | ICD-10-CM

## 2020-02-10 DIAGNOSIS — J309 Allergic rhinitis, unspecified: Secondary | ICD-10-CM

## 2020-02-12 ENCOUNTER — Encounter: Payer: Self-pay | Admitting: Internal Medicine

## 2020-02-12 ENCOUNTER — Ambulatory Visit: Payer: Medicare Other | Attending: Internal Medicine | Admitting: Internal Medicine

## 2020-02-12 ENCOUNTER — Other Ambulatory Visit: Payer: Self-pay

## 2020-02-12 VITALS — BP 123/69 | HR 76 | Resp 16 | Wt 245.2 lb

## 2020-02-12 DIAGNOSIS — Z8249 Family history of ischemic heart disease and other diseases of the circulatory system: Secondary | ICD-10-CM | POA: Insufficient documentation

## 2020-02-12 DIAGNOSIS — Z7982 Long term (current) use of aspirin: Secondary | ICD-10-CM | POA: Insufficient documentation

## 2020-02-12 DIAGNOSIS — F17200 Nicotine dependence, unspecified, uncomplicated: Secondary | ICD-10-CM

## 2020-02-12 DIAGNOSIS — R269 Unspecified abnormalities of gait and mobility: Secondary | ICD-10-CM | POA: Diagnosis not present

## 2020-02-12 DIAGNOSIS — Z7984 Long term (current) use of oral hypoglycemic drugs: Secondary | ICD-10-CM | POA: Insufficient documentation

## 2020-02-12 DIAGNOSIS — I2510 Atherosclerotic heart disease of native coronary artery without angina pectoris: Secondary | ICD-10-CM | POA: Diagnosis not present

## 2020-02-12 DIAGNOSIS — I6930 Unspecified sequelae of cerebral infarction: Secondary | ICD-10-CM

## 2020-02-12 DIAGNOSIS — R531 Weakness: Secondary | ICD-10-CM

## 2020-02-12 DIAGNOSIS — E1165 Type 2 diabetes mellitus with hyperglycemia: Secondary | ICD-10-CM

## 2020-02-12 DIAGNOSIS — J309 Allergic rhinitis, unspecified: Secondary | ICD-10-CM | POA: Diagnosis not present

## 2020-02-12 DIAGNOSIS — I110 Hypertensive heart disease with heart failure: Secondary | ICD-10-CM | POA: Insufficient documentation

## 2020-02-12 DIAGNOSIS — E785 Hyperlipidemia, unspecified: Secondary | ICD-10-CM | POA: Diagnosis not present

## 2020-02-12 DIAGNOSIS — K5900 Constipation, unspecified: Secondary | ICD-10-CM | POA: Diagnosis not present

## 2020-02-12 DIAGNOSIS — I5030 Unspecified diastolic (congestive) heart failure: Secondary | ICD-10-CM | POA: Diagnosis not present

## 2020-02-12 DIAGNOSIS — E1142 Type 2 diabetes mellitus with diabetic polyneuropathy: Secondary | ICD-10-CM

## 2020-02-12 DIAGNOSIS — E6609 Other obesity due to excess calories: Secondary | ICD-10-CM

## 2020-02-12 DIAGNOSIS — D519 Vitamin B12 deficiency anemia, unspecified: Secondary | ICD-10-CM | POA: Diagnosis not present

## 2020-02-12 DIAGNOSIS — G6289 Other specified polyneuropathies: Secondary | ICD-10-CM | POA: Diagnosis not present

## 2020-02-12 DIAGNOSIS — Z87891 Personal history of nicotine dependence: Secondary | ICD-10-CM | POA: Insufficient documentation

## 2020-02-12 DIAGNOSIS — E113299 Type 2 diabetes mellitus with mild nonproliferative diabetic retinopathy without macular edema, unspecified eye: Secondary | ICD-10-CM | POA: Insufficient documentation

## 2020-02-12 DIAGNOSIS — Z833 Family history of diabetes mellitus: Secondary | ICD-10-CM | POA: Insufficient documentation

## 2020-02-12 DIAGNOSIS — I69354 Hemiplegia and hemiparesis following cerebral infarction affecting left non-dominant side: Secondary | ICD-10-CM | POA: Insufficient documentation

## 2020-02-12 DIAGNOSIS — Z6830 Body mass index (BMI) 30.0-30.9, adult: Secondary | ICD-10-CM | POA: Diagnosis not present

## 2020-02-12 DIAGNOSIS — H903 Sensorineural hearing loss, bilateral: Secondary | ICD-10-CM | POA: Insufficient documentation

## 2020-02-12 DIAGNOSIS — Z560 Unemployment, unspecified: Secondary | ICD-10-CM | POA: Diagnosis not present

## 2020-02-12 DIAGNOSIS — M48061 Spinal stenosis, lumbar region without neurogenic claudication: Secondary | ICD-10-CM | POA: Insufficient documentation

## 2020-02-12 DIAGNOSIS — I10 Essential (primary) hypertension: Secondary | ICD-10-CM

## 2020-02-12 DIAGNOSIS — Z79899 Other long term (current) drug therapy: Secondary | ICD-10-CM | POA: Insufficient documentation

## 2020-02-12 DIAGNOSIS — I252 Old myocardial infarction: Secondary | ICD-10-CM | POA: Diagnosis not present

## 2020-02-12 LAB — GLUCOSE, POCT (MANUAL RESULT ENTRY): POC Glucose: 149 mg/dl — AB (ref 70–99)

## 2020-02-12 MED ORDER — LORATADINE 10 MG PO TABS: ORAL_TABLET | ORAL | 5 refills | Status: DC

## 2020-02-12 MED ORDER — LISINOPRIL 40 MG PO TABS: 40.0000 mg | ORAL_TABLET | Freq: Every day | ORAL | 6 refills | Status: DC

## 2020-02-12 MED ORDER — DAPAGLIFLOZIN PROPANEDIOL 10 MG PO TABS: 10.0000 mg | ORAL_TABLET | Freq: Every day | ORAL | 6 refills | Status: DC

## 2020-02-12 MED ORDER — GABAPENTIN 300 MG PO CAPS: 300.0000 mg | ORAL_CAPSULE | Freq: Two times a day (BID) | ORAL | 6 refills | Status: DC

## 2020-02-12 MED ORDER — GLIPIZIDE ER 10 MG PO TB24: 10.0000 mg | ORAL_TABLET | Freq: Every day | ORAL | 6 refills | Status: DC

## 2020-02-12 NOTE — Patient Instructions (Signed)
Increase gabapentin to 300 mg twice a day.  I will resubmit the referral for home physical therapy and Occupational Therapy with [facility].

## 2020-02-12 NOTE — Progress Notes (Signed)
Patient ID: [name], male    DOB: [birth year]  MRN: [id number]  CC: Diabetes and Hypertension   Subjective: [name] is a 59 y.o. male who presents for chronic ds management His concerns today include:  Pt withhistory ofDM2 with retinopathy and neuropathy, HTN, dCHF, formertob dep, HL, CVAwith LT sided weakness, spinal stenosis of lumbar spine,chronic lower extremity edema.  Seen in ER earlier this mth with shortness of breath, neck pain, weakness and stuttering.  He is still complaining of weakness in his legs but the other symptoms have resolved.  Complains of weakness in his hands and numbness of the fingers.  He has problems holding objects.  He ambulates with his Rollator walker.  He has not had any falls but "close calls."  He was having some [facility] but they had to terminate after a few weeks since patient did not have a face-to-face visit with me.  He would like to resume some home physical therapy and Occupational Therapy to strengthen his hands  HYPERTENSION Currently taking: see medication list Med Adherence: _0  Yes    _1  No Medication side effects: _2  Yes    _3  No Adherence with salt restriction: _4  Yes    _5  No Home Monitoring?: _6  Yes    _7  No Monitoring Frequency: every morning.   Home BP results range: SBP 130s/70s SOB? _8  Yes    _9  No Chest Pain?: _10  Yes    _11  No Leg swelling?: _12  Yes    _13  No Headaches?: _14  Yes    _15  No Dizziness? _16  Yes    _17  No Comments:   Tob Dep: Had quit completely.  Now he tells me that "I smoke one every now and again."  States that he does not purchase cigarettes but if he has someone over to his house who smokes, he would ask them for cigarette.  DIABETES TYPE 2 Last A1C:   Results for orders placed or performed in visit on 02/12/20  POCT glucose (manual entry)  Result Value Ref Range   POC Glucose 149 (A) 70 - 99 mg/dl    Lab Results  Component Value Date   HGBA1C  7.3 (H) 12/09/2019   Med Adherence:  _18  Yes    _19  No Medication side effects:  _20  Yes    _21  No Home Monitoring?  _22  Yes  -2-3 x a wk Home glucose results range: been good Diet Adherence: _23  Yes.  Occasional fried foods. Drinks KoolAID and sodas Exercise: _24  Yes    _25  No Hypoglycemic episodes?: _26  Yes    _27  No Numbness of the feet? _28  Yes    _29  No Retinopathy hx? _30  Yes    _31  No Last eye exam: Up-to-date. Comments:   HM:  Had COVID-19 vaccine but does not have card with him today for me to update his health maintenance.  Patient Active Problem List   Diagnosis Date Noted   History of spinal cord injury 04/17/2019   TIA (transient ischemic attack) 03/05/2018   Benign essential HTN 03/05/2018   Sensorineural hearing loss of both ears 12/04/2017   Mild nonproliferative diabetic retinopathy ([place]) 01/10/2016   Hypertensive retinopathy of both eyes 01/10/2016   Spinal stenosis of lumbar region 07/15/2015   Abnormality of gait 06/07/2015   Hydrocephalus ([place]) 06/07/2015   Peripheral neuropathy 04/26/2015   Constipation 04/26/2015   Diabetes mellitus type 2 with complications ([place]) 62/70/3500   Decreased hearing of both ears 03/15/2015  Tinea pedis 03/15/2015   Pre-ulcerative calluses 05/22/2014   Cocaine abuse ([place]) 12/28/2013   Left-sided weakness 12/26/2013   Hyperlipidemia associated with type 2 diabetes mellitus ([place]) 04/01/2007   Class 2 severe obesity due to excess calories with serious comorbidity and body mass index (BMI) of 36.0 to 36.9 in adult [facility]) 03/28/2007   Essential hypertension 03/28/2007     Current Outpatient Medications on File Prior to Visit  Medication Sig Dispense Refill   acetaminophen (TYLENOL) 500 MG tablet TAKE ONE TABLET BY MOUTH TWICE A DAY AS NEEDED (Patient taking differently: Take 500 mg by mouth 2 (two) times daily as needed. ) 60 tablet 0   ASPIR-LOW 81 MG EC tablet TAKE 1 TABLET (81 MG TOTAL) BY MOUTH DAILY. 30  tablet 3   atorvastatin (LIPITOR) 40 MG tablet TAKE 1.5 TABLETS BY MOUTH DAILY 45 tablet 2   blood glucose meter kit and supplies KIT Dispense based on patient and insurance preference. Use once daily as directed. (E11.9) (Patient taking differently: Inject 1 each into the skin See admin instructions. Dispense based on patient and insurance preference. Use once daily as directed. (E11.9)) 1 each 0   Blood Glucose Monitoring Suppl (ACCU-CHEK GUIDE ME) w/Device KIT 1 kit by Does not apply route 3 (three) times daily. Use as instructed to check blood sugar 3 times daily. E11.14 1 kit 0   clindamycin (CLEOCIN) 300 MG capsule Take 1 capsule (300 mg total) by mouth 3 (three) times daily. (Patient not taking: Reported on 12/25/2019) 21 capsule 0   dapagliflozin propanediol (FARXIGA) 10 MG TABS tablet Take 10 mg by mouth daily before breakfast. (Patient not taking: Reported on 12/25/2019) 30 tablet 6   DULoxetine (CYMBALTA) 60 MG capsule TAKE ONE CAPSULE BY MOUTH DAILY FOR LEG AND BACK PAINS 30 capsule 1   furosemide (LASIX) 40 MG tablet TAKE ONE TABLET BY MOUTH DAILY . TAKE EXTRA TABLET FOR TWO DAYS AS NEEDED FOR INCREASE LOWER EXTREMITY SWELLING 40 tablet 2   gabapentin (NEURONTIN) 100 MG capsule TAKE TWO CAPSULES (200 MG TOTAL) BY MOUTH TWO (TWO) TIMES DAILY. (Patient taking differently: Take 200 mg by mouth daily. ) 120 capsule 5   glipiZIDE (GLUCOTROL XL) 10 MG 24 hr tablet TAKE ONE TABLET BY MOUTH DAILY WITH BREAKFAST 30 tablet 2   glucose blood (ACCU-CHEK GUIDE) test strip Use as instructed to check blood sugar 3 times daily. E11.14 (Patient taking differently: 1 each by Other route See admin instructions. Use as instructed to check blood sugar 3 times daily. E11.14) 100 each 11   hydrALAZINE (APRESOLINE) 10 MG tablet TAKE ONE TABLET BY MOUTH TWICE A DAY 60 tablet 2   labetalol (NORMODYNE) 100 MG tablet TAKE ONE TABLET BY MOUTH TWICE A DAY 60 tablet 2   Lancets Misc. (ACCU-CHEK SOFTCLIX LANCET  DEV) KIT Use as directed (Patient taking differently: 1 each by Other route as directed. ) 1 kit 0   Lancets MISC Use as instructed to check blood sugar 3 times daily. E11.14 (Patient taking differently: 1 each by Other route See admin instructions. Use as instructed to check blood sugar 3 times daily. E11.14) 100 each 11   lisinopril (ZESTRIL) 40 MG tablet TAKE ONE TABLET BY MOUTH DAILY 30 tablet 2   loratadine (CLARITIN) 10 MG tablet TAKE ONE TABLET BY MOUTH DAILY AS NEEDED FOR ALLERGIES 30 tablet 2   metFORMIN (GLUCOPHAGE) 1000 MG tablet TAKE ONE TABLET BY MOUTH TWICE A DAY WITH A MEAL. 180 tablet 1   No  current facility-administered medications on file prior to visit.    No Known Allergies  Social History   Socioeconomic History   Marital status: Divorced    Spouse name: Not on file   Number of children: 0   Years of education: 10   Highest education level: Not on file  Occupational History   Occupation: Unemployed  Tobacco Use   Smoking status: Former Smoker    Years: 45.00    Types: Cigarettes    Quit date: 03/03/2018    Years since quitting: 1.9   Smokeless tobacco: Never Used   Tobacco comment: 11-15-2017  per pt 1pp3days  Vaping Use   Vaping Use: Never assessed  Substance and Sexual Activity   Alcohol use: Not Currently    Alcohol/week: 0.0 standard drinks    Comment: 11-15-2017  per pt last alcohol 12/ 2018  alcohol abuse   Drug use: Not Currently    Types: "Crack" cocaine, Cocaine    Comment: 11-15-2017  per pt last used March 2019   Sexual activity: Not Currently  Other Topics Concern   Not on file  Social History Narrative   Lives at home alone.   Right-handed.   Occasional use.   Social Determinants of Health   Financial Resource Strain: Medium Risk   Difficulty of Paying Living Expenses: Somewhat hard  Food Insecurity: No Food Insecurity   Worried About Charity fundraiser in the Last Year: Never true   Ran Out of Food in the Last  Year: Never true  Transportation Needs: No Transportation Needs   Lack of Transportation (Medical): No   Lack of Transportation (Non-Medical): No  Physical Activity: Inactive   Days of Exercise per Week: 0 days   Minutes of Exercise per Session: 0 min  Stress: No Stress Concern Present   Feeling of Stress : Only a little  Social Connections: Socially Isolated   Frequency of Communication with Friends and Family: Twice a week   Frequency of Social Gatherings with Friends and Family: Once a week   Attends Religious Services: Never   Marine scientist or Organizations: No   Attends Music therapist: Never   Marital Status: Divorced  Human resources officer Violence: Not At Risk   Fear of Current or Ex-Partner: No   Emotionally Abused: No   Physically Abused: No   Sexually Abused: No    Family History  Problem Relation Age of Onset   Hypertension Mother    Diabetes Mother    Cancer Mother        breast cancer    Hypertension Father    Heart disease Father    Stroke Father    Hypertension Sister     Past Surgical History:  Procedure Laterality Date   CARDIAC CATHETERIZATION  12/01/2008   dr hochrein   abnormal stress myoview:  mild coronary plaque, normal LVF   CARDIAC CATHETERIZATION  04/29/2011   dr [place]   in setting ECG with new ST elevation & severe hypertensive:  nonobstructive atherosclerotic CAD, normal LVF (30% mRCA)    CARDIOVASCULAR STRESS TEST  08/15/2010   Low nuclear study w/ no evidence ishemia/  ef 42% with lateral and apical hypokinesis   CYSTOSCOPY WITH BIOPSY N/A 11/20/2017   Procedure: CYSTOSCOPY WITH BIOPSY AND FULGURATION;  Surgeon: [name], MD;  Location: [place];  Service: Urology;  Laterality: N/A;   INCISION AND DRAINAGE RIGHT DISTAL MEDIAL THIGH HEMATOMA  04-14-2005   dr Ninfa Linden   left  arm skin graft  1976   injury   LEFT HEART CATHETERIZATION WITH CORONARY ANGIOGRAM Bilateral  12/26/2013   Procedure: LEFT HEART CATHETERIZATION WITH CORONARY ANGIOGRAM;  Surgeon: Burnell Blanks, MD;  Location: [facility] CATH LAB;  Service: Cardiovascular;  Laterality: Bilateral;  STEMI, in setting cocaine/ alcohol :  mid disease in the RCA (20%), moderate disease in the small caliber intermediate branch (distal 50-60%), normal LVSF (ef 55-60%)   TOOTH EXTRACTION N/A 05/16/2018   Procedure: DENTAL RESTORATION/EXTRACTIONS WITH ALVEO;  Surgeon: Diona Browner, DDS;  Location: [place];  Service: Oral Surgery;  Laterality: N/A;   TRANSTHORACIC ECHOCARDIOGRAM  08/05/2017   moderate LVH,  ef 55%,  grade 1 diastolic dysfunction/  mild LAE/  trivial Tr    ROS: Review of Systems Negative except as stated above  PHYSICAL EXAM: BP 123/69    Pulse 76    Resp 16    Wt 245 lb 3.2 oz (111.2 kg)    SpO2 96%    BMI 30.65 kg/m   Wt Readings from Last 3 Encounters:  02/12/20 245 lb 3.2 oz (111.2 kg)  02/01/20 247 lb (112 kg)  12/09/19 248 lb 6.4 oz (112.7 kg)   Physical Exam  General appearance - alert, well appearing, obese African-American male and in no distress Mental status - normal mood, behavior, speech, dress, motor activity, and thought processes Mouth - mucous membranes moist, pharynx normal without lesions Neck - supple, no significant adenopathy Chest - clear to auscultation, no wheezes, rales or rhonchi, symmetric air entry Heart - normal rate, regular rhythm, normal S1, S2, no murmurs, rubs, clicks or gallops Neurological -grip 4+/5 right, 4/5 left.  Power upper extremities 5/5 proximally and distally on the right, 4/5 with some spasticity proximally and distally on the left.  He ambulates with a Rollator walker.  He has poor foot to floor clearance.  He has to hold onto objects with transfers from chair to exam table.  Gait is slow and wide-based.  Power LE 4/5 with some spasticity on LT Extremities -no lower extremity edema  CMP Latest Ref Rng & Units 02/01/2020  12/25/2019 12/25/2019  Glucose 70 - 99 mg/dL 165(H) 118(H) 122(H)  BUN 8 - 23 mg/dL _0 Creatinine 0.61 - 1.24 mg/dL 1.21 1.30(H) 1.29(H)  Sodium 135 - 145 mmol/L 143 142 140  Potassium 3.5 - 5.1 mmol/L 4.0 4.3 4.4  Chloride 98 - 111 mmol/L 109 108 108  CO2 22 - 32 mmol/L 25 - 23  Calcium 8.9 - 10.3 mg/dL 9.4 - 9.2  Total Protein 6.5 - 8.1 g/dL - - 7.3  Total Bilirubin 0.3 - 1.2 mg/dL - - 0.9  Alkaline Phos 38 - 126 U/L - - 55  AST 15 - 41 U/L - - 13(L)  ALT 0 - 44 U/L - - 10   Lipid Panel     Component Value Date/Time   CHOL 176 04/17/2019 1034   TRIG 174 (H) 04/17/2019 1034   HDL 52 04/17/2019 1034   CHOLHDL 3.4 04/17/2019 1034   CHOLHDL 3.4 03/05/2018 0731   VLDL 18 03/05/2018 0731   LDLCALC 94 04/17/2019 1034    CBC    Component Value Date/Time   WBC 10.7 (H) 02/01/2020 1006   RBC 4.84 02/01/2020 1006   HGB 13.4 02/01/2020 1006   HGB 13.3 04/17/2019 1034   HCT 42.7 02/01/2020 1006   HCT 39.3 04/17/2019 1034   PLT 239 02/01/2020 1006   PLT 233 04/17/2019 1034  MCV 88.2 02/01/2020 1006   MCV 84 04/17/2019 1034   MCH 27.7 02/01/2020 1006   MCHC 31.4 02/01/2020 1006   RDW 14.4 02/01/2020 1006   RDW 12.7 04/17/2019 1034   LYMPHSABS 1.9 12/25/2019 0409   MONOABS 0.7 12/25/2019 0409   EOSABS 0.2 12/25/2019 0409   BASOSABS 0.1 12/25/2019 0409    ASSESSMENT AND PLAN: 1. Type 2 diabetes mellitus with diabetic polyneuropathy, without long-term current use of insulin ([place]) Patient does not have blood sugar log with him today but reports that they have been good.  He will continue current oral medications. -Advised to eliminate sugary drinks.  Try using Splenda in his Kool-Aid rather than regular sugar. - POCT glucose (manual entry) - gabapentin (NEURONTIN) 300 MG capsule; Take 1 capsule (300 mg total) by mouth 2 (two) times daily.  Dispense: 60 capsule; Refill: 6 - glipiZIDE (GLUCOTROL XL) 10 MG 24 hr tablet; Take 1 tablet (10 mg total) by mouth daily with  breakfast.  Dispense: 30 tablet; Refill: 6 - dapagliflozin propanediol (FARXIGA) 10 MG TABS tablet; Take 1 tablet (10 mg total) by mouth daily before breakfast.  Dispense: 30 tablet; Refill: 6  2. Essential hypertension At goal.  Continue current medications and low-salt diet - lisinopril (ZESTRIL) 40 MG tablet; Take 1 tablet (40 mg total) by mouth daily.  Dispense: 30 tablet; Refill: 6  3. Diastolic congestive heart failure, unspecified HF chronicity ([place]) Compensated.  Furosemide, lisinopril and other blood pressure medications.  4. Class 1 obesity due to excess calories with serious comorbidity and body mass index (BMI) of 30.0 to 30.9 in adult Dietary counseling given.  See #1 above.  5. Other polyneuropathy Numbness in his fingers most likely related to diabetic neuropathy but we will check a B12 level since he is on Metformin. - Vitamin B12  6. Allergic rhinitis, unspecified seasonality, unspecified trigger Patient requested refill on Claritin - loratadine (CLARITIN) 10 MG tablet; TAKE ONE TABLET BY MOUTH DAILY AS NEEDED FOR ALLERGIES  Dispense: 30 tablet; Refill: 5  7. History of CVA with residual deficit 8. Left-sided weakness 9. Gait disturbance -We will resubmit referral for home physical therapy and on some occupational therapy.  He would benefit especially from safety training and strengthening  10. Tobacco dependence Advised patient to stay away from cigarettes altogether.  Smoking occasionally will only lead back to him smoking full-time.  He expressed understanding and is agreeable.  Less than 5 minutes spent on counseling.     Patient was given the opportunity to ask questions.  Patient verbalized understanding of the plan and was able to repeat key elements of the plan.   Orders Placed This Encounter  Procedures   POCT glucose (manual entry)     Requested Prescriptions    No prescriptions requested or ordered in this encounter    No follow-ups on  file.  Karle Plumber, MD, FACP

## 2020-02-13 ENCOUNTER — Telehealth: Payer: Self-pay

## 2020-02-13 ENCOUNTER — Ambulatory Visit (INDEPENDENT_AMBULATORY_CARE_PROVIDER_SITE_OTHER): Payer: Medicare Other | Admitting: Podiatry

## 2020-02-13 ENCOUNTER — Encounter: Payer: Self-pay | Admitting: Podiatry

## 2020-02-13 DIAGNOSIS — M79675 Pain in left toe(s): Secondary | ICD-10-CM | POA: Diagnosis not present

## 2020-02-13 DIAGNOSIS — B351 Tinea unguium: Secondary | ICD-10-CM | POA: Diagnosis not present

## 2020-02-13 DIAGNOSIS — L84 Corns and callosities: Secondary | ICD-10-CM

## 2020-02-13 DIAGNOSIS — E1142 Type 2 diabetes mellitus with diabetic polyneuropathy: Secondary | ICD-10-CM

## 2020-02-13 DIAGNOSIS — M79674 Pain in right toe(s): Secondary | ICD-10-CM | POA: Diagnosis not present

## 2020-02-13 LAB — VITAMIN B12: Vitamin B-12: 403 pg/mL (ref 232–1245)

## 2020-02-13 MED ORDER — VITAMIN B-12 500 MCG PO TABS: 500.0000 ug | ORAL_TABLET | Freq: Every day | ORAL | 1 refills | Status: DC

## 2020-02-13 NOTE — Telephone Encounter (Signed)
Contacted pt to go over lab results pt is aware and doesn't have any questions or concerns   Dr. Wynetta Emery pt is wanting to know if you would be able to send a rx to his pharmacy for Vit B12

## 2020-02-13 NOTE — Progress Notes (Signed)
Let patient know that his vitamin B12 level is in a low normal range.  I would recommend purchasing vitamin B12 500 mcg and taking 1 daily.

## 2020-02-13 NOTE — Telephone Encounter (Signed)
Rxn for Vit B12 sent to [facility].

## 2020-02-15 NOTE — Progress Notes (Signed)
Subjective: [name] presents today at risk foot care with history of diabetic neuropathy and painful callus(es) which are preulcerative on the plantar aspect of both feet and painful mycotic toenails b/l that are difficult to trim. Pain interferes with ambulation. Aggravating factors include wearing enclosed shoe gear. Pain is relieved with periodic professional debridement.  Patient  Relates no new pedal concerns on today's visit.  Ladell Pier, MD is patient's PCP. Last visit was: 02/12/2020.  Past Medical History:  Diagnosis Date   Alcohol abuse    11-15-2017  per pt last alcohol Dec 2018   BPH with obstruction/lower urinary tract symptoms    Chronic arm pain    Chronic pain    arms, leg, back   CKD (chronic kidney disease), stage II    Cocaine abuse ([place])    11-15-2017  per pt last used March 2019   Congenital hydrocephalus, unspecified ([place])    slow progression   Diabetic retinopathy of both eyes ([facility])    Diastolic CHF, chronic ([place]) 07/2017   Gait instability    multifactorial -- slow worsening congenital hydrocephalus, peripheral neuropathy, left C5-6 cord lesion   Hematuria    History of acute pulmonary edema 07/2017   History of spinal cord injury 01/02/2014   pt fell, caused spinal cord contusion at C5-6--  residual left side weakness     History of ST elevation myocardial infarction (STEMI) 12/26/2013   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   History of TIA (transient ischemic attack) and stroke    hx cva 12-20-2010 and  TIA 02-22-2011--- no residual's from cva or tia   Hyperlipidemia    Hypertension    followed by pcp   Left-sided weakness 12/2013   chronic due to spinal cord contusion   Lesion of bladder    Peripheral edema    chronic LUE   Peripheral neuropathy    Type 2 diabetes mellitus ([place])    followed by dr Karle Plumber--  last A1c 6.8 on 11-06-2017   Weak urinary stream      Current Outpatient Medications on File  Prior to Visit  Medication Sig Dispense Refill   acetaminophen (TYLENOL) 500 MG tablet TAKE ONE TABLET BY MOUTH TWICE A DAY AS NEEDED (Patient taking differently: Take 500 mg by mouth 2 (two) times daily as needed. ) 60 tablet 0   ASPIR-LOW 81 MG EC tablet TAKE 1 TABLET (81 MG TOTAL) BY MOUTH DAILY. 30 tablet 3   atorvastatin (LIPITOR) 40 MG tablet TAKE 1.5 TABLETS BY MOUTH DAILY 45 tablet 2   blood glucose meter kit and supplies KIT Dispense based on patient and insurance preference. Use once daily as directed. (E11.9) (Patient taking differently: Inject 1 each into the skin See admin instructions. Dispense based on patient and insurance preference. Use once daily as directed. (E11.9)) 1 each 0   Blood Glucose Monitoring Suppl (ACCU-CHEK GUIDE ME) w/Device KIT 1 kit by Does not apply route 3 (three) times daily. Use as instructed to check blood sugar 3 times daily. E11.14 1 kit 0   dapagliflozin propanediol (FARXIGA) 10 MG TABS tablet Take 1 tablet (10 mg total) by mouth daily before breakfast. 30 tablet 6   DULoxetine (CYMBALTA) 60 MG capsule TAKE ONE CAPSULE BY MOUTH DAILY FOR LEG AND BACK PAINS 30 capsule 1   furosemide (LASIX) 40 MG tablet TAKE ONE TABLET BY MOUTH DAILY . TAKE EXTRA TABLET FOR TWO DAYS AS NEEDED FOR INCREASE LOWER EXTREMITY SWELLING 40 tablet 2   gabapentin (  NEURONTIN) 300 MG capsule Take 1 capsule (300 mg total) by mouth 2 (two) times daily. 60 capsule 6   glipiZIDE (GLUCOTROL XL) 10 MG 24 hr tablet Take 1 tablet (10 mg total) by mouth daily with breakfast. 30 tablet 6   glucose blood (ACCU-CHEK GUIDE) test strip Use as instructed to check blood sugar 3 times daily. E11.14 (Patient taking differently: 1 each by Other route See admin instructions. Use as instructed to check blood sugar 3 times daily. E11.14) 100 each 11   hydrALAZINE (APRESOLINE) 10 MG tablet TAKE ONE TABLET BY MOUTH TWICE A DAY 60 tablet 2   labetalol (NORMODYNE) 100 MG tablet TAKE ONE TABLET BY  MOUTH TWICE A DAY 60 tablet 2   Lancets Misc. (ACCU-CHEK SOFTCLIX LANCET DEV) KIT Use as directed (Patient taking differently: 1 each by Other route as directed. ) 1 kit 0   Lancets MISC Use as instructed to check blood sugar 3 times daily. E11.14 (Patient taking differently: 1 each by Other route See admin instructions. Use as instructed to check blood sugar 3 times daily. E11.14) 100 each 11   lisinopril (ZESTRIL) 40 MG tablet Take 1 tablet (40 mg total) by mouth daily. 30 tablet 6   loratadine (CLARITIN) 10 MG tablet TAKE ONE TABLET BY MOUTH DAILY AS NEEDED FOR ALLERGIES 30 tablet 5   metFORMIN (GLUCOPHAGE) 1000 MG tablet TAKE ONE TABLET BY MOUTH TWICE A DAY WITH A MEAL. 180 tablet 1   No current facility-administered medications on file prior to visit.     No Known Allergies  Objective: [name] is a pleasant 59 y.o. y.o. Patient Race: Black or African American [2]  male in NAD. AAO x 3.  There were no vitals filed for this visit.  Vascular Examination: Neurovascular status unchanged b/l. Capillary fill time to digits <3 seconds b/l. Faintly palpable pedal pulses b/l. Pedal hair absent b/l Skin temperature gradient within normal limits b/l. No pain with calf compression b/l.  Dermatological Examination: Pedal skin with normal turgor, texture and tone bilaterally. No open wounds bilaterally. No interdigital macerations bilaterally. Toenails 1-5 b/l elongated, dystrophic, thickened, crumbly with subungual debris and tenderness to dorsal palpation. Hyperkeratotic lesion(s) L hallux and submet head 4 left foot, both with subdermal hemorrhage. +Tenderness to palpation.  No erythema, no edema, no drainage, no flocculence, no abnormal warmth/coolness.  Musculoskeletal: Normal muscle strength 5/5 to all lower extremity muscle groups bilaterally. No pain crepitus or joint limitation noted with ROM b/l. Pes planus deformity noted b/l.  Utilizes rollator for ambulation  assistance.  Neurological Examination: Protective sensation diminished with 10g monofilament b/l. Vibratory sensation diminished b/l.  Assessment: 1. Pain due to onychomycosis of toenails of both feet   2. Callus   3. Diabetic peripheral neuropathy associated with type 2 diabetes mellitus ([place])    Plan: -Examined patient. -Continue diabetic foot care principles. Literature dispensed on today.  -Toenails 1-5 b/l were debrided in length and girth with sterile nail nippers and dremel without iatrogenic bleeding.  -Preulcerative callus(es) L hallux and submet head 4 left foot pared utilizing sterile scalpel blade without complication or incident. Total number debrided =2. -Advised patient to schedule appointment with [facility] or [facility] for modification of insole of left foot to further offload lesions. -Patient to continue soft, supportive shoe gear daily. -Patient to report any pedal injuries to medical professional immediately. -Patient/POA to call should there be question/concern in the interim.  Return in about 9 weeks (around 04/16/2020) for diabetic nail and callus  trim, at risk foot care.  Marzetta Board, DPM

## 2020-02-16 NOTE — Telephone Encounter (Signed)
Pt is aware.  

## 2020-02-18 ENCOUNTER — Telehealth: Payer: Self-pay | Admitting: Internal Medicine

## 2020-02-18 ENCOUNTER — Telehealth: Payer: Self-pay

## 2020-02-18 NOTE — Telephone Encounter (Signed)
Please advise.   Copied from [place] [id number]. Topic: General - Call Back - No Documentation >> Feb 18, 2020  2:17 PM Erick Blinks wrote: Reason for CRM:  [facility] [phone] administrator [place] called requesting a call back from PCP nurse, please advise. She only disclosed that she has personal matters that she needs to discuss with a nurse

## 2020-02-18 NOTE — Telephone Encounter (Signed)
Referral received for home health PT/OT. Call placed to patient and he was in agreement to having the referral sent to [facility].  This CM explained that if [facility] is not able to accept the referral, this CM will contact other agencies.  Call placed to [facility] who requested that the referral be faxed to their office and she said that they should probably be able to see him next week.   Referral faxed as requested to [facility]

## 2020-02-18 NOTE — Telephone Encounter (Signed)
Concern about pt having a drug habit. Is wanting to know resources to provided him and pt is agreeable.

## 2020-02-19 ENCOUNTER — Telehealth: Payer: Self-pay

## 2020-02-19 NOTE — Telephone Encounter (Signed)
Call received from [facility].  She request a written note stating  that it is okay for start of care 7/31/202.   Note faxed as requested to attention of Ms Marene Lenz - fax # [phone]

## 2020-02-21 DIAGNOSIS — I69398 Other sequelae of cerebral infarction: Secondary | ICD-10-CM | POA: Diagnosis not present

## 2020-02-23 ENCOUNTER — Telehealth: Payer: Self-pay | Admitting: Internal Medicine

## 2020-02-23 NOTE — Telephone Encounter (Signed)
[name] needs PT order 1x2 and then twice a wk for 2 wk and finished with 1x6. Ok to leave message on her vm

## 2020-02-23 NOTE — Telephone Encounter (Signed)
Returned [place] call from [place] and gave verbal orders. Also she will giver verbal orders to [place] [facility] as well

## 2020-02-23 NOTE — Telephone Encounter (Signed)
Returned [place] call from [place] and gave verbal orders. Also she will giver verbal orders to [place] as well

## 2020-02-23 NOTE — Telephone Encounter (Signed)
I am unable to authorize these orders.

## 2020-02-23 NOTE — Telephone Encounter (Signed)
Brandon with [facility] calling for verbal OT orders 2 wk 6  cb  [phone]

## 2020-02-24 ENCOUNTER — Other Ambulatory Visit: Payer: Self-pay | Admitting: *Deleted

## 2020-02-24 NOTE — Patient Outreach (Signed)
[facility]) Care Management  02/24/2020  Alesandro ELEK HOLDERNESS 01-28-1961 [id number]   Call placed to member to follow upon management of medical conditions as well as noted ED visit on 7/11.  State he remains weak in the lower extremities, hoping PT and OT will be able to help increase his strength.  He was seen by PCP on 7/22, home health orders were renewed.  He is still active with [facility] for in home aide services.  Report blood sugars and blood pressure remain "alright," state blood sugars consistently lower than 200.  He will have appointment for behavioral health on 8/11 and with neurology on 9/7.  Denies any urgent concerns at this time, encouraged to contact this care manager with questions.  Goals Addressed              This Visit's Progress   .  I want to get off some of these medications (pt-stated)   On track     [place] (see longtitudinal plan of care for additional care plan information)  Objective:  Lab Results  Component Value Date   HGBA1C 7.3 (H) 12/09/2019 .   Lab Results  Component Value Date   CREATININE 1.30 (H) 12/25/2019   CREATININE 1.29 (H) 12/25/2019   CREATININE 1.20 11/08/2019 .   Marland Kitchen No results found for: EGFR  Current Barriers:  Marland Kitchen Knowledge Deficits related to basic Diabetes pathophysiology and self care/management  Case Manager Clinical Goal(s):  Over the next 31 days, patient will demonstrate improved adherence to prescribed treatment plan for diabetes self care/management as evidenced by:  Marland Kitchen Verbalize daily monitoring and recording of CBG within 28 days . Verbalize adherence to ADA/ carb modified diet within the next 28 days . Verbalize exercise 3 days/week  Interventions:  . Provided education to patient about basic DM disease process . Reviewed medications with patient and discussed importance of medication adherence . Discussed plans with patient for ongoing care management follow up and provided patient with direct  contact information for care management team . Advised patient, providing education and rationale, to check cbg daily and record, calling MD for findings outside established parameters.    Patient Self Care Activities:  . Attends all scheduled provider appointments         [name], RN, MSN [place] Manager [phone]

## 2020-02-25 ENCOUNTER — Inpatient Hospital Stay (HOSPITAL_COMMUNITY)
Admission: EM | Admit: 2020-02-25 | Discharge: 2020-03-11 | DRG: 472 | Disposition: A | Discharge status: Rehabilitation Facility | Payer: Medicare Other | Attending: Internal Medicine | Admitting: Internal Medicine

## 2020-02-25 ENCOUNTER — Telehealth: Payer: Self-pay | Admitting: Internal Medicine

## 2020-02-25 ENCOUNTER — Other Ambulatory Visit: Payer: Self-pay

## 2020-02-25 DIAGNOSIS — R531 Weakness: Secondary | ICD-10-CM

## 2020-02-25 DIAGNOSIS — K592 Neurogenic bowel, not elsewhere classified: Secondary | ICD-10-CM | POA: Diagnosis present

## 2020-02-25 DIAGNOSIS — R5381 Other malaise: Secondary | ICD-10-CM | POA: Diagnosis not present

## 2020-02-25 DIAGNOSIS — Z794 Long term (current) use of insulin: Secondary | ICD-10-CM

## 2020-02-25 DIAGNOSIS — N319 Neuromuscular dysfunction of bladder, unspecified: Secondary | ICD-10-CM | POA: Diagnosis present

## 2020-02-25 DIAGNOSIS — M4712 Other spondylosis with myelopathy, cervical region: Secondary | ICD-10-CM | POA: Diagnosis present

## 2020-02-25 DIAGNOSIS — Q039 Congenital hydrocephalus, unspecified: Secondary | ICD-10-CM

## 2020-02-25 DIAGNOSIS — R258 Other abnormal involuntary movements: Secondary | ICD-10-CM | POA: Diagnosis present

## 2020-02-25 DIAGNOSIS — Z8249 Family history of ischemic heart disease and other diseases of the circulatory system: Secondary | ICD-10-CM

## 2020-02-25 DIAGNOSIS — F1410 Cocaine abuse, uncomplicated: Secondary | ICD-10-CM | POA: Diagnosis present

## 2020-02-25 DIAGNOSIS — G629 Polyneuropathy, unspecified: Secondary | ICD-10-CM

## 2020-02-25 DIAGNOSIS — G9519 Other vascular myelopathies: Secondary | ICD-10-CM | POA: Diagnosis not present

## 2020-02-25 DIAGNOSIS — G9520 Unspecified cord compression: Secondary | ICD-10-CM | POA: Diagnosis not present

## 2020-02-25 DIAGNOSIS — F17210 Nicotine dependence, cigarettes, uncomplicated: Secondary | ICD-10-CM | POA: Diagnosis present

## 2020-02-25 DIAGNOSIS — R262 Difficulty in walking, not elsewhere classified: Secondary | ICD-10-CM | POA: Diagnosis present

## 2020-02-25 DIAGNOSIS — F1010 Alcohol abuse, uncomplicated: Secondary | ICD-10-CM | POA: Diagnosis present

## 2020-02-25 DIAGNOSIS — G8929 Other chronic pain: Secondary | ICD-10-CM | POA: Diagnosis present

## 2020-02-25 DIAGNOSIS — I130 Hypertensive heart and chronic kidney disease with heart failure and stage 1 through stage 4 chronic kidney disease, or unspecified chronic kidney disease: Secondary | ICD-10-CM | POA: Diagnosis present

## 2020-02-25 DIAGNOSIS — G9589 Other specified diseases of spinal cord: Secondary | ICD-10-CM | POA: Diagnosis not present

## 2020-02-25 DIAGNOSIS — E119 Type 2 diabetes mellitus without complications: Secondary | ICD-10-CM

## 2020-02-25 DIAGNOSIS — N401 Enlarged prostate with lower urinary tract symptoms: Secondary | ICD-10-CM | POA: Diagnosis present

## 2020-02-25 DIAGNOSIS — E785 Hyperlipidemia, unspecified: Secondary | ICD-10-CM | POA: Diagnosis present

## 2020-02-25 DIAGNOSIS — I5030 Unspecified diastolic (congestive) heart failure: Secondary | ICD-10-CM | POA: Diagnosis present

## 2020-02-25 DIAGNOSIS — Z803 Family history of malignant neoplasm of breast: Secondary | ICD-10-CM

## 2020-02-25 DIAGNOSIS — Z79899 Other long term (current) drug therapy: Secondary | ICD-10-CM

## 2020-02-25 DIAGNOSIS — I252 Old myocardial infarction: Secondary | ICD-10-CM

## 2020-02-25 DIAGNOSIS — N182 Chronic kidney disease, stage 2 (mild): Secondary | ICD-10-CM | POA: Diagnosis present

## 2020-02-25 DIAGNOSIS — E1122 Type 2 diabetes mellitus with diabetic chronic kidney disease: Secondary | ICD-10-CM | POA: Diagnosis present

## 2020-02-25 DIAGNOSIS — H538 Other visual disturbances: Secondary | ICD-10-CM | POA: Diagnosis present

## 2020-02-25 DIAGNOSIS — M47814 Spondylosis without myelopathy or radiculopathy, thoracic region: Secondary | ICD-10-CM | POA: Diagnosis present

## 2020-02-25 DIAGNOSIS — E1142 Type 2 diabetes mellitus with diabetic polyneuropathy: Secondary | ICD-10-CM | POA: Diagnosis present

## 2020-02-25 DIAGNOSIS — I10 Essential (primary) hypertension: Secondary | ICD-10-CM | POA: Diagnosis not present

## 2020-02-25 DIAGNOSIS — E1169 Type 2 diabetes mellitus with other specified complication: Secondary | ICD-10-CM | POA: Diagnosis present

## 2020-02-25 DIAGNOSIS — Z8673 Personal history of transient ischemic attack (TIA), and cerebral infarction without residual deficits: Secondary | ICD-10-CM

## 2020-02-25 DIAGNOSIS — Z419 Encounter for procedure for purposes other than remedying health state, unspecified: Secondary | ICD-10-CM

## 2020-02-25 DIAGNOSIS — Z823 Family history of stroke: Secondary | ICD-10-CM

## 2020-02-25 DIAGNOSIS — I5032 Chronic diastolic (congestive) heart failure: Secondary | ICD-10-CM | POA: Diagnosis present

## 2020-02-25 DIAGNOSIS — M4802 Spinal stenosis, cervical region: Secondary | ICD-10-CM | POA: Diagnosis not present

## 2020-02-25 DIAGNOSIS — Z833 Family history of diabetes mellitus: Secondary | ICD-10-CM

## 2020-02-25 DIAGNOSIS — E11319 Type 2 diabetes mellitus with unspecified diabetic retinopathy without macular edema: Secondary | ICD-10-CM | POA: Diagnosis present

## 2020-02-25 DIAGNOSIS — I2510 Atherosclerotic heart disease of native coronary artery without angina pectoris: Secondary | ICD-10-CM | POA: Diagnosis present

## 2020-02-25 DIAGNOSIS — Z20822 Contact with and (suspected) exposure to covid-19: Secondary | ICD-10-CM | POA: Diagnosis present

## 2020-02-25 DIAGNOSIS — Z6830 Body mass index (BMI) 30.0-30.9, adult: Secondary | ICD-10-CM

## 2020-02-25 LAB — CBC
HCT: 46.4 % (ref 39.0–52.0)
Hemoglobin: 14.7 g/dL (ref 13.0–17.0)
MCH: 27.2 pg (ref 26.0–34.0)
MCHC: 31.7 g/dL (ref 30.0–36.0)
MCV: 85.9 fL (ref 80.0–100.0)
Platelets: 290 10*3/uL (ref 150–400)
RBC: 5.4 MIL/uL (ref 4.22–5.81)
RDW: 14.3 % (ref 11.5–15.5)
WBC: 10.2 10*3/uL (ref 4.0–10.5)
nRBC: 0 % (ref 0.0–0.2)

## 2020-02-25 LAB — BASIC METABOLIC PANEL
Anion gap: 8 (ref 5–15)
BUN: 13 mg/dL (ref 8–23)
CO2: 25 mmol/L (ref 22–32)
Calcium: 9.9 mg/dL (ref 8.9–10.3)
Chloride: 105 mmol/L (ref 98–111)
Creatinine, Ser: 1.2 mg/dL (ref 0.61–1.24)
GFR calc Af Amer: >60 mL/min (ref >60)
GFR calc non Af Amer: >60 mL/min (ref >60)
Glucose, Bld: 168 mg/dL — ABNORMAL HIGH (ref 70–99)
Potassium: 4.5 mmol/L (ref 3.5–5.1)
Sodium: 138 mmol/L (ref 135–145)

## 2020-02-25 LAB — CBG MONITORING, ED: Glucose-Capillary: 174 mg/dL — ABNORMAL HIGH (ref 70–99)

## 2020-02-25 NOTE — ED Triage Notes (Signed)
Pt reports increase in generalized weakness and numbness in bilateral legs (hx of neuropathy) since 0500 this morning. Evaluated for same before with negative workups. Pt hypertensive to 198 at home today, compliant with all medications. Also endorses headache.

## 2020-02-25 NOTE — Telephone Encounter (Signed)
Please advise.  Copied from [place] [phone]. Topic: General - Inquiry >> Feb 25, 2020  3:25 PM Alease Frame wrote: Reason for CRM: pt is needing a call back from [facility] nurse . Pt didn't stat reason . Please advise

## 2020-02-26 ENCOUNTER — Emergency Department (HOSPITAL_COMMUNITY): Payer: Medicare Other

## 2020-02-26 DIAGNOSIS — F17210 Nicotine dependence, cigarettes, uncomplicated: Secondary | ICD-10-CM | POA: Diagnosis present

## 2020-02-26 DIAGNOSIS — M4802 Spinal stenosis, cervical region: Secondary | ICD-10-CM | POA: Diagnosis not present

## 2020-02-26 DIAGNOSIS — I2510 Atherosclerotic heart disease of native coronary artery without angina pectoris: Secondary | ICD-10-CM | POA: Diagnosis present

## 2020-02-26 DIAGNOSIS — I252 Old myocardial infarction: Secondary | ICD-10-CM | POA: Diagnosis not present

## 2020-02-26 DIAGNOSIS — Z20822 Contact with and (suspected) exposure to covid-19: Secondary | ICD-10-CM | POA: Diagnosis not present

## 2020-02-26 DIAGNOSIS — Z833 Family history of diabetes mellitus: Secondary | ICD-10-CM | POA: Diagnosis not present

## 2020-02-26 DIAGNOSIS — E1142 Type 2 diabetes mellitus with diabetic polyneuropathy: Secondary | ICD-10-CM | POA: Diagnosis not present

## 2020-02-26 DIAGNOSIS — M4322 Fusion of spine, cervical region: Secondary | ICD-10-CM | POA: Diagnosis not present

## 2020-02-26 DIAGNOSIS — N182 Chronic kidney disease, stage 2 (mild): Secondary | ICD-10-CM | POA: Diagnosis not present

## 2020-02-26 DIAGNOSIS — I5032 Chronic diastolic (congestive) heart failure: Secondary | ICD-10-CM | POA: Diagnosis not present

## 2020-02-26 DIAGNOSIS — E11319 Type 2 diabetes mellitus with unspecified diabetic retinopathy without macular edema: Secondary | ICD-10-CM | POA: Diagnosis not present

## 2020-02-26 DIAGNOSIS — M4712 Other spondylosis with myelopathy, cervical region: Secondary | ICD-10-CM | POA: Diagnosis not present

## 2020-02-26 DIAGNOSIS — G9520 Unspecified cord compression: Secondary | ICD-10-CM | POA: Diagnosis present

## 2020-02-26 DIAGNOSIS — R7309 Other abnormal glucose: Secondary | ICD-10-CM | POA: Diagnosis not present

## 2020-02-26 DIAGNOSIS — G8929 Other chronic pain: Secondary | ICD-10-CM | POA: Diagnosis present

## 2020-02-26 DIAGNOSIS — G9519 Other vascular myelopathies: Secondary | ICD-10-CM | POA: Diagnosis not present

## 2020-02-26 DIAGNOSIS — E785 Hyperlipidemia, unspecified: Secondary | ICD-10-CM | POA: Diagnosis not present

## 2020-02-26 DIAGNOSIS — Z8673 Personal history of transient ischemic attack (TIA), and cerebral infarction without residual deficits: Secondary | ICD-10-CM | POA: Diagnosis not present

## 2020-02-26 DIAGNOSIS — G992 Myelopathy in diseases classified elsewhere: Secondary | ICD-10-CM | POA: Diagnosis not present

## 2020-02-26 DIAGNOSIS — M47814 Spondylosis without myelopathy or radiculopathy, thoracic region: Secondary | ICD-10-CM | POA: Diagnosis not present

## 2020-02-26 DIAGNOSIS — R531 Weakness: Secondary | ICD-10-CM | POA: Diagnosis not present

## 2020-02-26 DIAGNOSIS — K560 Paralytic ileus: Secondary | ICD-10-CM | POA: Diagnosis not present

## 2020-02-26 DIAGNOSIS — F1410 Cocaine abuse, uncomplicated: Secondary | ICD-10-CM | POA: Diagnosis present

## 2020-02-26 DIAGNOSIS — N189 Chronic kidney disease, unspecified: Secondary | ICD-10-CM | POA: Diagnosis not present

## 2020-02-26 DIAGNOSIS — I130 Hypertensive heart and chronic kidney disease with heart failure and stage 1 through stage 4 chronic kidney disease, or unspecified chronic kidney disease: Secondary | ICD-10-CM | POA: Diagnosis not present

## 2020-02-26 DIAGNOSIS — Z823 Family history of stroke: Secondary | ICD-10-CM | POA: Diagnosis not present

## 2020-02-26 DIAGNOSIS — Z803 Family history of malignant neoplasm of breast: Secondary | ICD-10-CM | POA: Diagnosis not present

## 2020-02-26 DIAGNOSIS — G9589 Other specified diseases of spinal cord: Secondary | ICD-10-CM | POA: Diagnosis not present

## 2020-02-26 DIAGNOSIS — I10 Essential (primary) hypertension: Secondary | ICD-10-CM | POA: Diagnosis not present

## 2020-02-26 DIAGNOSIS — G959 Disease of spinal cord, unspecified: Secondary | ICD-10-CM | POA: Diagnosis not present

## 2020-02-26 DIAGNOSIS — E1169 Type 2 diabetes mellitus with other specified complication: Secondary | ICD-10-CM | POA: Diagnosis not present

## 2020-02-26 DIAGNOSIS — R262 Difficulty in walking, not elsewhere classified: Secondary | ICD-10-CM | POA: Diagnosis present

## 2020-02-26 DIAGNOSIS — E1122 Type 2 diabetes mellitus with diabetic chronic kidney disease: Secondary | ICD-10-CM | POA: Diagnosis not present

## 2020-02-26 DIAGNOSIS — I5030 Unspecified diastolic (congestive) heart failure: Secondary | ICD-10-CM

## 2020-02-26 DIAGNOSIS — Z8249 Family history of ischemic heart disease and other diseases of the circulatory system: Secondary | ICD-10-CM | POA: Diagnosis not present

## 2020-02-26 DIAGNOSIS — K592 Neurogenic bowel, not elsewhere classified: Secondary | ICD-10-CM | POA: Diagnosis not present

## 2020-02-26 DIAGNOSIS — Z981 Arthrodesis status: Secondary | ICD-10-CM | POA: Diagnosis not present

## 2020-02-26 LAB — CBC WITH DIFFERENTIAL/PLATELET
Abs Immature Granulocytes: 0.1 10*3/uL — ABNORMAL HIGH (ref 0.00–0.07)
Basophils Absolute: 0.1 10*3/uL (ref 0.0–0.1)
Basophils Relative: 1 %
Eosinophils Absolute: 0.3 10*3/uL (ref 0.0–0.5)
Eosinophils Relative: 3 %
HCT: 43.8 % (ref 39.0–52.0)
Hemoglobin: 13.7 g/dL (ref 13.0–17.0)
Immature Granulocytes: 1 %
Lymphocytes Relative: 23 %
Lymphs Abs: 2.2 10*3/uL (ref 0.7–4.0)
MCH: 27.2 pg (ref 26.0–34.0)
MCHC: 31.3 g/dL (ref 30.0–36.0)
MCV: 86.9 fL (ref 80.0–100.0)
Monocytes Absolute: 1 10*3/uL (ref 0.1–1.0)
Monocytes Relative: 11 %
Neutro Abs: 6 10*3/uL (ref 1.7–7.7)
Neutrophils Relative %: 61 %
Platelets: 252 10*3/uL (ref 150–400)
RBC: 5.04 MIL/uL (ref 4.22–5.81)
RDW: 14.2 % (ref 11.5–15.5)
WBC: 9.6 10*3/uL (ref 4.0–10.5)
nRBC: 0 % (ref 0.0–0.2)

## 2020-02-26 LAB — COMPREHENSIVE METABOLIC PANEL
ALT: 14 U/L (ref 0–44)
AST: 16 U/L (ref 15–41)
Albumin: 3.7 g/dL (ref 3.5–5.0)
Alkaline Phosphatase: 61 U/L (ref 38–126)
Anion gap: 10 (ref 5–15)
BUN: 26 mg/dL — ABNORMAL HIGH (ref 8–23)
CO2: 25 mmol/L (ref 22–32)
Calcium: 9.4 mg/dL (ref 8.9–10.3)
Chloride: 103 mmol/L (ref 98–111)
Creatinine, Ser: 1.46 mg/dL — ABNORMAL HIGH (ref 0.61–1.24)
GFR calc Af Amer: 59 mL/min — ABNORMAL LOW (ref >60)
GFR calc non Af Amer: 51 mL/min — ABNORMAL LOW (ref >60)
Glucose, Bld: 209 mg/dL — ABNORMAL HIGH (ref 70–99)
Potassium: 4.5 mmol/L (ref 3.5–5.1)
Sodium: 138 mmol/L (ref 135–145)
Total Bilirubin: 1 mg/dL (ref 0.3–1.2)
Total Protein: 6.7 g/dL (ref 6.5–8.1)

## 2020-02-26 LAB — PHOSPHORUS: Phosphorus: 4 mg/dL (ref 2.5–4.6)

## 2020-02-26 LAB — MAGNESIUM: Magnesium: 2.1 mg/dL (ref 1.7–2.4)

## 2020-02-26 LAB — GLUCOSE, CAPILLARY: Glucose-Capillary: 286 mg/dL — ABNORMAL HIGH (ref 70–99)

## 2020-02-26 LAB — SARS CORONAVIRUS 2 BY RT PCR (HOSPITAL ORDER, PERFORMED IN ~~LOC~~ HOSPITAL LAB): SARS Coronavirus 2: NEGATIVE

## 2020-02-26 LAB — HIV ANTIBODY (ROUTINE TESTING W REFLEX): HIV Screen 4th Generation wRfx: NONREACTIVE

## 2020-02-26 MED ORDER — HYDROMORPHONE HCL 1 MG/ML IJ SOLN: 0.5000 mg | INTRAMUSCULAR | Status: DC | PRN

## 2020-02-26 MED ORDER — LISINOPRIL 20 MG PO TABS
40.0000 mg | ORAL_TABLET | Freq: Every day | ORAL | Status: DC
  Administered 2020-02-26 – 2020-03-11 (×15): 40 mg via ORAL
  Filled 2020-02-26 (×5): qty 1
  Filled 2020-02-26: qty 2
  Filled 2020-02-26: qty 1
  Filled 2020-02-26: qty 2
  Filled 2020-02-26: qty 1
  Filled 2020-02-26 (×4): qty 2
  Filled 2020-02-26 (×2): qty 1

## 2020-02-26 MED ORDER — ONDANSETRON HCL 4 MG/2ML IJ SOLN: 4.0000 mg | Freq: Four times a day (QID) | INTRAMUSCULAR | Status: DC | PRN

## 2020-02-26 MED ORDER — GADOBUTROL 1 MMOL/ML IV SOLN
8.0000 mL | Freq: Once | INTRAVENOUS | Status: AC | PRN
  Administered 2020-02-26: 8 mL via INTRAVENOUS

## 2020-02-26 MED ORDER — INSULIN ASPART 100 UNIT/ML ~~LOC~~ SOLN
0.0000 [IU] | Freq: Three times a day (TID) | SUBCUTANEOUS | Status: DC
  Administered 2020-02-27: 8 [IU] via SUBCUTANEOUS
  Administered 2020-02-27 (×2): 5 [IU] via SUBCUTANEOUS
  Administered 2020-02-28: 8 [IU] via SUBCUTANEOUS
  Administered 2020-02-28: 11 [IU] via SUBCUTANEOUS
  Administered 2020-02-28: 5 [IU] via SUBCUTANEOUS
  Administered 2020-02-29: 8 [IU] via SUBCUTANEOUS
  Administered 2020-02-29 (×2): 11 [IU] via SUBCUTANEOUS
  Administered 2020-03-01: 5 [IU] via SUBCUTANEOUS
  Administered 2020-03-01 (×2): 8 [IU] via SUBCUTANEOUS
  Administered 2020-03-02: 3 [IU] via SUBCUTANEOUS
  Administered 2020-03-02: 5 [IU] via SUBCUTANEOUS
  Administered 2020-03-02: 8 [IU] via SUBCUTANEOUS
  Administered 2020-03-03: 5 [IU] via SUBCUTANEOUS
  Administered 2020-03-03: 2 [IU] via SUBCUTANEOUS
  Administered 2020-03-03: 3 [IU] via SUBCUTANEOUS
  Administered 2020-03-04 (×3): 2 [IU] via SUBCUTANEOUS
  Administered 2020-03-05: 3 [IU] via SUBCUTANEOUS
  Administered 2020-03-06: 2 [IU] via SUBCUTANEOUS
  Administered 2020-03-06: 11 [IU] via SUBCUTANEOUS
  Administered 2020-03-06: 3 [IU] via SUBCUTANEOUS
  Administered 2020-03-07: 11 [IU] via SUBCUTANEOUS
  Administered 2020-03-07: 8 [IU] via SUBCUTANEOUS
  Administered 2020-03-07: 2 [IU] via SUBCUTANEOUS
  Administered 2020-03-08: 5 [IU] via SUBCUTANEOUS

## 2020-02-26 MED ORDER — FUROSEMIDE 40 MG PO TABS
40.0000 mg | ORAL_TABLET | Freq: Every day | ORAL | Status: DC
  Administered 2020-02-26 – 2020-03-11 (×15): 40 mg via ORAL
  Filled 2020-02-26 (×15): qty 1

## 2020-02-26 MED ORDER — ACETAMINOPHEN 650 MG RE SUPP: 650.0000 mg | Freq: Four times a day (QID) | RECTAL | Status: DC | PRN

## 2020-02-26 MED ORDER — ATORVASTATIN CALCIUM 40 MG PO TABS
40.0000 mg | ORAL_TABLET | Freq: Every day | ORAL | Status: DC
  Administered 2020-02-26 – 2020-03-11 (×15): 40 mg via ORAL
  Filled 2020-02-26 (×15): qty 1

## 2020-02-26 MED ORDER — LABETALOL HCL 100 MG PO TABS
100.0000 mg | ORAL_TABLET | Freq: Two times a day (BID) | ORAL | Status: DC
  Administered 2020-02-26 – 2020-03-11 (×27): 100 mg via ORAL
  Filled 2020-02-26 (×28): qty 1

## 2020-02-26 MED ORDER — HYDRALAZINE HCL 10 MG PO TABS
10.0000 mg | ORAL_TABLET | Freq: Two times a day (BID) | ORAL | Status: DC
  Administered 2020-02-26 – 2020-03-01 (×9): 10 mg via ORAL
  Filled 2020-02-26 (×9): qty 1

## 2020-02-26 MED ORDER — SODIUM CHLORIDE 0.9 % IV SOLN: INTRAVENOUS | Status: DC

## 2020-02-26 MED ORDER — DULOXETINE HCL 60 MG PO CPEP
60.0000 mg | ORAL_CAPSULE | Freq: Every day | ORAL | Status: DC
  Administered 2020-02-26 – 2020-03-11 (×15): 60 mg via ORAL
  Filled 2020-02-26 (×15): qty 1

## 2020-02-26 MED ORDER — ASPIRIN EC 81 MG PO TBEC
81.0000 mg | DELAYED_RELEASE_TABLET | Freq: Every day | ORAL | Status: DC
  Administered 2020-02-26 – 2020-02-28 (×3): 81 mg via ORAL
  Filled 2020-02-26 (×3): qty 1

## 2020-02-26 MED ORDER — ACETAMINOPHEN 325 MG PO TABS
650.0000 mg | ORAL_TABLET | Freq: Four times a day (QID) | ORAL | Status: DC | PRN
  Administered 2020-02-29: 650 mg via ORAL
  Filled 2020-02-26: qty 2

## 2020-02-26 MED ORDER — ONDANSETRON HCL 4 MG PO TABS: 4.0000 mg | ORAL_TABLET | Freq: Four times a day (QID) | ORAL | Status: DC | PRN

## 2020-02-26 MED ORDER — GABAPENTIN 300 MG PO CAPS
300.0000 mg | ORAL_CAPSULE | Freq: Two times a day (BID) | ORAL | Status: DC
  Administered 2020-02-26 – 2020-03-11 (×28): 300 mg via ORAL
  Filled 2020-02-26 (×28): qty 1

## 2020-02-26 MED ORDER — ENOXAPARIN SODIUM 40 MG/0.4ML ~~LOC~~ SOLN
40.0000 mg | SUBCUTANEOUS | Status: DC
  Administered 2020-02-26 – 2020-02-27 (×2): 40 mg via SUBCUTANEOUS
  Filled 2020-02-26 (×2): qty 0.4

## 2020-02-26 MED ORDER — DEXAMETHASONE SODIUM PHOSPHATE 4 MG/ML IJ SOLN
4.0000 mg | Freq: Four times a day (QID) | INTRAMUSCULAR | Status: DC
  Administered 2020-02-26 – 2020-03-02 (×18): 4 mg via INTRAVENOUS
  Filled 2020-02-26 (×20): qty 1

## 2020-02-26 MED ORDER — INSULIN ASPART 100 UNIT/ML ~~LOC~~ SOLN
0.0000 [IU] | Freq: Every day | SUBCUTANEOUS | Status: DC
  Administered 2020-02-26 – 2020-02-27 (×2): 3 [IU] via SUBCUTANEOUS
  Administered 2020-02-28: 4 [IU] via SUBCUTANEOUS
  Administered 2020-02-29: 2 [IU] via SUBCUTANEOUS
  Administered 2020-03-01: 4 [IU] via SUBCUTANEOUS
  Administered 2020-03-02: 2 [IU] via SUBCUTANEOUS
  Administered 2020-03-06: 3 [IU] via SUBCUTANEOUS
  Administered 2020-03-07: 4 [IU] via SUBCUTANEOUS

## 2020-02-26 MED ORDER — OXYCODONE HCL 5 MG PO TABS
5.0000 mg | ORAL_TABLET | ORAL | Status: DC | PRN
  Administered 2020-03-04: 5 mg via ORAL
  Filled 2020-02-26 (×2): qty 1

## 2020-02-26 NOTE — ED Notes (Signed)
Neuro surgery at bedside.

## 2020-02-26 NOTE — ED Notes (Signed)
Per Gertie Fey PA- ok for pt to eat, pt provided food.

## 2020-02-26 NOTE — ED Provider Notes (Signed)
[facility] EMERGENCY DEPARTMENT Provider Note   CSN: [id number] Arrival date & time: 02/25/20  1649     History Chief Complaint  Patient presents with  . Hypertension  . Weakness    Kavir NAJEEB UPTAIN is a 59 y.o. male.  The history is provided by the patient and medical records. No language interpreter was used.  Hypertension  Weakness    59 year old male significant history of cocaine abuse, chronic pain, CHF, hypertension, diabetes, prior stroke presenting complaining of weakness.  Patient report yesterday morning when he woke up he was experiencing generalized weakness and numbness throughout his body.  States that he was noticing difficulty eating while picking up his utensils.  States he was having difficulty cutting his food.  Symptom has been persistent without any improvement throughout the day.  Per triage note, patient was endorsing headache and on initial set of vital signs, he was hypertensive with a systolic blood pressure of 198.  Patient however denies any significant headache.  He denies any recent cold symptoms, no nausea vomiting or diarrhea no chest pain no abdominal pain or any dysuria.  He denies any recent medication changes.  He admits to cocaine use about a week ago, and alcohol use several days prior.  He is not on any anticoagulants.  He has had TIA in the past.  He has had his Covid vaccination.  History was difficult to obtain as patient appears drowsy but arousable.  He is slow to answer question.  Patient does report he does use a walker to walk and have been having worsening weakness and difficulty ambulating for the past several days.  Past Medical History:  Diagnosis Date  . Alcohol abuse    11-15-2017  per pt last alcohol Dec 2018  . BPH with obstruction/lower urinary tract symptoms   . Chronic arm pain   . Chronic pain    arms, leg, back  . CKD (chronic kidney disease), stage II   . Cocaine abuse ([place])    11-15-2017  per pt last  used March 2019  . Congenital hydrocephalus, unspecified (HCC)    slow progression  . Diabetic retinopathy of both eyes ([place])   . Diastolic CHF, chronic ([place]) 07/2017  . Gait instability    multifactorial -- slow worsening congenital hydrocephalus, peripheral neuropathy, left C5-6 cord lesion  . Hematuria   . History of acute pulmonary edema 07/2017  . History of spinal cord injury 01/02/2014   pt fell, caused spinal cord contusion at C5-6--  residual left side weakness    . 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 History of TIA (transient ischemic attack) and stroke    hx cva 12-20-2010 and  TIA 02-22-2011--- no residual's from cva or tia  . Hyperlipidemia   . Hypertension    followed by pcp  . 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 Lesion of bladder   . Peripheral edema    chronic LUE  . Peripheral neuropathy   . Type 2 diabetes mellitus ([place])    followed by dr Karle Plumber--  last A1c 6.8 on 11-06-2017  . Weak urinary stream     Patient Active Problem List   Diagnosis Date Noted  . Diastolic congestive heart failure ([place]) 02/12/2020  . History of spinal cord injury 04/17/2019  . TIA (transient ischemic attack) 03/05/2018  . Benign essential HTN 03/05/2018  . Sensorineural hearing loss of  both ears 12/04/2017  . Mild nonproliferative diabetic retinopathy ([place]) 01/10/2016  . Hypertensive retinopathy of both eyes 01/10/2016  . Spinal stenosis of lumbar region 07/15/2015  . Abnormality of gait 06/07/2015  . Hydrocephalus ([place]) 06/07/2015  . Peripheral neuropathy 04/26/2015  . Constipation 04/26/2015  . Diabetes mellitus type 2 with complications ([place]) 21/30/8657  . Decreased hearing of both ears 03/15/2015  . Tinea pedis 03/15/2015  . Pre-ulcerative calluses 05/22/2014  . Cocaine abuse ([place]) 12/28/2013  . Left-sided weakness 12/26/2013  . Hyperlipidemia associated with  type 2 diabetes mellitus ([place]) 04/01/2007  . Class 2 severe obesity due to excess calories with serious comorbidity and body mass index (BMI) of 36.0 to 36.9 in adult ([place]) 03/28/2007  . Essential hypertension 03/28/2007    Past Surgical History:  Procedure Laterality Date  . CARDIAC CATHETERIZATION  12/01/2008   dr hochrein   abnormal stress myoview:  mild coronary plaque, normal LVF  . CARDIAC CATHETERIZATION  04/29/2011   dr [place]   in setting ECG with new ST elevation & severe hypertensive:  nonobstructive atherosclerotic CAD, normal LVF (30% mRCA)   . CARDIOVASCULAR STRESS TEST  08/15/2010   Low nuclear study w/ no evidence ishemia/  ef 42% with lateral and apical hypokinesis  . CYSTOSCOPY WITH BIOPSY N/A 11/20/2017   Procedure: CYSTOSCOPY WITH BIOPSY AND FULGURATION;  Surgeon: [name], MD;  Location: [facility];  Service: Urology;  Laterality: N/A;  . INCISION AND DRAINAGE RIGHT DISTAL MEDIAL THIGH HEMATOMA  04-14-2005   dr Ninfa Linden  . left arm skin graft  1976   injury  . LEFT HEART CATHETERIZATION WITH CORONARY ANGIOGRAM Bilateral 12/26/2013   Procedure: LEFT HEART CATHETERIZATION WITH CORONARY ANGIOGRAM;  Surgeon: Burnell Blanks, MD;  Location: [facility] C[facility] LAB;  Service: Cardiovascular;  Laterality: Bilateral;  STEMI, in setting cocaine/ alcohol :  mid disease in the RCA (20%), moderate disease in the small caliber intermediate branch (distal 50-60%), normal LVSF (ef 55-60%)  . TOOTH EXTRACTION N/A 05/16/2018   Procedure: DENTAL RESTORATION/EXTRACTIONS WITH ALVEO;  Surgeon: Diona Browner, DDS;  Location: [facility];  Service: Oral Surgery;  Laterality: N/A;  . TRANSTHORACIC ECHOCARDIOGRAM  08/05/2017   moderate LVH,  ef 55%,  grade 1 diastolic dysfunction/  mild LAE/  trivial Tr       Family History  Problem Relation Age of Onset  . Hypertension Mother   . Diabetes Mother   . Cancer Mother        breast cancer   . Hypertension Father     . Heart disease Father   . Stroke Father   . Hypertension Sister     Social History   Tobacco Use  . Smoking status: Former Smoker    Years: 45.00    Types: Cigarettes    Quit date: 03/03/2018    Years since quitting: 1.9  . Smokeless tobacco: Never Used  . Tobacco comment: 11-15-2017  per pt 1pp3days  Vaping Use  . Vaping Use: Never assessed  Substance Use Topics  . Alcohol use: Not Currently    Alcohol/week: 0.0 standard drinks    Comment: 11-15-2017  per pt last alcohol 12/ 2018  alcohol abuse  . Drug use: Not Currently    Types: "Crack" cocaine, Cocaine    Comment: 11-15-2017  per pt last used March 2019    Home Medications Prior to Admission medications   Medication Sig Start Date End Date Taking? Authorizing Provider  acetaminophen (TYLENOL) 500 MG tablet TAKE  ONE TABLET BY MOUTH TWICE A DAY AS NEEDED Patient taking differently: Take 500 mg by mouth 2 (two) times daily as needed.  05/19/19   Ladell Pier, MD  ASPIR-LOW 81 MG EC tablet TAKE 1 TABLET (81 MG TOTAL) BY MOUTH DAILY. 01/30/20   Ladell Pier, MD  atorvastatin (LIPITOR) 40 MG tablet TAKE 1.5 TABLETS BY MOUTH DAILY 02/10/20   Ladell Pier, MD  blood glucose meter kit and supplies KIT Dispense based on patient and insurance preference. Use once daily as directed. (E11.9) Patient taking differently: Inject 1 each into the skin See admin instructions. Dispense based on patient and insurance preference. Use once daily as directed. (E11.9) 12/23/19   Ladell Pier, MD  Blood Glucose Monitoring Suppl (ACCU-CHEK GUIDE ME) w/Device KIT 1 kit by Does not apply route 3 (three) times daily. Use as instructed to check blood sugar 3 times daily. E11.14 08/14/19   Ladell Pier, MD  dapagliflozin propanediol (FARXIGA) 10 MG TABS tablet Take 1 tablet (10 mg total) by mouth daily before breakfast. 02/12/20   Ladell Pier, MD  DULoxetine (CYMBALTA) 60 MG capsule TAKE ONE CAPSULE BY MOUTH DAILY FOR LEG AND  BACK PAINS 02/10/20   Ladell Pier, MD  furosemide (LASIX) 40 MG tablet TAKE ONE TABLET BY MOUTH DAILY . TAKE EXTRA TABLET FOR TWO DAYS AS NEEDED FOR INCREASE LOWER EXTREMITY SWELLING 02/10/20   Ladell Pier, MD  gabapentin (NEURONTIN) 300 MG capsule Take 1 capsule (300 mg total) by mouth 2 (two) times daily. 02/12/20   Ladell Pier, MD  glipiZIDE (GLUCOTROL XL) 10 MG 24 hr tablet Take 1 tablet (10 mg total) by mouth daily with breakfast. 02/12/20   Ladell Pier, MD  glucose blood (ACCU-CHEK GUIDE) test strip Use as instructed to check blood sugar 3 times daily. E11.14 Patient taking differently: 1 each by Other route See admin instructions. Use as instructed to check blood sugar 3 times daily. E11.14 08/14/19   Ladell Pier, MD  hydrALAZINE (APRESOLINE) 10 MG tablet TAKE ONE TABLET BY MOUTH TWICE A DAY 02/10/20   Ladell Pier, MD  labetalol (NORMODYNE) 100 MG tablet TAKE ONE TABLET BY MOUTH TWICE A DAY 02/10/20   Ladell Pier, MD  Lancets Misc. (ACCU-CHEK SOFTCLIX LANCET DEV) KIT Use as directed Patient taking differently: 1 each by Other route as directed.  12/19/17   Ladell Pier, MD  Lancets MISC Use as instructed to check blood sugar 3 times daily. E11.14 Patient taking differently: 1 each by Other route See admin instructions. Use as instructed to check blood sugar 3 times daily. E11.14 08/14/19   Ladell Pier, MD  lisinopril (ZESTRIL) 40 MG tablet Take 1 tablet (40 mg total) by mouth daily. 02/12/20   Ladell Pier, MD  loratadine (CLARITIN) 10 MG tablet TAKE ONE TABLET BY MOUTH DAILY AS NEEDED FOR ALLERGIES 02/12/20   Ladell Pier, MD  metFORMIN (GLUCOPHAGE) 1000 MG tablet TAKE ONE TABLET BY MOUTH TWICE A DAY WITH A MEAL. 01/23/20   Ladell Pier, MD  vitamin B-12 (CYANOCOBALAMIN) 500 MCG tablet Take 1 tablet (500 mcg total) by mouth daily. 02/13/20   Ladell Pier, MD    Allergies    Patient has no known allergies.  Review of  Systems   Review of Systems  Neurological: Positive for weakness.  All other systems reviewed and are negative.   Physical Exam Updated Vital Signs BP (!) 103/44  Pulse 79   Temp 98.5 F (36.9 C)   Resp 18   SpO2 95%   Physical Exam Vitals and nursing note reviewed.  Constitutional:      General: He is not in acute distress.    Appearance: He is well-developed. He is obese.     Comments: Drowsy but arousable.  Constantly yawning.  HENT:     Head: Atraumatic.     Mouth/Throat:     Mouth: Mucous membranes are moist.  Eyes:     Extraocular Movements: Extraocular movements intact.     Conjunctiva/sclera: Conjunctivae normal.     Pupils: Pupils are equal, round, and reactive to light.  Cardiovascular:     Rate and Rhythm: Normal rate and regular rhythm.     Pulses: Normal pulses.     Heart sounds: Normal heart sounds.  Pulmonary:     Effort: Pulmonary effort is normal.     Breath sounds: Normal breath sounds. No wheezing, rhonchi or rales.  Abdominal:     Palpations: Abdomen is soft.     Tenderness: There is no abdominal tenderness.  Musculoskeletal:     Cervical back: Normal range of motion and neck supple. No rigidity.     Comments: Global weakness, 4 out of 5 strength to all 4 extremities with noticeable left arm weakness compared to right.  Decreased grip strength in the left hand.  Skin:    Findings: No rash.  Neurological:     Mental Status: He is oriented to person, place, and time.     Comments: Neurologic exam:  Speech clear, pupils equal round reactive to light, extraocular movements intact  Normal peripheral visual fields Cranial nerves III through XII normal including no facial droop Follows commands, moves all extremities x4, decreased grip strength L>R, diminished sensation to both arms/legs but intact to face Poor finger to nose  pronator drift present Gait not tested   Psychiatric:        Mood and Affect: Mood normal.     ED Results /  Procedures / Treatments   Labs (all labs ordered are listed, but only abnormal results are displayed) Labs Reviewed  BASIC METABOLIC PANEL - Abnormal; Notable for the following components:      Result Value   Glucose, Bld 168 (*)    All other components within normal limits  CBG MONITORING, ED - Abnormal; Notable for the following components:   Glucose-Capillary 174 (*)    All other components within normal limits  CBC  URINALYSIS, ROUTINE W REFLEX MICROSCOPIC    EKG EKG Interpretation  Date/Time:  Wednesday February 25 2020 16:54:33 EDT Ventricular Rate:  97 PR Interval:  176 QRS Duration: 124 QT Interval:  372 QTC Calculation: 472 R Axis:   -75 Text Interpretation: Normal sinus rhythm Left anterior fascicular block Left ventricular hypertrophy with QRS widening ( R in aVL , Cornell product , Romhilt-Estes ) Abnormal ECG When compared to prior, similar apperance. No STEMI Confirmed by Antony Blackbird [phone]) on 02/26/2020 8:03:38 AM   Radiology MR Cervical Spine W or Wo Contrast  Result Date: 02/26/2020 CLINICAL DATA:  Bilateral arm and leg weakness. EXAM: MRI CERVICAL AND THORACIC SPINE WITHOUT AND WITH CONTRAST TECHNIQUE: Multiplanar and multiecho pulse sequences of the cervical spine, to include the craniocervical junction and cervicothoracic junction, and thoracic spine, were obtained without and with intravenous contrast. CONTRAST:  6m GADAVIST GADOBUTROL 1 MMOL/ML IV SOLN COMPARISON:  MRI cervical spine with contrast 04/27/2015. CT cervical spine 05/09/2019. CT cervical spine  12/26/2013 FINDINGS: MRI CERVICAL SPINE FINDINGS Alignment: Normal alignment Vertebrae: Negative for fracture or mass. Discogenic changes in the bone marrow at C5-6, C6-7, C7-T1. Mild bone marrow edema at C5-6 which appears degenerative. Findings are in not suggestive of discitis. Cord: Cord hyperintensity bilaterally at C5-6, left greater than right. This was present in 2016. There is cord compression at C3-4  and C4-5 due to circumferential narrowing of the canal. See below description. No other areas of cord signal abnormality. Posterior Fossa, vertebral arteries, paraspinal tissues: There is posterior ligamentous thickening and enhancement involving the ligamentum flavum at C3 and C4 left greater than right. In addition, there is low signal tissue within the ligamentum flavum on the left at C3 and C4 which does not enhance. This consistent with ligament calcification and was noted on the prior CT in 2020 but was not present in 2015 Disc levels: C2-3: Mild disc degeneration. Negative for spinal or foraminal stenosis C3-4: Disc degeneration with central and right-sided disc protrusion and associated spurring. Bilateral ligament thickening left greater than right which shows enhancement. There is moderate spinal stenosis with compression of the cord. There is mild to moderate foraminal stenosis bilaterally. C4-5: Disc degeneration with shallow central disc protrusion and diffuse uncinate spurring. Mild facet degeneration bilaterally. Moderate to severe spinal stenosis with compression of the cord left greater than right. Severe left foraminal encroachment and moderate right foraminal encroachment. Diffuse ligament thickening and enhancement. Calcification in the left ligamentum flavum is noted on prior CT. C5-6: Disc degeneration with diffuse uncinate facet degeneration. Spurring. Mild posterior ligament thickening and enhancement. Mild spinal stenosis. Cord hyperintensity bilaterally left greater than right. Moderate foraminal stenosis bilaterally C6-7: Disc degeneration with diffuse endplate spurring. Moderate foraminal encroachment bilaterally. C7-T1: Disc degeneration with edema in the endplates which show mild enhancement. There is diffuse endplate spurring and moderate foraminal stenosis bilaterally. MRI THORACIC SPINE FINDINGS Segmentation:  Normal Alignment:  Normal Vertebrae:  Negative for fracture or mass Spinal  cord: Normal signal and morphology. No cord compression. Mild vascular enhancement dorsal to the lower thoracic cord is most likely normal vessels. Paraspinal and other soft tissues: Negative for paraspinous mass or adenopathy. No paraspinous fluid collection. No pleural effusion. Disc levels: T1-2: Disc degeneration and spurring with foraminal narrowing bilaterally. T2-3: Mild spurring on the left without significant stenosis T3-4: Disc degeneration and spurring on the right with mild right foraminal narrowing T4-5: Mild disc degeneration. Right-sided facet degeneration and mild right foraminal narrowing. T7-8: Mild disc degeneration and small to moderate left foraminal disc protrusion. T8-9: Disc and facet degeneration. Mild foraminal narrowing bilaterally T9-10: Disc degeneration and spurring. Bilateral facet hypertrophy. Right foraminal narrowing due to spurring. T10-11: Disc degeneration and facet degeneration. Foraminal narrowing bilaterally due to spurring. T11-12: Disc and facet degeneration. Mild foraminal narrowing bilaterally due to spurring. IMPRESSION: 1. Moderate spinal stenosis and cord compression at C3-4 and C4-5. This is due to a combination of disc degeneration and spurring as well as prominent ligamentum flavum hypertrophy bilaterally left greater right. The ligament show edema and enhancement C3 through C5. In addition, there is posterior ligament calcification at C3-4 and C4-5 contributing to stenosis. Infection is a consideration, white blood count slightly elevated. However more likely is a inflammatory process in the ligaments causing edema and enlargement. Chronic calcification ligament is noted on prior CT. 2. 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 This is relatively mild and unchanged from 2016 MRI. 3. Multilevel thoracic degenerative changes causing foraminal stenosis bilaterally. No cord  compression. 4. These results were called by telephone at the time  of interpretation on 02/26/2020 at 4:03 pm to provider [facility] , who verbally acknowledged these results. Electronically Signed   By: Franchot Gallo M.D.   On: 02/26/2020 16:04   MR THORACIC SPINE W WO CONTRAST  Result Date: 02/26/2020 CLINICAL DATA:  Bilateral arm and leg weakness. EXAM: MRI CERVICAL AND THORACIC SPINE WITHOUT AND WITH CONTRAST TECHNIQUE: Multiplanar and multiecho pulse sequences of the cervical spine, to include the craniocervical junction and cervicothoracic junction, and thoracic spine, were obtained without and with intravenous contrast. CONTRAST:  57m GADAVIST GADOBUTROL 1 MMOL/ML IV SOLN COMPARISON:  MRI cervical spine with contrast 04/27/2015. CT cervical spine 05/09/2019. CT cervical spine 12/26/2013 FINDINGS: MRI CERVICAL SPINE FINDINGS Alignment: Normal alignment Vertebrae: Negative for fracture or mass. Discogenic changes in the bone marrow at C5-6, C6-7, C7-T1. Mild bone marrow edema at C5-6 which appears degenerative. Findings are in not suggestive of discitis. Cord: Cord hyperintensity bilaterally at C5-6, left greater than right. This was present in 2016. There is cord compression at C3-4 and C4-5 due to circumferential narrowing of the canal. See below description. No other areas of cord signal abnormality. Posterior Fossa, vertebral arteries, paraspinal tissues: There is posterior ligamentous thickening and enhancement involving the ligamentum flavum at C3 and C4 left greater than right. In addition, there is low signal tissue within the ligamentum flavum on the left at C3 and C4 which does not enhance. This consistent with ligament calcification and was noted on the prior CT in 2020 but was not present in 2015 Disc levels: C2-3: Mild disc degeneration. Negative for spinal or foraminal stenosis C3-4: Disc degeneration with central and right-sided disc protrusion and associated spurring. Bilateral ligament thickening left greater than right which shows enhancement. There is  moderate spinal stenosis with compression of the cord. There is mild to moderate foraminal stenosis bilaterally. C4-5: Disc degeneration with shallow central disc protrusion and diffuse uncinate spurring. Mild facet degeneration bilaterally. Moderate to severe spinal stenosis with compression of the cord left greater than right. Severe left foraminal encroachment and moderate right foraminal encroachment. Diffuse ligament thickening and enhancement. Calcification in the left ligamentum flavum is noted on prior CT. C5-6: Disc degeneration with diffuse uncinate facet degeneration. Spurring. Mild posterior ligament thickening and enhancement. Mild spinal stenosis. Cord hyperintensity bilaterally left greater than right. Moderate foraminal stenosis bilaterally C6-7: Disc degeneration with diffuse endplate spurring. Moderate foraminal encroachment bilaterally. C7-T1: Disc degeneration with edema in the endplates which show mild enhancement. There is diffuse endplate spurring and moderate foraminal stenosis bilaterally. MRI THORACIC SPINE FINDINGS Segmentation:  Normal Alignment:  Normal Vertebrae:  Negative for fracture or mass Spinal cord: Normal signal and morphology. No cord compression. Mild vascular enhancement dorsal to the lower thoracic cord is most likely normal vessels. Paraspinal and other soft tissues: Negative for paraspinous mass or adenopathy. No paraspinous fluid collection. No pleural effusion. Disc levels: T1-2: Disc degeneration and spurring with foraminal narrowing bilaterally. T2-3: Mild spurring on the left without significant stenosis T3-4: Disc degeneration and spurring on the right with mild right foraminal narrowing T4-5: Mild disc degeneration. Right-sided facet degeneration and mild right foraminal narrowing. T7-8: Mild disc degeneration and small to moderate left foraminal disc protrusion. T8-9: Disc and facet degeneration. Mild foraminal narrowing bilaterally T9-10: Disc degeneration and  spurring. Bilateral facet hypertrophy. Right foraminal narrowing due to spurring. T10-11: Disc degeneration and facet degeneration. Foraminal narrowing bilaterally due to spurring. T11-12: Disc and facet degeneration. Mild  foraminal narrowing bilaterally due to spurring. IMPRESSION: 1. Moderate spinal stenosis and cord compression at C3-4 and C4-5. This is due to a combination of disc degeneration and spurring as well as prominent ligamentum flavum hypertrophy bilaterally left greater right. The ligament show edema and enhancement C3 through C5. In addition, there is posterior ligament calcification at C3-4 and C4-5 contributing to stenosis. Infection is a consideration, white blood count slightly elevated. However more likely is a inflammatory process in the ligaments causing edema and enlargement. Chronic calcification ligament is noted on prior CT. 2. 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 This is relatively mild and unchanged from 2016 MRI. 3. Multilevel thoracic degenerative changes causing foraminal stenosis bilaterally. No cord compression. 4. These results were called by telephone at the time of interpretation on 02/26/2020 at 4:03 pm to provider [facility] , who verbally acknowledged these results. Electronically Signed   By: Franchot Gallo M.D.   On: 02/26/2020 16:04    Procedures .Critical Care Performed by: Domenic Moras, PA-C Authorized by: Domenic Moras, PA-C   Critical care provider statement:    Critical care time (minutes):  40   Critical care was time spent personally by me on the following activities:  Discussions with consultants, evaluation of patient's response to treatment, examination of patient, ordering and performing treatments and interventions, ordering and review of laboratory studies, ordering and review of radiographic studies, pulse oximetry, re-evaluation of patient's condition, obtaining history from patient or surrogate and review of old  charts   (including critical care time)  Medications Ordered in ED Medications  0.9 %  sodium chloride infusion (has no administration in time range)  enoxaparin (LOVENOX) injection 40 mg (has no administration in time range)  acetaminophen (TYLENOL) tablet 650 mg (has no administration in time range)    Or  acetaminophen (TYLENOL) suppository 650 mg (has no administration in time range)  oxyCODONE (Oxy IR/ROXICODONE) immediate release tablet 5 mg (has no administration in time range)  HYDROmorphone (DILAUDID) injection 0.5-1 mg (has no administration in time range)  ondansetron (ZOFRAN) tablet 4 mg (has no administration in time range)    Or  ondansetron (ZOFRAN) injection 4 mg (has no administration in time range)  gadobutrol (GADAVIST) 1 MMOL/ML injection 8 mL (8 mLs Intravenous Contrast Given 02/26/20 1424)    ED Course  I have reviewed the triage vital signs and the nursing notes.  Pertinent labs & imaging results that were available during my care of the patient were reviewed by me and considered in my medical decision making (see chart for details).    MDM Rules/Calculators/A&P                          BP 134/77   Pulse 86   Temp 98.5 F (36.9 C)   Resp 16   SpO2 100%   Final Clinical Impression(s) / ED Diagnoses Final diagnoses:  Cervical spinal cord compression (HCC)  Generalized weakness    Rx / DC Orders ED Discharge Orders    None     9:53 AM Patient with significant history of TIA and prior left-sided weakness who is here with complaints of generalized weakness and numbness throughout body with exception of his face and head.  Last known normal was 2 days ago.  Initially was found to be hypertensive with systolic blood pressure in the 170s however on repeat blood pressure it has been fairly soft at 103/44.  Will recheck blood  pressure.  Electrolyte panels are reassuring, EKG without significant changes.  Given his symptoms, I have consulted on-call neurology  who agrees to see and evaluate patient and will provide recommendation.  10:33 AM Recheck blood pressure is very normal at 135/77.  Review patient prior neurology note from 07/03/2019 pt has had hx of TIA vs conversion disorder.  Does have prior hx of spinal cord contusion.  Neurologist has evaluated pt and recommend MRI of cervical and thoracic w and w/o CM.    4:56 PM Cervical and thoracic spine MRI demonstrate moderate spinal stenosis and cord compression at C3 and 4 and C4-5.  This was felt to be a complications of degeneration and spurring.  Infection is a consideration however this is more likely to be an inflammatory process according to reading radiologist.  There are also chronic cord myelomalacia bilaterally at C5 and 6 left greater than right, unchanged from prior MRI in 2016.  This finding is consistent with patient presenting complaint.  I have discussed this with our neurologist and will also consult neurosurgeon for further evaluation.  Patient is currently without pain.  Is able to move all 4 extremities with difficulty and with noticeable weakness.  Patellar deep tendon reflexes intact.  Mild decreased rectal tone.  5:22 PM Appreciate consultation from neurosurgery team who agrees to see and evaluate patient and will recommend further management.  Will consult medicine for admission.  5:56 PM I have consulted [facility] Dr. Dan Maker who agrees to see and admit pt.  Currently awaits recommendation from Neurosurgery.  Neurology will defer further evaluation to neurosurgery but will be available for consultation if needed.  Pt is aware and agrees with plan.    Patient also was placed on a cervical hard collar per neurosurgery recommendation.  Quadry Devonne Doughty was evaluated in Emergency Department on 02/26/2020 for the symptoms described in the history of present illness. He was evaluated in the context of the global COVID-19 pandemic, which necessitated consideration that the  patient might be at risk for infection with the SARS-CoV-2 virus that causes COVID-19. Institutional protocols and algorithms that pertain to the evaluation of patients at risk for COVID-19 are in a state of rapid change based on information released by regulatory bodies including the CDC and federal and state organizations. These policies and algorithms were followed during the patient's care in the ED.    Domenic Moras, PA-C 02/26/20 1800    Tegeler, Gwenyth Allegra, MD 02/26/20 [id number]

## 2020-02-26 NOTE — Consult Note (Signed)
Reason for Consult: Weakness Referring Physician: Dr. Damien Fusi is an 59 y.o. male.  HPI: The patient is a 59 year old black male diabetic smoker with a known history of cervical myelopathy, central cord syndrome and substance abuse.  He was seen by Dr. Ronnald Ramp in 2015.  There was some discussion of cervical surgery back then.  He was treated with PT, OT, rehab and the plan was to follow-up in the office and discuss surgery.  The patient was lost to follow-up until 2017 when he saw Dr. Ronnald Ramp in the office.  Looking back at the records, over the ensuing years the patient has had progressive weakness, left greater than right.  He has had head CTs and brain MRIs which demonstrated diffuse atrophy.  The patient tells me that over the last 3 or 4 weeks he has had progressive weakness.  He does not recall any specific precipitating events.  He came to the ER today to be evaluated.  The evaluation included a cervical MRI which demonstrated cervical spinal stenosis and cord edema.  The patient is going to be admitted by the hospitalist and a neurosurgical consultation was requested.  Presently the patient is alert and pleasant.  He denies neck pain.  He complains of left greater than right numbness and weakness, particularly in his hands.  He denies perineal numbness, incontinence, etc.  He has no worse today than he was 3 or 4 weeks ago.  He is inconsistent in his social history.  The records state he quit drinking alcohol a year ago but he admits to me that he occasionally drinks.  The records also indicate he quit smoking but he admits he does continue to smoke.  He has a history of cocaine abuse.   Past Medical History:  Diagnosis Date  . Alcohol abuse    11-15-2017  per pt last alcohol Dec 2018  . BPH with obstruction/lower urinary tract symptoms   . Chronic arm pain   . Chronic pain    arms, leg, back  . CKD (chronic kidney disease), stage II   . Cocaine abuse ([place])     11-15-2017  per pt last used March 2019  . Congenital hydrocephalus, unspecified (HCC)    slow progression  . Diabetic retinopathy of both eyes ([place])   . Diastolic CHF, chronic ([place]) 07/2017  . Gait instability    multifactorial -- slow worsening congenital hydrocephalus, peripheral neuropathy, left C5-6 cord lesion  . Hematuria   . History of acute pulmonary edema 07/2017  . History of spinal cord injury 01/02/2014   pt fell, caused spinal cord contusion at C5-6--  residual left side weakness    . 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 History of TIA (transient ischemic attack) and stroke    hx cva 12-20-2010 and  TIA 02-22-2011--- no residual's from cva or tia  . Hyperlipidemia   . Hypertension    followed by pcp  . 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 Lesion of bladder   . Peripheral edema    chronic LUE  . Peripheral neuropathy   . Type 2 diabetes mellitus ([place])    followed by dr Karle Plumber--  last A1c 6.8 on 11-06-2017  . Weak urinary stream     Past Surgical History:  Procedure Laterality Date  . CARDIAC CATHETERIZATION  12/01/2008   dr hochrein   abnormal stress myoview:  mild coronary  plaque, normal LVF  . CARDIAC CATHETERIZATION  04/29/2011   dr Martinique   in setting ECG with new ST elevation & severe hypertensive:  nonobstructive atherosclerotic CAD, normal LVF (30% mRCA)   . CARDIOVASCULAR STRESS TEST  08/15/2010   Low nuclear study w/ no evidence ishemia/  ef 42% with lateral and apical hypokinesis  . CYSTOSCOPY WITH BIOPSY N/A 11/20/2017   Procedure: CYSTOSCOPY WITH BIOPSY AND FULGURATION;  Surgeon: [name], MD;  Location: [facility];  Service: Urology;  Laterality: N/A;  . INCISION AND DRAINAGE RIGHT DISTAL MEDIAL THIGH HEMATOMA  04-14-2005   dr Ninfa Linden  . left arm skin graft  1976   injury  . LEFT HEART CATHETERIZATION WITH CORONARY  ANGIOGRAM Bilateral 12/26/2013   Procedure: LEFT HEART CATHETERIZATION WITH CORONARY ANGIOGRAM;  Surgeon: Burnell Blanks, MD;  Location: [facility] CATH LAB;  Service: Cardiovascular;  Laterality: Bilateral;  STEMI, in setting cocaine/ alcohol :  mid disease in the RCA (20%), moderate disease in the small caliber intermediate branch (distal 50-60%), normal LVSF (ef 55-60%)  . TOOTH EXTRACTION N/A 05/16/2018   Procedure: DENTAL RESTORATION/EXTRACTIONS WITH ALVEO;  Surgeon: Diona Browner, DDS;  Location: [facility];  Service: Oral Surgery;  Laterality: N/A;  . TRANSTHORACIC ECHOCARDIOGRAM  08/05/2017   moderate LVH,  ef 55%,  grade 1 diastolic dysfunction/  mild LAE/  trivial Tr    Family History  Problem Relation Age of Onset  . Hypertension Mother   . Diabetes Mother   . Cancer Mother        breast cancer   . Hypertension Father   . Heart disease Father   . Stroke Father   . Hypertension Sister     Social History:  reports that he quit smoking about 1 years ago. His smoking use included cigarettes. He quit after 45.00 years of use. He has never used smokeless tobacco. He reports previous alcohol use. He reports previous drug use. Drugs: "Crack" cocaine and Cocaine.  Allergies: No Known Allergies  Medications:  I have reviewed the patient's current medications. Prior to Admission: (Not in a hospital admission)  Scheduled: . aspirin EC  81 mg Oral Daily  . atorvastatin  40 mg Oral Daily  . DULoxetine  60 mg Oral Daily  . enoxaparin (LOVENOX) injection  40 mg Subcutaneous Q24H  . furosemide  40 mg Oral Daily  . gabapentin  300 mg Oral BID  . hydrALAZINE  10 mg Oral BID  . [START ON 02/27/2020] insulin aspart  0-15 Units Subcutaneous TID WC  . insulin aspart  0-5 Units Subcutaneous QHS  . labetalol  100 mg Oral BID  . lisinopril  40 mg Oral Daily   Continuous: . sodium chloride     ONG:EXBMWUXLKGMWN **OR** acetaminophen, HYDROmorphone (DILAUDID) injection,  ondansetron **OR** ondansetron (ZOFRAN) IV, oxyCODONE Anti-infectives (From admission, onward)   None       Results for orders placed or performed during the hospital encounter of 02/25/20 (from the past 48 hour(s))  CBG monitoring, ED     Status: Abnormal   Collection Time: 02/25/20  4:58 PM  Result Value Ref Range   Glucose-Capillary 174 (H) 70 - 99 mg/dL    Comment: Glucose reference range applies only to samples taken after fasting for at least 8 hours.  Basic metabolic panel     Status: Abnormal   Collection Time: 02/25/20  5:09 PM  Result Value Ref Range   Sodium 138 135 - 145 mmol/L   Potassium  4.5 3.5 - 5.1 mmol/L   Chloride 105 98 - 111 mmol/L   CO2 25 22 - 32 mmol/L   Glucose, Bld 168 (H) 70 - 99 mg/dL    Comment: Glucose reference range applies only to samples taken after fasting for at least 8 hours.   BUN 13 8 - 23 mg/dL   Creatinine, Ser 1.20 0.61 - 1.24 mg/dL   Calcium 9.9 8.9 - 10.3 mg/dL   GFR calc non Af Amer >60 >60 mL/min   GFR calc Af Amer >60 >60 mL/min   Anion gap 8 5 - 15    Comment: Performed at [facility] [address] [place] [address]  CBC     Status: None   Collection Time: 02/25/20  5:09 PM  Result Value Ref Range   WBC 10.2 4.0 - 10.5 K/uL   RBC 5.40 4.22 - 5.81 MIL/uL   Hemoglobin 14.7 13.0 - 17.0 g/dL   HCT 46.4 39 - 52 %   MCV 85.9 80.0 - 100.0 fL   MCH 27.2 26.0 - 34.0 pg   MCHC 31.7 30.0 - 36.0 g/dL   RDW 14.3 11.5 - 15.5 %   Platelets 290 150 - 400 K/uL   nRBC 0.0 0.0 - 0.2 %    Comment: Performed at [facility], [place] [address] [place], [place] [address]    MR Cervical Spine W or Wo Contrast  Result Date: 02/26/2020 CLINICAL DATA:  Bilateral arm and leg weakness. EXAM: MRI CERVICAL AND THORACIC SPINE WITHOUT AND WITH CONTRAST TECHNIQUE: Multiplanar and multiecho pulse sequences of the cervical spine, to include the craniocervical junction and cervicothoracic junction, and thoracic spine, were obtained  without and with intravenous contrast. CONTRAST:  69mL GADAVIST GADOBUTROL 1 MMOL/ML IV SOLN COMPARISON:  MRI cervical spine with contrast 04/27/2015. CT cervical spine 05/09/2019. CT cervical spine 12/26/2013 FINDINGS: MRI CERVICAL SPINE FINDINGS Alignment: Normal alignment Vertebrae: Negative for fracture or mass. Discogenic changes in the bone marrow at C5-6, C6-7, C7-T1. Mild bone marrow edema at C5-6 which appears degenerative. Findings are in not suggestive of discitis. Cord: Cord hyperintensity bilaterally at C5-6, left greater than right. This was present in 2016. There is cord compression at C3-4 and C4-5 due to circumferential narrowing of the canal. See below description. No other areas of cord signal abnormality. Posterior Fossa, vertebral arteries, paraspinal tissues: There is posterior ligamentous thickening and enhancement involving the ligamentum flavum at C3 and C4 left greater than right. In addition, there is low signal tissue within the ligamentum flavum on the left at C3 and C4 which does not enhance. This consistent with ligament calcification and was noted on the prior CT in 2020 but was not present in 2015 Disc levels: C2-3: Mild disc degeneration. Negative for spinal or foraminal stenosis C3-4: Disc degeneration with central and right-sided disc protrusion and associated spurring. Bilateral ligament thickening left greater than right which shows enhancement. There is moderate spinal stenosis with compression of the cord. There is mild to moderate foraminal stenosis bilaterally. C4-5: Disc degeneration with shallow central disc protrusion and diffuse uncinate spurring. Mild facet degeneration bilaterally. Moderate to severe spinal stenosis with compression of the cord left greater than right. Severe left foraminal encroachment and moderate right foraminal encroachment. Diffuse ligament thickening and enhancement. Calcification in the left ligamentum flavum is noted on prior CT. C5-6: Disc  degeneration with diffuse uncinate facet degeneration. Spurring. Mild posterior ligament thickening and enhancement. Mild spinal stenosis. Cord hyperintensity bilaterally left  greater than right. Moderate foraminal stenosis bilaterally C6-7: Disc degeneration with diffuse endplate spurring. Moderate foraminal encroachment bilaterally. C7-T1: Disc degeneration with edema in the endplates which show mild enhancement. There is diffuse endplate spurring and moderate foraminal stenosis bilaterally. MRI THORACIC SPINE FINDINGS Segmentation:  Normal Alignment:  Normal Vertebrae:  Negative for fracture or mass Spinal cord: Normal signal and morphology. No cord compression. Mild vascular enhancement dorsal to the lower thoracic cord is most likely normal vessels. Paraspinal and other soft tissues: Negative for paraspinous mass or adenopathy. No paraspinous fluid collection. No pleural effusion. Disc levels: T1-2: Disc degeneration and spurring with foraminal narrowing bilaterally. T2-3: Mild spurring on the left without significant stenosis T3-4: Disc degeneration and spurring on the right with mild right foraminal narrowing T4-5: Mild disc degeneration. Right-sided facet degeneration and mild right foraminal narrowing. T7-8: Mild disc degeneration and small to moderate left foraminal disc protrusion. T8-9: Disc and facet degeneration. Mild foraminal narrowing bilaterally T9-10: Disc degeneration and spurring. Bilateral facet hypertrophy. Right foraminal narrowing due to spurring. T10-11: Disc degeneration and facet degeneration. Foraminal narrowing bilaterally due to spurring. T11-12: Disc and facet degeneration. Mild foraminal narrowing bilaterally due to spurring. IMPRESSION: 1. Moderate spinal stenosis and cord compression at C3-4 and C4-5. This is due to a combination of disc degeneration and spurring as well as prominent ligamentum flavum hypertrophy bilaterally left greater right. The ligament show edema and  enhancement C3 through C5. In addition, there is posterior ligament calcification at C3-4 and C4-5 contributing to stenosis. Infection is a consideration, white blood count slightly elevated. However more likely is a inflammatory process in the ligaments causing edema and enlargement. Chronic calcification ligament is noted on prior CT. 2. 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 This is relatively mild and unchanged from 2016 MRI. 3. Multilevel thoracic degenerative changes causing foraminal stenosis bilaterally. No cord compression. 4. These results were called by telephone at the time of interpretation on 02/26/2020 at 4:03 pm to provider [facility] , who verbally acknowledged these results. Electronically Signed   By: Franchot Gallo M.D.   On: 02/26/2020 16:04   MR THORACIC SPINE W WO CONTRAST  Result Date: 02/26/2020 CLINICAL DATA:  Bilateral arm and leg weakness. EXAM: MRI CERVICAL AND THORACIC SPINE WITHOUT AND WITH CONTRAST TECHNIQUE: Multiplanar and multiecho pulse sequences of the cervical spine, to include the craniocervical junction and cervicothoracic junction, and thoracic spine, were obtained without and with intravenous contrast. CONTRAST:  100mL GADAVIST GADOBUTROL 1 MMOL/ML IV SOLN COMPARISON:  MRI cervical spine with contrast 04/27/2015. CT cervical spine 05/09/2019. CT cervical spine 12/26/2013 FINDINGS: MRI CERVICAL SPINE FINDINGS Alignment: Normal alignment Vertebrae: Negative for fracture or mass. Discogenic changes in the bone marrow at C5-6, C6-7, C7-T1. Mild bone marrow edema at C5-6 which appears degenerative. Findings are in not suggestive of discitis. Cord: Cord hyperintensity bilaterally at C5-6, left greater than right. This was present in 2016. There is cord compression at C3-4 and C4-5 due to circumferential narrowing of the canal. See below description. No other areas of cord signal abnormality. Posterior Fossa, vertebral arteries, paraspinal tissues:  There is posterior ligamentous thickening and enhancement involving the ligamentum flavum at C3 and C4 left greater than right. In addition, there is low signal tissue within the ligamentum flavum on the left at C3 and C4 which does not enhance. This consistent with ligament calcification and was noted on the prior CT in 2020 but was not present in 2015 Disc levels: C2-3: Mild disc  degeneration. Negative for spinal or foraminal stenosis C3-4: Disc degeneration with central and right-sided disc protrusion and associated spurring. Bilateral ligament thickening left greater than right which shows enhancement. There is moderate spinal stenosis with compression of the cord. There is mild to moderate foraminal stenosis bilaterally. C4-5: Disc degeneration with shallow central disc protrusion and diffuse uncinate spurring. Mild facet degeneration bilaterally. Moderate to severe spinal stenosis with compression of the cord left greater than right. Severe left foraminal encroachment and moderate right foraminal encroachment. Diffuse ligament thickening and enhancement. Calcification in the left ligamentum flavum is noted on prior CT. C5-6: Disc degeneration with diffuse uncinate facet degeneration. Spurring. Mild posterior ligament thickening and enhancement. Mild spinal stenosis. Cord hyperintensity bilaterally left greater than right. Moderate foraminal stenosis bilaterally C6-7: Disc degeneration with diffuse endplate spurring. Moderate foraminal encroachment bilaterally. C7-T1: Disc degeneration with edema in the endplates which show mild enhancement. There is diffuse endplate spurring and moderate foraminal stenosis bilaterally. MRI THORACIC SPINE FINDINGS Segmentation:  Normal Alignment:  Normal Vertebrae:  Negative for fracture or mass Spinal cord: Normal signal and morphology. No cord compression. Mild vascular enhancement dorsal to the lower thoracic cord is most likely normal vessels. Paraspinal and other soft  tissues: Negative for paraspinous mass or adenopathy. No paraspinous fluid collection. No pleural effusion. Disc levels: T1-2: Disc degeneration and spurring with foraminal narrowing bilaterally. T2-3: Mild spurring on the left without significant stenosis T3-4: Disc degeneration and spurring on the right with mild right foraminal narrowing T4-5: Mild disc degeneration. Right-sided facet degeneration and mild right foraminal narrowing. T7-8: Mild disc degeneration and small to moderate left foraminal disc protrusion. T8-9: Disc and facet degeneration. Mild foraminal narrowing bilaterally T9-10: Disc degeneration and spurring. Bilateral facet hypertrophy. Right foraminal narrowing due to spurring. T10-11: Disc degeneration and facet degeneration. Foraminal narrowing bilaterally due to spurring. T11-12: Disc and facet degeneration. Mild foraminal narrowing bilaterally due to spurring. IMPRESSION: 1. Moderate spinal stenosis and cord compression at C3-4 and C4-5. This is due to a combination of disc degeneration and spurring as well as prominent ligamentum flavum hypertrophy bilaterally left greater right. The ligament show edema and enhancement C3 through C5. In addition, there is posterior ligament calcification at C3-4 and C4-5 contributing to stenosis. Infection is a consideration, white blood count slightly elevated. However more likely is a inflammatory process in the ligaments causing edema and enlargement. Chronic calcification ligament is noted on prior CT. 2. 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 This is relatively mild and unchanged from 2016 MRI. 3. Multilevel thoracic degenerative changes causing foraminal stenosis bilaterally. No cord compression. 4. These results were called by telephone at the time of interpretation on 02/26/2020 at 4:03 pm to provider [facility] , who verbally acknowledged these results. Electronically Signed   By: Franchot Gallo M.D.   On: 02/26/2020  16:04    ROS: As above Blood pressure (!) 152/96, pulse 79, temperature 98.5 F (36.9 C), resp. rate 18, SpO2 99 %. Estimated body mass index is 30.65 kg/m as calculated from the following:   Height as of 02/01/20: 6\' 3"  (1.905 m).   Weight as of 02/12/20: 111.2 kg.  Physical Exam  General: A 60 year old obese unkempt black male in a cervical collar in no apparent distress  HEENT: Normocephalic, atraumatic, extraocular muscles are intact  Neck: No obvious deformities.  He is wearing a hard cervical collar.  Thorax: Symmetric  Abdomen: Obese  Extremities: Unremarkable  Neurologic exam: The patient is alert and oriented  x3.  Cranial nerves II through XII were examined bilaterally and grossly normal.  The patient's motor strength is approximately 4+/5 in his bilateral bicep, tricep, handgrip, quadricep, gastrocnemius, and dorsiflexors although he does giveaway diffusely.  Cerebellar function is intact to rapid alternating movements of the upper extremities bilaterally.  Sensory function is intact to light touch sensation all tested dermatomes bilaterally.  The patient has bilateral hand intrinsic atrophy, left greater than right.  The patient has sustained ankle clonus on the left.  I have reviewed the patient's cervical MRI [facility].  He has a straightened cervical spine.  He has an old area of spinal cord hyperintensity at C5-6.  At C3-4 and C4-5 he has severe spinal stenosis from a combination of ventral spondylosis and hypertrophy of the ligamentum flavum.  There is subtle increased cord edema at C3-4 and C4-5  Assessment/Plan: Cervical spinal stenosis, cervical myelopathy: I have discussed the situation with the patient.  I have told him he has chronic appearing changes in his cervical spine which likely account for his diffuse weakness.  The fact that has had previous brain scans for weakness and he has hand atrophy and sustained ankle clonus further  indicates a chronic process.  Ultimately he would likely benefit from surgery, likely a posterior decompression.  He does not need the surgery urgently as this is a chronic ongoing process.  He is going to be admitted.  We will have PT and OT worked up with the patient and start him on low-dose Decadron, given the fact that he is diabetic.  Dr. Ronnald Ramp will follow up with him next week to determine the long-term plans.  I would recommend continuing his cervical collar for now.  Ophelia Charter 02/26/2020, 6:57 PM

## 2020-02-26 NOTE — H&P (Signed)
History and Physical    [name] BUL:[id number] DOB: 1960-07-30 DOA: 02/25/2020  PCP: Ladell Pier, MD  Patient coming from: home  I have personally briefly reviewed patient's old medical records in [place]  Chief Complaint: Numbness and weakness in bilateral upper and lower extremities  HPI: [name] is a 59 y.o. male with medical history significant of hypertension, hyperlipidemia, diabetes mellitus, TIA, diabetic neuropathy, chronic diastolic CHF, cocaine abuse, tobacco abuse, presents to emergency department with worsening numbness and weakness in bilateral upper and lower extremities since 1 day.  Patient tells me that he has diabetic neuropathy however since yesterday he has started feeling worsening of his numbness and tingling and weakness sensation in bilateral upper and lower extremities.  Tells me that he has difficulty lifting while picking up his utensils, unable to walk due to weakness.  Reports that his symptoms are persistent, no aggravating or relieving factors.  Denies association with recent trauma, fever, chills, headache, blurry vision, urinary, bowel incontinence.  Denies chest pain, shortness of breath, palpitation, leg swelling, dizziness, lightheadedness, nausea, vomiting, abdominal pain, sleep or appetite changes.  He smokes cigarettes every day, denies alcohol use however uses cocaine now and then.  His last cocaine use was 2 days ago.  He lives alone at home and uses cane and walker for ambulation.  ED Course: Upon arrival to ED: Patient's vital signs stable, afebrile, initial labs such as CBC, CMP: WNL.  MRI cervical: Thoracic spine shows moderate spinal stenosis and cord compression at C3-C4 and C4-C5.  Multilevel degenerative changes noted in thoracic spine.  EDP talk to neurosurgery.  [facility] hospitalist consulted for admission for cervical spinal stenosis with cord compression.  Review of Systems: As per HPI otherwise negative.     Past Medical History:  Diagnosis Date  . Alcohol abuse    11-15-2017  per pt last alcohol Dec 2018  . BPH with obstruction/lower urinary tract symptoms   . Chronic arm pain   . Chronic pain    arms, leg, back  . CKD (chronic kidney disease), stage II   . Cocaine abuse ([place])    11-15-2017  per pt last used March 2019  . Congenital hydrocephalus, unspecified (HCC)    slow progression  . Diabetic retinopathy of both eyes ([place])   . Diastolic CHF, chronic ([place]) 07/2017  . Gait instability    multifactorial -- slow worsening congenital hydrocephalus, peripheral neuropathy, left C5-6 cord lesion  . Hematuria   . History of acute pulmonary edema 07/2017  . History of spinal cord injury 01/02/2014   pt fell, caused spinal cord contusion at C5-6--  residual left side weakness    . 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 History of TIA (transient ischemic attack) and stroke    hx cva 12-20-2010 and  TIA 02-22-2011--- no residual's from cva or tia  . Hyperlipidemia   . Hypertension    followed by pcp  . 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 Lesion of bladder   . Peripheral edema    chronic LUE  . Peripheral neuropathy   . Type 2 diabetes mellitus ([place])    followed by dr Karle Plumber--  last A1c 6.8 on 11-06-2017  . Weak urinary stream     Past Surgical History:  Procedure Laterality Date  . CARDIAC CATHETERIZATION  12/01/2008   dr hochrein   abnormal stress myoview:  mild coronary plaque, normal LVF  . CARDIAC CATHETERIZATION  04/29/2011   dr [place]   in setting ECG with new ST elevation & severe hypertensive:  nonobstructive atherosclerotic CAD, normal LVF (30% mRCA)   . CARDIOVASCULAR STRESS TEST  08/15/2010   Low nuclear study w/ no evidence ishemia/  ef 42% with lateral and apical hypokinesis  . CYSTOSCOPY WITH BIOPSY N/A 11/20/2017   Procedure: CYSTOSCOPY WITH BIOPSY AND  FULGURATION;  Surgeon: [name], MD;  Location[facility];  Service: Urology;  Laterality: N/A;  . INCISION AND DRAINAGE RIGHT DISTAL MEDIAL THIGH HEMATOMA  04-14-2005   dr Ninfa Linden  . left arm skin graft  1976   injury  . LEFT HEART CATHETERIZATION WITH CORONARY ANGIOGRAM Bilateral 12/26/2013   Procedure: LEFT HEART CATHETERIZATION WITH CORONARY ANGIOGRAM;  Surgeon: Burnell Blanks, MD;  Location: [facility] CATH LAB;  Service: Cardiovascular;  Laterality: Bilateral;  STEMI, in setting cocaine/ alcohol :  mid disease in the RCA (20%), moderate disease in the small caliber intermediate branch (distal 50-60%), normal LVSF (ef 55-60%)  . TOOTH EXTRACTION N/A 05/16/2018   Procedure: DENTAL RESTORATION/EXTRACTIONS WITH ALVEO;  Surgeon: Diona Browner, DDS;  Location: [place];  Service: Oral Surgery;  Laterality: N/A;  . TRANSTHORACIC ECHOCARDIOGRAM  08/05/2017   moderate LVH,  ef 55%,  grade 1 diastolic dysfunction/  mild LAE/  trivial Tr     reports that he quit smoking about 1 years ago. His smoking use included cigarettes. He quit after 45.00 years of use. He has never used smokeless tobacco. He reports previous alcohol use. He reports previous drug use. Drugs: "Crack" cocaine and Cocaine.  No Known Allergies  Family History  Problem Relation Age of Onset  . Hypertension Mother   . Diabetes Mother   . Cancer Mother        breast cancer   . Hypertension Father   . Heart disease Father   . Stroke Father   . Hypertension Sister     Prior to Admission medications   Medication Sig Start Date End Date Taking? Authorizing Provider  acetaminophen (TYLENOL) 500 MG tablet TAKE ONE TABLET BY MOUTH TWICE A DAY AS NEEDED Patient taking differently: Take 500 mg by mouth 2 (two) times daily as needed.  05/19/19   Ladell Pier, MD  ASPIR-LOW 81 MG EC tablet TAKE 1 TABLET (81 MG TOTAL) BY MOUTH DAILY. 01/30/20   Ladell Pier, MD  atorvastatin (LIPITOR) 40 MG  tablet TAKE 1.5 TABLETS BY MOUTH DAILY 02/10/20   Ladell Pier, MD  blood glucose meter kit and supplies KIT Dispense based on patient and insurance preference. Use once daily as directed. (E11.9) Patient taking differently: Inject 1 each into the skin See admin instructions. Dispense based on patient and insurance preference. Use once daily as directed. (E11.9) 12/23/19   Ladell Pier, MD  Blood Glucose Monitoring Suppl (ACCU-CHEK GUIDE ME) w/Device KIT 1 kit by Does not apply route 3 (three) times daily. Use as instructed to check blood sugar 3 times daily. E11.14 08/14/19   Ladell Pier, MD  dapagliflozin propanediol (FARXIGA) 10 MG TABS tablet Take 1 tablet (10 mg total) by mouth daily before breakfast. 02/12/20   Ladell Pier, MD  DULoxetine (CYMBALTA) 60 MG capsule TAKE ONE CAPSULE BY MOUTH DAILY FOR LEG AND BACK PAINS 02/10/20   Ladell Pier, MD  furosemide (LASIX) 40 MG tablet TAKE ONE TABLET BY MOUTH DAILY . TAKE EXTRA TABLET FOR  TWO DAYS AS NEEDED FOR INCREASE LOWER EXTREMITY SWELLING 02/10/20   Ladell Pier, MD  gabapentin (NEURONTIN) 300 MG capsule Take 1 capsule (300 mg total) by mouth 2 (two) times daily. 02/12/20   Ladell Pier, MD  glipiZIDE (GLUCOTROL XL) 10 MG 24 hr tablet Take 1 tablet (10 mg total) by mouth daily with breakfast. 02/12/20   Ladell Pier, MD  glucose blood (ACCU-CHEK GUIDE) test strip Use as instructed to check blood sugar 3 times daily. E11.14 Patient taking differently: 1 each by Other route See admin instructions. Use as instructed to check blood sugar 3 times daily. E11.14 08/14/19   Ladell Pier, MD  hydrALAZINE (APRESOLINE) 10 MG tablet TAKE ONE TABLET BY MOUTH TWICE A DAY 02/10/20   Ladell Pier, MD  labetalol (NORMODYNE) 100 MG tablet TAKE ONE TABLET BY MOUTH TWICE A DAY 02/10/20   Ladell Pier, MD  Lancets Misc. (ACCU-CHEK SOFTCLIX LANCET DEV) KIT Use as directed Patient taking differently: 1 each by Other  route as directed.  12/19/17   Ladell Pier, MD  Lancets MISC Use as instructed to check blood sugar 3 times daily. E11.14 Patient taking differently: 1 each by Other route See admin instructions. Use as instructed to check blood sugar 3 times daily. E11.14 08/14/19   Ladell Pier, MD  lisinopril (ZESTRIL) 40 MG tablet Take 1 tablet (40 mg total) by mouth daily. 02/12/20   Ladell Pier, MD  loratadine (CLARITIN) 10 MG tablet TAKE ONE TABLET BY MOUTH DAILY AS NEEDED FOR ALLERGIES 02/12/20   Ladell Pier, MD  metFORMIN (GLUCOPHAGE) 1000 MG tablet TAKE ONE TABLET BY MOUTH TWICE A DAY WITH A MEAL. 01/23/20   Ladell Pier, MD  vitamin B-12 (CYANOCOBALAMIN) 500 MCG tablet Take 1 tablet (500 mcg total) by mouth daily. 02/13/20   Ladell Pier, MD    Physical Exam: Vitals:   02/26/20 1530 02/26/20 1545 02/26/20 1615 02/26/20 1730  BP: 130/60 114/62 (!) 148/72 134/77  Pulse: 77 82 82 86  Resp:  18 16   Temp:      SpO2: 97% 96% 98% 100%    Constitutional: NAD, calm, comfortable, on room air, obese, has cervical collar Eyes: PERRL, lids and conjunctivae normal ENMT: Mucous membranes are moist. Posterior pharynx clear of any exudate or lesions.Normal dentition.  Neck: Has cervical collar respiratory: clear to auscultation bilaterally, no wheezing, no crackles. Normal respiratory effort. No accessory muscle use.  Cardiovascular: Regular rate and rhythm, no murmurs / rubs / gallops. No extremity edema. 2+ pedal pulses. No carotid bruits.  Abdomen: no tenderness, no masses palpated. No hepatosplenomegaly. Bowel sounds positive.  Musculoskeletal: no clubbing / cyanosis. No joint deformity upper and lower extremities. Good ROM, no contractures. Normal muscle tone.  Skin: no rashes, lesions, ulcers. No induration Neurologic: CN 2-12 grossly intact. Sensation intact, power 3 out of 5 in bilateral upper and lower extremities.   Psychiatric: Normal judgment and insight. Alert and  oriented x 3. Normal mood.    Labs on Admission: I have personally reviewed following labs and imaging studies  CBC: Recent Labs  Lab 02/25/20 1709  WBC 10.2  HGB 14.7  HCT 46.4  MCV 85.9  PLT 500   Basic Metabolic Panel: Recent Labs  Lab 02/25/20 1709  NA 138  K 4.5  CL 105  CO2 25  GLUCOSE 168*  BUN 13  CREATININE 1.20  CALCIUM 9.9   GFR: CrCl cannot be calculated (Unknown ideal  weight.). Liver Function Tests: No results for input(s): AST, ALT, ALKPHOS, BILITOT, PROT, ALBUMIN in the last 168 hours. No results for input(s): LIPASE, AMYLASE in the last 168 hours. No results for input(s): AMMONIA in the last 168 hours. Coagulation Profile: No results for input(s): INR, PROTIME in the last 168 hours. Cardiac Enzymes: No results for input(s): CKTOTAL, CKMB, CKMBINDEX, TROPONINI in the last 168 hours. BNP (last 3 results) No results for input(s): PROBNP in the last 8760 hours. HbA1C: No results for input(s): HGBA1C in the last 72 hours. CBG: Recent Labs  Lab 02/25/20 1658  GLUCAP 174*   Lipid Profile: No results for input(s): CHOL, HDL, LDLCALC, TRIG, CHOLHDL, LDLDIRECT in the last 72 hours. Thyroid Function Tests: No results for input(s): TSH, T4TOTAL, FREET4, T3FREE, THYROIDAB in the last 72 hours. Anemia Panel: No results for input(s): VITAMINB12, FOLATE, FERRITIN, TIBC, IRON, RETICCTPCT in the last 72 hours. Urine analysis:    Component Value Date/Time   COLORURINE STRAW (A) 02/01/2020 1436   APPEARANCEUR CLEAR 02/01/2020 1436   LABSPEC 1.013 02/01/2020 1436   PHURINE 5.0 02/01/2020 1436   GLUCOSEU >=500 (A) 02/01/2020 1436   HGBUR SMALL (A) 02/01/2020 1436   BILIRUBINUR NEGATIVE 02/01/2020 1436   KETONESUR NEGATIVE 02/01/2020 1436   PROTEINUR NEGATIVE 02/01/2020 1436   UROBILINOGEN 0.2 04/27/2015 1008   NITRITE NEGATIVE 02/01/2020 1436   LEUKOCYTESUR NEGATIVE 02/01/2020 1436    Radiological Exams on Admission: MR Cervical Spine W or Wo  Contrast  Result Date: 02/26/2020 CLINICAL DATA:  Bilateral arm and leg weakness. EXAM: MRI CERVICAL AND THORACIC SPINE WITHOUT AND WITH CONTRAST TECHNIQUE: Multiplanar and multiecho pulse sequences of the cervical spine, to include the craniocervical junction and cervicothoracic junction, and thoracic spine, were obtained without and with intravenous contrast. CONTRAST:  103m GADAVIST GADOBUTROL 1 MMOL/ML IV SOLN COMPARISON:  MRI cervical spine with contrast 04/27/2015. CT cervical spine 05/09/2019. CT cervical spine 12/26/2013 FINDINGS: MRI CERVICAL SPINE FINDINGS Alignment: Normal alignment Vertebrae: Negative for fracture or mass. Discogenic changes in the bone marrow at C5-6, C6-7, C7-T1. Mild bone marrow edema at C5-6 which appears degenerative. Findings are in not suggestive of discitis. Cord: Cord hyperintensity bilaterally at C5-6, left greater than right. This was present in 2016. There is cord compression at C3-4 and C4-5 due to circumferential narrowing of the canal. See below description. No other areas of cord signal abnormality. Posterior Fossa, vertebral arteries, paraspinal tissues: There is posterior ligamentous thickening and enhancement involving the ligamentum flavum at C3 and C4 left greater than right. In addition, there is low signal tissue within the ligamentum flavum on the left at C3 and C4 which does not enhance. This consistent with ligament calcification and was noted on the prior CT in 2020 but was not present in 2015 Disc levels: C2-3: Mild disc degeneration. Negative for spinal or foraminal stenosis C3-4: Disc degeneration with central and right-sided disc protrusion and associated spurring. Bilateral ligament thickening left greater than right which shows enhancement. There is moderate spinal stenosis with compression of the cord. There is mild to moderate foraminal stenosis bilaterally. C4-5: Disc degeneration with shallow central disc protrusion and diffuse uncinate spurring. Mild  facet degeneration bilaterally. Moderate to severe spinal stenosis with compression of the cord left greater than right. Severe left foraminal encroachment and moderate right foraminal encroachment. Diffuse ligament thickening and enhancement. Calcification in the left ligamentum flavum is noted on prior CT. C5-6: Disc degeneration with diffuse uncinate facet degeneration. Spurring. Mild posterior ligament thickening and enhancement. Mild  spinal stenosis. Cord hyperintensity bilaterally left greater than right. Moderate foraminal stenosis bilaterally C6-7: Disc degeneration with diffuse endplate spurring. Moderate foraminal encroachment bilaterally. C7-T1: Disc degeneration with edema in the endplates which show mild enhancement. There is diffuse endplate spurring and moderate foraminal stenosis bilaterally. MRI THORACIC SPINE FINDINGS Segmentation:  Normal Alignment:  Normal Vertebrae:  Negative for fracture or mass Spinal cord: Normal signal and morphology. No cord compression. Mild vascular enhancement dorsal to the lower thoracic cord is most likely normal vessels. Paraspinal and other soft tissues: Negative for paraspinous mass or adenopathy. No paraspinous fluid collection. No pleural effusion. Disc levels: T1-2: Disc degeneration and spurring with foraminal narrowing bilaterally. T2-3: Mild spurring on the left without significant stenosis T3-4: Disc degeneration and spurring on the right with mild right foraminal narrowing T4-5: Mild disc degeneration. Right-sided facet degeneration and mild right foraminal narrowing. T7-8: Mild disc degeneration and small to moderate left foraminal disc protrusion. T8-9: Disc and facet degeneration. Mild foraminal narrowing bilaterally T9-10: Disc degeneration and spurring. Bilateral facet hypertrophy. Right foraminal narrowing due to spurring. T10-11: Disc degeneration and facet degeneration. Foraminal narrowing bilaterally due to spurring. T11-12: Disc and facet  degeneration. Mild foraminal narrowing bilaterally due to spurring. IMPRESSION: 1. Moderate spinal stenosis and cord compression at C3-4 and C4-5. This is due to a combination of disc degeneration and spurring as well as prominent ligamentum flavum hypertrophy bilaterally left greater right. The ligament show edema and enhancement C3 through C5. In addition, there is posterior ligament calcification at C3-4 and C4-5 contributing to stenosis. Infection is a consideration, white blood count slightly elevated. However more likely is a inflammatory process in the ligaments causing edema and enlargement. Chronic calcification ligament is noted on prior CT. 2. 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 This is relatively mild and unchanged from 2016 MRI. 3. Multilevel thoracic degenerative changes causing foraminal stenosis bilaterally. No cord compression. 4. These results were called by telephone at the time of interpretation on 02/26/2020 at 4:03 pm to provider [facility] , who verbally acknowledged these results. Electronically Signed   By: Franchot Gallo M.D.   On: 02/26/2020 16:04   MR THORACIC SPINE W WO CONTRAST  Result Date: 02/26/2020 CLINICAL DATA:  Bilateral arm and leg weakness. EXAM: MRI CERVICAL AND THORACIC SPINE WITHOUT AND WITH CONTRAST TECHNIQUE: Multiplanar and multiecho pulse sequences of the cervical spine, to include the craniocervical junction and cervicothoracic junction, and thoracic spine, were obtained without and with intravenous contrast. CONTRAST:  1m GADAVIST GADOBUTROL 1 MMOL/ML IV SOLN COMPARISON:  MRI cervical spine with contrast 04/27/2015. CT cervical spine 05/09/2019. CT cervical spine 12/26/2013 FINDINGS: MRI CERVICAL SPINE FINDINGS Alignment: Normal alignment Vertebrae: Negative for fracture or mass. Discogenic changes in the bone marrow at C5-6, C6-7, C7-T1. Mild bone marrow edema at C5-6 which appears degenerative. Findings are in not suggestive of  discitis. Cord: Cord hyperintensity bilaterally at C5-6, left greater than right. This was present in 2016. There is cord compression at C3-4 and C4-5 due to circumferential narrowing of the canal. See below description. No other areas of cord signal abnormality. Posterior Fossa, vertebral arteries, paraspinal tissues: There is posterior ligamentous thickening and enhancement involving the ligamentum flavum at C3 and C4 left greater than right. In addition, there is low signal tissue within the ligamentum flavum on the left at C3 and C4 which does not enhance. This consistent with ligament calcification and was noted on the prior CT in 2020 but was not present in  2015 Disc levels: C2-3: Mild disc degeneration. Negative for spinal or foraminal stenosis C3-4: Disc degeneration with central and right-sided disc protrusion and associated spurring. Bilateral ligament thickening left greater than right which shows enhancement. There is moderate spinal stenosis with compression of the cord. There is mild to moderate foraminal stenosis bilaterally. C4-5: Disc degeneration with shallow central disc protrusion and diffuse uncinate spurring. Mild facet degeneration bilaterally. Moderate to severe spinal stenosis with compression of the cord left greater than right. Severe left foraminal encroachment and moderate right foraminal encroachment. Diffuse ligament thickening and enhancement. Calcification in the left ligamentum flavum is noted on prior CT. C5-6: Disc degeneration with diffuse uncinate facet degeneration. Spurring. Mild posterior ligament thickening and enhancement. Mild spinal stenosis. Cord hyperintensity bilaterally left greater than right. Moderate foraminal stenosis bilaterally C6-7: Disc degeneration with diffuse endplate spurring. Moderate foraminal encroachment bilaterally. C7-T1: Disc degeneration with edema in the endplates which show mild enhancement. There is diffuse endplate spurring and moderate  foraminal stenosis bilaterally. MRI THORACIC SPINE FINDINGS Segmentation:  Normal Alignment:  Normal Vertebrae:  Negative for fracture or mass Spinal cord: Normal signal and morphology. No cord compression. Mild vascular enhancement dorsal to the lower thoracic cord is most likely normal vessels. Paraspinal and other soft tissues: Negative for paraspinous mass or adenopathy. No paraspinous fluid collection. No pleural effusion. Disc levels: T1-2: Disc degeneration and spurring with foraminal narrowing bilaterally. T2-3: Mild spurring on the left without significant stenosis T3-4: Disc degeneration and spurring on the right with mild right foraminal narrowing T4-5: Mild disc degeneration. Right-sided facet degeneration and mild right foraminal narrowing. T7-8: Mild disc degeneration and small to moderate left foraminal disc protrusion. T8-9: Disc and facet degeneration. Mild foraminal narrowing bilaterally T9-10: Disc degeneration and spurring. Bilateral facet hypertrophy. Right foraminal narrowing due to spurring. T10-11: Disc degeneration and facet degeneration. Foraminal narrowing bilaterally due to spurring. T11-12: Disc and facet degeneration. Mild foraminal narrowing bilaterally due to spurring. IMPRESSION: 1. Moderate spinal stenosis and cord compression at C3-4 and C4-5. This is due to a combination of disc degeneration and spurring as well as prominent ligamentum flavum hypertrophy bilaterally left greater right. The ligament show edema and enhancement C3 through C5. In addition, there is posterior ligament calcification at C3-4 and C4-5 contributing to stenosis. Infection is a consideration, white blood count slightly elevated. However more likely is a inflammatory process in the ligaments causing edema and enlargement. Chronic calcification ligament is noted on prior CT. 2. 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 This is relatively mild and unchanged from 2016 MRI. 3.  Multilevel thoracic degenerative changes causing foraminal stenosis bilaterally. No cord compression. 4. These results were called by telephone at the time of interpretation on 02/26/2020 at 4:03 pm to provider [facility] , who verbally acknowledged these results. Electronically Signed   By: Franchot Gallo M.D.   On: 02/26/2020 16:04    EKG: Independently reviewed.  Normal sinus rhythm, left anterior fascicular block.  LVH no ST elevation or depression noted.  Assessment/Plan Principal Problem:   Cervical spinal cord compression (HCC) Active Problems:   Hyperlipidemia associated with type 2 diabetes mellitus ([place])   Essential hypertension   Cocaine abuse ([place])   Type 2 diabetes mellitus (HCC)   Peripheral neuropathy   Diastolic congestive heart failure ([place])   Spinal stenosis, cervical region    Cervical spinal cord compression: -Patient presented with worsening numbness tingling and weakness in bilateral upper and lower extremities.  He is afebrile with no  leukocytosis.  Initial labs such as CBC, CMP: WNL.  Reviewed MRI of cervical spine and thoracic spine. -Repeat CBC, CMP, A1c, magnesium, check COVID-19 -Admit patient on the floor. -EDP consulted neurosurgery-await recommendations -Tylenol/oxycodone/Dilaudid as needed for pain control.  Hypertension: Blood pressure is slightly elevated -We will continue home meds hydralazine, labetalol, lisinopril -Monitor blood pressure closely  Type 2 diabetes mellitus: Check A1c -Hold Metformin, Farxiga and glipizide and start patient on sliding scale insulin.  Monitor blood sugar closely  Hyperlipidemia: Continue statin  Diabetic neuropathy: Continue Cymbalta and gabapentin  Chronic diastolic CHF: Patient appears euvolemic on exam. -Continue Lasix 40 mg as needed, statin, labetalol, lisinopril -Strict INO's and daily weight.  Monitor signs for fluid overload.  Check electrolytes  Cocaine abuse/tobacco abuse/alcohol abuse: Counseled about  cessation and he verbalized understanding.  DVT prophylaxis: Lovenox/SCD Code Status: Full code  family Communication: None present at bedside.  Plan of care discussed with patient in length and he verbalized understanding and agreed with it. Disposition Plan: Rehab/home in 2 to 3 days  consults called: Neurosurgery by EDP Admission status: Inpatient   Mckinley Jewel MD [facility]  If 7PM-7AM, please contact night-coverage [email or web address] Password [email or web address]  02/26/2020, 5:59 PM

## 2020-02-26 NOTE — ED Notes (Signed)
Pt transported to MRI 

## 2020-02-26 NOTE — ED Notes (Signed)
Pt given urinal and asked to produce a urine sample

## 2020-02-26 NOTE — Telephone Encounter (Signed)
Returned pt call lvm asking pt to give a call back at her earliest convenience

## 2020-02-26 NOTE — ED Notes (Signed)
hospitalist at bedside

## 2020-02-26 NOTE — Consult Note (Addendum)
Neurology Consultation Reason for Consult: generalized weakness  Referring Physician: Domenic Moras, P-AC  CC: Progressive weakness and stool incontinence  History is obtained from: The patient, his care aide Shakila and chart review  HPI: [name] is a 59 y.o. male with a past medical history significant for prior spinal cord injury, multifactorial gait instability, prior cocaine abuse complicated by STEMI, prior alcohol abuse, upper lipidemia, hypertension, diabetes complicated by retinopathy, who presents with a few days of progressive generalized weakness and numbness.  He reports that he has had longstanding weakness requiring use of a walker which he attributes to a stroke 10 years ago, though on chart review he has a known history of spinal cord stenosis which is partially accounts for his symptoms.   He and his aide reports that in the past several days he has had worsening weakness and more more difficulty with his gait.  This weakness is associated with increased neck pain, as well as blurry vision, and more recent development of bowel incontinence.  He additionally feels like he has reduced sensation throughout his body but not in his face.  He denies any recent falls.  He denies any fevers, chills, rashes, blood in his stool or blood in his urine, swelling of his joints, headaches, changes in his vision, acute changes in his hearing, and difficulty with swallowing or speech (mild baseline dysarthria mild baseline hearing loss).  ROS: A 14 point ROS was performed and is negative except as noted in the HPI.   Past Medical History:  Diagnosis Date  . Alcohol abuse    11-15-2017  per pt last alcohol Dec 2018  . BPH with obstruction/lower urinary tract symptoms   . Chronic arm pain   . Chronic pain    arms, leg, back  . CKD (chronic kidney disease), stage II   . Cocaine abuse ([place])    11-15-2017  per pt last used March 2019  . Congenital hydrocephalus, unspecified (HCC)    slow  progression  . Diabetic retinopathy of both eyes ([place])   . Diastolic CHF, chronic ([place]) 07/2017  . Gait instability    multifactorial -- slow worsening congenital hydrocephalus, peripheral neuropathy, left C5-6 cord lesion  . Hematuria   . History of acute pulmonary edema 07/2017  . History of spinal cord injury 01/02/2014   pt fell, caused spinal cord contusion at C5-6--  residual left side weakness    . 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 History of TIA (transient ischemic attack) and stroke    hx cva 12-20-2010 and  TIA 02-22-2011--- no residual's from cva or tia  . Hyperlipidemia   . Hypertension    followed by pcp  . 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 Lesion of bladder   . Peripheral edema    chronic LUE  . Peripheral neuropathy   . Type 2 diabetes mellitus ([place])    followed by dr Karle Plumber--  last A1c 6.8 on 11-06-2017  . Weak urinary stream     Family History  Problem Relation Age of Onset  . Hypertension Mother   . Diabetes Mother   . Cancer Mother        breast cancer   . Hypertension Father   . Heart disease Father   . Stroke Father   . Hypertension Sister     Social History:  reports that he quit smoking about 1 years  ago. His smoking use included cigarettes. He quit after 45.00 years of use. He has never used smokeless tobacco. He reports previous alcohol use. He reports previous drug use. Drugs: "Crack" cocaine and Cocaine.  Exam: Current vital signs: BP (!) 132/119 (BP Location: Right Arm)   Pulse 75   Temp 97.7 F (36.5 C) (Oral)   Resp 16   SpO2 100%  Vital signs in last 24 hours: Temp:  [97.7 F (36.5 C)-98.1 F (36.7 C)] 97.7 F (36.5 C) (08/05 1921) Pulse Rate:  [74-101] 75 (08/05 1921) Resp:  [16-20] 16 (08/05 1921) BP: (101-152)/(38-119) 132/119 (08/05 1921) SpO2:  [95 %-100 %] 100 % (08/05 1921)   Physical Exam   Constitutional: Appears well-developed and well-nourished.  Psych: Affect appropriate to situation, pleasant and jokes quite frequently Eyes: No scleral injection HENT: No OP obstrucion MSK: Some atrophy of the left hand Cardiovascular: Perfusing extremities well, with a regular rate Respiratory: Effort normal, non-labored breathing GI: Soft.  His aide does feel it is more distended than normal. There is no tenderness.  Skin: Some chronic skin changes most notable on the left lower extremity  Neuro: Mental Status: Patient is awake, alert, oriented to person, place, month, year, and situation. Patient is able to give a clear and coherent history, but somewhat limited in details with inconsistencies from chart review No signs of aphasia or neglect Cranial Nerves: II: Visual Fields are full. Pupils are equal, round, and reactive to light.  III,IV, VI: EOMI without ptosis or diploplia.  V: Facial sensation is symmetric to temperature VII: Facial movement is symmetric.  VIII: hearing is intact to voice X: Uvula elevates symmetrically XI: Shoulder shrug is symmetric. XII: tongue is midline without atrophy or fasciculations.  Motor: Tone is normal. Bulk is normal.  He had significant giveaway weakness throughout but it was at least 4 out of 5 throughout with slightly weaker finger extension (4-/5 bilaterally, and bilateral ankle dorsiflexion was slightly weaker (clearly a 4+) then ankle plantarflexion which was 5 out of 5 Sensory: His sensory exam was frequently inconsistent, with variable responses to what parts of his body felt colder or felt sensation were sharply.  No sensory level could be elicited.  He had diminished vibratory sensation in his bilateral great toes. Deep Tendon Reflexes: 3+ in the biceps, triceps, brachioradialis, and patella, sustained clonus in the left foot Plantars: Toes are downgoing bilaterally.  Cerebellar: Mildly ataxic finger-nose testing Gait deferred  given concern for acute on chronic spinal cord injury  I have reviewed labs in epic and the results pertinent to this consultation are: Unremarkable CBC and CMP.  Creatinine of 1.2  recent B12 of 403 on 01/2020  I have personally reviewed the images obtained: MRI cervical spine with significant compression of the C5/C6 level with some contrast enhancement in this area  Impression: Raja XZAYVION VAETH is a 59 y.o. man presenting with a subacute weakness and sensory change with examination concerning for cervical spine localization, confirmed on MRI.    Recommendations: -Initially recommended MRI C-spine and T-spine, completed -Appreciate evaluation by our neurosurgery colleagues  Case was discussed with referring practitioner  Lesleigh Noe MD-PhD [facility] [phone]  Addended for charge capture

## 2020-02-27 DIAGNOSIS — G9520 Unspecified cord compression: Secondary | ICD-10-CM | POA: Diagnosis not present

## 2020-02-27 DIAGNOSIS — I5030 Unspecified diastolic (congestive) heart failure: Secondary | ICD-10-CM | POA: Diagnosis not present

## 2020-02-27 LAB — COMPREHENSIVE METABOLIC PANEL
ALT: 14 U/L (ref 0–44)
AST: 15 U/L (ref 15–41)
Albumin: 3.3 g/dL — ABNORMAL LOW (ref 3.5–5.0)
Alkaline Phosphatase: 60 U/L (ref 38–126)
Anion gap: 11 (ref 5–15)
BUN: 31 mg/dL — ABNORMAL HIGH (ref 8–23)
CO2: 21 mmol/L — ABNORMAL LOW (ref 22–32)
Calcium: 9.2 mg/dL (ref 8.9–10.3)
Chloride: 105 mmol/L (ref 98–111)
Creatinine, Ser: 1.59 mg/dL — ABNORMAL HIGH (ref 0.61–1.24)
GFR calc Af Amer: 54 mL/min — ABNORMAL LOW (ref >60)
GFR calc non Af Amer: 46 mL/min — ABNORMAL LOW (ref >60)
Glucose, Bld: 225 mg/dL — ABNORMAL HIGH (ref 70–99)
Potassium: 5 mmol/L (ref 3.5–5.1)
Sodium: 137 mmol/L (ref 135–145)
Total Bilirubin: 0.6 mg/dL (ref 0.3–1.2)
Total Protein: 6.9 g/dL (ref 6.5–8.1)

## 2020-02-27 LAB — CBC
HCT: 41.3 % (ref 39.0–52.0)
Hemoglobin: 13.2 g/dL (ref 13.0–17.0)
MCH: 27.3 pg (ref 26.0–34.0)
MCHC: 32 g/dL (ref 30.0–36.0)
MCV: 85.5 fL (ref 80.0–100.0)
Platelets: 247 10*3/uL (ref 150–400)
RBC: 4.83 MIL/uL (ref 4.22–5.81)
RDW: 14 % (ref 11.5–15.5)
WBC: 8.4 10*3/uL (ref 4.0–10.5)
nRBC: 0 % (ref 0.0–0.2)

## 2020-02-27 LAB — GLUCOSE, CAPILLARY
Glucose-Capillary: 214 mg/dL — ABNORMAL HIGH (ref 70–99)
Glucose-Capillary: 231 mg/dL — ABNORMAL HIGH (ref 70–99)
Glucose-Capillary: 258 mg/dL — ABNORMAL HIGH (ref 70–99)
Glucose-Capillary: 260 mg/dL — ABNORMAL HIGH (ref 70–99)

## 2020-02-27 LAB — HEMOGLOBIN A1C
Hgb A1c MFr Bld: 7.1 % — ABNORMAL HIGH (ref 4.8–5.6)
Mean Plasma Glucose: 157.07 mg/dL

## 2020-02-27 NOTE — Progress Notes (Signed)
PROGRESS NOTE    [name]  ZJQ:[id number] DOB: 1960-10-28 DOA: 02/25/2020 PCP: Ladell Pier, MD    Brief Narrative:  [name] is a 59 y.o. male with medical history significant of hypertension, hyperlipidemia, diabetes mellitus, TIA, diabetic neuropathy, chronic diastolic CHF, cocaine abuse, tobacco abuse, presents to emergency department with worsening numbness and weakness in bilateral upper and lower extremities since 1 day.  He has diabetic neuropathy however since yesterday he has started feeling worsening of his numbness and tingling and weakness sensation in bilateral upper and lower extremities.  Tells me that he has difficulty lifting while picking up his utensils, unable to walk due to weakness.  Reports that his symptoms are persistent, no aggravating or relieving factors.  Denies association with recent trauma, fever, chills, headache, blurry vision, urinary, bowel incontinence.  He smokes cigarettes every day, denies alcohol use however uses cocaine now and then.  His last cocaine use was 2 days ago.  He lives alone at home and uses cane and walker for ambulation.  Upon arrival to ED: Patient's vital signs stable, afebrile, initial labs such as CBC, CMP: WNL.  MRI cervical: Thoracic spine shows moderate spinal stenosis and cord compression at C3-C4 and C4-C5.  Multilevel degenerative changes noted in thoracic spine.  EDP talk to neurosurgery.  [facility] hospitalist consulted for admission for cervical spinal stenosis with cord compression.   Assessment & Plan:   Principal Problem:   Cervical spinal cord compression (HCC) Active Problems:   Hyperlipidemia associated with type 2 diabetes mellitus (HCC)   Essential hypertension   Cocaine abuse ([place])   Type 2 diabetes mellitus (HCC)   Peripheral neuropathy   Diastolic congestive heart failure ([place])   Spinal stenosis, cervical region   Cervical stenosis with cord compression Myelopathy, quadriparesis Patient  presenting from home with progressive weakness to bilateral upper and lower extremities.  Acutely worsening over the past 1-2 days.  MR C/T-spine notable for moderate spinal stenosis with cord compression C3-4 and C4-5 with associated cord edema and multilevel degenerative changes noted in the thoracic spine. --Neurosurgery following, appreciate assistance --Continue Decadron 4 mg IV q6h --Maintain c-collar --PT/OT following --Tylenol, oxycodone, Dilaudid as needed for pain control --Neurosurgery considering surgical intervention next week per Dr. Arnoldo Morale; Dr. Ronnald Ramp to follow for further recommendations  Essential hypertension BP 125/70 this morning.  Well controlled. --Continue home hydralazine, labetalol, lisinopril --Continue aspirin and statin  Type 2 diabetes mellitus Hemoglobin A1c 7.1, well controlled. --Hold home Metformin, glipizide, Farxiga --Insulin sliding scale for coverage while inpatient --CBGs every 4 hours AC/at bedtime  Hyperlipidemia --Atorvastatin 40 mg p.o. daily  Diabetic neuropathy --Continue Cymbalta and gabapentin  Chronic diastolic congestive heart failure, compensated --Continue labetalol, lisinopril --Furosemide 40 mg p.o. daily --Continue statin --Strict I's and O's and daily weights  Polysubstance abuse with cocaine, EtOH, tobacco Review of UDS, positive cocaine on 11/08/2019, 01/23/2016, 10/03/2015.  Counseled regarding cessation with patient verbalizing understanding.   DVT prophylaxis: Lovenox Code Status: Full code Family Communication: No family present at bedside  Disposition Plan:  Status is: Inpatient  Remains inpatient appropriate because:Ongoing active pain requiring inpatient pain management, Ongoing diagnostic testing needed not appropriate for outpatient work up, Unsafe d/c plan and IV treatments appropriate due to intensity of illness or inability to take PO   Dispo: The patient is from: Home              Anticipated d/c is to: To  be determined  Anticipated d/c date is: > 3 days              Patient currently is not medically stable to d/c.   Consultants:   Neurosurgery, Dr. Arnoldo Morale  Procedures:   None  Antimicrobials:   None   Subjective: Patient seen and examined bedside, resting comfortably.  Continues with weakness bilateral upper/lower extremities associated with pain.  Seen by neurosurgery with recommendations of supportive care, IV steroids, PT/OT and consideration of possible surgical invention next week.  Patient denies headache, no visual changes, no chest pain, no palpitations, no shortness of breath, no abdominal pain.  No acute events overnight per nursing staff.  Objective: Vitals:   02/27/20 0031 02/27/20 0356 02/27/20 0733 02/27/20 1127  BP: (!) 113/59 125/70 139/81 123/67  Pulse: 68 62 72 71  Resp: 15 16 16 17   Temp: 98.3 F (36.8 C) (!) 97.5 F (36.4 C) 98.1 F (36.7 C) 97.9 F (36.6 C)  TempSrc: Oral Oral Oral Oral  SpO2: 95% 98% 98% 99%  Weight:  110 kg    Height:        Intake/Output Summary (Last 24 hours) at 02/27/2020 1349 Last data filed at 02/27/2020 1309 Gross per 24 hour  Intake 1000 ml  Output 800 ml  Net 200 ml   Filed Weights   02/26/20 1921 02/27/20 0356  Weight: 107.8 kg 110 kg    Examination:  General exam: Appears calm and comfortable  Respiratory system: Clear to auscultation. Respiratory effort normal. Cardiovascular system: S1 & S2 heard, RRR. No JVD, murmurs, rubs, gallops or clicks. No pedal edema. Gastrointestinal system: Abdomen is nondistended, soft and nontender. No organomegaly or masses felt. Normal bowel sounds heard. Central nervous system: Alert and oriented.  Muscle strength bilateral upper/lower extremities 4+/5, sensation to light touch grossly intact, no focal neurological deficits. Extremities: Symmetric 4+/5 power. Skin: No rashes, lesions or ulcers Psychiatry: Judgement and insight appear poor. Mood & affect appropriate.       Data Reviewed: I have personally reviewed following labs and imaging studies  CBC: Recent Labs  Lab 02/25/20 1709 02/26/20 1822 02/27/20 0705  WBC 10.2 9.6 8.4  NEUTROABS  --  6.0  --   HGB 14.7 13.7 13.2  HCT 46.4 43.8 41.3  MCV 85.9 86.9 85.5  PLT 290 252 812   Basic Metabolic Panel: Recent Labs  Lab 02/25/20 1709 02/26/20 1822 02/27/20 0705  NA 138 138 137  K 4.5 4.5 5.0  CL 105 103 105  CO2 25 25 21*  GLUCOSE 168* 209* 225*  BUN 13 26* 31*  CREATININE 1.20 1.46* 1.59*  CALCIUM 9.9 9.4 9.2  MG  --  2.1  --   PHOS  --  4.0  --    GFR: Estimated Creatinine Clearance: 65.4 mL/min (A) (by C-G formula based on SCr of 1.59 mg/dL (H)). Liver Function Tests: Recent Labs  Lab 02/26/20 1822 02/27/20 0705  AST 16 15  ALT 14 14  ALKPHOS 61 60  BILITOT 1.0 0.6  PROT 6.7 6.9  ALBUMIN 3.7 3.3*   No results for input(s): LIPASE, AMYLASE in the last 168 hours. No results for input(s): AMMONIA in the last 168 hours. Coagulation Profile: No results for input(s): INR, PROTIME in the last 168 hours. Cardiac Enzymes: No results for input(s): CKTOTAL, CKMB, CKMBINDEX, TROPONINI in the last 168 hours. BNP (last 3 results) No results for input(s): PROBNP in the last 8760 hours. HbA1C: Recent Labs    02/26/20 1822  HGBA1C  7.1*   CBG: Recent Labs  Lab 02/25/20 1658 02/26/20 2130 02/27/20 0557 02/27/20 1128  GLUCAP 174* 286* 214* 231*   Lipid Profile: No results for input(s): CHOL, HDL, LDLCALC, TRIG, CHOLHDL, LDLDIRECT in the last 72 hours. Thyroid Function Tests: No results for input(s): TSH, T4TOTAL, FREET4, T3FREE, THYROIDAB in the last 72 hours. Anemia Panel: No results for input(s): VITAMINB12, FOLATE, FERRITIN, TIBC, IRON, RETICCTPCT in the last 72 hours. Sepsis Labs: No results for input(s): PROCALCITON, LATICACIDVEN in the last 168 hours.  Recent Results (from the past 240 hour(s))  SARS Coronavirus 2 by RT PCR (hospital order, performed in [facility] hospital lab) Nasopharyngeal Nasopharyngeal Swab     Status: None   Collection Time: 02/26/20  6:50 PM   Specimen: Nasopharyngeal Swab  Result Value Ref Range Status   SARS Coronavirus 2 NEGATIVE NEGATIVE Final    Comment: (NOTE) SARS-CoV-2 target nucleic acids are NOT DETECTED.  The SARS-CoV-2 RNA is generally detectable in upper and lower respiratory specimens during the acute phase of infection. The lowest concentration of SARS-CoV-2 viral copies this assay can detect is 250 copies / mL. A negative result does not preclude SARS-CoV-2 infection and should not be used as the sole basis for treatment or other patient management decisions.  A negative result may occur with improper specimen collection / handling, submission of specimen other than nasopharyngeal swab, presence of viral mutation(s) within the areas targeted by this assay, and inadequate number of viral copies (<250 copies / mL). A negative result must be combined with clinical observations, patient history, and epidemiological information.  Fact Sheet for Patients:   StrictlyIdeas.no  Fact Sheet for Healthcare Providers: BankingDealers.co.za  This test is not yet approved or  cleared by the [place] FDA and has been authorized for detection and/or diagnosis of SARS-CoV-2 by FDA under an Emergency Use Authorization (EUA).  This EUA will remain in effect (meaning this test can be used) for the duration of the COVID-19 declaration under Section 564(b)(1) of the Act, 21 U.S.C. section 360bbb-3(b)(1), unless the authorization is terminated or revoked sooner.  Performed at [facility], [place] [address] [place], [place] [address]          Radiology Studies: MR Cervical Spine W or Wo Contrast  Result Date: 02/26/2020 CLINICAL DATA:  Bilateral arm and leg weakness. EXAM: MRI CERVICAL AND THORACIC SPINE WITHOUT AND WITH CONTRAST TECHNIQUE: Multiplanar and  multiecho pulse sequences of the cervical spine, to include the craniocervical junction and cervicothoracic junction, and thoracic spine, were obtained without and with intravenous contrast. CONTRAST:  58mL GADAVIST GADOBUTROL 1 MMOL/ML IV SOLN COMPARISON:  MRI cervical spine with contrast 04/27/2015. CT cervical spine 05/09/2019. CT cervical spine 12/26/2013 FINDINGS: MRI CERVICAL SPINE FINDINGS Alignment: Normal alignment Vertebrae: Negative for fracture or mass. Discogenic changes in the bone marrow at C5-6, C6-7, C7-T1. Mild bone marrow edema at C5-6 which appears degenerative. Findings are in not suggestive of discitis. Cord: Cord hyperintensity bilaterally at C5-6, left greater than right. This was present in 2016. There is cord compression at C3-4 and C4-5 due to circumferential narrowing of the canal. See below description. No other areas of cord signal abnormality. Posterior Fossa, vertebral arteries, paraspinal tissues: There is posterior ligamentous thickening and enhancement involving the ligamentum flavum at C3 and C4 left greater than right. In addition, there is low signal tissue within the ligamentum flavum on the left at C3 and C4 which does not enhance. This consistent with ligament calcification and  was noted on the prior CT in 2020 but was not present in 2015 Disc levels: C2-3: Mild disc degeneration. Negative for spinal or foraminal stenosis C3-4: Disc degeneration with central and right-sided disc protrusion and associated spurring. Bilateral ligament thickening left greater than right which shows enhancement. There is moderate spinal stenosis with compression of the cord. There is mild to moderate foraminal stenosis bilaterally. C4-5: Disc degeneration with shallow central disc protrusion and diffuse uncinate spurring. Mild facet degeneration bilaterally. Moderate to severe spinal stenosis with compression of the cord left greater than right. Severe left foraminal encroachment and moderate  right foraminal encroachment. Diffuse ligament thickening and enhancement. Calcification in the left ligamentum flavum is noted on prior CT. C5-6: Disc degeneration with diffuse uncinate facet degeneration. Spurring. Mild posterior ligament thickening and enhancement. Mild spinal stenosis. Cord hyperintensity bilaterally left greater than right. Moderate foraminal stenosis bilaterally C6-7: Disc degeneration with diffuse endplate spurring. Moderate foraminal encroachment bilaterally. C7-T1: Disc degeneration with edema in the endplates which show mild enhancement. There is diffuse endplate spurring and moderate foraminal stenosis bilaterally. MRI THORACIC SPINE FINDINGS Segmentation:  Normal Alignment:  Normal Vertebrae:  Negative for fracture or mass Spinal cord: Normal signal and morphology. No cord compression. Mild vascular enhancement dorsal to the lower thoracic cord is most likely normal vessels. Paraspinal and other soft tissues: Negative for paraspinous mass or adenopathy. No paraspinous fluid collection. No pleural effusion. Disc levels: T1-2: Disc degeneration and spurring with foraminal narrowing bilaterally. T2-3: Mild spurring on the left without significant stenosis T3-4: Disc degeneration and spurring on the right with mild right foraminal narrowing T4-5: Mild disc degeneration. Right-sided facet degeneration and mild right foraminal narrowing. T7-8: Mild disc degeneration and small to moderate left foraminal disc protrusion. T8-9: Disc and facet degeneration. Mild foraminal narrowing bilaterally T9-10: Disc degeneration and spurring. Bilateral facet hypertrophy. Right foraminal narrowing due to spurring. T10-11: Disc degeneration and facet degeneration. Foraminal narrowing bilaterally due to spurring. T11-12: Disc and facet degeneration. Mild foraminal narrowing bilaterally due to spurring. IMPRESSION: 1. Moderate spinal stenosis and cord compression at C3-4 and C4-5. This is due to a combination of  disc degeneration and spurring as well as prominent ligamentum flavum hypertrophy bilaterally left greater right. The ligament show edema and enhancement C3 through C5. In addition, there is posterior ligament calcification at C3-4 and C4-5 contributing to stenosis. Infection is a consideration, white blood count slightly elevated. However more likely is a inflammatory process in the ligaments causing edema and enlargement. Chronic calcification ligament is noted on prior CT. 2. 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 This is relatively mild and unchanged from 2016 MRI. 3. Multilevel thoracic degenerative changes causing foraminal stenosis bilaterally. No cord compression. 4. These results were called by telephone at the time of interpretation on 02/26/2020 at 4:03 pm to provider [facility] , who verbally acknowledged these results. Electronically Signed   By: Franchot Gallo M.D.   On: 02/26/2020 16:04   MR THORACIC SPINE W WO CONTRAST  Result Date: 02/26/2020 CLINICAL DATA:  Bilateral arm and leg weakness. EXAM: MRI CERVICAL AND THORACIC SPINE WITHOUT AND WITH CONTRAST TECHNIQUE: Multiplanar and multiecho pulse sequences of the cervical spine, to include the craniocervical junction and cervicothoracic junction, and thoracic spine, were obtained without and with intravenous contrast. CONTRAST:  7mL GADAVIST GADOBUTROL 1 MMOL/ML IV SOLN COMPARISON:  MRI cervical spine with contrast 04/27/2015. CT cervical spine 05/09/2019. CT cervical spine 12/26/2013 FINDINGS: MRI CERVICAL SPINE FINDINGS Alignment: Normal alignment Vertebrae:  Negative for fracture or mass. Discogenic changes in the bone marrow at C5-6, C6-7, C7-T1. Mild bone marrow edema at C5-6 which appears degenerative. Findings are in not suggestive of discitis. Cord: Cord hyperintensity bilaterally at C5-6, left greater than right. This was present in 2016. There is cord compression at C3-4 and C4-5 due to circumferential  narrowing of the canal. See below description. No other areas of cord signal abnormality. Posterior Fossa, vertebral arteries, paraspinal tissues: There is posterior ligamentous thickening and enhancement involving the ligamentum flavum at C3 and C4 left greater than right. In addition, there is low signal tissue within the ligamentum flavum on the left at C3 and C4 which does not enhance. This consistent with ligament calcification and was noted on the prior CT in 2020 but was not present in 2015 Disc levels: C2-3: Mild disc degeneration. Negative for spinal or foraminal stenosis C3-4: Disc degeneration with central and right-sided disc protrusion and associated spurring. Bilateral ligament thickening left greater than right which shows enhancement. There is moderate spinal stenosis with compression of the cord. There is mild to moderate foraminal stenosis bilaterally. C4-5: Disc degeneration with shallow central disc protrusion and diffuse uncinate spurring. Mild facet degeneration bilaterally. Moderate to severe spinal stenosis with compression of the cord left greater than right. Severe left foraminal encroachment and moderate right foraminal encroachment. Diffuse ligament thickening and enhancement. Calcification in the left ligamentum flavum is noted on prior CT. C5-6: Disc degeneration with diffuse uncinate facet degeneration. Spurring. Mild posterior ligament thickening and enhancement. Mild spinal stenosis. Cord hyperintensity bilaterally left greater than right. Moderate foraminal stenosis bilaterally C6-7: Disc degeneration with diffuse endplate spurring. Moderate foraminal encroachment bilaterally. C7-T1: Disc degeneration with edema in the endplates which show mild enhancement. There is diffuse endplate spurring and moderate foraminal stenosis bilaterally. MRI THORACIC SPINE FINDINGS Segmentation:  Normal Alignment:  Normal Vertebrae:  Negative for fracture or mass Spinal cord: Normal signal and  morphology. No cord compression. Mild vascular enhancement dorsal to the lower thoracic cord is most likely normal vessels. Paraspinal and other soft tissues: Negative for paraspinous mass or adenopathy. No paraspinous fluid collection. No pleural effusion. Disc levels: T1-2: Disc degeneration and spurring with foraminal narrowing bilaterally. T2-3: Mild spurring on the left without significant stenosis T3-4: Disc degeneration and spurring on the right with mild right foraminal narrowing T4-5: Mild disc degeneration. Right-sided facet degeneration and mild right foraminal narrowing. T7-8: Mild disc degeneration and small to moderate left foraminal disc protrusion. T8-9: Disc and facet degeneration. Mild foraminal narrowing bilaterally T9-10: Disc degeneration and spurring. Bilateral facet hypertrophy. Right foraminal narrowing due to spurring. T10-11: Disc degeneration and facet degeneration. Foraminal narrowing bilaterally due to spurring. T11-12: Disc and facet degeneration. Mild foraminal narrowing bilaterally due to spurring. IMPRESSION: 1. Moderate spinal stenosis and cord compression at C3-4 and C4-5. This is due to a combination of disc degeneration and spurring as well as prominent ligamentum flavum hypertrophy bilaterally left greater right. The ligament show edema and enhancement C3 through C5. In addition, there is posterior ligament calcification at C3-4 and C4-5 contributing to stenosis. Infection is a consideration, white blood count slightly elevated. However more likely is a inflammatory process in the ligaments causing edema and enlargement. Chronic calcification ligament is noted on prior CT. 2. 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 This is relatively mild and unchanged from 2016 MRI. 3. Multilevel thoracic degenerative changes causing foraminal stenosis bilaterally. No cord compression. 4. These results were called by telephone at the  time of interpretation on  02/26/2020 at 4:03 pm to provider [facility] , who verbally acknowledged these results. Electronically Signed   By: Franchot Gallo M.D.   On: 02/26/2020 16:04        Scheduled Meds: . aspirin EC  81 mg Oral Daily  . atorvastatin  40 mg Oral Daily  . dexamethasone (DECADRON) injection  4 mg Intravenous Q6H  . DULoxetine  60 mg Oral Daily  . enoxaparin (LOVENOX) injection  40 mg Subcutaneous Q24H  . furosemide  40 mg Oral Daily  . gabapentin  300 mg Oral BID  . hydrALAZINE  10 mg Oral BID  . insulin aspart  0-15 Units Subcutaneous TID WC  . insulin aspart  0-5 Units Subcutaneous QHS  . labetalol  100 mg Oral BID  . lisinopril  40 mg Oral Daily   Continuous Infusions: . sodium chloride 125 mL/hr at 02/27/20 0641     LOS: 1 day    Time spent: 37 minutes spent on chart review, discussion with nursing staff, consultants, updating family and interview/physical exam; more than 50% of that time was spent in counseling and/or coordination of care.    Mackayla Mullins J [place] DO [facility] Available via Epic secure chat 7am-7pm After these hours, please refer to coverage provider listed on [email or web address] 02/27/2020, 1:49 PM

## 2020-02-27 NOTE — Progress Notes (Signed)
Subjective: The patient is alert and pleasant.  He is in no apparent distress.  Objective: Vital signs in last 24 hours: Temp:  [97.5 F (36.4 C)-98.3 F (36.8 C)] 97.5 F (36.4 C) (08/06 0356) Pulse Rate:  [62-92] 62 (08/06 0356) Resp:  [15-18] 16 (08/06 0356) BP: (101-152)/(38-119) 125/70 (08/06 0356) SpO2:  [95 %-100 %] 98 % (08/06 0356) Weight:  [107.8 kg-110 kg] 110 kg (08/06 0356) Estimated body mass index is 30.31 kg/m as calculated from the following:   Height as of this encounter: 6\' 3"  (1.905 m).   Weight as of this encounter: 110 kg.   Intake/Output from previous day: 08/05 0701 - 08/06 0700 In: 240 [P.O.:240] Out: -  Intake/Output this shift: Total I/O In: 240 [P.O.:240] Out: -   Physical exam the patient is alert and oriented and moving all 4 extremities well.  He is wearing his cervical collar.  Lab Results: Recent Labs    02/25/20 1709 02/26/20 1822  WBC 10.2 9.6  HGB 14.7 13.7  HCT 46.4 43.8  PLT 290 252   BMET Recent Labs    02/25/20 1709 02/26/20 1822  NA 138 138  K 4.5 4.5  CL 105 103  CO2 25 25  GLUCOSE 168* 209*  BUN 13 26*  CREATININE 1.20 1.46*  CALCIUM 9.9 9.4    Studies/Results: MR Cervical Spine W or Wo Contrast  Result Date: 02/26/2020 CLINICAL DATA:  Bilateral arm and leg weakness. EXAM: MRI CERVICAL AND THORACIC SPINE WITHOUT AND WITH CONTRAST TECHNIQUE: Multiplanar and multiecho pulse sequences of the cervical spine, to include the craniocervical junction and cervicothoracic junction, and thoracic spine, were obtained without and with intravenous contrast. CONTRAST:  19mL GADAVIST GADOBUTROL 1 MMOL/ML IV SOLN COMPARISON:  MRI cervical spine with contrast 04/27/2015. CT cervical spine 05/09/2019. CT cervical spine 12/26/2013 FINDINGS: MRI CERVICAL SPINE FINDINGS Alignment: Normal alignment Vertebrae: Negative for fracture or mass. Discogenic changes in the bone marrow at C5-6, C6-7, C7-T1. Mild bone marrow edema at C5-6 which  appears degenerative. Findings are in not suggestive of discitis. Cord: Cord hyperintensity bilaterally at C5-6, left greater than right. This was present in 2016. There is cord compression at C3-4 and C4-5 due to circumferential narrowing of the canal. See below description. No other areas of cord signal abnormality. Posterior Fossa, vertebral arteries, paraspinal tissues: There is posterior ligamentous thickening and enhancement involving the ligamentum flavum at C3 and C4 left greater than right. In addition, there is low signal tissue within the ligamentum flavum on the left at C3 and C4 which does not enhance. This consistent with ligament calcification and was noted on the prior CT in 2020 but was not present in 2015 Disc levels: C2-3: Mild disc degeneration. Negative for spinal or foraminal stenosis C3-4: Disc degeneration with central and right-sided disc protrusion and associated spurring. Bilateral ligament thickening left greater than right which shows enhancement. There is moderate spinal stenosis with compression of the cord. There is mild to moderate foraminal stenosis bilaterally. C4-5: Disc degeneration with shallow central disc protrusion and diffuse uncinate spurring. Mild facet degeneration bilaterally. Moderate to severe spinal stenosis with compression of the cord left greater than right. Severe left foraminal encroachment and moderate right foraminal encroachment. Diffuse ligament thickening and enhancement. Calcification in the left ligamentum flavum is noted on prior CT. C5-6: Disc degeneration with diffuse uncinate facet degeneration. Spurring. Mild posterior ligament thickening and enhancement. Mild spinal stenosis. Cord hyperintensity bilaterally left greater than right. Moderate foraminal stenosis bilaterally C6-7: Disc degeneration  with diffuse endplate spurring. Moderate foraminal encroachment bilaterally. C7-T1: Disc degeneration with edema in the endplates which show mild enhancement.  There is diffuse endplate spurring and moderate foraminal stenosis bilaterally. MRI THORACIC SPINE FINDINGS Segmentation:  Normal Alignment:  Normal Vertebrae:  Negative for fracture or mass Spinal cord: Normal signal and morphology. No cord compression. Mild vascular enhancement dorsal to the lower thoracic cord is most likely normal vessels. Paraspinal and other soft tissues: Negative for paraspinous mass or adenopathy. No paraspinous fluid collection. No pleural effusion. Disc levels: T1-2: Disc degeneration and spurring with foraminal narrowing bilaterally. T2-3: Mild spurring on the left without significant stenosis T3-4: Disc degeneration and spurring on the right with mild right foraminal narrowing T4-5: Mild disc degeneration. Right-sided facet degeneration and mild right foraminal narrowing. T7-8: Mild disc degeneration and small to moderate left foraminal disc protrusion. T8-9: Disc and facet degeneration. Mild foraminal narrowing bilaterally T9-10: Disc degeneration and spurring. Bilateral facet hypertrophy. Right foraminal narrowing due to spurring. T10-11: Disc degeneration and facet degeneration. Foraminal narrowing bilaterally due to spurring. T11-12: Disc and facet degeneration. Mild foraminal narrowing bilaterally due to spurring. IMPRESSION: 1. Moderate spinal stenosis and cord compression at C3-4 and C4-5. This is due to a combination of disc degeneration and spurring as well as prominent ligamentum flavum hypertrophy bilaterally left greater right. The ligament show edema and enhancement C3 through C5. In addition, there is posterior ligament calcification at C3-4 and C4-5 contributing to stenosis. Infection is a consideration, white blood count slightly elevated. However more likely is a inflammatory process in the ligaments causing edema and enlargement. Chronic calcification ligament is noted on prior CT. 2. 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 This is  relatively mild and unchanged from 2016 MRI. 3. Multilevel thoracic degenerative changes causing foraminal stenosis bilaterally. No cord compression. 4. These results were called by telephone at the time of interpretation on 02/26/2020 at 4:03 pm to provider [facility] , who verbally acknowledged these results. Electronically Signed   By: Franchot Gallo M.D.   On: 02/26/2020 16:04   MR THORACIC SPINE W WO CONTRAST  Result Date: 02/26/2020 CLINICAL DATA:  Bilateral arm and leg weakness. EXAM: MRI CERVICAL AND THORACIC SPINE WITHOUT AND WITH CONTRAST TECHNIQUE: Multiplanar and multiecho pulse sequences of the cervical spine, to include the craniocervical junction and cervicothoracic junction, and thoracic spine, were obtained without and with intravenous contrast. CONTRAST:  66mL GADAVIST GADOBUTROL 1 MMOL/ML IV SOLN COMPARISON:  MRI cervical spine with contrast 04/27/2015. CT cervical spine 05/09/2019. CT cervical spine 12/26/2013 FINDINGS: MRI CERVICAL SPINE FINDINGS Alignment: Normal alignment Vertebrae: Negative for fracture or mass. Discogenic changes in the bone marrow at C5-6, C6-7, C7-T1. Mild bone marrow edema at C5-6 which appears degenerative. Findings are in not suggestive of discitis. Cord: Cord hyperintensity bilaterally at C5-6, left greater than right. This was present in 2016. There is cord compression at C3-4 and C4-5 due to circumferential narrowing of the canal. See below description. No other areas of cord signal abnormality. Posterior Fossa, vertebral arteries, paraspinal tissues: There is posterior ligamentous thickening and enhancement involving the ligamentum flavum at C3 and C4 left greater than right. In addition, there is low signal tissue within the ligamentum flavum on the left at C3 and C4 which does not enhance. This consistent with ligament calcification and was noted on the prior CT in 2020 but was not present in 2015 Disc levels: C2-3: Mild disc degeneration. Negative for spinal or  foraminal stenosis C3-4: Disc  degeneration with central and right-sided disc protrusion and associated spurring. Bilateral ligament thickening left greater than right which shows enhancement. There is moderate spinal stenosis with compression of the cord. There is mild to moderate foraminal stenosis bilaterally. C4-5: Disc degeneration with shallow central disc protrusion and diffuse uncinate spurring. Mild facet degeneration bilaterally. Moderate to severe spinal stenosis with compression of the cord left greater than right. Severe left foraminal encroachment and moderate right foraminal encroachment. Diffuse ligament thickening and enhancement. Calcification in the left ligamentum flavum is noted on prior CT. C5-6: Disc degeneration with diffuse uncinate facet degeneration. Spurring. Mild posterior ligament thickening and enhancement. Mild spinal stenosis. Cord hyperintensity bilaterally left greater than right. Moderate foraminal stenosis bilaterally C6-7: Disc degeneration with diffuse endplate spurring. Moderate foraminal encroachment bilaterally. C7-T1: Disc degeneration with edema in the endplates which show mild enhancement. There is diffuse endplate spurring and moderate foraminal stenosis bilaterally. MRI THORACIC SPINE FINDINGS Segmentation:  Normal Alignment:  Normal Vertebrae:  Negative for fracture or mass Spinal cord: Normal signal and morphology. No cord compression. Mild vascular enhancement dorsal to the lower thoracic cord is most likely normal vessels. Paraspinal and other soft tissues: Negative for paraspinous mass or adenopathy. No paraspinous fluid collection. No pleural effusion. Disc levels: T1-2: Disc degeneration and spurring with foraminal narrowing bilaterally. T2-3: Mild spurring on the left without significant stenosis T3-4: Disc degeneration and spurring on the right with mild right foraminal narrowing T4-5: Mild disc degeneration. Right-sided facet degeneration and mild right  foraminal narrowing. T7-8: Mild disc degeneration and small to moderate left foraminal disc protrusion. T8-9: Disc and facet degeneration. Mild foraminal narrowing bilaterally T9-10: Disc degeneration and spurring. Bilateral facet hypertrophy. Right foraminal narrowing due to spurring. T10-11: Disc degeneration and facet degeneration. Foraminal narrowing bilaterally due to spurring. T11-12: Disc and facet degeneration. Mild foraminal narrowing bilaterally due to spurring. IMPRESSION: 1. Moderate spinal stenosis and cord compression at C3-4 and C4-5. This is due to a combination of disc degeneration and spurring as well as prominent ligamentum flavum hypertrophy bilaterally left greater right. The ligament show edema and enhancement C3 through C5. In addition, there is posterior ligament calcification at C3-4 and C4-5 contributing to stenosis. Infection is a consideration, white blood count slightly elevated. However more likely is a inflammatory process in the ligaments causing edema and enlargement. Chronic calcification ligament is noted on prior CT. 2. 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 This is relatively mild and unchanged from 2016 MRI. 3. Multilevel thoracic degenerative changes causing foraminal stenosis bilaterally. No cord compression. 4. These results were called by telephone at the time of interpretation on 02/26/2020 at 4:03 pm to provider [facility] , who verbally acknowledged these results. Electronically Signed   By: Franchot Gallo M.D.   On: 02/26/2020 16:04    Assessment/Plan: Cervical stenosis, myelopathy, Quadraparesis: I have again discussed the situation with the patient.  He would likely benefit from surgery.  I would recommend continued PT OT mobilization of the patient and IV steroids for a few days.  Dr. Ronnald Ramp will follow up with him next week to formulate a definitive plan for the timing of surgery.  LOS: 1 day     Ophelia Charter 02/27/2020, 6:53  AM

## 2020-02-27 NOTE — Progress Notes (Addendum)
Patient is awake, alert, oriented.  He states that his symptoms are stable.  At this point, his MRI confirmed cervical myelopathy and neurosurgery has been consulted.  Neurology will be available on an as-needed basis moving forward, please call with further questions or concerns.  Roland Rack, MD [facility] [phone]  If 7pm- 7am, please page neurology on call as listed in [place].

## 2020-02-27 NOTE — Telephone Encounter (Signed)
Patient called, left VM to return the call to the office. 

## 2020-02-27 NOTE — Progress Notes (Signed)
Occupational Therapy Evaluation   Patient with functional deficits listed below impacting safety and independence with self care. At baseline patient has caregivers 7 days a week from 03-1114 to assist "with whatever I need" including cooking, making his bed, dressing/bathing as needed. Patient reports prior to hospitalization uses urinal "when I have to go the other, sometimes I make it sometimes I don't" to the bathroom using rollator. Currently patient is min A x2 for transfer to recliner for safety due to buckling. Patient very pleasant and agreeable to rehab.     02/27/20 1300  OT Visit Information  Last OT Received On 02/27/20  Assistance Needed +2 (safety (buckling))  PT/OT/SLP Co-Evaluation/Treatment Yes  Reason for Co-Treatment For patient/therapist safety;To address functional/ADL transfers  OT goals addressed during session ADL's and self-care  History of Present Illness Pt is 59 yo male who presented to ED with worsening numbness and weakness in bil UE and LE (reports chronic but worsened over past 3-4 weeks).  Pt's MRI revealed severe spinal stenosis C3-4, C4-5 with cord compression.  Neurosurgery has been consulted and recommended continuing PT/OT, IV steroids a few days, possible sx next week, and continuing to wear cervical collar.  Precautions  Precautions Other (comment)  Precaution Comments cervical  Required Braces or Orthoses Cervical Brace  Cervical Brace Hard collar;At all times  Restrictions  Weight Bearing Restrictions No  Home Living  Family/patient expects to be discharged to: Private residence  Living Arrangements Alone  Available Help at Discharge Family;Personal care attendant  Type of [place] Access Level entry  [place] One level  Bathroom Shower/Tub Tub/shower unit  [place] - single point;Walker - 2 wheels;BSC  Prior Function  Level of Independence Needs assistance  Gait / Transfers Assistance  Needed Uses Rollator for amublation ; walks slowly  ADL's / Homemaking Assistance Needed reports aides assist with IADLs, showers, and dressing; reports does tolieting but sometimes not able to get to bathroom in time  Comments Has aide 7 days a week for about 2 hours in am; Pt reports weakness/numbness in extremities (L worse than right) chronically but has worsened over the past few weeks  Communication  Communication No difficulties  Pain Assessment  Pain Assessment Faces  Faces Pain Scale 2  Pain Location hands/feet tingling  Pain Descriptors / Indicators Tingling  Pain Intervention(s) Monitored during session  Cognition  Arousal/Alertness Awake/alert  Behavior During Therapy WFL for tasks assessed/performed  Overall Cognitive Status Within Functional Limits for tasks assessed  Upper Extremity Assessment  Upper Extremity Assessment RUE deficits/detail;LUE deficits/detail  RUE Deficits / Details AROM WFL, grossly 4-/5  RUE Sensation  (reports numbness however intact light touch)  LUE Deficits / Details limited shoulder flexion ~75 degrees (pt reports not new, hx spine injury ~27yrs ago) MMT grossly 3-/5  LUE Sensation  (p reports numbness greater L than R; light touch intact)  LUE Coordination decreased fine motor;decreased gross motor  Lower Extremity Assessment  Lower Extremity Assessment Defer to PT evaluation  Cervical / Trunk Assessment  Cervical / Trunk Assessment Other exceptions  Cervical / Trunk Exceptions cervical collar in place - forward head  ADL  Overall ADL's  Needs assistance/impaired  Eating/Feeding Set up;Sitting  Grooming Set up;Sitting  Upper Body Bathing Sitting;Minimal assistance  Lower Body Bathing Maximal assistance;Sitting/lateral leans;Sit to/from stand  Upper Body Dressing  Minimal assistance;Sitting  Lower Body Dressing Maximal assistance;Sit to/from stand;Sitting/lateral leans  Toilet Transfer Minimal assistance;+2 for  safety/equipment;BSC;Stand-pivot;Cueing for safety  Toilet Transfer Details (indicate cue type and reason) to recliner, +2 for safety due to LE buckling, decreased eccentric control  Toileting- Clothing Manipulation and Hygiene Maximal assistance;Sit to/from stand  Functional mobility during ADLs Minimal assistance;+2 for safety/equipment;Rolling walker;Cueing for safety  Bed Mobility  Overal bed mobility Needs Assistance  Bed Mobility Supine to Sit  Supine to sit Min assist  General bed mobility comments use of bed rail and assist to lift trunk  Transfers  Overall transfer level Needs assistance  Equipment used Rolling walker (2 wheeled)  Transfers Sit to/from Stand  Sit to Stand Min assist;+2 safety/equipment  General transfer comment bed height elevated, patient required multiple attempts with fits sit to stand, improved mobility with practice, patient complete x5 sit to stand during session   Balance  Overall balance assessment Needs assistance  Sitting-balance support No upper extremity supported  Sitting balance-Leahy Scale Good  Standing balance support Bilateral upper extremity supported  Standing balance-Leahy Scale Poor  Standing balance comment required RW  OT - End of Session  Equipment Utilized During Treatment Rolling walker;Gait belt;Cervical collar  Activity Tolerance Patient tolerated treatment well  Patient left in chair;with call bell/phone within reach;with chair alarm set  Nurse Communication Mobility status  OT Assessment  OT Recommendation/Assessment Patient needs continued OT Services  OT Visit Diagnosis Unsteadiness on feet (R26.81);Other abnormalities of gait and mobility (R26.89)  OT Problem List Decreased activity tolerance;Impaired balance (sitting and/or standing);Decreased strength;Decreased safety awareness  OT Plan  OT Frequency (ACUTE ONLY) Min 2X/week  OT Treatment/Interventions (ACUTE ONLY) Self-care/ADL training;Therapeutic exercise;DME and/or AE  instruction;Therapeutic activities;Patient/family education;Balance training  AM-PAC OT "6 Clicks" Daily Activity Outcome Measure (Version 2)  Help from another person eating meals? 3  Help from another person taking care of personal grooming? 3  Help from another person toileting, which includes using toliet, bedpan, or urinal? 2  Help from another person bathing (including washing, rinsing, drying)? 2  Help from another person to put on and taking off regular upper body clothing? 3  Help from another person to put on and taking off regular lower body clothing? 2  6 Click Score 15  OT Recommendation  Follow Up Recommendations CIR  OT Equipment None recommended by OT  Individuals Consulted  Consulted and Agree with Results and Recommendations Patient  Acute Rehab OT Goals  Patient Stated Goal regain strength  OT Goal Formulation With patient  Time For Goal Achievement 03/12/20  Potential to Achieve Goals Good  OT Time Calculation  OT Start Time (ACUTE ONLY) 1036  OT Stop Time (ACUTE ONLY) 1107  OT Time Calculation (min) 31 min  OT General Charges  $OT Visit 1 Visit  OT Evaluation  $OT Eval Moderate Complexity 1 Mod  Written Expression  Dominant Hand Right   Delbert Phenix OT OT pager: [phone]

## 2020-02-27 NOTE — Progress Notes (Signed)
Inpatient Rehab Admissions Coordinator Note:   Per therapy recommendations, pt was screened for CIR candidacy by Shann Medal, PT, DPT.  At this time note plans for possible surgery next week.  Will follow from a distance for post-surgical therapy recommendations.  Please contact me with questions.   Shann Medal, PT, DPT [phone] 02/27/20 11:36 AM

## 2020-02-27 NOTE — Evaluation (Addendum)
Physical Therapy Evaluation Patient Details Name: [name] MRN: [id number] DOB: Oct 26, 1960 Today's Date: 02/27/2020   History of Present Illness  Pt is 59 yo male who presented to ED with worsening numbness and weakness in bil UE and LE (reports chronic but worsened over past 3-4 weeks).  Pt's MRI revealed severe spinal stenosis C3-4, C4-5 with cord compression.  Neurosurgery has been consulted and recommended continuing PT/OT, IV steroids a few days, possible sx next week, and continuing to wear cervical collar.  Clinical Impression  Pt admitted with above diagnosis. Pt presenting with weakness in all extremities L worse than R.  Additionally with decreased sensation throughout extremities, decreased balance, safety, ROM, and mobility.  Pt has had chronic spine issues and weakness but reports worsening over the past 3 weeks.  He has been living alone with assist of personal aides for a few hours in the morning.  Pt will need ongoing therapy assessment after surgery.  Pt currently with functional limitations due to the deficits listed below (see PT Problem List). Pt will benefit from skilled PT to increase their independence and safety with mobility to allow discharge to the venue listed below.       Follow Up Recommendations CIR    Equipment Recommendations  None recommended by PT (has DME)    Recommendations for Other Services Rehab consult     Precautions / Restrictions Precautions Precautions: Other (comment) Precaution Comments: cervical Required Braces or Orthoses: Cervical Brace Cervical Brace: Hard collar;At all times Restrictions Weight Bearing Restrictions: No      Mobility  Bed Mobility Overal bed mobility: Needs Assistance Bed Mobility: Supine to Sit     Supine to sit: Min assist     General bed mobility comments: use of bed rail and assist to lift trunk  Transfers Overall transfer level: Needs assistance Equipment used: Rolling walker (2  wheeled) Transfers: Sit to/from Stand Sit to Stand: Min assist;+2 safety/equipment         General transfer comment: Performed sit to stand x 5 during session.  Required bed elevated, use of momentum, and multiple attempts on first stand.  Had assist of 2 for safety  Ambulation/Gait Ambulation/Gait assistance: Min assist;+2 safety/equipment Gait Distance (Feet): 3 Feet (x4) Assistive device: Rolling walker (2 wheeled) Gait Pattern/deviations: Step-to pattern;Decreased stride length;Shuffle;Ataxic;Narrow base of support Gait velocity: decreased   General Gait Details: Pt took steps at EOB and to chair; assist for RW and steadying; fatigued easily with knee buckling but able to maintain standing with only min A  Stairs            Wheelchair Mobility    Modified Rankin (Stroke Patients Only)       Balance Overall balance assessment: Needs assistance Sitting-balance support: No upper extremity supported Sitting balance-Leahy Scale: Good     Standing balance support: Bilateral upper extremity supported Standing balance-Leahy Scale: Poor Standing balance comment: required RW                             Pertinent Vitals/Pain Pain Assessment: Faces Faces Pain Scale: Hurts a little bit Pain Location: hands/feet tingling Pain Descriptors / Indicators: Tingling Pain Intervention(s): Limited activity within patient's tolerance;Monitored during session    Home Living Family/patient expects to be discharged to:: Private residence Living Arrangements: Alone Available Help at Discharge: Family;Personal care attendant Type of Home: Apartment Home Access: Level entry     Home Layout: One level Home Equipment: Kasandra Knudsen -  single point;Walker - 2 wheels;Bedside commode      Prior Function Level of Independence: Needs assistance   Gait / Transfers Assistance Needed: Uses Rollator for amublation ; walks slowly  ADL's / Homemaking Assistance Needed: reports aides  assist with IADLs, showers, and dressing; reports does tolieting but sometimes not able to get to bathroom in time  Comments: Has aide 7 days a week for about 2 hours in am; Pt reports weakness/numbness in extremities (L worse than right) chronically but has worsened over the past few weeks     Hand Dominance   Dominant Hand: Right    Extremity/Trunk Assessment   Upper Extremity Assessment Upper Extremity Assessment: Defer to OT evaluation (Decreased strength in bil UE (L worse than R), defer to OT for further assessment)    Lower Extremity Assessment Lower Extremity Assessment: LLE deficits/detail;RLE deficits/detail RLE Deficits / Details: ROM WFL; MMT 4+/5 RLE Sensation: decreased light touch (can feel but diminished and with tingling throughtout LE) LLE Deficits / Details: ROM WFL; MMT hip 2/5, knee 3/5 with difficulty, ankle 4/5;  clonus per neuro note (did not test with PT) LLE Sensation: decreased light touch (can feel but diminished and with tingling throughtout LE)    Cervical / Trunk Assessment Cervical / Trunk Assessment: Other exceptions Cervical / Trunk Exceptions: cervical collar in place - forward head  Communication   Communication: No difficulties  Cognition Arousal/Alertness: Awake/alert Behavior During Therapy: WFL for tasks assessed/performed Overall Cognitive Status: Within Functional Limits for tasks assessed                                        General Comments      Exercises     Assessment/Plan    PT Assessment Patient needs continued PT services  PT Problem List Decreased strength;Decreased mobility;Decreased range of motion;Decreased coordination;Decreased knowledge of precautions;Decreased activity tolerance;Decreased balance;Decreased knowledge of use of DME;Impaired sensation       PT Treatment Interventions DME instruction;Therapeutic activities;Gait training;Therapeutic exercise;Patient/family education;Stair  training;Balance training;Functional mobility training;Neuromuscular re-education    PT Goals (Current goals can be found in the Care Plan section)  Acute Rehab PT Goals Patient Stated Goal: regain strength PT Goal Formulation: With patient Time For Goal Achievement: 03/12/20 Potential to Achieve Goals: Good    Frequency Min 3X/week   Barriers to discharge Decreased caregiver support      Co-evaluation PT/OT/SLP Co-Evaluation/Treatment: Yes Reason for Co-Treatment: Complexity of the patient's impairments (multi-system involvement);For patient/therapist safety PT goals addressed during session: Mobility/safety with mobility;Strengthening/ROM;Balance OT goals addressed during session: ADL's and self-care;Strengthening/ROM       AM-PAC PT "6 Clicks" Mobility  Outcome Measure Help needed turning from your back to your side while in a flat bed without using bedrails?: A Little Help needed moving from lying on your back to sitting on the side of a flat bed without using bedrails?: A Little Help needed moving to and from a bed to a chair (including a wheelchair)?: A Little Help needed standing up from a chair using your arms (e.g., wheelchair or bedside chair)?: A Little Help needed to walk in hospital room?: A Lot Help needed climbing 3-5 steps with a railing? : Total 6 Click Score: 15    End of Session Equipment Utilized During Treatment: Gait belt;Cervical collar Activity Tolerance: Patient tolerated treatment well Patient left: with chair alarm set;in chair;with call bell/phone within reach Nurse Communication: Mobility  status;Other (comment) (recommended assist of 2 for safety (verbal and white board communication)) PT Visit Diagnosis: Unsteadiness on feet (R26.81);Muscle weakness (generalized) (M62.81)    Time: 5198-2429 PT Time Calculation (min) (ACUTE ONLY): 26 min   Charges:   PT Evaluation $PT Eval Moderate Complexity: 1 Melina Schools, PT Acute Rehab  Services Pager [phone] [facility] [phone]    Karlton Lemon 02/27/2020, 11:24 AM

## 2020-02-28 DIAGNOSIS — I5030 Unspecified diastolic (congestive) heart failure: Secondary | ICD-10-CM | POA: Diagnosis not present

## 2020-02-28 LAB — BASIC METABOLIC PANEL
Anion gap: 11 (ref 5–15)
BUN: 30 mg/dL — ABNORMAL HIGH (ref 8–23)
CO2: 19 mmol/L — ABNORMAL LOW (ref 22–32)
Calcium: 9.2 mg/dL (ref 8.9–10.3)
Chloride: 104 mmol/L (ref 98–111)
Creatinine, Ser: 1.12 mg/dL (ref 0.61–1.24)
GFR calc Af Amer: >60 mL/min (ref >60)
GFR calc non Af Amer: >60 mL/min (ref >60)
Glucose, Bld: 238 mg/dL — ABNORMAL HIGH (ref 70–99)
Potassium: 4.2 mmol/L (ref 3.5–5.1)
Sodium: 134 mmol/L — ABNORMAL LOW (ref 135–145)

## 2020-02-28 LAB — GLUCOSE, CAPILLARY
Glucose-Capillary: 254 mg/dL — ABNORMAL HIGH (ref 70–99)
Glucose-Capillary: 322 mg/dL — ABNORMAL HIGH (ref 70–99)
Glucose-Capillary: 232 mg/dL — ABNORMAL HIGH (ref 70–99)
Glucose-Capillary: 316 mg/dL — ABNORMAL HIGH (ref 70–99)

## 2020-02-28 LAB — CBC
HCT: 41.3 % (ref 39.0–52.0)
Hemoglobin: 13.5 g/dL (ref 13.0–17.0)
MCH: 27.8 pg (ref 26.0–34.0)
MCHC: 32.7 g/dL (ref 30.0–36.0)
MCV: 85.2 fL (ref 80.0–100.0)
Platelets: 269 10*3/uL (ref 150–400)
RBC: 4.85 MIL/uL (ref 4.22–5.81)
RDW: 13.8 % (ref 11.5–15.5)
WBC: 17.7 10*3/uL — ABNORMAL HIGH (ref 4.0–10.5)
nRBC: 0 % (ref 0.0–0.2)

## 2020-02-28 LAB — MAGNESIUM: Magnesium: 2.1 mg/dL (ref 1.7–2.4)

## 2020-02-28 MED ORDER — ENOXAPARIN SODIUM 40 MG/0.4ML ~~LOC~~ SOLN
40.0000 mg | Freq: Every day | SUBCUTANEOUS | Status: DC
  Administered 2020-02-29 – 2020-03-03 (×4): 40 mg via SUBCUTANEOUS
  Filled 2020-02-28 (×4): qty 0.4

## 2020-02-28 MED ORDER — INSULIN GLARGINE 100 UNIT/ML ~~LOC~~ SOLN
10.0000 [IU] | Freq: Every day | SUBCUTANEOUS | Status: DC
  Administered 2020-02-28: 10 [IU] via SUBCUTANEOUS
  Filled 2020-02-28 (×2): qty 0.1

## 2020-02-28 NOTE — Progress Notes (Signed)
Neurosurgery  S: Patient c/o imbalance, weakness.  Symptoms unchanged over past 5 days.  BP (!) 158/69 (BP Location: Right Arm)   Pulse 80   Temp 98.3 F (36.8 C) (Oral)   Resp 20   Ht 6\' 3"  (1.905 m)   Wt 109.5 kg   SpO2 100%   BMI 30.16 kg/m  Awake, alert NAD C-collar in place 4+/5 biceps, triceps, hand grip bilaterally, 5/5 strength in LEs.  Clonus on left  A/p: Cervical stenosis with myelopathy - patient likely would benefit from surgery.  Plan is for Dr. Ronnald Ramp to see patient Monday for possible surgery.  - cont lovenox - please hold aspirin for possible surgery

## 2020-02-28 NOTE — Progress Notes (Addendum)
PROGRESS NOTE    [name]  SPQ:[id number] DOB: July 21, 1961 DOA: 02/25/2020 PCP: Ladell Pier, MD    Brief Narrative:  [name] is a 59 y.o. male with medical history significant of hypertension, hyperlipidemia, diabetes mellitus, TIA, diabetic neuropathy, chronic diastolic CHF, cocaine abuse, tobacco abuse, presents to emergency department with worsening numbness and weakness in bilateral upper and lower extremities since 1 day.  He has diabetic neuropathy however since yesterday he has started feeling worsening of his numbness and tingling and weakness sensation in bilateral upper and lower extremities.  Tells me that he has difficulty lifting while picking up his utensils, unable to walk due to weakness.  Reports that his symptoms are persistent, no aggravating or relieving factors.  Denies association with recent trauma, fever, chills, headache, blurry vision, urinary, bowel incontinence.  He smokes cigarettes every day, denies alcohol use however uses cocaine now and then.  His last cocaine use was 2 days ago.  He lives alone at home and uses cane and walker for ambulation.  Upon arrival to ED: Patient's vital signs stable, afebrile, initial labs such as CBC, CMP: WNL.  MRI cervical: Thoracic spine shows moderate spinal stenosis and cord compression at C3-C4 and C4-C5.  Multilevel degenerative changes noted in thoracic spine.  EDP talk to neurosurgery.  [facility] hospitalist consulted for admission for cervical spinal stenosis with cord compression.   Assessment & Plan:   Principal Problem:   Cervical spinal cord compression (HCC) Active Problems:   Hyperlipidemia associated with type 2 diabetes mellitus (HCC)   Essential hypertension   Cocaine abuse ([place])   Type 2 diabetes mellitus (HCC)   Peripheral neuropathy   Diastolic congestive heart failure ([place])   Spinal stenosis, cervical region   Cervical stenosis with cord compression Myelopathy, quadriparesis Patient  presenting from home with progressive weakness to bilateral upper and lower extremities.  Acutely worsening over the past 1-2 days.  MR C/T-spine notable for moderate spinal stenosis with cord compression C3-4 and C4-5 with associated cord edema and multilevel degenerative changes noted in the thoracic spine. --Neurosurgery following, appreciate assistance --Continue Decadron 4 mg IV q6h --Maintain c-collar --PT/OT following --Tylenol, oxycodone, Dilaudid as needed for pain control --Neurosurgery considering surgical intervention next week per Dr. Arnoldo Morale and Dr. Marcello Moores; Dr. Ronnald Ramp to follow for further recommendations on Monday; holding aspirin  Essential hypertension BP 125/70 this morning.  Well controlled. --Continue home hydralazine, labetalol, lisinopril --Continue statin --Holding aspirin as above for possible surgical invention early next week.  Type 2 diabetes mellitus Hemoglobin A1c 7.1, well controlled. --Hold home Metformin, glipizide, Farxiga --Start Lantus 10 units subcutaneously daily --Moderate dose insulin sliding scale for coverage while inpatient --CBGs every 4 hours AC/at bedtime  Hyperlipidemia --Atorvastatin 40 mg p.o. daily  Diabetic neuropathy --Continue Cymbalta and gabapentin  Chronic diastolic congestive heart failure, compensated --Continue labetalol, lisinopril --Furosemide 40 mg p.o. daily --Continue statin --Strict I's and O's and daily weights  Polysubstance abuse with cocaine, EtOH, tobacco Review of UDS, positive cocaine on 11/08/2019, 01/23/2016, 10/03/2015.  Counseled regarding cessation with patient verbalizing understanding.   DVT prophylaxis: Lovenox Code Status: Full code Family Communication: No family present at bedside  Disposition Plan:  Status is: Inpatient  Remains inpatient appropriate because:Ongoing active pain requiring inpatient pain management, Ongoing diagnostic testing needed not appropriate for outpatient work up, Unsafe  d/c plan and IV treatments appropriate due to intensity of illness or inability to take PO   Dispo: The patient is from: Home  Anticipated d/c is to: To be determined              Anticipated d/c date is: > 3 days              Patient currently is not medically stable to d/c.   Consultants:   Neurosurgery, Dr. Arnoldo Morale  Procedures:   None  Antimicrobials:   None   Subjective: Patient seen and examined bedside, resting comfortably in bedside chair watching television.  Continues with weakness bilateral upper/lower extremities associated with pain.  Request adjustment of his cervical collar this morning.  Seen by neurosurgery with recommendations of supportive care, IV steroids, PT/OT and consideration of possible surgical invention next week after evaluation by Dr. Ronnald Ramp.  Patient denies headache, no visual changes, no chest pain, no palpitations, no shortness of breath, no abdominal pain.  No acute events overnight per nursing staff.  Objective: Vitals:   02/27/20 2026 02/27/20 2217 02/28/20 0016 02/28/20 0822  BP: (!) 170/95 (!) 176/84 (!) 164/85 (!) 158/69  Pulse: 87 65 71 80  Resp: 17 20 15 20   Temp: 98.2 F (36.8 C) 97.6 F (36.4 C) 98.1 F (36.7 C) 98.3 F (36.8 C)  TempSrc: Oral Oral Oral Oral  SpO2: 98% 98% 97% 100%  Weight:   109.5 kg   Height:        Intake/Output Summary (Last 24 hours) at 02/28/2020 1339 Last data filed at 02/28/2020 1100 Gross per 24 hour  Intake 460 ml  Output 1576 ml  Net -1116 ml   Filed Weights   02/26/20 1921 02/27/20 0356 02/28/20 0016  Weight: 107.8 kg 110 kg 109.5 kg    Examination:  General exam: Appears calm and comfortable  Respiratory system: Clear to auscultation. Respiratory effort normal. Cardiovascular system: S1 & S2 heard, RRR. No JVD, murmurs, rubs, gallops or clicks. No pedal edema. Gastrointestinal system: Abdomen is nondistended, soft and nontender. No organomegaly or masses felt. Normal bowel sounds  heard. Central nervous system: Alert and oriented.  Muscle strength bilateral upper/lower extremities 4+/5, sensation to light touch grossly intact, + clonus left, no focal neurological deficits. Extremities: Symmetric 4+/5 power. Skin: No rashes, lesions or ulcers Psychiatry: Judgement and insight appear poor. Mood & affect appropriate.     Data Reviewed: I have personally reviewed following labs and imaging studies  CBC: Recent Labs  Lab 02/25/20 1709 02/26/20 1822 02/27/20 0705 02/28/20 0624  WBC 10.2 9.6 8.4 17.7*  NEUTROABS  --  6.0  --   --   HGB 14.7 13.7 13.2 13.5  HCT 46.4 43.8 41.3 41.3  MCV 85.9 86.9 85.5 85.2  PLT 290 252 247 833   Basic Metabolic Panel: Recent Labs  Lab 02/25/20 1709 02/26/20 1822 02/27/20 0705 02/28/20 0624  NA 138 138 137 134*  K 4.5 4.5 5.0 4.2  CL 105 103 105 104  CO2 25 25 21* 19*  GLUCOSE 168* 209* 225* 238*  BUN 13 26* 31* 30*  CREATININE 1.20 1.46* 1.59* 1.12  CALCIUM 9.9 9.4 9.2 9.2  MG  --  2.1  --  2.1  PHOS  --  4.0  --   --    GFR: Estimated Creatinine Clearance: 92.6 mL/min (by C-G formula based on SCr of 1.12 mg/dL). Liver Function Tests: Recent Labs  Lab 02/26/20 1822 02/27/20 0705  AST 16 15  ALT 14 14  ALKPHOS 61 60  BILITOT 1.0 0.6  PROT 6.7 6.9  ALBUMIN 3.7 3.3*   No results for input(s):  LIPASE, AMYLASE in the last 168 hours. No results for input(s): AMMONIA in the last 168 hours. Coagulation Profile: No results for input(s): INR, PROTIME in the last 168 hours. Cardiac Enzymes: No results for input(s): CKTOTAL, CKMB, CKMBINDEX, TROPONINI in the last 168 hours. BNP (last 3 results) No results for input(s): PROBNP in the last 8760 hours. HbA1C: Recent Labs    02/26/20 1822  HGBA1C 7.1*   CBG: Recent Labs  Lab 02/27/20 1128 02/27/20 1619 02/27/20 2201 02/28/20 0609 02/28/20 1112  GLUCAP 231* 258* 260* 254* 322*   Lipid Profile: No results for input(s): CHOL, HDL, LDLCALC, TRIG, CHOLHDL,  LDLDIRECT in the last 72 hours. Thyroid Function Tests: No results for input(s): TSH, T4TOTAL, FREET4, T3FREE, THYROIDAB in the last 72 hours. Anemia Panel: No results for input(s): VITAMINB12, FOLATE, FERRITIN, TIBC, IRON, RETICCTPCT in the last 72 hours. Sepsis Labs: No results for input(s): PROCALCITON, LATICACIDVEN in the last 168 hours.  Recent Results (from the past 240 hour(s))  SARS Coronavirus 2 by RT PCR (hospital order, performed in [facility] hospital lab) Nasopharyngeal Nasopharyngeal Swab     Status: None   Collection Time: 02/26/20  6:50 PM   Specimen: Nasopharyngeal Swab  Result Value Ref Range Status   SARS Coronavirus 2 NEGATIVE NEGATIVE Final    Comment: (NOTE) SARS-CoV-2 target nucleic acids are NOT DETECTED.  The SARS-CoV-2 RNA is generally detectable in upper and lower respiratory specimens during the acute phase of infection. The lowest concentration of SARS-CoV-2 viral copies this assay can detect is 250 copies / mL. A negative result does not preclude SARS-CoV-2 infection and should not be used as the sole basis for treatment or other patient management decisions.  A negative result may occur with improper specimen collection / handling, submission of specimen other than nasopharyngeal swab, presence of viral mutation(s) within the areas targeted by this assay, and inadequate number of viral copies (<250 copies / mL). A negative result must be combined with clinical observations, patient history, and epidemiological information.  Fact Sheet for Patients:   StrictlyIdeas.no  Fact Sheet for Healthcare Providers: BankingDealers.co.za  This test is not yet approved or  cleared by the [place] FDA and has been authorized for detection and/or diagnosis of SARS-CoV-2 by FDA under an Emergency Use Authorization (EUA).  This EUA will remain in effect (meaning this test can be used) for the duration of  the COVID-19 declaration under Section 564(b)(1) of the Act, 21 U.S.C. section 360bbb-3(b)(1), unless the authorization is terminated or revoked sooner.  Performed at [facility], [place] [address] [place], [place] [address]          Radiology Studies: MR Cervical Spine W or Wo Contrast  Result Date: 02/26/2020 CLINICAL DATA:  Bilateral arm and leg weakness. EXAM: MRI CERVICAL AND THORACIC SPINE WITHOUT AND WITH CONTRAST TECHNIQUE: Multiplanar and multiecho pulse sequences of the cervical spine, to include the craniocervical junction and cervicothoracic junction, and thoracic spine, were obtained without and with intravenous contrast. CONTRAST:  53mL GADAVIST GADOBUTROL 1 MMOL/ML IV SOLN COMPARISON:  MRI cervical spine with contrast 04/27/2015. CT cervical spine 05/09/2019. CT cervical spine 12/26/2013 FINDINGS: MRI CERVICAL SPINE FINDINGS Alignment: Normal alignment Vertebrae: Negative for fracture or mass. Discogenic changes in the bone marrow at C5-6, C6-7, C7-T1. Mild bone marrow edema at C5-6 which appears degenerative. Findings are in not suggestive of discitis. Cord: Cord hyperintensity bilaterally at C5-6, left greater than right. This was present in 2016. There is cord compression at C3-4  and C4-5 due to circumferential narrowing of the canal. See below description. No other areas of cord signal abnormality. Posterior Fossa, vertebral arteries, paraspinal tissues: There is posterior ligamentous thickening and enhancement involving the ligamentum flavum at C3 and C4 left greater than right. In addition, there is low signal tissue within the ligamentum flavum on the left at C3 and C4 which does not enhance. This consistent with ligament calcification and was noted on the prior CT in 2020 but was not present in 2015 Disc levels: C2-3: Mild disc degeneration. Negative for spinal or foraminal stenosis C3-4: Disc degeneration with central and right-sided disc protrusion and associated  spurring. Bilateral ligament thickening left greater than right which shows enhancement. There is moderate spinal stenosis with compression of the cord. There is mild to moderate foraminal stenosis bilaterally. C4-5: Disc degeneration with shallow central disc protrusion and diffuse uncinate spurring. Mild facet degeneration bilaterally. Moderate to severe spinal stenosis with compression of the cord left greater than right. Severe left foraminal encroachment and moderate right foraminal encroachment. Diffuse ligament thickening and enhancement. Calcification in the left ligamentum flavum is noted on prior CT. C5-6: Disc degeneration with diffuse uncinate facet degeneration. Spurring. Mild posterior ligament thickening and enhancement. Mild spinal stenosis. Cord hyperintensity bilaterally left greater than right. Moderate foraminal stenosis bilaterally C6-7: Disc degeneration with diffuse endplate spurring. Moderate foraminal encroachment bilaterally. C7-T1: Disc degeneration with edema in the endplates which show mild enhancement. There is diffuse endplate spurring and moderate foraminal stenosis bilaterally. MRI THORACIC SPINE FINDINGS Segmentation:  Normal Alignment:  Normal Vertebrae:  Negative for fracture or mass Spinal cord: Normal signal and morphology. No cord compression. Mild vascular enhancement dorsal to the lower thoracic cord is most likely normal vessels. Paraspinal and other soft tissues: Negative for paraspinous mass or adenopathy. No paraspinous fluid collection. No pleural effusion. Disc levels: T1-2: Disc degeneration and spurring with foraminal narrowing bilaterally. T2-3: Mild spurring on the left without significant stenosis T3-4: Disc degeneration and spurring on the right with mild right foraminal narrowing T4-5: Mild disc degeneration. Right-sided facet degeneration and mild right foraminal narrowing. T7-8: Mild disc degeneration and small to moderate left foraminal disc protrusion. T8-9:  Disc and facet degeneration. Mild foraminal narrowing bilaterally T9-10: Disc degeneration and spurring. Bilateral facet hypertrophy. Right foraminal narrowing due to spurring. T10-11: Disc degeneration and facet degeneration. Foraminal narrowing bilaterally due to spurring. T11-12: Disc and facet degeneration. Mild foraminal narrowing bilaterally due to spurring. IMPRESSION: 1. Moderate spinal stenosis and cord compression at C3-4 and C4-5. This is due to a combination of disc degeneration and spurring as well as prominent ligamentum flavum hypertrophy bilaterally left greater right. The ligament show edema and enhancement C3 through C5. In addition, there is posterior ligament calcification at C3-4 and C4-5 contributing to stenosis. Infection is a consideration, white blood count slightly elevated. However more likely is a inflammatory process in the ligaments causing edema and enlargement. Chronic calcification ligament is noted on prior CT. 2. 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 This is relatively mild and unchanged from 2016 MRI. 3. Multilevel thoracic degenerative changes causing foraminal stenosis bilaterally. No cord compression. 4. These results were called by telephone at the time of interpretation on 02/26/2020 at 4:03 pm to provider [facility] , who verbally acknowledged these results. Electronically Signed   By: Franchot Gallo M.D.   On: 02/26/2020 16:04   MR THORACIC SPINE W WO CONTRAST  Result Date: 02/26/2020 CLINICAL DATA:  Bilateral arm and leg weakness.  EXAM: MRI CERVICAL AND THORACIC SPINE WITHOUT AND WITH CONTRAST TECHNIQUE: Multiplanar and multiecho pulse sequences of the cervical spine, to include the craniocervical junction and cervicothoracic junction, and thoracic spine, were obtained without and with intravenous contrast. CONTRAST:  45mL GADAVIST GADOBUTROL 1 MMOL/ML IV SOLN COMPARISON:  MRI cervical spine with contrast 04/27/2015. CT cervical spine  05/09/2019. CT cervical spine 12/26/2013 FINDINGS: MRI CERVICAL SPINE FINDINGS Alignment: Normal alignment Vertebrae: Negative for fracture or mass. Discogenic changes in the bone marrow at C5-6, C6-7, C7-T1. Mild bone marrow edema at C5-6 which appears degenerative. Findings are in not suggestive of discitis. Cord: Cord hyperintensity bilaterally at C5-6, left greater than right. This was present in 2016. There is cord compression at C3-4 and C4-5 due to circumferential narrowing of the canal. See below description. No other areas of cord signal abnormality. Posterior Fossa, vertebral arteries, paraspinal tissues: There is posterior ligamentous thickening and enhancement involving the ligamentum flavum at C3 and C4 left greater than right. In addition, there is low signal tissue within the ligamentum flavum on the left at C3 and C4 which does not enhance. This consistent with ligament calcification and was noted on the prior CT in 2020 but was not present in 2015 Disc levels: C2-3: Mild disc degeneration. Negative for spinal or foraminal stenosis C3-4: Disc degeneration with central and right-sided disc protrusion and associated spurring. Bilateral ligament thickening left greater than right which shows enhancement. There is moderate spinal stenosis with compression of the cord. There is mild to moderate foraminal stenosis bilaterally. C4-5: Disc degeneration with shallow central disc protrusion and diffuse uncinate spurring. Mild facet degeneration bilaterally. Moderate to severe spinal stenosis with compression of the cord left greater than right. Severe left foraminal encroachment and moderate right foraminal encroachment. Diffuse ligament thickening and enhancement. Calcification in the left ligamentum flavum is noted on prior CT. C5-6: Disc degeneration with diffuse uncinate facet degeneration. Spurring. Mild posterior ligament thickening and enhancement. Mild spinal stenosis. Cord hyperintensity bilaterally  left greater than right. Moderate foraminal stenosis bilaterally C6-7: Disc degeneration with diffuse endplate spurring. Moderate foraminal encroachment bilaterally. C7-T1: Disc degeneration with edema in the endplates which show mild enhancement. There is diffuse endplate spurring and moderate foraminal stenosis bilaterally. MRI THORACIC SPINE FINDINGS Segmentation:  Normal Alignment:  Normal Vertebrae:  Negative for fracture or mass Spinal cord: Normal signal and morphology. No cord compression. Mild vascular enhancement dorsal to the lower thoracic cord is most likely normal vessels. Paraspinal and other soft tissues: Negative for paraspinous mass or adenopathy. No paraspinous fluid collection. No pleural effusion. Disc levels: T1-2: Disc degeneration and spurring with foraminal narrowing bilaterally. T2-3: Mild spurring on the left without significant stenosis T3-4: Disc degeneration and spurring on the right with mild right foraminal narrowing T4-5: Mild disc degeneration. Right-sided facet degeneration and mild right foraminal narrowing. T7-8: Mild disc degeneration and small to moderate left foraminal disc protrusion. T8-9: Disc and facet degeneration. Mild foraminal narrowing bilaterally T9-10: Disc degeneration and spurring. Bilateral facet hypertrophy. Right foraminal narrowing due to spurring. T10-11: Disc degeneration and facet degeneration. Foraminal narrowing bilaterally due to spurring. T11-12: Disc and facet degeneration. Mild foraminal narrowing bilaterally due to spurring. IMPRESSION: 1. Moderate spinal stenosis and cord compression at C3-4 and C4-5. This is due to a combination of disc degeneration and spurring as well as prominent ligamentum flavum hypertrophy bilaterally left greater right. The ligament show edema and enhancement C3 through C5. In addition, there is posterior ligament calcification at C3-4 and C4-5 contributing to stenosis.  Infection is a consideration, white blood count  slightly elevated. However more likely is a inflammatory process in the ligaments causing edema and enlargement. Chronic calcification ligament is noted on prior CT. 2. 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 This is relatively mild and unchanged from 2016 MRI. 3. Multilevel thoracic degenerative changes causing foraminal stenosis bilaterally. No cord compression. 4. These results were called by telephone at the time of interpretation on 02/26/2020 at 4:03 pm to provider [facility] , who verbally acknowledged these results. Electronically Signed   By: Franchot Gallo M.D.   On: 02/26/2020 16:04        Scheduled Meds: . aspirin EC  81 mg Oral Daily  . atorvastatin  40 mg Oral Daily  . dexamethasone (DECADRON) injection  4 mg Intravenous Q6H  . DULoxetine  60 mg Oral Daily  . enoxaparin (LOVENOX) injection  40 mg Subcutaneous Q24H  . furosemide  40 mg Oral Daily  . gabapentin  300 mg Oral BID  . hydrALAZINE  10 mg Oral BID  . insulin aspart  0-15 Units Subcutaneous TID WC  . insulin aspart  0-5 Units Subcutaneous QHS  . insulin glargine  10 Units Subcutaneous Daily  . labetalol  100 mg Oral BID  . lisinopril  40 mg Oral Daily   Continuous Infusions:    LOS: 2 days    Time spent: 37 minutes spent on chart review, discussion with nursing staff, consultants, updating family and interview/physical exam; more than 50% of that time was spent in counseling and/or coordination of care.    Chandlar Staebell J [place] DO [facility] Available via Epic secure chat 7am-7pm After these hours, please refer to coverage provider listed on [email or web address] 02/28/2020, 1:39 PM

## 2020-02-29 DIAGNOSIS — I5030 Unspecified diastolic (congestive) heart failure: Secondary | ICD-10-CM | POA: Diagnosis not present

## 2020-02-29 LAB — GLUCOSE, CAPILLARY
Glucose-Capillary: 287 mg/dL — ABNORMAL HIGH (ref 70–99)
Glucose-Capillary: 341 mg/dL — ABNORMAL HIGH (ref 70–99)
Glucose-Capillary: 335 mg/dL — ABNORMAL HIGH (ref 70–99)
Glucose-Capillary: 230 mg/dL — ABNORMAL HIGH (ref 70–99)

## 2020-02-29 MED ORDER — INSULIN GLARGINE 100 UNIT/ML ~~LOC~~ SOLN
25.0000 [IU] | Freq: Every day | SUBCUTANEOUS | Status: DC
  Administered 2020-02-29: 25 [IU] via SUBCUTANEOUS
  Filled 2020-02-29 (×2): qty 0.25

## 2020-02-29 NOTE — Progress Notes (Signed)
   Providing Compassionate, Quality Care - Together  NEUROSURGERY PROGRESS NOTE   S: No issues overnight.   O: EXAM:  BP (!) 164/85 (BP Location: Right Arm)   Pulse 60   Temp 98.4 F (36.9 C) (Oral)   Resp 20   Ht 6\' 3"  (1.905 m)   Wt 109.4 kg   SpO2 100%   BMI 30.15 kg/m   Awake, alert, oriented  Speech fluent, appropriate  Face symmetric 4+/5 BUE 5/5 BLE  ASSESSMENT:  59 y.o. male with  1.  Cervical stenosis with myelopathy  PLAN: -Discussed with Dr. Ronnald Ramp tomorrow for surgical planning -DVT prophylaxis -Hold all anticoagulants otherwise for surgical planning   Thank you for allowing me to participate in this patient's care.  Please do not hesitate to call with questions or concerns.   Elwin Sleight, [facility] Cell: [phone]

## 2020-02-29 NOTE — Progress Notes (Signed)
PROGRESS NOTE    [name]  NIO:[id number] DOB: [birth year] DOA: 02/25/2020 PCP: Ladell Pier, MD    Brief Narrative:  [name] is a 59 y.o. male with medical history significant of hypertension, hyperlipidemia, diabetes mellitus, TIA, diabetic neuropathy, chronic diastolic CHF, cocaine abuse, tobacco abuse, presents to emergency department with worsening numbness and weakness in bilateral upper and lower extremities since 1 day.  He has diabetic neuropathy however since yesterday he has started feeling worsening of his numbness and tingling and weakness sensation in bilateral upper and lower extremities.  Tells me that he has difficulty lifting while picking up his utensils, unable to walk due to weakness.  Reports that his symptoms are persistent, no aggravating or relieving factors.  Denies association with recent trauma, fever, chills, headache, blurry vision, urinary, bowel incontinence.  He smokes cigarettes every day, denies alcohol use however uses cocaine now and then.  His last cocaine use was 2 days ago.  He lives alone at home and uses cane and walker for ambulation.  Upon arrival to ED: Patient's vital signs stable, afebrile, initial labs such as CBC, CMP: WNL.  MRI cervical: Thoracic spine shows moderate spinal stenosis and cord compression at C3-C4 and C4-C5.  Multilevel degenerative changes noted in thoracic spine.  EDP talk to neurosurgery.  [facility] consulted for admission for cervical spinal stenosis with cord compression.   Assessment & Plan:   Principal Problem:   Cervical spinal cord compression (HCC) Active Problems:   Hyperlipidemia associated with type 2 diabetes mellitus (HCC)   Essential hypertension   Cocaine abuse ([place])   Type 2 diabetes mellitus (HCC)   Peripheral neuropathy   Diastolic congestive heart failure ([place])   Spinal stenosis, cervical region   Cervical stenosis with cord compression Myelopathy, quadriparesis Patient  presenting from home with progressive weakness to bilateral upper and lower extremities.  Acutely worsening over the past 1-2 days.  MR C/T-spine notable for moderate spinal stenosis with cord compression C3-4 and C4-5 with associated cord edema and multilevel degenerative changes noted in the thoracic spine. --Neurosurgery following, appreciate assistance --Continue Decadron 4 mg IV q6h --Maintain c-collar --PT/OT following --Tylenol, oxycodone, Dilaudid as needed for pain control --Neurosurgery considering surgical intervention next week per Dr. Arnoldo Morale, Dr. Marcello Moores, and Dr. Reatha Armour; Dr. Ronnald Ramp to follow for further recommendations on Monday; holding aspirin  Essential hypertension --Continue home hydralazine, labetalol, lisinopril --Continue statin --Holding aspirin as above for possible surgical invention early next week.  Type 2 diabetes mellitus Hemoglobin A1c 7.1, well controlled. --Hold home Metformin, glipizide, Farxiga --Increase Lantus to 25 units subcutaneously daily --Moderate dose insulin sliding scale for coverage while inpatient --CBGs every 4 hours AC/at bedtime  Hyperlipidemia --Atorvastatin 40 mg p.o. daily  Diabetic neuropathy --Continue Cymbalta and gabapentin  Chronic diastolic congestive heart failure, compensated --Continue labetalol, lisinopril --Furosemide 40 mg p.o. daily --Continue statin --Strict I's and O's and daily weights  Polysubstance abuse with cocaine, EtOH, tobacco Review of UDS, positive cocaine on 11/08/2019, 01/23/2016, 10/03/2015.  Counseled regarding cessation with patient verbalizing understanding.   DVT prophylaxis: Lovenox Code Status: Full code Family Communication: No family present at bedside  Disposition Plan:  Status is: Inpatient  Remains inpatient appropriate because:Ongoing active pain requiring inpatient pain management, Ongoing diagnostic testing needed not appropriate for outpatient work up, Unsafe d/c plan and IV treatments  appropriate due to intensity of illness or inability to take PO   Dispo: The patient is from: Home  Anticipated d/c is to: To be determined              Anticipated d/c date is: > 3 days              Patient currently is not medically stable to d/c.   Consultants:   Neurosurgery, Dr. Arnoldo Morale  Procedures:   None  Antimicrobials:   None   Subjective: Patient seen and examined bedside, resting comfortably sitting at edge of bed, just finished breakfast.  Complains about irritation from c-collar.  Continues with numbness to bilateral hands with associated mild weakness.  Neurosurgery continues to follow with with recommendations of supportive care, IV steroids, PT/OT and consideration of possible surgical invention next week after evaluation by Dr. Ronnald Ramp.  Patient denies headache, no visual changes, no chest pain, no palpitations, no shortness of breath, no abdominal pain.  No acute events overnight per nursing staff.  Objective: Vitals:   02/29/20 0052 02/29/20 0424 02/29/20 0500 02/29/20 0731  BP:  (!) 162/85  (!) 164/85  Pulse:  67  60  Resp:  18  20  Temp:  97.9 F (36.6 C)  98.4 F (36.9 C)  TempSrc:  Oral  Oral  SpO2:  100%  100%  Weight: 109.4 kg  109.4 kg   Height:        Intake/Output Summary (Last 24 hours) at 02/29/2020 1129 Last data filed at 02/29/2020 0700 Gross per 24 hour  Intake 120 ml  Output 450 ml  Net -330 ml   Filed Weights   02/28/20 0016 02/29/20 0052 02/29/20 0500  Weight: 109.5 kg 109.4 kg 109.4 kg    Examination:  General exam: Appears calm and comfortable  Respiratory system: Clear to auscultation. Respiratory effort normal. Cardiovascular system: S1 & S2 heard, RRR. No JVD, murmurs, rubs, gallops or clicks. No pedal edema. Gastrointestinal system: Abdomen is nondistended, soft and nontender. No organomegaly or masses felt. Normal bowel sounds heard. Central nervous system: Alert and oriented.  Muscle strength bilateral  upper/lower extremities 4+/5, sensation to light touch grossly intact, no focal neurological deficits. Extremities: Symmetric 4+/5 power bilat UE, 5/5 BLE. Skin: No rashes, lesions or ulcers Psychiatry: Judgement and insight appear poor. Mood & affect appropriate.     Data Reviewed: I have personally reviewed following labs and imaging studies  CBC: Recent Labs  Lab 02/25/20 1709 02/26/20 1822 02/27/20 0705 02/28/20 0624  WBC 10.2 9.6 8.4 17.7*  NEUTROABS  --  6.0  --   --   HGB 14.7 13.7 13.2 13.5  HCT 46.4 43.8 41.3 41.3  MCV 85.9 86.9 85.5 85.2  PLT 290 252 247 595   Basic Metabolic Panel: Recent Labs  Lab 02/25/20 1709 02/26/20 1822 02/27/20 0705 02/28/20 0624  NA 138 138 137 134*  K 4.5 4.5 5.0 4.2  CL 105 103 105 104  CO2 25 25 21* 19*  GLUCOSE 168* 209* 225* 238*  BUN 13 26* 31* 30*  CREATININE 1.20 1.46* 1.59* 1.12  CALCIUM 9.9 9.4 9.2 9.2  MG  --  2.1  --  2.1  PHOS  --  4.0  --   --    GFR: Estimated Creatinine Clearance: 92.6 mL/min (by C-G formula based on SCr of 1.12 mg/dL). Liver Function Tests: Recent Labs  Lab 02/26/20 1822 02/27/20 0705  AST 16 15  ALT 14 14  ALKPHOS 61 60  BILITOT 1.0 0.6  PROT 6.7 6.9  ALBUMIN 3.7 3.3*   No results for input(s): LIPASE, AMYLASE in  the last 168 hours. No results for input(s): AMMONIA in the last 168 hours. Coagulation Profile: No results for input(s): INR, PROTIME in the last 168 hours. Cardiac Enzymes: No results for input(s): CKTOTAL, CKMB, CKMBINDEX, TROPONINI in the last 168 hours. BNP (last 3 results) No results for input(s): PROBNP in the last 8760 hours. HbA1C: Recent Labs    02/26/20 1822  HGBA1C 7.1*   CBG: Recent Labs  Lab 02/28/20 0609 02/28/20 1112 02/28/20 1704 02/28/20 2131 02/29/20 0627  GLUCAP 254* 322* 232* 316* 287*   Lipid Profile: No results for input(s): CHOL, HDL, LDLCALC, TRIG, CHOLHDL, LDLDIRECT in the last 72 hours. Thyroid Function Tests: No results for  input(s): TSH, T4TOTAL, FREET4, T3FREE, THYROIDAB in the last 72 hours. Anemia Panel: No results for input(s): VITAMINB12, FOLATE, FERRITIN, TIBC, IRON, RETICCTPCT in the last 72 hours. Sepsis Labs: No results for input(s): PROCALCITON, LATICACIDVEN in the last 168 hours.  Recent Results (from the past 240 hour(s))  SARS Coronavirus 2 by RT PCR (hospital order, performed in [facility] hospital lab) Nasopharyngeal Nasopharyngeal Swab     Status: None   Collection Time: 02/26/20  6:50 PM   Specimen: Nasopharyngeal Swab  Result Value Ref Range Status   SARS Coronavirus 2 NEGATIVE NEGATIVE Final    Comment: (NOTE) SARS-CoV-2 target nucleic acids are NOT DETECTED.  The SARS-CoV-2 RNA is generally detectable in upper and lower respiratory specimens during the acute phase of infection. The lowest concentration of SARS-CoV-2 viral copies this assay can detect is 250 copies / mL. A negative result does not preclude SARS-CoV-2 infection and should not be used as the sole basis for treatment or other patient management decisions.  A negative result may occur with improper specimen collection / handling, submission of specimen other than nasopharyngeal swab, presence of viral mutation(s) within the areas targeted by this assay, and inadequate number of viral copies (<250 copies / mL). A negative result must be combined with clinical observations, patient history, and epidemiological information.  Fact Sheet for Patients:   StrictlyIdeas.no  Fact Sheet for Healthcare Providers: BankingDealers.co.za  This test is not yet approved or  cleared by the [place] FDA and has been authorized for detection and/or diagnosis of SARS-CoV-2 by FDA under an Emergency Use Authorization (EUA).  This EUA will remain in effect (meaning this test can be used) for the duration of the COVID-19 declaration under Section 564(b)(1) of the Act, 21 U.S.C. section  360bbb-3(b)(1), unless the authorization is terminated or revoked sooner.  Performed at [facility], [place] [address], [place], [place] [address]          Radiology Studies: No results found.      Scheduled Meds: . atorvastatin  40 mg Oral Daily  . dexamethasone (DECADRON) injection  4 mg Intravenous Q6H  . DULoxetine  60 mg Oral Daily  . enoxaparin (LOVENOX) injection  40 mg Subcutaneous Daily  . furosemide  40 mg Oral Daily  . gabapentin  300 mg Oral BID  . hydrALAZINE  10 mg Oral BID  . insulin aspart  0-15 Units Subcutaneous TID WC  . insulin aspart  0-5 Units Subcutaneous QHS  . insulin glargine  25 Units Subcutaneous Daily  . labetalol  100 mg Oral BID  . lisinopril  40 mg Oral Daily   Continuous Infusions:    LOS: 3 days    Time spent: 35 minutes spent on chart review, discussion with nursing staff, consultants, updating family and interview/physical exam; more than  50% of that time was spent in counseling and/or coordination of care.    Geniva Lohnes J [place] DO [facility] Available via Epic secure chat 7am-7pm After these hours, please refer to coverage provider listed on [email or web address] 02/29/2020, 11:29 AM

## 2020-03-01 DIAGNOSIS — I5030 Unspecified diastolic (congestive) heart failure: Secondary | ICD-10-CM | POA: Diagnosis not present

## 2020-03-01 LAB — GLUCOSE, CAPILLARY
Glucose-Capillary: 215 mg/dL — ABNORMAL HIGH (ref 70–99)
Glucose-Capillary: 277 mg/dL — ABNORMAL HIGH (ref 70–99)
Glucose-Capillary: 282 mg/dL — ABNORMAL HIGH (ref 70–99)
Glucose-Capillary: 300 mg/dL — ABNORMAL HIGH (ref 70–99)
Glucose-Capillary: 323 mg/dL — ABNORMAL HIGH (ref 70–99)

## 2020-03-01 LAB — C-REACTIVE PROTEIN: CRP: <0.5 mg/dL (ref <1.0)

## 2020-03-01 LAB — SEDIMENTATION RATE: Sed Rate: 6 mm/hr (ref 0–16)

## 2020-03-01 MED ORDER — INSULIN ASPART 100 UNIT/ML ~~LOC~~ SOLN
5.0000 [IU] | Freq: Three times a day (TID) | SUBCUTANEOUS | Status: DC
  Administered 2020-03-01 (×2): 5 [IU] via SUBCUTANEOUS

## 2020-03-01 MED ORDER — INSULIN GLARGINE 100 UNIT/ML ~~LOC~~ SOLN
30.0000 [IU] | Freq: Every day | SUBCUTANEOUS | Status: DC
  Administered 2020-03-01: 30 [IU] via SUBCUTANEOUS
  Filled 2020-03-01 (×2): qty 0.3

## 2020-03-01 NOTE — Consult Note (Signed)
   [facility] CM Inpatient Consult   03/01/2020  [name] Jun 12, 1961 [id number]   [place] Patient:  Medicare NextGen  Patient is currently active with [facility] for chronic disease management services.  Patient has been engaged by a [place] Management Coordinator.  Our community based plan of care has focused on disease management and community resource support.  Chart review reveals patient is being recommended for CIR. Spoke with patient at the bedside, HIPAA verified.  He states he is likely to have surgery sometime Friday. Patient denies any current issues with food resources, medication management or transportation needs.  Plan: Follow up with Inpatient Transition Of Care [TOC] team member to make aware that [place] [facility] following. Follow patient for progress and disposition.  Of note, [facility] Care Management services does not replace or interfere with any services that are needed or arranged by inpatient [facility] care management team.  For additional questions or referrals please contact:  Natividad Brood, RN BSN [facility] Liaison  [phone] business mobile phone Toll free office [phone]  Fax number: [phone] [email or web address]

## 2020-03-01 NOTE — Progress Notes (Signed)
Patient ID: [name], male   DOB: [birth year], 59 y.o.   MRN: [id number] Patient seen and examined today.  Did not know if his presence in the hospital until today.  I have reviewed his MRI which shows spinal stenosis at C3-4 and C4-5 with significant cord compression.  Sed rate and CRP are normal.  He is quite spastic and has clonus in the lower extremities at about 4 out of 5 strength in the upper and lower extremities.  He states this is stable and has had it for "weeks."  He is in a cervical collar.  Aspirin is being held.  I have recommended a posterior cervical decompression C3-4 and C4-5 followed by lateral mass fusion and fixation.  We hope to do this Friday.  Unfortunately I cannot get operative time before that.  Continue to hold aspirin.  Continue cervical collar.  Continue therapy.  He will likely need rehab after surgery.

## 2020-03-01 NOTE — Plan of Care (Signed)
  Problem: Activity: Goal: Risk for activity intolerance will decrease 03/01/2020 2225 by Barton Dubois, RN Outcome: Progressing 03/01/2020 2223 by Barton Dubois, RN Outcome: Progressing   Problem: Coping: Goal: Level of anxiety will decrease Outcome: Progressing

## 2020-03-01 NOTE — Progress Notes (Signed)
Physical Therapy Treatment Patient Details Name: [name] MRN: [id number] DOB: July 24, 1961 Today's Date: 03/01/2020    History of Present Illness Pt is 59 yo male who presented to ED with worsening numbness and weakness in bil UE and LE (reports chronic but worsened over past 3-4 weeks).  Pt's MRI revealed severe spinal stenosis C3-4, C4-5 with cord compression.  Neurosurgery has been consulted and recommended continuing PT/OT, IV steroids a few days, possible sx next week, and continuing to wear cervical collar.    PT Comments    Pt admitted with above diagnosis. Pt was able to stand x 2 to [place] plus with min to mod assist and cues standing for 1 min x 2 with footplate in place. Tolerates it well. Was not able to weight shift much to command. Pt stated it felt good to stand fully upright.  Pt currently with functional limitations due to balance and endurance deficits. Pt will benefit from skilled PT to increase their independence and safety with mobility to allow discharge to the venue listed below.     Follow Up Recommendations  CIR     Equipment Recommendations  None recommended by PT (has DME)    Recommendations for Other Services Rehab consult     Precautions / Restrictions Precautions Precautions: Other (comment) Precaution Comments: cervical Required Braces or Orthoses: Cervical Brace Cervical Brace: Hard collar;At all times Restrictions Weight Bearing Restrictions: No    Mobility  Bed Mobility Overal bed mobility: Needs Assistance Bed Mobility: Supine to Sit     Supine to sit: Min assist     General bed mobility comments: use of bed rail and assist to lift trunk  Transfers Overall transfer level: Needs assistance Equipment used: Ambulation equipment used Transfers: Sit to/from Stand Sit to Stand: Min assist;+2 safety/equipment;Mod assist         General transfer comment: bed height elevated, [name] plus in place.  Stood x 2 up to 1 min each with foot  plate on. Could not weight shift to command and fatigued rather quickly.   Ambulation/Gait                 Stairs             Wheelchair Mobility    Modified Rankin (Stroke Patients Only)       Balance Overall balance assessment: Needs assistance Sitting-balance support: No upper extremity supported Sitting balance-Leahy Scale: Good     Standing balance support: Bilateral upper extremity supported Standing balance-Leahy Scale: Poor Standing balance comment: required UE support on [name].                             Cognition Arousal/Alertness: Awake/alert Behavior During Therapy: WFL for tasks assessed/performed Overall Cognitive Status: Within Functional Limits for tasks assessed                                        Exercises      General Comments        Pertinent Vitals/Pain      Home Living                      Prior Function            PT Goals (current goals can now be found in the care plan section) Progress towards PT goals:  Progressing toward goals    Frequency    Min 3X/week      PT Plan Current plan remains appropriate    Co-evaluation              AM-PAC PT "6 Clicks" Mobility   Outcome Measure  Help needed turning from your back to your side while in a flat bed without using bedrails?: A Little Help needed moving from lying on your back to sitting on the side of a flat bed without using bedrails?: A Little Help needed moving to and from a bed to a chair (including a wheelchair)?: A Little Help needed standing up from a chair using your arms (e.g., wheelchair or bedside chair)?: A Lot Help needed to walk in hospital room?: A Lot Help needed climbing 3-5 steps with a railing? : Total 6 Click Score: 14    End of Session Equipment Utilized During Treatment: Gait belt;Cervical collar Activity Tolerance: Patient tolerated treatment well Patient left: with chair alarm set;in  chair;with call bell/phone within reach Nurse Communication: Mobility status;Other (comment);Need for lift equipment (recommended assist of 2 for safety with sara plus) PT Visit Diagnosis: Unsteadiness on feet (R26.81);Muscle weakness (generalized) (M62.81)     Time: 8381-8403 PT Time Calculation (min) (ACUTE ONLY): 18 min  Charges:  $Therapeutic Activity: 8-22 mins                     Dashea Mcmullan W,PT Acute Rehabilitation Services Pager:  [phone]  Office:  [place] 03/01/2020, 3:11 PM

## 2020-03-01 NOTE — Progress Notes (Signed)
PROGRESS NOTE    [name]  CBJ:[id number] DOB: 1960/08/21 DOA: 02/25/2020 PCP: Ladell Pier, MD    Brief Narrative:  [name] is a 59 y.o. male with medical history significant of hypertension, hyperlipidemia, diabetes mellitus, TIA, diabetic neuropathy, chronic diastolic CHF, cocaine abuse, tobacco abuse, presents to emergency department with worsening numbness and weakness in bilateral upper and lower extremities since 1 day.  He has diabetic neuropathy however since yesterday he has started feeling worsening of his numbness and tingling and weakness sensation in bilateral upper and lower extremities.  Tells me that he has difficulty lifting while picking up his utensils, unable to walk due to weakness.  Reports that his symptoms are persistent, no aggravating or relieving factors.  Denies association with recent trauma, fever, chills, headache, blurry vision, urinary, bowel incontinence.  He smokes cigarettes every day, denies alcohol use however uses cocaine now and then.  His last cocaine use was 2 days ago.  He lives alone at home and uses cane and walker for ambulation.  Upon arrival to ED: Patient's vital signs stable, afebrile, initial labs such as CBC, CMP: WNL.  MRI cervical: Thoracic spine shows moderate spinal stenosis and cord compression at C3-C4 and C4-C5.  Multilevel degenerative changes noted in thoracic spine.  EDP talk to neurosurgery.  [facility] hospitalist consulted for admission for cervical spinal stenosis with cord compression.   Assessment & Plan:   Principal Problem:   Cervical spinal cord compression (HCC) Active Problems:   Hyperlipidemia associated with type 2 diabetes mellitus (HCC)   Essential hypertension   Cocaine abuse ([place])   Type 2 diabetes mellitus (HCC)   Peripheral neuropathy   Diastolic congestive heart failure ([place])   Spinal stenosis, cervical region   Cervical stenosis with cord compression Myelopathy, quadriparesis Patient  presenting from home with progressive weakness to bilateral upper and lower extremities.  Acutely worsening over the past 1-2 days.  MR C/T-spine notable for moderate spinal stenosis with cord compression C3-4 and C4-5 with associated cord edema and multilevel degenerative changes noted in the thoracic spine. --Neurosurgery following, appreciate assistance --Continue Decadron 4 mg IV q6h --Maintain c-collar --PT/OT following --Tylenol, oxycodone, Dilaudid as needed for pain control --Neurosurgery considering surgical intervention next week per Dr. Arnoldo Morale, Dr. Marcello Moores, and Dr. Reatha Armour; Dr. Ronnald Ramp to follow for further recommendations; holding aspirin  Essential hypertension --Continue home hydralazine, labetalol, lisinopril --Continue statin --Holding aspirin as above for possible surgical invention early next week.  Type 2 diabetes mellitus Hemoglobin A1c 7.1, well controlled. --Hold home Metformin, glipizide, Farxiga --Increase Lantus to 30 units subcutaneously daily --NovoLog 5 units 3 times daily AC --Moderate dose insulin sliding scale for coverage while inpatient --CBGs every 4 hours AC/at bedtime  Hyperlipidemia --Atorvastatin 40 mg p.o. daily  Diabetic neuropathy --Continue Cymbalta and gabapentin  Chronic diastolic congestive heart failure, compensated --Continue labetalol, lisinopril --Furosemide 40 mg p.o. daily --Continue statin --Strict I's and O's and daily weights  Polysubstance abuse with cocaine, EtOH, tobacco Review of UDS, positive cocaine on 11/08/2019, 01/23/2016, 10/03/2015.  Counseled regarding cessation with patient verbalizing understanding.   DVT prophylaxis: Lovenox Code Status: Full code Family Communication: No family present at bedside  Disposition Plan:  Status is: Inpatient  Remains inpatient appropriate because:Ongoing active pain requiring inpatient pain management, Ongoing diagnostic testing needed not appropriate for outpatient work up, Unsafe  d/c plan and IV treatments appropriate due to intensity of illness or inability to take PO   Dispo: The patient is from: Home  Anticipated d/c is to: To be determined              Anticipated d/c date is: > 3 days              Patient currently is not medically stable to d/c.   Consultants:   Neurosurgery, Dr. Arnoldo Morale  Procedures:   None  Antimicrobials:   None   Subjective: Patient seen and examined bedside, resting comfortably lying in bed.  Continues to complain of irritation from c-collar.  Continues with numbness to bilateral hands with associated mild weakness.  Neurosurgery continues to follow with with recommendations of supportive care, IV steroids, PT/OT and consideration of possible surgical invention.  Patient denies headache, no visual changes, no chest pain, no palpitations, no shortness of breath, no abdominal pain.  No acute events overnight per nursing staff.  Objective: Vitals:   03/01/20 0407 03/01/20 0408 03/01/20 0823 03/01/20 1221  BP:  (!) 161/100 (!) 160/79 (!) 141/78  Pulse:  81 65 70  Resp:  18  19  Temp:  98.9 F (37.2 C)  98.3 F (36.8 C)  TempSrc:  Oral  Oral  SpO2:  96% 99% 100%  Weight: 105 kg     Height:        Intake/Output Summary (Last 24 hours) at 03/01/2020 1240 Last data filed at 03/01/2020 0830 Gross per 24 hour  Intake 240 ml  Output 400 ml  Net -160 ml   Filed Weights   02/29/20 0052 02/29/20 0500 03/01/20 0407  Weight: 109.4 kg 109.4 kg 105 kg    Examination:  General exam: Appears calm and comfortable  Respiratory system: Clear to auscultation. Respiratory effort normal. Cardiovascular system: S1 & S2 heard, RRR. No JVD, murmurs, rubs, gallops or clicks. No pedal edema. Gastrointestinal system: Abdomen is nondistended, soft and nontender. No organomegaly or masses felt. Normal bowel sounds heard. Central nervous system: Alert and oriented.  Muscle strength bilateral upper/lower extremities 4+/5, sensation to  light touch grossly intact, no focal neurological deficits. Extremities: Symmetric 4+/5 power bilat UE, 5/5 BLE. Skin: No rashes, lesions or ulcers Psychiatry: Judgement and insight appear poor. Mood & affect appropriate.     Data Reviewed: I have personally reviewed following labs and imaging studies  CBC: Recent Labs  Lab 02/25/20 1709 02/26/20 1822 02/27/20 0705 02/28/20 0624  WBC 10.2 9.6 8.4 17.7*  NEUTROABS  --  6.0  --   --   HGB 14.7 13.7 13.2 13.5  HCT 46.4 43.8 41.3 41.3  MCV 85.9 86.9 85.5 85.2  PLT 290 252 247 623   Basic Metabolic Panel: Recent Labs  Lab 02/25/20 1709 02/26/20 1822 02/27/20 0705 02/28/20 0624  NA 138 138 137 134*  K 4.5 4.5 5.0 4.2  CL 105 103 105 104  CO2 25 25 21* 19*  GLUCOSE 168* 209* 225* 238*  BUN 13 26* 31* 30*  CREATININE 1.20 1.46* 1.59* 1.12  CALCIUM 9.9 9.4 9.2 9.2  MG  --  2.1  --  2.1  PHOS  --  4.0  --   --    GFR: Estimated Creatinine Clearance: 90.8 mL/min (by C-G formula based on SCr of 1.12 mg/dL). Liver Function Tests: Recent Labs  Lab 02/26/20 1822 02/27/20 0705  AST 16 15  ALT 14 14  ALKPHOS 61 60  BILITOT 1.0 0.6  PROT 6.7 6.9  ALBUMIN 3.7 3.3*   No results for input(s): LIPASE, AMYLASE in the last 168 hours. No results for input(s): AMMONIA in  the last 168 hours. Coagulation Profile: No results for input(s): INR, PROTIME in the last 168 hours. Cardiac Enzymes: No results for input(s): CKTOTAL, CKMB, CKMBINDEX, TROPONINI in the last 168 hours. BNP (last 3 results) No results for input(s): PROBNP in the last 8760 hours. HbA1C: No results for input(s): HGBA1C in the last 72 hours. CBG: Recent Labs  Lab 02/29/20 1621 02/29/20 2137 03/01/20 0620 03/01/20 1202 03/01/20 1222  GLUCAP 335* 230* 215* 282* 277*   Lipid Profile: No results for input(s): CHOL, HDL, LDLCALC, TRIG, CHOLHDL, LDLDIRECT in the last 72 hours. Thyroid Function Tests: No results for input(s): TSH, T4TOTAL, FREET4, T3FREE,  THYROIDAB in the last 72 hours. Anemia Panel: No results for input(s): VITAMINB12, FOLATE, FERRITIN, TIBC, IRON, RETICCTPCT in the last 72 hours. Sepsis Labs: No results for input(s): PROCALCITON, LATICACIDVEN in the last 168 hours.  Recent Results (from the past 240 hour(s))  SARS Coronavirus 2 by RT PCR (hospital order, performed in [facility] hospital lab) Nasopharyngeal Nasopharyngeal Swab     Status: None   Collection Time: 02/26/20  6:50 PM   Specimen: Nasopharyngeal Swab  Result Value Ref Range Status   SARS Coronavirus 2 NEGATIVE NEGATIVE Final    Comment: (NOTE) SARS-CoV-2 target nucleic acids are NOT DETECTED.  The SARS-CoV-2 RNA is generally detectable in upper and lower respiratory specimens during the acute phase of infection. The lowest concentration of SARS-CoV-2 viral copies this assay can detect is 250 copies / mL. A negative result does not preclude SARS-CoV-2 infection and should not be used as the sole basis for treatment or other patient management decisions.  A negative result may occur with improper specimen collection / handling, submission of specimen other than nasopharyngeal swab, presence of viral mutation(s) within the areas targeted by this assay, and inadequate number of viral copies (<250 copies / mL). A negative result must be combined with clinical observations, patient history, and epidemiological information.  Fact Sheet for Patients:   StrictlyIdeas.no  Fact Sheet for Healthcare Providers: BankingDealers.co.za  This test is not yet approved or  cleared by the [place] FDA and has been authorized for detection and/or diagnosis of SARS-CoV-2 by FDA under an Emergency Use Authorization (EUA).  This EUA will remain in effect (meaning this test can be used) for the duration of the COVID-19 declaration under Section 564(b)(1) of the Act, 21 U.S.C. section 360bbb-3(b)(1), unless the authorization  is terminated or revoked sooner.  Performed at [facility], [place] [address], [place], [place] [address]          Radiology Studies: No results found.      Scheduled Meds: . atorvastatin  40 mg Oral Daily  . dexamethasone (DECADRON) injection  4 mg Intravenous Q6H  . DULoxetine  60 mg Oral Daily  . enoxaparin (LOVENOX) injection  40 mg Subcutaneous Daily  . furosemide  40 mg Oral Daily  . gabapentin  300 mg Oral BID  . hydrALAZINE  10 mg Oral BID  . insulin aspart  0-15 Units Subcutaneous TID WC  . insulin aspart  0-5 Units Subcutaneous QHS  . insulin aspart  5 Units Subcutaneous TID WC  . insulin glargine  30 Units Subcutaneous Daily  . labetalol  100 mg Oral BID  . lisinopril  40 mg Oral Daily   Continuous Infusions:    LOS: 4 days    Time spent: 35 minutes spent on chart review, discussion with nursing staff, consultants, updating family and interview/physical exam; more than 50% of  that time was spent in counseling and/or coordination of care.    Fenix Ruppe J [place] DO [facility] Available via Epic secure chat 7am-7pm After these hours, please refer to coverage provider listed on [email or web address] 03/01/2020, 12:40 PM

## 2020-03-01 NOTE — Plan of Care (Signed)
  Problem: Activity: Goal: Risk for activity intolerance will decrease Outcome: Progressing   Problem: Coping: Goal: Level of anxiety will decrease Outcome: Progressing   

## 2020-03-02 DIAGNOSIS — I5030 Unspecified diastolic (congestive) heart failure: Secondary | ICD-10-CM | POA: Diagnosis not present

## 2020-03-02 LAB — BASIC METABOLIC PANEL
Anion gap: 10 (ref 5–15)
BUN: 38 mg/dL — ABNORMAL HIGH (ref 8–23)
CO2: 23 mmol/L (ref 22–32)
Calcium: 9.3 mg/dL (ref 8.9–10.3)
Chloride: 105 mmol/L (ref 98–111)
Creatinine, Ser: 1.01 mg/dL (ref 0.61–1.24)
GFR calc Af Amer: >60 mL/min (ref >60)
GFR calc non Af Amer: >60 mL/min (ref >60)
Glucose, Bld: 206 mg/dL — ABNORMAL HIGH (ref 70–99)
Potassium: 4.9 mmol/L (ref 3.5–5.1)
Sodium: 138 mmol/L (ref 135–145)

## 2020-03-02 LAB — GLUCOSE, CAPILLARY
Glucose-Capillary: 185 mg/dL — ABNORMAL HIGH (ref 70–99)
Glucose-Capillary: 287 mg/dL — ABNORMAL HIGH (ref 70–99)
Glucose-Capillary: 201 mg/dL — ABNORMAL HIGH (ref 70–99)
Glucose-Capillary: 237 mg/dL — ABNORMAL HIGH (ref 70–99)

## 2020-03-02 LAB — CBC
HCT: 42.3 % (ref 39.0–52.0)
Hemoglobin: 13.7 g/dL (ref 13.0–17.0)
MCH: 27.2 pg (ref 26.0–34.0)
MCHC: 32.4 g/dL (ref 30.0–36.0)
MCV: 83.9 fL (ref 80.0–100.0)
Platelets: 266 10*3/uL (ref 150–400)
RBC: 5.04 MIL/uL (ref 4.22–5.81)
RDW: 13.9 % (ref 11.5–15.5)
WBC: 14.3 10*3/uL — ABNORMAL HIGH (ref 4.0–10.5)
nRBC: 0 % (ref 0.0–0.2)

## 2020-03-02 LAB — MAGNESIUM: Magnesium: 2 mg/dL (ref 1.7–2.4)

## 2020-03-02 MED ORDER — INSULIN ASPART 100 UNIT/ML ~~LOC~~ SOLN
8.0000 [IU] | Freq: Three times a day (TID) | SUBCUTANEOUS | Status: DC
  Administered 2020-03-02 – 2020-03-07 (×15): 8 [IU] via SUBCUTANEOUS

## 2020-03-02 MED ORDER — DEXAMETHASONE SODIUM PHOSPHATE 4 MG/ML IJ SOLN
4.0000 mg | Freq: Three times a day (TID) | INTRAMUSCULAR | Status: DC
  Administered 2020-03-02 – 2020-03-03 (×4): 4 mg via INTRAVENOUS
  Filled 2020-03-02 (×4): qty 1

## 2020-03-02 MED ORDER — INSULIN GLARGINE 100 UNIT/ML ~~LOC~~ SOLN
35.0000 [IU] | Freq: Every day | SUBCUTANEOUS | Status: DC
  Administered 2020-03-02 – 2020-03-03 (×2): 35 [IU] via SUBCUTANEOUS
  Filled 2020-03-02 (×2): qty 0.35

## 2020-03-02 MED ORDER — HYDRALAZINE HCL 25 MG PO TABS
25.0000 mg | ORAL_TABLET | Freq: Two times a day (BID) | ORAL | Status: DC
  Administered 2020-03-02 – 2020-03-11 (×19): 25 mg via ORAL
  Filled 2020-03-02 (×19): qty 1

## 2020-03-02 NOTE — Progress Notes (Signed)
PROGRESS NOTE    [name]  BSJ:[id number] DOB: 1960-07-30 DOA: 02/25/2020 PCP: Ladell Pier, MD    Brief Narrative:  [name] is a 59 y.o. male with medical history significant of hypertension, hyperlipidemia, diabetes mellitus, TIA, diabetic neuropathy, chronic diastolic CHF, cocaine abuse, tobacco abuse, presents to emergency department with worsening numbness and weakness in bilateral upper and lower extremities since 1 day.  He has diabetic neuropathy however since yesterday he has started feeling worsening of his numbness and tingling and weakness sensation in bilateral upper and lower extremities.  Tells me that he has difficulty lifting while picking up his utensils, unable to walk due to weakness.  Reports that his symptoms are persistent, no aggravating or relieving factors.  Denies association with recent trauma, fever, chills, headache, blurry vision, urinary, bowel incontinence.  He smokes cigarettes every day, denies alcohol use however uses cocaine now and then.  His last cocaine use was 2 days ago.  He lives alone at home and uses cane and walker for ambulation.  Upon arrival to ED: Patient's vital signs stable, afebrile, initial labs such as CBC, CMP: WNL.  MRI cervical: Thoracic spine shows moderate spinal stenosis and cord compression at C3-C4 and C4-C5.  Multilevel degenerative changes noted in thoracic spine.  EDP talk to neurosurgery.  [facility] hospitalist consulted for admission for cervical spinal stenosis with cord compression.   Assessment & Plan:   Principal Problem:   Cervical spinal cord compression (HCC) Active Problems:   Hyperlipidemia associated with type 2 diabetes mellitus (HCC)   Essential hypertension   Cocaine abuse ([place])   Type 2 diabetes mellitus (HCC)   Peripheral neuropathy   Diastolic congestive heart failure ([place])   Spinal stenosis, cervical region   Cervical stenosis with cord compression Myelopathy, quadriparesis Patient  presenting from home with progressive weakness to bilateral upper and lower extremities.  Acutely worsening over the past 1-2 days.  MR C/T-spine notable for moderate spinal stenosis with cord compression C3-4 and C4-5 with associated cord edema and multilevel degenerative changes noted in the thoracic spine. --Neurosurgery following, appreciate assistance --Continue Decadron 4 mg IV q6h --Maintain c-collar --PT/OT following --Tylenol, oxycodone, Dilaudid as needed for pain control --Neurosurgery, Dr. Ronnald Ramp plans surgical intervention 03/05/2020  Essential hypertension --Continue home labetalol, lisinopril --Increase hydralazine to 25 mg twice daily on 03/02/2020 --Continue statin --Holding aspirin as above for possible surgical invention early next week. --Continue monitor blood pressure closely and adjust as needed  Type 2 diabetes mellitus Hemoglobin A1c 7.1, well controlled. --Hold home Metformin, glipizide, Farxiga --Increase Lantus to 35 units subcutaneously daily --NovoLog 8 units TID AC --Moderate dose insulin sliding scale for coverage while inpatient --CBGs every 4 hours AC/at bedtime  Hyperlipidemia --Atorvastatin 40 mg p.o. daily  Diabetic neuropathy --Continue Cymbalta and gabapentin  Chronic diastolic congestive heart failure, compensated --Continue labetalol, lisinopril --Furosemide 40 mg p.o. daily --Continue statin --Strict I's and O's and daily weights  Polysubstance abuse with cocaine, EtOH, tobacco Review of UDS, positive cocaine on 11/08/2019, 01/23/2016, 10/03/2015.  Counseled regarding cessation with patient verbalizing understanding.   DVT prophylaxis: Lovenox Code Status: Full code Family Communication: No family present at bedside  Disposition Plan:  Status is: Inpatient  Remains inpatient appropriate because:Ongoing active pain requiring inpatient pain management, Ongoing diagnostic testing needed not appropriate for outpatient work up, Unsafe d/c  plan and IV treatments appropriate due to intensity of illness or inability to take PO   Dispo: The patient is from: Home  Anticipated d/c is to: To be determined              Anticipated d/c date is: > 3 days              Patient currently is not medically stable to d/c.   Consultants:   Neurosurgery, Dr. Arnoldo Morale  Procedures:   None  Antimicrobials:   None   Subjective: Patient seen and examined bedside, resting comfortably lying in bed.  Continues to complain of irritation from c-collar and would like to know if he can remove it in order to shave his beard.  Continues with numbness to bilateral hands with associated mild weakness.  Neurosurgery continues to follow with with recommendations of supportive care, IV steroids, PT/OT and plans surgical invention on Friday.  Patient denies headache, no visual changes, no chest pain, no palpitations, no shortness of breath, no abdominal pain.  No acute events overnight per nursing staff.  Objective: Vitals:   03/01/20 2056 03/02/20 0442 03/02/20 0451 03/02/20 1111  BP: (!) 184/89  (!) 191/88 (!) 158/68  Pulse: 68  62 63  Resp: 18  20 20   Temp: 98.3 F (36.8 C)  97.7 F (36.5 C) 98.7 F (37.1 C)  TempSrc: Oral  Oral Oral  SpO2: 100%  100% 100%  Weight:  106.6 kg    Height:        Intake/Output Summary (Last 24 hours) at 03/02/2020 1211 Last data filed at 03/02/2020 0848 Gross per 24 hour  Intake 817 ml  Output 900 ml  Net -83 ml   Filed Weights   02/29/20 0500 03/01/20 0407 03/02/20 0442  Weight: 109.4 kg 105 kg 106.6 kg    Examination:  General exam: Appears calm and comfortable  Respiratory system: Clear to auscultation. Respiratory effort normal. Cardiovascular system: S1 & S2 heard, RRR. No JVD, murmurs, rubs, gallops or clicks. No pedal edema. Gastrointestinal system: Abdomen is nondistended, soft and nontender. No organomegaly or masses felt. Normal bowel sounds heard. Central nervous system: Alert  and oriented.  Muscle strength bilateral upper/lower extremities 4+/5, sensation to light touch grossly intact, no focal neurological deficits. Extremities: Symmetric 4+/5 power bilat UE, 5/5 BLE. Skin: No rashes, lesions or ulcers Psychiatry: Judgement and insight appear poor. Mood & affect appropriate.     Data Reviewed: I have personally reviewed following labs and imaging studies  CBC: Recent Labs  Lab 02/25/20 1709 02/26/20 1822 02/27/20 0705 02/28/20 0624 03/02/20 0621  WBC 10.2 9.6 8.4 17.7* 14.3*  NEUTROABS  --  6.0  --   --   --   HGB 14.7 13.7 13.2 13.5 13.7  HCT 46.4 43.8 41.3 41.3 42.3  MCV 85.9 86.9 85.5 85.2 83.9  PLT 290 252 247 269 161   Basic Metabolic Panel: Recent Labs  Lab 02/25/20 1709 02/26/20 1822 02/27/20 0705 02/28/20 0624 03/02/20 0621  NA 138 138 137 134* 138  K 4.5 4.5 5.0 4.2 4.9  CL 105 103 105 104 105  CO2 25 25 21* 19* 23  GLUCOSE 168* 209* 225* 238* 206*  BUN 13 26* 31* 30* 38*  CREATININE 1.20 1.46* 1.59* 1.12 1.01  CALCIUM 9.9 9.4 9.2 9.2 9.3  MG  --  2.1  --  2.1 2.0  PHOS  --  4.0  --   --   --    GFR: Estimated Creatinine Clearance: 101.4 mL/min (by C-G formula based on SCr of 1.01 mg/dL). Liver Function Tests: Recent Labs  Lab 02/26/20 1822 02/27/20 0960  AST 16 15  ALT 14 14  ALKPHOS 61 60  BILITOT 1.0 0.6  PROT 6.7 6.9  ALBUMIN 3.7 3.3*   No results for input(s): LIPASE, AMYLASE in the last 168 hours. No results for input(s): AMMONIA in the last 168 hours. Coagulation Profile: No results for input(s): INR, PROTIME in the last 168 hours. Cardiac Enzymes: No results for input(s): CKTOTAL, CKMB, CKMBINDEX, TROPONINI in the last 168 hours. BNP (last 3 results) No results for input(s): PROBNP in the last 8760 hours. HbA1C: No results for input(s): HGBA1C in the last 72 hours. CBG: Recent Labs  Lab 03/01/20 1222 03/01/20 1633 03/01/20 2123 03/02/20 0609 03/02/20 1111  GLUCAP 277* 300* 323* 185* 287*    Lipid Profile: No results for input(s): CHOL, HDL, LDLCALC, TRIG, CHOLHDL, LDLDIRECT in the last 72 hours. Thyroid Function Tests: No results for input(s): TSH, T4TOTAL, FREET4, T3FREE, THYROIDAB in the last 72 hours. Anemia Panel: No results for input(s): VITAMINB12, FOLATE, FERRITIN, TIBC, IRON, RETICCTPCT in the last 72 hours. Sepsis Labs: No results for input(s): PROCALCITON, LATICACIDVEN in the last 168 hours.  Recent Results (from the past 240 hour(s))  SARS Coronavirus 2 by RT PCR (hospital order, performed in [facility] hospital lab) Nasopharyngeal Nasopharyngeal Swab     Status: None   Collection Time: 02/26/20  6:50 PM   Specimen: Nasopharyngeal Swab  Result Value Ref Range Status   SARS Coronavirus 2 NEGATIVE NEGATIVE Final    Comment: (NOTE) SARS-CoV-2 target nucleic acids are NOT DETECTED.  The SARS-CoV-2 RNA is generally detectable in upper and lower respiratory specimens during the acute phase of infection. The lowest concentration of SARS-CoV-2 viral copies this assay can detect is 250 copies / mL. A negative result does not preclude SARS-CoV-2 infection and should not be used as the sole basis for treatment or other patient management decisions.  A negative result may occur with improper specimen collection / handling, submission of specimen other than nasopharyngeal swab, presence of viral mutation(s) within the areas targeted by this assay, and inadequate number of viral copies (<250 copies / mL). A negative result must be combined with clinical observations, patient history, and epidemiological information.  Fact Sheet for Patients:   StrictlyIdeas.no  Fact Sheet for Healthcare Providers: BankingDealers.co.za  This test is not yet approved or  cleared by the [place] FDA and has been authorized for detection and/or diagnosis of SARS-CoV-2 by FDA under an Emergency Use Authorization (EUA).  This EUA will  remain in effect (meaning this test can be used) for the duration of the COVID-19 declaration under Section 564(b)(1) of the Act, 21 U.S.C. section 360bbb-3(b)(1), unless the authorization is terminated or revoked sooner.  Performed at [facility], [place] [address] [place], [place] [address]          Radiology Studies: No results found.      Scheduled Meds:  atorvastatin  40 mg Oral Daily   dexamethasone (DECADRON) injection  4 mg Intravenous Q8H   DULoxetine  60 mg Oral Daily   enoxaparin (LOVENOX) injection  40 mg Subcutaneous Daily   furosemide  40 mg Oral Daily   gabapentin  300 mg Oral BID   hydrALAZINE  25 mg Oral BID   insulin aspart  0-15 Units Subcutaneous TID WC   insulin aspart  0-5 Units Subcutaneous QHS   insulin aspart  8 Units Subcutaneous TID WC   insulin glargine  35 Units Subcutaneous Daily   labetalol  100 mg Oral BID  lisinopril  40 mg Oral Daily   Continuous Infusions:    LOS: 5 days    Time spent: 35 minutes spent on chart review, discussion with nursing staff, consultants, updating family and interview/physical exam; more than 50% of that time was spent in counseling and/or coordination of care.    Curtis Desa J [place] DO [facility] Available via Epic secure chat 7am-7pm After these hours, please refer to coverage provider listed on [email or web address] 03/02/2020, 12:11 PM

## 2020-03-02 NOTE — Progress Notes (Signed)
Patient ID: [name], male   DOB: [birth year], 59 y.o.   MRN: [id number] Has neck stiffness and soreness, some arm aching, some N in hands unchanged, motor exam stable with hypertonicity and 4-5 strength. Plan is for OR on Friday. continue collar.

## 2020-03-02 NOTE — Progress Notes (Signed)
Pt. Unable to stand for daily weight.

## 2020-03-02 NOTE — Progress Notes (Signed)
Occupational Therapy Treatment Patient Details Name: [name] MRN: [id number] DOB: 1960-10-27 Today's Date: 03/02/2020    History of present illness Pt is 59 yo male who presented to ED with worsening numbness and weakness in bil UE and LE (reports chronic but worsened over past 3-4 weeks).  Pt's MRI revealed severe spinal stenosis C3-4, C4-5 with cord compression.  Neurosurgery has been consulted and recommended continuing PT/OT, IV steroids a few days, possible sx next week, and continuing to wear cervical collar.   OT comments  Pt pleasant and agreeable to working with therapy. Pt engaging in seated ADL and UB/LB A/AAROM while EOB during session. Pt requiring supervision for static balance EOB and up to modA for bed mobility. Pt requiring cues for cervical precautions as well as for carryover after education provided, he will benefit from continued review and education as well as use of precautions within ADL context. Feel he remains appropriate for CIR level therapies at time of discharge. Will continue to follow while acutely admitted.   Follow Up Recommendations  CIR    Equipment Recommendations  Other (comment) (TBA )          Precautions / Restrictions Precautions Precautions: Other (comment) Precaution Comments: cervical Required Braces or Orthoses: Cervical Brace Cervical Brace: Hard collar;At all times Restrictions Weight Bearing Restrictions: No       Mobility Bed Mobility Overal bed mobility: Needs Assistance Bed Mobility: Supine to Sit;Sit to Supine     Supine to sit: Min assist Sit to supine: Mod assist   General bed mobility comments: use of bed rail and assist to lift trunk, assist for LEs onto bed   Transfers                      Balance Overall balance assessment: Needs assistance Sitting-balance support: No upper extremity supported Sitting balance-Leahy Scale: Good                                     ADL either  performed or assessed with clinical judgement   ADL Overall ADL's : Needs assistance/impaired Eating/Feeding: Set up;Sitting Eating/Feeding Details (indicate cue type and reason): requires setup to open containers end of session  Grooming: Wash/dry face;Min guard;Sitting Grooming Details (indicate cue type and reason): pt requiring use of bil hands to perform task given UE weakness                        Toileting - Clothing Manipulation Details (indicate cue type and reason): pt attempting to use urinal at EOB, supervision for balance        General ADL Comments: pt tolerating EOB activity today including seated ADL and UB/LB AROM                       Cognition Arousal/Alertness: Awake/alert Behavior During Therapy: WFL for tasks assessed/performed Overall Cognitive Status: Impaired/Different from baseline Area of Impairment: Memory                     Memory: Decreased recall of precautions         General Comments: some difficulty with processing certain instruction. educated on cervical precautions but with reduced carryover         Exercises Exercises: General Upper Extremity;General Lower Extremity General Exercises - Upper Extremity Shoulder Flexion: AAROM;Both;10 reps;Seated (to 90*) Digit  Composite Flexion: AROM;Both;10 reps Composite Extension: AROM;Both;10 reps;Seated General Exercises - Lower Extremity Long Arc Quad: AROM;Both;10 reps;Seated Hip Flexion/Marching: AROM;Both;10 reps;Seated   Shoulder Instructions       General Comments      Pertinent Vitals/ Pain       Pain Assessment: Faces Faces Pain Scale: Hurts even more Pain Location: generalized, headache Pain Descriptors / Indicators: Headache Pain Intervention(s): Monitored during session;Repositioned;Premedicated before session  Home Living                                          Prior Functioning/Environment              Frequency  Min  2X/week        Progress Toward Goals  OT Goals(current goals can now be found in the care plan section)  Progress towards OT goals: Progressing toward goals  Acute Rehab OT Goals Patient Stated Goal: regain strength OT Goal Formulation: With patient Time For Goal Achievement: 03/12/20 Potential to Achieve Goals: Good ADL Goals Pt Will Perform Upper Body Dressing: with set-up;sitting Pt Will Perform Lower Body Dressing: with min assist;sit to/from stand;sitting/lateral leans Pt Will Transfer to Toilet: ambulating;bedside commode;with min guard assist Pt Will Perform Toileting - Clothing Manipulation and hygiene: with min assist;sit to/from stand;sitting/lateral leans  Plan Discharge plan remains appropriate    Co-evaluation                 AM-PAC OT "6 Clicks" Daily Activity     Outcome Measure   Help from another person eating meals?: A Little Help from another person taking care of personal grooming?: A Little Help from another person toileting, which includes using toliet, bedpan, or urinal?: A Lot Help from another person bathing (including washing, rinsing, drying)?: A Lot Help from another person to put on and taking off regular upper body clothing?: A Little Help from another person to put on and taking off regular lower body clothing?: A Lot 6 Click Score: 15    End of Session Equipment Utilized During Treatment: Cervical collar  OT Visit Diagnosis: Unsteadiness on feet (R26.81);Other abnormalities of gait and mobility (R26.89)   Activity Tolerance Patient tolerated treatment well   Patient Left in bed;with call bell/phone within reach;with bed alarm set   Nurse Communication Mobility status        Time: 1017-5102 OT Time Calculation (min): 33 min  Charges: OT General Charges $OT Visit: 1 Visit OT Treatments $Self Care/Home Management : 8-22 mins $Therapeutic Activity: 8-22 mins  [name], OT Acute Rehabilitation Services Pager  [phone] Office [phone]    [name] 03/02/2020, 12:55 PM

## 2020-03-03 ENCOUNTER — Institutional Professional Consult (permissible substitution): Payer: Medicare Other | Admitting: Licensed Clinical Social Worker

## 2020-03-03 DIAGNOSIS — I10 Essential (primary) hypertension: Secondary | ICD-10-CM

## 2020-03-03 DIAGNOSIS — I5030 Unspecified diastolic (congestive) heart failure: Secondary | ICD-10-CM | POA: Diagnosis not present

## 2020-03-03 DIAGNOSIS — R531 Weakness: Secondary | ICD-10-CM

## 2020-03-03 LAB — GLUCOSE, CAPILLARY
Glucose-Capillary: 173 mg/dL — ABNORMAL HIGH (ref 70–99)
Glucose-Capillary: 218 mg/dL — ABNORMAL HIGH (ref 70–99)
Glucose-Capillary: 122 mg/dL — ABNORMAL HIGH (ref 70–99)
Glucose-Capillary: 126 mg/dL — ABNORMAL HIGH (ref 70–99)

## 2020-03-03 LAB — BASIC METABOLIC PANEL
Anion gap: 11 (ref 5–15)
BUN: 33 mg/dL — ABNORMAL HIGH (ref 8–23)
CO2: 23 mmol/L (ref 22–32)
Calcium: 9.1 mg/dL (ref 8.9–10.3)
Chloride: 104 mmol/L (ref 98–111)
Creatinine, Ser: 0.94 mg/dL (ref 0.61–1.24)
GFR calc Af Amer: >60 mL/min (ref >60)
GFR calc non Af Amer: >60 mL/min (ref >60)
Glucose, Bld: 175 mg/dL — ABNORMAL HIGH (ref 70–99)
Potassium: 4.3 mmol/L (ref 3.5–5.1)
Sodium: 138 mmol/L (ref 135–145)

## 2020-03-03 MED ORDER — DEXAMETHASONE SODIUM PHOSPHATE 4 MG/ML IJ SOLN
2.0000 mg | Freq: Three times a day (TID) | INTRAMUSCULAR | Status: DC
  Administered 2020-03-03 – 2020-03-05 (×6): 2 mg via INTRAVENOUS
  Filled 2020-03-03 (×8): qty 0.5

## 2020-03-03 MED ORDER — INSULIN GLARGINE 100 UNIT/ML ~~LOC~~ SOLN
40.0000 [IU] | Freq: Every day | SUBCUTANEOUS | Status: DC
  Administered 2020-03-04 – 2020-03-07 (×4): 40 [IU] via SUBCUTANEOUS
  Filled 2020-03-03 (×4): qty 0.4

## 2020-03-03 NOTE — Progress Notes (Signed)
Physical Therapy Treatment Patient Details Name: [name] MRN: [id number] DOB: [birth year] Today's Date: 03/03/2020    History of Present Illness Pt is 59 yo male who presented to ED with worsening numbness and weakness in bil UE and LE (reports chronic but worsened over past 3-4 weeks).  Pt's MRI revealed severe spinal stenosis C3-4, C4-5 with cord compression.  Neurosurgery has been consulted and recommended continuing PT/OT, IV steroids a few days, possible sx next week, and continuing to wear cervical collar.    PT Comments    Pt admitted with above diagnosis. Pt was able to ambulate with [name] plus 15 feet with foot plate removed with +2 min to mod assist as he needed assist to steer [name] plus and move it forward.  Knees buckling at times onto the knee pads.  Followed with chair and pt had to sit after 15 feet and couldn't tolerate second walk therefore did some exercises.  Pt currently with functional limitations due to balance and endurance deficits. Pt will benefit from skilled PT to increase their independence and safety with mobility to allow discharge to the venue listed below.     Follow Up Recommendations  CIR     Equipment Recommendations  None recommended by PT (has DME)    Recommendations for Other Services Rehab consult     Precautions / Restrictions Precautions Precautions: Other (comment) Precaution Comments: cervical Required Braces or Orthoses: Cervical Brace Cervical Brace: Hard collar;At all times Restrictions Weight Bearing Restrictions: No    Mobility  Bed Mobility Overal bed mobility: Needs Assistance Bed Mobility: Supine to Sit;Sit to Supine     Supine to sit: Min assist;Mod assist     General bed mobility comments: use of bed rail and assist to lift trunk  Transfers Overall transfer level: Needs assistance Equipment used: Ambulation equipment used Transfers: Sit to/from Stand Sit to Stand: Min assist;+2 safety/equipment;Mod assist          General transfer comment: bed height elevated, [name] in place with foot plate removed.  Ambulation/Gait Ambulation/Gait assistance: Min assist;+2 safety/equipment;Mod assist Gait Distance (Feet): 15 Feet Assistive device:  ([name]) Gait Pattern/deviations: Step-to pattern;Decreased stride length;Shuffle;Ataxic;Narrow base of support;Drifts right/left Gait velocity: decreased Gait velocity interpretation: <1.31 ft/sec, indicative of household ambulator General Gait Details: Pt was able to progress steps in [name] almost to door.  Left the knee pad in place to prevent buckling.  Pt took small steps but almost had to due to the [name]. Attempted a second walk but pt felt too weak upon standing the second time.    Stairs             Wheelchair Mobility    Modified Rankin (Stroke Patients Only)       Balance Overall balance assessment: Needs assistance Sitting-balance support: No upper extremity supported Sitting balance-Leahy Scale: Good     Standing balance support: Bilateral upper extremity supported Standing balance-Leahy Scale: Poor Standing balance comment: required UE support on [name].                             Cognition Arousal/Alertness: Awake/alert Behavior During Therapy: WFL for tasks assessed/performed Overall Cognitive Status: Impaired/Different from baseline Area of Impairment: Memory                     Memory: Decreased recall of precautions         General Comments:  some difficulty with processing certain instruction. educated on cervical precautions but with reduced carryover       Exercises General Exercises - Lower Extremity Ankle Circles/Pumps: AROM;Both;10 reps;Seated Long Arc Quad: AROM;Both;10 reps;Seated Hip Flexion/Marching: AROM;Both;10 reps;Seated    General Comments        Pertinent Vitals/Pain Pain Assessment: Faces Faces Pain Scale: Hurts even more Pain Location:  generalized, headache Pain Descriptors / Indicators: Headache Pain Intervention(s): Limited activity within patient's tolerance;Monitored during session;Repositioned    Home Living                      Prior Function            PT Goals (current goals can now be found in the care plan section) Acute Rehab PT Goals Patient Stated Goal: regain strength Progress towards PT goals: Progressing toward goals    Frequency    Min 3X/week      PT Plan Current plan remains appropriate    Co-evaluation              AM-PAC PT "6 Clicks" Mobility   Outcome Measure  Help needed turning from your back to your side while in a flat bed without using bedrails?: A Little Help needed moving from lying on your back to sitting on the side of a flat bed without using bedrails?: A Little Help needed moving to and from a bed to a chair (including a wheelchair)?: A Little Help needed standing up from a chair using your arms (e.g., wheelchair or bedside chair)?: A Lot Help needed to walk in hospital room?: A Lot Help needed climbing 3-5 steps with a railing? : Total 6 Click Score: 14    End of Session Equipment Utilized During Treatment: Gait belt;Cervical collar Activity Tolerance: Patient tolerated treatment well;Patient limited by fatigue Patient left: with chair alarm set;in chair;with call bell/phone within reach Nurse Communication: Mobility status;Other (comment);Need for lift equipment (recommended assist of 2 for safety with sara plus) PT Visit Diagnosis: Unsteadiness on feet (R26.81);Muscle weakness (generalized) (M62.81)     Time: 9024-0973 PT Time Calculation (min) (ACUTE ONLY): 21 min  Charges:  $Gait Training: 8-22 mins                     Anshul Meddings W,PT Mobile Pager:  [phone]  Office:  [name] 03/03/2020, 1:27 PM

## 2020-03-03 NOTE — Progress Notes (Signed)
PROGRESS NOTE    [name]  JKD:[id number] DOB: 12/04/1960 DOA: 02/25/2020 PCP: Ladell Pier, MD    Brief Narrative:  59 y.o.malewith medical history significant ofhypertension, hyperlipidemia, diabetes mellitus, TIA, diabetic neuropathy, chronic diastolic CHF, cocaine abuse, tobacco abuse, presents to emergency department with worsening numbness and weakness in bilateral upper and lower extremities since 1 day.  He has diabetic neuropathy however since yesterday he has started feeling worsening of his numbness and tingling and weakness sensation in bilateral upper and lower extremities. Tells me that he has difficulty lifting while picking up his utensils, unable to walk due to weakness. Reports that his symptoms are persistent, no aggravating or relieving factors. Denies association with recent trauma, fever, chills, headache, blurry vision, urinary, bowel incontinence.  He smokes cigarettes every day, denies alcohol use however uses cocaine now and then. His last cocaine use was 2 days ago. He lives alone at home and uses cane and walker for ambulation.  Upon arrival to ED: Patient's vital signs stable, afebrile, initial labs such as CBC, CMP: WNL. MRI cervical: Thoracic spine shows moderate spinal stenosis and cord compression at C3-C4 and C4-C5. Multilevel degenerative changes noted in thoracic spine. EDP talk to neurosurgery.[facility] hospitalist consulted for admission for cervical spinal stenosis with cord compression  Assessment & Plan:   Principal Problem:   Cervical spinal cord compression (HCC) Active Problems:   Hyperlipidemia associated with type 2 diabetes mellitus ([place])   Essential hypertension   Cocaine abuse ([place])   Type 2 diabetes mellitus (HCC)   Peripheral neuropathy   Diastolic congestive heart failure ([place])   Spinal stenosis, cervical region   Cervical stenosis with cord compression Myelopathy, quadriparesis Patient presenting from home  with progressive weakness to bilateral upper and lower extremities.  Acutely worsening over the past 1-2 days.  MR C/T-spine notable for moderate spinal stenosis with cord compression C3-4 and C4-5 with associated cord edema and multilevel degenerative changes noted in the thoracic spine. --Neurosurgery following, appreciate assistance --pt is continued on Decadron 2mg  IV q8hrs --Maintain c-collar --PT/OT following --Tylenol, oxycodone, continue with Dilaudid as needed for pain control --Neurosurgery, Dr. Ronnald Ramp plans surgical intervention 03/05/2020  Essential hypertension --Continue home labetalol, lisinopril as tolerated --Increase hydralazine to 25 mg twice daily on 03/02/2020 --Continue statin --Holding aspirin as above for possible surgical invention early next week. --Continue to monitor blood pressure closely and adjust as needed  Type 2 diabetes mellitus Hemoglobin A1c 7.1, well controlled. --Hold home Metformin, glipizide, [place] --Currently on Lantus to 35 units subcutaneously daily with NovoLog 8 units TID AC --glucose trends in the 200 range. Will increase lantus to 40 units --Moderate dose insulin sliding scale for coverage while inpatient --Cont to check CBGs every 4 hours AC/at bedtime  Hyperlipidemia --Atorvastatin 40 mg p.o. daily as tolerated  Diabetic neuropathy --Continue Cymbalta and gabapentin  Chronic diastolic congestive heart failure, compensated --Continue labetalol, lisinopril --continue with Furosemide 40 mg p.o. daily --Continue statin as tolerated --Strict I's and O's and daily weights  Polysubstance abuse with cocaine, EtOH, tobacco --Review of UDS, positive cocaine on 11/08/2019, 01/23/2016, 10/03/2015.  Counseled regarding cessation with patient verbalizing understanding.  DVT prophylaxis: SCD's Code Status: Full Family Communication: Pt in room, family not at bedside  Status is: Inpatient  Remains inpatient appropriate because:Ongoing  diagnostic testing needed not appropriate for outpatient work up   Dispo:  Patient From: Home  Planned Disposition: Inpatient Rehab  Expected discharge date: 03/08/20  Medically stable for discharge: No  Consultants:   Neurosurgery  Procedures:     Antimicrobials: Anti-infectives (From admission, onward)   None       Subjective: Without complaints at this time  Objective: Vitals:   03/02/20 1931 03/03/20 0547 03/03/20 0817 03/03/20 1143  BP: (!) 160/79 (!) 162/76 (!) 168/84 140/76  Pulse: 68 65 61 64  Resp: 16 14  20   Temp: 98.3 F (36.8 C) 98.5 F (36.9 C) 97.9 F (36.6 C) 98.5 F (36.9 C)  TempSrc: Oral Oral Oral Oral  SpO2: 99% 100% 95% 96%  Weight:  107.3 kg    Height:        Intake/Output Summary (Last 24 hours) at 03/03/2020 1757 Last data filed at 03/03/2020 1321 Gross per 24 hour  Intake 480 ml  Output 2025 ml  Net -1545 ml   Filed Weights   03/01/20 0407 03/02/20 0442 03/03/20 0547  Weight: 105 kg 106.6 kg 107.3 kg    Examination:  General exam: Appears calm and comfortable, neck brace in place Respiratory system: Clear to auscultation. Respiratory effort normal. Cardiovascular system: S1 & S2 heard, Regular Gastrointestinal system: Abdomen is nondistended, soft and nontender. No organomegaly or masses felt. Normal bowel sounds heard. Central nervous system: Alert and oriented. No focal neurological deficits. Extremities: Symmetric 5 x 5 power. Skin: No rashes, lesions Psychiatry: Judgement and insight appear normal. Mood & affect appropriate.   Data Reviewed: I have personally reviewed following labs and imaging studies  CBC: Recent Labs  Lab 02/26/20 1822 02/27/20 0705 02/28/20 0624 03/02/20 0621  WBC 9.6 8.4 17.7* 14.3*  NEUTROABS 6.0  --   --   --   HGB 13.7 13.2 13.5 13.7  HCT 43.8 41.3 41.3 42.3  MCV 86.9 85.5 85.2 83.9  PLT 252 247 269 093   Basic Metabolic Panel: Recent Labs  Lab 02/26/20 1822  02/27/20 0705 02/28/20 0624 03/02/20 0621 03/03/20 0701  NA 138 137 134* 138 138  K 4.5 5.0 4.2 4.9 4.3  CL 103 105 104 105 104  CO2 25 21* 19* 23 23  GLUCOSE 209* 225* 238* 206* 175*  BUN 26* 31* 30* 38* 33*  CREATININE 1.46* 1.59* 1.12 1.01 0.94  CALCIUM 9.4 9.2 9.2 9.3 9.1  MG 2.1  --  2.1 2.0  --   PHOS 4.0  --   --   --   --    GFR: Estimated Creatinine Clearance: 109.3 mL/min (by C-G formula based on SCr of 0.94 mg/dL). Liver Function Tests: Recent Labs  Lab 02/26/20 1822 02/27/20 0705  AST 16 15  ALT 14 14  ALKPHOS 61 60  BILITOT 1.0 0.6  PROT 6.7 6.9  ALBUMIN 3.7 3.3*   No results for input(s): LIPASE, AMYLASE in the last 168 hours. No results for input(s): AMMONIA in the last 168 hours. Coagulation Profile: No results for input(s): INR, PROTIME in the last 168 hours. Cardiac Enzymes: No results for input(s): CKTOTAL, CKMB, CKMBINDEX, TROPONINI in the last 168 hours. BNP (last 3 results) No results for input(s): PROBNP in the last 8760 hours. HbA1C: No results for input(s): HGBA1C in the last 72 hours. CBG: Recent Labs  Lab 03/02/20 1111 03/02/20 1624 03/02/20 2131 03/03/20 0548 03/03/20 1107  GLUCAP 287* 201* 237* 173* 218*   Lipid Profile: No results for input(s): CHOL, HDL, LDLCALC, TRIG, CHOLHDL, LDLDIRECT in the last 72 hours. Thyroid Function Tests: No results for input(s): TSH, T4TOTAL, FREET4, T3FREE, THYROIDAB in the last 72 hours. Anemia Panel: No results for input(s):  VITAMINB12, FOLATE, FERRITIN, TIBC, IRON, RETICCTPCT in the last 72 hours. Sepsis Labs: No results for input(s): PROCALCITON, LATICACIDVEN in the last 168 hours.  Recent Results (from the past 240 hour(s))  SARS Coronavirus 2 by RT PCR (hospital order, performed in [facility] hospital lab) Nasopharyngeal Nasopharyngeal Swab     Status: None   Collection Time: 02/26/20  6:50 PM   Specimen: Nasopharyngeal Swab  Result Value Ref Range Status   SARS Coronavirus 2 NEGATIVE  NEGATIVE Final    Comment: (NOTE) SARS-CoV-2 target nucleic acids are NOT DETECTED.  The SARS-CoV-2 RNA is generally detectable in upper and lower respiratory specimens during the acute phase of infection. The lowest concentration of SARS-CoV-2 viral copies this assay can detect is 250 copies / mL. A negative result does not preclude SARS-CoV-2 infection and should not be used as the sole basis for treatment or other patient management decisions.  A negative result may occur with improper specimen collection / handling, submission of specimen other than nasopharyngeal swab, presence of viral mutation(s) within the areas targeted by this assay, and inadequate number of viral copies (<250 copies / mL). A negative result must be combined with clinical observations, patient history, and epidemiological information.  Fact Sheet for Patients:   StrictlyIdeas.no  Fact Sheet for Healthcare Providers: BankingDealers.co.za  This test is not yet approved or  cleared by the [place] FDA and has been authorized for detection and/or diagnosis of SARS-CoV-2 by FDA under an Emergency Use Authorization (EUA).  This EUA will remain in effect (meaning this test can be used) for the duration of the COVID-19 declaration under Section 564(b)(1) of the Act, 21 U.S.C. section 360bbb-3(b)(1), unless the authorization is terminated or revoked sooner.  Performed at [facility], [place] [address] [place], [place] [address]      Radiology Studies: No results found.  Scheduled Meds: . atorvastatin  40 mg Oral Daily  . dexamethasone (DECADRON) injection  2 mg Intravenous Q8H  . DULoxetine  60 mg Oral Daily  . furosemide  40 mg Oral Daily  . gabapentin  300 mg Oral BID  . hydrALAZINE  25 mg Oral BID  . insulin aspart  0-15 Units Subcutaneous TID WC  . insulin aspart  0-5 Units Subcutaneous QHS  . insulin aspart  8 Units Subcutaneous TID WC  .  insulin glargine  35 Units Subcutaneous Daily  . labetalol  100 mg Oral BID  . lisinopril  40 mg Oral Daily   Continuous Infusions:   LOS: 6 days   Marylu Lund, MD [facility] Pager On Amion  If 7PM-7AM, please contact night-coverage 03/03/2020, 5:57 PM

## 2020-03-04 ENCOUNTER — Ambulatory Visit: Payer: Medicare Other | Admitting: Physician Assistant

## 2020-03-04 DIAGNOSIS — F1410 Cocaine abuse, uncomplicated: Secondary | ICD-10-CM

## 2020-03-04 DIAGNOSIS — R531 Weakness: Secondary | ICD-10-CM | POA: Diagnosis not present

## 2020-03-04 DIAGNOSIS — I10 Essential (primary) hypertension: Secondary | ICD-10-CM | POA: Diagnosis not present

## 2020-03-04 LAB — GLUCOSE, CAPILLARY
Glucose-Capillary: 141 mg/dL — ABNORMAL HIGH (ref 70–99)
Glucose-Capillary: 234 mg/dL — ABNORMAL HIGH (ref 70–99)
Glucose-Capillary: 133 mg/dL — ABNORMAL HIGH (ref 70–99)
Glucose-Capillary: 59 mg/dL — ABNORMAL LOW (ref 70–99)
Glucose-Capillary: 79 mg/dL (ref 70–99)

## 2020-03-04 LAB — SURGICAL PCR SCREEN
MRSA, PCR: NEGATIVE
Staphylococcus aureus: POSITIVE — AB

## 2020-03-04 MED ORDER — CHLORHEXIDINE GLUCONATE CLOTH 2 % EX PADS
6.0000 | MEDICATED_PAD | Freq: Every day | CUTANEOUS | Status: DC
  Administered 2020-03-05: 6 via TOPICAL

## 2020-03-04 MED ORDER — MUPIROCIN 2 % EX OINT
1.0000 "application " | TOPICAL_OINTMENT | Freq: Two times a day (BID) | CUTANEOUS | Status: AC
  Administered 2020-03-04 – 2020-03-09 (×8): 1 via NASAL
  Filled 2020-03-04 (×2): qty 22

## 2020-03-04 NOTE — Progress Notes (Signed)
PROGRESS NOTE    [name]  TIW:[id number] DOB: 05/12/1961 DOA: 02/25/2020 PCP: Ladell Pier, MD    Brief Narrative:  59 y.o.malewith medical history significant ofhypertension, hyperlipidemia, diabetes mellitus, TIA, diabetic neuropathy, chronic diastolic CHF, cocaine abuse, tobacco abuse, presents to emergency department with worsening numbness and weakness in bilateral upper and lower extremities since 1 day.  He has diabetic neuropathy however since yesterday he has started feeling worsening of his numbness and tingling and weakness sensation in bilateral upper and lower extremities. Tells me that he has difficulty lifting while picking up his utensils, unable to walk due to weakness. Reports that his symptoms are persistent, no aggravating or relieving factors. Denies association with recent trauma, fever, chills, headache, blurry vision, urinary, bowel incontinence.  He smokes cigarettes every day, denies alcohol use however uses cocaine now and then. His last cocaine use was 2 days ago. He lives alone at home and uses cane and walker for ambulation.  Upon arrival to ED: Patient's vital signs stable, afebrile, initial labs such as CBC, CMP: WNL. MRI cervical: Thoracic spine shows moderate spinal stenosis and cord compression at C3-C4 and C4-C5. Multilevel degenerative changes noted in thoracic spine. EDP talk to neurosurgery.[facility] hospitalist consulted for admission for cervical spinal stenosis with cord compression  Assessment & Plan:   Principal Problem:   Cervical spinal cord compression (HCC) Active Problems:   Hyperlipidemia associated with type 2 diabetes mellitus ([place])   Essential hypertension   Cocaine abuse ([place])   Type 2 diabetes mellitus (HCC)   Peripheral neuropathy   Diastolic congestive heart failure ([place])   Spinal stenosis, cervical region   Cervical stenosis with cord compression Myelopathy, quadriparesis Patient presenting from home  with progressive weakness to bilateral upper and lower extremities.  Acutely worsening over the past 1-2 days.  MR C/T-spine notable for moderate spinal stenosis with cord compression C3-4 and C4-5 with associated cord edema and multilevel degenerative changes noted in the thoracic spine. --Neurosurgery following, appreciate assistance --pt is continued on Decadron 2mg  IV q8hrs --Maintain c-collar --PT/OT following --Tylenol, oxycodone, continue with Dilaudid as needed for pain control --Neurosurgery, Dr. Ronnald Ramp is planning for surgical intervention 03/05/2020  Essential hypertension --Continue home labetalol, lisinopril as tolerated --Increase hydralazine to 25 mg twice daily on 03/02/2020 --Continue statin --Holding aspirin as above for possible surgical invention early next week. --Continue to monitor blood pressure closely and adjust as needed  Type 2 diabetes mellitus Hemoglobin A1c 7.1, well controlled. --Hold home Metformin, glipizide, [place] --Currently on Lantus 40 units subcutaneously daily with NovoLog 8 units TID AC --Glucose trends improved, on average in the 120-130 range --Moderate dose insulin sliding scale for coverage while inpatient --Cont to check CBGs every 4 hours AC/at bedtime  Hyperlipidemia --Atorvastatin 40 mg p.o. daily as tolerated  Diabetic neuropathy --Continue Cymbalta and gabapentin  Chronic diastolic congestive heart failure, compensated --Continue labetalol, lisinopril --continue with Furosemide 40 mg p.o. daily --Continue statin as tolerated --Strict I's and O's and daily weights  Polysubstance abuse with cocaine, EtOH, tobacco --Review of UDS, positive cocaine on 11/08/2019, 01/23/2016, 10/03/2015.  Counseled regarding cessation with patient verbalizing understanding.  DVT prophylaxis: SCD's Code Status: Full Family Communication: Pt in room, family not at bedside  Status is: Inpatient  Remains inpatient appropriate because:Ongoing  diagnostic testing needed not appropriate for outpatient work up   Dispo:  Patient From: Home  Planned Disposition: Inpatient Rehab  Expected discharge date: 03/08/20  Medically stable for discharge: No  Consultants:   Neurosurgery  Procedures:     Antimicrobials: Anti-infectives (From admission, onward)   None      Subjective: No complaints this morning.  Patient is in good spirits and eager to have surgery tomorrow  Objective: Vitals:   03/04/20 0145 03/04/20 0444 03/04/20 0815 03/04/20 1309  BP:  (!) 154/73 138/76 124/64  Pulse:  60 62 68  Resp:  20  16  Temp:  98.6 F (37 C)  98.8 F (37.1 C)  TempSrc:  Oral  Oral  SpO2:  97% 98% 100%  Weight: 105.4 kg     Height:        Intake/Output Summary (Last 24 hours) at 03/04/2020 1618 Last data filed at 03/04/2020 1500 Gross per 24 hour  Intake 480 ml  Output 825 ml  Net -345 ml   Filed Weights   03/02/20 0442 03/03/20 0547 03/04/20 0145  Weight: 106.6 kg 107.3 kg 105.4 kg    Examination: General exam: Awake, laying in bed, in nad, neck brace in place Respiratory system: Normal respiratory effort, no wheezing Cardiovascular system: regular rate, s1, s2 Gastrointestinal system: Soft, nondistended, positive BS Central nervous system: CN2-12 grossly intact, strength intact Extremities: Perfused, no clubbing Skin: Normal skin turgor, no notable skin lesions seen Psychiatry: Mood normal // no visual hallucinations   Data Reviewed: I have personally reviewed following labs and imaging studies  CBC: Recent Labs  Lab 02/26/20 1822 02/27/20 0705 02/28/20 0624 03/02/20 0621  WBC 9.6 8.4 17.7* 14.3*  NEUTROABS 6.0  --   --   --   HGB 13.7 13.2 13.5 13.7  HCT 43.8 41.3 41.3 42.3  MCV 86.9 85.5 85.2 83.9  PLT 252 247 269 096   Basic Metabolic Panel: Recent Labs  Lab 02/26/20 1822 02/27/20 0705 02/28/20 0624 03/02/20 0621 03/03/20 0701  NA 138 137 134* 138 138  K 4.5 5.0 4.2 4.9 4.3  CL  103 105 104 105 104  CO2 25 21* 19* 23 23  GLUCOSE 209* 225* 238* 206* 175*  BUN 26* 31* 30* 38* 33*  CREATININE 1.46* 1.59* 1.12 1.01 0.94  CALCIUM 9.4 9.2 9.2 9.3 9.1  MG 2.1  --  2.1 2.0  --   PHOS 4.0  --   --   --   --    GFR: Estimated Creatinine Clearance: 108.4 mL/min (by C-G formula based on SCr of 0.94 mg/dL). Liver Function Tests: Recent Labs  Lab 02/26/20 1822 02/27/20 0705  AST 16 15  ALT 14 14  ALKPHOS 61 60  BILITOT 1.0 0.6  PROT 6.7 6.9  ALBUMIN 3.7 3.3*   No results for input(s): LIPASE, AMYLASE in the last 168 hours. No results for input(s): AMMONIA in the last 168 hours. Coagulation Profile: No results for input(s): INR, PROTIME in the last 168 hours. Cardiac Enzymes: No results for input(s): CKTOTAL, CKMB, CKMBINDEX, TROPONINI in the last 168 hours. BNP (last 3 results) No results for input(s): PROBNP in the last 8760 hours. HbA1C: No results for input(s): HGBA1C in the last 72 hours. CBG: Recent Labs  Lab 03/03/20 1107 03/03/20 1607 03/03/20 2156 03/04/20 0617 03/04/20 1148  GLUCAP 218* 122* 126* 141* 234*   Lipid Profile: No results for input(s): CHOL, HDL, LDLCALC, TRIG, CHOLHDL, LDLDIRECT in the last 72 hours. Thyroid Function Tests: No results for input(s): TSH, T4TOTAL, FREET4, T3FREE, THYROIDAB in the last 72 hours. Anemia Panel: No results for input(s): VITAMINB12, FOLATE, FERRITIN, TIBC, IRON, RETICCTPCT in the last 72 hours. Sepsis  Labs: No results for input(s): PROCALCITON, LATICACIDVEN in the last 168 hours.  Recent Results (from the past 240 hour(s))  SARS Coronavirus 2 by RT PCR (hospital order, performed in [facility] hospital lab) Nasopharyngeal Nasopharyngeal Swab     Status: None   Collection Time: 02/26/20  6:50 PM   Specimen: Nasopharyngeal Swab  Result Value Ref Range Status   SARS Coronavirus 2 NEGATIVE NEGATIVE Final    Comment: (NOTE) SARS-CoV-2 target nucleic acids are NOT DETECTED.  The SARS-CoV-2 RNA is  generally detectable in upper and lower respiratory specimens during the acute phase of infection. The lowest concentration of SARS-CoV-2 viral copies this assay can detect is 250 copies / mL. A negative result does not preclude SARS-CoV-2 infection and should not be used as the sole basis for treatment or other patient management decisions.  A negative result may occur with improper specimen collection / handling, submission of specimen other than nasopharyngeal swab, presence of viral mutation(s) within the areas targeted by this assay, and inadequate number of viral copies (<250 copies / mL). A negative result must be combined with clinical observations, patient history, and epidemiological information.  Fact Sheet for Patients:   StrictlyIdeas.no  Fact Sheet for Healthcare Providers: BankingDealers.co.za  This test is not yet approved or  cleared by the [place] FDA and has been authorized for detection and/or diagnosis of SARS-CoV-2 by FDA under an Emergency Use Authorization (EUA).  This EUA will remain in effect (meaning this test can be used) for the duration of the COVID-19 declaration under Section 564(b)(1) of the Act, 21 U.S.C. section 360bbb-3(b)(1), unless the authorization is terminated or revoked sooner.  Performed at [facility], [place] [address], [place], [place] [address]      Radiology Studies: No results found.  Scheduled Meds: . atorvastatin  40 mg Oral Daily  . dexamethasone (DECADRON) injection  2 mg Intravenous Q8H  . DULoxetine  60 mg Oral Daily  . furosemide  40 mg Oral Daily  . gabapentin  300 mg Oral BID  . hydrALAZINE  25 mg Oral BID  . insulin aspart  0-15 Units Subcutaneous TID WC  . insulin aspart  0-5 Units Subcutaneous QHS  . insulin aspart  8 Units Subcutaneous TID WC  . insulin glargine  40 Units Subcutaneous Daily  . labetalol  100 mg Oral BID  . lisinopril  40 mg Oral Daily    Continuous Infusions:   LOS: 7 days   Marylu Lund, MD [facility] Pager On Amion  If 7PM-7AM, please contact night-coverage 03/04/2020, 4:18 PM

## 2020-03-04 NOTE — Progress Notes (Signed)
Doing ok, no change in extremity strength. Still having spasms in his legs that he says are a little less frequent. Continue collar. Planning for posterior decompression and fusion tomorrow afternoon. We discussed the risks and benefits associated with the surgery. He understood and agrees to move forward. NPO after midnight and dc all blood thinners.

## 2020-03-04 NOTE — Progress Notes (Signed)
Patient has not made any urine output during 12 hours shift. Bladder scan show 352.  Per Dr Wyline Copas, in and out cath if patient retention >300.  In and out cath completed. Drained 600 ml

## 2020-03-04 NOTE — Progress Notes (Signed)
Physical Therapy Treatment/D/C Patient Details Name: [name] MRN: [id number] DOB: 01/20/1961 Today's Date: 03/04/2020    History of Present Illness Pt is 59 yo male who presented to ED with worsening numbness and weakness in bil UE and LE (reports chronic but worsened over past 3-4 weeks).  Pt's MRI revealed severe spinal stenosis C3-4, C4-5 with cord compression.  Neurosurgery has been consulted and recommended continuing PT/OT, IV steroids a few days, possible sx next week, and continuing to wear cervical collar.    PT Comments    Pt admitted with above diagnosis. Pt was able to ambulate with min to mod assist in [place] plus and incr distance today. Knees still buckling but [name] plus was helpful to block knees and for pt to maintain standing and walking ability.  Pt going for surgery tomorrow therefore will need new order to resume PT after surgery.   Pt currently with functional limitations due to balance and endurance deficits. Will sign off and need reorder.    Follow Up Recommendations  CIR (This should still be the recommendation but will reassess after surgery)     Equipment Recommendations  None recommended by PT (has DME)    Recommendations for Other Services Rehab consult     Precautions / Restrictions Precautions Precautions: Other (comment) Precaution Comments: cervical Required Braces or Orthoses: Cervical Brace Cervical Brace: Hard collar;At all times Restrictions Weight Bearing Restrictions: No    Mobility  Bed Mobility Overal bed mobility: Needs Assistance Bed Mobility: Supine to Sit;Sit to Supine     Supine to sit: Min assist;Mod assist     General bed mobility comments: use of bed rail and assist to lift trunk  Transfers Overall transfer level: Needs assistance Equipment used: Ambulation equipment used Transfers: Sit to/from Stand Sit to Stand: Min assist;+2 safety/equipment;Mod assist         General transfer comment: bed height elevated,  [name] in place with foot plate removed.  Ambulation/Gait Ambulation/Gait assistance: Min assist;+2 safety/equipment;Mod assist Gait Distance (Feet): 25 Feet Assistive device:  ([name]) Gait Pattern/deviations: Step-to pattern;Decreased stride length;Shuffle;Ataxic;Narrow base of support;Drifts right/left Gait velocity: decreased Gait velocity interpretation: <1.31 ft/sec, indicative of household ambulator General Gait Details: Pt was able to progress steps in [place] plus walking to door and peeked into hallway.  Left the knee pad in place to prevent buckling.  Pt took small steps but almost had to due to the [place] plus set up.    Stairs             Wheelchair Mobility    Modified Rankin (Stroke Patients Only)       Balance Overall balance assessment: Needs assistance Sitting-balance support: No upper extremity supported Sitting balance-Leahy Scale: Good     Standing balance support: Bilateral upper extremity supported Standing balance-Leahy Scale: Poor Standing balance comment: required UE support on [name].                             Cognition Arousal/Alertness: Awake/alert Behavior During Therapy: WFL for tasks assessed/performed Overall Cognitive Status: Impaired/Different from baseline Area of Impairment: Memory                     Memory: Decreased recall of precautions         General Comments: some difficulty with processing certain instruction. educated on cervical precautions but with reduced carryover       Exercises General Exercises - Lower  Extremity Ankle Circles/Pumps: AROM;Both;10 reps;Seated Long Arc Quad: AROM;Both;10 reps;Seated Hip Flexion/Marching: AROM;Both;10 reps;Seated    General Comments        Pertinent Vitals/Pain Pain Assessment: Faces Faces Pain Scale: Hurts even more Pain Location: generalized, headache Pain Descriptors / Indicators: Aching Pain Intervention(s): Limited activity within  patient's tolerance;Monitored during session;Repositioned    Home Living                      Prior Function            PT Goals (current goals can now be found in the care plan section) Acute Rehab PT Goals Patient Stated Goal: regain strength Progress towards PT goals: Progressing toward goals (Will need new order after surgery)    Frequency    Min 3X/week      PT Plan Current plan remains appropriate    Co-evaluation              AM-PAC PT "6 Clicks" Mobility   Outcome Measure  Help needed turning from your back to your side while in a flat bed without using bedrails?: A Little Help needed moving from lying on your back to sitting on the side of a flat bed without using bedrails?: A Little Help needed moving to and from a bed to a chair (including a wheelchair)?: A Little Help needed standing up from a chair using your arms (e.g., wheelchair or bedside chair)?: A Lot Help needed to walk in hospital room?: A Lot Help needed climbing 3-5 steps with a railing? : Total 6 Click Score: 14    End of Session Equipment Utilized During Treatment: Gait belt;Cervical collar Activity Tolerance: Patient tolerated treatment well;Patient limited by fatigue Patient left: with chair alarm set;in chair;with call bell/phone within reach Nurse Communication: Mobility status;Other (comment);Need for lift equipment (recommended assist of 2 for safety with sara plus) PT Visit Diagnosis: Unsteadiness on feet (R26.81);Muscle weakness (generalized) (M62.81)     Time: 1130-1145 PT Time Calculation (min) (ACUTE ONLY): 15 min  Charges:  $Gait Training: 8-22 mins                     Waverly Tarquinio W,PT [place] Pager:  [phone]  Office:  [place] 03/04/2020, 1:38 PM

## 2020-03-04 NOTE — Progress Notes (Signed)
Physical Therapy Discharge Patient Details Name: [name] MRN: [id number] DOB: [birth year] Today's Date: 03/04/2020 Time: 6381-7711 PT Time Calculation (min) (ACUTE ONLY): 15 min  Patient discharged from PT services secondary to surgery - will need to re-order PT to resume therapy services.  Please see latest therapy progress note for current level of functioning and progress toward goals.    Progress and discharge plan discussed with patient and/or caregiver: Patient/Caregiver agrees with plan  GP     [name] 03/04/2020, 1:41 PM  [name],PT Acute Rehabilitation Services Pager:  [phone]  Office:  [phone]

## 2020-03-05 ENCOUNTER — Encounter (HOSPITAL_COMMUNITY)
Admission: EM | Disposition: A | Discharge status: Rehabilitation Facility | Payer: Self-pay | Source: Home / Self Care | Attending: Internal Medicine

## 2020-03-05 ENCOUNTER — Encounter (HOSPITAL_COMMUNITY): Payer: Self-pay | Admitting: Internal Medicine

## 2020-03-05 ENCOUNTER — Inpatient Hospital Stay (HOSPITAL_COMMUNITY): Payer: Medicare Other | Admitting: Certified Registered Nurse Anesthetist

## 2020-03-05 ENCOUNTER — Inpatient Hospital Stay (HOSPITAL_COMMUNITY): Payer: Medicare Other

## 2020-03-05 DIAGNOSIS — M4802 Spinal stenosis, cervical region: Secondary | ICD-10-CM | POA: Diagnosis present

## 2020-03-05 DIAGNOSIS — R531 Weakness: Secondary | ICD-10-CM | POA: Diagnosis not present

## 2020-03-05 DIAGNOSIS — I10 Essential (primary) hypertension: Secondary | ICD-10-CM | POA: Diagnosis not present

## 2020-03-05 HISTORY — PX: POSTERIOR CERVICAL FUSION/FORAMINOTOMY: SHX5038

## 2020-03-05 LAB — GLUCOSE, CAPILLARY
Glucose-Capillary: 163 mg/dL — ABNORMAL HIGH (ref 70–99)
Glucose-Capillary: 182 mg/dL — ABNORMAL HIGH (ref 70–99)
Glucose-Capillary: 120 mg/dL — ABNORMAL HIGH (ref 70–99)
Glucose-Capillary: 120 mg/dL — ABNORMAL HIGH (ref 70–99)
Glucose-Capillary: 179 mg/dL — ABNORMAL HIGH (ref 70–99)

## 2020-03-05 LAB — TYPE AND SCREEN
ABO/RH(D): AB POS
Antibody Screen: NEGATIVE

## 2020-03-05 SURGERY — POSTERIOR CERVICAL FUSION/FORAMINOTOMY LEVEL 3
Anesthesia: General | Site: Spine Cervical

## 2020-03-05 MED ORDER — MENTHOL 3 MG MT LOZG: 1.0000 | LOZENGE | OROMUCOSAL | Status: DC | PRN

## 2020-03-05 MED ORDER — PHENYLEPHRINE HCL-NACL 10-0.9 MG/250ML-% IV SOLN
INTRAVENOUS | Status: DC | PRN
  Administered 2020-03-05: 50 ug/min via INTRAVENOUS

## 2020-03-05 MED ORDER — ACETAMINOPHEN 650 MG RE SUPP: 650.0000 mg | RECTAL | Status: DC | PRN

## 2020-03-05 MED ORDER — MORPHINE SULFATE (PF) 2 MG/ML IV SOLN
2.0000 mg | INTRAVENOUS | Status: DC | PRN
  Administered 2020-03-05: 2 mg via INTRAVENOUS
  Filled 2020-03-05: qty 1

## 2020-03-05 MED ORDER — SUCCINYLCHOLINE CHLORIDE 200 MG/10ML IV SOSY
PREFILLED_SYRINGE | INTRAVENOUS | Status: DC | PRN
  Administered 2020-03-05: 160 mg via INTRAVENOUS

## 2020-03-05 MED ORDER — MIDAZOLAM HCL 5 MG/5ML IJ SOLN
INTRAMUSCULAR | Status: DC | PRN
  Administered 2020-03-05: 2 mg via INTRAVENOUS

## 2020-03-05 MED ORDER — OXYCODONE HCL 5 MG PO TABS
10.0000 mg | ORAL_TABLET | ORAL | Status: DC | PRN
  Administered 2020-03-06 – 2020-03-11 (×12): 10 mg via ORAL
  Filled 2020-03-05 (×12): qty 2

## 2020-03-05 MED ORDER — ALBUMIN HUMAN 5 % IV SOLN: INTRAVENOUS | Status: DC | PRN

## 2020-03-05 MED ORDER — ESMOLOL HCL 100 MG/10ML IV SOLN
INTRAVENOUS | Status: AC
  Filled 2020-03-05: qty 10

## 2020-03-05 MED ORDER — FENTANYL CITRATE (PF) 100 MCG/2ML IJ SOLN
25.0000 ug | INTRAMUSCULAR | Status: DC | PRN
  Administered 2020-03-05 (×3): 50 ug via INTRAVENOUS

## 2020-03-05 MED ORDER — KETAMINE HCL 50 MG/5ML IJ SOSY
PREFILLED_SYRINGE | INTRAMUSCULAR | Status: AC
  Filled 2020-03-05: qty 5

## 2020-03-05 MED ORDER — THROMBIN 20000 UNITS EX SOLR
CUTANEOUS | Status: AC
  Filled 2020-03-05: qty 20000

## 2020-03-05 MED ORDER — DEXAMETHASONE SODIUM PHOSPHATE 10 MG/ML IJ SOLN
INTRAMUSCULAR | Status: DC | PRN
  Administered 2020-03-05: 10 mg via INTRAVENOUS

## 2020-03-05 MED ORDER — FENTANYL CITRATE (PF) 100 MCG/2ML IJ SOLN
25.0000 ug | INTRAMUSCULAR | Status: DC | PRN
  Administered 2020-03-05 (×2): 50 ug via INTRAVENOUS

## 2020-03-05 MED ORDER — VASOPRESSIN 20 UNIT/ML IV SOLN
INTRAVENOUS | Status: DC | PRN
Start: 2020-03-05 — End: 2020-03-05
  Administered 2020-03-05: 1 [IU] via INTRAVENOUS

## 2020-03-05 MED ORDER — ORAL CARE MOUTH RINSE: 15.0000 mL | Freq: Once | OROMUCOSAL | Status: AC

## 2020-03-05 MED ORDER — PHENYLEPHRINE HCL (PRESSORS) 10 MG/ML IV SOLN
INTRAVENOUS | Status: AC
  Filled 2020-03-05: qty 1

## 2020-03-05 MED ORDER — ACETAMINOPHEN 325 MG PO TABS: 650.0000 mg | ORAL_TABLET | ORAL | Status: DC | PRN

## 2020-03-05 MED ORDER — ONDANSETRON HCL 4 MG PO TABS: 4.0000 mg | ORAL_TABLET | Freq: Four times a day (QID) | ORAL | Status: DC | PRN

## 2020-03-05 MED ORDER — POTASSIUM CHLORIDE IN NACL 20-0.9 MEQ/L-% IV SOLN: INTRAVENOUS | Status: DC

## 2020-03-05 MED ORDER — CEFAZOLIN SODIUM-DEXTROSE 2-3 GM-%(50ML) IV SOLR
INTRAVENOUS | Status: DC | PRN
  Administered 2020-03-05: 2 g via INTRAVENOUS

## 2020-03-05 MED ORDER — PHENOL 1.4 % MT LIQD: 1.0000 | OROMUCOSAL | Status: DC | PRN

## 2020-03-05 MED ORDER — DEXAMETHASONE SODIUM PHOSPHATE 4 MG/ML IJ SOLN
4.0000 mg | Freq: Four times a day (QID) | INTRAMUSCULAR | Status: DC
  Administered 2020-03-08 (×2): 4 mg via INTRAVENOUS
  Filled 2020-03-05 (×2): qty 1

## 2020-03-05 MED ORDER — FENTANYL CITRATE (PF) 100 MCG/2ML IJ SOLN
INTRAMUSCULAR | Status: AC
  Filled 2020-03-05: qty 2

## 2020-03-05 MED ORDER — BACITRACIN ZINC 500 UNIT/GM EX OINT
TOPICAL_OINTMENT | CUTANEOUS | Status: AC
  Filled 2020-03-05: qty 28.35

## 2020-03-05 MED ORDER — SODIUM CHLORIDE 0.9% FLUSH: 3.0000 mL | INTRAVENOUS | Status: DC | PRN

## 2020-03-05 MED ORDER — SUGAMMADEX SODIUM 200 MG/2ML IV SOLN
INTRAVENOUS | Status: DC | PRN
  Administered 2020-03-05: 300 mg via INTRAVENOUS

## 2020-03-05 MED ORDER — ACETAMINOPHEN 500 MG PO TABS
ORAL_TABLET | ORAL | Status: AC
  Filled 2020-03-05: qty 2

## 2020-03-05 MED ORDER — OXYCODONE HCL 5 MG/5ML PO SOLN: 5.0000 mg | Freq: Once | ORAL | Status: AC | PRN

## 2020-03-05 MED ORDER — VASOPRESSIN 20 UNIT/ML IV SOLN
INTRAVENOUS | Status: AC
  Filled 2020-03-05: qty 1

## 2020-03-05 MED ORDER — SUGAMMADEX SODIUM 500 MG/5ML IV SOLN
INTRAVENOUS | Status: AC
  Filled 2020-03-05: qty 5

## 2020-03-05 MED ORDER — LIDOCAINE 2% (20 MG/ML) 5 ML SYRINGE
INTRAMUSCULAR | Status: DC | PRN
  Administered 2020-03-05: 80 mg via INTRAVENOUS

## 2020-03-05 MED ORDER — SODIUM CHLORIDE 0.9 % IV SOLN: INTRAVENOUS | Status: DC | PRN

## 2020-03-05 MED ORDER — PROPOFOL 10 MG/ML IV BOLUS
INTRAVENOUS | Status: DC | PRN
  Administered 2020-03-05: 160 mg via INTRAVENOUS

## 2020-03-05 MED ORDER — FENTANYL CITRATE (PF) 250 MCG/5ML IJ SOLN
INTRAMUSCULAR | Status: AC
  Filled 2020-03-05: qty 5

## 2020-03-05 MED ORDER — ACETAMINOPHEN 10 MG/ML IV SOLN: 1000.0000 mg | Freq: Once | INTRAVENOUS | Status: DC | PRN

## 2020-03-05 MED ORDER — THROMBIN 5000 UNITS EX SOLR
CUTANEOUS | Status: AC
  Filled 2020-03-05: qty 5000

## 2020-03-05 MED ORDER — CHLORHEXIDINE GLUCONATE 0.12 % MT SOLN
15.0000 mL | Freq: Once | OROMUCOSAL | Status: AC
  Administered 2020-03-05: 15 mL via OROMUCOSAL
  Filled 2020-03-05: qty 15

## 2020-03-05 MED ORDER — BUPIVACAINE HCL (PF) 0.25 % IJ SOLN
INTRAMUSCULAR | Status: AC
  Filled 2020-03-05: qty 30

## 2020-03-05 MED ORDER — VANCOMYCIN HCL 1000 MG IV SOLR
INTRAVENOUS | Status: AC
  Filled 2020-03-05: qty 1000

## 2020-03-05 MED ORDER — THROMBIN 20000 UNITS EX SOLR: CUTANEOUS | Status: DC | PRN

## 2020-03-05 MED ORDER — METHOCARBAMOL 500 MG PO TABS
500.0000 mg | ORAL_TABLET | Freq: Four times a day (QID) | ORAL | Status: DC | PRN
  Administered 2020-03-05 – 2020-03-06 (×2): 500 mg via ORAL
  Filled 2020-03-05: qty 1

## 2020-03-05 MED ORDER — ONDANSETRON HCL 4 MG/2ML IJ SOLN: 4.0000 mg | Freq: Four times a day (QID) | INTRAMUSCULAR | Status: DC | PRN

## 2020-03-05 MED ORDER — ONDANSETRON HCL 4 MG/2ML IJ SOLN
INTRAMUSCULAR | Status: DC | PRN
  Administered 2020-03-05: 4 mg via INTRAVENOUS

## 2020-03-05 MED ORDER — OXYCODONE HCL 5 MG PO TABS
5.0000 mg | ORAL_TABLET | Freq: Once | ORAL | Status: AC | PRN
  Administered 2020-03-05: 5 mg via ORAL

## 2020-03-05 MED ORDER — MIDAZOLAM HCL 2 MG/2ML IJ SOLN
INTRAMUSCULAR | Status: AC
  Filled 2020-03-05: qty 2

## 2020-03-05 MED ORDER — ROCURONIUM BROMIDE 10 MG/ML (PF) SYRINGE
PREFILLED_SYRINGE | INTRAVENOUS | Status: DC | PRN
  Administered 2020-03-05: 60 mg via INTRAVENOUS
  Administered 2020-03-05 (×2): 20 mg via INTRAVENOUS

## 2020-03-05 MED ORDER — SODIUM CHLORIDE 0.9% FLUSH
3.0000 mL | Freq: Two times a day (BID) | INTRAVENOUS | Status: DC
  Administered 2020-03-05 – 2020-03-11 (×12): 3 mL via INTRAVENOUS

## 2020-03-05 MED ORDER — ACETAMINOPHEN 500 MG PO TABS
1000.0000 mg | ORAL_TABLET | Freq: Once | ORAL | Status: AC | PRN
  Administered 2020-03-05: 1000 mg via ORAL

## 2020-03-05 MED ORDER — 0.9 % SODIUM CHLORIDE (POUR BTL) OPTIME
TOPICAL | Status: DC | PRN
  Administered 2020-03-05: 1000 mL

## 2020-03-05 MED ORDER — LACTATED RINGERS IV SOLN: INTRAVENOUS | Status: DC

## 2020-03-05 MED ORDER — OXYCODONE HCL 5 MG PO TABS
ORAL_TABLET | ORAL | Status: AC
  Filled 2020-03-05: qty 1

## 2020-03-05 MED ORDER — DEXAMETHASONE 4 MG PO TABS
4.0000 mg | ORAL_TABLET | Freq: Four times a day (QID) | ORAL | Status: DC
  Administered 2020-03-05 – 2020-03-07 (×8): 4 mg via ORAL
  Filled 2020-03-05 (×8): qty 1

## 2020-03-05 MED ORDER — THROMBIN 5000 UNITS EX SOLR: OROMUCOSAL | Status: DC | PRN

## 2020-03-05 MED ORDER — EPHEDRINE SULFATE 50 MG/ML IJ SOLN
INTRAMUSCULAR | Status: DC | PRN
  Administered 2020-03-05: 15 mg via INTRAVENOUS

## 2020-03-05 MED ORDER — PHENYLEPHRINE 40 MCG/ML (10ML) SYRINGE FOR IV PUSH (FOR BLOOD PRESSURE SUPPORT)
PREFILLED_SYRINGE | INTRAVENOUS | Status: DC | PRN
  Administered 2020-03-05: 120 ug via INTRAVENOUS
  Administered 2020-03-05: 200 ug via INTRAVENOUS
  Administered 2020-03-05: 160 ug via INTRAVENOUS
  Administered 2020-03-05 (×2): 120 ug via INTRAVENOUS

## 2020-03-05 MED ORDER — EPINEPHRINE PF 1 MG/ML IJ SOLN
INTRAMUSCULAR | Status: DC | PRN
  Administered 2020-03-05 (×2): .01 mg via INTRAVENOUS

## 2020-03-05 MED ORDER — SENNA 8.6 MG PO TABS
1.0000 | ORAL_TABLET | Freq: Two times a day (BID) | ORAL | Status: DC
  Administered 2020-03-05 – 2020-03-11 (×12): 8.6 mg via ORAL
  Filled 2020-03-05 (×12): qty 1

## 2020-03-05 MED ORDER — METHOCARBAMOL 1000 MG/10ML IJ SOLN
500.0000 mg | Freq: Four times a day (QID) | INTRAVENOUS | Status: DC | PRN
  Filled 2020-03-05: qty 5

## 2020-03-05 MED ORDER — FENTANYL CITRATE (PF) 100 MCG/2ML IJ SOLN
INTRAMUSCULAR | Status: DC | PRN
  Administered 2020-03-05: 50 ug via INTRAVENOUS
  Administered 2020-03-05: 100 ug via INTRAVENOUS

## 2020-03-05 MED ORDER — SODIUM CHLORIDE 0.9 % IV SOLN
250.0000 mL | INTRAVENOUS | Status: DC
  Administered 2020-03-05: 250 mL via INTRAVENOUS

## 2020-03-05 MED ORDER — CEFAZOLIN SODIUM-DEXTROSE 2-4 GM/100ML-% IV SOLN
2.0000 g | Freq: Three times a day (TID) | INTRAVENOUS | Status: AC
  Administered 2020-03-05 – 2020-03-06 (×2): 2 g via INTRAVENOUS
  Filled 2020-03-05 (×2): qty 100

## 2020-03-05 MED ORDER — ACETAMINOPHEN 160 MG/5ML PO SOLN: 1000.0000 mg | Freq: Once | ORAL | Status: AC | PRN

## 2020-03-05 MED ORDER — ESMOLOL HCL 100 MG/10ML IV SOLN
INTRAVENOUS | Status: DC | PRN
  Administered 2020-03-05: 60 mg via INTRAVENOUS

## 2020-03-05 MED ORDER — METHOCARBAMOL 500 MG PO TABS
ORAL_TABLET | ORAL | Status: AC
  Filled 2020-03-05: qty 1

## 2020-03-05 SURGICAL SUPPLY — 45 items
BAG DECANTER FOR FLEXI CONT (MISCELLANEOUS) ×3 IMPLANT
BIT DRILL INVICTUS SUB 2.1 STR (BIT) ×3 IMPLANT
BUR CARBIDE MATCH 3.0 (BURR) ×3 IMPLANT
CANISTER SUCT 3000ML PPV (MISCELLANEOUS) ×3 IMPLANT
CLOSURE WOUND 1/2 X4 (GAUZE/BANDAGES/DRESSINGS) ×1
DRAPE C-ARM 42X72 X-RAY (DRAPES) ×6 IMPLANT
DRAPE LAPAROTOMY 100X72 PEDS (DRAPES) ×3 IMPLANT
DRSG OPSITE POSTOP 4X6 (GAUZE/BANDAGES/DRESSINGS) ×3 IMPLANT
DURAPREP 26ML APPLICATOR (WOUND CARE) ×3 IMPLANT
ELECT REM PT RETURN 9FT ADLT (ELECTROSURGICAL) ×3
ELECTRODE REM PT RTRN 9FT ADLT (ELECTROSURGICAL) ×1 IMPLANT
EVACUATOR 1/8 PVC DRAIN (DRAIN) ×3 IMPLANT
GAUZE SPONGE 4X4 12PLY STRL (GAUZE/BANDAGES/DRESSINGS) ×3 IMPLANT
GLOVE BIO SURGEON STRL SZ7 (GLOVE) ×6 IMPLANT
GLOVE BIO SURGEON STRL SZ8 (GLOVE) ×6 IMPLANT
GLOVE BIOGEL PI IND STRL 7.0 (GLOVE) ×4 IMPLANT
GLOVE BIOGEL PI IND STRL 7.5 (GLOVE) ×2 IMPLANT
GLOVE BIOGEL PI INDICATOR 7.0 (GLOVE) ×8
GLOVE BIOGEL PI INDICATOR 7.5 (GLOVE) ×4
GLOVE SS N UNI LF 7.0 STRL (GLOVE) ×9 IMPLANT
GOWN STRL REUS W/ TWL LRG LVL3 (GOWN DISPOSABLE) ×2 IMPLANT
GOWN STRL REUS W/ TWL XL LVL3 (GOWN DISPOSABLE) ×2 IMPLANT
GOWN STRL REUS W/TWL LRG LVL3 (GOWN DISPOSABLE) ×6
GOWN STRL REUS W/TWL XL LVL3 (GOWN DISPOSABLE) ×6
HEMOSTAT POWDER KIT SURGIFOAM (HEMOSTASIS) ×3 IMPLANT
KIT BASIN OR (CUSTOM PROCEDURE TRAY) ×3 IMPLANT
KIT TURNOVER KIT B (KITS) ×3 IMPLANT
NEEDLE HYPO 22GX1.5 SAFETY (NEEDLE) ×3 IMPLANT
NEEDLE SPNL 20GX3.5 QUINCKE YW (NEEDLE) ×3 IMPLANT
NS IRRIG 1000ML POUR BTL (IV SOLUTION) ×3 IMPLANT
PACK LAMINECTOMY NEURO (CUSTOM PROCEDURE TRAY) ×3 IMPLANT
PIN MAYFIELD SKULL DISP (PIN) ×3 IMPLANT
PUTTY DBM 5CC ×3 IMPLANT
ROD LORD TI 3.5 X 60 (Rod) ×6 IMPLANT
SCREW POLYAXIAL 3.5 X 14 (Screw) ×24 IMPLANT
SCREW SET ATEC (Screw) ×24 IMPLANT
SPONGE SURGIFOAM ABS GEL 100 (HEMOSTASIS) ×3 IMPLANT
STRIP CLOSURE SKIN 1/2X4 (GAUZE/BANDAGES/DRESSINGS) ×2 IMPLANT
SUT VIC AB 0 CT1 18XCR BRD8 (SUTURE) ×2 IMPLANT
SUT VIC AB 0 CT1 8-18 (SUTURE) ×6
SUT VIC AB 2-0 CP2 18 (SUTURE) ×3 IMPLANT
SUT VIC AB 3-0 SH 8-18 (SUTURE) ×3 IMPLANT
TOWEL GREEN STERILE (TOWEL DISPOSABLE) ×3 IMPLANT
TOWEL GREEN STERILE FF (TOWEL DISPOSABLE) ×3 IMPLANT
WATER STERILE IRR 1000ML POUR (IV SOLUTION) ×3 IMPLANT

## 2020-03-05 NOTE — Op Note (Signed)
03/05/2020  6:23 PM  PATIENT:  [name]  59 y.o. 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  DeCompressive cervical laminectomy and medial facetectomy C3-4 C4-5, 2.  Posterior cervical arthrodesis C3-C6 utilizing locally harvested morselized autologous bone graft and morselized allograft, 3.  Lateral mass fixation C3-C6 inclusive utilizing Alphatec lateral mass screws  SURGEON:  Sherley Bounds, MD  ASSISTANTS: Glenford Peers, FNP  ANESTHESIA:   General  EBL: 50 ml  Total I/O In: 1250 [I.V.:500; IV Piggyback:750] Out: 50 [Blood:50]  BLOOD ADMINISTERED: none  DRAINS: Medium Hemovac  SPECIMEN:  none  INDICATION FOR PROCEDURE: This patient presented with difficulty walking and numbness and spasticity and weakness. Imaging showed severe cervical spinal stenosis C3-4 C4-5. The patient tried conservative measures without relief. Pain was debilitating. Recommended decompressive cervical laminectomy and instrumented fusion. Patient understood the risks, benefits, and alternatives and potential outcomes and wished to proceed.  PROCEDURE DETAILS: The patient was brought to the operating room. Generalized endotracheal anesthesia was induced. The patient was affixed a 3 point [place] headrest and rolled into the prone position on chest rolls. All pressure points were padded. The posterior cervical region was cleaned and prepped with DuraPrep and then draped in the usual sterile fashion. 7 cc of local anesthesia was injected and a dorsal midline incision made in the posterior cervical region and carried down to the cervical fascia. The fascia was opened and the paraspinous musculature was taken down to expose C3-C6.  During the exposure we thought we saw exudate coming from the left C4-5 joint.  This was cultured.  We really did not find other evidence of pus during the  operation.  We found mostly significant spondylosis and arthritic changes partially calcified ligamentum flavum.  Intraoperative fluoroscopy confirmed my level and then the dissection was carried out over the lateral facets. I localized the midpoint of each lateral mass and marked a region 1 mm medial to the midpoint of the lateral mass, and then drilled in an upward and outward direction into the safe zone of each lateral mass. I drilled to a depth of 14 mm and then checked my drill hole with a ball probe. I then placed a 14 mm lateral mass screws into the safe zone of each lateral mass until they were 2 fingers tight. I then gently decompressed the central canal with the 1 and 2 mm Kerrison punch from C3 to the top of C6. Medial facetectomies were performed, and foraminotomies were performed at C 4 5. Once the decompression was complete the dura was full and capacious and I could see the spinal cord pulsatile through the dura. I then decorticated the lateral masses and the facet joints and packed them with local autograft and morcellized allograft to perform arthrodesis from C3-C6. I then placed rods into the multiaxial screw heads of the screws and locked these into position with the locking caps and anti-torque device. I then checked the final construct with AP/Lat fluoroscopy. I irrigated with saline solution containing bacitracin. I placed a medium Hemovac drain through separate stab incision, and lined the dura with Gelfoam. After hemostasis was achieved I closed the muscle and the fascia with 0 Vicryl, subcutaneous tissue with 2-0 Vicryl, and the subcuticular tissue with 3-0 Vicryl. The skin was closed with benzoin and Steri-Strips. A sterile dressing was applied, the patient was turned to the supine position and taken out of the headrest, awakened  from general anesthesia and transferred to the recovery room in stable condition. At the end of the procedure all sponge, needle and instrument counts were  correct.   PLAN OF CARE: Admit to inpatient   PATIENT DISPOSITION:  PACU - hemodynamically stable.   Delay start of Pharmacological VTE agent (>24hrs) due to surgical blood loss or risk of bleeding:  yes

## 2020-03-05 NOTE — Telephone Encounter (Signed)
Call placed to patient who provided consent to add [name], [name], to call. Pt shared that he has been using substances and would like assistance with obtaining treatment. LCSW discussed various treatment options and provided supportive resources to Ms. [place] via e-mail. Pt was encouraged to contact LCSW with any additional behavioral health or resource needs.

## 2020-03-05 NOTE — Anesthesia Procedure Notes (Signed)
Procedure Name: Intubation Date/Time: 03/05/2020 4:03 PM Performed by: Imagene Riches, CRNA Pre-anesthesia Checklist: Patient identified, Emergency Drugs available, Suction available and Patient being monitored Patient Re-evaluated:Patient Re-evaluated prior to induction Oxygen Delivery Method: Circle System Utilized Preoxygenation: Pre-oxygenation with 100% oxygen Induction Type: IV induction and Rapid sequence Laryngoscope Size: Glidescope and 4 Grade View: Grade I Tube type: Oral Tube size: 7.5 mm Number of attempts: 1 Airway Equipment and Method: Stylet and Oral airway Placement Confirmation: ETT inserted through vocal cords under direct vision,  positive ETCO2 and breath sounds checked- equal and bilateral Secured at: 23 cm Tube secured with: Tape Dental Injury: Teeth and Oropharynx as per pre-operative assessment

## 2020-03-05 NOTE — Anesthesia Preprocedure Evaluation (Addendum)
Anesthesia Evaluation  Patient identified by MRN, date of birth, ID band Patient awake    Reviewed: Allergy & Precautions, NPO status , Patient's Chart, lab work & pertinent test results, reviewed documented beta blocker date and time   History of Anesthesia Complications Negative for: history of anesthetic complications  Airway Mallampati: III  TM Distance: >3 FB Neck ROM: Limited    Dental  (+) Dental Advisory Given, Edentulous Upper, Poor Dentition   Pulmonary neg shortness of breath, neg sleep apnea, neg recent URI, Patient abstained from smoking., former smoker,    breath sounds clear to auscultation       Cardiovascular hypertension, Pt. on medications and Pt. on home beta blockers (-) angina+ Past MI and +CHF   Rhythm:Regular  - Left ventricle: The cavity size was mildly dilated. There was  mild concentric hypertrophy. Systolic function was vigorous. The  estimated ejection fraction was in the range of 65% to 70%. Wall  motion was normal; there were no regional wall motion  abnormalities. Features are consistent with a pseudonormal left  ventricular filling pattern, with concomitant abnormal relaxation  and increased filling pressure (grade 2 diastolic dysfunction).  - Left atrium: The atrium was mildly dilated.  - Pulmonary arteries: Systolic pressure could not be accurately  estimated.    Neuro/Psych TIA Neuromuscular disease    GI/Hepatic negative GI ROS, Neg liver ROS,   Endo/Other  diabetes  Renal/GU CRFRenal disease     Musculoskeletal   Abdominal   Peds  Hematology negative hematology ROS (+)   Anesthesia Other Findings   Reproductive/Obstetrics                            Anesthesia Physical Anesthesia Plan  ASA: III  Anesthesia Plan: General   Post-op Pain Management:    Induction: Intravenous  PONV Risk Score and Plan: 2 and Ondansetron and  Dexamethasone  Airway Management Planned: Oral ETT and Video Laryngoscope Planned  Additional Equipment: None  Intra-op Plan:   Post-operative Plan: Extubation in OR  Informed Consent: I have reviewed the patients History and Physical, chart, labs and discussed the procedure including the risks, benefits and alternatives for the proposed anesthesia with the patient or authorized representative who has indicated his/her understanding and acceptance.     Dental advisory given  Plan Discussed with: CRNA and Surgeon  Anesthesia Plan Comments:        Anesthesia Quick Evaluation

## 2020-03-05 NOTE — Progress Notes (Signed)
Orthopedic Tech Progress Note Patient Details:  [name] 06/28/1961 [id number] Soft Collar was given to patients nurse for application  Patient ID: [name], male   DOB: 05/17/1961, 59 y.o.   MRN: [id number]   [name] 03/05/2020, 7:34 PM

## 2020-03-05 NOTE — Progress Notes (Signed)
Patient ID: [name], male   DOB: [birth year], 59 y.o.   MRN: [id number] Patient seen and examined in the holding room.  There is no change in his neurologic exam.  He is spastic with mild weakness in all 4 extremities.  He has had questions about the upcoming surgery so I explained the surgery to him once again.  He understands that we are going to do a cervical laminectomy from C3-C5 with an instrumented fusion C3-C6.  He understands the risk of the surgery include but are not limited to bleeding, infection, CSF leak, nerve injury, spinal cord injury, paralysis, weakness, numbness, loss of bowel bladder or sexual function, pseudoarthrosis, need for further surgery, lack of relief of symptoms, worsening symptoms, misplaced hardware, loosening hardware, and anesthesia risk including DVT pneumonia MI and death.  He agrees to proceed.

## 2020-03-05 NOTE — Progress Notes (Signed)
PROGRESS NOTE    [name]  ZOX:[id number] DOB: Dec 18, 1960 DOA: 02/25/2020 PCP: Ladell Pier, MD    Brief Narrative:  59 y.o.malewith medical history significant ofhypertension, hyperlipidemia, diabetes mellitus, TIA, diabetic neuropathy, chronic diastolic CHF, cocaine abuse, tobacco abuse, presents to emergency department with worsening numbness and weakness in bilateral upper and lower extremities since 1 day.  He has diabetic neuropathy however since yesterday he has started feeling worsening of his numbness and tingling and weakness sensation in bilateral upper and lower extremities. Tells me that he has difficulty lifting while picking up his utensils, unable to walk due to weakness. Reports that his symptoms are persistent, no aggravating or relieving factors. Denies association with recent trauma, fever, chills, headache, blurry vision, urinary, bowel incontinence.  He smokes cigarettes every day, denies alcohol use however uses cocaine now and then. His last cocaine use was 2 days ago. He lives alone at home and uses cane and walker for ambulation.  Upon arrival to ED: Patient's vital signs stable, afebrile, initial labs such as CBC, CMP: WNL. MRI cervical: Thoracic spine shows moderate spinal stenosis and cord compression at C3-C4 and C4-C5. Multilevel degenerative changes noted in thoracic spine. EDP talk to neurosurgery.[facility] hospitalist consulted for admission for cervical spinal stenosis with cord compression  Assessment & Plan:   Principal Problem:   Cervical spinal cord compression (HCC) Active Problems:   Hyperlipidemia associated with type 2 diabetes mellitus ([place])   Essential hypertension   Cocaine abuse ([place])   Type 2 diabetes mellitus (HCC)   Peripheral neuropathy   Diastolic congestive heart failure ([place])   Spinal stenosis, cervical region   Cervical stenosis with cord compression Myelopathy, quadriparesis Patient presenting from home  with progressive weakness to bilateral upper and lower extremities.  Acutely worsening over the past 1-2 days.  MR C/T-spine notable for moderate spinal stenosis with cord compression C3-4 and C4-5 with associated cord edema and multilevel degenerative changes noted in the thoracic spine. --Neurosurgery following, appreciate assistance --pt is continued on Decadron 2mg  IV q8hrs --Maintain c-collar --PT/OT following --Tylenol, oxycodone, continue with Dilaudid as needed for pain control --Pt now s/p surgery today by Dr. Ronnald Ramp  Essential hypertension --Continue home labetalol, lisinopril as tolerated --Increase hydralazine to 25 mg twice daily on 03/02/2020 --Continue statin --Holding aspirin as above for possible surgical invention early next week. --Continue to monitor blood pressure closely and adjust as needed  Type 2 diabetes mellitus Hemoglobin A1c 7.1, well controlled. --Hold home Metformin, glipizide, [place] --Currently on Lantus 40 units subcutaneously daily with NovoLog 8 units TID AC --Glucose trends improved, on average in the 120 range. Was low overnight, likely while NPO for surgery --Moderate dose insulin sliding scale for coverage while inpatient --Cont to check CBGs every 4 hours AC/at bedtime  Hyperlipidemia --Atorvastatin 40 mg p.o. daily as tolerated  Diabetic neuropathy --Continue Cymbalta and gabapentin  Chronic diastolic congestive heart failure, compensated --Continue labetalol, lisinopril --continue with Furosemide 40 mg p.o. daily --Continue statin as tolerated --Strict I's and O's and daily weights  Polysubstance abuse with cocaine, EtOH, tobacco --Review of UDS, positive cocaine on 11/08/2019, 01/23/2016, 10/03/2015.  Counseled regarding cessation with patient verbalizing understanding.  DVT prophylaxis: SCD's Code Status: Full Family Communication: Pt in room, family not at bedside  Status is: Inpatient  Remains inpatient appropriate  because:Ongoing diagnostic testing needed not appropriate for outpatient work up   Dispo:  Patient From: Home  Planned Disposition: Inpatient Rehab  Expected discharge date: 03/08/20  Medically stable for  discharge: No        Consultants:   Neurosurgery  Procedures:     Antimicrobials: Anti-infectives (From admission, onward)   None      Subjective: Eager to have surgery. Was seen this AM prior to surgery  Objective: Vitals:   03/04/20 1946 03/05/20 0406 03/05/20 0416 03/05/20 1232  BP: 130/67 130/62  129/71  Pulse: (!) 58 (!) 58  (!) 57  Resp: 15 19  18   Temp: 98.1 F (36.7 C) 98.5 F (36.9 C)  98.2 F (36.8 C)  TempSrc: Oral Oral  Oral  SpO2: 100% 100%  100%  Weight:   105.1 kg   Height:        Intake/Output Summary (Last 24 hours) at 03/05/2020 1617 Last data filed at 03/05/2020 0500 Gross per 24 hour  Intake --  Output 1100 ml  Net -1100 ml   Filed Weights   03/03/20 0547 03/04/20 0145 03/05/20 0416  Weight: 107.3 kg 105.4 kg 105.1 kg    Examination: General exam: Conversant, in no acute distress Respiratory system: normal chest rise, clear, no audible wheezing Cardiovascular system: regular rhythm, s1-s2 Gastrointestinal system: Nondistended, nontender, pos BS Central nervous system: No seizures, no tremors Extremities: No cyanosis, no joint deformities Skin: No rashes, no pallor Psychiatry: Affect normal // no auditory hallucinations   Data Reviewed: I have personally reviewed following labs and imaging studies  CBC: Recent Labs  Lab 02/28/20 0624 03/02/20 0621  WBC 17.7* 14.3*  HGB 13.5 13.7  HCT 41.3 42.3  MCV 85.2 83.9  PLT 269 741   Basic Metabolic Panel: Recent Labs  Lab 02/28/20 0624 03/02/20 0621 03/03/20 0701  NA 134* 138 138  K 4.2 4.9 4.3  CL 104 105 104  CO2 19* 23 23  GLUCOSE 238* 206* 175*  BUN 30* 38* 33*  CREATININE 1.12 1.01 0.94  CALCIUM 9.2 9.3 9.1  MG 2.1 2.0  --    GFR: Estimated Creatinine  Clearance: 108.2 mL/min (by C-G formula based on SCr of 0.94 mg/dL). Liver Function Tests: No results for input(s): AST, ALT, ALKPHOS, BILITOT, PROT, ALBUMIN in the last 168 hours. No results for input(s): LIPASE, AMYLASE in the last 168 hours. No results for input(s): AMMONIA in the last 168 hours. Coagulation Profile: No results for input(s): INR, PROTIME in the last 168 hours. Cardiac Enzymes: No results for input(s): CKTOTAL, CKMB, CKMBINDEX, TROPONINI in the last 168 hours. BNP (last 3 results) No results for input(s): PROBNP in the last 8760 hours. HbA1C: No results for input(s): HGBA1C in the last 72 hours. CBG: Recent Labs  Lab 03/04/20 2144 03/04/20 2246 03/05/20 0618 03/05/20 0953 03/05/20 1434  GLUCAP 59* 79 163* 182* 120*   Lipid Profile: No results for input(s): CHOL, HDL, LDLCALC, TRIG, CHOLHDL, LDLDIRECT in the last 72 hours. Thyroid Function Tests: No results for input(s): TSH, T4TOTAL, FREET4, T3FREE, THYROIDAB in the last 72 hours. Anemia Panel: No results for input(s): VITAMINB12, FOLATE, FERRITIN, TIBC, IRON, RETICCTPCT in the last 72 hours. Sepsis Labs: No results for input(s): PROCALCITON, LATICACIDVEN in the last 168 hours.  Recent Results (from the past 240 hour(s))  SARS Coronavirus 2 by RT PCR (hospital order, performed in [facility] hospital lab) Nasopharyngeal Nasopharyngeal Swab     Status: None   Collection Time: 02/26/20  6:50 PM   Specimen: Nasopharyngeal Swab  Result Value Ref Range Status   SARS Coronavirus 2 NEGATIVE NEGATIVE Final    Comment: (NOTE) SARS-CoV-2 target nucleic acids are NOT DETECTED.  The SARS-CoV-2 RNA is generally detectable in upper and lower respiratory specimens during the acute phase of infection. The lowest concentration of SARS-CoV-2 viral copies this assay can detect is 250 copies / mL. A negative result does not preclude SARS-CoV-2 infection and should not be used as the sole basis for treatment or  other patient management decisions.  A negative result may occur with improper specimen collection / handling, submission of specimen other than nasopharyngeal swab, presence of viral mutation(s) within the areas targeted by this assay, and inadequate number of viral copies (<250 copies / mL). A negative result must be combined with clinical observations, patient history, and epidemiological information.  Fact Sheet for Patients:   StrictlyIdeas.no  Fact Sheet for Healthcare Providers: BankingDealers.co.za  This test is not yet approved or  cleared by the [place] FDA and has been authorized for detection and/or diagnosis of SARS-CoV-2 by FDA under an Emergency Use Authorization (EUA).  This EUA will remain in effect (meaning this test can be used) for the duration of the COVID-19 declaration under Section 564(b)(1) of the Act, 21 U.S.C. section 360bbb-3(b)(1), unless the authorization is terminated or revoked sooner.  Performed at [facility], [place] [address], [place], [place] [address]   Surgical pcr screen     Status: Abnormal   Collection Time: 03/04/20  8:30 PM   Specimen: Nasal Mucosa; Nasal Swab  Result Value Ref Range Status   MRSA, PCR NEGATIVE NEGATIVE Final   Staphylococcus aureus POSITIVE (A) NEGATIVE Final    Comment: (NOTE) The Xpert SA Assay (FDA approved for NASAL specimens in patients 43 years of age and older), is one component of a comprehensive surveillance program. It is not intended to diagnose infection nor to guide or monitor treatment. Performed at [facility], [place] [address] [place], [place] [address]      Radiology Studies: No results found.  Scheduled Meds: . [MAR Hold] atorvastatin  40 mg Oral Daily  . [MAR Hold] Chlorhexidine Gluconate Cloth  6 each Topical Q0600  . [MAR Hold] dexamethasone (DECADRON) injection  2 mg Intravenous Q8H  . [MAR Hold] DULoxetine  60 mg Oral Daily   . [MAR Hold] furosemide  40 mg Oral Daily  . [MAR Hold] gabapentin  300 mg Oral BID  . [MAR Hold] hydrALAZINE  25 mg Oral BID  . [MAR Hold] insulin aspart  0-15 Units Subcutaneous TID WC  . [MAR Hold] insulin aspart  0-5 Units Subcutaneous QHS  . [MAR Hold] insulin aspart  8 Units Subcutaneous TID WC  . [MAR Hold] insulin glargine  40 Units Subcutaneous Daily  . [MAR Hold] labetalol  100 mg Oral BID  . [MAR Hold] lisinopril  40 mg Oral Daily  . [MAR Hold] mupirocin ointment  1 application Nasal BID   Continuous Infusions: . lactated ringers 10 mL/hr at 03/05/20 1522     LOS: 8 days   Marylu Lund, MD [facility] Pager On Amion  If 7PM-7AM, please contact night-coverage 03/05/2020, 4:17 PM

## 2020-03-05 NOTE — Transfer of Care (Signed)
Immediate Anesthesia Transfer of Care Note  Patient: [name]  Procedure(s) Performed: CERVICAL THREE-FOUR, CERVICAL FOUR-FIVE, CERVICAL FIVE-SIX POSTERIOR CERVICAL FUSION/FORAMINOTOMY (N/A Spine Cervical)  Patient Location: PACU  Anesthesia Type:General  Level of Consciousness: drowsy  Airway & Oxygen Therapy: Patient Spontanous Breathing and Patient connected to face mask oxygen  Post-op Assessment: Report given to RN and Post -op Vital signs reviewed and stable  Post vital signs: Reviewed and stable  Last Vitals:  Vitals Value Taken Time  BP    Temp    Pulse    Resp    SpO2      Last Pain:  Vitals:   03/05/20 1232  TempSrc: Oral  PainSc:          Complications: No complications documented.

## 2020-03-06 DIAGNOSIS — G9520 Unspecified cord compression: Secondary | ICD-10-CM | POA: Diagnosis not present

## 2020-03-06 DIAGNOSIS — F1410 Cocaine abuse, uncomplicated: Secondary | ICD-10-CM | POA: Diagnosis not present

## 2020-03-06 DIAGNOSIS — I10 Essential (primary) hypertension: Secondary | ICD-10-CM | POA: Diagnosis not present

## 2020-03-06 LAB — GLUCOSE, CAPILLARY
Glucose-Capillary: 304 mg/dL — ABNORMAL HIGH (ref 70–99)
Glucose-Capillary: 154 mg/dL — ABNORMAL HIGH (ref 70–99)
Glucose-Capillary: 141 mg/dL — ABNORMAL HIGH (ref 70–99)
Glucose-Capillary: 267 mg/dL — ABNORMAL HIGH (ref 70–99)

## 2020-03-06 NOTE — Evaluation (Signed)
Physical Therapy Re-Evaluation Patient Details Name: [name] MRN: [id number] DOB: 1961-01-17 Today's Date: 03/06/2020   History of Present Illness  Pt is 59 yo male who presented to ED with worsening numbness and weakness in bil UE and LE (reports chronic but worsened over past 3-4 weeks).  Pt's MRI revealed severe spinal stenosis C3-4, C4-5 with cord compression.  Neurosurgery has been consulted and recommended continuing PT/OT, IV steroids a few days, possible sx next week, and continuing to wear cervical collar. (8/13) s/p cervical laminectomy and medial facetectomy C3-4 C4-5, and Posterior cervical arthrodesis C3-C6.  Clinical Impression  Patient is s/p above surgery presenting with functional limitations due to the deficits listed below (see PT Problem List). Demonstrates ability to transfer from bed to chair with min assist today. Up to mod assist for bed mobility/transfer to EOB. Reviewed precautions. Eager to improve his function. Anticipate he will do well with CIR to quickly regain functional independence. He does live alone. Patient will benefit from skilled PT to increase their independence and safety with mobility to allow discharge to the venue listed below.       Follow Up Recommendations CIR    Equipment Recommendations  None recommended by PT    Recommendations for Other Services Rehab consult     Precautions / Restrictions Precautions Precautions: Cervical Precaution Booklet Issued: Yes (comment) Precaution Comments: Reviewed Required Braces or Orthoses: Cervical Brace Cervical Brace: Soft collar (OOB) Restrictions Weight Bearing Restrictions: No      Mobility  Bed Mobility Overal bed mobility: Needs Assistance Bed Mobility: Rolling;Sidelying to Sit Rolling: Min assist Sidelying to sit: Mod assist       General bed mobility comments: Educated on log roll technique, pillow to maintain neutral. Min assist rolling towards left with hand on rail for  support. Mod assist for truncal support boosting to seated position.  Transfers Overall transfer level: Needs assistance Equipment used: Rolling walker (2 wheeled) Transfers: Sit to/from [facility] Sit to Stand: Min assist;From elevated surface Stand pivot transfers: Min assist       General transfer comment: Performed with mod assist initially progressed to min assist with bed elevated. Educated on technique. Able to keep hands on RW, pushing primarily with LEs. Pivot to recliner with min assist for RW navigation.  Ambulation/Gait                Stairs            Wheelchair Mobility    Modified Rankin (Stroke Patients Only)       Balance Overall balance assessment: Needs assistance Sitting-balance support: No upper extremity supported Sitting balance-Leahy Scale: Fair     Standing balance support: Single extremity supported Standing balance-Leahy Scale: Poor                               Pertinent Vitals/Pain Pain Assessment: Faces Faces Pain Scale: Hurts even more Pain Location: neck, post op Pain Descriptors / Indicators: Guarding;Aching Pain Intervention(s): Monitored during session;Repositioned    Home Living Family/patient expects to be discharged to:: Private residence Living Arrangements: Alone   Type of Home: Apartment Home Access: Level entry     Home Layout: One level Home Equipment: Cane - single point;Walker - 2 wheels;Bedside commode Additional Comments: aide comes 3 days a week for 2 hours    Prior Function Level of Independence: Needs assistance   Gait / Transfers Assistance Needed: Walker and cane for  gait. Walker when out of home.  ADL's / Homemaking Assistance Needed: reports aides assist with IADLs, showers, and dressing; reports does tolieting but sometimes not able to get to bathroom in time        Hand Dominance   Dominant Hand: Right    Extremity/Trunk Assessment   Upper Extremity  Assessment Upper Extremity Assessment: Defer to OT evaluation RUE Deficits / Details: grossly 3-/5 not formally assessed LUE Deficits / Details: grossly 3-/5 not formally assessed    Lower Extremity Assessment Lower Extremity Assessment: RLE deficits/detail;LLE deficits/detail RLE Deficits / Details: Greater strength distally than proximal. Knee flexion, hip flexion, hip abduction grossly 3-4/5 LLE Deficits / Details: Greater strength distally than proximal. Knee flexion, hip flexion, hip abduction grossly 3-4/5    Cervical / Trunk Assessment Cervical / Trunk Exceptions:  (soft collar in place, difficulty holding upright.)  Communication   Communication: No difficulties  Cognition Arousal/Alertness: Awake/alert Behavior During Therapy: WFL for tasks assessed/performed Overall Cognitive Status: Impaired/Different from baseline Area of Impairment: Memory                     Memory: Decreased recall of precautions;Decreased short-term memory         General Comments: Calling nurses station, forgetting why.      General Comments      Exercises     Assessment/Plan    PT Assessment Patient needs continued PT services  PT Problem List Decreased strength;Decreased mobility;Decreased range of motion;Decreased coordination;Decreased knowledge of precautions;Decreased activity tolerance;Decreased balance;Decreased knowledge of use of DME;Impaired sensation;Decreased cognition;Decreased safety awareness;Obesity       PT Treatment Interventions DME instruction;Therapeutic activities;Gait training;Therapeutic exercise;Patient/family education;Balance training;Functional mobility training;Neuromuscular re-education    PT Goals (Current goals can be found in the Care Plan section)  Acute Rehab PT Goals Patient Stated Goal: regain strength PT Goal Formulation: With patient Time For Goal Achievement: 03/20/20 Potential to Achieve Goals: Good    Frequency Min 4X/week    Barriers to discharge Decreased caregiver support lives alone    Co-evaluation               AM-PAC PT "6 Clicks" Mobility  Outcome Measure Help needed turning from your back to your side while in a flat bed without using bedrails?: A Little Help needed moving from lying on your back to sitting on the side of a flat bed without using bedrails?: A Lot Help needed moving to and from a bed to a chair (including a wheelchair)?: A Little Help needed standing up from a chair using your arms (e.g., wheelchair or bedside chair)?: A Little Help needed to walk in hospital room?: A Lot Help needed climbing 3-5 steps with a railing? : Total 6 Click Score: 14    End of Session Equipment Utilized During Treatment: Gait belt;Cervical collar (soft collar) Activity Tolerance: Patient tolerated treatment well Patient left: in chair;with chair alarm set;with SCD's reapplied Nurse Communication: Mobility status PT Visit Diagnosis: Unsteadiness on feet (R26.81);Other abnormalities of gait and mobility (R26.89);Muscle weakness (generalized) (M62.81);Difficulty in walking, not elsewhere classified (R26.2)    Time: 5638-9373 PT Time Calculation (min) (ACUTE ONLY): 32 min   Charges:   PT Evaluation $PT Re-evaluation: 1 Re-eval PT Treatments $Therapeutic Activity: 8-22 mins        Elayne Snare, PT, DPT  Ellouise Newer 03/06/2020, 9:52 AM

## 2020-03-06 NOTE — Anesthesia Postprocedure Evaluation (Signed)
Anesthesia Post Note  Patient: [name]  Procedure(s) Performed: CERVICAL THREE-FOUR, CERVICAL FOUR-FIVE, CERVICAL FIVE-SIX POSTERIOR CERVICAL FUSION/FORAMINOTOMY (N/A Spine Cervical)     Patient location during evaluation: PACU Anesthesia Type: General Level of consciousness: patient cooperative and awake Pain management: pain level controlled Vital Signs Assessment: post-procedure vital signs reviewed and stable Respiratory status: spontaneous breathing, nonlabored ventilation, respiratory function stable and patient connected to nasal cannula oxygen Cardiovascular status: blood pressure returned to baseline and stable Postop Assessment: no apparent nausea or vomiting Anesthetic complications: no   No complications documented.  Last Vitals:  Vitals:   03/06/20 1618 03/06/20 1948  BP: (!) 144/60 (!) 158/59  Pulse: (!) 59 67  Resp: 18 18  Temp: 36.7 C 36.4 C  SpO2: 100% 98%    Last Pain:  Vitals:   03/06/20 1948  TempSrc: Oral  PainSc:                  [name]

## 2020-03-06 NOTE — Consult Note (Signed)
Physical Medicine and Rehabilitation Consult Reason for Consult: Impaired mobility and ADLs secondary to cervical spinal cord compression Referring Physician: Marylu Lund, MD   HPI: [name] is a 59 y.o. 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 He came to the ED due to worsening weakness. He underwent decompressive cervical laminectomy and medial facetectomy of C3-C4 and C4-C5 and posterior cervical arthrodesis at C3-C6. Physical Medicine & Rehabilitation was consulted to assess candidacy for CIR.   Review of Systems  Constitutional: Negative for chills and fever.  HENT: Negative for hearing loss and tinnitus.   Eyes: Positive for blurred vision.  Respiratory: Negative for cough and hemoptysis.   Cardiovascular: Negative for chest pain and palpitations.  Gastrointestinal: Negative for heartburn and nausea.  Genitourinary: Negative for dysuria and urgency.  Musculoskeletal: Positive for neck pain.  Skin: Negative for itching and rash.  Neurological: Positive for weakness. Negative for dizziness, seizures, loss of consciousness and headaches.  Endo/Heme/Allergies: Negative for environmental allergies. Does not bruise/bleed easily.  Psychiatric/Behavioral: Negative for depression and suicidal ideas.   Past Medical History:  Diagnosis Date   Alcohol abuse    11-15-2017  per pt last alcohol Dec 2018   BPH with obstruction/lower urinary tract symptoms    Chronic arm pain    Chronic pain    arms, leg, back   CKD (chronic kidney disease), stage II    Cocaine abuse ([place])    11-15-2017  per pt last used March 2019   Congenital hydrocephalus, unspecified ([place])    slow progression   Diabetic retinopathy of both eyes (HCC)    Diastolic CHF, chronic ([place]) 07/2017   Gait instability    multifactorial -- slow worsening congenital hydrocephalus, peripheral neuropathy, left C5-6 cord lesion   Hematuria     History of acute pulmonary edema 07/2017   History of spinal cord injury 01/02/2014   pt fell, caused spinal cord contusion at C5-6--  residual left side weakness     History of ST elevation myocardial infarction (STEMI) 12/26/2013   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   History of TIA (transient ischemic attack) and stroke    hx cva 12-20-2010 and  TIA 02-22-2011--- no residual's from cva or tia   Hyperlipidemia    Hypertension    followed by pcp   Left-sided weakness 12/2013   chronic due to spinal cord contusion   Lesion of bladder    Peripheral edema    chronic LUE   Peripheral neuropathy    Type 2 diabetes mellitus ([place])    followed by dr Karle Plumber--  last A1c 6.8 on 11-06-2017   Weak urinary stream    Past Surgical History:  Procedure Laterality Date   CARDIAC CATHETERIZATION  12/01/2008   dr hochrein   abnormal stress myoview:  mild coronary plaque, normal LVF   CARDIAC CATHETERIZATION  04/29/2011   dr [place]   in setting ECG with new ST elevation & severe hypertensive:  nonobstructive atherosclerotic CAD, normal LVF (30% mRCA)    CARDIOVASCULAR STRESS TEST  08/15/2010   Low nuclear study w/ no evidence ishemia/  ef 42% with lateral and apical hypokinesis   CYSTOSCOPY WITH BIOPSY N/A 11/20/2017   Procedure: CYSTOSCOPY WITH BIOPSY AND FULGURATION;  Surgeon: [name], MD;  Location: [facility];  Service: Urology;  Laterality: N/A;   INCISION AND DRAINAGE RIGHT DISTAL MEDIAL THIGH HEMATOMA  04-14-2005   dr Ninfa Linden   left arm skin  graft  1976   injury   LEFT HEART CATHETERIZATION WITH CORONARY ANGIOGRAM Bilateral 12/26/2013   Procedure: LEFT HEART CATHETERIZATION WITH CORONARY ANGIOGRAM;  Surgeon: Burnell Blanks, MD;  Location: [facility] CATH LAB;  Service: Cardiovascular;  Laterality: Bilateral;  STEMI, in setting cocaine/ alcohol :  mid disease in the RCA (20%), moderate disease in the small caliber intermediate branch (distal 50-60%),  normal LVSF (ef 55-60%)   TOOTH EXTRACTION N/A 05/16/2018   Procedure: DENTAL RESTORATION/EXTRACTIONS WITH ALVEO;  Surgeon: Diona Browner, DDS;  Location: [facility];  Service: Oral Surgery;  Laterality: N/A;   TRANSTHORACIC ECHOCARDIOGRAM  08/05/2017   moderate LVH,  ef 55%,  grade 1 diastolic dysfunction/  mild LAE/  trivial Tr   Family History  Problem Relation Age of Onset   Hypertension Mother    Diabetes Mother    Cancer Mother        breast cancer    Hypertension Father    Heart disease Father    Stroke Father    Hypertension Sister    Social History:  reports that he quit smoking about 2 years ago. His smoking use included cigarettes. He quit after 45.00 years of use. He has never used smokeless tobacco. He reports previous alcohol use. He reports previous drug use. Drugs: "Crack" cocaine and Cocaine. Allergies: No Known Allergies Medications Prior to Admission  Medication Sig Dispense Refill   acetaminophen (TYLENOL) 500 MG tablet TAKE ONE TABLET BY MOUTH TWICE A DAY AS NEEDED (Patient taking differently: Take 500 mg by mouth 2 (two) times daily as needed. ) 60 tablet 0   ASPIR-LOW 81 MG EC tablet TAKE 1 TABLET (81 MG TOTAL) BY MOUTH DAILY. (Patient taking differently: Take 81 mg by mouth daily. ) 30 tablet 3   atorvastatin (LIPITOR) 40 MG tablet TAKE 1.5 TABLETS BY MOUTH DAILY (Patient taking differently: Take 40 mg by mouth daily. ) 45 tablet 2   blood glucose meter kit and supplies KIT Dispense based on patient and insurance preference. Use once daily as directed. (E11.9) (Patient taking differently: Inject 1 each into the skin See admin instructions. Dispense based on patient and insurance preference. Use once daily as directed. (E11.9)) 1 each 0   Blood Glucose Monitoring Suppl (ACCU-CHEK GUIDE ME) w/Device KIT 1 kit by Does not apply route 3 (three) times daily. Use as instructed to check blood sugar 3 times daily. E11.14 (Patient taking  differently: 1 kit by Does not apply route See admin instructions. Use as instructed to check blood sugar 3 times daily. E11.14) 1 kit 0   dapagliflozin propanediol (FARXIGA) 10 MG TABS tablet Take 1 tablet (10 mg total) by mouth daily before breakfast. 30 tablet 6   DULoxetine (CYMBALTA) 60 MG capsule TAKE ONE CAPSULE BY MOUTH DAILY FOR LEG AND BACK PAINS (Patient taking differently: Take 60 mg by mouth daily. ) 30 capsule 1   furosemide (LASIX) 40 MG tablet TAKE ONE TABLET BY MOUTH DAILY . TAKE EXTRA TABLET FOR TWO DAYS AS NEEDED FOR INCREASE LOWER EXTREMITY SWELLING (Patient taking differently: Take 40 mg by mouth See admin instructions. TAKE ONE TABLET BY MOUTH DAILY . TAKE EXTRA TABLET FOR TWO DAYS AS NEEDED FOR INCREASE LOWER EXTREMITY SWELLING) 40 tablet 2   gabapentin (NEURONTIN) 300 MG capsule Take 1 capsule (300 mg total) by mouth 2 (two) times daily. 60 capsule 6   glipiZIDE (GLUCOTROL XL) 10 MG 24 hr tablet Take 1 tablet (10 mg total) by mouth daily with breakfast.  30 tablet 6   glucose blood (ACCU-CHEK GUIDE) test strip Use as instructed to check blood sugar 3 times daily. E11.14 (Patient taking differently: 1 each by Other route See admin instructions. Use as instructed to check blood sugar 3 times daily. E11.14) 100 each 11   hydrALAZINE (APRESOLINE) 10 MG tablet TAKE ONE TABLET BY MOUTH TWICE A DAY (Patient taking differently: Take 10 mg by mouth in the morning and at bedtime. ) 60 tablet 2   labetalol (NORMODYNE) 100 MG tablet TAKE ONE TABLET BY MOUTH TWICE A DAY (Patient taking differently: Take 100 mg by mouth 2 (two) times daily. ) 60 tablet 2   Lancets Misc. (ACCU-CHEK SOFTCLIX LANCET DEV) KIT Use as directed (Patient taking differently: 1 each by Other route as directed. ) 1 kit 0   Lancets MISC Use as instructed to check blood sugar 3 times daily. E11.14 (Patient taking differently: 1 each by Other route See admin instructions. Use as instructed to check blood sugar 3 times  daily. E11.14) 100 each 11   lisinopril (ZESTRIL) 40 MG tablet Take 1 tablet (40 mg total) by mouth daily. 30 tablet 6   loratadine (CLARITIN) 10 MG tablet TAKE ONE TABLET BY MOUTH DAILY AS NEEDED FOR ALLERGIES (Patient taking differently: Take 10 mg by mouth daily as needed for allergies. ) 30 tablet 5   metFORMIN (GLUCOPHAGE) 1000 MG tablet TAKE ONE TABLET BY MOUTH TWICE A DAY WITH A MEAL. (Patient taking differently: Take 1,000 mg by mouth 2 (two) times daily with a meal. ) 180 tablet 1   vitamin B-12 (CYANOCOBALAMIN) 500 MCG tablet Take 1 tablet (500 mcg total) by mouth daily. 100 tablet 1    Home: Home Living Family/patient expects to be discharged to:: Private residence Living Arrangements: Alone Available Help at Discharge: Family, Personal care attendant Type of Home: Apartment Home Access: Level entry Home Layout: One level Bathroom Shower/Tub: Chiropodist: Standard Home Equipment: Cane - single point, Environmental consultant - 2 wheels, Bedside commode Additional Comments: aide comes 3 days a week for 2 hours  Functional History: Prior Function Level of Independence: Needs assistance Gait / Transfers Assistance Needed: Walker and cane for gait. Walker when out of home. ADL's / Homemaking Assistance Needed: reports aides assist with IADLs, showers, and dressing; reports does tolieting but sometimes not able to get to bathroom in time Comments: Has aide 7 days a week for about 2 hours in am; Pt reports weakness/numbness in extremities (L worse than right) chronically but has worsened over the past few weeks Functional Status:  Mobility: Bed Mobility Overal bed mobility: Needs Assistance Bed Mobility: Rolling, Sidelying to Sit Rolling: Min assist Sidelying to sit: Mod assist Supine to sit: Min assist, Mod assist Sit to supine: Mod assist General bed mobility comments: Educated on log roll technique, pillow to maintain neutral. Min assist rolling towards left with hand  on rail for support. Mod assist for truncal support boosting to seated position. Transfers Overall transfer level: Needs assistance Equipment used: Rolling walker (2 wheeled) Transfer via Lift Equipment: Marketing executive Transfers: Sit to/from Stand, Risk manager Sit to Stand: Min assist, From elevated surface Stand pivot transfers: Min assist General transfer comment: Performed with mod assist initially progressed to min assist with bed elevated. Educated on technique. Able to keep hands on RW, pushing primarily with LEs. Pivot to recliner with min assist for RW navigation. Ambulation/Gait Ambulation/Gait assistance: Min assist, +2 safety/equipment, Mod assist Gait Distance (Feet): 25 Feet Assistive device:  (  [name]) Gait Pattern/deviations: Step-to pattern, Decreased stride length, Shuffle, Ataxic, Narrow base of support, Drifts right/left General Gait Details: Pt was able to progress steps in [place] plus walking to door and peeked into hallway.  Left the knee pad in place to prevent buckling.  Pt took small steps but almost had to due to the [place] plus set up.  Gait velocity: decreased Gait velocity interpretation: <1.31 ft/sec, indicative of household ambulator    ADL: ADL Overall ADL's : Needs assistance/impaired Eating/Feeding: Set up, Sitting Eating/Feeding Details (indicate cue type and reason): requires setup to open containers end of session  Grooming: Wash/dry face, Min guard, Sitting Grooming Details (indicate cue type and reason): pt requiring use of bil hands to perform task given UE weakness  Upper Body Bathing: Sitting, Minimal assistance Lower Body Bathing: Maximal assistance, Sitting/lateral leans, Sit to/from stand Upper Body Dressing : Minimal assistance, Sitting Lower Body Dressing: Maximal assistance, Sit to/from stand, Sitting/lateral leans Toilet Transfer: Minimal assistance, +2 for safety/equipment, BSC, Stand-pivot, Cueing for safety Toilet Transfer Details  (indicate cue type and reason): to recliner, +2 for safety due to LE buckling, decreased eccentric control Toileting- Clothing Manipulation and Hygiene: Maximal assistance, Sit to/from stand Toileting - Clothing Manipulation Details (indicate cue type and reason): pt attempting to use urinal at EOB, supervision for balance  Functional mobility during ADLs: Minimal assistance, +2 for safety/equipment, Rolling walker, Cueing for safety General ADL Comments: pt tolerating EOB activity today including seated ADL and UB/LB AROM  Cognition: Cognition Overall Cognitive Status: Impaired/Different from baseline Orientation Level: Oriented X4 Cognition Arousal/Alertness: Awake/alert Behavior During Therapy: WFL for tasks assessed/performed Overall Cognitive Status: Impaired/Different from baseline Area of Impairment: Memory Memory: Decreased recall of precautions, Decreased short-term memory General Comments: Calling nurses station, forgetting why.  Blood pressure (!) 175/69, pulse 60, temperature 98.2 F (36.8 C), temperature source Oral, resp. rate 16, height _0  (1.905 m), weight 112 kg, SpO2 99 %. Physical Exam   General: Alert and oriented x 3, No apparent distress HEENT: Head is normocephalic, atraumatic, PERRLA, EOMI, sclera anicteric, oral mucosa pink and moist, dentition intact, ext ear canals clear,  Neck: Supple without JVD or lymphadenopathy Heart: Reg rate and rhythm. No murmurs rubs or gallops Chest: CTA bilaterally without wheezes, rales, or rhonchi; no distress Abdomen: Soft, non-tender, non-distended, bowel sounds positive. Extremities: No clubbing, cyanosis, or edema. Pulses are 2+ Skin: Clean and intact without signs of breakdown Neuro: Pt is cognitively appropriate with normal insight, memory, and awareness. Cranial nerves 2-12 are intact. Sensory exam is normal.  Fine motor coordination is intact. No tremors. Motor function is grossly 4/5 in lower extremities and 3/5 in  upper extremities.  Psych: Pt's affect is appropriate. Pt is cooperative   Results for orders placed or performed during the hospital encounter of 02/25/20 (from the past 24 hour(s))  Aerobic Culture (superficial specimen)     Status: None (Preliminary result)   Collection Time: 03/05/20  4:52 PM   Specimen: Abscess  Result Value Ref Range   Specimen Description ABSCESS    Special Requests POSTERIOR CERVICAL    Gram Stain NO WBC SEEN NO ORGANISMS SEEN     Culture      NO GROWTH < 24 HOURS Performed at [facility], [address], [place], [place] [address]    Report Status PENDING   Glucose, capillary     Status: Abnormal   Collection Time: 03/05/20  6:35 PM  Result Value Ref Range   Glucose-Capillary  120 (H) 70 - 99 mg/dL  Glucose, capillary     Status: Abnormal   Collection Time: 03/05/20 10:09 PM  Result Value Ref Range   Glucose-Capillary 179 (H) 70 - 99 mg/dL   Comment 1 Notify RN    Comment 2 Document in Chart   Glucose, capillary     Status: Abnormal   Collection Time: 03/06/20  6:05 AM  Result Value Ref Range   Glucose-Capillary 304 (H) 70 - 99 mg/dL  Glucose, capillary     Status: Abnormal   Collection Time: 03/06/20 12:05 PM  Result Value Ref Range   Glucose-Capillary 154 (H) 70 - 99 mg/dL   DG Cervical Spine 2-3 Views  Result Date: 03/05/2020 CLINICAL DATA:  Surgery, elective. C3-6 posterior fusion. Fluoroscopy time 12 seconds, 3.45 mGy EXAM: CERVICAL SPINE - 2-3 VIEW; DG C-ARM 1-60 MIN COMPARISON:  MRI of the cervical spine 02/26/2020 FINDINGS: Three intraoperative fluoroscopic images of the cervical spine are submitted. A posterior spinal fusion construct is now present at the C3-C6 levels. Superimposition of the shoulders limits evaluation of the C4 and more caudal levels on the lateral view images. Additionally, overlying artifact partially obscures the lower aspect of the right-sided fusion hardware on the PA image. Within these limitations, there is no  unexpected finding. Partially visualized support tubes. IMPRESSION: Three intraoperative fluoroscopic images of the cervical spine as above. Electronically Signed   By: Kellie Simmering DO   On: 03/05/2020 18:13   DG C-Arm 1-60 Min  Result Date: 03/05/2020 CLINICAL DATA:  Surgery, elective. C3-6 posterior fusion. Fluoroscopy time 12 seconds, 3.45 mGy EXAM: CERVICAL SPINE - 2-3 VIEW; DG C-ARM 1-60 MIN COMPARISON:  MRI of the cervical spine 02/26/2020 FINDINGS: Three intraoperative fluoroscopic images of the cervical spine are submitted. A posterior spinal fusion construct is now present at the C3-C6 levels. Superimposition of the shoulders limits evaluation of the C4 and more caudal levels on the lateral view images. Additionally, overlying artifact partially obscures the lower aspect of the right-sided fusion hardware on the PA image. Within these limitations, there is no unexpected finding. Partially visualized support tubes. IMPRESSION: Three intraoperative fluoroscopic images of the cervical spine as above. Electronically Signed   By: Kellie Simmering DO   On: 03/05/2020 18:13     Assessment/Plan: Diagnosis: Cervical spinal cord compression s/p cervical laminectomy and medial facetectomy C3-C4, C4-C5,and posterior cervical arthrodesis C3-C6.  1. Does the need for close, 24 hr/day medical supervision in concert with the patient's rehab needs make it unreasonable for this patient to be served in a less intensive setting? Yes 2. Co-Morbidities requiring supervision/potential complications: Numbness and weakness in bilateral upper and lower extremities, HLD, HTN, Type 2 DM, diastolic CHF 3. Due to bladder management, bowel management, safety, skin/wound care, disease management, medication administration, pain management and patient education, does the patient require 24 hr/day rehab nursing? Yes 4. Does the patient require coordinated care of a physician, rehab nurse, therapy disciplines of PT, OT to address  physical and functional deficits in the context of the above medical diagnosis(es)? Yes Addressing deficits in the following areas: balance, endurance, locomotion, strength, transferring, bowel/bladder control, bathing, dressing, feeding, grooming, toileting and psychosocial support 5. Can the patient actively participate in an intensive therapy program of at least 3 hrs of therapy per day at least 5 days per week? Yes 6. The potential for patient to make measurable gains while on inpatient rehab is excellent 7. Anticipated functional outcomes upon discharge from inpatient rehab are modified independent  with PT, modified independent with OT, independent with SLP. 8. Estimated rehab length of stay to reach the above functional goals is: 20-22 days 9. Anticipated discharge destination: Home 10. Overall Rehab/Functional Prognosis: excellent  RECOMMENDATIONS: This patient's condition is appropriate for continued rehabilitative care in the following setting: CIR Patient has agreed to participate in recommended program. Yes Note that insurance prior authorization may be required for reimbursement for recommended care.  Comment: Mr. Yepiz would be an excellent CIR candidate. He lives alone. ACs to follow on Monday.   Izora Ribas, MD 03/06/2020

## 2020-03-06 NOTE — Progress Notes (Signed)
PROGRESS NOTE    [name]  QZE:[id number] DOB: 10/12/1960 DOA: 02/25/2020 PCP: Ladell Pier, MD    Brief Narrative:  59 y.o.malewith medical history significant ofhypertension, hyperlipidemia, diabetes mellitus, TIA, diabetic neuropathy, chronic diastolic CHF, cocaine abuse, tobacco abuse, presents to emergency department with worsening numbness and weakness in bilateral upper and lower extremities since 1 day.  He has diabetic neuropathy however since yesterday he has started feeling worsening of his numbness and tingling and weakness sensation in bilateral upper and lower extremities. Tells me that he has difficulty lifting while picking up his utensils, unable to walk due to weakness. Reports that his symptoms are persistent, no aggravating or relieving factors. Denies association with recent trauma, fever, chills, headache, blurry vision, urinary, bowel incontinence.  He smokes cigarettes every day, denies alcohol use however uses cocaine now and then. His last cocaine use was 2 days ago. He lives alone at home and uses cane and walker for ambulation.  Upon arrival to ED: Patient's vital signs stable, afebrile, initial labs such as CBC, CMP: WNL. MRI cervical: Thoracic spine shows moderate spinal stenosis and cord compression at C3-C4 and C4-C5. Multilevel degenerative changes noted in thoracic spine. EDP talk to neurosurgery.[facility] hospitalist consulted for admission for cervical spinal stenosis with cord compression  Assessment & Plan:   Principal Problem:   Cervical spinal cord compression (HCC) Active Problems:   Hyperlipidemia associated with type 2 diabetes mellitus ([place])   Essential hypertension   Cocaine abuse ([place])   Type 2 diabetes mellitus (HCC)   Peripheral neuropathy   Diastolic congestive heart failure ([place])   Spinal stenosis, cervical region   Cervical stenosis of spinal canal   Cervical stenosis with cord compression Myelopathy,  quadriparesis Patient presenting from home with progressive weakness to bilateral upper and lower extremities.  Acutely worsening over the past 1-2 days.  MR C/T-spine notable for moderate spinal stenosis with cord compression C3-4 and C4-5 with associated cord edema and multilevel degenerative changes noted in the thoracic spine. --Neurosurgery following, appreciate assistance --pt was continued on Decadron 2mg  IV q8hrs --PT/OT following, recommendations for CIR --Tylenol, oxycodone, continue with Dilaudid as needed for pain control --Pt now s/p surgery 8/13 by Dr. Ronnald Ramp. CIR recommended by PT  Essential hypertension --Continue home labetalol, lisinopril as tolerated --Increase hydralazine to 25 mg twice daily on 03/02/2020 --Continue statin --Holding aspirin as above for possible surgical invention early next week. --Continue to monitor blood pressure closely and adjust as needed  Type 2 diabetes mellitus Hemoglobin A1c 7.1, well controlled. --Holding home Metformin, glipizide, Farxiga while in hospital --Currently on Lantus 40 units subcutaneously daily with NovoLog 8 units TID AC --Moderate dose insulin sliding scale for coverage while inpatient --Cont to check CBGs every 4 hours AC/at bedtime  Hyperlipidemia --continue Atorvastatin 40 mg p.o. daily as tolerated  Diabetic neuropathy --Continue Cymbalta and gabapentin  Chronic diastolic congestive heart failure, compensated --Continue labetalol, lisinopril --continue with Furosemide 40 mg p.o. daily --Continue statin as tolerated --Strict I's and O's and daily weights  Polysubstance abuse with cocaine, EtOH, tobacco --Review of UDS, positive cocaine on 11/08/2019, 01/23/2016, 10/03/2015.  Counseled regarding cessation with patient verbalizing understanding.  DVT prophylaxis: SCD's Code Status: Full Family Communication: Pt in room, family not at bedside  Status is: Inpatient  Remains inpatient appropriate  because:Ongoing diagnostic testing needed not appropriate for outpatient work up   Dispo:  Patient From: Home  Planned Disposition: Inpatient Rehab  Expected discharge date: 03/08/20  Medically stable for discharge:  No        Consultants:   Neurosurgery  CIR  Procedures:  8/13 - 1.  DeCompressive cervical laminectomy and medial facetectomy C3-4 C4-5, 2.  Posterior cervical arthrodesis C3-C6 utilizing locally harvested morselized autologous bone graft and morselized allograft, 3.  Lateral mass fixation C3-C6 inclusive utilizing Alphatec lateral mass screws  Antimicrobials: Anti-infectives (From admission, onward)   Start     Dose/Rate Route Frequency Ordered Stop   03/06/20 0000  ceFAZolin (ANCEF) IVPB 2g/100 mL premix        2 g 200 mL/hr over 30 Minutes Intravenous Every 8 hours 03/05/20 2134 03/06/20 0909   03/05/20 1707  bacitracin 50,000 Units in sodium chloride 0.9 % 500 mL irrigation  Status:  Discontinued          As needed 03/05/20 1707 03/05/20 1822      Subjective: Without complaints this AM. Seen with PT  Objective: Vitals:   03/05/20 2100 03/05/20 2332 03/06/20 0316 03/06/20 1047  BP: (!) 144/62 (!) 122/48  (!) 175/69  Pulse: 61 72 65 60  Resp: 12 16 16    Temp: 97.7 F (36.5 C) 97.6 F (36.4 C) 98.2 F (36.8 C)   TempSrc:  Oral Oral   SpO2: 100%  99%   Weight:  112 kg    Height:        Intake/Output Summary (Last 24 hours) at 03/06/2020 1609 Last data filed at 03/06/2020 1100 Gross per 24 hour  Intake 3354.87 ml  Output 1400 ml  Net 1954.87 ml   Filed Weights   03/04/20 0145 03/05/20 0416 03/05/20 2332  Weight: 105.4 kg 105.1 kg 112 kg    Examination: General exam: Awake, laying in bed, in nad Respiratory system: Normal respiratory effort, no wheezing Cardiovascular system: regular rate, s1, s2 Gastrointestinal system: Soft, nondistended, positive BS Central nervous system: CN2-12 grossly intact, strength intact Extremities: Perfused,  no clubbing Skin: Normal skin turgor, no notable skin lesions seen Psychiatry: Mood normal // no visual hallucinations   Data Reviewed: I have personally reviewed following labs and imaging studies  CBC: Recent Labs  Lab 03/02/20 0621  WBC 14.3*  HGB 13.7  HCT 42.3  MCV 83.9  PLT 485   Basic Metabolic Panel: Recent Labs  Lab 03/02/20 0621 03/03/20 0701  NA 138 138  K 4.9 4.3  CL 105 104  CO2 23 23  GLUCOSE 206* 175*  BUN 38* 33*  CREATININE 1.01 0.94  CALCIUM 9.3 9.1  MG 2.0  --    GFR: Estimated Creatinine Clearance: 111.5 mL/min (by C-G formula based on SCr of 0.94 mg/dL). Liver Function Tests: No results for input(s): AST, ALT, ALKPHOS, BILITOT, PROT, ALBUMIN in the last 168 hours. No results for input(s): LIPASE, AMYLASE in the last 168 hours. No results for input(s): AMMONIA in the last 168 hours. Coagulation Profile: No results for input(s): INR, PROTIME in the last 168 hours. Cardiac Enzymes: No results for input(s): CKTOTAL, CKMB, CKMBINDEX, TROPONINI in the last 168 hours. BNP (last 3 results) No results for input(s): PROBNP in the last 8760 hours. HbA1C: No results for input(s): HGBA1C in the last 72 hours. CBG: Recent Labs  Lab 03/05/20 1434 03/05/20 1835 03/05/20 2209 03/06/20 0605 03/06/20 1205  GLUCAP 120* 120* 179* 304* 154*   Lipid Profile: No results for input(s): CHOL, HDL, LDLCALC, TRIG, CHOLHDL, LDLDIRECT in the last 72 hours. Thyroid Function Tests: No results for input(s): TSH, T4TOTAL, FREET4, T3FREE, THYROIDAB in the last 72 hours. Anemia Panel: No  results for input(s): VITAMINB12, FOLATE, FERRITIN, TIBC, IRON, RETICCTPCT in the last 72 hours. Sepsis Labs: No results for input(s): PROCALCITON, LATICACIDVEN in the last 168 hours.  Recent Results (from the past 240 hour(s))  SARS Coronavirus 2 by RT PCR (hospital order, performed in [facility] hospital lab) Nasopharyngeal Nasopharyngeal Swab     Status: None   Collection Time:  02/26/20  6:50 PM   Specimen: Nasopharyngeal Swab  Result Value Ref Range Status   SARS Coronavirus 2 NEGATIVE NEGATIVE Final    Comment: (NOTE) SARS-CoV-2 target nucleic acids are NOT DETECTED.  The SARS-CoV-2 RNA is generally detectable in upper and lower respiratory specimens during the acute phase of infection. The lowest concentration of SARS-CoV-2 viral copies this assay can detect is 250 copies / mL. A negative result does not preclude SARS-CoV-2 infection and should not be used as the sole basis for treatment or other patient management decisions.  A negative result may occur with improper specimen collection / handling, submission of specimen other than nasopharyngeal swab, presence of viral mutation(s) within the areas targeted by this assay, and inadequate number of viral copies (<250 copies / mL). A negative result must be combined with clinical observations, patient history, and epidemiological information.  Fact Sheet for Patients:   StrictlyIdeas.no  Fact Sheet for Healthcare Providers: BankingDealers.co.za  This test is not yet approved or  cleared by the [place] FDA and has been authorized for detection and/or diagnosis of SARS-CoV-2 by FDA under an Emergency Use Authorization (EUA).  This EUA will remain in effect (meaning this test can be used) for the duration of the COVID-19 declaration under Section 564(b)(1) of the Act, 21 U.S.C. section 360bbb-3(b)(1), unless the authorization is terminated or revoked sooner.  Performed at [facility], [place] [address] [place], [place] [address]   Surgical pcr screen     Status: Abnormal   Collection Time: 03/04/20  8:30 PM   Specimen: Nasal Mucosa; Nasal Swab  Result Value Ref Range Status   MRSA, PCR NEGATIVE NEGATIVE Final   Staphylococcus aureus POSITIVE (A) NEGATIVE Final    Comment: (NOTE) The Xpert SA Assay (FDA approved for NASAL specimens in patients  19 years of age and older), is one component of a comprehensive surveillance program. It is not intended to diagnose infection nor to guide or monitor treatment. Performed at [facility], [place] [address] [place], [place] [address]   Aerobic Culture (superficial specimen)     Status: None (Preliminary result)   Collection Time: 03/05/20  4:52 PM   Specimen: Abscess  Result Value Ref Range Status   Specimen Description ABSCESS  Final   Special Requests POSTERIOR CERVICAL  Final   Gram Stain NO WBC SEEN NO ORGANISMS SEEN   Final   Culture   Final    NO GROWTH < 24 HOURS Performed at [facility], [place] [address], [place],  [address]    Report Status PENDING  Incomplete     Radiology Studies: DG Cervical Spine 2-3 Views  Result Date: 03/05/2020 CLINICAL DATA:  Surgery, elective. C3-6 posterior fusion. Fluoroscopy time 12 seconds, 3.45 mGy EXAM: CERVICAL SPINE - 2-3 VIEW; DG C-ARM 1-60 MIN COMPARISON:  MRI of the cervical spine 02/26/2020 FINDINGS: Three intraoperative fluoroscopic images of the cervical spine are submitted. A posterior spinal fusion construct is now present at the C3-C6 levels. Superimposition of the shoulders limits evaluation of the C4 and more caudal levels on the lateral view images. Additionally,  overlying artifact partially obscures the lower aspect of the right-sided fusion hardware on the PA image. Within these limitations, there is no unexpected finding. Partially visualized support tubes. IMPRESSION: Three intraoperative fluoroscopic images of the cervical spine as above. Electronically Signed   By: Kellie Simmering DO   On: 03/05/2020 18:13   DG C-Arm 1-60 Min  Result Date: 03/05/2020 CLINICAL DATA:  Surgery, elective. C3-6 posterior fusion. Fluoroscopy time 12 seconds, 3.45 mGy EXAM: CERVICAL SPINE - 2-3 VIEW; DG C-ARM 1-60 MIN COMPARISON:  MRI of the cervical spine 02/26/2020 FINDINGS: Three intraoperative fluoroscopic images of the cervical  spine are submitted. A posterior spinal fusion construct is now present at the C3-C6 levels. Superimposition of the shoulders limits evaluation of the C4 and more caudal levels on the lateral view images. Additionally, overlying artifact partially obscures the lower aspect of the right-sided fusion hardware on the PA image. Within these limitations, there is no unexpected finding. Partially visualized support tubes. IMPRESSION: Three intraoperative fluoroscopic images of the cervical spine as above. Electronically Signed   By: Kellie Simmering DO   On: 03/05/2020 18:13    Scheduled Meds: . atorvastatin  40 mg Oral Daily  . dexamethasone (DECADRON) injection  4 mg Intravenous Q6H   Or  . dexamethasone  4 mg Oral Q6H  . DULoxetine  60 mg Oral Daily  . furosemide  40 mg Oral Daily  . gabapentin  300 mg Oral BID  . hydrALAZINE  25 mg Oral BID  . insulin aspart  0-15 Units Subcutaneous TID WC  . insulin aspart  0-5 Units Subcutaneous QHS  . insulin aspart  8 Units Subcutaneous TID WC  . insulin glargine  40 Units Subcutaneous Daily  . labetalol  100 mg Oral BID  . lisinopril  40 mg Oral Daily  . mupirocin ointment  1 application Nasal BID  . senna  1 tablet Oral BID  . sodium chloride flush  3 mL Intravenous Q12H   Continuous Infusions: . sodium chloride 250 mL (03/05/20 2337)  . 0.9 % NaCl with KCl 20 mEq / L    . methocarbamol (ROBAXIN) IV       LOS: 9 days   Marylu Lund, MD [facility] Pager On Amion  If 7PM-7AM, please contact night-coverage 03/06/2020, 4:09 PM

## 2020-03-06 NOTE — Progress Notes (Signed)
NEUROSURGERY PROGRESS NOTE  Doing well. Complains of appropriate necksoreness. No arm pain Incision CDI Strength unchanged  Temp:  [97.6 F (36.4 C)-98.2 F (36.8 C)] 98.2 F (36.8 C) (08/14 0316) Pulse Rate:  [56-72] 65 (08/14 0316) Resp:  [10-18] 16 (08/14 0316) BP: (122-144)/(48-72) 122/48 (08/13 2332) SpO2:  [98 %-100 %] 99 % (08/14 0316) Weight:  [364 kg] 112 kg (08/13 2332)  Plan: Therapies today. Continue drain probably through Monday depending on output.   Eleonore Chiquito, NP 03/06/2020 8:51 AM

## 2020-03-07 DIAGNOSIS — I10 Essential (primary) hypertension: Secondary | ICD-10-CM | POA: Diagnosis not present

## 2020-03-07 LAB — COMPREHENSIVE METABOLIC PANEL
ALT: 16 U/L (ref 0–44)
AST: 13 U/L — ABNORMAL LOW (ref 15–41)
Albumin: 2.9 g/dL — ABNORMAL LOW (ref 3.5–5.0)
Alkaline Phosphatase: 53 U/L (ref 38–126)
Anion gap: 9 (ref 5–15)
BUN: 39 mg/dL — ABNORMAL HIGH (ref 8–23)
CO2: 21 mmol/L — ABNORMAL LOW (ref 22–32)
Calcium: 8.6 mg/dL — ABNORMAL LOW (ref 8.9–10.3)
Chloride: 103 mmol/L (ref 98–111)
Creatinine, Ser: 1.05 mg/dL (ref 0.61–1.24)
GFR calc Af Amer: >60 mL/min (ref >60)
GFR calc non Af Amer: >60 mL/min (ref >60)
Glucose, Bld: 323 mg/dL — ABNORMAL HIGH (ref 70–99)
Potassium: 4.8 mmol/L (ref 3.5–5.1)
Sodium: 133 mmol/L — ABNORMAL LOW (ref 135–145)
Total Bilirubin: 0.7 mg/dL (ref 0.3–1.2)
Total Protein: 5.7 g/dL — ABNORMAL LOW (ref 6.5–8.1)

## 2020-03-07 LAB — GLUCOSE, CAPILLARY
Glucose-Capillary: 308 mg/dL — ABNORMAL HIGH (ref 70–99)
Glucose-Capillary: 282 mg/dL — ABNORMAL HIGH (ref 70–99)
Glucose-Capillary: 148 mg/dL — ABNORMAL HIGH (ref 70–99)
Glucose-Capillary: 339 mg/dL — ABNORMAL HIGH (ref 70–99)

## 2020-03-07 LAB — ABO/RH: ABO/RH(D): AB POS

## 2020-03-07 LAB — CBC
HCT: 39.2 % (ref 39.0–52.0)
Hemoglobin: 12.7 g/dL — ABNORMAL LOW (ref 13.0–17.0)
MCH: 27 pg (ref 26.0–34.0)
MCHC: 32.4 g/dL (ref 30.0–36.0)
MCV: 83.2 fL (ref 80.0–100.0)
Platelets: 247 10*3/uL (ref 150–400)
RBC: 4.71 MIL/uL (ref 4.22–5.81)
RDW: 13.8 % (ref 11.5–15.5)
WBC: 18.9 10*3/uL — ABNORMAL HIGH (ref 4.0–10.5)
nRBC: 0 % (ref 0.0–0.2)

## 2020-03-07 MED ORDER — INSULIN GLARGINE 100 UNIT/ML ~~LOC~~ SOLN
45.0000 [IU] | Freq: Every day | SUBCUTANEOUS | Status: DC
  Filled 2020-03-07: qty 0.45

## 2020-03-07 MED ORDER — INSULIN ASPART 100 UNIT/ML ~~LOC~~ SOLN
10.0000 [IU] | Freq: Three times a day (TID) | SUBCUTANEOUS | Status: DC
  Administered 2020-03-08: 10 [IU] via SUBCUTANEOUS

## 2020-03-07 NOTE — Progress Notes (Signed)
Inpatient Rehab Admissions Coordinator:  Pt's sister, Medardo Hassing returned called. Explained CIR program to her. She acknowledged understanding of CIR goals and expectations.  She indicated that she would give [facility] a call tomorrow after she has been able to determine if friends/family can provide supervision/assistance for pt after d/c from hospital.

## 2020-03-07 NOTE — Plan of Care (Signed)
  Problem: Clinical Measurements: ?Goal: Ability to maintain clinical measurements within normal limits will improve ?Outcome: Progressing ?  ?Problem: Nutrition: ?Goal: Adequate nutrition will be maintained ?Outcome: Progressing ?  ?Problem: Skin Integrity: ?Goal: Risk for impaired skin integrity will decrease ?Outcome: Progressing ?  ?

## 2020-03-07 NOTE — Progress Notes (Signed)
PROGRESS NOTE    COLUM COLT  XIP:[id number] DOB: 1960/10/19 DOA: 02/25/2020 PCP: Ladell Pier, MD    Brief Narrative:  59 y.o.malewith medical history significant ofhypertension, hyperlipidemia, diabetes mellitus, TIA, diabetic neuropathy, chronic diastolic CHF, cocaine abuse, tobacco abuse, presents to emergency department with worsening numbness and weakness in bilateral upper and lower extremities since 1 day.  He has diabetic neuropathy however since yesterday he has started feeling worsening of his numbness and tingling and weakness sensation in bilateral upper and lower extremities. Tells me that he has difficulty lifting while picking up his utensils, unable to walk due to weakness. Reports that his symptoms are persistent, no aggravating or relieving factors. Denies association with recent trauma, fever, chills, headache, blurry vision, urinary, bowel incontinence.  He smokes cigarettes every day, denies alcohol use however uses cocaine now and then. His last cocaine use was 2 days ago. He lives alone at home and uses cane and walker for ambulation.  Upon arrival to ED: Patient's vital signs stable, afebrile, initial labs such as CBC, CMP: WNL. MRI cervical: Thoracic spine shows moderate spinal stenosis and cord compression at C3-C4 and C4-C5. Multilevel degenerative changes noted in thoracic spine. EDP talk to neurosurgery.[facility] hospitalist consulted for admission for cervical spinal stenosis with cord compression  Assessment & Plan:   Principal Problem:   Cervical spinal cord compression (HCC) Active Problems:   Hyperlipidemia associated with type 2 diabetes mellitus ([place])   Essential hypertension   Cocaine abuse ([place])   Type 2 diabetes mellitus (HCC)   Peripheral neuropathy   Diastolic congestive heart failure ([place])   Spinal stenosis, cervical region   Cervical stenosis of spinal canal   Cervical stenosis with cord compression Myelopathy,  quadriparesis Patient presenting from home with progressive weakness to bilateral upper and lower extremities.  Acutely worsening over the past 1-2 days.  MR C/T-spine notable for moderate spinal stenosis with cord compression C3-4 and C4-5 with associated cord edema and multilevel degenerative changes noted in the thoracic spine. --Neurosurgery following, appreciate assistance --pt was continued on Decadron 2mg  IV q8hrs --PT/OT following, recommendations for CIR --Tylenol, oxycodone, continue with Dilaudid as needed for pain control --Pt now s/p surgery 8/13 by Dr. Ronnald Ramp. CIR was recommended by PT, CIR following  Essential hypertension --Continue home labetalol, lisinopril as tolerated --Increase hydralazine to 25 mg twice daily on 03/02/2020 --Continue statin --Holding aspirin as above for possible surgical invention early next week. --Continue to monitor blood pressure closely and adjust as needed  Type 2 diabetes mellitus Hemoglobin A1c 7.1, well controlled. --Holding home Metformin, glipizide, Farxiga while in hospital --Currently on Lantus 40 units subcutaneously daily with NovoLog 8 units TID AC --Glucose in the 200-300's --increase lantus to 45 units and novolog to 10 units with meals --Cont to check CBGs every 4 hours AC/at bedtime  Hyperlipidemia --continue Atorvastatin 40 mg p.o. daily as tolerated  Diabetic neuropathy --Continue Cymbalta and gabapentin  Chronic diastolic congestive heart failure, compensated --Continue labetalol, lisinopril --continue with Furosemide 40 mg p.o. daily --Continue statin as tolerated --Strict I's and O's and daily weights  Polysubstance abuse with cocaine, EtOH, tobacco --Review of UDS, positive cocaine on 11/08/2019, 01/23/2016, 10/03/2015.  Counseled regarding cessation with patient verbalizing understanding.  DVT prophylaxis: SCD's Code Status: Full Family Communication: Pt in room, family not at bedside  Status is:  Inpatient  Remains inpatient appropriate because:Ongoing diagnostic testing needed not appropriate for outpatient work up   Dispo:  Patient From: Home  Planned Disposition: Inpatient Rehab  Expected discharge date: 03/08/20  Medically stable for discharge: No        Consultants:   Neurosurgery  CIR  Procedures:  8/13 - 1.  DeCompressive cervical laminectomy and medial facetectomy C3-4 C4-5, 2.  Posterior cervical arthrodesis C3-C6 utilizing locally harvested morselized autologous bone graft and morselized allograft, 3.  Lateral mass fixation C3-C6 inclusive utilizing Alphatec lateral mass screws  Antimicrobials: Anti-infectives (From admission, onward)   Start     Dose/Rate Route Frequency Ordered Stop   03/06/20 0000  ceFAZolin (ANCEF) IVPB 2g/100 mL premix        2 g 200 mL/hr over 30 Minutes Intravenous Every 8 hours 03/05/20 2134 03/06/20 0909   03/05/20 1707  bacitracin 50,000 Units in sodium chloride 0.9 % 500 mL irrigation  Status:  Discontinued          As needed 03/05/20 1707 03/05/20 1822      Subjective: No complaints this AM  Objective: Vitals:   03/07/20 0014 03/07/20 0408 03/07/20 0903 03/07/20 1238  BP: (!) 145/59 (!) 153/71 (!) 153/63 (!) 148/55  Pulse: 67 60 (!) 58 65  Resp: 18 18 18 20   Temp: 99.2 F (37.3 C) 98.2 F (36.8 C) 97.6 F (36.4 C) (!) 97.5 F (36.4 C)  TempSrc: Oral Oral Oral Oral  SpO2: 95% 99% 100% 100%  Weight:  109.2 kg    Height:        Intake/Output Summary (Last 24 hours) at 03/07/2020 1733 Last data filed at 03/07/2020 0502 Gross per 24 hour  Intake 420 ml  Output 1515 ml  Net -1095 ml   Filed Weights   03/05/20 0416 03/05/20 2332 03/07/20 0408  Weight: 105.1 kg 112 kg 109.2 kg    Examination: General exam: Conversant, in no acute distress Respiratory system: normal chest rise, clear, no audible wheezing Cardiovascular system: regular rhythm, s1-s2 Gastrointestinal system: Nondistended, nontender, pos  BS Central nervous system: No seizures, no tremors Extremities: No cyanosis, no joint deformities Skin: No rashes, no pallor Psychiatry: Affect normal // no auditory hallucinations   Data Reviewed: I have personally reviewed following labs and imaging studies  CBC: Recent Labs  Lab 03/02/20 0621 03/07/20 0316  WBC 14.3* 18.9*  HGB 13.7 12.7*  HCT 42.3 39.2  MCV 83.9 83.2  PLT 266 578   Basic Metabolic Panel: Recent Labs  Lab 03/02/20 0621 03/03/20 0701 03/07/20 0316  NA 138 138 133*  K 4.9 4.3 4.8  CL 105 104 103  CO2 23 23 21*  GLUCOSE 206* 175* 323*  BUN 38* 33* 39*  CREATININE 1.01 0.94 1.05  CALCIUM 9.3 9.1 8.6*  MG 2.0  --   --    GFR: Estimated Creatinine Clearance: 98.6 mL/min (by C-G formula based on SCr of 1.05 mg/dL). Liver Function Tests: Recent Labs  Lab 03/07/20 0316  AST 13*  ALT 16  ALKPHOS 53  BILITOT 0.7  PROT 5.7*  ALBUMIN 2.9*   No results for input(s): LIPASE, AMYLASE in the last 168 hours. No results for input(s): AMMONIA in the last 168 hours. Coagulation Profile: No results for input(s): INR, PROTIME in the last 168 hours. Cardiac Enzymes: No results for input(s): CKTOTAL, CKMB, CKMBINDEX, TROPONINI in the last 168 hours. BNP (last 3 results) No results for input(s): PROBNP in the last 8760 hours. HbA1C: No results for input(s): HGBA1C in the last 72 hours. CBG: Recent Labs  Lab 03/06/20 1617 03/06/20 2107 03/07/20 0608 03/07/20 1149 03/07/20 1700  GLUCAP 141* 267* 308* 282*  148*   Lipid Profile: No results for input(s): CHOL, HDL, LDLCALC, TRIG, CHOLHDL, LDLDIRECT in the last 72 hours. Thyroid Function Tests: No results for input(s): TSH, T4TOTAL, FREET4, T3FREE, THYROIDAB in the last 72 hours. Anemia Panel: No results for input(s): VITAMINB12, FOLATE, FERRITIN, TIBC, IRON, RETICCTPCT in the last 72 hours. Sepsis Labs: No results for input(s): PROCALCITON, LATICACIDVEN in the last 168 hours.  Recent Results (from the  past 240 hour(s))  SARS Coronavirus 2 by RT PCR (hospital order, performed in [facility] hospital lab) Nasopharyngeal Nasopharyngeal Swab     Status: None   Collection Time: 02/26/20  6:50 PM   Specimen: Nasopharyngeal Swab  Result Value Ref Range Status   SARS Coronavirus 2 NEGATIVE NEGATIVE Final    Comment: (NOTE) SARS-CoV-2 target nucleic acids are NOT DETECTED.  The SARS-CoV-2 RNA is generally detectable in upper and lower respiratory specimens during the acute phase of infection. The lowest concentration of SARS-CoV-2 viral copies this assay can detect is 250 copies / mL. A negative result does not preclude SARS-CoV-2 infection and should not be used as the sole basis for treatment or other patient management decisions.  A negative result may occur with improper specimen collection / handling, submission of specimen other than nasopharyngeal swab, presence of viral mutation(s) within the areas targeted by this assay, and inadequate number of viral copies (<250 copies / mL). A negative result must be combined with clinical observations, patient history, and epidemiological information.  Fact Sheet for Patients:   StrictlyIdeas.no  Fact Sheet for Healthcare Providers: BankingDealers.co.za  This test is not yet approved or  cleared by the [place] FDA and has been authorized for detection and/or diagnosis of SARS-CoV-2 by FDA under an Emergency Use Authorization (EUA).  This EUA will remain in effect (meaning this test can be used) for the duration of the COVID-19 declaration under Section 564(b)(1) of the Act, 21 U.S.C. section 360bbb-3(b)(1), unless the authorization is terminated or revoked sooner.  Performed at [facility], [place] [address], [place], [place] [address]   Surgical pcr screen     Status: Abnormal   Collection Time: 03/04/20  8:30 PM   Specimen: Nasal Mucosa; Nasal Swab  Result Value Ref Range Status    MRSA, PCR NEGATIVE NEGATIVE Final   Staphylococcus aureus POSITIVE (A) NEGATIVE Final    Comment: (NOTE) The Xpert SA Assay (FDA approved for NASAL specimens in patients 1 years of age and older), is one component of a comprehensive surveillance program. It is not intended to diagnose infection nor to guide or monitor treatment. Performed at [facility], [place] [address] [place], [place] [address]   Aerobic Culture (superficial specimen)     Status: None (Preliminary result)   Collection Time: 03/05/20  4:52 PM   Specimen: Abscess  Result Value Ref Range Status   Specimen Description ABSCESS  Final   Special Requests POSTERIOR CERVICAL  Final   Gram Stain NO WBC SEEN NO ORGANISMS SEEN   Final   Culture   Final    NO GROWTH 2 DAYS Performed at [facility], [place] [address], [place],  [address]    Report Status PENDING  Incomplete     Radiology Studies: DG Cervical Spine 2-3 Views  Result Date: 03/05/2020 CLINICAL DATA:  Surgery, elective. C3-6 posterior fusion. Fluoroscopy time 12 seconds, 3.45 mGy EXAM: CERVICAL SPINE - 2-3 VIEW; DG C-ARM 1-60 MIN COMPARISON:  MRI of the cervical spine 02/26/2020 FINDINGS: Three intraoperative  fluoroscopic images of the cervical spine are submitted. A posterior spinal fusion construct is now present at the C3-C6 levels. Superimposition of the shoulders limits evaluation of the C4 and more caudal levels on the lateral view images. Additionally, overlying artifact partially obscures the lower aspect of the right-sided fusion hardware on the PA image. Within these limitations, there is no unexpected finding. Partially visualized support tubes. IMPRESSION: Three intraoperative fluoroscopic images of the cervical spine as above. Electronically Signed   By: Kellie Simmering DO   On: 03/05/2020 18:13   DG C-Arm 1-60 Min  Result Date: 03/05/2020 CLINICAL DATA:  Surgery, elective. C3-6 posterior fusion. Fluoroscopy time 12 seconds, 3.45 mGy  EXAM: CERVICAL SPINE - 2-3 VIEW; DG C-ARM 1-60 MIN COMPARISON:  MRI of the cervical spine 02/26/2020 FINDINGS: Three intraoperative fluoroscopic images of the cervical spine are submitted. A posterior spinal fusion construct is now present at the C3-C6 levels. Superimposition of the shoulders limits evaluation of the C4 and more caudal levels on the lateral view images. Additionally, overlying artifact partially obscures the lower aspect of the right-sided fusion hardware on the PA image. Within these limitations, there is no unexpected finding. Partially visualized support tubes. IMPRESSION: Three intraoperative fluoroscopic images of the cervical spine as above. Electronically Signed   By: Kellie Simmering DO   On: 03/05/2020 18:13    Scheduled Meds: . atorvastatin  40 mg Oral Daily  . dexamethasone (DECADRON) injection  4 mg Intravenous Q6H   Or  . dexamethasone  4 mg Oral Q6H  . DULoxetine  60 mg Oral Daily  . furosemide  40 mg Oral Daily  . gabapentin  300 mg Oral BID  . hydrALAZINE  25 mg Oral BID  . insulin aspart  0-15 Units Subcutaneous TID WC  . insulin aspart  0-5 Units Subcutaneous QHS  . insulin aspart  8 Units Subcutaneous TID WC  . insulin glargine  40 Units Subcutaneous Daily  . labetalol  100 mg Oral BID  . lisinopril  40 mg Oral Daily  . mupirocin ointment  1 application Nasal BID  . senna  1 tablet Oral BID  . sodium chloride flush  3 mL Intravenous Q12H   Continuous Infusions: . sodium chloride 250 mL (03/05/20 2337)  . 0.9 % NaCl with KCl 20 mEq / L    . methocarbamol (ROBAXIN) IV       LOS: 10 days   Marylu Lund, MD [facility] Pager On Amion  If 7PM-7AM, please contact night-coverage 03/07/2020, 5:33 PM

## 2020-03-07 NOTE — Progress Notes (Signed)
Subjective: Patient reports mild neck pain. Reports no change in strength  Objective: Vital signs in last 24 hours: Temp:  [97.6 F (36.4 C)-99.2 F (37.3 C)] 98.2 F (36.8 C) (08/15 0408) Pulse Rate:  [59-67] 60 (08/15 0408) Resp:  [18] 18 (08/15 0408) BP: (144-175)/(59-71) 153/71 (08/15 0408) SpO2:  [95 %-100 %] 99 % (08/15 0408) Weight:  [109.2 kg] 109.2 kg (08/15 0408)  Intake/Output from previous day: 08/14 0701 - 08/15 0700 In: 679.3 [P.O.:660; I.V.:19.3] Out: 2115 [Urine:1900; Drains:215] Intake/Output this shift: No intake/output data recorded.  Strength still the same, no change.   Lab Results: Lab Results  Component Value Date   WBC 18.9 (H) 03/07/2020   HGB 12.7 (L) 03/07/2020   HCT 39.2 03/07/2020   MCV 83.2 03/07/2020   PLT 247 03/07/2020   Lab Results  Component Value Date   INR 1.0 12/25/2019   BMET Lab Results  Component Value Date   NA 133 (L) 03/07/2020   K 4.8 03/07/2020   CL 103 03/07/2020   CO2 21 (L) 03/07/2020   GLUCOSE 323 (H) 03/07/2020   BUN 39 (H) 03/07/2020   CREATININE 1.05 03/07/2020   CALCIUM 8.6 (L) 03/07/2020    Studies/Results: DG Cervical Spine 2-3 Views  Result Date: 03/05/2020 CLINICAL DATA:  Surgery, elective. C3-6 posterior fusion. Fluoroscopy time 12 seconds, 3.45 mGy EXAM: CERVICAL SPINE - 2-3 VIEW; DG C-ARM 1-60 MIN COMPARISON:  MRI of the cervical spine 02/26/2020 FINDINGS: Three intraoperative fluoroscopic images of the cervical spine are submitted. A posterior spinal fusion construct is now present at the C3-C6 levels. Superimposition of the shoulders limits evaluation of the C4 and more caudal levels on the lateral view images. Additionally, overlying artifact partially obscures the lower aspect of the right-sided fusion hardware on the PA image. Within these limitations, there is no unexpected finding. Partially visualized support tubes. IMPRESSION: Three intraoperative fluoroscopic images of the cervical spine as  above. Electronically Signed   By: Kellie Simmering DO   On: 03/05/2020 18:13   DG C-Arm 1-60 Min  Result Date: 03/05/2020 CLINICAL DATA:  Surgery, elective. C3-6 posterior fusion. Fluoroscopy time 12 seconds, 3.45 mGy EXAM: CERVICAL SPINE - 2-3 VIEW; DG C-ARM 1-60 MIN COMPARISON:  MRI of the cervical spine 02/26/2020 FINDINGS: Three intraoperative fluoroscopic images of the cervical spine are submitted. A posterior spinal fusion construct is now present at the C3-C6 levels. Superimposition of the shoulders limits evaluation of the C4 and more caudal levels on the lateral view images. Additionally, overlying artifact partially obscures the lower aspect of the right-sided fusion hardware on the PA image. Within these limitations, there is no unexpected finding. Partially visualized support tubes. IMPRESSION: Three intraoperative fluoroscopic images of the cervical spine as above. Electronically Signed   By: Kellie Simmering DO   On: 03/05/2020 18:13    Assessment/Plan: S/p posterior cervical decompression and fusion for myelopathy. Continue therapies. D/c drain today.   LOS: 10 days    Ocie Cornfield [facility] 03/07/2020, 7:44 AM

## 2020-03-07 NOTE — H&P (Signed)
Physical Medicine and Rehabilitation Admission H&P    Chief Complaint  Patient presents with  . Hypertension  . 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 Sherley Bounds with conservative care.  Per chart review patient lives alone.  1 level apartment.  Has an aide 3 days a week for 2 hours at a time used a walker and cane prior to admission.  Aide does assist with some ADLs.  Presented 02/26/2020 progressive weakness/gait instability lower extremities left greater than right as well as numbness and spasticity with stool incontinence.  Admission chemistries unremarkable except glucose 209, BUN 26, creatinine 1.46, hemoglobin 14.7, hemoglobin A1c 7.1, SARS coronavirus negative.  Recent CTs and brain MRIs demonstrated diffuse atrophy negative for acute infarct.  X-rays and imaging revealed severe spinal stenosis/myelopathy at C3-4, C4-5.  Patient underwent decompressive cervical laminectomy and medial facetectomy C3-4 4-5 with posterior cervical arthrodesis C3-C6 with lateral mass fixation 03/05/2020 per Dr. Sherley Bounds.  Cervical soft collar as directed.  Decadron protocol initiated.  Tolerating a regular consistency diet.  Therapy evaluations completed and patient was admitted for a comprehensive rehab program.   Pt notes his fall that started all his weakness occurred in 2015- he's been having a BM ~q 1week- which is normal for him; does have control of stools and using bedpan usually. Feels like needed to go- belly "growling".  Urinating OK- using condom catheter- explained  hard to use condom cathter in CIR since up doing therapy a lot of the day.  Pt said sometimes goes "too fast' - and hits 'fast" when needs to go.  Notes pain mainly in neck and meds are helpful, but don't work as quick as he would like. Eases pain.   Has muscles spasms- annoying, not painful; in arms and legs.     Review of Systems  Constitutional: Negative for chills, fever and malaise/fatigue.  HENT: Negative for hearing loss.   Eyes: Negative for blurred vision and double vision.  Respiratory: Negative for cough and shortness of breath.   Cardiovascular: Positive for leg swelling. Negative for chest pain and palpitations.  Gastrointestinal: Negative for heartburn, nausea and vomiting.       Stool incontinence  Genitourinary: Positive for urgency. Negative for dysuria, flank pain and hematuria.  Musculoskeletal: Positive for back pain, joint pain and myalgias.  Skin: Negative for rash.  Neurological: Positive for weakness.       Spasticity  All other systems reviewed and are negative.  Past Medical History:  Diagnosis Date  . Alcohol abuse    11-15-2017  per pt last alcohol Dec 2018  . BPH with obstruction/lower urinary tract symptoms   . Chronic arm pain   . Chronic pain    arms, leg, back  . CKD (chronic kidney disease), stage II   . Cocaine abuse ([place])    11-15-2017  per pt last used March 2019  . Congenital hydrocephalus, unspecified (HCC)    slow progression  . Diabetic retinopathy of both eyes ([place])   . Diastolic CHF, chronic ([place]) 07/2017  . Gait instability    multifactorial -- slow worsening congenital hydrocephalus, peripheral neuropathy, left  C5-6 cord lesion  . Hematuria   . History of acute pulmonary edema 07/2017  . History of spinal cord injury 01/02/2014   pt fell, caused spinal cord contusion at C5-6--  residual left side weakness    . 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 History of  TIA (transient ischemic attack) and stroke    hx cva 12-20-2010 and  TIA 02-22-2011--- no residual's from cva or tia  . Hyperlipidemia   . Hypertension    followed by pcp  . 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 Lesion of bladder   . Peripheral edema    chronic LUE  . Peripheral neuropathy   . Type 2 diabetes mellitus ([place])    followed by dr Karle Plumber--  last A1c 6.8 on 11-06-2017  . Weak urinary stream    Past Surgical History:  Procedure Laterality Date  . CARDIAC CATHETERIZATION  12/01/2008   dr hochrein   abnormal stress myoview:  mild coronary plaque, normal LVF  . CARDIAC CATHETERIZATION  04/29/2011   dr [place]ique   in setting ECG with new ST elevation & severe hypertensive:  nonobstructive atherosclerotic CAD, normal LVF (30% mRCA)   . CARDIOVASCULAR STRESS TEST  08/15/2010   Low nuclear study w/ no evidence ishemia/  ef 42% with lateral and apical hypokinesis  . CYSTOSCOPY WITH BIOPSY N/A 11/20/2017   Procedure: CYSTOSCOPY WITH BIOPSY AND FULGURATION;  Surgeon: [name], MD;  [facility];  Service: Urology;  Laterality: N/A;  . INCISION AND DRAINAGE RIGHT DISTAL MEDIAL THIGH HEMATOMA  04-14-2005   dr Ninfa Linden  . left arm skin graft  1976   injury  . LEFT HEART CATHETERIZATION WITH CORONARY ANGIOGRAM Bilateral 12/26/2013   Procedure: LEFT HEART CATHETERIZATION WITH CORONARY ANGIOGRAM;  Surgeon: Burnell Blanks, MD;  Location: [facility] CATH LAB;  Service: Cardiovascular;  Laterality: Bilateral;  STEMI, in setting cocaine/ alcohol :  mid disease in the RCA (20%), moderate disease in the small caliber intermediate branch (distal 50-60%), normal LVSF (ef 55-60%)  . TOOTH EXTRACTION N/A 05/16/2018   Procedure: DENTAL RESTORATION/EXTRACTIONS WITH ALVEO;  Surgeon: Diona Browner, DDS;  Location: [facility];  Service: Oral Surgery;  Laterality: N/A;  . TRANSTHORACIC ECHOCARDIOGRAM  08/05/2017   moderate LVH,   ef 55%,  grade 1 diastolic dysfunction/  mild LAE/  trivial Tr   Family History  Problem Relation Age of Onset  . Hypertension Mother   . Diabetes Mother   . Cancer Mother        breast cancer   . Hypertension Father   . Heart disease Father   . Stroke Father   . Hypertension Sister    Social History:  reports that he quit smoking about 2 years ago. His smoking use included cigarettes. He quit after 45.00 years of use. He has never used smokeless tobacco. He reports previous alcohol use. He reports previous drug use. Drugs: "Crack" cocaine and Cocaine. Allergies: No Known Allergies Medications Prior to Admission  Medication Sig Dispense Refill  . acetaminophen (TYLENOL) 500 MG tablet TAKE ONE TABLET BY MOUTH TWICE A DAY AS NEEDED (Patient taking differently: Take 500 mg by mouth 2 (two) times daily as needed. ) 60 tablet 0  . ASPIR-LOW 81 MG EC tablet TAKE 1 TABLET (81 MG TOTAL) BY MOUTH DAILY. (Patient taking differently: Take 81 mg by mouth daily. ) 30 tablet 3  .  atorvastatin (LIPITOR) 40 MG tablet TAKE 1.5 TABLETS BY MOUTH DAILY (Patient taking differently: Take 40 mg by mouth daily. ) 45 tablet 2  . blood glucose meter kit and supplies KIT Dispense based on patient and insurance preference. Use once daily as directed. (E11.9) (Patient taking differently: Inject 1 each into the skin See admin instructions. Dispense based on patient and insurance preference. Use once daily as directed. (E11.9)) 1 each 0  . Blood Glucose Monitoring Suppl (ACCU-CHEK GUIDE ME) w/Device KIT 1 kit by Does not apply route 3 (three) times daily. Use as instructed to check blood sugar 3 times daily. E11.14 (Patient taking differently: 1 kit by Does not apply route See admin instructions. Use as instructed to check blood sugar 3 times daily. E11.14) 1 kit 0  . dapagliflozin propanediol (FARXIGA) 10 MG TABS tablet Take 1 tablet (10 mg total) by mouth daily before breakfast. 30 tablet 6  . DULoxetine (CYMBALTA) 60 MG  capsule TAKE ONE CAPSULE BY MOUTH DAILY FOR LEG AND BACK PAINS (Patient taking differently: Take 60 mg by mouth daily. ) 30 capsule 1  . furosemide (LASIX) 40 MG tablet TAKE ONE TABLET BY MOUTH DAILY . TAKE EXTRA TABLET FOR TWO DAYS AS NEEDED FOR INCREASE LOWER EXTREMITY SWELLING (Patient taking differently: Take 40 mg by mouth See admin instructions. TAKE ONE TABLET BY MOUTH DAILY . TAKE EXTRA TABLET FOR TWO DAYS AS NEEDED FOR INCREASE LOWER EXTREMITY SWELLING) 40 tablet 2  . gabapentin (NEURONTIN) 300 MG capsule Take 1 capsule (300 mg total) by mouth 2 (two) times daily. 60 capsule 6  . glipiZIDE (GLUCOTROL XL) 10 MG 24 hr tablet Take 1 tablet (10 mg total) by mouth daily with breakfast. 30 tablet 6  . glucose blood (ACCU-CHEK GUIDE) test strip Use as instructed to check blood sugar 3 times daily. E11.14 (Patient taking differently: 1 each by Other route See admin instructions. Use as instructed to check blood sugar 3 times daily. E11.14) 100 each 11  . hydrALAZINE (APRESOLINE) 10 MG tablet TAKE ONE TABLET BY MOUTH TWICE A DAY (Patient taking differently: Take 10 mg by mouth in the morning and at bedtime. ) 60 tablet 2  . labetalol (NORMODYNE) 100 MG tablet TAKE ONE TABLET BY MOUTH TWICE A DAY (Patient taking differently: Take 100 mg by mouth 2 (two) times daily. ) 60 tablet 2  . Lancets Misc. (ACCU-CHEK SOFTCLIX LANCET DEV) KIT Use as directed (Patient taking differently: 1 each by Other route as directed. ) 1 kit 0  . Lancets MISC Use as instructed to check blood sugar 3 times daily. E11.14 (Patient taking differently: 1 each by Other route See admin instructions. Use as instructed to check blood sugar 3 times daily. E11.14) 100 each 11  . lisinopril (ZESTRIL) 40 MG tablet Take 1 tablet (40 mg total) by mouth daily. 30 tablet 6  . loratadine (CLARITIN) 10 MG tablet TAKE ONE TABLET BY MOUTH DAILY AS NEEDED FOR ALLERGIES (Patient taking differently: Take 10 mg by mouth daily as needed for allergies. ) 30  tablet 5  . metFORMIN (GLUCOPHAGE) 1000 MG tablet TAKE ONE TABLET BY MOUTH TWICE A DAY WITH A MEAL. (Patient taking differently: Take 1,000 mg by mouth 2 (two) times daily with a meal. ) 180 tablet 1  . vitamin B-12 (CYANOCOBALAMIN) 500 MCG tablet Take 1 tablet (500 mcg total) by mouth daily. 100 tablet 1    Drug Regimen Review Drug regimen was reviewed and remains appropriate with no significant issues identified  Home: Home Living Family/patient expects to be discharged to:: Private residence Living Arrangements: Alone Available Help at Discharge: Personal care attendant Type of Home: Apartment Home Access: Level entry [place]: One level Bathroom Shower/Tub: Chiropodist: Standard Bathroom Accessibility: Yes Home Equipment: [place] - single point, Walker - 2 wheels, Bedside commode Additional Comments: pt reported aide comes 7 days/week for 2.25 hours  Lives With: Alone   Functional History: Prior Function Level of Independence: Needs assistance Gait / Transfers Assistance Needed: Walker and cane for gait. Walker when out of home. ADL's / Homemaking Assistance Needed: reports aides assist with IADLs, showers, and dressing; reports does tolieting but sometimes not able to get to bathroom in time Comments: Has aide 7 days a week for about 2 hours in am; Pt reports weakness/numbness in extremities (L worse than right) chronically but has worsened over the past few weeks  Functional Status:  Mobility: Bed Mobility Overal bed mobility: Needs Assistance Bed Mobility: Rolling, Sidelying to Sit Rolling: Min assist Sidelying to sit: Mod assist Supine to sit: Min assist, Mod assist Sit to supine: Mod assist General bed mobility comments: Educated on log roll technique, pillow to maintain neutral. Min assist rolling towards left with hand on rail for support. Mod assist for truncal support boosting to seated position. Transfers Overall transfer level: Needs  assistance Equipment used: Rolling walker (2 wheeled) Transfer via Lift Equipment: Marketing executive Transfers: Sit to/from Stand, Risk manager Sit to Stand: Min assist, From elevated surface Stand pivot transfers: Min assist General transfer comment: Performed with mod assist initially progressed to min assist with bed elevated. Educated on technique. Able to keep hands on RW, pushing primarily with LEs. Pivot to recliner with min assist for RW navigation. Ambulation/Gait Ambulation/Gait assistance: Min assist, +2 safety/equipment, Mod assist Gait Distance (Feet): 25 Feet Assistive device:  ([name]) Gait Pattern/deviations: Step-to pattern, Decreased stride length, Shuffle, Ataxic, Narrow base of support, Drifts right/left General Gait Details: Pt was able to progress steps in [place] plus walking to door and peeked into hallway.  Left the knee pad in place to prevent buckling.  Pt took small steps but almost had to due to the [place] plus set up.  Gait velocity: decreased Gait velocity interpretation: <1.31 ft/sec, indicative of household ambulator    ADL: ADL Overall ADL's : Needs assistance/impaired Eating/Feeding: Set up, Sitting Eating/Feeding Details (indicate cue type and reason): requires setup to open containers end of session  Grooming: Wash/dry face, Min guard, Sitting Grooming Details (indicate cue type and reason): pt requiring use of bil hands to perform task given UE weakness  Upper Body Bathing: Sitting, Minimal assistance Lower Body Bathing: Maximal assistance, Sitting/lateral leans, Sit to/from stand Upper Body Dressing : Minimal assistance, Sitting Lower Body Dressing: Maximal assistance, Sit to/from stand, Sitting/lateral leans Toilet Transfer: Minimal assistance, +2 for safety/equipment, BSC, Stand-pivot, Cueing for safety Toilet Transfer Details (indicate cue type and reason): to recliner, +2 for safety due to LE buckling, decreased eccentric control Toileting-  Clothing Manipulation and Hygiene: Maximal assistance, Sit to/from stand Toileting - Clothing Manipulation Details (indicate cue type and reason): pt attempting to use urinal at EOB, supervision for balance  Functional mobility during ADLs: Minimal assistance, +2 for safety/equipment, Rolling walker, Cueing for safety General ADL Comments: pt tolerating EOB activity today including seated ADL and UB/LB AROM  Cognition: Cognition Overall Cognitive Status: Impaired/Different from baseline Orientation Level: Oriented to person, Oriented to place, Oriented to time, Oriented to situation Cognition Arousal/Alertness: Awake/alert Behavior  During Therapy: WFL for tasks assessed/performed Overall Cognitive Status: Impaired/Different from baseline Area of Impairment: Memory Memory: Decreased recall of precautions, Decreased short-term memory General Comments: Calling nurses station, forgetting why.  Physical Exam: Blood pressure (!) 148/55, pulse 65, temperature (!) 97.5 F (36.4 C), temperature source Oral, resp. rate 20, height _0  (1.905 m), weight 109.2 kg, SpO2 100 %. Physical Exam Vitals and nursing note reviewed.  Constitutional:      Appearance: He is obese.     Comments: Pt sitting up in bed- has lunch delivered; set him up after exam; appropriate, NAD  HENT:     Head: Normocephalic and atraumatic.     Comments: Smile equal; tongue midline    Right Ear: External ear normal.     Left Ear: External ear normal.     Nose: Nose normal. No congestion.     Mouth/Throat:     Mouth: Mucous membranes are dry.     Pharynx: Oropharynx is clear. No oropharyngeal exudate.  Eyes:     General:        Right eye: No discharge.        Left eye: No discharge.     Extraocular Movements: Extraocular movements intact.  Neck:     Comments: Soft cervical collar per PA note Wasn't wearing  One When I was there Cervical spine- tight scalenes and posterior cervical incision- staples intact- looks  great- no erythema Cardiovascular:     Comments: RRR- no M/R/G Pulmonary:     Comments: CTA B/L- no W/R/R- good air movement B/L Abdominal:     Comments: Soft, protuberant, NT, slightly distended?; hyperactive  Genitourinary:    Comments: Condom catheter in place- medium to dark amber urine in container Musculoskeletal:     Comments: UEs- biceps 5-/5, WE 4+/5, triceps 4-/5, grip 4-/5, finger abd 3+/5 B/L LEs- R HF 4+/5; L HF 4/5- otherwise 4+/5 in KE, DF, PF and EHL B/L  Skin:    Comments: No heel bogginess B/L- posterior incision of neck looks great as above  Neurological:     Comments: Patient is alert no acute distress.  Oriented x3 and follows commands.  Fair awareness of deficits.  Decreased sensation to light touch in L1 to S2 B/L- otherwise intact in UEs and torso B/L No clonus B/L Hoffman's (+) B/L MAS of 1 in LUE and LEs- MAS of 0 in RUE  Psychiatric:     Comments: Appropriate, but flat     Results for orders placed or performed during the hospital encounter of 02/25/20 (from the past 48 hour(s))  Glucose, capillary     Status: Abnormal   Collection Time: 03/05/20  2:34 PM  Result Value Ref Range   Glucose-Capillary 120 (H) 70 - 99 mg/dL    Comment: Glucose reference range applies only to samples taken after fasting for at least 8 hours.  Type and screen [place]     Status: None   Collection Time: 03/05/20  3:18 PM  Result Value Ref Range   ABO/RH(D) AB POS    Antibody Screen NEG    Sample Expiration      03/08/2020,2359 Performed at [facility], [place] [address] [place], [place] [address]   Aerobic Culture (superficial specimen)     Status: None (Preliminary result)   Collection Time: 03/05/20  4:52 PM   Specimen: Abscess  Result Value Ref Range   Specimen Description ABSCESS    Special Requests POSTERIOR CERVICAL    Gram  Stain NO WBC SEEN NO ORGANISMS SEEN     Culture      NO GROWTH 2 DAYS Performed at [facility],  [place] [address] [place], [place] [address]    Report Status PENDING   Glucose, capillary     Status: Abnormal   Collection Time: 03/05/20  6:35 PM  Result Value Ref Range   Glucose-Capillary 120 (H) 70 - 99 mg/dL    Comment: Glucose reference range applies only to samples taken after fasting for at least 8 hours.  Glucose, capillary     Status: Abnormal   Collection Time: 03/05/20 10:09 PM  Result Value Ref Range   Glucose-Capillary 179 (H) 70 - 99 mg/dL    Comment: Glucose reference range applies only to samples taken after fasting for at least 8 hours.   Comment 1 Notify RN    Comment 2 Document in Chart   Glucose, capillary     Status: Abnormal   Collection Time: 03/06/20  6:05 AM  Result Value Ref Range   Glucose-Capillary 304 (H) 70 - 99 mg/dL    Comment: Glucose reference range applies only to samples taken after fasting for at least 8 hours.  Glucose, capillary     Status: Abnormal   Collection Time: 03/06/20 12:05 PM  Result Value Ref Range   Glucose-Capillary 154 (H) 70 - 99 mg/dL    Comment: Glucose reference range applies only to samples taken after fasting for at least 8 hours.  Glucose, capillary     Status: Abnormal   Collection Time: 03/06/20  4:17 PM  Result Value Ref Range   Glucose-Capillary 141 (H) 70 - 99 mg/dL    Comment: Glucose reference range applies only to samples taken after fasting for at least 8 hours.  Glucose, capillary     Status: Abnormal   Collection Time: 03/06/20  9:07 PM  Result Value Ref Range   Glucose-Capillary 267 (H) 70 - 99 mg/dL    Comment: Glucose reference range applies only to samples taken after fasting for at least 8 hours.   Comment 1 Notify RN    Comment 2 Document in Chart   Comprehensive metabolic panel     Status: Abnormal   Collection Time: 03/07/20  3:16 AM  Result Value Ref Range   Sodium 133 (L) 135 - 145 mmol/L   Potassium 4.8 3.5 - 5.1 mmol/L   Chloride 103 98 - 111 mmol/L   CO2 21 (L) 22 - 32 mmol/L   Glucose, Bld 323  (H) 70 - 99 mg/dL    Comment: Glucose reference range applies only to samples taken after fasting for at least 8 hours.   BUN 39 (H) 8 - 23 mg/dL   Creatinine, Ser 1.05 0.61 - 1.24 mg/dL   Calcium 8.6 (L) 8.9 - 10.3 mg/dL   Total Protein 5.7 (L) 6.5 - 8.1 g/dL   Albumin 2.9 (L) 3.5 - 5.0 g/dL   AST 13 (L) 15 - 41 U/L   ALT 16 0 - 44 U/L   Alkaline Phosphatase 53 38 - 126 U/L   Total Bilirubin 0.7 0.3 - 1.2 mg/dL   GFR calc non Af Amer >60 >60 mL/min   GFR calc Af Amer >60 >60 mL/min   Anion gap 9 5 - 15    Comment: Performed at [facility] [address], [place],  [address]  CBC     Status: Abnormal   Collection Time: 03/07/20  3:16 AM  Result  Value Ref Range   WBC 18.9 (H) 4.0 - 10.5 K/uL   RBC 4.71 4.22 - 5.81 MIL/uL   Hemoglobin 12.7 (L) 13.0 - 17.0 g/dL   HCT 39.2 39 - 52 %   MCV 83.2 80.0 - 100.0 fL   MCH 27.0 26.0 - 34.0 pg   MCHC 32.4 30.0 - 36.0 g/dL   RDW 13.8 11.5 - 15.5 %   Platelets 247 150 - 400 K/uL   nRBC 0.0 0.0 - 0.2 %    Comment: Performed at [facility], [place] [address] [place], [place] [address]  ABO/Rh     Status: None   Collection Time: 03/07/20  3:16 AM  Result Value Ref Range   ABO/RH(D)      AB POS Performed at [facility] [address] [place], [place] [address]   Glucose, capillary     Status: Abnormal   Collection Time: 03/07/20  6:08 AM  Result Value Ref Range   Glucose-Capillary 308 (H) 70 - 99 mg/dL    Comment: Glucose reference range applies only to samples taken after fasting for at least 8 hours.   Comment 1 Notify RN    Comment 2 Document in Chart   Glucose, capillary     Status: Abnormal   Collection Time: 03/07/20 11:49 AM  Result Value Ref Range   Glucose-Capillary 282 (H) 70 - 99 mg/dL    Comment: Glucose reference range applies only to samples taken after fasting for at least 8 hours.   Comment 1 Notify RN    Comment 2 Document in Chart    DG Cervical Spine 2-3 Views  Result Date:  03/05/2020 CLINICAL DATA:  Surgery, elective. C3-6 posterior fusion. Fluoroscopy time 12 seconds, 3.45 mGy EXAM: CERVICAL SPINE - 2-3 VIEW; DG C-ARM 1-60 MIN COMPARISON:  MRI of the cervical spine 02/26/2020 FINDINGS: Three intraoperative fluoroscopic images of the cervical spine are submitted. A posterior spinal fusion construct is now present at the C3-C6 levels. Superimposition of the shoulders limits evaluation of the C4 and more caudal levels on the lateral view images. Additionally, overlying artifact partially obscures the lower aspect of the right-sided fusion hardware on the PA image. Within these limitations, there is no unexpected finding. Partially visualized support tubes. IMPRESSION: Three intraoperative fluoroscopic images of the cervical spine as above. Electronically Signed   By: Kellie Simmering DO   On: 03/05/2020 18:13   DG C-Arm 1-60 Min  Result Date: 03/05/2020 CLINICAL DATA:  Surgery, elective. C3-6 posterior fusion. Fluoroscopy time 12 seconds, 3.45 mGy EXAM: CERVICAL SPINE - 2-3 VIEW; DG C-ARM 1-60 MIN COMPARISON:  MRI of the cervical spine 02/26/2020 FINDINGS: Three intraoperative fluoroscopic images of the cervical spine are submitted. A posterior spinal fusion construct is now present at the C3-C6 levels. Superimposition of the shoulders limits evaluation of the C4 and more caudal levels on the lateral view images. Additionally, overlying artifact partially obscures the lower aspect of the right-sided fusion hardware on the PA image. Within these limitations, there is no unexpected finding. Partially visualized support tubes. IMPRESSION: Three intraoperative fluoroscopic images of the cervical spine as above. Electronically Signed   By: Kellie Simmering DO   On: 03/05/2020 18:13       Medical Problem List and Plan: 1.  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   Cervical collar at all times  -patient may  Shower with covering  incision  -ELOS/Goals: min A to Mod I, 3 weeks 2.  Antithrombotics: -DVT/anticoagulation: SCDs.  Check vascular study -will check if NSU will allow pt to have Lovenox due to increased risk of DVT/PE as SCI pt- has been 6 days since surgery on 8/13  -antiplatelet therapy: N/A 3. Pain Management: Neurontin 300 mg twice daily, baclofen 5 mg 3 times daily, Cymbalta 60 mg daily, oxycodone as needed  4. Mood: Provide emotional support  -antipsychotic agents: N/A 5. Neuropsych: This patient is capable of making decisions on his own behalf. 6. Skin/Wound Care: Routine skin checks 7. Fluids/Electrolytes/Nutrition: Routine in and outs with follow-up chemistries 8.  Hypertension.  Hydralazine 25 mg twice daily, labetalol 100 mg twice daily, lisinopril 40 mg daily.  Monitor with increased mobility 9.  Diabetes mellitus with peripheral neuropathy.  Hemoglobin A1c 7.1.  NovoLog 12 units 3 times daily with meals, Lantus insulin 55 units daily.  Check blood sugars before meals and at bedtime.  Diabetic teaching 10.  CKD stage II.  Creatinine baseline 1.46.  Follow-up chemistries 11.  Morbid obesity.  BMI 30.09.  Dietary follow-up 12.  History of alcohol tobacco cocaine use.  Providing counseling 13.  Diastolic congestive heart failure.  Lasix 40 mg daily.  Monitor with increased mobility 14.  Hyperlipidemia.  Lipitor 15. Neurogenic bowel and bladder- goes every 1 week - which is likely the slowing of neurogenic bowel - need to improve speed; also has urinary urgency, which is also neurogenic- will monitor and treat as required.  16. Spasticity- on Baclofen 24m TID- adequately controlled- mainly annoying, not painful- will monitor muscle spasms.    DLavon PaganiniAngiulli, PA-C 03/07/2020    I have personally performed a face to face diagnostic evaluation of this patient and formulated the key components of the plan.  Additionally, I have  personally reviewed laboratory data, imaging studies, as well as relevant notes and concur with the physician assistant's documentation above.   The patient's status has not changed from the original H&P.  Any changes in documentation from the acute care chart have been noted above.

## 2020-03-07 NOTE — PMR Pre-admission (Signed)
PMR Admission Coordinator Pre-Admission Assessment  Patient: [name] is an 59 y.o., male MRN: [id number] DOB: 1960-10-30 Height: 6\' 3"  (190.5 cm) Weight: 112.1 kg              Insurance Information HMO:     PPO:      PCP:      IPA:      80/20: yes     OTHER:  PRIMARY: Medicare A & B      Policy#: [id number]      Subscriber: patient CM Name:       Phone#:      Fax#:  Pre-Cert#:       Employer:  Benefits:  Phone #: verified eligibility via [place] on 03/07/20     Name:  Eff. Date: Part A & B effective 06/23/2016     Deduct: $1,484       Out of Pocket Max: NA      Life Max: NA  CIR: 100% with Medicare approval      SNF: 100% days 1-20, 80% days 21-100 Outpatient: 80%     Co-Pay: 20% Home Health: 100%      Co-Pay:  DME: 80%     Co-Pay: 20% Providers: pt's choice  SECONDARY: Medicaid [place] Access       Policy#: [id number]      Phone#: [phone]  Financial Counselor:       Phone#:   The "Data Collection Information Summary" for patients in Inpatient Rehabilitation Facilities with attached "Privacy Act [place] Records" was provided and verbally reviewed with: Patient and Family  Emergency Contact Information Contact Information    Name Relation Home Work Mobile   [place] Sister [phone]     Surgeon,Shirley Relative [phone]       Current Medical History  Patient Admitting Diagnosis: 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 Sherley Bounds with conservative care.  Per chart review patient lives alone.  1 level apartment.  Has an aide 3 days a week for 2 hours at a time used a walker and cane prior to admission.  Aide does assist with some ADLs.  Presented 02/26/2020 progressive weakness/gait instability lower extremities left greater than right as well as numbness and spasticity with stool incontinence.  Admission chemistries unremarkable except glucose 209, BUN 26, creatinine 1.46, hemoglobin 14.7, hemoglobin A1c 7.1, SARS coronavirus negative.  Recent CTs and brain MRIs demonstrated diffuse atrophy negative for acute infarct.  X-rays and imaging revealed severe spinal stenosis/myelopathy at C3-4, C4-5.  Patient underwent decompressive cervical laminectomy and medial facetectomy C3-4 4-5 with posterior cervical arthrodesis C3-C6 with lateral mass fixation 03/05/2020 per Dr. Sherley Bounds.  Cervical soft collar as directed.  Decadron protocol initiated.  Tolerating a regular consistency diet.  Therapy evaluations completed and patient to be admitted for a comprehensive inpatient rehab program.   Glasgow Coma Scale Score: 15  Past Medical History  Past Medical History:  Diagnosis Date  . Alcohol abuse    11-15-2017  per pt last alcohol Dec 2018  . BPH with obstruction/lower urinary tract symptoms   .  Chronic arm pain   . Chronic pain    arms, leg, back  . CKD (chronic kidney disease), stage II   . Cocaine abuse ([place])    11-15-2017  per pt last used March 2019  . Congenital hydrocephalus, unspecified (HCC)    slow progression  . Diabetic retinopathy of both eyes ([place])   . Diastolic CHF, chronic ([place]) 07/2017  . Gait instability    multifactorial -- slow worsening congenital hydrocephalus, peripheral neuropathy, left C5-6 cord lesion  . Hematuria   . History of acute pulmonary edema 07/2017  . History of spinal cord injury 01/02/2014   pt fell, caused spinal cord contusion at C5-6--  residual  left side weakness    . 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 History of TIA (transient ischemic attack) and stroke    hx cva 12-20-2010 and  TIA 02-22-2011--- no residual's from cva or tia  . Hyperlipidemia   . Hypertension    followed by pcp  . 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 Lesion of bladder   . Peripheral edema    chronic LUE  . Peripheral neuropathy   . Type 2 diabetes mellitus ([place])    followed by dr Karle Plumber--  last A1c 6.8 on 11-06-2017  . Weak urinary stream     Family History  family history includes Cancer in his mother; Diabetes in his mother; Heart disease in his father; Hypertension in his father, mother, and sister; Stroke in his father.  Prior Rehab/Hospitalizations:  Has the patient had prior rehab or hospitalizations prior to admission? No  Has the patient had major surgery during 100 days prior to admission? Yes  Current Medications   Current Facility-Administered Medications:  .  0.9 %  sodium chloride infusion, 250 mL, Intravenous, Continuous, Eustace Moore, MD, Last Rate: 1 mL/hr at 03/05/20 2337, 250 mL at 03/05/20 2337 .  acetaminophen (TYLENOL) tablet 650 mg, 650 mg, Oral, Q4H PRN **OR** acetaminophen (TYLENOL) suppository 650 mg, 650 mg, Rectal, Q4H PRN, Eustace Moore, MD .  atorvastatin (LIPITOR) tablet 40 mg, 40 mg, Oral, Daily, Eustace Moore, MD, 40 mg at 03/11/20 0951 .  baclofen (LIORESAL) tablet 5 mg, 5 mg, Oral, TID, Eustace Moore, MD, 5 mg at 03/11/20 0951 .  dexamethasone (DECADRON) injection 1 mg, 1 mg, Intravenous, Q8H **OR** dexamethasone (DECADRON) tablet 1 mg, 1 mg, Oral, Q8H, Eustace Moore, MD .  DULoxetine (CYMBALTA) DR capsule 60 mg, 60 mg, Oral, Daily, Eustace Moore, MD, 60 mg at 03/11/20 0951 .  furosemide (LASIX) tablet 40 mg, 40 mg, Oral, Daily, Eustace Moore, MD, 40 mg at 03/11/20 0951 .  gabapentin (NEURONTIN)  capsule 300 mg, 300 mg, Oral, BID, Eustace Moore, MD, 300 mg at 03/11/20 0951 .  hydrALAZINE (APRESOLINE) tablet 25 mg, 25 mg, Oral, BID, Eustace Moore, MD, 25 mg at 03/11/20 0954 .  insulin aspart (novoLOG) injection 0-20 Units, 0-20 Units, Subcutaneous, TID WC, Donne Hazel, MD, 4 Units at 03/11/20 0804 .  insulin aspart (novoLOG) injection 0-5 Units, 0-5 Units, Subcutaneous, QHS, Donne Hazel, MD, 2 Units at 03/10/20 2225 .  insulin aspart (novoLOG) injection 12 Units, 12 Units, Subcutaneous, TID WC, Donne Hazel, MD, 12 Units at 03/11/20 [phone]  insulin glargine (LANTUS) injection 55 Units, 55 Units, Subcutaneous, Daily, Donne Hazel, MD, 55 Units at 03/11/20 0951 .  labetalol (NORMODYNE) tablet 100 mg, 100 mg, Oral, BID, Eustace Moore, MD, 100 mg at 03/11/20 0951 .  lisinopril (ZESTRIL) tablet 40 mg, 40 mg, Oral, Daily, Eustace Moore, MD, 40 mg at 03/11/20 0950 .  menthol-cetylpyridinium (CEPACOL) lozenge 3 mg, 1 lozenge, Oral, PRN **OR** phenol (CHLORASEPTIC) mouth spray 1 spray, 1 spray, Mouth/Throat, PRN, Eustace Moore, MD .  morphine 2 MG/ML injection 2 mg, 2 mg, Intravenous, Q2H PRN, Eustace Moore, MD, 2 mg at 03/05/20 2338 .  ondansetron (ZOFRAN) tablet 4 mg, 4 mg, Oral, Q6H PRN **OR** ondansetron (ZOFRAN) injection 4 mg, 4 mg, Intravenous, Q6H PRN, Eustace Moore, MD .  oxyCODONE (Oxy IR/ROXICODONE) immediate release tablet 10 mg, 10 mg, Oral, Q3H PRN, Eustace Moore, MD, 10 mg at 03/11/20 2440 .  senna (SENOKOT) tablet 8.6 mg, 1 tablet, Oral, BID, Eustace Moore, MD, 8.6 mg at 03/11/20 0951 .  sodium chloride flush (NS) 0.9 % injection 3 mL, 3 mL, Intravenous, Q12H, Eustace Moore, MD, 3 mL at 03/11/20 0953 .  sodium chloride flush (NS) 0.9 % injection 3 mL, 3 mL, Intravenous, PRN, Eustace Moore, MD  Patients Current Diet:  Diet Order            Diet heart healthy/carb modified Room service appropriate? Yes; Fluid consistency: Thin  Diet effective now                  Precautions / Restrictions Precautions Precautions: Cervical Precaution Booklet Issued: Yes (comment) Precaution Comments: reviewed precautions with patinet handout in room Cervical Brace: Soft collar (pt reports it doesn't help, so did not donn) Restrictions Weight Bearing Restrictions: No   Has the patient had 2 or more falls or a fall with injury in the past year?No  Prior Activity Level Household: goes to doctor's appointments every 3 months  Prior Functional Level Prior Function Level of Independence: Needs assistance Gait / Transfers Assistance Needed: Walker and cane for gait. Walker when out of home. ADL's / Homemaking Assistance Needed: reports aides assist with IADLs, showers, and dressing; reports does tolieting but sometimes not able to get to bathroom in time Comments: Has aide 7 days a week for about 2 hours in am; Pt reports weakness/numbness in extremities (L worse than right) chronically but has worsened over the past few weeks  Self Care: Did the patient need help bathing, dressing, using the toilet or eating?  Needed some help  Indoor Mobility: Did the patient need assistance with walking from room to room (with or without device)? Independent  Stairs: Did the patient need assistance with internal or external stairs (with or without device)? Needed some help  Functional Cognition: Did the patient need help planning regular tasks such as shopping or remembering to take medications? Pt reported can remember to take medications. Pt also reported that he rarely leaves house.  Home Assistive Devices / Equipment Home Assistive Devices/Equipment: C-collar, CBG Meter, Walker (specify type) Home Equipment: Cane - single point, Walker - 2 wheels, Bedside commode  Prior Device Use: Indicate devices/aids used by the patient prior to current illness, exacerbation or injury? Walker  Current Functional Level Cognition  Overall Cognitive Status: No family/caregiver  present to determine baseline cognitive functioning Orientation Level: Oriented X4 Following Commands: Follows one step commands consistently, Follows one step commands with increased time, Follows multi-step commands consistently General Comments: unsure of cognitive baseline.  Answered questions normally, a bit slow to process, but not significantly.  Extremity Assessment (includes Sensation/Coordination)  Upper Extremity Assessment: RUE deficits/detail, LUE deficits/detail RUE Deficits / Details: grossly 3-/5, assessed functionally (to 90* FF only) RUE Sensation: WNL RUE Coordination: decreased fine motor, decreased gross motor LUE Deficits / Details: grossly 3-/5, assessed functionally (to 90* FF only) LUE Sensation: WNL LUE Coordination: decreased fine motor, decreased gross motor  Lower Extremity Assessment: Defer to PT evaluation RLE Deficits / Details: Greater strength distally than proximal. Knee flexion, hip flexion, hip abduction grossly 3-4/5 RLE Sensation: decreased light touch (can feel but diminished and with tingling throughtout LE) LLE Deficits / Details: Greater strength distally than proximal. Knee flexion, hip flexion, hip abduction grossly 3-4/5 LLE Sensation: decreased light touch (can feel but diminished and with tingling throughtout LE)    ADLs  Overall ADL's : Needs assistance/impaired Eating/Feeding: Set up, Sitting Eating/Feeding Details (indicate cue type and reason): able to open pancake syrup but otherwise needs assist to open packages and cut food  Grooming: Wash/dry hands, Set up, Sitting, Wash/dry face Grooming Details (indicate cue type and reason): pt requiring use of bil hands to perform task given UE weakness  Upper Body Bathing: Minimal assistance, Sitting Lower Body Bathing: +2 for physical assistance, +2 for safety/equipment, Sit to/from stand, Maximal assistance Upper Body Dressing : Minimal assistance, Sitting Lower Body Dressing: +2 for  physical assistance, +2 for safety/equipment, Total assistance, Sit to/from stand Toilet Transfer: Moderate assistance, +2 for physical assistance, +2 for safety/equipment, Ambulation, BSC, RW Toilet Transfer Details (indicate cue type and reason): to recliner, +2 for safety due to LE buckling, decreased eccentric control Toileting- Clothing Manipulation and Hygiene: Total assistance, +2 for physical assistance, +2 for safety/equipment, Sit to/from stand Toileting - Clothing Manipulation Details (indicate cue type and reason): incontinent of BM during minimal ambulation, total assist for hygiene  Functional mobility during ADLs: Moderate assistance, Minimal assistance, +2 for safety/equipment, +2 for physical assistance, Rolling walker, Cueing for safety General ADL Comments: pt limited by neck pain, generalized weakness and impaired balance    Mobility  Overal bed mobility: Needs Assistance Bed Mobility: Rolling, Sidelying to Sit Rolling: Min assist Sidelying to sit: Mod assist Supine to sit: Min assist, Mod assist Sit to supine: Mod assist Sit to sidelying: Max assist, +2 for physical assistance General bed mobility comments: Min assist to help pt reach for bed rail and roll to his left, cues for log roll including knee flexion.  Pt needed mod assist to support trunk to come up to sitting EOB.  Pt stopped half way up due to pain.     Transfers  Overall transfer level: Needs assistance Equipment used: Rolling walker (2 wheeled) Transfer via Lift Equipment: Marketing executive Transfers: Sit to/from Stand, Technical brewer Transfers Sit to Stand: Mod assist, From elevated surface Stand pivot transfers: Mod assist, From elevated surface General transfer comment: Sit to stand to RW from elevated bed mod assist with one leg blocked for safety.  Arm rest on chair down and pt taking pivotal steps to the chair.  He has difficulty lifting feet instead of sliding feet and had uncontrolled descent without the  ability to reach back to the chair.      Ambulation / Gait / Stairs / Wheelchair Mobility  Ambulation/Gait Ambulation/Gait assistance: Min assist, +2 safety/equipment Gait Distance (Feet): 5 Feet Assistive device: Rolling walker (2 wheeled) ([name]) Gait Pattern/deviations: Step-to pattern, Decreased stride length, Shuffle, Ataxic, Narrow base of support, Drifts right/left General Gait Details: would be much safer with chair to follow and second  person assisting.  Gait velocity: decreased Gait velocity interpretation: <1.31 ft/sec, indicative of household ambulator    Posture / Balance Dynamic Sitting Balance Sitting balance - Comments: needs bil UE supported to maintain sitting, otherwise he leans left.  Balance Overall balance assessment: Needs assistance Sitting-balance support: Feet supported, Bilateral upper extremity supported Sitting balance-Leahy Scale: Poor Sitting balance - Comments: needs bil UE supported to maintain sitting, otherwise he leans left.  Postural control: Left lateral lean Standing balance support: Bilateral upper extremity supported Standing balance-Leahy Scale: Poor Standing balance comment: needs external support from RW and therapist, cues to extend head neck, trunk, hips and knees in standing.     Special needs/care consideration Continuous Drip IV  0.9% sodium chloride infusion: 77mL/hr; 0.9% NaCl with KCl 20 mEq/L infusion: 75 mL/hr, Skin surgical incision: cervical, Urinary catheter, Bowel incontinence, Hemovac: posterior neck accordion and Diabetic management Lantus 40 units daily; novoLOG 8 units 3x daily with meals; novoLOG 0-5 units daily at bedtime; novoLOG 0-15  units 3x daily with meals     Previous Home Environment (from acute therapy documentation) Living Arrangements: Alone  Lives With: Alone Available Help at Discharge: Personal care attendant Type of Home: Apartment Home Layout: One level Home Access: Level entry Bathroom Shower/Tub:  Chiropodist: Standard Bathroom Accessibility: Yes How Accessible: Accessible via walker [place]: Yes Type of Home Care Services: [place] (if known): pt cannot recall  Additional Comments: pt reported aide comes 7 days/week for 2.25 hours  Discharge Living Setting Plans for Discharge Living Setting: Patient's home Type of Home at Discharge: Apartment Discharge Home Layout: One level Discharge Home Access: Level entry Discharge Bathroom Shower/Tub: Tub/shower unit Discharge Bathroom Toilet: Standard Discharge Bathroom Accessibility: Yes How Accessible: Accessible via walker Does the patient have any problems obtaining your medications?: No  Social/Family/Support Systems Patient Roles: Other (Comment) (Has a sister.) Contact Information: Newt [place] - sister - [phone] Anticipated Caregiver: self - has mod I goals Caregiver Availability: Intermittent Discharge Plan Discussed with Primary Caregiver: Yes (spoke with his sister, Dorena Cookey) Is Caregiver In Agreement with Plan?: Yes Does Caregiver/Family have Issues with Lodging/Transportation while Pt is in Rehab?: No   Goals Patient/Family Goal for Rehab: Mod I: PT/OT, I: ST Expected length of stay: 20-22 days Cultural Considerations: None Pt/Family Agrees to Admission and willing to participate: Yes Program Orientation Provided & Reviewed with Pt/Caregiver Including Roles  & Responsibilities: Yes   Decrease burden of Care through IP rehab admission: Decrease number of caregivers, Bowel and bladder program, Patient/family education and Other if patient is unable to reach mod I goals for safe discharge home   Possible need for SNF placement upon discharge: Yes, SNF is planned at discharge d/t lack of social supports at the time of DC.   Patient Condition: This patient's medical and functional status has changed since the consult dated: 03/06/20 in which the  Rehabilitation Physician determined and documented that the patient's condition is appropriate for intensive rehabilitative care in an inpatient rehabilitation facility. See "History of Present Illness" (above) for medical update. Functional changes are: Currently requiring mod Assist for transfers and min to mod assist for most ADLs. Patient's medical and functional status update has been discussed with the Rehabilitation physician and patient remains appropriate for inpatient rehabilitation. Will admit to inpatient rehab today.  Preadmission Screen Completed By:  Retta Diones, RN, 03/11/2020 12:15 PM ______________________________________________________________________   Discussed status with Dr. Naaman Plummer and D. Lavorn on 03/11/20 at 1000 am  and received approval for admission today.  Admission Coordinator:  Retta Diones, time 1209/Date 03/11/20

## 2020-03-07 NOTE — Progress Notes (Signed)
Inpatient Rehab Admissions:  Inpatient Rehab Consult received.  I met with patient at the bedside for rehabilitation assessment and to discuss goals and expectations of an inpatient rehab admission.  Pt acknowledged understanding of goals and expectations.  Pt gave permission to contact sister, Victoria Euceda. Called and left message for [place].  Pending appropriate dispo, pt appears to be an appropriate candidate for potential admission to CIR.  Will continue to monitor pt's progress with therapies and medical workup.  Signed: Gayland Curry, [place], [place] Admissions Coordinator [phone]

## 2020-03-08 LAB — AEROBIC CULTURE W GRAM STAIN (SUPERFICIAL SPECIMEN)
Culture: NO GROWTH
Gram Stain: NONE SEEN

## 2020-03-08 LAB — GLUCOSE, CAPILLARY
Glucose-Capillary: 217 mg/dL — ABNORMAL HIGH (ref 70–99)
Glucose-Capillary: 196 mg/dL — ABNORMAL HIGH (ref 70–99)
Glucose-Capillary: 251 mg/dL — ABNORMAL HIGH (ref 70–99)
Glucose-Capillary: 210 mg/dL — ABNORMAL HIGH (ref 70–99)

## 2020-03-08 MED ORDER — INSULIN ASPART 100 UNIT/ML ~~LOC~~ SOLN
12.0000 [IU] | Freq: Three times a day (TID) | SUBCUTANEOUS | Status: DC
  Administered 2020-03-08 – 2020-03-11 (×11): 12 [IU] via SUBCUTANEOUS

## 2020-03-08 MED ORDER — INSULIN GLARGINE 100 UNIT/ML ~~LOC~~ SOLN
55.0000 [IU] | Freq: Every day | SUBCUTANEOUS | Status: DC
  Administered 2020-03-08 – 2020-03-11 (×4): 55 [IU] via SUBCUTANEOUS
  Filled 2020-03-08 (×4): qty 0.55

## 2020-03-08 MED ORDER — INSULIN ASPART 100 UNIT/ML ~~LOC~~ SOLN
0.0000 [IU] | Freq: Three times a day (TID) | SUBCUTANEOUS | Status: DC
  Administered 2020-03-08: 11 [IU] via SUBCUTANEOUS
  Administered 2020-03-08: 4 [IU] via SUBCUTANEOUS
  Administered 2020-03-09: 7 [IU] via SUBCUTANEOUS
  Administered 2020-03-09: 11 [IU] via SUBCUTANEOUS
  Administered 2020-03-10 – 2020-03-11 (×3): 7 [IU] via SUBCUTANEOUS
  Administered 2020-03-11: 4 [IU] via SUBCUTANEOUS

## 2020-03-08 MED ORDER — BACLOFEN 10 MG PO TABS
5.0000 mg | ORAL_TABLET | Freq: Three times a day (TID) | ORAL | Status: DC
  Administered 2020-03-08 – 2020-03-11 (×11): 5 mg via ORAL
  Filled 2020-03-08 (×11): qty 1

## 2020-03-08 MED ORDER — INSULIN ASPART 100 UNIT/ML ~~LOC~~ SOLN
0.0000 [IU] | Freq: Every day | SUBCUTANEOUS | Status: DC
  Administered 2020-03-08 – 2020-03-10 (×2): 2 [IU] via SUBCUTANEOUS

## 2020-03-08 MED ORDER — DEXAMETHASONE 2 MG PO TABS
2.0000 mg | ORAL_TABLET | Freq: Four times a day (QID) | ORAL | Status: DC
  Administered 2020-03-08 – 2020-03-11 (×11): 2 mg via ORAL
  Filled 2020-03-08 (×11): qty 1

## 2020-03-08 MED ORDER — DEXAMETHASONE SODIUM PHOSPHATE 4 MG/ML IJ SOLN: 2.0000 mg | Freq: Four times a day (QID) | INTRAMUSCULAR | Status: DC

## 2020-03-08 NOTE — Progress Notes (Signed)
Inpatient Diabetes Program Recommendations  AACE/ADA: New Consensus Statement on Inpatient Glycemic Control (2015)  Target Ranges:  Prepandial:   less than 140 mg/dL      Peak postprandial:   less than 180 mg/dL (1-2 hours)      Critically ill patients:  140 - 180 mg/dL   Lab Results  Component Value Date   GLUCAP 217 (H) 03/08/2020   HGBA1C 7.1 (H) 02/26/2020    Review of Glycemic Control Results for EXCELL, NEYLAND (MRN [id number]) as of 03/08/2020 10:43  Ref. Range 03/07/2020 06:08 03/07/2020 11:49 03/07/2020 17:00 03/07/2020 22:08 03/08/2020 06:20  Glucose-Capillary Latest Ref Range: 70 - 99 mg/dL 308 (H)  Decadron 4 mg  Novolog 19 units  Lantus 40 units 282 (H)  Decadron 4 mg  Novolog 16 units 148 (H)  Decadron 4 mg  Novolog 2 units 339 (H)  Decadron 4 mg given at 0132   Novolog 4 units 217 (H)  Decadron 4 mg     Novolog 15 units given at 0702 am  Novolog 12 units given at 1006 am     Diabetes history: DM 2 Outpatient Diabetes medications: Glucotrol 10 mg Daily+ Farxiga 10 mg Daily + Metformin 1 gm bid Current orders for Inpatient glycemic control:  Lantus 55 units Novolog 0-20 units tid + hs Novolog 12 units tid meal coverage  Inpatient Diabetes Program Recommendations:    Noted decadron reduced from 4 mg to 2 mg Q6 hours. Lantus increased to 55 units. Fasting glucose 217 this am  -Noted Novolog dose given twice this am  -Consider reducing Lantus back down to 35-40 units tomorrow 8/17.  Thanks,  Tama Headings RN, MSN, BC-ADM Inpatient Diabetes Coordinator Team Pager [phone] (8a-5p)

## 2020-03-08 NOTE — Progress Notes (Signed)
Occupational Therapy Re-eval  Patient Details Name: [name] MRN: [id number] DOB: June 01, 1961 Today's Date: 03/08/2020    History of present illness Pt is 59 yo male who presented to ED with worsening numbness and weakness in bil UE and LE (reports chronic but worsened over past 3-4 weeks).  Pt's MRI revealed severe spinal stenosis C3-4, C4-5 with cord compression.  Neurosurgery has been consulted and recommended continuing PT/OT, IV steroids a few days, possible sx next week, and continuing to wear cervical collar. (8/13) s/p cervical laminectomy and medial facetectomy C3-4 C4-5, and Posterior cervical arthrodesis C3-C6.   OT comments  Patient seen for re-eval post posterior cervical surgery 8/13.  Patient currently requires min assist for bed mobility, min-mod assist +2 for sit to stand (pending surface height and fatigue) with cueing for hand placement, techniques and safety, and min assist +2 for stand pivot transfers.  Requires total assist +2 for LB dressing and toileting today, incontinent of BM during minimal in room ambulation; min assist for UB ADLs and setup assist for grooming/eating. Continues to be limited by generalized weakness, neck pain, precautions, balance, coordination and cognition (problem solving, awareness, memory).  Will follow acutely, goals remain appropriate. Continue to recommend CIR at this time.    Follow Up Recommendations  CIR    Equipment Recommendations  Other (comment) (TBD at next venue of care)    Recommendations for Other Services Rehab consult    Precautions / Restrictions Precautions Precautions: Cervical Precaution Comments: reviewed precautions with patinet  Required Braces or Orthoses: Cervical Brace Cervical Brace: Soft collar (for OOB ) Restrictions Weight Bearing Restrictions: No       Mobility Bed Mobility Overal bed mobility: Needs Assistance Bed Mobility: Rolling;Sidelying to Sit Rolling: Min guard Sidelying to sit: Min  assist;HOB elevated       General bed mobility comments: cueing for log roll technique, min assist for trunk elevation to EOB and increased time to scoot forward  Transfers Overall transfer level: Needs assistance Equipment used: Rolling walker (2 wheeled) Transfers: Sit to/from [facility] Sit to Stand: Mod assist;+2 physical assistance;+2 safety/equipment;From elevated surface Stand pivot transfers: Min assist;+2 safety/equipment       General transfer comment: min assist +2 from elevated EOB, mod assist +2 from regular height BSC/recliner; patient requires cueing for hand placement and safety, physical assist to power up and steady but once up pivoting to next surface with min assist +2 for safety     Balance Overall balance assessment: Needs assistance Sitting-balance support: No upper extremity supported Sitting balance-Leahy Scale: Fair     Standing balance support: Bilateral upper extremity supported;During functional activity Standing balance-Leahy Scale: Poor Standing balance comment: relies on RW and external support                           ADL either performed or assessed with clinical judgement   ADL Overall ADL's : Needs assistance/impaired Eating/Feeding: Set up;Sitting Eating/Feeding Details (indicate cue type and reason): able to open pancake syrup but otherwise needs assist to open packages and cut food  Grooming: Wash/dry hands;Set up;Sitting;Wash/dry face   Upper Body Bathing: Minimal assistance;Sitting   Lower Body Bathing: +2 for physical assistance;+2 for safety/equipment;Sit to/from stand;Maximal assistance   Upper Body Dressing : Minimal assistance;Sitting   Lower Body Dressing: +2 for physical assistance;+2 for safety/equipment;Total assistance;Sit to/from stand   Toilet Transfer: Moderate assistance;+2 for physical assistance;+2 for safety/equipment;Ambulation;BSC;RW   Toileting- Clothing Manipulation and Hygiene:  Total assistance;+2 for physical assistance;+2 for safety/equipment;Sit to/from stand Toileting - Clothing Manipulation Details (indicate cue type and reason): incontinent of BM during minimal ambulation, total assist for hygiene      Functional mobility during ADLs: Moderate assistance;Minimal assistance;+2 for safety/equipment;+2 for physical assistance;Rolling walker;Cueing for safety General ADL Comments: pt limited by neck pain, generalized weakness and impaired balance     Vision       Perception     Praxis      Cognition Arousal/Alertness: Awake/alert Behavior During Therapy: Flat affect Overall Cognitive Status: Impaired/Different from baseline Area of Impairment: Memory;Awareness;Problem solving;Following commands                     Memory: Decreased short-term memory;Decreased recall of precautions Following Commands: Follows one step commands consistently;Follows one step commands with increased time;Follows multi-step commands consistently   Awareness: Intellectual Problem Solving: Slow processing;Decreased initiation;Difficulty sequencing;Requires verbal cues;Requires tactile cues General Comments: patient requires 1 step cueing, decreased recall of precautions and poor awareness to deficits; incontinent of bowel with no awareness during session         Exercises     Shoulder Instructions       General Comments      Pertinent Vitals/ Pain       Pain Assessment: Faces Faces Pain Scale: Hurts even more Pain Location: neck, post op Pain Descriptors / Indicators: Guarding;Aching Pain Intervention(s): Limited activity within patient's tolerance;Monitored during session;Repositioned  Home Living Family/patient expects to be discharged to:: Private residence Living Arrangements: Alone Available Help at Discharge: Personal care attendant Type of Home: Apartment Home Access: Level entry     Home Layout: One level     Bathroom Shower/Tub:  Teacher, early years/pre: Standard Bathroom Accessibility: Yes How Accessible: Accessible via walker Home Equipment: Cane - single point;Walker - 2 wheels;Bedside commode   Additional Comments: pt reported aide comes 7 days/week for 2.25 hours      Prior Functioning/Environment Level of Independence: Needs assistance  Gait / Transfers Assistance Needed: Walker and cane for gait. Walker when out of home. ADL's / Homemaking Assistance Needed: reports aides assist with IADLs, showers, and dressing; reports does tolieting but sometimes not able to get to bathroom in time   Comments: Has aide 7 days a week for about 2 hours in am; Pt reports weakness/numbness in extremities (L worse than right) chronically but has worsened over the past few weeks   Frequency  Min 2X/week        Progress Toward Goals  OT Goals(current goals can now be found in the care plan section)     Acute Rehab OT Goals Patient Stated Goal: regain strength OT Goal Formulation: With patient  Plan      Co-evaluation    PT/OT/SLP Co-Evaluation/Treatment: Yes Reason for Co-Treatment: For patient/therapist safety;To address functional/ADL transfers   OT goals addressed during session: ADL's and self-care      AM-PAC OT "6 Clicks" Daily Activity     Outcome Measure   Help from another person eating meals?: A Little Help from another person taking care of personal grooming?: A Little Help from another person toileting, which includes using toliet, bedpan, or urinal?: Total Help from another person bathing (including washing, rinsing, drying)?: A Lot Help from another person to put on and taking off regular upper body clothing?: A Little Help from another person to put on and taking off regular lower body clothing?: Total 6 Click Score: 13    End  of Session Equipment Utilized During Treatment: Cervical collar;Rolling walker  OT Visit Diagnosis: Other abnormalities of gait and mobility  (R26.89);Muscle weakness (generalized) (M62.81);Pain Pain - part of body:  (neck)   Activity Tolerance Patient tolerated treatment well   Patient Left in chair;with call bell/phone within reach;with chair alarm set;with nursing/sitter in room   Nurse Communication Mobility status        Time: 0920-1003 OT Time Calculation (min): 43 min  Charges: OT General Charges $OT Visit: 1 Visit OT Evaluation $OT Re-eval: 1 Re-eval OT Treatments $Self Care/Home Management : 8-22 mins  Jolaine Artist, [place] Pager [phone] Office [phone]    Delight Stare 03/08/2020, 10:37 AM

## 2020-03-08 NOTE — Progress Notes (Signed)
Physical Therapy Treatment Patient Details Name: [name] MRN: [id number] DOB: 06-24-1961 Today's Date: 03/08/2020    History of Present Illness Pt is 59 yo male who presented to ED with worsening numbness and weakness in bil UE and LE (reports chronic but worsened over past 3-4 weeks).  Pt's MRI revealed severe spinal stenosis C3-4, C4-5 with cord compression. (8/13) s/p cervical laminectomy and medial facetectomy C3-4 C4-5, and Posterior cervical arthrodesis C3-C6.    PT Comments    Pt progressing with post-op mobility. He was able to demonstrate transfers and ambulation with up to +2 mod assist and RW for support. Pt limited by incontinence this session. Noted slow processing and decreased initiation at times. Education was reinforced on precautions, and brace application/wearing schedule. Will continue to follow.     Follow Up Recommendations  CIR     Equipment Recommendations  None recommended by PT    Recommendations for Other Services Rehab consult     Precautions / Restrictions Precautions Precautions: Cervical Precaution Booklet Issued: Yes (comment) Precaution Comments: reviewed precautions with patinet  Required Braces or Orthoses: Cervical Brace Cervical Brace: Soft collar (for OOB ) Restrictions Weight Bearing Restrictions: No    Mobility  Bed Mobility Overal bed mobility: Needs Assistance Bed Mobility: Rolling;Sidelying to Sit Rolling: Min guard Sidelying to sit: Min assist;HOB elevated       General bed mobility comments: cueing for log roll technique, min assist for trunk elevation to EOB and increased time to scoot forward  Transfers Overall transfer level: Needs assistance Equipment used: Rolling walker (2 wheeled) Transfers: Sit to/from [facility] Sit to Stand: Mod assist;+2 physical assistance;+2 safety/equipment;From elevated surface Stand pivot transfers: Min assist;+2 safety/equipment       General transfer comment:  min assist +2 from elevated EOB, mod assist +2 from regular height BSC/recliner; patient requires cueing for hand placement and safety, physical assist to power up and steady but once up pivoting to next surface with min assist +2 for safety   Ambulation/Gait Ambulation/Gait assistance: Min assist;+2 safety/equipment Gait Distance (Feet): 5 Feet Assistive device: Rolling walker (2 wheeled) ([name]) Gait Pattern/deviations: Step-to pattern;Decreased stride length;Shuffle;Ataxic;Narrow base of support;Drifts right/left Gait velocity: decreased Gait velocity interpretation: <1.31 ft/sec, indicative of household ambulator General Gait Details: Pt only took a few steps in the room before he had bowel incontinence, and focus of the session shifted to toileting.    Stairs             Wheelchair Mobility    Modified Rankin (Stroke Patients Only)       Balance Overall balance assessment: Needs assistance Sitting-balance support: No upper extremity supported Sitting balance-Leahy Scale: Fair     Standing balance support: Bilateral upper extremity supported;During functional activity Standing balance-Leahy Scale: Poor Standing balance comment: relies on RW and external support                            Cognition Arousal/Alertness: Awake/alert Behavior During Therapy: Flat affect Overall Cognitive Status: Impaired/Different from baseline Area of Impairment: Memory;Awareness;Problem solving;Following commands                     Memory: Decreased short-term memory;Decreased recall of precautions Following Commands: Follows one step commands consistently;Follows one step commands with increased time;Follows multi-step commands consistently   Awareness: Intellectual Problem Solving: Slow processing;Decreased initiation;Difficulty sequencing;Requires verbal cues;Requires tactile cues General Comments: patient requires 1 step cueing, decreased recall of  precautions and poor awareness to deficits; incontinent of bowel with no awareness during session       Exercises      General Comments        Pertinent Vitals/Pain Pain Assessment: Faces Faces Pain Scale: Hurts even more Pain Location: neck, post op Pain Descriptors / Indicators: Guarding;Aching Pain Intervention(s): Limited activity within patient's tolerance;Monitored during session;Repositioned    Home Living Family/patient expects to be discharged to:: Private residence Living Arrangements: Alone Available Help at Discharge: Personal care attendant Type of Home: Apartment Home Access: Level entry   Home Layout: One level Home Equipment: Cane - single point;Walker - 2 wheels;Bedside commode Additional Comments: pt reported aide comes 7 days/week for 2.25 hours    Prior Function Level of Independence: Needs assistance  Gait / Transfers Assistance Needed: Walker and cane for gait. Walker when out of home. ADL's / Homemaking Assistance Needed: reports aides assist with IADLs, showers, and dressing; reports does tolieting but sometimes not able to get to bathroom in time Comments: Has aide 7 days a week for about 2 hours in am; Pt reports weakness/numbness in extremities (L worse than right) chronically but has worsened over the past few weeks   PT Goals (current goals can now be found in the care plan section) Acute Rehab PT Goals Patient Stated Goal: regain strength PT Goal Formulation: With patient Time For Goal Achievement: 03/20/20 Potential to Achieve Goals: Good Progress towards PT goals: Progressing toward goals    Frequency    Min 4X/week      PT Plan Current plan remains appropriate    Co-evaluation PT/OT/SLP Co-Evaluation/Treatment: Yes Reason for Co-Treatment: For patient/therapist safety;To address functional/ADL transfers PT goals addressed during session: Mobility/safety with mobility;Balance;Proper use of DME OT goals addressed during session:  ADL's and self-care      AM-PAC PT "6 Clicks" Mobility   Outcome Measure  Help needed turning from your back to your side while in a flat bed without using bedrails?: A Little Help needed moving from lying on your back to sitting on the side of a flat bed without using bedrails?: A Little Help needed moving to and from a bed to a chair (including a wheelchair)?: A Little Help needed standing up from a chair using your arms (e.g., wheelchair or bedside chair)?: A Lot Help needed to walk in hospital room?: A Little Help needed climbing 3-5 steps with a railing? : Total 6 Click Score: 15    End of Session Equipment Utilized During Treatment: Gait belt;Cervical collar (soft collar) Activity Tolerance: Patient tolerated treatment well Patient left: in chair;with chair alarm set;with SCD's reapplied Nurse Communication: Mobility status PT Visit Diagnosis: Unsteadiness on feet (R26.81);Other abnormalities of gait and mobility (R26.89);Muscle weakness (generalized) (M62.81);Difficulty in walking, not elsewhere classified (R26.2)     Time: 3845-3646 PT Time Calculation (min) (ACUTE ONLY): 49 min  Charges:  $Gait Training: 8-22 mins $Therapeutic Activity: 8-22 mins                     Rolinda Roan, PT, DPT Acute Rehabilitation Services Pager: [phone] Office: [place] 03/08/2020, 12:49 PM

## 2020-03-08 NOTE — Progress Notes (Signed)
PROGRESS NOTE    [name]  IOX:[id number] DOB: 09-10-1960 DOA: 02/25/2020 PCP: Ladell Pier, MD    Brief Narrative:  59 y.o.malewith medical history significant ofhypertension, hyperlipidemia, diabetes mellitus, TIA, diabetic neuropathy, chronic diastolic CHF, cocaine abuse, tobacco abuse, presents to emergency department with worsening numbness and weakness in bilateral upper and lower extremities since 1 day.  He has diabetic neuropathy however since yesterday he has started feeling worsening of his numbness and tingling and weakness sensation in bilateral upper and lower extremities. Tells me that he has difficulty lifting while picking up his utensils, unable to walk due to weakness. Reports that his symptoms are persistent, no aggravating or relieving factors. Denies association with recent trauma, fever, chills, headache, blurry vision, urinary, bowel incontinence.  He smokes cigarettes every day, denies alcohol use however uses cocaine now and then. His last cocaine use was 2 days ago. He lives alone at home and uses cane and walker for ambulation.  Upon arrival to ED: Patient's vital signs stable, afebrile, initial labs such as CBC, CMP: WNL. MRI cervical: Thoracic spine shows moderate spinal stenosis and cord compression at C3-C4 and C4-C5. Multilevel degenerative changes noted in thoracic spine. EDP talk to neurosurgery.[facility] hospitalist consulted for admission for cervical spinal stenosis with cord compression  Assessment & Plan:   Principal Problem:   Cervical spinal cord compression (HCC) Active Problems:   Hyperlipidemia associated with type 2 diabetes mellitus ([place])   Essential hypertension   Cocaine abuse ([place])   Type 2 diabetes mellitus (HCC)   Peripheral neuropathy   Diastolic congestive heart failure ([place])   Spinal stenosis, cervical region   Cervical stenosis of spinal canal   Cervical stenosis with cord compression Myelopathy,  quadriparesis Patient presenting from home with progressive weakness to bilateral upper and lower extremities.  Acutely worsening over the past 1-2 days.  MR C/T-spine notable for moderate spinal stenosis with cord compression C3-4 and C4-5 with associated cord edema and multilevel degenerative changes noted in the thoracic spine. --Neurosurgery following, appreciate assistance --pt was continued on Decadron 2mg  IV q8hrs --PT/OT following, recommendations for CIR --Tylenol, oxycodone, continue with Dilaudid as needed for pain control --Pt now s/p surgery 8/13 by Dr. Ronnald Ramp. CIR was recommended by therapy. CIR placement in progress  Essential hypertension --Continue home labetalol, lisinopril as tolerated --Increase hydralazine to 25 mg twice daily on 03/02/2020 --Continue statin --Holding aspirin as above for possible surgical invention early next week. --Continue to monitor blood pressure closely and adjust as needed  Type 2 diabetes mellitus Hemoglobin A1c 7.1, well controlled. --Holding home Metformin, glipizide, Farxiga while in hospital --Currently on lantus to 45 units and novolog to 10 units with meals --Steroids added per Neurosurgery, thus increased lantus to 55 units with novolog to 12 units --Cont to check CBGs every 4 hours AC/at bedtime  Hyperlipidemia --continue Atorvastatin 40 mg p.o. daily as tolerated  Diabetic neuropathy --Continue Cymbalta and gabapentin  Chronic diastolic congestive heart failure, compensated --Continue labetalol, lisinopril --continue with Furosemide 40 mg p.o. daily --Continue statin as tolerated --Strict I's and O's and daily weights  Polysubstance abuse with cocaine, EtOH, tobacco --Review of UDS, positive cocaine on 11/08/2019, 01/23/2016, 10/03/2015.  Counseled regarding cessation with patient verbalizing understanding.  DVT prophylaxis: SCD's Code Status: Full Family Communication: Pt in room, family not at bedside  Status is:  Inpatient  Remains inpatient appropriate because:Ongoing diagnostic testing needed not appropriate for outpatient work up   Dispo:  Patient From: Home  Planned Disposition: Inpatient  Rehab  Expected discharge date: 03/08/20  Medically stable for discharge: No        Consultants:   Neurosurgery  CIR  Procedures:  8/13 - 1.  DeCompressive cervical laminectomy and medial facetectomy C3-4 C4-5, 2.  Posterior cervical arthrodesis C3-C6 utilizing locally harvested morselized autologous bone graft and morselized allograft, 3.  Lateral mass fixation C3-C6 inclusive utilizing Alphatec lateral mass screws  Antimicrobials: Anti-infectives (From admission, onward)   Start     Dose/Rate Route Frequency Ordered Stop   03/06/20 0000  ceFAZolin (ANCEF) IVPB 2g/100 mL premix        2 g 200 mL/hr over 30 Minutes Intravenous Every 8 hours 03/05/20 2134 03/06/20 0909   03/05/20 1707  bacitracin 50,000 Units in sodium chloride 0.9 % 500 mL irrigation  Status:  Discontinued          As needed 03/05/20 1707 03/05/20 1822      Subjective: Without complaints this AM  Objective: Vitals:   03/08/20 0400 03/08/20 0500 03/08/20 0736 03/08/20 1218  BP: 132/60  (!) 141/68 123/74  Pulse: 64  (!) 51 60  Resp: 18  18 18   Temp: 97.7 F (36.5 C)  98 F (36.7 C) 98.1 F (36.7 C)  TempSrc: Oral  Oral Oral  SpO2: 100%  100% 100%  Weight:  110.3 kg    Height:        Intake/Output Summary (Last 24 hours) at 03/08/2020 1509 Last data filed at 03/08/2020 0800 Gross per 24 hour  Intake --  Output 1200 ml  Net -1200 ml   Filed Weights   03/05/20 2332 03/07/20 0408 03/08/20 0500  Weight: 112 kg 109.2 kg 110.3 kg    Examination: General exam: Awake, laying in bed, in nad Respiratory system: Normal respiratory effort, no audible wheezing Cardiovascular system: perfused Gastrointestinal system: nondistended, obese Central nervous system: CN2-12 grossly intact, strength intact Extremities:  Perfused, no clubbing Skin: Normal skin turgor, no notable skin lesions seen Psychiatry: Mood normal // no visual hallucinations    Data Reviewed: I have personally reviewed following labs and imaging studies  CBC: Recent Labs  Lab 03/02/20 0621 03/07/20 0316  WBC 14.3* 18.9*  HGB 13.7 12.7*  HCT 42.3 39.2  MCV 83.9 83.2  PLT 266 130   Basic Metabolic Panel: Recent Labs  Lab 03/02/20 0621 03/03/20 0701 03/07/20 0316  NA 138 138 133*  K 4.9 4.3 4.8  CL 105 104 103  CO2 23 23 21*  GLUCOSE 206* 175* 323*  BUN 38* 33* 39*  CREATININE 1.01 0.94 1.05  CALCIUM 9.3 9.1 8.6*  MG 2.0  --   --    GFR: Estimated Creatinine Clearance: 99.1 mL/min (by C-G formula based on SCr of 1.05 mg/dL). Liver Function Tests: Recent Labs  Lab 03/07/20 0316  AST 13*  ALT 16  ALKPHOS 53  BILITOT 0.7  PROT 5.7*  ALBUMIN 2.9*   No results for input(s): LIPASE, AMYLASE in the last 168 hours. No results for input(s): AMMONIA in the last 168 hours. Coagulation Profile: No results for input(s): INR, PROTIME in the last 168 hours. Cardiac Enzymes: No results for input(s): CKTOTAL, CKMB, CKMBINDEX, TROPONINI in the last 168 hours. BNP (last 3 results) No results for input(s): PROBNP in the last 8760 hours. HbA1C: No results for input(s): HGBA1C in the last 72 hours. CBG: Recent Labs  Lab 03/07/20 1149 03/07/20 1700 03/07/20 2208 03/08/20 0620 03/08/20 1216  GLUCAP 282* 148* 339* 217* 196*   Lipid Profile: No  results for input(s): CHOL, HDL, LDLCALC, TRIG, CHOLHDL, LDLDIRECT in the last 72 hours. Thyroid Function Tests: No results for input(s): TSH, T4TOTAL, FREET4, T3FREE, THYROIDAB in the last 72 hours. Anemia Panel: No results for input(s): VITAMINB12, FOLATE, FERRITIN, TIBC, IRON, RETICCTPCT in the last 72 hours. Sepsis Labs: No results for input(s): PROCALCITON, LATICACIDVEN in the last 168 hours.  Recent Results (from the past 240 hour(s))  Surgical pcr screen     Status:  Abnormal   Collection Time: 03/04/20  8:30 PM   Specimen: Nasal Mucosa; Nasal Swab  Result Value Ref Range Status   MRSA, PCR NEGATIVE NEGATIVE Final   Staphylococcus aureus POSITIVE (A) NEGATIVE Final    Comment: (NOTE) The Xpert SA Assay (FDA approved for NASAL specimens in patients 61 years of age and older), is one component of a comprehensive surveillance program. It is not intended to diagnose infection nor to guide or monitor treatment. Performed at [facility], [place] [address] [place], [place] [address]   Aerobic Culture (superficial specimen)     Status: None   Collection Time: 03/05/20  4:52 PM   Specimen: Abscess  Result Value Ref Range Status   Specimen Description ABSCESS  Final   Special Requests POSTERIOR CERVICAL  Final   Gram Stain NO WBC SEEN NO ORGANISMS SEEN   Final   Culture   Final    NO GROWTH 2 DAYS Performed at [facility], [place] [address], [place], [place] [address]    Report Status 03/08/2020 FINAL  Final     Radiology Studies: No results found.  Scheduled Meds: . atorvastatin  40 mg Oral Daily  . baclofen  5 mg Oral TID  . dexamethasone (DECADRON) injection  2 mg Intravenous Q6H   Or  . dexamethasone  2 mg Oral Q6H  . DULoxetine  60 mg Oral Daily  . furosemide  40 mg Oral Daily  . gabapentin  300 mg Oral BID  . hydrALAZINE  25 mg Oral BID  . insulin aspart  0-20 Units Subcutaneous TID WC  . insulin aspart  0-5 Units Subcutaneous QHS  . insulin aspart  12 Units Subcutaneous TID WC  . insulin glargine  55 Units Subcutaneous Daily  . labetalol  100 mg Oral BID  . lisinopril  40 mg Oral Daily  . mupirocin ointment  1 application Nasal BID  . senna  1 tablet Oral BID  . sodium chloride flush  3 mL Intravenous Q12H   Continuous Infusions: . sodium chloride 250 mL (03/05/20 2337)  . 0.9 % NaCl with KCl 20 mEq / L       LOS: 11 days   Marylu Lund, MD [facility] Pager On Amion  If 7PM-7AM, please contact  night-coverage 03/08/2020, 3:09 PM

## 2020-03-09 ENCOUNTER — Encounter (HOSPITAL_COMMUNITY): Payer: Self-pay | Admitting: Neurological Surgery

## 2020-03-09 LAB — GLUCOSE, CAPILLARY
Glucose-Capillary: 109 mg/dL — ABNORMAL HIGH (ref 70–99)
Glucose-Capillary: 276 mg/dL — ABNORMAL HIGH (ref 70–99)
Glucose-Capillary: 222 mg/dL — ABNORMAL HIGH (ref 70–99)
Glucose-Capillary: 103 mg/dL — ABNORMAL HIGH (ref 70–99)

## 2020-03-09 NOTE — Progress Notes (Addendum)
Inpatient Rehab Admissions Coordinator:   I spoke with pt.'s sister who reports that she can only provide assistance 1 day a week. I discussed this with Pt. And he is agreeable to CIR stay followed by d/c to SNF if not able to achieve mod I goals by d/c.Will plan to pursue admit when bed becomes available. I do not have a bed available for this patient today.   Clemens Catholic, [place], [place] Admissions Coordinator  [phone] ([place]) [phone] (office)

## 2020-03-09 NOTE — Progress Notes (Signed)
Physical Therapy Treatment Patient Details Name: [name] MRN: [id number] DOB: 03-Mar-1961 Today's Date: 03/09/2020    History of Present Illness Pt is 59 yo male who presented to ED with worsening numbness and weakness in bil UE and LE (reports chronic but worsened over past 3-4 weeks).  Pt's MRI revealed severe spinal stenosis C3-4, C4-5 with cord compression. (8/13) s/p cervical laminectomy and medial facetectomy C3-4 C4-5, and Posterior cervical arthrodesis C3-C6.    PT Comments    Patient received in bed, pleasant but politely declining everything but EOB and therapeutic exercises due to severe pain levels; education provided on pain post-op as well as patterns of nerve pain following surgery in context of cord compression. Able to get to EOB and sit midline with S for 2-3 minutes but sitting tolerance limited by pain and he required much higher levels of physical assist today, also due to pain. Completed ankle pumps, heel slides, and SLRs in bed, noting L LE somewhat weaker and more easily fatigued than RLE. He needed simple cues as well as max cues to maintain cervical precautions functionally during session and continues to have poor awareness of safety and functional deficits. Left in bed with all needs met, bed alarm active,  and RN aware of request for pain medication.     Follow Up Recommendations  CIR     Equipment Recommendations  None recommended by PT    Recommendations for Other Services       Precautions / Restrictions Precautions Precautions: Cervical Precaution Booklet Issued: Yes (comment) Precaution Comments: reviewed precautions with patinet  Required Braces or Orthoses: Cervical Brace Cervical Brace: Soft collar (for OOB) Restrictions Weight Bearing Restrictions: No    Mobility  Bed Mobility Overal bed mobility: Needs Assistance Bed Mobility: Rolling;Sidelying to Sit;Sit to Sidelying Rolling: Min guard Sidelying to sit: Max assist;+2 for physical  assistance     Sit to sidelying: Max assist;+2 for physical assistance General bed mobility comments: min guard/max cues for log roll technique, then needed MaxAx2 for sidelying to sit due to pain today  Transfers                 General transfer comment: deferred- extreme pain  Ambulation/Gait             General Gait Details: deferred- extreme pain   Stairs             Wheelchair Mobility    Modified Rankin (Stroke Patients Only)       Balance Overall balance assessment: Needs assistance Sitting-balance support: Bilateral upper extremity supported;Feet supported Sitting balance-Leahy Scale: Fair Sitting balance - Comments: S for midline sitting balance but did not challenge dynamically       Standing balance comment: unable to assess today due to pain                            Cognition Arousal/Alertness: Awake/alert Behavior During Therapy: Flat affect Overall Cognitive Status: Impaired/Different from baseline Area of Impairment: Memory;Awareness;Problem solving;Following commands                     Memory: Decreased short-term memory;Decreased recall of precautions Following Commands: Follows one step commands consistently;Follows one step commands with increased time;Follows multi-step commands consistently   Awareness: Intellectual Problem Solving: Slow processing;Decreased initiation;Difficulty sequencing;Requires verbal cues;Requires tactile cues General Comments: benefits from simple, one step cues but needed total reminders for cervical precautions during functional moblity  Exercises      General Comments        Pertinent Vitals/Pain Pain Assessment: 0-10 Pain Score: 10-Worst pain ever Pain Location: neck, post op Pain Descriptors / Indicators: Guarding;Aching Pain Intervention(s): Limited activity within patient's tolerance;Monitored during session;Patient requesting pain meds-RN notified    Home  Living                      Prior Function            PT Goals (current goals can now be found in the care plan section) Acute Rehab PT Goals Patient Stated Goal: regain strength PT Goal Formulation: With patient Time For Goal Achievement: 03/20/20 Potential to Achieve Goals: Good Progress towards PT goals: Not progressing toward goals - comment (limited by pain today)    Frequency    Min 4X/week      PT Plan Current plan remains appropriate    Co-evaluation              AM-PAC PT "6 Clicks" Mobility   Outcome Measure  Help needed turning from your back to your side while in a flat bed without using bedrails?: A Little Help needed moving from lying on your back to sitting on the side of a flat bed without using bedrails?: A Lot Help needed moving to and from a bed to a chair (including a wheelchair)?: A Lot Help needed standing up from a chair using your arms (e.g., wheelchair or bedside chair)?: A Lot Help needed to walk in hospital room?: A Lot Help needed climbing 3-5 steps with a railing? : Total 6 Click Score: 12    End of Session   Activity Tolerance: Patient limited by pain Patient left: in bed;with call bell/phone within reach;with bed alarm set Nurse Communication: Mobility status;Patient requests pain meds;Other (comment) (soft collar for OOB applied in sitting) PT Visit Diagnosis: Unsteadiness on feet (R26.81);Other abnormalities of gait and mobility (R26.89);Muscle weakness (generalized) (M62.81);Difficulty in walking, not elsewhere classified (R26.2)     Time: 8421-0312 PT Time Calculation (min) (ACUTE ONLY): 22 min  Charges:  $Therapeutic Activity: 8-22 mins                     Windell Norfolk, DPT, PN1   Supplemental Physical Therapist [place]    Pager [phone] Acute Rehab Office [phone]

## 2020-03-09 NOTE — Progress Notes (Signed)
Patient ID: [name], male   DOB: 07/24/1961, 59 y.o.   MRN: [id number] Looks good, "not much pain," a little sore in neck, incision looks good, grips seem stronger, less clonic activity. Started low dose baclofen yesterday.

## 2020-03-10 DIAGNOSIS — M4802 Spinal stenosis, cervical region: Principal | ICD-10-CM

## 2020-03-10 LAB — CBC
HCT: 40.2 % (ref 39.0–52.0)
Hemoglobin: 13 g/dL (ref 13.0–17.0)
MCH: 27.1 pg (ref 26.0–34.0)
MCHC: 32.3 g/dL (ref 30.0–36.0)
MCV: 83.9 fL (ref 80.0–100.0)
Platelets: 258 10*3/uL (ref 150–400)
RBC: 4.79 MIL/uL (ref 4.22–5.81)
RDW: 13.9 % (ref 11.5–15.5)
WBC: 19.5 10*3/uL — ABNORMAL HIGH (ref 4.0–10.5)
nRBC: 0 % (ref 0.0–0.2)

## 2020-03-10 LAB — GLUCOSE, CAPILLARY
Glucose-Capillary: 221 mg/dL — ABNORMAL HIGH (ref 70–99)
Glucose-Capillary: 120 mg/dL — ABNORMAL HIGH (ref 70–99)
Glucose-Capillary: 73 mg/dL (ref 70–99)
Glucose-Capillary: 213 mg/dL — ABNORMAL HIGH (ref 70–99)

## 2020-03-10 LAB — COMPREHENSIVE METABOLIC PANEL
ALT: 27 U/L (ref 0–44)
AST: 30 U/L (ref 15–41)
Albumin: 2.6 g/dL — ABNORMAL LOW (ref 3.5–5.0)
Alkaline Phosphatase: 50 U/L (ref 38–126)
Anion gap: 7 (ref 5–15)
BUN: 44 mg/dL — ABNORMAL HIGH (ref 8–23)
CO2: 23 mmol/L (ref 22–32)
Calcium: 8.5 mg/dL — ABNORMAL LOW (ref 8.9–10.3)
Chloride: 102 mmol/L (ref 98–111)
Creatinine, Ser: 1.2 mg/dL (ref 0.61–1.24)
GFR calc Af Amer: >60 mL/min (ref >60)
GFR calc non Af Amer: >60 mL/min (ref >60)
Glucose, Bld: 196 mg/dL — ABNORMAL HIGH (ref 70–99)
Potassium: 5.1 mmol/L (ref 3.5–5.1)
Sodium: 132 mmol/L — ABNORMAL LOW (ref 135–145)
Total Bilirubin: 0.4 mg/dL (ref 0.3–1.2)
Total Protein: 5.3 g/dL — ABNORMAL LOW (ref 6.5–8.1)

## 2020-03-10 NOTE — Progress Notes (Signed)
IP rehab admissions:  I met with patient today.  He is agreeable to CIR.  He knows that if he does not progress to mod I that he will need SNF after CIR d/t lack of 24/7 support at home.  I will check back in am for bed availability.  Call me for questions.  [phone]

## 2020-03-10 NOTE — Progress Notes (Signed)
PROGRESS NOTE  [name] LZJ:[id number] DOB: 11-Apr-1961 DOA: 02/25/2020 PCP: Ladell Pier, MD   LOS: 13 days   Brief Narrative / Interim history: 59 year old male with HTN, HLD, DM2, TIA, diabetic neuropathy, chronic diastolic CHF, cocaine and tobacco use came into the ED with bilateral upper and lower extremity weakness and numbness for the past day.  He underwent an MRI of the spine which showed cervical spinal stenosis with cord compression.  Neurosurgery was consulted and he is status post decompressive cervical laminectomy and medial facetectomy C3-4, C4-5  Subjective / 24h Interval events: Feeling little bit stronger today.  No chest pain, no shortness of breath.  Assessment & Plan: Principal Problem Cervical stenosis with cord compression, myelopathy, quadriparesis-he presented with worsening weakness.  An MRI on admission was positive for moderate spinal stenosis with cord compression C3-4 and C4-5 with associated cord edema and multilevel degenerative changes in the thoracic spine.  Neurosurgery following, patient underwent surgical decompression, currently on Decadron.  Appreciate neurosurgery follow-up.  PT recommending CIR, placement pending  Active Problems Essential hypertension-continue furosemide, labetalol, lisinopril.  Blood pressure overall stable.  Type 2 diabetes mellitus-A1c 7.1, currently on insulin as he is getting steroids.  CBG (last 3)  Recent Labs    03/09/20 2058 03/10/20 0612 03/10/20 1142  GLUCAP 103* 221* 120*   Hyperlipidemia-continue atorvastatin  Chronic diastolic CHF-stable, appears euvolemic, continue beta-blocker, ACE, diuretics  Polysubstance abuse with cocaine, EtOH, tobacco-counseled regarding cessation, no withdrawal symptoms.  Last positive cocaine UDS on 11/08/2019  Scheduled Meds: . atorvastatin  40 mg Oral Daily  . baclofen  5 mg Oral TID  . dexamethasone (DECADRON) injection  2 mg Intravenous Q6H   Or  . dexamethasone  2  mg Oral Q6H  . DULoxetine  60 mg Oral Daily  . furosemide  40 mg Oral Daily  . gabapentin  300 mg Oral BID  . hydrALAZINE  25 mg Oral BID  . insulin aspart  0-20 Units Subcutaneous TID WC  . insulin aspart  0-5 Units Subcutaneous QHS  . insulin aspart  12 Units Subcutaneous TID WC  . insulin glargine  55 Units Subcutaneous Daily  . labetalol  100 mg Oral BID  . lisinopril  40 mg Oral Daily  . senna  1 tablet Oral BID  . sodium chloride flush  3 mL Intravenous Q12H   Continuous Infusions: . sodium chloride 250 mL (03/05/20 2337)   PRN Meds:.acetaminophen **OR** acetaminophen, menthol-cetylpyridinium **OR** phenol, morphine injection, ondansetron **OR** ondansetron (ZOFRAN) IV, oxyCODONE, sodium chloride flush  Diet Orders (From admission, onward)    Start     Ordered   03/06/20 0902  Diet heart healthy/carb modified Room service appropriate? Yes; Fluid consistency: Thin  Diet effective now       Question Answer Comment  Diet-HS Snack? Nothing   Room service appropriate? Yes   Fluid consistency: Thin      03/06/20 0902          DVT prophylaxis: SCD's Start: 03/05/20 2135     Code Status: Full Code  Family Communication: no family at bedside   Status is: Inpatient  Remains inpatient appropriate because:Unsafe d/c plan   Dispo:  Patient From: Home  Planned Disposition: Inpatient Rehab  Expected discharge date: 03/08/20  Medically stable for discharge: No  Consultants:  Neurosurgery   Procedures:  1.  DeCompressive cervical laminectomy and medial facetectomy C3-4 C4-5, 2.  Posterior cervical arthrodesis C3-C6 utilizing locally harvested morselized autologous bone graft and morselized allograft,  3.  Lateral mass fixation C3-C6 inclusive utilizing Alphatec lateral mass screws  Microbiology  None   Antimicrobials: None     Objective: Vitals:   03/09/20 2056 03/10/20 0009 03/10/20 0412 03/10/20 1144  BP: (!) 161/71 123/82 125/85 (!) (P) 151/70  Pulse: (!) 59  64 (!) 55 (P) 62  Resp: 17 18 20  (P) 20  Temp: 98.5 F (36.9 C) 97.7 F (36.5 C) 97.7 F (36.5 C) (P) 98.7 F (37.1 C)  TempSrc: Oral Oral Oral (P) Oral  SpO2: 100% 100% 100% (P) 100%  Weight:      Height:        Intake/Output Summary (Last 24 hours) at 03/10/2020 1436 Last data filed at 03/10/2020 1433 Gross per 24 hour  Intake 660 ml  Output 1675 ml  Net -1015 ml   Filed Weights   03/07/20 0408 03/08/20 0500 03/09/20 0500  Weight: 109.2 kg 110.3 kg 112.1 kg    Examination:  Constitutional: NAD Eyes: no scleral icterus ENMT: Mucous membranes are moist.  Neck: normal, supple Respiratory: clear to auscultation bilaterally, no wheezing, no crackles. Cardiovascular: Regular rate and rhythm, no murmurs / rubs / gallops. Abdomen: non distended, no tenderness. Bowel sounds positive.  Musculoskeletal: no clubbing / cyanosis.  Skin: no rashes Neurologic: generalized weakness   Data Reviewed: I have independently reviewed following labs and imaging studies   CBC: Recent Labs  Lab 03/07/20 0316 03/10/20 0149  WBC 18.9* 19.5*  HGB 12.7* 13.0  HCT 39.2 40.2  MCV 83.2 83.9  PLT 247 465   Basic Metabolic Panel: Recent Labs  Lab 03/07/20 0316 03/10/20 0149  NA 133* 132*  K 4.8 5.1  CL 103 102  CO2 21* 23  GLUCOSE 323* 196*  BUN 39* 44*  CREATININE 1.05 1.20  CALCIUM 8.6* 8.5*   Liver Function Tests: Recent Labs  Lab 03/07/20 0316 03/10/20 0149  AST 13* 30  ALT 16 27  ALKPHOS 53 50  BILITOT 0.7 0.4  PROT 5.7* 5.3*  ALBUMIN 2.9* 2.6*   Coagulation Profile: No results for input(s): INR, PROTIME in the last 168 hours. HbA1C: No results for input(s): HGBA1C in the last 72 hours. CBG: Recent Labs  Lab 03/09/20 1142 03/09/20 1545 03/09/20 2058 03/10/20 0612 03/10/20 1142  GLUCAP 276* 222* 103* 221* 120*    Recent Results (from the past 240 hour(s))  Surgical pcr screen     Status: Abnormal   Collection Time: 03/04/20  8:30 PM   Specimen: Nasal  Mucosa; Nasal Swab  Result Value Ref Range Status   MRSA, PCR NEGATIVE NEGATIVE Final   Staphylococcus aureus POSITIVE (A) NEGATIVE Final    Comment: (NOTE) The Xpert SA Assay (FDA approved for NASAL specimens in patients 48 years of age and older), is one component of a comprehensive surveillance program. It is not intended to diagnose infection nor to guide or monitor treatment. Performed at [facility], [place] [address], [place], [place] [address]   Aerobic Culture (superficial specimen)     Status: None   Collection Time: 03/05/20  4:52 PM   Specimen: Abscess  Result Value Ref Range Status   Specimen Description ABSCESS  Final   Special Requests POSTERIOR CERVICAL  Final   Gram Stain NO WBC SEEN NO ORGANISMS SEEN   Final   Culture   Final    NO GROWTH 2 DAYS Performed at [facility], [place] [address] [place], [place] [address]    Report Status 03/08/2020 FINAL  Final     Radiology Studies: No results found.   Marzetta Board, MD, PhD [facility]  Between 7 am - 7 pm I am available, please contact me via Amion or Securechat  Between 7 pm - 7 am I am not available, please contact night coverage MD/APP via Amion

## 2020-03-10 NOTE — Progress Notes (Signed)
Physical Therapy Treatment Patient Details Name: [name] MRN: [id number] DOB: 1961/02/18 Today's Date: 03/10/2020    History of Present Illness Pt is 59 yo male who presented to ED with worsening numbness and weakness in bil UE and LE (reports chronic but worsened over past 3-4 weeks).  Pt's MRI revealed severe spinal stenosis C3-4, C4-5 with cord compression. (8/13) s/p cervical laminectomy and medial facetectomy C3-4 C4-5, and Posterior cervical arthrodesis C3-C6.    PT Comments    Pt was able to stand and pivot to the recliner chair with mod assist and the RW.  He would be safer to progress gait with two person assist and chair to follow.  He is weaker on his left upper extremity and his left leg.   He was premedicated and encouraged strongly to mobilize OOB today (he reported being too painful yesterday) and that seemed to work.  PT will continue to follow acutely for safe mobility progression.  Follow Up Recommendations  CIR     Equipment Recommendations  3in1 (PT)    Recommendations for Other Services       Precautions / Restrictions Precautions Precautions: Cervical Precaution Booklet Issued: Yes (comment) Precaution Comments: reviewed precautions with patinet handout in room Required Braces or Orthoses: Cervical Brace Cervical Brace: Soft collar (pt reports it doesn't help, so did not donn)    Mobility  Bed Mobility Overal bed mobility: Needs Assistance Bed Mobility: Rolling;Sidelying to Sit Rolling: Min assist Sidelying to sit: Mod assist       General bed mobility comments: Min assist to help pt reach for bed rail and roll to his left, cues for log roll including knee flexion.  Pt needed mod assist to support trunk to come up to sitting EOB.  Pt stopped half way up due to pain.   Transfers Overall transfer level: Needs assistance Equipment used: Rolling walker (2 wheeled) Transfers: Sit to/from [facility] Sit to Stand: Mod assist;From  elevated surface Stand pivot transfers: Mod assist;From elevated surface       General transfer comment: Sit to stand to RW from elevated bed mod assist with one leg blocked for safety.  Arm rest on chair down and pt taking pivotal steps to the chair.  He has difficulty lifting feet instead of sliding feet and had uncontrolled descent without the ability to reach back to the chair.    Ambulation/Gait             General Gait Details: would be much safer with chair to follow and second person assisting.    Stairs             Wheelchair Mobility    Modified Rankin (Stroke Patients Only)       Balance Overall balance assessment: Needs assistance Sitting-balance support: Feet supported;Bilateral upper extremity supported Sitting balance-Leahy Scale: Poor Sitting balance - Comments: needs bil UE supported to maintain sitting, otherwise he leans left.  Postural control: Left lateral lean Standing balance support: Bilateral upper extremity supported Standing balance-Leahy Scale: Poor Standing balance comment: needs external support from RW and therapist, cues to extend head neck, trunk, hips and knees in standing.                             Cognition Arousal/Alertness: Awake/alert Behavior During Therapy: WFL for tasks assessed/performed Overall Cognitive Status: No family/caregiver present to determine baseline cognitive functioning  General Comments: unsure of cognitive baseline.  Answered questions normally, a bit slow to process, but not significantly.       Exercises      General Comments        Pertinent Vitals/Pain Pain Assessment: Faces Faces Pain Scale: Hurts even more Pain Location: neck, post op Pain Descriptors / Indicators: Guarding;Aching Pain Intervention(s): Limited activity within patient's tolerance;Monitored during session;Premedicated before session;Repositioned;Heat applied     Home Living                      Prior Function            PT Goals (current goals can now be found in the care plan section) Acute Rehab PT Goals Patient Stated Goal: regain strength Progress towards PT goals: Progressing toward goals    Frequency    Min 4X/week      PT Plan Current plan remains appropriate    Co-evaluation              AM-PAC PT "6 Clicks" Mobility   Outcome Measure  Help needed turning from your back to your side while in a flat bed without using bedrails?: A Little Help needed moving from lying on your back to sitting on the side of a flat bed without using bedrails?: A Lot Help needed moving to and from a bed to a chair (including a wheelchair)?: A Lot Help needed standing up from a chair using your arms (e.g., wheelchair or bedside chair)?: A Lot Help needed to walk in hospital room?: Total Help needed climbing 3-5 steps with a railing? : Total 6 Click Score: 11    End of Session Equipment Utilized During Treatment: Gait belt Activity Tolerance: Patient limited by pain Patient left: in chair;with call bell/phone within reach;with chair alarm set Nurse Communication: Mobility status;Other (comment) (to RN tech) PT Visit Diagnosis: Muscle weakness (generalized) (M62.81);Other symptoms and signs involving the nervous system (R29.898);Difficulty in walking, not elsewhere classified (R26.2)     Time: 0388-8280 PT Time Calculation (min) (ACUTE ONLY): 27 min  Charges:  $Therapeutic Activity: 23-37 mins                     Verdene Lennert, PT, DPT  Acute Rehabilitation [phone] pager [phone]) [phone] office

## 2020-03-10 NOTE — Progress Notes (Addendum)
Inpatient Diabetes Program Recommendations  AACE/ADA: New Consensus Statement on Inpatient Glycemic Control (2015)  Target Ranges:  Prepandial:   less than 140 mg/dL      Peak postprandial:   less than 180 mg/dL (1-2 hours)      Critically ill patients:  140 - 180 mg/dL   Lab Results  Component Value Date   GLUCAP 120 (H) 03/10/2020   HGBA1C 7.1 (H) 02/26/2020    Review of Glycemic Control Results for [name], [name] (MRN [id number]) as of 03/10/2020 13:47  Ref. Range 03/09/2020 15:45 03/09/2020 20:58 03/10/2020 06:12 03/10/2020 11:42  Glucose-Capillary Latest Ref Range: 70 - 99 mg/dL 222 (H) 103 (H) 221 (H) 120 (H)   Diabetes history: DM 2 Outpatient Diabetes medications: Glucotrol 10 mg Daily+ Farxiga 10 mg Daily + Metformin 1 gm bid Current orders for Inpatient glycemic control:  Lantus 55 units Novolog 0-20 units tid + hs Novolog 12 units tid meal coverage Decadron 2 mg Q6 hours  Inpatient Diabetes Program Recommendations:    -Noted Novolog correction dose given twice this am. RN notified as this placed patient at risk for hypoglycemia. Following.  Thanks, Bronson Curb, MSN, RNC-OB Diabetes Coordinator [phone] (8a-5p)

## 2020-03-11 ENCOUNTER — Inpatient Hospital Stay (HOSPITAL_COMMUNITY)
Admission: RE | Admit: 2020-03-11 | Discharge: 2020-03-26 | DRG: 559 | Disposition: A | Discharge status: Skilled Nursing Facility | Payer: Medicare Other | Source: Intra-hospital | Attending: Physical Medicine and Rehabilitation | Admitting: Physical Medicine and Rehabilitation

## 2020-03-11 ENCOUNTER — Ambulatory Visit: Payer: Medicare Other | Admitting: Neurology

## 2020-03-11 DIAGNOSIS — M7918 Myalgia, other site: Secondary | ICD-10-CM | POA: Diagnosis not present

## 2020-03-11 DIAGNOSIS — Q039 Congenital hydrocephalus, unspecified: Secondary | ICD-10-CM

## 2020-03-11 DIAGNOSIS — Z4789 Encounter for other orthopedic aftercare: Principal | ICD-10-CM

## 2020-03-11 DIAGNOSIS — K560 Paralytic ileus: Secondary | ICD-10-CM | POA: Diagnosis not present

## 2020-03-11 DIAGNOSIS — M4802 Spinal stenosis, cervical region: Secondary | ICD-10-CM | POA: Diagnosis not present

## 2020-03-11 DIAGNOSIS — Z20822 Contact with and (suspected) exposure to covid-19: Secondary | ICD-10-CM | POA: Diagnosis present

## 2020-03-11 DIAGNOSIS — E1169 Type 2 diabetes mellitus with other specified complication: Secondary | ICD-10-CM | POA: Diagnosis present

## 2020-03-11 DIAGNOSIS — N390 Urinary tract infection, site not specified: Secondary | ICD-10-CM | POA: Diagnosis present

## 2020-03-11 DIAGNOSIS — I10 Essential (primary) hypertension: Secondary | ICD-10-CM | POA: Diagnosis not present

## 2020-03-11 DIAGNOSIS — Z6830 Body mass index (BMI) 30.0-30.9, adult: Secondary | ICD-10-CM | POA: Diagnosis not present

## 2020-03-11 DIAGNOSIS — N401 Enlarged prostate with lower urinary tract symptoms: Secondary | ICD-10-CM | POA: Diagnosis present

## 2020-03-11 DIAGNOSIS — Z7401 Bed confinement status: Secondary | ICD-10-CM | POA: Diagnosis not present

## 2020-03-11 DIAGNOSIS — M6281 Muscle weakness (generalized): Secondary | ICD-10-CM | POA: Diagnosis not present

## 2020-03-11 DIAGNOSIS — E875 Hyperkalemia: Secondary | ICD-10-CM | POA: Diagnosis present

## 2020-03-11 DIAGNOSIS — Z8249 Family history of ischemic heart disease and other diseases of the circulatory system: Secondary | ICD-10-CM

## 2020-03-11 DIAGNOSIS — G8252 Quadriplegia, C1-C4 incomplete: Secondary | ICD-10-CM | POA: Diagnosis present

## 2020-03-11 DIAGNOSIS — G959 Disease of spinal cord, unspecified: Secondary | ICD-10-CM

## 2020-03-11 DIAGNOSIS — I130 Hypertensive heart and chronic kidney disease with heart failure and stage 1 through stage 4 chronic kidney disease, or unspecified chronic kidney disease: Secondary | ICD-10-CM | POA: Diagnosis present

## 2020-03-11 DIAGNOSIS — N319 Neuromuscular dysfunction of bladder, unspecified: Secondary | ICD-10-CM | POA: Diagnosis present

## 2020-03-11 DIAGNOSIS — I2510 Atherosclerotic heart disease of native coronary artery without angina pectoris: Secondary | ICD-10-CM | POA: Diagnosis present

## 2020-03-11 DIAGNOSIS — R600 Localized edema: Secondary | ICD-10-CM | POA: Diagnosis not present

## 2020-03-11 DIAGNOSIS — Z823 Family history of stroke: Secondary | ICD-10-CM

## 2020-03-11 DIAGNOSIS — E113299 Type 2 diabetes mellitus with mild nonproliferative diabetic retinopathy without macular edema, unspecified eye: Secondary | ICD-10-CM | POA: Diagnosis not present

## 2020-03-11 DIAGNOSIS — Z87891 Personal history of nicotine dependence: Secondary | ICD-10-CM

## 2020-03-11 DIAGNOSIS — M62838 Other muscle spasm: Secondary | ICD-10-CM | POA: Diagnosis present

## 2020-03-11 DIAGNOSIS — Z9181 History of falling: Secondary | ICD-10-CM

## 2020-03-11 DIAGNOSIS — D72829 Elevated white blood cell count, unspecified: Secondary | ICD-10-CM | POA: Diagnosis present

## 2020-03-11 DIAGNOSIS — Z981 Arthrodesis status: Secondary | ICD-10-CM | POA: Diagnosis not present

## 2020-03-11 DIAGNOSIS — E1122 Type 2 diabetes mellitus with diabetic chronic kidney disease: Secondary | ICD-10-CM | POA: Diagnosis present

## 2020-03-11 DIAGNOSIS — I5032 Chronic diastolic (congestive) heart failure: Secondary | ICD-10-CM

## 2020-03-11 DIAGNOSIS — R200 Anesthesia of skin: Secondary | ICD-10-CM | POA: Diagnosis present

## 2020-03-11 DIAGNOSIS — K6389 Other specified diseases of intestine: Secondary | ICD-10-CM | POA: Diagnosis not present

## 2020-03-11 DIAGNOSIS — E785 Hyperlipidemia, unspecified: Secondary | ICD-10-CM | POA: Diagnosis present

## 2020-03-11 DIAGNOSIS — M2550 Pain in unspecified joint: Secondary | ICD-10-CM | POA: Diagnosis not present

## 2020-03-11 DIAGNOSIS — B9620 Unspecified Escherichia coli [E. coli] as the cause of diseases classified elsewhere: Secondary | ICD-10-CM | POA: Diagnosis not present

## 2020-03-11 DIAGNOSIS — K567 Ileus, unspecified: Secondary | ICD-10-CM

## 2020-03-11 DIAGNOSIS — G629 Polyneuropathy, unspecified: Secondary | ICD-10-CM | POA: Diagnosis not present

## 2020-03-11 DIAGNOSIS — E11319 Type 2 diabetes mellitus with unspecified diabetic retinopathy without macular edema: Secondary | ICD-10-CM | POA: Diagnosis present

## 2020-03-11 DIAGNOSIS — I252 Old myocardial infarction: Secondary | ICD-10-CM

## 2020-03-11 DIAGNOSIS — K5939 Other megacolon: Secondary | ICD-10-CM | POA: Diagnosis not present

## 2020-03-11 DIAGNOSIS — R188 Other ascites: Secondary | ICD-10-CM | POA: Diagnosis not present

## 2020-03-11 DIAGNOSIS — I700 Atherosclerosis of aorta: Secondary | ICD-10-CM | POA: Diagnosis not present

## 2020-03-11 DIAGNOSIS — Z8673 Personal history of transient ischemic attack (TIA), and cerebral infarction without residual deficits: Secondary | ICD-10-CM

## 2020-03-11 DIAGNOSIS — N182 Chronic kidney disease, stage 2 (mild): Secondary | ICD-10-CM | POA: Diagnosis present

## 2020-03-11 DIAGNOSIS — E0840 Diabetes mellitus due to underlying condition with diabetic neuropathy, unspecified: Secondary | ICD-10-CM | POA: Diagnosis not present

## 2020-03-11 DIAGNOSIS — R109 Unspecified abdominal pain: Secondary | ICD-10-CM | POA: Diagnosis not present

## 2020-03-11 DIAGNOSIS — Z79899 Other long term (current) drug therapy: Secondary | ICD-10-CM | POA: Diagnosis not present

## 2020-03-11 DIAGNOSIS — G8254 Quadriplegia, C5-C7 incomplete: Secondary | ICD-10-CM | POA: Diagnosis present

## 2020-03-11 DIAGNOSIS — E1142 Type 2 diabetes mellitus with diabetic polyneuropathy: Secondary | ICD-10-CM | POA: Diagnosis present

## 2020-03-11 DIAGNOSIS — E11649 Type 2 diabetes mellitus with hypoglycemia without coma: Secondary | ICD-10-CM | POA: Diagnosis not present

## 2020-03-11 DIAGNOSIS — G9529 Other cord compression: Secondary | ICD-10-CM | POA: Diagnosis present

## 2020-03-11 DIAGNOSIS — R32 Unspecified urinary incontinence: Secondary | ICD-10-CM | POA: Diagnosis present

## 2020-03-11 DIAGNOSIS — K592 Neurogenic bowel, not elsewhere classified: Secondary | ICD-10-CM | POA: Diagnosis present

## 2020-03-11 DIAGNOSIS — Z7984 Long term (current) use of oral hypoglycemic drugs: Secondary | ICD-10-CM

## 2020-03-11 DIAGNOSIS — R609 Edema, unspecified: Secondary | ICD-10-CM | POA: Diagnosis not present

## 2020-03-11 DIAGNOSIS — Z833 Family history of diabetes mellitus: Secondary | ICD-10-CM

## 2020-03-11 DIAGNOSIS — G8929 Other chronic pain: Secondary | ICD-10-CM | POA: Diagnosis present

## 2020-03-11 DIAGNOSIS — R7309 Other abnormal glucose: Secondary | ICD-10-CM | POA: Diagnosis not present

## 2020-03-11 LAB — GLUCOSE, CAPILLARY
Glucose-Capillary: 192 mg/dL — ABNORMAL HIGH (ref 70–99)
Glucose-Capillary: 209 mg/dL — ABNORMAL HIGH (ref 70–99)
Glucose-Capillary: 97 mg/dL (ref 70–99)

## 2020-03-11 MED ORDER — INSULIN ASPART 100 UNIT/ML ~~LOC~~ SOLN
0.0000 [IU] | Freq: Three times a day (TID) | SUBCUTANEOUS | 11 refills | Status: DC
Start: 2020-03-11 — End: 2020-03-26

## 2020-03-11 MED ORDER — INSULIN GLARGINE 100 UNIT/ML ~~LOC~~ SOLN
55.0000 [IU] | Freq: Every day | SUBCUTANEOUS | Status: DC
  Administered 2020-03-12: 55 [IU] via SUBCUTANEOUS
  Filled 2020-03-11 (×2): qty 0.55

## 2020-03-11 MED ORDER — DEXAMETHASONE SODIUM PHOSPHATE 4 MG/ML IJ SOLN
1.0000 mg | Freq: Three times a day (TID) | INTRAMUSCULAR | Status: DC
  Administered 2020-03-12 – 2020-03-14 (×2): 1 mg via INTRAVENOUS
  Filled 2020-03-11 (×17): qty 0.25

## 2020-03-11 MED ORDER — INSULIN ASPART 100 UNIT/ML ~~LOC~~ SOLN
0.0000 [IU] | Freq: Three times a day (TID) | SUBCUTANEOUS | Status: DC
  Administered 2020-03-12: 15 [IU] via SUBCUTANEOUS
  Administered 2020-03-14: 3 [IU] via SUBCUTANEOUS
  Administered 2020-03-15: 20 [IU] via SUBCUTANEOUS

## 2020-03-11 MED ORDER — GABAPENTIN 300 MG PO CAPS
300.0000 mg | ORAL_CAPSULE | Freq: Two times a day (BID) | ORAL | Status: DC
  Administered 2020-03-11 – 2020-03-13 (×4): 300 mg via ORAL
  Filled 2020-03-11 (×4): qty 1

## 2020-03-11 MED ORDER — DEXAMETHASONE SODIUM PHOSPHATE 4 MG/ML IJ SOLN: 1.0000 mg | Freq: Three times a day (TID) | INTRAMUSCULAR | Status: DC

## 2020-03-11 MED ORDER — DULOXETINE HCL 30 MG PO CPEP
60.0000 mg | ORAL_CAPSULE | Freq: Every day | ORAL | Status: DC
  Administered 2020-03-12 – 2020-03-26 (×15): 60 mg via ORAL
  Filled 2020-03-11 (×15): qty 2

## 2020-03-11 MED ORDER — INSULIN GLARGINE 100 UNIT/ML ~~LOC~~ SOLN: 20.0000 [IU] | Freq: Every day | SUBCUTANEOUS | 11 refills | Status: DC

## 2020-03-11 MED ORDER — DEXAMETHASONE 1 MG PO TABS: 1.0000 mg | ORAL_TABLET | Freq: Three times a day (TID) | ORAL | Status: DC

## 2020-03-11 MED ORDER — ACETAMINOPHEN 325 MG PO TABS
650.0000 mg | ORAL_TABLET | ORAL | Status: DC | PRN
  Administered 2020-03-14 – 2020-03-23 (×5): 650 mg via ORAL
  Filled 2020-03-11 (×7): qty 2

## 2020-03-11 MED ORDER — ACETAMINOPHEN 650 MG RE SUPP: 650.0000 mg | RECTAL | Status: DC | PRN

## 2020-03-11 MED ORDER — OXYCODONE HCL 5 MG PO TABS
10.0000 mg | ORAL_TABLET | ORAL | Status: DC | PRN
  Administered 2020-03-11 – 2020-03-20 (×11): 10 mg via ORAL
  Filled 2020-03-11 (×12): qty 2

## 2020-03-11 MED ORDER — ONDANSETRON HCL 4 MG/2ML IJ SOLN: 4.0000 mg | Freq: Four times a day (QID) | INTRAMUSCULAR | Status: DC | PRN

## 2020-03-11 MED ORDER — DEXAMETHASONE 0.5 MG PO TABS
1.0000 mg | ORAL_TABLET | Freq: Three times a day (TID) | ORAL | Status: DC
  Administered 2020-03-11 – 2020-03-26 (×41): 1 mg via ORAL
  Filled 2020-03-11 (×45): qty 2

## 2020-03-11 MED ORDER — LISINOPRIL 20 MG PO TABS
40.0000 mg | ORAL_TABLET | Freq: Every day | ORAL | Status: DC
  Administered 2020-03-12 – 2020-03-22 (×11): 40 mg via ORAL
  Filled 2020-03-11 (×11): qty 2

## 2020-03-11 MED ORDER — INSULIN ASPART 100 UNIT/ML ~~LOC~~ SOLN
12.0000 [IU] | Freq: Three times a day (TID) | SUBCUTANEOUS | Status: DC
  Administered 2020-03-12 (×2): 12 [IU] via SUBCUTANEOUS
  Administered 2020-03-12: 8 [IU] via SUBCUTANEOUS
  Administered 2020-03-13 – 2020-03-15 (×4): 12 [IU] via SUBCUTANEOUS

## 2020-03-11 MED ORDER — INSULIN ASPART 100 UNIT/ML ~~LOC~~ SOLN: 5.0000 [IU] | Freq: Three times a day (TID) | SUBCUTANEOUS | 11 refills | Status: DC

## 2020-03-11 MED ORDER — FUROSEMIDE 40 MG PO TABS
40.0000 mg | ORAL_TABLET | Freq: Every day | ORAL | Status: DC
  Administered 2020-03-12 – 2020-03-13 (×2): 40 mg via ORAL
  Filled 2020-03-11 (×2): qty 1

## 2020-03-11 MED ORDER — OXYCODONE HCL 10 MG PO TABS
10.0000 mg | ORAL_TABLET | ORAL | 0 refills | Status: DC | PRN
Start: 2020-03-11 — End: 2020-03-26

## 2020-03-11 MED ORDER — DEXAMETHASONE 2 MG PO TABS
1.0000 mg | ORAL_TABLET | Freq: Three times a day (TID) | ORAL | Status: DC
  Administered 2020-03-11: 1 mg via ORAL
  Filled 2020-03-11: qty 1

## 2020-03-11 MED ORDER — LABETALOL HCL 100 MG PO TABS
100.0000 mg | ORAL_TABLET | Freq: Two times a day (BID) | ORAL | Status: DC
  Administered 2020-03-11 – 2020-03-17 (×13): 100 mg via ORAL
  Filled 2020-03-11 (×14): qty 1

## 2020-03-11 MED ORDER — SENNA 8.6 MG PO TABS
1.0000 | ORAL_TABLET | Freq: Two times a day (BID) | ORAL | Status: DC
  Administered 2020-03-11 – 2020-03-26 (×30): 8.6 mg via ORAL
  Filled 2020-03-11 (×30): qty 1

## 2020-03-11 MED ORDER — ONDANSETRON HCL 4 MG PO TABS
4.0000 mg | ORAL_TABLET | Freq: Four times a day (QID) | ORAL | Status: DC | PRN
  Administered 2020-03-11 – 2020-03-21 (×3): 4 mg via ORAL
  Filled 2020-03-11 (×3): qty 1

## 2020-03-11 MED ORDER — HYDRALAZINE HCL 25 MG PO TABS
25.0000 mg | ORAL_TABLET | Freq: Two times a day (BID) | ORAL | Status: DC
  Administered 2020-03-11 – 2020-03-26 (×30): 25 mg via ORAL
  Filled 2020-03-11 (×30): qty 1

## 2020-03-11 MED ORDER — SORBITOL 70 % SOLN
30.0000 mL | Freq: Every day | Status: DC | PRN
  Administered 2020-03-12 – 2020-03-13 (×2): 30 mL via ORAL
  Filled 2020-03-11 (×2): qty 30

## 2020-03-11 MED ORDER — BACLOFEN 5 MG HALF TABLET
5.0000 mg | ORAL_TABLET | Freq: Three times a day (TID) | ORAL | Status: DC
  Administered 2020-03-11 – 2020-03-13 (×6): 5 mg via ORAL
  Filled 2020-03-11 (×6): qty 1

## 2020-03-11 MED ORDER — ATORVASTATIN CALCIUM 40 MG PO TABS
40.0000 mg | ORAL_TABLET | Freq: Every day | ORAL | Status: DC
  Administered 2020-03-12 – 2020-03-26 (×15): 40 mg via ORAL
  Filled 2020-03-11 (×15): qty 1

## 2020-03-11 NOTE — Progress Notes (Signed)
Patient arrived on unit in a bed via acute staff alert and oriented and without c/o at 1715.

## 2020-03-11 NOTE — Progress Notes (Signed)
Patient ID: [name], male   DOB: 27-Jun-1961, 59 y.o.   MRN: [id number] Feels he getting better, some neck pain, looks comfortable, moves all ext, incision CDI, wean steroids to off, await CIR

## 2020-03-11 NOTE — Progress Notes (Signed)
Retta Diones, RN  Rehab Admission Coordinator  Physical Medicine and Rehabilitation  PMR Pre-admission    Signed  Date of Service:  03/07/2020 11:17 AM      Related encounter: ED to Hosp-Admission (Current) from 02/25/2020 in [facility]      Signed       Show:Clear all [x] Manual[x] Template[x] Copied  Added by: [x] Lind Covert, Runell Gess, CCC-SLP[x] Retta Diones, RN  [] Hover for details PMR Admission Coordinator Pre-Admission Assessment  Patient: [name] DOBIE is an 59 y.o., male MRN: [id number] DOB: [birth year] Height: 6\' 3"  (190.5 cm) Weight: 112.1 kg                                                                                                                                                  Insurance Information HMO:     PPO:      PCP:      IPA:      80/20: yes     OTHER:  PRIMARY: Medicare A & B      Policy#: [id number]      Subscriber: patient CM Name:       Phone#:      Fax#:  Pre-Cert#:       Employer:  Benefits:  Phone #: verified eligibility via [place] on 03/07/20     Name:  Eff. Date: Part A & B effective 06/23/2016     Deduct: $1,484       Out of Pocket Max: NA      Life Max: NA  CIR: 100% with Medicare approval      SNF: 100% days 1-20, 80% days 21-100 Outpatient: 80%     Co-Pay: 20% Home Health: 100%      Co-Pay:  DME: 80%     Co-Pay: 20% Providers: pt's choice  SECONDARY: Medicaid [place] Access       Policy#: [id number]      Phone#: [phone]  Financial Counselor:       Phone#:   The "Data Collection Information Summary" for patients in Inpatient Rehabilitation Facilities with attached "Privacy Act [place] Records" was provided and verbally reviewed with: Patient and Family  Emergency Contact Information         Contact Information    Name Relation Home Work Mobile   [place] Sister [phone]     Surgeon,Shirley Relative [phone]       Current Medical History  Patient  Admitting Diagnosis: Cervical spinal cord compression s/p cervical laminectomy and medical facetectomy C3-C4, C4-C5, and posterior cervical arthrodesis C3-C6  History of Present Illness: A 59 year old right-handed male with history of alcohol/tobacco abuse, prior cocaine abuse complicated by STEMI,, BPH, chronic pain of low back, CKD stage II, diastolic congestive heart failure, type 2 diabetes mellitus, hyperlipidemia, morbid obesity with BMI 30.09, congenital hydrocephalus, history of spinal cord contusion/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 Sherley Bounds with conservative care. Per chart review patient lives alone. 1 level apartment. Has an aide 3 days a week for 2 hours at a time used a walker and cane prior to admission. Aide does assist with some ADLs. Presented 02/26/2020 progressive weakness/gait instability lower extremities left greater than right as well as numbness and spasticity with stool incontinence. Admission chemistries unremarkable except glucose 209, BUN 26, creatinine 1.46, hemoglobin 14.7, hemoglobin A1c 7.1, SARS coronavirus negative. Recent CTs and brain MRIs demonstrated diffuse atrophy negative for acute infarct. X-rays and imaging revealed severe spinal stenosis/myelopathy at C3-4, C4-5. Patient underwent decompressive cervical laminectomy and medial facetectomyC3-4 4-5 with posterior cervical arthrodesis C3-C6 with lateral mass fixation 03/05/2020 per Dr. Sherley Bounds. Cervical soft collar as directed. Decadron protocol initiated. Tolerating a regular consistency diet. Therapy evaluations completed and patient to be admitted for a comprehensive inpatient rehab program.  Glasgow Coma Scale Score: 15  Past Medical History      Past Medical History:  Diagnosis Date  . Alcohol abuse    11-15-2017  per pt last alcohol Dec 2018  . BPH with obstruction/lower urinary tract symptoms   . Chronic arm  pain   . Chronic pain    arms, leg, back  . CKD (chronic kidney disease), stage II   . Cocaine abuse ([place])    11-15-2017  per pt last used March 2019  . Congenital hydrocephalus, unspecified (HCC)    slow progression  . Diabetic retinopathy of both eyes ([place])   . Diastolic CHF, chronic ([place]) 07/2017  . Gait instability    multifactorial -- slow worsening congenital hydrocephalus, peripheral neuropathy, left C5-6 cord lesion  . Hematuria   . History of acute pulmonary edema 07/2017  . History of spinal cord injury 01/02/2014   pt fell, caused spinal cord contusion at C5-6--  residual left side weakness    . 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 History of TIA (transient ischemic attack) and stroke    hx cva 12-20-2010 and  TIA 02-22-2011--- no residual's from cva or tia  . Hyperlipidemia   . Hypertension    followed by pcp  . 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 Lesion of bladder   . Peripheral edema    chronic LUE  . Peripheral neuropathy   . Type 2 diabetes mellitus ([place])    followed by dr Karle Plumber--  last A1c 6.8 on 11-06-2017  . Weak urinary stream     Family History  family history includes Cancer in his mother; Diabetes in his mother; Heart disease in his father; Hypertension in his father, mother, and sister; Stroke in his father.  Prior Rehab/Hospitalizations:  Has the patient had prior rehab or hospitalizations prior to admission? No  Has the patient had major surgery during 100 days prior to admission? Yes  Current Medications   Current Facility-Administered Medications:  .  0.9 %  sodium chloride infusion, 250 mL, Intravenous, Continuous, Eustace Moore, MD, Last Rate: 1 mL/hr at 03/05/20 2337, 250 mL at 03/05/20 2337 .  acetaminophen (TYLENOL) tablet 650 mg, 650 mg, Oral, Q4H PRN **OR** acetaminophen (TYLENOL)  suppository 650 mg, 650 mg, Rectal, Q4H PRN, Eustace Moore, MD .  atorvastatin (LIPITOR) tablet 40 mg, 40 mg, Oral, Daily, Eustace Moore, MD, 40 mg at 03/11/20 0951 .  baclofen (LIORESAL) tablet  5 mg, 5 mg, Oral, TID, Eustace Moore, MD, 5 mg at 03/11/20 0951 .  dexamethasone (DECADRON) injection 1 mg, 1 mg, Intravenous, Q8H **OR** dexamethasone (DECADRON) tablet 1 mg, 1 mg, Oral, Q8H, Eustace Moore, MD .  DULoxetine (CYMBALTA) DR capsule 60 mg, 60 mg, Oral, Daily, Eustace Moore, MD, 60 mg at 03/11/20 0951 .  furosemide (LASIX) tablet 40 mg, 40 mg, Oral, Daily, Eustace Moore, MD, 40 mg at 03/11/20 0951 .  gabapentin (NEURONTIN) capsule 300 mg, 300 mg, Oral, BID, Eustace Moore, MD, 300 mg at 03/11/20 0951 .  hydrALAZINE (APRESOLINE) tablet 25 mg, 25 mg, Oral, BID, Eustace Moore, MD, 25 mg at 03/11/20 0954 .  insulin aspart (novoLOG) injection 0-20 Units, 0-20 Units, Subcutaneous, TID WC, Donne Hazel, MD, 4 Units at 03/11/20 0804 .  insulin aspart (novoLOG) injection 0-5 Units, 0-5 Units, Subcutaneous, QHS, Donne Hazel, MD, 2 Units at 03/10/20 2225 .  insulin aspart (novoLOG) injection 12 Units, 12 Units, Subcutaneous, TID WC, Donne Hazel, MD, 12 Units at 03/11/20 [phone] .  insulin glargine (LANTUS) injection 55 Units, 55 Units, Subcutaneous, Daily, Donne Hazel, MD, 55 Units at 03/11/20 0951 .  labetalol (NORMODYNE) tablet 100 mg, 100 mg, Oral, BID, Eustace Moore, MD, 100 mg at 03/11/20 0951 .  lisinopril (ZESTRIL) tablet 40 mg, 40 mg, Oral, Daily, Eustace Moore, MD, 40 mg at 03/11/20 0950 .  menthol-cetylpyridinium (CEPACOL) lozenge 3 mg, 1 lozenge, Oral, PRN **OR** phenol (CHLORASEPTIC) mouth spray 1 spray, 1 spray, Mouth/Throat, PRN, Eustace Moore, MD .  morphine 2 MG/ML injection 2 mg, 2 mg, Intravenous, Q2H PRN, Eustace Moore, MD, 2 mg at 03/05/20 2338 .  ondansetron (ZOFRAN) tablet 4 mg, 4 mg, Oral, Q6H PRN **OR** ondansetron (ZOFRAN) injection 4 mg, 4 mg, Intravenous,  Q6H PRN, Eustace Moore, MD .  oxyCODONE (Oxy IR/ROXICODONE) immediate release tablet 10 mg, 10 mg, Oral, Q3H PRN, Eustace Moore, MD, 10 mg at 03/11/20 3244 .  senna (SENOKOT) tablet 8.6 mg, 1 tablet, Oral, BID, Eustace Moore, MD, 8.6 mg at 03/11/20 0951 .  sodium chloride flush (NS) 0.9 % injection 3 mL, 3 mL, Intravenous, Q12H, Eustace Moore, MD, 3 mL at 03/11/20 0953 .  sodium chloride flush (NS) 0.9 % injection 3 mL, 3 mL, Intravenous, PRN, Eustace Moore, MD  Patients Current Diet:     Diet Order                  Diet heart healthy/carb modified Room service appropriate? Yes; Fluid consistency: Thin  Diet effective now                  Precautions / Restrictions Precautions Precautions: Cervical Precaution Booklet Issued: Yes (comment) Precaution Comments: reviewed precautions with patinet handout in room Cervical Brace: Soft collar (pt reports it doesn't help, so did not donn) Restrictions Weight Bearing Restrictions: No   Has the patient had 2 or more falls or a fall with injury in the past year?No  Prior Activity Level Household: goes to doctor's appointments every 3 months  Prior Functional Level Prior Function Level of Independence: Needs assistance Gait / Transfers Assistance Needed: Walker and cane for gait. Walker when out of home. ADL's / Homemaking Assistance Needed: reports aides assist with IADLs, showers, and dressing; reports does tolieting but sometimes not able to get to bathroom in time Comments: Has aide 7 days a week for about 2  hours in am; Pt reports weakness/numbness in extremities (L worse than right) chronically but has worsened over the past few weeks  Self Care: Did the patient need help bathing, dressing, using the toilet or eating?  Needed some help  Indoor Mobility: Did the patient need assistance with walking from room to room (with or without device)? Independent  Stairs: Did the patient need assistance with internal or  external stairs (with or without device)? Needed some help  Functional Cognition: Did the patient need help planning regular tasks such as shopping or remembering to take medications? Pt reported can remember to take medications. Pt also reported that he rarely leaves house.  Home Assistive Devices / Equipment Home Assistive Devices/Equipment: C-collar, CBG Meter, Walker (specify type) Home Equipment: Cane - single point, Walker - 2 wheels, Bedside commode  Prior Device Use: Indicate devices/aids used by the patient prior to current illness, exacerbation or injury? Walker  Current Functional Level Cognition  Overall Cognitive Status: No family/caregiver present to determine baseline cognitive functioning Orientation Level: Oriented X4 Following Commands: Follows one step commands consistently, Follows one step commands with increased time, Follows multi-step commands consistently General Comments: unsure of cognitive baseline.  Answered questions normally, a bit slow to process, but not significantly.     Extremity Assessment (includes Sensation/Coordination)  Upper Extremity Assessment: RUE deficits/detail, LUE deficits/detail RUE Deficits / Details: grossly 3-/5, assessed functionally (to 90* FF only) RUE Sensation: WNL RUE Coordination: decreased fine motor, decreased gross motor LUE Deficits / Details: grossly 3-/5, assessed functionally (to 90* FF only) LUE Sensation: WNL LUE Coordination: decreased fine motor, decreased gross motor  Lower Extremity Assessment: Defer to PT evaluation RLE Deficits / Details: Greater strength distally than proximal. Knee flexion, hip flexion, hip abduction grossly 3-4/5 RLE Sensation: decreased light touch (can feel but diminished and with tingling throughtout LE) LLE Deficits / Details: Greater strength distally than proximal. Knee flexion, hip flexion, hip abduction grossly 3-4/5 LLE Sensation: decreased light touch (can feel but diminished  and with tingling throughtout LE)    ADLs  Overall ADL's : Needs assistance/impaired Eating/Feeding: Set up, Sitting Eating/Feeding Details (indicate cue type and reason): able to open pancake syrup but otherwise needs assist to open packages and cut food  Grooming: Wash/dry hands, Set up, Sitting, Wash/dry face Grooming Details (indicate cue type and reason): pt requiring use of bil hands to perform task given UE weakness  Upper Body Bathing: Minimal assistance, Sitting Lower Body Bathing: +2 for physical assistance, +2 for safety/equipment, Sit to/from stand, Maximal assistance Upper Body Dressing : Minimal assistance, Sitting Lower Body Dressing: +2 for physical assistance, +2 for safety/equipment, Total assistance, Sit to/from stand Toilet Transfer: Moderate assistance, +2 for physical assistance, +2 for safety/equipment, Ambulation, BSC, RW Toilet Transfer Details (indicate cue type and reason): to recliner, +2 for safety due to LE buckling, decreased eccentric control Toileting- Clothing Manipulation and Hygiene: Total assistance, +2 for physical assistance, +2 for safety/equipment, Sit to/from stand Toileting - Clothing Manipulation Details (indicate cue type and reason): incontinent of BM during minimal ambulation, total assist for hygiene  Functional mobility during ADLs: Moderate assistance, Minimal assistance, +2 for safety/equipment, +2 for physical assistance, Rolling walker, Cueing for safety General ADL Comments: pt limited by neck pain, generalized weakness and impaired balance    Mobility  Overal bed mobility: Needs Assistance Bed Mobility: Rolling, Sidelying to Sit Rolling: Min assist Sidelying to sit: Mod assist Supine to sit: Min assist, Mod assist Sit to supine: Mod assist Sit to sidelying:  Max assist, +2 for physical assistance General bed mobility comments: Min assist to help pt reach for bed rail and roll to his left, cues for log roll including knee flexion.   Pt needed mod assist to support trunk to come up to sitting EOB.  Pt stopped half way up due to pain.     Transfers  Overall transfer level: Needs assistance Equipment used: Rolling walker (2 wheeled) Transfer via Lift Equipment: Marketing executive Transfers: Sit to/from Stand, Technical brewer Transfers Sit to Stand: Mod assist, From elevated surface Stand pivot transfers: Mod assist, From elevated surface General transfer comment: Sit to stand to RW from elevated bed mod assist with one leg blocked for safety.  Arm rest on chair down and pt taking pivotal steps to the chair.  He has difficulty lifting feet instead of sliding feet and had uncontrolled descent without the ability to reach back to the chair.      Ambulation / Gait / Stairs / Wheelchair Mobility  Ambulation/Gait Ambulation/Gait assistance: Min assist, +2 safety/equipment Gait Distance (Feet): 5 Feet Assistive device: Rolling walker (2 wheeled) ([name]) Gait Pattern/deviations: Step-to pattern, Decreased stride length, Shuffle, Ataxic, Narrow base of support, Drifts right/left General Gait Details: would be much safer with chair to follow and second person assisting.  Gait velocity: decreased Gait velocity interpretation: <1.31 ft/sec, indicative of household ambulator    Posture / Balance Dynamic Sitting Balance Sitting balance - Comments: needs bil UE supported to maintain sitting, otherwise he leans left.  Balance Overall balance assessment: Needs assistance Sitting-balance support: Feet supported, Bilateral upper extremity supported Sitting balance-Leahy Scale: Poor Sitting balance - Comments: needs bil UE supported to maintain sitting, otherwise he leans left.  Postural control: Left lateral lean Standing balance support: Bilateral upper extremity supported Standing balance-Leahy Scale: Poor Standing balance comment: needs external support from RW and therapist, cues to extend head neck, trunk, hips and knees in standing.      Special needs/care consideration Continuous Drip IV  0.9% sodium chloride infusion: 56mL/hr; 0.9% NaCl with KCl 20 mEq/L infusion: 75 mL/hr, Skin surgical incision: cervical, Urinary catheter, Bowel incontinence, Hemovac: posterior neck accordion and Diabetic management Lantus 40 units daily; novoLOG 8 units 3x daily with meals; novoLOG 0-5 units daily at bedtime; novoLOG 0-15  units 3x daily with meals     Previous Home Environment (from acute therapy documentation) Living Arrangements: Alone  Lives With: Alone Available Help at Discharge: Personal care attendant Type of Home: Apartment Home Layout: One level Home Access: Level entry Bathroom Shower/Tub: Chiropodist: Standard Bathroom Accessibility: Yes How Accessible: Accessible via walker [place]: Yes Type of Home Care Services: [place] (if known): pt cannot recall  Additional Comments: pt reported aide comes 7 days/week for 2.25 hours  Discharge Living Setting Plans for Discharge Living Setting: Patient's home Type of Home at Discharge: Apartment Discharge Home Layout: One level Discharge Home Access: Level entry Discharge Bathroom Shower/Tub: Tub/shower unit Discharge Bathroom Toilet: Standard Discharge Bathroom Accessibility: Yes How Accessible: Accessible via walker Does the patient have any problems obtaining your medications?: No  Social/Family/Support Systems Patient Roles: Other (Comment) (Has a sister.) Contact Information: Gaven Eugene - sister - [phone] Anticipated Caregiver: self - has mod I goals Caregiver Availability: Intermittent Discharge Plan Discussed with Primary Caregiver: Yes (spoke with his sister, Dorena Cookey) Is Caregiver In Agreement with Plan?: Yes Does Caregiver/Family have Issues with Lodging/Transportation while Pt is in Rehab?: No   Goals Patient/Family Goal for  Rehab: Mod I: PT/OT, I: ST Expected length of stay: 20-22  days Cultural Considerations: None Pt/Family Agrees to Admission and willing to participate: Yes Program Orientation Provided & Reviewed with Pt/Caregiver Including Roles  & Responsibilities: Yes   Decrease burden of Care through IP rehab admission: Decrease number of caregivers, Bowel and bladder program, Patient/family education and Other if patient is unable to reach mod I goals for safe discharge home   Possible need for SNF placement upon discharge: Yes, SNF is planned at discharge d/t lack of social supports at the time of DC.   Patient Condition: This patient's medical and functional status has changed since the consult dated: 03/06/20 in which the Rehabilitation Physician determined and documented that the patient's condition is appropriate for intensive rehabilitative care in an inpatient rehabilitation facility. See "History of Present Illness" (above) for medical update. Functional changes are: Currently requiring mod Assist for transfers and min to mod assist for most ADLs. Patient's medical and functional status update has been discussed with the Rehabilitation physician and patient remains appropriate for inpatient rehabilitation. Will admit to inpatient rehab today.  Preadmission Screen Completed By:  Retta Diones, RN, 03/11/2020 12:15 PM ______________________________________________________________________   Discussed status with Dr. Naaman Plummer and D. Lavorn on 03/11/20 at 1000 am and received approval for admission today.  Admission Coordinator:  Retta Diones, time 1209/Date 03/11/20           Cosigned by: Courtney Heys, MD at 03/11/2020 12:17 PM  Revision History                                                        Note Details  Author Retta Diones, RN File Time 03/11/2020 12:15 PM  Author Type Rehab Admission Coordinator Status Signed  Last Editor Retta Diones, RN Service Physical Medicine and Rehabilitation

## 2020-03-11 NOTE — Progress Notes (Signed)
Inpatient Rehabilitation Medication Review by a Pharmacist  A complete drug regimen review was completed for this patient to identify any potential clinically significant medication issues.  Clinically significant medication issues were identified:  no  Check AMION for pharmacist assigned to patient if future medication questions/issues arise during this admission.  Pharmacist comments:  Prior to admission  medications dapagliflozin, glipizide xl, metformin not resumed yet.  Inpatien currently patient on resistant scale SSI,  Novolog 12 units TID with meals and Lanuts 55 units daily.   Time spent performing this drug regimen review (minutes): 10   Nicole Cella [facility] 03/11/2020 10:22 PM

## 2020-03-11 NOTE — Progress Notes (Signed)
Curtis Ribas, MD  Physician  Physical Medicine and Rehabilitation  Consult Note    Signed  Date of Service:  03/06/2020 4:17 PM      Related encounter: ED to Hosp-Admission (Current) from 02/25/2020 in [facility]      Signed      Expand All Collapse All  Show:Clear all '[x]' Manual'[x]' Template'[]' Copied  Added by: '[x]' Raulkar, Clide Deutscher, MD  '[]' Hover for details      Physical Medicine and Rehabilitation Consult Reason for Consult: Impaired mobility and ADLs secondary to cervical spinal cord compression Referring Physician: Marylu Lund, MD   HPI: [name] is a 59 y.o. 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 He came to the ED due to worsening weakness. He underwent decompressive cervical laminectomy and medial facetectomy of C3-C4 and C4-C5 and posterior cervical arthrodesis at C3-C6. Physical Medicine & Rehabilitation was consulted to assess candidacy for CIR.   Review of Systems  Constitutional: Negative for chills and fever.  HENT: Negative for hearing loss and tinnitus.   Eyes: Positive for blurred vision.  Respiratory: Negative for cough and hemoptysis.   Cardiovascular: Negative for chest pain and palpitations.  Gastrointestinal: Negative for heartburn and nausea.  Genitourinary: Negative for dysuria and urgency.  Musculoskeletal: Positive for neck pain.  Skin: Negative for itching and rash.  Neurological: Positive for weakness. Negative for dizziness, seizures, loss of consciousness and headaches.  Endo/Heme/Allergies: Negative for environmental allergies. Does not bruise/bleed easily.  Psychiatric/Behavioral: Negative for depression and suicidal ideas.       Past Medical History:  Diagnosis Date  . Alcohol abuse    11-15-2017  per pt last alcohol Dec 2018  . BPH with obstruction/lower urinary tract symptoms   . Chronic arm pain   . Chronic pain    arms, leg,  back  . CKD (chronic kidney disease), stage II   . Cocaine abuse ([place])    11-15-2017  per pt last used March 2019  . Congenital hydrocephalus, unspecified (HCC)    slow progression  . Diabetic retinopathy of both eyes ([place])   . Diastolic CHF, chronic ([place]) 07/2017  . Gait instability    multifactorial -- slow worsening congenital hydrocephalus, peripheral neuropathy, left C5-6 cord lesion  . Hematuria   . History of acute pulmonary edema 07/2017  . History of spinal cord injury 01/02/2014   pt fell, caused spinal cord contusion at C5-6--  residual left side weakness    . 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 History of TIA (transient ischemic attack) and stroke    hx cva 12-20-2010 and  TIA 02-22-2011--- no residual's from cva or tia  . Hyperlipidemia   . Hypertension    followed by pcp  . 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 Lesion of bladder   . Peripheral edema    chronic LUE  . Peripheral neuropathy   . Type 2 diabetes mellitus ([place])    followed by dr Karle Plumber--  last A1c 6.8 on 11-06-2017  . Weak urinary stream    Past Surgical History:  Procedure Laterality Date  . CARDIAC CATHETERIZATION  12/01/2008   dr hochrein   abnormal stress myoview:  mild coronary plaque, normal LVF  . CARDIAC CATHETERIZATION  04/29/2011   dr [place]   in setting ECG with new ST elevation & severe hypertensive:  nonobstructive atherosclerotic CAD, normal LVF (  30% mRCA)   . CARDIOVASCULAR STRESS TEST  08/15/2010   Low nuclear study w/ no evidence ishemia/  ef 42% with lateral and apical hypokinesis  . CYSTOSCOPY WITH BIOPSY N/A 11/20/2017   Procedure: CYSTOSCOPY WITH BIOPSY AND FULGURATION;  Surgeon: [name], MD;  Location: [facility];  Service: Urology;  Laterality: N/A;  . INCISION AND DRAINAGE RIGHT DISTAL MEDIAL THIGH HEMATOMA   04-14-2005   dr Ninfa Linden  . left arm skin graft  1976   injury  . LEFT HEART CATHETERIZATION WITH CORONARY ANGIOGRAM Bilateral 12/26/2013   Procedure: LEFT HEART CATHETERIZATION WITH CORONARY ANGIOGRAM;  Surgeon: Burnell Blanks, MD;  Location: [facility] CATH LAB;  Service: Cardiovascular;  Laterality: Bilateral;  STEMI, in setting cocaine/ alcohol :  mid disease in the RCA (20%), moderate disease in the small caliber intermediate branch (distal 50-60%), normal LVSF (ef 55-60%)  . TOOTH EXTRACTION N/A 05/16/2018   Procedure: DENTAL RESTORATION/EXTRACTIONS WITH ALVEO;  Surgeon: Diona Browner, DDS;  Location: [facility];  Service: Oral Surgery;  Laterality: N/A;  . TRANSTHORACIC ECHOCARDIOGRAM  08/05/2017   moderate LVH,  ef 55%,  grade 1 diastolic dysfunction/  mild LAE/  trivial Tr        Family History  Problem Relation Age of Onset  . Hypertension Mother   . Diabetes Mother   . Cancer Mother        breast cancer   . Hypertension Father   . Heart disease Father   . Stroke Father   . Hypertension Sister    Social History:  reports that he quit smoking about 2 years ago. His smoking use included cigarettes. He quit after 45.00 years of use. He has never used smokeless tobacco. He reports previous alcohol use. He reports previous drug use. Drugs: "Crack" cocaine and Cocaine. Allergies: No Known Allergies       Medications Prior to Admission  Medication Sig Dispense Refill  . acetaminophen (TYLENOL) 500 MG tablet TAKE ONE TABLET BY MOUTH TWICE A DAY AS NEEDED (Patient taking differently: Take 500 mg by mouth 2 (two) times daily as needed. ) 60 tablet 0  . ASPIR-LOW 81 MG EC tablet TAKE 1 TABLET (81 MG TOTAL) BY MOUTH DAILY. (Patient taking differently: Take 81 mg by mouth daily. ) 30 tablet 3  . atorvastatin (LIPITOR) 40 MG tablet TAKE 1.5 TABLETS BY MOUTH DAILY (Patient taking differently: Take 40 mg by mouth daily. ) 45 tablet 2  . blood glucose meter kit and  supplies KIT Dispense based on patient and insurance preference. Use once daily as directed. (E11.9) (Patient taking differently: Inject 1 each into the skin See admin instructions. Dispense based on patient and insurance preference. Use once daily as directed. (E11.9)) 1 each 0  . Blood Glucose Monitoring Suppl (ACCU-CHEK GUIDE ME) w/Device KIT 1 kit by Does not apply route 3 (three) times daily. Use as instructed to check blood sugar 3 times daily. E11.14 (Patient taking differently: 1 kit by Does not apply route See admin instructions. Use as instructed to check blood sugar 3 times daily. E11.14) 1 kit 0  . dapagliflozin propanediol (FARXIGA) 10 MG TABS tablet Take 1 tablet (10 mg total) by mouth daily before breakfast. 30 tablet 6  . DULoxetine (CYMBALTA) 60 MG capsule TAKE ONE CAPSULE BY MOUTH DAILY FOR LEG AND BACK PAINS (Patient taking differently: Take 60 mg by mouth daily. ) 30 capsule 1  . furosemide (LASIX) 40 MG tablet TAKE ONE TABLET BY MOUTH DAILY .  TAKE EXTRA TABLET FOR TWO DAYS AS NEEDED FOR INCREASE LOWER EXTREMITY SWELLING (Patient taking differently: Take 40 mg by mouth See admin instructions. TAKE ONE TABLET BY MOUTH DAILY . TAKE EXTRA TABLET FOR TWO DAYS AS NEEDED FOR INCREASE LOWER EXTREMITY SWELLING) 40 tablet 2  . gabapentin (NEURONTIN) 300 MG capsule Take 1 capsule (300 mg total) by mouth 2 (two) times daily. 60 capsule 6  . glipiZIDE (GLUCOTROL XL) 10 MG 24 hr tablet Take 1 tablet (10 mg total) by mouth daily with breakfast. 30 tablet 6  . glucose blood (ACCU-CHEK GUIDE) test strip Use as instructed to check blood sugar 3 times daily. E11.14 (Patient taking differently: 1 each by Other route See admin instructions. Use as instructed to check blood sugar 3 times daily. E11.14) 100 each 11  . hydrALAZINE (APRESOLINE) 10 MG tablet TAKE ONE TABLET BY MOUTH TWICE A DAY (Patient taking differently: Take 10 mg by mouth in the morning and at bedtime. ) 60 tablet 2  . labetalol (NORMODYNE)  100 MG tablet TAKE ONE TABLET BY MOUTH TWICE A DAY (Patient taking differently: Take 100 mg by mouth 2 (two) times daily. ) 60 tablet 2  . Lancets Misc. (ACCU-CHEK SOFTCLIX LANCET DEV) KIT Use as directed (Patient taking differently: 1 each by Other route as directed. ) 1 kit 0  . Lancets MISC Use as instructed to check blood sugar 3 times daily. E11.14 (Patient taking differently: 1 each by Other route See admin instructions. Use as instructed to check blood sugar 3 times daily. E11.14) 100 each 11  . lisinopril (ZESTRIL) 40 MG tablet Take 1 tablet (40 mg total) by mouth daily. 30 tablet 6  . loratadine (CLARITIN) 10 MG tablet TAKE ONE TABLET BY MOUTH DAILY AS NEEDED FOR ALLERGIES (Patient taking differently: Take 10 mg by mouth daily as needed for allergies. ) 30 tablet 5  . metFORMIN (GLUCOPHAGE) 1000 MG tablet TAKE ONE TABLET BY MOUTH TWICE A DAY WITH A MEAL. (Patient taking differently: Take 1,000 mg by mouth 2 (two) times daily with a meal. ) 180 tablet 1  . vitamin B-12 (CYANOCOBALAMIN) 500 MCG tablet Take 1 tablet (500 mcg total) by mouth daily. 100 tablet 1    Home: Home Living Family/patient expects to be discharged to:: Private residence Living Arrangements: Alone Available Help at Discharge: Family, Personal care attendant Type of Home: Apartment Home Access: Level entry Home Layout: One level Bathroom Shower/Tub: Chiropodist: Standard Home Equipment: Cane - single point, Environmental consultant - 2 wheels, Bedside commode Additional Comments: aide comes 3 days a week for 2 hours  Functional History: Prior Function Level of Independence: Needs assistance Gait / Transfers Assistance Needed: Walker and cane for gait. Walker when out of home. ADL's / Homemaking Assistance Needed: reports aides assist with IADLs, showers, and dressing; reports does tolieting but sometimes not able to get to bathroom in time Comments: Has aide 7 days a week for about 2 hours in am; Pt reports  weakness/numbness in extremities (L worse than right) chronically but has worsened over the past few weeks Functional Status:  Mobility: Bed Mobility Overal bed mobility: Needs Assistance Bed Mobility: Rolling, Sidelying to Sit Rolling: Min assist Sidelying to sit: Mod assist Supine to sit: Min assist, Mod assist Sit to supine: Mod assist General bed mobility comments: Educated on log roll technique, pillow to maintain neutral. Min assist rolling towards left with hand on rail for support. Mod assist for truncal support boosting to seated position. Transfers Overall  transfer level: Needs assistance Equipment used: Rolling walker (2 wheeled) Transfer via Lift Equipment: Marketing executive Transfers: Sit to/from Stand, Risk manager Sit to Stand: Min assist, From elevated surface Stand pivot transfers: Min assist General transfer comment: Performed with mod assist initially progressed to min assist with bed elevated. Educated on technique. Able to keep hands on RW, pushing primarily with LEs. Pivot to recliner with min assist for RW navigation. Ambulation/Gait Ambulation/Gait assistance: Min assist, +2 safety/equipment, Mod assist Gait Distance (Feet): 25 Feet Assistive device:  ([name]) Gait Pattern/deviations: Step-to pattern, Decreased stride length, Shuffle, Ataxic, Narrow base of support, Drifts right/left General Gait Details: Pt was able to progress steps in [place] plus walking to door and peeked into hallway.  Left the knee pad in place to prevent buckling.  Pt took small steps but almost had to due to the [place] plus set up.  Gait velocity: decreased Gait velocity interpretation: <1.31 ft/sec, indicative of household ambulator  ADL: ADL Overall ADL's : Needs assistance/impaired Eating/Feeding: Set up, Sitting Eating/Feeding Details (indicate cue type and reason): requires setup to open containers end of session  Grooming: Wash/dry face, Min guard, Sitting Grooming Details  (indicate cue type and reason): pt requiring use of bil hands to perform task given UE weakness  Upper Body Bathing: Sitting, Minimal assistance Lower Body Bathing: Maximal assistance, Sitting/lateral leans, Sit to/from stand Upper Body Dressing : Minimal assistance, Sitting Lower Body Dressing: Maximal assistance, Sit to/from stand, Sitting/lateral leans Toilet Transfer: Minimal assistance, +2 for safety/equipment, BSC, Stand-pivot, Cueing for safety Toilet Transfer Details (indicate cue type and reason): to recliner, +2 for safety due to LE buckling, decreased eccentric control Toileting- Clothing Manipulation and Hygiene: Maximal assistance, Sit to/from stand Toileting - Clothing Manipulation Details (indicate cue type and reason): pt attempting to use urinal at EOB, supervision for balance  Functional mobility during ADLs: Minimal assistance, +2 for safety/equipment, Rolling walker, Cueing for safety General ADL Comments: pt tolerating EOB activity today including seated ADL and UB/LB AROM  Cognition: Cognition Overall Cognitive Status: Impaired/Different from baseline Orientation Level: Oriented X4 Cognition Arousal/Alertness: Awake/alert Behavior During Therapy: WFL for tasks assessed/performed Overall Cognitive Status: Impaired/Different from baseline Area of Impairment: Memory Memory: Decreased recall of precautions, Decreased short-term memory General Comments: Calling nurses station, forgetting why.  Blood pressure (!) 175/69, pulse 60, temperature 98.2 F (36.8 C), temperature source Oral, resp. rate 16, height '6\' 3"'  (1.905 m), weight 112 kg, SpO2 99 %. Physical Exam   General: Alert and oriented x 3, No apparent distress HEENT: Head is normocephalic, atraumatic, PERRLA, EOMI, sclera anicteric, oral mucosa pink and moist, dentition intact, ext ear canals clear,  Neck: Supple without JVD or lymphadenopathy Heart: Reg rate and rhythm. No murmurs rubs or gallops Chest: CTA  bilaterally without wheezes, rales, or rhonchi; no distress Abdomen: Soft, non-tender, non-distended, bowel sounds positive. Extremities: No clubbing, cyanosis, or edema. Pulses are 2+ Skin: Clean and intact without signs of breakdown Neuro: Pt is cognitively appropriate with normal insight, memory, and awareness. Cranial nerves 2-12 are intact. Sensory exam is normal.  Fine motor coordination is intact. No tremors. Motor function is grossly 4/5 in lower extremities and 3/5 in upper extremities.  Psych: Pt's affect is appropriate. Pt is cooperative   Lab Results Last 24 Hours        Results for orders placed or performed during the hospital encounter of 02/25/20 (from the past 24 hour(s))  Aerobic Culture (superficial specimen)     Status: None (Preliminary result)  Collection Time: 03/05/20  4:52 PM   Specimen: Abscess  Result Value Ref Range   Specimen Description ABSCESS    Special Requests POSTERIOR CERVICAL    Gram Stain NO WBC SEEN NO ORGANISMS SEEN     Culture      NO GROWTH < 24 HOURS Performed at [facility], [place] [address] [place], [place] [address]    Report Status PENDING   Glucose, capillary     Status: Abnormal   Collection Time: 03/05/20  6:35 PM  Result Value Ref Range   Glucose-Capillary 120 (H) 70 - 99 mg/dL  Glucose, capillary     Status: Abnormal   Collection Time: 03/05/20 10:09 PM  Result Value Ref Range   Glucose-Capillary 179 (H) 70 - 99 mg/dL   Comment 1 Notify RN    Comment 2 Document in Chart   Glucose, capillary     Status: Abnormal   Collection Time: 03/06/20  6:05 AM  Result Value Ref Range   Glucose-Capillary 304 (H) 70 - 99 mg/dL  Glucose, capillary     Status: Abnormal   Collection Time: 03/06/20 12:05 PM  Result Value Ref Range   Glucose-Capillary 154 (H) 70 - 99 mg/dL      Imaging Results (Last 48 hours)  DG Cervical Spine 2-3 Views  Result Date: 03/05/2020 CLINICAL DATA:  Surgery, elective.  C3-6 posterior fusion. Fluoroscopy time 12 seconds, 3.45 mGy EXAM: CERVICAL SPINE - 2-3 VIEW; DG C-ARM 1-60 MIN COMPARISON:  MRI of the cervical spine 02/26/2020 FINDINGS: Three intraoperative fluoroscopic images of the cervical spine are submitted. A posterior spinal fusion construct is now present at the C3-C6 levels. Superimposition of the shoulders limits evaluation of the C4 and more caudal levels on the lateral view images. Additionally, overlying artifact partially obscures the lower aspect of the right-sided fusion hardware on the PA image. Within these limitations, there is no unexpected finding. Partially visualized support tubes. IMPRESSION: Three intraoperative fluoroscopic images of the cervical spine as above. Electronically Signed   By: Kellie Simmering DO   On: 03/05/2020 18:13   DG C-Arm 1-60 Min  Result Date: 03/05/2020 CLINICAL DATA:  Surgery, elective. C3-6 posterior fusion. Fluoroscopy time 12 seconds, 3.45 mGy EXAM: CERVICAL SPINE - 2-3 VIEW; DG C-ARM 1-60 MIN COMPARISON:  MRI of the cervical spine 02/26/2020 FINDINGS: Three intraoperative fluoroscopic images of the cervical spine are submitted. A posterior spinal fusion construct is now present at the C3-C6 levels. Superimposition of the shoulders limits evaluation of the C4 and more caudal levels on the lateral view images. Additionally, overlying artifact partially obscures the lower aspect of the right-sided fusion hardware on the PA image. Within these limitations, there is no unexpected finding. Partially visualized support tubes. IMPRESSION: Three intraoperative fluoroscopic images of the cervical spine as above. Electronically Signed   By: Kellie Simmering DO   On: 03/05/2020 18:13      Assessment/Plan: Diagnosis: Cervical spinal cord compression s/p cervical laminectomy and medial facetectomy C3-C4, C4-C5,and posterior cervical arthrodesis C3-C6.  1. Does the need for close, 24 hr/day medical supervision in concert with the  patient's rehab needs make it unreasonable for this patient to be served in a less intensive setting? Yes 2. Co-Morbidities requiring supervision/potential complications: Numbness and weakness in bilateral upper and lower extremities, HLD, HTN, Type 2 DM, diastolic CHF 3. Due to bladder management, bowel management, safety, skin/wound care, disease management, medication administration, pain management and patient education, does the patient require 24 hr/day rehab nursing?  Yes 4. Does the patient require coordinated care of a physician, rehab nurse, therapy disciplines of PT, OT to address physical and functional deficits in the context of the above medical diagnosis(es)? Yes Addressing deficits in the following areas: balance, endurance, locomotion, strength, transferring, bowel/bladder control, bathing, dressing, feeding, grooming, toileting and psychosocial support 5. Can the patient actively participate in an intensive therapy program of at least 3 hrs of therapy per day at least 5 days per week? Yes 6. The potential for patient to make measurable gains while on inpatient rehab is excellent 7. Anticipated functional outcomes upon discharge from inpatient rehab are modified independent  with PT, modified independent with OT, independent with SLP. 8. Estimated rehab length of stay to reach the above functional goals is: 20-22 days 9. Anticipated discharge destination: Home 10. Overall Rehab/Functional Prognosis: excellent  RECOMMENDATIONS: This patient's condition is appropriate for continued rehabilitative care in the following setting: CIR Patient has agreed to participate in recommended program. Yes Note that insurance prior authorization may be required for reimbursement for recommended care.  Comment: Mr. Burkett would be an excellent CIR candidate. He lives alone. ACs to follow on Monday.   Curtis Ribas, MD 03/06/2020        Routing History           Note Details  Author  Ranell Patrick, Clide Deutscher, MD File Time 03/06/2020 4:26 PM  Author Type Physician Status Signed  Last Editor Curtis Ribas, MD Service Physical Medicine and Rehabilitation

## 2020-03-11 NOTE — Progress Notes (Signed)
Reviewed discharge instructions with pt; voiced understanding. Packed pt's personal belongings that were at bedside and transported with pt. Pt. transported via hospital bed to [id number] by staff. Pt. stated that he has belongings down in the security office; pt. was advised that he is not leaving facility at this time and it was ok to leave them where they are. Report was given to Antony Haste, Therapist, sports. Pt's peripheral IV left in place as requested by nurse

## 2020-03-11 NOTE — Progress Notes (Signed)
IP rehab admissions - I have a bed on rehab today and I have medical clearance for acute inpatient rehab admission.  Will admit to CIR today.  Call me for questions.  [phone]

## 2020-03-11 NOTE — Discharge Summary (Signed)
Physician Discharge Summary  [name] EKC:[id number] DOB: 01-07-1961 DOA: 02/25/2020  PCP: Ladell Pier, MD  Admit date: 02/25/2020 Discharge date: 03/11/2020  Admitted From: home Disposition:  CIR  Recommendations for Outpatient Follow-up:  1. Follow up with PCP / neurosurgery as an outpatient once discharged from [place]: none Equipment/Devices: none  Discharge Condition: stable CODE STATUS: Full code Diet recommendation: regular  HPI: Per admitting MD, [name] is a 59 y.o. male with medical history significant of hypertension, hyperlipidemia, diabetes mellitus, TIA, diabetic neuropathy, chronic diastolic CHF, cocaine abuse, tobacco abuse, presents to emergency department with worsening numbness and weakness in bilateral upper and lower extremities since 1 day. Patient tells me that he has diabetic neuropathy however since yesterday he has started feeling worsening of his numbness and tingling and weakness sensation in bilateral upper and lower extremities.  Tells me that he has difficulty lifting while picking up his utensils, unable to walk due to weakness.  Reports that his symptoms are persistent, no aggravating or relieving factors.  Denies association with recent trauma, fever, chills, headache, blurry vision, urinary, bowel incontinence. Denies chest pain, shortness of breath, palpitation, leg swelling, dizziness, lightheadedness, nausea, vomiting, abdominal pain, sleep or appetite changes. He smokes cigarettes every day, denies alcohol use however uses cocaine now and then.  His last cocaine use was 2 days ago.  He lives alone at home and uses cane and walker for ambulation.  Hospital Course / Discharge diagnoses: Principal Problem Cervical stenosis with cord compression, myelopathy, quadriparesis-he presented with worsening weakness.  An MRI on admission was positive for moderate spinal stenosis with cord compression C3-4 and C4-5 with associated cord  edema and multilevel degenerative changes in the thoracic spine.  Neurosurgery following, patient underwent  1. DeCompressive cervical laminectomy and medial facetectomy C3-4 C4-5, 2.Posterior cervical arthrodesis C3-C6 utilizing locally harvested morselized autologous bone graft and morselized allograft, 3. Lateral mass fixation C3-C6 inclusive utilizing Alphatec lateral mass screws  Recovered well postoperatively, currently on tapering doses of Decadron, will need to follow-up with neurosurgery  Active Problems Essential hypertension-continue furosemide, labetalol, lisinopril.  Blood pressure overall stable. Type 2 diabetes mellitus-A1c 7.1, currently on insulin as he is getting steroids, but with tapering doses of steroids I have decreased his insulin regimen on discharge, please continue to decrease insulin regimen as he comes off of steroids  CBG (last 3)  Recent Labs    03/10/20 1647 03/10/20 2028 03/11/20 0751  GLUCAP 73 213* 192*   Hyperlipidemia-continue atorvastatin  Chronic diastolic CHF-stable, appears euvolemic, continue beta-blocker, ACE, diuretics  Polysubstance abuse with cocaine, EtOH, tobacco-counseled regarding cessation, no withdrawal symptoms.  Last positive cocaine UDS on 11/08/2019  Discharge Instructions   Allergies as of 03/11/2020   No Known Allergies     Medication List    TAKE these medications   Accu-Chek Guide Me w/Device Kit 1 kit by Does not apply route 3 (three) times daily. Use as instructed to check blood sugar 3 times daily. E11.14 What changed: when to take this   Accu-Chek Guide test strip Generic drug: glucose blood Use as instructed to check blood sugar 3 times daily. E11.14 What changed:   how much to take  how to take this  when to take this   Accu-Chek Softclix Lancet Dev Kit Use as directed What changed:   how much to take  how to take this  when to take this  additional instructions   acetaminophen 500 MG  tablet Commonly  known as: TYLENOL TAKE ONE TABLET BY MOUTH TWICE A DAY AS NEEDED   Aspir-Low 81 MG EC tablet Generic drug: aspirin TAKE 1 TABLET (81 MG TOTAL) BY MOUTH DAILY. What changed: See the new instructions.   atorvastatin 40 MG tablet Commonly known as: LIPITOR TAKE 1.5 TABLETS BY MOUTH DAILY What changed: See the new instructions.   blood glucose meter kit and supplies Kit Dispense based on patient and insurance preference. Use once daily as directed. (E11.9) What changed:   how much to take  how to take this  when to take this   dapagliflozin propanediol 10 MG Tabs tablet Commonly known as: Farxiga Take 1 tablet (10 mg total) by mouth daily before breakfast.   dexamethasone 1 MG tablet Commonly known as: DECADRON Take 1 tablet (1 mg total) by mouth every 8 (eight) hours.   DULoxetine 60 MG capsule Commonly known as: CYMBALTA TAKE ONE CAPSULE BY MOUTH DAILY FOR LEG AND BACK PAINS What changed: See the new instructions.   furosemide 40 MG tablet Commonly known as: LASIX TAKE ONE TABLET BY MOUTH DAILY . TAKE EXTRA TABLET FOR TWO DAYS AS NEEDED FOR INCREASE LOWER EXTREMITY SWELLING What changed: See the new instructions.   gabapentin 300 MG capsule Commonly known as: NEURONTIN Take 1 capsule (300 mg total) by mouth 2 (two) times daily.   glipiZIDE 10 MG 24 hr tablet Commonly known as: GLUCOTROL XL Take 1 tablet (10 mg total) by mouth daily with breakfast.   hydrALAZINE 10 MG tablet Commonly known as: APRESOLINE TAKE ONE TABLET BY MOUTH TWICE A DAY What changed: when to take this   insulin aspart 100 UNIT/ML injection Commonly known as: novoLOG Inject 0-20 Units into the skin 3 (three) times daily with meals.   insulin aspart 100 UNIT/ML injection Commonly known as: novoLOG Inject 5 Units into the skin 3 (three) times daily with meals.   insulin glargine 100 UNIT/ML injection Commonly known as: LANTUS Inject 0.2 mLs (20 Units total) into the skin  daily. Start taking on: March 12, 2020   labetalol 100 MG tablet Commonly known as: NORMODYNE TAKE ONE TABLET BY MOUTH TWICE A DAY   Lancets Misc Use as instructed to check blood sugar 3 times daily. E11.14 What changed:   how much to take  how to take this  when to take this   lisinopril 40 MG tablet Commonly known as: ZESTRIL Take 1 tablet (40 mg total) by mouth daily.   loratadine 10 MG tablet Commonly known as: CLARITIN TAKE ONE TABLET BY MOUTH DAILY AS NEEDED FOR ALLERGIES What changed:   how much to take  how to take this  when to take this  reasons to take this  additional instructions   metFORMIN 1000 MG tablet Commonly known as: GLUCOPHAGE TAKE ONE TABLET BY MOUTH TWICE A DAY WITH A MEAL. What changed: See the new instructions.   Oxycodone HCl 10 MG Tabs Take 1 tablet (10 mg total) by mouth every 3 (three) hours as needed for severe pain ((score 7 to 10)).   vitamin B-12 500 MCG tablet Commonly known as: CYANOCOBALAMIN Take 1 tablet (500 mcg total) by mouth daily.       Follow-up Information    Eustace Moore, MD. Schedule an appointment as soon as possible for a visit in 3 week(s).   Specialty: Neurosurgery Contact information: [address] [place] [address] [place] [address] [phone]  Consultations:  Neurosurgery   Procedures/Studies: 1. DeCompressive cervical laminectomy and medial facetectomy C3-4 C4-5, 2.Posterior cervical arthrodesis C3-C6 utilizing locally harvested morselized autologous bone graft and morselized allograft, 3. Lateral mass fixation C3-C6 inclusive utilizing Alphatec lateral mass screws  DG Cervical Spine 2-3 Views  Result Date: 03/05/2020 CLINICAL DATA:  Surgery, elective. C3-6 posterior fusion. Fluoroscopy time 12 seconds, 3.45 mGy EXAM: CERVICAL SPINE - 2-3 VIEW; DG C-ARM 1-60 MIN COMPARISON:  MRI of the cervical spine 02/26/2020 FINDINGS: Three intraoperative fluoroscopic images of  the cervical spine are submitted. A posterior spinal fusion construct is now present at the C3-C6 levels. Superimposition of the shoulders limits evaluation of the C4 and more caudal levels on the lateral view images. Additionally, overlying artifact partially obscures the lower aspect of the right-sided fusion hardware on the PA image. Within these limitations, there is no unexpected finding. Partially visualized support tubes. IMPRESSION: Three intraoperative fluoroscopic images of the cervical spine as above. Electronically Signed   By: Kellie Simmering DO   On: 03/05/2020 18:13   MR Cervical Spine W or Wo Contrast  Result Date: 02/26/2020 CLINICAL DATA:  Bilateral arm and leg weakness. EXAM: MRI CERVICAL AND THORACIC SPINE WITHOUT AND WITH CONTRAST TECHNIQUE: Multiplanar and multiecho pulse sequences of the cervical spine, to include the craniocervical junction and cervicothoracic junction, and thoracic spine, were obtained without and with intravenous contrast. CONTRAST:  50m GADAVIST GADOBUTROL 1 MMOL/ML IV SOLN COMPARISON:  MRI cervical spine with contrast 04/27/2015. CT cervical spine 05/09/2019. CT cervical spine 12/26/2013 FINDINGS: MRI CERVICAL SPINE FINDINGS Alignment: Normal alignment Vertebrae: Negative for fracture or mass. Discogenic changes in the bone marrow at C5-6, C6-7, C7-T1. Mild bone marrow edema at C5-6 which appears degenerative. Findings are in not suggestive of discitis. Cord: Cord hyperintensity bilaterally at C5-6, left greater than right. This was present in 2016. There is cord compression at C3-4 and C4-5 due to circumferential narrowing of the canal. See below description. No other areas of cord signal abnormality. Posterior Fossa, vertebral arteries, paraspinal tissues: There is posterior ligamentous thickening and enhancement involving the ligamentum flavum at C3 and C4 left greater than right. In addition, there is low signal tissue within the ligamentum flavum on the left at C3  and C4 which does not enhance. This consistent with ligament calcification and was noted on the prior CT in 2020 but was not present in 2015 Disc levels: C2-3: Mild disc degeneration. Negative for spinal or foraminal stenosis C3-4: Disc degeneration with central and right-sided disc protrusion and associated spurring. Bilateral ligament thickening left greater than right which shows enhancement. There is moderate spinal stenosis with compression of the cord. There is mild to moderate foraminal stenosis bilaterally. C4-5: Disc degeneration with shallow central disc protrusion and diffuse uncinate spurring. Mild facet degeneration bilaterally. Moderate to severe spinal stenosis with compression of the cord left greater than right. Severe left foraminal encroachment and moderate right foraminal encroachment. Diffuse ligament thickening and enhancement. Calcification in the left ligamentum flavum is noted on prior CT. C5-6: Disc degeneration with diffuse uncinate facet degeneration. Spurring. Mild posterior ligament thickening and enhancement. Mild spinal stenosis. Cord hyperintensity bilaterally left greater than right. Moderate foraminal stenosis bilaterally C6-7: Disc degeneration with diffuse endplate spurring. Moderate foraminal encroachment bilaterally. C7-T1: Disc degeneration with edema in the endplates which show mild enhancement. There is diffuse endplate spurring and moderate foraminal stenosis bilaterally. MRI THORACIC SPINE FINDINGS Segmentation:  Normal Alignment:  Normal Vertebrae:  Negative for fracture or mass Spinal cord: Normal signal  and morphology. No cord compression. Mild vascular enhancement dorsal to the lower thoracic cord is most likely normal vessels. Paraspinal and other soft tissues: Negative for paraspinous mass or adenopathy. No paraspinous fluid collection. No pleural effusion. Disc levels: T1-2: Disc degeneration and spurring with foraminal narrowing bilaterally. T2-3: Mild spurring on  the left without significant stenosis T3-4: Disc degeneration and spurring on the right with mild right foraminal narrowing T4-5: Mild disc degeneration. Right-sided facet degeneration and mild right foraminal narrowing. T7-8: Mild disc degeneration and small to moderate left foraminal disc protrusion. T8-9: Disc and facet degeneration. Mild foraminal narrowing bilaterally T9-10: Disc degeneration and spurring. Bilateral facet hypertrophy. Right foraminal narrowing due to spurring. T10-11: Disc degeneration and facet degeneration. Foraminal narrowing bilaterally due to spurring. T11-12: Disc and facet degeneration. Mild foraminal narrowing bilaterally due to spurring. IMPRESSION: 1. Moderate spinal stenosis and cord compression at C3-4 and C4-5. This is due to a combination of disc degeneration and spurring as well as prominent ligamentum flavum hypertrophy bilaterally left greater right. The ligament show edema and enhancement C3 through C5. In addition, there is posterior ligament calcification at C3-4 and C4-5 contributing to stenosis. Infection is a consideration, white blood count slightly elevated. However more likely is a inflammatory process in the ligaments causing edema and enlargement. Chronic calcification ligament is noted on prior CT. 2. 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 This is relatively mild and unchanged from 2016 MRI. 3. Multilevel thoracic degenerative changes causing foraminal stenosis bilaterally. No cord compression. 4. These results were called by telephone at the time of interpretation on 02/26/2020 at 4:03 pm to provider [facility] , who verbally acknowledged these results. Electronically Signed   By: Franchot Gallo M.D.   On: 02/26/2020 16:04   MR THORACIC SPINE W WO CONTRAST  Result Date: 02/26/2020 CLINICAL DATA:  Bilateral arm and leg weakness. EXAM: MRI CERVICAL AND THORACIC SPINE WITHOUT AND WITH CONTRAST TECHNIQUE: Multiplanar and multiecho pulse  sequences of the cervical spine, to include the craniocervical junction and cervicothoracic junction, and thoracic spine, were obtained without and with intravenous contrast. CONTRAST:  72m GADAVIST GADOBUTROL 1 MMOL/ML IV SOLN COMPARISON:  MRI cervical spine with contrast 04/27/2015. CT cervical spine 05/09/2019. CT cervical spine 12/26/2013 FINDINGS: MRI CERVICAL SPINE FINDINGS Alignment: Normal alignment Vertebrae: Negative for fracture or mass. Discogenic changes in the bone marrow at C5-6, C6-7, C7-T1. Mild bone marrow edema at C5-6 which appears degenerative. Findings are in not suggestive of discitis. Cord: Cord hyperintensity bilaterally at C5-6, left greater than right. This was present in 2016. There is cord compression at C3-4 and C4-5 due to circumferential narrowing of the canal. See below description. No other areas of cord signal abnormality. Posterior Fossa, vertebral arteries, paraspinal tissues: There is posterior ligamentous thickening and enhancement involving the ligamentum flavum at C3 and C4 left greater than right. In addition, there is low signal tissue within the ligamentum flavum on the left at C3 and C4 which does not enhance. This consistent with ligament calcification and was noted on the prior CT in 2020 but was not present in 2015 Disc levels: C2-3: Mild disc degeneration. Negative for spinal or foraminal stenosis C3-4: Disc degeneration with central and right-sided disc protrusion and associated spurring. Bilateral ligament thickening left greater than right which shows enhancement. There is moderate spinal stenosis with compression of the cord. There is mild to moderate foraminal stenosis bilaterally. C4-5: Disc degeneration with shallow central disc protrusion and diffuse uncinate spurring. Mild facet  degeneration bilaterally. Moderate to severe spinal stenosis with compression of the cord left greater than right. Severe left foraminal encroachment and moderate right foraminal  encroachment. Diffuse ligament thickening and enhancement. Calcification in the left ligamentum flavum is noted on prior CT. C5-6: Disc degeneration with diffuse uncinate facet degeneration. Spurring. Mild posterior ligament thickening and enhancement. Mild spinal stenosis. Cord hyperintensity bilaterally left greater than right. Moderate foraminal stenosis bilaterally C6-7: Disc degeneration with diffuse endplate spurring. Moderate foraminal encroachment bilaterally. C7-T1: Disc degeneration with edema in the endplates which show mild enhancement. There is diffuse endplate spurring and moderate foraminal stenosis bilaterally. MRI THORACIC SPINE FINDINGS Segmentation:  Normal Alignment:  Normal Vertebrae:  Negative for fracture or mass Spinal cord: Normal signal and morphology. No cord compression. Mild vascular enhancement dorsal to the lower thoracic cord is most likely normal vessels. Paraspinal and other soft tissues: Negative for paraspinous mass or adenopathy. No paraspinous fluid collection. No pleural effusion. Disc levels: T1-2: Disc degeneration and spurring with foraminal narrowing bilaterally. T2-3: Mild spurring on the left without significant stenosis T3-4: Disc degeneration and spurring on the right with mild right foraminal narrowing T4-5: Mild disc degeneration. Right-sided facet degeneration and mild right foraminal narrowing. T7-8: Mild disc degeneration and small to moderate left foraminal disc protrusion. T8-9: Disc and facet degeneration. Mild foraminal narrowing bilaterally T9-10: Disc degeneration and spurring. Bilateral facet hypertrophy. Right foraminal narrowing due to spurring. T10-11: Disc degeneration and facet degeneration. Foraminal narrowing bilaterally due to spurring. T11-12: Disc and facet degeneration. Mild foraminal narrowing bilaterally due to spurring. IMPRESSION: 1. Moderate spinal stenosis and cord compression at C3-4 and C4-5. This is due to a combination of disc  degeneration and spurring as well as prominent ligamentum flavum hypertrophy bilaterally left greater right. The ligament show edema and enhancement C3 through C5. In addition, there is posterior ligament calcification at C3-4 and C4-5 contributing to stenosis. Infection is a consideration, white blood count slightly elevated. However more likely is a inflammatory process in the ligaments causing edema and enlargement. Chronic calcification ligament is noted on prior CT. 2. 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 This is relatively mild and unchanged from 2016 MRI. 3. Multilevel thoracic degenerative changes causing foraminal stenosis bilaterally. No cord compression. 4. These results were called by telephone at the time of interpretation on 02/26/2020 at 4:03 pm to provider [facility] , who verbally acknowledged these results. Electronically Signed   By: Franchot Gallo M.D.   On: 02/26/2020 16:04   DG C-Arm 1-60 Min  Result Date: 03/05/2020 CLINICAL DATA:  Surgery, elective. C3-6 posterior fusion. Fluoroscopy time 12 seconds, 3.45 mGy EXAM: CERVICAL SPINE - 2-3 VIEW; DG C-ARM 1-60 MIN COMPARISON:  MRI of the cervical spine 02/26/2020 FINDINGS: Three intraoperative fluoroscopic images of the cervical spine are submitted. A posterior spinal fusion construct is now present at the C3-C6 levels. Superimposition of the shoulders limits evaluation of the C4 and more caudal levels on the lateral view images. Additionally, overlying artifact partially obscures the lower aspect of the right-sided fusion hardware on the PA image. Within these limitations, there is no unexpected finding. Partially visualized support tubes. IMPRESSION: Three intraoperative fluoroscopic images of the cervical spine as above. Electronically Signed   By: Kellie Simmering DO   On: 03/05/2020 18:13     Subjective: - no chest pain, shortness of breath, no abdominal pain, nausea or vomiting.   Discharge Exam: BP  (!) 161/58 (BP Location: Left Arm)   Pulse 75  Temp 97.7 F (36.5 C) (Oral)   Resp 18   Ht '6\' 3"'  (1.905 m)   Wt 112.1 kg   SpO2 100%   BMI 30.89 kg/m   General: Pt is alert, awake, not in acute distress Cardiovascular: RRR, S1/S2 +, no rubs, no gallops Respiratory: CTA bilaterally, no wheezing, no rhonchi Abdominal: Soft, NT, ND, bowel sounds + Extremities: no edema, no cyanosis    The results of significant diagnostics from this hospitalization (including imaging, microbiology, ancillary and laboratory) are listed below for reference.     Microbiology: Recent Results (from the past 240 hour(s))  Surgical pcr screen     Status: Abnormal   Collection Time: 03/04/20  8:30 PM   Specimen: Nasal Mucosa; Nasal Swab  Result Value Ref Range Status   MRSA, PCR NEGATIVE NEGATIVE Final   Staphylococcus aureus POSITIVE (A) NEGATIVE Final    Comment: (NOTE) The Xpert SA Assay (FDA approved for NASAL specimens in patients 55 years of age and older), is one component of a comprehensive surveillance program. It is not intended to diagnose infection nor to guide or monitor treatment. Performed at [facility], [place] [address] [place], [place] [address]   Aerobic Culture (superficial specimen)     Status: None   Collection Time: 03/05/20  4:52 PM   Specimen: Abscess  Result Value Ref Range Status   Specimen Description ABSCESS  Final   Special Requests POSTERIOR CERVICAL  Final   Gram Stain NO WBC SEEN NO ORGANISMS SEEN   Final   Culture   Final    NO GROWTH 2 DAYS Performed at [facility], [place] [address] [place], [place] [address]    Report Status 03/08/2020 FINAL  Final     Labs: Basic Metabolic Panel: Recent Labs  Lab 03/07/20 0316 03/10/20 0149  NA 133* 132*  K 4.8 5.1  CL 103 102  CO2 21* 23  GLUCOSE 323* 196*  BUN 39* 44*  CREATININE 1.05 1.20  CALCIUM 8.6* 8.5*   Liver Function Tests: Recent Labs  Lab 03/07/20 0316 03/10/20 0149  AST 13*  30  ALT 16 27  ALKPHOS 53 50  BILITOT 0.7 0.4  PROT 5.7* 5.3*  ALBUMIN 2.9* 2.6*   CBC: Recent Labs  Lab 03/07/20 0316 03/10/20 0149  WBC 18.9* 19.5*  HGB 12.7* 13.0  HCT 39.2 40.2  MCV 83.2 83.9  PLT 247 258   CBG: Recent Labs  Lab 03/10/20 0612 03/10/20 1142 03/10/20 1647 03/10/20 2028 03/11/20 0751  GLUCAP 221* 120* 73 213* 192*   Hgb A1c No results for input(s): HGBA1C in the last 72 hours. Lipid Profile No results for input(s): CHOL, HDL, LDLCALC, TRIG, CHOLHDL, LDLDIRECT in the last 72 hours. Thyroid function studies No results for input(s): TSH, T4TOTAL, T3FREE, THYROIDAB in the last 72 hours.  Invalid input(s): FREET3 Urinalysis    Component Value Date/Time   COLORURINE STRAW (A) 02/01/2020 1436   APPEARANCEUR CLEAR 02/01/2020 1436   LABSPEC 1.013 02/01/2020 1436   PHURINE 5.0 02/01/2020 1436   GLUCOSEU >=500 (A) 02/01/2020 1436   HGBUR SMALL (A) 02/01/2020 1436   BILIRUBINUR NEGATIVE 02/01/2020 1436   KETONESUR NEGATIVE 02/01/2020 1436   PROTEINUR NEGATIVE 02/01/2020 1436   UROBILINOGEN 0.2 04/27/2015 1008   NITRITE NEGATIVE 02/01/2020 1436   LEUKOCYTESUR NEGATIVE 02/01/2020 1436    FURTHER DISCHARGE INSTRUCTIONS:   Get Medicines reviewed and adjusted: Please take all your medications with you for your next visit with your Primary MD  Laboratory/radiological data: Please request your Primary MD to go over all hospital tests and procedure/radiological results at the follow up, please ask your Primary MD to get all Hospital records sent to his/her office.   In some cases, they will be blood work, cultures and biopsy results pending at the time of your discharge. Please request that your primary care M.D. goes through all the records of your hospital data and follows up on these results.   Also Note the following: If you experience worsening of your admission symptoms, develop shortness of breath, life threatening emergency, suicidal or homicidal  thoughts you must seek medical attention immediately by calling 911 or calling your MD immediately  if symptoms less severe.   You must read complete instructions/literature along with all the possible adverse reactions/side effects for all the Medicines you take and that have been prescribed to you. Take any new Medicines after you have completely understood and accpet all the possible adverse reactions/side effects.    Do not drive when taking Pain medications or sleeping medications (Benzodaizepines)   Do not take more than prescribed Pain, Sleep and Anxiety Medications. It is not advisable to combine anxiety,sleep and pain medications without talking with your primary care practitioner   Special Instructions: If you have smoked or chewed Tobacco  in the last 2 yrs please stop smoking, stop any regular Alcohol  and or any Recreational drug use.   Wear Seat belts while driving.   Please note: You were cared for by a hospitalist during your hospital stay. Once you are discharged, your primary care physician will handle any further medical issues. Please note that NO REFILLS for any discharge medications will be authorized once you are discharged, as it is imperative that you return to your primary care physician (or establish a relationship with a primary care physician if you do not have one) for your post hospital discharge needs so that they can reassess your need for medications and monitor your lab values.  Time coordinating discharge: 40 minutes  SIGNED:  Marzetta Board, MD, PhD 03/11/2020, 10:13 AM

## 2020-03-11 NOTE — Progress Notes (Signed)
Physical Therapy Treatment Patient Details Name: [name] MRN: [id number] DOB: 12/15/1960 Today's Date: 03/11/2020    History of Present Illness Pt is 59 yo male who presented to ED with worsening numbness and weakness in bil UE and LE (reports chronic but worsened over past 3-4 weeks).  Pt's MRI revealed severe spinal stenosis C3-4, C4-5 with cord compression. (8/13) s/p cervical laminectomy and medial facetectomy C3-4 C4-5, and Posterior cervical arthrodesis C3-C6.    PT Comments    Patient is progressing steadily with Acute PT and was happy to work with therapy this date and excited to go to [employer] or tomorrow. Pt continues to require cues for sequencing log roll and min assist to sit up. Pt required manual blocking of Lt LE with standing and during stand step transfer to recliner. Patient complete min-squats for LE strengthening from recliner, and required repeated cues for sequencing forward trunk lean to complete. He will continue to benefit from skilled PT with follow up at CIR level. Acute will progress as able.   Follow Up Recommendations  CIR     Equipment Recommendations  3in1 (PT)    Recommendations for Other Services Rehab consult     Precautions / Restrictions Precautions Precautions: Cervical Precaution Booklet Issued: Yes (comment) Required Braces or Orthoses: Cervical Brace Cervical Brace: Soft collar (pt reports MD said doesn't have to wear; not seen in notes) Restrictions Weight Bearing Restrictions: No    Mobility  Bed Mobility Overal bed mobility: Needs Assistance Bed Mobility: Rolling;Supine to Sit Rolling: Min guard Sidelying to sit: Min assist       General bed mobility comments: Cues for reaching Rt UE to bed rail and bending Rt knee to initate roll, no physical assist needed. Min assist required with Verbal cues to press up from side to sit EOB. pt able to scoot forward to place feet on floor.  Transfers Overall transfer level: Needs  assistance Equipment used: Rolling walker (2 wheeled) Transfers: Sit to/from [facility] Sit to Stand: Mod assist;From elevated surface Stand pivot transfers: Mod assist;From elevated surface       General transfer comment: 3x Sit<>stand from EOB due to pt complaining of tightness in back with standing. Pt required mod assist to rise and blockign of Lt LE to prevent buckling. Cues for small step sequencing, pt able to lift feet rather than slide feet to step to recliner. Mod assist and cues to reach back to control decent to chair.   Ambulation/Gait        Stairs        Wheelchair Mobility    Modified Rankin (Stroke Patients Only)       Balance Overall balance assessment: Needs assistance Sitting-balance support: Feet supported;Bilateral upper extremity supported;No upper extremity supported Sitting balance-Leahy Scale: Fair Sitting balance - Comments: pt able to weight shift sitting EOB to adjust bil socks. Postural control: Left lateral lean Standing balance support: Bilateral upper extremity supported Standing balance-Leahy Scale: Poor Standing balance comment: needs external support from RW and therapist, cues trequired for upright standing position               Cognition Arousal/Alertness: Awake/alert Behavior During Therapy: WFL for tasks assessed/performed Overall Cognitive Status: No family/caregiver present to determine baseline cognitive functioning      General Comments: pt pleasant and cooperative. unsure of baseline, pt slightly slow for processing and poor carryover for tasks.       Exercises Other Exercises Other Exercises: 5x min squat from recliner  for functional LE strengthening, pt using bil UE on armrests.    General Comments        Pertinent Vitals/Pain Pain Assessment: Faces Faces Pain Scale: Hurts little more Pain Location: neck, post op Pain Descriptors / Indicators: Guarding;Aching;Discomfort Pain  Intervention(s): Limited activity within patient's tolerance;Monitored during session           PT Goals (current goals can now be found in the care plan section) Acute Rehab PT Goals Patient Stated Goal: regain strength PT Goal Formulation: With patient Time For Goal Achievement: 03/20/20 Potential to Achieve Goals: Good Progress towards PT goals: Progressing toward goals    Frequency    Min 4X/week      PT Plan Current plan remains appropriate    AM-PAC PT "6 Clicks" Mobility   Outcome Measure  Help needed turning from your back to your side while in a flat bed without using bedrails?: A Little Help needed moving from lying on your back to sitting on the side of a flat bed without using bedrails?: A Lot Help needed moving to and from a bed to a chair (including a wheelchair)?: A Lot Help needed standing up from a chair using your arms (e.g., wheelchair or bedside chair)?: A Lot Help needed to walk in hospital room?: A Lot Help needed climbing 3-5 steps with a railing? : Total 6 Click Score: 12    End of Session Equipment Utilized During Treatment: Gait belt Activity Tolerance: Patient tolerated treatment well Patient left: in chair;with call bell/phone within reach;with chair alarm set Nurse Communication: Mobility status (2 person assist) PT Visit Diagnosis: Muscle weakness (generalized) (M62.81);Other symptoms and signs involving the nervous system (R29.898);Difficulty in walking, not elsewhere classified (R26.2)     Time: 3235-5732 PT Time Calculation (min) (ACUTE ONLY): 23 min  Charges:  $Therapeutic Exercise: 8-22 mins $Therapeutic Activity: 8-22 mins                    Verner Mould, DPT Acute Rehabilitation Services  Office [phone] Pager [phone]  03/11/2020 2:32 PM

## 2020-03-11 NOTE — H&P (Signed)
Physical Medicine and Rehabilitation Admission H&P        Chief Complaint  Patient presents with  . Hypertension  . 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 Sherley Bounds with conservative care.  Per chart review patient lives alone.  1 level apartment.  Has an aide 3 days a week for 2 hours at a time used a walker and cane prior to admission.  Aide does assist with some ADLs.  Presented 02/26/2020 progressive weakness/gait instability lower extremities left greater than right as well as numbness and spasticity with stool incontinence.  Admission chemistries unremarkable except glucose 209, BUN 26, creatinine 1.46, hemoglobin 14.7, hemoglobin A1c 7.1, SARS coronavirus negative.  Recent CTs and brain MRIs demonstrated diffuse atrophy negative for acute infarct.  X-rays and imaging revealed severe spinal stenosis/myelopathy at C3-4, C4-5.  Patient underwent decompressive cervical laminectomy and medial facetectomy C3-4 4-5 with posterior cervical arthrodesis C3-C6 with lateral mass fixation 03/05/2020 per Dr. Sherley Bounds.  Cervical soft collar as directed.  Decadron protocol initiated.  Tolerating a regular consistency diet.  Therapy evaluations completed and patient was admitted for a comprehensive rehab program.     Pt notes his fall that started all his weakness occurred in 2015- he's been having a BM ~q 1week- which is normal for him; does have control of stools and using bedpan usually. Feels like needed to go- belly "growling".  Urinating OK- using condom catheter-  explained hard to use condom cathter in CIR since up doing therapy a lot of the day.  Pt said sometimes goes "too fast' - and hits 'fast" when needs to go.  Notes pain mainly in neck and meds are helpful, but don't work as quick as he would like. Eases pain.    Has muscles spasms- annoying, not painful; in arms and legs.        Review of Systems  Constitutional: Negative for chills, fever and malaise/fatigue.  HENT: Negative for hearing loss.   Eyes: Negative for blurred vision and double vision.  Respiratory: Negative for cough and shortness of breath.   Cardiovascular: Positive for leg swelling. Negative for chest pain and palpitations.  Gastrointestinal: Negative for heartburn, nausea and vomiting.       Stool incontinence  Genitourinary: Positive for urgency. Negative for dysuria, flank pain and hematuria.  Musculoskeletal: Positive for back pain, joint pain and myalgias.  Skin: Negative for rash.  Neurological: Positive for weakness.       Spasticity  All other systems reviewed and are negative.       Past Medical History:  Diagnosis Date  . Alcohol abuse      11-15-2017  per pt last alcohol Dec 2018  . BPH with obstruction/lower urinary tract symptoms    . Chronic arm pain    . Chronic pain      arms, leg, back  . CKD (chronic kidney disease), stage II    . Cocaine abuse ([place])      11-15-2017  per pt last used March 2019  . Congenital hydrocephalus, unspecified (HCC)      slow progression  . Diabetic retinopathy  of both eyes ([place])    . Diastolic CHF, chronic ([place]) 07/2017  . Gait instability      multifactorial -- slow worsening congenital hydrocephalus, peripheral neuropathy, left C5-6 cord lesion  . Hematuria    . History of acute pulmonary edema 07/2017  . History of spinal cord injury 01/02/2014    pt fell, caused spinal cord contusion at C5-6--  residual left side weakness    . History of ST elevation myocardial infarction (STEMI) 12/26/2013    related to  cocaine-induced vasospasm  . History of TIA (transient ischemic attack) and stroke      hx cva 12-20-2010 and  TIA 02-22-2011--- no residual's from cva or tia  . Hyperlipidemia    . Hypertension      followed by pcp  . 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 Lesion of bladder    . Peripheral edema      chronic LUE  . Peripheral neuropathy    . Type 2 diabetes mellitus ([place])      followed by dr Karle Plumber--  last A1c 6.8 on 11-06-2017  . Weak urinary stream           Past Surgical History:  Procedure Laterality Date  . CARDIAC CATHETERIZATION   12/01/2008   dr hochrein    abnormal stress myoview:  mild coronary plaque, normal LVF  . CARDIAC CATHETERIZATION   04/29/2011   dr [place]    in setting ECG with new ST elevation & severe hypertensive:  nonobstructive atherosclerotic CAD, normal LVF (30% mRCA)   . CARDIOVASCULAR STRESS TEST   08/15/2010    Low nuclear study w/ no evidence ishemia/  ef 42% with lateral and apical hypokinesis  . CYSTOSCOPY WITH BIOPSY N/A 11/20/2017    Procedure: CYSTOSCOPY WITH BIOPSY AND FULGURATION;  Surgeon: [name], MD;  Location: [facility];  Service: Urology;  Laterality: N/A;  . INCISION AND DRAINAGE RIGHT DISTAL MEDIAL THIGH HEMATOMA   04-14-2005   dr Ninfa Linden  . left arm skin graft   1976    injury  . LEFT HEART CATHETERIZATION WITH CORONARY ANGIOGRAM Bilateral 12/26/2013    Procedure: LEFT HEART CATHETERIZATION WITH CORONARY ANGIOGRAM;  Surgeon: Burnell Blanks, MD;  Location: [facility] CATH LAB;  Service: Cardiovascular;  Laterality: Bilateral;  STEMI, in setting cocaine/ alcohol :  mid disease in the RCA (20%), moderate disease in the small caliber intermediate branch (distal 50-60%), normal LVSF (ef 55-60%)  . TOOTH EXTRACTION N/A 05/16/2018    Procedure: DENTAL RESTORATION/EXTRACTIONS WITH ALVEO;  Surgeon: Diona Browner, DDS;  Location: [facility];  Service: Oral Surgery;   Laterality: N/A;  . TRANSTHORACIC ECHOCARDIOGRAM   08/05/2017    moderate LVH,  ef 55%,  grade 1 diastolic dysfunction/  mild LAE/  trivial Tr         Family History  Problem Relation Age of Onset  . Hypertension Mother    . Diabetes Mother    . Cancer Mother          breast cancer   . Hypertension Father    . Heart disease Father    . Stroke Father    . Hypertension Sister      Social History:  reports that he quit smoking about 2 years ago. His smoking use included cigarettes. He quit after 45.00 years of use. He has never used smokeless tobacco. He reports previous alcohol use. He reports previous drug use. Drugs: "  Crack" cocaine and Cocaine. Allergies: No Known Allergies       Medications Prior to Admission  Medication Sig Dispense Refill  . acetaminophen (TYLENOL) 500 MG tablet TAKE ONE TABLET BY MOUTH TWICE A DAY AS NEEDED (Patient taking differently: Take 500 mg by mouth 2 (two) times daily as needed. ) 60 tablet 0  . ASPIR-LOW 81 MG EC tablet TAKE 1 TABLET (81 MG TOTAL) BY MOUTH DAILY. (Patient taking differently: Take 81 mg by mouth daily. ) 30 tablet 3  . atorvastatin (LIPITOR) 40 MG tablet TAKE 1.5 TABLETS BY MOUTH DAILY (Patient taking differently: Take 40 mg by mouth daily. ) 45 tablet 2  . blood glucose meter kit and supplies KIT Dispense based on patient and insurance preference. Use once daily as directed. (E11.9) (Patient taking differently: Inject 1 each into the skin See admin instructions. Dispense based on patient and insurance preference. Use once daily as directed. (E11.9)) 1 each 0  . Blood Glucose Monitoring Suppl (ACCU-CHEK GUIDE ME) w/Device KIT 1 kit by Does not apply route 3 (three) times daily. Use as instructed to check blood sugar 3 times daily. E11.14 (Patient taking differently: 1 kit by Does not apply route See admin instructions. Use as instructed to check blood sugar 3 times daily. E11.14) 1 kit 0  . dapagliflozin propanediol (FARXIGA) 10 MG TABS tablet  Take 1 tablet (10 mg total) by mouth daily before breakfast. 30 tablet 6  . DULoxetine (CYMBALTA) 60 MG capsule TAKE ONE CAPSULE BY MOUTH DAILY FOR LEG AND BACK PAINS (Patient taking differently: Take 60 mg by mouth daily. ) 30 capsule 1  . furosemide (LASIX) 40 MG tablet TAKE ONE TABLET BY MOUTH DAILY . TAKE EXTRA TABLET FOR TWO DAYS AS NEEDED FOR INCREASE LOWER EXTREMITY SWELLING (Patient taking differently: Take 40 mg by mouth See admin instructions. TAKE ONE TABLET BY MOUTH DAILY . TAKE EXTRA TABLET FOR TWO DAYS AS NEEDED FOR INCREASE LOWER EXTREMITY SWELLING) 40 tablet 2  . gabapentin (NEURONTIN) 300 MG capsule Take 1 capsule (300 mg total) by mouth 2 (two) times daily. 60 capsule 6  . glipiZIDE (GLUCOTROL XL) 10 MG 24 hr tablet Take 1 tablet (10 mg total) by mouth daily with breakfast. 30 tablet 6  . glucose blood (ACCU-CHEK GUIDE) test strip Use as instructed to check blood sugar 3 times daily. E11.14 (Patient taking differently: 1 each by Other route See admin instructions. Use as instructed to check blood sugar 3 times daily. E11.14) 100 each 11  . hydrALAZINE (APRESOLINE) 10 MG tablet TAKE ONE TABLET BY MOUTH TWICE A DAY (Patient taking differently: Take 10 mg by mouth in the morning and at bedtime. ) 60 tablet 2  . labetalol (NORMODYNE) 100 MG tablet TAKE ONE TABLET BY MOUTH TWICE A DAY (Patient taking differently: Take 100 mg by mouth 2 (two) times daily. ) 60 tablet 2  . Lancets Misc. (ACCU-CHEK SOFTCLIX LANCET DEV) KIT Use as directed (Patient taking differently: 1 each by Other route as directed. ) 1 kit 0  . Lancets MISC Use as instructed to check blood sugar 3 times daily. E11.14 (Patient taking differently: 1 each by Other route See admin instructions. Use as instructed to check blood sugar 3 times daily. E11.14) 100 each 11  . lisinopril (ZESTRIL) 40 MG tablet Take 1 tablet (40 mg total) by mouth daily. 30 tablet 6  . loratadine (CLARITIN) 10 MG tablet TAKE ONE TABLET BY MOUTH DAILY AS  NEEDED FOR ALLERGIES (Patient taking differently: Take  10 mg by mouth daily as needed for allergies. ) 30 tablet 5  . metFORMIN (GLUCOPHAGE) 1000 MG tablet TAKE ONE TABLET BY MOUTH TWICE A DAY WITH A MEAL. (Patient taking differently: Take 1,000 mg by mouth 2 (two) times daily with a meal. ) 180 tablet 1  . vitamin B-12 (CYANOCOBALAMIN) 500 MCG tablet Take 1 tablet (500 mcg total) by mouth daily. 100 tablet 1      Drug Regimen Review Drug regimen was reviewed and remains appropriate with no significant issues identified   Home: Home Living Family/patient expects to be discharged to:: Private residence Living Arrangements: Alone Available Help at Discharge: Personal care attendant Type of Home: Apartment Home Access: Level entry Home Layout: One level Bathroom Shower/Tub: Chiropodist: Standard Bathroom Accessibility: Yes Home Equipment: Cane - single point, Walker - 2 wheels, Bedside commode Additional Comments: pt reported aide comes 7 days/week for 2.25 hours  Lives With: Alone   Functional History: Prior Function Level of Independence: Needs assistance Gait / Transfers Assistance Needed: Walker and cane for gait. Walker when out of home. ADL's / Homemaking Assistance Needed: reports aides assist with IADLs, showers, and dressing; reports does tolieting but sometimes not able to get to bathroom in time Comments: Has aide 7 days a week for about 2 hours in am; Pt reports weakness/numbness in extremities (L worse than right) chronically but has worsened over the past few weeks   Functional Status:  Mobility: Bed Mobility Overal bed mobility: Needs Assistance Bed Mobility: Rolling, Sidelying to Sit Rolling: Min assist Sidelying to sit: Mod assist Supine to sit: Min assist, Mod assist Sit to supine: Mod assist General bed mobility comments: Educated on log roll technique, pillow to maintain neutral. Min assist rolling towards left with hand on rail for support.  Mod assist for truncal support boosting to seated position. Transfers Overall transfer level: Needs assistance Equipment used: Rolling walker (2 wheeled) Transfer via Lift Equipment: Marketing executive Transfers: Sit to/from Stand, Risk manager Sit to Stand: Min assist, From elevated surface Stand pivot transfers: Min assist General transfer comment: Performed with mod assist initially progressed to min assist with bed elevated. Educated on technique. Able to keep hands on RW, pushing primarily with LEs. Pivot to recliner with min assist for RW navigation. Ambulation/Gait Ambulation/Gait assistance: Min assist, +2 safety/equipment, Mod assist Gait Distance (Feet): 25 Feet Assistive device:  ([name]) Gait Pattern/deviations: Step-to pattern, Decreased stride length, Shuffle, Ataxic, Narrow base of support, Drifts right/left General Gait Details: Pt was able to progress steps in [place] plus walking to door and peeked into hallway.  Left the knee pad in place to prevent buckling.  Pt took small steps but almost had to due to the [place] plus set up.  Gait velocity: decreased Gait velocity interpretation: <1.31 ft/sec, indicative of household ambulator   ADL: ADL Overall ADL's : Needs assistance/impaired Eating/Feeding: Set up, Sitting Eating/Feeding Details (indicate cue type and reason): requires setup to open containers end of session  Grooming: Wash/dry face, Min guard, Sitting Grooming Details (indicate cue type and reason): pt requiring use of bil hands to perform task given UE weakness  Upper Body Bathing: Sitting, Minimal assistance Lower Body Bathing: Maximal assistance, Sitting/lateral leans, Sit to/from stand Upper Body Dressing : Minimal assistance, Sitting Lower Body Dressing: Maximal assistance, Sit to/from stand, Sitting/lateral leans Toilet Transfer: Minimal assistance, +2 for safety/equipment, BSC, Stand-pivot, Cueing for safety Toilet Transfer Details (indicate cue type and  reason): to recliner, +2 for safety due  to LE buckling, decreased eccentric control Toileting- Clothing Manipulation and Hygiene: Maximal assistance, Sit to/from stand Toileting - Clothing Manipulation Details (indicate cue type and reason): pt attempting to use urinal at EOB, supervision for balance  Functional mobility during ADLs: Minimal assistance, +2 for safety/equipment, Rolling walker, Cueing for safety General ADL Comments: pt tolerating EOB activity today including seated ADL and UB/LB AROM   Cognition: Cognition Overall Cognitive Status: Impaired/Different from baseline Orientation Level: Oriented to person, Oriented to place, Oriented to time, Oriented to situation Cognition Arousal/Alertness: Awake/alert Behavior During Therapy: WFL for tasks assessed/performed Overall Cognitive Status: Impaired/Different from baseline Area of Impairment: Memory Memory: Decreased recall of precautions, Decreased short-term memory General Comments: Calling nurses station, forgetting why.   Physical Exam: Blood pressure (!) 148/55, pulse 65, temperature (!) 97.5 F (36.4 C), temperature source Oral, resp. rate 20, height '6\' 3"'  (1.905 m), weight 109.2 kg, SpO2 100 %. Physical Exam Vitals and nursing note reviewed.  Constitutional:      Appearance: He is obese.     Comments: Pt sitting up in bed- has lunch delivered; set him up after exam; appropriate, NAD  HENT:     Head: Normocephalic and atraumatic.     Comments: Smile equal; tongue midline    Right Ear: External ear normal.     Left Ear: External ear normal.     Nose: Nose normal. No congestion.     Mouth/Throat:     Mouth: Mucous membranes are dry.     Pharynx: Oropharynx is clear. No oropharyngeal exudate.  Eyes:     General:        Right eye: No discharge.        Left eye: No discharge.     Extraocular Movements: Extraocular movements intact.  Neck:     Comments: Soft cervical collar per PA note Wasn't wearing  One When I was  there Cervical spine- tight scalenes and posterior cervical incision- staples intact- looks great- no erythema Cardiovascular:     Comments: RRR- no M/R/G Pulmonary:     Comments: CTA B/L- no W/R/R- good air movement B/L Abdominal:     Comments: Soft, protuberant, NT, slightly distended?; hyperactive  Genitourinary:    Comments: Condom catheter in place- medium to dark amber urine in container Musculoskeletal:     Comments: UEs- biceps 5-/5, WE 4+/5, triceps 4-/5, grip 4-/5, finger abd 3+/5 B/L LEs- R HF 4+/5; L HF 4/5- otherwise 4+/5 in KE, DF, PF and EHL B/L  Skin:    Comments: No heel bogginess B/L- posterior incision of neck looks great as above  Neurological:     Comments: Patient is alert no acute distress.  Oriented x3 and follows commands.  Fair awareness of deficits.  Decreased sensation to light touch in L1 to S2 B/L- otherwise intact in UEs and torso B/L No clonus B/L Hoffman's (+) B/L MAS of 1 in LUE and LEs- MAS of 0 in RUE  Psychiatric:     Comments: Appropriate, but flat        Lab Results Last 48 Hours        Results for orders placed or performed during the hospital encounter of 02/25/20 (from the past 48 hour(s))  Glucose, capillary     Status: Abnormal    Collection Time: 03/05/20  2:34 PM  Result Value Ref Range    Glucose-Capillary 120 (H) 70 - 99 mg/dL      Comment: Glucose reference range applies only to samples taken after fasting for  at least 8 hours.  Type and screen [place]     Status: None    Collection Time: 03/05/20  3:18 PM  Result Value Ref Range    ABO/RH(D) AB POS      Antibody Screen NEG      Sample Expiration          03/08/2020,2359 Performed at [facility], [place] [address], [place], [place] [address]    Aerobic Culture  (superficial specimen)     Status: None (Preliminary result)    Collection Time: 03/05/20  4:52 PM    Specimen: Abscess  Result Value Ref Range    Specimen Description ABSCESS       Special Requests POSTERIOR CERVICAL      Gram Stain NO WBC SEEN NO ORGANISMS SEEN        Culture          NO GROWTH 2 DAYS Performed at [facility], [place] [address], [place], [place] [address]      Report Status PENDING    Glucose, capillary     Status: Abnormal    Collection Time: 03/05/20  6:35 PM  Result Value Ref Range    Glucose-Capillary 120 (H) 70 - 99 mg/dL      Comment: Glucose reference range applies only to samples taken after fasting for at least 8 hours.  Glucose, capillary     Status: Abnormal    Collection Time: 03/05/20 10:09 PM  Result Value Ref Range    Glucose-Capillary 179 (H) 70 - 99 mg/dL      Comment: Glucose reference range applies only to samples taken after fasting for at least 8 hours.    Comment 1 Notify RN      Comment 2 Document in Chart    Glucose, capillary     Status: Abnormal    Collection Time: 03/06/20  6:05 AM  Result Value Ref Range    Glucose-Capillary 304 (H) 70 - 99 mg/dL      Comment: Glucose reference range applies only to samples taken after fasting for at least 8 hours.  Glucose, capillary     Status: Abnormal    Collection Time: 03/06/20 12:05 PM  Result Value Ref Range    Glucose-Capillary 154 (H) 70 - 99 mg/dL      Comment: Glucose reference range applies only to samples taken after fasting for at least 8 hours.  Glucose, capillary     Status: Abnormal    Collection Time: 03/06/20  4:17 PM  Result Value Ref Range    Glucose-Capillary 141 (H) 70 - 99 mg/dL      Comment: Glucose reference range applies only to samples taken after fasting for at least 8 hours.  Glucose, capillary     Status: Abnormal    Collection Time: 03/06/20  9:07 PM  Result Value Ref Range    Glucose-Capillary 267 (H) 70 - 99 mg/dL      Comment: Glucose reference range applies only to samples taken after fasting for at least 8 hours.    Comment 1 Notify RN      Comment 2 Document in Chart    Comprehensive metabolic panel     Status: Abnormal     Collection Time: 03/07/20  3:16 AM  Result Value Ref Range    Sodium 133 (L) 135 - 145 mmol/L    Potassium 4.8 3.5 - 5.1 mmol/L    Chloride 103 98 - 111 mmol/L  CO2 21 (L) 22 - 32 mmol/L    Glucose, Bld 323 (H) 70 - 99 mg/dL      Comment: Glucose reference range applies only to samples taken after fasting for at least 8 hours.    BUN 39 (H) 8 - 23 mg/dL    Creatinine, Ser 1.05 0.61 - 1.24 mg/dL    Calcium 8.6 (L) 8.9 - 10.3 mg/dL    Total Protein 5.7 (L) 6.5 - 8.1 g/dL    Albumin 2.9 (L) 3.5 - 5.0 g/dL    AST 13 (L) 15 - 41 U/L    ALT 16 0 - 44 U/L    Alkaline Phosphatase 53 38 - 126 U/L    Total Bilirubin 0.7 0.3 - 1.2 mg/dL    GFR calc non Af Amer >60 >60 mL/min    GFR calc Af Amer >60 >60 mL/min    Anion gap 9 5 - 15      Comment: Performed at [facility] [address] [place], [place] [address]  CBC     Status: Abnormal    Collection Time: 03/07/20  3:16 AM  Result Value Ref Range    WBC 18.9 (H) 4.0 - 10.5 K/uL    RBC 4.71 4.22 - 5.81 MIL/uL    Hemoglobin 12.7 (L) 13.0 - 17.0 g/dL    HCT 39.2 39 - 52 %    MCV 83.2 80.0 - 100.0 fL    MCH 27.0 26.0 - 34.0 pg    MCHC 32.4 30.0 - 36.0 g/dL    RDW 13.8 11.5 - 15.5 %    Platelets 247 150 - 400 K/uL    nRBC 0.0 0.0 - 0.2 %      Comment: Performed at [facility], [place] [address], [place], [place] [address]  ABO/Rh     Status: None    Collection Time: 03/07/20  3:16 AM  Result Value Ref Range    ABO/RH(D)          AB POS Performed at [facility] [address] [place], [place] [address]    Glucose, capillary     Status: Abnormal    Collection Time: 03/07/20  6:08 AM  Result Value Ref Range    Glucose-Capillary 308 (H) 70 - 99 mg/dL      Comment: Glucose reference range applies only to samples taken after fasting for at least 8 hours.    Comment 1 Notify RN      Comment 2 Document in Chart    Glucose, capillary     Status: Abnormal    Collection Time: 03/07/20 11:49 AM  Result Value Ref Range      Glucose-Capillary 282 (H) 70 - 99 mg/dL      Comment: Glucose reference range applies only to samples taken after fasting for at least 8 hours.    Comment 1 Notify RN      Comment 2 Document in Chart         Imaging Results (Last 48 hours)  DG Cervical Spine 2-3 Views   Result Date: 03/05/2020 CLINICAL DATA:  Surgery, elective. C3-6 posterior fusion. Fluoroscopy time 12 seconds, 3.45 mGy EXAM: CERVICAL SPINE - 2-3 VIEW; DG C-ARM 1-60 MIN COMPARISON:  MRI of the cervical spine 02/26/2020 FINDINGS: Three intraoperative fluoroscopic images of the cervical spine are submitted. A posterior spinal fusion construct is now present at the C3-C6 levels. Superimposition of the shoulders limits evaluation of the C4 and more caudal levels  on the lateral view images. Additionally, overlying artifact partially obscures the lower aspect of the right-sided fusion hardware on the PA image. Within these limitations, there is no unexpected finding. Partially visualized support tubes. IMPRESSION: Three intraoperative fluoroscopic images of the cervical spine as above. Electronically Signed   By: Kellie Simmering DO   On: 03/05/2020 18:13    DG C-Arm 1-60 Min   Result Date: 03/05/2020 CLINICAL DATA:  Surgery, elective. C3-6 posterior fusion. Fluoroscopy time 12 seconds, 3.45 mGy EXAM: CERVICAL SPINE - 2-3 VIEW; DG C-ARM 1-60 MIN COMPARISON:  MRI of the cervical spine 02/26/2020 FINDINGS: Three intraoperative fluoroscopic images of the cervical spine are submitted. A posterior spinal fusion construct is now present at the C3-C6 levels. Superimposition of the shoulders limits evaluation of the C4 and more caudal levels on the lateral view images. Additionally, overlying artifact partially obscures the lower aspect of the right-sided fusion hardware on the PA image. Within these limitations, there is no unexpected finding. Partially visualized support tubes. IMPRESSION: Three intraoperative fluoroscopic images of the cervical  spine as above. Electronically Signed   By: Kellie Simmering DO   On: 03/05/2020 18:13             Medical Problem List and Plan: 1.  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  Cervical collar at all times             -patient may  Shower with covering incision             -ELOS/Goals: min A to Mod I, 3 weeks 2.  Antithrombotics: -DVT/anticoagulation: SCDs.  Check vascular study -will check if NSU will allow pt to have Lovenox due to increased risk of DVT/PE as SCI pt- has been 6 days since surgery on 8/13             -antiplatelet therapy: N/A 3. Pain Management: Neurontin 300 mg twice daily, baclofen 5 mg 3 times daily, Cymbalta 60 mg daily, oxycodone as needed  4. Mood: Provide emotional support             -antipsychotic agents: N/A 5. Neuropsych: This patient is capable of making decisions on his own behalf. 6. Skin/Wound Care: Routine skin checks 7. Fluids/Electrolytes/Nutrition: Routine in and outs with follow-up chemistries 8.  Hypertension.  Hydralazine 25 mg twice daily, labetalol 100 mg twice daily, lisinopril 40 mg daily.  Monitor with increased mobility 9.  Diabetes mellitus with peripheral neuropathy.  Hemoglobin A1c 7.1.  NovoLog 12 units 3 times daily with meals, Lantus insulin 55 units daily.  Check blood sugars before meals and at bedtime.  Diabetic teaching 10.  CKD stage II.  Creatinine baseline 1.46.  Follow-up chemistries 11.  Morbid obesity.  BMI 30.09.  Dietary follow-up 12.  History of alcohol tobacco cocaine use.  Providing counseling 13.  Diastolic congestive heart failure.  Lasix 40 mg daily.  Monitor with increased mobility 14.  Hyperlipidemia.  Lipitor 15. Neurogenic bowel and bladder- goes every 1 week - which is likely the slowing of neurogenic bowel - need to improve speed; also has urinary urgency, which is also neurogenic- will  monitor and treat as required.  16. Spasticity- on Baclofen 38m TID- adequately controlled- mainly annoying, not painful- will monitor muscle spasms.      DLavon PaganiniAngiulli, PA-C 03/07/2020      I have personally performed a face to face diagnostic evaluation of  this patient and formulated the key components of the plan.  Additionally, I have personally reviewed laboratory data, imaging studies, as well as relevant notes and concur with the physician assistant's documentation above.   The patient's status has not changed from the original H&P.  Any changes in documentation from the acute care chart have been noted above.

## 2020-03-12 ENCOUNTER — Inpatient Hospital Stay (HOSPITAL_COMMUNITY): Payer: Medicare Other | Admitting: Physical Therapy

## 2020-03-12 ENCOUNTER — Inpatient Hospital Stay (HOSPITAL_COMMUNITY): Payer: Medicare Other | Admitting: Occupational Therapy

## 2020-03-12 ENCOUNTER — Encounter (HOSPITAL_COMMUNITY): Payer: Self-pay | Admitting: Physical Medicine and Rehabilitation

## 2020-03-12 ENCOUNTER — Other Ambulatory Visit: Payer: Self-pay

## 2020-03-12 ENCOUNTER — Ambulatory Visit (HOSPITAL_COMMUNITY): Payer: Medicare Other

## 2020-03-12 DIAGNOSIS — G959 Disease of spinal cord, unspecified: Secondary | ICD-10-CM

## 2020-03-12 LAB — CBC WITH DIFFERENTIAL/PLATELET
Abs Immature Granulocytes: 0.37 10*3/uL — ABNORMAL HIGH (ref 0.00–0.07)
Basophils Absolute: 0 10*3/uL (ref 0.0–0.1)
Basophils Relative: 0 %
Eosinophils Absolute: 0 10*3/uL (ref 0.0–0.5)
Eosinophils Relative: 0 %
HCT: 38.4 % — ABNORMAL LOW (ref 39.0–52.0)
Hemoglobin: 12.4 g/dL — ABNORMAL LOW (ref 13.0–17.0)
Immature Granulocytes: 2 %
Lymphocytes Relative: 7 %
Lymphs Abs: 1.4 10*3/uL (ref 0.7–4.0)
MCH: 27.3 pg (ref 26.0–34.0)
MCHC: 32.3 g/dL (ref 30.0–36.0)
MCV: 84.4 fL (ref 80.0–100.0)
Monocytes Absolute: 1.1 10*3/uL — ABNORMAL HIGH (ref 0.1–1.0)
Monocytes Relative: 6 %
Neutro Abs: 17.2 10*3/uL — ABNORMAL HIGH (ref 1.7–7.7)
Neutrophils Relative %: 85 %
Platelets: 234 10*3/uL (ref 150–400)
RBC: 4.55 MIL/uL (ref 4.22–5.81)
RDW: 14 % (ref 11.5–15.5)
WBC: 20.1 10*3/uL — ABNORMAL HIGH (ref 4.0–10.5)
nRBC: 0 % (ref 0.0–0.2)

## 2020-03-12 LAB — GLUCOSE, CAPILLARY
Glucose-Capillary: 345 mg/dL — ABNORMAL HIGH (ref 70–99)
Glucose-Capillary: 110 mg/dL — ABNORMAL HIGH (ref 70–99)
Glucose-Capillary: 67 mg/dL — ABNORMAL LOW (ref 70–99)
Glucose-Capillary: 79 mg/dL (ref 70–99)
Glucose-Capillary: 31 mg/dL — CL (ref 70–99)
Glucose-Capillary: 74 mg/dL (ref 70–99)

## 2020-03-12 LAB — URINALYSIS, ROUTINE W REFLEX MICROSCOPIC
Bilirubin Urine: NEGATIVE
Glucose, UA: NEGATIVE mg/dL
Ketones, ur: NEGATIVE mg/dL
Nitrite: POSITIVE — AB
Protein, ur: NEGATIVE mg/dL
Specific Gravity, Urine: 1.011 (ref 1.005–1.030)
pH: 5 (ref 5.0–8.0)

## 2020-03-12 LAB — COMPREHENSIVE METABOLIC PANEL
ALT: 26 U/L (ref 0–44)
AST: 16 U/L (ref 15–41)
Albumin: 2.6 g/dL — ABNORMAL LOW (ref 3.5–5.0)
Alkaline Phosphatase: 50 U/L (ref 38–126)
Anion gap: 9 (ref 5–15)
BUN: 60 mg/dL — ABNORMAL HIGH (ref 8–23)
CO2: 25 mmol/L (ref 22–32)
Calcium: 9 mg/dL (ref 8.9–10.3)
Chloride: 99 mmol/L (ref 98–111)
Creatinine, Ser: 1.3 mg/dL — ABNORMAL HIGH (ref 0.61–1.24)
GFR calc Af Amer: >60 mL/min (ref >60)
GFR calc non Af Amer: 59 mL/min — ABNORMAL LOW (ref >60)
Glucose, Bld: 323 mg/dL — ABNORMAL HIGH (ref 70–99)
Potassium: 5.1 mmol/L (ref 3.5–5.1)
Sodium: 133 mmol/L — ABNORMAL LOW (ref 135–145)
Total Bilirubin: 0.5 mg/dL (ref 0.3–1.2)
Total Protein: 5.7 g/dL — ABNORMAL LOW (ref 6.5–8.1)

## 2020-03-12 MED ORDER — GLUCOSE 40 % PO GEL
ORAL | Status: AC
  Filled 2020-03-12: qty 1

## 2020-03-12 MED ORDER — SORBITOL 70 % SOLN
30.0000 mL | Freq: Once | Status: AC
  Administered 2020-03-12: 30 mL via ORAL
  Filled 2020-03-12: qty 30

## 2020-03-12 MED ORDER — ENOXAPARIN SODIUM 40 MG/0.4ML ~~LOC~~ SOLN
40.0000 mg | SUBCUTANEOUS | Status: DC
  Administered 2020-03-12 – 2020-03-25 (×14): 40 mg via SUBCUTANEOUS
  Filled 2020-03-12 (×15): qty 0.4

## 2020-03-12 NOTE — IPOC Note (Signed)
Overall Plan of Care [facility]) Patient Details Name: [name] MRN: [id number] DOB: [birth year]  Admitting Diagnosis: Cervical myelopathy [facility])  Hospital Problems: Principal Problem:   Cervical myelopathy ([place]) Active Problems:   Hyperlipidemia associated with type 2 diabetes mellitus ([place])     Functional Problem List: Nursing Bladder, Pain, Safety, Skin Integrity  PT Balance, Endurance, Motor, Sensory, Safety, Perception, Pain, Skin Integrity  OT Balance, Cognition, Endurance, Motor, Pain, Safety, Skin Integrity  SLP    TR         Basic ADL's: OT Grooming, Bathing, Dressing, Toileting     Advanced  ADL's: OT       Transfers: PT Bed Mobility, Bed to Chair  OT Toilet, Tub/Shower     Locomotion: PT Ambulation, Wheelchair Mobility     Additional Impairments: OT Fuctional Use of Upper Extremity  SLP        TR      Anticipated Outcomes Item Anticipated Outcome  Self Feeding Independent  Swallowing      Basic self-care  Min A  Toileting  Supervision A   Bathroom Transfers Supervision A  Bowel/Bladder  Continent of bowel with minimal assistance. Maintain bladder emptying and keep clean and dry PRN  Transfers  CGA  Locomotion  min A with LRAD  Communication     Cognition     Pain  Pain level = to or < 3.  Safety/Judgment  No falls during admission.   Therapy Plan: PT Intensity: Minimum of 1-2 x/day ,45 to 90 minutes PT Frequency: 5 out of 7 days PT Duration Estimated Length of Stay: 3-4 weeks OT Intensity: Minimum of 1-2 x/day, 45 to 90 minutes OT Frequency: 5 out of 7 days OT Duration/Estimated Length of Stay: 21-24 days     Due to the current state of emergency, patients may not be receiving their 3-hours of Medicare-mandated therapy.   Team Interventions: Nursing Interventions Patient/Family Education, Pain Management, Bladder Management, Skin Care/Wound Management, Disease Management/Prevention, Bowel Management  PT interventions  Ambulation/gait training, Discharge planning, DME/adaptive equipment instruction, Functional mobility training, Pain management, Psychosocial support, Splinting/orthotics, Therapeutic Activities, UE/LE Strength taining/ROM, Visual/perceptual remediation/compensation, Wheelchair propulsion/positioning, UE/LE Coordination activities, Therapeutic Exercise, Stair training, Skin care/wound management, Patient/family education, Neuromuscular re-education, Community reintegration, Training and development officer, Disease management/prevention  OT Interventions Training and development officer, Cognitive remediation/compensation, Academic librarian, Discharge planning, DME/adaptive equipment instruction, Functional mobility training, Neuromuscular re-education, Pain management, Patient/family education, Self Care/advanced ADL retraining, Skin care/wound managment, Therapeutic Activities, Therapeutic Exercise, UE/LE Strength taining/ROM, UE/LE Coordination activities, Wheelchair propulsion/positioning  SLP Interventions    TR Interventions    SW/CM Interventions Discharge Planning, Psychosocial Support, Patient/Family Education   Barriers to Discharge MD  Medical stability, Home enviroment access/loayout, Wound care, Lack of/limited family support, Weight and Weight bearing restrictions  Nursing Decreased caregiver support, Incontinence, Lack of/limited family support    PT Decreased caregiver support, Home environment access/layout, Incontinence, Lack of/limited family support WC unable to mobilize into bathroom  OT      SLP      SW       Team Discharge Planning: Destination: PT-Home ,OT- Home , SLP-  Projected Follow-up: PT-Home health PT, 24 hour supervision/assistance, OT-  24 hour supervision/assistance, Home health OT, SLP-  Projected Equipment Needs: PT-To be determined, OT- To be determined, SLP-  Equipment Details: PT-pt reports he has BSC, shower seat, rollator, cane, OT-  Patient/family  involved in discharge planning: PT- Patient,  OT-Patient, SLP-   MD ELOS: 3-4 weeks Medical Rehab Prognosis:  Good Assessment: Pt  is a 59 yr old male with 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       See Team Conference Notes for weekly updates to the plan of care

## 2020-03-12 NOTE — Progress Notes (Signed)
Patient Details  Name: [name] MRN: [id number] Date of Birth: May 12, 1961  Today's Date: 03/12/2020  Hospital Problems: Principal Problem:   Cervical myelopathy [facility]) Active Problems:   Hyperlipidemia associated with type 2 diabetes mellitus ([place])  Past Medical History:  Past Medical History:  Diagnosis Date  . Alcohol abuse    11-15-2017  per pt last alcohol Dec 2018  . BPH with obstruction/lower urinary tract symptoms   . Chronic arm pain   . Chronic pain    arms, leg, back  . CKD (chronic kidney disease), stage II   . Cocaine abuse ([place])    11-15-2017  per pt last used March 2019  . Congenital hydrocephalus, unspecified (HCC)    slow progression  . Diabetic retinopathy of both eyes ([place])   . Diastolic CHF, chronic ([place]) 07/2017  . Gait instability    multifactorial -- slow worsening congenital hydrocephalus, peripheral neuropathy, left C5-6 cord lesion  . Hematuria   . History of acute pulmonary edema 07/2017  . History of spinal cord injury 01/02/2014   pt fell, caused spinal cord contusion at C5-6--  residual left side weakness    . 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 History of TIA (transient ischemic attack) and stroke    hx cva 12-20-2010 and  TIA 02-22-2011--- no residual's from cva or tia  . Hyperlipidemia   . Hypertension    followed by pcp  . 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 Lesion of bladder   . Peripheral edema    chronic LUE  . Peripheral neuropathy   . Type 2 diabetes mellitus ([place])    followed by dr Karle Plumber--  last A1c 6.8 on 11-06-2017  . Weak urinary stream    Past Surgical History:  Past Surgical History:  Procedure Laterality Date  . CARDIAC CATHETERIZATION  12/01/2008   dr hochrein   abnormal stress myoview:  mild coronary plaque, normal LVF  . CARDIAC CATHETERIZATION  04/29/2011   dr [place]ique   in setting ECG with new  ST elevation & severe hypertensive:  nonobstructive atherosclerotic CAD, normal LVF (30% mRCA)   . CARDIOVASCULAR STRESS TEST  08/15/2010   Low nuclear study w/ no evidence ishemia/  ef 42% with lateral and apical hypokinesis  . CYSTOSCOPY WITH BIOPSY N/A 11/20/2017   Procedure: CYSTOSCOPY WITH BIOPSY AND FULGURATION;  Surgeon: [name], MD;  Location: [facility];  Service: Urology;  Laterality: N/A;  . INCISION AND DRAINAGE RIGHT DISTAL MEDIAL THIGH HEMATOMA  04-14-2005   dr Ninfa Linden  . left arm skin graft  1976   injury  . LEFT HEART CATHETERIZATION WITH CORONARY ANGIOGRAM Bilateral 12/26/2013   Procedure: LEFT HEART CATHETERIZATION WITH CORONARY ANGIOGRAM;  Surgeon: Burnell Blanks, MD;  Location: [facility] CATH LAB;  Service: Cardiovascular;  Laterality: Bilateral;  STEMI, in setting cocaine/ alcohol :  mid disease in the RCA (20%), moderate disease in the small caliber intermediate branch (distal 50-60%), normal LVSF (ef 55-60%)  . POSTERIOR CERVICAL FUSION/FORAMINOTOMY N/A 03/05/2020   Procedure: CERVICAL THREE-FOUR, CERVICAL FOUR-FIVE, CERVICAL FIVE-SIX POSTERIOR CERVICAL FUSION/FORAMINOTOMY;  Surgeon: Eustace Moore, MD;  Location: [place];  Service: Neurosurgery;  Laterality: N/A;  . TOOTH EXTRACTION N/A 05/16/2018   Procedure: DENTAL RESTORATION/EXTRACTIONS WITH ALVEO;  Surgeon: Diona Browner, DDS;  Location: [place];  Service: Oral Surgery;  Laterality: N/A;  . TRANSTHORACIC ECHOCARDIOGRAM  08/05/2017  moderate LVH,  ef 55%,  grade 1 diastolic dysfunction/  mild LAE/  trivial Tr   Social History:  reports that he quit smoking about 2 years ago. His smoking use included cigarettes. He quit after 45.00 years of use. He has never used smokeless tobacco. He reports previous alcohol use. He reports previous drug use. Drugs: "Crack" cocaine and Cocaine.  Family / Support Systems Marital Status: Divorced How Long?: Didn't provide answer Spouse/Significant  Other: n/a Children: None Other Supports: Has aide 7 days a week for about 2 hours in AM. Anticipated Caregiver: Personal Care Attendent Ability/Limitations of Caregiver: Not available 24/7 Caregiver Availability: Intermittent  Social History Preferred language: English Religion: Christian Cultural Background: Reports that he quit smoking about 2 years ago. His smoking use included cigarettes. He quit after 45.00 years of use. He has never used smokeless tobacco. He reports previous alcohol use. He reports previous drug use. Drugs: "Crack" cocaine and Cocaine. Education: 10th grade Read: Yes Write: Yes Employment Status: Retired Date Retired/Disabled/Unemployed: Unknown Public relations account executive Issues: None Guardian/Conservator: n/a   Abuse/Neglect Abuse/Neglect Assessment Can Be Completed: Yes Physical Abuse: Denies Verbal Abuse: Denies Sexual Abuse: Denies Exploitation of patient/patient's resources: Denies Self-Neglect: Denies  Emotional Status Pt's affect, behavior and adjustment status: Appropriate, pleasant, but flat Recent Psychosocial Issues: n/a Psychiatric History: n/a Substance Abuse History: Cocaine  Patient / Family Perceptions, Expectations & Goals Pt/Family understanding of illness & functional limitations: Severe spinal stenosis/myelopathy at C3-4, C4-5. Underwent decompressive cervical laminectomy and medial facetectomy C3-4 4-5 with posterior cervical arthrodesis C3-C6 with lateral mass fixation 03/05/2020. Premorbid pt/family roles/activities: Has an aide 3 days a week for 2 hours at a time used a walker and cane prior to admission.  Aide does assist with some ADLs. Anticipated changes in roles/activities/participation: None Pt/family expectations/goals: To get strong enough to go home.  Community [employer] Agencies: None Premorbid Home Care/DME Agencies: None Transportation available at discharge: Unknown at this time. Resource referrals  recommended: Neuropsychology  Discharge Planning Living Arrangements: Alone Support Systems: Other relatives, Friends/neighbors Type of Residence: Private residence Insurance Resources: [employer] Financial Resources: Radio broadcast assistant Screen Referred: No Living Expenses: Rent Money Management: Patient Does the patient have any problems obtaining your medications?: No (Medications delivered via USPS) Home Management: Patient Patient/Family Preliminary Plans: Get strong enough to go home Care Coordinator Barriers to Discharge: Lack of/limited family support, Decreased caregiver support, Neurogenic Bowel & Bladder, Incontinence, Inaccessible home environment, Home environment access/layout Care Coordinator Anticipated Follow Up Needs: HH/OP Expected length of stay: 20-22 days  Clinical Impression Met with patient, introduced myself, explained role, and discussed discharge plans. Patient was pleasant but was in pain at the time of our meeting. He did state that he had already had pain medication but today's therapy was really hard. He stated he was ready to do the work needed to get stronger to go home.  Cristi Loron 03/12/2020, 3:34 PM

## 2020-03-12 NOTE — Progress Notes (Signed)
Called Dr. Naaman Plummer r/t meal time insulin. F/s- 67 at 1635. 4 oz of juice given and f/s- 79 at 1710. Instructed to give 8 units at this time.

## 2020-03-12 NOTE — Progress Notes (Addendum)
Physical Therapy Session Note  Patient Details  Name: [name] MRN: [id number] Date of Birth: 1960/10/29  Today's Date: 03/12/2020 PT Individual Time: 1405-1440 PT Individual Time Calculation (min): 35 min missed time:40 mins  Short Term Goals: Week 1:  PT Short Term Goal 1 (Week 1): pt to demonstate min A x1 bed mobility PT Short Term Goal 2 (Week 1): pt to demonstrate min A x1 functional transfers with LRAD PT Short Term Goal 3 (Week 1): pt to demonstrate 47' with LRAD mod A x1 PT Short Term Goal 4 (Week 1): pt to demonstrate dynamic sitting balance for functional tasks at min A PT Short Term Goal 4 - Progress (Week 1): Not met  Skilled Therapeutic Interventions/Progress Updates:    pt received in bed and reported he felt "sick at his stomach", agreeable to sitting EOB for therapy. Pt directed in supine>sitting EOB with log rolling to pt's L, max A to come to sitting x3 attempts to achieve sitting positioning. Pt directed in sitting EOB statically for 10 mins with mod A for corrections to midline as pt demonstrated R side lean and very forward carriage, and overall trunk flexion, minimal corrections with VC only. Attempted to use mirror in room for visual feedback however pt reported he was unable to raise head higher 2/2 pain to see himself. Pt directed in dynamic sitting balance training at EOB without pt BUE support at min A grossly with brief CGA after improving with initial static balance training. Pt directed in STS from EOB however with x2 attempts to initiate standing to RW, pt reported pain levels were too high and requested to not stand, pt never cleared EOB with either attempt despite max A to initiate and promote increased trunk extension and good technique. Pt directed in additional sitting EOB for 3 mins then pt stated he "had to lay down" and leaned to L side to attempt to return to supine, pt required max A ultimately to safely return to supine with BLE management onto bed  and repositioning of trunk. Once in supine, pt denied further therapy participation 2/2 pain and fatigue and reporting he "wanted to wait to have a bowel movement" despite offering in bed there ex and best efforts made. Nursing updated and attempted to encourage pt however pt ultimately denied further PT at this time. Pt left in bed, alarm set, All needs in reach and in good condition. Call light in hand.  Nursing aware.   Therapy Documentation Precautions:  Precautions Precautions: Cervical Precaution Booklet Issued: No Required Braces or Orthoses: Cervical Brace Cervical Brace: Soft collar Restrictions Weight Bearing Restrictions: No General: PT Amount of Missed Time (min): 40 Minutes PT Missed Treatment Reason: Pain;Patient fatigue Vital Signs: Therapy Vitals Temp: 98 F (36.7 C) Pulse Rate: 67 Resp: 18 BP: (!) 158/66 Patient Position (if appropriate): Lying Oxygen Therapy SpO2: 95 % O2 Device: Room Air Pain:    Mobility: Bed Mobility Bed Mobility: Sitting - Scoot to Edge of Bed;Sit to Supine Sitting - Scoot to [employer] of Bed: Minimal Assistance - Patient > 75% Sit to Supine: Moderate Assistance - Patient 50-74% Transfers Transfers: Sit to Stand;Stand Pivot Transfers Sit to Stand: Moderate Assistance - Patient 50-74% Stand Pivot Transfers: Moderate Assistance - Patient 50 - 74% Stand Pivot Transfer Details: Verbal cues for technique;Tactile cues for placement;Verbal cues for sequencing;Verbal cues for precautions/safety;Tactile cues for initiation Transfer (Assistive device): Rolling walker     Trunk/Postural Assessment : Cervical Assessment Cervical Assessment:  (s/p cervical sx.)  Balance: Balance Balance Assessed: Yes Static Sitting Balance Static Sitting - Balance Support: Bilateral upper extremity supported;Feet supported Static Sitting - Level of Assistance: 5: Stand by assistance Dynamic Sitting Balance Dynamic Sitting - Balance Support: Feet  supported;Bilateral upper extremity supported Dynamic Sitting - Level of Assistance: 4: Min assist Static Standing Balance Static Standing - Balance Support: Bilateral upper extremity supported Static Standing - Level of Assistance: 4: Min assist Dynamic Standing Balance Dynamic Standing - Balance Support: Bilateral upper extremity supported Dynamic Standing - Level of Assistance: 3: Mod assist Dynamic Standing - Comments: During functional activity    Therapy/Group: Individual Therapy  [name] 03/12/2020, 3:17 PM

## 2020-03-12 NOTE — Progress Notes (Signed)
Inpatient Rehabilitation  Patient information reviewed and entered into eRehab system by Loraine Freid M. Keiran Gaffey, M.A., CCC/SLP, PPS Coordinator.  Information including medical coding, functional ability and quality indicators will be reviewed and updated through discharge.    

## 2020-03-12 NOTE — Evaluation (Addendum)
Occupational Therapy Assessment and Plan  Patient Details  Name: [name] MRN: [id number] Date of Birth: [birth year]  OT Diagnosis: abnormal posture, acute pain and muscle weakness (generalized) Rehab Potential: Rehab Potential (ACUTE ONLY): Good ELOS: 21-24 days   Today's Date: 03/12/2020 OT Individual Time: 1779-3903 OT Individual Time Calculation (min): 60 min     Hospital Problem: Principal Problem:   Cervical myelopathy ([place]) Active Problems:   Hyperlipidemia associated with type 2 diabetes mellitus ([place])   Past Medical History:  Past Medical History:  Diagnosis Date  . Alcohol abuse    11-15-2017  per pt last alcohol Dec 2018  . BPH with obstruction/lower urinary tract symptoms   . Chronic arm pain   . Chronic pain    arms, leg, back  . CKD (chronic kidney disease), stage II   . Cocaine abuse ([place])    11-15-2017  per pt last used March 2019  . Congenital hydrocephalus, unspecified (HCC)    slow progression  . Diabetic retinopathy of both eyes ([place])   . Diastolic CHF, chronic ([place]) 07/2017  . Gait instability    multifactorial -- slow worsening congenital hydrocephalus, peripheral neuropathy, left C5-6 cord lesion  . Hematuria   . History of acute pulmonary edema 07/2017  . History of spinal cord injury 01/02/2014   pt fell, caused spinal cord contusion at C5-6--  residual left side weakness    . 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 History of TIA (transient ischemic attack) and stroke    hx cva 12-20-2010 and  TIA 02-22-2011--- no residual's from cva or tia  . Hyperlipidemia   . Hypertension    followed by pcp  . 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 Lesion of bladder   . Peripheral edema    chronic LUE  . Peripheral neuropathy   . Type 2 diabetes mellitus ([place])    followed by dr Karle Plumber--  last A1c 6.8 on 11-06-2017  . Weak urinary stream     Past Surgical History:  Past Surgical History:  Procedure Laterality Date  . CARDIAC CATHETERIZATION  12/01/2008   dr hochrein   abnormal stress myoview:  mild coronary plaque, normal LVF  . CARDIAC CATHETERIZATION  04/29/2011   dr [place]   in setting ECG with new ST elevation & severe hypertensive:  nonobstructive atherosclerotic CAD, normal LVF (30% mRCA)   . CARDIOVASCULAR STRESS TEST  08/15/2010   Low nuclear study w/ no evidence ishemia/  ef 42% with lateral and apical hypokinesis  . CYSTOSCOPY WITH BIOPSY N/A 11/20/2017   Procedure: CYSTOSCOPY WITH BIOPSY AND FULGURATION;  Surgeon: [name], MD;  Location: [facility];  Service: Urology;  Laterality: N/A;  . INCISION AND DRAINAGE RIGHT DISTAL MEDIAL THIGH HEMATOMA  04-14-2005   dr Ninfa Linden  . left arm skin graft  1976   injury  . LEFT HEART CATHETERIZATION WITH CORONARY ANGIOGRAM Bilateral 12/26/2013   Procedure: LEFT HEART CATHETERIZATION WITH CORONARY ANGIOGRAM;  Surgeon: Burnell Blanks, MD;  Location[facility] CATH LAB;  Service: Cardiovascular;  Laterality: Bilateral;  STEMI, in setting cocaine/ alcohol :  mid disease in the RCA (20%), moderate disease in the small caliber intermediate branch (distal 50-60%), normal LVSF (ef 55-60%)  . POSTERIOR CERVICAL FUSION/FORAMINOTOMY N/A 03/05/2020   Procedure: CERVICAL THREE-FOUR, CERVICAL FOUR-FIVE, CERVICAL FIVE-SIX POSTERIOR CERVICAL FUSION/FORAMINOTOMY;  Surgeon: Eustace Moore, MD;  Location: [facility];  Service:  Neurosurgery;  Laterality: N/A;  . TOOTH EXTRACTION N/A 05/16/2018   Procedure: DENTAL RESTORATION/EXTRACTIONS WITH ALVEO;  Surgeon: Diona Browner, DDS;  Location: [place];  Service: Oral Surgery;  Laterality: N/A;  . TRANSTHORACIC ECHOCARDIOGRAM  08/05/2017   moderate LVH,  ef 55%,  grade 1 diastolic dysfunction/  mild LAE/  trivial Tr    Assessment & Plan Clinical Impression: Presented 02/26/2020 progressive weakness/gait instability lower  extremities left greater than right as well as numbness and spasticity with stool incontinence. Admission chemistries unremarkable except glucose 209, BUN 26, creatinine 1.46, hemoglobin 14.7, hemoglobin A1c 7.1, SARS coronavirus negative. Recent CTs and brain MRIs demonstrated diffuse atrophy negative for acute infarct. X-rays and imaging revealed severe spinal stenosis/myelopathy at C3-4, C4-5. Patient underwent decompressive cervical laminectomy and medial facetectomyC3-4 4-5 with posterior cervical arthrodesis C3-C6 with lateral mass fixation 03/05/2020 per Dr. Sherley Bounds. Cervical soft collar as directed. Patient transferred to CIR on 03/11/2020 .    Patient currently requires max with basic self-care skills secondary to muscle weakness and muscle paralysis and impaired timing and sequencing, unbalanced muscle activation and decreased coordination.  Prior to hospitalization, patient could complete BADLs with min A.  Patient will benefit from skilled intervention to decrease level of assist with basic self-care skills and increase independence with basic self-care skills prior to discharge home with care partner.  Anticipate patient will require 24 hour supervision and follow up home health.  OT - End of Session Activity Tolerance: Tolerates 10 - 20 min activity with multiple rests Endurance Deficit: Yes Endurance Deficit Description: Increased need for RB OT Assessment Rehab Potential (ACUTE ONLY): Good OT Patient demonstrates impairments in the following area(s): Balance;Cognition;Endurance;Motor;Pain;Safety;Skin Integrity OT Basic ADL's Functional Problem(s): Grooming;Bathing;Dressing;Toileting OT Transfers Functional Problem(s): Toilet;Tub/Shower OT Additional Impairment(s): Fuctional Use of Upper Extremity OT Plan OT Intensity: Minimum of 1-2 x/day, 45 to 90 minutes OT Frequency: 5 out of 7 days OT Duration/Estimated Length of Stay: 21-24 days OT Treatment/Interventions:  Balance/vestibular training;Cognitive remediation/compensation;Community reintegration;Discharge planning;DME/adaptive equipment instruction;Functional mobility training;Neuromuscular re-education;Pain management;Patient/family education;Self Care/advanced ADL retraining;Skin care/wound managment;Therapeutic Activities;Therapeutic Exercise;UE/LE Strength taining/ROM;UE/LE Coordination activities;Wheelchair propulsion/positioning OT Self Feeding Anticipated Outcome(s): Independent OT Basic Self-Care Anticipated Outcome(s): Min A OT Toileting Anticipated Outcome(s): CGA OT Bathroom Transfers Anticipated Outcome(s): CGA OT Recommendation Patient destination: Home Follow Up Recommendations: 24 hour supervision/assistance;Home health OT Equipment Recommended: To be determined   OT Evaluation Precautions/Restrictions  Precautions Precautions: Cervical Precaution Booklet Issued: No Required Braces or Orthoses: Cervical Brace Cervical Brace: Soft collar Restrictions Weight Bearing Restrictions: No General Chart Reviewed: Yes Family/Caregiver Present: No Vital Signs Therapy Vitals Temp Source: Oral Pain Pain Assessment Pain Scale: 0-10 Pain Score: 10-Worst pain ever Pain Type: Surgical pain Pain Location: Neck Pain Descriptors / Indicators: Burning Home Living/Prior Functioning Home Living Family/patient expects to be discharged to:: Skilled nursing facility Living Arrangements: Alone Available Help at Discharge: Personal care attendant Type of Home: Apartment Home Access: Level entry Home Layout: One level Bathroom Shower/Tub: Chiropodist: Standard Bathroom Accessibility: Yes Additional Comments: pt reported aide comes 7 days/week for 2.25 hours  Lives With: Alone IADL History Homemaking Responsibilities: No Prior Function Level of Independence: Needs assistance with ADLs, Needs assistance with homemaking, Requires assistive device for independence, Other  (comment)  Able to Take Stairs?: Yes Driving: No Comments: Has aide 7 days a week for about 2 hours in am; Pt reports weakness/numbness in extremities (L worse than right) chronically but has worsened over the past few weeks Vision Baseline Vision/History: Wears glasses  Wears Glasses: At all times Patient Visual Report: No change from baseline Vision Assessment?: No apparent visual deficits Praxis Praxis: Impaired Cognition Overall Cognitive Status: No family/caregiver present to determine baseline cognitive functioning Arousal/Alertness: Awake/alert Orientation Level: Person;Place;Situation Person: Oriented Place: Oriented Situation: Oriented Year: 2021 Month: August Day of Week: Correct Memory: Impaired Memory Impairment: Decreased recall of new information;Decreased short term memory Decreased Short Term Memory: Verbal complex;Functional complex Immediate Memory Recall: Sock;Blue;Bed Memory Recall Sock: Not able to recall Memory Recall Blue: Without Cue Memory Recall Bed: Without Cue Awareness: Appears intact Problem Solving: Appears intact Safety/Judgment: Appears intact Sensation Sensation Light Touch: Impaired by gross assessment Proprioception: Impaired by gross assessment Coordination Gross Motor Movements are Fluid and Coordinated: No Fine Motor Movements are Fluid and Coordinated: No Motor  Motor Motor: Abnormal postural alignment and control;Paraplegia  Trunk/Postural Assessment  Cervical Assessment Cervical Assessment:  (s/p cervical sx.) Thoracic Assessment Thoracic Assessment: Exceptions to [facility] Lumbar Assessment Lumbar Assessment: Exceptions to [facility] Postural Control Postural Control: Deficits on evaluation  Balance Balance Balance Assessed: Yes Static Sitting Balance Static Sitting - Balance Support: Bilateral upper extremity supported;Feet supported Static Sitting - Level of Assistance: 5: Stand by assistance Dynamic Sitting Balance Dynamic  Sitting - Balance Support: Feet supported;Bilateral upper extremity supported Dynamic Sitting - Level of Assistance: 4: Min assist Sitting balance - Comments: pt able to weight shift sitting EOB but requires one UE support to maintain upright posture, unable to balance without physical assist for dynamic balance in sitting mod A Static Standing Balance Static Standing - Balance Support: Bilateral upper extremity supported Static Standing - Level of Assistance: 4: Min assist Dynamic Standing Balance Dynamic Standing - Balance Support: Bilateral upper extremity supported Dynamic Standing - Level of Assistance: 3: Mod assist Dynamic Standing - Comments: During functional activity Extremity/Trunk Assessment RUE Assessment RUE Assessment: Exceptions to [facility] Passive Range of Motion (PROM) Comments: PROM: WFL Active Range of Motion (AROM) Comments: Limited 2/2 cervical precautions. Patient able to actively flex shoulder to 90degrees. Elbow, wrist, and digits WFL. General Strength Comments: MMT grossly 3+/5 LUE Assessment LUE Assessment: Exceptions to [facility] Passive Range of Motion (PROM) Comments: PROM: WFL Active Range of Motion (AROM) Comments: Limited 2/2 cervical precautions. Patient able to actively flex shoulder to 90degrees. Elbow, wrist, and digits WFL. General Strength Comments: MMT grossly 3+/5  Care Tool Care Tool Self Care Eating   Eating Assist Level: Set up assist    Oral Care    Oral Care Assist Level: Minimal Assistance - Patient > 75%    Bathing   Body parts bathed by patient: Right arm;Left arm;Chest;Abdomen;Front perineal area;Face Body parts bathed by helper: Buttocks;Right upper leg;Left upper leg;Right lower leg;Left lower leg   Assist Level: Minimal Assistance - Patient > 75%    Upper Body Dressing(including orthotics)   What is the patient wearing?: Pull over shirt   Assist Level: Moderate Assistance - Patient 50 - 74%    Lower Body Dressing (excluding footwear)    What is the patient wearing?: Pants;Incontinence brief Assist for lower body dressing: Maximal Assistance - Patient 25 - 49%    Putting on/Taking off footwear   What is the patient wearing?: Non-skid slipper socks Assist for footwear: Total Assistance - Patient < 25%       Care Tool Toileting Toileting activity Toileting Activity did not occur (Clothing management and hygiene only): N/A (no void or bm)       Care Tool Bed Mobility Roll left and right activity   Roll  left and right assist level: Minimal Assistance - Patient > 75%    Sit to lying activity   Sit to lying assist level: Moderate Assistance - Patient 50 - 74%    Lying to sitting edge of bed activity   Lying to sitting edge of bed assist level: Moderate Assistance - Patient 50 - 74%     Care Tool Transfers Sit to stand transfer   Sit to stand assist level: Moderate Assistance - Patient 50 - 74%    Chair/bed transfer   Chair/bed transfer assist level: Moderate Assistance - Patient 50 - 74%     Toilet transfer Toilet transfer activity did not occur: Safety/medical concerns       Care Tool Cognition Expression of Ideas and Wants Expression of Ideas and Wants: Without difficulty (complex and basic) - expresses complex messages without difficulty and with speech that is clear and easy to understand   Understanding Verbal and Non-Verbal Content Understanding Verbal and Non-Verbal Content: Usually understands - understands most conversations, but misses some part/intent of message. Requires cues at times to understand   Memory/Recall Ability *first 3 days only Memory/Recall Ability *first 3 days only: Current season;Staff names and faces;That he or she is in a hospital/hospital unit;Location of own room    Refer to Care Plan for Long Term Goals  SHORT TERM GOAL WEEK 1 OT Short Term Goal 1 (Week 1): Patient will don UB clothing with set-up assist. OT Short Term Goal 2 (Week 1): Patient will thread BLE through LB  clothing with AE PRN. OT Short Term Goal 3 (Week 1): Patient will complete toilet transfers with Mod A. OT Short Term Goal 4 (Week 1): Patient will complete LB bathing will complete 1/3 parts of toileting.  Recommendations for other services: Other: TBD   Skilled Therapeutic Intervention Patient met seated in wc in agreement with OT evaluation/treatment. OT provided education on role of OT, rehab process, ELOS, and goals with patient expressing verbal understanding. Patient reporting 10/10 pain at rest and with activity in cervical neck (incisional). RN aware. UB bathing/dressing with Min A and LB bathing/dressing with Max A and use of RW in sitting/standing. Patient completed 3/3 grooming tasks seated at sink level with Min A. Short-distance ambulation with use of RW and Mod A with cues for walker management and step pattern to EOB. Return to supine with Mod A at trunk and LE. Patient would benefit from skilled OT services in setting of CIR to maximize safety and independence with self-care tasks, decrease risk of falls, and decrease caregiver burden in prep for safe d/c to next level of care.   ADL ADL Eating: Set up Where Assessed-Eating: Bed level Grooming: Minimal assistance Where Assessed-Grooming: Sitting at sink Upper Body Bathing: Minimal assistance Where Assessed-Upper Body Bathing: Sitting at sink Lower Body Bathing: Maximal assistance Where Assessed-Lower Body Bathing: Sitting at sink Upper Body Dressing: Moderate assistance Where Assessed-Upper Body Dressing: Sitting at sink Lower Body Dressing: Maximal assistance Where Assessed-Lower Body Dressing: Sitting at sink Mobility  Bed Mobility Bed Mobility: Sitting - Scoot to Edge of Bed;Sit to Supine Rolling Right: Minimal Assistance - Patient > 75% Rolling Left: Minimal Assistance - Patient > 75% Supine to Sit: Moderate Assistance - Patient 50-74% Sitting - Scoot to Edge of Bed: Minimal Assistance - Patient > 75% Sit to Supine:  Moderate Assistance - Patient 50-74% Scooting to [facility]: 2 Helpers Transfers Sit to Stand: Moderate Assistance - Patient 50-74%   Discharge Criteria: Patient will be discharged from OT  if patient refuses treatment 3 consecutive times without medical reason, if treatment goals not met, if there is a change in medical status, if patient makes no progress towards goals or if patient is discharged from hospital.  The above assessment, treatment plan, treatment alternatives and goals were discussed and mutually agreed upon: by patient  Juliahna Wiswell R [place] 03/12/2020, 12:37 PM

## 2020-03-12 NOTE — Evaluation (Signed)
Physical Therapy Assessment and Plan  Patient Details  Name: [name] MRN: [id number] Date of Birth: 10-15-1960  PT Diagnosis: Abnormal posture, Abnormality of gait, Difficulty walking, Impaired sensation, Muscle weakness and Paraplegia Rehab Potential: Good ELOS: 3-4 weeks   Today's Date: 03/12/2020 PT Individual Time: 0800-0900 PT Individual Time Calculation (min): 60 min    Hospital Problem: Principal Problem:   Cervical myelopathy ([place]) Active Problems:   Hyperlipidemia associated with type 2 diabetes mellitus ([place])   Past Medical History:  Past Medical History:  Diagnosis Date   Alcohol abuse    11-15-2017  per pt last alcohol Dec 2018   BPH with obstruction/lower urinary tract symptoms    Chronic arm pain    Chronic pain    arms, leg, back   CKD (chronic kidney disease), stage II    Cocaine abuse ([place])    11-15-2017  per pt last used March 2019   Congenital hydrocephalus, unspecified (HCC)    slow progression   Diabetic retinopathy of both eyes (HCC)    Diastolic CHF, chronic ([place]) 07/2017   Gait instability    multifactorial -- slow worsening congenital hydrocephalus, peripheral neuropathy, left C5-6 cord lesion   Hematuria    History of acute pulmonary edema 07/2017   History of spinal cord injury 01/02/2014   pt fell, caused spinal cord contusion at C5-6--  residual left side weakness     History of ST elevation myocardial infarction (STEMI) 12/26/2013   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   History of TIA (transient ischemic attack) and stroke    hx cva 12-20-2010 and  TIA 02-22-2011--- no residual's from cva or tia   Hyperlipidemia    Hypertension    followed by pcp   Left-sided weakness 12/2013   chronic due to spinal cord contusion   Lesion of bladder    Peripheral edema    chronic LUE   Peripheral neuropathy    Type 2 diabetes mellitus ([place])    followed by dr Karle Plumber--  last A1c 6.8 on 11-06-2017   Weak  urinary stream    Past Surgical History:  Past Surgical History:  Procedure Laterality Date   CARDIAC CATHETERIZATION  12/01/2008   dr hochrein   abnormal stress myoview:  mild coronary plaque, normal LVF   CARDIAC CATHETERIZATION  04/29/2011   dr Martinique   in setting ECG with new ST elevation & severe hypertensive:  nonobstructive atherosclerotic CAD, normal LVF (30% mRCA)    CARDIOVASCULAR STRESS TEST  08/15/2010   Low nuclear study w/ no evidence ishemia/  ef 42% with lateral and apical hypokinesis   CYSTOSCOPY WITH BIOPSY N/A 11/20/2017   Procedure: CYSTOSCOPY WITH BIOPSY AND FULGURATION;  Surgeon: [name], MD;  Location: [facility];  Service: Urology;  Laterality: N/A;   INCISION AND DRAINAGE RIGHT DISTAL MEDIAL THIGH HEMATOMA  04-14-2005   dr Ninfa Linden   left arm skin graft  1976   injury   LEFT HEART CATHETERIZATION WITH CORONARY ANGIOGRAM Bilateral 12/26/2013   Procedure: LEFT HEART CATHETERIZATION WITH CORONARY ANGIOGRAM;  Surgeon: Burnell Blanks, MD;  Location: [facility] CATH LAB;  Service: Cardiovascular;  Laterality: Bilateral;  STEMI, in setting cocaine/ alcohol :  mid disease in the RCA (20%), moderate disease in the small caliber intermediate branch (distal 50-60%), normal LVSF (ef 55-60%)   POSTERIOR CERVICAL FUSION/FORAMINOTOMY N/A 03/05/2020   Procedure: CERVICAL THREE-FOUR, CERVICAL FOUR-FIVE, CERVICAL FIVE-SIX POSTERIOR CERVICAL FUSION/FORAMINOTOMY;  Surgeon: Eustace Moore, MD;  Location: [facility];  Service:  Neurosurgery;  Laterality: N/A;   TOOTH EXTRACTION N/A 05/16/2018   Procedure: DENTAL RESTORATION/EXTRACTIONS WITH ALVEO;  Surgeon: Diona Browner, DDS;  Location: [facility];  Service: Oral Surgery;  Laterality: N/A;   TRANSTHORACIC ECHOCARDIOGRAM  08/05/2017   moderate LVH,  ef 55%,  grade 1 diastolic dysfunction/  mild LAE/  trivial Tr    Assessment & Plan Clinical Impression: Patient is a 59 y.o. year old male with recent  admission to the hospital on 59 year old right-handed male with history of alcohol/tobacco abuse, prior cocaine abuse complicated by STEMIin past,, BPH, chronic pain of low back, CKD stage II, diastolic congestive heart failure, type 2 diabetes mellitus, hyperlipidemia, morbid obesity with BMI 30.09, congenital hydrocephalus, history of spinal cord contusion/myelopathy/central cord syndrome at C5-6 after a fall with residual left-sided weakness 2015 seen by Dr. Sherley Bounds with conservative care. Per chart review patient lives alone. 1 level apartment. Has an aide 3 days a week for 2 hours at a time used a walker and cane prior to admission. Aide does assist with some ADLs. Presented 02/26/2020 progressive weakness/gait instability lower extremities left greater than right as well as numbness and spasticity with stool incontinence. Admission chemistries unremarkable except glucose 209, BUN 26, creatinine 1.46, hemoglobin 14.7, hemoglobin A1c 7.1, SARS coronavirus negative. Recent CTs and brain MRIs demonstrated diffuse atrophy negative for acute infarct. X-rays and imaging revealed severe spinal stenosis/myelopathy at C3-4, C4-5. Patient underwent decompressive cervical laminectomy and medial facetectomyC3-4 4-5 with posterior cervical arthrodesis C3-C6 with lateral mass fixation 03/05/2020 per Dr. Sherley Bounds. Cervical soft collar as directed. Decadron protocol initiated. Tolerating a regular consistency diet. Therapy evaluations completed and patient was admitted for a comprehensive rehab program.  Patient transferred to CIR on 03/11/2020 .   Patient currently requires mod with mobility secondary to muscle weakness, decreased cardiorespiratoy endurance, impaired timing and sequencing, unbalanced muscle activation and decreased coordination and decreased sitting balance, decreased standing balance, decreased postural control, decreased balance strategies and difficulty maintaining precautions.  Prior  to hospitalization, patient was min with mobility and ADLs and lived with Alone in a [place] home.  Home access is  Level entry.  Patient will benefit from skilled PT intervention to maximize safe functional mobility, minimize fall risk and decrease caregiver burden for planned discharge home with 24 hour assist.  Anticipate patient will benefit from follow up [facility]: Lambert Rhonda W at discharge.  PT - End of Session Activity Tolerance: Tolerates 30+ min activity with multiple rests Endurance Deficit: Yes PT Assessment Rehab Potential (ACUTE/IP ONLY): Good PT Barriers to Discharge: Decreased caregiver support;Home environment access/layout;Incontinence;Lack of/limited family support PT Barriers to Discharge Comments: WC unable to mobilize into bathroom PT Patient demonstrates impairments in the following area(s): Balance;Endurance;Motor;Sensory;Safety;Perception;Pain;Skin Integrity PT Transfers Functional Problem(s): Bed Mobility;Bed to Chair PT Locomotion Functional Problem(s): Ambulation;Wheelchair Mobility PT Plan PT Intensity: Minimum of 1-2 x/day ,45 to 90 minutes PT Frequency: 5 out of 7 days PT Duration Estimated Length of Stay: 3-4 weeks PT Treatment/Interventions: Ambulation/gait training;Discharge planning;DME/adaptive equipment instruction;Functional mobility training;Pain management;Psychosocial support;Splinting/orthotics;Therapeutic Activities;UE/LE Strength taining/ROM;Visual/perceptual remediation/compensation;Wheelchair propulsion/positioning;UE/LE Coordination activities;Therapeutic Exercise;Stair training;Skin care/wound management;Patient/family education;Neuromuscular re-education;Community reintegration;Balance/vestibular training;Disease management/prevention PT Transfers Anticipated Outcome(s): CGA PT Locomotion Anticipated Outcome(s): min A with LRAD PT Recommendation Recommendations for Other Services: Therapeutic Recreation consult Therapeutic Recreation Interventions: Stress  management Follow Up Recommendations: Home health PT;24 hour supervision/assistance Patient destination: Home Equipment Recommended: To be determined Equipment Details: pt reports he has BSC, shower seat, rollator, cane   PT Evaluation Precautions/Restrictions Precautions Precautions: Cervical Precaution Booklet Issued:  No Required Braces or Orthoses: Cervical Brace Cervical Brace: Soft collar (per EMR brace can be donned at bedside and ok to walk to bathroom without brace 8/20) Restrictions Weight Bearing Restrictions: No General   Vital Signs Pain Pain Assessment Pain Scale: 0-10 Pain Score: 8  Pain Type: Surgical pain Pain Location: Neck Pain Orientation: Posterior Pain Descriptors / Indicators: Burning Pain Onset: On-going Pain Intervention(s): Rest;Relaxation;Repositioned;Emotional support Home Living/Prior Functioning Home Living Available Help at Discharge: Personal care attendant Type of Home: Apartment Home Access: Level entry Home Layout: One level Bathroom Shower/Tub: Chiropodist: Standard Bathroom Accessibility: Yes Additional Comments: pt reported aide comes 7 days/week for 2.25 hours  Lives With: Alone Prior Function Level of Independence: Needs assistance with ADLs;Needs assistance with homemaking;Requires assistive device for independence;Other (comment) (pt reports assistant helps him with bathing, dressing, cleaning and cooking. uses rollator to ambulate in home short distances)  Able to Take Stairs?: Yes Driving: No Comments: Has aide 7 days a week for about 2 hours in am; Pt reports weakness/numbness in extremities (L worse than right) chronically but has worsened over the past few weeks Vision/Perception  Perception Perception: Impaired Praxis Praxis: Impaired  Cognition Overall Cognitive Status: No family/caregiver present to determine baseline cognitive functioning Arousal/Alertness: Awake/alert Orientation Level: Oriented  X4 Sensation Sensation Light Touch: Impaired by gross assessment (pt reports he had baseline sensation deficits L>R at calves and toes. post surgery, pt reports he thinks this has gotten better but still thinks he feels numb  but with gross assessment pt able to identify all light touch BLE thigh level to toes 5/5) Proprioception: Impaired by gross assessment Coordination Gross Motor Movements are Fluid and Coordinated: No Fine Motor Movements are Fluid and Coordinated: No Motor  Motor Motor: Abnormal postural alignment and control;Paraplegia   Trunk/Postural Assessment  Cervical Assessment Cervical Assessment: Exceptions to [facility] Thoracic Assessment Thoracic Assessment: Exceptions to [facility] Lumbar Assessment Lumbar Assessment: Exceptions to [facility] Postural Control Postural Control: Deficits on evaluation (forward head carriage, kyphotic, posterior pelvic tilt)  Balance Balance Balance Assessed: Yes Dynamic Sitting Balance Sitting balance - Comments: pt able to weight shift sitting EOB but requires one UE support to maintain upright posture, unable to balance without physical assist for dynamic balance in sitting mod A Extremity Assessment      RLE Assessment RLE Assessment: Exceptions to [facility] Passive Range of Motion (PROM) Comments: WFL Active Range of Motion (AROM) Comments: WFL General Strength Comments: hip flexion 3+/5; knee extension 3+/5; knee flexion 3+/5; ankle DF 4/5, PF 4+/5 LLE Assessment LLE Assessment: Exceptions to [facility] Passive Range of Motion (PROM) Comments: WFL Active Range of Motion (AROM) Comments: WFL General Strength Comments: L hip flexion 3/5; knee extension 3/5 and flexion 3+/5; ankle DF 3/5 and PF 3+/5  Care Tool Care Tool Bed Mobility Roll left and right activity        Sit to lying activity        Lying to sitting edge of bed activity         Care Tool Transfers Sit to stand transfer        Chair/bed transfer         Toilet transfer         Car transfer          Care Tool Locomotion Ambulation          Walk 10 feet activity         Walk 50 feet with 2 turns activity  Walk 150 feet activity        Walk 10 feet on uneven surfaces activity        Stairs          Walk up/down 1 step activity          Walk up/down 4 steps activity      Walk up/down 12 steps activity        Pick up small objects from floor        Wheelchair            Wheel 50 feet with 2 turns activity      Wheel 150 feet activity        Refer to Care Plan for Long Term Goals  SHORT TERM GOAL WEEK 1 PT Short Term Goal 1 (Week 1): pt to demonstate min A x1 bed mobility PT Short Term Goal 2 (Week 1): pt to demonstrate min A x1 functional transfers with LRAD PT Short Term Goal 3 (Week 1): pt to demonstrate 80' with LRAD mod A x1 PT Short Term Goal 4 (Week 1): pt to demonstrate dynamic sitting balance for functional tasks at min A  Recommendations for other services: Therapeutic Recreation  Stress management  Skilled Therapeutic Intervention Evaluation completed (see details above and below) with education on PT POC and goals and individual treatment initiated with focus on bed mobility, transfers, sitting and standing balance, safety awareness, gait training, WC mobility. pt received in bed and agreeable to therapy. Pt directed in bed mobility rolling to pt's R min A, sit>supine with log rolling technique used, no bed rails, VC for technique and overall mod A for fully sitting EOB, min A static sitting balance at EOB improved to CGA with extra time and VC for BUE support, pt unable to remove BUE for functional tasks at eval without LOB, attempted without BUE then one UE for support at bedside however pt required mod A to remain at midline without LOB. PT donned soft collar at bedside at total assist for safety and time management, PT donned B socks total A as pt reported he was unable to complete this task. Pt directed in STS  from elevated EOB to RW at min A, VC for hand placement, x5 steps to L with RW to recliner mod A with intermittent need of L knee blocking for safety and VC for technique to increase foot clearance on BLE and step length on BLE L>R. Pt required max A to sit safety with poor eccentric control of decline to chair. x5 STS directed from recliner level to RW post rest break mod A with extra time to come to full standing, no LOB or L knee buckling noted, VC for hand placement and safety with sitting, this improved by rep 4. Pt directed in 5' gait training with RW in room modA-max A for safety and technique, pt directed to sit in WC at this distance, mod A to sit safely. Pt directed in [place] mobility in room 10' total with BUE use for propulsion, mod A for straight path, max A for narrow, small turns. Pt left in WC, alarm pad set, All needs in reach and in good condition. Call light in hand.  Drainage noted from surgical site, pt's nurse informed and verbalized understanding.   Mobility Bed Mobility Bed Mobility: Rolling Left;Supine to Sit;Sitting - Scoot to [place] of Bed;Scooting to [facility];Rolling Right Rolling Right: Minimal Assistance - Patient > 75% Rolling Left: Minimal Assistance - Patient > 75% Supine to  Sit: Moderate Assistance - Patient 50-74% Sitting - Scoot to Edge of Bed: Minimal Assistance - Patient > 75% Scooting to [facility]: 2 Helpers Transfers Transfers: Sit to [facility] Sit to Stand: Moderate Assistance - Patient 50-74% Stand Pivot Transfers: Moderate Assistance - Patient 50 - 74% Stand Pivot Transfer Details: Verbal cues for technique;Tactile cues for placement;Verbal cues for sequencing;Verbal cues for precautions/safety;Tactile cues for initiation Stand Pivot Transfer Details (indicate cue type and reason): difficulty lifting B feet L>R and demonstrats scooting feet Transfer (Assistive device): Rolling walker Locomotion  Gait Ambulation: Yes Gait Assistance: Moderate Assistance  - Patient 50-74% Gait Distance (Feet): 5 Feet Assistive device: Rolling walker Gait Assistance Details: Tactile cues for initiation;Tactile cues for weight shifting;Verbal cues for technique;Verbal cues for gait pattern;Tactile cues for sequencing;Verbal cues for sequencing;Verbal cues for safe use of DME/AE Gait Assistance Details: poor step clearance and cadence Gait Gait: Yes Gait Pattern: Impaired Gait Pattern: Step-to pattern;Decreased step length - left;Decreased step length - right;Decreased hip/knee flexion - right;Decreased hip/knee flexion - left;Shuffle;Poor foot clearance - right;Poor foot clearance - left Gait velocity: decreased Stairs / Additional Locomotion Stairs: No Wheelchair Mobility Wheelchair Mobility: Yes Wheelchair Assistance: Moderate Assistance - Patient 50 - 74% Wheelchair Propulsion: Both upper extremities Wheelchair Parts Management: Needs assistance;Supervision/cueing Distance: 10'   Discharge Criteria: Patient will be discharged from PT if patient refuses treatment 3 consecutive times without medical reason, if treatment goals not met, if there is a change in medical status, if patient makes no progress towards goals or if patient is discharged from hospital.  The above assessment, treatment plan, treatment alternatives and goals were discussed and mutually agreed upon: by patient  [name] 03/12/2020, 9:19 AM

## 2020-03-12 NOTE — Progress Notes (Addendum)
[place] PHYSICAL MEDICINE & REHABILITATION PROGRESS NOTE   Subjective/Complaints:   Pt reports no BM yet- is passing gas, however LBM 7-8 days ago- normally goes 1x/week at home.   Slept well- denies needing soft collar anymore.  Only used for comfort.   Pain not quite as bad as it was- mainly in posterior neck.    ROS:  Pt denies SOB, abd pain, CP, N/V/C/D, and vision changes   Objective:   No results found. Recent Labs    03/10/20 0149 03/12/20 0804  WBC 19.5* 20.1*  HGB 13.0 12.4*  HCT 40.2 38.4*  PLT 258 234   Recent Labs    03/10/20 0149 03/12/20 0804  NA 132* 133*  K 5.1 5.1  CL 102 99  CO2 23 25  GLUCOSE 196* 323*  BUN 44* 60*  CREATININE 1.20 1.30*  CALCIUM 8.5* 9.0    Intake/Output Summary (Last 24 hours) at 03/12/2020 1254 Last data filed at 03/12/2020 0820 Gross per 24 hour  Intake 460 ml  Output --  Net 460 ml     Physical Exam: Vital Signs Blood pressure (!) 140/43, pulse 71, temperature 98 F (36.7 C), resp. rate 20, height 6\' 3"  (1.905 m), weight 108.7 kg, SpO2 98 %.  Constitutional: pt laying in bed- appropriate, afebrile, NAD- joking some HENT: conjugate gaze- posterior incision looks great- no erythema- staples in tact  Cardiovascular: RRR Pulmonary: CTA B/L- no W/R/R- good air movement Abdominal: Soft, NT, (+)BS - passing gas, nut abd appears somewhat distended at this time Musculoskeletal:  Comments: UEs- biceps 5-/5, WE 4+/5, triceps 4-/5, grip 4-/5, finger abd 3+/5 B/L LEs- R HF 4+/5; L HF 4/5- otherwise 4+/5 in KE, DF, PF and EHL B/L Skin:No heel bogginess B/L- posterior incision of neck looks great as above Neurological: Ox3 Decreased sensation to light touch in L1 to S2 B/L- otherwise intact in UEs and torso B/L No clonus B/L Hoffman's (+) B/L MAS of 1 in LUE and LEs- MAS of 0 in RUE Psychiatric: joking some- cordial   Assessment/Plan: 1. 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  Physiatrist is providing close team supervision and 24 hour management of active medical problems listed below.  Physiatrist and rehab team continue to assess barriers to discharge/monitor patient progress toward functional and medical goals  Care Tool:  Bathing    Body parts bathed by patient: Right arm, Left arm, Chest, Abdomen, Front perineal area, Face   Body parts bathed by helper: Buttocks, Right upper leg, Left upper leg, Right lower leg, Left lower leg     Bathing assist Assist Level: Minimal Assistance - Patient > 75%     Upper Body Dressing/Undressing Upper body dressing   What is the patient wearing?: Pull over shirt    Upper body assist Assist Level: Moderate Assistance - Patient 50 - 74%    Lower Body Dressing/Undressing Lower body dressing      What is the patient wearing?: Pants, Incontinence brief     Lower body assist Assist for lower body dressing: Maximal Assistance - Patient 25 - 49%     Toileting Toileting Toileting Activity did not occur (Clothing management and hygiene only): N/A (no void or bm)  Toileting assist       Transfers Chair/bed transfer  Transfers assist     Chair/bed transfer assist level: Moderate Assistance -  Patient 50 - 74%     Locomotion Ambulation   Ambulation assist      Assist level: Maximal Assistance - Patient 25 - 49% Assistive device: Walker-rolling Max distance: 5   Walk 10 feet activity   Assist  Walk 10 feet activity did not occur: Safety/medical concerns        Walk 50 feet activity   Assist Walk 50 feet with 2 turns activity did not occur: Safety/medical concerns         Walk 150 feet activity   Assist Walk 150 feet activity did not occur: Safety/medical concerns         Walk 10 feet on uneven surface  activity   Assist Walk  10 feet on uneven surfaces activity did not occur: Safety/medical concerns         Wheelchair     Assist Will patient use wheelchair at discharge?: Yes Type of Wheelchair: Manual    Wheelchair assist level: Total Assistance - Patient < 25% Max wheelchair distance: 10    Wheelchair 50 feet with 2 turns activity    Assist        Assist Level: Maximal Assistance - Patient 25 - 49%   Wheelchair 150 feet activity     Assist      Assist Level: Total Assistance - Patient < 25%   Blood pressure (!) 140/43, pulse 71, temperature 98 F (36.7 C), resp. rate 20, height 6\' 3"  (1.905 m), weight 108.7 kg, SpO2 98 %.  Medical Problem List and Plan: 1.Cervical myelopathysecondary to cervical spinal cord compression status post cervical laminectomy and medialfacetectomyC3-4 4-5 posterior cervical arthrodesis C3-C6. Cervical collar at all times? 8/20- pt was given soft collar- refuses to wear it -patient may Shower with covering incision -ELOS/Goals: min A to Mod I, 3 weeks 2. Antithrombotics: -DVT/anticoagulation:SCDs. Check vascular study 8/20- checking with [facility]- hopeful will let us use Lovenox- surgery 8/13- have called- L/M  -have gotten approval from Dr Ronnald Ramp to start Lovenox- did so -antiplatelet therapy: N/A 3. Pain Management:Neurontin 300 mg twice daily,baclofen 5 mg 3 times daily,Cymbalta 60 mg daily, oxycodone as needed  4. Mood:Provide emotional support -antipsychotic agents: N/A 5. Neuropsych: This patientiscapable of making decisions on hisown behalf. 6. Skin/Wound Care:Routine skin checks 7. Fluids/Electrolytes/Nutrition:Routine in and outs with follow-up chemistries 8. Hypertension. Hydralazine 25 mg twice daily, labetalol 100 mg twice daily, lisinopril 40 mg daily. Monitor with increased mobility 9. Diabetes mellitus with peripheral neuropathy. Hemoglobin A1c 7.1. NovoLog 12units 3  times daily with meals, Lantus insulin 55units daily. Check blood sugars before meals and at bedtime. Diabetic teaching CBG (last 3)  Recent Labs    03/11/20 1638 03/12/20 0657 03/12/20 1136  GLUCAP 97 345* 110*    8/20- BG's very labile- if no better over weekend, will call DM coordinator 10. CKD stage II. Creatinine baseline 1.46. Follow-up chemistries  8/20- Baseline is 1.4- is currently Cr of 1.3 and BUN 60- is dry- will ask pt to drink a little more, recheck labs this weekend, and if need be, hold Lasix dose this weekend 11. Morbid obesity. BMI 30.09. Dietary follow-up 12. History of alcohol tobacco cocaine use. Providing counseling 13. Diastolic congestive heart failure. Lasix 40 mg daily. Monitor with increased mobility   Filed Weights   03/11/20 1809 03/12/20 0500  Weight: 108.9 kg 108.7 kg    8/20- no weight today- however is ordered- will monitor weights 14. Hyperlipidemia. Lipitor 15. Neurogenic bowel and bladder- goes every 1 week - which is  likely the slowing of neurogenic bowel - need to improve speed; also has urinary urgency, which is also neurogenic- will monitor and treat as required.   8/20- gave Sorbitol to get pt to have BM today- has been 7-8 days- usually goes 1x/week 16. Spasticity- on Baclofen 5mg  TID- adequately controlled- mainly annoying, not painful- will monitor muscle spasms. 17. Hyperkalemia  8/20- Not on any K+ replacement- K+ is 5.1-max level of normal- will monitor closely.   18. Leukocytosis-   8/20- on Decadron slow taper- WBC 20k- will check U/A and Cx just in case- afebrile- but will monitor for sx's of infection.     LOS: 1 days A FACE TO FACE EVALUATION WAS PERFORMED  Dawsyn Ramsaran 03/12/2020, 12:54 PM

## 2020-03-12 NOTE — Progress Notes (Signed)
Inpatient Rehabilitation Center Individual Statement of Services  Patient Name:  [name]  Date:  03/12/2020  Welcome to the [facility].  Our goal is to provide you with an individualized program based on your diagnosis and situation, designed to meet your specific needs.  With this comprehensive rehabilitation program, you will be expected to participate in at least 3 hours of rehabilitation therapies Monday-Friday, with modified therapy programming on the weekends.  Your rehabilitation program will include the following services:  Physical Therapy (PT), Occupational Therapy (OT), Speech Therapy (ST), 24 hour per day rehabilitation nursing, Therapeutic Recreaction (TR), Neuropsychology, Care Coordinator, Rehabilitation Medicine, Nutrition Services and Pharmacy Services  Weekly team conferences will be held on Tuesday to discuss your progress.  Your Inpatient Rehabilitation Care Coordinator will talk with you frequently to get your input and to update you on team discussions.  Team conferences with you and your family in attendance may also be held.  Expected length of stay: 20-22 days  Overall anticipated outcome: Modified Independent with PT/OT, Independent with SLP  Depending on your progress and recovery, your program may change. Your Inpatient Rehabilitation Care Coordinator will coordinate services and will keep you informed of any changes. Your Inpatient Rehabilitation Care Coordinator's name and contact numbers are listed  below.  The following services may also be recommended but are not provided by the [facility]:    [place] will be made to provide these services after discharge if needed.  Arrangements include referral to agencies that provide these services.  Your insurance has been verified to be:  Medicare/Medicare Part A&B Your primary doctor is:   Karle Plumber  Pertinent information will be shared with your doctor and your insurance company.  Inpatient Rehabilitation Care Coordinator:  Cathleen Corti [phone] or [phone]  Information discussed with and copy given to patient by: Cristi Loron, 03/12/2020, 10:39 AM

## 2020-03-13 ENCOUNTER — Inpatient Hospital Stay (HOSPITAL_COMMUNITY): Payer: Medicare Other

## 2020-03-13 DIAGNOSIS — I5032 Chronic diastolic (congestive) heart failure: Secondary | ICD-10-CM

## 2020-03-13 DIAGNOSIS — R7309 Other abnormal glucose: Secondary | ICD-10-CM

## 2020-03-13 DIAGNOSIS — K560 Paralytic ileus: Secondary | ICD-10-CM

## 2020-03-13 LAB — CBC WITH DIFFERENTIAL/PLATELET
Abs Immature Granulocytes: 0.25 10*3/uL — ABNORMAL HIGH (ref 0.00–0.07)
Basophils Absolute: 0 10*3/uL (ref 0.0–0.1)
Basophils Relative: 0 %
Eosinophils Absolute: 0 10*3/uL (ref 0.0–0.5)
Eosinophils Relative: 0 %
HCT: 41.4 % (ref 39.0–52.0)
Hemoglobin: 13.4 g/dL (ref 13.0–17.0)
Immature Granulocytes: 1 %
Lymphocytes Relative: 8 %
Lymphs Abs: 1.5 10*3/uL (ref 0.7–4.0)
MCH: 27.3 pg (ref 26.0–34.0)
MCHC: 32.4 g/dL (ref 30.0–36.0)
MCV: 84.3 fL (ref 80.0–100.0)
Monocytes Absolute: 1.6 10*3/uL — ABNORMAL HIGH (ref 0.1–1.0)
Monocytes Relative: 9 %
Neutro Abs: 15.6 10*3/uL — ABNORMAL HIGH (ref 1.7–7.7)
Neutrophils Relative %: 82 %
Platelets: 243 10*3/uL (ref 150–400)
RBC: 4.91 MIL/uL (ref 4.22–5.81)
RDW: 14 % (ref 11.5–15.5)
WBC: 19 10*3/uL — ABNORMAL HIGH (ref 4.0–10.5)
nRBC: 0 % (ref 0.0–0.2)

## 2020-03-13 LAB — BASIC METABOLIC PANEL
Anion gap: 11 (ref 5–15)
BUN: 51 mg/dL — ABNORMAL HIGH (ref 8–23)
CO2: 22 mmol/L (ref 22–32)
Calcium: 9 mg/dL (ref 8.9–10.3)
Chloride: 100 mmol/L (ref 98–111)
Creatinine, Ser: 0.94 mg/dL (ref 0.61–1.24)
GFR calc Af Amer: >60 mL/min (ref >60)
GFR calc non Af Amer: >60 mL/min (ref >60)
Glucose, Bld: 103 mg/dL — ABNORMAL HIGH (ref 70–99)
Potassium: 5 mmol/L (ref 3.5–5.1)
Sodium: 133 mmol/L — ABNORMAL LOW (ref 135–145)

## 2020-03-13 LAB — GLUCOSE, CAPILLARY
Glucose-Capillary: 94 mg/dL (ref 70–99)
Glucose-Capillary: 41 mg/dL — CL (ref 70–99)
Glucose-Capillary: 79 mg/dL (ref 70–99)
Glucose-Capillary: 71 mg/dL (ref 70–99)
Glucose-Capillary: 138 mg/dL — ABNORMAL HIGH (ref 70–99)
Glucose-Capillary: 168 mg/dL — ABNORMAL HIGH (ref 70–99)

## 2020-03-13 MED ORDER — SODIUM CHLORIDE 0.45 % NICU IV INFUSION SIMPLE
INJECTION | INTRAVENOUS | Status: DC
  Filled 2020-03-13 (×2): qty 500

## 2020-03-13 MED ORDER — IOHEXOL 9 MG/ML PO SOLN
500.0000 mL | ORAL | Status: AC
  Administered 2020-03-13: 500 mL via ORAL

## 2020-03-13 MED ORDER — METOCLOPRAMIDE HCL 5 MG/ML IJ SOLN
10.0000 mg | Freq: Four times a day (QID) | INTRAMUSCULAR | Status: DC
  Administered 2020-03-13 – 2020-03-14 (×5): 10 mg via INTRAVENOUS
  Filled 2020-03-13 (×5): qty 2

## 2020-03-13 MED ORDER — SODIUM CHLORIDE 0.45 % IV SOLN: INTRAVENOUS | Status: DC

## 2020-03-13 MED ORDER — IOHEXOL 9 MG/ML PO SOLN
ORAL | Status: AC
  Administered 2020-03-13: 500 mL via ORAL
  Filled 2020-03-13: qty 1000

## 2020-03-13 MED ORDER — GLUCOSE 40 % PO GEL
1.0000 | Freq: Once | ORAL | Status: AC
  Administered 2020-03-13: 37.5 g via ORAL

## 2020-03-13 MED ORDER — INSULIN GLARGINE 100 UNIT/ML ~~LOC~~ SOLN
45.0000 [IU] | Freq: Every day | SUBCUTANEOUS | Status: DC
  Administered 2020-03-14 – 2020-03-20 (×7): 45 [IU] via SUBCUTANEOUS
  Filled 2020-03-13 (×10): qty 0.45

## 2020-03-13 NOTE — Progress Notes (Signed)
Pt with 2 large bm's this evening. KUB previously demonstrated significant colonic ileus and ?SBO. CT of abdomen demonstrates already improved colonic ileus and no SBO. Continue NPO, IV reglan. F/u KUB and labs ordered for morning.   Meredith Staggers, MD, [facility] 03/13/2020

## 2020-03-13 NOTE — Progress Notes (Signed)
[place] PHYSICAL MEDICINE & REHABILITATION PROGRESS NOTE   Subjective/Complaints: Low cbg's last night and again this morning. Pt c/o abdominal pain when he eats. Had small bm after I saw him this morning. No bm except for liquid bm on 8/17 prior to today  ROS: Patient denies fever, rash, sore throat, blurred vision, nausea, vomiting, diarrhea, cough, shortness of breath or chest pain, joint or back pain, headache, or mood change.    Objective:   No results found. Recent Labs    03/12/20 0804 03/13/20 0512  WBC 20.1* 19.0*  HGB 12.4* 13.4  HCT 38.4* 41.4  PLT 234 243   Recent Labs    03/12/20 0804 03/13/20 0512  NA 133* 133*  K 5.1 5.0  CL 99 100  CO2 25 22  GLUCOSE 323* 103*  BUN 60* 51*  CREATININE 1.30* 0.94  CALCIUM 9.0 9.0    Intake/Output Summary (Last 24 hours) at 03/13/2020 0926 Last data filed at 03/13/2020 0500 Gross per 24 hour  Intake 580 ml  Output 850 ml  Net -270 ml     Physical Exam: Vital Signs Blood pressure (!) 152/67, pulse 72, temperature 98.2 F (36.8 C), resp. rate 16, height 6\' 3"  (1.905 m), weight 105 kg, SpO2 95 %.  Constitutional: No distress . Vital signs reviewed. obese HEENT: EOMI, oral membranes moist Neck: supple Cardiovascular: RRR without murmur. No JVD    Respiratory/Chest: CTA Bilaterally without wheezes or rales. Normal effort    GI/Abdomen: BS scarce and high pitched. Distended, tender Ext: no clubbing, cyanosis, or edema Psych: pleasant and cooperative Neuro:  Comments: UEs- biceps 5-/5, WE 4+/5, triceps 4-/5, grip 4-/5, finger abd 3+/5 B/L LEs- R HF 4+/5; L HF 4/5- otherwise 4+/5 in KE, DF, PF and EHL B/L   Ox3 Decreased sensation to light touch in L1 to S2 B/L- otherwise intact in UEs and torso B/L MAS of 1 in LUE and LEs- MAS of 0 in RUE--stale Skin:intact  Assessment/Plan: 1. 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  Physiatrist is providing close team supervision and 24 hour management of active medical problems listed below.  Physiatrist and rehab team continue to assess barriers to discharge/monitor patient progress toward functional and medical goals  Care Tool:  Bathing    Body parts bathed by patient: Right arm, Left arm, Chest, Abdomen, Front perineal area, Face   Body parts bathed by helper: Buttocks, Right upper leg, Left upper leg, Right lower leg, Left lower leg     Bathing assist Assist Level: Minimal Assistance - Patient > 75%     Upper Body Dressing/Undressing Upper body dressing   What is the patient wearing?: Hospital gown only    Upper body assist Assist Level: Minimal Assistance - Patient > 75%    Lower Body Dressing/Undressing Lower body dressing      What is the patient wearing?: Incontinence brief     Lower body assist Assist for lower body dressing: Maximal Assistance - Patient 25 - 49%     Toileting Toileting Toileting Activity did not occur (Clothing management and hygiene only): N/A (no void or bm)  Toileting assist Assist for toileting: Moderate Assistance - Patient 50 - 74%     Transfers Chair/bed transfer  Transfers assist     Chair/bed transfer assist level: Moderate Assistance - Patient 50 - 74%  Locomotion Ambulation   Ambulation assist      Assist level: Maximal Assistance - Patient 25 - 49% Assistive device: Walker-rolling Max distance: 5   Walk 10 feet activity   Assist  Walk 10 feet activity did not occur: Safety/medical concerns        Walk 50 feet activity   Assist Walk 50 feet with 2 turns activity did not occur: Safety/medical concerns         Walk 150 feet activity   Assist Walk 150 feet activity did not occur: Safety/medical concerns         Walk 10 feet on uneven surface  activity   Assist Walk 10 feet on  uneven surfaces activity did not occur: Safety/medical concerns         Wheelchair     Assist Will patient use wheelchair at discharge?: Yes Type of Wheelchair: Manual    Wheelchair assist level: Total Assistance - Patient < 25% Max wheelchair distance: 10    Wheelchair 50 feet with 2 turns activity    Assist        Assist Level: Maximal Assistance - Patient 25 - 49%   Wheelchair 150 feet activity     Assist      Assist Level: Total Assistance - Patient < 25%   Blood pressure (!) 152/67, pulse 72, temperature 98.2 F (36.8 C), resp. rate 16, height 6\' 3"  (1.905 m), weight 105 kg, SpO2 95 %.  Medical Problem List and Plan: 1.Cervical myelopathysecondary to cervical spinal cord compression status post cervical laminectomy and medialfacetectomyC3-4 4-5 posterior cervical arthrodesis C3-C6. Cervical collar at all times? 8/20- pt was given soft collar- refuses to wear it -patient may Shower with covering incision -ELOS/Goals: min A to Mod I, 3 weeks 2. Antithrombotics: -DVT/anticoagulation: .  vascular study ok  8/21 lovenox  -antiplatelet therapy: N/A 3. Pain Management:Neurontin 300 mg twice daily,baclofen 5 mg 3 times daily,Cymbalta 60 mg daily, oxycodone as needed  4. Mood:Provide emotional support -antipsychotic agents: N/A 5. Neuropsych: This patientiscapable of making decisions on hisown behalf. 6. Skin/Wound Care:Routine skin checks 7. Fluids/Electrolytes/Nutrition:Routine in and outs with follow-up chemistries 8. Hypertension. Hydralazine 25 mg twice daily, labetalol 100 mg twice daily, lisinopril 40 mg daily. Monitor with increased mobility 9. Diabetes mellitus with peripheral neuropathy. Hemoglobin A1c 7.1. NovoLog 12units 3 times daily with meals, Lantus insulin 55units daily. Check blood sugars before meals and at bedtime. Diabetic teaching CBG (last 3)  Recent Labs     03/12/20 2135 03/12/20 2207 03/13/20 0625  GLUCAP 31* 74 94    8/20-21- BG's remain very labile-    -po intake inconsistent also   -will reduce lantus   -add sliding scale of novolog based on how much he eats    -call DM coordinator Monday 10. CKD stage II. Creatinine baseline 1.46. Follow-up chemistries  8/20- Baseline is 1.4-   8/21 Cr 0.94 today, holding lasix for now 11. Morbid obesity. BMI 30.09. Dietary follow-up 12. History of alcohol tobacco cocaine use. Providing counseling 13. Diastolic congestive heart failure. Lasix 40 mg daily. Monitor with increased mobility   Filed Weights   03/11/20 1809 03/12/20 0500 03/13/20 0459  Weight: 108.9 kg 108.7 kg 105 kg    8/21 weights down 14. Hyperlipidemia. Lipitor 15. Neurogenic bowel and bladder- goes every 1 week - which is likely the slowing of neurogenic bowel - need to improve speed; also has urinary urgency, which is also neurogenic- will monitor and treat as required.   8/20- gave  Sorbitol to get pt to have BM today- has been 7-8 days- usually goes 1x/week  8/21-check KUB today as pt having abdominal pain, distention, small bm this morning.  16. Spasticity- on Baclofen 5mg  TID- adequately controlled- mainly annoying, not painful- will monitor muscle spasms. 17. Hyperkalemia  8/20- Not on any K+ replacement- K+ is 5.1-max level of normal- will monitor closely.   18. Leukocytosis-   8/20- on Decadron slow taper- WBC 20k- will check U/A and Cx just in case- afebrile- but will monitor for sx's of infection.    8/21 ua +, ucx pending--> await results   Wbcs 19k LOS: 2 days A FACE TO FACE EVALUATION WAS PERFORMED  Meredith Staggers 03/13/2020, 9:26 AM

## 2020-03-13 NOTE — Progress Notes (Signed)
Attempted lower extremity venous duplex, however patient was not in his room. Will attempt again tomorrow as schedule permits.  03/13/2020 4:16 PM Kelby Aline., MHA, RVT, RDCS, RDMS

## 2020-03-14 ENCOUNTER — Inpatient Hospital Stay (HOSPITAL_COMMUNITY): Payer: Medicare Other

## 2020-03-14 ENCOUNTER — Inpatient Hospital Stay (HOSPITAL_COMMUNITY): Payer: Medicare Other | Admitting: Physical Therapy

## 2020-03-14 ENCOUNTER — Inpatient Hospital Stay (HOSPITAL_COMMUNITY): Payer: Medicare Other | Admitting: Occupational Therapy

## 2020-03-14 LAB — GLUCOSE, CAPILLARY
Glucose-Capillary: 130 mg/dL — ABNORMAL HIGH (ref 70–99)
Glucose-Capillary: 107 mg/dL — ABNORMAL HIGH (ref 70–99)
Glucose-Capillary: 34 mg/dL — CL (ref 70–99)
Glucose-Capillary: 108 mg/dL — ABNORMAL HIGH (ref 70–99)

## 2020-03-14 LAB — COMPREHENSIVE METABOLIC PANEL
ALT: 28 U/L (ref 0–44)
AST: 16 U/L (ref 15–41)
Albumin: 2.6 g/dL — ABNORMAL LOW (ref 3.5–5.0)
Alkaline Phosphatase: 52 U/L (ref 38–126)
Anion gap: 9 (ref 5–15)
BUN: 35 mg/dL — ABNORMAL HIGH (ref 8–23)
CO2: 27 mmol/L (ref 22–32)
Calcium: 8.7 mg/dL — ABNORMAL LOW (ref 8.9–10.3)
Chloride: 97 mmol/L — ABNORMAL LOW (ref 98–111)
Creatinine, Ser: 1 mg/dL (ref 0.61–1.24)
GFR calc Af Amer: >60 mL/min (ref >60)
GFR calc non Af Amer: >60 mL/min (ref >60)
Glucose, Bld: 127 mg/dL — ABNORMAL HIGH (ref 70–99)
Potassium: 5.7 mmol/L — ABNORMAL HIGH (ref 3.5–5.1)
Sodium: 133 mmol/L — ABNORMAL LOW (ref 135–145)
Total Bilirubin: 0.8 mg/dL (ref 0.3–1.2)
Total Protein: 6 g/dL — ABNORMAL LOW (ref 6.5–8.1)

## 2020-03-14 LAB — CBC
HCT: 40.3 % (ref 39.0–52.0)
Hemoglobin: 13 g/dL (ref 13.0–17.0)
MCH: 27.1 pg (ref 26.0–34.0)
MCHC: 32.3 g/dL (ref 30.0–36.0)
MCV: 84.1 fL (ref 80.0–100.0)
Platelets: 246 10*3/uL (ref 150–400)
RBC: 4.79 MIL/uL (ref 4.22–5.81)
RDW: 13.7 % (ref 11.5–15.5)
WBC: 21.4 10*3/uL — ABNORMAL HIGH (ref 4.0–10.5)
nRBC: 0 % (ref 0.0–0.2)

## 2020-03-14 MED ORDER — GLUCOSE 40 % PO GEL
ORAL | Status: AC
  Filled 2020-03-14: qty 1

## 2020-03-14 MED ORDER — SODIUM POLYSTYRENE SULFONATE 15 GM/60ML PO SUSP
30.0000 g | Freq: Once | ORAL | Status: AC
  Administered 2020-03-14: 30 g via ORAL
  Filled 2020-03-14: qty 120

## 2020-03-14 MED ORDER — GLUCAGON HCL RDNA (DIAGNOSTIC) 1 MG IJ SOLR
INTRAMUSCULAR | Status: AC
  Filled 2020-03-14: qty 1

## 2020-03-14 NOTE — Progress Notes (Signed)
SSE admin. Small results noted.

## 2020-03-14 NOTE — Progress Notes (Addendum)
[facility] PHYSICAL MEDICINE & REHABILITATION PROGRESS NOTE   Subjective/Complaints: Pt feeling much better this morning. Wants something to eat. Another bm later last night. No belly pain  ROS: Patient denies fever, rash, sore throat, blurred vision, nausea, vomiting, diarrhea, cough, shortness of breath or chest pain,   headache, or mood change.     Objective:   CT ABDOMEN PELVIS WO CONTRAST  Result Date: 03/13/2020 CLINICAL DATA:  Nausea and vomiting, ileus EXAM: CT ABDOMEN AND PELVIS WITHOUT CONTRAST TECHNIQUE: Multidetector CT imaging of the abdomen and pelvis was performed following the standard protocol without IV contrast. COMPARISON:  03/13/2020 FINDINGS: Lower chest: Minimal hypoventilatory changes within the lower lobes. No acute pleural or parenchymal lung disease. Hepatobiliary: No focal liver abnormality is seen. No gallstones, gallbladder wall thickening, or biliary dilatation. Pancreas: Unremarkable. No pancreatic ductal dilatation or surrounding inflammatory changes. Spleen: Normal in size without focal abnormality. Adrenals/Urinary Tract: Adrenals are unremarkable. No urinary tract calculi or obstructive uropathy. Bladder is unremarkable. Stomach/Bowel: The colon is moderately distended with gas and stool. No bowel wall thickening or inflammatory change. Normal appendix right lower quadrant. No evidence of small-bowel obstruction. Vascular/Lymphatic: Aortic atherosclerosis. No enlarged abdominal or pelvic lymph nodes. Reproductive: Prostate is unremarkable. Likely undescended testicle within the distal aspect of the right inguinal canal, unchanged since prior exam. Please correlate with physical exam findings. Other: Trace free fluid within the hepatorenal fossa. No free intraperitoneal gas. Musculoskeletal: No acute or destructive bony lesions. Reconstructed images demonstrate no additional findings. IMPRESSION: 1. Moderate colonic ileus. No evidence of small-bowel obstruction. 2.  Trace free fluid within the hepatorenal fossa. 3. Stable undescended right testicle within the distal aspect of the right inguinal canal. 4. Aortic Atherosclerosis (ICD10-I70.0). Electronically Signed   By: Randa Ngo M.D.   On: 03/13/2020 22:47   DG Abd 1 View  Result Date: 03/14/2020 CLINICAL DATA:  Follow-up ileus. EXAM: ABDOMEN - 1 VIEW COMPARISON:  March 13, 2020 FINDINGS: The patient has known colonic ileus remains, similar in the interval. The most marked dilatation is in the cecum. No other interval changes. IMPRESSION: Persistent colonic ileus. The most significant dilatation remains in the cecum. Electronically Signed   By: Dorise Bullion III M.D   On: 03/14/2020 08:34   DG Abd 1 View  Result Date: 03/13/2020 CLINICAL DATA:  Evaluate for ileus. EXAM: ABDOMEN - 1 VIEW COMPARISON:  None. FINDINGS: Numerous dilated air-filled loops of bowel are seen throughout the abdomen and pelvis. This appears to represent predominantly large bowel, with dilated small bowel loops seen overlying the mid left abdomen. A mild-to-moderate amount of stool is seen within the distal sigmoid colon. No radio-opaque calculi or other significant radiographic abnormality are seen. IMPRESSION: 1. Findings consistent with ileus versus partial small bowel obstruction. Correlation with abdomen pelvis CT is recommended. Electronically Signed   By: Virgina Norfolk M.D.   On: 03/13/2020 18:07   Recent Labs    03/13/20 0512 03/14/20 0447  WBC 19.0* 21.4*  HGB 13.4 13.0  HCT 41.4 40.3  PLT 243 246   Recent Labs    03/13/20 0512 03/14/20 0447  NA 133* 133*  K 5.0 5.7*  CL 100 97*  CO2 22 27  GLUCOSE 103* 127*  BUN 51* 35*  CREATININE 0.94 1.00  CALCIUM 9.0 8.7*    Intake/Output Summary (Last 24 hours) at 03/14/2020 0906 Last data filed at 03/14/2020 0245 Gross per 24 hour  Intake 503.25 ml  Output 350 ml  Net 153.25 ml  Physical Exam: Vital Signs Blood pressure (!) 159/71, pulse 66,  temperature 98.3 F (36.8 C), resp. rate 18, height 6\' 3"  (1.905 m), weight 105.6 kg, SpO2 100 %.  Constitutional: No distress . Vital signs reviewed. HEENT: EOMI, oral membranes moist Neck: supple Cardiovascular: RRR without murmur. No JVD    Respiratory/Chest: CTA Bilaterally without wheezes or rales. Normal effort    GI/Abdomen: BS +, still distended, less tender Ext: no clubbing, cyanosis, tr edema Psych: pleasant and cooperative Neuro:  Comments: UEs- biceps 5-/5, WE 4+/5, triceps 4-/5, grip 4-/5, finger abd 3+/5 B/L LEs- R HF 4+/5; L HF 4/5- otherwise 4+/5 in KE, DF, PF and EHL B/L   Ox3 Decreased sensation to light touch in L1 to S2 B/L- otherwise intact in UEs and torso B/L MAS of 1 in LUE and LEs- MAS of 0 in RUE--no changes Skin:intact  Assessment/Plan: 1. 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  Physiatrist is providing close team supervision and 24 hour management of active medical problems listed below.  Physiatrist and rehab team continue to assess barriers to discharge/monitor patient progress toward functional and medical goals  Care Tool:  Bathing    Body parts bathed by patient: Right arm, Left arm, Chest, Abdomen, Front perineal area, Face   Body parts bathed by helper: Buttocks, Right upper leg, Left upper leg, Right lower leg, Left lower leg     Bathing assist Assist Level: Minimal Assistance - Patient > 75%     Upper Body Dressing/Undressing Upper body dressing   What is the patient wearing?: Hospital gown only    Upper body assist Assist Level: Minimal Assistance - Patient > 75%    Lower Body Dressing/Undressing Lower body dressing      What is the patient wearing?: Incontinence brief     Lower body assist Assist for lower body dressing: Maximal Assistance -  Patient 25 - 49%     Toileting Toileting Toileting Activity did not occur (Clothing management and hygiene only): N/A (no void or bm)  Toileting assist Assist for toileting: Maximal Assistance - Patient 25 - 49%     Transfers Chair/bed transfer  Transfers assist     Chair/bed transfer assist level: Moderate Assistance - Patient 50 - 74%     Locomotion Ambulation   Ambulation assist      Assist level: Maximal Assistance - Patient 25 - 49% Assistive device: Walker-rolling Max distance: 5   Walk 10 feet activity   Assist  Walk 10 feet activity did not occur: Safety/medical concerns        Walk 50 feet activity   Assist Walk 50 feet with 2 turns activity did not occur: Safety/medical concerns         Walk 150 feet activity   Assist Walk 150 feet activity did not occur: Safety/medical concerns         Walk 10 feet on uneven surface  activity   Assist Walk 10 feet on uneven surfaces activity did not occur: Safety/medical concerns         Wheelchair     Assist Will patient use wheelchair at discharge?: Yes Type of Wheelchair: Manual    Wheelchair assist level: Total Assistance - Patient < 25% Max wheelchair distance: 10    Wheelchair 50 feet with 2 turns activity    Assist  Assist Level: Maximal Assistance - Patient 25 - 49%   Wheelchair 150 feet activity     Assist      Assist Level: Total Assistance - Patient < 25%   Blood pressure (!) 159/71, pulse 66, temperature 98.3 F (36.8 C), resp. rate 18, height 6\' 3"  (1.905 m), weight 105.6 kg, SpO2 100 %.  Medical Problem List and Plan: 1.Cervical myelopathysecondary to cervical spinal cord compression status post cervical laminectomy and medialfacetectomyC3-4 4-5 posterior cervical arthrodesis C3-C6. Cervical collar at all times? 8/20- pt was given soft collar- refuses to wear it -patient may Shower with covering  incision -ELOS/Goals: min A to Mod I, 3 weeks 2. Antithrombotics: -DVT/anticoagulation: .  vascular study ok  8/21 lovenox  -antiplatelet therapy: N/A 3. Pain Management:Neurontin 300 mg twice daily,baclofen 5 mg 3 times daily,Cymbalta 60 mg daily, oxycodone as needed   8/22 holding scheduled gabapentin and baclofen given ileus 4. Mood:Provide emotional support -antipsychotic agents: N/A 5. Neuropsych: This patientiscapable of making decisions on hisown behalf. 6. Skin/Wound Care:Routine skin checks 7. Fluids/Electrolytes/Nutrition:Routine in and outs with follow-up chemistries 8. Hypertension. Hydralazine 25 mg twice daily, labetalol 100 mg twice daily, lisinopril 40 mg daily. Monitor with increased mobility 9. Diabetes mellitus with peripheral neuropathy. Hemoglobin A1c 7.1. NovoLog 12units 3 times daily with meals, Lantus insulin 55units daily. Check blood sugars before meals and at bedtime. Diabetic teaching CBG (last 3)  Recent Labs    03/13/20 1637 03/13/20 2135 03/14/20 0610  GLUCAP 138* 168* 130*    8/20-22- BG's have been very labile-    -po intake inconsistent also   -reducing lantus given ileus, npo, etc   -adjusted mealtime novolog based on how much he eats    -call DM coordinator Monday 10. CKD stage II. Creatinine baseline 1.46. Follow-up chemistries  8/20- Baseline is 1.4-   8/22 Cr 1 today, holding lasix for now  -kayexalate for hyperkalemia 11. Morbid obesity. BMI 30.09. Dietary follow-up 12. History of alcohol tobacco cocaine use. Providing counseling 13. Diastolic congestive heart failure. Lasix 40 mg daily. Monitor with increased mobility   Filed Weights   03/12/20 0500 03/13/20 0459 03/14/20 0500  Weight: 108.7 kg 105 kg 105.6 kg    8/22 weights down 14. Hyperlipidemia. Lipitor 15. Neurogenic bowel/ileus and bladder- goes every 1 week - which is likely the slowing of neurogenic bowel -  need to improve speed; also has urinary urgency, which is also neurogenic- will monitor and treat as required.   8/20- gave Sorbitol to get pt to have BM today- has been 7-8 days- usually goes 1x/week  8/22 colonic ileus on xray and CT. No SBO   -radiologist read today's kub as unchanged. I see definite improvement and patient feels better   -will begin clear liquid diet only   -continue iv reglan, ivf   -recheck kub tomorrow 16. Spasticity- on Baclofen 5mg  TID- adequately controlled- mainly annoying, not painful- will monitor muscle spasms. 17. Hyperkalemia  8/20- Not on any K+ replacement- K+ is 5.1-max level of normal- will monitor closely.    8/22 kayexalate today 18. Leukocytosis-   8/20- on Decadron slow taper- WBC 20k- will check U/A and Cx just in case- afebrile- but will monitor for sx's of infection.    8/21-22 ua +, ucx pending--> await results   -suspect ileus playing a role too LOS: 3 days A FACE TO FACE EVALUATION WAS PERFORMED  Meredith Staggers 03/14/2020, 9:06 AM

## 2020-03-14 NOTE — Progress Notes (Signed)
Physical Therapy Session Note  Patient Details  Name: [name] MRN: [id number] Date of Birth: March 13, 1961  Today's Date: 03/14/2020 PT Individual Time: 1429-1440 PT Individual Time Calculation (min): 11 min   Short Term Goals: Week 1:  PT Short Term Goal 1 (Week 1): pt to demonstate min A x1 bed mobility PT Short Term Goal 2 (Week 1): pt to demonstrate min A x1 functional transfers with LRAD PT Short Term Goal 3 (Week 1): pt to demonstrate 48' with LRAD mod A x1 PT Short Term Goal 4 (Week 1): pt to demonstrate dynamic sitting balance for functional tasks at min A PT Short Term Goal 4 - Progress (Week 1): Not met  Skilled Therapeutic Interventions/Progress Updates:  Pt was seen bedside in the pm, with nurse at bedside. Pt c/o abd pain but willing to try therapy. Pt transferred supine to edge of bed with mod A and verbal cues. Pt tolerated edge of bed about 60 seconds with S. Pt had to returned to supine with mod A due to abd pain. Pt moved up in bed with min A and verbal cues. Pt rolled into R side lying with S. Pt stating I just cant do anymore. Pt left with call bell within reach.   Therapy Documentation Precautions:  Precautions Precautions: Cervical Precaution Booklet Issued: No Required Braces or Orthoses: Cervical Brace Cervical Brace: Soft collar Restrictions Weight Bearing Restrictions: No General: PT Amount of Missed Time (min): 19 Minutes PT Missed Treatment Reason: Patient fatigue;Pain  Pt c/o 7/10 abd pain.   Therapy/Group: Individual Therapy  [name] 03/14/2020, 3:25 PM

## 2020-03-14 NOTE — Progress Notes (Signed)
Physical Therapy Session Note  Patient Details  Name: [name] MRN: [id number] Date of Birth: 1961/06/04  Today's Date: 03/14/2020 PT Individual Time: 1100-1130 PT Individual Time Calculation (min): 30 min   Short Term Goals: Week 1:  PT Short Term Goal 1 (Week 1): pt to demonstate min A x1 bed mobility PT Short Term Goal 2 (Week 1): pt to demonstrate min A x1 functional transfers with LRAD PT Short Term Goal 3 (Week 1): pt to demonstrate 78' with LRAD mod A x1 PT Short Term Goal 4 (Week 1): pt to demonstrate dynamic sitting balance for functional tasks at min A PT Short Term Goal 4 - Progress (Week 1): Not met  Skilled Therapeutic Interventions/Progress Updates:  Pt was seen bedside in the am. Pt's nurse at bedside. Pt received am medicines. Pt transferred supine to edge of bed with mod A and verbal cues. Pt able to maintain sitting balance with close supervision about 5 minutes. Pt requested to lay back down secondary to cramps/abdominal pain. Pt transferred edge of bed to supine with mod A and verbal cues. Pt moved up in bed with min A. Pt performed LE exercises, heel slides, hip abd/add and SAQs, 2 sets x 10 reps each. Pt rolled R/L with side rail via log rolling and S with verbal cues. Pt requested treatment end secondary to cramps and abdominal pain. Pt's nurse on break notified covering nurse. Pt left sitting up in bed with bed alarm on and all needs within reach.   Therapy Documentation Precautions:  Precautions Precautions: Cervical Precaution Booklet Issued: No Required Braces or Orthoses: Cervical Brace Cervical Brace: Soft collar Restrictions Weight Bearing Restrictions: No General: PT Amount of Missed Time (min): 30 Minutes PT Missed Treatment Reason: Patient fatigue;Pain Vital Signs:  Pain: Pt c/o 7/10 neck pain and abdominal cramps/gas during treatment.  Therapy/Group: Individual Therapy  [name] 03/14/2020, 11:37 AM

## 2020-03-14 NOTE — Progress Notes (Signed)
Occupational Therapy Session Note  Patient Details  Name: [name] MRN: [id number] Date of Birth: 03/13/1961  Today's Date: 03/14/2020 OT Individual Time: 2376-2831 and 5176-1607 OT Individual Time Calculation (min): 56 min and 43 min  Short Term Goals: Week 1:  OT Short Term Goal 1 (Week 1): Patient will don UB clothing with set-up assist. OT Short Term Goal 2 (Week 1): Patient will thread BLE through LB clothing with AE PRN. OT Short Term Goal 3 (Week 1): Patient will complete toilet transfers with Mod A. OT Short Term Goal 4 (Week 1): Patient will complete LB bathing will complete 1/3 parts of toileting.  Skilled Therapeutic Interventions/Progress Updates:    Pt greeted in bed, c/o cervical pain but he did not want any medicine from RN to address. Per pt, "I've been taking too much." Since the cervical collar was wet in the sink, could not don his brace for increased support during tx. OT laid it out in the sun to hasten drying process. Pt wanted to engage in bathing/dressing tasks during session. Supine<sit completed with Mod-Max A and vcs. While EOB pt exhibited Lt lean with Lt trunk elongation, often used unilateral support on mattress to maintain sitting balance. With increased time pt with increased trunk flexion due to pain when lifting head up. Pt required vcs to improve neutral upright alignment and also cues for sequencing when washing UB. Max A for donning overhead shirt. He initiated taking a semi reclined rest break in bed due to pain. Able to utilize reclined figure 4 to wash the Rt LE but needed assistance with the Lt LE. Max A for returning to EOB once again to wash thighs with pt needing to lie back down shortly after due to increased cervical pain. Therefore LB dressing tasks were completed bedlevel, pt utilizing rolling Rt>Lt with Min A and also bridges. He required Max A overall due to pain. Pt boosted himself up several times with HOB flat and also with bed placed in  trendelenburg position. Pt remained in bed with all needs within reach and bed alarm set. Tx focus placed on ADL retraining and activity tolerance.   2nd Session 1:1 tx (43 min) Pt greeted in bed, still reporting cervical pain but declining medicine from RN to address. Soft c-collar was still damp so did not use it for transfer to [facility]. With encouragement, pt agreeable to attempt toileting. Mod A x2 for sit<stand in [place], 1 helper assisted with movement of IV pole while 2nd person steered the lift towards BSC. After pt sat on [facility], he required Max A of 1 for partial stand to reposition himself. Education provided on potty squat positioning and rocking technique to increase ease of elimination. He then had a continent BM. Sit<stand in [place] with Mod Ax2 and vcs for hand placement. Total A for hygiene completion and LB dressing tasks. +2 for using Stedy to transfer back to bed. Pt returned to bed with Mod A and boosted himself up using headboard with bed placed in trendelenburg position. Left him with all needs within reach and bed alarm set. Tx focus placed on OOB tolerance, functional transfers, supported standing, and ADL retraining.   Therapy Documentation Precautions:  Precautions Precautions: Cervical Precaution Booklet Issued: No Required Braces or Orthoses: Cervical Brace Cervical Brace: Soft collar Restrictions Weight Bearing Restrictions: No General: General PT Missed Treatment Reason: Patient fatigue;Pain ADL: ADL Eating: Set up Where Assessed-Eating: Bed level Grooming: Minimal assistance Where Assessed-Grooming: Sitting at sink Upper Body Bathing: Minimal  assistance Where Assessed-Upper Body Bathing: Sitting at sink Lower Body Bathing: Maximal assistance Where Assessed-Lower Body Bathing: Sitting at sink Upper Body Dressing: Moderate assistance Where Assessed-Upper Body Dressing: Sitting at sink Lower Body Dressing: Maximal assistance Where Assessed-Lower Body Dressing:  Sitting at sink      Therapy/Group: Individual Therapy  Carols Clemence A Zalaya Astarita 03/14/2020, 12:41 PM

## 2020-03-15 ENCOUNTER — Inpatient Hospital Stay (HOSPITAL_COMMUNITY): Payer: Medicare Other | Admitting: Occupational Therapy

## 2020-03-15 ENCOUNTER — Inpatient Hospital Stay (HOSPITAL_COMMUNITY): Payer: Medicare Other | Admitting: Physical Therapy

## 2020-03-15 ENCOUNTER — Ambulatory Visit (HOSPITAL_COMMUNITY): Payer: Medicare Other | Attending: Physician Assistant

## 2020-03-15 ENCOUNTER — Inpatient Hospital Stay (HOSPITAL_COMMUNITY): Payer: Medicare Other

## 2020-03-15 DIAGNOSIS — R609 Edema, unspecified: Secondary | ICD-10-CM | POA: Diagnosis not present

## 2020-03-15 DIAGNOSIS — R600 Localized edema: Secondary | ICD-10-CM | POA: Insufficient documentation

## 2020-03-15 LAB — CBC WITH DIFFERENTIAL/PLATELET
Abs Immature Granulocytes: 0.16 10*3/uL — ABNORMAL HIGH (ref 0.00–0.07)
Basophils Absolute: 0 10*3/uL (ref 0.0–0.1)
Basophils Relative: 0 %
Eosinophils Absolute: 0 10*3/uL (ref 0.0–0.5)
Eosinophils Relative: 0 %
HCT: 36.5 % — ABNORMAL LOW (ref 39.0–52.0)
Hemoglobin: 11.9 g/dL — ABNORMAL LOW (ref 13.0–17.0)
Immature Granulocytes: 1 %
Lymphocytes Relative: 7 %
Lymphs Abs: 1.2 10*3/uL (ref 0.7–4.0)
MCH: 27.2 pg (ref 26.0–34.0)
MCHC: 32.6 g/dL (ref 30.0–36.0)
MCV: 83.5 fL (ref 80.0–100.0)
Monocytes Absolute: 1 10*3/uL (ref 0.1–1.0)
Monocytes Relative: 6 %
Neutro Abs: 14.2 10*3/uL — ABNORMAL HIGH (ref 1.7–7.7)
Neutrophils Relative %: 86 %
Platelets: 222 10*3/uL (ref 150–400)
RBC: 4.37 MIL/uL (ref 4.22–5.81)
RDW: 13.6 % (ref 11.5–15.5)
WBC: 16.6 10*3/uL — ABNORMAL HIGH (ref 4.0–10.5)
nRBC: 0 % (ref 0.0–0.2)

## 2020-03-15 LAB — URINE CULTURE: Culture: >=100000 — AB

## 2020-03-15 LAB — GLUCOSE, CAPILLARY
Glucose-Capillary: 31 mg/dL — CL (ref 70–99)
Glucose-Capillary: 257 mg/dL — ABNORMAL HIGH (ref 70–99)
Glucose-Capillary: 442 mg/dL — ABNORMAL HIGH (ref 70–99)
Glucose-Capillary: 102 mg/dL — ABNORMAL HIGH (ref 70–99)
Glucose-Capillary: 39 mg/dL — CL (ref 70–99)
Glucose-Capillary: 68 mg/dL — ABNORMAL LOW (ref 70–99)
Glucose-Capillary: 95 mg/dL (ref 70–99)

## 2020-03-15 LAB — BASIC METABOLIC PANEL
Anion gap: 9 (ref 5–15)
BUN: 31 mg/dL — ABNORMAL HIGH (ref 8–23)
CO2: 25 mmol/L (ref 22–32)
Calcium: 8.5 mg/dL — ABNORMAL LOW (ref 8.9–10.3)
Chloride: 98 mmol/L (ref 98–111)
Creatinine, Ser: 1.07 mg/dL (ref 0.61–1.24)
GFR calc Af Amer: >60 mL/min (ref >60)
GFR calc non Af Amer: >60 mL/min (ref >60)
Glucose, Bld: 143 mg/dL — ABNORMAL HIGH (ref 70–99)
Potassium: 5.1 mmol/L (ref 3.5–5.1)
Sodium: 132 mmol/L — ABNORMAL LOW (ref 135–145)

## 2020-03-15 MED ORDER — SORBITOL 70 % SOLN
30.0000 mL | Freq: Once | Status: AC
  Administered 2020-03-15: 30 mL via ORAL
  Filled 2020-03-15: qty 30

## 2020-03-15 MED ORDER — METOCLOPRAMIDE HCL 5 MG PO TABS
10.0000 mg | ORAL_TABLET | Freq: Three times a day (TID) | ORAL | Status: AC
  Administered 2020-03-15 – 2020-03-17 (×8): 10 mg via ORAL
  Filled 2020-03-15 (×8): qty 2

## 2020-03-15 MED ORDER — INSULIN ASPART 100 UNIT/ML ~~LOC~~ SOLN
6.0000 [IU] | Freq: Three times a day (TID) | SUBCUTANEOUS | Status: DC
  Administered 2020-03-16 – 2020-03-26 (×30): 6 [IU] via SUBCUTANEOUS

## 2020-03-15 MED ORDER — INSULIN ASPART 100 UNIT/ML ~~LOC~~ SOLN
0.0000 [IU] | SUBCUTANEOUS | Status: DC
  Administered 2020-03-16 – 2020-03-17 (×3): 1 [IU] via SUBCUTANEOUS
  Administered 2020-03-17: 9 [IU] via SUBCUTANEOUS
  Administered 2020-03-18: 1 [IU] via SUBCUTANEOUS
  Administered 2020-03-18 (×2): 2 [IU] via SUBCUTANEOUS

## 2020-03-15 NOTE — Progress Notes (Signed)
Occupational Therapy Session Note  Patient Details  Name: [name] MRN: [id number] Date of Birth: 08-31-1960  Today's Date: 03/15/2020 OT Individual Time: 1100-1158 OT Individual Time Calculation (min): 58 min   Short Term Goals: Week 1:  OT Short Term Goal 1 (Week 1): Patient will don UB clothing with set-up assist. OT Short Term Goal 2 (Week 1): Patient will thread BLE through LB clothing with AE PRN. OT Short Term Goal 3 (Week 1): Patient will complete toilet transfers with Mod A. OT Short Term Goal 4 (Week 1): Patient will complete LB bathing will complete 1/3 parts of toileting.  Skilled Therapeutic Interventions/Progress Updates:    Pt greeted while semi perched on [place], RN and NT present. They both relayed to OT that pt slid forward in both the w/c and recliner this AM, needing nursing assistance to intervene to prevent fall. OT provided him with a TIS, changed safety plan stating pt to be left alone in room only when OOB in TIS for safety. Pt requested to go outside during session, escorted him via w/c. Worked on UB strengthening by self propelling over uneven terrain and up/down slight inclines with vcs for propulsion technique. He needed a few rest breaks. Also worked on core strengthening via anterior reaches to target outside of base of support. Pt once again needing rest breaks due to fatigue. Pt required cues frequently for scooting hips back in w/c due to sliding forward, able to correct with vcs but not without them. When he returned to the unit pt completed Stedy transfer to the TIS, Max A for sit<stand from low surface, Mod A for sit<stand from elevated Stedy paddles. Pt remained reclined in TIS with safety belt fastened and all needs within reach.   Therapy Documentation Precautions:  Precautions Precautions: Cervical Precaution Booklet Issued: No Required Braces or Orthoses: Cervical Brace Cervical Brace: Soft collar Restrictions Weight Bearing Restrictions:  No Pain: min c/o cervical pain, pt refused to wear the soft collar, stating it was "uncomfortable" and increased resting pain   ADL: ADL Eating: Set up Where Assessed-Eating: Bed level Grooming: Minimal assistance Where Assessed-Grooming: Sitting at sink Upper Body Bathing: Minimal assistance Where Assessed-Upper Body Bathing: Sitting at sink Lower Body Bathing: Maximal assistance Where Assessed-Lower Body Bathing: Sitting at sink Upper Body Dressing: Moderate assistance Where Assessed-Upper Body Dressing: Sitting at sink Lower Body Dressing: Maximal assistance Where Assessed-Lower Body Dressing: Sitting at sink      Therapy/Group: Individual Therapy  Angelyn Osterberg A Maclovia Uher 03/15/2020, 12:24 PM

## 2020-03-15 NOTE — Care Management (Signed)
Inpatient Rehabilitation Center Individual Statement of Services  Patient Name:  [name]  Date:  03/15/2020  Welcome to the [facility].  Our goal is to provide you with an individualized program based on your diagnosis and situation, designed to meet your specific needs.  With this comprehensive rehabilitation program, you will be expected to participate in at least 3 hours of rehabilitation therapies Monday-Friday, with modified therapy programming on the weekends.  Your rehabilitation program will include the following services:  Physical Therapy (PT), Occupational Therapy (OT), 24 hour per day rehabilitation nursing, Therapeutic Recreaction (TR), Psychology, Neuropsychology, Care Coordinator, Rehabilitation Medicine, Nutrition Services, Pharmacy Services and Other  Weekly team conferences will be held on Tuesdays to discuss your progress.  Your Inpatient Rehabilitation Care Coordinator will talk with you frequently to get your input and to update you on team discussions.  Team conferences with you and your family in attendance may also be held.  Expected length of stay: 3-4 weeks    Overall anticipated outcome: Contact Guard to Supervision  Depending on your progress and recovery, your program may change. Your Inpatient Rehabilitation Care Coordinator will coordinate services and will keep you informed of any changes. Your Inpatient Rehabilitation Care Coordinator's name and contact numbers are listed  below.  The following services may also be recommended but are not provided by the [facility] will be made to provide these services after discharge if needed.  Arrangements include referral to agencies that provide these services.  Your insurance has been verified to be:  Medicare A/B  Your primary  doctor is:  Karle Plumber  Pertinent information will be shared with your doctor and your insurance company.  Inpatient Rehabilitation Care Coordinator:  Cathleen Corti [phone] or ([phone]  Information discussed with and copy given to patient by: Rana Snare, 03/15/2020, 11:03 AM

## 2020-03-15 NOTE — Progress Notes (Signed)
Hypoglycemic Event  CBG: 38 rechecked 39.  Treatment: 8 oz of apple juice given.   Symptoms: no symptoms noted  Follow-up CBG: Time:2137 CBG Result: 68 Another 8 oz of apple juice given. CBG then 95  Possible Reasons for Event: unknown  Comments/MD notified: protocol followed and pt has had recent hypoglycemia.     [name]

## 2020-03-15 NOTE — Progress Notes (Signed)
Loss of IV access. IV team consulted but patient refuses to allow IV insertion.

## 2020-03-15 NOTE — Progress Notes (Signed)
Occupational Therapy Session Note  Patient Details  Name: [name] MRN: [id number] Date of Birth: 1961/02/05  Today's Date: 03/15/2020 OT Individual Time: 1300-1330 OT Individual Time Calculation (min): 30 min    Short Term Goals: Week 1:  OT Short Term Goal 1 (Week 1): Patient will don UB clothing with set-up assist. OT Short Term Goal 2 (Week 1): Patient will thread BLE through LB clothing with AE PRN. OT Short Term Goal 3 (Week 1): Patient will complete toilet transfers with Mod A. OT Short Term Goal 4 (Week 1): Patient will complete LB bathing will complete 1/3 parts of toileting.  Skilled Therapeutic Interventions/Progress Updates:    Patient in bed, states that he just got into bed due to fatigue and will not get out at this time but is willing to complete bed level activities.  Completed bilateral UE AROM activities with focus on scapular and shoulder mobility and endurance.  Completed rolling activities in bed with focus on trunk control and safe technique.  Completed bridging activity.  He remained in bed at close of session, bed alarm set and call bell in hand.    Therapy Documentation Precautions:  Precautions Precautions: Cervical Precaution Booklet Issued: No Required Braces or Orthoses: Cervical Brace Cervical Brace: Soft collar Restrictions Weight Bearing Restrictions: No   Therapy/Group: Individual Therapy  Carlos Levering 03/15/2020, 7:53 AM

## 2020-03-15 NOTE — Progress Notes (Signed)
Physical Therapy Session Note  Patient Details  Name: [name] MRN: [id number] Date of Birth: March 22, 1961  Today's Date: 03/15/2020 PT Individual Time: 6314-9702 PT Individual Time Calculation (min): 40 min   Short Term Goals: Week 1:  PT Short Term Goal 1 (Week 1): pt to demonstate min A x1 bed mobility PT Short Term Goal 2 (Week 1): pt to demonstrate min A x1 functional transfers with LRAD PT Short Term Goal 3 (Week 1): pt to demonstrate 52' with LRAD mod A x1 PT Short Term Goal 4 (Week 1): pt to demonstrate dynamic sitting balance for functional tasks at min A PT Short Term Goal 4 - Progress (Week 1): Not met  Skilled Therapeutic Interventions/Progress Updates:    pt received in bed and agreeable to therapy. Pt directed in supine>sitx2 and sit>supine as once in sitting EOB at [facility], pt reported he did not do this correctly and requested to do it again, mod A supine>sit and min A on second rep of supine>sit, CGA for sitting EOB, STS from elevated bed mod A and use of bedrail, stand pivot to WC on pt's L, mod A with VC for technique and stepping to WC. Pt taken to gym total A for time management, x5 STS from [facility] in parallel bars at mod A improving to min A with VC for hand placement and BLE placement, pt required rest breaks between each rep 2/2 fatigue. Pt requested to return to bed at this time 2/2 fatigue and denied to attempt STS from hall rail. Pt taken to room total A in WC, directed in stand pivot transfer to pt's L, mod A to complete and VC for technique, sit>supine min A for BLE management to fully have legs on bed. Pt denied there ex in supine 2/2 fatigue and reported he needed a break and wanted to rest. Pt left in bed, alarm set, All needs in reach and in good condition. Call light in hand.    Therapy Documentation Precautions:  Precautions Precautions: Cervical Precaution Booklet Issued: No Required Braces or Orthoses: Cervical Brace Cervical Brace: Soft  collar Restrictions Weight Bearing Restrictions: No    Therapy/Group: Individual Therapy  [name] 03/15/2020, 3:39 PM

## 2020-03-15 NOTE — Progress Notes (Signed)
Physical Therapy Session Note  Patient Details  Name: [name] MRN: [id number] Date of Birth: 06-Apr-1961  Today's Date: 03/15/2020 PT Individual Time: 0900-1000 PT Individual Time Calculation (min): 60 min   Short Term Goals: Week 1:  PT Short Term Goal 1 (Week 1): pt to demonstate min A x1 bed mobility PT Short Term Goal 2 (Week 1): pt to demonstrate min A x1 functional transfers with LRAD PT Short Term Goal 3 (Week 1): pt to demonstrate 48' with LRAD mod A x1 PT Short Term Goal 4 (Week 1): pt to demonstrate dynamic sitting balance for functional tasks at min A PT Short Term Goal 4 - Progress (Week 1): Not met  Skilled Therapeutic Interventions/Progress Updates:    pt received in bed and agreeable to therapy. Pt requested to be assisted in dressing, scrub top and pants retrieved and total assist for donning in supine. Pt found to be saturated with urine in bed, through sheets and reports he was unaware of this, bed noted to be off and unable to be turned back on by PT. Assistance called to bedside, NCT to assist with brief change however unable to complete from supine, pt assisted in rolling to remove brief and clean pt at mod A R and L with use of bedrails, max A supine>sit at EOB. Attempted x3 to assist pt in STS to complete brief change however pt unable to transfer to [facility] on first attept, the on second and third attempt pt unable to clear bed at max A to come to standing again despite steady placement. RN called to bedside, assisted in STS transfer max A x2 to steady, pt able to achieve full standing, seated lowered behind pt for brief change and hygiene. Pt transferred in steady to [facility], able to come to full standing at min A with steady use, min A to sit safely into WC seat. Pt directed in [facility] mobility with forward propulsion for 35' CGA with VC for technique. Pt reported fatigue at this distance and taken to gym total A for time management. Pt directed in Kinetron at 40% for 67mns x3 with  rest breaks 2/2 fatigue. Pt denied pain throughout session and at start and end. Pt directed in seated BLE strengthening exercises 2# 2x10 marching and LAQs with VC for technique. Pt returned to room, total A in [place]  All needs in reach and in good condition. Call light in hand.  Nursing reports he has already requested bed change for pt for working bed and this is in progress.     Therapy Documentation Precautions:  Precautions Precautions: Cervical Precaution Booklet Issued: No Required Braces or Orthoses: Cervical Brace Cervical Brace: Soft collar Restrictions Weight Bearing Restrictions: No     Therapy/Group: Individual Therapy  HJunie Panning8/23/2021, 10:03 AM

## 2020-03-15 NOTE — Progress Notes (Addendum)
[place] PHYSICAL MEDICINE & REHABILITATION PROGRESS NOTE   Subjective/Complaints:  Pt's ileus is resolving- said had 3 BMs yesterday- actually had 1 large BM and 2 small BMs documented.   Still has moderate stool burden on KUB- will d/w PA- will have him poop again and then put back to regular diet.    ROS:  Pt denies SOB, abd pain, CP, N/V/C/D, and vision changes    Objective:   CT ABDOMEN PELVIS WO CONTRAST  Result Date: 03/13/2020 CLINICAL DATA:  Nausea and vomiting, ileus EXAM: CT ABDOMEN AND PELVIS WITHOUT CONTRAST TECHNIQUE: Multidetector CT imaging of the abdomen and pelvis was performed following the standard protocol without IV contrast. COMPARISON:  03/13/2020 FINDINGS: Lower chest: Minimal hypoventilatory changes within the lower lobes. No acute pleural or parenchymal lung disease. Hepatobiliary: No focal liver abnormality is seen. No gallstones, gallbladder wall thickening, or biliary dilatation. Pancreas: Unremarkable. No pancreatic ductal dilatation or surrounding inflammatory changes. Spleen: Normal in size without focal abnormality. Adrenals/Urinary Tract: Adrenals are unremarkable. No urinary tract calculi or obstructive uropathy. Bladder is unremarkable. Stomach/Bowel: The colon is moderately distended with gas and stool. No bowel wall thickening or inflammatory change. Normal appendix right lower quadrant. No evidence of small-bowel obstruction. Vascular/Lymphatic: Aortic atherosclerosis. No enlarged abdominal or pelvic lymph nodes. Reproductive: Prostate is unremarkable. Likely undescended testicle within the distal aspect of the right inguinal canal, unchanged since prior exam. Please correlate with physical exam findings. Other: Trace free fluid within the hepatorenal fossa. No free intraperitoneal gas. Musculoskeletal: No acute or destructive bony lesions. Reconstructed images demonstrate no additional findings. IMPRESSION: 1. Moderate colonic ileus. No evidence of  small-bowel obstruction. 2. Trace free fluid within the hepatorenal fossa. 3. Stable undescended right testicle within the distal aspect of the right inguinal canal. 4. Aortic Atherosclerosis (ICD10-I70.0). Electronically Signed   By: Randa Ngo M.D.   On: 03/13/2020 22:47   DG Abd 1 View  Result Date: 03/15/2020 CLINICAL DATA:  Follow-up ileus EXAM: ABDOMEN - 1 VIEW COMPARISON:  03/14/2020 FINDINGS: Since 03/13/2020 there is been of gradual improvement and colonic distension compatible with resolving ileus. Moderate stool identified within the rectum. IMPRESSION: Improving ileus pattern. Electronically Signed   By: Kerby Moors M.D.   On: 03/15/2020 08:15   DG Abd 1 View  Result Date: 03/14/2020 CLINICAL DATA:  Follow-up ileus. EXAM: ABDOMEN - 1 VIEW COMPARISON:  March 13, 2020 FINDINGS: The patient has known colonic ileus remains, similar in the interval. The most marked dilatation is in the cecum. No other interval changes. IMPRESSION: Persistent colonic ileus. The most significant dilatation remains in the cecum. Electronically Signed   By: Dorise Bullion III M.D   On: 03/14/2020 08:34   DG Abd 1 View  Result Date: 03/13/2020 CLINICAL DATA:  Evaluate for ileus. EXAM: ABDOMEN - 1 VIEW COMPARISON:  None. FINDINGS: Numerous dilated air-filled loops of bowel are seen throughout the abdomen and pelvis. This appears to represent predominantly large bowel, with dilated small bowel loops seen overlying the mid left abdomen. A mild-to-moderate amount of stool is seen within the distal sigmoid colon. No radio-opaque calculi or other significant radiographic abnormality are seen. IMPRESSION: 1. Findings consistent with ileus versus partial small bowel obstruction. Correlation with abdomen pelvis CT is recommended. Electronically Signed   By: Virgina Norfolk M.D.   On: 03/13/2020 18:07   Recent Labs    03/14/20 0447 03/15/20 0443  WBC 21.4* 16.6*  HGB 13.0 11.9*  HCT 40.3 36.5*  PLT 246 222  Recent Labs    03/14/20 0447 03/15/20 0443  NA 133* 132*  K 5.7* 5.1  CL 97* 98  CO2 27 25  GLUCOSE 127* 143*  BUN 35* 31*  CREATININE 1.00 1.07  CALCIUM 8.7* 8.5*    Intake/Output Summary (Last 24 hours) at 03/15/2020 2878 Last data filed at 03/15/2020 0900 Gross per 24 hour  Intake 240 ml  Output 300 ml  Net -60 ml     Physical Exam: Vital Signs Blood pressure (!) 150/52, pulse 78, temperature 98.3 F (36.8 C), resp. rate 18, height 6\' 3"  (1.905 m), weight 106.3 kg, SpO2 98 %.  Constitutional: No distress . Vital signs reviewed- supine in bed- appropriate, NAD. HEENT: EOMI, oral membranes moist Neck: supple Cardiovascular: RRR    Respiratory/Chest: CTA B/L- no W/R/R- good air movement   GI/Abdomen: hypoactive BS; softer, NT, slightly distended vs protuerant Ext: no clubbing, cyanosis, tr edema Psych: pleasant and cooperative- bright Neuro:  Comments: UEs- biceps 5-/5, WE 4+/5, triceps 4-/5, grip 4-/5, finger abd 3+/5 B/L LEs- R HF 4+/5; L HF 4/5- otherwise 4+/5 in KE, DF, PF and EHL B/L   Ox3 Decreased sensation to light touch in L1 to S2 B/L- otherwise intact in UEs and torso B/L MAS of 1 in LUE and LEs- MAS of 0 in RUE--no changes Skin:intact- staples in posterior neck- no erythema- look good  Assessment/Plan: 1. 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  Physiatrist is providing close team supervision and 24 hour management of active medical problems listed below.  Physiatrist and rehab team continue to assess barriers to discharge/monitor patient progress toward functional and medical goals  Care Tool:  Bathing    Body parts bathed by patient: Right arm, Left arm, Chest, Abdomen, Front perineal area, Face, Right upper leg, Left upper leg, Right lower leg   Body parts bathed  by helper: Left lower leg, Buttocks     Bathing assist Assist Level: Minimal Assistance - Patient > 75%     Upper Body Dressing/Undressing Upper body dressing   What is the patient wearing?: Pull over shirt    Upper body assist Assist Level: Maximal Assistance - Patient 25 - 49%    Lower Body Dressing/Undressing Lower body dressing      What is the patient wearing?: Incontinence brief, Pants     Lower body assist Assist for lower body dressing: Maximal Assistance - Patient 25 - 49%     Toileting Toileting Toileting Activity did not occur (Clothing management and hygiene only): Refused  Toileting assist Assist for toileting: 2 Helpers     Transfers Chair/bed transfer  Transfers assist     Chair/bed transfer assist level: Moderate Assistance - Patient 50 - 74%     Locomotion Ambulation   Ambulation assist      Assist level: Maximal Assistance - Patient 25 - 49% Assistive device: Walker-rolling Max distance: 5   Walk 10 feet activity   Assist  Walk 10 feet activity did not occur: Safety/medical concerns        Walk 50 feet activity   Assist Walk 50 feet with 2 turns activity did not occur: Safety/medical concerns         Walk 150 feet activity   Assist Walk 150 feet activity did not occur: Safety/medical concerns         Walk 10 feet  on uneven surface  activity   Assist Walk 10 feet on uneven surfaces activity did not occur: Safety/medical concerns         Wheelchair     Assist Will patient use wheelchair at discharge?: Yes Type of Wheelchair: Manual    Wheelchair assist level: Total Assistance - Patient < 25% Max wheelchair distance: 10    Wheelchair 50 feet with 2 turns activity    Assist        Assist Level: Maximal Assistance - Patient 25 - 49%   Wheelchair 150 feet activity     Assist      Assist Level: Total Assistance - Patient < 25%   Blood pressure (!) 150/52, pulse 78, temperature 98.3 F  (36.8 C), resp. rate 18, height 6\' 3"  (1.905 m), weight 106.3 kg, SpO2 98 %.  Medical Problem List and Plan: 1.Cervical myelopathysecondary to cervical spinal cord compression status post cervical laminectomy and medialfacetectomyC3-4 4-5 posterior cervical arthrodesis C3-C6. Cervical collar at all times? 8/20- pt was given soft collar- refuses to wear it -patient may Shower with covering incision -ELOS/Goals: min A to Mod I, 3 weeks 2. Antithrombotics: -DVT/anticoagulation: .  vascular study ok  8/21 lovenox  -antiplatelet therapy: N/A 3. Pain Management:Neurontin 300 mg twice daily,baclofen 5 mg 3 times daily,Cymbalta 60 mg daily, oxycodone as needed   8/22 holding scheduled gabapentin and baclofen given ileus 4. Mood:Provide emotional support -antipsychotic agents: N/A 5. Neuropsych: This patientiscapable of making decisions on hisown behalf. 6. Skin/Wound Care:Routine skin checks 7. Fluids/Electrolytes/Nutrition:Routine in and outs with follow-up chemistries 8. Hypertension. Hydralazine 25 mg twice daily, labetalol 100 mg twice daily, lisinopril 40 mg daily. Monitor with increased mobility 9. Diabetes mellitus with peripheral neuropathy. Hemoglobin A1c 7.1. NovoLog 12units 3 times daily with meals, Lantus insulin 55units daily. Check blood sugars before meals and at bedtime. Diabetic teaching CBG (last 3)  Recent Labs    03/14/20 1649 03/14/20 2106 03/14/20 2140  GLUCAP 107* 34* 108*    8/20-22- BG's have been very labile-    -po intake inconsistent also   -reducing lantus given ileus, npo, etc   -adjusted mealtime novolog based on how much he eats    -call DM coordinator Monday  8/23- placed DM coordinator's consult 10. CKD stage II. Creatinine baseline 1.46. Follow-up chemistries  8/20- Baseline is 1.4-   8/22 Cr 1 today, holding lasix for now  -kayexalate for hyperkalemia 11. Morbid obesity.  BMI 30.09. Dietary follow-up 12. History of alcohol tobacco cocaine use. Providing counseling 13. Diastolic congestive heart failure. Lasix 40 mg daily. Monitor with increased mobility   Filed Weights   03/13/20 0459 03/14/20 0500 03/15/20 0500  Weight: 105 kg 105.6 kg 106.3 kg    8/22 weights down  8/23- weight up 1 kg- in spite of pooping.  14. Hyperlipidemia. Lipitor 15. Neurogenic bowel/ileus and bladder- goes every 1 week - which is likely the slowing of neurogenic bowel - need to improve speed; also has urinary urgency, which is also neurogenic- will monitor and treat as required.   8/20- gave Sorbitol to get pt to have BM today- has been 7-8 days- usually goes 1x/week  8/22 colonic ileus on xray and CT. No SBO   -radiologist read today's kub as unchanged. I see definite improvement and patient feels better   -will begin clear liquid diet only   -continue iv reglan, ivf   -recheck kub tomorrow  8/23- still has moderate stool burden- will give another dose Sorbitol and then  change back to regular diet if poops. Also change reglan to PO 10 mg TID-AC x 2 days- since lost IV access.  16. Spasticity- on Baclofen 5mg  TID- adequately controlled- mainly annoying, not painful- will monitor muscle spasms. 17. Hyperkalemia  8/20- Not on any K+ replacement- K+ is 5.1-max level of normal- will monitor closely.    8/22 kayexalate today 18. Leukocytosis-   8/20- on Decadron slow taper- WBC 20k-   will check U/A and Cx just in case- afebrile- but will monitor for sx's of infection.    8/21-22 ua +, ucx pending--> await results   -suspect ileus playing a role too  8/23- U/A (+) somewhat- U Cx growing >100k E Coli- WBC down to 16.6k   LOS: 4 days A FACE TO FACE EVALUATION WAS PERFORMED  Axyl Sitzman 03/15/2020, 9:27 AM

## 2020-03-15 NOTE — Progress Notes (Signed)
Lower extremity venous bilateral study completed.   Please see CV Proc for preliminary results.   Davin Muramoto  

## 2020-03-15 NOTE — Progress Notes (Signed)
Blood sugar 442 just before noon. Scheduled insulin and coverage given. PA notified.

## 2020-03-15 NOTE — Progress Notes (Signed)
Inpatient Diabetes Program Recommendations  AACE/ADA: New Consensus Statement on Inpatient Glycemic Control (2015)  Target Ranges:  Prepandial:   less than 140 mg/dL      Peak postprandial:   less than 180 mg/dL (1-2 hours)      Critically ill patients:  140 - 180 mg/dL   Lab Results  Component Value Date   GLUCAP 442 (H) 03/15/2020   HGBA1C 7.1 (H) 02/26/2020    Review of Glycemic Control  Inpatient Diabetes Program Recommendations:   Received consult regarding diabetes management. Noted patient 442 @ 11:52. Patient had not received meal coverage and ate 100% breakfast. Patient received Novolog 32 units total including meal coverage + correction. Spoke with RN Tommie Sams to verify insulin administration.  -Decrease Novolog meal coverage to 6 units tid if eats 50% -Decrease Novolog correction to moderate -Watch for symptoms of hypoglycemia post receiving Novolog 32 units.  Thank you, Nani Gasser. Jafari Mckillop, RN, MSN, CDE  Diabetes Coordinator Inpatient Glycemic Control Team Team Pager [phone] (8am-5pm) 03/15/2020 3:44 PM

## 2020-03-16 ENCOUNTER — Inpatient Hospital Stay (HOSPITAL_COMMUNITY): Payer: Medicare Other | Admitting: Physical Therapy

## 2020-03-16 ENCOUNTER — Inpatient Hospital Stay (HOSPITAL_COMMUNITY): Payer: Medicare Other | Admitting: Occupational Therapy

## 2020-03-16 LAB — GLUCOSE, CAPILLARY
Glucose-Capillary: 107 mg/dL — ABNORMAL HIGH (ref 70–99)
Glucose-Capillary: 108 mg/dL — ABNORMAL HIGH (ref 70–99)
Glucose-Capillary: 142 mg/dL — ABNORMAL HIGH (ref 70–99)
Glucose-Capillary: 148 mg/dL — ABNORMAL HIGH (ref 70–99)
Glucose-Capillary: 169 mg/dL — ABNORMAL HIGH (ref 70–99)
Glucose-Capillary: 38 mg/dL — CL (ref 70–99)
Glucose-Capillary: 61 mg/dL — ABNORMAL LOW (ref 70–99)
Glucose-Capillary: 81 mg/dL (ref 70–99)
Glucose-Capillary: 98 mg/dL (ref 70–99)

## 2020-03-16 MED ORDER — MAGNESIUM CITRATE PO SOLN
0.5000 | Freq: Once | ORAL | Status: AC
  Administered 2020-03-16: 0.5 via ORAL
  Filled 2020-03-16: qty 296

## 2020-03-16 MED ORDER — BACLOFEN 5 MG HALF TABLET
5.0000 mg | ORAL_TABLET | Freq: Three times a day (TID) | ORAL | Status: DC
  Administered 2020-03-16 – 2020-03-26 (×31): 5 mg via ORAL
  Filled 2020-03-16 (×32): qty 1

## 2020-03-16 MED ORDER — SULFAMETHOXAZOLE-TRIMETHOPRIM 800-160 MG PO TABS
1.0000 | ORAL_TABLET | Freq: Two times a day (BID) | ORAL | Status: DC
  Administered 2020-03-16 – 2020-03-22 (×13): 1 via ORAL
  Filled 2020-03-16 (×13): qty 1

## 2020-03-16 NOTE — Progress Notes (Signed)
Patient ID: [name], male   DOB: 07/21/1961, 59 y.o.   MRN: [id number]  SW met with pt in room to provide updates from team conference and on inform on d/c date 03/30/2020.  SW received request from nursing staff that pt friend Shirley would like SW to give updates from team conference. SW called Shirley ([phone]) to discuss above. She reported concerns about his discharge as she is only able to provide intermittent support. She discussed possible placement for pt. SW encouraged her to speak with him further since she is not in a position to provide more than intermittent assistance. SW to discuss further with patient.   Auria Chamberlain, MSW, LCSWA Office: [phone] Cell: [phone] Fax: [phone] 

## 2020-03-16 NOTE — Progress Notes (Signed)
Occupational Therapy Session Note  Patient Details  Name: [name] MRN: [id number] Date of Birth: Mar 07, 1961  Today's Date: 03/16/2020 OT Individual Time: 1009-1103 OT Individual Time Calculation (min): 54 min    Short Term Goals: Week 1:  OT Short Term Goal 1 (Week 1): Patient will don UB clothing with set-up assist. OT Short Term Goal 2 (Week 1): Patient will thread BLE through LB clothing with AE PRN. OT Short Term Goal 3 (Week 1): Patient will complete toilet transfers with Mod A. OT Short Term Goal 4 (Week 1): Patient will complete LB bathing will complete 1/3 parts of toileting.  Skilled Therapeutic Interventions/Progress Updates:   Pt in bed to start session.  He was able to transfer to sitting EOB with overall min assist using the bed rails and the HOB slightly elevated.  Soft cervical collar was donned with max assist secondary to pt not being able to reach behind his neck with BUEs to fasten it.  He was able to complete sit to stand from the elevated EOB with use of the [place] and mod assist.  Once standing, noted slight bowel stains in his brief, so worked on washing his buttocks and changing brief, before transferring to the wheelchair.  Therapist provided total assist for cleaning his buttocks in standing as well as total assist for pulling his brief up over his hips.  He was then transferred over to the tilt in space wheelchair where he was positioned at the sink for completion or oral hygiene.  Setup assist was all that was needed for completion or oral hygiene from seated position.  He was next taken down to the ortho gym where he completed 2 sets of 3 mins each on the UE ergonometer.  He needed max instructional cueing to complete each set secondary to wanting to stop after only 1 min due to fatigue.  RPMs maintained at 14-18 for each set with the first set resistance at level 8 and the second at level 6.  Returned to the room at the end of the session with use of the [facility] for  transfer back to the EOB.  He again was able to complete sit to stand with mod assist in the [place] for the transfer.  Max assist was also needed for doffing his soft cervical collar before transferring to supine with min assist.  Call button and phone in reach with safety alarm in place.     Therapy Documentation Precautions:  Precautions Precautions: Cervical Precaution Booklet Issued: No Required Braces or Orthoses: Cervical Brace Cervical Brace: Soft collar (when up OOB) Restrictions Weight Bearing Restrictions: No Pain: Pain Assessment Pain Scale: Faces Pain Score: 6  Faces Pain Scale: Hurts a little bit Pain Type: Acute pain Pain Location: Shoulder Pain Orientation: Right;Left Pain Descriptors / Indicators: Discomfort Pain Onset: With Activity Pain Intervention(s): Emotional support;Repositioned ADL: See Care Tool Section for some details of mobility and selfcare  Therapy/Group: Individual Therapy  Yannis Broce OTR/L 03/16/2020, 12:37 PM

## 2020-03-16 NOTE — Plan of Care (Signed)
  Problem: Consults Goal: RH GENERAL PATIENT EDUCATION Description: See Patient Education module for education specifics. Outcome: Progressing Goal: Skin Care Protocol Initiated - if Braden Score 18 or less Description: If consults are not indicated, leave blank or document N/A Outcome: Progressing Goal: Nutrition Consult-if indicated Outcome: Progressing Goal: Diabetes Guidelines if Diabetic/Glucose > 140 Description: If diabetic or lab glucose is > 140 mg/dl - Initiate Diabetes/Hyperglycemia Guidelines & Document Interventions  Outcome: Progressing   Problem: RH BLADDER ELIMINATION Goal: RH STG MANAGE BLADDER WITH ASSISTANCE Description: STG Manage Bladder With mod Assistance Outcome: Progressing   Problem: RH SKIN INTEGRITY Goal: RH STG SKIN FREE OF INFECTION/BREAKDOWN Outcome: Progressing Goal: RH STG MAINTAIN SKIN INTEGRITY WITH ASSISTANCE Description: STG Maintain Skin Integrity With mod Assistance. Outcome: Progressing Goal: RH STG ABLE TO PERFORM INCISION/WOUND CARE W/ASSISTANCE Description: STG Able To Perform Incision/Wound Care With max Assistance. Outcome: Progressing   Problem: RH SAFETY Goal: RH STG ADHERE TO SAFETY PRECAUTIONS W/ASSISTANCE/DEVICE Description: STG Adhere to Safety Precautions With min Assistance/Device. Outcome: Progressing Goal: RH STG DECREASED RISK OF FALL WITH ASSISTANCE Description: STG Decreased Risk of Fall With min Assistance. Outcome: Progressing   Problem: RH PAIN MANAGEMENT Goal: RH STG PAIN MANAGED AT OR BELOW PT'S PAIN GOAL Description: Less than 3 out of 10  Outcome: Progressing   Problem: RH KNOWLEDGE DEFICIT GENERAL Goal: RH STG INCREASE KNOWLEDGE OF SELF CARE AFTER HOSPITALIZATION Description: Pt will be able to verbalize self care while at home with min assist   Outcome: Progressing   Problem: RH BOWEL ELIMINATION Goal: RH STG MANAGE BOWEL WITH ASSISTANCE Description: STG Manage Bowel with mod Assistance. Outcome: Not  Progressing Goal: RH STG MANAGE BOWEL W/MEDICATION W/ASSISTANCE Description: STG Manage Bowel with Medication with mod Assistance. Outcome: Not Progressing

## 2020-03-16 NOTE — Progress Notes (Signed)
Physical Therapy Session Note  Patient Details  Name: [name] MRN: [id number] Date of Birth: 04-Jul-1961  Today's Date: 03/16/2020 PT Individual Time: 1333-1445 and 1333-1440 PT Individual Time Calculation (min): 60 min and 72 mins  Short Term Goals: Week 1:  PT Short Term Goal 1 (Week 1): pt to demonstate min A x1 bed mobility PT Short Term Goal 2 (Week 1): pt to demonstrate min A x1 functional transfers with LRAD PT Short Term Goal 3 (Week 1): pt to demonstrate 6' with LRAD mod A x1 PT Short Term Goal 4 (Week 1): pt to demonstrate dynamic sitting balance for functional tasks at min A PT Short Term Goal 4 - Progress (Week 1): Not met  Skilled Therapeutic Interventions/Progress Updates:    session 1: pt received in bed and agreeable to therapy. Pt reported 7/10 posterior neck pain however denied pain medicine and reports "I always have pain". Pt directed in donning pants from supine position at mod A for looping pants onto feet and fully pulling pants on, mod A for donning B socks as well from supine. Pt directed in supine>sit modA with use of bed rail, log rolling technique and VC for safety. Pt required min A for initial balance at EOB but improved to CGA quickly. x3 attempts to come to standing from EOB, ultimately required elevating bed height higher than pt reports his bed at home is, and mod A to come to full standing. Pt directed in step pivot to pt's L mod A to WC with noted shuffled steps and poor foot clearance noted, unable to correct with VC. Pt taken to gym in [facility] total A for time management. Pt directed in x5 STS in parallel bars at mod A initially improved to min A with VC for hand placement to use arm rests of WC to ascend and descend to chair. Pt directed in 5x20 marching in standing in parallel bars at min A for stability and VC for increased hip flexion to improve foot clearance. Pt agreeable to staying in [facility] at end of session and then Pt directed in seated BLE strengthening  ex of marching, LAQ 2# x10 with pt reporting he needed to have a BM, weights removed and pt taken to room in [facility] at total A and requested to return to bed for bedpan use, pt directed in stand pivot to pt's L at mod A with VC for technique and sit>supine at mod A with pt attempting to lay flat from sitting and required mod A to return to upright sitting for safety and directed in log rolling to supine. Once In supine and bed pan retrieved pt reported he did not have to have a BM and brief was clean, pt denied to transfer OOB to [place]. Pt left in bed, bed alarm set, All needs in reach and in good condition. Call light in hand.     Session 2: pt received in bed and agreeable to therapy. pt reported 7/10 pain in posterior neck, denied therapist asking if he would like RN to assist in pain medication. Pt directed in rolling to R and L x1 with use of bed rails, mod A to complete, supine>sit max A x1 with x2 attempts to complete with pt demonstrating difficulty with log rolling technique and required max VC to complete. Pt directed in sitting EOB for several minutes for sock and pant donning at max A, sitting balance min A. Pt directed in STS from EOB required x2 attempts to clear bed, HHA  and extra time required for stand pivot to WC mod A. Pt taken to gym in [facility] total A for time management and directed in [employer] for 1 min intervals and 1 min rest break at 50% for  x6 reps with pt reporting fatigue at end of this and required several minutes rest break. Pt directed In x5 STS with one bar of parallel bar in front of pt and VC for BUE pushing from armrest to come to standing and BUE for sitting inWC for safety, mod A -minA. Pt directed in additional x3 STS in same fashion with lateral stepping R and L x3 steps each min A for support at parallel bar with BUE, pt required sitting rest break after each rep and VC for increased step height. Pt directed in [place] mobility with BLE to propel WC forward for 5-8' at a time then rest  breaks needed, x5 reps at these distances completed. Pt educated on importance of increasing endurance and improving to baseline lengths OOB tolerance and pt agreeable, however at this time requested to return to room to urinate. Pt returned in [facility], given urinal and unable to void. Pt left in WC, alarm belt set, All needs in reach and in good condition. Call light in hand.    Therapy Documentation Precautions:  Precautions Precautions: Cervical Precaution Booklet Issued: No Required Braces or Orthoses: Cervical Brace Cervical Brace: Soft collar (when up OOB) Restrictions Weight Bearing Restrictions: No    Therapy/Group: Individual Therapy  [name] 03/16/2020, 3:33 PM

## 2020-03-16 NOTE — Patient Care Conference (Signed)
Inpatient RehabilitationTeam Conference and Plan of Care Update Date: 03/16/2020   Time: 11:27 AM    Patient Name: [name]      Medical Record Number: [id number]  Date of Birth: 21-Oct-1960 Sex: Male         Room/Bed: 4M09C/4M09C-01 Payor Info: Payor: MEDICARE / Plan: MEDICARE PART A AND B / Product Type: *No Product type* /    Admit Date/Time:  03/11/2020  5:34 PM  Primary Diagnosis:  Cervical myelopathy [facility])  Hospital Problems: Principal Problem:   Cervical myelopathy ([place]) Active Problems:   Hyperlipidemia associated with type 2 diabetes mellitus [facility])    Expected Discharge Date: Expected Discharge Date: 03/30/20  Team Members Present: Physician leading conference: Dr. Courtney Heys Care Coodinator Present: Loralee Pacas, LCSWA;Callyn Severtson Creig Hines, RN, BSN, CRRN Nurse Present: Dwaine Gale, RN PT Present: Excell Seltzer, Katherine Roan, PT OT Present: Willeen Cass, OT SLP Present: Charolett Bumpers, SLP PPS Coordinator present : Ileana Ladd, PT     Current Status/Progress Goal Weekly Team Focus  Bowel/Bladder   Pt continent of B/B. LBM 03/15/2020  Pt to continue to have regular BMs  Assess B/B every shift and PRN   Swallow/Nutrition/ Hydration             ADL's   Min assist for UB bathing with min to mod for dressing.  Max assist for LB selfcare as well as use of the [place] at total assist for stand pivot transfers to the wide 3:1 and wheelchair. BUE weakness overall with decreased FM coordination in the left hand compared to the right.  supervision to contact guard  selfcare retraining, transfer training, balance retraining, DME education, therapeutic exercise, pt education   Mobility   mod A rolling with bed rails; mod A-max A for supine<>sit; transfers mod A-max A; unable to initiate gait training; mod A short distances with manual WC  min A gait training LRAD for 100'; CGA transfers and bed mob  strengthening, endurance, transfers, bed mob, initiate gait  training   Communication             Safety/Cognition/ Behavioral Observations            Pain   Pt denies pain  pain level <3  Assess pain every shift and PRN   Skin   Surgical incision to back of neck. Dressing clean dry and intact  no S/Sx of infection to surgical incision and no new skin breakdown  Assess skin every shift and prn     Discharge Planning:  Pt lives alone, and states that he will have assistance from friend Enid Derry at d/c.   Team Discussion: Continent B/B, Blood sugars not controlled, MD adjusting medications. Max assist for ADL's, Unable to initiate gait training. Mod/max assist transfers, mod assist with short distances in manual w/c. Patient on target to meet rehab goals: no, therapy to continue to work with patient before down grading goals.  *See Care Plan and progress notes for long and short-term goals.   Revisions to Treatment Plan:  Not at this time  Teaching Needs: Continue patient and family education  Current Barriers to Discharge: Decreased caregiver support, Home enviroment access/layout, Incontinence, Neurogenic bowel and bladder, Wound care, Weight and Weight bearing restrictions  Possible Resolutions to Barriers: Patient is being treated for a UTI, continue caregiver education, teach wound care, consider diabetes consult.     Medical Summary Current Status: BM this AM; B/B incontinent issues- incision looks great; BGs 38 o/n;  Barriers to Discharge: Decreased  family/caregiver support;Home enviroment access/layout;Incontinence;Neurogenic Bowel & Bladder;Weight;Weight bearing restrictions;Wound care;Medical stability  Barriers to Discharge Comments: being tx'd for UTI- max A currently- goals CGA; - 03/30/20 d/c     Continued Need for Acute Rehabilitation Level of Care: The patient requires daily medical management by a physician with specialized training in physical medicine and rehabilitation for the following reasons: Direction of a  multidisciplinary physical rehabilitation program to maximize functional independence : Yes Medical management of patient stability for increased activity during participation in an intensive rehabilitation regime.: Yes Analysis of laboratory values and/or radiology reports with any subsequent need for medication adjustment and/or medical intervention. : Yes   I attest that I was present, lead the team conference, and concur with the assessment and plan of the team.   Cristi Loron 03/16/2020, 4:48 PM

## 2020-03-16 NOTE — Progress Notes (Signed)
[facility] PHYSICAL MEDICINE & REHABILITATION PROGRESS NOTE   Subjective/Complaints:  Pt DIDN'T have BM in last 24 hours, even after Sorbitol yesterday.   Did have BG of 38 yesterday afternoon- doesn't have Sx's of hypoglycemia anymore.   Also c/o "bones in L hand are hurting".  But upon further discussion, was more in the muscles- of L hand.    ROS:   Pt denies SOB, abd pain, CP, N/V/C/D, and vision changes   Objective:   DG Abd 1 View  Result Date: 03/15/2020 CLINICAL DATA:  Follow-up ileus EXAM: ABDOMEN - 1 VIEW COMPARISON:  03/14/2020 FINDINGS: Since 03/13/2020 there is been of gradual improvement and colonic distension compatible with resolving ileus. Moderate stool identified within the rectum. IMPRESSION: Improving ileus pattern. Electronically Signed   By: Kerby Moors M.D.   On: 03/15/2020 08:15   VAS [name] LOWER EXTREMITY VENOUS (DVT)  Result Date: 03/15/2020  Lower Venous DVTStudy Indications: Edema.  Risk Factors: Immobility Surgery 03-05-2020 Cervical spine. Comparison Study: Prior LLEV 12-19-2017 WNL Performing Technologist: Darlin Coco  Examination Guidelines: A complete evaluation includes B-mode imaging, spectral Doppler, color Doppler, and power Doppler as needed of all accessible portions of each vessel. Bilateral testing is considered an integral part of a complete examination. Limited examinations for reoccurring indications may be performed as noted. The reflux portion of the exam is performed with the patient in reverse Trendelenburg.  +---------+---------------+---------+-----------+----------+--------------+ RIGHT    CompressibilityPhasicitySpontaneityPropertiesThrombus Aging +---------+---------------+---------+-----------+----------+--------------+ CFV      Full           Yes      Yes                                 +---------+---------------+---------+-----------+----------+--------------+ SFJ      Full                                                         +---------+---------------+---------+-----------+----------+--------------+ FV Prox  Full                                                        +---------+---------------+---------+-----------+----------+--------------+ FV Mid   Full                                                        +---------+---------------+---------+-----------+----------+--------------+ FV DistalFull                                                        +---------+---------------+---------+-----------+----------+--------------+ PFV      Full                                                        +---------+---------------+---------+-----------+----------+--------------+  POP      Full           Yes      Yes                                 +---------+---------------+---------+-----------+----------+--------------+ PTV      Full                                                        +---------+---------------+---------+-----------+----------+--------------+ PERO     Full                                                        +---------+---------------+---------+-----------+----------+--------------+   +---------+---------------+---------+-----------+----------+--------------+ LEFT     CompressibilityPhasicitySpontaneityPropertiesThrombus Aging +---------+---------------+---------+-----------+----------+--------------+ CFV      Full           Yes      Yes                                 +---------+---------------+---------+-----------+----------+--------------+ SFJ      Full                                                        +---------+---------------+---------+-----------+----------+--------------+ FV Prox  Full                                                        +---------+---------------+---------+-----------+----------+--------------+ FV Mid   Full                                                         +---------+---------------+---------+-----------+----------+--------------+ FV DistalFull                                                        +---------+---------------+---------+-----------+----------+--------------+ PFV      Full                                                        +---------+---------------+---------+-----------+----------+--------------+ POP      Full           Yes      Yes                                 +---------+---------------+---------+-----------+----------+--------------+  PTV      Full                                                        +---------+---------------+---------+-----------+----------+--------------+ PERO     Full                                                        +---------+---------------+---------+-----------+----------+--------------+     Summary: RIGHT: - There is no evidence of deep vein thrombosis in the lower extremity.  - No cystic structure found in the popliteal fossa.  LEFT: - There is no evidence of deep vein thrombosis in the lower extremity.  - No cystic structure found in the popliteal fossa.  *See table(s) above for measurements and observations. Electronically signed by Ruta Hinds MD on 03/15/2020 at 5:08:36 PM.    Final    Recent Labs    03/14/20 0447 03/15/20 0443  WBC 21.4* 16.6*  HGB 13.0 11.9*  HCT 40.3 36.5*  PLT 246 222   Recent Labs    03/14/20 0447 03/15/20 0443  NA 133* 132*  K 5.7* 5.1  CL 97* 98  CO2 27 25  GLUCOSE 127* 143*  BUN 35* 31*  CREATININE 1.00 1.07  CALCIUM 8.7* 8.5*    Intake/Output Summary (Last 24 hours) at 03/16/2020 0854 Last data filed at 03/16/2020 0800 Gross per 24 hour  Intake 582 ml  Output 500 ml  Net 82 ml     Physical Exam: Vital Signs Blood pressure (!) 149/63, pulse 70, temperature 98.1 F (36.7 C), resp. rate 16, height 6\' 3"  (1.905 m), weight 106.3 kg, SpO2 95 %.  Constitutional: pt supine in bed- appropriate, RN at bedside,  NAD HEENT: EOMI, oral membranes moist Neck: supple Cardiovascular: RRR    Respiratory/Chest: CTA B/L- no W/R/R- good air movement  GI/Abdomen: hypoactive BS; soft, somewhat distended- NT Ext: no clubbing, cyanosis, tr edema Psych: appropriate; joking occ.  Neuro:  Comments: UEs- biceps 5-/5, WE 4+/5, triceps 4-/5, grip 4-/5, finger abd 3+/5 B/L LEs- R HF 4+/5; L HF 4/5- otherwise 4+/5 in KE, DF, PF and EHL B/L   Ox3 Decreased sensation to light touch in L1 to S2 B/L- otherwise intact in UEs and torso B/L MAS of 1 in LUE and LEs- MAS of 0 in RUE--no changes Spasticity/clawing appearing of L>R hands- also notable is intrinsic muscle atrophy- L>R hands Skin:intact- staples in posterior neck- healing well- no erythema  Assessment/Plan: 1. 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  Physiatrist is providing close team supervision and 24 hour management of active medical problems listed below.  Physiatrist and rehab team continue to assess barriers to discharge/monitor patient progress toward functional and medical goals  Care Tool:  Bathing    Body parts bathed by patient: Right arm, Left arm, Chest, Abdomen, Front perineal area, Face, Right upper leg, Left upper leg, Right lower leg   Body parts bathed by helper: Left lower leg, Buttocks     Bathing assist Assist Level: Minimal Assistance -  Patient > 75%     Upper Body Dressing/Undressing Upper body dressing   What is the patient wearing?: Pull over shirt    Upper body assist Assist Level: Maximal Assistance - Patient 25 - 49%    Lower Body Dressing/Undressing Lower body dressing      What is the patient wearing?: Incontinence brief, Pants     Lower body assist Assist for lower body dressing: Maximal Assistance - Patient 25 - 49%      Toileting Toileting Toileting Activity did not occur Landscape architect and hygiene only): Refused  Toileting assist Assist for toileting: 2 Helpers     Transfers Chair/bed transfer  Transfers assist     Chair/bed transfer assist level: Moderate Assistance - Patient 50 - 74%     Locomotion Ambulation   Ambulation assist      Assist level: Maximal Assistance - Patient 25 - 49% Assistive device: Walker-rolling Max distance: 5   Walk 10 feet activity   Assist  Walk 10 feet activity did not occur: Safety/medical concerns        Walk 50 feet activity   Assist Walk 50 feet with 2 turns activity did not occur: Safety/medical concerns         Walk 150 feet activity   Assist Walk 150 feet activity did not occur: Safety/medical concerns         Walk 10 feet on uneven surface  activity   Assist Walk 10 feet on uneven surfaces activity did not occur: Safety/medical concerns         Wheelchair     Assist Will patient use wheelchair at discharge?: Yes Type of Wheelchair: Manual    Wheelchair assist level: Total Assistance - Patient < 25% Max wheelchair distance: 10    Wheelchair 50 feet with 2 turns activity    Assist        Assist Level: Maximal Assistance - Patient 25 - 49%   Wheelchair 150 feet activity     Assist      Assist Level: Total Assistance - Patient < 25%   Blood pressure (!) 149/63, pulse 70, temperature 98.1 F (36.7 C), resp. rate 16, height 6\' 3"  (1.905 m), weight 106.3 kg, SpO2 95 %.  Medical Problem List and Plan: 1.Cervical myelopathysecondary to cervical spinal cord compression status post cervical laminectomy and medialfacetectomyC3-4 4-5 posterior cervical arthrodesis C3-C6. Cervical collar at all times? 8/20- pt was given soft collar- refuses to wear it -patient may Shower with covering incision -ELOS/Goals: min A to Mod I, 3 weeks 2.  Antithrombotics: -DVT/anticoagulation: .  vascular study ok  8/21 lovenox  -antiplatelet therapy: N/A 3. Pain Management:Neurontin 300 mg twice daily,baclofen 5 mg 3 times daily,Cymbalta 60 mg daily, oxycodone as needed   8/22 holding scheduled gabapentin and baclofen given ileus  8/23- will restart Baclofen 5 mg TID since having bad muscle spasms of hands L>R- explained to pt.  4. Mood:Provide emotional support -antipsychotic agents: N/A 5. Neuropsych: This patientiscapable of making decisions on hisown behalf. 6. Skin/Wound Care:Routine skin checks 7. Fluids/Electrolytes/Nutrition:Routine in and outs with follow-up chemistries 8. Hypertension. Hydralazine 25 mg twice daily, labetalol 100 mg twice daily, lisinopril 40 mg daily. Monitor with increased mobility 9. Diabetes mellitus with peripheral neuropathy. Hemoglobin A1c 7.1. NovoLog 12units 3 times daily with meals, Lantus insulin 55units daily. Check blood sugars before meals and at bedtime. Diabetic teaching CBG (last 3)  Recent Labs    03/16/20 0402 03/16/20 0737 03/16/20 0817  GLUCAP 108* 148* 169*  8/20-22- BG's have been very labile-    -po intake inconsistent also   -reducing lantus given ileus, npo, etc   -adjusted mealtime novolog based on how much he eats    -call DM coordinator Monday  8/23- placed DM coordinator's consult  8/24- decreased Novolog and made SSI sensitive 10. CKD stage II. Creatinine baseline 1.46. Follow-up chemistries  8/20- Baseline is 1.4-   8/22 Cr 1 today, holding lasix for now  -kayexalate for hyperkalemia 11. Morbid obesity. BMI 30.09. Dietary follow-up 12. History of alcohol tobacco cocaine use. Providing counseling 13. Diastolic congestive heart failure. Lasix 40 mg daily. Monitor with increased mobility   Filed Weights   03/13/20 0459 03/14/20 0500 03/15/20 0500  Weight: 105 kg 105.6 kg 106.3 kg    8/22 weights down  8/23-  weight up 1 kg- in spite of pooping.  8/24- no weight today so far 14. Hyperlipidemia. Lipitor 15. Neurogenic bowel/ileus and bladder- goes every 1 week - which is likely the slowing of neurogenic bowel - need to improve speed; also has urinary urgency, which is also neurogenic- will monitor and treat as required.   8/20- gave Sorbitol to get pt to have BM today- has been 7-8 days- usually goes 1x/week  8/22 colonic ileus on xray and CT. No SBO   -radiologist read today's kub as unchanged. I see definite improvement and patient feels better   -will begin clear liquid diet only   -continue iv reglan, ivf   -recheck kub tomorrow  8/23- still has moderate stool burden- will give another dose Sorbitol and then change back to regular diet if poops. Also change reglan to PO 10 mg TID-AC x 2 days- since lost IV access.   8/24- didn't have another BM- will give 1/2 bottle Mg citrate at noon and by dinner, change diet back to regular DM diet.  16. Spasticity- on Baclofen 5mg  TID- adequately controlled- mainly annoying, not painful- will monitor muscle spasms. 17. Hyperkalemia  8/20- Not on any K+ replacement- K+ is 5.1-max level of normal- will monitor closely.    8/22 kayexalate today 18. Leukocytosis-   8/20- on Decadron slow taper- WBC 20k-   will check U/A and Cx just in case- afebrile- but will monitor for sx's of infection.    8/21-22 ua +, ucx pending--> await results   -suspect ileus playing a role too  8/23- U/A (+) somewhat- U Cx growing >100k E Coli- WBC down to 16.6k  8/24- UTI- EColi- will start Bactrim for UTI- based on Cx's- Bactrim DS BID x 7 days.    LOS: 5 days A FACE TO FACE EVALUATION WAS PERFORMED  Baylyn Sickles 03/16/2020, 8:54 AM

## 2020-03-17 ENCOUNTER — Inpatient Hospital Stay (HOSPITAL_COMMUNITY): Payer: Medicare Other | Admitting: Physical Therapy

## 2020-03-17 ENCOUNTER — Inpatient Hospital Stay (HOSPITAL_COMMUNITY): Payer: Medicare Other | Admitting: Occupational Therapy

## 2020-03-17 LAB — GLUCOSE, CAPILLARY
Glucose-Capillary: 101 mg/dL — ABNORMAL HIGH (ref 70–99)
Glucose-Capillary: 128 mg/dL — ABNORMAL HIGH (ref 70–99)
Glucose-Capillary: 135 mg/dL — ABNORMAL HIGH (ref 70–99)
Glucose-Capillary: 146 mg/dL — ABNORMAL HIGH (ref 70–99)
Glucose-Capillary: 224 mg/dL — ABNORMAL HIGH (ref 70–99)
Glucose-Capillary: 82 mg/dL (ref 70–99)
Glucose-Capillary: 95 mg/dL (ref 70–99)

## 2020-03-17 NOTE — Progress Notes (Signed)
Physical Therapy Session Note  Patient Details  Name: [name] MRN: [id number] Date of Birth: [birth year]  Today's Date: 03/17/2020 PT Individual Time: 0845-1000; 1415-1500 PT Individual Time Calculation (min): 75 min and 45 min PT Missed Minutes: 30 minutes Missed Time Reason: patient fatigue  Short Term Goals: Week 1:  PT Short Term Goal 1 (Week 1): pt to demonstate min A x1 bed mobility PT Short Term Goal 2 (Week 1): pt to demonstrate min A x1 functional transfers with LRAD PT Short Term Goal 3 (Week 1): pt to demonstrate 110' with LRAD mod A x1 PT Short Term Goal 4 (Week 1): pt to demonstrate dynamic sitting balance for functional tasks at min A PT Short Term Goal 4 - Progress (Week 1): Not met  Skilled Therapeutic Interventions/Progress Updates:    Session 1: Pt received supine in bed having just received AM medications from nursing, agreeable to PT session. No complaints of pain. Supine to sit with CGA with cues for log roll technique. Assisted pt with changing out of hospital gown into paper scrub top and bottoms while seated EOB. Pt requires assist to don soft collar while seated EOB. Sit to stand with min A to RW from elevated bed, pt tends to remain in flexed hip posture and lean posteriorly during transfer. Pt able to assist with pulling pants up over hips once in standing. Stand pivot transfer bed to w/c with RW and min A with increased time and cues needed. Pt takes very small steps with minimal LE clearance during transfer. Pt taken off unit outdoors for first portion of session for improved mood and therapy buy-in. Pt able to perform w/c mobility in a functional environment with focus on obstacle avoidance and efficient w/c propulsion technique. Pt able to propel w/c 100 ft with min A needed for steering. Pt exhibits poor endurance and poor ability to follow v/c to propel w/c correctly. Seated volleyball with use of 3# dowel rod, 3 x 15 reps to fatigue. Seated BLE strengthening  therex with use of orange theraband: HS curls, hip abd, hip add x 15 reps each. Returned upstairs to therapy gym for remainder of session. Attempt to have pt perform sit to stand in // bars, pt unable to come to a full stand. Pt then reports urinary incontinence. Sit to stand with max A to stedy, pt able to assist with some pericare and clothing management in standing with stedy. Stedy transfer to recliner. Pt left seated in recliner in room with needs in reach, quick release belt and chair alarm in place at end of session.  Session 2: Pt received sidelying in bed asleep, arousable and agreeable to PT session. No complaints of pain at rest. Supine to with with mod A for trunk control this PM. Assisted pt with donning pants while seated EOB. Attempt to have pt stand to RW to pull up pants, pt unable to safely stand to RW. Attempt to have pt stand to stedy with assist x 1, pt unable to stand to stedy with assist x 1. Obtained a 2nd person and pt then able to stand to stedy with max assist x 2. Assisted pt with pulling up pants over hips in standing. Pt then able to perform 2 more stands to stedy from elevated bed with max A x 1. Pt able to perform standing mini-squats x 5 reps before onset of fatigue. Pt then reports onset of upper back/shoulder pain. Attempt to have pt perform OH shoulder abduction stretch, unable to  tolerate due to pain. Pt able to perform seated shoulder rolls and reports some relief. Pt declines further participation in therapy session due to fatigue. Pt is Supervision to return to supine, provided short-acting hot pack to upper back region for pain management. Pt left supine in bed with needs in reach, bed alarm in place at end of session.  Therapy Documentation Precautions:  Precautions Precautions: Cervical Precaution Booklet Issued: No Required Braces or Orthoses: Cervical Brace Cervical Brace: Soft collar (when up OOB) Restrictions Weight Bearing Restrictions:  No    Therapy/Group: Individual Therapy   Excell Seltzer, PT, DPT  03/17/2020, 12:51 PM

## 2020-03-17 NOTE — Progress Notes (Signed)
[facility] PHYSICAL MEDICINE & REHABILITATION PROGRESS NOTE   Subjective/Complaints:  Pt reports he's tired of being in bed- wants to sit up -which he got into sitting position by himself with hospital bed- took awhile, but did it.   Also reports hand pain is better.  Ready to get regular diet back! No specific issues this AM   ROS:   Pt denies SOB, abd pain, CP, N/V/C/D, and vision changes   Objective:   VAS [name] LOWER EXTREMITY VENOUS (DVT)  Result Date: 03/15/2020  Lower Venous DVTStudy Indications: Edema.  Risk Factors: Immobility Surgery 03-05-2020 Cervical spine. Comparison Study: Prior LLEV 12-19-2017 WNL Performing Technologist: Darlin Coco  Examination Guidelines: A complete evaluation includes B-mode imaging, spectral Doppler, color Doppler, and power Doppler as needed of all accessible portions of each vessel. Bilateral testing is considered an integral part of a complete examination. Limited examinations for reoccurring indications may be performed as noted. The reflux portion of the exam is performed with the patient in reverse Trendelenburg.  +---------+---------------+---------+-----------+----------+--------------+ RIGHT    CompressibilityPhasicitySpontaneityPropertiesThrombus Aging +---------+---------------+---------+-----------+----------+--------------+ CFV      Full           Yes      Yes                                 +---------+---------------+---------+-----------+----------+--------------+ SFJ      Full                                                        +---------+---------------+---------+-----------+----------+--------------+ FV Prox  Full                                                        +---------+---------------+---------+-----------+----------+--------------+ FV Mid   Full                                                        +---------+---------------+---------+-----------+----------+--------------+ FV DistalFull                                                         +---------+---------------+---------+-----------+----------+--------------+ PFV      Full                                                        +---------+---------------+---------+-----------+----------+--------------+ POP      Full           Yes      Yes                                 +---------+---------------+---------+-----------+----------+--------------+  PTV      Full                                                        +---------+---------------+---------+-----------+----------+--------------+ PERO     Full                                                        +---------+---------------+---------+-----------+----------+--------------+   +---------+---------------+---------+-----------+----------+--------------+ LEFT     CompressibilityPhasicitySpontaneityPropertiesThrombus Aging +---------+---------------+---------+-----------+----------+--------------+ CFV      Full           Yes      Yes                                 +---------+---------------+---------+-----------+----------+--------------+ SFJ      Full                                                        +---------+---------------+---------+-----------+----------+--------------+ FV Prox  Full                                                        +---------+---------------+---------+-----------+----------+--------------+ FV Mid   Full                                                        +---------+---------------+---------+-----------+----------+--------------+ FV DistalFull                                                        +---------+---------------+---------+-----------+----------+--------------+ PFV      Full                                                        +---------+---------------+---------+-----------+----------+--------------+ POP      Full           Yes      Yes                                  +---------+---------------+---------+-----------+----------+--------------+ PTV      Full                                                        +---------+---------------+---------+-----------+----------+--------------+  PERO     Full                                                        +---------+---------------+---------+-----------+----------+--------------+     Summary: RIGHT: - There is no evidence of deep vein thrombosis in the lower extremity.  - No cystic structure found in the popliteal fossa.  LEFT: - There is no evidence of deep vein thrombosis in the lower extremity.  - No cystic structure found in the popliteal fossa.  *See table(s) above for measurements and observations. Electronically signed by Ruta Hinds MD on 03/15/2020 at 5:08:36 PM.    Final    Recent Labs    03/15/20 0443  WBC 16.6*  HGB 11.9*  HCT 36.5*  PLT 222   Recent Labs    03/15/20 0443  NA 132*  K 5.1  CL 98  CO2 25  GLUCOSE 143*  BUN 31*  CREATININE 1.07  CALCIUM 8.5*    Intake/Output Summary (Last 24 hours) at 03/17/2020 0831 Last data filed at 03/17/2020 0700 Gross per 24 hour  Intake 660 ml  Output 150 ml  Net 510 ml     Physical Exam: Vital Signs Blood pressure 120/63, pulse 70, temperature 98.3 F (36.8 C), temperature source Oral, resp. rate 20, height 6\' 3"  (1.905 m), weight 100.7 kg, SpO2 99 %.  Constitutional: pt initially supine- pulled self to sit at EOB with help of bed, smiling, NAD HEENT: EOMI, oral membranes moist Neck: supple Cardiovascular: RRR    Respiratory/Chest: CTA B/L- no W/R/R- good air movement GI/Abdomen: Soft, NT, ND, (+)BS - hypoactive, but better Ext: no clubbing, cyanosis, tr edema Psych: joking a lot.  Neuro:  Comments: UEs- biceps 5-/5, WE 4+/5, triceps 4-/5, grip 4-/5, finger abd 3+/5 B/L LEs- R HF 4+/5; L HF 4/5- otherwise 4+/5 in KE, DF, PF and EHL B/L   Ox3 Decreased sensation to light touch in L1 to S2 B/L- otherwise intact  in UEs and torso B/L MAS of 1 in LUE and LEs- MAS of 0 in RUE--no changes Spasticity/clawing appearing of L>R hands- also notable is intrinsic muscle atrophy- L>R hands- less clawing notable this AM Skin:intact- staples in posterior neck- healing well- no erythema  Assessment/Plan: 1. 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  Physiatrist is providing close team supervision and 24 hour management of active medical problems listed below.  Physiatrist and rehab team continue to assess barriers to discharge/monitor patient progress toward functional and medical goals  Care Tool:  Bathing    Body parts bathed by patient: Right arm, Left arm, Chest, Abdomen, Front perineal area, Face, Right upper leg, Left upper leg, Right lower leg   Body parts bathed by helper: Left lower leg, Buttocks     Bathing assist Assist Level: Minimal Assistance - Patient > 75%     Upper Body Dressing/Undressing Upper body dressing   What is the patient wearing?: Pull over shirt    Upper body assist Assist Level: Maximal Assistance - Patient 25 - 49%    Lower Body Dressing/Undressing Lower body dressing      What is the patient wearing?: Incontinence brief, Pants  Lower body assist Assist for lower body dressing: Maximal Assistance - Patient 25 - 49%     Toileting Toileting Toileting Activity did not occur Landscape architect and hygiene only): Refused  Toileting assist Assist for toileting: Maximal Assistance - Patient 25 - 49% (standing in the [place])     Transfers Chair/bed transfer  Transfers assist     Chair/bed transfer assist level: Moderate Assistance - Patient 50 - 74%     Locomotion Ambulation   Ambulation assist      Assist level: Maximal Assistance - Patient 25 - 49% Assistive device:  Walker-rolling Max distance: 5   Walk 10 feet activity   Assist  Walk 10 feet activity did not occur: Safety/medical concerns        Walk 50 feet activity   Assist Walk 50 feet with 2 turns activity did not occur: Safety/medical concerns         Walk 150 feet activity   Assist Walk 150 feet activity did not occur: Safety/medical concerns         Walk 10 feet on uneven surface  activity   Assist Walk 10 feet on uneven surfaces activity did not occur: Safety/medical concerns         Wheelchair     Assist Will patient use wheelchair at discharge?: Yes Type of Wheelchair: Manual    Wheelchair assist level: Total Assistance - Patient < 25% Max wheelchair distance: 10    Wheelchair 50 feet with 2 turns activity    Assist        Assist Level: Maximal Assistance - Patient 25 - 49%   Wheelchair 150 feet activity     Assist      Assist Level: Total Assistance - Patient < 25%   Blood pressure 120/63, pulse 70, temperature 98.3 F (36.8 C), temperature source Oral, resp. rate 20, height 6\' 3"  (1.905 m), weight 100.7 kg, SpO2 99 %.  Medical Problem List and Plan: 1.Cervical myelopathysecondary to cervical spinal cord compression status post cervical laminectomy and medialfacetectomyC3-4 4-5 posterior cervical arthrodesis C3-C6. Cervical collar at all times? 8/20- pt was given soft collar- refuses to wear it -patient may Shower with covering incision -ELOS/Goals: min A to Mod I, 3 weeks 2. Antithrombotics: -DVT/anticoagulation: .  vascular study ok  8/21 lovenox  -antiplatelet therapy: N/A 3. Pain Management:Neurontin 300 mg twice daily,baclofen 5 mg 3 times daily,Cymbalta 60 mg daily, oxycodone as needed   8/22 holding scheduled gabapentin and baclofen given ileus  8/23- will restart Baclofen 5 mg TID since having bad muscle spasms of hands L>R- explained to pt.   8/25- pain doing  better/spasms better 4. Mood:Provide emotional support -antipsychotic agents: N/A 5. Neuropsych: This patientiscapable of making decisions on hisown behalf. 6. Skin/Wound Care:Routine skin checks 7. Fluids/Electrolytes/Nutrition:Routine in and outs with follow-up chemistries 8. Hypertension. Hydralazine 25 mg twice daily, labetalol 100 mg twice daily, lisinopril 40 mg daily. Monitor with increased mobility 9. Diabetes mellitus with peripheral neuropathy. Hemoglobin A1c 7.1. NovoLog 12units 3 times daily with meals, Lantus insulin 55units daily. Check blood sugars before meals and at bedtime. Diabetic teaching CBG (last 3)  Recent Labs    03/17/20 0000 03/17/20 0352 03/17/20 0718  GLUCAP 101* 128* 95    8/20-22- BG's have been very labile-   8/23- placed DM coordinator's consult  8/24- decreased Novolog and made SSI sensitive  8/25- BGs look great- con't regimen, but changing back to regular diabetic diet today 10. CKD stage II. Creatinine baseline 1.46. Follow-up chemistries  8/20- Baseline is 1.4-   8/22 Cr 1 today, holding lasix for now  -kayexalate for hyperkalemia  8/25- recheck labs in AM 11. Morbid obesity. BMI 30.09. Dietary follow-up 12. History of alcohol tobacco cocaine use. Providing counseling 13. Diastolic congestive heart failure. Lasix 40 mg daily. Monitor with increased mobility   Filed Weights   03/14/20 0500 03/15/20 0500 03/17/20 0306  Weight: 105.6 kg 106.3 kg 100.7 kg    8/22 weights down  8/23- weight up 1 kg- in spite of pooping.  8/24- no weight today so far  8/25- Weight down 6 kg to 100.7 kg- ? 14. Hyperlipidemia. Lipitor 15. Neurogenic bowel/ileus and bladder- goes every 1 week - which is likely the slowing of neurogenic bowel - need to improve speed; also has urinary urgency, which is also neurogenic- will monitor and treat as required.   8/20- gave Sorbitol to get pt to have BM today- has been 7-8 days-  usually goes 1x/week  8/22 colonic ileus on xray and CT. No SBO   -radiologist read today's kub as unchanged. I see definite improvement and patient feels better   -will begin clear liquid diet only   -continue iv reglan, ivf   -recheck kub tomorrow  8/23- still has moderate stool burden- will give another dose Sorbitol and then change back to regular diet if poops. Also change reglan to PO 10 mg TID-AC x 2 days- since lost IV access.   8/24- didn't have another BM- will give 1/2 bottle Mg citrate at noon and by dinner, change diet back to regular DM diet.   8/25- 2 large BMs yesterday- put back to regular/DM diet-    16. Spasticity- on Baclofen 5mg  TID- adequately controlled- mainly annoying, not painful- will monitor muscle spasms. 17. Hyperkalemia  8/20- Not on any K+ replacement- K+ is 5.1-max level of normal- will monitor closely.    8/22 kayexalate today 18. Leukocytosis-   8/20- on Decadron slow taper- WBC 20k-   will check U/A and Cx just in case- afebrile- but will monitor for sx's of infection.    8/21-22 ua +, ucx pending--> await results   -suspect ileus playing a role too  8/23- U/A (+) somewhat- U Cx growing >100k E Coli- WBC down to 16.6k  8/24- UTI- EColi- will start Bactrim for UTI- based on Cx's- Bactrim DS BID x 7 days.  8/25- no complaints of Sx's.     LOS: 6 days A FACE TO FACE EVALUATION WAS PERFORMED  Giavonna Pflum 03/17/2020, 8:31 AM

## 2020-03-17 NOTE — Progress Notes (Signed)
Patient Details  Name: [name] MRN: [id number] Date of Birth: 04-Mar-1961  Today's Date: 03/17/2020  Hospital Problems: Principal Problem:   Cervical myelopathy ([place]) Active Problems:   Hyperlipidemia associated with type 2 diabetes mellitus ([place])  Past Medical History:  Past Medical History:  Diagnosis Date   Alcohol abuse    11-15-2017  per pt last alcohol Dec 2018   BPH with obstruction/lower urinary tract symptoms    Chronic arm pain    Chronic pain    arms, leg, back   CKD (chronic kidney disease), stage II    Cocaine abuse ([place])    11-15-2017  per pt last used March 2019   Congenital hydrocephalus, unspecified ([place])    slow progression   Diabetic retinopathy of both eyes ([facility])    Diastolic CHF, chronic ([place]) 07/2017   Gait instability    multifactorial -- slow worsening congenital hydrocephalus, peripheral neuropathy, left C5-6 cord lesion   Hematuria    History of acute pulmonary edema 07/2017   History of spinal cord injury 01/02/2014   pt fell, caused spinal cord contusion at C5-6--  residual left side weakness     History of ST elevation myocardial infarction (STEMI) 12/26/2013   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   History of TIA (transient ischemic attack) and stroke    hx cva 12-20-2010 and  TIA 02-22-2011--- no residual's from cva or tia   Hyperlipidemia    Hypertension    followed by pcp   Left-sided weakness 12/2013   chronic due to spinal cord contusion   Lesion of bladder    Peripheral edema    chronic LUE   Peripheral neuropathy    Type 2 diabetes mellitus ([place])    followed by dr Karle Plumber--  last A1c 6.8 on 11-06-2017   Weak urinary stream    Past Surgical History:  Past Surgical History:  Procedure Laterality Date   CARDIAC CATHETERIZATION  12/01/2008   dr hochrein   abnormal stress myoview:  mild coronary plaque, normal LVF   CARDIAC CATHETERIZATION  04/29/2011   dr Martinique   in setting ECG with new  ST elevation & severe hypertensive:  nonobstructive atherosclerotic CAD, normal LVF (30% mRCA)    CARDIOVASCULAR STRESS TEST  08/15/2010   Low nuclear study w/ no evidence ishemia/  ef 42% with lateral and apical hypokinesis   CYSTOSCOPY WITH BIOPSY N/A 11/20/2017   Procedure: CYSTOSCOPY WITH BIOPSY AND FULGURATION;  Surgeon: [name], MD;  Location: [facility];  Service: Urology;  Laterality: N/A;   INCISION AND DRAINAGE RIGHT DISTAL MEDIAL THIGH HEMATOMA  04-14-2005   dr Ninfa Linden   left arm skin graft  1976   injury   LEFT HEART CATHETERIZATION WITH CORONARY ANGIOGRAM Bilateral 12/26/2013   Procedure: LEFT HEART CATHETERIZATION WITH CORONARY ANGIOGRAM;  Surgeon: Burnell Blanks, MD;  Location[facility] C[facility] LAB;  Service: Cardiovascular;  Laterality: Bilateral;  STEMI, in setting cocaine/ alcohol :  mid disease in the RCA (20%), moderate disease in the small caliber intermediate branch (distal 50-60%), normal LVSF (ef 55-60%)   POSTERIOR CERVICAL FUSION/FORAMINOTOMY N/A 03/05/2020   Procedure: CERVICAL THREE-FOUR, CERVICAL FOUR-FIVE, CERVICAL FIVE-SIX POSTERIOR CERVICAL FUSION/FORAMINOTOMY;  Surgeon: Eustace Moore, MD;  [facility];  Service: Neurosurgery;  Laterality: N/A;   TOOTH EXTRACTION N/A 05/16/2018   Procedure: DENTAL RESTORATION/EXTRACTIONS WITH ALVEO;  Surgeon: Diona Browner, DDS;  Location: [facility];  Service: Oral Surgery;  Laterality: N/A;   TRANSTHORACIC ECHOCARDIOGRAM  08/05/2017  moderate LVH,  ef 55%,  grade 1 diastolic dysfunction/  mild LAE/  trivial Tr   Social History:  reports that he quit smoking about 2 years ago. His smoking use included cigarettes. He quit after 45.00 years of use. He has never used smokeless tobacco. He reports previous alcohol use. He reports previous drug use. Drugs: "Crack" cocaine and Cocaine.  Family / Support Systems Marital Status: Divorced How Long?: 13 years Spouse/Significant Other:  Divorced Children: None Other Supports: friend Enid Derry and sister LaDonna Anticipated Caregiver: Enid Derry; Ladonna Ability/Limitations of Caregiver: None reported Caregiver Availability: 24/7 Family Dynamics: Pt lives alone.  Social History Preferred language: English Religion: Christian Cultural Background: Pt last job was [employer] for 2 yrs; States he went out on disability in 2011 Education: 10th grade Read: Yes Write: Yes Employment Status: Disabled Date Retired/Disabled/Unemployed: 2011 Public relations account executive Issues: Denies Guardian/Conservator: N/A   Abuse/Neglect Abuse/Neglect Assessment Can Be Completed: Yes Physical Abuse: Denies Verbal Abuse: Denies Sexual Abuse: Denies Exploitation of patient/patient's resources: Denies Self-Neglect: Denies  Emotional Status Pt's affect, behavior and adjustment status: Pt in good spirits at time of visit Recent Psychosocial Issues: Denies Psychiatric History: Denies Substance Abuse History: Pt admits he quit smoking cigarettes a few weeks (prior to admission)/was smoking 1pk every 3 days. Etoh occassionally; cocaine on/off for 5 yrs; last use was 2 weeks ago (prior to admission).  Patient / Family Perceptions, Expectations & Goals Pt/Family understanding of illness & functional limitations: Pt has general understanding of care needs Premorbid pt/family roles/activities: Independent Anticipated changes in roles/activities/participation: Assistance with ADLs/IADLs Pt/family expectations/goals: To get strong enough to go home.  Community Resources Express Scripts: None Premorbid Home Care/DME Agencies: None Transportation available at discharge: TBD Resource referrals recommended: Neuropsychology  Discharge Planning Living Arrangements: Alone Support Systems: Water engineer, Other relatives Type of Residence: Private residence Insurance Resources: [employer] (specify county), [employer] Resources: [employer]  Screen Referred: No Living Expenses: Education officer, community Management: Patient Does the patient have any problems obtaining your medications?: No Home Management: Patient Patient/Family Preliminary Plans: Get strong enough to go home Care Coordinator Barriers to Discharge: Decreased caregiver support, Lack of/limited family support Care Coordinator Anticipated Follow Up Needs: HH/OP Expected length of stay: 20-22 days  Clinical Impression SW met with pt in room at bedside to introduce self, explain role, and discuss discharge process. Pt is not a English as a second language teacher. No HCPOA. DME: rollator and cane. Pt states SW primary contact to discuss his discharge will be his friend Enid Derry.   Marasia Newhall A Lorenda Grecco 03/17/2020, 10:53 AM

## 2020-03-17 NOTE — Progress Notes (Signed)
Patient ID: [name], male   DOB: 05/04/1961, 59 y.o.   MRN: [id number]   Active [place] PASRR# [id number]  SW met with pt in room to discuss reports with regard to SNF. Pt amenable. SW to provide pt with SNF list for pt to review.   Loralee Pacas, MSW, [place] Office: [phone] Cell: [phone] Fax: [phone]

## 2020-03-17 NOTE — Progress Notes (Signed)
Occupational Therapy Session Note  Patient Details  Name: [name] MRN: [id number] Date of Birth: 07-11-1961  Today's Date: 03/17/2020 OT Individual Time: 9672-8979 OT Individual Time Calculation (min): 43 min    Short Term Goals: Week 1:  OT Short Term Goal 1 (Week 1): Patient will don UB clothing with set-up assist. OT Short Term Goal 2 (Week 1): Patient will thread BLE through LB clothing with AE PRN. OT Short Term Goal 3 (Week 1): Patient will complete toilet transfers with Mod A. OT Short Term Goal 4 (Week 1): Patient will complete LB bathing will complete 1/3 parts of toileting.  Skilled Therapeutic Interventions/Progress Updates:  OT treatment session with focus on functional transfers, postural control, static standing balance, and self-care re-education as detailed below. Sidelying to EOB transfer with Min A and stand-pivot transfer to TIS wc with Mod A and use of RW. Patient completed oral hygiene seated at sink level with set-up assist. Total A for wc transport to and from therapy gym and stand-pivot transfer to mat table with Mod A. Seated postural control to facilitate adherence to cervical precautions with visual feedback from standing mirror. Without cueing for correction, patient sits with shoulders rounded, head forward, and gaze downward. Patient reports discomfort in cervical neck with upright posture but able to hold position for 30 seconds at a time. Session concluded with patient seated in TIS wc with call bell within reach, belt alarm activated, and all needs met.   Therapy Documentation Precautions:  Precautions Precautions: Cervical Precaution Booklet Issued: No Required Braces or Orthoses: Cervical Brace Cervical Brace: Soft collar (when up OOB) Restrictions Weight Bearing Restrictions: No General:    Therapy/Group: Individual Therapy  [name] 03/17/2020, 12:45 PM

## 2020-03-17 NOTE — NC FL2 (Signed)
[place] LEVEL OF CARE SCREENING TOOL     IDENTIFICATION  Patient Name: [name] Birthdate: [birth year] Sex: male Admission Date (Current Location): 03/11/2020  [place] and [place] Number:  Kathleen Argue [id number] [place] and Address:         Provider Number: [id number]  Attending Physician Name and Address:  Courtney Heys, MD  Relative Name and Phone Number:  Ifeanyichukwu Wickham (Sister) [phone]    Current Level of Care: Hospital Recommended Level of Care: [place] Prior Approval Number:    Date Approved/Denied:   PASRR Number: [id number]  Discharge Plan: SNF    Current Diagnoses: Patient Active Problem List   Diagnosis Date Noted  . Cervical myelopathy ([place]) 03/11/2020  . Cervical stenosis of spinal canal 03/05/2020  . Spinal stenosis, cervical region 02/26/2020  . Cervical spinal cord compression ([place])   . Diastolic congestive heart failure ([place]) 02/12/2020  . History of spinal cord injury 04/17/2019  . TIA (transient ischemic attack) 03/05/2018  . Benign essential HTN 03/05/2018  . Sensorineural hearing loss of both ears 12/04/2017  . Mild nonproliferative diabetic retinopathy ([place]) 01/10/2016  . Hypertensive retinopathy of both eyes 01/10/2016  . Spinal stenosis of lumbar region 07/15/2015  . Abnormality of gait 06/07/2015  . Hydrocephalus ([place]) 06/07/2015  . Peripheral neuropathy 04/26/2015  . Constipation 04/26/2015  . Type 2 diabetes mellitus ([place]) 03/16/2015  . Decreased hearing of both ears 03/15/2015  . Tinea pedis 03/15/2015  . Pre-ulcerative calluses 05/22/2014  . Cocaine abuse ([place]) 12/28/2013  . Left-sided weakness 12/26/2013  . Hyperlipidemia associated with type 2 diabetes mellitus ([place]) 04/01/2007  . Class 2 severe obesity due to excess calories with serious comorbidity and body mass index (BMI) of 36.0 to 36.9 in adult ([place]) 03/28/2007  . Essential hypertension 03/28/2007    Orientation RESPIRATION  BLADDER Height & Weight     Self, Time, Situation, Place  Normal Continent Weight: 222 lb 0.1 oz (100.7 kg) Height:  6\' 3"  (190.5 cm)  BEHAVIORAL SYMPTOMS/MOOD NEUROLOGICAL BOWEL NUTRITION STATUS      Continent Diet (clear liquids)  AMBULATORY STATUS COMMUNICATION OF NEEDS Skin   Limited Assist Verbally Other (Comment) (coccyx- stage 2 healing and open to air)                       Personal Care Assistance Level of Assistance  Bathing, Dressing Bathing Assistance: Limited assistance   Dressing Assistance: Limited assistance     Functional Limitations Info             SPECIAL CARE FACTORS FREQUENCY  PT (By licensed PT), OT (By licensed OT) (Diabetic- q4 finger stick; Novolog 6 units with each meal; Lantus at bedtime)     PT Frequency: 5xs per day OT Frequency: 5xs per day            Contractures Contractures Info: Not present    Additional Factors Info                  Current Medications (03/17/2020):  This is the current hospital active medication list Current Facility-Administered Medications  Medication Dose Route Frequency Provider Last Rate Last Admin  . 0.45 % sodium chloride infusion   Intravenous Continuous Lovorn, Megan, MD 75 mL/hr at 03/13/20 2300 New Bag at 03/13/20 2300  . acetaminophen (TYLENOL) tablet 650 mg  650 mg Oral Q4H PRN Cathlyn Parsons, PA-C   650 mg at 03/16/20 1444   Or  . acetaminophen (TYLENOL) suppository  650 mg  650 mg Rectal Q4H PRN Angiulli, Lavon Paganini, PA-C      . atorvastatin (LIPITOR) tablet 40 mg  40 mg Oral Daily Cathlyn Parsons, PA-C   40 mg at 03/17/20 0809  . baclofen (LIORESAL) tablet 5 mg  5 mg Oral TID Lovorn, Megan, MD   5 mg at 03/17/20 1310  . dexamethasone (DECADRON) tablet 1 mg  1 mg Oral Q8H Angiulli, Lavon Paganini, PA-C   1 mg at 03/17/20 0809  . DULoxetine (CYMBALTA) DR capsule 60 mg  60 mg Oral Daily Cathlyn Parsons, PA-C   60 mg at 03/17/20 0809  . enoxaparin (LOVENOX) injection 40 mg  40 mg  Subcutaneous Q24H Lovorn, Megan, MD   40 mg at 03/16/20 1443  . hydrALAZINE (APRESOLINE) tablet 25 mg  25 mg Oral BID Cathlyn Parsons, PA-C   25 mg at 03/17/20 0809  . insulin aspart (novoLOG) injection 0-9 Units  0-9 Units Subcutaneous Q4H Bary Leriche, PA-C   1 Units at 03/16/20 1300  . insulin aspart (novoLOG) injection 6 Units  6 Units Subcutaneous TID WC Bary Leriche, PA-C   6 Units at 03/17/20 1309  . insulin glargine (LANTUS) injection 45 Units  45 Units Subcutaneous Daily Meredith Staggers, MD   45 Units at 03/17/20 [phone]  . labetalol (NORMODYNE) tablet 100 mg  100 mg Oral BID Cathlyn Parsons, PA-C   100 mg at 03/17/20 0809  . lisinopril (ZESTRIL) tablet 40 mg  40 mg Oral Daily Cathlyn Parsons, PA-C   40 mg at 03/17/20 0809  . metoCLOPramide (REGLAN) tablet 10 mg  10 mg Oral TID AC Lovorn, Megan, MD   10 mg at 03/17/20 1310  . ondansetron (ZOFRAN) tablet 4 mg  4 mg Oral Q6H PRN Cathlyn Parsons, PA-C   4 mg at 03/14/20 2001   Or  . ondansetron [facility]) injection 4 mg  4 mg Intravenous Q6H PRN Angiulli, Lavon Paganini, PA-C      . oxyCODONE (Oxy IR/ROXICODONE) immediate release tablet 10 mg  10 mg Oral Q3H PRN Cathlyn Parsons, PA-C   10 mg at 03/15/20 0543  . senna (SENOKOT) tablet 8.6 mg  1 tablet Oral BID Cathlyn Parsons, PA-C   8.6 mg at 03/17/20 0809  . sorbitol 70 % solution 30 mL  30 mL Oral Daily PRN Cathlyn Parsons, PA-C   30 mL at 03/13/20 1353  . sulfamethoxazole-trimethoprim (BACTRIM DS) 800-160 MG per tablet 1 tablet  1 tablet Oral Q12H Lovorn, Megan, MD   1 tablet at 03/17/20 0809     Discharge Medications: Please see discharge summary for a list of discharge medications.  Relevant Imaging Results:  Relevant Lab Results:   Additional Information GD#:[id number]  Rana Snare, LCSW

## 2020-03-18 ENCOUNTER — Inpatient Hospital Stay (HOSPITAL_COMMUNITY): Payer: Medicare Other | Admitting: Physical Therapy

## 2020-03-18 ENCOUNTER — Inpatient Hospital Stay (HOSPITAL_COMMUNITY): Payer: Medicare Other | Admitting: Occupational Therapy

## 2020-03-18 LAB — CBC WITH DIFFERENTIAL/PLATELET
Abs Immature Granulocytes: 0.1 10*3/uL — ABNORMAL HIGH (ref 0.00–0.07)
Basophils Absolute: 0 10*3/uL (ref 0.0–0.1)
Basophils Relative: 0 %
Eosinophils Absolute: 0.4 10*3/uL (ref 0.0–0.5)
Eosinophils Relative: 3 %
HCT: 37.7 % — ABNORMAL LOW (ref 39.0–52.0)
Hemoglobin: 12.2 g/dL — ABNORMAL LOW (ref 13.0–17.0)
Immature Granulocytes: 1 %
Lymphocytes Relative: 12 %
Lymphs Abs: 1.6 10*3/uL (ref 0.7–4.0)
MCH: 27.5 pg (ref 26.0–34.0)
MCHC: 32.4 g/dL (ref 30.0–36.0)
MCV: 84.9 fL (ref 80.0–100.0)
Monocytes Absolute: 0.9 10*3/uL (ref 0.1–1.0)
Monocytes Relative: 7 %
Neutro Abs: 10.4 10*3/uL — ABNORMAL HIGH (ref 1.7–7.7)
Neutrophils Relative %: 77 %
Platelets: 264 10*3/uL (ref 150–400)
RBC: 4.44 MIL/uL (ref 4.22–5.81)
RDW: 13.5 % (ref 11.5–15.5)
WBC: 13.3 10*3/uL — ABNORMAL HIGH (ref 4.0–10.5)
nRBC: 0 % (ref 0.0–0.2)

## 2020-03-18 LAB — BASIC METABOLIC PANEL
Anion gap: 7 (ref 5–15)
BUN: 31 mg/dL — ABNORMAL HIGH (ref 8–23)
CO2: 26 mmol/L (ref 22–32)
Calcium: 9 mg/dL (ref 8.9–10.3)
Chloride: 102 mmol/L (ref 98–111)
Creatinine, Ser: 1.27 mg/dL — ABNORMAL HIGH (ref 0.61–1.24)
GFR calc Af Amer: >60 mL/min (ref >60)
GFR calc non Af Amer: >60 mL/min (ref >60)
Glucose, Bld: 169 mg/dL — ABNORMAL HIGH (ref 70–99)
Potassium: 5.1 mmol/L (ref 3.5–5.1)
Sodium: 135 mmol/L (ref 135–145)

## 2020-03-18 LAB — GLUCOSE, CAPILLARY
Glucose-Capillary: 145 mg/dL — ABNORMAL HIGH (ref 70–99)
Glucose-Capillary: 183 mg/dL — ABNORMAL HIGH (ref 70–99)
Glucose-Capillary: 165 mg/dL — ABNORMAL HIGH (ref 70–99)
Glucose-Capillary: 166 mg/dL — ABNORMAL HIGH (ref 70–99)
Glucose-Capillary: 149 mg/dL — ABNORMAL HIGH (ref 70–99)
Glucose-Capillary: 152 mg/dL — ABNORMAL HIGH (ref 70–99)

## 2020-03-18 MED ORDER — LABETALOL HCL 100 MG PO TABS
50.0000 mg | ORAL_TABLET | Freq: Two times a day (BID) | ORAL | Status: DC
  Administered 2020-03-18 – 2020-03-26 (×16): 50 mg via ORAL
  Filled 2020-03-18 (×16): qty 1

## 2020-03-18 MED ORDER — INSULIN ASPART 100 UNIT/ML ~~LOC~~ SOLN
0.0000 [IU] | Freq: Three times a day (TID) | SUBCUTANEOUS | Status: DC
  Administered 2020-03-18: 2 [IU] via SUBCUTANEOUS
  Administered 2020-03-19: 1 [IU] via SUBCUTANEOUS
  Administered 2020-03-20: 3 [IU] via SUBCUTANEOUS
  Administered 2020-03-20: 1 [IU] via SUBCUTANEOUS
  Administered 2020-03-20 – 2020-03-21 (×2): 2 [IU] via SUBCUTANEOUS
  Administered 2020-03-21: 1 [IU] via SUBCUTANEOUS
  Administered 2020-03-22 (×2): 2 [IU] via SUBCUTANEOUS
  Administered 2020-03-23: 1 [IU] via SUBCUTANEOUS
  Administered 2020-03-23: 2 [IU] via SUBCUTANEOUS
  Administered 2020-03-24: 3 [IU] via SUBCUTANEOUS
  Administered 2020-03-24: 1 [IU] via SUBCUTANEOUS
  Administered 2020-03-24 – 2020-03-25 (×2): 2 [IU] via SUBCUTANEOUS
  Administered 2020-03-25: 5 [IU] via SUBCUTANEOUS
  Administered 2020-03-25: 1 [IU] via SUBCUTANEOUS

## 2020-03-18 NOTE — Progress Notes (Signed)
Physical Therapy Session Note  Patient Details  Name: [name] MRN: [id number] Date of Birth: [birth year]  Today's Date: 03/18/2020 PT Individual Time: 0915-1025; 1400-1500 PT Individual Time Calculation (min): 70 min and 60 min  Short Term Goals: Week 1:  PT Short Term Goal 1 (Week 1): pt to demonstate min A x1 bed mobility PT Short Term Goal 2 (Week 1): pt to demonstrate min A x1 functional transfers with LRAD PT Short Term Goal 3 (Week 1): pt to demonstrate 8' with LRAD mod A x1 PT Short Term Goal 4 (Week 1): pt to demonstrate dynamic sitting balance for functional tasks at min A PT Short Term Goal 4 - Progress (Week 1): Not met  Skilled Therapeutic Interventions/Progress Updates:    Session 1: Pt received supine in bed, agreeable to PT session. No complaints of pain. Supine to sit with min A. Pt is min A to don scrub top while seated EOB. Assisted pt with threading BLE through pants while seated EOB. Sit to stand with max A to RW. Pt is able to pull pants up over hips with min A while standing with RW. Stand pivot transfer to w/c with RW and min A. Pt is setup A for oral hygiene and washing his face while seated in w/c at sink. Provided velcro to adjust soft collar for better fit as collar is currently too tight to fit on patient. Dependent transport via w/c to therapy gym for time conservation. Sit to stand in // bars with max A with cues for anterior weight shift. Forwards/backwards stepping with alt LE 3 x 10 reps with seated rest breaks between each bout of stepping. Ambulation 2 x 10 ft forwards/backwards in // bars with min A for balance and close w/c follow for safety. Pt exhibits intermittent ability to perform good foot clearance and step length while at other times exhibits shuffling gait pattern. Pt fatigues quickly with standing activity. Manual w/c propulsion x 50 ft with use of BUE and min A needed for steering. Pt tends to deviate to the R in w/c, unable to adequately  follow cues to correct. Pt also fatigues quickly with w/c propulsion and takes frequent rest breaks during propulsion x 50 ft. Sit to stand with mod A to RW from w/c. Stand pivot transfer w/c to recliner with RW and min A. Pt left seated in recliner in room with needs in reach, quick release belt and chair alarm in place at end of session.  Session 2: Pt received sidelying in bed asleep, arousable and agreeable to PT session. No complaints of pain. Supine to sit with min A for trunk elevation. Sit to stand with mod A to RW. Stand pivot transfer bed to w/c with RW and min A, pt shuffles feet during transfer. Dependent transport via w/c to/from therapy gym for time conservation. Sit to stand with mod to max A to RW. While in standing pt engaged in checkers game with focus on overall standing endurance. Pt tolerates standing 3 x 1 min before reporting fatigue too great to continuing with standing activity. Pt finished remainder of checkers game from seated position with cues to attend to task. Pt easily distracted and exhibits decreased ability to attend to task. Seated BUE and core strengthening performing ball toss against rebounder, 3 x 20 reps to fatigue. Pt agreeable to sit up in recliner at end of session. Sit to stand with mod A to RW. Stand pivot transfer to recliner with RW and min A. Pt  left seated in recliner in room with needs in reach, quick release belt and chair alarm in place at end of session.  Therapy Documentation Precautions:  Precautions Precautions: Cervical Precaution Booklet Issued: No Required Braces or Orthoses: Cervical Brace Cervical Brace: Soft collar (when up OOB) Restrictions Weight Bearing Restrictions: No    Therapy/Group: Individual Therapy   Excell Seltzer, PT, DPT  03/18/2020, 12:17 PM

## 2020-03-18 NOTE — Progress Notes (Signed)
Occupational Therapy Session Note  Patient Details  Name: [name] MRN: [id number] Date of Birth: [birth year]  Today's Date: 03/18/2020 OT Individual Time: 1107-1200 OT Individual Time Calculation (min): 53 min    Short Term Goals: Week 1:  OT Short Term Goal 1 (Week 1): Patient will don UB clothing with set-up assist. OT Short Term Goal 2 (Week 1): Patient will thread BLE through LB clothing with AE PRN. OT Short Term Goal 3 (Week 1): Patient will complete toilet transfers with Mod A. OT Short Term Goal 4 (Week 1): Patient will complete LB bathing will complete 1/3 parts of toileting.  Skilled Therapeutic Interventions/Progress Updates:    Pt received in bed and agreeable to therapy, although it took quite awhile to get him going as he was often saying he was "just so tired and cold". He would often divert the conversation delaying the start of treatment.  It took 4x to finally get pt to sit to EOB with mod A as he would start the process, roll to his side and then "flop" back stating "oh I just cant right now".  Once at the EOB, attempted several times to stand from elevated bed to RW.  Even with max A, pt not able to rise.  Obtained the stedy lift, with lots of cues pt was able to rise to stand with mod-max A.  Once he was up in standing, he was able to hold an upright position and practice swaying side to side.  He worked on stand to partial sit on stedy pads and could rise to stand with no A from that level. Worked on 2 sets of 8 stands and pt kept saying he was too tired and his neck was hurting.   Transferred pt to recliner.  Attempted UE AROM exercises, but it was hurting his neck too much to lift them to 50 degrees of abduction.  Continued to c/o neck pain but said it was not time for more medication.   Obtained hot pack for pt, applied to neck and he stated it was very helpful.  Checked on skin after 10 min, no adverse reactions. Pt kept hotpack on during lunch for 30  min.  Pt in recliner with belt alarm on and all needs met.  Therapy Documentation Precautions:  Precautions Precautions: Cervical Precaution Booklet Issued: No Required Braces or Orthoses: Cervical Brace Cervical Brace: Soft collar (when up OOB) Restrictions Weight Bearing Restrictions: No   Pain: Pain Assessment Pain Scale: 0-10 Pain Score: 5  Pain Location: Neck Pain Orientation: Posterior Pain Descriptors / Indicators: Aching Pain Onset: Gradual Pain Intervention(s): Heat applied ADL: ADL Eating: Set up Where Assessed-Eating: Bed level Grooming: Minimal assistance Where Assessed-Grooming: Sitting at sink Upper Body Bathing: Minimal assistance Where Assessed-Upper Body Bathing: Sitting at sink Lower Body Bathing: Maximal assistance Where Assessed-Lower Body Bathing: Sitting at sink Upper Body Dressing: Moderate assistance Where Assessed-Upper Body Dressing: Sitting at sink Lower Body Dressing: Maximal assistance Where Assessed-Lower Body Dressing: Sitting at sink   Therapy/Group: Individual Therapy  [place] 03/18/2020, 12:27 PM

## 2020-03-18 NOTE — Progress Notes (Signed)
Talked with Reesa Chew, MD about SSI coverage. She says since pt is on a actual diet, he should be ACHS coverage now. See new orders.  Bertram Millard, LPN.

## 2020-03-18 NOTE — Progress Notes (Signed)
Patient ID: [name], male   DOB: 11-13-1960, 59 y.o.   MRN: [id number]  SW sent out SNF referral.  SW provided pt with SNF list via [email or web address]. Preferred SNF is 1) [facility] and 2) [facility]. SW informed pt there will be follow once there is more information.  SW spoke with Nikki/Admissions with [facility] [phone]) to discuss referral. Reports will review referral; will need to know if pt has had COVID vaccine if able to accept.  SW waiting on follow-up.   SW spoke with Janie Holden/Admissions with [facility] [phone]) to discuss referral. Referral declined due to SA hx.   Loralee Pacas, MSW, [place] Office: [phone] Cell: [phone] Fax: [phone]

## 2020-03-18 NOTE — Progress Notes (Signed)
[facility] PHYSICAL MEDICINE & REHABILITATION PROGRESS NOTE   Subjective/Complaints:  Pt reports feels weaker this AM- unable to pull self up in bed quite the same way/as easily.  Feels a little stronger in his hands, but not his legs- they are weaker.   Does reports feeling somewhat sore, but not in pain- pain doing better overall.    ROS:   Pt denies SOB, abd pain, CP, N/V/C/D, and vision changes  Objective:   No results found. No results for input(s): WBC, HGB, HCT, PLT in the last 72 hours. No results for input(s): NA, K, CL, CO2, GLUCOSE, BUN, CREATININE, CALCIUM in the last 72 hours.  Intake/Output Summary (Last 24 hours) at 03/18/2020 0854 Last data filed at 03/18/2020 0700 Gross per 24 hour  Intake 0 ml  Output --  Net 0 ml     Physical Exam: Vital Signs Blood pressure 134/71, pulse (!) 57, temperature 98.7 F (37.1 C), resp. rate 18, height 6\' 3"  (1.905 m), weight 100.1 kg, SpO2 100 %.  Constitutional: pt supine in bed- appropriate, NAD HEENT: EOMI, oral membranes moist Neck: supple- staples intact- no erythema Cardiovascular: bradycardic- regular rhythm   Respiratory/Chest: CTA B/L- no W/R/R- good air movement GI/Abdomen: Soft, NT, ND, (+)BS  Ext: no clubbing, cyanosis, tr edema Psych: less joking- more serious Neuro:  Comments: UEs- biceps 5-/5, WE 4+/5, triceps 4-/5, grip 4-/5, finger abd 3+/5 B/L- no significant change LEs- R HF 4+/5; L HF 4/5- otherwise 4+/5 in KE, DF, PF and EHL B/L- appears ~ 4 to 4-/5 in LEs this AM   Ox3 Decreased sensation to light touch in L1 to S2 B/L- otherwise intact in UEs and torso B/L MAS of 1 in LUE and LEs- MAS of 0 in RUE--no changes Spasticity/clawing appearing of L>R hands- also notable is intrinsic muscle atrophy- L>R hands- less clawing notable this AM Skin:intact- staples in posterior neck- healing well- no erythema  Assessment/Plan: 1. 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  Physiatrist is providing close team supervision and 24 hour management of active medical problems listed below.  Physiatrist and rehab team continue to assess barriers to discharge/monitor patient progress toward functional and medical goals  Care Tool:  Bathing    Body parts bathed by patient: Right arm, Left arm, Chest, Abdomen, Front perineal area, Face, Right upper leg, Left upper leg, Right lower leg   Body parts bathed by helper: Left lower leg, Buttocks     Bathing assist Assist Level: Minimal Assistance - Patient > 75%     Upper Body Dressing/Undressing Upper body dressing   What is the patient wearing?: Pull over shirt    Upper body assist Assist Level: Maximal Assistance - Patient 25 - 49%    Lower Body Dressing/Undressing Lower body dressing      What is the patient wearing?: Incontinence brief, Pants     Lower body assist Assist for lower body dressing: Maximal Assistance - Patient 25 - 49%     Toileting Toileting Toileting Activity did not occur (Clothing management and hygiene only): Refused  Toileting assist Assist for toileting: Maximal Assistance - Patient 25 - 49% (standing in the [place])     Transfers Chair/bed transfer  Transfers assist     Chair/bed transfer assist level: Dependent - mechanical lift (stedy)     Locomotion Ambulation   Ambulation  assist      Assist level: Maximal Assistance - Patient 25 - 49% Assistive device: Walker-rolling Max distance: 5   Walk 10 feet activity   Assist  Walk 10 feet activity did not occur: Safety/medical concerns        Walk 50 feet activity   Assist Walk 50 feet with 2 turns activity did not occur: Safety/medical concerns         Walk 150 feet activity   Assist Walk 150 feet activity did not occur: Safety/medical concerns          Walk 10 feet on uneven surface  activity   Assist Walk 10 feet on uneven surfaces activity did not occur: Safety/medical concerns         Wheelchair     Assist Will patient use wheelchair at discharge?: Yes Type of Wheelchair: Manual    Wheelchair assist level: Minimal Assistance - Patient > 75% Max wheelchair distance: 100'    Wheelchair 50 feet with 2 turns activity    Assist        Assist Level: Minimal Assistance - Patient > 75%   Wheelchair 150 feet activity     Assist      Assist Level: Total Assistance - Patient < 25%   Blood pressure 134/71, pulse (!) 57, temperature 98.7 F (37.1 C), resp. rate 18, height 6\' 3"  (1.905 m), weight 100.1 kg, SpO2 100 %.  Medical Problem List and Plan: 1.Cervical myelopathysecondary to cervical spinal cord compression status post cervical laminectomy and medialfacetectomyC3-4 4-5 posterior cervical arthrodesis C3-C6. Cervical collar at all times? 8/20- pt was given soft collar- refuses to wear it -patient may Shower with covering incision -ELOS/Goals: min A to Mod I, 3 weeks 2. Antithrombotics: -DVT/anticoagulation: .  vascular study ok  8/21 lovenox   8/26- will need for 3 months total, since not walking -antiplatelet therapy: N/A 3. Pain Management:Neurontin 300 mg twice daily,baclofen 5 mg 3 times daily,Cymbalta 60 mg daily, oxycodone as needed   8/22 holding scheduled gabapentin and baclofen given ileus  8/23- will restart Baclofen 5 mg TID since having bad muscle spasms of hands L>R- explained to pt.   8/25- pain doing better/spasms better  8/26- haven't restart gabapentin- pt hasn't asked- so far.  4. Mood:Provide emotional support -antipsychotic agents: N/A 5. Neuropsych: This patientiscapable of making decisions on hisown behalf. 6. Skin/Wound Care:Routine skin checks 7. Fluids/Electrolytes/Nutrition:Routine in and outs with follow-up  chemistries 8. Hypertension. Hydralazine 25 mg twice daily, labetalol 100 mg twice daily, lisinopril 40 mg daily. Monitor with increased mobility  8/26- will decrease Labetalol to 50 mg BID since pulse is low 9. Diabetes mellitus with peripheral neuropathy. Hemoglobin A1c 7.1. NovoLog 12units 3 times daily with meals, Lantus insulin 55units daily. Check blood sugars before meals and at bedtime. Diabetic teaching CBG (last 3)  Recent Labs    03/17/20 2058 03/18/20 0621 03/18/20 0807  GLUCAP 146* 145* 183*    8/20-22- BG's have been very labile-   8/23- placed DM coordinator's consult  8/24- decreased Novolog and made SSI sensitive  8/26- BGs looking much better- con't regimen 10. CKD stage II. Creatinine baseline 1.46. Follow-up chemistries  8/20- Baseline is 1.4-   8/22 Cr 1 today, holding lasix for now  -kayexalate for hyperkalemia  8/25- recheck labs in AM  8/26- Labs not back yet- will monitor 11. Morbid obesity. BMI 30.09. Dietary follow-up 12. History of alcohol tobacco cocaine use. Providing counseling 13. Diastolic congestive heart failure. Lasix 40 mg daily.  Monitor with increased mobility   Filed Weights   03/15/20 0500 03/17/20 0306 03/18/20 0322  Weight: 106.3 kg 100.7 kg 100.1 kg    8/22 weights down  8/23- weight up 1 kg- in spite of pooping.  8/24- no weight today so far  8/25- Weight down 6 kg to 100.7 kg- ?  8/26- Weight 100.1 kg- doing well 14. Hyperlipidemia. Lipitor 15. Neurogenic bowel/ileus and bladder- goes every 1 week - which is likely the slowing of neurogenic bowel - need to improve speed; also has urinary urgency, which is also neurogenic- will monitor and treat as required.   8/20- gave Sorbitol to get pt to have BM today- has been 7-8 days- usually goes 1x/week  8/22 colonic ileus on xray and CT. No SBO  8/23- still has moderate stool burden- will give another dose Sorbitol and then change back to regular diet if poops. Also  change reglan to PO 10 mg TID-AC x 2 days- since lost IV access.   8/24- didn't have another BM- will give 1/2 bottle Mg citrate at noon and by dinner, change diet back to regular DM diet.   8/25- 2 large BMs yesterday- put back to regular/DM diet-    16. Spasticity- on Baclofen 5mg  TID- adequately controlled- mainly annoying, not painful- will monitor muscle spasms. 17. Hyperkalemia  8/20- Not on any K+ replacement- K+ is 5.1-max level of normal- will monitor closely.    8/22 kayexalate today 18. Leukocytosis-   8/20- on Decadron slow taper- WBC 20k-   will check U/A and Cx just in case- afebrile- but will monitor for sx's of infection.    8/21-22 ua +, ucx pending--> await results   -suspect ileus playing a role too  8/23- U/A (+) somewhat- U Cx growing >100k E Coli- WBC down to 16.6k  8/24- UTI- EColi- will start Bactrim for UTI- based on Cx's- Bactrim DS BID x 7 days.  8/25- no complaints of Sx's.     LOS: 7 days A FACE TO FACE EVALUATION WAS PERFORMED  Jerry Clyne 03/18/2020, 8:54 AM

## 2020-03-19 ENCOUNTER — Inpatient Hospital Stay (HOSPITAL_COMMUNITY): Payer: Medicare Other | Admitting: Occupational Therapy

## 2020-03-19 ENCOUNTER — Encounter (HOSPITAL_COMMUNITY): Payer: Medicare Other | Admitting: Psychology

## 2020-03-19 ENCOUNTER — Inpatient Hospital Stay (HOSPITAL_COMMUNITY): Payer: Medicare Other | Admitting: Physical Therapy

## 2020-03-19 LAB — GLUCOSE, CAPILLARY
Glucose-Capillary: 82 mg/dL (ref 70–99)
Glucose-Capillary: 163 mg/dL — ABNORMAL HIGH (ref 70–99)
Glucose-Capillary: 90 mg/dL (ref 70–99)
Glucose-Capillary: 141 mg/dL — ABNORMAL HIGH (ref 70–99)
Glucose-Capillary: 121 mg/dL — ABNORMAL HIGH (ref 70–99)

## 2020-03-19 MED ORDER — BISACODYL 10 MG RE SUPP
10.0000 mg | Freq: Every day | RECTAL | Status: DC
  Administered 2020-03-19 – 2020-03-25 (×7): 10 mg via RECTAL
  Filled 2020-03-19 (×7): qty 1

## 2020-03-19 MED ORDER — LIDOCAINE HCL URETHRAL/MUCOSAL 2 % EX GEL
1.0000 "application " | Freq: Every day | CUTANEOUS | Status: DC
  Administered 2020-03-20 – 2020-03-25 (×6): 1 via TOPICAL
  Filled 2020-03-19 (×6): qty 5

## 2020-03-19 MED ORDER — SODIUM ZIRCONIUM CYCLOSILICATE 10 G PO PACK
10.0000 g | PACK | Freq: Once | ORAL | Status: AC
  Administered 2020-03-19: 10 g via ORAL
  Filled 2020-03-19: qty 1

## 2020-03-19 NOTE — Progress Notes (Signed)
Occupational Therapy Weekly Progress Note  Patient Details  Name: [name] MRN: [id number] Date of Birth: February 15, 1961  Beginning of progress report period: March 12, 2020 End of progress report period: March 19, 2020  Today's Date: 03/19/2020 OT Individual Time: 1105-1200 OT Individual Time Calculation (min): 55 min    Patient has met 1 of 4 short term goals.  Pt is making gradual progress but is often not consistent and gets very fatigued easily.  He has limited shoulder AROM due to weakness and neck pain.  He often needs mod to max A with sit to stands.   Patient continues to demonstrate the following deficits: muscle weakness, muscle joint tightness and muscle paralysis, decreased cardiorespiratoy endurance, unbalanced muscle activation and decreased coordination and decreased sitting balance, decreased standing balance, decreased postural control and decreased balance strategies and therefore will continue to benefit from skilled OT intervention to enhance overall performance with BADL.  Patient not progressing toward long term goals.  See goal revision..  Plan of care revisions: .Marland Kitchen   Problem: RH Balance Goal: LTG Patient will maintain dynamic standing with ADLs (OT) Description: LTG:  Patient will maintain dynamic standing balance with assist during activities of daily living (OT)  Flowsheets (Taken 03/19/2020 1237) LTG: Pt will maintain dynamic standing balance during ADLs with: (goal downgraded for safety) Minimal Assistance - Patient > 75% Note: Goal downgraded for safety due to LE weakness.    Problem: Sit to Stand Goal: LTG:  Patient will perform sit to stand in prep for activites of daily living with assistance level (OT) Description: LTG:  Patient will perform sit to stand in prep for activites of daily living with assistance level (OT) Flowsheets (Taken 03/19/2020 1237) LTG: PT will perform sit to stand in prep for activites of daily living with assistance level: (Goal  downgraded for safety due to LE weakness.) Minimal Assistance - Patient > 75% Note: Goal downgraded for safety due to LE weakness.   Problem: RH Dressing Goal: LTG Patient will perform lower body dressing w/assist (OT) Description: LTG: Patient will perform lower body dressing with assist, with/without cues in positioning using equipment (OT) Flowsheets (Taken 03/19/2020 1237) LTG: Pt will perform lower body dressing with assistance level of: (Goal downgraded for safety due to LE weakness.) Minimal Assistance - Patient > 75% Note: Goal downgraded for safety due to LE weakness.   Problem: RH Toileting Goal: LTG Patient will perform toileting task (3/3 steps) with assistance level (OT) Description: LTG: Patient will perform toileting task (3/3 steps) with assistance level (OT)  Flowsheets (Taken 03/19/2020 1237) LTG: Pt will perform toileting task (3/3 steps) with assistance level: (Goal downgraded for safety due to LE weakness.) Moderate Assistance - Patient 50 - 74% Note: Goal downgraded for safety due to LE weakness.   Problem: RH Toilet Transfers Goal: LTG Patient will perform toilet transfers w/assist (OT) Description: LTG: Patient will perform toilet transfers with assist, with/without cues using equipment (OT) Flowsheets (Taken 03/19/2020 1237) LTG: Pt will perform toilet transfers with assistance level of: (Goal downgraded for safety due to LE weakness.) Minimal Assistance - Patient > 75% Note: Goal downgraded for safety due to LE weakness.   Problem: RH Tub/Shower Transfers Goal: LTG Patient will perform tub/shower transfers w/assist (OT) Description: LTG: Patient will perform tub/shower transfers with assist, with/without cues using equipment (OT) Flowsheets (Taken 03/19/2020 1237) LTG: Pt will perform tub/shower stall transfers with assistance level of: (Goal downgraded for safety due to LE weakness.) Moderate Assistance - Patient 50 -  74% Note: Goal downgraded for safety due to LE  weakness.     OT Short Term Goals Week 1:  OT Short Term Goal 1 (Week 1): Patient will don UB clothing with set-up assist. OT Short Term Goal 1 - Progress (Week 1): Met OT Short Term Goal 2 (Week 1): Patient will thread BLE through LB clothing with AE PRN. OT Short Term Goal 2 - Progress (Week 1): Progressing toward goal OT Short Term Goal 3 (Week 1): Patient will complete toilet transfers with Mod A. OT Short Term Goal 3 - Progress (Week 1): Progressing toward goal OT Short Term Goal 4 (Week 1): Patient will complete LB bathing will complete 1/3 parts of toileting. OT Short Term Goal 4 - Progress (Week 1): Progressing toward goal Week 2:  OT Short Term Goal 1 (Week 2): Pt will be able to consistently complete sit to stands with min A or less to increase independence with toileting. OT Short Term Goal 2 (Week 2): Pt will be able to manage clothing over hips with min A. OT Short Term Goal 3 (Week 2): Pt will be able to don pants over feet with min A using reacher. OT Short Term Goal 4 (Week 2): Pt will be able to sit to EOB with S.  Skilled Therapeutic Interventions/Progress Updates:    Pt seen this session to focus on bed mobility, general mobility of sit to stands, transfers (bed to recliner taking 10 steps), self care of b/d.   After completing those steps, attempted to have pt practice toilet transfers to [facility] but he was unable to stand from recliner even with max A.  He would push up part of the way and then just stop stating he could not do that.  Tried several times, pt did not need to toilet so moved onto a/arom exercises for BUE using dowel bar. Pt is not able to lift arms above 50 degrees without c/o neck/ shoulder pain.   Pt resting in recliner with belt alarm on and all needs met.   Therapy Documentation Precautions:  Precautions Precautions: Cervical Precaution Booklet Issued: No Required Braces or Orthoses: Cervical Brace Cervical Brace: Soft collar (when up  OOB) Restrictions Weight Bearing Restrictions: No    Pain: Pain Assessment Pain Scale: 0-10 Pain Score: 6  Pain Type: Acute pain Pain Location: Neck Pain Orientation: Posterior Pain Descriptors / Indicators: Aching Pain Onset: Gradual Pain Intervention(s): Repositioned ADL: ADL Eating: Set up Where Assessed-Eating: Bed level Grooming: Setup Where Assessed-Grooming: Sitting at sink Upper Body Bathing: Setup Where Assessed-Upper Body Bathing: Chair Lower Body Bathing: Maximal assistance Where Assessed-Lower Body Bathing: Chair Upper Body Dressing: Setup Where Assessed-Upper Body Dressing: Chair Lower Body Dressing: Maximal assistance Where Assessed-Lower Body Dressing: Chair Toileting: Maximal assistance Where Assessed-Toileting: Glass blower/designer: Maximal Print production planner Method: Stand pivot   Therapy/Group: Individual Therapy  Piatt 03/19/2020, 12:44 PM

## 2020-03-19 NOTE — Progress Notes (Signed)
Physical Therapy Session Note  Patient Details  Name: [name] MRN: [id number] Date of Birth: 05/11/1961  Today's Date: 03/19/2020 PT Individual Time: 1415-1505 PT Individual Time Calculation (min): 50 min  PT Missed Time: 25 min Missed Time Reason: patient fatigue   Short Term Goals: Week 1:  PT Short Term Goal 1 (Week 1): pt to demonstate min A x1 bed mobility PT Short Term Goal 2 (Week 1): pt to demonstrate min A x1 functional transfers with LRAD PT Short Term Goal 3 (Week 1): pt to demonstrate 30' with LRAD mod A x1 PT Short Term Goal 4 (Week 1): pt to demonstrate dynamic sitting balance for functional tasks at min A PT Short Term Goal 4 - Progress (Week 1): Not met  Skilled Therapeutic Interventions/Progress Updates:    Pt received sidelying in bed, agreeable with encouragement to participate in therapy session. No complaints of pain. Sidelying to sitting EOB with min A for trunk elevation. Sit to stand with max A to RW from elevated bed. Stand pivot transfer to w/c with RW and min A. Dependent transport via w/c to therapy gym for time conservation. Sit to stand in // bars with max A with increased time and cues needed to complete transfer with focus on anterior weight shift. Ambulation 3 x 20 ft in // bars with min A for balance. Pt tends to shuffle feet during gait, focus on heel/toe gait pattern for improved gait mechanics. Pt exhibits improved gait pattern with focus on heel strike. Pt requests to return to room following gait training due to fatigue. Manual w/c propulsion x 50 ft at Supervision level before onset of fatigue. Attempt to stand to RW, unable to achieve standing due to fatigue. Sit to stand to stedy with total A. Stedy transfer back to bed. Sit to supine Supervision. Pt left supine in bed with needs in reach, bed alarm in place at end of session. Pt missed 25 min of scheduled therapy session due to fatigue.   Therapy Documentation Precautions:   Precautions Precautions: Cervical Precaution Booklet Issued: No Required Braces or Orthoses: Cervical Brace Cervical Brace: Soft collar (when up OOB) Restrictions Weight Bearing Restrictions: No    Therapy/Group: Individual Therapy   Taylor Turkalo, PT, DPT  03/19/2020, 3:14 PM  

## 2020-03-19 NOTE — Progress Notes (Signed)
[place] PHYSICAL MEDICINE & REHABILITATION PROGRESS NOTE   Subjective/Complaints:  Pt reports the weakness he felt yesterday is better- Back to "normal" today-   Notes had a LOT of incontinent stools last night- one after the other- but no control- admits hasn't had control of stools lately, at all- wasn't clear if since before surgery or surgery itself.   Talked to Neuropsych- doesn't seem/appear to understand that he might not walk again, even though discussed with pt myself a few days ago and he remembered that we discussed it.     ROS:   Pt denies SOB, abd pain, CP, N/V/C/D, and vision changes   Objective:   No results found. Recent Labs    03/18/20 1046  WBC 13.3*  HGB 12.2*  HCT 37.7*  PLT 264   Recent Labs    03/18/20 1046  NA 135  K 5.1  CL 102  CO2 26  GLUCOSE 169*  BUN 31*  CREATININE 1.27*  CALCIUM 9.0    Intake/Output Summary (Last 24 hours) at 03/19/2020 4332 Last data filed at 03/19/2020 0700 Gross per 24 hour  Intake 120 ml  Output --  Net 120 ml     Physical Exam: Vital Signs Blood pressure (!) 143/69, pulse (!) 58, temperature 98.3 F (36.8 C), resp. rate 18, height 6\' 3"  (1.905 m), weight 98.3 kg, SpO2 100 %.  Constitutional: pt sleepy in bed- appropriate, NAD HEENT: EOMI, oral membranes moist Neck: supple- staples intact- no erythema Cardiovascular: bradycardic- regular rhythm Respiratory/Chest: CTA B/L- no W/R/R- good air movement GI/Abdomen: Soft, NT, ND, (+)BS  Ext: no clubbing, cyanosis, tr edema Psych:joking again- watching TV Neuro:  Comments: UEs- biceps 5-/5, WE 4+/5, triceps 4-/5, grip 4-/5, finger abd 3+/5 B/L- appears the same today, in spite of saying felt weaker yesterday and stronger today LEs- R HF 4+/5; L HF 4/5- otherwise 4+/5 in KE, DF, PF and EHL B/L- back to baseline B/L   Ox3 Decreased sensation to light touch in L1 to S2 B/L- otherwise intact in UEs and torso B/L MAS of 1 in LUE and LEs- MAS of 0 in  RUE--no changes Spasticity/clawing appearing of L>R hands- also notable is intrinsic muscle atrophy- L>R hands- less clawing notable this AM Skin:intact- staples in posterior neck- healing well- no erythema  Assessment/Plan: 1. 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  Physiatrist is providing close team supervision and 24 hour management of active medical problems listed below.  Physiatrist and rehab team continue to assess barriers to discharge/monitor patient progress toward functional and medical goals  Care Tool:  Bathing    Body parts bathed by patient: Right arm, Left arm, Chest, Abdomen, Front perineal area, Face, Right upper leg, Left upper leg, Right lower leg   Body parts bathed by helper: Left lower leg, Buttocks     Bathing assist Assist Level: Minimal Assistance - Patient > 75%     Upper Body Dressing/Undressing Upper body dressing   What is the patient wearing?: Pull over shirt    Upper body assist Assist Level: Maximal Assistance - Patient 25 - 49%    Lower Body Dressing/Undressing Lower body dressing      What is the patient wearing?: Incontinence brief, Pants     Lower body assist Assist for lower body dressing: Maximal Assistance - Patient 25 - 49%  Toileting Toileting Toileting Activity did not occur Landscape architect and hygiene only): Refused  Toileting assist Assist for toileting: Maximal Assistance - Patient 25 - 49% (standing in the [place])     Transfers Chair/bed transfer  Transfers assist     Chair/bed transfer assist level: Minimal Assistance - Patient > 75%     Locomotion Ambulation   Ambulation assist      Assist level: 2 helpers Assistive device: Parallel bars Max distance: 10'   Walk 10 feet activity   Assist  Walk 10 feet activity did  not occur: Safety/medical concerns  Assist level: 2 helpers Assistive device: Walker-rolling   Walk 50 feet activity   Assist Walk 50 feet with 2 turns activity did not occur: Safety/medical concerns         Walk 150 feet activity   Assist Walk 150 feet activity did not occur: Safety/medical concerns         Walk 10 feet on uneven surface  activity   Assist Walk 10 feet on uneven surfaces activity did not occur: Safety/medical concerns         Wheelchair     Assist Will patient use wheelchair at discharge?: Yes Type of Wheelchair: Manual    Wheelchair assist level: Minimal Assistance - Patient > 75% Max wheelchair distance: 45'    Wheelchair 50 feet with 2 turns activity    Assist        Assist Level: Minimal Assistance - Patient > 75%   Wheelchair 150 feet activity     Assist      Assist Level: Total Assistance - Patient < 25%   Blood pressure (!) 143/69, pulse (!) 58, temperature 98.3 F (36.8 C), resp. rate 18, height 6\' 3"  (1.905 m), weight 98.3 kg, SpO2 100 %.  Medical Problem List and Plan: 1.Cervical myelopathysecondary to cervical spinal cord compression status post cervical laminectomy and medialfacetectomyC3-4 4-5 posterior cervical arthrodesis C3-C6. Cervical collar at all times? 8/20- pt was given soft collar- refuses to wear it -patient may Shower with covering incision -ELOS/Goals: min A to Mod I, 3 weeks 2. Antithrombotics: -DVT/anticoagulation: .  vascular study ok  8/21 lovenox   8/26- will need for 3 months total, since not walking -antiplatelet therapy: N/A 3. Pain Management:Neurontin 300 mg twice daily,baclofen 5 mg 3 times daily,Cymbalta 60 mg daily, oxycodone as needed   8/22 holding scheduled gabapentin and baclofen given ileus  8/23- will restart Baclofen 5 mg TID since having bad muscle spasms of hands L>R- explained to pt.   8/25- pain doing better/spasms  better  8/26- haven't restart gabapentin- pt hasn't asked- so far.  4. Mood:Provide emotional support -antipsychotic agents: N/A 5. Neuropsych: This patientiscapable of making decisions on hisown behalf. 6. Skin/Wound Care:Routine skin checks 7. Fluids/Electrolytes/Nutrition:Routine in and outs with follow-up chemistries 8. Hypertension. Hydralazine 25 mg twice daily, labetalol 100 mg twice daily, lisinopril 40 mg daily. Monitor with increased mobility  8/26- will decrease Labetalol to 50 mg BID since pulse is low  8/27- Pulse slightly better- 58 this AM- will monitor 9. Diabetes mellitus with peripheral neuropathy. Hemoglobin A1c 7.1. NovoLog 12units 3 times daily with meals, Lantus insulin 55units daily. Check blood sugars before meals and at bedtime. Diabetic teaching CBG (last 3)  Recent Labs    03/18/20 2013 03/19/20 0559 03/19/20 0742  GLUCAP 152* 82 163*    8/20-22- BG's have been very labile-   8/27- BGs doing better- con't regimen 10. CKD stage II. Creatinine baseline 1.46. Follow-up chemistries  8/20- Baseline is 1.4-   8/22 Cr 1 today, holding lasix for now  -kayexalate for hyperkalemia  8/25- recheck labs in AM  8/26- Labs not back yet- will monitor  8/27- Cr 1.27- which is around baseline; K+ 5.1- will give some Lokelma again.  11. Morbid obesity. BMI 30.09. Dietary follow-up 12. History of alcohol tobacco cocaine use. Providing counseling 13. Diastolic congestive heart failure. Lasix 40 mg daily. Monitor with increased mobility   Filed Weights   03/17/20 0306 03/18/20 0322 03/19/20 0558  Weight: 100.7 kg 100.1 kg 98.3 kg    8/22 weights down  8/23- weight up 1 kg- in spite of pooping.  8/27- Weight 98.7 kg- stable 14. Hyperlipidemia. Lipitor 15. Neurogenic bowel/ileus and bladder- goes every 1 week - which is likely the slowing of neurogenic bowel - need to improve speed; also has urinary urgency, which is also neurogenic-  will monitor and treat as required.   8/20- gave Sorbitol to get pt to have BM today- has been 7-8 days- usually goes 1x/week  8/22 colonic ileus on xray and CT. No SBO  8/23- still has moderate stool burden- will give another dose Sorbitol and then change back to regular diet if poops. Also change reglan to PO 10 mg TID-AC x 2 days- since lost IV access.   8/24- didn't have another BM- will give 1/2 bottle Mg citrate at noon and by dinner, change diet back to regular DM diet.   8/25- 2 large BMs yesterday- put back to regular/DM diet-  8/27- will place on bowel program, OK with pt- will add lidocaine jelly as well    16. Spasticity- on Baclofen 5mg  TID- adequately controlled- mainly annoying, not painful- will monitor muscle spasms. 17. Hyperkalemia  8/20- Not on any K+ replacement- K+ is 5.1-max level of normal- will monitor closely.    8/22 kayexalate today 18. Leukocytosis-   8/20- on Decadron slow taper- WBC 20k-   will check U/A and Cx just in case- afebrile- but will monitor for sx's of infection.    8/21-22 ua +, ucx pending--> await results   -suspect ileus playing a role too  8/23- U/A (+) somewhat- U Cx growing >100k E Coli- WBC down to 16.6k  8/24- UTI- EColi- will start Bactrim for UTI- based on Cx's- Bactrim DS BID x 7 days.  8/25- no complaints of Sx's.   8/27- WBC down to 13.3k    LOS: 8 days A FACE TO FACE EVALUATION WAS PERFORMED  Itzamara Casas 03/19/2020, 9:27 AM

## 2020-03-19 NOTE — Progress Notes (Addendum)
Pt educated about new order for bowel program and suppository given. Bowel stimulation performed. Pt tolerated well. Small amount of soft brown stool noted to glove.

## 2020-03-19 NOTE — Progress Notes (Signed)
Patient ID: [name], male   DOB: January 28, 1961, 59 y.o.   MRN: [id number]  SW spoke with Nikki/Admissions with [facility] [phone]) to discuss referral. Reports if pt has had COVID vaccine possible bed available today, if pt requires isolation no beds available for atleast a week. SW to follow-up after speaking with pt and medical team.  Pt reports he has had COVID vaccine but cards are at home. Per attending, pt just began bowel program and not ready for discharge. SW spoke with Nikki/Admissions with [facility] to inform on above. SW to follow-up early next week to discuss if bed available.   Loralee Pacas, MSW, [place] Office: [phone] Cell: [phone] Fax: [phone]

## 2020-03-19 NOTE — Progress Notes (Signed)
Physical Therapy Weekly Progress Note  Patient Details  Name: [name] MRN: [id number] Date of Birth: 1960-09-09  Beginning of progress report period: March 12, 2020 End of progress report period: March 19, 2020  Today's Date: 03/19/2020 PT Individual Time: 0900-1000 PT Individual Time Calculation (min): 60 min   Patient has met 1 of 4 short term goals, however pt has demonstrated improved and progression to 4/4 STGs, pt has not consistently met 4/4. Pt has initiated gait training and progressed to safely transferring with therapy with a RW and initiating gait with RW. Pt has made slow progress and limited 2/2 endurance, weakness, instability and would continue to benefit from skilled PT services at this time.  Patient continues to demonstrate the following deficits muscle weakness, decreased cardiorespiratoy endurance and decreased sitting balance, decreased standing balance, decreased postural control and decreased balance strategies and therefore will continue to benefit from skilled PT intervention to increase functional independence with mobility.  Patient progressing toward long term goals..  Continue plan of care.  PT Short Term Goals Week 1:  PT Short Term Goal 1 (Week 1): pt to demonstate min A x1 bed mobility PT Short Term Goal 2 (Week 1): pt to demonstrate min A x1 functional transfers with LRAD PT Short Term Goal 3 (Week 1): pt to demonstrate 23' with LRAD mod A x1 PT Short Term Goal 4 (Week 1): pt to demonstrate dynamic sitting balance for functional tasks at min A PT Short Term Goal 4 - Progress (Week 1): Not met  Skilled Therapeutic Interventions/Progress Updates:    pt received in bed and agreeable to therapy, pt requested to don pants, min A in supine to loop BLE in pants legs to complete. Pt directed in rolling R and L x2 to complete donning pants over hips (+) use of bed rails. Pt directed in supine>sit with log rolling to pt's L, mod A to come to full sitting with  single step VC to complete. Soft collar donned at EOB total A. Pt directed in stand pivot with RW from EOB to WC, min A with VC for technique and increased anterior momentum and bed height to achieve. Pt directed in self propelling WC min A with straight paths,  mod A for turns for obstacle navigation for 50' and x2 rest breaks at this distance, additional 9' with x2 rest breaks, frequent VC for attention to task. Pt directed in gait training with RW for 30' x2 with one sitting rest break, wheel chair follow for safety mod A grossly for stability and consistent VC for increased step height on BLE, increased stride length and trunk extension. Pt directed in x5 STS from [name] to RW at min A for first four reps and mod A on final rep with x3 attempts to come to standing, VC for technique and setup, with each stand pt directed in static standing at RW at min A-CGA for 45s each to increase standing tolerance and stability. Pt returned to room in [facility], total A for time management and left in chair, alarm belt set, All needs in reach and in good condition. Call light in hand.    Therapy Documentation Precautions:  Precautions Precautions: Cervical Precaution Booklet Issued: No Required Braces or Orthoses: Cervical Brace Cervical Brace: Soft collar (when up OOB) Restrictions Weight Bearing Restrictions: No General: PT Amount of Missed Time (min): 25 Minutes PT Missed Treatment Reason: Patient fatigue Vital Signs: Therapy Vitals Temp: 98.2 F (36.8 C) Pulse Rate: 78 Resp: 16 BP: Marland Kitchen)  141/56 Patient Position (if appropriate): Sitting Oxygen Therapy SpO2: 99 % O2 Device: Room Air Pain: Pain Assessment Pain Scale: 0-10 Pain Score: 6  Pain Type: Acute pain Pain Location: Neck Pain Orientation: Posterior Pain Descriptors / Indicators: Aching Pain Onset: Gradual Pain Intervention(s): Repositioned   Therapy/Group: Individual Therapy  [name] 03/19/2020, 3:27 PM

## 2020-03-19 NOTE — Plan of Care (Signed)
  Problem: Consults Goal: RH GENERAL PATIENT EDUCATION Description: See Patient Education module for education specifics. Outcome: Progressing Goal: Skin Care Protocol Initiated - if Braden Score 18 or less Description: If consults are not indicated, leave blank or document N/A Outcome: Progressing Goal: Nutrition Consult-if indicated Outcome: Progressing Goal: Diabetes Guidelines if Diabetic/Glucose > 140 Description: If diabetic or lab glucose is > 140 mg/dl - Initiate Diabetes/Hyperglycemia Guidelines & Document Interventions  Outcome: Progressing   Problem: RH BOWEL ELIMINATION Goal: RH STG MANAGE BOWEL WITH ASSISTANCE Description: STG Manage Bowel with mod Assistance. Outcome: Progressing Goal: RH STG MANAGE BOWEL W/MEDICATION W/ASSISTANCE Description: STG Manage Bowel with Medication with mod Assistance. Outcome: Progressing

## 2020-03-19 NOTE — Consult Note (Signed)
Neuropsychological Consultation   Patient:   [name]   DOB:   [birth year]  MR Number:  [id number]  Location:  [facility] [address] [facility] [place] [id number] [place] [address] Dept: [place]: [phone]           Date of Service:   03/19/2020  Start Time:   8 AM End Time:   9 AM  Provider/Observer:  Ilean Skill, Psy.D.       Clinical Neuropsychologist       Billing Code/Service: [phone]  Chief Complaint:    [name] is a 59 year old male with history of alcohol/tobacco abuse, prior cocaine abuse complicated by STEMI in past.  BPH, chronic pain of lower back, CKD stage II, diastolic congestive heart failure, type 2 diabetes, hyperlipidemia, morbid obesity, congenital hydrocephalus, history of spinal cord contusion/myelopathy/central cord syndrome at C5-6 after a fall with residual left-sided weakness 2015.  Patient has been followed by Dr. Ronnald Ramp with conservative care.  Presented 02/26/2020 with progressive weakness/gait instability and lower extremities left greater than right as well as numbness and spasticity with stool incontinence.  Imaging revealed severe spinal stenosis/myelopathy at C3-4, C4-5.  Patient underwent decompressive cervical laminectomy and medial facetectomy C3-4 4-5 with posterior cervical arthrodesis C3-C6 with lateral mass fixation per Dr. Ronnald Ramp.  Patient admitted to the comprehensive rehab program.  Reason for Service:  Patient was referred for neuropsychological consultation due to coping and adjustment issues with residual motor deficits and residual sensory deficits primarily in his legs.  Below see HPI for the current admission.  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 Sherley Bounds with conservative care. Per chart review patient lives alone. 1 level apartment. Has an aide 3 days a week for 2 hours at a time used a walker and cane prior to admission. Aide does assist with some ADLs. Presented 02/26/2020 progressive weakness/gait instability lower extremities left greater than right as well as numbness and spasticity with stool incontinence. Admission chemistries unremarkable except glucose 209, BUN 26, creatinine 1.46, hemoglobin 14.7, hemoglobin A1c 7.1, SARS coronavirus negative. Recent CTs and brain MRIs demonstrated diffuse atrophy negative for acute infarct. X-rays and imaging revealed severe spinal stenosis/myelopathy at C3-4, C4-5. Patient underwent decompressive cervical laminectomy and medial facetectomyC3-4 4-5 with posterior cervical arthrodesis C3-C6 with lateral mass fixation 03/05/2020 per Dr. Sherley Bounds. Cervical soft collar as directed. Decadron protocol initiated. Tolerating a regular consistency diet. Therapy evaluations completed and patient was admitted for a comprehensive rehab program.  Current Status:  The patient was oriented with good mental status.  However, the patient displayed only a loose grasp of everything that had transpired with him medically and what the expected changes over the next week or so arm.  It is unclear how much longstanding cognitive limitations are present with his history of congenital hydrocephalus.  He does have abnormalities on recent MRI including chronically dilated lateral ventricles likely due to chronic compensated obstructive hydrocephalus.  There were also findings of generalized atrophy and bilateral chronic microvascular ischemia.  The patient did appear to have some difficulty grasping the  complexities of his medical status  although he is remembering aspects of these descriptions.  The patient had some confusion differentiating his pre-existing neuropathy in his feet versus changes following spinal cord stenosis and decompressive surgery.  Patient denied any significant depressive or anxiety based symptoms that were limiting therapy.  The patient has been participating in therapy and is making functional gains in overall status.  There does not appear to be any significant level of depression but his premorbid intellectual challenges are likely playing a role in his complete understanding of everything that is going on.    Behavioral Observation: [name]  presents as a 59 y.o.-year-old Right African American Male who appeared his stated age. his dress was Appropriate and he was Well Groomed and his manners were Appropriate to the situation.  his participation was indicative of Inattentive and Redirectable behaviors.  There were any physical disabilities noted.  he displayed an appropriate level of cooperation and motivation.     Interactions:    Active Appropriate and Redirectable  Attention:   abnormal and attention span appeared shorter than expected for age  Memory:   abnormal; remote memory intact, recent memory impaired  Visuo-spatial:  not examined  Speech (Volume):  low  Speech:   normal; normal  Thought Process:  Coherent and Circumstantial  Though Content:  WNL; not suicidal and not homicidal  Orientation:   person, place and time/date  Judgment:   Poor  Planning:   Poor  Affect:    Blunted and Lethargic  Mood:    Dysphoric  Insight:   Shallow  Intelligence:   low  Substance Use:  There is a documented history of alcohol and cocaine abuse confirmed by the patient.  Patient no longer using cocaine.    Medical History:   Past Medical History:  Diagnosis Date  . Alcohol abuse    11-15-2017  per pt last alcohol Dec 2018  . BPH with  obstruction/lower urinary tract symptoms   . Chronic arm pain   . Chronic pain    arms, leg, back  . CKD (chronic kidney disease), stage II   . Cocaine abuse ([place])    11-15-2017  per pt last used March 2019  . Congenital hydrocephalus, unspecified (HCC)    slow progression  . Diabetic retinopathy of both eyes ([place])   . Diastolic CHF, chronic ([place]) 07/2017  . Gait instability    multifactorial -- slow worsening congenital hydrocephalus, peripheral neuropathy, left C5-6 cord lesion  . Hematuria   . History of acute pulmonary edema 07/2017  . History of spinal cord injury 01/02/2014   pt fell, caused spinal cord contusion at C5-6--  residual left side weakness    . 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 History of TIA (transient ischemic attack) and stroke    hx cva 12-20-2010 and  TIA 02-22-2011--- no residual's from cva or tia  . Hyperlipidemia   . Hypertension    followed by pcp  . 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 Lesion of bladder   . Peripheral edema    chronic LUE  . Peripheral neuropathy   . Type 2 diabetes mellitus ([place])    followed by dr Karle Plumber--  last A1c 6.8 on 11-06-2017  . Weak urinary stream    Psychiatric History:  Prior substance abuse  Family Med/Psych History:  Family History  Problem Relation Age of Onset  . Hypertension Mother   .  Diabetes Mother   . Cancer Mother        breast cancer   . Hypertension Father   . Heart disease Father   . Stroke Father   . Hypertension Sister     Impression/DX:  [name] is a 59 year old male with history of alcohol/tobacco abuse, prior cocaine abuse complicated by STEMI in past.  BPH, chronic pain of lower back, CKD stage II, diastolic congestive heart failure, type 2 diabetes, hyperlipidemia, morbid obesity, congenital hydrocephalus, history of spinal cord contusion/myelopathy/central cord syndrome  at C5-6 after a fall with residual left-sided weakness 2015.  Patient has been followed by Dr. Ronnald Ramp with conservative care.  Presented 02/26/2020 with progressive weakness/gait instability and lower extremities left greater than right as well as numbness and spasticity with stool incontinence.  Imaging revealed severe spinal stenosis/myelopathy at C3-4, C4-5.  Patient underwent decompressive cervical laminectomy and medial facetectomy C3-4 4-5 with posterior cervical arthrodesis C3-C6 with lateral mass fixation per Dr. Ronnald Ramp.  Patient admitted to the comprehensive rehab program.  The patient was oriented with good mental status.  However, the patient displayed only a loose grasp of everything that had transpired with him medically and what the expected changes over the next week or so arm.  It is unclear how much longstanding cognitive limitations are present with his history of congenital hydrocephalus.  He does have abnormalities on recent MRI including chronically dilated lateral ventricles likely due to chronic compensated obstructive hydrocephalus.  There were also findings of generalized atrophy and bilateral chronic microvascular ischemia.  The patient did appear to have some difficulty grasping the complexities of his medical status although he is remembering aspects of these descriptions.  The patient had some confusion differentiating his pre-existing neuropathy in his feet versus changes following spinal cord stenosis and decompressive surgery.  Patient denied any significant depressive or anxiety based symptoms that were limiting therapy.  The patient has been participating in therapy and is making functional gains in overall status.  There does not appear to be any significant level of depression but his premorbid intellectual challenges are likely playing a role in his complete understanding of everything that is going on.          Electronically Signed   _______________________ Ilean Skill, Psy.D.

## 2020-03-19 NOTE — Plan of Care (Signed)
  Problem: RH Balance Goal: LTG Patient will maintain dynamic standing with ADLs (OT) Description: LTG:  Patient will maintain dynamic standing balance with assist during activities of daily living (OT)  Flowsheets (Taken 03/19/2020 1237) LTG: Pt will maintain dynamic standing balance during ADLs with: (goal downgraded for safety) Minimal Assistance - Patient > 75% Note: Goal downgraded for safety due to LE weakness.    Problem: Sit to Stand Goal: LTG:  Patient will perform sit to stand in prep for activites of daily living with assistance level (OT) Description: LTG:  Patient will perform sit to stand in prep for activites of daily living with assistance level (OT) Flowsheets (Taken 03/19/2020 1237) LTG: PT will perform sit to stand in prep for activites of daily living with assistance level: (Goal downgraded for safety due to LE weakness.) Minimal Assistance - Patient > 75% Note: Goal downgraded for safety due to LE weakness.   Problem: RH Dressing Goal: LTG Patient will perform lower body dressing w/assist (OT) Description: LTG: Patient will perform lower body dressing with assist, with/without cues in positioning using equipment (OT) Flowsheets (Taken 03/19/2020 1237) LTG: Pt will perform lower body dressing with assistance level of: (Goal downgraded for safety due to LE weakness.) Minimal Assistance - Patient > 75% Note: Goal downgraded for safety due to LE weakness.   Problem: RH Toileting Goal: LTG Patient will perform toileting task (3/3 steps) with assistance level (OT) Description: LTG: Patient will perform toileting task (3/3 steps) with assistance level (OT)  Flowsheets (Taken 03/19/2020 1237) LTG: Pt will perform toileting task (3/3 steps) with assistance level: (Goal downgraded for safety due to LE weakness.) Moderate Assistance - Patient 50 - 74% Note: Goal downgraded for safety due to LE weakness.   Problem: RH Toilet Transfers Goal: LTG Patient will perform toilet transfers  w/assist (OT) Description: LTG: Patient will perform toilet transfers with assist, with/without cues using equipment (OT) Flowsheets (Taken 03/19/2020 1237) LTG: Pt will perform toilet transfers with assistance level of: (Goal downgraded for safety due to LE weakness.) Minimal Assistance - Patient > 75% Note: Goal downgraded for safety due to LE weakness.   Problem: RH Tub/Shower Transfers Goal: LTG Patient will perform tub/shower transfers w/assist (OT) Description: LTG: Patient will perform tub/shower transfers with assist, with/without cues using equipment (OT) Flowsheets (Taken 03/19/2020 1237) LTG: Pt will perform tub/shower stall transfers with assistance level of: (Goal downgraded for safety due to LE weakness.) Moderate Assistance - Patient 50 - 74% Note: Goal downgraded for safety due to LE weakness.

## 2020-03-20 ENCOUNTER — Inpatient Hospital Stay (HOSPITAL_COMMUNITY): Payer: Medicare Other | Admitting: Physical Therapy

## 2020-03-20 LAB — GLUCOSE, CAPILLARY
Glucose-Capillary: 136 mg/dL — ABNORMAL HIGH (ref 70–99)
Glucose-Capillary: 204 mg/dL — ABNORMAL HIGH (ref 70–99)
Glucose-Capillary: 177 mg/dL — ABNORMAL HIGH (ref 70–99)
Glucose-Capillary: 132 mg/dL — ABNORMAL HIGH (ref 70–99)

## 2020-03-20 MED ORDER — INSULIN GLARGINE 100 UNIT/ML ~~LOC~~ SOLN
46.0000 [IU] | Freq: Every day | SUBCUTANEOUS | Status: DC
  Administered 2020-03-21 – 2020-03-23 (×3): 46 [IU] via SUBCUTANEOUS
  Filled 2020-03-20 (×4): qty 0.46

## 2020-03-20 MED ORDER — OXYCODONE HCL 5 MG PO TABS
5.0000 mg | ORAL_TABLET | ORAL | Status: DC | PRN
  Administered 2020-03-20 – 2020-03-25 (×9): 5 mg via ORAL
  Filled 2020-03-20 (×10): qty 1

## 2020-03-20 NOTE — Progress Notes (Signed)
Suppository given with Lidocaine. Bowel stimulation performed. Pt tolerated well. Small amount of soft brown stool noted to glove.

## 2020-03-20 NOTE — Progress Notes (Signed)
Physical Therapy Session Note  Patient Details  Name: [name] MRN: [id number] Date of Birth: 1961-06-15  Today's Date: 03/20/2020 PT Individual Time: 1016-1105 PT Individual Time Calculation (min): 49 min   Short Term Goals: Week 2:  PT Short Term Goal 1 (Week 2): pt to demonstrate dynamic sitting balance for functional tasks at min A PT Short Term Goal 2 (Week 2): pt to demonstrate min A x1 functional transfers with LRAD consistently PT Short Term Goal 3 (Week 2): pt to demonstate min A x1 bed mobility consistently PT Short Term Goal 4 (Week 2): pt to demonstrate gait training with LRAD for 75' min A  Skilled Therapeutic Interventions/Progress Updates: Pt presented in recliner agreeable to therapy. Pt states minimal pain. Pt performed STS from recliner with minA and mod multimodal cues for increasing anterior lean to shift COM to facilitate transfer. Pt then performed stand pivot with RW and minA to w/c. Pt transported to rehab gym and participated in gait training 75f x 2 with minA on straight path and modA during turns. Pt required increased time between bouts due to fatigue. Pt then participated in x 2 bouts of horseshoes for standing tolerance and continued practice for STS. Pt then indicated bladder urgency. Pt transported back to room and provided urinal, pt however was unable to void. Pt performed Stand pivot transfer w/c to recliner with pt able to stand with minA but poor eccentric control to sit in recliner. Pt left in recliner at end of session with belt alarm on, call bell within reach and needs met.      Therapy Documentation Precautions:  Precautions Precautions: Cervical Precaution Booklet Issued: No Required Braces or Orthoses: Cervical Brace Cervical Brace: Soft collar (when up OOB) Restrictions Weight Bearing Restrictions: No General:   Vital Signs: Therapy Vitals Temp: 98.1 F (36.7 C) Pulse Rate: 74 Resp: 16 BP: (!) 136/59 Patient Position (if  appropriate): Lying Oxygen Therapy SpO2: 100 % O2 Device: Room Air Pain: Pain Assessment Pain Scale: 0-10 Pain Score: 3  Pain Type: Acute pain Pain Location: Neck Pain Orientation: Posterior Pain Descriptors / Indicators: Aching Pain Frequency: Intermittent Patients Stated Pain Goal: 0 Pain Intervention(s): Medication (See eMAR)  Therapy/Group: Individual Therapy  Jelicia Nantz  Berlyn Saylor, PTA  03/20/2020, 3:28 PM

## 2020-03-20 NOTE — Progress Notes (Signed)
[facility] PHYSICAL MEDICINE & REHABILITATION PROGRESS NOTE   Subjective/Complaints: Denies pain, constipation Happy for company Watching TV   ROS:   Pt denies SOB, abd pain, CP, N/V/C/D, and vision changes   Objective:   No results found. Recent Labs    03/18/20 1046  WBC 13.3*  HGB 12.2*  HCT 37.7*  PLT 264   Recent Labs    03/18/20 1046  NA 135  K 5.1  CL 102  CO2 26  GLUCOSE 169*  BUN 31*  CREATININE 1.27*  CALCIUM 9.0    Intake/Output Summary (Last 24 hours) at 03/20/2020 1631 Last data filed at 03/20/2020 1300 Gross per 24 hour  Intake 700 ml  Output 300 ml  Net 400 ml     Physical Exam: Vital Signs Blood pressure (!) 136/59, pulse 74, temperature 98.1 F (36.7 C), resp. rate 16, height 6\' 3"  (1.905 m), weight 99.3 kg, SpO2 100 %. General: Alert and oriented x 3, No apparent distress HEENT: Head is normocephalic, atraumatic, PERRLA, EOMI, sclera anicteric, oral mucosa pink and moist, dentition intact, ext ear canals clear,  Neck: Supple without JVD or lymphadenopathy Heart: Reg rate and rhythm. No murmurs rubs or gallops Chest: CTA bilaterally without wheezes, rales, or rhonchi; no distress Abdomen: Soft, non-tender, non-distended, bowel sounds positive. Extremities: No clubbing, cyanosis, or edema. Pulses are 2+ Psych:joking again- watching TV Neuro:  Comments: UEs- biceps 5-/5, WE 4+/5, triceps 4-/5, grip 4-/5, finger abd 3+/5 B/L- appears the same today, in spite of saying felt weaker yesterday and stronger today LEs- R HF 4+/5; L HF 4/5- otherwise 4+/5 in KE, DF, PF and EHL B/L- back to baseline B/L   Ox3 Decreased sensation to light touch in L1 to S2 B/L- otherwise intact in UEs and torso B/L MAS of 1 in LUE and LEs- MAS of 0 in RUE--no changes Spasticity/clawing appearing of L>R hands- also notable is intrinsic muscle atrophy- L>R hands- less clawing notable this AM Skin:intact- staples in posterior neck- healing well- no  erythema   Assessment/Plan: 1. 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  Physiatrist is providing close team supervision and 24 hour management of active medical problems listed below.  Physiatrist and rehab team continue to assess barriers to discharge/monitor patient progress toward functional and medical goals  Care Tool:  Bathing    Body parts bathed by patient: Right arm, Left arm, Chest, Abdomen, Front perineal area, Face, Right upper leg, Left upper leg, Right lower leg   Body parts bathed by helper: Left lower leg, Buttocks     Bathing assist Assist Level: Minimal Assistance - Patient > 75%     Upper Body Dressing/Undressing Upper body dressing   What is the patient wearing?: Pull over shirt    Upper body assist Assist Level: Maximal Assistance - Patient 25 - 49%    Lower Body Dressing/Undressing Lower body dressing      What is the patient wearing?: Incontinence brief, Pants     Lower body assist Assist for lower body dressing: Maximal Assistance - Patient 25 - 49%     Toileting Toileting Toileting Activity did not occur (Clothing management and hygiene only): Refused  Toileting assist Assist for toileting: Maximal Assistance - Patient 25 - 49% (standing in the [place])     Transfers Chair/bed transfer  Transfers assist     Chair/bed  transfer assist level: Dependent - mechanical lift     Locomotion Ambulation   Ambulation assist      Assist level: Minimal Assistance - Patient > 75% Assistive device: Walker-rolling Max distance: 20'   Walk 10 feet activity   Assist  Walk 10 feet activity did not occur: Safety/medical concerns  Assist level: Minimal Assistance - Patient > 75% Assistive device: Walker-rolling   Walk 50 feet activity   Assist Walk 50 feet with 2  turns activity did not occur: Safety/medical concerns         Walk 150 feet activity   Assist Walk 150 feet activity did not occur: Safety/medical concerns         Walk 10 feet on uneven surface  activity   Assist Walk 10 feet on uneven surfaces activity did not occur: Safety/medical concerns         Wheelchair     Assist Will patient use wheelchair at discharge?: Yes Type of Wheelchair: Manual    Wheelchair assist level: Minimal Assistance - Patient > 75% Max wheelchair distance: 71'    Wheelchair 50 feet with 2 turns activity    Assist        Assist Level: Minimal Assistance - Patient > 75%   Wheelchair 150 feet activity     Assist      Assist Level: Total Assistance - Patient < 25%   Blood pressure (!) 136/59, pulse 74, temperature 98.1 F (36.7 C), resp. rate 16, height 6\' 3"  (1.905 m), weight 99.3 kg, SpO2 100 %.  Medical Problem List and Plan: 1.Cervical myelopathysecondary to cervical spinal cord compression status post cervical laminectomy and medialfacetectomyC3-4 4-5 posterior cervical arthrodesis C3-C6. Cervical collar at all times? 8/20- pt was given soft collar- refuses to wear it -patient may Shower with covering incision -ELOS/Goals: min A to Mod I, 3 weeks  -Continue CIR 2. Antithrombotics: -DVT/anticoagulation: .  vascular study ok  8/21 lovenox   8/26- will need for 3 months total, since not walking -antiplatelet therapy: N/A 3. Pain Management:Neurontin 300 mg twice daily,baclofen 5 mg 3 times daily,Cymbalta 60 mg daily, oxycodone as needed   8/22 holding scheduled gabapentin and baclofen given ileus  8/23- will restart Baclofen 5 mg TID since having bad muscle spasms of hands L>R- explained to pt.   8/25- pain doing better/spasms better  8/26- haven't restart gabapentin- pt hasn't asked- so far.   8/28: uses oxycodone sparingly (once today). Wean to 5mg  dose.  4.  Mood:Provide emotional support -antipsychotic agents: N/A 5. Neuropsych: This patientiscapable of making decisions on hisown behalf. 6. Skin/Wound Care:Routine skin checks 7. Fluids/Electrolytes/Nutrition:Routine in and outs with follow-up chemistries 8. Hypertension. Hydralazine 25 mg twice daily, labetalol 100 mg twice daily, lisinopril 40 mg daily. Monitor with increased mobility  8/26- will decrease Labetalol to 50 mg BID since pulse is low  8/27- Pulse slightly better- 58 this AM- will monitor  8/28: stable 9. Diabetes mellitus with peripheral neuropathy. Hemoglobin A1c 7.1. NovoLog 12units 3 times daily with meals, Lantus insulin 55units daily. Check blood sugars before meals and at bedtime. Diabetic teaching CBG (last 3)  Recent Labs    03/20/20 0622 03/20/20 1140 03/20/20 1626  GLUCAP 136* 204* 177*    8/20-22- BG's have been very labile-   8/27- BGs doing better- con't regimen  8/28: elevated: Increase Lantus to 46 units.  10. CKD stage II. Creatinine baseline 1.46. Follow-up chemistries  8/20- Baseline is 1.4-   8/22 Cr 1 today, holding lasix for  now  -kayexalate for hyperkalemia  8/25- recheck labs in AM  8/26- Labs not back yet- will monitor  8/27- Cr 1.27- which is around baseline; K+ 5.1- will give some Lokelma again.  11. Morbid obesity. BMI 30.09. Dietary follow-up 12. History of alcohol tobacco cocaine use. Providing counseling 13. Diastolic congestive heart failure. Lasix 40 mg daily. Monitor with increased mobility   Filed Weights   03/19/20 0558 03/20/20 0116 03/20/20 0525  Weight: 98.3 kg 99.7 kg 99.3 kg    8/22 weights down  8/23- weight up 1 kg- in spite of pooping.  8/27- Weight 98.7 kg- stable 14. Hyperlipidemia. Lipitor 15. Neurogenic bowel/ileus and bladder- goes every 1 week - which is likely the slowing of neurogenic bowel - need to improve speed; also has urinary urgency, which is also neurogenic- will  monitor and treat as required.   8/20- gave Sorbitol to get pt to have BM today- has been 7-8 days- usually goes 1x/week  8/22 colonic ileus on xray and CT. No SBO  8/23- still has moderate stool burden- will give another dose Sorbitol and then change back to regular diet if poops. Also change reglan to PO 10 mg TID-AC x 2 days- since lost IV access.   8/24- didn't have another BM- will give 1/2 bottle Mg citrate at noon and by dinner, change diet back to regular DM diet.   8/25- 2 large BMs yesterday- put back to regular/DM diet-  8/27- will place on bowel program, OK with pt- will add lidocaine jelly as well    16. Spasticity- on Baclofen 5mg  TID- adequately controlled- mainly annoying, not painful- will monitor muscle spasms. 17. Hyperkalemia  8/20- Not on any K+ replacement- K+ is 5.1-max level of normal- will monitor closely.    8/22 kayexalate today 18. Leukocytosis-   8/20- on Decadron slow taper- WBC 20k-   will check U/A and Cx just in case- afebrile- but will monitor for sx's of infection.    8/21-22 ua +, ucx pending--> await results   -suspect ileus playing a role too  8/23- U/A (+) somewhat- U Cx growing >100k E Coli- WBC down to 16.6k  8/24- UTI- EColi- will start Bactrim for UTI- based on Cx's- Bactrim DS BID x 7 days.  8/25- no complaints of Sx's.   8/27- WBC down to 13.3k    LOS: 9 days A FACE TO FACE EVALUATION WAS PERFORMED  Clide Deutscher Lil Lepage 03/20/2020, 4:31 PM

## 2020-03-20 NOTE — Progress Notes (Signed)
Pt continent of medium stool. Peri care given.

## 2020-03-20 NOTE — Plan of Care (Signed)
  Problem: Consults Goal: RH GENERAL PATIENT EDUCATION Description: See Patient Education module for education specifics. Outcome: Progressing Goal: Skin Care Protocol Initiated - if Braden Score 18 or less Description: If consults are not indicated, leave blank or document N/A Outcome: Progressing Goal: Nutrition Consult-if indicated Outcome: Progressing Goal: Diabetes Guidelines if Diabetic/Glucose > 140 Description: If diabetic or lab glucose is > 140 mg/dl - Initiate Diabetes/Hyperglycemia Guidelines & Document Interventions  Outcome: Progressing   Problem: RH BOWEL ELIMINATION Goal: RH STG MANAGE BOWEL WITH ASSISTANCE Description: STG Manage Bowel with mod Assistance. Outcome: Progressing Goal: RH STG MANAGE BOWEL W/MEDICATION W/ASSISTANCE Description: STG Manage Bowel with Medication with mod Assistance. Outcome: Progressing   Problem: RH BLADDER ELIMINATION Goal: RH STG MANAGE BLADDER WITH ASSISTANCE Description: STG Manage Bladder With mod Assistance Outcome: Progressing   Problem: RH SAFETY Goal: RH STG ADHERE TO SAFETY PRECAUTIONS W/ASSISTANCE/DEVICE Description: STG Adhere to Safety Precautions With min Assistance/Device. Outcome: Progressing Goal: RH STG DECREASED RISK OF FALL WITH ASSISTANCE Description: STG Decreased Risk of Fall With min Assistance. Outcome: Progressing   Problem: RH PAIN MANAGEMENT Goal: RH STG PAIN MANAGED AT OR BELOW PT'S PAIN GOAL Description: Less than 3 out of 10  Outcome: Progressing

## 2020-03-21 ENCOUNTER — Inpatient Hospital Stay (HOSPITAL_COMMUNITY): Payer: Medicare Other | Admitting: Occupational Therapy

## 2020-03-21 ENCOUNTER — Inpatient Hospital Stay (HOSPITAL_COMMUNITY): Payer: Medicare Other | Admitting: Physical Therapy

## 2020-03-21 LAB — GLUCOSE, CAPILLARY
Glucose-Capillary: 189 mg/dL — ABNORMAL HIGH (ref 70–99)
Glucose-Capillary: 97 mg/dL (ref 70–99)
Glucose-Capillary: 123 mg/dL — ABNORMAL HIGH (ref 70–99)
Glucose-Capillary: 133 mg/dL — ABNORMAL HIGH (ref 70–99)

## 2020-03-21 NOTE — Progress Notes (Signed)
[facility] PHYSICAL MEDICINE & REHABILITATION PROGRESS NOTE   Subjective/Complaints: Patient is receiving patient care Has no complaints Participated well with OT today. Sponge bath completed.  ROS:   Pt denies SOB, abd pain, CP, N/V/C/D, and vision changes   Objective:   No results found. No results for input(s): WBC, HGB, HCT, PLT in the last 72 hours. No results for input(s): NA, K, CL, CO2, GLUCOSE, BUN, CREATININE, CALCIUM in the last 72 hours.  Intake/Output Summary (Last 24 hours) at 03/21/2020 1410 Last data filed at 03/21/2020 0830 Gross per 24 hour  Intake 422 ml  Output --  Net 422 ml     Physical Exam: Vital Signs Blood pressure (!) 129/56, pulse 79, temperature 98.2 F (36.8 C), resp. rate 18, height 6\' 3"  (1.905 m), weight 99.7 kg, SpO2 100 %. General: Alert and oriented x 3, No apparent distress HEENT: Head is normocephalic, atraumatic, PERRLA, EOMI, sclera anicteric, oral mucosa pink and moist, dentition intact, ext ear canals clear,  Neck: Supple without JVD or lymphadenopathy Heart: Reg rate and rhythm. No murmurs rubs or gallops Chest: CTA bilaterally without wheezes, rales, or rhonchi; no distress Abdomen: Soft, non-tender, non-distended, bowel sounds positive. Extremities: No clubbing, cyanosis, or edema. Pulses are 2+ Psych: positive Neuro:  Comments: UEs- biceps 5-/5, WE 4+/5, triceps 4-/5, grip 4-/5, finger abd 3+/5 B/L- appears the same today, in spite of saying felt weaker yesterday and stronger today LEs- R HF 4+/5; L HF 4/5- otherwise 4+/5 in KE, DF, PF and EHL B/L- back to baseline B/L   Ox3 Decreased sensation to light touch in L1 to S2 B/L- otherwise intact in UEs and torso B/L MAS of 1 in LUE and LEs- MAS of 0 in RUE--no changes Spasticity/clawing appearing of L>R hands- also notable is intrinsic muscle atrophy- L>R hands- less clawing notable this AM Skin:intact- staples in posterior neck- healing well- no  erythema    Assessment/Plan: 1. 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  Physiatrist is providing close team supervision and 24 hour management of active medical problems listed below.  Physiatrist and rehab team continue to assess barriers to discharge/monitor patient progress toward functional and medical goals  Care Tool:  Bathing    Body parts bathed by patient: Right arm, Left arm, Chest, Abdomen, Front perineal area, Face, Right upper leg, Left upper leg, Right lower leg   Body parts bathed by helper: Left lower leg, Buttocks     Bathing assist Assist Level: Minimal Assistance - Patient > 75%     Upper Body Dressing/Undressing Upper body dressing   What is the patient wearing?: Pull over shirt    Upper body assist Assist Level: Maximal Assistance - Patient 25 - 49%    Lower Body Dressing/Undressing Lower body dressing      What is the patient wearing?: Incontinence brief, Pants     Lower body assist Assist for lower body dressing: Maximal Assistance - Patient 25 - 49%     Toileting Toileting Toileting Activity did not occur (Clothing management and hygiene only): Refused  Toileting assist Assist for toileting: Maximal Assistance - Patient 25 - 49% (standing in the [place])     Transfers Chair/bed transfer  Transfers assist     Chair/bed transfer assist level: Dependent - mechanical lift     Locomotion Ambulation   Ambulation assist  Assist level: Minimal Assistance - Patient > 75% Assistive device: Walker-rolling Max distance: 20'   Walk 10 feet activity   Assist  Walk 10 feet activity did not occur: Safety/medical concerns  Assist level: Minimal Assistance - Patient > 75% Assistive device: Walker-rolling   Walk 50 feet activity   Assist Walk 50 feet with  2 turns activity did not occur: Safety/medical concerns         Walk 150 feet activity   Assist Walk 150 feet activity did not occur: Safety/medical concerns         Walk 10 feet on uneven surface  activity   Assist Walk 10 feet on uneven surfaces activity did not occur: Safety/medical concerns         Wheelchair     Assist Will patient use wheelchair at discharge?: Yes Type of Wheelchair: Manual    Wheelchair assist level: Minimal Assistance - Patient > 75% Max wheelchair distance: 64'    Wheelchair 50 feet with 2 turns activity    Assist        Assist Level: Minimal Assistance - Patient > 75%   Wheelchair 150 feet activity     Assist      Assist Level: Total Assistance - Patient < 25%   Blood pressure (!) 129/56, pulse 79, temperature 98.2 F (36.8 C), resp. rate 18, height 6\' 3"  (1.905 m), weight 99.7 kg, SpO2 100 %.  Medical Problem List and Plan: 1.Cervical myelopathysecondary to cervical spinal cord compression status post cervical laminectomy and medialfacetectomyC3-4 4-5 posterior cervical arthrodesis C3-C6. Cervical collar at all times? 8/20- pt was given soft collar- refuses to wear it -patient may Shower with covering incision -ELOS/Goals: min A to Mod I, 3 weeks  -Continue CIR 2. Antithrombotics: -DVT/anticoagulation: .  vascular study ok  8/21 lovenox   8/26- will need for 3 months total, since not walking -antiplatelet therapy: N/A 3. Pain Management:Neurontin 300 mg twice daily,baclofen 5 mg 3 times daily,Cymbalta 60 mg daily, oxycodone as needed   8/22 holding scheduled gabapentin and baclofen given ileus  8/23- will restart Baclofen 5 mg TID since having bad muscle spasms of hands L>R- explained to pt.   8/25- pain doing better/spasms better  8/26- haven't restart gabapentin- pt hasn't asked- so far.   8/28: uses oxycodone sparingly (once today). Wean to 5mg  dose.   8/29:  has used oxycodone once today.  4. Mood:Provide emotional support -antipsychotic agents: N/A 5. Neuropsych: This patientiscapable of making decisions on hisown behalf. 6. Skin/Wound Care:Routine skin checks 7. Fluids/Electrolytes/Nutrition:Routine in and outs with follow-up chemistries 8. Hypertension. Hydralazine 25 mg twice daily, labetalol 100 mg twice daily, lisinopril 40 mg daily. Monitor with increased mobility  8/26- will decrease Labetalol to 50 mg BID since pulse is low  8/27- Pulse slightly better- 58 this AM- will monitor  8/28-8/29: stable 9. Diabetes mellitus with peripheral neuropathy. Hemoglobin A1c 7.1. NovoLog 12units 3 times daily with meals, Lantus insulin 55units daily. Check blood sugars before meals and at bedtime. Diabetic teaching CBG (last 3)  Recent Labs    03/20/20 2059 03/21/20 0441 03/21/20 1130  GLUCAP 132* 189* 97    8/20-22- BG's have been very labile-   8/27- BGs doing better- con't regimen  8/28: elevated: Increase Lantus to 46 units.   8/29: labile: continue to monitor 10. CKD stage II. Creatinine baseline 1.46. Follow-up chemistries  8/20- Baseline is 1.4-   8/22 Cr 1 today, holding lasix for now  -kayexalate for hyperkalemia  8/25- recheck  labs in AM  8/26- Labs not back yet- will monitor  8/27- Cr 1.27- which is around baseline; K+ 5.1- will give some Lokelma again.  11. Morbid obesity. BMI 30.09. Dietary follow-up 12. History of alcohol tobacco cocaine use. Providing counseling 13. Diastolic congestive heart failure. Lasix 40 mg daily. Monitor with increased mobility   Filed Weights   03/20/20 0116 03/20/20 0525 03/21/20 0442  Weight: 99.7 kg 99.3 kg 99.7 kg    8/22 weights down  8/23- weight up 1 kg- in spite of pooping.  8/27- Weight 98.7 kg- stable 14. Hyperlipidemia. Lipitor 15. Neurogenic bowel/ileus and bladder- goes every 1 week - which is likely the slowing of neurogenic bowel - need to  improve speed; also has urinary urgency, which is also neurogenic- will monitor and treat as required.   8/20- gave Sorbitol to get pt to have BM today- has been 7-8 days- usually goes 1x/week  8/22 colonic ileus on xray and CT. No SBO  8/23- still has moderate stool burden- will give another dose Sorbitol and then change back to regular diet if poops. Also change reglan to PO 10 mg TID-AC x 2 days- since lost IV access.   8/24- didn't have another BM- will give 1/2 bottle Mg citrate at noon and by dinner, change diet back to regular DM diet.   8/25- 2 large BMs yesterday- put back to regular/DM diet-  8/27- will place on bowel program, OK with pt- will add lidocaine jelly as well    16. Spasticity- on Baclofen 5mg  TID- adequately controlled- mainly annoying, not painful- will monitor muscle spasms. 17. Hyperkalemia  8/20- Not on any K+ replacement- K+ is 5.1-max level of normal- will monitor closely.    8/22 kayexalate today 18. Leukocytosis-   8/20- on Decadron slow taper- WBC 20k-   will check U/A and Cx just in case- afebrile- but will monitor for sx's of infection.    8/21-22 ua +, ucx pending--> await results   -suspect ileus playing a role too  8/23- U/A (+) somewhat- U Cx growing >100k E Coli- WBC down to 16.6k  8/24- UTI- EColi- will start Bactrim for UTI- based on Cx's- Bactrim DS BID x 7 days.  8/25- no complaints of Sx's.   8/27- WBC down to 13.3k    LOS: 10 days A FACE TO FACE EVALUATION WAS PERFORMED  Pessy Delamar P Mailynn Everly 03/21/2020, 2:10 PM

## 2020-03-21 NOTE — Progress Notes (Signed)
Physical Therapy Session Note  Patient Details  Name: [name] MRN: [id number] Date of Birth: 1961/01/15  Today's Date: 03/21/2020 PT Individual Time: 1600-1700 PT Individual Time Calculation (min): 60 min   Short Term Goals: Week 2:  PT Short Term Goal 1 (Week 2): pt to demonstrate dynamic sitting balance for functional tasks at min A PT Short Term Goal 2 (Week 2): pt to demonstrate min A x1 functional transfers with LRAD consistently PT Short Term Goal 3 (Week 2): pt to demonstate min A x1 bed mobility consistently PT Short Term Goal 4 (Week 2): pt to demonstrate gait training with LRAD for 75' min A  Skilled Therapeutic Interventions/Progress Updates:    Pt received sidelying in bed asleep, arousable and agreeable to PT session. Pt reports chronic pain in his neck, not rated and declines intervention. Sidelying to sitting EOB with min A for trunk control. Sit to stand with min A to RW from elevated bed. Stand pivot transfer to w/c with RW and CGA. Dependent transport via w/c to therapy gym for time conservation. Sit to stand with CGA to min A to RW for remainder of session. Ambulation x 30 ft, x 60 ft with RW and CGA for balance. Cues for increased step length as pt tends to shuffle and drag feet especially with turns and when backing up to chair to sit. Nustep level 3-4 x 3 rounds of 2 min with use of B UE/LE for global endurance training. Pt initially reports RPE of 11/20 on Borg scale at level 3 so increased resistance to level 4. Pt then reports RPE of 13/20 once resistance increased. Ambulation back to w/c x 15 ft with RW and CGA. Manual w/c propulsion x 75 ft with use of BUE at Supervision level with several seated rest breaks due to fatigue. Pt agreeable to sit up in recliner at end of session. Stand pivot transfer w/c to recliner with RW and CGA. Pt left seated in recliner in room with needs in reach, quick release belt and chair alarm in place at end of session.  Therapy  Documentation Precautions:  Precautions Precautions: Cervical Precaution Booklet Issued: No Required Braces or Orthoses: Cervical Brace Cervical Brace: Soft collar (when up OOB) Restrictions Weight Bearing Restrictions: No    Therapy/Group: Individual Therapy   Excell Seltzer, PT, DPT  03/21/2020, 5:07 PM

## 2020-03-21 NOTE — Progress Notes (Signed)
Pt suppository given with Lidocaine. Bowel stimulation performed. Pt tolerated well. Small amount of soft brown stool noted to glove.

## 2020-03-21 NOTE — Progress Notes (Signed)
Occupational Therapy Session Note  Patient Details  Name: [name] MRN: [id number] Date of Birth: 12/15/1960  Today's Date: 03/21/2020 OT Individual Time: 0905-1000 OT Individual Time Calculation (min): 55 min    Short Term Goals: Week 2:  OT Short Term Goal 1 (Week 2): Pt will be able to consistently complete sit to stands with min A or less to increase independence with toileting. OT Short Term Goal 2 (Week 2): Pt will be able to manage clothing over hips with min A. OT Short Term Goal 3 (Week 2): Pt will be able to don pants over feet with min A using reacher. OT Short Term Goal 4 (Week 2): Pt will be able to sit to EOB with S.  Skilled Therapeutic Interventions/Progress Updates:    patient in bed, alert, states that he is tired but agrees to begin this session early today.  He denies pain.  He is unaware that brief is soiled with BM.  Dependent to complete hygiene and change soiled brief at bed level.  Reviewed skin care and importance of getting brief checked regularly due to impaired sensation.  Sponge bath completed bed level with assistance needed for thoroughness of lower legs and buttocks.  He is able to doff socks, and donn pants, min A to complete CM via rolling side to side.  Max A to donn slipper socks.  Rolling in bed mod I with rails.  Supine to sitting edge of bed with min A.  Donning soft collar and [place] shirt seated edge of bed with min A due to tight fit of hospital scrubs.   Sit to stand from elevated bed surface CGA.  SPT to w/c with RW CGA.   Provided clean stockinette sleeve for collar.  Completed standing with RW prior to return to w/c at close of session.  Seat belt alarm set and call bell in reach.    Therapy Documentation Precautions:  Precautions Precautions: Cervical Precaution Booklet Issued: No Required Braces or Orthoses: Cervical Brace Cervical Brace: Soft collar (when up OOB) Restrictions Weight Bearing Restrictions: No  Therapy/Group: Individual  Therapy  Carlos Levering 03/21/2020, 7:32 AM

## 2020-03-22 ENCOUNTER — Inpatient Hospital Stay (HOSPITAL_COMMUNITY): Payer: Medicare Other | Admitting: Physical Therapy

## 2020-03-22 ENCOUNTER — Inpatient Hospital Stay (HOSPITAL_COMMUNITY): Payer: Medicare Other | Admitting: Occupational Therapy

## 2020-03-22 LAB — CBC WITH DIFFERENTIAL/PLATELET
Abs Immature Granulocytes: 0.17 10*3/uL — ABNORMAL HIGH (ref 0.00–0.07)
Basophils Absolute: 0 10*3/uL (ref 0.0–0.1)
Basophils Relative: 0 %
Eosinophils Absolute: 0.5 10*3/uL (ref 0.0–0.5)
Eosinophils Relative: 4 %
HCT: 34.2 % — ABNORMAL LOW (ref 39.0–52.0)
Hemoglobin: 10.7 g/dL — ABNORMAL LOW (ref 13.0–17.0)
Immature Granulocytes: 1 %
Lymphocytes Relative: 13 %
Lymphs Abs: 1.6 10*3/uL (ref 0.7–4.0)
MCH: 27.2 pg (ref 26.0–34.0)
MCHC: 31.3 g/dL (ref 30.0–36.0)
MCV: 87 fL (ref 80.0–100.0)
Monocytes Absolute: 0.7 10*3/uL (ref 0.1–1.0)
Monocytes Relative: 6 %
Neutro Abs: 9.6 10*3/uL — ABNORMAL HIGH (ref 1.7–7.7)
Neutrophils Relative %: 76 %
Platelets: 264 10*3/uL (ref 150–400)
RBC: 3.93 MIL/uL — ABNORMAL LOW (ref 4.22–5.81)
RDW: 13.9 % (ref 11.5–15.5)
WBC: 12.6 10*3/uL — ABNORMAL HIGH (ref 4.0–10.5)
nRBC: 0 % (ref 0.0–0.2)

## 2020-03-22 LAB — GLUCOSE, CAPILLARY
Glucose-Capillary: 161 mg/dL — ABNORMAL HIGH (ref 70–99)
Glucose-Capillary: 84 mg/dL (ref 70–99)
Glucose-Capillary: 151 mg/dL — ABNORMAL HIGH (ref 70–99)
Glucose-Capillary: 113 mg/dL — ABNORMAL HIGH (ref 70–99)

## 2020-03-22 LAB — BASIC METABOLIC PANEL
Anion gap: 7 (ref 5–15)
BUN: 27 mg/dL — ABNORMAL HIGH (ref 8–23)
CO2: 23 mmol/L (ref 22–32)
Calcium: 8.7 mg/dL — ABNORMAL LOW (ref 8.9–10.3)
Chloride: 108 mmol/L (ref 98–111)
Creatinine, Ser: 1.11 mg/dL (ref 0.61–1.24)
GFR calc Af Amer: >60 mL/min (ref >60)
GFR calc non Af Amer: >60 mL/min (ref >60)
Glucose, Bld: 152 mg/dL — ABNORMAL HIGH (ref 70–99)
Potassium: 5.9 mmol/L — ABNORMAL HIGH (ref 3.5–5.1)
Sodium: 138 mmol/L (ref 135–145)

## 2020-03-22 MED ORDER — CEPHALEXIN 250 MG PO CAPS
500.0000 mg | ORAL_CAPSULE | Freq: Four times a day (QID) | ORAL | Status: AC
  Administered 2020-03-22 – 2020-03-23 (×2): 500 mg via ORAL
  Filled 2020-03-22 (×2): qty 2

## 2020-03-22 MED ORDER — SODIUM ZIRCONIUM CYCLOSILICATE 10 G PO PACK
10.0000 g | PACK | Freq: Once | ORAL | Status: AC
  Administered 2020-03-22: 10 g via ORAL
  Filled 2020-03-22: qty 1

## 2020-03-22 NOTE — Progress Notes (Signed)
Occupational Therapy Session Note  Patient Details  Name: [name] MRN: [id number] Date of Birth: 1960-08-03  Today's Date: 03/22/2020 OT Individual Time: 1300-1341 OT Individual Time Calculation (min): 41 min   Short Term Goals: Week 2:  OT Short Term Goal 1 (Week 2): Pt will be able to consistently complete sit to stands with min A or less to increase independence with toileting. OT Short Term Goal 2 (Week 2): Pt will be able to manage clothing over hips with min A. OT Short Term Goal 3 (Week 2): Pt will be able to don pants over feet with min A using reacher. OT Short Term Goal 4 (Week 2): Pt will be able to sit to EOB with S.  Skilled Therapeutic Interventions/Progress Updates:    Pt greeted in the recliner after eating lunch, no c/o pain. He requested to go outdoors for tx, ADL needs met, we donned his soft c-collar prior to mobility. Max A for sit<stand from low recliner surface, CGA for pivot to the w/c using RW. Worked on sit<stands while sitting outdoors, pt requiring vcs for sequencing, had the most trouble with anterior weight shifting forward during power up. He required Mod A initially fading to Min A for power up. When he was returned to the room pt completed a short distance ambulatory transfer to bed ~5 ft using RW, CGA sit<stand and also for ambulation using device. He was able to pull himself up in bed using the headboard when bed was placed in trendelenburg position. Pt remained comfortably in bed with all needs within reach, bed alarm set, and in care of RN.   Therapy Documentation Precautions:  Precautions Precautions: Cervical Precaution Booklet Issued: No Required Braces or Orthoses: Cervical Brace Cervical Brace: Soft collar (when up OOB) Restrictions Weight Bearing Restrictions: No Vital Signs: Therapy Vitals Temp: 98.3 F (36.8 C) Pulse Rate: 68 Resp: 17 BP: 140/64 Patient Position (if appropriate): Lying Oxygen Therapy SpO2: 99 % O2 Device: Room  Air ADL: ADL Eating: Set up Where Assessed-Eating: Bed level Grooming: Setup Where Assessed-Grooming: Sitting at sink Upper Body Bathing: Setup Where Assessed-Upper Body Bathing: Chair Lower Body Bathing: Maximal assistance Where Assessed-Lower Body Bathing: Chair Upper Body Dressing: Setup Where Assessed-Upper Body Dressing: Chair Lower Body Dressing: Maximal assistance Where Assessed-Lower Body Dressing: Chair Toileting: Maximal assistance Where Assessed-Toileting: Glass blower/designer: Maximal Print production planner Method: Stand pivot     Therapy/Group: Individual Therapy  [name] 03/22/2020, 3:31 PM

## 2020-03-22 NOTE — Progress Notes (Signed)
[place] PHYSICAL MEDICINE & REHABILITATION PROGRESS NOTE   Subjective/Complaints:  Pt asking when going to SNF- feeling weak, like did 1 day last week and cold- Bowel program "worked" per pt- although documented is that  Only stool was on glove- will check with nursing.   Needs some Lokelma due to K+ of 5.9 Due to get staples out of neck   ROS:    Pt denies SOB, abd pain, CP, N/V/C/D, and vision changes    Objective:   No results found. Recent Labs    03/22/20 0511  WBC 12.6*  HGB 10.7*  HCT 34.2*  PLT 264   Recent Labs    03/22/20 0511  NA 138  K 5.9*  CL 108  CO2 23  GLUCOSE 152*  BUN 27*  CREATININE 1.11  CALCIUM 8.7*    Intake/Output Summary (Last 24 hours) at 03/22/2020 1241 Last data filed at 03/22/2020 0804 Gross per 24 hour  Intake 580 ml  Output 350 ml  Net 230 ml     Physical Exam: Vital Signs Blood pressure 136/61, pulse 64, temperature 98.2 F (36.8 C), temperature source Oral, resp. rate 17, height 6\' 3"  (1.905 m), weight 100.5 kg, SpO2 100 %. General: pt awake, laying in bed; supine- appropriate, in covers, NAD HEENT: conjugate gaze Heart: RRR Chest: CTA B/L- no W/R/R- good air movement Abdomen: Soft, NT, ND, (+)BS  Extremities: No clubbing, cyanosis, or edema. Pulses are 2+ Psych: unhappy about bowel program, but understands why needs it Neuro:  Comments: UEs- biceps 5-/5, WE 4+/5, triceps 4-/5, grip 4-/5, finger abd 3+/5 B/L LEs- R HF 4+/5; L HF 4/5- otherwise 4+/5 in KE, DF, PF and EHL B/L- L   Ox3 Decreased sensation to light touch in L1 to S2 B/L- otherwise intact in UEs and torso B/L MAS of 1 in LUE and LEs- MAS of 0 in RUE--no changes Very mild Spasticity/clawing appearing of L>R hands- also notable is intrinsic muscle atrophy- L>R hands-less noticeable this AM Skin:intact- staples in posterior neck- healing well- no erythema    Assessment/Plan: 1. 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  Physiatrist is providing close team supervision and 24 hour management of active medical problems listed below.  Physiatrist and rehab team continue to assess barriers to discharge/monitor patient progress toward functional and medical goals  Care Tool:  Bathing    Body parts bathed by patient: Right arm, Left arm, Chest, Abdomen, Front perineal area, Face, Right upper leg, Left upper leg, Right lower leg   Body parts bathed by helper: Left lower leg, Buttocks     Bathing assist Assist Level: Minimal Assistance - Patient > 75%     Upper Body Dressing/Undressing Upper body dressing   What is the patient wearing?: Pull over shirt    Upper body assist Assist Level: Maximal Assistance - Patient 25 - 49%    Lower Body Dressing/Undressing Lower body dressing      What is the patient wearing?: Incontinence brief, Pants     Lower body assist Assist for lower body dressing: Maximal Assistance - Patient 25 - 49%     Toileting Toileting Toileting Activity did not occur (Clothing management and hygiene only): Refused  Toileting assist Assist for toileting: Maximal Assistance - Patient 25 - 49% (standing in the [place])     Transfers Chair/bed transfer  Transfers  assist     Chair/bed transfer assist level: Contact Guard/Touching assist     Locomotion Ambulation   Ambulation assist      Assist level: Contact Guard/Touching assist Assistive device: Walker-rolling Max distance: 30   Walk 10 feet activity   Assist  Walk 10 feet activity did not occur: Safety/medical concerns  Assist level: Contact Guard/Touching assist Assistive device: Walker-rolling   Walk 50 feet activity   Assist Walk 50 feet with 2 turns activity did not occur: Safety/medical concerns  Assist level: Contact Guard/Touching assist Assistive  device: Walker-rolling    Walk 150 feet activity   Assist Walk 150 feet activity did not occur: Safety/medical concerns         Walk 10 feet on uneven surface  activity   Assist Walk 10 feet on uneven surfaces activity did not occur: Safety/medical concerns   Assist level: Minimal Assistance - Patient > 75% Assistive device: Aeronautical engineer Will patient use wheelchair at discharge?: Yes Type of Wheelchair: Manual    Wheelchair assist level: Minimal Assistance - Patient > 75% Max wheelchair distance: 72'    Wheelchair 50 feet with 2 turns activity    Assist        Assist Level: Minimal Assistance - Patient > 75%   Wheelchair 150 feet activity     Assist      Assist Level: Total Assistance - Patient < 25%   Blood pressure 136/61, pulse 64, temperature 98.2 F (36.8 C), temperature source Oral, resp. rate 17, height 6\' 3"  (1.905 m), weight 100.5 kg, SpO2 100 %.  Medical Problem List and Plan: 1.Cervical myelopathysecondary to cervical spinal cord compression status post cervical laminectomy and medialfacetectomyC3-4 4-5 posterior cervical arthrodesis C3-C6. Cervical collar at all times? 8/20- pt was given soft collar- refuses to wear it -patient may Shower with covering incision -ELOS/Goals: min A to Mod I, 3 weeks  8/30- looking for SNF- will needs labs at SNF due to continuing to be be high K+- also, will get Staples out of neck today- is due  -Continue CIR 2. Antithrombotics: -DVT/anticoagulation: .  vascular study ok  8/21 lovenox   8/26- will need for 3 months total, since not walking -antiplatelet therapy: N/A 3. Pain Management:Neurontin 300 mg twice daily,baclofen 5 mg 3 times daily,Cymbalta 60 mg daily, oxycodone as needed   8/22 holding scheduled gabapentin and baclofen given ileus  8/23- will restart Baclofen 5 mg TID since having bad muscle spasms of hands L>R-  explained to pt.   8/25- pain doing better/spasms better  8/26- haven't restart gabapentin- pt hasn't asked- so far.   8/28: uses oxycodone sparingly (once today). Wean to 5mg  dose.   8/29: has used oxycodone once today.  4. Mood:Provide emotional support -antipsychotic agents: N/A 5. Neuropsych: This patientiscapable of making decisions on hisown behalf. 6. Skin/Wound Care:Routine skin checks 7. Fluids/Electrolytes/Nutrition:Routine in and outs with follow-up chemistries 8. Hypertension. Hydralazine 25 mg twice daily, labetalol 100 mg twice daily, lisinopril 40 mg daily. Monitor with increased mobility  8/26- will decrease Labetalol to 50 mg BID since pulse is low  8/27- Pulse slightly better- 58 this AM- will monitor  8/28-8/29: stable 9. Diabetes mellitus with peripheral neuropathy. Hemoglobin A1c 7.1. NovoLog 12units 3 times daily with meals, Lantus insulin 55units daily. Check blood sugars before meals and at bedtime. Diabetic teaching CBG (last 3)  Recent Labs    03/21/20 2052 03/22/20 0523 03/22/20 1155  GLUCAP 133* 161* 84  8/20-22- BG's have been very labile-   8/27- BGs doing better- con't regimen  8/28: elevated: Increase Lantus to 46 units.   8/30- BGs much improved with increase in lantus over weekend 10. CKD stage II. Creatinine baseline 1.46. Follow-up chemistries  8/20- Baseline is 1.4-   8/22 Cr 1 today, holding lasix for now  -kayexalate for hyperkalemia  8/25- recheck labs in AM  8/26- Labs not back yet- will monitor  8/27- Cr 1.27- which is around baseline; K+ 5.1- will give some Lokelma again.   8/30- K+ 5.9- even after Lokelma on Friday- will speak with pharmacy might need to schedule low dose- will give another dose today 11. Morbid obesity. BMI 30.09. Dietary follow-up 12. History of alcohol tobacco cocaine use. Providing counseling 13. Diastolic congestive heart failure. Lasix 40 mg daily. Monitor with increased  mobility   Filed Weights   03/20/20 0525 03/21/20 0442 03/22/20 0356  Weight: 99.3 kg 99.7 kg 100.5 kg    8/22 weights down  8/23- weight up 1 kg- in spite of pooping.  8/27- Weight 98.7 kg- stable  8/30- Weight up slightly 99.7- on 8/29- however not more than original weight.  14. Hyperlipidemia. Lipitor 15. Neurogenic bowel/ileus and bladder- goes every 1 week - which is likely the slowing of neurogenic bowel - need to improve speed; also has urinary urgency, which is also neurogenic- will monitor and treat as required.   8/20- gave Sorbitol to get pt to have BM today- has been 7-8 days- usually goes 1x/week  8/22 colonic ileus on xray and CT. No SBO  8/23- still has moderate stool burden- will give another dose Sorbitol and then change back to regular diet if poops. Also change reglan to PO 10 mg TID-AC x 2 days- since lost IV access.   8/24- didn't have another BM- will give 1/2 bottle Mg citrate at noon and by dinner, change diet back to regular DM diet.   8/25- 2 large BMs yesterday- put back to regular/DM diet-  8/27- will place on bowel program, OK with pt- will add lidocaine jelly as well   8/30- bowel program OK 16. Spasticity- on Baclofen 5mg  TID- adequately controlled- mainly annoying, not painful- will monitor muscle spasms. 17. Hyperkalemia  8/20- Not on any K+ replacement- K+ is 5.1-max level of normal- will monitor closely.    8/22 kayexalate today  8/30- gave Lokelma 10 G today- and will recheck labs in AM 18. Leukocytosis-   8/20- on Decadron slow taper- WBC 20k-   will check U/A and Cx just in case- afebrile- but will monitor for sx's of infection.    8/21-22 ua +, ucx pending--> await results   -suspect ileus playing a role too  8/23- U/A (+) somewhat- U Cx growing >100k E Coli- WBC down to 16.6k  8/24- UTI- EColi- will start Bactrim for UTI- based on Cx's- Bactrim DS BID x 7 days.  8/25- no complaints of Sx's.   8/27- WBC down to 13.3k  8/30- WBC down to  12.6- on decadron taper    LOS: 11 days A FACE TO FACE EVALUATION WAS PERFORMED  Shalee Paolo 03/22/2020, 12:41 PM

## 2020-03-22 NOTE — Progress Notes (Signed)
Physical Therapy Session Note  Patient Details  Name: [name] MRN: [id number] Date of Birth: 1960/10/17  Today's Date: 03/22/2020 PT Individual Time: 5852-7782 PT Individual Time Calculation (min): 69 min   Short Term Goals: Week 2:  PT Short Term Goal 1 (Week 2): pt to demonstrate dynamic sitting balance for functional tasks at min A PT Short Term Goal 2 (Week 2): pt to demonstrate min A x1 functional transfers with LRAD consistently PT Short Term Goal 3 (Week 2): pt to demonstate min A x1 bed mobility consistently PT Short Term Goal 4 (Week 2): pt to demonstrate gait training with LRAD for 75' min A  Skilled Therapeutic Interventions/Progress Updates: Pt presents sitting in recliner and agreeable to therapy.  Pt required 2 attempts sit to stand from recliner w/ mod to min A and verbal cues for initiation.  Pt wheeled to outside for time management.  Pt amb multiple trials w/ RW and CGA, but verbal cues for foot clearance and step length.  Pt tends to shuffle w/o cueing.  Pt amb up to 25' per trial w/ noted flat-foot gait.  Pt able to improve w/ counting method, but returns especially w/ turns or approach to seat.  Pt performed sit <> stand from outside bench w/ mod to min A 2/2 height.  Pt performed standing marching 3 x 10.  Pt returned to gym and performed Nu-step x 4' x 2 at Level 3 and then c/o strain on post. neck and decreased to 2 w/ good tolerance.  Pt required seated rest break between trials.  Pt performed multiple standing to perform clothes pins using B UEs .  Pt amb from hallway to recliner w/ CGA and RW.  Chair alarm on and all needs in reach.     Therapy Documentation Precautions:  Precautions Precautions: Cervical Precaution Booklet Issued: No Required Braces or Orthoses: Cervical Brace Cervical Brace: Soft collar (when up OOB) Restrictions Weight Bearing Restrictions: No General:   Vital Signs:   Pain:0/10 to begin, 7/10 w/ Nu-step but then decreased. Pain  Assessment Pain Scale: 0-10 Pain Score: 0-No pain    Therapy/Group: Individual Therapy  Ladoris Gene 03/22/2020, 12:00 PM

## 2020-03-22 NOTE — Progress Notes (Signed)
Physical Therapy Session Note  Patient Details  Name: [name] MRN: [id number] Date of Birth: [birth year]  Today's Date: 03/22/2020 PT Individual Time: 0800-0900 PT Individual Time Calculation (min): 60 min   Short Term Goals: Week 2:  PT Short Term Goal 1 (Week 2): pt to demonstrate dynamic sitting balance for functional tasks at min A PT Short Term Goal 2 (Week 2): pt to demonstrate min A x1 functional transfers with LRAD consistently PT Short Term Goal 3 (Week 2): pt to demonstate min A x1 bed mobility consistently PT Short Term Goal 4 (Week 2): pt to demonstrate gait training with LRAD for 75' min A  Skilled Therapeutic Interventions/Progress Updates:    Pt received seated in bed, agreeable to PT session. No complaints of pain. Pt reports not feeling well this AM and did not sleep well, but agreeable to participate. Supine to sit with min A for trunk control with cues for log roll technique. Pt is setup A to don pants while seated EOB. Sit to stand with CGA to RW. Pt able to pull up pants over hips in standing with min A. Ambulation x 5 ft to w/c with RW and CGA. Pt is setup A for oral hygiene and washing his face while seated in w/c at sink. Dependent transport via w/c to/from therapy gym for time conservation. Sit to stand with CGA to min A to RW for remainder of session. Ambulation x 77', x 50' with RW and CGA. Pt continues to exhibit shuffling gait pattern, able to increase step length and LE clearance with cues for heel strike but then exhibits decreased knee flexion during gait. Forwards/backwards ambulation 2 x 10 ft with RW and min A for balance with focus on LE strengthening. Stand pivot transfer w/c to recliner with RW and CGA. Pt left seated in recliner in room with needs in reach, quick release belt and chair alarm in place at end of session.  Therapy Documentation Precautions:  Precautions Precautions: Cervical Precaution Booklet Issued: No Required Braces or Orthoses:  Cervical Brace Cervical Brace: Soft collar (when up OOB) Restrictions Weight Bearing Restrictions: No    Therapy/Group: Individual Therapy   Excell Seltzer, PT, DPT  03/22/2020, 9:09 AM

## 2020-03-23 ENCOUNTER — Inpatient Hospital Stay (HOSPITAL_COMMUNITY): Payer: Medicare Other

## 2020-03-23 ENCOUNTER — Inpatient Hospital Stay (HOSPITAL_COMMUNITY): Payer: Medicare Other | Admitting: Physical Therapy

## 2020-03-23 LAB — CBC WITH DIFFERENTIAL/PLATELET
Abs Immature Granulocytes: 0.15 10*3/uL — ABNORMAL HIGH (ref 0.00–0.07)
Basophils Absolute: 0 10*3/uL (ref 0.0–0.1)
Basophils Relative: 0 %
Eosinophils Absolute: 0.4 10*3/uL (ref 0.0–0.5)
Eosinophils Relative: 4 %
HCT: 33.7 % — ABNORMAL LOW (ref 39.0–52.0)
Hemoglobin: 10.4 g/dL — ABNORMAL LOW (ref 13.0–17.0)
Immature Granulocytes: 2 %
Lymphocytes Relative: 16 %
Lymphs Abs: 1.6 10*3/uL (ref 0.7–4.0)
MCH: 26.7 pg (ref 26.0–34.0)
MCHC: 30.9 g/dL (ref 30.0–36.0)
MCV: 86.4 fL (ref 80.0–100.0)
Monocytes Absolute: 0.6 10*3/uL (ref 0.1–1.0)
Monocytes Relative: 6 %
Neutro Abs: 7.1 10*3/uL (ref 1.7–7.7)
Neutrophils Relative %: 72 %
Platelets: 261 10*3/uL (ref 150–400)
RBC: 3.9 MIL/uL — ABNORMAL LOW (ref 4.22–5.81)
RDW: 14.1 % (ref 11.5–15.5)
WBC: 9.9 10*3/uL (ref 4.0–10.5)
nRBC: 0 % (ref 0.0–0.2)

## 2020-03-23 LAB — BASIC METABOLIC PANEL
Anion gap: 7 (ref 5–15)
BUN: 28 mg/dL — ABNORMAL HIGH (ref 8–23)
CO2: 22 mmol/L (ref 22–32)
Calcium: 8.8 mg/dL — ABNORMAL LOW (ref 8.9–10.3)
Chloride: 107 mmol/L (ref 98–111)
Creatinine, Ser: 1.18 mg/dL (ref 0.61–1.24)
GFR calc Af Amer: >60 mL/min (ref >60)
GFR calc non Af Amer: >60 mL/min (ref >60)
Glucose, Bld: 128 mg/dL — ABNORMAL HIGH (ref 70–99)
Potassium: 6.2 mmol/L — ABNORMAL HIGH (ref 3.5–5.1)
Sodium: 136 mmol/L (ref 135–145)

## 2020-03-23 LAB — GLUCOSE, CAPILLARY
Glucose-Capillary: 120 mg/dL — ABNORMAL HIGH (ref 70–99)
Glucose-Capillary: 174 mg/dL — ABNORMAL HIGH (ref 70–99)
Glucose-Capillary: 110 mg/dL — ABNORMAL HIGH (ref 70–99)
Glucose-Capillary: 125 mg/dL — ABNORMAL HIGH (ref 70–99)
Glucose-Capillary: 51 mg/dL — ABNORMAL LOW (ref 70–99)

## 2020-03-23 MED ORDER — SODIUM ZIRCONIUM CYCLOSILICATE 10 G PO PACK
10.0000 g | PACK | Freq: Two times a day (BID) | ORAL | Status: DC
  Administered 2020-03-23 (×2): 10 g via ORAL
  Filled 2020-03-23 (×3): qty 1

## 2020-03-23 NOTE — Progress Notes (Signed)
[place] PHYSICAL MEDICINE & REHABILITATION PROGRESS NOTE   Subjective/Complaints:  Pt noted he got staples out.  Pt reports good BM last night with bowel program.   Asking when going to SNF-  Holding Lisinopril due to K+ of 6.2 even after receiving Lokelma- will give 3 doses  (BID) and recheck labs in AM  ROS:   Pt denies SOB, abd pain, CP, N/V/C/D, and vision changes    Objective:   No results found. Recent Labs    03/22/20 0511 03/23/20 0632  WBC 12.6* 9.9  HGB 10.7* 10.4*  HCT 34.2* 33.7*  PLT 264 261   Recent Labs    03/22/20 0511 03/23/20 0632  NA 138 136  K 5.9* 6.2*  CL 108 107  CO2 23 22  GLUCOSE 152* 128*  BUN 27* 28*  CREATININE 1.11 1.18  CALCIUM 8.7* 8.8*    Intake/Output Summary (Last 24 hours) at 03/23/2020 6045 Last data filed at 03/22/2020 1858 Gross per 24 hour  Intake 502 ml  Output 250 ml  Net 252 ml     Physical Exam: Vital Signs Blood pressure (!) 121/56, pulse 63, temperature 98.6 F (37 C), temperature source Oral, resp. rate 20, height 6\' 3"  (1.905 m), weight 98.7 kg, SpO2 99 %. General: pt awake, supine, watching TV, NAD HEENT: conjugate gaze Heart: RRR Chest: CTA B/L- no W/R/R- good air movement Abdomen: rotund, soft, NT, ND, (+)BS Extremities: No clubbing, cyanosis, or edema. Pulses are 2+ Psych: appropriate- less frustrated Neuro:  Comments: UEs- biceps 5-/5, WE 4+/5, triceps 4-/5, grip 4-/5, finger abd 3+/5 B/L LEs- R HF 4+/5; L HF 4/5- otherwise 4+/5 in KE, DF, PF and EHL B/L- L Ox3 Decreased sensation to light touch in L1 to S2 B/L- otherwise intact in UEs and torso B/L MAS of 1 in LUE and LEs- MAS of 0 in RUE--no changes Very mild Spasticity/clawing appearing of L>R hands- also notable is intrinsic muscle atrophy- L>R hands-less noticeable this AM Skin:staples out- steristrips in place    Assessment/Plan: 1. Functional deficits secondary to progressive incomplete tetraparesis s/p C3-6 fusion- due to  initial fall in 2015  which require 3+ hours per day of interdisciplinary therapy in a comprehensive inpatient rehab setting.  Physiatrist is providing close team supervision and 24 hour management of active medical problems listed below.  Physiatrist and rehab team continue to assess barriers to discharge/monitor patient progress toward functional and medical goals  Care Tool:  Bathing    Body parts bathed by patient: Right arm, Left arm, Chest, Abdomen, Front perineal area, Face, Right upper leg, Left upper leg, Right lower leg   Body parts bathed by helper: Left lower leg, Buttocks     Bathing assist Assist Level: Minimal Assistance - Patient > 75%     Upper Body Dressing/Undressing Upper body dressing   What is the patient wearing?: Pull over shirt    Upper body assist Assist Level: Maximal Assistance - Patient 25 - 49%    Lower Body Dressing/Undressing Lower body dressing      What is the patient wearing?: Incontinence brief, Pants     Lower body assist Assist for lower body dressing: Maximal Assistance - Patient 25 - 49%     Toileting Toileting Toileting Activity did not occur (Clothing management and hygiene only): Refused  Toileting assist Assist for toileting: Maximal Assistance - Patient 25 - 49% (standing in the [place])     Transfers Chair/bed transfer  Transfers assist     Chair/bed transfer  assist level: Contact Guard/Touching assist     Locomotion Ambulation   Ambulation assist      Assist level: Contact Guard/Touching assist Assistive device: Walker-rolling Max distance: 30   Walk 10 feet activity   Assist  Walk 10 feet activity did not occur: Safety/medical concerns  Assist level: Contact Guard/Touching assist Assistive device: Walker-rolling   Walk 50 feet activity   Assist Walk 50 feet with 2 turns activity did not occur: Safety/medical concerns  Assist level: Contact Guard/Touching assist Assistive device: Walker-rolling     Walk 150 feet activity   Assist Walk 150 feet activity did not occur: Safety/medical concerns         Walk 10 feet on uneven surface  activity   Assist Walk 10 feet on uneven surfaces activity did not occur: Safety/medical concerns   Assist level: Minimal Assistance - Patient > 75% Assistive device: Aeronautical engineer Will patient use wheelchair at discharge?: Yes Type of Wheelchair: Manual    Wheelchair assist level: Minimal Assistance - Patient > 75% Max wheelchair distance: 55'    Wheelchair 50 feet with 2 turns activity    Assist        Assist Level: Minimal Assistance - Patient > 75%   Wheelchair 150 feet activity     Assist      Assist Level: Total Assistance - Patient < 25%   Blood pressure (!) 121/56, pulse 63, temperature 98.6 F (37 C), temperature source Oral, resp. rate 20, height 6\' 3"  (1.905 m), weight 98.7 kg, SpO2 99 %.  Medical Problem List and Plan: 1.Cervical myelopathysecondary to cervical spinal cord compression status post cervical laminectomy and medialfacetectomyC3-4 4-5 posterior cervical arthrodesis C3-C6. Cervical collar at all times? 8/20- pt was given soft collar- refuses to wear it -patient may Shower with covering incision -ELOS/Goals: min A to Mod I, 3 weeks  8/30- looking for SNF- will needs labs at SNF due to continuing to be be high K+- also, will get Staples out of neck today- is due  -Continue CIR 2. Antithrombotics: -DVT/anticoagulation: .  vascular study ok  8/21 lovenox   8/26- will need for 3 months total, since not walking -antiplatelet therapy: N/A 3. Pain Management:Neurontin 300 mg twice daily,baclofen 5 mg 3 times daily,Cymbalta 60 mg daily, oxycodone as needed   8/22 holding scheduled gabapentin and baclofen given ileus  8/23- will restart Baclofen 5 mg TID since having bad muscle spasms of hands L>R- explained to pt.   8/25-  pain doing better/spasms better  8/26- haven't restart gabapentin- pt hasn't asked- so far.   8/28: uses oxycodone sparingly (once today). Wean to 5mg  dose.   8/29: has used oxycodone once today.  4. Mood:Provide emotional support -antipsychotic agents: N/A 5. Neuropsych: This patientiscapable of making decisions on hisown behalf. 6. Skin/Wound Care:Routine skin checks 7. Fluids/Electrolytes/Nutrition:Routine in and outs with follow-up chemistries 8. Hypertension. Hydralazine 25 mg twice daily, labetalol 100 mg twice daily, lisinopril 40 mg daily. Monitor with increased mobility  8/26- will decrease Labetalol to 50 mg BID since pulse is low  8/27- Pulse slightly better- 58 this AM- will monitor  8/28-8/29: stable  8/31- Pulse 63.  9. Diabetes mellitus with peripheral neuropathy. Hemoglobin A1c 7.1. NovoLog 12units 3 times daily with meals, Lantus insulin 55units daily. Check blood sugars before meals and at bedtime. Diabetic teaching CBG (last 3)  Recent Labs    03/22/20 1625 03/22/20 2137 03/23/20 0634  GLUCAP 151* 113* 120*  8/31- BGs much improved 110s-150s- con't regimen 10. CKD stage II. Creatinine baseline 1.46. Follow-up chemistries  8/20- Baseline is 1.4-   8/22 Cr 1 today, holding lasix for now  -kayexalate for hyperkalemia  8/25- recheck labs in AM  8/26- Labs not back yet- will monitor  8/27- Cr 1.27- which is around baseline; K+ 5.1- will give some Lokelma again.   8/30- K+ 5.9- even after Lokelma on Friday- will speak with pharmacy might need to schedule low dose- will give another dose today 11. Morbid obesity. BMI 30.09. Dietary follow-up 12. History of alcohol tobacco cocaine use. Providing counseling 13. Diastolic congestive heart failure. Lasix 40 mg daily. Monitor with increased mobility   Filed Weights   03/21/20 0442 03/22/20 0356 03/23/20 0346  Weight: 99.7 kg 100.5 kg 98.7 kg    8/22 weights down  8/23- weight up  1 kg- in spite of pooping.  8/27- Weight 98.7 kg- stable  8/30- Weight up slightly 99.7- on 8/29- however not more than original weight.  14. Hyperlipidemia. Lipitor 15. Neurogenic bowel/ileus and bladder- goes every 1 week - which is likely the slowing of neurogenic bowel - need to improve speed; also has urinary urgency, which is also neurogenic- will monitor and treat as required.   8/20- gave Sorbitol to get pt to have BM today- has been 7-8 days- usually goes 1x/week  8/22 colonic ileus on xray and CT. No SBO  8/23- still has moderate stool burden- will give another dose Sorbitol and then change back to regular diet if poops. Also change reglan to PO 10 mg TID-AC x 2 days- since lost IV access.   8/24- didn't have another BM- will give 1/2 bottle Mg citrate at noon and by dinner, change diet back to regular DM diet.   8/25- 2 large BMs yesterday- put back to regular/DM diet-  8/27- will place on bowel program, OK with pt- will add lidocaine jelly as well   8/31- large BM last night 16. Spasticity- on Baclofen 5mg  TID- adequately controlled- mainly annoying, not painful- will monitor muscle spasms. 17. Hyperkalemia  8/20- Not on any K+ replacement- K+ is 5.1-max level of normal- will monitor closely.    8/22 kayexalate today  8/30- gave Lokelma 10 G today- and will recheck labs in AM  8/31- K+ up to 6.2 even though got Lokelma 10 G x1 yesterday- will hold Lisinopril and give 3 doses (BID) of Lokelma and recheck in AM 18. Leukocytosis-   8/20- on Decadron slow taper- WBC 20k-   will check U/A and Cx just in case- afebrile- but will monitor for sx's of infection.    8/21-22 ua +, ucx pending--> await results   -suspect ileus playing a role too  8/23- U/A (+) somewhat- U Cx growing >100k E Coli- WBC down to 16.6k  8/24- UTI- EColi- will start Bactrim for UTI- based on Cx's- Bactrim DS BID x 7 days.  8/25- no complaints of Sx's.   8/27- WBC down to 13.3k  8/30- WBC down to 12.6- on  decadron taper    LOS: 12 days A FACE TO FACE EVALUATION WAS PERFORMED  Khamille Beynon 03/23/2020, 8:33 AM

## 2020-03-23 NOTE — Progress Notes (Signed)
Patient ID: [name], male   DOB: 1961/01/02, 59 y.o.   MRN: [id number]  Per attending, pt possibly ready for d/c by end of the week. SW to follow-up with SNF to discuss bed offers.   SW spoke left message for [name]/Admissions with [name] [phone]) to discuss bed offer. SW waiting on follow-up.   Loralee Pacas, MSW, [place] Office: [phone] Cell: [phone] Fax: [phone]

## 2020-03-23 NOTE — Progress Notes (Signed)
Physical Therapy Session Note  Patient Details  Name: [name] MRN: [id number] Date of Birth: [birth year]  Today's Date: 03/23/2020 PT Individual Time: 0900-1000 and 1500-1530 PT Individual Time Calculation (min): 60 min  And 30 mins  Short Term Goals: Week 1:  PT Short Term Goal 1 (Week 1): pt to demonstate min A x1 bed mobility consistently PT Short Term Goal 1 - Progress (Week 1): Progressing toward goal PT Short Term Goal 2 (Week 1): pt to demonstrate min A x1 functional transfers with LRAD consistently PT Short Term Goal 2 - Progress (Week 1): Progressing toward goal PT Short Term Goal 3 (Week 1): pt to demonstrate 22' with LRAD mod A x1 PT Short Term Goal 3 - Progress (Week 1): Progressing toward goal PT Short Term Goal 4 (Week 1): pt to demonstrate dynamic sitting balance for functional tasks at min A PT Short Term Goal 4 - Progress (Week 1): Progressing toward goal Week 2:  PT Short Term Goal 1 (Week 2): pt to demonstrate dynamic sitting balance for functional tasks at min A PT Short Term Goal 2 (Week 2): pt to demonstrate min A x1 functional transfers with LRAD consistently PT Short Term Goal 3 (Week 2): pt to demonstate min A x1 bed mobility consistently PT Short Term Goal 4 (Week 2): pt to demonstrate gait training with LRAD for 75' min A     Skilled Therapeutic Interventions/Progress Updates:    SESSION 1: pt received in [facility] and agreeable to therapy but reported he felt very tired this morning and "weak" but agreeable to sitting activity at this time time and "then see how I feel". Pt directed in [place] mobility in manual WC with BUE for forward propulsion with multiple turns and improved ease of obstacles navigation this AM for total of 150' and x3 rest breaks during this distance. Pt directed in seated BLE strengthening exercises with 3# hip flexion, knee extension, hip abduction and adduction with BTB 2x20 each and rest breaks post each set to improve technique for optimal  strength gains. Pt directed in x5 STS from [facility] to RW at [facility] grossly and one instance of min A to come to full standing with VC for setup, B hand placement and increased anterior momentum for improved ascending from chair, once in standing pt directed in to attempt to stand with BUE support for 30s each for improved standing tolerance and safety, stability with standing to decreased fall risk. Pt left in room in [facility], alarm belt set, All needs in reach and in good condition. Call light in hand.    SESSION 2: pt received in bed and agreeable to therapy. Pt directed in supine>sit min A, STS to RW min A transferred to [facility] with RW at min A and directed in forward propulsion in [facility] for 150' CGA with VC for tech. Pt directed in gait training for 100' x2+150' +75' with RW at min A-CGA with VC frequently for inc step height and stride length. Pt directed in [place] mobility for 75' CGA with VC for tech; STS transfer to RW at min A and transfer to EOB min A with RW, sit>supine min A as pt requested to return to bed at end of session. Pt denied pain at start and end of session. Pt left in bed, All needs in reach and in good condition. Call light in hand.  And alarm set.   Therapy Documentation Precautions:  Precautions Precautions: Cervical Precaution Booklet Issued: No Required Braces or Orthoses: Cervical  Brace Cervical Brace: Soft collar (when up OOB) Restrictions Weight Bearing Restrictions: No    Therapy/Group: Individual Therapy  [name] 03/23/2020, 10:01 AM

## 2020-03-23 NOTE — Patient Care Conference (Signed)
Inpatient RehabilitationTeam Conference and Plan of Care Update Date: 03/23/2020   Time: 11:20 AM    Patient Name: [name]      Medical Record Number: [id number]  Date of Birth: July 11, 1961 Sex: Male         Room/Bed: 4M09C/4M09C-01 Payor Info: Payor: MEDICARE / Plan: MEDICARE PART A AND B / Product Type: *No Product type* /    Admit Date/Time:  03/11/2020  5:34 PM  Primary Diagnosis:  Cervical myelopathy [facility])  Hospital Problems: Principal Problem:   Cervical myelopathy ([place]) Active Problems:   Hyperlipidemia associated with type 2 diabetes mellitus [facility])    Expected Discharge Date: Expected Discharge Date:  (SNF placement)  Team Members Present: Physician leading conference: Dr. Courtney Heys Care Coodinator Present: Loralee Pacas, LCSWA;Avrie Kedzierski Creig Hines, RN, BSN, CRRN Nurse Present: Benjie Karvonen, RN PT Present: Stacy Gardner, PT OT Present: Roanna Epley, [place], OT PPS Coordinator present : Ileana Ladd, Burna Mortimer, SLP     Current Status/Progress Goal Weekly Team Focus  Bowel/Bladder   Pt continent of B/B with intermittent episodes of incontinence. Bowel program. LBM 03/22/2020  Encourage continence of B/B  Assess B/B every shift and PRN   Swallow/Nutrition/ Hydration             ADL's   UB bathing/dressing-min A; LB bathing/dressing-mod A/max A at bed level; functional transfers-min A with RW, toileting-max A; decreased [place] and BUE weakness  downgraded to min A overall; mod a for toileting  BADL retraining, functional transfers, education, activity tolerance, safety awareness   Mobility   Supervision rolling, mod A supine to sit, min A to stand but can require more at times, transfers CGA with RW, gait up to 77 ft with RW CGA  min A gait training LRAD for 100'; CGA transfers and bed mob  strengthening, endurance, transfers, gait training, LE coordination   Communication             Safety/Cognition/ Behavioral Observations             Pain   Intermittently rating pain 6-7/10 before pain medication. Pain managed with PRN oxycodone  Pain rating <3  Assess pain every shift and PRN   Skin   Healing surgical wound to posterior neck  Continue healing of surgical wound and no skin breakdown  Assess skin every shift and PRN     Discharge Planning:  D/c pending bed offer at [facility] and when he is medically ready as he just began bowel program on Friday (8/27)   Team Discussion: Continent B/B, liquid stools due to Potassium. Bowel program however, is successful. Staples removed, incision CDI.  Patient on target to meet rehab goals: yes  *See Care Plan and progress notes for long and short-term goals.   Revisions to Treatment Plan:  None  Teaching Needs: Continue family education  Current Barriers to Discharge: Inaccessible home environment, Decreased caregiver support, Incontinence, Neurogenic bowel and bladder, Weight and Weight bearing restrictions  Possible Resolutions to Barriers: MD working on Potassium levels, continue with bowel program, possible placement on Friday.     Medical Summary Current Status: K+ 6.2 even after Lokelma- gave 3 more doses- bowel program working- staples out- looks good; bladder continent-  Barriers to Discharge: Decreased family/caregiver support;Home enviroment access/layout;Weight bearing restrictions;Weight;Neurogenic Bowel & Bladder;Incontinence  Barriers to Discharge Comments: liquid stools this AM- continent bladder; initiated gait training- mod-max A ADLs LB;- hopeful placement Friday Possible Resolutions to Barriers/Weekly Focus: K+- working on -holding lisinopril; main barrier  Continued Need for Acute Rehabilitation Level of Care: The patient requires daily medical management by a physician with specialized training in physical medicine and rehabilitation for the following reasons: Direction of a multidisciplinary physical rehabilitation program to maximize functional  independence : Yes Medical management of patient stability for increased activity during participation in an intensive rehabilitation regime.: Yes Analysis of laboratory values and/or radiology reports with any subsequent need for medication adjustment and/or medical intervention. : Yes   I attest that I was present, lead the team conference, and concur with the assessment and plan of the team.   Cristi Loron 03/23/2020, 3:12 PM

## 2020-03-23 NOTE — Progress Notes (Signed)
Bowel program done on dayshift resulting in large brown stool

## 2020-03-23 NOTE — Progress Notes (Signed)
Physical Therapy Session Note  Patient Details  Name: [name] MRN: [id number] Date of Birth: [birth year]  Today's Date: 03/23/2020 PT Individual Time:  0800-0855 PT Minutes: 79 min      Short Term Goals: Week 2:  PT Short Term Goal 1 (Week 2): pt to demonstrate dynamic sitting balance for functional tasks at min A PT Short Term Goal 2 (Week 2): pt to demonstrate min A x1 functional transfers with LRAD consistently PT Short Term Goal 3 (Week 2): pt to demonstate min A x1 bed mobility consistently PT Short Term Goal 4 (Week 2): pt to demonstrate gait training with LRAD for 75' min A  Skilled Therapeutic Interventions/Progress Updates:    Pt received seated in bed, appears more lethargic this AM and reports not sleeping well. Pt agreeable to participate in therapy session as able. No complaints of pain. Sidelying to sitting EOB with CGA with cues for log rolling and use of UE for trunk elevation. Pt is setup A to doff his scrub top and don a new top while seated EOB. Pt frequently lays back down due to fatigue and needs cues to sit back up to participate in session. Sit to stand with min A to RW. Stand pivot transfer bed to w/c with RW and CGA. Sit to stand with min A to sink. Pt able to doff pants with close Supervision for standing balance. Pt then found to be incontinent of BM. Pt is able to assist with pericare with set up A and needs assist for thoroughness. Pt then again has incontinence of loose stools while in standing. RN notified and reports it is 2/2 medication. Assisted pt with donning a brief and new pants. Manual w/c propulsion 2 x 50 ft with use of BUE and Supervision. Pt requires frequent rest breaks due to fatigue and max encouragement for participation this AM due to fatigue. Pt left seated in w/c in room with needs in reach, quick release belt and chair alarm in place at end of session.  Therapy Documentation Precautions:  Precautions Precautions: Cervical Precaution  Booklet Issued: No Required Braces or Orthoses: Cervical Brace Cervical Brace: Soft collar (when up OOB) Restrictions Weight Bearing Restrictions: No    Therapy/Group: Individual Therapy   Excell Seltzer, PT, DPT  03/23/2020, 7:31 AM

## 2020-03-23 NOTE — Progress Notes (Signed)
Bowel program performed as ordered including digital stim and suppository. No immediate results this shift.

## 2020-03-23 NOTE — Progress Notes (Signed)
Occupational Therapy Session Note  Patient Details  Name: [name] MRN: [id number] Date of Birth: 06-Mar-1961  Today's Date: 03/23/2020 OT Individual Time: 1300-1355 OT Individual Time Calculation (min): 55 min    Short Term Goals: Week 2:  OT Short Term Goal 1 (Week 2): Pt will be able to consistently complete sit to stands with min A or less to increase independence with toileting. OT Short Term Goal 2 (Week 2): Pt will be able to manage clothing over hips with min A. OT Short Term Goal 3 (Week 2): Pt will be able to don pants over feet with min A using reacher. OT Short Term Goal 4 (Week 2): Pt will be able to sit to EOB with S.  Skilled Therapeutic Interventions/Progress Updates:    Pt resting in w/c upon arrival. Pt stated he had "washed up" and changed clothing earlier.  OT intervention with focus on bed mobility, sit<>stand, standing balance, and w/c mobility to increase independence with BADLs. Sit<>stand with RW fluctuated between CGA and mod A.  Pt performed sit<>stand X 5. Pt stood at hi-lo table for table activities with focus on standing balance and BUE strengthening. Pt also engaged in [employer] X 2.  Pt amb with RW to retrieve bean bags from floor with reacher.  Pt propelled w/c approx 100' when returning to room. Pt amb from hallway to bed and returned to bed.  Sit>supine with supervision.  Pt remained in bed with all needs within reach and bed alarm activated.   Therapy Documentation Precautions:  Precautions Precautions: Cervical Precaution Booklet Issued: No Required Braces or Orthoses: Cervical Brace Cervical Brace: Soft collar (when up OOB) Restrictions Weight Bearing Restrictions: No  Pain: Pt denies pain this afternoon  Therapy/Group: Individual Therapy  Leroy Libman 03/23/2020, 1:56 PM

## 2020-03-24 ENCOUNTER — Inpatient Hospital Stay (HOSPITAL_COMMUNITY): Payer: Medicare Other

## 2020-03-24 ENCOUNTER — Inpatient Hospital Stay (HOSPITAL_COMMUNITY): Payer: Medicare Other | Admitting: Physical Therapy

## 2020-03-24 LAB — BASIC METABOLIC PANEL
Anion gap: 10 (ref 5–15)
BUN: 23 mg/dL (ref 8–23)
CO2: 21 mmol/L — ABNORMAL LOW (ref 22–32)
Calcium: 8.6 mg/dL — ABNORMAL LOW (ref 8.9–10.3)
Chloride: 102 mmol/L (ref 98–111)
Creatinine, Ser: 1.11 mg/dL (ref 0.61–1.24)
GFR calc Af Amer: >60 mL/min (ref >60)
GFR calc non Af Amer: >60 mL/min (ref >60)
Glucose, Bld: 185 mg/dL — ABNORMAL HIGH (ref 70–99)
Potassium: 5.5 mmol/L — ABNORMAL HIGH (ref 3.5–5.1)
Sodium: 133 mmol/L — ABNORMAL LOW (ref 135–145)

## 2020-03-24 LAB — GLUCOSE, CAPILLARY
Glucose-Capillary: 144 mg/dL — ABNORMAL HIGH (ref 70–99)
Glucose-Capillary: 83 mg/dL (ref 70–99)
Glucose-Capillary: 172 mg/dL — ABNORMAL HIGH (ref 70–99)
Glucose-Capillary: 203 mg/dL — ABNORMAL HIGH (ref 70–99)

## 2020-03-24 MED ORDER — INSULIN ASPART 100 UNIT/ML ~~LOC~~ SOLN: 6.0000 [IU] | Freq: Three times a day (TID) | SUBCUTANEOUS | 11 refills | Status: DC

## 2020-03-24 MED ORDER — SODIUM ZIRCONIUM CYCLOSILICATE 10 G PO PACK
10.0000 g | PACK | Freq: Two times a day (BID) | ORAL | Status: AC
  Administered 2020-03-24: 10 g via ORAL
  Filled 2020-03-24 (×2): qty 1

## 2020-03-24 MED ORDER — ENOXAPARIN SODIUM 40 MG/0.4ML ~~LOC~~ SOLN: 40.0000 mg | SUBCUTANEOUS | Status: DC

## 2020-03-24 MED ORDER — LABETALOL HCL 100 MG PO TABS: 50.0000 mg | ORAL_TABLET | Freq: Two times a day (BID) | ORAL | Status: DC

## 2020-03-24 MED ORDER — OXYCODONE HCL 5 MG PO TABS
5.0000 mg | ORAL_TABLET | ORAL | 0 refills | Status: DC | PRN
Start: 2020-03-24 — End: 2020-03-24

## 2020-03-24 MED ORDER — BACLOFEN 5 MG PO TABS: 5.0000 mg | ORAL_TABLET | Freq: Three times a day (TID) | ORAL | Status: DC

## 2020-03-24 MED ORDER — SENNA 8.6 MG PO TABS: 1.0000 | ORAL_TABLET | Freq: Two times a day (BID) | ORAL | 0 refills | Status: DC

## 2020-03-24 MED ORDER — DEXAMETHASONE 1 MG PO TABS: 1.0000 mg | ORAL_TABLET | Freq: Three times a day (TID) | ORAL | Status: AC

## 2020-03-24 MED ORDER — HYDRALAZINE HCL 25 MG PO TABS: 25.0000 mg | ORAL_TABLET | Freq: Two times a day (BID) | ORAL | Status: DC

## 2020-03-24 MED ORDER — OXYCODONE HCL 5 MG PO TABS
5.0000 mg | ORAL_TABLET | ORAL | 0 refills | Status: DC | PRN
Start: 2020-03-24 — End: 2020-05-12

## 2020-03-24 MED ORDER — BISACODYL 10 MG RE SUPP: 10.0000 mg | Freq: Every day | RECTAL | 0 refills | Status: AC

## 2020-03-24 MED ORDER — ACETAMINOPHEN 325 MG PO TABS: 650.0000 mg | ORAL_TABLET | ORAL | Status: DC | PRN

## 2020-03-24 MED ORDER — INSULIN GLARGINE 100 UNIT/ML ~~LOC~~ SOLN: 43.0000 [IU] | Freq: Every day | SUBCUTANEOUS | 11 refills | Status: DC

## 2020-03-24 MED ORDER — INSULIN GLARGINE 100 UNIT/ML ~~LOC~~ SOLN
43.0000 [IU] | Freq: Every day | SUBCUTANEOUS | Status: DC
  Administered 2020-03-24 – 2020-03-26 (×3): 43 [IU] via SUBCUTANEOUS
  Filled 2020-03-24 (×3): qty 0.43

## 2020-03-24 NOTE — Progress Notes (Signed)
Occupational Therapy Session Note  Patient Details  Name: [name] MRN: [id number] Date of Birth: [birth year]  Today's Date: 03/24/2020 OT Individual Time: 1100-1200 OT Individual Time Calculation (min): 60 min    Short Term Goals: Week 2:  OT Short Term Goal 1 (Week 2): Pt will be able to consistently complete sit to stands with min A or less to increase independence with toileting. OT Short Term Goal 2 (Week 2): Pt will be able to manage clothing over hips with min A. OT Short Term Goal 3 (Week 2): Pt will be able to don pants over feet with min A using reacher. OT Short Term Goal 4 (Week 2): Pt will be able to sit to EOB with S.  Skilled Therapeutic Interventions/Progress Updates:    Pt resting in bed upon arrival and agreeable to therapy.  OT intervention with focus on bed mobility, sit<>stand, standing balance, functional amb with RW, BADL retraining, activity tolerance, and safety awareness to increase independence with BADLs. Supine>sit EOB and sit with supervision.  Sit<>stand from EOB, shower seat, and w/c with CGA. Standing balance with CGA for LB bathing/dressing. Amb with RW to/from bathroom with CGA. LB dressing with min A for socks. Pt requires more then a reasonable amount of time to complete tasks.  Pt requires min verbal cues for safety awareness.  Pt remained seated in w/c with all needs within reach and belt alarm activated.   Therapy Documentation Precautions:  Precautions Precautions: Cervical Precaution Booklet Issued: No Required Braces or Orthoses: Cervical Brace Cervical Brace: Soft collar (when up OOB) Restrictions Weight Bearing Restrictions: No Pain:  Pt denies pain this morning  Therapy/Group: Individual Therapy  Leroy Libman 03/24/2020, 12:07 PM

## 2020-03-24 NOTE — Progress Notes (Signed)
Hypoglycemic Event  CBG: 51  Treatment: 1 moon pie and 1 can of regular coke (180 mL) Symptoms: Hypoglycemic, patient reported being hungry  Follow-up CBG: Time: 2122 CBG Result:125  Possible Reasons for Event: Patient CBG before dinner is 110 and had 6 units of insulin NOVOLOG for meal coverage due to patient ate 88% of dinner  Comments/MD notified: Danella Sensing, MD notified, advise to continue monitor patient for signs of hypoglycemic  [place]

## 2020-03-24 NOTE — Progress Notes (Signed)
Occupational Therapy Session Note  Patient Details  Name: [name] MRN: [id number] Date of Birth: 11/11/1960  Today's Date: 03/24/2020 OT Individual Time: 0700-0800 OT Individual Time Calculation (min): 60 min    Short Term Goals: Week 2:  OT Short Term Goal 1 (Week 2): Pt will be able to consistently complete sit to stands with min A or less to increase independence with toileting. OT Short Term Goal 2 (Week 2): Pt will be able to manage clothing over hips with min A. OT Short Term Goal 3 (Week 2): Pt will be able to don pants over feet with min A using reacher. OT Short Term Goal 4 (Week 2): Pt will be able to sit to EOB with S.  Skilled Therapeutic Interventions/Progress Updates:    Pt resting in bed upon arrival.  Pt deferred bathing/dressing until later session.  OT intervention with focus on BLE/BUE therex for increased endurance/strengthening-7 mins NuStep level 4 and 7 mins BUE SciFit UE ergometer. Pt engaged in standing balance activities tossing horseshoes and bean bags towards target.  Pt pushed RW to L side and used LUE for support on RW when tossing. Pt requires rest breaks X 4 during task.  Pt amb with RW and used reacher to retrieve objects from floor. Pt returned to room and remained in w/c with all needs within reach and belt alarm activated.   Therapy Documentation Precautions:  Precautions Precautions: Cervical Precaution Booklet Issued: No Required Braces or Orthoses: Cervical Brace Cervical Brace: Soft collar (when up OOB) Restrictions Weight Bearing Restrictions: No  Pain:  Pt denies pain this morning.   Therapy/Group: Individual Therapy  Leroy Libman 03/24/2020, 8:01 AM

## 2020-03-24 NOTE — Plan of Care (Signed)
°  Problem: Consults Goal: RH GENERAL PATIENT EDUCATION Description: See Patient Education module for education specifics. Outcome: Progressing Goal: Skin Care Protocol Initiated - if Braden Score 18 or less Description: If consults are not indicated, leave blank or document N/A Outcome: Progressing Goal: Nutrition Consult-if indicated Outcome: Progressing Goal: Diabetes Guidelines if Diabetic/Glucose > 140 Description: If diabetic or lab glucose is > 140 mg/dl - Initiate Diabetes/Hyperglycemia Guidelines & Document Interventions  Outcome: Progressing   Problem: RH BOWEL ELIMINATION Goal: RH STG MANAGE BOWEL WITH ASSISTANCE Description: STG Manage Bowel with mod Assistance. Outcome: Progressing Goal: RH STG MANAGE BOWEL W/MEDICATION W/ASSISTANCE Description: STG Manage Bowel with Medication with mod Assistance. Outcome: Progressing   Problem: RH BLADDER ELIMINATION Goal: RH STG MANAGE BLADDER WITH ASSISTANCE Description: STG Manage Bladder With mod Assistance Outcome: Progressing   Problem: RH SKIN INTEGRITY Goal: RH STG SKIN FREE OF INFECTION/BREAKDOWN Outcome: Progressing Goal: RH STG MAINTAIN SKIN INTEGRITY WITH ASSISTANCE Description: STG Maintain Skin Integrity With mod Assistance. Outcome: Progressing Goal: RH STG ABLE TO PERFORM INCISION/WOUND CARE W/ASSISTANCE Description: STG Able To Perform Incision/Wound Care With max Assistance. Outcome: Progressing   Problem: RH SAFETY Goal: RH STG ADHERE TO SAFETY PRECAUTIONS W/ASSISTANCE/DEVICE Description: STG Adhere to Safety Precautions With min Assistance/Device. Outcome: Progressing Goal: RH STG DECREASED RISK OF FALL WITH ASSISTANCE Description: STG Decreased Risk of Fall With min Assistance. Outcome: Progressing   Problem: RH PAIN MANAGEMENT Goal: RH STG PAIN MANAGED AT OR BELOW PT'S PAIN GOAL Description: Less than 3 out of 10  Outcome: Progressing   Problem: RH KNOWLEDGE DEFICIT GENERAL Goal: RH STG INCREASE  KNOWLEDGE OF SELF CARE AFTER HOSPITALIZATION Description: Pt will be able to verbalize self care while at home with min assist   Outcome: Progressing

## 2020-03-24 NOTE — Progress Notes (Signed)
[place] PHYSICAL MEDICINE & REHABILITATION PROGRESS NOTE   Subjective/Complaints:  Pt had hypoglycemic event.  Got down to 51.  K+ down to 5.5- still an issue.     ROS:   Pt denies SOB, abd pain, CP, N/V/C/D, and vision changes    Objective:   No results found. Recent Labs    03/22/20 0511 03/23/20 0632  WBC 12.6* 9.9  HGB 10.7* 10.4*  HCT 34.2* 33.7*  PLT 264 261   Recent Labs    03/23/20 0632 03/24/20 0718  NA 136 133*  K 6.2* 5.5*  CL 107 102  CO2 22 21*  GLUCOSE 128* 185*  BUN 28* 23  CREATININE 1.18 1.11  CALCIUM 8.8* 8.6*    Intake/Output Summary (Last 24 hours) at 03/24/2020 0843 Last data filed at 03/24/2020 0803 Gross per 24 hour  Intake 662 ml  Output 625 ml  Net 37 ml     Physical Exam: Vital Signs Blood pressure (!) 117/52, pulse 68, temperature 98.2 F (36.8 C), resp. rate 15, height 6\' 3"  (1.905 m), weight 100 kg, SpO2 100 %. General: pt awake, on arm bike in gym, appropriate, with OT, NAD HEENT: conjugate gaze Heart: RRR Chest: CTA B/L- no W/R/R- good air movement Abdomen: Soft, NT, ND, (+)BS - protuberant Extremities: No clubbing, cyanosis, or edema. Pulses are 2+ Psych: not appropriate this AM Neuro:  Comments: UEs- biceps 5-/5, WE 4+/5, triceps 4-/5, grip 4-/5, finger abd 3+/5 B/L LEs- R HF 4+/5; L HF 4/5- otherwise 4+/5 in KE, DF, PF and EHL B/L- L Ox3 Decreased sensation to light touch in L1 to S2 B/L- otherwise intact in UEs and torso B/L MAS of 1 in LUE and LEs- MAS of 0 in RUE--no changes Very mild Spasticity/clawing appearing of L>R hands- also notable is intrinsic muscle atrophy- L>R hands-less noticeable this AM Skin:staples out- steristrips in place    Assessment/Plan: 1. 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  Physiatrist is  providing close team supervision and 24 hour management of active medical problems listed below.  Physiatrist and rehab team continue to assess barriers to discharge/monitor patient progress toward functional and medical goals  Care Tool:  Bathing    Body parts bathed by patient: Right arm, Left arm, Chest, Abdomen, Front perineal area, Face, Right upper leg, Left upper leg, Right lower leg   Body parts bathed by helper: Left lower leg, Buttocks     Bathing assist Assist Level: Minimal Assistance - Patient > 75%     Upper Body Dressing/Undressing Upper body dressing   What is the patient wearing?: Pull over shirt    Upper body assist Assist Level: Maximal Assistance - Patient 25 - 49%    Lower Body Dressing/Undressing Lower body dressing      What is the patient wearing?: Incontinence brief, Pants     Lower body assist Assist for lower body dressing: Maximal Assistance - Patient 25 - 49%     Toileting Toileting Toileting Activity did not occur (Clothing management and hygiene only): Refused  Toileting assist Assist for toileting: Maximal Assistance - Patient 25 - 49% (standing in the [place])     Transfers Chair/bed transfer  Transfers assist     Chair/bed transfer assist level: Contact Guard/Touching assist     Locomotion Ambulation   Ambulation assist      Assist level:  Contact Guard/Touching assist Assistive device: Walker-rolling Max distance: 30   Walk 10 feet activity   Assist  Walk 10 feet activity did not occur: Safety/medical concerns  Assist level: Contact Guard/Touching assist Assistive device: Walker-rolling   Walk 50 feet activity   Assist Walk 50 feet with 2 turns activity did not occur: Safety/medical concerns  Assist level: Contact Guard/Touching assist Assistive device: Walker-rolling    Walk 150 feet activity   Assist Walk 150 feet activity did not occur: Safety/medical concerns         Walk 10 feet on uneven  surface  activity   Assist Walk 10 feet on uneven surfaces activity did not occur: Safety/medical concerns   Assist level: Minimal Assistance - Patient > 75% Assistive device: Aeronautical engineer Will patient use wheelchair at discharge?: Yes Type of Wheelchair: Manual    Wheelchair assist level: Supervision/Verbal cueing Max wheelchair distance: 50'    Wheelchair 50 feet with 2 turns activity    Assist        Assist Level: Supervision/Verbal cueing   Wheelchair 150 feet activity     Assist      Assist Level: Total Assistance - Patient < 25%   Blood pressure (!) 117/52, pulse 68, temperature 98.2 F (36.8 C), resp. rate 15, height 6\' 3"  (1.905 m), weight 100 kg, SpO2 100 %.  Medical Problem List and Plan: 1.Cervical myelopathysecondary to cervical spinal cord compression status post cervical laminectomy and medialfacetectomyC3-4 4-5 posterior cervical arthrodesis C3-C6. Cervical collar at all times? 8/20- pt was given soft collar- refuses to wear it -patient may Shower with covering incision -ELOS/Goals: min A to Mod I, 3 weeks  8/30- looking for SNF- will needs labs at SNF due to continuing to be be high K+- also, will get [employer] out of neck today- is due  -Continue CIR 2. Antithrombotics: -DVT/anticoagulation: .  vascular study ok  8/21 lovenox   8/26- will need for 3 months total, since not walking -antiplatelet therapy: N/A 3. Pain Management:Neurontin 300 mg twice daily,baclofen 5 mg 3 times daily,Cymbalta 60 mg daily, oxycodone as needed   8/22 holding scheduled gabapentin and baclofen given ileus  8/23- will restart Baclofen 5 mg TID since having bad muscle spasms of hands L>R- explained to pt.   8/25- pain doing better/spasms better  8/26- haven't restart gabapentin- pt hasn't asked- so far.   8/28: uses oxycodone sparingly (once today). Wean to 5mg  dose.   8/29: has used  oxycodone once today.  4. Mood:Provide emotional support -antipsychotic agents: N/A 5. Neuropsych: This patientiscapable of making decisions on hisown behalf. 6. Skin/Wound Care:Routine skin checks 7. Fluids/Electrolytes/Nutrition:Routine in and outs with follow-up chemistries 8. Hypertension. Hydralazine 25 mg twice daily, labetalol 100 mg twice daily, lisinopril 40 mg daily. Monitor with increased mobility  8/26- will decrease Labetalol to 50 mg BID since pulse is low  8/27- Pulse slightly better- 58 this AM- will monitor  8/28-8/29: stable  8/31- Pulse 63.  9/1- held lisinopril BP better  9. Diabetes mellitus with peripheral neuropathy. Hemoglobin A1c 7.1. NovoLog 12units 3 times daily with meals, Lantus insulin 55units daily. Check blood sugars before meals and at bedtime. Diabetic teaching CBG (last 3)  Recent Labs    03/23/20 2054 03/23/20 2122 03/24/20 0618  GLUCAP 51* 125* 144*    8/31- BGs much improved 110s-150s- con't regimen  9/1- decadron is weaning, so BGs went low last night- Reduced Lantus from 46 to 43 units.  10. CKD stage II. Creatinine baseline 1.46. Follow-up chemistries  8/20- Baseline is 1.4-   8/22 Cr 1 today, holding lasix for now  -kayexalate for hyperkalemia  8/25- recheck labs in AM  8/26- Labs not back yet- will monitor  8/27- Cr 1.27- which is around baseline; K+ 5.1- will give some Lokelma again.   8/30- K+ 5.9- even after Lokelma on Friday- will speak with pharmacy might need to schedule low dose- will give another dose today  9/1- K+ 5.5 after 2 doses of Lokelma- another dose this AM- wil right for more- to bring down into better range 11. Morbid obesity. BMI 30.09. Dietary follow-up 12. History of alcohol tobacco cocaine use. Providing counseling 13. Diastolic congestive heart failure. Lasix 40 mg daily. Monitor with increased mobility   Filed Weights   03/22/20 0356 03/23/20 0346 03/24/20 0500  Weight:  100.5 kg 98.7 kg 100 kg    8/22 weights down  8/23- weight up 1 kg- in spite of pooping.  8/27- Weight 98.7 kg- stable  8/30- Weight up slightly 99.7- on 8/29- however not more than original weight.   9/1- Weight elevated somewhat 100kg- but still in stable range.  14. Hyperlipidemia. Lipitor 15. Neurogenic bowel/ileus and bladder- goes every 1 week - which is likely the slowing of neurogenic bowel - need to improve speed; also has urinary urgency, which is also neurogenic- will monitor and treat as required.   8/20- gave Sorbitol to get pt to have BM today- has been 7-8 days- usually goes 1x/week  8/22 colonic ileus on xray and CT. No SBO  8/23- still has moderate stool burden- will give another dose Sorbitol and then change back to regular diet if poops. Also change reglan to PO 10 mg TID-AC x 2 days- since lost IV access.   8/24- didn't have another BM- will give 1/2 bottle Mg citrate at noon and by dinner, change diet back to regular DM diet.   8/25- 2 large BMs yesterday- put back to regular/DM diet-  8/27- will place on bowel program, OK with pt- will add lidocaine jelly as well   8/31- large BM last night 16. Spasticity- on Baclofen 5mg  TID- adequately controlled- mainly annoying, not painful- will monitor muscle spasms. 17. Hyperkalemia  8/20- Not on any K+ replacement- K+ is 5.1-max level of normal- will monitor closely.    8/22 kayexalate today  8/30- gave Lokelma 10 G today- and will recheck labs in AM  8/31- K+ up to 6.2 even though got Lokelma 10 G x1 yesterday- will hold Lisinopril and give 3 doses (BID) of Lokelma and recheck in AM 18. Leukocytosis-   8/20- on Decadron slow taper- WBC 20k-   will check U/A and Cx just in case- afebrile- but will monitor for sx's of infection.    8/21-22 ua +, ucx pending--> await results   -suspect ileus playing a role too  8/23- U/A (+) somewhat- U Cx growing >100k E Coli- WBC down to 16.6k  8/24- UTI- EColi- will start Bactrim for  UTI- based on Cx's- Bactrim DS BID x 7 days.  8/25- no complaints of Sx's.   8/27- WBC down to 13.3k  8/30- WBC down to 12.6- on decadron taper   9/1- WBC 9.9   LOS: 13 days A FACE TO FACE EVALUATION WAS PERFORMED  Keondra Haydu 03/24/2020, 8:43 AM

## 2020-03-24 NOTE — Progress Notes (Addendum)
Patient ID: [name], male   DOB: 1961-01-02, 59 y.o.   MRN: [id number]   SW met with pt to inform on potential discharge by end of week pending medically ready per attending concerns about potassium levels.   SW spoke left message for Nikki/Admissions with [facility] [phone]) to discuss bed offer. SW waiting on follow-up.  *SW received follow-up that there will not be any beds available for another 14 days.   SW met with pt in room to inform on above changes, and discussed other bed offers. Pt would like [facility]. SW spoke with Juliann Pulse Campbell/liasion with [facility] to discuss bed offer. SW informed potential ready for d/c tomorrow or Friday. SW will request COVID test pending date pt will be ready.   Loralee Pacas, MSW, [place] Office: [phone] Cell: [phone] Fax: [phone]

## 2020-03-24 NOTE — Progress Notes (Signed)
Physical Therapy Session Note  Patient Details  Name: [name] MRN: [id number] Date of Birth: 1960-11-24  Today's Date: 03/24/2020 PT Individual Time: 0900-0950; 1630-1700 PT Individual Time Calculation (min): 50 min and 30 min PT Missed Time: 10 min Missed Time Reason: patient fatigue  Short Term Goals: Week 2:  PT Short Term Goal 1 (Week 2): pt to demonstrate dynamic sitting balance for functional tasks at min A PT Short Term Goal 2 (Week 2): pt to demonstrate min A x1 functional transfers with LRAD consistently PT Short Term Goal 3 (Week 2): pt to demonstate min A x1 bed mobility consistently PT Short Term Goal 4 (Week 2): pt to demonstrate gait training with LRAD for 75' min A  Skilled Therapeutic Interventions/Progress Updates:    Session 1: Pt received seated in w/c in room, agreeable to PT session. No complaints of pain. Sit to stand with Supervision to CGA to RW throughout session. Ambulation x 100 ft with RW and CGA for balance. Pt exhibits improved LE clearance and decreased shuffling of gait this date. Session focus on LE clearance during gait and safe RW management. Ambulation over obstacles on floor with RW and CGA with cues for LE clearance. Navigation through obstacle course weaving through cones with RW and CGA for balance, cues for increased LE clearance. Pt reports feeling significantly fatigued and declines any further participation in therapy session. Stand pivot transfer back to bed with RW and CGA. Sit to supine Supervision. Pt left seated in bed with needs in reach, bed alarm in place. Pt missed 10 min of scheduled therapy session due to fatigue.   Session 2: Pt received seated in bed, agreeable with encouragement to participate in therapy session. No complaints of pain at rest, does have onset of upper back pain with mobility. Nursing notified and able to provide pt with pain medication at end of session. Bed mobility with min A. Sit to stand with CGA to RW. Stand  pivot transfer with RW and CGA. Session focus on static standing balance with no UE support performing ball toss against rebounder. Pt requires CGA to min A for dynamic standing balance with no UE support and cues for safety. Pt agreeable to remain seated in w/c at end of session, needs in reach and quick release belt and chair alarm in place.  Therapy Documentation Precautions:  Precautions Precautions: Cervical Precaution Booklet Issued: No Required Braces or Orthoses: Cervical Brace Cervical Brace: Soft collar (when up OOB) Restrictions Weight Bearing Restrictions: No    Therapy/Group: Individual Therapy   Excell Seltzer, PT, DPT  03/24/2020, 11:28 AM

## 2020-03-24 NOTE — Discharge Summary (Signed)
Physician Discharge Summary  Patient ID: FITZGERALD DUNNE MRN: [id number] DOB/AGE: 11-27-1960 59 y.o.  Admit date: 03/11/2020 Discharge date: 03/26/2020  Discharge Diagnoses:  Principal Problem:   Cervical myelopathy ([place]) Active Problems:   Hyperlipidemia associated with type 2 diabetes mellitus (HCC) DVT prophylaxis Pain management Hypertension CKD stage II with hyperkalemia Morbid obesity History of alcohol tobacco and cocaine use Diastolic congestive heart failure Hyperlipidemia Neurogenic bowel E. coli UTI Congenital hydrocephalus BPH  Discharged Condition: Stable  Significant Diagnostic Studies: CT ABDOMEN PELVIS WO CONTRAST  Result Date: 03/13/2020 CLINICAL DATA:  Nausea and vomiting, ileus EXAM: CT ABDOMEN AND PELVIS WITHOUT CONTRAST TECHNIQUE: Multidetector CT imaging of the abdomen and pelvis was performed following the standard protocol without IV contrast. COMPARISON:  03/13/2020 FINDINGS: Lower chest: Minimal hypoventilatory changes within the lower lobes. No acute pleural or parenchymal lung disease. Hepatobiliary: No focal liver abnormality is seen. No gallstones, gallbladder wall thickening, or biliary dilatation. Pancreas: Unremarkable. No pancreatic ductal dilatation or surrounding inflammatory changes. Spleen: Normal in size without focal abnormality. Adrenals/Urinary Tract: Adrenals are unremarkable. No urinary tract calculi or obstructive uropathy. Bladder is unremarkable. Stomach/Bowel: The colon is moderately distended with gas and stool. No bowel wall thickening or inflammatory change. Normal appendix right lower quadrant. No evidence of small-bowel obstruction. Vascular/Lymphatic: Aortic atherosclerosis. No enlarged abdominal or pelvic lymph nodes. Reproductive: Prostate is unremarkable. Likely undescended testicle within the distal aspect of the right inguinal canal, unchanged since prior exam. Please correlate with physical exam findings. Other: Trace free fluid  within the hepatorenal fossa. No free intraperitoneal gas. Musculoskeletal: No acute or destructive bony lesions. Reconstructed images demonstrate no additional findings. IMPRESSION: 1. Moderate colonic ileus. No evidence of small-bowel obstruction. 2. Trace free fluid within the hepatorenal fossa. 3. Stable undescended right testicle within the distal aspect of the right inguinal canal. 4. Aortic Atherosclerosis (ICD10-I70.0). Electronically Signed   By: Randa Ngo M.D.   On: 03/13/2020 22:47   DG Cervical Spine 2-3 Views  Result Date: 03/05/2020 CLINICAL DATA:  Surgery, elective. C3-6 posterior fusion. Fluoroscopy time 12 seconds, 3.45 mGy EXAM: CERVICAL SPINE - 2-3 VIEW; DG C-ARM 1-60 MIN COMPARISON:  MRI of the cervical spine 02/26/2020 FINDINGS: Three intraoperative fluoroscopic images of the cervical spine are submitted. A posterior spinal fusion construct is now present at the C3-C6 levels. Superimposition of the shoulders limits evaluation of the C4 and more caudal levels on the lateral view images. Additionally, overlying artifact partially obscures the lower aspect of the right-sided fusion hardware on the PA image. Within these limitations, there is no unexpected finding. Partially visualized support tubes. IMPRESSION: Three intraoperative fluoroscopic images of the cervical spine as above. Electronically Signed   By: Kellie Simmering DO   On: 03/05/2020 18:13   DG Abd 1 View  Result Date: 03/15/2020 CLINICAL DATA:  Follow-up ileus EXAM: ABDOMEN - 1 VIEW COMPARISON:  03/14/2020 FINDINGS: Since 03/13/2020 there is been of gradual improvement and colonic distension compatible with resolving ileus. Moderate stool identified within the rectum. IMPRESSION: Improving ileus pattern. Electronically Signed   By: Kerby Moors M.D.   On: 03/15/2020 08:15   DG Abd 1 View  Result Date: 03/14/2020 CLINICAL DATA:  Follow-up ileus. EXAM: ABDOMEN - 1 VIEW COMPARISON:  March 13, 2020 FINDINGS: The patient  has known colonic ileus remains, similar in the interval. The most marked dilatation is in the cecum. No other interval changes. IMPRESSION: Persistent colonic ileus. The most significant dilatation remains in the cecum. Electronically Signed   By:  Dorise Bullion III M.D   On: 03/14/2020 08:34   DG Abd 1 View  Result Date: 03/13/2020 CLINICAL DATA:  Evaluate for ileus. EXAM: ABDOMEN - 1 VIEW COMPARISON:  None. FINDINGS: Numerous dilated air-filled loops of bowel are seen throughout the abdomen and pelvis. This appears to represent predominantly large bowel, with dilated small bowel loops seen overlying the mid left abdomen. A mild-to-moderate amount of stool is seen within the distal sigmoid colon. No radio-opaque calculi or other significant radiographic abnormality are seen. IMPRESSION: 1. Findings consistent with ileus versus partial small bowel obstruction. Correlation with abdomen pelvis CT is recommended. Electronically Signed   By: Virgina Norfolk M.D.   On: 03/13/2020 18:07   MR Cervical Spine W or Wo Contrast  Result Date: 02/26/2020 CLINICAL DATA:  Bilateral arm and leg weakness. EXAM: MRI CERVICAL AND THORACIC SPINE WITHOUT AND WITH CONTRAST TECHNIQUE: Multiplanar and multiecho pulse sequences of the cervical spine, to include the craniocervical junction and cervicothoracic junction, and thoracic spine, were obtained without and with intravenous contrast. CONTRAST:  65mL GADAVIST GADOBUTROL 1 MMOL/ML IV SOLN COMPARISON:  MRI cervical spine with contrast 04/27/2015. CT cervical spine 05/09/2019. CT cervical spine 12/26/2013 FINDINGS: MRI CERVICAL SPINE FINDINGS Alignment: Normal alignment Vertebrae: Negative for fracture or mass. Discogenic changes in the bone marrow at C5-6, C6-7, C7-T1. Mild bone marrow edema at C5-6 which appears degenerative. Findings are in not suggestive of discitis. Cord: Cord hyperintensity bilaterally at C5-6, left greater than right. This was present in 2016. There is  cord compression at C3-4 and C4-5 due to circumferential narrowing of the canal. See below description. No other areas of cord signal abnormality. Posterior Fossa, vertebral arteries, paraspinal tissues: There is posterior ligamentous thickening and enhancement involving the ligamentum flavum at C3 and C4 left greater than right. In addition, there is low signal tissue within the ligamentum flavum on the left at C3 and C4 which does not enhance. This consistent with ligament calcification and was noted on the prior CT in 2020 but was not present in 2015 Disc levels: C2-3: Mild disc degeneration. Negative for spinal or foraminal stenosis C3-4: Disc degeneration with central and right-sided disc protrusion and associated spurring. Bilateral ligament thickening left greater than right which shows enhancement. There is moderate spinal stenosis with compression of the cord. There is mild to moderate foraminal stenosis bilaterally. C4-5: Disc degeneration with shallow central disc protrusion and diffuse uncinate spurring. Mild facet degeneration bilaterally. Moderate to severe spinal stenosis with compression of the cord left greater than right. Severe left foraminal encroachment and moderate right foraminal encroachment. Diffuse ligament thickening and enhancement. Calcification in the left ligamentum flavum is noted on prior CT. C5-6: Disc degeneration with diffuse uncinate facet degeneration. Spurring. Mild posterior ligament thickening and enhancement. Mild spinal stenosis. Cord hyperintensity bilaterally left greater than right. Moderate foraminal stenosis bilaterally C6-7: Disc degeneration with diffuse endplate spurring. Moderate foraminal encroachment bilaterally. C7-T1: Disc degeneration with edema in the endplates which show mild enhancement. There is diffuse endplate spurring and moderate foraminal stenosis bilaterally. MRI THORACIC SPINE FINDINGS Segmentation:  Normal Alignment:  Normal Vertebrae:  Negative  for fracture or mass Spinal cord: Normal signal and morphology. No cord compression. Mild vascular enhancement dorsal to the lower thoracic cord is most likely normal vessels. Paraspinal and other soft tissues: Negative for paraspinous mass or adenopathy. No paraspinous fluid collection. No pleural effusion. Disc levels: T1-2: Disc degeneration and spurring with foraminal narrowing bilaterally. T2-3: Mild spurring on the left without significant stenosis  T3-4: Disc degeneration and spurring on the right with mild right foraminal narrowing T4-5: Mild disc degeneration. Right-sided facet degeneration and mild right foraminal narrowing. T7-8: Mild disc degeneration and small to moderate left foraminal disc protrusion. T8-9: Disc and facet degeneration. Mild foraminal narrowing bilaterally T9-10: Disc degeneration and spurring. Bilateral facet hypertrophy. Right foraminal narrowing due to spurring. T10-11: Disc degeneration and facet degeneration. Foraminal narrowing bilaterally due to spurring. T11-12: Disc and facet degeneration. Mild foraminal narrowing bilaterally due to spurring. IMPRESSION: 1. Moderate spinal stenosis and cord compression at C3-4 and C4-5. This is due to a combination of disc degeneration and spurring as well as prominent ligamentum flavum hypertrophy bilaterally left greater right. The ligament show edema and enhancement C3 through C5. In addition, there is posterior ligament calcification at C3-4 and C4-5 contributing to stenosis. Infection is a consideration, white blood count slightly elevated. However more likely is a inflammatory process in the ligaments causing edema and enlargement. Chronic calcification ligament is noted on prior CT. 2. 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 This is relatively mild and unchanged from 2016 MRI. 3. Multilevel thoracic degenerative changes causing foraminal stenosis bilaterally. No cord compression. 4. These results were  called by telephone at the time of interpretation on 02/26/2020 at 4:03 pm to provider [facility] , who verbally acknowledged these results. Electronically Signed   By: Franchot Gallo M.D.   On: 02/26/2020 16:04   MR THORACIC SPINE W WO CONTRAST  Result Date: 02/26/2020 CLINICAL DATA:  Bilateral arm and leg weakness. EXAM: MRI CERVICAL AND THORACIC SPINE WITHOUT AND WITH CONTRAST TECHNIQUE: Multiplanar and multiecho pulse sequences of the cervical spine, to include the craniocervical junction and cervicothoracic junction, and thoracic spine, were obtained without and with intravenous contrast. CONTRAST:  43mL GADAVIST GADOBUTROL 1 MMOL/ML IV SOLN COMPARISON:  MRI cervical spine with contrast 04/27/2015. CT cervical spine 05/09/2019. CT cervical spine 12/26/2013 FINDINGS: MRI CERVICAL SPINE FINDINGS Alignment: Normal alignment Vertebrae: Negative for fracture or mass. Discogenic changes in the bone marrow at C5-6, C6-7, C7-T1. Mild bone marrow edema at C5-6 which appears degenerative. Findings are in not suggestive of discitis. Cord: Cord hyperintensity bilaterally at C5-6, left greater than right. This was present in 2016. There is cord compression at C3-4 and C4-5 due to circumferential narrowing of the canal. See below description. No other areas of cord signal abnormality. Posterior Fossa, vertebral arteries, paraspinal tissues: There is posterior ligamentous thickening and enhancement involving the ligamentum flavum at C3 and C4 left greater than right. In addition, there is low signal tissue within the ligamentum flavum on the left at C3 and C4 which does not enhance. This consistent with ligament calcification and was noted on the prior CT in 2020 but was not present in 2015 Disc levels: C2-3: Mild disc degeneration. Negative for spinal or foraminal stenosis C3-4: Disc degeneration with central and right-sided disc protrusion and associated spurring. Bilateral ligament thickening left greater than right which  shows enhancement. There is moderate spinal stenosis with compression of the cord. There is mild to moderate foraminal stenosis bilaterally. C4-5: Disc degeneration with shallow central disc protrusion and diffuse uncinate spurring. Mild facet degeneration bilaterally. Moderate to severe spinal stenosis with compression of the cord left greater than right. Severe left foraminal encroachment and moderate right foraminal encroachment. Diffuse ligament thickening and enhancement. Calcification in the left ligamentum flavum is noted on prior CT. C5-6: Disc degeneration with diffuse uncinate facet degeneration. Spurring. Mild posterior ligament thickening and enhancement. Mild  spinal stenosis. Cord hyperintensity bilaterally left greater than right. Moderate foraminal stenosis bilaterally C6-7: Disc degeneration with diffuse endplate spurring. Moderate foraminal encroachment bilaterally. C7-T1: Disc degeneration with edema in the endplates which show mild enhancement. There is diffuse endplate spurring and moderate foraminal stenosis bilaterally. MRI THORACIC SPINE FINDINGS Segmentation:  Normal Alignment:  Normal Vertebrae:  Negative for fracture or mass Spinal cord: Normal signal and morphology. No cord compression. Mild vascular enhancement dorsal to the lower thoracic cord is most likely normal vessels. Paraspinal and other soft tissues: Negative for paraspinous mass or adenopathy. No paraspinous fluid collection. No pleural effusion. Disc levels: T1-2: Disc degeneration and spurring with foraminal narrowing bilaterally. T2-3: Mild spurring on the left without significant stenosis T3-4: Disc degeneration and spurring on the right with mild right foraminal narrowing T4-5: Mild disc degeneration. Right-sided facet degeneration and mild right foraminal narrowing. T7-8: Mild disc degeneration and small to moderate left foraminal disc protrusion. T8-9: Disc and facet degeneration. Mild foraminal narrowing bilaterally  T9-10: Disc degeneration and spurring. Bilateral facet hypertrophy. Right foraminal narrowing due to spurring. T10-11: Disc degeneration and facet degeneration. Foraminal narrowing bilaterally due to spurring. T11-12: Disc and facet degeneration. Mild foraminal narrowing bilaterally due to spurring. IMPRESSION: 1. Moderate spinal stenosis and cord compression at C3-4 and C4-5. This is due to a combination of disc degeneration and spurring as well as prominent ligamentum flavum hypertrophy bilaterally left greater right. The ligament show edema and enhancement C3 through C5. In addition, there is posterior ligament calcification at C3-4 and C4-5 contributing to stenosis. Infection is a consideration, white blood count slightly elevated. However more likely is a inflammatory process in the ligaments causing edema and enlargement. Chronic calcification ligament is noted on prior CT. 2. 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 This is relatively mild and unchanged from 2016 MRI. 3. Multilevel thoracic degenerative changes causing foraminal stenosis bilaterally. No cord compression. 4. These results were called by telephone at the time of interpretation on 02/26/2020 at 4:03 pm to provider [facility] , who verbally acknowledged these results. Electronically Signed   By: Franchot Gallo M.D.   On: 02/26/2020 16:04   DG C-Arm 1-60 Min  Result Date: 03/05/2020 CLINICAL DATA:  Surgery, elective. C3-6 posterior fusion. Fluoroscopy time 12 seconds, 3.45 mGy EXAM: CERVICAL SPINE - 2-3 VIEW; DG C-ARM 1-60 MIN COMPARISON:  MRI of the cervical spine 02/26/2020 FINDINGS: Three intraoperative fluoroscopic images of the cervical spine are submitted. A posterior spinal fusion construct is now present at the C3-C6 levels. Superimposition of the shoulders limits evaluation of the C4 and more caudal levels on the lateral view images. Additionally, overlying artifact partially obscures the lower aspect of  the right-sided fusion hardware on the PA image. Within these limitations, there is no unexpected finding. Partially visualized support tubes. IMPRESSION: Three intraoperative fluoroscopic images of the cervical spine as above. Electronically Signed   By: Kellie Simmering DO   On: 03/05/2020 18:13   VAS [name] LOWER EXTREMITY VENOUS (DVT)  Result Date: 03/15/2020  Lower Venous DVTStudy Indications: Edema.  Risk Factors: Immobility Surgery 03-05-2020 Cervical spine. Comparison Study: Prior LLEV 12-19-2017 WNL Performing Technologist: Darlin Coco  Examination Guidelines: A complete evaluation includes B-mode imaging, spectral Doppler, color Doppler, and power Doppler as needed of all accessible portions of each vessel. Bilateral testing is considered an integral part of a complete examination. Limited examinations for reoccurring indications may be performed as noted. The reflux portion of the exam is performed with the  patient in reverse Trendelenburg.  +---------+---------------+---------+-----------+----------+--------------+ RIGHT    CompressibilityPhasicitySpontaneityPropertiesThrombus Aging +---------+---------------+---------+-----------+----------+--------------+ CFV      Full           Yes      Yes                                 +---------+---------------+---------+-----------+----------+--------------+ SFJ      Full                                                        +---------+---------------+---------+-----------+----------+--------------+ FV Prox  Full                                                        +---------+---------------+---------+-----------+----------+--------------+ FV Mid   Full                                                        +---------+---------------+---------+-----------+----------+--------------+ FV DistalFull                                                         +---------+---------------+---------+-----------+----------+--------------+ PFV      Full                                                        +---------+---------------+---------+-----------+----------+--------------+ POP      Full           Yes      Yes                                 +---------+---------------+---------+-----------+----------+--------------+ PTV      Full                                                        +---------+---------------+---------+-----------+----------+--------------+ PERO     Full                                                        +---------+---------------+---------+-----------+----------+--------------+   +---------+---------------+---------+-----------+----------+--------------+ LEFT     CompressibilityPhasicitySpontaneityPropertiesThrombus Aging +---------+---------------+---------+-----------+----------+--------------+ CFV      Full           Yes      Yes                                 +---------+---------------+---------+-----------+----------+--------------+  SFJ      Full                                                        +---------+---------------+---------+-----------+----------+--------------+ FV Prox  Full                                                        +---------+---------------+---------+-----------+----------+--------------+ FV Mid   Full                                                        +---------+---------------+---------+-----------+----------+--------------+ FV DistalFull                                                        +---------+---------------+---------+-----------+----------+--------------+ PFV      Full                                                        +---------+---------------+---------+-----------+----------+--------------+ POP      Full           Yes      Yes                                  +---------+---------------+---------+-----------+----------+--------------+ PTV      Full                                                        +---------+---------------+---------+-----------+----------+--------------+ PERO     Full                                                        +---------+---------------+---------+-----------+----------+--------------+     Summary: RIGHT: - There is no evidence of deep vein thrombosis in the lower extremity.  - No cystic structure found in the popliteal fossa.  LEFT: - There is no evidence of deep vein thrombosis in the lower extremity.  - No cystic structure found in the popliteal fossa.  *See table(s) above for measurements and observations. Electronically signed by Ruta Hinds MD on 03/15/2020 at 5:08:36 PM.    Final     Labs:  Basic Metabolic Panel: Recent Labs  Lab 03/22/20 0511 03/23/20 2725 03/24/20 0718 03/25/20 0623  NA 138 136 133* 135  K 5.9* 6.2* 5.5* 5.5*  CL  108 107 102 105  CO2 23 22 21* 23  GLUCOSE 152* 128* 185* 143*  BUN 27* 28* 23 23  CREATININE 1.11 1.18 1.11 0.95  CALCIUM 8.7* 8.8* 8.6* 8.8*    CBC: Recent Labs  Lab 03/22/20 0511 03/23/20 0632  WBC 12.6* 9.9  NEUTROABS 9.6* 7.1  HGB 10.7* 10.4*  HCT 34.2* 33.7*  MCV 87.0 86.4  PLT 264 261    CBG: Recent Labs  Lab 03/25/20 1138 03/25/20 1628 03/25/20 1654 03/25/20 2047 03/25/20 2333  GLUCAP 152* 49* 82 290* 174*   Family history.  Mother with hypertension diabetes and breast cancer.  Father with hypertension CAD and CVA.  Denies any colon cancer esophageal cancer or rectal cancer  Brief HPI:   Othman BRYCETON HANTZ is a 59 y.o. right-handed male with history of alcohol tobacco use prior cocaine use complicated by STEMI in the past, BPH, chronic pain of low back, CKD stage II, diastolic congestive heart failure, type 2 diabetes mellitus, hyperlipidemia, morbid obesity with BMI 30.09, congenital hydrocephalus, history of spinal cord contusion  myelopathy central cord syndrome at C5-6 after a fall with residual left-sided weakness 2015 seen by Dr. Ronnald Ramp with conservative care.  Per chart review lives alone 1 level apartment.  He has an aide 3 days a week 2 hours at a time he uses a walker and a cane.  Presented 02/26/2020 with progressive weakness gait instability lower extremities left greater than right as well as numbness and spasticity with stool incontinence.  Admission chemistries glucose 209 BUN 26 creatinine 1.46 hemoglobin 14.7 hemoglobin A1c 7.1 SARS coronavirus negative.  Recent CTs and brain MRIs demonstrated diffuse atrophy negative for acute infarct.  X-rays and imaging revealed severe spinal stenosis myelopathy at C3-4 4-5.  Patient underwent decompressive cervical laminotomy with medial facetectomy C3-4 4-5 with posterior cervical arthrodesis lateral mass fixation 03/05/2020 per Dr. Sherley Bounds.  Soft collar as directed.  Decadron protocol.  Patient was admitted for a comprehensive rehab program.   Hospital Course: Elye MAREO PORTILLA was admitted to rehab 03/11/2020 for inpatient therapies to consist of PT, ST and OT at least three hours five days a week. Past admission physiatrist, therapy team and rehab RN have worked together to provide customized collaborative inpatient rehab.  Pertaining to patient's cervical spinal cord compression status post cervical laminotomy posterior cervical arthrodesis.  Initial cervical collar patient was refusing.  Surgical site healing nicely he would follow-up with neurosurgery.  Subcutaneous Lovenox for DVT prophylaxis vascular studies negative plan for 3 months of DVT prophylaxis.  Pain managed with spasticity maintained on baclofen 5 mg 3 times daily, Cymbalta as well as oxycodone as needed.  Blood pressure overall controlled monitor for any orthostasis.  Blood sugars hemoglobin A1c 7.1 insulin therapy as directed full diabetic teaching.  Blood sugars monitored closely while on taper of Decadron.  CKD stage  II with latest creatinine 1.11.  Morbid obesity BMI 30.09 dietary follow-up.  Patient did have a history of alcohol tobacco cocaine use providing counts regards to cessation of these products.  Diastolic congestive heart failure exhibited no signs of fluid overload and he could resume his Lasix.  Patient did have bouts of hyperkalemia receiving Lokelma with latest potassium level 4.7 stabilized with Lasix being resumed.  Neurogenic bowel ileus diet slowly advanced tolerating well bowel program established now tolerating regular consistency diet.   Blood pressures were monitored on TID basis and controlled  Diabetes has been monitored with ac/hs CBG checks and SSI was use prn  for tighter BS control.    Rehab course: During patient's stay in rehab weekly team conferences were held to monitor patient's progress, set goals and discuss barriers to discharge. At admission, patient required minimal assist 25 feet [name] minimal assist stand pivot transfers moderate assist side-lying to sitting.  Minimal guard grooming minimal assist upper body bathing max is lower body bathing minimal assist upper body dressing max is lower body dressing   Physical exam.  Blood pressure 148/55 pulse 65 temperature 97.5 respirations 18 oxygen saturations 100% room air Constitutional.  No acute distress HEENT Head.  Normocephalic and atraumatic Eyes.  Pupils round and reactive to light no discharge.nystagmus Cardiac regular rate rhythm without any extra sounds or murmur heard Abdomen.  Soft nontender positive bowel sounds obese Respiratory effort normal no respiratory distress without wheeze Skin.  Surgical neck incision clean and dry Neurologic alert and oriented Decreased sensation to light touch L1-S2 bilateral No clonus Upper extremity biceps 5 -/5 wrist extension 4+/5 triceps 4 -/5 grip 4 -/5 Lower extremities hip flexion 4+/5 left hip flexion 4/5 otherwise 4+/5    He/  has had improvement in activity  tolerance, balance, postural control as well as ability to compensate for deficits. He/ has had improvement in functional use RUE/LUE  and RLE/LLE as well as improvement in awareness.  Patient directed in wheelchair mobility and manual wheelchair with bilateral upper extremities for forward propulsion with multiple turns improved ease of obstacles navigation.  Patient directed and 5 STS from wheelchair to rolling walker contact-guard assist.  Ambulation training 100 feet rolling walker min assist contact-guard.  Patient did require multiple rest breaks.  Due to limited assistance at home fell skilled nursing facility was new bed becoming available 03/25/2020.       Disposition: Discharge to skilled nursing facility    Diet: Carb modified  Special Instructions: No driving smoking or alcohol  Encourage patient to wear cervical collar when out of bed.  Follow-up labs every 3 to 5 days monitor potassium levels  Medications at discharge 1.  Tylenol as needed 2.  Lipitor 40 mg p.o. daily 3.  Baclofen 5 mg p.o. 3 times daily 4.  Dulcolax suppository nightly 5.  Decadron 1 mg every 8 hours x3 days then 1 mg twice daily x3 days and stop 6.  Cymbalta 60 mg p.o. daily 7.  Lovenox 40 mg daily until 06/05/2020 and stop 8.  Hydralazine 25 mg p.o. twice daily 9.  NovoLog 6 units 3 times daily with meals 10.  Lantus insulin 43 units daily 11.  Labetalol 50 mg p.o. twice daily 12.  Oxycodone 5 mg p.o. every 3 hours as needed pain 13.  Senokot 1 tablet p.o. twice daily 14.  Lasix.  40 mg daily.   30 to 35 minutes were spent completing discharge summary and discharge planning Discharge Instructions    AMB referral to rehabilitation   Complete by: As directed    Ambulatory referral to Physical Medicine Rehab   Complete by: As directed    Moderate complexity follow-up 1 month cervical myelopathy.  Patient is for skilled nursing facility placement       Follow-up Information    Lovorn, Jinny Blossom,  MD Follow up.   Specialty: Physical Medicine and Rehabilitation Why: Office to call for appointment Contact information: [address] [place] [address] [phone]        Eustace Moore, MD Follow up.   Specialty: Neurosurgery Why: Call for appointment Contact information: [address] [place] [address] [phone]               Signed: Lavon Paganini [place] 03/26/2020, 5:17 AM

## 2020-03-25 ENCOUNTER — Inpatient Hospital Stay (HOSPITAL_COMMUNITY): Payer: Medicare Other

## 2020-03-25 ENCOUNTER — Inpatient Hospital Stay (HOSPITAL_COMMUNITY): Payer: Medicare Other | Admitting: Physical Therapy

## 2020-03-25 LAB — GLUCOSE, CAPILLARY
Glucose-Capillary: 145 mg/dL — ABNORMAL HIGH (ref 70–99)
Glucose-Capillary: 152 mg/dL — ABNORMAL HIGH (ref 70–99)
Glucose-Capillary: 174 mg/dL — ABNORMAL HIGH (ref 70–99)
Glucose-Capillary: 290 mg/dL — ABNORMAL HIGH (ref 70–99)
Glucose-Capillary: 49 mg/dL — ABNORMAL LOW (ref 70–99)
Glucose-Capillary: 82 mg/dL (ref 70–99)

## 2020-03-25 LAB — BASIC METABOLIC PANEL
Anion gap: 7 (ref 5–15)
BUN: 23 mg/dL (ref 8–23)
CO2: 23 mmol/L (ref 22–32)
Calcium: 8.8 mg/dL — ABNORMAL LOW (ref 8.9–10.3)
Chloride: 105 mmol/L (ref 98–111)
Creatinine, Ser: 0.95 mg/dL (ref 0.61–1.24)
GFR calc Af Amer: >60 mL/min (ref >60)
GFR calc non Af Amer: >60 mL/min (ref >60)
Glucose, Bld: 143 mg/dL — ABNORMAL HIGH (ref 70–99)
Potassium: 5.5 mmol/L — ABNORMAL HIGH (ref 3.5–5.1)
Sodium: 135 mmol/L (ref 135–145)

## 2020-03-25 LAB — SARS CORONAVIRUS 2 (TAT 6-24 HRS): SARS Coronavirus 2: NEGATIVE

## 2020-03-25 MED ORDER — SODIUM ZIRCONIUM CYCLOSILICATE 10 G PO PACK: 10.0000 g | PACK | Freq: Three times a day (TID) | ORAL | Status: DC

## 2020-03-25 MED ORDER — SODIUM ZIRCONIUM CYCLOSILICATE 10 G PO PACK
10.0000 g | PACK | Freq: Three times a day (TID) | ORAL | Status: AC
  Administered 2020-03-25 (×2): 10 g via ORAL
  Filled 2020-03-25 (×2): qty 1

## 2020-03-25 MED ORDER — FUROSEMIDE 40 MG PO TABS
40.0000 mg | ORAL_TABLET | Freq: Every day | ORAL | Status: DC
  Administered 2020-03-25 – 2020-03-26 (×2): 40 mg via ORAL
  Filled 2020-03-25 (×2): qty 1

## 2020-03-25 MED ORDER — SODIUM POLYSTYRENE SULFONATE 15 GM/60ML PO SUSP
45.0000 g | Freq: Once | ORAL | Status: AC
  Administered 2020-03-25: 45 g via ORAL
  Filled 2020-03-25: qty 180

## 2020-03-25 NOTE — Progress Notes (Signed)
Patient ID: [name], male   DOB: 16-May-1961, 59 y.o.   MRN: [id number]  Per attending, pt may be ready for discharge tomorrow pending medical clearance. SW spoke with Juliann Pulse Campbell/liasion for [facility] [phone]) and able to receive pt tomorrow. SW informed will follow-up if this changes. [facility] ambulance scheduled for Friday (9/3) at 10am.   SW met with pt in room to inform on above.   Loralee Pacas, MSW, [place] Office: [phone] Cell: [phone] Fax: [phone]

## 2020-03-25 NOTE — Progress Notes (Signed)
[facility] PHYSICAL MEDICINE & REHABILITATION PROGRESS NOTE   Subjective/Complaints:   Pt's BGs better, but his K+ still 5.5- hasn't improved- will give Lokelma x3 AND Kayexylate 45 G x1 to bring it down- hopefully, will work well.    ROS:   Pt denies SOB, abd pain, CP, N/V/C/D, and vision changes    Objective:   No results found. Recent Labs    03/23/20 0632  WBC 9.9  HGB 10.4*  HCT 33.7*  PLT 261   Recent Labs    03/24/20 0718 03/25/20 0623  NA 133* 135  K 5.5* 5.5*  CL 102 105  CO2 21* 23  GLUCOSE 185* 143*  BUN 23 23  CREATININE 1.11 0.95  CALCIUM 8.6* 8.8*    Intake/Output Summary (Last 24 hours) at 03/25/2020 0839 Last data filed at 03/25/2020 0740 Gross per 24 hour  Intake 684 ml  Output 250 ml  Net 434 ml     Physical Exam: Vital Signs Blood pressure 134/62, pulse 61, temperature 98.1 F (36.7 C), resp. rate 17, height 6\' 3"  (1.905 m), weight 99.3 kg, SpO2 100 %. General: pt on mat in gym; with OT, appropriate, NAD HEENT: conjugate gaze Heart: RRR Chest: CTA B/L- no W/R/R- good air movement Abdomen: Soft, NT, ND, (+)BS   - protuberant Extremities: No clubbing, cyanosis, or edema. Pulses are 2+ Psych:appropriate Neuro:  Comments: UEs- biceps 5-/5, WE 4+/5, triceps 4-/5, grip 4-/5, finger abd 3+/5 B/L LEs- R HF 4+/5; L HF 4/5- otherwise 4+/5 in KE, DF, PF and EHL B/L- L Ox3 Decreased sensation to light touch in L1 to S2 B/L- otherwise intact in UEs and torso B/L MAS of 1 in LUE and LEs- MAS of 0 in RUE--no changes Very mild Spasticity/clawing appearing of L>R hands- also notable is intrinsic muscle atrophy- L>R hands-less noticeable this AM Skin:staples out- steristrips in place- a little tiny opening in middle of incision; otherwise, looks great- no erythema    Assessment/Plan: 1. 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  Physiatrist is providing close team supervision and 24 hour management of active medical problems listed below.  Physiatrist and rehab team continue to assess barriers to discharge/monitor patient progress toward functional and medical goals  Care Tool:  Bathing    Body parts bathed by patient: Right arm, Left arm, Chest, Abdomen, Front perineal area, Face, Right upper leg, Left upper leg, Right lower leg, Buttocks, Left lower leg   Body parts bathed by helper: Left lower leg, Buttocks     Bathing assist Assist Level: Contact Guard/Touching assist     Upper Body Dressing/Undressing Upper body dressing   What is the patient wearing?: Pull over shirt    Upper body assist Assist Level: Supervision/Verbal cueing    Lower Body Dressing/Undressing Lower body dressing      What is the patient wearing?: Pants, Incontinence brief     Lower body assist Assist for lower body dressing: Contact Guard/Touching assist     Toileting Toileting Toileting Activity did not occur (Clothing management and hygiene only): Refused  Toileting assist Assist for toileting: Contact Guard/Touching assist     Transfers Chair/bed transfer  Transfers assist     Chair/bed transfer assist level: Contact Guard/Touching assist     Locomotion Ambulation   Ambulation assist      Assist level: Contact Guard/Touching assist Assistive  device: Walker-rolling Max distance: 100'   Walk 10 feet activity   Assist  Walk 10 feet activity did not occur: Safety/medical concerns  Assist level: Contact Guard/Touching assist Assistive device: Walker-rolling   Walk 50 feet activity   Assist Walk 50 feet with 2 turns activity did not occur: Safety/medical concerns  Assist level: Contact Guard/Touching assist Assistive device: Walker-rolling    Walk 150 feet activity   Assist Walk 150 feet activity did not occur:  Safety/medical concerns         Walk 10 feet on uneven surface  activity   Assist Walk 10 feet on uneven surfaces activity did not occur: Safety/medical concerns   Assist level: Minimal Assistance - Patient > 75% Assistive device: Aeronautical engineer Will patient use wheelchair at discharge?: Yes Type of Wheelchair: Manual    Wheelchair assist level: Supervision/Verbal cueing Max wheelchair distance: 50'    Wheelchair 50 feet with 2 turns activity    Assist        Assist Level: Supervision/Verbal cueing   Wheelchair 150 feet activity     Assist      Assist Level: Total Assistance - Patient < 25%   Blood pressure 134/62, pulse 61, temperature 98.1 F (36.7 C), resp. rate 17, height 6\' 3"  (1.905 m), weight 99.3 kg, SpO2 100 %.  Medical Problem List and Plan: 1.Cervical myelopathysecondary to cervical spinal cord compression status post cervical laminectomy and medialfacetectomyC3-4 4-5 posterior cervical arthrodesis C3-C6. Cervical collar at all times? 8/20- pt was given soft collar- refuses to wear it -patient may Shower with covering incision -ELOS/Goals: min A to Mod I, 3 weeks  8/30- looking for SNF- will needs labs at SNF due to continuing to be be high K+- also, will get [employer] out of neck today- is due  -Continue CIR 2. Antithrombotics: -DVT/anticoagulation: .  vascular study ok  8/21 lovenox   8/26- will need for 3 months total, since not walking -antiplatelet therapy: N/A 3. Pain Management:Neurontin 300 mg twice daily,baclofen 5 mg 3 times daily,Cymbalta 60 mg daily, oxycodone as needed   8/22 holding scheduled gabapentin and baclofen given ileus  8/23- will restart Baclofen 5 mg TID since having bad muscle spasms of hands L>R- explained to pt.   8/25- pain doing better/spasms better  8/26- haven't restart gabapentin- pt hasn't asked- so far.   8/28: uses oxycodone  sparingly (once today). Wean to 5mg  dose.   8/29: has used oxycodone once today.  9/2- c/o icepick in head/neck- explained needs to take pain meds to treat- he agreed  4. Mood:Provide emotional support -antipsychotic agents: N/A 5. Neuropsych: This patientiscapable of making decisions on hisown behalf. 6. Skin/Wound Care:Routine skin checks 7. Fluids/Electrolytes/Nutrition:Routine in and outs with follow-up chemistries 8. Hypertension. Hydralazine 25 mg twice daily, labetalol 100 mg twice daily, lisinopril 40 mg daily. Monitor with increased mobility  8/26- will decrease Labetalol to 50 mg BID since pulse is low  8/27- Pulse slightly better- 58 this AM- will monitor  8/28-8/29: stable  8/31- Pulse 63.  9/1- held lisinopril BP better  9. Diabetes mellitus with peripheral neuropathy. Hemoglobin A1c 7.1. NovoLog 12units 3 times daily with meals, Lantus insulin 55units daily. Check blood sugars before meals and at bedtime. Diabetic teaching CBG (last 3)  Recent Labs    03/24/20 1708 03/24/20 2107 03/25/20 0617  GLUCAP 172* 203* 145*    8/31- BGs much improved 110s-150s- con't regimen  9/1- decadron is weaning, so BGs went  low last night- Reduced Lantus from 46 to 43 units.  9/2- BGs slightly elevated, but no hypoglycemic events  10. CKD stage II. Creatinine baseline 1.46. Follow-up chemistries  8/20- Baseline is 1.4-   8/22 Cr 1 today, holding lasix for now  -kayexalate for hyperkalemia  8/25- recheck labs in AM  8/26- Labs not back yet- will monitor  8/27- Cr 1.27- which is around baseline; K+ 5.1- will give some Lokelma again.   8/30- K+ 5.9- even after Lokelma on Friday- will speak with pharmacy might need to schedule low dose- will give another dose today  9/1- K+ 5.5 after 2 doses of Lokelma- another dose this AM- wil right for more- to bring down into better range  9/2- Cr down to 0.95- K+ stable at 5.5- will give Lokelma 10 G TID AND Kayexylate 45G  to get his K+ down.  11. Morbid obesity. BMI 30.09. Dietary follow-up 12. History of alcohol tobacco cocaine use. Providing counseling 13. Diastolic congestive heart failure. Lasix 40 mg daily. Monitor with increased mobility   Filed Weights   03/23/20 0346 03/24/20 0500 03/25/20 0500  Weight: 98.7 kg 100 kg 99.3 kg    8/22 weights down  8/23- weight up 1 kg- in spite of pooping.  8/27- Weight 98.7 kg- stable  8/30- Weight up slightly 99.7- on 8/29- however not more than original weight.   9/1- Weight elevated somewhat 100kg- but still in stable range.   9/2- weight stable 14. Hyperlipidemia. Lipitor 15. Neurogenic bowel/ileus and bladder- goes every 1 week - which is likely the slowing of neurogenic bowel - need to improve speed; also has urinary urgency, which is also neurogenic- will monitor and treat as required.   8/20- gave Sorbitol to get pt to have BM today- has been 7-8 days- usually goes 1x/week  8/22 colonic ileus on xray and CT. No SBO  8/23- still has moderate stool burden- will give another dose Sorbitol and then change back to regular diet if poops. Also change reglan to PO 10 mg TID-AC x 2 days- since lost IV access.   8/24- didn't have another BM- will give 1/2 bottle Mg citrate at noon and by dinner, change diet back to regular DM diet.   8/25- 2 large BMs yesterday- put back to regular/DM diet-  8/27- will place on bowel program, OK with pt- will add lidocaine jelly as well   8/31- large BM last night  9/2- large BM last night 16. Spasticity- on Baclofen 5mg  TID- adequately controlled- mainly annoying, not painful- will monitor muscle spasms. 17. Hyperkalemia  8/20- Not on any K+ replacement- K+ is 5.1-max level of normal- will monitor closely.    8/22 kayexalate today  8/30- gave Lokelma 10 G today- and will recheck labs in AM  8/31- K+ up to 6.2 even though got Lokelma 10 G x1 yesterday- will hold Lisinopril and give 3 doses (BID) of Lokelma and recheck  in AM  9/2- called pharmacy- nothing that could be causing K+ to be elevated. Gave Lokelma TID AND Kayexylate.  18. Leukocytosis-   8/20- on Decadron slow taper- WBC 20k-   will check U/A and Cx just in case- afebrile- but will monitor for sx's of infection.    8/21-22 ua +, ucx pending--> await results   -suspect ileus playing a role too  8/23- U/A (+) somewhat- U Cx growing >100k E Coli- WBC down to 16.6k  8/24- UTI- EColi- will start Bactrim for UTI- based on Cx's- Bactrim  DS BID x 7 days.  8/25- no complaints of Sx's.   8/27- WBC down to 13.3k  8/30- WBC down to 12.6- on decadron taper   9/1- WBC 9.9   LOS: 14 days A FACE TO FACE EVALUATION WAS PERFORMED  Liviah Cake 03/25/2020, 8:39 AM

## 2020-03-25 NOTE — Progress Notes (Signed)
Hypoglycemic Event  CBG: 49  Treatment: 8 oz juice/soda  Symptoms: None  Follow-up CBG: OQUC:7519 CBG Result:89  Possible Reasons for Event: Other: Patient had two loose stools before taking blood glucose  Comments/MD notified:Patient is stable    Mildred Bollard L Rieley Khalsa

## 2020-03-25 NOTE — Progress Notes (Signed)
Patient bowel program started at 1845 with dig stim. Nothing in chamber. Suppository introduced at 1900. Patient started to have bowel movement at 1930.

## 2020-03-25 NOTE — Progress Notes (Signed)
Patient dig stemmed x2 rectal vault clear. Bowel program complete.

## 2020-03-25 NOTE — Progress Notes (Signed)
Physical Therapy Discharge Summary  Patient Details  Name: [name] MRN: [id number] Date of Birth: Apr 23, 1961  Today's Date: 03/25/2020  Patient has met 5 of 8 long term goals due to improved activity tolerance, improved balance, improved postural control and increased strength.  Patient to discharge at a wheelchair level Modified Independent. And min to cga w/gait.  Patient's care partner unavailable to provide the necessary physical assistance at discharge. And pt to be dc to SNF.  Reasons goals not met: Pt had limited cardiovascular endurance with functional mobility.  Recommendation:  Patient will benefit from ongoing skilled PT services in skilled nursing facility setting to continue to advance safe functional mobility, address ongoing impairments in endurance, strength, balance, and minimize fall risk.  Equipment: No equipment provided  Reasons for discharge: transfer to SNF  Patient/family agrees with progress made and goals achieved: Yes  PT Discharge Precautions/Restrictions Precautions Precautions: Cervical Precaution Booklet Issued: No Required Braces or Orthoses: Cervical Brace Cervical Brace: Soft collar Restrictions Weight Bearing Restrictions: No    Pain Pain Assessment Pain Scale: 0-10 Pain Score: 7  Pain Type: Surgical pain Pain Location: Neck Pain Orientation: Left;Posterior Pain Descriptors / Indicators: Throbbing Vision/Perception  Perception Perception: Within Functional Limits Praxis Praxis: Intact  Cognition Overall Cognitive Status: No family/caregiver present to determine baseline cognitive functioning Arousal/Alertness: Awake/alert Orientation Level: Oriented X4 Awareness: Appears intact Problem Solving: Appears intact Safety/Judgment: Appears intact Sensation Sensation Light Touch: Impaired by gross assessment (pt reports B foot numbness on the bottom of his feet and toes) Proprioception: Appears Intact Coordination Gross Motor  Movements are Fluid and Coordinated: Yes Fine Motor Movements are Fluid and Coordinated: No Motor  Motor Motor: Within Functional Limits  Mobility Bed Mobility Bed Mobility: Sitting - Scoot to Edge of Bed;Sit to Supine;Rolling Right;Rolling Left Rolling Right: Supervision/verbal cueing Rolling Left: Supervision/Verbal cueing Supine to Sit: Supervision/Verbal cueing Sitting - Scoot to Edge of Bed: Supervision/Verbal cueing Sit to Supine: Supervision/Verbal cueing Scooting to HOB: Supervision/Verbal Cueing Transfers Transfers: Sit to Stand;Stand Pivot Transfers Sit to Stand: Contact Guard/Touching assist Stand Pivot Transfers: Contact Guard/Touching assist Stand Pivot Transfer Details: Verbal cues for technique;Verbal cues for sequencing;Verbal cues for precautions/safety Transfer (Assistive device): Rolling walker Locomotion    Pt ambulates >15f w/RW and cga, 611fhousehold/functional gait w/RW w/cga wc propulsion max distance is 7541fue to limited endurance, performs mod I.  [name]  [name], 12:42 PM

## 2020-03-25 NOTE — Progress Notes (Signed)
This nurse completed bowel program with round of digital stimulation. No stool expelled. Patient had continent bowel movement on toilet at 19:28 with nurse tech prior to this nurse performing digital stimulation.

## 2020-03-25 NOTE — Plan of Care (Signed)
°  Problem: Consults Goal: RH GENERAL PATIENT EDUCATION Description: See Patient Education module for education specifics. Outcome: Progressing Goal: Skin Care Protocol Initiated - if Braden Score 18 or less Description: If consults are not indicated, leave blank or document N/A Outcome: Progressing Goal: Nutrition Consult-if indicated Outcome: Progressing Goal: Diabetes Guidelines if Diabetic/Glucose > 140 Description: If diabetic or lab glucose is > 140 mg/dl - Initiate Diabetes/Hyperglycemia Guidelines & Document Interventions  Outcome: Progressing   Problem: RH BOWEL ELIMINATION Goal: RH STG MANAGE BOWEL WITH ASSISTANCE Description: STG Manage Bowel with mod Assistance. Outcome: Progressing Goal: RH STG MANAGE BOWEL W/MEDICATION W/ASSISTANCE Description: STG Manage Bowel with Medication with mod Assistance. Outcome: Progressing   Problem: RH BLADDER ELIMINATION Goal: RH STG MANAGE BLADDER WITH ASSISTANCE Description: STG Manage Bladder With mod Assistance Outcome: Progressing   Problem: RH SKIN INTEGRITY Goal: RH STG SKIN FREE OF INFECTION/BREAKDOWN Outcome: Progressing Goal: RH STG MAINTAIN SKIN INTEGRITY WITH ASSISTANCE Description: STG Maintain Skin Integrity With mod Assistance. Outcome: Progressing Goal: RH STG ABLE TO PERFORM INCISION/WOUND CARE W/ASSISTANCE Description: STG Able To Perform Incision/Wound Care With max Assistance. Outcome: Progressing   Problem: RH SAFETY Goal: RH STG ADHERE TO SAFETY PRECAUTIONS W/ASSISTANCE/DEVICE Description: STG Adhere to Safety Precautions With min Assistance/Device. Outcome: Progressing Goal: RH STG DECREASED RISK OF FALL WITH ASSISTANCE Description: STG Decreased Risk of Fall With min Assistance. Outcome: Progressing   Problem: RH PAIN MANAGEMENT Goal: RH STG PAIN MANAGED AT OR BELOW PT'S PAIN GOAL Description: Less than 3 out of 10  Outcome: Progressing   Problem: RH KNOWLEDGE DEFICIT GENERAL Goal: RH STG INCREASE  KNOWLEDGE OF SELF CARE AFTER HOSPITALIZATION Description: Pt will be able to verbalize self care while at home with min assist   Outcome: Progressing

## 2020-03-25 NOTE — Progress Notes (Signed)
Occupational Therapy Discharge Summary  Patient Details  Name: [name] MRN: [id number] Date of Birth: 1961/01/01  Patient has met 7 of 11 long term goals due to improved activity tolerance, improved balance, postural control, ability to compensate for deficits and improved coordination. Pt made steady but slow progress with BADLs and functional transfers. Pt requires CGA/min A for LB bathing/dressing tasks and functional transfers. Pt requires min verbal cues for safety awareness and cervical precautions/wearing of soft collar.  Pt discharging to SNF for further rehab. Patient to discharge at [facility] Assist level.  Patient's care partner cannot provide the necessary assistance at discharge.   Recommendation:  Patient will benefit from ongoing skilled OT services in skilled nursing facility setting to continue to advance functional skills in the area of BADL.  Equipment: No equipment provided SNF  Reasons for discharge: treatment goals met and discharge from hospital  Patient/family agrees with progress made and goals achieved: Yes  OT Discharge Vision Baseline Vision/History: Wears glasses Wears Glasses: At all times Patient Visual Report: No change from baseline Vision Assessment?: No apparent visual deficits Perception  Perception: Within Functional Limits Praxis Praxis: Intact Cognition Overall Cognitive Status: No family/caregiver present to determine baseline cognitive functioning Arousal/Alertness: Awake/alert Orientation Level: Oriented X4 Attention: Focused;Sustained Focused Attention: Appears intact Sustained Attention: Appears intact Memory: Appears intact Problem Solving: Appears intact Safety/Judgment: Appears intact Sensation Sensation Light Touch: Impaired by gross assessment Hot/Cold: Appears Intact Proprioception: Appears Intact Stereognosis: Not tested Coordination Gross Motor Movements are Fluid and Coordinated: Yes Fine Motor Movements are  Fluid and Coordinated: No 9 Hole Peg Test: R-1'5" L-1'36" Motor  Motor Motor: Within Functional Limits     Trunk/Postural Assessment  Cervical Assessment Cervical Assessment:  (cervical collar) Thoracic Assessment Thoracic Assessment:  (rounded shoulders) Lumbar Assessment Lumbar Assessment: Exceptions to [facility]  Balance Static Sitting Balance Static Sitting - Balance Support: Feet supported Static Sitting - Level of Assistance: 5: Stand by assistance Dynamic Sitting Balance Dynamic Sitting - Balance Support: During functional activity Dynamic Sitting - Level of Assistance: 5: Stand by assistance Extremity/Trunk Assessment RUE Assessment RUE Assessment: Exceptions to [facility] Passive Range of Motion (PROM) Comments: PROM: WFL Active Range of Motion (AROM) Comments: Limited 2/2 cervical precautions. Patient able to actively flex shoulder to 90degrees. Elbow, wrist, and digits WFL. General Strength Comments: MMT grossly 3+/5 LUE Assessment Passive Range of Motion (PROM) Comments: PROM: WFL Active Range of Motion (AROM) Comments: Limited 2/2 cervical precautions. Patient able to actively flex shoulder to 90degrees. Elbow, wrist, and digits WFL. General Strength Comments: MMT grossly 3+/5   Leroy Libman 03/25/2020, 2:56 PM

## 2020-03-25 NOTE — Progress Notes (Signed)
Physical Therapy Session Note  Patient Details  Name: [name] MRN: [id number] Date of Birth: Jan 15, 1961  Today's Date: 03/25/2020 PT Individual Time: 0915-1000 PT Individual Time Calculation (min): 45 min   Short Term Goals: Week 1:  PT Short Term Goal 1 (Week 1): pt to demonstate min A x1 bed mobility consistently PT Short Term Goal 1 - Progress (Week 1): Progressing toward goal PT Short Term Goal 2 (Week 1): pt to demonstrate min A x1 functional transfers with LRAD consistently PT Short Term Goal 2 - Progress (Week 1): Progressing toward goal PT Short Term Goal 3 (Week 1): pt to demonstrate 40' with LRAD mod A x1 PT Short Term Goal 3 - Progress (Week 1): Progressing toward goal PT Short Term Goal 4 (Week 1): pt to demonstrate dynamic sitting balance for functional tasks at min A PT Short Term Goal 4 - Progress (Week 1): Progressing toward goal Week 2:  PT Short Term Goal 1 (Week 2): pt to demonstrate dynamic sitting balance for functional tasks at min A PT Short Term Goal 2 (Week 2): pt to demonstrate min A x1 functional transfers with LRAD consistently PT Short Term Goal 3 (Week 2): pt to demonstate min A x1 bed mobility consistently PT Short Term Goal 4 (Week 2): pt to demonstrate gait training with LRAD for 75' min A    Skilled Therapeutic Interventions/Progress Updates:    pt received in bed and agreeable to therapy, nursing present. Pt directed in supine<>sitx2 without use of rails supervision. Pt directed in sitting EOB balance to don socks, slight min A to place socks on foot then pt able to complete and maintain balance. Pt directed in STS from EOB to RW and gait training 17' +75' with RW at [facility] with VC for increased step height and stride length. Pt directed in [place] mobility with BUE for propulsion for 100' supervision-I and pt requires assist for taking on and off BLE and VC for WC breaks. Pt directed in x5 STS from [facility] at supervision, ambulation up and down ramp in gym with  RW at [facility] for safety and WC mobility for additional 83' as pt reported post his therapy sessions this am and reported he was fatigued. Pt taken rest of way back to room in [facility] at total A and directed in returning to supine in bed at [facility] with RW and for bed mobility no bedrails used. Pt left in bed, alarm set, All needs in reach and in good condition. Call light in hand.     Therapy Documentation Precautions:  Precautions Precautions: Cervical Precaution Booklet Issued: No Required Braces or Orthoses: Cervical Brace Cervical Brace: Soft collar Restrictions Weight Bearing Restrictions: No   Pain: Pain Assessment Pain Scale: 0-10 Pain Score: 7  Pain Type: Surgical pain Pain Location: Neck Pain Orientation: Left;Posterior Pain Descriptors / Indicators: Throbbing Mobility: Bed Mobility Bed Mobility: Sitting - Scoot to Edge of Bed;Sit to Supine;Rolling Right;Rolling Left Rolling Right: Supervision/verbal cueing Rolling Left: Supervision/Verbal cueing Supine to Sit: Supervision/Verbal cueing Sitting - Scoot to Edge of Bed: Supervision/Verbal cueing Sit to Supine: Supervision/Verbal cueing Scooting to [facility]: Supervision/Verbal Cueing Transfers Transfers: Sit to Stand;Stand Pivot Transfers Sit to Stand: Contact Guard/Touching assist Stand Pivot Transfers: Contact Guard/Touching assist Stand Pivot Transfer Details: Verbal cues for technique;Verbal cues for sequencing;Verbal cues for precautions/safety Transfer (Assistive device): Rolling walker   Therapy/Group: Individual Therapy  [name] 03/25/2020, 12:23 PM

## 2020-03-25 NOTE — Progress Notes (Signed)
Physical Therapy Session Note  Patient Details  Name: [name] MRN: [id number] Date of Birth: Jun 28, 1961  Today's Date: 03/25/2020 PT Individual Time: 0831-0859 PT Individual Time Calculation (min): 28 min   Short Term Goals: Week 2:  PT Short Term Goal 1 (Week 2): pt to demonstrate dynamic sitting balance for functional tasks at min A PT Short Term Goal 2 (Week 2): pt to demonstrate min A x1 functional transfers with LRAD consistently PT Short Term Goal 3 (Week 2): pt to demonstate min A x1 bed mobility consistently PT Short Term Goal 4 (Week 2): pt to demonstrate gait training with LRAD for 75' min A  Skilled Therapeutic Interventions/Progress Updates: Pt presents sitting in w/c and agreeable to therapy.  Pt wheeled to gym for energy and time conservation.  Pt transfers sit to stand w/ supervision multiple trials.  Pt requires CGA for transfer into car, but mod A and increased verbal cues for initiation and hand placement for standing from car.  Pt amb multiple times w/ RW and CGA, short distance in gym (approx. 15') to longer distances of 100'.  Pt w/ improved step length/decreased shuffling, but increased shuffling w/ turns and approaching surface.  Pt requested to lie down in bed post-rx.  Pt transfers sit to supine w/ supervision.  Bed alarm on and all needs in reach.     Therapy Documentation Precautions:  Precautions Precautions: Cervical Precaution Booklet Issued: No Required Braces or Orthoses: Cervical Brace Cervical Brace: Soft collar (when up OOB) Restrictions Weight Bearing Restrictions: No General:   Vital Signs:   Pain:7/10 neck, nursing to administer meds post-rx.      Therapy/Group: Individual Therapy  [name] 03/25/2020, 8:59 AM

## 2020-03-25 NOTE — Progress Notes (Signed)
Occupational Therapy Session Note  Patient Details  Name: [name] MRN: [id number] Date of Birth: Apr 25, 1961  Today's Date: 03/25/2020 OT Individual Time: 0700-0756 OT Individual Time Calculation (min): 56 min    Short Term Goals: Week 2:  OT Short Term Goal 1 (Week 2): Pt will be able to consistently complete sit to stands with min A or less to increase independence with toileting. OT Short Term Goal 2 (Week 2): Pt will be able to manage clothing over hips with min A. OT Short Term Goal 3 (Week 2): Pt will be able to don pants over feet with min A using reacher. OT Short Term Goal 4 (Week 2): Pt will be able to sit to EOB with S.  Skilled Therapeutic Interventions/Progress Updates:    OT intervention with focus on bed mobility, BADL retraining, sit<>stand, standing balance, functional amb with RW, activity tolerance, and safety awareness to increase independence with BADLs. Supine>sit EOB with supervision.  Sit<>stand with CGA. Pt amb with RW to bathroom to use toilet before returning to room to change clothing. Pt completred dressing tasks with CGA when standing to pull pants over hips. Pt engaged in standing activities at table with focus on standing balance and endurance. Pt returned to room and remained seated in w/c with all needs within reach and belt alarm activated.   Therapy Documentation Precautions:  Precautions Precautions: Cervical Precaution Booklet Issued: No Required Braces or Orthoses: Cervical Brace Cervical Brace: Soft collar (when up OOB) Restrictions Weight Bearing Restrictions: No Pain:  Pt denies pain this morning   Therapy/Group: Individual Therapy  Leroy Libman 03/25/2020, 7:58 AM

## 2020-03-25 NOTE — Progress Notes (Signed)
Physical Therapy Session Note  Patient Details  Name: VOYD GROFT MRN: [id number] Date of Birth: 1960/09/12  Today's Date: 03/25/2020 PT Individual Time: 1050-1135 PT Individual Time Calculation (min): 45 min   Short Term Goals: Week 1:  PT Short Term Goal 1 (Week 1): pt to demonstate min A x1 bed mobility consistently PT Short Term Goal 1 - Progress (Week 1): Progressing toward goal PT Short Term Goal 2 (Week 1): pt to demonstrate min A x1 functional transfers with LRAD consistently PT Short Term Goal 2 - Progress (Week 1): Progressing toward goal PT Short Term Goal 3 (Week 1): pt to demonstrate 79' with LRAD mod A x1 PT Short Term Goal 3 - Progress (Week 1): Progressing toward goal PT Short Term Goal 4 (Week 1): pt to demonstrate dynamic sitting balance for functional tasks at min A PT Short Term Goal 4 - Progress (Week 1): Progressing toward goal Week 2:  PT Short Term Goal 1 (Week 2): pt to demonstrate dynamic sitting balance for functional tasks at min A PT Short Term Goal 2 (Week 2): pt to demonstrate min A x1 functional transfers with LRAD consistently PT Short Term Goal 3 (Week 2): pt to demonstate min A x1 bed mobility consistently PT Short Term Goal 4 (Week 2): pt to demonstrate gait training with LRAD for 75' min A Week 3:     Skilled Therapeutic Interventions/Progress Updates:    PAIN Pt denies pain this am. Pt initially supine and agreeable to session.    Bed mobility: Rolls L/R w/supervision, no rails Supine to side to sit w/min assist Sit to supine w/supervision, no rails  STS - cga w/RW Gait: 186ft on level surface w/RW and cga, shuffles when turning, mild tremors bilat LEs w/fatigue, decreased cadence. Functional Gait: Gait 5ft weaving around cones including full turn at half way point w/RW and cga, increased LE tremors w/fatigue, shuffling gait w/turns, decreased cadence.  wc propulsion: 29ft w/bilat UEs slow cadence straight path mod I but slow  cadence. 75ft weaving around cones mod I but slow cadence  Pt declined further rx stating he was "worn out and dont have anymore energy".  Pt transported to hallway outside room.  STS from wc w/cga and gait x 38ft to bed, turn/sit to bed w/cga.  Sit to supine w/supervision. Pt left supine w/rails up x 4, alarm set, bed in lowest position, and needs in reach.  Therapy Documentation Precautions:  Precautions Precautions: Cervical Precaution Booklet Issued: No Required Braces or Orthoses: Cervical Brace Cervical Brace: Soft collar Restrictions Weight Bearing Restrictions: No    Therapy/Group: Individual Therapy  Callie Fielding, PT   Jerrilyn Cairo 03/25/2020, 11:54 AM

## 2020-03-26 LAB — GLUCOSE, CAPILLARY
Glucose-Capillary: 103 mg/dL — ABNORMAL HIGH (ref 70–99)
Glucose-Capillary: 118 mg/dL — ABNORMAL HIGH (ref 70–99)

## 2020-03-26 LAB — BASIC METABOLIC PANEL
Anion gap: 10 (ref 5–15)
BUN: 19 mg/dL (ref 8–23)
CO2: 24 mmol/L (ref 22–32)
Calcium: 8.7 mg/dL — ABNORMAL LOW (ref 8.9–10.3)
Chloride: 103 mmol/L (ref 98–111)
Creatinine, Ser: 1.05 mg/dL (ref 0.61–1.24)
GFR calc Af Amer: >60 mL/min (ref >60)
GFR calc non Af Amer: >60 mL/min (ref >60)
Glucose, Bld: 180 mg/dL — ABNORMAL HIGH (ref 70–99)
Potassium: 4.7 mmol/L (ref 3.5–5.1)
Sodium: 137 mmol/L (ref 135–145)

## 2020-03-26 MED ORDER — ATORVASTATIN CALCIUM 40 MG PO TABS: 40.0000 mg | ORAL_TABLET | Freq: Every day | ORAL | Status: AC

## 2020-03-26 NOTE — Progress Notes (Signed)
Inpatient Rehabilitation Care Coordinator  Discharge Note  The overall goal for the admission was met for:   Discharge location: Yes. D/c to SNF- [facility] Rm#[id number]; Nurse report [phone].  Length of Stay: Yes. 14 days.  Discharge activity level: Yes. Min A to Max A at times due to fatigue.  Home/community participation: Yes. Limited.   Services provided included: MD, RD, PT, OT, RN, CM, TR, Pharmacy, Neuropsych and SW  Financial Services: Medicare  Follow-up services arranged: N/A   Comments (or additional information):contact pt friend Enid Derry [phone]; pt unable to find his phone since admission here.   Patient/Family verbalized understanding of follow-up arrangements: Yes  Individual responsible for coordination of the follow-up plan: Pt to have assistance with coordinating care needs.   Confirmed correct DME delivered: Rana Snare 03/26/2020    Rana Snare

## 2020-03-26 NOTE — Progress Notes (Addendum)
Patient ID: [name], male   DOB: 20-Nov-1960, 59 y.o.   MRN: [id number]  Per attending, pt needs to have transport time pushed back as waiting on lab results (I.e. potassium). SW informed pt assigned Therapist, sports. SW changed PTAR ambulance pick up to 1pm. SW informed Juliann Pulse Campbell/liasion with [facility] on changes and will follow-up if pt is not ready for discharge.  *Pt to discharge today. Medical team aware. D/c packet given to assigned RN. D/c to [facility] Rm#[id number]; Nurse report [phone].   Loralee Pacas, MSW, [place] Office: [phone] Cell: [phone] Fax: [phone]

## 2020-03-26 NOTE — Progress Notes (Addendum)
Writer responded to bedside following pt c/o bilateral facial numbness. On arrival, patient is alert, oriented and engaged in clear speech dialogue with staff member.  Patient also eating graham crackers.  Demonstrated ability to answer questions accurately and appropriately.  No dysarthria, ataxia or visual disturbances noted.  Equal bilateral grips and motor strength.  Patient denies any lingering symptoms at this time.  No further needs or concerns apparent at the time of this writing.  Nursing will continue to monitor.  Adrienna Karis Montey Hora, MSN, RN, CNL IP Rehab

## 2020-03-26 NOTE — Discharge Instructions (Signed)
Inpatient Rehab Discharge Instructions  Adorian NIXXON FARIA Discharge date and time: No discharge date for patient encounter.   Activities/Precautions/ Functional Status: Activity: activity as tolerated Diet: diabetic diet Wound Care: keep wound clean and dry Functional status:  ___ No restrictions     ___ Walk up steps independently ___ 24/7 supervision/assistance   ___ Walk up steps with assistance ___ Intermittent supervision/assistance  ___ Bathe/dress independently ___ Walk with walker     _x__ Bathe/dress with assistance ___ Walk Independently    ___ Shower independently ___ Walk with assistance    ___ Shower with assistance ___ No alcohol     ___ Return to work/school ________  Special Instructions: No driving smoking or alcohol   My questions have been answered and I understand these instructions. I will adhere to these goals and the provided educational materials after my discharge from the hospital.  Patient/Caregiver Signature _______________________________ Date __________  Clinician Signature _______________________________________ Date __________  Please bring this form and your medication list with you to all your follow-up doctor's appointments.    ==================================

## 2020-03-26 NOTE — Progress Notes (Signed)
Received call from [facility] reporting [name] reporting numbness in his neck. Dr. Dagoberto Ligas note was reviewed. Placed call to pharmacy, Gabapentin was placed on hold on 03/14/2020 given ileus, pr Dr. Naaman Plummer note. Discussed the above with charge nurse Lysle Morales RN, she will assess [name], we will continue to monitor. Instructed to leave a note for Dr Dagoberto Ligas regarding resuming his Gabapentin. We will continue to monitor.

## 2020-03-26 NOTE — Progress Notes (Signed)
Patient complained of numbness in face and neck. This nurse asked if pain was new, patient said yes. Patient also noted to have stutter, which patient claimed he "has had before but it's not acted up this bad." Vital signs WNL, no c/o SOB or chest pain. Patient requested that this nurse call provider. On-call provider notified. Zella Ball, NP consulted pharmacy about gabapentin and requested that this nurse leave note for Dr. Dagoberto Ligas to consider resuming. Charge nurse Lysle Morales was notified, as well as Dionne, RN who has helped care for patient before. Both nurses came and assisted with assessment. Upon second assessment with Lysle Morales and Dionne, patient backtracked and said numbness was not new and that stuttering had "calmed down." Patient alert and oriented x4, able to make all facial movements without difficulty. No acute distress noted. On-call provider updated. No new orders. Will continue to monitor.

## 2020-03-26 NOTE — Progress Notes (Signed)
[place] PHYSICAL MEDICINE & REHABILITATION PROGRESS NOTE   Subjective/Complaints:   Pt's K+ 4.7- doing better.    Had episode last night of "stuttering"- feeling better this AM.     ROS:   Pt denies SOB, abd pain, CP, N/V/C/D, and vision changes    Objective:   No results found. No results for input(s): WBC, HGB, HCT, PLT in the last 72 hours. Recent Labs    03/25/20 0623 03/26/20 0849  NA 135 137  K 5.5* 4.7  CL 105 103  CO2 23 24  GLUCOSE 143* 180*  BUN 23 19  CREATININE 0.95 1.05  CALCIUM 8.8* 8.7*    Intake/Output Summary (Last 24 hours) at 03/26/2020 1122 Last data filed at 03/26/2020 0749 Gross per 24 hour  Intake 840 ml  Output 475 ml  Net 365 ml     Physical Exam: Vital Signs Blood pressure (!) 140/58, pulse 61, temperature 97.6 F (36.4 C), temperature source Oral, resp. rate 18, height 6\' 3"  (1.905 m), weight 98.7 kg, SpO2 100 %. General: pt in bed; asleep- woke easily, appropriate, NAD- no stuttering. HEENT: conjugate gaze Heart: RRR Chest: CTA B/L- no W/R/R- good air movement Abdomen: Soft, NT, ND, (+)BS    - protuberant Extremities: No clubbing, cyanosis, or edema. Pulses are 2+ Psych:appropriate today Neuro:  Comments: UEs- biceps 5-/5, WE 4+/5, triceps 4-/5, grip 4-/5, finger abd 3+/5 B/L LEs- R HF 4+/5; L HF 4/5- otherwise 4+/5 in KE, DF, PF and EHL B/L- L Ox3 Decreased sensation to light touch in L1 to S2 B/L- otherwise intact in UEs and torso B/L MAS of 1 in LUE and LEs- MAS of 0 in RUE--no changes Very mild Spasticity/clawing appearing of L>R hands- also notable is intrinsic muscle atrophy- L>R hands-less noticeable this AM Skin:staples out- steristrips in place- a little tiny opening in middle of incision; otherwise, looks great- no erythema    Assessment/Plan: 1. 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  Physiatrist is providing close team supervision and 24 hour management of active medical problems listed below.  Physiatrist and rehab team continue to assess barriers to discharge/monitor patient progress toward functional and medical goals  Care Tool:  Bathing    Body parts bathed by patient: Right arm, Left arm, Chest, Abdomen, Front perineal area, Face, Right upper leg, Left upper leg, Right lower leg, Buttocks, Left lower leg   Body parts bathed by helper: Left lower leg, Buttocks     Bathing assist Assist Level: Contact Guard/Touching assist     Upper Body Dressing/Undressing Upper body dressing   What is the patient wearing?: Pull over shirt    Upper body assist Assist Level: Supervision/Verbal cueing    Lower Body Dressing/Undressing Lower body dressing      What is the patient wearing?: Pants, Incontinence brief     Lower body assist Assist for lower body dressing: Contact Guard/Touching assist     Toileting Toileting Toileting Activity did not occur (Clothing management and hygiene only): Refused  Toileting assist Assist for toileting: Contact Guard/Touching assist     Transfers Chair/bed transfer  Transfers assist     Chair/bed transfer assist level: Contact Guard/Touching assist     Locomotion Ambulation   Ambulation assist      Assist level: Contact Guard/Touching assist Assistive device: Walker-rolling Max distance: 150   Walk 10  feet activity   Assist  Walk 10 feet activity did not occur: Safety/medical concerns  Assist level: Contact Guard/Touching assist Assistive device: Walker-rolling   Walk 50 feet activity   Assist Walk 50 feet with 2 turns activity did not occur: Safety/medical concerns  Assist level: Contact Guard/Touching assist Assistive device: Walker-rolling    Walk 150 feet activity   Assist Walk 150 feet activity did not occur:  Safety/medical concerns  Assist level: Contact Guard/Touching assist Assistive device: Walker-rolling    Walk 10 feet on uneven surface  activity   Assist Walk 10 feet on uneven surfaces activity did not occur: Safety/medical concerns   Assist level: Minimal Assistance - Patient > 75% Assistive device: Aeronautical engineer Will patient use wheelchair at discharge?: Yes Type of Wheelchair: Manual    Wheelchair assist level: Independent Max wheelchair distance: 100    Wheelchair 50 feet with 2 turns activity    Assist        Assist Level: Independent   Wheelchair 150 feet activity     Assist  Wheelchair 150 feet activity did not occur: Safety/medical concerns   Assist Level: Moderate Assistance - Patient 50 - 74%   Blood pressure (!) 140/58, pulse 61, temperature 97.6 F (36.4 C), temperature source Oral, resp. rate 18, height 6\' 3"  (1.905 m), weight 98.7 kg, SpO2 100 %.  Medical Problem List and Plan: 1.Cervical myelopathysecondary to cervical spinal cord compression status post cervical laminectomy and medialfacetectomyC3-4 4-5 posterior cervical arthrodesis C3-C6. Cervical collar at all times? 8/20- pt was given soft collar- refuses to wear it -patient may Shower with covering incision -ELOS/Goals: min A to Mod I, 3 weeks  8/30- looking for SNF- will needs labs at SNF due to continuing to be be high K+- also, will get Staples out of neck today- is due  9/3- staples out- a little area in middle is SLIGHTLY open, but looks good- is healing  -Continue CIR 2. Antithrombotics: -DVT/anticoagulation: .  vascular study ok  8/21 lovenox   8/26- will need for 3 months total, since not walking -antiplatelet therapy: N/A 3. Pain Management:Neurontin 300 mg twice daily,baclofen 5 mg 3 times daily,Cymbalta 60 mg daily, oxycodone as needed   8/22 holding scheduled gabapentin and baclofen given  ileus  8/23- will restart Baclofen 5 mg TID since having bad muscle spasms of hands L>R- explained to pt.   8/25- pain doing better/spasms better  8/26- haven't restart gabapentin- pt hasn't asked- so far.   8/28: uses oxycodone sparingly (once today). Wean to 5mg  dose.   8/29: has used oxycodone once today.  9/2- c/o icepick in head/neck- explained needs to take pain meds to treat- he agreed  4. Mood:Provide emotional support -antipsychotic agents: N/A 5. Neuropsych: This patientiscapable of making decisions on hisown behalf. 6. Skin/Wound Care:Routine skin checks 7. Fluids/Electrolytes/Nutrition:Routine in and outs with follow-up chemistries 8. Hypertension. Hydralazine 25 mg twice daily, labetalol 100 mg twice daily, lisinopril 40 mg daily. Monitor with increased mobility  8/26- will decrease Labetalol to 50 mg BID since pulse is low  8/27- Pulse slightly better- 58 this AM- will monitor  8/28-8/29: stable  8/31- Pulse 63.  9/1- held lisinopril BP better  9/3- BP slightly high- 140/85- but with K+ issues, will not restart Lisinopril.   9. Diabetes mellitus with peripheral neuropathy. Hemoglobin A1c 7.1. NovoLog 12units 3 times daily with meals, Lantus insulin 55units daily. Check blood sugars before meals and at bedtime. Diabetic teaching CBG (  last 3)  Recent Labs    03/25/20 2047 03/25/20 2333 03/26/20 0632  GLUCAP 290* 174* 103*    8/31- BGs much improved 110s-150s- con't regimen  9/1- decadron is weaning, so BGs went low last night- Reduced Lantus from 46 to 43 units.  9/2- BGs slightly elevated, but no hypoglycemic events   9/3- A little elevated- said had snack- con't regimen 10. CKD stage II. Creatinine baseline 1.46. Follow-up chemistries  8/20- Baseline is 1.4-   8/22 Cr 1 today, holding lasix for now  -kayexalate for hyperkalemia  8/25- recheck labs in AM  8/26- Labs not back yet- will monitor  8/27- Cr 1.27- which is around baseline;  K+ 5.1- will give some Lokelma again.   8/30- K+ 5.9- even after Lokelma on Friday- will speak with pharmacy might need to schedule low dose- will give another dose today  9/1- K+ 5.5 after 2 doses of Lokelma- another dose this AM- wil right for more- to bring down into better range  9/2- Cr down to 0.95- K+ stable at 5.5- will give Lokelma 10 G TID AND Kayexylate 45G to get his K+ down.   9/3- K+ 4.7- con't Lasix and off Lisinopril.  11. Morbid obesity. BMI 30.09. Dietary follow-up 12. History of alcohol tobacco cocaine use. Providing counseling 13. Diastolic congestive heart failure. Lasix 40 mg daily. Monitor with increased mobility   Filed Weights   03/24/20 0500 03/25/20 0500 03/26/20 0305  Weight: 100 kg 99.3 kg 98.7 kg    8/22 weights down  8/23- weight up 1 kg- in spite of pooping.  8/27- Weight 98.7 kg- stable  8/30- Weight up slightly 99.7- on 8/29- however not more than original weight.   9/1- Weight elevated somewhat 100kg- but still in stable range.   9/2- weight stable 14. Hyperlipidemia. Lipitor 15. Neurogenic bowel/ileus and bladder- goes every 1 week - which is likely the slowing of neurogenic bowel - need to improve speed; also has urinary urgency, which is also neurogenic- will monitor and treat as required.   8/20- gave Sorbitol to get pt to have BM today- has been 7-8 days- usually goes 1x/week  8/22 colonic ileus on xray and CT. No SBO  8/23- still has moderate stool burden- will give another dose Sorbitol and then change back to regular diet if poops. Also change reglan to PO 10 mg TID-AC x 2 days- since lost IV access.   8/24- didn't have another BM- will give 1/2 bottle Mg citrate at noon and by dinner, change diet back to regular DM diet.   8/25- 2 large BMs yesterday- put back to regular/DM diet-  8/27- will place on bowel program, OK with pt- will add lidocaine jelly as well   8/31- large BM last night  9/2- large BM last night 16. Spasticity- on  Baclofen 5mg  TID- adequately controlled- mainly annoying, not painful- will monitor muscle spasms. 17. Hyperkalemia  8/20- Not on any K+ replacement- K+ is 5.1-max level of normal- will monitor closely.    8/22 kayexalate today  8/30- gave Lokelma 10 G today- and will recheck labs in AM  8/31- K+ up to 6.2 even though got Lokelma 10 G x1 yesterday- will hold Lisinopril and give 3 doses (BID) of Lokelma and recheck in AM  9/2- called pharmacy- nothing that could be causing K+ to be elevated. Gave Lokelma TID AND Kayexylate.  18. Leukocytosis-   8/20- on Decadron slow taper- WBC 20k-   will check U/A and Cx  just in case- afebrile- but will monitor for sx's of infection.    8/21-22 ua +, ucx pending--> await results   -suspect ileus playing a role too  8/23- U/A (+) somewhat- U Cx growing >100k E Coli- WBC down to 16.6k  8/24- UTI- EColi- will start Bactrim for UTI- based on Cx's- Bactrim DS BID x 7 days.  8/25- no complaints of Sx's.   8/27- WBC down to 13.3k  8/30- WBC down to 12.6- on decadron taper   9/1- WBC 9.9   LOS: 15 days A FACE TO FACE EVALUATION WAS PERFORMED  Cythia Bachtel 03/26/2020, 11:22 AM

## 2020-03-26 NOTE — Progress Notes (Signed)
Patient's nurse came to this nurse about patient c/o numbness to his neck & face. When assessed, patient was able to make all facial movements without difficulty. He was smiling & laughing with other nurse who was also asked to visualize patient due to familiarity. He was noted eating . Vital signs are WNL. No acute distress noted. Monitoring to continue. On call provider was informed

## 2020-03-26 NOTE — Progress Notes (Signed)
Patient discharges to SNF at [facility]. Patient left by PTAR via stretcher with two attendees.

## 2020-03-30 ENCOUNTER — Telehealth: Payer: Self-pay | Admitting: *Deleted

## 2020-03-30 ENCOUNTER — Encounter: Payer: Self-pay | Admitting: Neurology

## 2020-03-30 ENCOUNTER — Ambulatory Visit: Payer: Medicare Other | Admitting: Neurology

## 2020-03-30 NOTE — Telephone Encounter (Signed)
No showed new patient appointment. 

## 2020-04-01 DIAGNOSIS — I10 Essential (primary) hypertension: Secondary | ICD-10-CM | POA: Diagnosis not present

## 2020-04-01 DIAGNOSIS — E1169 Type 2 diabetes mellitus with other specified complication: Secondary | ICD-10-CM | POA: Diagnosis not present

## 2020-04-01 DIAGNOSIS — G959 Disease of spinal cord, unspecified: Secondary | ICD-10-CM | POA: Diagnosis not present

## 2020-04-12 DIAGNOSIS — I5032 Chronic diastolic (congestive) heart failure: Secondary | ICD-10-CM | POA: Diagnosis not present

## 2020-04-12 DIAGNOSIS — R5381 Other malaise: Secondary | ICD-10-CM | POA: Diagnosis not present

## 2020-04-12 DIAGNOSIS — R509 Fever, unspecified: Secondary | ICD-10-CM | POA: Diagnosis not present

## 2020-04-12 DIAGNOSIS — N182 Chronic kidney disease, stage 2 (mild): Secondary | ICD-10-CM | POA: Diagnosis not present

## 2020-04-12 DIAGNOSIS — U071 COVID-19: Secondary | ICD-10-CM | POA: Diagnosis not present

## 2020-04-12 DIAGNOSIS — E1165 Type 2 diabetes mellitus with hyperglycemia: Secondary | ICD-10-CM | POA: Diagnosis not present

## 2020-04-12 DIAGNOSIS — R5383 Other fatigue: Secondary | ICD-10-CM | POA: Diagnosis not present

## 2020-04-15 ENCOUNTER — Encounter (INDEPENDENT_AMBULATORY_CARE_PROVIDER_SITE_OTHER): Payer: Medicare Other | Admitting: Ophthalmology

## 2020-04-20 DIAGNOSIS — R509 Fever, unspecified: Secondary | ICD-10-CM | POA: Diagnosis not present

## 2020-04-20 DIAGNOSIS — R5383 Other fatigue: Secondary | ICD-10-CM | POA: Diagnosis not present

## 2020-04-20 DIAGNOSIS — U071 COVID-19: Secondary | ICD-10-CM | POA: Diagnosis not present

## 2020-04-20 DIAGNOSIS — Z7901 Long term (current) use of anticoagulants: Secondary | ICD-10-CM | POA: Diagnosis not present

## 2020-04-20 DIAGNOSIS — R5381 Other malaise: Secondary | ICD-10-CM | POA: Diagnosis not present

## 2020-04-22 ENCOUNTER — Encounter (INDEPENDENT_AMBULATORY_CARE_PROVIDER_SITE_OTHER): Payer: Medicare Other | Admitting: Ophthalmology

## 2020-04-23 ENCOUNTER — Emergency Department (HOSPITAL_COMMUNITY)
Admission: EM | Admit: 2020-04-23 | Discharge: 2020-04-23 | Disposition: A | Discharge status: Home / Self Care | Payer: Medicare Other | Attending: Emergency Medicine | Admitting: Emergency Medicine

## 2020-04-23 ENCOUNTER — Other Ambulatory Visit: Payer: Self-pay

## 2020-04-23 ENCOUNTER — Emergency Department (HOSPITAL_COMMUNITY): Payer: Medicare Other

## 2020-04-23 DIAGNOSIS — I129 Hypertensive chronic kidney disease with stage 1 through stage 4 chronic kidney disease, or unspecified chronic kidney disease: Secondary | ICD-10-CM | POA: Diagnosis not present

## 2020-04-23 DIAGNOSIS — E0840 Diabetes mellitus due to underlying condition with diabetic neuropathy, unspecified: Secondary | ICD-10-CM | POA: Diagnosis not present

## 2020-04-23 DIAGNOSIS — R410 Disorientation, unspecified: Secondary | ICD-10-CM | POA: Diagnosis not present

## 2020-04-23 DIAGNOSIS — R52 Pain, unspecified: Secondary | ICD-10-CM | POA: Diagnosis not present

## 2020-04-23 DIAGNOSIS — Z87891 Personal history of nicotine dependence: Secondary | ICD-10-CM | POA: Diagnosis not present

## 2020-04-23 DIAGNOSIS — I5032 Chronic diastolic (congestive) heart failure: Secondary | ICD-10-CM | POA: Diagnosis not present

## 2020-04-23 DIAGNOSIS — R519 Headache, unspecified: Secondary | ICD-10-CM | POA: Diagnosis not present

## 2020-04-23 DIAGNOSIS — E119 Type 2 diabetes mellitus without complications: Secondary | ICD-10-CM | POA: Diagnosis not present

## 2020-04-23 DIAGNOSIS — W19XXXA Unspecified fall, initial encounter: Secondary | ICD-10-CM | POA: Diagnosis not present

## 2020-04-23 DIAGNOSIS — U071 COVID-19: Secondary | ICD-10-CM | POA: Diagnosis not present

## 2020-04-23 DIAGNOSIS — S0990XA Unspecified injury of head, initial encounter: Secondary | ICD-10-CM | POA: Diagnosis not present

## 2020-04-23 DIAGNOSIS — M2550 Pain in unspecified joint: Secondary | ICD-10-CM | POA: Diagnosis not present

## 2020-04-23 DIAGNOSIS — I2510 Atherosclerotic heart disease of native coronary artery without angina pectoris: Secondary | ICD-10-CM | POA: Diagnosis not present

## 2020-04-23 DIAGNOSIS — R5381 Other malaise: Secondary | ICD-10-CM | POA: Diagnosis not present

## 2020-04-23 DIAGNOSIS — M6281 Muscle weakness (generalized): Secondary | ICD-10-CM | POA: Diagnosis not present

## 2020-04-23 DIAGNOSIS — Y92838 Other recreation area as the place of occurrence of the external cause: Secondary | ICD-10-CM | POA: Insufficient documentation

## 2020-04-23 DIAGNOSIS — E1122 Type 2 diabetes mellitus with diabetic chronic kidney disease: Secondary | ICD-10-CM | POA: Diagnosis not present

## 2020-04-23 DIAGNOSIS — I10 Essential (primary) hypertension: Secondary | ICD-10-CM | POA: Diagnosis not present

## 2020-04-23 DIAGNOSIS — Z7901 Long term (current) use of anticoagulants: Secondary | ICD-10-CM | POA: Diagnosis not present

## 2020-04-23 DIAGNOSIS — N182 Chronic kidney disease, stage 2 (mild): Secondary | ICD-10-CM | POA: Diagnosis not present

## 2020-04-23 DIAGNOSIS — Z8616 Personal history of COVID-19: Secondary | ICD-10-CM | POA: Diagnosis not present

## 2020-04-23 DIAGNOSIS — G959 Disease of spinal cord, unspecified: Secondary | ICD-10-CM | POA: Diagnosis not present

## 2020-04-23 DIAGNOSIS — H538 Other visual disturbances: Secondary | ICD-10-CM | POA: Diagnosis not present

## 2020-04-23 DIAGNOSIS — E113299 Type 2 diabetes mellitus with mild nonproliferative diabetic retinopathy without macular edema, unspecified eye: Secondary | ICD-10-CM | POA: Diagnosis not present

## 2020-04-23 DIAGNOSIS — Z043 Encounter for examination and observation following other accident: Secondary | ICD-10-CM | POA: Diagnosis not present

## 2020-04-23 DIAGNOSIS — Z7401 Bed confinement status: Secondary | ICD-10-CM | POA: Diagnosis not present

## 2020-04-23 DIAGNOSIS — E785 Hyperlipidemia, unspecified: Secondary | ICD-10-CM | POA: Diagnosis not present

## 2020-04-23 DIAGNOSIS — M542 Cervicalgia: Secondary | ICD-10-CM | POA: Diagnosis not present

## 2020-04-23 DIAGNOSIS — M4802 Spinal stenosis, cervical region: Secondary | ICD-10-CM | POA: Diagnosis not present

## 2020-04-23 DIAGNOSIS — R5383 Other fatigue: Secondary | ICD-10-CM | POA: Diagnosis not present

## 2020-04-23 DIAGNOSIS — I5030 Unspecified diastolic (congestive) heart failure: Secondary | ICD-10-CM | POA: Diagnosis not present

## 2020-04-23 DIAGNOSIS — E1169 Type 2 diabetes mellitus with other specified complication: Secondary | ICD-10-CM | POA: Diagnosis not present

## 2020-04-23 DIAGNOSIS — G629 Polyneuropathy, unspecified: Secondary | ICD-10-CM | POA: Diagnosis not present

## 2020-04-23 DIAGNOSIS — E1142 Type 2 diabetes mellitus with diabetic polyneuropathy: Secondary | ICD-10-CM | POA: Diagnosis not present

## 2020-04-23 NOTE — ED Notes (Signed)
Patient transported to CT 

## 2020-04-23 NOTE — ED Triage Notes (Signed)
Pt brought to ED via EMS from [facility] rehab s/p neck sx. Pt fell approx 1.5 hours ago trying to move from bed to wheelchair and hit back of head on ground. Per EMS, no LOC or bleeding; hematoma present. Pt reports tingling in right hand and blurred vision. Pt positive for COVID x 2wks ago. Pt alert and oriented x4 on arrival to ED, c-collar present, VSS, NAD.  EMS v/s: 148/77 18 RR 75 HR 98.5 temp 100% on room air

## 2020-04-23 NOTE — ED Provider Notes (Signed)
[facility] Emergency Department Provider Note MRN:  [id number]  Arrival date & time: 04/23/20     Chief Complaint   Fall   History of Present Illness   [name] is a 59 y.o. year-old male with a history of CKD, CAD, diabetes presenting to the ED with chief complaint of fall.  Patient explains that his wheelchair "gave out" and he rolled and fell backwards, striking the back of his head against the ground. Denies loss of consciousness, endorsing mild pain to the back of the head and the neck. Denies back pain, no chest pain or shortness of breath, no abdominal pain, no numbness or weakness to the arms or legs, does note some tingling or paresthesias to the right hand since the fall.  Review of Systems  A complete 10 system review of systems was obtained and all systems are negative except as noted in the HPI and PMH.   Patient's Health History    Past Medical History:  Diagnosis Date  . Alcohol abuse    11-15-2017  per pt last alcohol Dec 2018  . BPH with obstruction/lower urinary tract symptoms   . Chronic arm pain   . Chronic pain    arms, leg, back  . CKD (chronic kidney disease), stage II   . Cocaine abuse ([place])    11-15-2017  per pt last used March 2019  . Congenital hydrocephalus, unspecified (HCC)    slow progression  . Diabetic retinopathy of both eyes ([place])   . Diastolic CHF, chronic ([place]) 07/2017  . Gait instability    multifactorial -- slow worsening congenital hydrocephalus, peripheral neuropathy, left C5-6 cord lesion  . Hematuria   . History of acute pulmonary edema 07/2017  . History of spinal cord injury 01/02/2014   pt fell, caused spinal cord contusion at C5-6--  residual left side weakness    . 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 History of TIA (transient ischemic attack) and stroke    hx cva 12-20-2010 and  TIA 02-22-2011--- no residual's from cva or  tia  . Hyperlipidemia   . Hypertension    followed by pcp  . 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 Lesion of bladder   . Peripheral edema    chronic LUE  . Peripheral neuropathy   . Type 2 diabetes mellitus ([place])    followed by dr Karle Plumber--  last A1c 6.8 on 11-06-2017  . Weak urinary stream     Past Surgical History:  Procedure Laterality Date  . CARDIAC CATHETERIZATION  12/01/2008   dr hochrein   abnormal stress myoview:  mild coronary plaque, normal LVF  . CARDIAC CATHETERIZATION  04/29/2011   dr [place]   in setting ECG with new ST elevation & severe hypertensive:  nonobstructive atherosclerotic CAD, normal LVF (30% mRCA)   . CARDIOVASCULAR STRESS TEST  08/15/2010   Low nuclear study w/ no evidence ishemia/  ef 42% with lateral and apical hypokinesis  . CYSTOSCOPY WITH BIOPSY N/A 11/20/2017   Procedure: CYSTOSCOPY WITH BIOPSY AND FULGURATION;  Surgeon: [name], MD;  Location: [facility];  Service: Urology;  Laterality: N/A;  . INCISION AND DRAINAGE RIGHT DISTAL MEDIAL THIGH HEMATOMA  04-14-2005   dr Ninfa Linden  . left arm skin graft  1976   injury  . LEFT HEART CATHETERIZATION WITH CORONARY ANGIOGRAM Bilateral 12/26/2013   Procedure: LEFT HEART CATHETERIZATION WITH  CORONARY ANGIOGRAM;  Surgeon: Burnell Blanks, MD;  Location: [facility] CATH LAB;  Service: Cardiovascular;  Laterality: Bilateral;  STEMI, in setting cocaine/ alcohol :  mid disease in the RCA (20%), moderate disease in the small caliber intermediate branch (distal 50-60%), normal LVSF (ef 55-60%)  . POSTERIOR CERVICAL FUSION/FORAMINOTOMY N/A 03/05/2020   Procedure: CERVICAL THREE-FOUR, CERVICAL FOUR-FIVE, CERVICAL FIVE-SIX POSTERIOR CERVICAL FUSION/FORAMINOTOMY;  Surgeon: Eustace Moore, MD;  Location: [place];  Service: Neurosurgery;  Laterality: N/A;  . TOOTH EXTRACTION N/A 05/16/2018   Procedure: DENTAL RESTORATION/EXTRACTIONS WITH ALVEO;  Surgeon: Diona Browner,  DDS;  Location: [place];  Service: Oral Surgery;  Laterality: N/A;  . TRANSTHORACIC ECHOCARDIOGRAM  08/05/2017   moderate LVH,  ef 55%,  grade 1 diastolic dysfunction/  mild LAE/  trivial Tr    Family History  Problem Relation Age of Onset  . Hypertension Mother   . Diabetes Mother   . Cancer Mother        breast cancer   . Hypertension Father   . Heart disease Father   . Stroke Father   . Hypertension Sister     Social History   Socioeconomic History  . Marital status: Divorced    Spouse name: Not on file  . Number of children: 0  . Years of education: 10  . Highest education level: Not on file  Occupational History  . Occupation: Unemployed  Tobacco Use  . Smoking status: Former Smoker    Years: 45.00    Types: Cigarettes    Quit date: 03/03/2018    Years since quitting: 2.1  . Smokeless tobacco: Never Used  . Tobacco comment: 11-15-2017  per pt 1pp3days  Vaping Use  . Vaping Use: Never assessed  Substance and Sexual Activity  . Alcohol use: Not Currently    Alcohol/week: 0.0 standard drinks    Comment: 11-15-2017  per pt last alcohol 12/ 2018  alcohol abuse  . Drug use: Not Currently    Types: "Crack" cocaine, Cocaine    Comment: 11-15-2017  per pt last used March 2019  . Sexual activity: Not Currently  Other Topics Concern  . Not on file  Social History Narrative   Lives at home alone.   Right-handed.   Occasional use.   Social Determinants of Health   Financial Resource Strain: Medium Risk  . Difficulty of Paying Living Expenses: Somewhat hard  Food Insecurity: No Food Insecurity  . Worried About Charity fundraiser in the Last Year: Never true  . Ran Out of Food in the Last Year: Never true  Transportation Needs: No Transportation Needs  . Lack of Transportation (Medical): No  . Lack of Transportation (Non-Medical): No  Physical Activity: Inactive  . Days of Exercise per Week: 0 days  . Minutes of Exercise per Session: 0 min    Stress: No Stress Concern Present  . Feeling of Stress : Only a little  Social Connections: Socially Isolated  . Frequency of Communication with Friends and Family: Twice a week  . Frequency of Social Gatherings with Friends and Family: Once a week  . Attends Religious Services: Never  . Active Member of Clubs or Organizations: No  . Attends Archivist Meetings: Never  . Marital Status: Divorced  Human resources officer Violence: Not At Risk  . Fear of Current or Ex-Partner: No  . Emotionally Abused: No  . Physically Abused: No  . Sexually Abused: No     Physical Exam   Vitals:  04/23/20 1102  BP: (!) 151/71  Pulse: 76  Resp: 15  Temp: 99 F (37.2 C)  SpO2: 100%    CONSTITUTIONAL: Chronically ill-appearing, NAD NEURO:  Alert and oriented x 3, normal and symmetric strength and sensation, normal coordination, normal speech EYES:  eyes equal and reactive ENT/NECK:  no LAD, no JVD, c-collar in place CARDIO: Regular rate, well-perfused, normal S1 and S2 PULM:  CTAB no wheezing or rhonchi GI/GU:  normal bowel sounds, non-distended, non-tender MSK/SPINE:  No gross deformities, no edema SKIN:  no rash, atraumatic PSYCH:  Appropriate speech and behavior  *Additional and/or pertinent findings included in MDM below  Diagnostic and Interventional Summary    EKG Interpretation  Date/Time:    Ventricular Rate:    PR Interval:    QRS Duration:   QT Interval:    QTC Calculation:   R Axis:     Text Interpretation:        Labs Reviewed - No data to display  CT HEAD WO CONTRAST  Final Result    CT CERVICAL SPINE WO CONTRAST  Final Result      Medications - No data to display   Procedures  /  Critical Care Procedures  ED Course and Medical Decision Making  I have reviewed the triage vital signs, the nursing notes, and pertinent available records from the EMR.  Listed above are laboratory and imaging tests that I personally ordered, reviewed, and interpreted  and then considered in my medical decision making (see below for details).  Mechanical fall, very recent neurosurgical procedure on the cervical spine, here with some paresthesias to the right hand, will start with CT imaging of the head and cervical spine, would consider MRI if continues to have any neurological change or symptoms.     CT imaging is reassuring, on reassessment patient feels completely back to baseline, no continued paresthesia of the right hand, continued reassuring neurological exam with no numbness or weakness, appropriate for discharge.  Barth Kirks. [place]onia Small, [place] [email or web address]  Final Clinical Impressions(s) / ED Diagnoses     ICD-10-CM   1. Fall, initial encounter  W19.[facility]     ED Discharge Orders    None       Discharge Instructions Discussed with and Provided to Patient:     Discharge Instructions     You were evaluated in the Emergency Department and after careful evaluation, we did not find any emergent condition requiring admission or further testing in the hospital.  Your exam/testing today was overall reassuring.  CT scans did not show any significant injuries.  Please return to the Emergency Department if you experience any worsening of your condition.  Thank you for allowing [name] to be a part of your care.        Maudie Flakes, MD 04/23/20 1318

## 2020-04-23 NOTE — Discharge Instructions (Addendum)
You were evaluated in the Emergency Department and after careful evaluation, we did not find any emergent condition requiring admission or further testing in the hospital.  Your exam/testing today was overall reassuring.  CT scans did not show any significant injuries.  Please return to the Emergency Department if you experience any worsening of your condition.  Thank you for allowing [name] to be a part of your care.

## 2020-04-27 DIAGNOSIS — U071 COVID-19: Secondary | ICD-10-CM | POA: Diagnosis not present

## 2020-04-27 DIAGNOSIS — R5383 Other fatigue: Secondary | ICD-10-CM | POA: Diagnosis not present

## 2020-04-27 DIAGNOSIS — Z7901 Long term (current) use of anticoagulants: Secondary | ICD-10-CM | POA: Diagnosis not present

## 2020-04-27 DIAGNOSIS — R5381 Other malaise: Secondary | ICD-10-CM | POA: Diagnosis not present

## 2020-04-27 DIAGNOSIS — M6281 Muscle weakness (generalized): Secondary | ICD-10-CM | POA: Diagnosis not present

## 2020-04-30 DIAGNOSIS — N182 Chronic kidney disease, stage 2 (mild): Secondary | ICD-10-CM | POA: Diagnosis not present

## 2020-04-30 DIAGNOSIS — Z8616 Personal history of COVID-19: Secondary | ICD-10-CM | POA: Diagnosis not present

## 2020-04-30 DIAGNOSIS — E1122 Type 2 diabetes mellitus with diabetic chronic kidney disease: Secondary | ICD-10-CM | POA: Diagnosis not present

## 2020-05-04 DIAGNOSIS — M4802 Spinal stenosis, cervical region: Secondary | ICD-10-CM | POA: Diagnosis not present

## 2020-05-04 DIAGNOSIS — I5030 Unspecified diastolic (congestive) heart failure: Secondary | ICD-10-CM | POA: Diagnosis not present

## 2020-05-04 DIAGNOSIS — G959 Disease of spinal cord, unspecified: Secondary | ICD-10-CM | POA: Diagnosis not present

## 2020-05-04 DIAGNOSIS — M6281 Muscle weakness (generalized): Secondary | ICD-10-CM | POA: Diagnosis not present

## 2020-05-04 DIAGNOSIS — E785 Hyperlipidemia, unspecified: Secondary | ICD-10-CM | POA: Diagnosis not present

## 2020-05-04 DIAGNOSIS — E1169 Type 2 diabetes mellitus with other specified complication: Secondary | ICD-10-CM | POA: Diagnosis not present

## 2020-05-04 DIAGNOSIS — I10 Essential (primary) hypertension: Secondary | ICD-10-CM | POA: Diagnosis not present

## 2020-05-04 DIAGNOSIS — N182 Chronic kidney disease, stage 2 (mild): Secondary | ICD-10-CM | POA: Diagnosis not present

## 2020-05-04 DIAGNOSIS — G629 Polyneuropathy, unspecified: Secondary | ICD-10-CM | POA: Diagnosis not present

## 2020-05-04 DIAGNOSIS — Z8616 Personal history of COVID-19: Secondary | ICD-10-CM | POA: Diagnosis not present

## 2020-05-06 DIAGNOSIS — M4802 Spinal stenosis, cervical region: Secondary | ICD-10-CM | POA: Diagnosis not present

## 2020-05-06 DIAGNOSIS — E1142 Type 2 diabetes mellitus with diabetic polyneuropathy: Secondary | ICD-10-CM | POA: Diagnosis not present

## 2020-05-06 DIAGNOSIS — I5032 Chronic diastolic (congestive) heart failure: Secondary | ICD-10-CM | POA: Diagnosis not present

## 2020-05-06 DIAGNOSIS — I10 Essential (primary) hypertension: Secondary | ICD-10-CM | POA: Diagnosis not present

## 2020-05-07 ENCOUNTER — Other Ambulatory Visit: Payer: Self-pay | Admitting: *Deleted

## 2020-05-07 ENCOUNTER — Ambulatory Visit: Payer: Medicare Other | Admitting: Podiatry

## 2020-05-07 NOTE — Patient Outreach (Signed)
Member screened for potential [facility] Care Management needs as a benefit of [facility] [employer].   [name] has been receiving therapy at [facility]. [name] was active with [facility] Management prior to admission.  Verified with SNF SW that member transitioned to [facility] ALF on yesterday 05/06/20.   No further [facility] Care Management needs identified. Notification sent to [facility] to make aware of above notes.   Marthenia Rolling, MSN-Ed, RN,BSN [place] Acute Care Coordinator [phone] [facility]) [phone]  (Toll free office)

## 2020-05-08 ENCOUNTER — Other Ambulatory Visit: Payer: Self-pay

## 2020-05-08 ENCOUNTER — Encounter (HOSPITAL_BASED_OUTPATIENT_CLINIC_OR_DEPARTMENT_OTHER): Payer: Self-pay

## 2020-05-08 ENCOUNTER — Emergency Department (HOSPITAL_BASED_OUTPATIENT_CLINIC_OR_DEPARTMENT_OTHER): Payer: Medicare Other

## 2020-05-08 ENCOUNTER — Emergency Department (HOSPITAL_BASED_OUTPATIENT_CLINIC_OR_DEPARTMENT_OTHER)
Admission: EM | Admit: 2020-05-08 | Discharge: 2020-05-08 | Disposition: A | Discharge status: Home / Self Care | Payer: Medicare Other | Attending: Emergency Medicine | Admitting: Emergency Medicine

## 2020-05-08 DIAGNOSIS — Z794 Long term (current) use of insulin: Secondary | ICD-10-CM | POA: Insufficient documentation

## 2020-05-08 DIAGNOSIS — W06XXXA Fall from bed, initial encounter: Secondary | ICD-10-CM | POA: Insufficient documentation

## 2020-05-08 DIAGNOSIS — I5032 Chronic diastolic (congestive) heart failure: Secondary | ICD-10-CM | POA: Insufficient documentation

## 2020-05-08 DIAGNOSIS — Z87891 Personal history of nicotine dependence: Secondary | ICD-10-CM | POA: Diagnosis not present

## 2020-05-08 DIAGNOSIS — T148XXA Other injury of unspecified body region, initial encounter: Secondary | ICD-10-CM

## 2020-05-08 DIAGNOSIS — G319 Degenerative disease of nervous system, unspecified: Secondary | ICD-10-CM | POA: Diagnosis not present

## 2020-05-08 DIAGNOSIS — S0990XA Unspecified injury of head, initial encounter: Secondary | ICD-10-CM | POA: Diagnosis not present

## 2020-05-08 DIAGNOSIS — M2550 Pain in unspecified joint: Secondary | ICD-10-CM | POA: Diagnosis not present

## 2020-05-08 DIAGNOSIS — W19XXXA Unspecified fall, initial encounter: Secondary | ICD-10-CM

## 2020-05-08 DIAGNOSIS — R220 Localized swelling, mass and lump, head: Secondary | ICD-10-CM | POA: Diagnosis not present

## 2020-05-08 DIAGNOSIS — N182 Chronic kidney disease, stage 2 (mild): Secondary | ICD-10-CM | POA: Insufficient documentation

## 2020-05-08 DIAGNOSIS — Z7982 Long term (current) use of aspirin: Secondary | ICD-10-CM | POA: Insufficient documentation

## 2020-05-08 DIAGNOSIS — S0083XA Contusion of other part of head, initial encounter: Secondary | ICD-10-CM | POA: Diagnosis not present

## 2020-05-08 DIAGNOSIS — S199XXA Unspecified injury of neck, initial encounter: Secondary | ICD-10-CM | POA: Diagnosis not present

## 2020-05-08 DIAGNOSIS — I130 Hypertensive heart and chronic kidney disease with heart failure and stage 1 through stage 4 chronic kidney disease, or unspecified chronic kidney disease: Secondary | ICD-10-CM | POA: Diagnosis not present

## 2020-05-08 DIAGNOSIS — S0093XA Contusion of unspecified part of head, initial encounter: Secondary | ICD-10-CM | POA: Insufficient documentation

## 2020-05-08 DIAGNOSIS — Z79899 Other long term (current) drug therapy: Secondary | ICD-10-CM | POA: Diagnosis not present

## 2020-05-08 DIAGNOSIS — E1140 Type 2 diabetes mellitus with diabetic neuropathy, unspecified: Secondary | ICD-10-CM | POA: Diagnosis not present

## 2020-05-08 DIAGNOSIS — Z7401 Bed confinement status: Secondary | ICD-10-CM | POA: Diagnosis not present

## 2020-05-08 DIAGNOSIS — S0081XA Abrasion of other part of head, initial encounter: Secondary | ICD-10-CM | POA: Insufficient documentation

## 2020-05-08 DIAGNOSIS — M47812 Spondylosis without myelopathy or radiculopathy, cervical region: Secondary | ICD-10-CM | POA: Diagnosis not present

## 2020-05-08 DIAGNOSIS — I959 Hypotension, unspecified: Secondary | ICD-10-CM | POA: Diagnosis not present

## 2020-05-08 DIAGNOSIS — R52 Pain, unspecified: Secondary | ICD-10-CM | POA: Diagnosis not present

## 2020-05-08 NOTE — ED Notes (Signed)
Transport notified, attempted to call facility to give report, awaiting call back

## 2020-05-08 NOTE — ED Triage Notes (Signed)
Pt BIB EMS, was sitting on bed and fell back and hit posterior head on wall. Not on blood thinners, no LOC.

## 2020-05-08 NOTE — ED Notes (Signed)
Cleaned posterior head with saline and gauze, blood cleaned off head and revealed very small puncture wound at site of hematoma, scant amount of bloody discharge noted.

## 2020-05-08 NOTE — ED Provider Notes (Signed)
[facility] EMERGENCY DEPARTMENT Provider Note   CSN: [id number] Arrival date & time: 05/08/20  6948     History Chief Complaint  Patient presents with  . Fall    [name] is a 59 y.o. male.  The history is provided by the patient.  Fall This is a new problem. The current episode started 6 to 12 hours ago. The problem has been resolved. Pertinent negatives include no chest pain, no abdominal pain, no headaches and no shortness of breath. Nothing aggravates the symptoms. Nothing relieves the symptoms. He has tried nothing for the symptoms. The treatment provided no relief.       Past Medical History:  Diagnosis Date  . Alcohol abuse    11-15-2017  per pt last alcohol Dec 2018  . BPH with obstruction/lower urinary tract symptoms   . Chronic arm pain   . Chronic pain    arms, leg, back  . CKD (chronic kidney disease), stage II   . Cocaine abuse ([place])    11-15-2017  per pt last used March 2019  . Congenital hydrocephalus, unspecified (HCC)    slow progression  . Diabetic retinopathy of both eyes ([place])   . Diastolic CHF, chronic ([place]) 07/2017  . Gait instability    multifactorial -- slow worsening congenital hydrocephalus, peripheral neuropathy, left C5-6 cord lesion  . Hematuria   . History of acute pulmonary edema 07/2017  . History of spinal cord injury 01/02/2014   pt fell, caused spinal cord contusion at C5-6--  residual left side weakness    . 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 History of TIA (transient ischemic attack) and stroke    hx cva 12-20-2010 and  TIA 02-22-2011--- no residual's from cva or tia  . Hyperlipidemia   . Hypertension    followed by pcp  . 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 Lesion of bladder   . Peripheral edema    chronic LUE  . Peripheral neuropathy   . Type 2 diabetes mellitus ([place])    followed by dr Karle Plumber--  last A1c 6.8 on 11-06-2017  . Weak urinary stream     Patient Active Problem List   Diagnosis Date Noted  . Cervical myelopathy ([place]) 03/11/2020  . Cervical stenosis of spinal canal 03/05/2020  . Spinal stenosis, cervical region 02/26/2020  . Cervical spinal cord compression ([place])   . Diastolic congestive heart failure ([place]) 02/12/2020  . History of spinal cord injury 04/17/2019  . TIA (transient ischemic attack) 03/05/2018  . Benign essential HTN 03/05/2018  . Sensorineural hearing loss of both ears 12/04/2017  . Mild nonproliferative diabetic retinopathy ([place]) 01/10/2016  . Hypertensive retinopathy of both eyes 01/10/2016  . Spinal stenosis of lumbar region 07/15/2015  . Abnormality of gait 06/07/2015  . Hydrocephalus ([place]) 06/07/2015  . Peripheral neuropathy 04/26/2015  . Constipation 04/26/2015  . Type 2 diabetes mellitus ([place]) 03/16/2015  . Decreased hearing of both ears 03/15/2015  . Tinea pedis 03/15/2015  . Pre-ulcerative calluses 05/22/2014  . Cocaine abuse ([place]) 12/28/2013  . Left-sided weakness 12/26/2013  . Hyperlipidemia associated with type 2 diabetes mellitus ([place]) 04/01/2007  . Class 2 severe obesity due to excess calories with serious comorbidity and body mass index (BMI) of 36.0 to 36.9 in adult ([place]) 03/28/2007  . Essential hypertension 03/28/2007    Past Surgical History:  Procedure Laterality Date  . CARDIAC CATHETERIZATION  12/01/2008   dr hochrein   abnormal stress myoview:  mild coronary plaque, normal LVF  . CARDIAC CATHETERIZATION  04/29/2011   dr [place]ique   in setting ECG with new ST elevation & severe hypertensive:  nonobstructive atherosclerotic CAD, normal LVF (30% mRCA)   . CARDIOVASCULAR STRESS TEST  08/15/2010   Low nuclear study w/ no evidence ishemia/  ef 42% with lateral and apical hypokinesis  . CYSTOSCOPY WITH BIOPSY N/A 11/20/2017   Procedure: CYSTOSCOPY WITH BIOPSY AND FULGURATION;  Surgeon: [name], MD;  Location: [facility];  Service: Urology;  Laterality: N/A;  . INCISION AND DRAINAGE RIGHT DISTAL MEDIAL THIGH HEMATOMA  04-14-2005   dr Ninfa Linden  . left arm skin graft  1976   injury  . LEFT HEART CATHETERIZATION WITH CORONARY ANGIOGRAM Bilateral 12/26/2013   Procedure: LEFT HEART CATHETERIZATION WITH CORONARY ANGIOGRAM;  Surgeon: Burnell Blanks, MD;  Location: [facility] CATH LAB;  Service: Cardiovascular;  Laterality: Bilateral;  STEMI, in setting cocaine/ alcohol :  mid disease in the RCA (20%), moderate disease in the small caliber intermediate branch (distal 50-60%), normal LVSF (ef 55-60%)  . POSTERIOR CERVICAL FUSION/FORAMINOTOMY N/A 03/05/2020   Procedure: CERVICAL THREE-FOUR, CERVICAL FOUR-FIVE, CERVICAL FIVE-SIX POSTERIOR CERVICAL FUSION/FORAMINOTOMY;  Surgeon: Eustace Moore, MD;  Location: [facility];  Service: Neurosurgery;  Laterality: N/A;  . TOOTH EXTRACTION N/A 05/16/2018   Procedure: DENTAL RESTORATION/EXTRACTIONS WITH ALVEO;  Surgeon: Diona Browner, DDS;  Location: [facility];  Service: Oral Surgery;  Laterality: N/A;  . TRANSTHORACIC ECHOCARDIOGRAM  08/05/2017   moderate LVH,  ef 55%,  grade 1 diastolic dysfunction/  mild LAE/  trivial Tr       Family History  Problem Relation Age of Onset  . Hypertension Mother   . Diabetes Mother   . Cancer Mother        breast cancer   . Hypertension Father   . Heart disease Father   . Stroke Father   . Hypertension Sister     Social History   Tobacco Use  . Smoking status: Former Smoker    Years: 45.00    Types: Cigarettes    Quit date: 03/03/2018    Years since quitting: 2.1  . Smokeless tobacco: Never Used  . Tobacco comment: 11-15-2017  per pt 1pp3days  Vaping Use  . Vaping Use: Never assessed  Substance Use Topics  . Alcohol use: Not Currently    Alcohol/week: 0.0 standard drinks    Comment: 11-15-2017  per pt last alcohol 12/ 2018  alcohol abuse  . Drug use: Not Currently    Types: "Crack" cocaine,  Cocaine    Comment: 11-15-2017  per pt last used March 2019    Home Medications Prior to Admission medications   Medication Sig Start Date End Date Taking? Authorizing Provider  acetaminophen (TYLENOL) 325 MG tablet Take 2 tablets (650 mg total) by mouth every 4 (four) hours as needed for mild pain ((score 1 to 3) or temp > 100.5). 03/24/20   Angiulli, Lavon Paganini, PA-C  ASPIR-LOW 81 MG EC tablet TAKE 1 TABLET (81 MG TOTAL) BY MOUTH DAILY. Patient taking differently: Take 81 mg by mouth daily.  01/30/20   Ladell Pier, MD  atorvastatin (LIPITOR) 40 MG tablet Take 1 tablet (40 mg total) by mouth daily. 03/26/20   Angiulli, Lavon Paganini, PA-C  Baclofen 5 MG TABS Take 5 mg by mouth 3 (three) times daily. 03/24/20   Angiulli, Lavon Paganini, PA-C  bisacodyl (DULCOLAX)  10 MG suppository Place 1 suppository (10 mg total) rectally at bedtime. 03/24/20   Angiulli, Lavon Paganini, PA-C  DULoxetine (CYMBALTA) 60 MG capsule TAKE ONE CAPSULE BY MOUTH DAILY FOR LEG AND BACK PAINS Patient taking differently: Take 60 mg by mouth daily.  02/10/20   Ladell Pier, MD  enoxaparin (LOVENOX) 40 MG/0.4ML injection Inject 0.4 mLs (40 mg total) into the skin daily. 03/24/20 06/05/20  Angiulli, Lavon Paganini, PA-C  furosemide (LASIX) 40 MG tablet TAKE ONE TABLET BY MOUTH DAILY . TAKE EXTRA TABLET FOR TWO DAYS AS NEEDED FOR INCREASE LOWER EXTREMITY SWELLING Patient taking differently: Take 40 mg by mouth See admin instructions. TAKE ONE TABLET BY MOUTH DAILY . TAKE EXTRA TABLET FOR TWO DAYS AS NEEDED FOR INCREASE LOWER EXTREMITY SWELLING 02/10/20   Ladell Pier, MD  hydrALAZINE (APRESOLINE) 25 MG tablet Take 1 tablet (25 mg total) by mouth 2 (two) times daily. 03/24/20   Angiulli, Lavon Paganini, PA-C  insulin aspart (NOVOLOG) 100 UNIT/ML injection Inject 6 Units into the skin 3 (three) times daily with meals. 03/24/20   Angiulli, Lavon Paganini, PA-C  insulin glargine (LANTUS) 100 UNIT/ML injection Inject 0.43 mLs (43 Units total) into the skin daily.  03/25/20   Angiulli, Lavon Paganini, PA-C  labetalol (NORMODYNE) 100 MG tablet Take 0.5 tablets (50 mg total) by mouth 2 (two) times daily. 03/24/20   Angiulli, Lavon Paganini, PA-C  oxyCODONE (OXY IR/ROXICODONE) 5 MG immediate release tablet Take 1 tablet (5 mg total) by mouth every 3 (three) hours as needed for severe pain ((score 7 to 10)). 03/24/20   Angiulli, Lavon Paganini, PA-C  senna (SENOKOT) 8.6 MG TABS tablet Take 1 tablet (8.6 mg total) by mouth 2 (two) times daily. 03/24/20   Angiulli, Lavon Paganini, PA-C    Allergies    Patient has no known allergies.  Review of Systems   Review of Systems  Constitutional: Negative for chills and fever.  HENT: Negative for ear pain and sore throat.   Eyes: Negative for pain and visual disturbance.  Respiratory: Negative for cough and shortness of breath.   Cardiovascular: Negative for chest pain and palpitations.  Gastrointestinal: Negative for abdominal pain and vomiting.  Genitourinary: Negative for dysuria and hematuria.  Musculoskeletal: Negative for arthralgias and back pain.  Skin: Positive for wound. Negative for color change and rash.  Neurological: Negative for seizures, syncope and headaches.  All other systems reviewed and are negative.   Physical Exam Updated Vital Signs BP 126/89 (BP Location: Right Arm)   Pulse 77   Temp 98.3 F (36.8 C) (Oral)   Resp 18   Ht 6\' 3"  (1.905 m)   Wt 101.8 kg   SpO2 98%   BMI 28.05 kg/m   Physical Exam Vitals and nursing note reviewed.  Constitutional:      Appearance: He is well-developed.  HENT:     Head:     Comments: Small hematoma to the occiput with puncture wound that is hemostatic Eyes:     Extraocular Movements: Extraocular movements intact.     Conjunctiva/sclera: Conjunctivae normal.     Pupils: Pupils are equal, round, and reactive to light.  Cardiovascular:     Rate and Rhythm: Normal rate and regular rhythm.     Heart sounds: No murmur heard.   Pulmonary:     Effort: Pulmonary effort is  normal. No respiratory distress.     Breath sounds: Normal breath sounds.  Abdominal:     Palpations: Abdomen is soft.  Tenderness: There is no abdominal tenderness.  Musculoskeletal:        General: Normal range of motion.     Cervical back: Normal range of motion and neck supple. No tenderness.  Skin:    General: Skin is warm and dry.     Capillary Refill: Capillary refill takes less than 2 seconds.  Neurological:     General: No focal deficit present.     Mental Status: He is alert and oriented to person, place, and time.     Cranial Nerves: No cranial nerve deficit.     Sensory: No sensory deficit.     Motor: No weakness.     Coordination: Coordination normal.     ED Results / Procedures / Treatments   Labs (all labs ordered are listed, but only abnormal results are displayed) Labs Reviewed - No data to display  EKG None  Radiology CT Head Wo Contrast  Result Date: 05/08/2020 CLINICAL DATA:  Fall, posterior head injury EXAM: CT HEAD WITHOUT CONTRAST CT CERVICAL SPINE WITHOUT CONTRAST TECHNIQUE: Multidetector CT imaging of the head and cervical spine was performed following the standard protocol without intravenous contrast. Multiplanar CT image reconstructions of the cervical spine were also generated. COMPARISON:  04/23/2020 FINDINGS: CT HEAD FINDINGS Brain: No evidence of acute infarction, hemorrhage, extra-axial collection or mass lesion/mass effect. Global cortical and central atrophy. Superimposed ventriculomegaly without enlargement of the 3rd and 4th ventricles, chronic. Absence of the interventricular septum. Vascular: Mild subcortical white matter and periventricular small vessel ischemic changes. Skull: Intracranial atherosclerosis. Sinuses/Orbits: The visualized paranasal sinuses are essentially clear. The mastoid air cells are unopacified. Other: Mild cutaneous thickening along the right posterior scalp. CT CERVICAL SPINE FINDINGS Alignment: Reversal of the normal  cervical lordosis. Skull base and vertebrae: No acute fracture. No primary bone lesion or focal pathologic process. Soft tissues and spinal canal: No prevertebral fluid or swelling. No visible canal hematoma. Disc levels: Moderate degenerative changes of the mid/lower cervical spine. Posterior laminectomy with cervical fixation at C3-6. Upper chest: Visualized lung apices are clear. Other: Visualized thyroid is unremarkable. IMPRESSION: No evidence of acute intracranial abnormality. Atrophy with chronic ventriculomegaly, as above. No evidence of acute traumatic injury to the cervical spine. Moderate degenerative changes of the mid/lower cervical spine. Posterior laminectomy with cervical fixation at C3-6. Electronically Signed   By: Julian Hy M.D.   On: 05/08/2020 10:03   CT Cervical Spine Wo Contrast  Result Date: 05/08/2020 CLINICAL DATA:  Fall, posterior head injury EXAM: CT HEAD WITHOUT CONTRAST CT CERVICAL SPINE WITHOUT CONTRAST TECHNIQUE: Multidetector CT imaging of the head and cervical spine was performed following the standard protocol without intravenous contrast. Multiplanar CT image reconstructions of the cervical spine were also generated. COMPARISON:  04/23/2020 FINDINGS: CT HEAD FINDINGS Brain: No evidence of acute infarction, hemorrhage, extra-axial collection or mass lesion/mass effect. Global cortical and central atrophy. Superimposed ventriculomegaly without enlargement of the 3rd and 4th ventricles, chronic. Absence of the interventricular septum. Vascular: Mild subcortical white matter and periventricular small vessel ischemic changes. Skull: Intracranial atherosclerosis. Sinuses/Orbits: The visualized paranasal sinuses are essentially clear. The mastoid air cells are unopacified. Other: Mild cutaneous thickening along the right posterior scalp. CT CERVICAL SPINE FINDINGS Alignment: Reversal of the normal cervical lordosis. Skull base and vertebrae: No acute fracture. No primary  bone lesion or focal pathologic process. Soft tissues and spinal canal: No prevertebral fluid or swelling. No visible canal hematoma. Disc levels: Moderate degenerative changes of the mid/lower cervical spine. Posterior laminectomy with cervical  fixation at C3-6. Upper chest: Visualized lung apices are clear. Other: Visualized thyroid is unremarkable. IMPRESSION: No evidence of acute intracranial abnormality. Atrophy with chronic ventriculomegaly, as above. No evidence of acute traumatic injury to the cervical spine. Moderate degenerative changes of the mid/lower cervical spine. Posterior laminectomy with cervical fixation at C3-6. Electronically Signed   By: Julian Hy M.D.   On: 05/08/2020 10:03    Procedures Procedures (including critical care time)  Medications Ordered in ED Medications - No data to display  ED Course  I have reviewed the triage vital signs and the nursing notes.  Pertinent labs & imaging results that were available during my care of the patient were reviewed by me and considered in my medical decision making (see chart for details).    MDM Rules/Calculators/A&P                          Trev SANKALP FERRELL is a 59 year old male with history of chronic pain, alcohol abuse, wheelchair-bound presents the ED from facility after fall.  Patient fell back while sitting on his bed and hit concrete wall.  Has small hematoma to the back of his head but no obvious laceration.  Small puncture wound that is hemostatic.  No need for repair.  CT scan of head and neck are unremarkable.  Neurologically intact.  Does not have any signs to suggest cervical cord injury.  Did have recent surgery to his C-spine.  Overall given reassurance and discharged in ED in good condition.  This chart was dictated using voice recognition software.  Despite best efforts to proofread,  errors can occur which can change the documentation meaning.   Final Clinical Impression(s) / ED Diagnoses Final  diagnoses:  Fall, initial encounter  Contusion of head, unspecified part of head, initial encounter  Abrasion    Rx / DC Orders ED Discharge Orders    None       Lennice Sites, DO 05/08/20 1016

## 2020-05-08 NOTE — ED Notes (Signed)
Report given to [place]

## 2020-05-10 ENCOUNTER — Other Ambulatory Visit: Payer: Self-pay | Admitting: *Deleted

## 2020-05-10 DIAGNOSIS — R278 Other lack of coordination: Secondary | ICD-10-CM | POA: Diagnosis not present

## 2020-05-10 DIAGNOSIS — G959 Disease of spinal cord, unspecified: Secondary | ICD-10-CM | POA: Diagnosis not present

## 2020-05-10 DIAGNOSIS — M4800 Spinal stenosis, site unspecified: Secondary | ICD-10-CM | POA: Diagnosis not present

## 2020-05-10 DIAGNOSIS — M6281 Muscle weakness (generalized): Secondary | ICD-10-CM | POA: Diagnosis not present

## 2020-05-10 NOTE — Patient Outreach (Signed)
[facility]) Care Management  05/10/2020  Rubens TORREZ RENFROE [birth year] [id number]   Notified by post acute care coordinator that member has discharged from SNF to ALF for long term care.  Will close case at this time, no further needs identified.  [name], [place], MSN [place] [phone]

## 2020-05-11 ENCOUNTER — Ambulatory Visit: Payer: Medicare Other | Admitting: Physician Assistant

## 2020-05-12 ENCOUNTER — Other Ambulatory Visit: Payer: Self-pay | Admitting: Internal Medicine

## 2020-05-13 DIAGNOSIS — R278 Other lack of coordination: Secondary | ICD-10-CM | POA: Diagnosis not present

## 2020-05-13 DIAGNOSIS — G959 Disease of spinal cord, unspecified: Secondary | ICD-10-CM | POA: Diagnosis not present

## 2020-05-13 DIAGNOSIS — M6281 Muscle weakness (generalized): Secondary | ICD-10-CM | POA: Diagnosis not present

## 2020-05-13 DIAGNOSIS — M4800 Spinal stenosis, site unspecified: Secondary | ICD-10-CM | POA: Diagnosis not present

## 2020-05-14 ENCOUNTER — Other Ambulatory Visit: Payer: Self-pay

## 2020-05-17 ENCOUNTER — Encounter
Payer: Medicare Other | Attending: Physical Medicine and Rehabilitation | Admitting: Physical Medicine and Rehabilitation

## 2020-05-17 DIAGNOSIS — R278 Other lack of coordination: Secondary | ICD-10-CM | POA: Diagnosis not present

## 2020-05-17 DIAGNOSIS — R2689 Other abnormalities of gait and mobility: Secondary | ICD-10-CM | POA: Diagnosis not present

## 2020-05-17 DIAGNOSIS — M6281 Muscle weakness (generalized): Secondary | ICD-10-CM | POA: Diagnosis not present

## 2020-05-18 DIAGNOSIS — M6281 Muscle weakness (generalized): Secondary | ICD-10-CM | POA: Diagnosis not present

## 2020-05-18 DIAGNOSIS — G959 Disease of spinal cord, unspecified: Secondary | ICD-10-CM | POA: Diagnosis not present

## 2020-05-18 DIAGNOSIS — M4800 Spinal stenosis, site unspecified: Secondary | ICD-10-CM | POA: Diagnosis not present

## 2020-05-18 DIAGNOSIS — R278 Other lack of coordination: Secondary | ICD-10-CM | POA: Diagnosis not present

## 2020-05-19 DIAGNOSIS — R278 Other lack of coordination: Secondary | ICD-10-CM | POA: Diagnosis not present

## 2020-05-19 DIAGNOSIS — M6281 Muscle weakness (generalized): Secondary | ICD-10-CM | POA: Diagnosis not present

## 2020-05-19 DIAGNOSIS — R2689 Other abnormalities of gait and mobility: Secondary | ICD-10-CM | POA: Diagnosis not present

## 2020-05-20 DIAGNOSIS — M4800 Spinal stenosis, site unspecified: Secondary | ICD-10-CM | POA: Diagnosis not present

## 2020-05-20 DIAGNOSIS — M6281 Muscle weakness (generalized): Secondary | ICD-10-CM | POA: Diagnosis not present

## 2020-05-20 DIAGNOSIS — G959 Disease of spinal cord, unspecified: Secondary | ICD-10-CM | POA: Diagnosis not present

## 2020-05-20 DIAGNOSIS — R278 Other lack of coordination: Secondary | ICD-10-CM | POA: Diagnosis not present

## 2020-05-24 DIAGNOSIS — R2689 Other abnormalities of gait and mobility: Secondary | ICD-10-CM | POA: Diagnosis not present

## 2020-05-24 DIAGNOSIS — M6281 Muscle weakness (generalized): Secondary | ICD-10-CM | POA: Diagnosis not present

## 2020-05-24 DIAGNOSIS — R278 Other lack of coordination: Secondary | ICD-10-CM | POA: Diagnosis not present

## 2020-05-25 DIAGNOSIS — G959 Disease of spinal cord, unspecified: Secondary | ICD-10-CM | POA: Diagnosis not present

## 2020-05-25 DIAGNOSIS — M4800 Spinal stenosis, site unspecified: Secondary | ICD-10-CM | POA: Diagnosis not present

## 2020-05-25 DIAGNOSIS — R519 Headache, unspecified: Secondary | ICD-10-CM | POA: Diagnosis not present

## 2020-05-25 DIAGNOSIS — Z9889 Other specified postprocedural states: Secondary | ICD-10-CM | POA: Diagnosis not present

## 2020-05-25 DIAGNOSIS — I10 Essential (primary) hypertension: Secondary | ICD-10-CM | POA: Diagnosis not present

## 2020-05-25 DIAGNOSIS — G9389 Other specified disorders of brain: Secondary | ICD-10-CM | POA: Diagnosis not present

## 2020-05-25 DIAGNOSIS — W19XXXA Unspecified fall, initial encounter: Secondary | ICD-10-CM | POA: Diagnosis not present

## 2020-05-25 DIAGNOSIS — S0990XA Unspecified injury of head, initial encounter: Secondary | ICD-10-CM | POA: Diagnosis not present

## 2020-05-25 DIAGNOSIS — Q040 Congenital malformations of corpus callosum: Secondary | ICD-10-CM | POA: Diagnosis not present

## 2020-05-25 DIAGNOSIS — S161XXA Strain of muscle, fascia and tendon at neck level, initial encounter: Secondary | ICD-10-CM | POA: Diagnosis not present

## 2020-05-25 DIAGNOSIS — S199XXA Unspecified injury of neck, initial encounter: Secondary | ICD-10-CM | POA: Diagnosis not present

## 2020-05-25 DIAGNOSIS — R52 Pain, unspecified: Secondary | ICD-10-CM | POA: Diagnosis not present

## 2020-05-25 DIAGNOSIS — Z981 Arthrodesis status: Secondary | ICD-10-CM | POA: Diagnosis not present

## 2020-05-25 DIAGNOSIS — R278 Other lack of coordination: Secondary | ICD-10-CM | POA: Diagnosis not present

## 2020-05-25 DIAGNOSIS — M6281 Muscle weakness (generalized): Secondary | ICD-10-CM | POA: Diagnosis not present

## 2020-05-25 DIAGNOSIS — W0110XA Fall on same level from slipping, tripping and stumbling with subsequent striking against unspecified object, initial encounter: Secondary | ICD-10-CM | POA: Diagnosis not present

## 2020-05-25 DIAGNOSIS — G319 Degenerative disease of nervous system, unspecified: Secondary | ICD-10-CM | POA: Diagnosis not present

## 2020-05-26 DIAGNOSIS — N390 Urinary tract infection, site not specified: Secondary | ICD-10-CM | POA: Diagnosis not present

## 2020-05-26 DIAGNOSIS — M6281 Muscle weakness (generalized): Secondary | ICD-10-CM | POA: Diagnosis not present

## 2020-05-26 DIAGNOSIS — R2689 Other abnormalities of gait and mobility: Secondary | ICD-10-CM | POA: Diagnosis not present

## 2020-05-26 DIAGNOSIS — R278 Other lack of coordination: Secondary | ICD-10-CM | POA: Diagnosis not present

## 2020-05-27 DIAGNOSIS — G959 Disease of spinal cord, unspecified: Secondary | ICD-10-CM | POA: Diagnosis not present

## 2020-05-27 DIAGNOSIS — M4800 Spinal stenosis, site unspecified: Secondary | ICD-10-CM | POA: Diagnosis not present

## 2020-05-27 DIAGNOSIS — M6281 Muscle weakness (generalized): Secondary | ICD-10-CM | POA: Diagnosis not present

## 2020-05-27 DIAGNOSIS — R278 Other lack of coordination: Secondary | ICD-10-CM | POA: Diagnosis not present

## 2020-05-31 DIAGNOSIS — M6281 Muscle weakness (generalized): Secondary | ICD-10-CM | POA: Diagnosis not present

## 2020-05-31 DIAGNOSIS — R2689 Other abnormalities of gait and mobility: Secondary | ICD-10-CM | POA: Diagnosis not present

## 2020-05-31 DIAGNOSIS — R278 Other lack of coordination: Secondary | ICD-10-CM | POA: Diagnosis not present

## 2020-06-01 DIAGNOSIS — R278 Other lack of coordination: Secondary | ICD-10-CM | POA: Diagnosis not present

## 2020-06-01 DIAGNOSIS — G959 Disease of spinal cord, unspecified: Secondary | ICD-10-CM | POA: Diagnosis not present

## 2020-06-01 DIAGNOSIS — M4800 Spinal stenosis, site unspecified: Secondary | ICD-10-CM | POA: Diagnosis not present

## 2020-06-01 DIAGNOSIS — M6281 Muscle weakness (generalized): Secondary | ICD-10-CM | POA: Diagnosis not present

## 2020-06-02 DIAGNOSIS — M6281 Muscle weakness (generalized): Secondary | ICD-10-CM | POA: Diagnosis not present

## 2020-06-02 DIAGNOSIS — R2689 Other abnormalities of gait and mobility: Secondary | ICD-10-CM | POA: Diagnosis not present

## 2020-06-02 DIAGNOSIS — R278 Other lack of coordination: Secondary | ICD-10-CM | POA: Diagnosis not present

## 2020-06-03 DIAGNOSIS — G959 Disease of spinal cord, unspecified: Secondary | ICD-10-CM | POA: Diagnosis not present

## 2020-06-03 DIAGNOSIS — M4800 Spinal stenosis, site unspecified: Secondary | ICD-10-CM | POA: Diagnosis not present

## 2020-06-03 DIAGNOSIS — R278 Other lack of coordination: Secondary | ICD-10-CM | POA: Diagnosis not present

## 2020-06-03 DIAGNOSIS — M79675 Pain in left toe(s): Secondary | ICD-10-CM | POA: Diagnosis not present

## 2020-06-03 DIAGNOSIS — M6281 Muscle weakness (generalized): Secondary | ICD-10-CM | POA: Diagnosis not present

## 2020-06-03 DIAGNOSIS — M79674 Pain in right toe(s): Secondary | ICD-10-CM | POA: Diagnosis not present

## 2020-06-03 DIAGNOSIS — B351 Tinea unguium: Secondary | ICD-10-CM | POA: Diagnosis not present

## 2020-06-07 DIAGNOSIS — M6281 Muscle weakness (generalized): Secondary | ICD-10-CM | POA: Diagnosis not present

## 2020-06-07 DIAGNOSIS — R2689 Other abnormalities of gait and mobility: Secondary | ICD-10-CM | POA: Diagnosis not present

## 2020-06-07 DIAGNOSIS — R278 Other lack of coordination: Secondary | ICD-10-CM | POA: Diagnosis not present

## 2020-06-08 DIAGNOSIS — M6281 Muscle weakness (generalized): Secondary | ICD-10-CM | POA: Diagnosis not present

## 2020-06-08 DIAGNOSIS — M4800 Spinal stenosis, site unspecified: Secondary | ICD-10-CM | POA: Diagnosis not present

## 2020-06-08 DIAGNOSIS — G959 Disease of spinal cord, unspecified: Secondary | ICD-10-CM | POA: Diagnosis not present

## 2020-06-08 DIAGNOSIS — R278 Other lack of coordination: Secondary | ICD-10-CM | POA: Diagnosis not present

## 2020-06-09 DIAGNOSIS — M6281 Muscle weakness (generalized): Secondary | ICD-10-CM | POA: Diagnosis not present

## 2020-06-09 DIAGNOSIS — R2689 Other abnormalities of gait and mobility: Secondary | ICD-10-CM | POA: Diagnosis not present

## 2020-06-09 DIAGNOSIS — R278 Other lack of coordination: Secondary | ICD-10-CM | POA: Diagnosis not present

## 2020-06-10 DIAGNOSIS — M4800 Spinal stenosis, site unspecified: Secondary | ICD-10-CM | POA: Diagnosis not present

## 2020-06-10 DIAGNOSIS — F4321 Adjustment disorder with depressed mood: Secondary | ICD-10-CM | POA: Diagnosis not present

## 2020-06-10 DIAGNOSIS — R269 Unspecified abnormalities of gait and mobility: Secondary | ICD-10-CM | POA: Diagnosis not present

## 2020-06-10 DIAGNOSIS — M6281 Muscle weakness (generalized): Secondary | ICD-10-CM | POA: Diagnosis not present

## 2020-06-10 DIAGNOSIS — G959 Disease of spinal cord, unspecified: Secondary | ICD-10-CM | POA: Diagnosis not present

## 2020-06-10 DIAGNOSIS — L739 Follicular disorder, unspecified: Secondary | ICD-10-CM | POA: Diagnosis not present

## 2020-06-10 DIAGNOSIS — I5032 Chronic diastolic (congestive) heart failure: Secondary | ICD-10-CM | POA: Diagnosis not present

## 2020-06-10 DIAGNOSIS — R278 Other lack of coordination: Secondary | ICD-10-CM | POA: Diagnosis not present

## 2020-06-10 DIAGNOSIS — E1142 Type 2 diabetes mellitus with diabetic polyneuropathy: Secondary | ICD-10-CM | POA: Diagnosis not present

## 2020-06-14 DIAGNOSIS — G959 Disease of spinal cord, unspecified: Secondary | ICD-10-CM | POA: Diagnosis not present

## 2020-06-14 DIAGNOSIS — M6281 Muscle weakness (generalized): Secondary | ICD-10-CM | POA: Diagnosis not present

## 2020-06-14 DIAGNOSIS — M4800 Spinal stenosis, site unspecified: Secondary | ICD-10-CM | POA: Diagnosis not present

## 2020-06-14 DIAGNOSIS — R278 Other lack of coordination: Secondary | ICD-10-CM | POA: Diagnosis not present

## 2020-06-15 DIAGNOSIS — R278 Other lack of coordination: Secondary | ICD-10-CM | POA: Diagnosis not present

## 2020-06-15 DIAGNOSIS — M6281 Muscle weakness (generalized): Secondary | ICD-10-CM | POA: Diagnosis not present

## 2020-06-15 DIAGNOSIS — G959 Disease of spinal cord, unspecified: Secondary | ICD-10-CM | POA: Diagnosis not present

## 2020-06-15 DIAGNOSIS — M4800 Spinal stenosis, site unspecified: Secondary | ICD-10-CM | POA: Diagnosis not present

## 2020-06-16 DIAGNOSIS — Z20828 Contact with and (suspected) exposure to other viral communicable diseases: Secondary | ICD-10-CM | POA: Diagnosis not present

## 2020-06-16 DIAGNOSIS — R2689 Other abnormalities of gait and mobility: Secondary | ICD-10-CM | POA: Diagnosis not present

## 2020-06-16 DIAGNOSIS — U071 COVID-19: Secondary | ICD-10-CM | POA: Diagnosis not present

## 2020-06-16 DIAGNOSIS — R278 Other lack of coordination: Secondary | ICD-10-CM | POA: Diagnosis not present

## 2020-06-16 DIAGNOSIS — M6281 Muscle weakness (generalized): Secondary | ICD-10-CM | POA: Diagnosis not present

## 2020-06-21 DIAGNOSIS — R2689 Other abnormalities of gait and mobility: Secondary | ICD-10-CM | POA: Diagnosis not present

## 2020-06-21 DIAGNOSIS — M6281 Muscle weakness (generalized): Secondary | ICD-10-CM | POA: Diagnosis not present

## 2020-06-21 DIAGNOSIS — R278 Other lack of coordination: Secondary | ICD-10-CM | POA: Diagnosis not present

## 2020-06-22 DIAGNOSIS — M6281 Muscle weakness (generalized): Secondary | ICD-10-CM | POA: Diagnosis not present

## 2020-06-22 DIAGNOSIS — R278 Other lack of coordination: Secondary | ICD-10-CM | POA: Diagnosis not present

## 2020-06-22 DIAGNOSIS — G959 Disease of spinal cord, unspecified: Secondary | ICD-10-CM | POA: Diagnosis not present

## 2020-06-22 DIAGNOSIS — M4800 Spinal stenosis, site unspecified: Secondary | ICD-10-CM | POA: Diagnosis not present

## 2020-06-23 DIAGNOSIS — R278 Other lack of coordination: Secondary | ICD-10-CM | POA: Diagnosis not present

## 2020-06-23 DIAGNOSIS — M6281 Muscle weakness (generalized): Secondary | ICD-10-CM | POA: Diagnosis not present

## 2020-06-23 DIAGNOSIS — R2689 Other abnormalities of gait and mobility: Secondary | ICD-10-CM | POA: Diagnosis not present

## 2020-06-25 DIAGNOSIS — G959 Disease of spinal cord, unspecified: Secondary | ICD-10-CM | POA: Diagnosis not present

## 2020-06-25 DIAGNOSIS — R278 Other lack of coordination: Secondary | ICD-10-CM | POA: Diagnosis not present

## 2020-06-25 DIAGNOSIS — M4800 Spinal stenosis, site unspecified: Secondary | ICD-10-CM | POA: Diagnosis not present

## 2020-06-25 DIAGNOSIS — M6281 Muscle weakness (generalized): Secondary | ICD-10-CM | POA: Diagnosis not present

## 2020-06-28 DIAGNOSIS — M6281 Muscle weakness (generalized): Secondary | ICD-10-CM | POA: Diagnosis not present

## 2020-06-28 DIAGNOSIS — R278 Other lack of coordination: Secondary | ICD-10-CM | POA: Diagnosis not present

## 2020-06-28 DIAGNOSIS — R2689 Other abnormalities of gait and mobility: Secondary | ICD-10-CM | POA: Diagnosis not present

## 2020-06-29 DIAGNOSIS — G959 Disease of spinal cord, unspecified: Secondary | ICD-10-CM | POA: Diagnosis not present

## 2020-06-29 DIAGNOSIS — M4800 Spinal stenosis, site unspecified: Secondary | ICD-10-CM | POA: Diagnosis not present

## 2020-06-29 DIAGNOSIS — R278 Other lack of coordination: Secondary | ICD-10-CM | POA: Diagnosis not present

## 2020-06-29 DIAGNOSIS — M6281 Muscle weakness (generalized): Secondary | ICD-10-CM | POA: Diagnosis not present

## 2020-06-30 DIAGNOSIS — R278 Other lack of coordination: Secondary | ICD-10-CM | POA: Diagnosis not present

## 2020-06-30 DIAGNOSIS — R2689 Other abnormalities of gait and mobility: Secondary | ICD-10-CM | POA: Diagnosis not present

## 2020-06-30 DIAGNOSIS — M6281 Muscle weakness (generalized): Secondary | ICD-10-CM | POA: Diagnosis not present

## 2020-06-30 DIAGNOSIS — F321 Major depressive disorder, single episode, moderate: Secondary | ICD-10-CM | POA: Diagnosis not present

## 2020-07-02 DIAGNOSIS — M4800 Spinal stenosis, site unspecified: Secondary | ICD-10-CM | POA: Diagnosis not present

## 2020-07-02 DIAGNOSIS — M6281 Muscle weakness (generalized): Secondary | ICD-10-CM | POA: Diagnosis not present

## 2020-07-02 DIAGNOSIS — G959 Disease of spinal cord, unspecified: Secondary | ICD-10-CM | POA: Diagnosis not present

## 2020-07-02 DIAGNOSIS — R278 Other lack of coordination: Secondary | ICD-10-CM | POA: Diagnosis not present

## 2020-07-05 DIAGNOSIS — M6281 Muscle weakness (generalized): Secondary | ICD-10-CM | POA: Diagnosis not present

## 2020-07-05 DIAGNOSIS — R278 Other lack of coordination: Secondary | ICD-10-CM | POA: Diagnosis not present

## 2020-07-05 DIAGNOSIS — R2689 Other abnormalities of gait and mobility: Secondary | ICD-10-CM | POA: Diagnosis not present

## 2020-07-06 DIAGNOSIS — R278 Other lack of coordination: Secondary | ICD-10-CM | POA: Diagnosis not present

## 2020-07-06 DIAGNOSIS — G959 Disease of spinal cord, unspecified: Secondary | ICD-10-CM | POA: Diagnosis not present

## 2020-07-06 DIAGNOSIS — M6281 Muscle weakness (generalized): Secondary | ICD-10-CM | POA: Diagnosis not present

## 2020-07-06 DIAGNOSIS — M4800 Spinal stenosis, site unspecified: Secondary | ICD-10-CM | POA: Diagnosis not present

## 2020-07-09 DIAGNOSIS — R278 Other lack of coordination: Secondary | ICD-10-CM | POA: Diagnosis not present

## 2020-07-09 DIAGNOSIS — M4800 Spinal stenosis, site unspecified: Secondary | ICD-10-CM | POA: Diagnosis not present

## 2020-07-09 DIAGNOSIS — G959 Disease of spinal cord, unspecified: Secondary | ICD-10-CM | POA: Diagnosis not present

## 2020-07-09 DIAGNOSIS — M6281 Muscle weakness (generalized): Secondary | ICD-10-CM | POA: Diagnosis not present

## 2020-07-13 DIAGNOSIS — E1142 Type 2 diabetes mellitus with diabetic polyneuropathy: Secondary | ICD-10-CM | POA: Diagnosis not present

## 2020-07-13 DIAGNOSIS — E785 Hyperlipidemia, unspecified: Secondary | ICD-10-CM | POA: Diagnosis not present

## 2020-07-13 DIAGNOSIS — M4802 Spinal stenosis, cervical region: Secondary | ICD-10-CM | POA: Diagnosis not present

## 2020-07-13 DIAGNOSIS — I10 Essential (primary) hypertension: Secondary | ICD-10-CM | POA: Diagnosis not present

## 2020-07-13 DIAGNOSIS — I5032 Chronic diastolic (congestive) heart failure: Secondary | ICD-10-CM | POA: Diagnosis not present

## 2020-07-13 DIAGNOSIS — G629 Polyneuropathy, unspecified: Secondary | ICD-10-CM | POA: Diagnosis not present

## 2020-07-13 DIAGNOSIS — G919 Hydrocephalus, unspecified: Secondary | ICD-10-CM | POA: Diagnosis not present

## 2020-07-26 DIAGNOSIS — F321 Major depressive disorder, single episode, moderate: Secondary | ICD-10-CM | POA: Diagnosis not present

## 2020-07-29 DIAGNOSIS — R3589 Other polyuria: Secondary | ICD-10-CM | POA: Diagnosis not present

## 2020-07-29 DIAGNOSIS — R339 Retention of urine, unspecified: Secondary | ICD-10-CM | POA: Diagnosis not present

## 2020-07-29 DIAGNOSIS — N138 Other obstructive and reflux uropathy: Secondary | ICD-10-CM | POA: Diagnosis not present

## 2020-07-29 DIAGNOSIS — F4321 Adjustment disorder with depressed mood: Secondary | ICD-10-CM | POA: Diagnosis not present

## 2020-07-29 DIAGNOSIS — N390 Urinary tract infection, site not specified: Secondary | ICD-10-CM | POA: Diagnosis not present

## 2020-07-29 DIAGNOSIS — N401 Enlarged prostate with lower urinary tract symptoms: Secondary | ICD-10-CM | POA: Diagnosis not present

## 2020-08-03 DIAGNOSIS — Z20828 Contact with and (suspected) exposure to other viral communicable diseases: Secondary | ICD-10-CM | POA: Diagnosis not present

## 2020-08-03 DIAGNOSIS — U071 COVID-19: Secondary | ICD-10-CM | POA: Diagnosis not present

## 2020-08-05 DIAGNOSIS — F4321 Adjustment disorder with depressed mood: Secondary | ICD-10-CM | POA: Diagnosis not present

## 2020-08-05 DIAGNOSIS — R278 Other lack of coordination: Secondary | ICD-10-CM | POA: Diagnosis not present

## 2020-08-10 DIAGNOSIS — U071 COVID-19: Secondary | ICD-10-CM | POA: Diagnosis not present

## 2020-08-10 DIAGNOSIS — R278 Other lack of coordination: Secondary | ICD-10-CM | POA: Diagnosis not present

## 2020-08-10 DIAGNOSIS — Z20828 Contact with and (suspected) exposure to other viral communicable diseases: Secondary | ICD-10-CM | POA: Diagnosis not present

## 2020-08-11 DIAGNOSIS — R278 Other lack of coordination: Secondary | ICD-10-CM | POA: Diagnosis not present

## 2020-08-11 DIAGNOSIS — M6281 Muscle weakness (generalized): Secondary | ICD-10-CM | POA: Diagnosis not present

## 2020-08-11 DIAGNOSIS — R2689 Other abnormalities of gait and mobility: Secondary | ICD-10-CM | POA: Diagnosis not present

## 2020-08-11 DIAGNOSIS — M542 Cervicalgia: Secondary | ICD-10-CM | POA: Diagnosis not present

## 2020-08-12 DIAGNOSIS — I5032 Chronic diastolic (congestive) heart failure: Secondary | ICD-10-CM | POA: Diagnosis not present

## 2020-08-12 DIAGNOSIS — G629 Polyneuropathy, unspecified: Secondary | ICD-10-CM | POA: Diagnosis not present

## 2020-08-12 DIAGNOSIS — G9520 Unspecified cord compression: Secondary | ICD-10-CM | POA: Diagnosis not present

## 2020-08-12 DIAGNOSIS — E785 Hyperlipidemia, unspecified: Secondary | ICD-10-CM | POA: Diagnosis not present

## 2020-08-12 DIAGNOSIS — E1142 Type 2 diabetes mellitus with diabetic polyneuropathy: Secondary | ICD-10-CM | POA: Diagnosis not present

## 2020-08-12 DIAGNOSIS — I10 Essential (primary) hypertension: Secondary | ICD-10-CM | POA: Diagnosis not present

## 2020-08-12 DIAGNOSIS — R278 Other lack of coordination: Secondary | ICD-10-CM | POA: Diagnosis not present

## 2020-08-13 DIAGNOSIS — M542 Cervicalgia: Secondary | ICD-10-CM | POA: Diagnosis not present

## 2020-08-13 DIAGNOSIS — R2689 Other abnormalities of gait and mobility: Secondary | ICD-10-CM | POA: Diagnosis not present

## 2020-08-13 DIAGNOSIS — M6281 Muscle weakness (generalized): Secondary | ICD-10-CM | POA: Diagnosis not present

## 2020-08-13 DIAGNOSIS — R278 Other lack of coordination: Secondary | ICD-10-CM | POA: Diagnosis not present

## 2020-08-16 DIAGNOSIS — R2689 Other abnormalities of gait and mobility: Secondary | ICD-10-CM | POA: Diagnosis not present

## 2020-08-16 DIAGNOSIS — R278 Other lack of coordination: Secondary | ICD-10-CM | POA: Diagnosis not present

## 2020-08-16 DIAGNOSIS — M6281 Muscle weakness (generalized): Secondary | ICD-10-CM | POA: Diagnosis not present

## 2020-08-16 DIAGNOSIS — M542 Cervicalgia: Secondary | ICD-10-CM | POA: Diagnosis not present

## 2020-08-17 DIAGNOSIS — R278 Other lack of coordination: Secondary | ICD-10-CM | POA: Diagnosis not present

## 2020-08-18 DIAGNOSIS — R278 Other lack of coordination: Secondary | ICD-10-CM | POA: Diagnosis not present

## 2020-08-18 DIAGNOSIS — M542 Cervicalgia: Secondary | ICD-10-CM | POA: Diagnosis not present

## 2020-08-18 DIAGNOSIS — M6281 Muscle weakness (generalized): Secondary | ICD-10-CM | POA: Diagnosis not present

## 2020-08-18 DIAGNOSIS — R2689 Other abnormalities of gait and mobility: Secondary | ICD-10-CM | POA: Diagnosis not present

## 2020-08-19 DIAGNOSIS — R278 Other lack of coordination: Secondary | ICD-10-CM | POA: Diagnosis not present

## 2020-08-23 DIAGNOSIS — R2689 Other abnormalities of gait and mobility: Secondary | ICD-10-CM | POA: Diagnosis not present

## 2020-08-23 DIAGNOSIS — M542 Cervicalgia: Secondary | ICD-10-CM | POA: Diagnosis not present

## 2020-08-23 DIAGNOSIS — R278 Other lack of coordination: Secondary | ICD-10-CM | POA: Diagnosis not present

## 2020-08-23 DIAGNOSIS — M6281 Muscle weakness (generalized): Secondary | ICD-10-CM | POA: Diagnosis not present

## 2020-08-25 DIAGNOSIS — R278 Other lack of coordination: Secondary | ICD-10-CM | POA: Diagnosis not present

## 2020-08-25 DIAGNOSIS — M6281 Muscle weakness (generalized): Secondary | ICD-10-CM | POA: Diagnosis not present

## 2020-08-25 DIAGNOSIS — R2689 Other abnormalities of gait and mobility: Secondary | ICD-10-CM | POA: Diagnosis not present

## 2020-08-25 DIAGNOSIS — M542 Cervicalgia: Secondary | ICD-10-CM | POA: Diagnosis not present

## 2020-08-26 DIAGNOSIS — R278 Other lack of coordination: Secondary | ICD-10-CM | POA: Diagnosis not present

## 2020-08-27 DIAGNOSIS — R278 Other lack of coordination: Secondary | ICD-10-CM | POA: Diagnosis not present

## 2020-08-30 DIAGNOSIS — M6281 Muscle weakness (generalized): Secondary | ICD-10-CM | POA: Diagnosis not present

## 2020-08-30 DIAGNOSIS — M542 Cervicalgia: Secondary | ICD-10-CM | POA: Diagnosis not present

## 2020-08-30 DIAGNOSIS — R278 Other lack of coordination: Secondary | ICD-10-CM | POA: Diagnosis not present

## 2020-08-30 DIAGNOSIS — R2689 Other abnormalities of gait and mobility: Secondary | ICD-10-CM | POA: Diagnosis not present

## 2020-08-30 DIAGNOSIS — F321 Major depressive disorder, single episode, moderate: Secondary | ICD-10-CM | POA: Diagnosis not present

## 2020-08-31 DIAGNOSIS — R278 Other lack of coordination: Secondary | ICD-10-CM | POA: Diagnosis not present

## 2020-09-01 DIAGNOSIS — M542 Cervicalgia: Secondary | ICD-10-CM | POA: Diagnosis not present

## 2020-09-01 DIAGNOSIS — R278 Other lack of coordination: Secondary | ICD-10-CM | POA: Diagnosis not present

## 2020-09-01 DIAGNOSIS — M6281 Muscle weakness (generalized): Secondary | ICD-10-CM | POA: Diagnosis not present

## 2020-09-01 DIAGNOSIS — R2689 Other abnormalities of gait and mobility: Secondary | ICD-10-CM | POA: Diagnosis not present

## 2020-09-02 DIAGNOSIS — R278 Other lack of coordination: Secondary | ICD-10-CM | POA: Diagnosis not present

## 2020-09-06 DIAGNOSIS — M6281 Muscle weakness (generalized): Secondary | ICD-10-CM | POA: Diagnosis not present

## 2020-09-06 DIAGNOSIS — R278 Other lack of coordination: Secondary | ICD-10-CM | POA: Diagnosis not present

## 2020-09-06 DIAGNOSIS — R2689 Other abnormalities of gait and mobility: Secondary | ICD-10-CM | POA: Diagnosis not present

## 2020-09-06 DIAGNOSIS — M542 Cervicalgia: Secondary | ICD-10-CM | POA: Diagnosis not present

## 2020-09-08 DIAGNOSIS — R278 Other lack of coordination: Secondary | ICD-10-CM | POA: Diagnosis not present

## 2020-09-08 DIAGNOSIS — M542 Cervicalgia: Secondary | ICD-10-CM | POA: Diagnosis not present

## 2020-09-08 DIAGNOSIS — R2689 Other abnormalities of gait and mobility: Secondary | ICD-10-CM | POA: Diagnosis not present

## 2020-09-08 DIAGNOSIS — M6281 Muscle weakness (generalized): Secondary | ICD-10-CM | POA: Diagnosis not present

## 2020-09-09 DIAGNOSIS — G9520 Unspecified cord compression: Secondary | ICD-10-CM | POA: Diagnosis not present

## 2020-09-09 DIAGNOSIS — M4802 Spinal stenosis, cervical region: Secondary | ICD-10-CM | POA: Diagnosis not present

## 2020-09-09 DIAGNOSIS — L732 Hidradenitis suppurativa: Secondary | ICD-10-CM | POA: Diagnosis not present

## 2020-09-09 DIAGNOSIS — M79675 Pain in left toe(s): Secondary | ICD-10-CM | POA: Diagnosis not present

## 2020-09-09 DIAGNOSIS — B351 Tinea unguium: Secondary | ICD-10-CM | POA: Diagnosis not present

## 2020-09-09 DIAGNOSIS — M79674 Pain in right toe(s): Secondary | ICD-10-CM | POA: Diagnosis not present

## 2020-09-09 DIAGNOSIS — E1142 Type 2 diabetes mellitus with diabetic polyneuropathy: Secondary | ICD-10-CM | POA: Diagnosis not present

## 2020-09-09 DIAGNOSIS — E785 Hyperlipidemia, unspecified: Secondary | ICD-10-CM | POA: Diagnosis not present

## 2020-09-09 DIAGNOSIS — I10 Essential (primary) hypertension: Secondary | ICD-10-CM | POA: Diagnosis not present

## 2020-09-09 DIAGNOSIS — I5032 Chronic diastolic (congestive) heart failure: Secondary | ICD-10-CM | POA: Diagnosis not present

## 2020-09-13 DIAGNOSIS — R2689 Other abnormalities of gait and mobility: Secondary | ICD-10-CM | POA: Diagnosis not present

## 2020-09-13 DIAGNOSIS — M542 Cervicalgia: Secondary | ICD-10-CM | POA: Diagnosis not present

## 2020-09-13 DIAGNOSIS — R278 Other lack of coordination: Secondary | ICD-10-CM | POA: Diagnosis not present

## 2020-09-13 DIAGNOSIS — M6281 Muscle weakness (generalized): Secondary | ICD-10-CM | POA: Diagnosis not present

## 2020-09-15 DIAGNOSIS — D518 Other vitamin B12 deficiency anemias: Secondary | ICD-10-CM | POA: Diagnosis not present

## 2020-09-15 DIAGNOSIS — E038 Other specified hypothyroidism: Secondary | ICD-10-CM | POA: Diagnosis not present

## 2020-09-15 DIAGNOSIS — M6281 Muscle weakness (generalized): Secondary | ICD-10-CM | POA: Diagnosis not present

## 2020-09-15 DIAGNOSIS — R2689 Other abnormalities of gait and mobility: Secondary | ICD-10-CM | POA: Diagnosis not present

## 2020-09-15 DIAGNOSIS — E559 Vitamin D deficiency, unspecified: Secondary | ICD-10-CM | POA: Diagnosis not present

## 2020-09-15 DIAGNOSIS — R278 Other lack of coordination: Secondary | ICD-10-CM | POA: Diagnosis not present

## 2020-09-15 DIAGNOSIS — M542 Cervicalgia: Secondary | ICD-10-CM | POA: Diagnosis not present

## 2020-09-15 DIAGNOSIS — E7849 Other hyperlipidemia: Secondary | ICD-10-CM | POA: Diagnosis not present

## 2020-09-15 DIAGNOSIS — B181 Chronic viral hepatitis B without delta-agent: Secondary | ICD-10-CM | POA: Diagnosis not present

## 2020-09-15 DIAGNOSIS — E119 Type 2 diabetes mellitus without complications: Secondary | ICD-10-CM | POA: Diagnosis not present

## 2020-09-15 DIAGNOSIS — Z79899 Other long term (current) drug therapy: Secondary | ICD-10-CM | POA: Diagnosis not present

## 2020-09-16 DIAGNOSIS — N138 Other obstructive and reflux uropathy: Secondary | ICD-10-CM | POA: Diagnosis not present

## 2020-09-16 DIAGNOSIS — F4321 Adjustment disorder with depressed mood: Secondary | ICD-10-CM | POA: Diagnosis not present

## 2020-09-16 DIAGNOSIS — N401 Enlarged prostate with lower urinary tract symptoms: Secondary | ICD-10-CM | POA: Diagnosis not present

## 2020-09-29 DIAGNOSIS — G8929 Other chronic pain: Secondary | ICD-10-CM | POA: Diagnosis not present

## 2020-09-29 DIAGNOSIS — F321 Major depressive disorder, single episode, moderate: Secondary | ICD-10-CM | POA: Diagnosis not present

## 2020-10-07 DIAGNOSIS — I5032 Chronic diastolic (congestive) heart failure: Secondary | ICD-10-CM | POA: Diagnosis not present

## 2020-10-07 DIAGNOSIS — I10 Essential (primary) hypertension: Secondary | ICD-10-CM | POA: Diagnosis not present

## 2020-10-07 DIAGNOSIS — E785 Hyperlipidemia, unspecified: Secondary | ICD-10-CM | POA: Diagnosis not present

## 2020-10-07 DIAGNOSIS — F4321 Adjustment disorder with depressed mood: Secondary | ICD-10-CM | POA: Diagnosis not present

## 2020-10-07 DIAGNOSIS — M4802 Spinal stenosis, cervical region: Secondary | ICD-10-CM | POA: Diagnosis not present

## 2020-10-07 DIAGNOSIS — G629 Polyneuropathy, unspecified: Secondary | ICD-10-CM | POA: Diagnosis not present

## 2020-10-07 DIAGNOSIS — E1142 Type 2 diabetes mellitus with diabetic polyneuropathy: Secondary | ICD-10-CM | POA: Diagnosis not present

## 2020-10-15 DIAGNOSIS — E119 Type 2 diabetes mellitus without complications: Secondary | ICD-10-CM | POA: Diagnosis not present

## 2020-10-15 DIAGNOSIS — I5032 Chronic diastolic (congestive) heart failure: Secondary | ICD-10-CM | POA: Diagnosis not present

## 2020-10-15 DIAGNOSIS — E038 Other specified hypothyroidism: Secondary | ICD-10-CM | POA: Diagnosis not present

## 2020-10-15 DIAGNOSIS — E559 Vitamin D deficiency, unspecified: Secondary | ICD-10-CM | POA: Diagnosis not present

## 2020-10-15 DIAGNOSIS — N182 Chronic kidney disease, stage 2 (mild): Secondary | ICD-10-CM | POA: Diagnosis not present

## 2020-10-15 DIAGNOSIS — E1142 Type 2 diabetes mellitus with diabetic polyneuropathy: Secondary | ICD-10-CM | POA: Diagnosis not present

## 2020-10-15 DIAGNOSIS — I10 Essential (primary) hypertension: Secondary | ICD-10-CM | POA: Diagnosis not present

## 2020-10-15 DIAGNOSIS — E785 Hyperlipidemia, unspecified: Secondary | ICD-10-CM | POA: Diagnosis not present

## 2020-10-19 DIAGNOSIS — N401 Enlarged prostate with lower urinary tract symptoms: Secondary | ICD-10-CM | POA: Diagnosis not present

## 2020-10-19 DIAGNOSIS — N138 Other obstructive and reflux uropathy: Secondary | ICD-10-CM | POA: Diagnosis not present

## 2020-10-19 DIAGNOSIS — R300 Dysuria: Secondary | ICD-10-CM | POA: Diagnosis not present

## 2020-10-20 DIAGNOSIS — G8929 Other chronic pain: Secondary | ICD-10-CM | POA: Diagnosis not present

## 2020-10-20 DIAGNOSIS — F321 Major depressive disorder, single episode, moderate: Secondary | ICD-10-CM | POA: Diagnosis not present

## 2020-10-28 DIAGNOSIS — Z79899 Other long term (current) drug therapy: Secondary | ICD-10-CM | POA: Diagnosis not present

## 2020-11-03 DIAGNOSIS — F321 Major depressive disorder, single episode, moderate: Secondary | ICD-10-CM | POA: Diagnosis not present

## 2020-11-03 DIAGNOSIS — G8929 Other chronic pain: Secondary | ICD-10-CM | POA: Diagnosis not present

## 2020-11-04 DIAGNOSIS — G9520 Unspecified cord compression: Secondary | ICD-10-CM | POA: Diagnosis not present

## 2020-11-04 DIAGNOSIS — E785 Hyperlipidemia, unspecified: Secondary | ICD-10-CM | POA: Diagnosis not present

## 2020-11-04 DIAGNOSIS — I10 Essential (primary) hypertension: Secondary | ICD-10-CM | POA: Diagnosis not present

## 2020-11-04 DIAGNOSIS — G629 Polyneuropathy, unspecified: Secondary | ICD-10-CM | POA: Diagnosis not present

## 2020-11-04 DIAGNOSIS — I5032 Chronic diastolic (congestive) heart failure: Secondary | ICD-10-CM | POA: Diagnosis not present

## 2020-11-04 DIAGNOSIS — F4321 Adjustment disorder with depressed mood: Secondary | ICD-10-CM | POA: Diagnosis not present

## 2020-11-04 DIAGNOSIS — E1142 Type 2 diabetes mellitus with diabetic polyneuropathy: Secondary | ICD-10-CM | POA: Diagnosis not present

## 2020-11-04 DIAGNOSIS — M4802 Spinal stenosis, cervical region: Secondary | ICD-10-CM | POA: Diagnosis not present

## 2020-11-09 DIAGNOSIS — R0781 Pleurodynia: Secondary | ICD-10-CM | POA: Diagnosis not present

## 2020-11-10 DIAGNOSIS — R079 Chest pain, unspecified: Secondary | ICD-10-CM | POA: Diagnosis not present

## 2020-11-17 DIAGNOSIS — E038 Other specified hypothyroidism: Secondary | ICD-10-CM | POA: Diagnosis not present

## 2020-11-17 DIAGNOSIS — I5032 Chronic diastolic (congestive) heart failure: Secondary | ICD-10-CM | POA: Diagnosis not present

## 2020-11-17 DIAGNOSIS — N182 Chronic kidney disease, stage 2 (mild): Secondary | ICD-10-CM | POA: Diagnosis not present

## 2020-11-17 DIAGNOSIS — I10 Essential (primary) hypertension: Secondary | ICD-10-CM | POA: Diagnosis not present

## 2020-11-17 DIAGNOSIS — E559 Vitamin D deficiency, unspecified: Secondary | ICD-10-CM | POA: Diagnosis not present

## 2020-11-17 DIAGNOSIS — E785 Hyperlipidemia, unspecified: Secondary | ICD-10-CM | POA: Diagnosis not present

## 2020-11-17 DIAGNOSIS — E1142 Type 2 diabetes mellitus with diabetic polyneuropathy: Secondary | ICD-10-CM | POA: Diagnosis not present

## 2020-11-17 DIAGNOSIS — E119 Type 2 diabetes mellitus without complications: Secondary | ICD-10-CM | POA: Diagnosis not present

## 2020-12-01 DIAGNOSIS — N182 Chronic kidney disease, stage 2 (mild): Secondary | ICD-10-CM | POA: Diagnosis not present

## 2020-12-01 DIAGNOSIS — E559 Vitamin D deficiency, unspecified: Secondary | ICD-10-CM | POA: Diagnosis not present

## 2020-12-01 DIAGNOSIS — E1142 Type 2 diabetes mellitus with diabetic polyneuropathy: Secondary | ICD-10-CM | POA: Diagnosis not present

## 2020-12-01 DIAGNOSIS — E119 Type 2 diabetes mellitus without complications: Secondary | ICD-10-CM | POA: Diagnosis not present

## 2020-12-01 DIAGNOSIS — E038 Other specified hypothyroidism: Secondary | ICD-10-CM | POA: Diagnosis not present

## 2020-12-01 DIAGNOSIS — E785 Hyperlipidemia, unspecified: Secondary | ICD-10-CM | POA: Diagnosis not present

## 2020-12-01 DIAGNOSIS — I5032 Chronic diastolic (congestive) heart failure: Secondary | ICD-10-CM | POA: Diagnosis not present

## 2020-12-01 DIAGNOSIS — G8929 Other chronic pain: Secondary | ICD-10-CM | POA: Diagnosis not present

## 2020-12-01 DIAGNOSIS — I10 Essential (primary) hypertension: Secondary | ICD-10-CM | POA: Diagnosis not present

## 2020-12-01 DIAGNOSIS — F321 Major depressive disorder, single episode, moderate: Secondary | ICD-10-CM | POA: Diagnosis not present

## 2020-12-02 DIAGNOSIS — G9520 Unspecified cord compression: Secondary | ICD-10-CM | POA: Diagnosis not present

## 2020-12-02 DIAGNOSIS — E785 Hyperlipidemia, unspecified: Secondary | ICD-10-CM | POA: Diagnosis not present

## 2020-12-02 DIAGNOSIS — B351 Tinea unguium: Secondary | ICD-10-CM | POA: Diagnosis not present

## 2020-12-02 DIAGNOSIS — I10 Essential (primary) hypertension: Secondary | ICD-10-CM | POA: Diagnosis not present

## 2020-12-02 DIAGNOSIS — M79674 Pain in right toe(s): Secondary | ICD-10-CM | POA: Diagnosis not present

## 2020-12-02 DIAGNOSIS — M4802 Spinal stenosis, cervical region: Secondary | ICD-10-CM | POA: Diagnosis not present

## 2020-12-02 DIAGNOSIS — E1142 Type 2 diabetes mellitus with diabetic polyneuropathy: Secondary | ICD-10-CM | POA: Diagnosis not present

## 2020-12-02 DIAGNOSIS — M79675 Pain in left toe(s): Secondary | ICD-10-CM | POA: Diagnosis not present

## 2020-12-02 DIAGNOSIS — L732 Hidradenitis suppurativa: Secondary | ICD-10-CM | POA: Diagnosis not present

## 2020-12-02 DIAGNOSIS — I5032 Chronic diastolic (congestive) heart failure: Secondary | ICD-10-CM | POA: Diagnosis not present

## 2020-12-02 DIAGNOSIS — G629 Polyneuropathy, unspecified: Secondary | ICD-10-CM | POA: Diagnosis not present

## 2020-12-07 DIAGNOSIS — N138 Other obstructive and reflux uropathy: Secondary | ICD-10-CM | POA: Diagnosis not present

## 2020-12-07 DIAGNOSIS — N401 Enlarged prostate with lower urinary tract symptoms: Secondary | ICD-10-CM | POA: Diagnosis not present

## 2020-12-15 DIAGNOSIS — R2689 Other abnormalities of gait and mobility: Secondary | ICD-10-CM | POA: Diagnosis not present

## 2020-12-15 DIAGNOSIS — R278 Other lack of coordination: Secondary | ICD-10-CM | POA: Diagnosis not present

## 2020-12-15 DIAGNOSIS — M6281 Muscle weakness (generalized): Secondary | ICD-10-CM | POA: Diagnosis not present

## 2020-12-19 DIAGNOSIS — R2689 Other abnormalities of gait and mobility: Secondary | ICD-10-CM | POA: Diagnosis not present

## 2020-12-19 DIAGNOSIS — R278 Other lack of coordination: Secondary | ICD-10-CM | POA: Diagnosis not present

## 2020-12-19 DIAGNOSIS — M6281 Muscle weakness (generalized): Secondary | ICD-10-CM | POA: Diagnosis not present

## 2020-12-22 DIAGNOSIS — R278 Other lack of coordination: Secondary | ICD-10-CM | POA: Diagnosis not present

## 2020-12-22 DIAGNOSIS — R2689 Other abnormalities of gait and mobility: Secondary | ICD-10-CM | POA: Diagnosis not present

## 2020-12-22 DIAGNOSIS — M6281 Muscle weakness (generalized): Secondary | ICD-10-CM | POA: Diagnosis not present

## 2020-12-23 DIAGNOSIS — R278 Other lack of coordination: Secondary | ICD-10-CM | POA: Diagnosis not present

## 2020-12-23 DIAGNOSIS — R011 Cardiac murmur, unspecified: Secondary | ICD-10-CM | POA: Diagnosis not present

## 2020-12-27 DIAGNOSIS — R278 Other lack of coordination: Secondary | ICD-10-CM | POA: Diagnosis not present

## 2020-12-27 DIAGNOSIS — R2689 Other abnormalities of gait and mobility: Secondary | ICD-10-CM | POA: Diagnosis not present

## 2020-12-27 DIAGNOSIS — M6281 Muscle weakness (generalized): Secondary | ICD-10-CM | POA: Diagnosis not present

## 2020-12-28 DIAGNOSIS — R278 Other lack of coordination: Secondary | ICD-10-CM | POA: Diagnosis not present

## 2020-12-29 DIAGNOSIS — M6281 Muscle weakness (generalized): Secondary | ICD-10-CM | POA: Diagnosis not present

## 2020-12-29 DIAGNOSIS — R278 Other lack of coordination: Secondary | ICD-10-CM | POA: Diagnosis not present

## 2020-12-29 DIAGNOSIS — R2689 Other abnormalities of gait and mobility: Secondary | ICD-10-CM | POA: Diagnosis not present

## 2020-12-30 DIAGNOSIS — E785 Hyperlipidemia, unspecified: Secondary | ICD-10-CM | POA: Diagnosis not present

## 2020-12-30 DIAGNOSIS — R278 Other lack of coordination: Secondary | ICD-10-CM | POA: Diagnosis not present

## 2020-12-30 DIAGNOSIS — G9520 Unspecified cord compression: Secondary | ICD-10-CM | POA: Diagnosis not present

## 2020-12-30 DIAGNOSIS — N401 Enlarged prostate with lower urinary tract symptoms: Secondary | ICD-10-CM | POA: Diagnosis not present

## 2020-12-30 DIAGNOSIS — I5032 Chronic diastolic (congestive) heart failure: Secondary | ICD-10-CM | POA: Diagnosis not present

## 2020-12-30 DIAGNOSIS — E1142 Type 2 diabetes mellitus with diabetic polyneuropathy: Secondary | ICD-10-CM | POA: Diagnosis not present

## 2020-12-30 DIAGNOSIS — N182 Chronic kidney disease, stage 2 (mild): Secondary | ICD-10-CM | POA: Diagnosis not present

## 2020-12-30 DIAGNOSIS — I10 Essential (primary) hypertension: Secondary | ICD-10-CM | POA: Diagnosis not present

## 2020-12-30 DIAGNOSIS — N138 Other obstructive and reflux uropathy: Secondary | ICD-10-CM | POA: Diagnosis not present

## 2020-12-30 DIAGNOSIS — G629 Polyneuropathy, unspecified: Secondary | ICD-10-CM | POA: Diagnosis not present

## 2020-12-31 DIAGNOSIS — G8929 Other chronic pain: Secondary | ICD-10-CM | POA: Diagnosis not present

## 2020-12-31 DIAGNOSIS — F321 Major depressive disorder, single episode, moderate: Secondary | ICD-10-CM | POA: Diagnosis not present

## 2021-01-04 DIAGNOSIS — R278 Other lack of coordination: Secondary | ICD-10-CM | POA: Diagnosis not present

## 2021-01-04 DIAGNOSIS — M6281 Muscle weakness (generalized): Secondary | ICD-10-CM | POA: Diagnosis not present

## 2021-01-04 DIAGNOSIS — R2689 Other abnormalities of gait and mobility: Secondary | ICD-10-CM | POA: Diagnosis not present

## 2021-01-05 DIAGNOSIS — R278 Other lack of coordination: Secondary | ICD-10-CM | POA: Diagnosis not present

## 2021-01-06 DIAGNOSIS — R278 Other lack of coordination: Secondary | ICD-10-CM | POA: Diagnosis not present

## 2021-01-06 DIAGNOSIS — M6281 Muscle weakness (generalized): Secondary | ICD-10-CM | POA: Diagnosis not present

## 2021-01-06 DIAGNOSIS — R2689 Other abnormalities of gait and mobility: Secondary | ICD-10-CM | POA: Diagnosis not present

## 2021-01-10 DIAGNOSIS — R296 Repeated falls: Secondary | ICD-10-CM | POA: Diagnosis not present

## 2021-01-10 DIAGNOSIS — M6281 Muscle weakness (generalized): Secondary | ICD-10-CM | POA: Diagnosis not present

## 2021-01-10 DIAGNOSIS — R2689 Other abnormalities of gait and mobility: Secondary | ICD-10-CM | POA: Diagnosis not present

## 2021-01-11 DIAGNOSIS — I10 Essential (primary) hypertension: Secondary | ICD-10-CM | POA: Diagnosis not present

## 2021-01-11 DIAGNOSIS — E785 Hyperlipidemia, unspecified: Secondary | ICD-10-CM | POA: Diagnosis not present

## 2021-01-11 DIAGNOSIS — E038 Other specified hypothyroidism: Secondary | ICD-10-CM | POA: Diagnosis not present

## 2021-01-11 DIAGNOSIS — E559 Vitamin D deficiency, unspecified: Secondary | ICD-10-CM | POA: Diagnosis not present

## 2021-01-11 DIAGNOSIS — E1142 Type 2 diabetes mellitus with diabetic polyneuropathy: Secondary | ICD-10-CM | POA: Diagnosis not present

## 2021-01-11 DIAGNOSIS — I5032 Chronic diastolic (congestive) heart failure: Secondary | ICD-10-CM | POA: Diagnosis not present

## 2021-01-11 DIAGNOSIS — E119 Type 2 diabetes mellitus without complications: Secondary | ICD-10-CM | POA: Diagnosis not present

## 2021-01-11 DIAGNOSIS — N182 Chronic kidney disease, stage 2 (mild): Secondary | ICD-10-CM | POA: Diagnosis not present

## 2021-01-12 DIAGNOSIS — R296 Repeated falls: Secondary | ICD-10-CM | POA: Diagnosis not present

## 2021-01-12 DIAGNOSIS — R2689 Other abnormalities of gait and mobility: Secondary | ICD-10-CM | POA: Diagnosis not present

## 2021-01-12 DIAGNOSIS — M6281 Muscle weakness (generalized): Secondary | ICD-10-CM | POA: Diagnosis not present

## 2021-01-13 DIAGNOSIS — R278 Other lack of coordination: Secondary | ICD-10-CM | POA: Diagnosis not present

## 2021-01-14 DIAGNOSIS — R278 Other lack of coordination: Secondary | ICD-10-CM | POA: Diagnosis not present

## 2021-01-17 DIAGNOSIS — R2689 Other abnormalities of gait and mobility: Secondary | ICD-10-CM | POA: Diagnosis not present

## 2021-01-17 DIAGNOSIS — R296 Repeated falls: Secondary | ICD-10-CM | POA: Diagnosis not present

## 2021-01-17 DIAGNOSIS — M6281 Muscle weakness (generalized): Secondary | ICD-10-CM | POA: Diagnosis not present

## 2021-01-19 DIAGNOSIS — R2689 Other abnormalities of gait and mobility: Secondary | ICD-10-CM | POA: Diagnosis not present

## 2021-01-19 DIAGNOSIS — M6281 Muscle weakness (generalized): Secondary | ICD-10-CM | POA: Diagnosis not present

## 2021-01-19 DIAGNOSIS — R296 Repeated falls: Secondary | ICD-10-CM | POA: Diagnosis not present

## 2021-01-20 DIAGNOSIS — F4321 Adjustment disorder with depressed mood: Secondary | ICD-10-CM | POA: Diagnosis not present

## 2021-01-24 DIAGNOSIS — R2689 Other abnormalities of gait and mobility: Secondary | ICD-10-CM | POA: Diagnosis not present

## 2021-01-24 DIAGNOSIS — R296 Repeated falls: Secondary | ICD-10-CM | POA: Diagnosis not present

## 2021-01-24 DIAGNOSIS — M6281 Muscle weakness (generalized): Secondary | ICD-10-CM | POA: Diagnosis not present

## 2021-01-26 DIAGNOSIS — G8929 Other chronic pain: Secondary | ICD-10-CM | POA: Diagnosis not present

## 2021-01-26 DIAGNOSIS — F321 Major depressive disorder, single episode, moderate: Secondary | ICD-10-CM | POA: Diagnosis not present

## 2021-01-26 DIAGNOSIS — H2513 Age-related nuclear cataract, bilateral: Secondary | ICD-10-CM | POA: Diagnosis not present

## 2021-01-26 DIAGNOSIS — E11319 Type 2 diabetes mellitus with unspecified diabetic retinopathy without macular edema: Secondary | ICD-10-CM | POA: Diagnosis not present

## 2021-01-28 DIAGNOSIS — M6281 Muscle weakness (generalized): Secondary | ICD-10-CM | POA: Diagnosis not present

## 2021-01-28 DIAGNOSIS — R2689 Other abnormalities of gait and mobility: Secondary | ICD-10-CM | POA: Diagnosis not present

## 2021-01-28 DIAGNOSIS — R296 Repeated falls: Secondary | ICD-10-CM | POA: Diagnosis not present

## 2021-02-03 DIAGNOSIS — B351 Tinea unguium: Secondary | ICD-10-CM | POA: Diagnosis not present

## 2021-02-03 DIAGNOSIS — M79674 Pain in right toe(s): Secondary | ICD-10-CM | POA: Diagnosis not present

## 2021-02-03 DIAGNOSIS — M79675 Pain in left toe(s): Secondary | ICD-10-CM | POA: Diagnosis not present

## 2021-02-04 DIAGNOSIS — Z7984 Long term (current) use of oral hypoglycemic drugs: Secondary | ICD-10-CM | POA: Diagnosis not present

## 2021-02-04 DIAGNOSIS — E7800 Pure hypercholesterolemia, unspecified: Secondary | ICD-10-CM | POA: Diagnosis not present

## 2021-02-04 DIAGNOSIS — M4800 Spinal stenosis, site unspecified: Secondary | ICD-10-CM | POA: Diagnosis not present

## 2021-02-04 DIAGNOSIS — Z6833 Body mass index (BMI) 33.0-33.9, adult: Secondary | ICD-10-CM | POA: Diagnosis not present

## 2021-02-04 DIAGNOSIS — N182 Chronic kidney disease, stage 2 (mild): Secondary | ICD-10-CM | POA: Diagnosis not present

## 2021-02-04 DIAGNOSIS — N400 Enlarged prostate without lower urinary tract symptoms: Secondary | ICD-10-CM | POA: Diagnosis not present

## 2021-02-04 DIAGNOSIS — I130 Hypertensive heart and chronic kidney disease with heart failure and stage 1 through stage 4 chronic kidney disease, or unspecified chronic kidney disease: Secondary | ICD-10-CM | POA: Diagnosis not present

## 2021-02-04 DIAGNOSIS — E1122 Type 2 diabetes mellitus with diabetic chronic kidney disease: Secondary | ICD-10-CM | POA: Diagnosis not present

## 2021-02-04 DIAGNOSIS — L732 Hidradenitis suppurativa: Secondary | ICD-10-CM | POA: Diagnosis not present

## 2021-02-04 DIAGNOSIS — G9520 Unspecified cord compression: Secondary | ICD-10-CM | POA: Diagnosis not present

## 2021-02-04 DIAGNOSIS — E1142 Type 2 diabetes mellitus with diabetic polyneuropathy: Secondary | ICD-10-CM | POA: Diagnosis not present

## 2021-02-04 DIAGNOSIS — I5032 Chronic diastolic (congestive) heart failure: Secondary | ICD-10-CM | POA: Diagnosis not present

## 2021-02-07 DIAGNOSIS — I130 Hypertensive heart and chronic kidney disease with heart failure and stage 1 through stage 4 chronic kidney disease, or unspecified chronic kidney disease: Secondary | ICD-10-CM | POA: Diagnosis not present

## 2021-02-07 DIAGNOSIS — E785 Hyperlipidemia, unspecified: Secondary | ICD-10-CM | POA: Diagnosis not present

## 2021-02-07 DIAGNOSIS — E119 Type 2 diabetes mellitus without complications: Secondary | ICD-10-CM | POA: Diagnosis not present

## 2021-02-07 DIAGNOSIS — E559 Vitamin D deficiency, unspecified: Secondary | ICD-10-CM | POA: Diagnosis not present

## 2021-02-07 DIAGNOSIS — M4800 Spinal stenosis, site unspecified: Secondary | ICD-10-CM | POA: Diagnosis not present

## 2021-02-07 DIAGNOSIS — I10 Essential (primary) hypertension: Secondary | ICD-10-CM | POA: Diagnosis not present

## 2021-02-07 DIAGNOSIS — E038 Other specified hypothyroidism: Secondary | ICD-10-CM | POA: Diagnosis not present

## 2021-02-07 DIAGNOSIS — I5032 Chronic diastolic (congestive) heart failure: Secondary | ICD-10-CM | POA: Diagnosis not present

## 2021-02-07 DIAGNOSIS — N182 Chronic kidney disease, stage 2 (mild): Secondary | ICD-10-CM | POA: Diagnosis not present

## 2021-02-07 DIAGNOSIS — E1122 Type 2 diabetes mellitus with diabetic chronic kidney disease: Secondary | ICD-10-CM | POA: Diagnosis not present

## 2021-02-07 DIAGNOSIS — E1142 Type 2 diabetes mellitus with diabetic polyneuropathy: Secondary | ICD-10-CM | POA: Diagnosis not present

## 2021-02-07 DIAGNOSIS — N400 Enlarged prostate without lower urinary tract symptoms: Secondary | ICD-10-CM | POA: Diagnosis not present

## 2021-02-08 DIAGNOSIS — N182 Chronic kidney disease, stage 2 (mild): Secondary | ICD-10-CM | POA: Diagnosis not present

## 2021-02-08 DIAGNOSIS — M4800 Spinal stenosis, site unspecified: Secondary | ICD-10-CM | POA: Diagnosis not present

## 2021-02-08 DIAGNOSIS — E1122 Type 2 diabetes mellitus with diabetic chronic kidney disease: Secondary | ICD-10-CM | POA: Diagnosis not present

## 2021-02-08 DIAGNOSIS — I130 Hypertensive heart and chronic kidney disease with heart failure and stage 1 through stage 4 chronic kidney disease, or unspecified chronic kidney disease: Secondary | ICD-10-CM | POA: Diagnosis not present

## 2021-02-08 DIAGNOSIS — I5032 Chronic diastolic (congestive) heart failure: Secondary | ICD-10-CM | POA: Diagnosis not present

## 2021-02-08 DIAGNOSIS — N400 Enlarged prostate without lower urinary tract symptoms: Secondary | ICD-10-CM | POA: Diagnosis not present

## 2021-02-16 DIAGNOSIS — I5032 Chronic diastolic (congestive) heart failure: Secondary | ICD-10-CM | POA: Diagnosis not present

## 2021-02-16 DIAGNOSIS — I130 Hypertensive heart and chronic kidney disease with heart failure and stage 1 through stage 4 chronic kidney disease, or unspecified chronic kidney disease: Secondary | ICD-10-CM | POA: Diagnosis not present

## 2021-02-16 DIAGNOSIS — M4800 Spinal stenosis, site unspecified: Secondary | ICD-10-CM | POA: Diagnosis not present

## 2021-02-16 DIAGNOSIS — N400 Enlarged prostate without lower urinary tract symptoms: Secondary | ICD-10-CM | POA: Diagnosis not present

## 2021-02-16 DIAGNOSIS — N182 Chronic kidney disease, stage 2 (mild): Secondary | ICD-10-CM | POA: Diagnosis not present

## 2021-02-16 DIAGNOSIS — E1122 Type 2 diabetes mellitus with diabetic chronic kidney disease: Secondary | ICD-10-CM | POA: Diagnosis not present

## 2021-02-17 DIAGNOSIS — F4321 Adjustment disorder with depressed mood: Secondary | ICD-10-CM | POA: Diagnosis not present

## 2021-02-18 DIAGNOSIS — I130 Hypertensive heart and chronic kidney disease with heart failure and stage 1 through stage 4 chronic kidney disease, or unspecified chronic kidney disease: Secondary | ICD-10-CM | POA: Diagnosis not present

## 2021-02-18 DIAGNOSIS — E1122 Type 2 diabetes mellitus with diabetic chronic kidney disease: Secondary | ICD-10-CM | POA: Diagnosis not present

## 2021-02-18 DIAGNOSIS — N400 Enlarged prostate without lower urinary tract symptoms: Secondary | ICD-10-CM | POA: Diagnosis not present

## 2021-02-18 DIAGNOSIS — N182 Chronic kidney disease, stage 2 (mild): Secondary | ICD-10-CM | POA: Diagnosis not present

## 2021-02-18 DIAGNOSIS — I5032 Chronic diastolic (congestive) heart failure: Secondary | ICD-10-CM | POA: Diagnosis not present

## 2021-02-18 DIAGNOSIS — M4800 Spinal stenosis, site unspecified: Secondary | ICD-10-CM | POA: Diagnosis not present

## 2021-02-21 DIAGNOSIS — M4800 Spinal stenosis, site unspecified: Secondary | ICD-10-CM | POA: Diagnosis not present

## 2021-02-21 DIAGNOSIS — I130 Hypertensive heart and chronic kidney disease with heart failure and stage 1 through stage 4 chronic kidney disease, or unspecified chronic kidney disease: Secondary | ICD-10-CM | POA: Diagnosis not present

## 2021-02-21 DIAGNOSIS — I5032 Chronic diastolic (congestive) heart failure: Secondary | ICD-10-CM | POA: Diagnosis not present

## 2021-02-21 DIAGNOSIS — E1122 Type 2 diabetes mellitus with diabetic chronic kidney disease: Secondary | ICD-10-CM | POA: Diagnosis not present

## 2021-02-21 DIAGNOSIS — N182 Chronic kidney disease, stage 2 (mild): Secondary | ICD-10-CM | POA: Diagnosis not present

## 2021-02-21 DIAGNOSIS — N400 Enlarged prostate without lower urinary tract symptoms: Secondary | ICD-10-CM | POA: Diagnosis not present

## 2021-02-23 DIAGNOSIS — I130 Hypertensive heart and chronic kidney disease with heart failure and stage 1 through stage 4 chronic kidney disease, or unspecified chronic kidney disease: Secondary | ICD-10-CM | POA: Diagnosis not present

## 2021-02-23 DIAGNOSIS — N400 Enlarged prostate without lower urinary tract symptoms: Secondary | ICD-10-CM | POA: Diagnosis not present

## 2021-02-23 DIAGNOSIS — M4800 Spinal stenosis, site unspecified: Secondary | ICD-10-CM | POA: Diagnosis not present

## 2021-02-23 DIAGNOSIS — N182 Chronic kidney disease, stage 2 (mild): Secondary | ICD-10-CM | POA: Diagnosis not present

## 2021-02-23 DIAGNOSIS — E1122 Type 2 diabetes mellitus with diabetic chronic kidney disease: Secondary | ICD-10-CM | POA: Diagnosis not present

## 2021-02-23 DIAGNOSIS — I5032 Chronic diastolic (congestive) heart failure: Secondary | ICD-10-CM | POA: Diagnosis not present

## 2021-02-25 DIAGNOSIS — E785 Hyperlipidemia, unspecified: Secondary | ICD-10-CM | POA: Diagnosis not present

## 2021-02-25 DIAGNOSIS — I5032 Chronic diastolic (congestive) heart failure: Secondary | ICD-10-CM | POA: Diagnosis not present

## 2021-02-25 DIAGNOSIS — I10 Essential (primary) hypertension: Secondary | ICD-10-CM | POA: Diagnosis not present

## 2021-02-25 DIAGNOSIS — L732 Hidradenitis suppurativa: Secondary | ICD-10-CM | POA: Diagnosis not present

## 2021-02-25 DIAGNOSIS — N400 Enlarged prostate without lower urinary tract symptoms: Secondary | ICD-10-CM | POA: Diagnosis not present

## 2021-02-25 DIAGNOSIS — M4802 Spinal stenosis, cervical region: Secondary | ICD-10-CM | POA: Diagnosis not present

## 2021-02-25 DIAGNOSIS — G629 Polyneuropathy, unspecified: Secondary | ICD-10-CM | POA: Diagnosis not present

## 2021-02-25 DIAGNOSIS — E1142 Type 2 diabetes mellitus with diabetic polyneuropathy: Secondary | ICD-10-CM | POA: Diagnosis not present

## 2021-03-02 DIAGNOSIS — M4800 Spinal stenosis, site unspecified: Secondary | ICD-10-CM | POA: Diagnosis not present

## 2021-03-02 DIAGNOSIS — N182 Chronic kidney disease, stage 2 (mild): Secondary | ICD-10-CM | POA: Diagnosis not present

## 2021-03-02 DIAGNOSIS — I5032 Chronic diastolic (congestive) heart failure: Secondary | ICD-10-CM | POA: Diagnosis not present

## 2021-03-02 DIAGNOSIS — E1122 Type 2 diabetes mellitus with diabetic chronic kidney disease: Secondary | ICD-10-CM | POA: Diagnosis not present

## 2021-03-02 DIAGNOSIS — I130 Hypertensive heart and chronic kidney disease with heart failure and stage 1 through stage 4 chronic kidney disease, or unspecified chronic kidney disease: Secondary | ICD-10-CM | POA: Diagnosis not present

## 2021-03-02 DIAGNOSIS — N400 Enlarged prostate without lower urinary tract symptoms: Secondary | ICD-10-CM | POA: Diagnosis not present

## 2021-03-03 DIAGNOSIS — G9520 Unspecified cord compression: Secondary | ICD-10-CM | POA: Diagnosis not present

## 2021-03-03 DIAGNOSIS — E1122 Type 2 diabetes mellitus with diabetic chronic kidney disease: Secondary | ICD-10-CM | POA: Diagnosis not present

## 2021-03-03 DIAGNOSIS — E1142 Type 2 diabetes mellitus with diabetic polyneuropathy: Secondary | ICD-10-CM | POA: Diagnosis not present

## 2021-03-03 DIAGNOSIS — E7800 Pure hypercholesterolemia, unspecified: Secondary | ICD-10-CM | POA: Diagnosis not present

## 2021-03-03 DIAGNOSIS — L732 Hidradenitis suppurativa: Secondary | ICD-10-CM | POA: Diagnosis not present

## 2021-03-03 DIAGNOSIS — I130 Hypertensive heart and chronic kidney disease with heart failure and stage 1 through stage 4 chronic kidney disease, or unspecified chronic kidney disease: Secondary | ICD-10-CM | POA: Diagnosis not present

## 2021-03-03 DIAGNOSIS — I5032 Chronic diastolic (congestive) heart failure: Secondary | ICD-10-CM | POA: Diagnosis not present

## 2021-03-04 DIAGNOSIS — E782 Mixed hyperlipidemia: Secondary | ICD-10-CM | POA: Diagnosis not present

## 2021-03-04 DIAGNOSIS — D518 Other vitamin B12 deficiency anemias: Secondary | ICD-10-CM | POA: Diagnosis not present

## 2021-03-04 DIAGNOSIS — N182 Chronic kidney disease, stage 2 (mild): Secondary | ICD-10-CM | POA: Diagnosis not present

## 2021-03-04 DIAGNOSIS — E1142 Type 2 diabetes mellitus with diabetic polyneuropathy: Secondary | ICD-10-CM | POA: Diagnosis not present

## 2021-03-04 DIAGNOSIS — E119 Type 2 diabetes mellitus without complications: Secondary | ICD-10-CM | POA: Diagnosis not present

## 2021-03-04 DIAGNOSIS — E038 Other specified hypothyroidism: Secondary | ICD-10-CM | POA: Diagnosis not present

## 2021-03-04 DIAGNOSIS — I10 Essential (primary) hypertension: Secondary | ICD-10-CM | POA: Diagnosis not present

## 2021-03-04 DIAGNOSIS — E559 Vitamin D deficiency, unspecified: Secondary | ICD-10-CM | POA: Diagnosis not present

## 2021-03-04 DIAGNOSIS — E785 Hyperlipidemia, unspecified: Secondary | ICD-10-CM | POA: Diagnosis not present

## 2021-03-04 DIAGNOSIS — I5032 Chronic diastolic (congestive) heart failure: Secondary | ICD-10-CM | POA: Diagnosis not present

## 2021-03-04 DIAGNOSIS — N400 Enlarged prostate without lower urinary tract symptoms: Secondary | ICD-10-CM | POA: Diagnosis not present

## 2021-03-06 DIAGNOSIS — N182 Chronic kidney disease, stage 2 (mild): Secondary | ICD-10-CM | POA: Diagnosis not present

## 2021-03-06 DIAGNOSIS — I5032 Chronic diastolic (congestive) heart failure: Secondary | ICD-10-CM | POA: Diagnosis not present

## 2021-03-06 DIAGNOSIS — I130 Hypertensive heart and chronic kidney disease with heart failure and stage 1 through stage 4 chronic kidney disease, or unspecified chronic kidney disease: Secondary | ICD-10-CM | POA: Diagnosis not present

## 2021-03-06 DIAGNOSIS — E7800 Pure hypercholesterolemia, unspecified: Secondary | ICD-10-CM | POA: Diagnosis not present

## 2021-03-06 DIAGNOSIS — Z6833 Body mass index (BMI) 33.0-33.9, adult: Secondary | ICD-10-CM | POA: Diagnosis not present

## 2021-03-06 DIAGNOSIS — M4800 Spinal stenosis, site unspecified: Secondary | ICD-10-CM | POA: Diagnosis not present

## 2021-03-06 DIAGNOSIS — Z7984 Long term (current) use of oral hypoglycemic drugs: Secondary | ICD-10-CM | POA: Diagnosis not present

## 2021-03-06 DIAGNOSIS — L732 Hidradenitis suppurativa: Secondary | ICD-10-CM | POA: Diagnosis not present

## 2021-03-06 DIAGNOSIS — N400 Enlarged prostate without lower urinary tract symptoms: Secondary | ICD-10-CM | POA: Diagnosis not present

## 2021-03-06 DIAGNOSIS — G9520 Unspecified cord compression: Secondary | ICD-10-CM | POA: Diagnosis not present

## 2021-03-06 DIAGNOSIS — E1142 Type 2 diabetes mellitus with diabetic polyneuropathy: Secondary | ICD-10-CM | POA: Diagnosis not present

## 2021-03-06 DIAGNOSIS — E1122 Type 2 diabetes mellitus with diabetic chronic kidney disease: Secondary | ICD-10-CM | POA: Diagnosis not present

## 2021-03-08 DIAGNOSIS — F321 Major depressive disorder, single episode, moderate: Secondary | ICD-10-CM | POA: Diagnosis not present

## 2021-03-08 DIAGNOSIS — G8929 Other chronic pain: Secondary | ICD-10-CM | POA: Diagnosis not present

## 2021-03-10 DIAGNOSIS — E038 Other specified hypothyroidism: Secondary | ICD-10-CM | POA: Diagnosis not present

## 2021-03-10 DIAGNOSIS — R109 Unspecified abdominal pain: Secondary | ICD-10-CM | POA: Diagnosis not present

## 2021-03-10 DIAGNOSIS — K828 Other specified diseases of gallbladder: Secondary | ICD-10-CM | POA: Diagnosis not present

## 2021-03-10 DIAGNOSIS — Q5310 Unspecified undescended testicle, unilateral: Secondary | ICD-10-CM | POA: Diagnosis not present

## 2021-03-10 DIAGNOSIS — F17200 Nicotine dependence, unspecified, uncomplicated: Secondary | ICD-10-CM | POA: Diagnosis not present

## 2021-03-10 DIAGNOSIS — Z79899 Other long term (current) drug therapy: Secondary | ICD-10-CM | POA: Diagnosis not present

## 2021-03-10 DIAGNOSIS — E119 Type 2 diabetes mellitus without complications: Secondary | ICD-10-CM | POA: Diagnosis not present

## 2021-03-10 DIAGNOSIS — K6289 Other specified diseases of anus and rectum: Secondary | ICD-10-CM | POA: Diagnosis not present

## 2021-03-10 DIAGNOSIS — D518 Other vitamin B12 deficiency anemias: Secondary | ICD-10-CM | POA: Diagnosis not present

## 2021-03-10 DIAGNOSIS — K5900 Constipation, unspecified: Secondary | ICD-10-CM | POA: Diagnosis not present

## 2021-03-10 DIAGNOSIS — E559 Vitamin D deficiency, unspecified: Secondary | ICD-10-CM | POA: Diagnosis not present

## 2021-03-10 DIAGNOSIS — I10 Essential (primary) hypertension: Secondary | ICD-10-CM | POA: Diagnosis not present

## 2021-03-10 DIAGNOSIS — K8020 Calculus of gallbladder without cholecystitis without obstruction: Secondary | ICD-10-CM | POA: Diagnosis not present

## 2021-03-10 DIAGNOSIS — I700 Atherosclerosis of aorta: Secondary | ICD-10-CM | POA: Diagnosis not present

## 2021-03-10 DIAGNOSIS — K6389 Other specified diseases of intestine: Secondary | ICD-10-CM | POA: Diagnosis not present

## 2021-03-10 DIAGNOSIS — E782 Mixed hyperlipidemia: Secondary | ICD-10-CM | POA: Diagnosis not present

## 2021-03-11 DIAGNOSIS — R531 Weakness: Secondary | ICD-10-CM | POA: Diagnosis not present

## 2021-03-11 DIAGNOSIS — I10 Essential (primary) hypertension: Secondary | ICD-10-CM | POA: Diagnosis not present

## 2021-03-11 DIAGNOSIS — Z743 Need for continuous supervision: Secondary | ICD-10-CM | POA: Diagnosis not present

## 2021-03-12 DIAGNOSIS — I5032 Chronic diastolic (congestive) heart failure: Secondary | ICD-10-CM | POA: Diagnosis not present

## 2021-03-12 DIAGNOSIS — N400 Enlarged prostate without lower urinary tract symptoms: Secondary | ICD-10-CM | POA: Diagnosis not present

## 2021-03-12 DIAGNOSIS — M4800 Spinal stenosis, site unspecified: Secondary | ICD-10-CM | POA: Diagnosis not present

## 2021-03-12 DIAGNOSIS — I130 Hypertensive heart and chronic kidney disease with heart failure and stage 1 through stage 4 chronic kidney disease, or unspecified chronic kidney disease: Secondary | ICD-10-CM | POA: Diagnosis not present

## 2021-03-12 DIAGNOSIS — E1122 Type 2 diabetes mellitus with diabetic chronic kidney disease: Secondary | ICD-10-CM | POA: Diagnosis not present

## 2021-03-12 DIAGNOSIS — N182 Chronic kidney disease, stage 2 (mild): Secondary | ICD-10-CM | POA: Diagnosis not present

## 2021-03-15 DIAGNOSIS — K5900 Constipation, unspecified: Secondary | ICD-10-CM | POA: Diagnosis not present

## 2021-03-16 DIAGNOSIS — I130 Hypertensive heart and chronic kidney disease with heart failure and stage 1 through stage 4 chronic kidney disease, or unspecified chronic kidney disease: Secondary | ICD-10-CM | POA: Diagnosis not present

## 2021-03-16 DIAGNOSIS — E1122 Type 2 diabetes mellitus with diabetic chronic kidney disease: Secondary | ICD-10-CM | POA: Diagnosis not present

## 2021-03-16 DIAGNOSIS — I5032 Chronic diastolic (congestive) heart failure: Secondary | ICD-10-CM | POA: Diagnosis not present

## 2021-03-16 DIAGNOSIS — N182 Chronic kidney disease, stage 2 (mild): Secondary | ICD-10-CM | POA: Diagnosis not present

## 2021-03-16 DIAGNOSIS — M4800 Spinal stenosis, site unspecified: Secondary | ICD-10-CM | POA: Diagnosis not present

## 2021-03-16 DIAGNOSIS — N400 Enlarged prostate without lower urinary tract symptoms: Secondary | ICD-10-CM | POA: Diagnosis not present

## 2021-03-17 DIAGNOSIS — F4321 Adjustment disorder with depressed mood: Secondary | ICD-10-CM | POA: Diagnosis not present

## 2021-03-21 DIAGNOSIS — L720 Epidermal cyst: Secondary | ICD-10-CM | POA: Diagnosis not present

## 2021-03-21 DIAGNOSIS — L905 Scar conditions and fibrosis of skin: Secondary | ICD-10-CM | POA: Diagnosis not present

## 2021-03-21 DIAGNOSIS — D485 Neoplasm of uncertain behavior of skin: Secondary | ICD-10-CM | POA: Diagnosis not present

## 2021-03-22 DIAGNOSIS — F419 Anxiety disorder, unspecified: Secondary | ICD-10-CM | POA: Diagnosis not present

## 2021-03-22 DIAGNOSIS — F32A Depression, unspecified: Secondary | ICD-10-CM | POA: Diagnosis not present

## 2021-03-23 DIAGNOSIS — Z79899 Other long term (current) drug therapy: Secondary | ICD-10-CM | POA: Diagnosis not present

## 2021-04-04 DIAGNOSIS — E559 Vitamin D deficiency, unspecified: Secondary | ICD-10-CM | POA: Diagnosis not present

## 2021-04-04 DIAGNOSIS — D518 Other vitamin B12 deficiency anemias: Secondary | ICD-10-CM | POA: Diagnosis not present

## 2021-04-04 DIAGNOSIS — I5032 Chronic diastolic (congestive) heart failure: Secondary | ICD-10-CM | POA: Diagnosis not present

## 2021-04-04 DIAGNOSIS — E038 Other specified hypothyroidism: Secondary | ICD-10-CM | POA: Diagnosis not present

## 2021-04-04 DIAGNOSIS — N400 Enlarged prostate without lower urinary tract symptoms: Secondary | ICD-10-CM | POA: Diagnosis not present

## 2021-04-04 DIAGNOSIS — E119 Type 2 diabetes mellitus without complications: Secondary | ICD-10-CM | POA: Diagnosis not present

## 2021-04-04 DIAGNOSIS — E785 Hyperlipidemia, unspecified: Secondary | ICD-10-CM | POA: Diagnosis not present

## 2021-04-04 DIAGNOSIS — I10 Essential (primary) hypertension: Secondary | ICD-10-CM | POA: Diagnosis not present

## 2021-04-04 DIAGNOSIS — N182 Chronic kidney disease, stage 2 (mild): Secondary | ICD-10-CM | POA: Diagnosis not present

## 2021-04-04 DIAGNOSIS — E1142 Type 2 diabetes mellitus with diabetic polyneuropathy: Secondary | ICD-10-CM | POA: Diagnosis not present

## 2021-04-04 DIAGNOSIS — E782 Mixed hyperlipidemia: Secondary | ICD-10-CM | POA: Diagnosis not present

## 2021-04-05 DIAGNOSIS — F419 Anxiety disorder, unspecified: Secondary | ICD-10-CM | POA: Diagnosis not present

## 2021-04-05 DIAGNOSIS — R339 Retention of urine, unspecified: Secondary | ICD-10-CM | POA: Diagnosis not present

## 2021-04-05 DIAGNOSIS — F32A Depression, unspecified: Secondary | ICD-10-CM | POA: Diagnosis not present

## 2021-04-05 DIAGNOSIS — N138 Other obstructive and reflux uropathy: Secondary | ICD-10-CM | POA: Diagnosis not present

## 2021-04-05 DIAGNOSIS — N401 Enlarged prostate with lower urinary tract symptoms: Secondary | ICD-10-CM | POA: Diagnosis not present

## 2021-04-06 DIAGNOSIS — Z20828 Contact with and (suspected) exposure to other viral communicable diseases: Secondary | ICD-10-CM | POA: Diagnosis not present

## 2021-04-06 DIAGNOSIS — J449 Chronic obstructive pulmonary disease, unspecified: Secondary | ICD-10-CM | POA: Diagnosis not present

## 2021-04-06 DIAGNOSIS — U071 COVID-19: Secondary | ICD-10-CM | POA: Diagnosis not present

## 2021-04-07 DIAGNOSIS — L732 Hidradenitis suppurativa: Secondary | ICD-10-CM | POA: Diagnosis not present

## 2021-04-12 DIAGNOSIS — N400 Enlarged prostate without lower urinary tract symptoms: Secondary | ICD-10-CM | POA: Diagnosis not present

## 2021-04-12 DIAGNOSIS — M4802 Spinal stenosis, cervical region: Secondary | ICD-10-CM | POA: Diagnosis not present

## 2021-04-12 DIAGNOSIS — L732 Hidradenitis suppurativa: Secondary | ICD-10-CM | POA: Diagnosis not present

## 2021-04-12 DIAGNOSIS — I10 Essential (primary) hypertension: Secondary | ICD-10-CM | POA: Diagnosis not present

## 2021-04-12 DIAGNOSIS — R6883 Chills (without fever): Secondary | ICD-10-CM | POA: Diagnosis not present

## 2021-04-12 DIAGNOSIS — R531 Weakness: Secondary | ICD-10-CM | POA: Diagnosis not present

## 2021-04-12 DIAGNOSIS — E1142 Type 2 diabetes mellitus with diabetic polyneuropathy: Secondary | ICD-10-CM | POA: Diagnosis not present

## 2021-04-12 DIAGNOSIS — G9520 Unspecified cord compression: Secondary | ICD-10-CM | POA: Diagnosis not present

## 2021-04-12 DIAGNOSIS — G919 Hydrocephalus, unspecified: Secondary | ICD-10-CM | POA: Diagnosis not present

## 2021-04-12 DIAGNOSIS — I5032 Chronic diastolic (congestive) heart failure: Secondary | ICD-10-CM | POA: Diagnosis not present

## 2021-04-12 DIAGNOSIS — G629 Polyneuropathy, unspecified: Secondary | ICD-10-CM | POA: Diagnosis not present

## 2021-04-12 DIAGNOSIS — E785 Hyperlipidemia, unspecified: Secondary | ICD-10-CM | POA: Diagnosis not present

## 2021-04-15 DIAGNOSIS — H25811 Combined forms of age-related cataract, right eye: Secondary | ICD-10-CM | POA: Diagnosis not present

## 2021-04-19 DIAGNOSIS — F419 Anxiety disorder, unspecified: Secondary | ICD-10-CM | POA: Diagnosis not present

## 2021-04-19 DIAGNOSIS — F32A Depression, unspecified: Secondary | ICD-10-CM | POA: Diagnosis not present

## 2021-04-19 DIAGNOSIS — U071 COVID-19: Secondary | ICD-10-CM | POA: Diagnosis not present

## 2021-04-19 DIAGNOSIS — Z20828 Contact with and (suspected) exposure to other viral communicable diseases: Secondary | ICD-10-CM | POA: Diagnosis not present

## 2021-04-20 DIAGNOSIS — Z20828 Contact with and (suspected) exposure to other viral communicable diseases: Secondary | ICD-10-CM | POA: Diagnosis not present

## 2021-04-20 DIAGNOSIS — U071 COVID-19: Secondary | ICD-10-CM | POA: Diagnosis not present

## 2021-04-20 DIAGNOSIS — M79675 Pain in left toe(s): Secondary | ICD-10-CM | POA: Diagnosis not present

## 2021-04-20 DIAGNOSIS — M79674 Pain in right toe(s): Secondary | ICD-10-CM | POA: Diagnosis not present

## 2021-04-20 DIAGNOSIS — B351 Tinea unguium: Secondary | ICD-10-CM | POA: Diagnosis not present

## 2021-04-21 DIAGNOSIS — L732 Hidradenitis suppurativa: Secondary | ICD-10-CM | POA: Diagnosis not present

## 2021-04-29 DIAGNOSIS — H25812 Combined forms of age-related cataract, left eye: Secondary | ICD-10-CM | POA: Diagnosis not present

## 2021-05-06 DIAGNOSIS — D518 Other vitamin B12 deficiency anemias: Secondary | ICD-10-CM | POA: Diagnosis not present

## 2021-05-06 DIAGNOSIS — E785 Hyperlipidemia, unspecified: Secondary | ICD-10-CM | POA: Diagnosis not present

## 2021-05-06 DIAGNOSIS — N182 Chronic kidney disease, stage 2 (mild): Secondary | ICD-10-CM | POA: Diagnosis not present

## 2021-05-06 DIAGNOSIS — I5032 Chronic diastolic (congestive) heart failure: Secondary | ICD-10-CM | POA: Diagnosis not present

## 2021-05-06 DIAGNOSIS — E119 Type 2 diabetes mellitus without complications: Secondary | ICD-10-CM | POA: Diagnosis not present

## 2021-05-06 DIAGNOSIS — N400 Enlarged prostate without lower urinary tract symptoms: Secondary | ICD-10-CM | POA: Diagnosis not present

## 2021-05-06 DIAGNOSIS — E782 Mixed hyperlipidemia: Secondary | ICD-10-CM | POA: Diagnosis not present

## 2021-05-06 DIAGNOSIS — I10 Essential (primary) hypertension: Secondary | ICD-10-CM | POA: Diagnosis not present

## 2021-05-06 DIAGNOSIS — E1142 Type 2 diabetes mellitus with diabetic polyneuropathy: Secondary | ICD-10-CM | POA: Diagnosis not present

## 2021-05-06 DIAGNOSIS — E038 Other specified hypothyroidism: Secondary | ICD-10-CM | POA: Diagnosis not present

## 2021-05-06 DIAGNOSIS — E559 Vitamin D deficiency, unspecified: Secondary | ICD-10-CM | POA: Diagnosis not present

## 2021-05-09 DIAGNOSIS — F32A Depression, unspecified: Secondary | ICD-10-CM | POA: Diagnosis not present

## 2021-05-09 DIAGNOSIS — F419 Anxiety disorder, unspecified: Secondary | ICD-10-CM | POA: Diagnosis not present

## 2021-05-12 DIAGNOSIS — M4802 Spinal stenosis, cervical region: Secondary | ICD-10-CM | POA: Diagnosis not present

## 2021-05-12 DIAGNOSIS — E1142 Type 2 diabetes mellitus with diabetic polyneuropathy: Secondary | ICD-10-CM | POA: Diagnosis not present

## 2021-05-12 DIAGNOSIS — I5032 Chronic diastolic (congestive) heart failure: Secondary | ICD-10-CM | POA: Diagnosis not present

## 2021-05-12 DIAGNOSIS — E559 Vitamin D deficiency, unspecified: Secondary | ICD-10-CM | POA: Diagnosis not present

## 2021-05-12 DIAGNOSIS — E785 Hyperlipidemia, unspecified: Secondary | ICD-10-CM | POA: Diagnosis not present

## 2021-05-12 DIAGNOSIS — I10 Essential (primary) hypertension: Secondary | ICD-10-CM | POA: Diagnosis not present

## 2021-05-12 DIAGNOSIS — N400 Enlarged prostate without lower urinary tract symptoms: Secondary | ICD-10-CM | POA: Diagnosis not present

## 2021-05-13 DIAGNOSIS — Z23 Encounter for immunization: Secondary | ICD-10-CM | POA: Diagnosis not present

## 2021-05-17 DIAGNOSIS — R278 Other lack of coordination: Secondary | ICD-10-CM | POA: Diagnosis not present

## 2021-05-18 DIAGNOSIS — F32A Depression, unspecified: Secondary | ICD-10-CM | POA: Diagnosis not present

## 2021-05-18 DIAGNOSIS — F419 Anxiety disorder, unspecified: Secondary | ICD-10-CM | POA: Diagnosis not present

## 2021-05-19 DIAGNOSIS — R278 Other lack of coordination: Secondary | ICD-10-CM | POA: Diagnosis not present

## 2021-05-19 DIAGNOSIS — J449 Chronic obstructive pulmonary disease, unspecified: Secondary | ICD-10-CM | POA: Diagnosis not present

## 2021-05-24 DIAGNOSIS — R278 Other lack of coordination: Secondary | ICD-10-CM | POA: Diagnosis not present

## 2021-05-24 DIAGNOSIS — F419 Anxiety disorder, unspecified: Secondary | ICD-10-CM | POA: Diagnosis not present

## 2021-05-24 DIAGNOSIS — F32A Depression, unspecified: Secondary | ICD-10-CM | POA: Diagnosis not present

## 2021-05-30 DIAGNOSIS — E119 Type 2 diabetes mellitus without complications: Secondary | ICD-10-CM | POA: Diagnosis not present

## 2021-05-30 DIAGNOSIS — E785 Hyperlipidemia, unspecified: Secondary | ICD-10-CM | POA: Diagnosis not present

## 2021-05-30 DIAGNOSIS — I10 Essential (primary) hypertension: Secondary | ICD-10-CM | POA: Diagnosis not present

## 2021-05-30 DIAGNOSIS — E038 Other specified hypothyroidism: Secondary | ICD-10-CM | POA: Diagnosis not present

## 2021-05-30 DIAGNOSIS — I5032 Chronic diastolic (congestive) heart failure: Secondary | ICD-10-CM | POA: Diagnosis not present

## 2021-05-30 DIAGNOSIS — N182 Chronic kidney disease, stage 2 (mild): Secondary | ICD-10-CM | POA: Diagnosis not present

## 2021-05-30 DIAGNOSIS — E1142 Type 2 diabetes mellitus with diabetic polyneuropathy: Secondary | ICD-10-CM | POA: Diagnosis not present

## 2021-05-30 DIAGNOSIS — N400 Enlarged prostate without lower urinary tract symptoms: Secondary | ICD-10-CM | POA: Diagnosis not present

## 2021-05-30 DIAGNOSIS — E782 Mixed hyperlipidemia: Secondary | ICD-10-CM | POA: Diagnosis not present

## 2021-05-30 DIAGNOSIS — E559 Vitamin D deficiency, unspecified: Secondary | ICD-10-CM | POA: Diagnosis not present

## 2021-05-30 DIAGNOSIS — D518 Other vitamin B12 deficiency anemias: Secondary | ICD-10-CM | POA: Diagnosis not present

## 2021-05-31 DIAGNOSIS — R278 Other lack of coordination: Secondary | ICD-10-CM | POA: Diagnosis not present

## 2021-06-02 DIAGNOSIS — R278 Other lack of coordination: Secondary | ICD-10-CM | POA: Diagnosis not present

## 2021-06-07 DIAGNOSIS — R278 Other lack of coordination: Secondary | ICD-10-CM | POA: Diagnosis not present

## 2021-06-09 DIAGNOSIS — E038 Other specified hypothyroidism: Secondary | ICD-10-CM | POA: Diagnosis not present

## 2021-06-09 DIAGNOSIS — N400 Enlarged prostate without lower urinary tract symptoms: Secondary | ICD-10-CM | POA: Diagnosis not present

## 2021-06-09 DIAGNOSIS — R278 Other lack of coordination: Secondary | ICD-10-CM | POA: Diagnosis not present

## 2021-06-09 DIAGNOSIS — I10 Essential (primary) hypertension: Secondary | ICD-10-CM | POA: Diagnosis not present

## 2021-06-09 DIAGNOSIS — D518 Other vitamin B12 deficiency anemias: Secondary | ICD-10-CM | POA: Diagnosis not present

## 2021-06-09 DIAGNOSIS — N182 Chronic kidney disease, stage 2 (mild): Secondary | ICD-10-CM | POA: Diagnosis not present

## 2021-06-09 DIAGNOSIS — Z79899 Other long term (current) drug therapy: Secondary | ICD-10-CM | POA: Diagnosis not present

## 2021-06-09 DIAGNOSIS — E1142 Type 2 diabetes mellitus with diabetic polyneuropathy: Secondary | ICD-10-CM | POA: Diagnosis not present

## 2021-06-09 DIAGNOSIS — E782 Mixed hyperlipidemia: Secondary | ICD-10-CM | POA: Diagnosis not present

## 2021-06-09 DIAGNOSIS — G629 Polyneuropathy, unspecified: Secondary | ICD-10-CM | POA: Diagnosis not present

## 2021-06-09 DIAGNOSIS — I5032 Chronic diastolic (congestive) heart failure: Secondary | ICD-10-CM | POA: Diagnosis not present

## 2021-06-09 DIAGNOSIS — E559 Vitamin D deficiency, unspecified: Secondary | ICD-10-CM | POA: Diagnosis not present

## 2021-06-09 DIAGNOSIS — E785 Hyperlipidemia, unspecified: Secondary | ICD-10-CM | POA: Diagnosis not present

## 2021-06-09 DIAGNOSIS — E119 Type 2 diabetes mellitus without complications: Secondary | ICD-10-CM | POA: Diagnosis not present

## 2021-06-09 DIAGNOSIS — M4802 Spinal stenosis, cervical region: Secondary | ICD-10-CM | POA: Diagnosis not present

## 2021-06-14 DIAGNOSIS — Z79899 Other long term (current) drug therapy: Secondary | ICD-10-CM | POA: Diagnosis not present

## 2021-06-14 DIAGNOSIS — E7849 Other hyperlipidemia: Secondary | ICD-10-CM | POA: Diagnosis not present

## 2021-06-14 DIAGNOSIS — D518 Other vitamin B12 deficiency anemias: Secondary | ICD-10-CM | POA: Diagnosis not present

## 2021-06-14 DIAGNOSIS — E119 Type 2 diabetes mellitus without complications: Secondary | ICD-10-CM | POA: Diagnosis not present

## 2021-06-21 DIAGNOSIS — I10 Essential (primary) hypertension: Secondary | ICD-10-CM | POA: Diagnosis not present

## 2021-06-29 DIAGNOSIS — F32A Depression, unspecified: Secondary | ICD-10-CM | POA: Diagnosis not present

## 2021-06-29 DIAGNOSIS — F419 Anxiety disorder, unspecified: Secondary | ICD-10-CM | POA: Diagnosis not present

## 2021-06-30 DIAGNOSIS — M79675 Pain in left toe(s): Secondary | ICD-10-CM | POA: Diagnosis not present

## 2021-06-30 DIAGNOSIS — B351 Tinea unguium: Secondary | ICD-10-CM | POA: Diagnosis not present

## 2021-06-30 DIAGNOSIS — M79674 Pain in right toe(s): Secondary | ICD-10-CM | POA: Diagnosis not present

## 2021-07-04 DIAGNOSIS — E559 Vitamin D deficiency, unspecified: Secondary | ICD-10-CM | POA: Diagnosis not present

## 2021-07-04 DIAGNOSIS — D518 Other vitamin B12 deficiency anemias: Secondary | ICD-10-CM | POA: Diagnosis not present

## 2021-07-04 DIAGNOSIS — N182 Chronic kidney disease, stage 2 (mild): Secondary | ICD-10-CM | POA: Diagnosis not present

## 2021-07-04 DIAGNOSIS — N400 Enlarged prostate without lower urinary tract symptoms: Secondary | ICD-10-CM | POA: Diagnosis not present

## 2021-07-04 DIAGNOSIS — E782 Mixed hyperlipidemia: Secondary | ICD-10-CM | POA: Diagnosis not present

## 2021-07-04 DIAGNOSIS — E119 Type 2 diabetes mellitus without complications: Secondary | ICD-10-CM | POA: Diagnosis not present

## 2021-07-04 DIAGNOSIS — E785 Hyperlipidemia, unspecified: Secondary | ICD-10-CM | POA: Diagnosis not present

## 2021-07-04 DIAGNOSIS — E1142 Type 2 diabetes mellitus with diabetic polyneuropathy: Secondary | ICD-10-CM | POA: Diagnosis not present

## 2021-07-04 DIAGNOSIS — I5032 Chronic diastolic (congestive) heart failure: Secondary | ICD-10-CM | POA: Diagnosis not present

## 2021-07-04 DIAGNOSIS — E038 Other specified hypothyroidism: Secondary | ICD-10-CM | POA: Diagnosis not present

## 2021-07-04 DIAGNOSIS — I10 Essential (primary) hypertension: Secondary | ICD-10-CM | POA: Diagnosis not present

## 2021-07-11 DIAGNOSIS — F32A Depression, unspecified: Secondary | ICD-10-CM | POA: Diagnosis not present

## 2021-07-11 DIAGNOSIS — F419 Anxiety disorder, unspecified: Secondary | ICD-10-CM | POA: Diagnosis not present

## 2021-07-21 DIAGNOSIS — G629 Polyneuropathy, unspecified: Secondary | ICD-10-CM | POA: Diagnosis not present

## 2021-07-21 DIAGNOSIS — N182 Chronic kidney disease, stage 2 (mild): Secondary | ICD-10-CM | POA: Diagnosis not present

## 2021-07-21 DIAGNOSIS — E559 Vitamin D deficiency, unspecified: Secondary | ICD-10-CM | POA: Diagnosis not present

## 2021-07-21 DIAGNOSIS — E1142 Type 2 diabetes mellitus with diabetic polyneuropathy: Secondary | ICD-10-CM | POA: Diagnosis not present

## 2021-07-21 DIAGNOSIS — N400 Enlarged prostate without lower urinary tract symptoms: Secondary | ICD-10-CM | POA: Diagnosis not present

## 2021-07-21 DIAGNOSIS — M4802 Spinal stenosis, cervical region: Secondary | ICD-10-CM | POA: Diagnosis not present

## 2021-07-21 DIAGNOSIS — I5032 Chronic diastolic (congestive) heart failure: Secondary | ICD-10-CM | POA: Diagnosis not present

## 2021-07-21 DIAGNOSIS — E785 Hyperlipidemia, unspecified: Secondary | ICD-10-CM | POA: Diagnosis not present

## 2021-07-21 DIAGNOSIS — L732 Hidradenitis suppurativa: Secondary | ICD-10-CM | POA: Diagnosis not present

## 2021-10-31 IMAGING — MR MR HEAD W/O CM
6 of 10 series · 27 of 48 positions shown · non-contrast
Comparison: MRI head 11/08/2019

CLINICAL DATA: Acute neuro deficit.  Left leg weakness

EXAM:
MRI HEAD WITHOUT CONTRAST
TECHNIQUE: Multiplanar, multiecho pulse sequences of the brain and surrounding
structures were obtained without intravenous contrast.

[Series 2: DWI · axial · 3.0mm · 0.94mm/px · z∈[-115,+40]mm · 8 of 106 slices shown (1 of 2)]
[im 1/106]
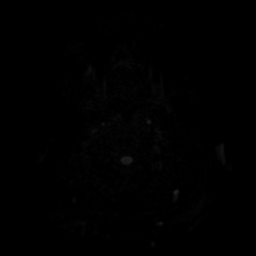
[im 12/106]
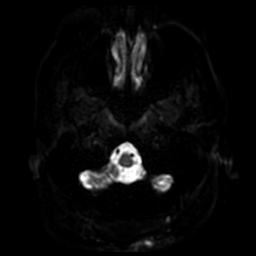
[im 36/106]
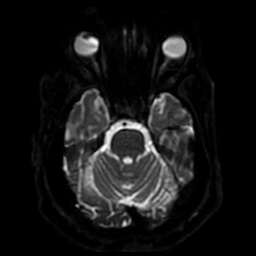
[im 47/106]
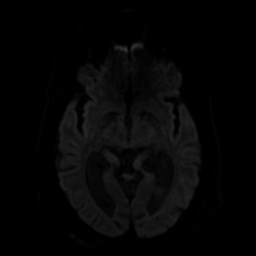
[im 59/106]
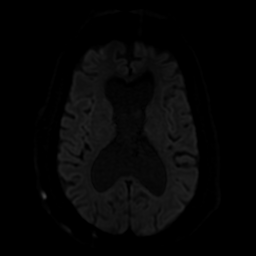
[im 71/106]
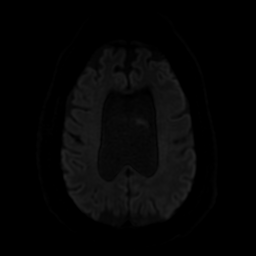
[im 94/106]
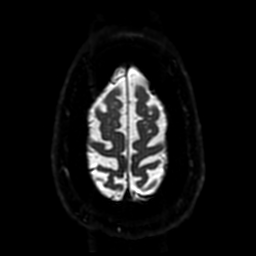
[im 106/106]
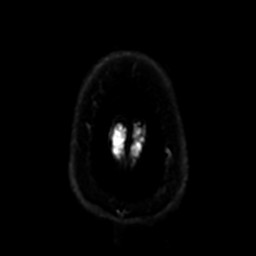

[Series 3: DWI · coronal · 4.0mm · 0.94mm/px · 7 of 74 slices shown (2 of 2)]
[im 1/74]
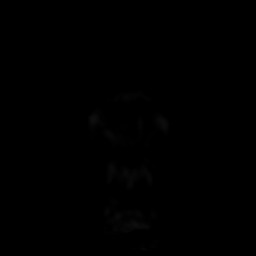
[im 13/74]
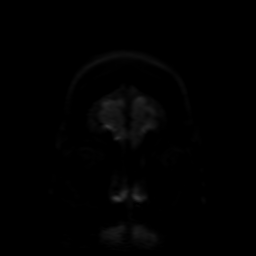
[im 25/74]
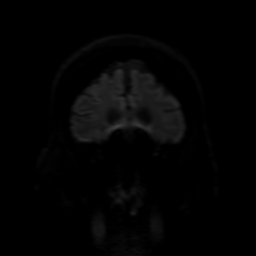
[im 37/74]
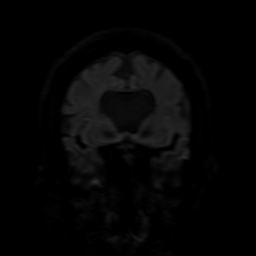
[im 49/74]
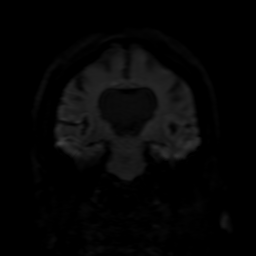
[im 61/74]
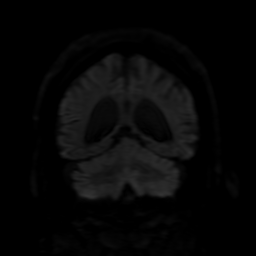
[im 74/74]
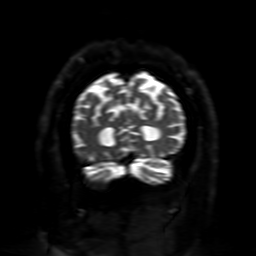

[Series 4: FLAIR · sagittal · 5.0mm · 0.23mm/px · 2 of 25 slices shown (1 of 2)]
[im 1/25]
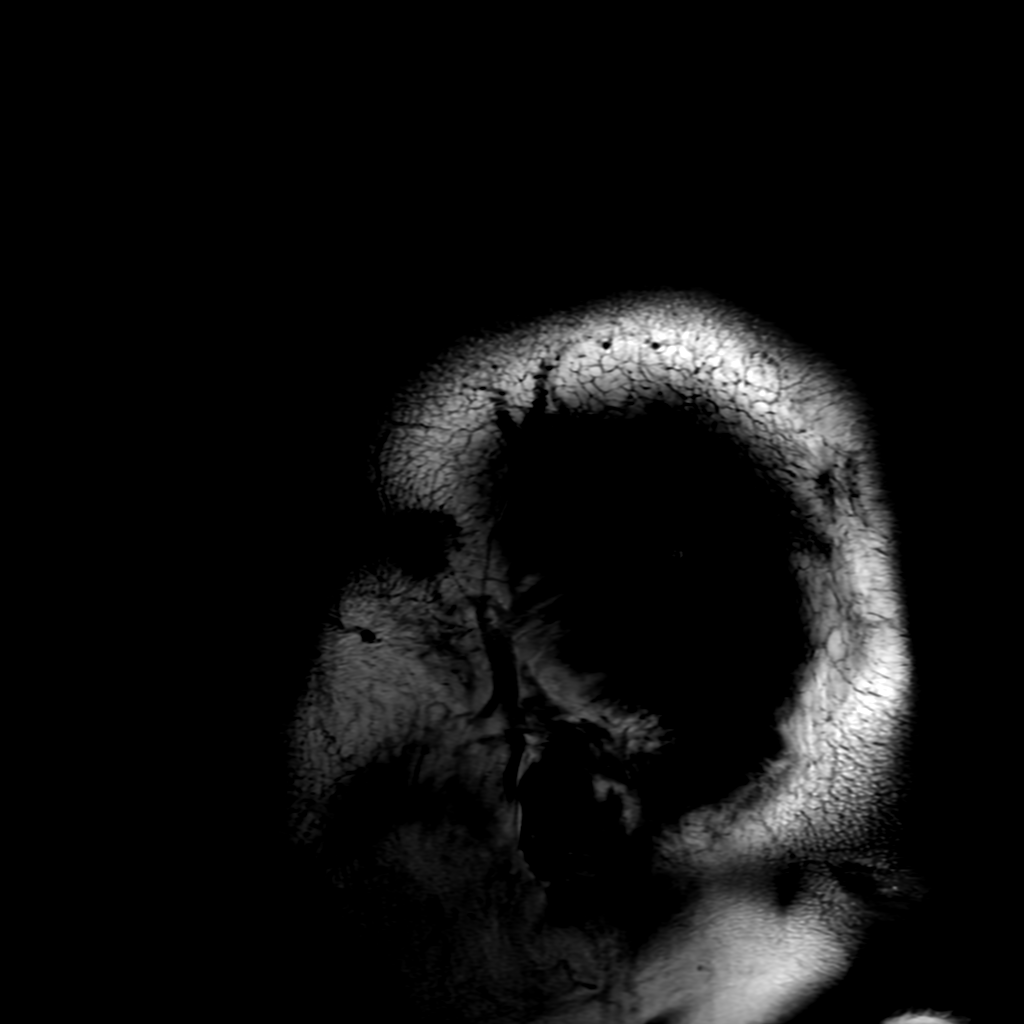
[im 25/25]
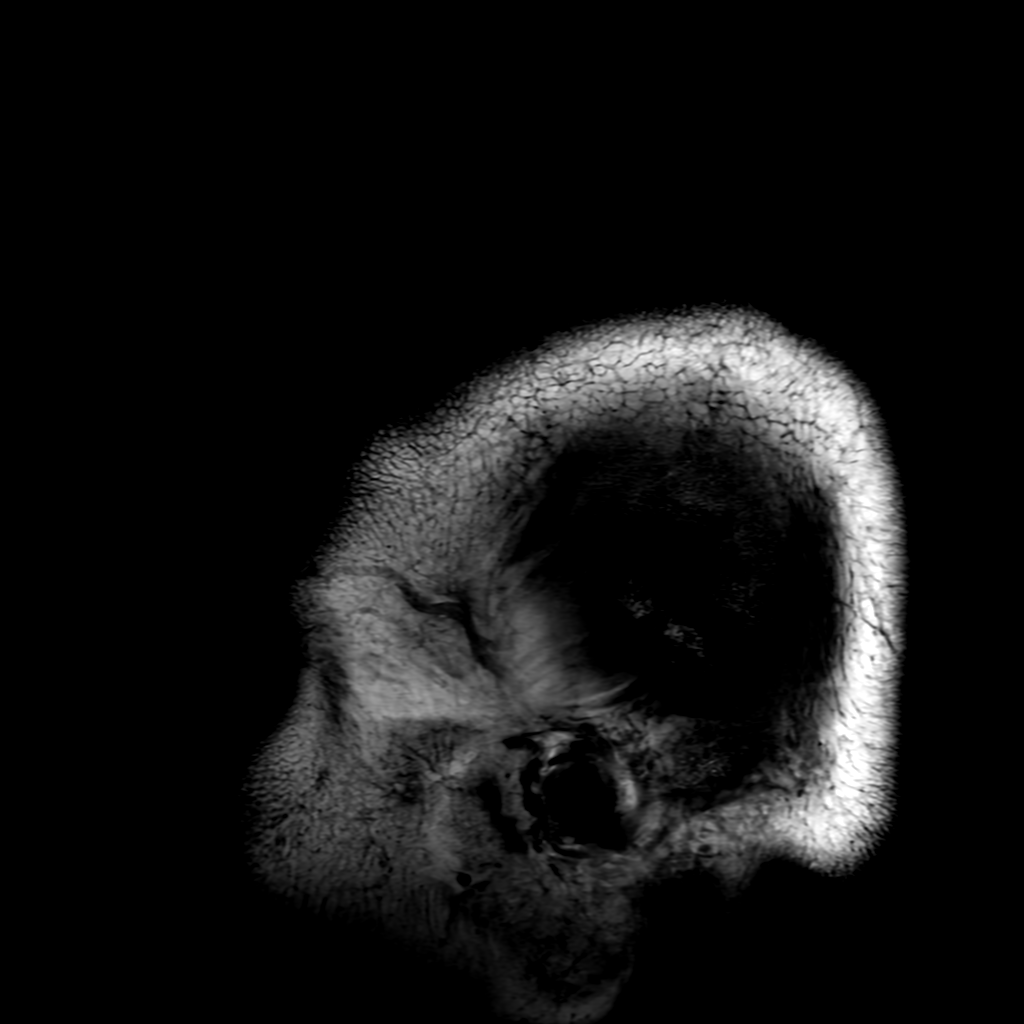

[Series 6: FLAIR · axial · 3.0mm · 0.47mm/px · z∈[-118,+43]mm · 2 of 28 slices shown (2 of 2)]
[im 1/28]
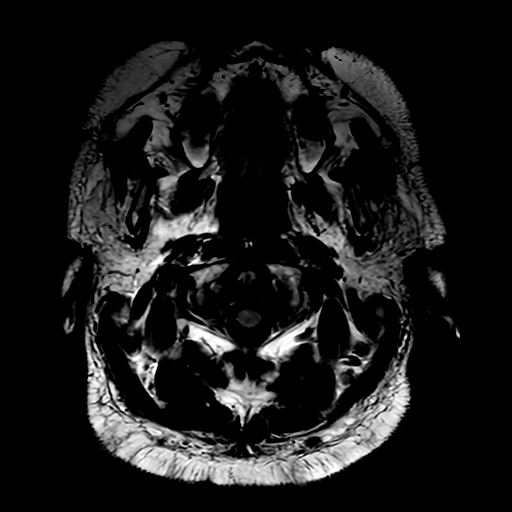
[im 28/28]
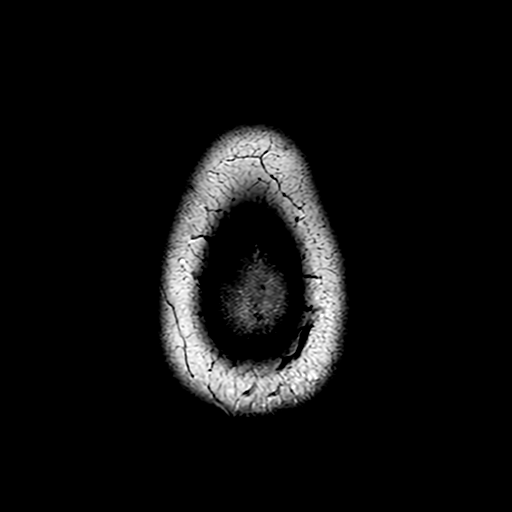

[Series 250: ADC · axial · 3.0mm · 0.94mm/px · z∈[-115,+40]mm · 5 of 53 slices shown (1 of 2)]
[im 1/53]
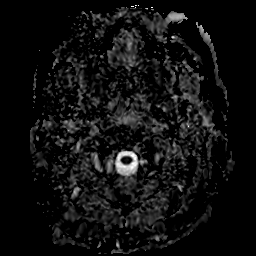
[im 14/53]
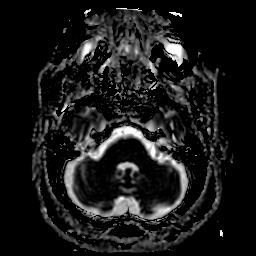
[im 27/53]
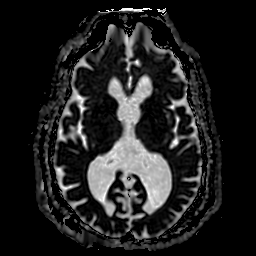
[im 40/53]
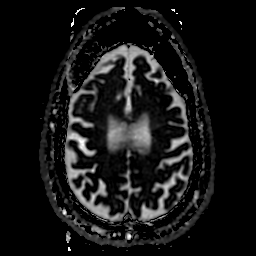
[im 53/53]
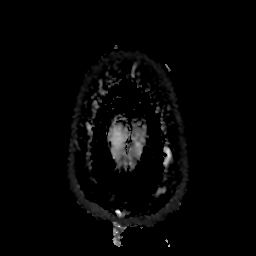

[Series 350: ADC · coronal · 4.0mm · 0.94mm/px · 3 of 37 slices shown (2 of 2)]
[im 1/37]
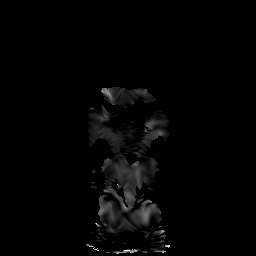
[im 19/37]
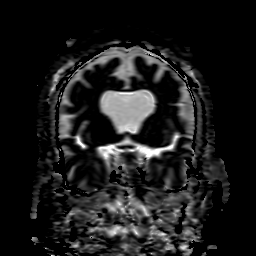
[im 37/37]
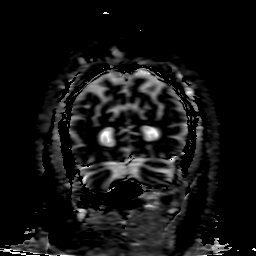

[27 of 48 positions shown; findings below may reference images not displayed]

FINDINGS: Brain: Negative for acute infarct. Few small white matter
hyperintensities are present bilaterally consistent with chronic
microvascular ischemia. Normal brainstem. Negative for hemorrhage or
mass

Generalized atrophy. There is moderate enlargement of the lateral
ventricles bilaterally with rounded contours suggesting obstructive
hydrocephalus. Third and fourth ventricles nondilated. There is
congenital absence of the interventricular septum. The corpus
callosum is thinned due to hydrocephalus. Ventricular enlargement is
chronic and stable including a CT head of 12/17/2015.

Vascular: Normal arterial flow voids.

Skull and upper cervical spine: No focal skeletal lesion.

Sinuses/Orbits: Mild mucosal edema paranasal sinuses.  Normal orbit.

Other: None
IMPRESSION: Negative for acute infarct

Chronically dilated lateral ventricles greater than expected for the
degree of atrophy. This is most likely due to chronic compensated
obstructive hydrocephalus. Note is made of absence of the
interventricular septum.

## 2021-12-02 ENCOUNTER — Telehealth: Payer: Self-pay

## 2021-12-02 NOTE — Telephone Encounter (Signed)
LVM for Patient to return call  RE: Schedule AWV with [facility] with Danessa Mensch CMA  Please transfer patient when if they return the call  

## 2023-03-08 NOTE — Consults (Signed)
 [facility] Neurology  Hospital Consult Note  Referring Physician: Zoaib Safdar Rasool, DO   Chief Complaint/Reason for Consult:  I have been asked to see the patient in neurological consultation to render advice and opinion regarding Possible stroke / TIA.  HPI: [name] is a 62 y.o.  male with a history significant for previous stroke (he is unsure of symptoms at the time), presented with 4-5 hours of left arm and leg weakness and numbness, numbness to the left jaw. Reports some improvement since then but says currently still doesn't feel at baseline on the left side. No chest pain or SOB, no vision changes, no speaking or swallowing problems. CT / CTA head in the ED demonstrated nonocclusive arterial disease, otherwise negative. LDL 44, TSH 2.26. Had echocardiogram in July which showed some hypertrophy and normal LV function, EF 55-60%.   Past Medical History:  Diagnosis Date  . Diabetes mellitus (CMS/HCC)   . Diabetic retinopathy (CMS/HCC)   . High cholesterol   . Hydrocephalus (CMS/HCC)   . Hyperlipidemia associated with type 2 diabetes mellitus (CMS/HCC)    . Peripheral neuropathy     Past Surgical History:  Procedure Laterality Date  . OTHER SURGICAL HISTORY Left    Procedure: OTHER SURGICAL HISTORY (skin graft on left arm)   Allergies  Allergen Reactions  . Amlodipine  Swelling    Lower extremity swelling   Prior to Admission medications   Medication Sig Start Date End Date Taking? Authorizing Provider  aspirin  325 mg tablet Take 325 mg by mouth every morning. 03/13/17  Yes HISTORICAL PROVIDER, [facility]  atorvastatin  (LIPITOR ) 40 mg tablet Take 40 mg by mouth every morning. 11/20/17  Yes HISTORICAL PROVIDER, [facility]  baclofen  (LIORESAL ) 10 mg tablet Take 10 mg by mouth 3 (three) times a day. 06/23/22  Yes HISTORICAL PROVIDER, [facility]  cholecalciferol (VITAMIN D3) 1,000 unit (25 mcg) tablet Take 1,000 Units by mouth every morning. 06/23/22  Yes  HISTORICAL PROVIDER, [facility]  clindamycin  (CLEOCIN  T) 1 % lotion Apply 1 Application topically 2 (two) times a day. 06/23/22  Yes HISTORICAL PROVIDER, [facility]  DULoxetine  (CYMBALTA ) 60 mg capsule Take 60 mg by mouth every morning. 06/23/22  Yes HISTORICAL PROVIDER, [facility]  ferrous sulfate 325 mg (65 mg iron) tablet Take 325 mg by mouth 2 (two) times a day.   Yes HISTORICAL PROVIDER, [facility]  furosemide  (LASIX ) 40 mg tablet Take 40 mg by mouth every morning. 11/20/17  Yes HISTORICAL PROVIDER, [facility]  hydrALAZINE  (APRESOLINE ) 25 mg tablet Take 25 mg by mouth 2 (two) times a day. 06/23/22  Yes HISTORICAL PROVIDER, [facility]  insulin  glargine (Basaglar  KwikPen U-100 Insulin ) 100 unit/mL (3 mL) pen Inject 40 Units under the skin nightly. 06/23/22  Yes HISTORICAL PROVIDER, [facility]  labetaloL  (NORMODYNE ) 100 mg tablet Take 50 mg by mouth 2 (two) times a day. 11/20/17  Yes HISTORICAL PROVIDER, [facility]  lidocaine  (SALONPAS) 4 % patch 1 patch daily as needed. 06/18/22  Yes Merilee Levander Maffucci, MD  magnesium  oxide 400 mg (241 mg magnesium ) tab Take 800 mg by mouth daily.   Yes HISTORICAL PROVIDER, [facility]  metFORMIN  (GLUCOPHAGE ) 500 mg tablet Take 500 mg by mouth 2 (two) times a day with meals. 11/20/17  Yes HISTORICAL PROVIDER, [facility]  tamsulosin (FLOMAX) 0.4 mg cap Take 0.4 mg by mouth every morning. 06/23/22  Yes HISTORICAL PROVIDER, [facility]  sodium bicarbonate 650 mg tablet Take 1,300 mg by mouth 3 (three) times  a day for 30 days. Patient not taking: Reported on 03/07/2023 06/28/22   Zoaib Safdar Rasool, DO   Family History  Problem Relation Name Age of Onset  . Diabetes Mother    . Hypertension Mother    . Cancer Mother    . Hypertension Father    . Heart failure Father    . Stroke Father    . Hypertension Sister          Scheduled Meds: aspirin , 325 mg, oral, QAM atorvastatin , 40 mg, oral, QAM baclofen , 10 mg, oral, TID cefTRIAXone , 1  g, intravenous, Q24H cholecalciferol (VITAMIN D3), 5,000 Units, oral, Daily DULoxetine , 60 mg, oral, QAM enoxaparin , 40 mg, subcutaneous, At Bedtime insulin  glargine, 20 Units, subcutaneous, At Bedtime insulin  aspart (NovoLOG )/insulin  lispro (HumaLOG) injection, 0-10 Units, subcutaneous, With meals & at bedtime   PRN Meds: .  acetaminophen  .  benzocaine-menthol  .  dextrose  .  dextrose  .  diphenhydrAMINE .  guaiFENesin  .  hydrALAZINE  .  ipratropium-albuteroL  .  melatonin .  ondansetron  .  sennosides-docusate sodium  .  sodium chloride    Review of Systems Pertinent items are noted in HPI.   Objective: Temp:  [97.5 F (36.4 C)-98.1 F (36.7 C)] 97.9 F (36.6 C) Heart Rate:  [60-81] 81 Resp:  [12-19] 18 BP: (114-181)/(52-100) 148/52 O2 Device: None (Room air)     Intake/Output Summary (Last 24 hours) at 03/08/2023 1101 Last data filed at 03/08/2023 0847 Gross per 24 hour  Intake 530 ml  Output 700 ml  Net -170 ml    Wt Readings from Last 3 Encounters:  03/08/23 115 kg (253 lb 4.9 oz)  10/20/22 116 kg (256 lb 6.3 oz)      Physical Exam: GENERAL:  No acute distress.  EYES:   Pupils: pupils equally round, reactive to light.  ENT:   Throat: oropharynx clear.  CARDIOVASCULAR:   Palpation/Auscultation: regular rate and rhythm.  RESPIRATORY:   Clear to auscultation bilaterally.  GASTROINTESTINAL:   Abdomen: benign, bowel sounds present.  SKIN:   Inspection: well perfused, no edema.   MENTAL STATUS EXAM:  Orientation: Alert and oriented to person, place and time. Cooperative, follows commands well. Memory: Recent and remote memory normal. Attention, Concentration: Attention span and concentration are normal.  Language: Speech is clear and language is normal.  Fund of knowledge: Aware of current events, vocabulary appropriate for patient age.    CRANIAL NERVES:    CN 2 (Optic): Visual fields intact to confrontation, funduscopic examination without optic disk pallor or  edema, retinal vessels are normal. CN 3,4,6 (EOM): Pupils equal and reactive to light. Full extraocular eye movement without nystagmus.  CN 5 (Trigeminal): Facial sensation is normal, no weakness of masticatory muscles.  CN 7 (Facial): No facial weakness or asymmetry.  CN 8 (Auditory): Auditory acuity grossly normal.  CN 9,10 (Glossophar): The uvula is midline, the palate elevates symmetrically.  CN 11 (spinal access): Normal sternocleidomastoid and trapezius strength.  CN 12 (Hypoglossal): The tongue is midline. No atrophy or fasciculations.    MOTOR: Muscle Strength: Strength - 5/5 and symmetric in the upper and lower extremities, no pronation or drift. Muscle Tone: Tone and muscle bulk are normal in the upper and lower extremities.    REFLEXES: DTRs - 2+ and symmetrical in all four extremities, plantar responses are flexor bilaterally.    COORDINATION: Intact finger-to-nose, heel-to-shin, and rapid alternating movements, no tremor.    SENSATION: Intact to light touch,    NIH Stroke Scale:   NIH Stroke Scale: 0  Labs: Results from last 7 days  Lab Units 03/08/23 0855 03/07/23 1633  WHITE BLOOD CELL COUNT 10*3/uL 7.30 7.56  HEMOGLOBIN g/dL 88.4* 88.6*  HEMATOCRIT % 35.0* 34.6*  PLATELET COUNT 10*3/uL 228 209  MEAN CORPUSCULAR VOLUME fL 82.8 83.5   Results from last 7 days  Lab Units 03/08/23 0855 03/07/23 1633  SODIUM mmol/L 139 137  POTASSIUM mmol/L 6.0* 4.5  CHLORIDE mmol/L 105 104  CO2 mmol/L 31 28  BUN mg/dL 22 20  CREATININE mg/dL 8.76 8.82  CALCIUM  mg/dL 8.9 8.8  MAGNESIUM  mg/dL 2.2 1.8*      Significant Diagnostic Studies: All images independently visualized.  CT Code Stroke W Perfusion  Final Result by Dorn Ozell Roulette, MD (08/14 1641)  CLINICAL DATA:  Left leg weakness.    EXAM:  CT HEAD WITHOUT CONTRAST    CT ANGIOGRAPHY HEAD AND NECK    CT PERFUSION BRAIN    TECHNIQUE:  Multidetector CT imaging of the head and neck was performed using  the  standard protocol during bolus administration of intravenous  contrast. Multiplanar CT image reconstructions and MIPs were  obtained to evaluate the vascular anatomy. Carotid stenosis  measurements (when applicable) are obtained utilizing NASCET  criteria, using the distal internal carotid diameter as the  denominator.    Multiphase CT imaging of the brain was performed following IV bolus  contrast injection. Subsequent parametric perfusion maps were  calculated using RAPID software.    RADIATION DOSE REDUCTION: This exam was performed according to the  departmental dose-optimization program which includes automated  exposure control, adjustment of the mA and/or kV according to  patient size and/or use of iterative reconstruction technique.    CONTRAST:  Dose is not known on this in progress study    COMPARISON:  06/23/2022    FINDINGS:  CT HEAD FINDINGS    Brain: No evidence of acute infarction, hemorrhage, hydrocephalus,  extra-axial collection or mass lesion/mass effect. Advanced  generalized atrophy. There is absence of the septum pellucidum.    Vascular: No hyperdense vessel or unexpected calcification.    Skull: Normal. Negative for fracture or focal lesion. Scattered  scalp scarring with nodular features.    Sinuses/Orbits: Negative    Other: None.    ASPECTS (Alberta Stroke Program Early CT Score)    - Ganglionic level infarction (caudate, lentiform nuclei, internal  capsule, insula, M1-M3 cortex): 7    - Supraganglionic infarction (M4-M6 cortex): 3    Total score (0-10 with 10 being normal): 10    Review of the MIP images confirms the above findings    CTA NECK FINDINGS    Aortic arch: Atheromatous plaque with 2 vessel branching.    Right carotid system: Calcified plaque at the bifurcation without  stenosis or ulceration.    Left carotid system: Mixed density plaque at the bifurcation with  proximal ICA stenosis measuring 40 %. No beading or dissection     Vertebral arteries: Limited at the origins of the vertebral arteries  due to streak artifact. Calcified plaque to a mild degree at the  proximal subclavian arteries. Both vertebral arteries appear  smoothly contoured and diffusely patent.    Skeleton: Extensive cervical decompressive laminectomy and fusion.    Other neck: No acute finding    Upper chest: Negative    Review of the MIP images confirms the above findings    CTA HEAD FINDINGS    Anterior circulation: Confluent atheromatous calcification along the  carotid siphons. The degree of calcified plaque limits  luminal  visualization and measurement. At least mild to moderate left  paraclinoid ICA narrowing. No branch occlusion, beading, or  aneurysm.    Posterior circulation: The vertebral and basilar arteries are  smoothly contoured and diffusely patent. No branch occlusion,  beading, or flow reducing stenosis.    Venous sinuses: Unremarkable    Anatomic variants: None significant    Delayed phase: Not obtained    Review of the MIP images confirms the above findings    CT Brain Perfusion Findings:    CBF (<30%) Volume: 0mL    Perfusion (Tmax>6.0s) volume: 4cc but localizing to the anterior  left frontal lobe but in an area is likely artifactual, especially  in the setting of left leg symptoms.    IMPRESSION:  1. No emergent vascular finding.  2. Atherosclerosis with up to 40% left ICA narrowing at its origin.  Heavy deposition of calcified plaque in the carotid siphons to the  degree that luminal visualization is degraded.  3. Noncontributory CT perfusion.      Electronically Signed    By: Dorn Roulette M.D.    On: 03/07/2023 16:41         Impression:  Left sided paresthesias, stroke being most concerning possibility. Reported prior history of stroke.    Principal Problem:   TIA (transient ischemic attack)   Recommendations: - Telemetry with q4hr neurochecks and vitals - If no other  severe acute comorbid conditions requiring emergency BP reduction (eg, concomitant acute coronary event, acute heart failure, aortic dissection, post-fibrinolysis sICH, or preeclampsia/eclampsia), please allow for permissive hypertension for the first 48-72 hours (up to BP 220/120 mmHg). It is reasonable to lower BP by 15% during the first 24 hours after onset of stroke symptoms.    - Imaging:  - MRI brain w/o contrast or w/ contrast  - Secondary stroke prevention: aspirin  81 mg orally every day - Therapy consults: Physical therapy, Occupational therapy, Speech therapy   Risk stratification - HTN: trend BP and goal to normalize BP by time of discharge - DM: check A1c as above, ACHS FSBS, SSI for coverage        It has been a pleasure to participate in the care of this patient.  Best Regards,

## 2023-08-30 ENCOUNTER — Telehealth: Payer: Self-pay | Admitting: *Deleted

## 2023-08-30 NOTE — Telephone Encounter (Signed)
 Copied from CRM [id number]. Topic: Appointments - Scheduling Inquiry for Clinic >> Aug 29, 2023  2:30 PM Curlee DEL wrote: Reason for CRM: Patient was discharged from [facility] - they were asked to follow up with this office specifically. Please call Makela at his [facility] home @ [phone]. He was advised to follow up Friday or Monday.   Contact Center Agent noted that none of your providers are taking new patients and was not able to schedule.

## 2023-08-30 NOTE — Telephone Encounter (Signed)
 Called pt and left vm

## 2023-09-04 NOTE — Telephone Encounter (Signed)
Left message on voicemail to return call for [facility].

## 2023-12-03 ENCOUNTER — Emergency Department (HOSPITAL_COMMUNITY)
Admission: EM | Admit: 2023-12-03 | Discharge: 2023-12-03 | Disposition: A | Discharge status: Skilled Nursing Facility | Attending: Emergency Medicine | Admitting: Emergency Medicine

## 2023-12-03 ENCOUNTER — Encounter (HOSPITAL_COMMUNITY): Payer: Self-pay | Admitting: Emergency Medicine

## 2023-12-03 DIAGNOSIS — M549 Dorsalgia, unspecified: Secondary | ICD-10-CM | POA: Diagnosis present

## 2023-12-03 DIAGNOSIS — I10 Essential (primary) hypertension: Secondary | ICD-10-CM | POA: Diagnosis not present

## 2023-12-03 DIAGNOSIS — Z7984 Long term (current) use of oral hypoglycemic drugs: Secondary | ICD-10-CM | POA: Diagnosis not present

## 2023-12-03 DIAGNOSIS — Z7901 Long term (current) use of anticoagulants: Secondary | ICD-10-CM | POA: Insufficient documentation

## 2023-12-03 DIAGNOSIS — M62830 Muscle spasm of back: Secondary | ICD-10-CM

## 2023-12-03 DIAGNOSIS — E119 Type 2 diabetes mellitus without complications: Secondary | ICD-10-CM | POA: Diagnosis not present

## 2023-12-03 DIAGNOSIS — Z7982 Long term (current) use of aspirin: Secondary | ICD-10-CM | POA: Insufficient documentation

## 2023-12-03 DIAGNOSIS — Z79899 Other long term (current) drug therapy: Secondary | ICD-10-CM | POA: Insufficient documentation

## 2023-12-03 DIAGNOSIS — Z794 Long term (current) use of insulin: Secondary | ICD-10-CM | POA: Insufficient documentation

## 2023-12-03 LAB — URINALYSIS, ROUTINE W REFLEX MICROSCOPIC
Bilirubin Urine: NEGATIVE
Glucose, UA: NEGATIVE mg/dL
Hgb urine dipstick: NEGATIVE
Ketones, ur: NEGATIVE mg/dL
Nitrite: NEGATIVE
Protein, ur: 30 mg/dL — AB
Specific Gravity, Urine: 1.012 (ref 1.005–1.030)
pH: 5 (ref 5.0–8.0)

## 2023-12-03 LAB — CBC
HCT: 36.1 % — ABNORMAL LOW (ref 39.0–52.0)
Hemoglobin: 11.2 g/dL — ABNORMAL LOW (ref 13.0–17.0)
MCH: 26.9 pg (ref 26.0–34.0)
MCHC: 31 g/dL (ref 30.0–36.0)
MCV: 86.6 fL (ref 80.0–100.0)
Platelets: 281 10*3/uL (ref 150–400)
RBC: 4.17 MIL/uL — ABNORMAL LOW (ref 4.22–5.81)
RDW: 14.3 % (ref 11.5–15.5)
WBC: 10.2 10*3/uL (ref 4.0–10.5)
nRBC: 0 % (ref 0.0–0.2)

## 2023-12-03 LAB — BASIC METABOLIC PANEL WITH GFR
Anion gap: 9 (ref 5–15)
BUN: 23 mg/dL (ref 8–23)
CO2: 25 mmol/L (ref 22–32)
Calcium: 9.2 mg/dL (ref 8.9–10.3)
Chloride: 104 mmol/L (ref 98–111)
Creatinine, Ser: 1.22 mg/dL (ref 0.61–1.24)
GFR, Estimated: >60 mL/min (ref >60)
Glucose, Bld: 115 mg/dL — ABNORMAL HIGH (ref 70–99)
Potassium: 4.7 mmol/L (ref 3.5–5.1)
Sodium: 138 mmol/L (ref 135–145)

## 2023-12-03 MED ORDER — TIZANIDINE HCL 2 MG PO TABS: 2.0000 mg | ORAL_TABLET | Freq: Three times a day (TID) | ORAL | 0 refills | Status: AC | PRN

## 2023-12-03 MED ORDER — NAPROXEN 375 MG PO TABS: 375.0000 mg | ORAL_TABLET | Freq: Two times a day (BID) | ORAL | 0 refills | Status: DC

## 2023-12-03 MED ORDER — ACETAMINOPHEN 325 MG PO TABS
650.0000 mg | ORAL_TABLET | Freq: Once | ORAL | Status: AC
  Administered 2023-12-03: 650 mg via ORAL
  Filled 2023-12-03: qty 2

## 2023-12-03 MED ORDER — LIDOCAINE 4 % EX PTCH: 1.0000 | MEDICATED_PATCH | Freq: Two times a day (BID) | CUTANEOUS | 0 refills | Status: AC

## 2023-12-03 MED ORDER — ACETAMINOPHEN ER 650 MG PO TBCR: 650.0000 mg | EXTENDED_RELEASE_TABLET | Freq: Three times a day (TID) | ORAL | 0 refills | Status: DC | PRN

## 2023-12-03 MED ORDER — TIZANIDINE HCL 2 MG PO TABS: 2.0000 mg | ORAL_TABLET | Freq: Three times a day (TID) | ORAL | 0 refills | Status: DC | PRN

## 2023-12-03 MED ORDER — NAPROXEN 250 MG PO TABS
500.0000 mg | ORAL_TABLET | Freq: Once | ORAL | Status: AC
  Administered 2023-12-03: 500 mg via ORAL
  Filled 2023-12-03: qty 2

## 2023-12-03 MED ORDER — LIDOCAINE 5 % EX PTCH
1.0000 | MEDICATED_PATCH | CUTANEOUS | Status: DC
  Administered 2023-12-03: 1 via TRANSDERMAL
  Filled 2023-12-03: qty 1

## 2023-12-03 MED ORDER — ACETAMINOPHEN ER 650 MG PO TBCR: 650.0000 mg | EXTENDED_RELEASE_TABLET | Freq: Three times a day (TID) | ORAL | 0 refills | Status: AC | PRN

## 2023-12-03 MED ORDER — METHOCARBAMOL 500 MG PO TABS
500.0000 mg | ORAL_TABLET | Freq: Once | ORAL | Status: AC
  Administered 2023-12-03: 500 mg via ORAL
  Filled 2023-12-03: qty 1

## 2023-12-03 MED ORDER — LIDOCAINE 4 % EX PTCH: 1.0000 | MEDICATED_PATCH | Freq: Two times a day (BID) | CUTANEOUS | 0 refills | Status: DC

## 2023-12-03 NOTE — ED Provider Notes (Signed)
 [place] EMERGENCY DEPARTMENT AT [facility] Provider Note   CSN: [id number] Arrival date & time: 12/03/23  1140     History  Chief Complaint  Patient presents with   Back Pain   Flank Pain    [name] is a 63 y.o. male.  HPI     63 year old male comes in with chief complaint of left-sided flank pain and back pain.  Patient has history of hyperlipidemia, diabetes, hypertension, reported history of cocaine use disorder, TIA.  Patient states that he has been having left lower back pain for the last month.  Pain however has been more persistent in the last 3 days.  Pain is still intermittent, worse with certain movements and is described as sharp and tightness type pain.  Pt has no associated numbness, weakness, urinary incontinence, urinary retention, bowel incontinence, pins and needle sensation in the perineal area.  Patient denies any history of abdominal surgery, kidney stones.  He denies any blood in the urine, pain with urination, pain with breathing, cough.  No history of PE, DVT.  Patient has no chest pain or shortness of breath.  Home Medications Prior to Admission medications   Medication Sig Start Date End Date Taking? Authorizing Provider  acetaminophen  (TYLENOL  8 HOUR) 650 MG CR tablet Take 1 tablet (650 mg total) by mouth every 8 (eight) hours as needed for pain or fever. 12/03/23  Yes Deatra Face, MD  lidocaine  4 % Place 1 patch onto the skin 2 (two) times daily. 12/03/23  Yes Deatra Face, MD  naproxen (NAPROSYN) 375 MG tablet Take 1 tablet (375 mg total) by mouth 2 (two) times daily. 12/03/23  Yes Marquavius Scaife, MD  tiZANidine (ZANAFLEX) 2 MG tablet Take 1 tablet (2 mg total) by mouth every 8 (eight) hours as needed for muscle spasms. 12/03/23  Yes Kayler Buckholtz, MD  ASPIR-LOW 81 MG EC tablet TAKE 1 TABLET (81 MG TOTAL) BY MOUTH DAILY. Patient taking differently: Take 81 mg by mouth daily.  01/30/20   Lawrance Presume, MD  atorvastatin   (LIPITOR ) 40 MG tablet Take 1 tablet (40 mg total) by mouth daily. 03/26/20   Angiulli, Everlyn Hockey, PA-C  BD AUTOSHIELD DUO 30G X 5 MM MISC  05/05/20   [provider]  bisacodyl  (DULCOLAX) 10 MG suppository Place 1 suppository (10 mg total) rectally at bedtime. 03/24/20   Angiulli, Everlyn Hockey, PA-C  DULoxetine  (CYMBALTA ) 60 MG capsule TAKE ONE CAPSULE BY MOUTH DAILY FOR LEG AND BACK PAINS Patient taking differently: Take 60 mg by mouth daily.  02/10/20   Lawrance Presume, MD  enoxaparin  (LOVENOX ) 40 MG/0.4ML injection Inject 0.4 mLs (40 mg total) into the skin daily. 03/24/20 06/05/20  Angiulli, Everlyn Hockey, PA-C  furosemide  (LASIX ) 40 MG tablet TAKE ONE TABLET BY MOUTH DAILY . TAKE EXTRA TABLET FOR TWO DAYS AS NEEDED FOR INCREASE LOWER EXTREMITY SWELLING Patient taking differently: Take 40 mg by mouth See admin instructions. TAKE ONE TABLET BY MOUTH DAILY . TAKE EXTRA TABLET FOR TWO DAYS AS NEEDED FOR INCREASE LOWER EXTREMITY SWELLING 02/10/20   Lawrance Presume, MD  hydrALAZINE  (APRESOLINE ) 25 MG tablet Take 1 tablet (25 mg total) by mouth 2 (two) times daily. 03/24/20   Angiulli, Everlyn Hockey, PA-C  insulin  aspart (NOVOLOG ) 100 UNIT/ML injection Inject 6 Units into the skin 3 (three) times daily with meals. 03/24/20   Angiulli, Everlyn Hockey, PA-C  insulin  glargine (LANTUS ) 100 UNIT/ML injection Inject 0.43 mLs (43 Units total) into the skin  daily. 03/25/20   Angiulli, Everlyn Hockey, PA-C  labetalol  (NORMODYNE ) 100 MG tablet Take 0.5 tablets (50 mg total) by mouth 2 (two) times daily. 03/24/20   Angiulli, Everlyn Hockey, PA-C  metFORMIN  (GLUCOPHAGE ) 500 MG tablet Take 500 mg by mouth 2 (two) times daily. 05/05/20   [provider]  senna (SENOKOT) 8.6 MG TABS tablet Take 1 tablet (8.6 mg total) by mouth 2 (two) times daily. 03/24/20   Angiulli, Everlyn Hockey, PA-C      Allergies    Patient has no known allergies.    Review of Systems   Review of Systems  All other systems reviewed and are negative.   Physical  Exam Updated Vital Signs BP (!) 147/65 (BP Location: Right Arm)   Pulse 73   Temp 98 F (36.7 C)   Resp 18   SpO2 99%  Physical Exam Vitals and nursing note reviewed.  Constitutional:      Appearance: He is well-developed.  HENT:     Head: Atraumatic.  Eyes:     Extraocular Movements: Extraocular movements intact.     Pupils: Pupils are equal, round, and reactive to light.  Cardiovascular:     Rate and Rhythm: Normal rate.     Comments: 2+ and equal radial pulse bilaterally Pulmonary:     Effort: Pulmonary effort is normal.  Abdominal:     Tenderness: There is no abdominal tenderness.     Comments: No splenomegaly, no upper quadrant tenderness on the left side or left lower quadrant tenderness  Musculoskeletal:     Cervical back: Neck supple.     Comments: patient has tenderness over the left flank region, with palpation.  Her tenderness is reproduced when patient is trying to pull with his left upper extremity and when he is trying to scoot up from laying down.    Skin:    General: Skin is warm.  Neurological:     Mental Status: He is alert and oriented to person, place, and time.     ED Results / Procedures / Treatments   Labs (all labs ordered are listed, but only abnormal results are displayed) Labs Reviewed  URINALYSIS, ROUTINE W REFLEX MICROSCOPIC - Abnormal; Notable for the following components:      Result Value   Protein, ur 30 (*)    Leukocytes,Ua MODERATE (*)    Bacteria, UA FEW (*)    All other components within normal limits  CBC - Abnormal; Notable for the following components:   RBC 4.17 (*)    Hemoglobin 11.2 (*)    HCT 36.1 (*)    All other components within normal limits  BASIC METABOLIC PANEL WITH GFR - Abnormal; Notable for the following components:   Glucose, Bld 115 (*)    All other components within normal limits    EKG None  Radiology No results found.  Procedures Procedures    Medications Ordered in ED Medications  naproxen  (NAPROSYN) tablet 500 mg (has no administration in time range)  lidocaine  (LIDODERM ) 5 % 1 patch (has no administration in time range)  methocarbamol  (ROBAXIN ) tablet 500 mg (has no administration in time range)  acetaminophen  (TYLENOL ) tablet 650 mg (has no administration in time range)    ED Course/ Medical Decision Making/ A&P                                 Medical Decision Making Amount and/or Complexity of Data  Reviewed Labs: ordered.  Risk OTC drugs. Prescription drug management.   63 year old patient comes in with cc of subacute back pain.  Pain has been present for the last month, its intermittent, but has been more persistent the last 3 days. Patient has pertinent past medical history of prior degenerative spine disease, diabetes, hypertension, hyperlipidemia, documented history of cocaine use disorder. Patient has no history of PE, DVT, kidney stones.  He denies any respiratory symptoms, urinary symptoms or any new neurologic symptoms.  Operable  Differential diagnosis considered for this patient includes: - Degenerative disease of the back - Spondylitises/ spondylosis - Sciatica - Spinal cord compression / cord syndrome - Conus medullaris - Epidural hematoma - Epidural abscess - Lytic/pathologic fracture - Myelitis - Musculoskeletal pain - Kidney stones - Pulmonary embolism - Aortic dissection   I have reviewed patient's previous records including medications recent imaging.  Patient had a CT scan of his spine several years ago   And even MRI of the cervical and thoracic spine.  His CT scan of the lumbar spine, MRI of the cervical and thoracic spine had revealed significant degenerative spine disease.  Based on history and exam, low suspicion for PE, dissection at this time. Low suspicion for intra-abdominal pathology.  Basic labs were ordered in triage.  UA shows no blood, no signs of infection.  CBC, metabolic profile are normal.  Creatinine is normal.  All of  this, further reduces suspicion for vascular pathology.  Given how reproducible the pain is, I think most likely he has musculoskeletal pain.  I do not think x-rays will be helpful.  Low suspicion for lytic lesions/pathologic fracture.  I shared my findings and concerns with the patient.  He is comfortable with medications to manage his symptoms, if they do not get better or get worse then he will return.  If he were to return, perhaps CT scan would be better.   ASSESSMENT:  Muscle spasms, musculoskeletal pain  PLAN: For acute pain, rest, intermittent application of cold packs (later, may switch to heat, but do not sleep on heating pad), analgesics and muscle relaxants are recommended. Discussed longer term treatment plan of prn NSAID's and discussed a home back care exercise program with flexion exercise routine. Proper lifting with avoidance of heavy lifting discussed. Consider PCP followup for Physical Therapy and XRay studies if not improving.  Strict ER return precautions discussed.  Patient will return to the ER if they start developing worsening pain, new neurologic symptoms, urinary incontinence, retention, weakness or numbness.   Final Clinical Impression(s) / ED Diagnoses Final diagnoses:  Muscle spasm of back  Musculoskeletal back pain    Rx / DC Orders ED Discharge Orders          Ordered    acetaminophen  (TYLENOL  8 HOUR) 650 MG CR tablet  Every 8 hours PRN        12/03/23 1632    lidocaine  4 %  2 times daily        12/03/23 1632    tiZANidine (ZANAFLEX) 2 MG tablet  Every 8 hours PRN        12/03/23 1632    naproxen (NAPROSYN) 375 MG tablet  2 times daily        12/03/23 1632              Deatra Face, MD 12/03/23 1645

## 2023-12-03 NOTE — Progress Notes (Signed)
 [facility] Liaison Note                 This patient is enrolled in the Integrated Health Services program of [facility], receiving Home Health Services, HH/ PT/ OT.   If admitted, please reorder appropriate services at discharge.    Hospital Liaison Team will follow for disposition.   Please reach out if there are questions or concerns.   Madelene Schanz, BSN, RN, [facility]  [phone]

## 2023-12-03 NOTE — ED Triage Notes (Signed)
 Lower left back pain x 1 month. Denies injury. Has been taking tylenol  at facility. Hx of kidney disease.   VS CBG 182 153/70 73 pulse 100% ra

## 2023-12-03 NOTE — ED Notes (Signed)
 [facility] called for transport back to facility

## 2023-12-03 NOTE — ED Notes (Signed)
 [facility]

## 2023-12-03 NOTE — Discharge Instructions (Addendum)
 We saw you in the ER for back pain. We suspect that the pain is due to musculoskeletal pain.  Often the pain is self limiting, you just need time and supportive medications such as ibuprofen  every 6-8 hours for the next few days. Take the muscle relaxant as needed, and see your primary care doctor for further management if the symptoms continue to linger.   Please use the back exercises to strengthen the back muscles and be very careful with activities in future to prevent similar painful events.  Please return to the ER if your pain becomes excruciating or you start developing new numbness, weakness, urinary incontinence (peeing on self without warning), urinary retention (not able to pee despite bladder feeling full), inability to defecate, pins and needles sensation by your ano-genital area.

## 2023-12-03 NOTE — ED Provider Triage Note (Signed)
 Emergency Medicine Provider Triage Evaluation Note  [name] , a 63 y.o. male  was evaluated in triage.  Pt complains of atraumatic left flank pain x 1 month worsened this morning. "  Pain is not that bad".  No radicular symptoms.  No history of kidney stones.  Review of Systems  Positive:  Negative:   Physical Exam  BP (!) 147/65 (BP Location: Right Arm)   Pulse 73   Temp 98 F (36.7 C)   Resp 18   SpO2 99%  Gen:   Awake, no distress   Resp:  Normal effort  MSK:   Moves extremities without difficulty, mild left lumbar paraspinal tenderness Other:    Medical Decision Making  Medically screening exam initiated at 12:13 PM.  Appropriate orders placed.  [name] was informed that the remainder of the evaluation will be completed by another provider, this initial triage assessment does not replace that evaluation, and the importance of remaining in the ED until their evaluation is complete.     Felicie Horning, PA-C 12/03/23 1214

## 2024-04-17 ENCOUNTER — Emergency Department (HOSPITAL_COMMUNITY)

## 2024-04-17 ENCOUNTER — Other Ambulatory Visit: Payer: Self-pay

## 2024-04-17 ENCOUNTER — Emergency Department (HOSPITAL_COMMUNITY)
Admission: EM | Admit: 2024-04-17 | Discharge: 2024-04-18 | Disposition: A | Discharge status: Home / Self Care | Attending: Emergency Medicine | Admitting: Emergency Medicine

## 2024-04-17 DIAGNOSIS — R531 Weakness: Secondary | ICD-10-CM | POA: Diagnosis present

## 2024-04-17 DIAGNOSIS — Z7982 Long term (current) use of aspirin: Secondary | ICD-10-CM | POA: Insufficient documentation

## 2024-04-17 DIAGNOSIS — Z794 Long term (current) use of insulin: Secondary | ICD-10-CM | POA: Insufficient documentation

## 2024-04-17 DIAGNOSIS — Z79899 Other long term (current) drug therapy: Secondary | ICD-10-CM | POA: Insufficient documentation

## 2024-04-17 DIAGNOSIS — E1122 Type 2 diabetes mellitus with diabetic chronic kidney disease: Secondary | ICD-10-CM | POA: Insufficient documentation

## 2024-04-17 DIAGNOSIS — N189 Chronic kidney disease, unspecified: Secondary | ICD-10-CM | POA: Insufficient documentation

## 2024-04-17 DIAGNOSIS — Z7984 Long term (current) use of oral hypoglycemic drugs: Secondary | ICD-10-CM | POA: Diagnosis not present

## 2024-04-17 DIAGNOSIS — L989 Disorder of the skin and subcutaneous tissue, unspecified: Secondary | ICD-10-CM | POA: Insufficient documentation

## 2024-04-17 DIAGNOSIS — N390 Urinary tract infection, site not specified: Secondary | ICD-10-CM

## 2024-04-17 DIAGNOSIS — I130 Hypertensive heart and chronic kidney disease with heart failure and stage 1 through stage 4 chronic kidney disease, or unspecified chronic kidney disease: Secondary | ICD-10-CM | POA: Insufficient documentation

## 2024-04-17 DIAGNOSIS — I509 Heart failure, unspecified: Secondary | ICD-10-CM | POA: Diagnosis not present

## 2024-04-17 LAB — COMPREHENSIVE METABOLIC PANEL WITH GFR
ALT: 7 U/L (ref 0–44)
AST: 14 U/L — ABNORMAL LOW (ref 15–41)
Albumin: 3.5 g/dL (ref 3.5–5.0)
Alkaline Phosphatase: 85 U/L (ref 38–126)
Anion gap: 10 (ref 5–15)
BUN: 29 mg/dL — ABNORMAL HIGH (ref 8–23)
CO2: 23 mmol/L (ref 22–32)
Calcium: 9 mg/dL (ref 8.9–10.3)
Chloride: 106 mmol/L (ref 98–111)
Creatinine, Ser: 1.24 mg/dL (ref 0.61–1.24)
GFR, Estimated: >60 mL/min (ref >60)
Glucose, Bld: 148 mg/dL — ABNORMAL HIGH (ref 70–99)
Potassium: 4.8 mmol/L (ref 3.5–5.1)
Sodium: 139 mmol/L (ref 135–145)
Total Bilirubin: 0.3 mg/dL (ref 0.0–1.2)
Total Protein: 6.7 g/dL (ref 6.5–8.1)

## 2024-04-17 LAB — URINALYSIS, W/ REFLEX TO CULTURE (INFECTION SUSPECTED)
Bilirubin Urine: NEGATIVE
Glucose, UA: NEGATIVE mg/dL
Hgb urine dipstick: NEGATIVE
Ketones, ur: NEGATIVE mg/dL
Nitrite: NEGATIVE
Protein, ur: NEGATIVE mg/dL
Specific Gravity, Urine: 1.015 (ref 1.005–1.030)
pH: 5 (ref 5.0–8.0)

## 2024-04-17 LAB — CBC WITH DIFFERENTIAL/PLATELET
Abs Immature Granulocytes: 0.06 K/uL (ref 0.00–0.07)
Basophils Absolute: 0.1 K/uL (ref 0.0–0.1)
Basophils Relative: 1 %
Eosinophils Absolute: 0.3 K/uL (ref 0.0–0.5)
Eosinophils Relative: 3 %
HCT: 32.6 % — ABNORMAL LOW (ref 39.0–52.0)
Hemoglobin: 10.1 g/dL — ABNORMAL LOW (ref 13.0–17.0)
Immature Granulocytes: 1 %
Lymphocytes Relative: 22 %
Lymphs Abs: 2.2 K/uL (ref 0.7–4.0)
MCH: 25.4 pg — ABNORMAL LOW (ref 26.0–34.0)
MCHC: 31 g/dL (ref 30.0–36.0)
MCV: 82.1 fL (ref 80.0–100.0)
Monocytes Absolute: 0.8 K/uL (ref 0.1–1.0)
Monocytes Relative: 8 %
Neutro Abs: 6.7 K/uL (ref 1.7–7.7)
Neutrophils Relative %: 65 %
Platelets: 305 K/uL (ref 150–400)
RBC: 3.97 MIL/uL — ABNORMAL LOW (ref 4.22–5.81)
RDW: 15.2 % (ref 11.5–15.5)
WBC: 10.1 K/uL (ref 4.0–10.5)
nRBC: 0 % (ref 0.0–0.2)

## 2024-04-17 NOTE — ED Triage Notes (Signed)
 Pt BIB [facility] from home. Reports he has been feeling generalized weakness over the past week. EMS called Daughter d/t concern for him staying in bed all day today. Has had suspected fevers. Took 650 po tylenol  at 630 pm. EMS reports pt is being treated for cellulitis of buttocks - has not yet started abx. Pt is unaware of dx when asked.

## 2024-04-17 NOTE — ED Provider Notes (Signed)
  Physical Exam  BP (!) 164/70   Pulse 63   Temp 97.9 F (36.6 C) (Oral)   Resp 19   SpO2 97%   Physical Exam  Procedures  Procedures  ED Course / MDM    Medical Decision Making Amount and/or Complexity of Data Reviewed Labs: ordered. Radiology: ordered. ECG/medicine tests: ordered.   Patient care assumed at shift handoff from previous provider.  See his note for full details.   In short, patient is a 63 year old male with history of CHF, chronic pain, peripheral edema, diabetes, CKD, hypertension.  Patient brought from home via EMS with concerns of increasing generalized weakness.  Patient has no active complaints.  He has been home living with his sister.  He was recently taken home from a rehab facility and since coming home his sister has had concerns about patient laying in bed for the majority of the day.  She is also concerned about a few skin lesions and possible infection.  Plan to follow-up on results of UA and CT of the head.  If negative workup will be grossly reassuring.  Previous provider discussed with patient and his family at bedside that patient could follow-up as an outpatient if these results are negative.  If UA is positive we will treat with antibiotics and have them follow-up.  Head CT shows: 1. No acute intracranial abnormality.  2. Stable atrophy with ventriculomegaly.   UA with many bacteria, moderate leukocytes. Plan to prescribe Keflex . Urine culture sent.   Family states that patient was prescribed an antibiotic for a scalp infection that he has not started taking. They are unsure as to what the antibiotic is.  I will still prescribe Keflex  but have advised them to contact his PCP tomorrow to find out what antibiotic was prescribed and to see if he needs both antibiotics or if they may be able to treat both infection sources with one antibiotic. They voice understanding.   Patient stable at this time for outpatient follow-up. No indication for further  emergent workup or admission. Return precautions provided.    [name] 04/18/24 0031    Trine Raynell Moder, MD 04/18/24 [phone]

## 2024-04-17 NOTE — ED Provider Notes (Signed)
 [place] EMERGENCY DEPARTMENT AT [facility] Provider Note   CSN: [id number] Arrival date & time: 04/17/24  1906     Patient presents with: Weakness   [name] is a 63 y.o. male.   The history is provided by the patient and medical records. No language interpreter was used.  Weakness   63 year old male history of CHF, chronic pain, peripheral edema, diabetes, CKD, hypertension brought here via EMS from home for concern of generalized weakness.  History obtained through patient and through sister who is at bedside.  Per sister, patient stay at a rehab facility for quite a while.  Sister noticed that he has had several skin lesions on his knees, and the back of his scalp and his forehead that is concerning for her.  She worries that these are infection that are left untreated.  Furthermore, patient have been mostly bedbound and complaining of generalized weakness for the past week.  He normally use a wheelchair to move about which she still continues to do so but patient states he just feels tired.  Patient however denies having any severe headache, fever, runny nose sneezing coughing chest pain shortness of breath abdominal pain or urinary symptom.  Sister did bring him home for the past few days and brought him to be seen by a PCP.  She mention PCP started him on some antibiotic for lesions on his scalp but he did endorse having some chills today which concerns her.  Prior to Admission medications   Medication Sig Start Date End Date Taking? Authorizing Provider  acetaminophen  (TYLENOL  8 HOUR) 650 MG CR tablet Take 1 tablet (650 mg total) by mouth every 8 (eight) hours as needed for pain or fever. 12/03/23   Charlyn Sora, MD  ASPIR-LOW 81 MG EC tablet TAKE 1 TABLET (81 MG TOTAL) BY MOUTH DAILY. Patient taking differently: Take 81 mg by mouth daily.  01/30/20   Vicci Barnie NOVAK, MD  atorvastatin  (LIPITOR ) 40 MG tablet Take 1 tablet (40 mg total) by mouth daily. 03/26/20    Angiulli, Toribio JINNY, PA-C  BD AUTOSHIELD DUO 30G X 5 MM MISC  05/05/20   [provider]  bisacodyl  (DULCOLAX) 10 MG suppository Place 1 suppository (10 mg total) rectally at bedtime. 03/24/20   Angiulli, Toribio JINNY, PA-C  DULoxetine  (CYMBALTA ) 60 MG capsule TAKE ONE CAPSULE BY MOUTH DAILY FOR LEG AND BACK PAINS Patient taking differently: Take 60 mg by mouth daily.  02/10/20   Vicci Barnie NOVAK, MD  enoxaparin  (LOVENOX ) 40 MG/0.4ML injection Inject 0.4 mLs (40 mg total) into the skin daily. 03/24/20 06/05/20  Angiulli, Toribio JINNY, PA-C  furosemide  (LASIX ) 40 MG tablet TAKE ONE TABLET BY MOUTH DAILY . TAKE EXTRA TABLET FOR TWO DAYS AS NEEDED FOR INCREASE LOWER EXTREMITY SWELLING Patient taking differently: Take 40 mg by mouth See admin instructions. TAKE ONE TABLET BY MOUTH DAILY . TAKE EXTRA TABLET FOR TWO DAYS AS NEEDED FOR INCREASE LOWER EXTREMITY SWELLING 02/10/20   Vicci Barnie NOVAK, MD  hydrALAZINE  (APRESOLINE ) 25 MG tablet Take 1 tablet (25 mg total) by mouth 2 (two) times daily. 03/24/20   Angiulli, Toribio JINNY, PA-C  insulin  aspart (NOVOLOG ) 100 UNIT/ML injection Inject 6 Units into the skin 3 (three) times daily with meals. 03/24/20   Angiulli, Toribio JINNY, PA-C  insulin  glargine (LANTUS ) 100 UNIT/ML injection Inject 0.43 mLs (43 Units total) into the skin daily. 03/25/20   Angiulli, Toribio JINNY, PA-C  labetalol  (NORMODYNE ) 100 MG tablet Take 0.5 tablets (  50 mg total) by mouth 2 (two) times daily. 03/24/20   Angiulli, Toribio PARAS, PA-C  lidocaine  4 % Place 1 patch onto the skin 2 (two) times daily. 12/03/23   Charlyn Sora, MD  metFORMIN  (GLUCOPHAGE ) 500 MG tablet Take 500 mg by mouth 2 (two) times daily. 05/05/20   [provider]  naproxen  (NAPROSYN ) 375 MG tablet Take 1 tablet (375 mg total) by mouth 2 (two) times daily. 12/03/23   Charlyn Sora, MD  senna (SENOKOT) 8.6 MG TABS tablet Take 1 tablet (8.6 mg total) by mouth 2 (two) times daily. 03/24/20   Angiulli, Toribio PARAS, PA-C  tiZANidine   (ZANAFLEX ) 2 MG tablet Take 1 tablet (2 mg total) by mouth every 8 (eight) hours as needed for muscle spasms. 12/03/23   Charlyn Sora, MD    Allergies: Patient has no known allergies.    Review of Systems  Neurological:  Positive for weakness.  All other systems reviewed and are negative.   Updated Vital Signs BP (!) 108/93 (BP Location: Right Arm)   Pulse 70   Temp 98.5 F (36.9 C) (Oral)   Resp 19   SpO2 99%   Physical Exam Constitutional:      General: He is not in acute distress.    Appearance: He is well-developed.  HENT:     Head: Normocephalic and atraumatic.     Comments: Multiple subcutaneous nodules noted to the occipital scalp mildly tender to palpation without any surrounding erythema and no discharge noted. Eyes:     Conjunctiva/sclera: Conjunctivae normal.  Cardiovascular:     Rate and Rhythm: Normal rate and regular rhythm.     Pulses: Normal pulses.     Heart sounds: Normal heart sounds.  Pulmonary:     Effort: Pulmonary effort is normal.     Breath sounds: Normal breath sounds.  Abdominal:     Palpations: Abdomen is soft.     Tenderness: There is no abdominal tenderness.  Genitourinary:    Comments: Chaperone present during exam.  No cellulitis or abscess noted to gluteal region. Musculoskeletal:     Cervical back: Normal range of motion and neck supple.     Comments: 5 out of 5 strength in all 4 extremities with equal effort.  Skin:    Findings: No rash.  Neurological:     Mental Status: He is alert. Mental status is at baseline.     (all labs ordered are listed, but only abnormal results are displayed) Labs Reviewed  COMPREHENSIVE METABOLIC PANEL WITH GFR - Abnormal; Notable for the following components:      Result Value   Glucose, Bld 148 (*)    BUN 29 (*)    AST 14 (*)    All other components within normal limits  CBC WITH DIFFERENTIAL/PLATELET - Abnormal; Notable for the following components:   RBC 3.97 (*)    Hemoglobin 10.1 (*)     HCT 32.6 (*)    MCH 25.4 (*)    All other components within normal limits  URINALYSIS, W/ REFLEX TO CULTURE (INFECTION SUSPECTED)    EKG: EKG Interpretation Date/Time:  Thursday April 17 2024 22:43:15 EDT Ventricular Rate:  60 PR Interval:    QRS Duration:  169 QT Interval:  478 QTC Calculation: 478 R Axis:   -73  Text Interpretation: AV PACED RHYTHM Confirmed by Franklyn Gills [phone]) on 04/17/2024 11:15:19 PM  Radiology: No results found.   Procedures   Medications Ordered in the ED - No data to display  Medical Decision Making Amount and/or Complexity of Data Reviewed Labs: ordered.   BP (!) 108/93 (BP Location: Right Arm)   Pulse 70   Temp 98.5 F (36.9 C) (Oral)   Resp 19   SpO2 99%   26:73 PM 63 year old male history of CHF, chronic pain, peripheral edema, diabetes, CKD, hypertension brought here via EMS from home for concern of generalized weakness.  History obtained through patient and through sister who is at bedside.  Per sister, patient stay at a rehab facility for quite a while.  Sister noticed that he has had several skin lesions on his knees, and the back of his scalp and his forehead that is concerning for her.  She worries that these are infection that are left untreated.  Furthermore, patient have been mostly bedbound and complaining of generalized weakness for the past week.  He normally use a wheelchair to move about which she still continues to do so but patient states he just feels tired.  Patient however denies having any severe headache, fever, runny nose sneezing coughing chest pain shortness of breath abdominal pain or urinary symptom.  Sister did bring him home for the past few days and brought him to be seen by a PCP.  She mention PCP started him on some antibiotic for lesions on his scalp but he did endorse having some chills today which concerns her.  On exam patient is resting comfortably appears to be in no  acute discomfort.  He does have multiple subcutaneous nodule to his posterior scalp as well as his frontal scalp without any signs of cellulitis.  I inspected his gluteal region I do not appreciate any obvious abscess or cellulitis as initially documented in nursing note.  No other infectious symptoms.  Patient has equal strength throughout.  He is mentating at baseline.  Vital signs show no fever no hypoxia no tachycardia or hypotension.  -Labs ordered, independently viewed and interpreted by me.  Labs remarkable for CBG 148.  Normal WBC, Hgb 10.1.   -The patient was maintained on a cardiac monitor.  I personally viewed and interpreted the cardiac monitored which showed an underlying rhythm of: paced rhythm -Imaging including head CT ordered but have not resulted yet. -This patient presents to the ED for concern of weakness, this involves an extensive number of treatment options, and is a complaint that carries with it a high risk of complications and morbidity.  The differential diagnosis includes dehydration, infection, anemia, stroke, cardiac arrhythmia -Co morbidities that complicate the patient evaluation includes Chf, chronic pain, dm, CKD -Treatment includes monitoring -Reevaluation of the patient after these medicines showed that the patient stayed the same -PCP office notes or outside notes reviewed -Discussion with oncoming team who will f/u in UA and head CT -Escalation to admission/observation considered: dispo pending.       Final diagnoses:  None    ED Discharge Orders     None          Nivia Colon, PA-C 04/17/24 2345    Franklyn Sid SAILOR, MD 04/19/24 [phone]

## 2024-04-18 MED ORDER — CEPHALEXIN 500 MG PO CAPS: 500.0000 mg | ORAL_CAPSULE | Freq: Four times a day (QID) | ORAL | 0 refills | Status: DC

## 2024-04-18 NOTE — Discharge Instructions (Signed)
 Your evaluation this evening revealed a urinary tract infection. A urine culture has been sent for further evaluation. I have prescribed Keflex . Please call your primary care provider tomorrow morning to find out if the antibiotic prescribed for his skin infection can also treat the urinary tract infection. You may not need both antibiotics.  Return to the emergency department if you develop any life threatening symptoms.

## 2024-04-20 LAB — URINE CULTURE: Culture: 20000 — AB

## 2024-04-21 ENCOUNTER — Telehealth (HOSPITAL_BASED_OUTPATIENT_CLINIC_OR_DEPARTMENT_OTHER): Payer: Self-pay | Admitting: *Deleted

## 2024-04-21 NOTE — Telephone Encounter (Signed)
 Post ED Visit - Positive Culture Follow-up: Unsuccessful Patient Follow-up  Culture assessed and recommendations reviewed by:  [x]  Vanna Labi, Pharm.D. []  Venetia Gully, Pharm.D., BCPS AQ-ID []  Garrel Crews, Pharm.D., BCPS []  Almarie Lunger, Pharm.D., BCPS []  [place], [address].D., BCPS, AAHIVP []  Rosaline Bihari, Pharm.D., BCPS, AAHIVP []  Massie Rigg, PharmD []  Jodie Rower, PharmD, BCPS  Positive urine culture reviews by Dr Thom Fetters requested to call patient for symptom check if report urinary symptoms  start Augmentin  875/125 for 5 days and if no symptoms continue keflex .  []  Patient discharged without antimicrobial prescription and treatment is now indicated []  Organism is resistant to prescribed ED discharge antimicrobial []  Patient with positive blood cultures    Unable to contact patient after 3 attempts, letter will be sent to address on file  [name] 04/21/2024, 9:46 AM

## 2024-04-21 NOTE — Progress Notes (Addendum)
 ED Antimicrobial Stewardship Positive Culture Follow Up   [name] is an 63 y.o. male who presented to [facility] on 04/17/2024 with a chief complaint of generalized weakness. Patient does not endorse any urinary symptoms, and therefore low suspicion for UTI.    Chief Complaint  Patient presents with   Weakness    Recent Results (from the past 720 hours)  Urine Culture     Status: Abnormal   Collection Time: 04/17/24 10:45 PM   Specimen: Urine, Random  Result Value Ref Range Status   Specimen Description   Final    URINE, RANDOM Performed at [facility], [address], [place], [place] [address]    Special Requests   Final    NONE Reflexed from [id number] Performed at [facility], [address], [place], [place] [address]    Culture (A)  Final    20,000 COLONIES/mL PROTEUS MIRABILIS >=100,000 COLONIES/mL ENTEROCOCCUS FAECALIS    Report Status 04/20/2024 FINAL  Final   Organism ID, Bacteria PROTEUS MIRABILIS (A)  Final   Organism ID, Bacteria ENTEROCOCCUS FAECALIS (A)  Final      Susceptibility   Enterococcus faecalis - MIC*    AMPICILLIN <=2 SENSITIVE Sensitive     NITROFURANTOIN <=16 SENSITIVE Sensitive     VANCOMYCIN  1 SENSITIVE Sensitive     * >=100,000 COLONIES/mL ENTEROCOCCUS FAECALIS   Proteus mirabilis - MIC*    AMPICILLIN <=2 SENSITIVE Sensitive     CEFAZOLIN  (URINE) Value in next row Sensitive      4 SENSITIVEThis is a modified FDA-approved test that has been validated and its performance characteristics determined by the reporting laboratory.  This laboratory is certified under the Clinical Laboratory Improvement Amendments CLIA as qualified to perform high complexity clinical laboratory testing.    CEFEPIME Value in next row Sensitive      4 SENSITIVEThis is a modified FDA-approved test that has been validated and its performance characteristics determined by the reporting laboratory.  This laboratory is certified under  the Clinical Laboratory Improvement Amendments CLIA as qualified to perform high complexity clinical laboratory testing.    ERTAPENEM Value in next row Sensitive      4 SENSITIVEThis is a modified FDA-approved test that has been validated and its performance characteristics determined by the reporting laboratory.  This laboratory is certified under the Clinical Laboratory Improvement Amendments CLIA as qualified to perform high complexity clinical laboratory testing.    CEFTRIAXONE  Value in next row Sensitive      4 SENSITIVEThis is a modified FDA-approved test that has been validated and its performance characteristics determined by the reporting laboratory.  This laboratory is certified under the Clinical Laboratory Improvement Amendments CLIA as qualified to perform high complexity clinical laboratory testing.    CIPROFLOXACIN Value in next row Sensitive      4 SENSITIVEThis is a modified FDA-approved test that has been validated and its performance characteristics determined by the reporting laboratory.  This laboratory is certified under the Clinical Laboratory Improvement Amendments CLIA as qualified to perform high complexity clinical laboratory testing.    GENTAMICIN Value in next row Sensitive      4 SENSITIVEThis is a modified FDA-approved test that has been validated and its performance characteristics determined by the reporting laboratory.  This laboratory is certified under the Clinical Laboratory Improvement Amendments CLIA as qualified to perform high complexity clinical laboratory testing.    NITROFURANTOIN Value in next row Resistant      4 SENSITIVEThis is  a modified FDA-approved test that has been validated and its performance characteristics determined by the reporting laboratory.  This laboratory is certified under the Clinical Laboratory Improvement Amendments CLIA as qualified to perform high complexity clinical laboratory testing.    TRIMETH /SULFA  Value in next row Sensitive       4 SENSITIVEThis is a modified FDA-approved test that has been validated and its performance characteristics determined by the reporting laboratory.  This laboratory is certified under the Clinical Laboratory Improvement Amendments CLIA as qualified to perform high complexity clinical laboratory testing.    AMPICILLIN/SULBACTAM Value in next row Sensitive      4 SENSITIVEThis is a modified FDA-approved test that has been validated and its performance characteristics determined by the reporting laboratory.  This laboratory is certified under the Clinical Laboratory Improvement Amendments CLIA as qualified to perform high complexity clinical laboratory testing.    PIP/TAZO Value in next row Sensitive      <=4 SENSITIVEThis is a modified FDA-approved test that has been validated and its performance characteristics determined by the reporting laboratory.  This laboratory is certified under the Clinical Laboratory Improvement Amendments CLIA as qualified to perform high complexity clinical laboratory testing.    MEROPENEM Value in next row Sensitive      <=4 SENSITIVEThis is a modified FDA-approved test that has been validated and its performance characteristics determined by the reporting laboratory.  This laboratory is certified under the Clinical Laboratory Improvement Amendments CLIA as qualified to perform high complexity clinical laboratory testing.    * 20,000 COLONIES/mL PROTEUS MIRABILIS     Conduct symptom check: -If patient reports urinary symptoms, start Augmentin  875/125 twice daily for 5 days -If patient does not report urinary symptoms, continue Keflex  for SSTI  ED Provider: Thom Fetters, MD   Dionicia CROME Milta Croson 04/21/2024, 10:16 AM Clinical Pharmacist Monday - Friday phone -  [phone] Saturday - Sunday phone - [phone]

## 2024-04-21 NOTE — Telephone Encounter (Signed)
 Post ED Visit - Positive Culture Follow-up  Culture report reviewed by antimicrobial stewardship pharmacist: [facility] [address], Pharm.D. []  Venetia Gully, Pharm.D., BCPS AQ-ID []  Garrel Crews, Pharm.D., BCPS []  Almarie Lunger, Pharm.D., BCPS []  [place], [place].D., BCPS, AAHIVP []  Rosaline Bihari, Pharm.D., BCPS, AAHIVP []  Vernell Meier, PharmD, BCPS []  Latanya Hint, PharmD, BCPS []  Donald Medley, PharmD, BCPS []  Rocky Bold, PharmD []  Dorothyann Alert, PharmD, BCPS []  Morene Babe, PharmD  [facility] []  Rosaline Edison, PharmD []  Romona Bliss, PharmD []  Dolphus Roller, PharmD []  Veva Seip, Rph []  Vernell Daunt) Leonce, PharmD []  Eva Allis, PharmD []  Rosaline Millet, PharmD []  Iantha Batch, PharmD []  Arvin Gauss, PharmD []  Wanda Hasting, PharmD []  Ronal Rav, PharmD []  Rocky Slade, PharmD []  Bard Jeans, PharmD Spoke to Mr. Derego and sister and patient seem to be impriving on Keflx, no compliants of any urinary symptoms. Patient report improvment in that reguard.Patient encouraged to complete the course of Keflex .   Positive urine culture Treated with Keflex , organism sensitive to the same and no further patient follow-up is required at this time.  Jama Lisle Blondie 04/21/2024, 3:30 PM

## 2024-04-30 ENCOUNTER — Ambulatory Visit: Admitting: Podiatry

## 2024-05-13 ENCOUNTER — Other Ambulatory Visit: Payer: Self-pay

## 2024-05-13 ENCOUNTER — Emergency Department (HOSPITAL_COMMUNITY)
Admission: EM | Admit: 2024-05-13 | Discharge: 2024-05-13 | Disposition: A | Discharge status: Home / Self Care | Attending: Emergency Medicine | Admitting: Emergency Medicine

## 2024-05-13 ENCOUNTER — Emergency Department (HOSPITAL_COMMUNITY)

## 2024-05-13 DIAGNOSIS — Z7982 Long term (current) use of aspirin: Secondary | ICD-10-CM | POA: Diagnosis not present

## 2024-05-13 DIAGNOSIS — Z79899 Other long term (current) drug therapy: Secondary | ICD-10-CM | POA: Diagnosis not present

## 2024-05-13 DIAGNOSIS — Z8673 Personal history of transient ischemic attack (TIA), and cerebral infarction without residual deficits: Secondary | ICD-10-CM | POA: Diagnosis not present

## 2024-05-13 DIAGNOSIS — I5032 Chronic diastolic (congestive) heart failure: Secondary | ICD-10-CM | POA: Diagnosis not present

## 2024-05-13 DIAGNOSIS — Z794 Long term (current) use of insulin: Secondary | ICD-10-CM | POA: Diagnosis not present

## 2024-05-13 DIAGNOSIS — Z7984 Long term (current) use of oral hypoglycemic drugs: Secondary | ICD-10-CM | POA: Diagnosis not present

## 2024-05-13 DIAGNOSIS — R079 Chest pain, unspecified: Secondary | ICD-10-CM | POA: Diagnosis present

## 2024-05-13 DIAGNOSIS — R531 Weakness: Secondary | ICD-10-CM | POA: Diagnosis not present

## 2024-05-13 DIAGNOSIS — E1122 Type 2 diabetes mellitus with diabetic chronic kidney disease: Secondary | ICD-10-CM | POA: Insufficient documentation

## 2024-05-13 DIAGNOSIS — R197 Diarrhea, unspecified: Secondary | ICD-10-CM | POA: Insufficient documentation

## 2024-05-13 DIAGNOSIS — N182 Chronic kidney disease, stage 2 (mild): Secondary | ICD-10-CM | POA: Diagnosis not present

## 2024-05-13 DIAGNOSIS — I130 Hypertensive heart and chronic kidney disease with heart failure and stage 1 through stage 4 chronic kidney disease, or unspecified chronic kidney disease: Secondary | ICD-10-CM | POA: Diagnosis not present

## 2024-05-13 LAB — COMPREHENSIVE METABOLIC PANEL WITH GFR
ALT: 13 U/L (ref 0–44)
AST: 16 U/L (ref 15–41)
Albumin: 3.2 g/dL — ABNORMAL LOW (ref 3.5–5.0)
Alkaline Phosphatase: 66 U/L (ref 38–126)
Anion gap: 9 (ref 5–15)
BUN: 21 mg/dL (ref 8–23)
CO2: 23 mmol/L (ref 22–32)
Calcium: 9 mg/dL (ref 8.9–10.3)
Chloride: 106 mmol/L (ref 98–111)
Creatinine, Ser: 1.26 mg/dL — ABNORMAL HIGH (ref 0.61–1.24)
GFR, Estimated: >60 mL/min (ref >60)
Glucose, Bld: 152 mg/dL — ABNORMAL HIGH (ref 70–99)
Potassium: 4.5 mmol/L (ref 3.5–5.1)
Sodium: 138 mmol/L (ref 135–145)
Total Bilirubin: 0.7 mg/dL (ref 0.0–1.2)
Total Protein: 6.9 g/dL (ref 6.5–8.1)

## 2024-05-13 LAB — CBC WITH DIFFERENTIAL/PLATELET
Abs Immature Granulocytes: 0.04 K/uL (ref 0.00–0.07)
Basophils Absolute: 0.1 K/uL (ref 0.0–0.1)
Basophils Relative: 1 %
Eosinophils Absolute: 0.4 K/uL (ref 0.0–0.5)
Eosinophils Relative: 4 %
HCT: 34.8 % — ABNORMAL LOW (ref 39.0–52.0)
Hemoglobin: 10.9 g/dL — ABNORMAL LOW (ref 13.0–17.0)
Immature Granulocytes: 0 %
Lymphocytes Relative: 19 %
Lymphs Abs: 1.7 K/uL (ref 0.7–4.0)
MCH: 25.8 pg — ABNORMAL LOW (ref 26.0–34.0)
MCHC: 31.3 g/dL (ref 30.0–36.0)
MCV: 82.5 fL (ref 80.0–100.0)
Monocytes Absolute: 0.9 K/uL (ref 0.1–1.0)
Monocytes Relative: 10 %
Neutro Abs: 6 K/uL (ref 1.7–7.7)
Neutrophils Relative %: 66 %
Platelets: 279 K/uL (ref 150–400)
RBC: 4.22 MIL/uL (ref 4.22–5.81)
RDW: 16.6 % — ABNORMAL HIGH (ref 11.5–15.5)
WBC: 9 K/uL (ref 4.0–10.5)
nRBC: 0 % (ref 0.0–0.2)

## 2024-05-13 LAB — RESP PANEL BY RT-PCR (RSV, FLU A&B, COVID)  RVPGX2
Influenza A by PCR: NEGATIVE
Influenza B by PCR: NEGATIVE
Resp Syncytial Virus by PCR: NEGATIVE
SARS Coronavirus 2 by RT PCR: NEGATIVE

## 2024-05-13 LAB — URINALYSIS, W/ REFLEX TO CULTURE (INFECTION SUSPECTED)
Bacteria, UA: NONE SEEN
Bilirubin Urine: NEGATIVE
Glucose, UA: NEGATIVE mg/dL
Hgb urine dipstick: NEGATIVE
Ketones, ur: NEGATIVE mg/dL
Leukocytes,Ua: NEGATIVE
Nitrite: NEGATIVE
Protein, ur: NEGATIVE mg/dL
Specific Gravity, Urine: 1.009 (ref 1.005–1.030)
pH: 7 (ref 5.0–8.0)

## 2024-05-13 LAB — TROPONIN I (HIGH SENSITIVITY)
Troponin I (High Sensitivity): 3 ng/L (ref <18)
Troponin I (High Sensitivity): 3 ng/L (ref <18)

## 2024-05-13 LAB — TYPE AND SCREEN
ABO/RH(D): AB POS
Antibody Screen: NEGATIVE

## 2024-05-13 NOTE — ED Triage Notes (Signed)
 [facility] arriving with patient from home - Lives with sister who is the primary care giver. The patient is wheelchair bound - The patient is complaining of increased weakness over the last 3 days. Sister states he has had dark loose stools starting 2 days ago. Pt also complaining of some chest tightness. AxOx4. On RA.    EMS Vitals and interventions: BP 122/84 HR 70  O2 99 on RA  RR 16 116 CBG EKG NSR Unremarkable - Has pacemaker  NO Meds/IVs

## 2024-05-13 NOTE — ED Notes (Signed)
 Pt sister called for a ride, no answer, LVM.

## 2024-05-13 NOTE — ED Provider Notes (Signed)
.    Received sign out; see prior team's note for full HPI.  Dispo pending repeat troponin and reevaluation.  Patient's repeat troponin negative.  He is chest pain-free currently.  Other labs and vital signs reviewed and reassuring.  Plan was to discharge with cardiology follow-up.  Patient is agreeable to plan.  Will discharge in stable condition.   Neysa Caron PARAS, DO 05/13/24 1726

## 2024-05-13 NOTE — ED Provider Notes (Signed)
 [facility] Provider Note   CSN: [id number] Arrival date & time: 05/13/24  1139     History  Chief Complaint  Patient presents with   Weakness   Chest Pain   Diarrhea    [name] is a 63 y.o. male with PMH as listed below who presents by [facility] arriving with patient from home - Lives with sister who is the primary care giver. The patient is wheelchair bound - The patient is complaining of increased weakness over the last 3 days. Per triage note, sister stated he has had dark loose stools starting 2 days ago. He denies any pain anywhere. Denies cold/flu-like sxs, fevers/chills.  AxOx4.      EMS Vitals and interventions: BP 122/84 HR 70  O2 99 on RA  RR 16 116 CBG EKG NSR Unremarkable - Has pacemaker  NO Meds/IVs.    Past Medical History:  Diagnosis Date   Alcohol abuse    11-15-2017  per pt last alcohol Dec 2018   BPH with obstruction/lower urinary tract symptoms    Chronic arm pain    Chronic pain    arms, leg, back   CKD (chronic kidney disease), stage II    Cocaine abuse (HCC)    11-15-2017  per pt last used March 2019   Congenital hydrocephalus, unspecified (HCC)    slow progression   Diabetic retinopathy of both eyes (HCC)    Diastolic CHF, chronic (HCC) 07/2017   Gait instability    multifactorial -- slow worsening congenital hydrocephalus, peripheral neuropathy, left C5-6 cord lesion   Hematuria    History of acute pulmonary edema 07/2017   History of spinal cord injury 01/02/2014   pt fell, caused spinal cord contusion at C5-6--  residual left side weakness     History of ST elevation myocardial infarction (STEMI) 12/26/2013   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   History of TIA (transient ischemic attack) and stroke    hx cva 12-20-2010 and  TIA 02-22-2011--- no residual's from cva or tia   Hyperlipidemia    Hypertension    followed by pcp   Left-sided weakness 12/2013   chronic due to  spinal cord contusion   Lesion of bladder    Peripheral edema    chronic LUE   Peripheral neuropathy    Type 2 diabetes mellitus (HCC)    followed by dr barnie louder--  last A1c 6.8 on 11-06-2017   Weak urinary stream        Home Medications Prior to Admission medications   Medication Sig Start Date End Date Taking? Authorizing Provider  acetaminophen  (TYLENOL  8 HOUR) 650 MG CR tablet Take 1 tablet (650 mg total) by mouth every 8 (eight) hours as needed for pain or fever. 12/03/23   Charlyn Sora, MD  ASPIR-LOW 81 MG EC tablet TAKE 1 TABLET (81 MG TOTAL) BY MOUTH DAILY. Patient taking differently: Take 81 mg by mouth daily.  01/30/20   Louder Barnie NOVAK, MD  atorvastatin  (LIPITOR ) 40 MG tablet Take 1 tablet (40 mg total) by mouth daily. 03/26/20   Angiulli, Toribio JINNY, PA-C  BD AUTOSHIELD DUO 30G X 5 MM MISC  05/05/20   [provider]  bisacodyl  (DULCOLAX) 10 MG suppository Place 1 suppository (10 mg total) rectally at bedtime. 03/24/20   Angiulli, Toribio JINNY, PA-C  cephALEXin  (KEFLEX ) 500 MG capsule Take 1 capsule (500 mg total) by mouth 4 (four) times daily. 04/18/24   Logan Ubaldo NOVAK,  PA-C  DULoxetine  (CYMBALTA ) 60 MG capsule TAKE ONE CAPSULE BY MOUTH DAILY FOR LEG AND BACK PAINS Patient taking differently: Take 60 mg by mouth daily.  02/10/20   Vicci Barnie NOVAK, MD  enoxaparin  (LOVENOX ) 40 MG/0.4ML injection Inject 0.4 mLs (40 mg total) into the skin daily. 03/24/20 06/05/20  Angiulli, Toribio PARAS, PA-C  furosemide  (LASIX ) 40 MG tablet TAKE ONE TABLET BY MOUTH DAILY . TAKE EXTRA TABLET FOR TWO DAYS AS NEEDED FOR INCREASE LOWER EXTREMITY SWELLING Patient taking differently: Take 40 mg by mouth See admin instructions. TAKE ONE TABLET BY MOUTH DAILY . TAKE EXTRA TABLET FOR TWO DAYS AS NEEDED FOR INCREASE LOWER EXTREMITY SWELLING 02/10/20   Vicci Barnie NOVAK, MD  hydrALAZINE  (APRESOLINE ) 25 MG tablet Take 1 tablet (25 mg total) by mouth 2 (two) times daily. 03/24/20   Angiulli, Toribio PARAS,  PA-C  insulin  aspart (NOVOLOG ) 100 UNIT/ML injection Inject 6 Units into the skin 3 (three) times daily with meals. 03/24/20   Angiulli, Toribio PARAS, PA-C  insulin  glargine (LANTUS ) 100 UNIT/ML injection Inject 0.43 mLs (43 Units total) into the skin daily. 03/25/20   Angiulli, Toribio PARAS, PA-C  labetalol  (NORMODYNE ) 100 MG tablet Take 0.5 tablets (50 mg total) by mouth 2 (two) times daily. 03/24/20   Angiulli, Toribio PARAS, PA-C  lidocaine  4 % Place 1 patch onto the skin 2 (two) times daily. 12/03/23   Charlyn Sora, MD  metFORMIN  (GLUCOPHAGE ) 500 MG tablet Take 500 mg by mouth 2 (two) times daily. 05/05/20   [provider]  naproxen  (NAPROSYN ) 375 MG tablet Take 1 tablet (375 mg total) by mouth 2 (two) times daily. 12/03/23   Charlyn Sora, MD  senna (SENOKOT) 8.6 MG TABS tablet Take 1 tablet (8.6 mg total) by mouth 2 (two) times daily. 03/24/20   Angiulli, Toribio PARAS, PA-C  tiZANidine  (ZANAFLEX ) 2 MG tablet Take 1 tablet (2 mg total) by mouth every 8 (eight) hours as needed for muscle spasms. 12/03/23   Charlyn Sora, MD      Allergies    Patient has no known allergies.    Review of Systems   Review of Systems A 10 point review of systems was performed and is negative unless otherwise reported in HPI.  Physical Exam Updated Vital Signs BP (!) 135/55 (BP Location: Right Arm)   Pulse 66   Temp 98.3 F (36.8 C) (Oral)   Resp 14   SpO2 100%  Physical Exam General: Normal appearing male, lying in bed.  HEENT: PERRLA, Sclera anicteric, MMM, trachea midline.  Cardiology: RRR, no murmurs/rubs/gallops. BL radial and DP pulses equal bilaterally.  Resp: Normal respiratory rate and effort. CTAB, no wheezes, rhonchi, crackles.  Abd: Soft, non-tender, non-distended. No rebound tenderness or guarding.  GU: Deferred. MSK: No peripheral edema or signs of trauma. Extremities without deformity or TTP. No cyanosis or clubbing. Skin: warm, dry. No rashes or lesions. Back: No CVA tenderness Neuro: A&Ox4,  CNs II-XII grossly intact. 5/5 strength all extremities. Sensation grossly intact.   ED Results / Procedures / Treatments   Labs (all labs ordered are listed, but only abnormal results are displayed) Labs Reviewed  CBC WITH DIFFERENTIAL/PLATELET - Abnormal; Notable for the following components:      Result Value   Hemoglobin 10.9 (*)    HCT 34.8 (*)    MCH 25.8 (*)    RDW 16.6 (*)    All other components within normal limits  COMPREHENSIVE METABOLIC PANEL WITH GFR - Abnormal; Notable for the following  components:   Glucose, Bld 152 (*)    Creatinine, Ser 1.26 (*)    Albumin  3.2 (*)    All other components within normal limits  RESP PANEL BY RT-PCR (RSV, FLU A&B, COVID)  [id number]  URINALYSIS, W/ REFLEX TO CULTURE (INFECTION SUSPECTED)  TYPE AND SCREEN  TROPONIN I (HIGH SENSITIVITY)  TROPONIN I (HIGH SENSITIVITY)    EKG EKG Interpretation Date/Time:  Tuesday May 13 2024 12:13:48 EDT Ventricular Rate:  66 PR Interval:  185 QRS Duration:  160 QT Interval:  446 QTC Calculation: 468 R Axis:   -73  Text Interpretation: AV PACED RHYTHM Similar to prior Confirmed by Franklyn Gills [id number]) on 05/13/2024 12:17:51 PM  Radiology CXR: Mild cardiomegaly. No pneumonia or pulmonary edema   Procedures Procedures    Medications Ordered in ED Medications - No data to display  ED Course/ Medical Decision Making/ A&P                          Medical Decision Making Amount and/or Complexity of Data Reviewed Labs: ordered. Decision-making details documented in ED Course. Radiology: ordered.    This patient presents to the ED for concern of gen weakness, chest tightness reported, dark stools reported, this involves an extensive number of treatment options, and is a complaint that carries with it a high risk of complications and morbidity.  I considered the following differential and admission for this acute, potentially life threatening condition.   MDM:    DDX for generalized  weakness includes but is not limited to:  On my evaluation of the patient, there is no sister at bedside, and he is very well-appearing, stating he feels weak but otherwise denies chest pain, SOB, cough. Patient's sister had reported dark stools but patient denies dark stools. Hgb is stable.  He is hemodynamically stable.  Doubt any GI hemorrhage or significant GI bleeding.  If this continues he can follow-up outpatient for this.  He has no leukocytosis or fever, no cough or viral symptoms that would indicate a viral syndrome.  UA is negative for UTI, chest x-ray negative for pneumonia.  Doubt an infectious process causing his symptoms.  No significant metabolic or electrolyte derangements. He has mild cardiomegaly noted on the scan but no pulm edema or PNA, no hypoxia/SOB, no significant tachycardia/arrhythmia/signs of ischemia noted on EKG. First troponin is reassuring, will need repeat.   Clinical Course as of 05/17/24 2353  Tue May 13, 2024  1356 Hemoglobin(!): 10.9 Stable Hgb [HN]  1426 Comprehensive metabolic panel(!) Unremarkable in the context of this patient's presentation  [HN]  1426 WBC: 9.0 No leukocytosis  [HN]  1426 Troponin I (High Sensitivity): 3 [HN]  1451 Urinalysis, w/ Reflex to Culture (Infection Suspected) -Urine, Clean Catch No UTI [HN]  1514 Received signout; Dispo pending repeat troponin and re-evaluation.  [TY]    Clinical Course User Index [HN] Franklyn Gills SAILOR, MD [TY] Neysa Caron PARAS, DO    Labs: I Ordered, and personally interpreted labs.  The pertinent results include: Those listed above  Imaging Studies ordered: I ordered imaging studies including chest x-ray I independently visualized and interpreted imaging. I agree with the radiologist interpretation  Additional history obtained from chart review.    Cardiac Monitoring: The patient was maintained on a cardiac monitor.  I personally viewed and interpreted the cardiac monitored which showed an  underlying rhythm of: Normal sinus rhythm  Social Determinants of Health:  wheelchair-bound and sister's caretaker  Disposition:  Signed out to oncoming emergency physician Dr. Neysa pending repeat troponin and reevaluation.  If all is stable patient can likely be discharged with outpatient follow-up.  Co morbidities that complicate the patient evaluation  Past Medical History:  Diagnosis Date   Alcohol abuse    11-15-2017  per pt last alcohol Dec 2018   BPH with obstruction/lower urinary tract symptoms    Chronic arm pain    Chronic pain    arms, leg, back   CKD (chronic kidney disease), stage II    Cocaine abuse (HCC)    11-15-2017  per pt last used March 2019   Congenital hydrocephalus, unspecified (HCC)    slow progression   Diabetic retinopathy of both eyes (HCC)    Diastolic CHF, chronic (HCC) 07/2017   Gait instability    multifactorial -- slow worsening congenital hydrocephalus, peripheral neuropathy, left C5-6 cord lesion   Hematuria    History of acute pulmonary edema 07/2017   History of spinal cord injury 01/02/2014   pt fell, caused spinal cord contusion at C5-6--  residual left side weakness     History of ST elevation myocardial infarction (STEMI) 12/26/2013   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   History of TIA (transient ischemic attack) and stroke    hx cva 12-20-2010 and  TIA 02-22-2011--- no residual's from cva or tia   Hyperlipidemia    Hypertension    followed by pcp   Left-sided weakness 12/2013   chronic due to spinal cord contusion   Lesion of bladder    Peripheral edema    chronic LUE   Peripheral neuropathy    Type 2 diabetes mellitus (HCC)    followed by dr barnie louder--  last A1c 6.8 on 11-06-2017   Weak urinary stream      Medicines No orders of the defined types were placed in this encounter.   I have reviewed the patients home medicines and have made adjustments as needed  Problem List / ED Course: Problem List Items Addressed  This Visit   None Visit Diagnoses       Chest pain, unspecified type    -  Primary   Relevant Orders   Ambulatory referral to Cardiology                   This note was created using dictation software, which may contain spelling or grammatical errors.    Franklyn Sid SAILOR, MD 05/17/24 [phone]

## 2024-05-13 NOTE — Discharge Instructions (Addendum)
 Please follow-up with cardiology.  They should call to schedule an appointment, however if you do not hear from them in the next 24 hours please call to schedule an appointment.  Return to fevers, chills, lightheadedness, passout, chest pain returns, difficulty breathing, uncontrolled nausea vomiting, severe pain or any new or worsening symptoms that are concerning to you

## 2024-06-27 ENCOUNTER — Observation Stay (HOSPITAL_COMMUNITY)

## 2024-06-27 ENCOUNTER — Observation Stay (HOSPITAL_COMMUNITY)
Admission: EM | Admit: 2024-06-27 | Discharge: 2024-07-04 | Disposition: A | Discharge status: Skilled Nursing Facility | Source: Home / Self Care | Attending: Family Medicine | Admitting: Family Medicine

## 2024-06-27 ENCOUNTER — Emergency Department (HOSPITAL_COMMUNITY)

## 2024-06-27 DIAGNOSIS — I5030 Unspecified diastolic (congestive) heart failure: Secondary | ICD-10-CM | POA: Diagnosis present

## 2024-06-27 DIAGNOSIS — Z950 Presence of cardiac pacemaker: Secondary | ICD-10-CM | POA: Diagnosis present

## 2024-06-27 DIAGNOSIS — I5032 Chronic diastolic (congestive) heart failure: Secondary | ICD-10-CM

## 2024-06-27 DIAGNOSIS — R4182 Altered mental status, unspecified: Secondary | ICD-10-CM

## 2024-06-27 DIAGNOSIS — F1911 Other psychoactive substance abuse, in remission: Secondary | ICD-10-CM | POA: Diagnosis present

## 2024-06-27 DIAGNOSIS — Z87828 Personal history of other (healed) physical injury and trauma: Secondary | ICD-10-CM

## 2024-06-27 DIAGNOSIS — K5900 Constipation, unspecified: Secondary | ICD-10-CM | POA: Diagnosis present

## 2024-06-27 DIAGNOSIS — E119 Type 2 diabetes mellitus without complications: Secondary | ICD-10-CM

## 2024-06-27 DIAGNOSIS — R569 Unspecified convulsions: Principal | ICD-10-CM

## 2024-06-27 DIAGNOSIS — I442 Atrioventricular block, complete: Secondary | ICD-10-CM | POA: Diagnosis present

## 2024-06-27 DIAGNOSIS — L723 Sebaceous cyst: Secondary | ICD-10-CM | POA: Diagnosis present

## 2024-06-27 DIAGNOSIS — K5901 Slow transit constipation: Secondary | ICD-10-CM

## 2024-06-27 LAB — RAPID URINE DRUG SCREEN, HOSP PERFORMED
Amphetamines: NOT DETECTED
Barbiturates: NOT DETECTED
Benzodiazepines: NOT DETECTED
Cocaine: NOT DETECTED
Opiates: NOT DETECTED
Tetrahydrocannabinol: NOT DETECTED

## 2024-06-27 LAB — COMPREHENSIVE METABOLIC PANEL WITH GFR
ALT: 15 U/L (ref 0–44)
AST: 19 U/L (ref 15–41)
Albumin: 3.7 g/dL (ref 3.5–5.0)
Alkaline Phosphatase: 79 U/L (ref 38–126)
Anion gap: 9 (ref 5–15)
BUN: 31 mg/dL — ABNORMAL HIGH (ref 8–23)
CO2: 27 mmol/L (ref 22–32)
Calcium: 9.3 mg/dL (ref 8.9–10.3)
Chloride: 108 mmol/L (ref 98–111)
Creatinine, Ser: 1.4 mg/dL — ABNORMAL HIGH (ref 0.61–1.24)
GFR, Estimated: 56 mL/min — ABNORMAL LOW (ref >60)
Glucose, Bld: 147 mg/dL — ABNORMAL HIGH (ref 70–99)
Potassium: 5 mmol/L (ref 3.5–5.1)
Sodium: 144 mmol/L (ref 135–145)
Total Bilirubin: 1 mg/dL (ref 0.0–1.2)
Total Protein: 7.7 g/dL (ref 6.5–8.1)

## 2024-06-27 LAB — CBC WITH DIFFERENTIAL/PLATELET
Abs Immature Granulocytes: 0.03 K/uL (ref 0.00–0.07)
Basophils Absolute: 0.1 K/uL (ref 0.0–0.1)
Basophils Relative: 1 %
Eosinophils Absolute: 0.3 K/uL (ref 0.0–0.5)
Eosinophils Relative: 3 %
HCT: 39.1 % (ref 39.0–52.0)
Hemoglobin: 12.1 g/dL — ABNORMAL LOW (ref 13.0–17.0)
Immature Granulocytes: 0 %
Lymphocytes Relative: 15 %
Lymphs Abs: 1.4 K/uL (ref 0.7–4.0)
MCH: 25.5 pg — ABNORMAL LOW (ref 26.0–34.0)
MCHC: 30.9 g/dL (ref 30.0–36.0)
MCV: 82.3 fL (ref 80.0–100.0)
Monocytes Absolute: 0.7 K/uL (ref 0.1–1.0)
Monocytes Relative: 7 %
Neutro Abs: 6.9 K/uL (ref 1.7–7.7)
Neutrophils Relative %: 74 %
Platelets: 300 K/uL (ref 150–400)
RBC: 4.75 MIL/uL (ref 4.22–5.81)
RDW: 15.9 % — ABNORMAL HIGH (ref 11.5–15.5)
WBC: 9.4 K/uL (ref 4.0–10.5)
nRBC: 0 % (ref 0.0–0.2)

## 2024-06-27 LAB — URINALYSIS, ROUTINE W REFLEX MICROSCOPIC
Bilirubin Urine: NEGATIVE
Glucose, UA: NEGATIVE mg/dL
Hgb urine dipstick: NEGATIVE
Ketones, ur: NEGATIVE mg/dL
Nitrite: NEGATIVE
Protein, ur: 100 mg/dL — AB
Specific Gravity, Urine: 1.017 (ref 1.005–1.030)
WBC, UA: >50 WBC/hpf (ref 0–5)
pH: 6 (ref 5.0–8.0)

## 2024-06-27 LAB — CBG MONITORING, ED
Glucose-Capillary: 171 mg/dL — ABNORMAL HIGH (ref 70–99)
Glucose-Capillary: 159 mg/dL — ABNORMAL HIGH (ref 70–99)

## 2024-06-27 LAB — SEDIMENTATION RATE: Sed Rate: 32 mm/h — ABNORMAL HIGH (ref 0–16)

## 2024-06-27 LAB — C-REACTIVE PROTEIN: CRP: 1.7 mg/dL — ABNORMAL HIGH

## 2024-06-27 LAB — AMMONIA: Ammonia: 15 umol/L (ref 9–35)

## 2024-06-27 LAB — ETHANOL: Alcohol, Ethyl (B): <15 mg/dL (ref <15)

## 2024-06-27 MED ORDER — LEVETIRACETAM (KEPPRA) 500 MG/5 ML ADULT IV PUSH
2500.0000 mg | Freq: Once | INTRAVENOUS | Status: AC
  Administered 2024-06-27: 2500 mg via INTRAVENOUS
  Filled 2024-06-27: qty 25

## 2024-06-27 MED ORDER — LORAZEPAM 2 MG/ML IJ SOLN: 1.0000 mg | Freq: Four times a day (QID) | INTRAMUSCULAR | Status: AC | PRN

## 2024-06-27 MED ORDER — LORAZEPAM 2 MG/ML IJ SOLN: 1.0000 mg | Freq: Once | INTRAMUSCULAR | Status: DC

## 2024-06-27 MED ORDER — ONDANSETRON HCL 4 MG PO TABS: 4.0000 mg | ORAL_TABLET | Freq: Four times a day (QID) | ORAL | Status: DC | PRN

## 2024-06-27 MED ORDER — SMOG ENEMA
400.0000 mL | Freq: Once | RECTAL | Status: AC
  Administered 2024-06-28: 400 mL via RECTAL
  Filled 2024-06-27: qty 960

## 2024-06-27 MED ORDER — SODIUM CHLORIDE 0.9% FLUSH
3.0000 mL | Freq: Two times a day (BID) | INTRAVENOUS | Status: DC
  Administered 2024-06-28 – 2024-07-03 (×8): 3 mL via INTRAVENOUS

## 2024-06-27 MED ORDER — ACETAMINOPHEN 325 MG PO TABS: 650.0000 mg | ORAL_TABLET | Freq: Four times a day (QID) | ORAL | Status: DC | PRN

## 2024-06-27 MED ORDER — ALBUTEROL SULFATE (2.5 MG/3ML) 0.083% IN NEBU: 2.5000 mg | INHALATION_SOLUTION | Freq: Four times a day (QID) | RESPIRATORY_TRACT | Status: DC | PRN

## 2024-06-27 MED ORDER — ACETAMINOPHEN 650 MG RE SUPP: 650.0000 mg | Freq: Four times a day (QID) | RECTAL | Status: DC | PRN

## 2024-06-27 MED ORDER — SENNOSIDES-DOCUSATE SODIUM 8.6-50 MG PO TABS
1.0000 | ORAL_TABLET | Freq: Two times a day (BID) | ORAL | Status: DC
  Administered 2024-06-27 – 2024-07-03 (×4): 1 via ORAL
  Filled 2024-06-27 (×6): qty 1

## 2024-06-27 MED ORDER — ENOXAPARIN SODIUM 40 MG/0.4ML IJ SOSY
40.0000 mg | PREFILLED_SYRINGE | INTRAMUSCULAR | Status: DC
  Administered 2024-06-27 – 2024-07-03 (×7): 40 mg via SUBCUTANEOUS
  Filled 2024-06-27 (×7): qty 0.4

## 2024-06-27 MED ORDER — MELATONIN 3 MG PO TABS
3.0000 mg | ORAL_TABLET | Freq: Every evening | ORAL | Status: DC | PRN
  Administered 2024-06-27 – 2024-06-30 (×3): 3 mg via ORAL
  Filled 2024-06-27 (×3): qty 1

## 2024-06-27 MED ORDER — ONDANSETRON HCL 4 MG/2ML IJ SOLN: 4.0000 mg | Freq: Four times a day (QID) | INTRAMUSCULAR | Status: DC | PRN

## 2024-06-27 NOTE — Consult Note (Signed)
 NEUROLOGY CONSULT NOTE   Date of service: June 27, 2024 Patient Name: [name] MRN:  [id number] DOB:  [birth year] Chief Complaint: seizures Requesting Provider: Claudene Maximino LABOR, MD  History of Present Illness  [name] is a 63 y.o. 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  Family at bedside reports that they got him up at 0900 this AM to give him his medications and he seemed foggy. He smoked a cigarette but just did not seem himself. Got him up again at 11AM and he was difficult to wake up. Niece sprinkled some water  on his face and that helped a little. She eventually called 911. He seemed to be improving in the ED and then had a 3 mins witnessed GTC seizure in the ED.  Famiy reports that patient has been dealing with painful itchy bumps on the back of his head, face and groin. They leak pus when they burst.  Patient is wheelchair dependent at baseline. He was in a SNF for the last 5 years before family moved him back with them about 3 months ago.   ROS  Unable to ascertain due to drowsiness.  Past History   Past Medical History:  Diagnosis Date   Alcohol abuse    11-15-2017  per pt last alcohol Dec 2018   BPH with obstruction/lower urinary tract symptoms    Chronic arm pain    Chronic pain    arms, leg, back   CKD (chronic kidney disease), stage II    Cocaine abuse (HCC)    11-15-2017  per pt last used March 2019   Congenital hydrocephalus, unspecified (HCC)    slow progression   Diabetic retinopathy of both eyes (HCC)    Diastolic CHF, chronic (HCC) 07/2017   Gait instability    multifactorial -- slow worsening congenital hydrocephalus, peripheral neuropathy, left C5-6 cord lesion   Hematuria    History of acute pulmonary edema 07/2017   History of spinal cord injury 01/02/2014   pt  fell, caused spinal cord contusion at C5-6--  residual left side weakness     History of ST elevation myocardial infarction (STEMI) 12/26/2013   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   History of TIA (transient ischemic attack) and stroke    hx cva 12-20-2010 and  TIA 02-22-2011--- no residual's from cva or tia   Hyperlipidemia    Hypertension    followed by pcp   Left-sided weakness 12/2013   chronic due to spinal cord contusion   Lesion of bladder    Peripheral edema    chronic LUE   Peripheral neuropathy    Type 2 diabetes mellitus (HCC)    followed by dr barnie louder--  last A1c 6.8 on 11-06-2017   Weak urinary stream     Past Surgical History:  Procedure Laterality Date   CARDIAC CATHETERIZATION  12/01/2008   dr hochrein   abnormal stress myoview:  mild coronary plaque, normal LVF   CARDIAC CATHETERIZATION  04/29/2011   dr jordan   in setting ECG with new ST elevation & severe hypertensive:  nonobstructive atherosclerotic CAD, normal LVF (30% mRCA)    CARDIOVASCULAR STRESS TEST  08/15/2010   Low nuclear study w/ no evidence ishemia/  ef 42% with lateral and apical hypokinesis   CYSTOSCOPY WITH BIOPSY N/A 11/20/2017   Procedure: CYSTOSCOPY WITH BIOPSY AND  FULGURATION;  Surgeon: Watt Rush, MD;  Location[facility];  Service: Urology;  Laterality: N/A;   INCISION AND DRAINAGE RIGHT DISTAL MEDIAL THIGH HEMATOMA  04-14-2005   dr vernetta   left arm skin graft  1976   injury   LEFT HEART CATHETERIZATION WITH CORONARY ANGIOGRAM Bilateral 12/26/2013   Procedure: LEFT HEART CATHETERIZATION WITH CORONARY ANGIOGRAM;  Surgeon: Lonni JONETTA Cash, MD;  Location: [facility] CATH LAB;  Service: Cardiovascular;  Laterality: Bilateral;  STEMI, in setting cocaine/ alcohol :  mid disease in the RCA (20%), moderate disease in the small caliber intermediate branch (distal 50-60%), normal LVSF (ef 55-60%)   POSTERIOR CERVICAL FUSION/FORAMINOTOMY N/A 03/05/2020   Procedure: CERVICAL  THREE-FOUR, CERVICAL FOUR-FIVE, CERVICAL FIVE-SIX POSTERIOR CERVICAL FUSION/FORAMINOTOMY;  Surgeon: Joshua Alm RAMAN, MD;  Location: [facility] OR;  Service: Neurosurgery;  Laterality: N/A;   TOOTH EXTRACTION N/A 05/16/2018   Procedure: DENTAL RESTORATION/EXTRACTIONS WITH ALVEO;  Surgeon: Sheryle Hamilton, DDS;  Location: [facility];  Service: Oral Surgery;  Laterality: N/A;   TRANSTHORACIC ECHOCARDIOGRAM  08/05/2017   moderate LVH,  ef 55%,  grade 1 diastolic dysfunction/  mild LAE/  trivial Tr    Family History: Family History  Problem Relation Age of Onset   Hypertension Mother    Diabetes Mother    Cancer Mother        breast cancer    Hypertension Father    Heart disease Father    Stroke Father    Hypertension Sister     Social History  reports that he quit smoking about 6 years ago. His smoking use included cigarettes. He started smoking about 51 years ago. He has never used smokeless tobacco. He reports that he does not currently use alcohol. He reports that he does not currently use drugs after having used the following drugs: Crack cocaine and Cocaine.  No Known Allergies  Medications   Current Facility-Administered Medications:    acetaminophen  (TYLENOL ) tablet 650 mg, 650 mg, Oral, Q6H PRN **OR** acetaminophen  (TYLENOL ) suppository 650 mg, 650 mg, Rectal, Q6H PRN, Claudene, Rondell A, MD   albuterol  (PROVENTIL ) (2.5 MG/3ML) 0.083% nebulizer solution 2.5 mg, 2.5 mg, Nebulization, Q6H PRN, Claudene, Rondell A, MD   enoxaparin  (LOVENOX ) injection 40 mg, 40 mg, Subcutaneous, Q24H, Smith, Rondell A, MD, 40 mg at 06/27/24 1824   LORazepam  (ATIVAN ) injection 1-2 mg, 1-2 mg, Intravenous, Q6H PRN, Smith, Rondell A, MD   melatonin tablet 3 mg, 3 mg, Oral, QHS PRN, Opyd, Timothy S, MD   ondansetron  (ZOFRAN ) tablet 4 mg, 4 mg, Oral, Q6H PRN **OR** ondansetron  (ZOFRAN ) injection 4 mg, 4 mg, Intravenous, Q6H PRN, Smith, Rondell A, MD   senna-docusate (Senokot-S) tablet 1 tablet, 1  tablet, Oral, BID, Smith, Rondell A, MD   sodium chloride  flush (NS) 0.9 % injection 3 mL, 3 mL, Intravenous, Q12H, Smith, Rondell A, MD   sorbitol , magnesium  hydroxide, mineral oil, glycerin (SMOG) enema, 400 mL, Rectal, Once, Claudene Reeves A, MD  Vitals   Vitals:   06/27/24 1715 06/27/24 1827 06/27/24 1850 06/27/24 1942  BP: (!) 137/92  (!) 146/84 127/74  Pulse: 97  95 89  Resp: 17  16 20   Temp:  98.5 F (36.9 C) 98.4 F (36.9 C)   TempSrc:  Axillary Oral   SpO2: 100%  100% 100%    There is no height or weight on file to calculate BMI.   Physical Exam   General: Laying comfortably in bed; in no acute distress.  HENT: Normal  oropharynx and mucosa. Normal external appearance of ears and nose.  Neck: Supple, no pain or tenderness  CV: No JVD. No peripheral edema.  Pulmonary: Symmetric Chest rise. Normal respiratory effort.  Abdomen: Soft to touch, non-tender.  Ext: No cyanosis, edema, or deformity  Skin: No rash. Normal palpation of skin.   Musculoskeletal: Normal digits and nails by inspection. No clubbing.   Neurologic Examination  Mental status/Cognition: somnolence, partially opens eyes to vigorous tactile stimulation. Speech/language: limited speech. With a lot of stimulation, he is able to state his name. Cranial nerves:   CN II Pupils equal and reactive to light, make brief eye contact on left and right.   CN III,IV,VI EOM intact, no gaze preference or deviation, no nystagmus    CN V normal sensation in V1, V2, and V3 segments bilaterally   CN VII no asymmetry, no nasolabial fold flattening    CN VIII normal hearing to speech    CN IX & X normal palatal elevation, no uvular deviation    CN XI 5/5 head turn and 5/5 shoulder shrug bilaterally    CN XII midline tongue protrusion    Motor:  Muscle bulk: poor in BL legs. 4/5 hand grip BL 2/5 in BL lower extremities but paraplegic at baseline.  Sensation:  Light touch Intact throughout   Pin prick    Temperature     Vibration   Proprioception    Coordination/Complex Motor:  - Unable to assess.  Labs/Imaging/Neurodiagnostic studies   CBC:  Recent Labs  Lab 12-Jul-2024 1515  WBC 9.4  NEUTROABS 6.9  HGB 12.1*  HCT 39.1  MCV 82.3  PLT 300   Basic Metabolic Panel:  Lab Results  Component Value Date   NA 144 12-Jul-2024   K 5.0 2024-07-12   CO2 27 2024-07-12   GLUCOSE 147 (H) 07/12/24   BUN 31 (H) 07/12/24   CREATININE 1.40 (H) July 12, 2024   CALCIUM  9.3 Jul 12, 2024   GFRNONAA 56 (L) Jul 12, 2024   GFRAA >60 03/26/2020   Lipid Panel:  Lab Results  Component Value Date   LDLCALC 94 04/17/2019   HgbA1c:  Lab Results  Component Value Date   HGBA1C 7.1 (H) 02/26/2020   Urine Drug Screen:     Component Value Date/Time   LABOPIA NONE DETECTED 07/12/24 1820   COCAINSCRNUR NONE DETECTED Jul 12, 2024 1820   LABBENZ NONE DETECTED 07-12-2024 1820   AMPHETMU NONE DETECTED Jul 12, 2024 1820   THCU NONE DETECTED 07/12/24 1820   LABBARB NONE DETECTED 07/12/24 1820    Alcohol Level     Component Value Date/Time   ETH <15 07-12-24 1525   INR  Lab Results  Component Value Date   INR 1.0 12/25/2019   APTT  Lab Results  Component Value Date   APTT 29 12/25/2019   AED levels: No results found for: PHENYTOIN, ZONISAMIDE, LAMOTRIGINE, LEVETIRACETA  CT Head without contrast(Personally reviewed): 1. No acute intracranial abnormality. 2. Stable ventriculomegaly.  Neurodiagnostics cEEG:  pending  ASSESSMENT   Jaxn ROSBEL BUCKNER is a 63 y.o. 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  On my evaluation, mentation is improving but he has intermittent L leg twitching that seemed arhythmic. No meningismus on exam, labs with no leukocytosis, vitals not concerning for sepsis. Low suspicion for  meningitis.  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   RECOMMENDATIONS  - Load with Keppra  2500mg  Iv once - Start Keppra  500mg  Bid - LTM EEG overnight for noted brief L leg jerking. - We will continue to follow along. ______________________________________________________________________  Plan discussed with Dr. Elnor with the ED team.  Signed, Ellouise Mari, MD [facility]

## 2024-06-27 NOTE — ED Triage Notes (Signed)
 Patient BIB [facility] from home with family who is a caregiver. Patient been home for 2 month. EMS reported normally A& Ox4, GCS 12, also stand and pivot. Patient was left in the room by himself at home at 0930 and when family came back to the room at 11:40 he was lethargic and altered  When Ems try to talk to patient he would fall asleep. They try to do perform a stroke screen on him he was not saying much.  Patient does have left sided weakness at base line from a prior stroke.Patient able to move legs around and pupils 4 equal and reactive for EMS. Patient does have a pacemaker. Vital signs: BP 180110, HR 80, Spo2 99% RA CBG 177.

## 2024-06-27 NOTE — H&P (Signed)
 History and Physical    Patient: [name] FMW:[id number] DOB: April 14, 1961 DOA: 06/27/2024 DOS: the patient was seen and examined on 06/27/2024 PCP: Venson Candis CROME, NP  Patient coming from: Home EMS  Chief Complaint:  Chief Complaint  Patient presents with   Altered Mental Status   HPI: [name] is a 63 y.o. male with medical history significant of hypertension, hyperlipidemia, diabetes mellitus, hydrocephalus, TIA cervical stenosis with cord compression status post decompressive cervical laminectomy and medial facetectomy with residual quadriparesis, diabetic neuropathy, chronic diastolic CHF, cocaine abuse, alcohol abuse, tobacco abuse presents for altered mental status and possible infection. He is accompanied by his niece and sister.  He has been experiencing intermittent episodes of fogginess and altered mental status. His baseline is alert and communicative, though he is wheelchair-bound and can pivot from bed to chair. On the morning of the visit, he was slightly foggy, which was not unusual, but by 11:00 AM, he was found with his eyes closed and unresponsive. He did not initially respond to his name, but after water  was sprinkled on his face, he woke up slightly but remained altered for which his niece called 911.  There is no history of seizures prior to the current episode. He has not been observed to have any formal seizure activity at home. He has been at home for three months after being brought from a facility due to multiple health issues.  There is a concern for a possible infection. He has been treated with five rounds of antibiotics for an infection initially thought to be affecting his head, neck, and inguinal regions.  Areas have been persistent despite antibiotic treatment, described as leaking fluid and swelling, then subsiding.    He has a history of smoking, currently reduced to one or two cigarettes a day from three a day while at the facility. He does not  consume alcohol and has not had any other exposure to drugs recently. He was previously in a facility where he had multiple health issues, including bed sores, which have improved since being at home. There is no current presence of bed sores.  In the emergency department patient was noted to be afebrile with blood pressures elevated up to 166/97 and all other vital signs maintained.  Labs noted hemoglobin 12.1, BUN 31, creatinine 1.4, and alcohol level undetectable.  CT scan of the head noted no acute abnormality and stable ventriculomegaly as well as note of stable areas of lobulated dermal thickening and scattered sebaceous cyst.  CT scan of the cervical spine did not note any acute abnormality.  While in the ED and patient was witnessed having seizure activity.  Neurology had been formally consulted and recommended loading patient with Keppra  2500 mg IV.  Neurology ordered EEG.  Review of Systems: As mentioned in the history of present illness. All other systems reviewed and are negative. Past Medical History:  Diagnosis Date   Alcohol abuse    11-15-2017  per pt last alcohol Dec 2018   BPH with obstruction/lower urinary tract symptoms    Chronic arm pain    Chronic pain    arms, leg, back   CKD (chronic kidney disease), stage II    Cocaine abuse (HCC)    11-15-2017  per pt last used March 2019   Congenital hydrocephalus, unspecified (HCC)    slow progression   Diabetic retinopathy of both eyes (HCC)    Diastolic CHF, chronic (HCC) 07/2017   Gait instability    multifactorial --  slow worsening congenital hydrocephalus, peripheral neuropathy, left C5-6 cord lesion   Hematuria    History of acute pulmonary edema 07/2017   History of spinal cord injury 01/02/2014   pt fell, caused spinal cord contusion at C5-6--  residual left side weakness     History of ST elevation myocardial infarction (STEMI) 12/26/2013   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   History of TIA (transient ischemic  attack) and stroke    hx cva 12-20-2010 and  TIA 02-22-2011--- no residual's from cva or tia   Hyperlipidemia    Hypertension    followed by pcp   Left-sided weakness 12/2013   chronic due to spinal cord contusion   Lesion of bladder    Peripheral edema    chronic LUE   Peripheral neuropathy    Type 2 diabetes mellitus (HCC)    followed by dr barnie louder--  last A1c 6.8 on 11-06-2017   Weak urinary stream    Past Surgical History:  Procedure Laterality Date   CARDIAC CATHETERIZATION  12/01/2008   dr hochrein   abnormal stress myoview:  mild coronary plaque, normal LVF   CARDIAC CATHETERIZATION  04/29/2011   dr jordan   in setting ECG with new ST elevation & severe hypertensive:  nonobstructive atherosclerotic CAD, normal LVF (30% mRCA)    CARDIOVASCULAR STRESS TEST  08/15/2010   Low nuclear study w/ no evidence ishemia/  ef 42% with lateral and apical hypokinesis   CYSTOSCOPY WITH BIOPSY N/A 11/20/2017   Procedure: CYSTOSCOPY WITH BIOPSY AND FULGURATION;  Surgeon: Watt Rush, MD;  Location: [facility];  Service: Urology;  Laterality: N/A;   INCISION AND DRAINAGE RIGHT DISTAL MEDIAL THIGH HEMATOMA  04-14-2005   dr vernetta   left arm skin graft  1976   injury   LEFT HEART CATHETERIZATION WITH CORONARY ANGIOGRAM Bilateral 12/26/2013   Procedure: LEFT HEART CATHETERIZATION WITH CORONARY ANGIOGRAM;  Surgeon: Lonni JONETTA Cash, MD;  Location: [facility] CATH LAB;  Service: Cardiovascular;  Laterality: Bilateral;  STEMI, in setting cocaine/ alcohol :  mid disease in the RCA (20%), moderate disease in the small caliber intermediate branch (distal 50-60%), normal LVSF (ef 55-60%)   POSTERIOR CERVICAL FUSION/FORAMINOTOMY N/A 03/05/2020   Procedure: CERVICAL THREE-FOUR, CERVICAL FOUR-FIVE, CERVICAL FIVE-SIX POSTERIOR CERVICAL FUSION/FORAMINOTOMY;  Surgeon: Joshua Alm RAMAN, MD;  Location: [facility] OR;  Service: Neurosurgery;  Laterality: N/A;   TOOTH EXTRACTION N/A 05/16/2018    Procedure: DENTAL RESTORATION/EXTRACTIONS WITH ALVEO;  Surgeon: Sheryle Hamilton, DDS;  Location: [facility];  Service: Oral Surgery;  Laterality: N/A;   TRANSTHORACIC ECHOCARDIOGRAM  08/05/2017   moderate LVH,  ef 55%,  grade 1 diastolic dysfunction/  mild LAE/  trivial Tr   Social History:  reports that he quit smoking about 6 years ago. His smoking use included cigarettes. He started smoking about 51 years ago. He has never used smokeless tobacco. He reports that he does not currently use alcohol. He reports that he does not currently use drugs after having used the following drugs: Crack cocaine and Cocaine.  No Known Allergies  Family History  Problem Relation Age of Onset   Hypertension Mother    Diabetes Mother    Cancer Mother        breast cancer    Hypertension Father    Heart disease Father    Stroke Father    Hypertension Sister     Prior to Admission medications   Medication Sig Start Date End Date Taking? Authorizing Provider  acetaminophen  (  TYLENOL  8 HOUR) 650 MG CR tablet Take 1 tablet (650 mg total) by mouth every 8 (eight) hours as needed for pain or fever. 12/03/23   Charlyn Sora, MD  ASPIR-LOW 81 MG EC tablet TAKE 1 TABLET (81 MG TOTAL) BY MOUTH DAILY. Patient taking differently: Take 81 mg by mouth daily.  01/30/20   Vicci Barnie NOVAK, MD  atorvastatin  (LIPITOR ) 40 MG tablet Take 1 tablet (40 mg total) by mouth daily. 03/26/20   Angiulli, Toribio PARAS, PA-C  BD AUTOSHIELD DUO 30G X 5 MM MISC  05/05/20   [provider]  bisacodyl  (DULCOLAX) 10 MG suppository Place 1 suppository (10 mg total) rectally at bedtime. 03/24/20   Angiulli, Toribio PARAS, PA-C  cephALEXin  (KEFLEX ) 500 MG capsule Take 1 capsule (500 mg total) by mouth 4 (four) times daily. 04/18/24   Logan Ubaldo NOVAK, PA-C  DULoxetine  (CYMBALTA ) 60 MG capsule TAKE ONE CAPSULE BY MOUTH DAILY FOR LEG AND BACK PAINS Patient taking differently: Take 60 mg by mouth daily.  02/10/20   Vicci Barnie NOVAK,  MD  enoxaparin  (LOVENOX ) 40 MG/0.4ML injection Inject 0.4 mLs (40 mg total) into the skin daily. 03/24/20 06/05/20  Angiulli, Toribio PARAS, PA-C  furosemide  (LASIX ) 40 MG tablet TAKE ONE TABLET BY MOUTH DAILY . TAKE EXTRA TABLET FOR TWO DAYS AS NEEDED FOR INCREASE LOWER EXTREMITY SWELLING Patient taking differently: Take 40 mg by mouth See admin instructions. TAKE ONE TABLET BY MOUTH DAILY . TAKE EXTRA TABLET FOR TWO DAYS AS NEEDED FOR INCREASE LOWER EXTREMITY SWELLING 02/10/20   Vicci Barnie NOVAK, MD  hydrALAZINE  (APRESOLINE ) 25 MG tablet Take 1 tablet (25 mg total) by mouth 2 (two) times daily. 03/24/20   Angiulli, Toribio PARAS, PA-C  insulin  aspart (NOVOLOG ) 100 UNIT/ML injection Inject 6 Units into the skin 3 (three) times daily with meals. 03/24/20   Angiulli, Toribio PARAS, PA-C  insulin  glargine (LANTUS ) 100 UNIT/ML injection Inject 0.43 mLs (43 Units total) into the skin daily. 03/25/20   Angiulli, Toribio PARAS, PA-C  labetalol  (NORMODYNE ) 100 MG tablet Take 0.5 tablets (50 mg total) by mouth 2 (two) times daily. 03/24/20   Angiulli, Toribio PARAS, PA-C  lidocaine  4 % Place 1 patch onto the skin 2 (two) times daily. 12/03/23   Charlyn Sora, MD  metFORMIN  (GLUCOPHAGE ) 500 MG tablet Take 500 mg by mouth 2 (two) times daily. 05/05/20   [provider]  naproxen  (NAPROSYN ) 375 MG tablet Take 1 tablet (375 mg total) by mouth 2 (two) times daily. 12/03/23   Charlyn Sora, MD  senna (SENOKOT) 8.6 MG TABS tablet Take 1 tablet (8.6 mg total) by mouth 2 (two) times daily. 03/24/20   Angiulli, Toribio PARAS, PA-C  tiZANidine  (ZANAFLEX ) 2 MG tablet Take 1 tablet (2 mg total) by mouth every 8 (eight) hours as needed for muscle spasms. 12/03/23   Charlyn Sora, MD    Physical Exam: Vitals:   06/27/24 1400 06/27/24 1535  BP: (!) 166/97   Pulse: 81   Resp: 18   Temp:  98.7 F (37.1 C)  TempSrc:  Rectal  SpO2: 100%       Constitutional: Elderly male currently in no acute distress but altered not really following  commands Eyes: PERRL, lids and conjunctivae normal ENMT: Mucous membranes are moist. Normal dentition.  Neck: normal, supple,   Respiratory: clear to auscultation bilaterally, no wheezing, no crackles. Normal respiratory effort. No accessory muscle use.  Cardiovascular: Regular rate and rhythm, no murmurs / rubs / gallops. No extremity edema.  Abdomen: no tenderness, no masses palpated.  Bowel sounds positive.  Musculoskeletal: no clubbing / cyanosis. No joint deformity upper and lower extremities. Good ROM, no contractures. Normal muscle tone.  Skin: Thickening noted of the neck with fluctuance present.  Some purulent puslike drainage noted on pitting.  Patient has similar areas of the neck as well as inguinal regions.  No significant erythema appreciated at this time Neurologic: CN 2-12 grossly intact.  Moving all extremities Psychiatric: Alert, but currently altered.  Data Reviewed:  EKG revealed atrially sensed and ventricularly paced rhythm at 88 bpm.  Reviewed labs, imaging, and records as documented.  Assessment and Plan:  New onset seizure Patient presents after having seizure.  CT scan of the head did not note any acute abnormality with stable ventriculomegaly.  Neurology consulted and recommended admission.  Patient was loaded with 2500 mg of Keppra  - Admit to a telemetry bed - Neurochecks  - Seizure precaution - Continue Keppra  as recommended - Ativan  IV as needed for seizure - Appreciate neurology consultative services we will follow-up for any further recommendations.  Constipation Present on admission.  Last bowel movement was reportedly a couple days ago.  X-rays of the abdomen revealed significant amount of fecal retention consistent with constipation. 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  History of cervical stenosis with cord compression As a  result of this back in 2021 patient is bedbound. - Continue meds once updated  Sebaceous cyst Patient noted to have multiple sebaceous cyst noted on the posterior scalp, neck, and inguinal region.  Currently no significant signs or.  No current erythema to suggest acute infection.  Family note patient has had multiple rounds of antibiotics without improvement.  Review of records show that this has been ongoing issue dating back to 25.  Records note prior referral to dermatology back in 2017. - Referral placed for dermatology in the outpatient setting  Diabetes mellitus type 2 without long-term use of insulin  On admission glucose noted to be 147.  Patient previously had been on insulin , but was recommended to stop after leaving facility and returning home.  Currently on metformin  twice daily. - Hypoglycemic protocols - Check hemoglobin A1c - Hold metformin   Diastolic congestive heart Patient appears euvolemic at this time.  Last echocardiogram noted EF to be 65 to 70%. - Continue to monitor  History of polysubstance abuse Patient with a prior history of cocaine, alcohol, and tobacco use.  Currently noted to only smoke a cigarette per day on average.  Sober from drug and alcohol least 4  years. - Follow-up UDS  Will need to reconcile home medication regimen once able  DVT prophylaxis: Lovenox  Advance Care Planning:   Code Status: Full Code    Consults: Neurology  Family Communication: Family updated at bedside  Severity of Illness: The appropriate patient status for this patient is OBSERVATION. Observation status is judged to be reasonable and necessary in order to provide the required intensity of service to ensure the patient's safety. The patient's presenting symptoms, physical exam findings, and initial radiographic and laboratory data in the context of their medical condition is felt to place them at decreased risk for further clinical deterioration. Furthermore, it is anticipated  that the patient will be medically stable for discharge from the hospital within 2 midnights of admission.   Author: Maximino DELENA Sharps, MD 06/27/2024 4:24 PM  For on call review  [email or web address]

## 2024-06-27 NOTE — ED Provider Notes (Signed)
 [place] EMERGENCY DEPARTMENT AT [facility] Provider Note   CSN: [id number] Arrival date & time: 06/27/24  1244     Patient presents with: Altered Mental Status   [name] is a 63 y.o. male.   63 year old male with a history of congenital hydrocephalus, cervical spine fusion, diabetes, complete heart block status post pacemaker placement, cocaine and alcohol abuse who presents to the emergency department with altered mental status.  History obtained per EMS.  Patient is wheelchair-bound and apparently lives with his wife.  He was smoking a cigarette in his room at 9:45 AM.  Wife gave him his medicine and then went to check on him at 1145 and was less responsive than usual and so 911 was called.  When EMS arrived they report he was very somnolent and not speaking.  On the way over he is started to respond and is moving all 4 extremities.  Repeatedly will say I am good.  Patient unable to provide additional history.  Do not have patient's wife's phone number in the chart but did try calling his sister several times without response.  Per EMS does require some assistance with all of his ADLs       Prior to Admission medications   Medication Sig Start Date End Date Taking? Authorizing Provider  acetaminophen  (TYLENOL  8 HOUR) 650 MG CR tablet Take 1 tablet (650 mg total) by mouth every 8 (eight) hours as needed for pain or fever. 12/03/23   Charlyn Sora, MD  ASPIR-LOW 81 MG EC tablet TAKE 1 TABLET (81 MG TOTAL) BY MOUTH DAILY. Patient taking differently: Take 81 mg by mouth daily.  01/30/20   Vicci Barnie NOVAK, MD  atorvastatin  (LIPITOR ) 40 MG tablet Take 1 tablet (40 mg total) by mouth daily. 03/26/20   Angiulli, Toribio JINNY, PA-C  BD AUTOSHIELD DUO 30G X 5 MM MISC  05/05/20   [provider]  bisacodyl  (DULCOLAX) 10 MG suppository Place 1 suppository (10 mg total) rectally at bedtime. 03/24/20   Angiulli, Toribio JINNY, PA-C  cephALEXin  (KEFLEX ) 500 MG capsule Take 1  capsule (500 mg total) by mouth 4 (four) times daily. 04/18/24   Logan Ubaldo NOVAK, PA-C  DULoxetine  (CYMBALTA ) 60 MG capsule TAKE ONE CAPSULE BY MOUTH DAILY FOR LEG AND BACK PAINS Patient taking differently: Take 60 mg by mouth daily.  02/10/20   Vicci Barnie NOVAK, MD  enoxaparin  (LOVENOX ) 40 MG/0.4ML injection Inject 0.4 mLs (40 mg total) into the skin daily. 03/24/20 06/05/20  Angiulli, Toribio JINNY, PA-C  furosemide  (LASIX ) 40 MG tablet TAKE ONE TABLET BY MOUTH DAILY . TAKE EXTRA TABLET FOR TWO DAYS AS NEEDED FOR INCREASE LOWER EXTREMITY SWELLING Patient taking differently: Take 40 mg by mouth See admin instructions. TAKE ONE TABLET BY MOUTH DAILY . TAKE EXTRA TABLET FOR TWO DAYS AS NEEDED FOR INCREASE LOWER EXTREMITY SWELLING 02/10/20   Vicci Barnie NOVAK, MD  hydrALAZINE  (APRESOLINE ) 25 MG tablet Take 1 tablet (25 mg total) by mouth 2 (two) times daily. 03/24/20   Angiulli, Toribio JINNY, PA-C  insulin  aspart (NOVOLOG ) 100 UNIT/ML injection Inject 6 Units into the skin 3 (three) times daily with meals. 03/24/20   Angiulli, Toribio JINNY, PA-C  insulin  glargine (LANTUS ) 100 UNIT/ML injection Inject 0.43 mLs (43 Units total) into the skin daily. 03/25/20   Angiulli, Toribio JINNY, PA-C  labetalol  (NORMODYNE ) 100 MG tablet Take 0.5 tablets (50 mg total) by mouth 2 (two) times daily. 03/24/20   Angiulli, Toribio JINNY, PA-C  lidocaine   4 % Place 1 patch onto the skin 2 (two) times daily. 12/03/23   Charlyn Sora, MD  metFORMIN  (GLUCOPHAGE ) 500 MG tablet Take 500 mg by mouth 2 (two) times daily. 05/05/20   [provider]  naproxen  (NAPROSYN ) 375 MG tablet Take 1 tablet (375 mg total) by mouth 2 (two) times daily. 12/03/23   Charlyn Sora, MD  senna (SENOKOT) 8.6 MG TABS tablet Take 1 tablet (8.6 mg total) by mouth 2 (two) times daily. 03/24/20   Angiulli, Toribio PARAS, PA-C  tiZANidine  (ZANAFLEX ) 2 MG tablet Take 1 tablet (2 mg total) by mouth every 8 (eight) hours as needed for muscle spasms. 12/03/23   Charlyn Sora, MD     Allergies: Patient has no known allergies.    Review of Systems  Updated Vital Signs BP 127/74 (BP Location: Right Arm)   Pulse 89   Temp 98.4 F (36.9 C) (Oral)   Resp 20   SpO2 100%   Physical Exam Vitals and nursing note reviewed.  Constitutional:      General: He is not in acute distress.    Appearance: He is well-developed.     Comments: Repeatedly saying I am good  HENT:     Head: Normocephalic and atraumatic.     Right Ear: External ear normal.     Left Ear: External ear normal.     Nose: Nose normal.  Eyes:     Extraocular Movements: Extraocular movements intact.     Conjunctiva/sclera: Conjunctivae normal.     Pupils: Pupils are equal, round, and reactive to light.  Cardiovascular:     Rate and Rhythm: Normal rate and regular rhythm.     Heart sounds: Normal heart sounds.     Comments: Pacemaker in left upper chest wall Pulmonary:     Effort: Pulmonary effort is normal. No respiratory distress.     Breath sounds: Normal breath sounds.  Musculoskeletal:     Cervical back: Normal range of motion and neck supple.     Right lower leg: No edema.     Left lower leg: No edema.  Skin:    General: Skin is warm and dry.  Neurological:     Mental Status: He is alert.     Comments: Withdraws all 4 extremities  Psychiatric:        Mood and Affect: Mood normal.        Behavior: Behavior normal.     (all labs ordered are listed, but only abnormal results are displayed) Labs Reviewed  COMPREHENSIVE METABOLIC PANEL WITH GFR - Abnormal; Notable for the following components:      Result Value   Glucose, Bld 147 (*)    BUN 31 (*)    Creatinine, Ser 1.40 (*)    GFR, Estimated 56 (*)    All other components within normal limits  CBC WITH DIFFERENTIAL/PLATELET - Abnormal; Notable for the following components:   Hemoglobin 12.1 (*)    MCH 25.5 (*)    RDW 15.9 (*)    All other components within normal limits  URINALYSIS, ROUTINE W REFLEX MICROSCOPIC - Abnormal;  Notable for the following components:   APPearance CLOUDY (*)    Protein, ur 100 (*)    Leukocytes,Ua LARGE (*)    Bacteria, UA RARE (*)    All other components within normal limits  CBG MONITORING, ED - Abnormal; Notable for the following components:   Glucose-Capillary 171 (*)    All other components within normal limits  CBG MONITORING, ED -  Abnormal; Notable for the following components:   Glucose-Capillary 159 (*)    All other components within normal limits  AMMONIA  RAPID URINE DRUG SCREEN, HOSP PERFORMED  ETHANOL  CBC  BASIC METABOLIC PANEL WITH GFR  C-REACTIVE PROTEIN  SEDIMENTATION RATE  HIV ANTIBODY (ROUTINE TESTING W REFLEX)  HEMOGLOBIN A1C    EKG: EKG Interpretation Date/Time:  Friday June 27 2024 13:56:33 EST Ventricular Rate:  88 PR Interval:  228 QRS Duration:  167 QT Interval:  427 QTC Calculation: 517 R Axis:   -78  Text Interpretation: Atrial-sensed ventricular-paced rhythm No further analysis attempted due to paced rhythm Confirmed by Yolande Charleston [id number]) on 06/27/2024 3:17:34 PM  Radiology: [facility] Skull 1-3 Views Result Date: 06/27/2024 CLINICAL DATA:  Altered mental status. Evaluate for shunt malfunction. EXAM: SKULL - 1-3 VIEW COMPARISON:  Head CT earlier today FINDINGS: There is no shunt or shunt catheter tubing. No evidence of focal calvarial abnormality. IMPRESSION: No shunt or shunt catheter tubing. . Electronically Signed   By: Andrea Gasman M.D.   On: 06/27/2024 14:42   DG Abd 1 View Result Date: 06/27/2024 CLINICAL DATA:  Altered level of consciousness, shunt malfunction EXAM: DG ABDOMEN 1V COMPARISON:  09/16/2020, 10/23/2023 FINDINGS: Supine frontal view of the abdomen excludes hemidiaphragms, pelvis, left flank. There is no ventriculostomy catheter identified. Large amount of retained stool throughout the colon. No bowel obstruction or ileus. No masses or abnormal calcifications. IMPRESSION: 1. Significant fecal retention consistent with  constipation. 2. No evidence of radiopaque ventriculostomy catheter. Electronically Signed   By: Ozell Daring M.D.   On: 06/27/2024 14:42   DG Cervical Spine 1 View Result Date: 06/27/2024 CLINICAL DATA:  Altered mental status. Evaluate for shunt malfunction. EXAM: DG CERVICAL SPINE - 1 VIEW COMPARISON:  CT earlier today FINDINGS: Frontal view of the cervical spine submitted. No shunt catheter tubing is seen. C3 through C6 fusion hardware. IMPRESSION: No shunt catheter tubing is seen. Electronically Signed   By: Andrea Gasman M.D.   On: 06/27/2024 14:41   CT CERVICAL SPINE WO CONTRAST Result Date: 06/27/2024 CLINICAL DATA:  Altered mental status. EXAM: CT CERVICAL SPINE WITHOUT CONTRAST TECHNIQUE: Multidetector CT imaging of the cervical spine was performed without intravenous contrast. Multiplanar CT image reconstructions were also generated. RADIATION DOSE REDUCTION: This exam was performed according to the departmental dose-optimization program which includes automated exposure control, adjustment of the mA and/or kV according to patient size and/or use of iterative reconstruction technique. COMPARISON:  06/14/2022 FINDINGS: Alignment: Chronic straightening of normal lordosis no listhesis. Skull base and vertebrae: Posterior fusion C3 through C6 with laminectomies. Hardware is intact. Lucency about the left C3 pedicle screw up to 2 mm, unchanged from prior. Lesser lucency about the right C3 pedicle screw, which extends to the C2-C3 facet. No acute fracture. The dens is intact. Soft tissues and spinal canal: No prevertebral fluid or swelling. No visible canal hematoma. Disc levels: Disc space narrowing with anterior spurring most prominently affecting C5-C6 and C6-C7. No significant change from prior exam. Upper chest: No acute findings. Other: Carotid calcifications. IMPRESSION: 1. No acute findings. 2. Posterior fusion C3 through C6. Lucency about the left C3 pedicle screw up to 2 mm, unchanged from  prior. Lesser lucency about the right C3 pedicle screw, which extends to the C2-C3 facet. Findings may represent loosening in the appropriate clinical setting. Electronically Signed   By: Andrea Gasman M.D.   On: 06/27/2024 13:48   CT HEAD WO CONTRAST Result Date: 06/27/2024 CLINICAL DATA:  Altered mental status. EXAM: CT HEAD WITHOUT CONTRAST TECHNIQUE: Contiguous axial images were obtained from the base of the skull through the vertex without intravenous contrast. RADIATION DOSE REDUCTION: This exam was performed according to the departmental dose-optimization program which includes automated exposure control, adjustment of the mA and/or kV according to patient size and/or use of iterative reconstruction technique. COMPARISON:  Most recent head CT 04/18/2024 FINDINGS: Brain: No acute intracranial hemorrhage. Stable ventriculomegaly, greater than the degree of sulcal prominence. No evidence of acute ischemia. No subdural or extra-axial collection. No midline shift. Vascular: Atherosclerosis of skullbase vasculature without hyperdense vessel or abnormal calcification. Skull: No fracture or focal lesion. Sinuses/Orbits: No acute findings. Chronic defect of the right lamina per pressure. No mastoid effusion. Other: Stable areas of lobulated dermal thickening and scattered sebaceous cysts. IMPRESSION: 1. No acute intracranial abnormality. 2. Stable ventriculomegaly. Electronically Signed   By: Andrea Gasman M.D.   On: 06/27/2024 13:43     Procedures   Medications Ordered in the ED  enoxaparin  (LOVENOX ) injection 40 mg (40 mg Subcutaneous Given 06/27/24 1824)  sodium chloride  flush (NS) 0.9 % injection 3 mL (has no administration in time range)  LORazepam  (ATIVAN ) injection 1-2 mg (has no administration in time range)  acetaminophen  (TYLENOL ) tablet 650 mg (has no administration in time range)    Or  acetaminophen  (TYLENOL ) suppository 650 mg (has no administration in time range)  ondansetron   (ZOFRAN ) tablet 4 mg (has no administration in time range)    Or  ondansetron  (ZOFRAN ) injection 4 mg (has no administration in time range)  albuterol  (PROVENTIL ) (2.5 MG/3ML) 0.083% nebulizer solution 2.5 mg (has no administration in time range)  senna-docusate (Senokot-S) tablet 1 tablet (has no administration in time range)  sorbitol , magnesium  hydroxide, mineral oil, glycerin (SMOG) enema (has no administration in time range)  levETIRAcetam  (KEPPRA ) undiluted injection 2,500 mg (2,500 mg Intravenous Given 06/27/24 1536)    Clinical Course as of 06/27/24 1955  Fri Jun 27, 2024  1518 Discussed with neurology who recommends keppra  load IV and starting 500 mg BID of keppra .  [RP]  1539 Signed out to Dr Elnor.  [RP]  1540 Handoff RCP 63 yo/m hx hydrocephalus, etoh, cocaine Confused on family arrival to his room Had a sz in the ED, post-ictal CTH w/ hydrocephalus  Neuro recommending Keppra  load and admit  Pending callback for admission  [SG]  1619 Continuous EEG [SG]    Clinical Course User Index [RP] Yolande Lamar BROCKS, MD [SG] Elnor Jayson LABOR, DO                                 Medical Decision Making Amount and/or Complexity of Data Reviewed Labs: ordered. Radiology: ordered.  Risk Prescription drug management. Decision regarding hospitalization.   63 year old male with a history of congenital hydrocephalus, cervical spine fusion, diabetes, complete heart block status post pacemaker placement, cocaine and alcohol abuse who presents to the emergency department with altered mental status.   Initial Ddx:  Seizure, syncope, ICH, hydrocephalus, arrhythmia, alcohol withdrawal  MDM/Course:  Patient presents emergency department with loss of consciousness and altered mental status.  Was confused on the way over but EMS reported that he was improving.  After being roomed his family came to the bedside it appears that he lives with his sister instead of his wife.  He was back to his  mental baseline but upon re-evaluation had a seizure that I witnessed  which lasted approximately 3 minutes.  Generalized tonic-clonic.  Was postictal afterwards.  Loaded with Keppra  after discussion with neurology.  They recommended initiating antiepileptic at this point in time and admitting the patient for observation since he had multiple seizures today.  CT head with unchanged hydrocephalus.  CT C-spine without acute findings.  Was previously noted to have a VP shunt so shunt series was ordered but appears that it was removed at some point in time.  His blood work is pending at this point in time.  Signed out to the oncoming physician awaiting the results.  Pacemaker interrogation also ordered and is pending at this point in time  This patient presents to the ED for concern of complaints listed in HPI, this involves an extensive number of treatment options, and is a complaint that carries with it a high risk of complications and morbidity. Disposition including potential need for admission considered.   Dispo: Admit to Floor  Additional history obtained from sister Records reviewed Outpatient Clinic Notes I independently reviewed the following imaging with scope of interpretation limited to determining acute life threatening conditions related to emergency care: CT Head and agree with the radiologist interpretation with the following exceptions: none I personally reviewed and interpreted the pt's EKG: see above for interpretation  I have reviewed the patients home medications and made adjustments as needed Consults: Neurology Social Determinants of health:  Geriatric  Portions of this note were generated with Scientist, clinical (histocompatibility and immunogenetics). Dictation errors may occur despite best attempts at proofreading.     Final diagnoses:  Altered mental status, unspecified altered mental status type  Seizure [facility])    ED Discharge Orders          Ordered    Ambulatory referral to Dermatology        Comments: Multiple sebaceous cyst   06/27/24 1712               Yolande Lamar BROCKS, MD 06/27/24 KENITH

## 2024-06-27 NOTE — Progress Notes (Signed)
 LTM EEG hooked up and recording with CT compatible leads. Patient is not being recorded yet due to patient being in the ED.

## 2024-06-27 NOTE — ED Provider Notes (Signed)
  Provider Note MRN:  [id number]  Arrival date & time: 06/27/24    ED Course and Medical Decision Making  Assumed care from Dr Yolande at shift change.  See note from prior team for complete details, in brief:  Clinical Course as of 06/27/24 1628  Fri Jun 27, 2024  1518 Discussed with neurology who recommends keppra  load IV and starting 500 mg BID of keppra .  [RP]  1539 Signed out to Dr Elnor.  [RP]  1540 Handoff RCP 63 yo/m hx hydrocephalus, etoh, cocaine Confused on family arrival to his room Had a sz in the ED, post-ictal CTH w/ hydrocephalus  Neuro recommending Keppra  load and admit  Pending callback for admission  [SG]  1619 Continuous EEG [SG]    Clinical Course User Index [RP] Yolande Lamar BROCKS, MD [SG] Elnor Savant A, DO   Pt symptoms improving Still having some twitching intermittently Neuro recommending continuous EEG Admit Dr Claudene   Procedures  Final Clinical Impressions(s) / ED Diagnoses     ICD-10-CM   1. Seizure-like activity (HCC)  R56.9     2. Altered mental status, unspecified altered mental status type  R41.82       ED Discharge Orders     None       Discharge Instructions   None        Elnor Savant LABOR, DO 06/27/24 1628

## 2024-06-27 NOTE — Progress Notes (Signed)
 Family at bedside for the night. Would like for patient to receive SMOG enema in the morning so he can rest for tonight, SMOG enema re-timed for morning.

## 2024-06-28 ENCOUNTER — Observation Stay (HOSPITAL_COMMUNITY)

## 2024-06-28 DIAGNOSIS — R4182 Altered mental status, unspecified: Secondary | ICD-10-CM | POA: Diagnosis not present

## 2024-06-28 DIAGNOSIS — K5901 Slow transit constipation: Secondary | ICD-10-CM | POA: Diagnosis not present

## 2024-06-28 DIAGNOSIS — R569 Unspecified convulsions: Secondary | ICD-10-CM

## 2024-06-28 DIAGNOSIS — Z950 Presence of cardiac pacemaker: Secondary | ICD-10-CM | POA: Diagnosis not present

## 2024-06-28 DIAGNOSIS — E119 Type 2 diabetes mellitus without complications: Secondary | ICD-10-CM | POA: Diagnosis not present

## 2024-06-28 DIAGNOSIS — F1911 Other psychoactive substance abuse, in remission: Secondary | ICD-10-CM | POA: Diagnosis not present

## 2024-06-28 LAB — CBC
HCT: 31.9 % — ABNORMAL LOW (ref 39.0–52.0)
Hemoglobin: 10.2 g/dL — ABNORMAL LOW (ref 13.0–17.0)
MCH: 25.8 pg — ABNORMAL LOW (ref 26.0–34.0)
MCHC: 32 g/dL (ref 30.0–36.0)
MCV: 80.6 fL (ref 80.0–100.0)
Platelets: 271 K/uL (ref 150–400)
RBC: 3.96 MIL/uL — ABNORMAL LOW (ref 4.22–5.81)
RDW: 15.9 % — ABNORMAL HIGH (ref 11.5–15.5)
WBC: 7.8 K/uL (ref 4.0–10.5)
nRBC: 0 % (ref 0.0–0.2)

## 2024-06-28 LAB — BASIC METABOLIC PANEL WITH GFR
Anion gap: 9 (ref 5–15)
BUN: 30 mg/dL — ABNORMAL HIGH (ref 8–23)
CO2: 24 mmol/L (ref 22–32)
Calcium: 9 mg/dL (ref 8.9–10.3)
Chloride: 108 mmol/L (ref 98–111)
Creatinine, Ser: 1.47 mg/dL — ABNORMAL HIGH (ref 0.61–1.24)
GFR, Estimated: 53 mL/min — ABNORMAL LOW (ref >60)
Glucose, Bld: 118 mg/dL — ABNORMAL HIGH (ref 70–99)
Potassium: 4.1 mmol/L (ref 3.5–5.1)
Sodium: 141 mmol/L (ref 135–145)

## 2024-06-28 LAB — TSH: TSH: 0.784 u[IU]/mL (ref 0.350–4.500)

## 2024-06-28 LAB — HIV ANTIBODY (ROUTINE TESTING W REFLEX): HIV Screen 4th Generation wRfx: NONREACTIVE

## 2024-06-28 LAB — HEMOGLOBIN A1C
Hgb A1c MFr Bld: 7.1 % — ABNORMAL HIGH (ref 4.8–5.6)
Mean Plasma Glucose: 157.07 mg/dL

## 2024-06-28 LAB — FOLATE: Folate: 9.1 ng/mL (ref >5.9)

## 2024-06-28 LAB — VITAMIN B12: Vitamin B-12: 241 pg/mL (ref 180–914)

## 2024-06-28 NOTE — Progress Notes (Signed)
 LTM EEG disconnected - no skin breakdown at [facility]. [facility] notified.

## 2024-06-28 NOTE — Procedures (Signed)
 Patient Name: [name]  MRN: [id number]  Epilepsy Attending: Arlin MALVA Krebs  Referring Physician/Provider: Khaliqdina, Salman, MD  Duration: 06/27/2024 1728 to 06/28/2024 1233  Patient history:  63 y.o. male who presents with gradually improving somnolence, then had a witnessed seizure in the ED. Then had intermittent L leg twitching. EEG to evaluate for seizure  Level of alertness: Awake, asleep  AEDs during EEG study: None  Technical aspects: This EEG study was done with scalp electrodes positioned according to the 10-20 International system of electrode placement. Electrical activity was reviewed with band pass filter of 1-70Hz , sensitivity of 7 uV/mm, display speed of 19mm/sec with a 60Hz  notched filter applied as appropriate. EEG data were recorded continuously and digitally stored.  Video monitoring was available and reviewed as appropriate.  Description: EEG showed continuous generalized 3 to 6 Hz theta-delta slowing. Sleep was characterized by sleep spindles (12 to 14 Hz), maximal frontocentral region.  Hyperventilation and photic stimulation were not performed.     ABNORMALITY - Continuous slow, generalized  IMPRESSION: This study is suggestive of generalized cerebral dysfunction ( encephalopathy). No seizures or epileptiform discharges were seen throughout the recording.  [name] O Antania Hoefling

## 2024-06-28 NOTE — Progress Notes (Signed)
 NEUROLOGY CONSULT FOLLOW UP NOTE   Date of service: June 28, 2024 Patient Name: [name] MRN:  [id number] DOB:  1960-09-18  Interval Hx/subjective   Awake but pleasantly confused. Thought his sister at the bedside was his niece. Vitals   Vitals:   06/27/24 1850 06/27/24 1942 06/27/24 2303 06/28/24 0408  BP: (!) 146/84 127/74 (!) 107/46 (!) 102/57  Pulse: 95 89 84 77  Resp: 16 20 18 18   Temp: 98.4 F (36.9 C) 98.7 F (37.1 C) 99.3 F (37.4 C) 98.9 F (37.2 C)  TempSrc: Oral Axillary Oral Oral  SpO2: 100% 100% 100% 100%     There is no height or weight on file to calculate BMI.  Physical Exam   General: Laying comfortably in bed; in no acute distress.  HENT: Normal oropharynx and mucosa. Normal external appearance of ears and nose.  Neck: Supple, no pain or tenderness  CV: No JVD. No peripheral edema.  Pulmonary: Symmetric Chest rise. Normal respiratory effort.  Ext: No cyanosis, edema, or deformity  Skin: No rash. Normal palpation of skin.  Neurologic Examination  Mental status/Cognition: awake, oriented to self, place, poor attention Speech/language: Fluent, comprehension intact, object naming intact, repetition intact. Cranial nerves:   CN II Pupils equal and reactive to light, no VF deficits    CN III,IV,VI EOM intact, no gaze preference or deviation, no nystagmus    CN V normal sensation in V1, V2, and V3 segments bilaterally    CN VII no asymmetry, no nasolabial fold flattening    CN VIII normal hearing to speech    CN IX & X normal palatal elevation, no uvular deviation    CN XI 5/5 head turn and 5/5 shoulder shrug bilaterally. No nuchal rigidity.   CN XII midline tongue protrusion    Motor:  Muscle bulk: normal in BL uppers - Antigravity movements in BL uppers. - paraplegic at baseline.  Medications  Current Facility-Administered Medications:    acetaminophen  (TYLENOL ) tablet 650 mg, 650 mg, Oral, Q6H PRN **OR** acetaminophen  (TYLENOL )  suppository 650 mg, 650 mg, Rectal, Q6H PRN, Smith, Rondell A, MD   albuterol  (PROVENTIL ) (2.5 MG/3ML) 0.083% nebulizer solution 2.5 mg, 2.5 mg, Nebulization, Q6H PRN, Claudene, Rondell A, MD   enoxaparin  (LOVENOX ) injection 40 mg, 40 mg, Subcutaneous, Q24H, Smith, Rondell A, MD, 40 mg at 06/27/24 1824   LORazepam  (ATIVAN ) injection 1-2 mg, 1-2 mg, Intravenous, Q6H PRN, Claudene, Rondell A, MD   melatonin tablet 3 mg, 3 mg, Oral, QHS PRN, Opyd, Evalene RAMAN, MD, 3 mg at 06/27/24 2043   ondansetron  (ZOFRAN ) tablet 4 mg, 4 mg, Oral, Q6H PRN **OR** ondansetron  (ZOFRAN ) injection 4 mg, 4 mg, Intravenous, Q6H PRN, Claudene, Rondell A, MD   senna-docusate (Senokot-S) tablet 1 tablet, 1 tablet, Oral, BID, Smith, Rondell A, MD, 1 tablet at 06/27/24 2043   sodium chloride  flush (NS) 0.9 % injection 3 mL, 3 mL, Intravenous, Q12H, Claudene Reeves A, MD  Labs and Diagnostic Imaging   CBC:  Recent Labs  Lab 06/27/24 1515 06/28/24 0157  WBC 9.4 7.8  NEUTROABS 6.9  --   HGB 12.1* 10.2*  HCT 39.1 31.9*  MCV 82.3 80.6  PLT 300 271    Basic Metabolic Panel:  Lab Results  Component Value Date   NA 141 06/28/2024   K 4.1 06/28/2024   CO2 24 06/28/2024   GLUCOSE 118 (H) 06/28/2024   BUN 30 (H) 06/28/2024   CREATININE 1.47 (H) 06/28/2024   CALCIUM  9.0 06/28/2024  GFRNONAA 53 (L) 06/28/2024   GFRAA >60 03/26/2020   Lipid Panel:  Lab Results  Component Value Date   LDLCALC 94 04/17/2019   HgbA1c:  Lab Results  Component Value Date   HGBA1C 7.1 (H) 06/28/2024   Urine Drug Screen:     Component Value Date/Time   LABOPIA NONE DETECTED 06/27/2024 1820   COCAINSCRNUR NONE DETECTED 06/27/2024 1820   LABBENZ NONE DETECTED 06/27/2024 1820   AMPHETMU NONE DETECTED 06/27/2024 1820   THCU NONE DETECTED 06/27/2024 1820   LABBARB NONE DETECTED 06/27/2024 1820    Alcohol Level     Component Value Date/Time   ETH <15 06/27/2024 1525   INR  Lab Results  Component Value Date   INR 1.0 12/25/2019   APTT   Lab Results  Component Value Date   APTT 29 12/25/2019   AED levels: No results found for: PHENYTOIN, ZONISAMIDE, LAMOTRIGINE, LEVETIRACETA   No new imaging to review today  cEEG:  This study is suggestive of generalized cerebral dysfunction ( encephalopathy). No seizures or epileptiform discharges were seen throughout the recording.   Assessment   [name] is a 63 y.o. 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   On my evaluation, mentation is improving but he has intermittent L leg twitching that seemed arhythmic. No meningismus on exam, labs with no leukocytosis, vitals not concerning for sepsis. Low suspicion for meningitis.   Etiology of seizures is likely prior significant EtOh and polysusbtance use, hx of significant head injury 3 months ago at the facility.  Recommendations  - continue Keppra  500mg  BID. - discontinue LTM EEG - MRI Brain wwith and without contrast. - B12, folate, TSH, Thiamine , RPR - Start thiamine  when levels are collected. ______________________________________________________________________  Plan discussed with patient's sister and then his niece at the bedside.  Signed, Asuncion Tapscott, MD [facility]

## 2024-06-28 NOTE — Progress Notes (Signed)
 [facility]  PROGRESS NOTE  [name] FMW:[id number] DOB: 09-02-1960 DOA: 06/27/2024 PCP: Venson Candis CROME, NP   Brief HPI:    63 y.o. 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  CT scan of the cervical spine did not note any acute abnormality.  While in the ED and patient was witnessed having seizure activity.  Neurology had been formally consulted and recommended loading patient with Keppra  2500 mg IV.  Neurology ordered EEG.   Assessment/Plan:   New onset seizure -Presented after having seizure -CT head showed no acute abnormality and stable ventriculomegaly -Neurology consulted, started on Keppra  -LTM EEG ordered per neurology    Constipation Present on admission.  Last bowel movement was reportedly a couple days ago.   -X-rays of the abdomen revealed significant amount of fecal retention consistent with constipation. 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   History of cervical stenosis with cord compression As a result of this back in 2021 patient is bedbound. - Stable   Sebaceous cyst Patient noted to have multiple sebaceous cyst noted on the posterior scalp, neck, and inguinal region.  Currently no significant signs orerythema to suggest acute infection.  Family note patient has had multiple rounds of antibiotics without improvement.   Review of records show that this has been ongoing issue dating back to 78.  Records note prior referral to dermatology back in 2017. - Referral placed for dermatology in the outpatient setting   Diabetes mellitus type 2 without long-term use of insulin  On admission glucose noted to be 147.   Patient previously had been on insulin , but was recommended to stop after leaving facility and returning home.   Currently on metformin  twice daily. - Hypoglycemic protocols - Check hemoglobin A1c - Hold metformin    Diastolic congestive heart failure Patient appears euvolemic at this time.  Last echocardiogram noted EF to be 65 to 70%. - Continue to monitor   History of polysubstance abuse Patient with a prior history of cocaine, alcohol, and tobacco use.  Currently noted to only smoke a cigarette per day on average.  Sober from drug and alcohol least 4  years. - UDS negative        DVT prophylaxis: Lovenox   Medications     enoxaparin  (LOVENOX ) injection  40 mg Subcutaneous Q24H   senna-docusate  1 tablet Oral BID   sodium chloride  flush  3 mL Intravenous Q12H   SMOG  400 mL Rectal Once     Data Reviewed:   CBG:  Recent Labs  Lab 06/27/24 1350 06/27/24 1518  GLUCAP 171* 159*    SpO2: 100 %    Vitals:   06/27/24 1850 06/27/24 1942 06/27/24 2303 06/28/24 0408  BP: (!) 146/84 127/74 (!) 107/46 (!) 102/57  Pulse: 95 89 84 77  Resp: 16 20 18 18   Temp: 98.4 F (36.9 C) 98.7 F (37.1 C) 99.3 F (37.4 C) 98.9 F (37.2 C)  TempSrc: Oral Axillary Oral Oral  SpO2: 100% 100% 100%  100%      Data Reviewed:  Basic Metabolic Panel: Recent Labs  Lab 06/27/24 1515 06/28/24 0157  NA 144 141  K 5.0 4.1  CL 108 108  CO2 27 24  GLUCOSE 147* 118*  BUN 31* 30*  CREATININE 1.40* 1.47*  CALCIUM  9.3 9.0    CBC: Recent Labs  Lab 06/27/24 1515 06/28/24 0157  WBC 9.4 7.8  NEUTROABS 6.9  --   HGB 12.1* 10.2*  HCT 39.1 31.9*  MCV 82.3 80.6  PLT 300 271     LFT Recent Labs  Lab 06/27/24 1515  AST 19  ALT 15  ALKPHOS 79  BILITOT 1.0  PROT 7.7  ALBUMIN  3.7     Antibiotics: Anti-infectives (From admission, onward)    None        CONSULTS neurology  Code Status: Full code  Family Communication: Discussed with patient'Curtis sister at bedside     Subjective   Confused, no new complaints   Objective    Physical Examination:  Appears in no acute distress S1-S2, regular, no murmur auscultated Lungs are clear to auscultation bilaterally Abdomen is soft, nontender Skin-large cyst noted at the posterior scalp Extremities no edema            [name] [name]   [facility] If 7PM-7AM, please contact night-coverage at [email or web address], Office  [phone]   06/28/2024, 8:00 AM  LOS: 0 days

## 2024-06-29 LAB — SYPHILIS: RPR W/REFLEX TO RPR TITER AND TREPONEMAL ANTIBODIES, TRADITIONAL SCREENING AND DIAGNOSIS ALGORITHM: RPR Ser Ql: NONREACTIVE

## 2024-06-29 MED ORDER — ENSURE PLUS HIGH PROTEIN PO LIQD
237.0000 mL | Freq: Two times a day (BID) | ORAL | Status: DC
  Administered 2024-06-30 – 2024-07-04 (×9): 237 mL via ORAL

## 2024-06-29 MED ORDER — CYANOCOBALAMIN 1000 MCG/ML IJ SOLN
1000.0000 ug | Freq: Once | INTRAMUSCULAR | Status: AC
  Administered 2024-06-30: 1000 ug via INTRAMUSCULAR
  Filled 2024-06-29: qty 1

## 2024-06-29 MED ORDER — THIAMINE MONONITRATE 100 MG PO TABS
100.0000 mg | ORAL_TABLET | Freq: Every day | ORAL | Status: AC
  Administered 2024-06-29 – 2024-07-04 (×6): 100 mg via ORAL
  Filled 2024-06-29 (×6): qty 1

## 2024-06-29 MED ORDER — VITAMIN B-12 1000 MCG PO TABS
1000.0000 ug | ORAL_TABLET | Freq: Every day | ORAL | Status: DC
  Administered 2024-07-01 – 2024-07-04 (×4): 1000 ug via ORAL
  Filled 2024-06-29 (×4): qty 1

## 2024-06-29 NOTE — Progress Notes (Signed)
 PROGRESS NOTE    [name]  FMW:[id number] DOB: 01-17-1961 DOA: 06/27/2024 PCP: Venson Candis CROME, NP  Chief Complaint  Patient presents with   Altered Mental Status    Brief Narrative:   63 y.o. 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  CT scan of the cervical spine did not note any acute abnormality.  While in the ED and patient was witnessed having seizure activity.  Neurology had been formally consulted and recommended loading patient with Keppra  2500 mg IV.  Neurology ordered EEG.   Assessment & Plan:   Principal Problem:   New onset seizure (HCC) Active Problems:   Constipation   Complete heart block (HCC)   Presence of permanent cardiac pacemaker   History of spinal cord injury   Sebaceous cyst   Controlled type 2 diabetes mellitus without complication, without long-term current use of insulin  (HCC)   Diastolic congestive heart failure (HCC)   History of substance abuse (HCC)  New onset seizure -Presented after having seizure -CT head showed no acute abnormality and stable ventriculomegaly -Neurology consulted, started on Keppra  -LTM EEG without seizures or epileptiform discharges -neurology recommending keppra  - b12 (low normal - follow MMA and supplement), folate (wnl), TSH (wnl), RPR (nonreactive), b1 (pending)   Per [place]  DMV statutes, patients with seizures are not allowed to drive until  they have been seizure-free for six months. Use caution  when using heavy equipment or power tools. Avoid working on ladders or at heights. Take showers instead of baths. Ensure the water  temperature is not too high on the home water  heater. Do not go swimming alone. When caring for infants or small children, sit down when holding, feeding, or changing them to minimize risk of injury to the child in the event you have [name] seizure. Also, Maintain good sleep hygiene. Avoid alcohol.  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   History of cervical stenosis with cord compression As [name] result of this back in 2021 patient is bedbound. - Stable - 12/5 CT with possible loosening of R C3 pedicle screw - follow outpatient with nsgy   Diabetes mellitus type 2 without long-term use of insulin  A1c 7.1 Currently on metformin  twice daily. SSI for now   Diastolic congestive heart failure Patient appears euvolemic at this time.  Last echocardiogram noted EF to be 65 to 70%. - Continue to monitor   History of polysubstance abuse Patient with [name] prior history of cocaine, alcohol, and tobacco use.  Currently noted to only smoke [name] cigarette per day on average.  Sober from drug and alcohol least 4  years. - UDS negative  Sebaceous cyst Patient noted to have multiple sebaceous cyst noted on the posterior scalp, neck, and inguinal region.  Currently no significant signs orerythema to suggest acute infection.  Family note patient has had multiple rounds of antibiotics without improvement.  Review of records show that this has been ongoing issue dating back to 35.  Records note prior referral to dermatology back in  2017. - Referral placed for dermatology in the outpatient setting    DVT prophylaxis: lovenox  Code Status: full Family Communication: none Disposition:   Status is: Inpatient Remains inpatient appropriate because: need for continued inpatient  care   Consultants:  neurology  Procedures:  EEG  Antimicrobials:  Anti-infectives (From admission, onward)    None       Subjective: Denies pain (says his pockets are hurting and asks me for money) No other complaints  Objective: Vitals:   06/29/24 0404 06/29/24 0841 06/29/24 1255 06/29/24 1641  BP:  (!) 144/53 (!) 141/92 134/70  Pulse: 70 (!) 59 97 (!) 109  Resp:  (!) 23 (!) 22 19  Temp:  98.4 F (36.9 C) 98 F (36.7 C) 98 F (36.7 C)  TempSrc:  Oral Oral Oral  SpO2: 100% 99% 100% 99%    Intake/Output Summary (Last 24 hours) at 06/29/2024 1654 Last data filed at 06/29/2024 0830 Gross per 24 hour  Intake 240 ml  Output 500 ml  Net -260 ml   There were no vitals filed for this visit.  Examination:  General exam: Appears calm and comfortable  Respiratory system: Clear to auscultation. Respiratory effort normal. Cardiovascular system: RRR Central nervous system: Alert, but disoriented, moving all extremities Extremities: no LEE    Data Reviewed: I have personally reviewed following labs and imaging studies  CBC: Recent Labs  Lab 06/27/24 1515 06/28/24 0157  WBC 9.4 7.8  NEUTROABS 6.9  --   HGB 12.1* 10.2*  HCT 39.1 31.9*  MCV 82.3 80.6  PLT 300 271    Basic Metabolic Panel: Recent Labs  Lab 06/27/24 1515 06/28/24 0157  NA 144 141  K 5.0 4.1  CL 108 108  CO2 27 24  GLUCOSE 147* 118*  BUN 31* 30*  CREATININE 1.40* 1.47*  CALCIUM  9.3 9.0    GFR: CrCl cannot be calculated (Unknown ideal weight.).  Liver Function Tests: Recent Labs  Lab 06/27/24 1515  AST 19  ALT 15  ALKPHOS 79  BILITOT 1.0  PROT 7.7  ALBUMIN  3.7    CBG: Recent Labs  Lab 06/27/24 1350 06/27/24 1518  GLUCAP 171* 159*     No results found for this or any previous visit (from the past 240 hours).       Radiology Studies: MR BRAIN WO CONTRAST Result Date: 06/28/2024 EXAM: MRI Brain Without Contrast 06/28/2024 06:40:29 PM TECHNIQUE: Multiplanar  multisequence MRI of the head/brain was performed without the administration of intravenous contrast. COMPARISON: CT Head 06/27/24.  MRI Head December 25, 2019. CLINICAL HISTORY: Mental status change, unknown cause; meningitis rule out FINDINGS: Moderately motion limited study and without SWI sequence. Within these limitations: BRAIN AND VENTRICLES: No acute infarct. No intracranial hemorrhage. No mass. No midline shift. Stable ventriculomegaly. Normal flow voids. ORBITS: No acute abnormality. SINUSES AND MASTOIDS: No acute abnormality. BONES AND SOFT TISSUES: Normal marrow signal. No acute soft tissue abnormality. IMPRESSION: 1. Moderately motion limited study without evidence of acute abnormality. 2. Stable ventriculomegaly. Electronically signed by: Curtis Molt MD 06/28/2024 07:24 PM EST RP Workstation: [id number]   Overnight EEG with video Result Date: 06/28/2024 Curtis Curtis KIDD, MD     06/29/2024  8:34 AM Patient Name: AYDN [name] MRN: [id number] Epilepsy Attending: Arlin [name] Curtis Referring Physician/Provider: Khaliqdina, Salman, MD Duration: 06/27/2024 1728 to 06/28/2024 1233 Patient history:  63 y.o. male who presents with gradually improving somnolence, then had Robben Jagiello witnessed seizure in the ED. Then had intermittent L leg twitching. EEG to  evaluate for seizure Level of alertness: Awake, asleep AEDs during EEG study: None Technical aspects: This EEG study was done with scalp electrodes positioned according to the 10-20 International system of electrode placement. Electrical activity was reviewed with band pass filter of 1-70Hz , sensitivity of 7 uV/mm, display speed of 47mm/sec with Amarie Viles 60Hz  notched filter applied as appropriate. EEG data were recorded continuously and digitally stored.  Video monitoring was available and reviewed as appropriate. Description: EEG showed continuous generalized 3 to 6 Hz theta-delta slowing. Sleep was characterized by sleep spindles (12 to 14 Hz), maximal frontocentral region.   Hyperventilation and photic stimulation were not performed.   ABNORMALITY - Continuous slow, generalized IMPRESSION: This study is suggestive of generalized cerebral dysfunction ( encephalopathy). No seizures or epileptiform discharges were seen throughout the recording. Priyanka O Yadav        Scheduled Meds:  enoxaparin  (LOVENOX ) injection  40 mg Subcutaneous Q24H   senna-docusate  1 tablet Oral BID   sodium chloride  flush  3 mL Intravenous Q12H   Continuous Infusions:   LOS: 1 day    Time spent: over 30 min     Meliton Monte, MD [facility]   To contact the attending provider between 7A-7P or the covering provider during after hours 7P-7A, please log into the web site [email or web address] and access using universal [place] password for that web site. If you do not have the password, please call the hospital operator.  06/29/2024, 4:54 PM

## 2024-06-30 MED ORDER — KETOCONAZOLE 2 % EX SHAM
MEDICATED_SHAMPOO | Freq: Every day | CUTANEOUS | Status: DC
  Filled 2024-06-30: qty 120

## 2024-06-30 MED ORDER — LEVETIRACETAM 500 MG PO TABS
500.0000 mg | ORAL_TABLET | Freq: Two times a day (BID) | ORAL | Status: DC
  Administered 2024-06-30 – 2024-07-04 (×9): 500 mg via ORAL
  Filled 2024-06-30 (×9): qty 1

## 2024-06-30 NOTE — Progress Notes (Signed)
 PROGRESS NOTE    [name]  FMW:[id number] DOB: 07-28-1960 DOA: 06/27/2024 PCP: Curtis Candis CROME, NP  Chief Complaint  Patient presents with   Altered Mental Status    Brief Narrative:   63 y.o. 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  CT scan of the cervical spine did not note any acute abnormality.  While in the ED and patient was witnessed having seizure activity.  Neurology had been formally consulted and recommended loading patient with Keppra  2500 mg IV.  Neurology ordered EEG.   Assessment & Plan:   Principal Problem:   New onset seizure (HCC) Active Problems:   Constipation   Complete heart block (HCC)   Presence of permanent cardiac pacemaker   History of spinal cord injury   Sebaceous cyst   Controlled type 2 diabetes mellitus without complication, without long-term current use of insulin  (HCC)   Diastolic congestive heart failure (HCC)   History of substance abuse (HCC)  New onset seizure -Presented after having seizure -CT head showed no acute abnormality and stable ventriculomegaly -Neurology consulted, started on Keppra  -LTM EEG without seizures or epileptiform discharges -neurology recommending keppra  - b12 (low normal - follow MMA and supplement), folate (wnl), TSH (wnl), RPR (nonreactive), b1 (pending)   Per [place]  DMV statutes, patients with seizures are not allowed to drive until  they have been seizure-free for six months. Use caution  when using heavy equipment or power tools. Avoid working on ladders or at heights. Take showers instead of baths. Ensure the water  temperature is not too high on the home water  heater. Do not go swimming alone. When caring for infants or small children, sit down when holding, feeding, or changing them to minimize risk of injury to the child in the event you have [name] seizure. Also, Maintain good sleep hygiene. Avoid alcohol.  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   History of cervical stenosis with cord compression As [name] result of this back in 2021 patient is bedbound. - Stable - 12/5 CT with possible loosening of R C3 pedicle screw - follow outpatient with nsgy   Diabetes mellitus type 2 without long-term use of insulin  A1c 7.1 Currently on metformin  twice daily. SSI for now   Diastolic congestive heart failure Patient appears euvolemic at this time.  Last echocardiogram noted EF to be 65 to 70%. - Continue to monitor   History of polysubstance abuse Patient with [name] prior history of cocaine, alcohol, and tobacco use.  Currently noted to only smoke [name] cigarette per day on average.  Sober from drug and alcohol least 4  years. - UDS negative  Skin Lesions Niece showed me image of his head from [name] few weeks ago - ? [place], had several boggy appearing nodules to back of scalp.  Will ask ID about KOH prep inpatient? His lesions under his chin/in his beard, suspect represent folliculitis.  Also suspect lesions in groin are folliculitis as well.    Awaiting therapy eval, suspect he'll need SNF.  DVT prophylaxis: lovenox  Code Status: full Family Communication: none Disposition:   Status is: Inpatient Remains inpatient appropriate because: need for continued inpatient care   Consultants:  neurology  Procedures:  EEG  Antimicrobials:  Anti-infectives (From admission,  onward)    None       Subjective: No complaints  Objective: Vitals:   06/29/24 1641 06/29/24 2022 06/30/24 0512 06/30/24 0819  BP: 134/70 (!) 148/81 101/61 (!) 102/47  Pulse: (!) 109 88 79 78  Resp: 19 16 14    Temp: 98 F (36.7 C) 98.9 F (37.2 C) 98.6 F (37 C) 98.7 F (37.1 C)  TempSrc: Oral Oral Oral Oral  SpO2: 99% 98% 99% 100%    Intake/Output Summary (Last 24 hours) at 06/30/2024 1410 Last data filed at 06/29/2024 1740 Gross per 24 hour  Intake 360 ml  Output 800 ml  Net -440 ml   There were no vitals filed for this visit.  Examination:  General: No acute distress. Cardiovascular: RRR Lungs: unlabored Neurological: alert, following instructions.  Moving all extremities.  Chronic LE weakness.   Skin: indurated plaques noted to back of scalp (reviewed image of scalp on niece's phone which showed several boggy appearing nodules scattered through posterior scalp) Extremities: No clubbing or cyanosis. No edema.  Data Reviewed: I have personally reviewed following labs and imaging studies  CBC: Recent Labs  Lab 06/27/24 1515 06/28/24 0157  WBC 9.4 7.8  NEUTROABS 6.9  --   HGB 12.1* 10.2*  HCT 39.1 31.9*  MCV 82.3 80.6  PLT 300 271    Basic Metabolic Panel: Recent Labs  Lab 06/27/24 1515 06/28/24 0157  NA 144 141  K 5.0 4.1  CL 108 108  CO2 27 24  GLUCOSE 147* 118*  BUN 31* 30*  CREATININE 1.40* 1.47*  CALCIUM  9.3 9.0    GFR: CrCl cannot be calculated (Unknown ideal weight.).  Liver Function Tests: Recent Labs  Lab 06/27/24 1515  AST 19  ALT 15  ALKPHOS 79  BILITOT 1.0  PROT 7.7  ALBUMIN  3.7    CBG: Recent Labs  Lab 06/27/24 1350 06/27/24 1518  GLUCAP 171* 159*     No results found for this or any previous visit (from the past 240 hours).       Radiology Studies: MR BRAIN WO CONTRAST Result Date: 06/28/2024 EXAM: MRI Brain Without Contrast 06/28/2024 06:40:29 PM TECHNIQUE: Multiplanar multisequence MRI of the  head/brain was performed without the administration of intravenous contrast. COMPARISON: CT Head 06/27/24.  MRI Head December 25, 2019. CLINICAL HISTORY: Mental status change, unknown cause; meningitis rule out FINDINGS: Moderately motion limited study and without SWI sequence. Within these limitations: BRAIN AND VENTRICLES: No acute infarct. No intracranial hemorrhage. No mass. No midline shift. Stable ventriculomegaly. Normal flow voids. ORBITS: No acute abnormality. SINUSES AND MASTOIDS: No acute abnormality. BONES AND SOFT TISSUES: Normal marrow signal. No acute soft tissue abnormality. IMPRESSION: 1. Moderately motion limited study without evidence of acute abnormality. 2. Stable ventriculomegaly. Electronically signed by: Gilmore Molt MD 06/28/2024 07:24 PM EST RP Workstation: [id number]        Scheduled Meds:  [START ON 07/01/2024] vitamin B-12  1,000 mcg Oral Daily   enoxaparin  (LOVENOX ) injection  40 mg Subcutaneous Q24H   feeding supplement  237 mL Oral BID BM   levETIRAcetam   500 mg Oral BID   senna-docusate  1 tablet Oral BID   sodium chloride  flush  3 mL Intravenous Q12H   thiamine   100 mg Oral Daily  Continuous Infusions:   LOS: 2 days    Time spent: over 30 min     Meliton Monte, MD [facility]   To contact the attending provider between 7A-7P or the covering provider during after hours 7P-7A, please log into the web site [email or web address] and access using universal [place] password for that web site. If you do not have the password, please call the hospital operator.  06/30/2024, 2:10 PM

## 2024-06-30 NOTE — TOC Initial Note (Signed)
 Transition of [facility]) - Initial/Assessment Note    Patient Details  Name: [name] MRN: [id number] Date of Birth: 10-30-1960  Transition of Care [facility]) CM/SW Contact:    [name], RN Phone Number: 06/30/2024, 11:17 AM  Clinical Narrative:                 [name] is a 63 y.o. male with medical history significant of hypertension, hyperlipidemia, diabetes mellitus, hydrocephalus, TIA cervical stenosis with cord compression status post decompressive cervical laminectomy and medial facetectomy with residual quadriparesis, diabetic neuropathy, chronic diastolic CHF, cocaine abuse, alcohol abuse, tobacco abuse presents for altered mental status and possible infection.   Pt is from home with his niece. When she is not home her mother assists with his care. Pt recently d/ced from [facility] to nieces home. Family provides his needed transportation and medications.  Niece requesting: hospital bed, lift, trapeze and over the bed table  Niece is hoping for some short term rehab prior to returning home.  Niece is asking for aides to be arranged through his medicaid for post rehab. CM will fill out the forms and sent to [facility].   Awaiting therapy evals.  IP Care management following.  Expected Discharge Plan:  (TBD) Barriers to Discharge: Continued Medical Work up   Patient Goals and CMS Choice   CMS Medicare.gov Compare Post Acute Care list provided to:: Patient Represenative (must comment) Choice offered to / list presented to :  (niece)      Expected Discharge Plan and Services In-house Referral: Clinical Social Work Discharge Planning Services: CM Consult Post Acute Care Choice: Skilled Nursing Facility Living arrangements for the past 2 months: Apartment                                      Prior Living Arrangements/Services Living arrangements for the past 2 months: Apartment Lives with:: Relatives Patient language and need for interpreter  reviewed:: Yes Do you feel safe going back to the place where you live?: Yes        Care giver support system in place?: Yes (comment) Current home services: DME (wheelchair/ BSC/ shower seat) Criminal Activity/Legal Involvement Pertinent to Current Situation/Hospitalization: No - Comment as needed  Activities of Daily Living   ADL Screening (condition at time of admission) Independently performs ADLs?: No  Permission Sought/Granted                  Emotional Assessment Appearance:: Appears stated age   Affect (typically observed): Quiet Orientation: : Oriented to Self   Psych Involvement: No (comment)  Admission diagnosis:  Sebaceous cyst [L72.3] Seizure-like activity (HCC) [R56.9] New onset seizure (HCC) [R56.9] Altered mental status, unspecified altered mental status type [R41.82] Patient Active Problem List   Diagnosis Date Noted   New onset seizure (HCC) 06/27/2024   History of substance abuse (HCC) 06/27/2024   Complete heart block (HCC) 06/27/2024   Presence of permanent cardiac pacemaker 06/27/2024   Cervical myelopathy (HCC) 03/11/2020   Cervical stenosis of spinal canal 03/05/2020   Spinal stenosis, cervical region 02/26/2020   Cervical spinal cord compression (HCC)    Diastolic congestive heart failure (HCC) 02/12/2020   History of spinal cord injury 04/17/2019   TIA (transient ischemic attack) 03/05/2018   Benign essential HTN 03/05/2018   Sensorineural hearing loss of both ears 12/04/2017   Sebaceous cyst 02/28/2016   Mild nonproliferative  diabetic retinopathy (HCC) 01/10/2016   Hypertensive retinopathy of both eyes 01/10/2016   Spinal stenosis of lumbar region 07/15/2015   Abnormality of gait 06/07/2015   Hydrocephalus (HCC) 06/07/2015   Peripheral neuropathy 04/26/2015   Constipation 04/26/2015   Controlled type 2 diabetes mellitus without complication, without long-term current use of insulin  (HCC) 03/16/2015   Decreased hearing of both ears  03/15/2015   Tinea pedis 03/15/2015   Pre-ulcerative calluses 05/22/2014   Cocaine abuse (HCC) 12/28/2013   Left-sided weakness 12/26/2013   Hyperlipidemia associated with type 2 diabetes mellitus (HCC) 04/01/2007   Class 2 severe obesity due to excess calories with serious comorbidity and body mass index (BMI) of 36.0 to 36.9 in adult 03/28/2007   Essential hypertension 03/28/2007   PCP:  Venson Candis CROME, NP Pharmacy:   [facility] - [place], [place] - [address] [facility] [address] [place] [address] Phone: [phone] Fax: [phone]  [facility] [address] [place] [address] Phone: [phone] Fax: [phone]  [id number] [place], [place] - [address] AT [facility] [address] [place] [address] Phone: [phone] Fax: [phone]  [facility] - [place], [place] - [place] [address] [place] [address] Phone: [phone] Fax: [phone]     Social Drivers of Health (SDOH) Social History: SDOH Screenings   Food Insecurity: Unknown (10/24/2023)   Received from [facility]  Utilities: Patient Unable To Answer (06/27/2024)  Alcohol Screen: Low Risk  (09/20/2019)  Depression (PHQ2-9): Low Risk  (11/11/2019)  Financial Resource Strain: Medium Risk (09/20/2019)  Physical Activity: Inactive (09/20/2019)  Social Connections: Patient Unable To Answer (06/27/2024)  Stress: No Stress Concern Present (09/20/2019)  Tobacco Use: High Risk (01/02/2024)   Received from [facility]   SDOH Interventions:     Readmission Risk Interventions     No data to display

## 2024-06-30 NOTE — Evaluation (Signed)
 Physical Therapy Evaluation Patient Details Name: [name] MRN: [id number] DOB: 1961-05-12 Today's Date: 06/30/2024  History of Present Illness  Pt is a 63 y.o. male presenting 12/05 with AMS and possible infection. While in ED, pt with witnessed seizure like activity. EEG with encephalopathy; no seizures recorded. PMH: HTN, HLD, DM, hydrocephalus, TIA, cervical stenosis with cord compression s.p decompressive cervical laminectomy and medial facetectomy with residual quadriparesis, neuropathy, diastolic CHF, cocaine abuse, alcohol abuse, tobacco abuse.  Clinical Impression  Pt presents with admitting diagnosis above. Co-treat with OT. Pt today was able to sit EOB with +2 Mod A however noted with very poor command following and attempted to lay back down twice on his own. PTA pt reports he was mostly bedbound occasional transferring via slideboard. Patient will benefit from continued inpatient follow up therapy, <3 hours/day. PT will continue to follow.         If plan is discharge home, recommend the following: Two people to help with walking and/or transfers;A lot of help with bathing/dressing/bathroom;Assistance with cooking/housework;Assistance with feeding;Direct supervision/assist for medications management;Direct supervision/assist for financial management;Assist for transportation;Help with stairs or ramp for entrance;Supervision due to cognitive status   Can travel by private vehicle   No    Equipment Recommendations Hospital bed;Hoyer lift;Wheelchair (measurements PT);Wheelchair cushion (measurements PT)  Recommendations for Other Services       Functional Status Assessment Patient has had a recent decline in their functional status and demonstrates the ability to make significant improvements in function in a reasonable and predictable amount of time.     Precautions / Restrictions Precautions Precautions: Fall Restrictions Weight Bearing Restrictions Per Provider Order:  No      Mobility  Bed Mobility Overal bed mobility: Needs Assistance Bed Mobility: Supine to Sit, Sit to Supine     Supine to sit: +2 for physical assistance, +2 for safety/equipment, Max assist Sit to supine: Mod assist   General bed mobility comments: assist to bring BLE off EOB in partial sidelying and then heavy truncal assist to achieve upright. Pt somewhat apathetic with comment about how therapists can do it for him. likely able to perform with much lower level of assist given incr intrinsic motivation. min A to bring BLE back into bed but overall mod A due to need for heavy assist to reposition in bed    Transfers Overall transfer level: Needs assistance                 General transfer comment: lateral scoot toward [facility] with assist for truncal support as well as weight shift forward and scoot. again, limited effort on behalf of pt    Ambulation/Gait                  Stairs            Wheelchair Mobility     Tilt Bed    Modified Rankin (Stroke Patients Only)       Balance Overall balance assessment: Needs assistance Sitting-balance support: Single extremity supported, Bilateral upper extremity supported, Feet supported Sitting balance-Leahy Scale: Poor Sitting balance - Comments: L lateral lean with poor ability to push self up to midline with LUE/core. ultimately mod A + to maintain midline                                     Pertinent Vitals/Pain Pain Assessment Pain Assessment: Faces Faces Pain Scale: No  hurt    Home Living Family/patient expects to be discharged to:: Skilled nursing facility Living Arrangements: Other relatives;Other (Comment) (recently staying with niece x3 weeks (prior to that with sister)) Available Help at Discharge: Family             Home Equipment: Agricultural Consultant (2 wheels);Wheelchair - Careers Adviser (comment) (transfer board) Additional Comments: pt sister and niece present at end of  session report that it has been very difficult to care for pt at home recently which resulted in a transition from sister's home to niece's home who is a CNA. Pt predominantly cared for at bed level, questionable how frequently pt has been getting to a wheelchair but most recently with heavy assist using slide board.    Prior Function Prior Level of Function : Needs assist             Mobility Comments: predominantly cared for at bed level the past ~3 weeks. transferring with slide board with heavy assist on occasion ADLs Comments: pt assistsed with bathing and dressing from bed level, feeds self from bed level     Extremity/Trunk Assessment   Upper Extremity Assessment RUE Deficits / Details: using functionally for self feeding and holding self up at EOB LUE Deficits / Details: weakness noted with difficulty holding self up at EOB, decreased initiation as compared to R    Lower Extremity Assessment Lower Extremity Assessment: Generalized weakness    Cervical / Trunk Assessment Cervical / Trunk Assessment: Neck Surgery;Other exceptions (incomplete quad)  Communication   Communication Communication: Impaired Factors Affecting Communication: Hearing impaired (additionally with slowed initiation of verbal responses)    Cognition Arousal: Alert Behavior During Therapy: Flat affect, Impulsive                             Following commands: Impaired Following commands impaired: Only follows one step commands consistently     Cueing Cueing Techniques: Verbal cues, Gestural cues, Tactile cues     General Comments General comments (skin integrity, edema, etc.): VSS. Sister and niece present towards end of session.    Exercises     Assessment/Plan    PT Assessment Patient needs continued PT services  PT Problem List Decreased strength;Decreased range of motion;Decreased activity tolerance;Decreased balance;Decreased mobility;Decreased coordination;Decreased  cognition;Decreased knowledge of use of DME;Decreased safety awareness;Decreased knowledge of precautions;Cardiopulmonary status limiting activity       PT Treatment Interventions DME instruction;Gait training;Stair training;Functional mobility training;Therapeutic activities;Therapeutic exercise;Balance training;Cognitive remediation;Neuromuscular re-education;Patient/family education    PT Goals (Current goals can be found in the Care Plan section)  Acute Rehab PT Goals Patient Stated Goal: to go home PT Goal Formulation: With patient Time For Goal Achievement: 07/14/24 Potential to Achieve Goals: Fair    Frequency Min 1X/week     Co-evaluation               AM-PAC PT 6 Clicks Mobility  Outcome Measure Help needed turning from your back to your side while in a flat bed without using bedrails?: A Lot Help needed moving from lying on your back to sitting on the side of a flat bed without using bedrails?: A Lot Help needed moving to and from a bed to a chair (including a wheelchair)?: Total Help needed standing up from a chair using your arms (e.g., wheelchair or bedside chair)?: Total Help needed to walk in hospital room?: Total Help needed climbing 3-5 steps with a railing? : Total 6 Click Score:  8    End of Session   Activity Tolerance: Patient limited by fatigue;Patient tolerated treatment well Patient left: in bed;with call bell/phone within reach;with bed alarm set;with family/visitor present Nurse Communication: Mobility status PT Visit Diagnosis: Other abnormalities of gait and mobility (R26.89)    Time: 8870-8840 PT Time Calculation (min) (ACUTE ONLY): 30 min   Charges:   PT Evaluation $PT Eval Moderate Complexity: 1 Mod   PT General Charges $$ ACUTE PT VISIT: 1 Visit         Sueellen NOVAK, PT, DPT Acute Rehab Services [phone]   Mart Colpitts 06/30/2024, 4:01 PM

## 2024-06-30 NOTE — NC FL2 (Signed)
 [place]  MEDICAID FL2 LEVEL OF CARE FORM     IDENTIFICATION  Patient Name: [name] Birthdate: [birth year] Sex: male Admission Date (Current Location): 06/27/2024  [facility] and [place] Number:  Producer, Television/film/video and Address:  [facility] [place], [address], [place], [place] [address]      Provider Number: [id number]  Attending Physician Name and Address:  Perri DELENA Meliton Mickey., *  Relative Name and Phone Number:       Current Level of Care: Hospital Recommended Level of Care: Skilled Nursing Facility Prior Approval Number:    Date Approved/Denied:   PASRR Number: [id number]  Discharge Plan: SNF    Current Diagnoses: Patient Active Problem List   Diagnosis Date Noted   New onset seizure (HCC) 06/27/2024   History of substance abuse (HCC) 06/27/2024   Complete heart block (HCC) 06/27/2024   Presence of permanent cardiac pacemaker 06/27/2024   Cervical myelopathy (HCC) 03/11/2020   Cervical stenosis of spinal canal 03/05/2020   Spinal stenosis, cervical region 02/26/2020   Cervical spinal cord compression (HCC)    Diastolic congestive heart failure (HCC) 02/12/2020   History of spinal cord injury 04/17/2019   TIA (transient ischemic attack) 03/05/2018   Benign essential HTN 03/05/2018   Sensorineural hearing loss of both ears 12/04/2017   Sebaceous cyst 02/28/2016   Mild nonproliferative diabetic retinopathy (HCC) 01/10/2016   Hypertensive retinopathy of both eyes 01/10/2016   Spinal stenosis of lumbar region 07/15/2015   Abnormality of gait 06/07/2015   Hydrocephalus (HCC) 06/07/2015   Peripheral neuropathy 04/26/2015   Constipation 04/26/2015   Controlled type 2 diabetes mellitus without complication, without long-term current use of insulin  (HCC) 03/16/2015   Decreased hearing of both ears 03/15/2015   Tinea pedis 03/15/2015   Pre-ulcerative calluses 05/22/2014   Cocaine abuse (HCC) 12/28/2013   Left-sided weakness 12/26/2013    Hyperlipidemia associated with type 2 diabetes mellitus (HCC) 04/01/2007   Class 2 severe obesity due to excess calories with serious comorbidity and body mass index (BMI) of 36.0 to 36.9 in adult 03/28/2007   Essential hypertension 03/28/2007    Orientation RESPIRATION BLADDER Height & Weight     Self  Normal Incontinent Weight:   Height:     BEHAVIORAL SYMPTOMS/MOOD NEUROLOGICAL BOWEL NUTRITION STATUS      Incontinent Diet (heart healthy)  AMBULATORY STATUS COMMUNICATION OF NEEDS Skin   Extensive Assist Verbally Normal                       Personal Care Assistance Level of Assistance  Bathing, Feeding, Dressing Bathing Assistance: Maximum assistance Feeding assistance: Limited assistance Dressing Assistance: Maximum assistance     Functional Limitations Info             SPECIAL CARE FACTORS FREQUENCY  PT (By licensed PT), OT (By licensed OT)     PT Frequency: 5x/wk OT Frequency: 5x/wk            Contractures Contractures Info: Not present    Additional Factors Info  Code Status, Allergies Code Status Info: Full Allergies Info: NKA           Current Medications (06/30/2024):  This is the current hospital active medication list Current Facility-Administered Medications  Medication Dose Route Frequency Provider Last Rate Last Admin   acetaminophen  (TYLENOL ) tablet 650 mg  650 mg Oral Q6H PRN Claudene Reeves A, MD       Or   acetaminophen  (TYLENOL ) suppository 650  mg  650 mg Rectal Q6H PRN Smith, Rondell A, MD       albuterol  (PROVENTIL ) (2.5 MG/3ML) 0.083% nebulizer solution 2.5 mg  2.5 mg Nebulization Q6H PRN Smith, Rondell A, MD       [START ON 07/01/2024] cyanocobalamin  (VITAMIN B12) tablet 1,000 mcg  1,000 mcg Oral Daily Perri DELENA Meliton Mickey., MD       enoxaparin  (LOVENOX ) injection 40 mg  40 mg Subcutaneous Q24H Smith, Rondell A, MD   40 mg at 06/29/24 1642   feeding supplement (ENSURE PLUS HIGH PROTEIN) liquid 237 mL  237 mL Oral BID BM Perri DELENA Meliton Mickey., MD   237 mL at 06/30/24 1027   ketoconazole  (NIZORAL ) 2 % shampoo   Topical QHS Perri DELENA Meliton Mickey., MD       levETIRAcetam  (KEPPRA ) tablet 500 mg  500 mg Oral BID Perri DELENA Meliton Mickey., MD   500 mg at 06/30/24 1027   LORazepam  (ATIVAN ) injection 1-2 mg  1-2 mg Intravenous Q6H PRN Smith, Rondell A, MD       melatonin tablet 3 mg  3 mg Oral QHS PRN Opyd, Timothy S, MD   3 mg at 06/29/24 2035   ondansetron  (ZOFRAN ) tablet 4 mg  4 mg Oral Q6H PRN Smith, Rondell A, MD       Or   ondansetron  (ZOFRAN ) injection 4 mg  4 mg Intravenous Q6H PRN Smith, Rondell A, MD       senna-docusate (Senokot-S) tablet 1 tablet  1 tablet Oral BID Claudene Reeves A, MD   1 tablet at 06/28/24 1319   sodium chloride  flush (NS) 0.9 % injection 3 mL  3 mL Intravenous Q12H Smith, Rondell A, MD   3 mL at 06/30/24 1027   thiamine  (VITAMIN B1) tablet 100 mg  100 mg Oral Daily Perri DELENA Meliton Mickey., MD   100 mg at 06/30/24 1027     Discharge Medications: Please see discharge summary for a list of discharge medications.  Relevant Imaging Results:  Relevant Lab Results:   Additional Information SS#: [id number]  Almarie CHRISTELLA Goodie, LCSW

## 2024-06-30 NOTE — Evaluation (Signed)
 Occupational Therapy Evaluation Patient Details Name: [name] MRN: [id number] DOB: 1960-11-28 Today's Date: 06/30/2024   History of Present Illness   Pt is a 63 y.o. male presenting 12/05 with AMS and possible infection. While in ED, pt with witnessed seizure like activity. EEG with encephalopathy; no seizures recorded. PMH: HTN, HLD, DM, hydrocephalus, TIA, cervical stenosis with cord compression s.p decompressive cervical laminectomy and medial facetectomy with residual quadriparesis, neuropathy, diastolic CHF, cocaine abuse, alcohol abuse, tobacco abuse.     Clinical Impressions PTA, pt living with family most recently moved to nieces home due to increased need for assist and weakness. Unclear how much pt able to get OOB at baseline, but recently has not been consistently getting OOB; per chart review ~1 year ago was able to SPT and prior to 3 weeks ago was consistently transferring to chair with slide board. Pt and family lacking necessary equipment to care for pt while using proper body mechanics (see equipment recommendations below). Furthermore, per niece entering room at end of session, pt with drastic functional decline even as compared to most recent baseline. Pt currently requiring +r mod-max A for bed mobility, mod A for sitting balance, fatigues easily, with LUE weakness, and needs +2 total A for scoot up to EOB. Patient will benefit from continued inpatient follow up therapy, <3 hours/day      If plan is discharge home, recommend the following:   Two people to help with walking and/or transfers;Two people to help with bathing/dressing/bathroom;Assistance with cooking/housework;Assistance with feeding;Direct supervision/assist for medications management;Direct supervision/assist for financial management;Assist for transportation;Help with stairs or ramp for entrance     Functional Status Assessment   Patient has had a recent decline in their functional status and/or  demonstrates limited ability to make significant improvements in function in a reasonable and predictable amount of time     Equipment Recommendations   Hoyer lift;Hospital bed;Other (comment) (drop arm BSC)     Recommendations for Other Services   Speech consult     Precautions/Restrictions   Precautions Precautions: Fall Restrictions Weight Bearing Restrictions Per Provider Order: No     Mobility Bed Mobility Overal bed mobility: Needs Assistance Bed Mobility: Supine to Sit, Sit to Supine     Supine to sit: +2 for physical assistance, +2 for safety/equipment, Max assist Sit to supine: Mod assist   General bed mobility comments: assist to bring BLE off EOB in partial sidelying and then heavy truncal assist to achieve upright. Pt somewhat apathetic with comment about how therapists can do it for him. likely able to perform with much lower level of assist given incr intrinsic motivation. min A to bring BLE back into bed but overall mod A due to need for heavy assist to reposition in bed    Transfers Overall transfer level: Needs assistance                 General transfer comment: lateral scoot toward [facility] with assist for truncal support as well as weight shift forward and scoot. again, limited effort on behalf of pt      Balance Overall balance assessment: Needs assistance Sitting-balance support: Single extremity supported, Bilateral upper extremity supported, Feet supported Sitting balance-Leahy Scale: Poor Sitting balance - Comments: L lateral lean with poor ability to push self up to midline with LUE/core. ultimately mod A + to maintain midline  ADL either performed or assessed with clinical judgement   ADL Overall ADL's : Needs assistance/impaired Eating/Feeding: Set up;Bed level Eating/Feeding Details (indicate cue type and reason): self feeding burger at end of session Grooming: Moderate  assistance;Sitting;Maximal assistance Grooming Details (indicate cue type and reason): 2/2 poor sitting balance today Upper Body Bathing: Sitting;Moderate assistance;Maximal assistance   Lower Body Bathing: Total assistance;Bed level   Upper Body Dressing : Maximal assistance;Sitting   Lower Body Dressing: Total assistance;Bed level   Toilet Transfer: Total assistance;+2 for physical assistance;+2 for safety/equipment Toilet Transfer Details (indicate cue type and reason): lateral scoot toward [facility]         Functional mobility during ADLs: Total assistance;+2 for physical assistance;+2 for safety/equipment General ADL Comments: for lateral scoot to [facility]     Vision Patient Visual Report: No change from baseline Vision Assessment?: Vision impaired- to be further tested in functional context Additional Comments: decr eye alignment. Denied changes today and not formally assessed     Perception         Praxis         Pertinent Vitals/Pain Pain Assessment Pain Assessment: Faces Faces Pain Scale: No hurt     Extremity/Trunk Assessment Upper Extremity Assessment Upper Extremity Assessment: Generalized weakness;RUE deficits/detail;LUE deficits/detail RUE Deficits / Details: using functionally for self feeding and holding self up at EOB LUE Deficits / Details: weakness noted with difficulty holding self up at EOB, decreased initiation as compared to R   Lower Extremity Assessment Lower Extremity Assessment: Defer to PT evaluation   Cervical / Trunk Assessment Cervical / Trunk Assessment: Neck Surgery;Other exceptions (incomplete quad)   Communication Communication Communication: Impaired Factors Affecting Communication: Hearing impaired (additionally with slowed initiation of verbal responses)   Cognition Arousal: Alert Behavior During Therapy: Flat affect, Impulsive Cognition: Cognition impaired   Orientation impairments: Time, Situation (month, situation, able to state  in hospital, but thought he was at [facility]) Awareness: Online awareness impaired Memory impairment (select all impairments): Short-term memory, Working civil service fast streamer, Conservation officer, historic buildings Attention impairment (select first level of impairment): Sustained attention Executive functioning impairment (select all impairments): Organization, Sequencing, Problem solving, Initiation, Reasoning OT - Cognition Comments: difficulty understanding why evaluation of movement is important. stating he is living at a SNF, but family and chart report that he has been living with family. poor insight into deficits, apathy throughout session needing mod+ encouragement                 Following commands: Impaired Following commands impaired: Only follows one step commands consistently     Cueing  General Comments   Cueing Techniques: Verbal cues;Gestural cues;Tactile cues  pt attempting to self initiate return to supine 2x during session   Exercises     Shoulder Instructions      Home Living Family/patient expects to be discharged to:: Skilled nursing facility Living Arrangements: Other relatives;Other (Comment) (recently staying with niece x3 weeks (prior to that with sister)) Available Help at Discharge: Family                         Home Equipment: Agricultural Consultant (2 wheels);Wheelchair - Careers Adviser (comment) (transfer board)   Additional Comments: pt sister and niece present at end of session report that it has been very difficult to care for pt at home recently which resulted in a transition from sister's home to niece's home who is a CNA. Pt predominantly cared for at bed level, questionable how frequently pt has been  getting to a wheelchair but most recently with heavy assist using slide board.      Prior Functioning/Environment Prior Level of Function : Needs assist             Mobility Comments: predominantly cared for at bed level the past ~3 weeks.  transferring with slide board with heavy assist on occasion ADLs Comments: pt assistsed with bathing and dressing from bed level, feeds self from bed level    OT Problem List: Decreased strength;Decreased activity tolerance;Impaired balance (sitting and/or standing);Impaired vision/perception;Decreased cognition;Decreased safety awareness;Decreased knowledge of use of DME or AE;Impaired UE functional use   OT Treatment/Interventions: Self-care/ADL training;Therapeutic exercise;DME and/or AE instruction;Therapeutic activities;Patient/family education;Balance training      OT Goals(Current goals can be found in the care plan section)   Acute Rehab OT Goals Patient Stated Goal: per family, get stronger OT Goal Formulation: With patient/family Time For Goal Achievement: 07/14/24 Potential to Achieve Goals: Good   OT Frequency:  Min 2X/week    Co-evaluation              AM-PAC OT 6 Clicks Daily Activity     Outcome Measure Help from another person eating meals?: A Little Help from another person taking care of personal grooming?: A Lot Help from another person toileting, which includes using toliet, bedpan, or urinal?: Total Help from another person bathing (including washing, rinsing, drying)?: A Lot Help from another person to put on and taking off regular upper body clothing?: A Lot Help from another person to put on and taking off regular lower body clothing?: Total 6 Click Score: 11   End of Session Nurse Communication: Mobility status  Activity Tolerance: Patient tolerated treatment well (although self limiting) Patient left: in bed;with call bell/phone within reach;with bed alarm set;with family/visitor present  OT Visit Diagnosis: Unsteadiness on feet (R26.81);Muscle weakness (generalized) (M62.81);Other symptoms and signs involving cognitive function                Time: 8871-8841 OT Time Calculation (min): 30 min Charges:  OT General Charges $OT Visit: 1  Visit OT Evaluation $OT Eval Moderate Complexity: 1 Mod  Elma JONETTA Lebron FREDERICK, OTR/L [facility] Acute Rehabilitation Office: [phone]   Elma JONETTA Lebron 06/30/2024, 12:40 PM

## 2024-07-01 LAB — COMPREHENSIVE METABOLIC PANEL WITH GFR
ALT: 11 U/L (ref 0–44)
AST: 14 U/L — ABNORMAL LOW (ref 15–41)
Albumin: 2.6 g/dL — ABNORMAL LOW (ref 3.5–5.0)
Alkaline Phosphatase: 60 U/L (ref 38–126)
Anion gap: 13 (ref 5–15)
BUN: 47 mg/dL — ABNORMAL HIGH (ref 8–23)
CO2: 19 mmol/L — ABNORMAL LOW (ref 22–32)
Calcium: 8.4 mg/dL — ABNORMAL LOW (ref 8.9–10.3)
Chloride: 103 mmol/L (ref 98–111)
Creatinine, Ser: 1.74 mg/dL — ABNORMAL HIGH (ref 0.61–1.24)
GFR, Estimated: 43 mL/min — ABNORMAL LOW (ref >60)
Glucose, Bld: 176 mg/dL — ABNORMAL HIGH (ref 70–99)
Potassium: 4.4 mmol/L (ref 3.5–5.1)
Sodium: 135 mmol/L (ref 135–145)
Total Bilirubin: 0.6 mg/dL (ref 0.0–1.2)
Total Protein: 6.2 g/dL — ABNORMAL LOW (ref 6.5–8.1)

## 2024-07-01 LAB — CBC WITH DIFFERENTIAL/PLATELET
Abs Immature Granulocytes: 0.06 K/uL (ref 0.00–0.07)
Basophils Absolute: 0.1 K/uL (ref 0.0–0.1)
Basophils Relative: 1 %
Eosinophils Absolute: 0.2 K/uL (ref 0.0–0.5)
Eosinophils Relative: 2 %
HCT: 33.6 % — ABNORMAL LOW (ref 39.0–52.0)
Hemoglobin: 10.8 g/dL — ABNORMAL LOW (ref 13.0–17.0)
Immature Granulocytes: 1 %
Lymphocytes Relative: 16 %
Lymphs Abs: 1.7 K/uL (ref 0.7–4.0)
MCH: 25.7 pg — ABNORMAL LOW (ref 26.0–34.0)
MCHC: 32.1 g/dL (ref 30.0–36.0)
MCV: 79.8 fL — ABNORMAL LOW (ref 80.0–100.0)
Monocytes Absolute: 1 K/uL (ref 0.1–1.0)
Monocytes Relative: 9 %
Neutro Abs: 7.5 K/uL (ref 1.7–7.7)
Neutrophils Relative %: 71 %
Platelets: 279 K/uL (ref 150–400)
RBC: 4.21 MIL/uL — ABNORMAL LOW (ref 4.22–5.81)
RDW: 15.7 % — ABNORMAL HIGH (ref 11.5–15.5)
WBC: 10.5 K/uL (ref 4.0–10.5)
nRBC: 0 % (ref 0.0–0.2)

## 2024-07-01 LAB — MAGNESIUM: Magnesium: 2.5 mg/dL — ABNORMAL HIGH (ref 1.7–2.4)

## 2024-07-01 LAB — PHOSPHORUS: Phosphorus: 4 mg/dL (ref 2.5–4.6)

## 2024-07-01 MED ORDER — LACTATED RINGERS IV SOLN: INTRAVENOUS | Status: AC

## 2024-07-01 NOTE — Care Management Important Message (Signed)
 Important Message  Patient Details  Name: [name] MRN: [id number] Date of Birth: 10-31-1960   Important Message Given:  Yes - Medicare IM     Claretta Deed 07/01/2024, 4:08 PM

## 2024-07-01 NOTE — Progress Notes (Signed)
    Durable Medical Equipment  (From admission, onward)           Start     Ordered   07/01/24 1016  For home use only DME Overbed table  Once        07/01/24 1016   07/01/24 1016  For home use only DME Hospital bed  Once       Question Answer Comment  Length of Need 6 Months   The above medical condition requires: Patient requires the ability to reposition frequently   Head must be elevated greater than: 30 degrees   Bed type Semi-electric   [employer] Yes   [place] Yes      07/01/24 1016

## 2024-07-01 NOTE — Progress Notes (Signed)
 PROGRESS NOTE    [name]  FMW:[id number] DOB: 1961/01/09 DOA: 06/27/2024 PCP: Venson Candis CROME, NP  Chief Complaint  Patient presents with   Altered Mental Status    Brief Narrative:   63 y.o. 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  CT scan of the cervical spine did not note any acute abnormality.  While in the ED and patient was witnessed having seizure activity.  Neurology had been formally consulted and recommended loading patient with Keppra  2500 mg IV.  Neurology ordered EEG.   Assessment & Plan:   Principal Problem:   New onset seizure (HCC) Active Problems:   Constipation   Complete heart block (HCC)   Presence of permanent cardiac pacemaker   History of spinal cord injury   Sebaceous cyst   Controlled type 2 diabetes mellitus without complication, without long-term current use of insulin  (HCC)   Diastolic congestive heart failure (HCC)   History of substance abuse (HCC)  New onset seizure -Presented after having seizure -CT head showed no acute abnormality and stable ventriculomegaly -Neurology consulted, started on Keppra  -LTM EEG without seizures or epileptiform discharges -neurology recommending keppra  - b12 (low normal - follow MMA and supplement), folate (wnl), TSH (wnl), RPR (nonreactive), b1 (pending)   Per [place]  DMV statutes, patients with seizures are not allowed to drive until  they have been seizure-free for six months. Use caution  when using heavy equipment or power tools. Avoid working on ladders or at heights. Take showers instead of baths. Ensure the water  temperature is not too high on the home water  heater. Do not go swimming alone. When caring for infants or small children, sit down when holding, feeding, or changing them to minimize risk of injury to the child in the event you have Matix Henshaw seizure. Also, Maintain good sleep hygiene. Avoid alcohol.  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   History of cervical stenosis with cord compression As Montine Hight result of this back in 2021 patient is bedbound. - Stable - 12/5 CT with possible loosening of R C3 pedicle screw - follow outpatient with nsgy   Diabetes mellitus type 2 without long-term use of insulin  A1c 7.1 Currently on metformin  twice daily. SSI for now   Diastolic congestive heart failure Patient appears euvolemic at this time.  Last echocardiogram noted EF to be 65 to 70%. - Continue to monitor   History of polysubstance abuse Patient with Anmarie Fukushima prior history of cocaine, alcohol, and tobacco use.  Currently noted to only smoke Daijah Scrivens cigarette per day on average.  Sober from drug and alcohol least 4  years. - UDS negative  Skin Lesions Kerions?? Niece showed me image of his head from [facility] few weeks ago - ? Kerion, had several boggy appearing nodules to back of scalp.  Not sure if I can do koh prep inpatient, ordered fungal smear Would prefer definitive diagnosis before starting several week course of antifungal, but leaning towards treating empirically given derm follow up will take likely several  months (will discuss with family, hopefully the tests that are pending are revealing) His lesions under his chin/in his beard, suspect represent folliculitis.  Also suspect lesions in groin are folliculitis as well.    Awaiting therapy eval, suspect he'll need  SNF.    DVT prophylaxis: lovenox  Code Status: full Family Communication: none Disposition:   Status is: Inpatient Remains inpatient appropriate because: need for continued inpatient care   Consultants:  neurology  Procedures:  EEG  Antimicrobials:  Anti-infectives (From admission, onward)    None       Subjective: No complaints Niece at bedside  Objective: Vitals:   07/01/24 0027 07/01/24 0422 07/01/24 0722 07/01/24 1148  BP: 103/63 (!) 101/52 (!) 119/50 (!) 99/57  Pulse: 86 66 76 76  Resp: 20 16 17 16   Temp: 98 F (36.7 C) 98.2 F (36.8 C) 98.6 F (37 C) (!) 97.4 F (36.3 C)  TempSrc: Rectal Oral Oral Oral  SpO2: 98% 98% 98% 100%    Intake/Output Summary (Last 24 hours) at 07/01/2024 1330 Last data filed at 07/01/2024 1000 Gross per 24 hour  Intake 1440 ml  Output 1400 ml  Net 40 ml   There were no vitals filed for this visit.  Examination:  General: No acute distress. Cardiovascular: RRR Lungs: unlabored Neurological: Alert . Moves all extremities 4 . Cranial nerves II through XII grossly intact. Boggy plaques/nodules to back of scalp Extremities: No clubbing or cyanosis. No edema.  Data Reviewed: I have personally reviewed following labs and imaging studies  CBC: Recent Labs  Lab 06/27/24 1515 06/28/24 0157 07/01/24 0509  WBC 9.4 7.8 10.5  NEUTROABS 6.9  --  7.5  HGB 12.1* 10.2* 10.8*  HCT 39.1 31.9* 33.6*  MCV 82.3 80.6 79.8*  PLT 300 271 279    Basic Metabolic Panel: Recent Labs  Lab 06/27/24 1515 06/28/24 0157 07/01/24 0509  NA 144 141 135  K 5.0 4.1 4.4  CL 108 108 103  CO2 27 24 19*  GLUCOSE 147* 118* 176*  BUN 31* 30* 47*  CREATININE 1.40* 1.47* 1.74*  CALCIUM  9.3 9.0 8.4*  MG  --   --  2.5*  PHOS  --   --  4.0    GFR: CrCl cannot be calculated (Unknown ideal weight.).  Liver Function Tests: Recent Labs  Lab 06/27/24 1515 07/01/24 0509  AST 19 14*  ALT 15 11  ALKPHOS 79 60  BILITOT 1.0 0.6  PROT 7.7  6.2*  ALBUMIN  3.7 2.6*    CBG: Recent Labs  Lab 06/27/24 1350 06/27/24 1518  GLUCAP 171* 159*     No results found for this or any previous visit (from the past 240 hours).       Radiology Studies: No results found.       Scheduled Meds:  vitamin B-12  1,000 mcg Oral Daily   enoxaparin  (LOVENOX ) injection  40 mg Subcutaneous Q24H   feeding supplement  237 mL Oral BID BM   ketoconazole    Topical QHS   levETIRAcetam   500 mg Oral BID   senna-docusate  1 tablet Oral BID   sodium chloride  flush  3 mL Intravenous Q12H   thiamine   100 mg Oral Daily   Continuous Infusions:   LOS: 3 days    Time spent: over 30 min     Meliton Monte, MD [facility]   To contact the attending provider between 7A-7P or the covering provider during after hours 7P-7A, please log into the web site [email or web address]  and access using universal [place] password for that web site. If you do not have the password, please call the hospital operator.  07/01/2024, 1:30 PM

## 2024-07-01 NOTE — TOC Progression Note (Signed)
 Transition of Care [facility]) - Progression Note    Patient Details  Name: [name] MRN: [id number] Date of Birth: [birth year]  Transition of Care [facility]) CM/SW Contact  Almarie CHRISTELLA Goodie, [place] Phone Number: 07/01/2024, 10:20 AM  Clinical Narrative:   Patient's family in agreement with SNF, patient has been faxed out but has no bed offers at this time. CSW to follow.    Expected Discharge Plan: Skilled Nursing Facility Barriers to Discharge: English As A Second Language Teacher, Continued Medical Work up, SNF Pending bed offer               Expected Discharge Plan and Services In-house Referral: Clinical Social Work Discharge Planning Services: CM Consult Post Acute Care Choice: Skilled Nursing Facility Living arrangements for the past 2 months: Apartment                                       Social Drivers of Health (SDOH) Interventions SDOH Screenings   Food Insecurity: Unknown (10/24/2023)   Received from [facility]  Utilities: Patient Unable To Answer (06/27/2024)  Alcohol Screen: Low Risk  (09/20/2019)  Depression (PHQ2-9): Low Risk  (11/11/2019)  Financial Resource Strain: Medium Risk (09/20/2019)  Physical Activity: Inactive (09/20/2019)  Social Connections: Patient Unable To Answer (06/27/2024)  Stress: No Stress Concern Present (09/20/2019)  Tobacco Use: High Risk (01/02/2024)   Received from [facility]    Readmission Risk Interventions     No data to display

## 2024-07-02 LAB — CBC
HCT: 31 % — ABNORMAL LOW (ref 39.0–52.0)
Hemoglobin: 10.3 g/dL — ABNORMAL LOW (ref 13.0–17.0)
MCH: 26.7 pg (ref 26.0–34.0)
MCHC: 33.2 g/dL (ref 30.0–36.0)
MCV: 80.3 fL (ref 80.0–100.0)
Platelets: 274 K/uL (ref 150–400)
RBC: 3.86 MIL/uL — ABNORMAL LOW (ref 4.22–5.81)
RDW: 15.2 % (ref 11.5–15.5)
WBC: 9.3 K/uL (ref 4.0–10.5)
nRBC: 0 % (ref 0.0–0.2)

## 2024-07-02 LAB — BASIC METABOLIC PANEL WITH GFR
Anion gap: 6 (ref 5–15)
BUN: 30 mg/dL — ABNORMAL HIGH (ref 8–23)
CO2: 27 mmol/L (ref 22–32)
Calcium: 8.3 mg/dL — ABNORMAL LOW (ref 8.9–10.3)
Chloride: 101 mmol/L (ref 98–111)
Creatinine, Ser: 1.38 mg/dL — ABNORMAL HIGH (ref 0.61–1.24)
GFR, Estimated: 57 mL/min — ABNORMAL LOW (ref >60)
Glucose, Bld: 198 mg/dL — ABNORMAL HIGH (ref 70–99)
Potassium: 4.4 mmol/L (ref 3.5–5.1)
Sodium: 134 mmol/L — ABNORMAL LOW (ref 135–145)

## 2024-07-02 MED ORDER — TERBINAFINE HCL 250 MG PO TABS
250.0000 mg | ORAL_TABLET | Freq: Every day | ORAL | Status: DC
  Administered 2024-07-02 – 2024-07-04 (×3): 250 mg via ORAL
  Filled 2024-07-02 (×3): qty 1

## 2024-07-02 MED ORDER — KETOCONAZOLE 2 % EX SHAM
MEDICATED_SHAMPOO | CUTANEOUS | Status: DC
  Filled 2024-07-02: qty 120

## 2024-07-02 NOTE — Progress Notes (Signed)
 Physical Therapy Treatment Patient Details Name: [name] MRN: [id number] DOB: 01-27-1961 Today's Date: 07/02/2024   History of Present Illness Pt is a 63 y.o. male presenting 12/05 with AMS and possible infection. While in ED, pt with witnessed seizure like activity. EEG with encephalopathy; no seizures recorded. PMH: HTN, HLD, DM, hydrocephalus, TIA, cervical stenosis with cord compression s.p decompressive cervical laminectomy and medial facetectomy with residual quadriparesis, neuropathy, diastolic CHF, cocaine abuse, alcohol abuse, tobacco abuse.    PT Comments  Pt tolerated treatment well today. Co-treat with OT. Pt today able to lateral scoot transfer into chair with +2 Max A. No change in DC/DME recs at this time. PT will continue to follow.     If plan is discharge home, recommend the following: Two people to help with walking and/or transfers;A lot of help with bathing/dressing/bathroom;Assistance with cooking/housework;Assistance with feeding;Direct supervision/assist for medications management;Direct supervision/assist for financial management;Assist for transportation;Help with stairs or ramp for entrance;Supervision due to cognitive status   Can travel by private vehicle     No  Equipment Recommendations  Hospital bed;Hoyer lift;Wheelchair (measurements PT);Wheelchair cushion (measurements PT)    Recommendations for Other Services       Precautions / Restrictions Precautions Precautions: Fall Recall of Precautions/Restrictions: Impaired Restrictions Weight Bearing Restrictions Per Provider Order: No     Mobility  Bed Mobility Overal bed mobility: Needs Assistance Bed Mobility: Supine to Sit     Supine to sit: +2 for physical assistance, +2 for safety/equipment, Mod assist          Transfers Overall transfer level: Needs assistance Equipment used: None Transfers: Bed to chair/wheelchair/BSC, Sit to/from Stand Sit to Stand: +2 physical assistance,  Total assist          Lateral/Scoot Transfers: +2 physical assistance, Max assist General transfer comment: lateral scoot toward [facility] with assist for truncal support as well as weight shift forward and scoot. again, limited effort on behalf of pt. Cues for head hip relationship.    Ambulation/Gait               General Gait Details: nonambulatory at baseline   Stairs             Wheelchair Mobility     Tilt Bed    Modified Rankin (Stroke Patients Only)       Balance Overall balance assessment: Needs assistance Sitting-balance support: Single extremity supported, Bilateral upper extremity supported, Feet supported Sitting balance-Leahy Scale: Fair                                      Hotel Manager: Impaired Factors Affecting Communication: Hearing impaired  Cognition Arousal: Alert Behavior During Therapy: Flat affect, Impulsive                             Following commands: Impaired Following commands impaired: Only follows one step commands consistently    Cueing Cueing Techniques: Verbal cues, Gestural cues, Tactile cues  Exercises      General Comments General comments (skin integrity, edema, etc.): Sister present and support      Pertinent Vitals/Pain Pain Assessment Pain Assessment: No/denies pain    Home Living                          Prior Function  PT Goals (current goals can now be found in the care plan section) Progress towards PT goals: Progressing toward goals    Frequency    Min 1X/week      PT Plan      Co-evaluation              AM-PAC PT 6 Clicks Mobility   Outcome Measure  Help needed turning from your back to your side while in a flat bed without using bedrails?: A Lot Help needed moving from lying on your back to sitting on the side of a flat bed without using bedrails?: A Lot Help needed moving to and from a bed to a  chair (including a wheelchair)?: Total Help needed standing up from a chair using your arms (e.g., wheelchair or bedside chair)?: Total Help needed to walk in hospital room?: Total Help needed climbing 3-5 steps with a railing? : Total 6 Click Score: 8    End of Session Equipment Utilized During Treatment: Other (comment) (Bed pad) Activity Tolerance: Patient tolerated treatment well Patient left: in chair;with call bell/phone within reach;with chair alarm set;with family/visitor present Nurse Communication: Mobility status;Need for lift equipment PT Visit Diagnosis: Other abnormalities of gait and mobility (R26.89)     Time: 8589-8573 PT Time Calculation (min) (ACUTE ONLY): 16 min  Charges:    $Therapeutic Activity: 8-22 mins PT General Charges $$ ACUTE PT VISIT: 1 Visit                     Sueellen NOVAK, PT, DPT Acute Rehab Services [phone]    David Towson 07/02/2024, 3:41 PM

## 2024-07-02 NOTE — TOC Progression Note (Addendum)
 Transition of Care [facility]) - Progression Note    Patient Details  Name: TORRE SCHAUMBURG MRN: [id number] Date of Birth: 1960/11/13  Transition of Care [facility]) CM/SW Contact  [name], RN Phone Number: 07/02/2024, 12:57 PM  Clinical Narrative:     Both of nieces choices for SNF: [facility] have declined to offer a bed. CM has updated the Niece and she is looking at other SNF options. IP Care management following.  1545: Niece picked either [facility] or [facility]. Awaiting bed availability and if they can offer.   Expected Discharge Plan: Skilled Nursing Facility Barriers to Discharge: English As A Second Language Teacher, Continued Medical Work up, SNF Pending bed offer               Expected Discharge Plan and Services In-house Referral: Clinical Social Work Discharge Planning Services: CM Consult Post Acute Care Choice: Skilled Nursing Facility Living arrangements for the past 2 months: Apartment                                       Social Drivers of Health (SDOH) Interventions SDOH Screenings   Food Insecurity: Unknown (10/24/2023)   Received from [facility]  Utilities: Patient Unable To Answer (06/27/2024)  Alcohol Screen: Low Risk  (09/20/2019)  Depression (PHQ2-9): Low Risk  (11/11/2019)  Financial Resource Strain: Medium Risk (09/20/2019)  Physical Activity: Inactive (09/20/2019)  Social Connections: Patient Unable To Answer (06/27/2024)  Stress: No Stress Concern Present (09/20/2019)  Tobacco Use: High Risk (01/02/2024)   Received from [facility]    Readmission Risk Interventions     No data to display

## 2024-07-02 NOTE — Progress Notes (Signed)
 PROGRESS NOTE    [name]  FMW:[id number] DOB: August 11, 1960 DOA: 06/27/2024 PCP: Venson Candis CROME, NP  Chief Complaint  Patient presents with   Altered Mental Status    Brief Narrative:   63 y.o. male with medical history significant of hypertension, hyperlipidemia, diabetes mellitus, hydrocephalus, TIA cervical stenosis with cord compression status post decompressive cervical laminectomy and medial facetectomy with residual quadriparesis, diabetic neuropathy, chronic diastolic CHF, cocaine abuse, alcohol abuse, tobacco abuse presents for altered mental status and possible infection.  CT scan of the head noted no acute abnormality and stable ventriculomegaly as well as note of stable areas of lobulated dermal thickening and scattered sebaceous cyst.  CT scan of the cervical spine did not note any acute abnormality.  While in the ED and patient was witnessed having seizure activity.    Neurology was consulted.  He's been started on keppra .  Stable for discharge to SNF at this time.   Assessment & Plan:   Principal Problem:   New onset seizure (HCC) Active Problems:   Constipation   Complete heart block (HCC)   Presence of permanent cardiac pacemaker   History of spinal cord injury   Sebaceous cyst   Controlled type 2 diabetes mellitus without complication, without long-term current use of insulin  (HCC)   Diastolic congestive heart failure (HCC)   History of substance abuse (HCC)  New onset seizure -Presented after having seizure -CT head showed no acute abnormality and stable ventriculomegaly -Neurology consulted, started on Keppra  -LTM EEG without seizures or epileptiform discharges -neurology recommending keppra  - b12 (low normal - follow MMA and supplement), folate (wnl), TSH (wnl), RPR (nonreactive), b1 (canceled due to insufficient sample, will reorder)   Per [place] statutes, patients with seizures are not allowed to drive until  they have been seizure-free  for six months. Use caution when using heavy equipment or power tools. Avoid working on ladders or at heights. Take showers instead of baths. Ensure the water  temperature is not too high on the home water  heater. Do not go swimming alone. When caring for infants or small children, sit down when holding, feeding, or changing them to minimize risk of injury to the child in the event you have Idelia Caudell seizure. Also, Maintain good sleep hygiene. Avoid alcohol.  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   History of cervical stenosis with cord compression As Donoven Pett result of this back in 2021 patient is bedbound. - Stable - 12/5 CT with possible loosening of R C3 pedicle screw - follow outpatient with nsgy   Diabetes mellitus type 2 without long-term use of insulin  A1c 7.1 Currently on metformin  twice daily. SSI for now   Diastolic congestive heart failure Patient appears euvolemic at this time.  Last echocardiogram noted EF to be 65 to 70%. - Continue to monitor   History of polysubstance abuse Patient with Beck Cofer prior history of cocaine, alcohol, and tobacco use.  Currently noted to only smoke Doyt Castellana cigarette per day on average.  Sober from drug and alcohol least 4  years. - UDS negative  Skin Lesions Kerions?? Sebaceous Cysts noted on Head CT Niece showed me image of his head from [facility] few weeks ago - ? Kerion, had several boggy appearing nodules to back of scalp.  Not sure if I can do koh prep inpatient, ordered fungal smear (pending) Would prefer definitive diagnosis before starting several week course  of antifungal, but since derm follow up will take likely several months, will plan to treat empirically (terbinafine  x4 weeks, ketoconazole  shampoo twice weekly).  LFT's ok.  His lesions under his chin/in his beard, suspect represent folliculitis.  Also suspect lesions in groin are folliculitis as well  - not many noted on my exam Shatika Grinnell few days ago.   Dermatology referral placed   Plan is for SNF    DVT prophylaxis: lovenox  Code Status: full Family Communication: none Disposition:   Status is: Inpatient Remains inpatient appropriate because: need for continued inpatient care   Consultants:  neurology  Procedures:  EEG  Antimicrobials:  Anti-infectives (From admission, onward)    None       Subjective: No complaints  Objective: Vitals:   07/01/24 2348 07/02/24 0427 07/02/24 0848 07/02/24 1147  BP: 123/64 (!) 124/57 (!) 113/49 (!) 105/57  Pulse: 78 73 74 70  Resp: 18  18 16   Temp: 98.2 F (36.8 C) 98.7 F (37.1 C) 98 F (36.7 C) 98.4 F (36.9 C)  TempSrc: Oral Oral  Oral  SpO2: 100% 99% 100% 98%    Intake/Output Summary (Last 24 hours) at 07/02/2024 1335 Last data filed at 07/02/2024 0900 Gross per 24 hour  Intake 1200 ml  Output 1650 ml  Net -450 ml   There were no vitals filed for this visit.  Examination:  General: No acute distress. Cardiovascular: RRR Lungs: unlabored Neurological: Alert and oriented 3. Moves all extremities 4 with equal strength. Cranial nerves II through XII grossly intact. Extremities: No clubbing or cyanosis. No edema.   Data Reviewed: I have personally reviewed following labs and imaging studies  CBC: Recent Labs  Lab 06/27/24 1515 06/28/24 0157 07/01/24 0509  WBC 9.4 7.8 10.5  NEUTROABS 6.9  --  7.5  HGB 12.1* 10.2* 10.8*  HCT 39.1 31.9* 33.6*  MCV 82.3 80.6 79.8*  PLT 300 271 279    Basic Metabolic Panel: Recent Labs  Lab 06/27/24 1515 06/28/24 0157 07/01/24 0509  NA 144 141 135  K 5.0 4.1 4.4  CL 108 108 103  CO2 27 24 19*  GLUCOSE 147* 118* 176*  BUN 31* 30* 47*  CREATININE 1.40* 1.47* 1.74*  CALCIUM  9.3 9.0 8.4*  MG  --   --  2.5*  PHOS  --   --  4.0    GFR: CrCl cannot be calculated (Unknown ideal weight.).  Liver Function Tests: Recent Labs  Lab 06/27/24 1515 07/01/24 0509  AST 19  14*  ALT 15 11  ALKPHOS 79 60  BILITOT 1.0 0.6  PROT 7.7 6.2*  ALBUMIN  3.7 2.6*    CBG: Recent Labs  Lab 06/27/24 1350 06/27/24 1518  GLUCAP 171* 159*     No results found for this or any previous visit (from the past 240 hours).       Radiology Studies: No results found.       Scheduled Meds:  vitamin B-12  1,000 mcg Oral Daily   enoxaparin  (LOVENOX ) injection  40 mg Subcutaneous Q24H   feeding supplement  237 mL Oral BID BM   ketoconazole    Topical QHS   levETIRAcetam   500 mg Oral BID   senna-docusate  1 tablet Oral BID   sodium chloride  flush  3 mL Intravenous Q12H   thiamine   100 mg Oral Daily   Continuous Infusions:  lactated ringers  75 mL/hr at 07/02/24 0447     LOS: 4 days    Time spent: over 30 min  Meliton Monte, MD [facility]   To contact the attending provider between 7A-7P or the covering provider during after hours 7P-7A, please log into the web site [email or web address] and access using universal [place] password for that web site. If you do not have the password, please call the hospital operator.  07/02/2024, 1:35 PM

## 2024-07-02 NOTE — Plan of Care (Signed)
°  Problem: Health Behavior/Discharge Planning: Goal: Ability to manage health-related needs will improve Outcome: Progressing   Problem: Clinical Measurements: Goal: Will remain free from infection Outcome: Progressing   Problem: Clinical Measurements: Goal: Respiratory complications will improve Outcome: Progressing   Problem: Activity: Goal: Risk for activity intolerance will decrease Outcome: Progressing   Problem: Nutrition: Goal: Adequate nutrition will be maintained Outcome: Progressing   Problem: Elimination: Goal: Will not experience complications related to bowel motility Outcome: Progressing   Problem: Pain Managment: Goal: General experience of comfort will improve and/or be controlled Outcome: Progressing   Problem: Safety: Goal: Ability to remain free from injury will improve Outcome: Progressing   Problem: Skin Integrity: Goal: Risk for impaired skin integrity will decrease Outcome: Progressing

## 2024-07-02 NOTE — Progress Notes (Signed)
 Occupational Therapy Treatment Patient Details Name: [name] MRN: [id number] DOB: February 19, 1961 Today's Date: 07/02/2024   History of present illness Pt is a 63 y.o. male presenting 12/05 with AMS and possible infection. While in ED, pt with witnessed seizure like activity. EEG with encephalopathy; no seizures recorded. PMH: HTN, HLD, DM, hydrocephalus, TIA, cervical stenosis with cord compression s.p decompressive cervical laminectomy and medial facetectomy with residual quadriparesis, neuropathy, diastolic CHF, cocaine abuse, alcohol abuse, tobacco abuse.   OT comments  Pt progressing toward established OT goals. Pt with greater initiation this session as well as motivation for mobility. Pt sister present, supportive, and encouraging. Pt needing up to mod A +2 for bed mobility and max +2 for lateral scoot to chair. Pt with good hand placement, but poor understanding of head/hips relationship to optimize technique/ease of scoot. Reorienting pt this session, but needing errorless learning for any carryover. Will continue to follow. Chair alarm on, sister present, and sister to notify RN if leaving to optimize safety of pt with first trial sitting up in chair. Pt with good tolerance ~5 mins prior to OT departure.       If plan is discharge home, recommend the following:  Two people to help with walking and/or transfers;Two people to help with bathing/dressing/bathroom;Assistance with cooking/housework;Assistance with feeding;Direct supervision/assist for medications management;Direct supervision/assist for financial management;Assist for transportation;Help with stairs or ramp for entrance   Equipment Recommendations  Hoyer lift;Hospital bed;Other (comment) (drop arm BSC)    Recommendations for Other Services      Precautions / Restrictions Precautions Precautions: Fall Recall of Precautions/Restrictions: Impaired Restrictions Weight Bearing Restrictions Per Provider Order: No        Mobility Bed Mobility Overal bed mobility: Needs Assistance Bed Mobility: Supine to Sit     Supine to sit: +2 for physical assistance, +2 for safety/equipment, Mod assist     General bed mobility comments: for initiation, trunk, and bringing hips out toward EOB    Transfers Overall transfer level: Needs assistance Equipment used: None Transfers: Bed to chair/wheelchair/BSC, Sit to/from Stand Sit to Stand: +2 physical assistance, Total assist          Lateral/Scoot Transfers: +2 physical assistance, Max assist General transfer comment: lateral scoot toward [facility] with assist for truncal support as well as weight shift forward and scoot. again, limited effort on behalf of pt. Cues for head hip relationship.     Balance Overall balance assessment: Needs assistance Sitting-balance support: Single extremity supported, Bilateral upper extremity supported, Feet supported Sitting balance-Leahy Scale: Fair                                     ADL either performed or assessed with clinical judgement   ADL Overall ADL's : Needs assistance/impaired     Grooming: Minimal assistance;Sitting                   Toilet Transfer: Maximal assistance;+2 for physical assistance;+2 for safety/equipment Toilet Transfer Details (indicate cue type and reason): lateral scoot simulated to chair                Extremity/Trunk Assessment Upper Extremity Assessment Upper Extremity Assessment: Generalized weakness   Lower Extremity Assessment Lower Extremity Assessment: Defer to PT evaluation        Vision   Vision Assessment?: Vision impaired- to be further tested in functional context Additional Comments: denies changes not formally assessed  Perception     Praxis     Communication Communication Communication: Impaired Factors Affecting Communication: Hearing impaired   Cognition Arousal: Alert Behavior During Therapy: Flat affect, Impulsive Cognition:  Cognition impaired   Orientation impairments: Time, Situation Awareness: Online awareness impaired, Intellectual awareness impaired Memory impairment (select all impairments): Short-term memory, Working civil service fast streamer, Conservation officer, historic buildings Attention impairment (select first level of impairment): Sustained attention Executive functioning impairment (select all impairments): Organization, Sequencing, Problem solving, Initiation, Reasoning OT - Cognition Comments: poor insight into current functional level stating multiple times he can get up and walk out of here any time he wants                 Following commands: Impaired Following commands impaired: Only follows one step commands consistently      Cueing   Cueing Techniques: Verbal cues, Gestural cues, Tactile cues  Exercises      Shoulder Instructions       General Comments sister present and supportive    Pertinent Vitals/ Pain       Pain Assessment Pain Assessment: No/denies pain  Home Living                                          Prior Functioning/Environment              Frequency  Min 2X/week        Progress Toward Goals  OT Goals(current goals can now be found in the care plan section)  Progress towards OT goals: Progressing toward goals  Acute Rehab OT Goals OT Goal Formulation: With patient/family Time For Goal Achievement: 07/14/24 Potential to Achieve Goals: Good ADL Goals Pt Will Perform Grooming: with set-up;sitting;with supervision Pt Will Perform Upper Body Bathing: with supervision;sitting Pt Will Transfer to Toilet: with mod assist;with transfer board Pt/caregiver will Perform Home Exercise Program: Left upper extremity;Increased strength;Both right and left upper extremity Additional ADL Goal #1: pt will perform bed mobility with min A as a precursor to ADL  Plan      Co-evaluation                 AM-PAC OT 6 Clicks Daily Activity     Outcome  Measure   Help from another person eating meals?: A Little Help from another person taking care of personal grooming?: A Lot Help from another person toileting, which includes using toliet, bedpan, or urinal?: Total Help from another person bathing (including washing, rinsing, drying)?: A Lot Help from another person to put on and taking off regular upper body clothing?: A Lot Help from another person to put on and taking off regular lower body clothing?: Total 6 Click Score: 11    End of Session    OT Visit Diagnosis: Unsteadiness on feet (R26.81);Muscle weakness (generalized) (M62.81);Other symptoms and signs involving cognitive function   Activity Tolerance Patient tolerated treatment well   Patient Left in chair;with call bell/phone within reach;with chair alarm set;with family/visitor present (family member to notify RN if leaving)   Nurse Communication Mobility status        Time: 8595-8572 OT Time Calculation (min): 23 min  Charges: OT General Charges $OT Visit: 1 Visit OT Treatments $Therapeutic Activity: 8-22 mins  Elma JONETTA Penner, OTD, OTR/L [facility] Acute Rehabilitation Office: [phone]   Elma JONETTA Penner 07/02/2024, 4:51 PM

## 2024-07-03 LAB — METHYLMALONIC ACID, SERUM: Methylmalonic Acid, Quantitative: 525 nmol/L — ABNORMAL HIGH (ref 0–378)

## 2024-07-03 LAB — FUNGAL STAIN REFLEX

## 2024-07-03 LAB — FUNGUS STAIN

## 2024-07-03 MED ORDER — LORAZEPAM 1 MG PO TABS: 2.0000 mg | ORAL_TABLET | Freq: Four times a day (QID) | ORAL | Status: DC | PRN

## 2024-07-03 NOTE — TOC Progression Note (Addendum)
 Transition of Care [facility]) - Progression Note    Patient Details  Name: [name] MRN: [id number] Date of Birth: Jun 23, 1961  [facility]) CM/SW Contact  [name], RN Phone Number: 07/03/2024, 1:50 PM  Clinical Narrative:     Pt has been offered a bed at [facility]. This is his only offer. Niece, Christobal has agreed to [facility]. CM has sent to [facility] for insurance approval through [facility]. IP Care management following.  1504: pt has been approved: Plan Auth ID [id number]. 07/03/24-07/07/24. NRD 12/15  Will d/c to [facility].   Expected Discharge Plan: Skilled Nursing Facility Barriers to Discharge: English As A Second Language Teacher, Continued Medical Work up, SNF Pending bed offer               Expected Discharge Plan and Services In-house Referral: Clinical Social Work Discharge Planning Services: CM Consult Post Acute Care Choice: Skilled Nursing Facility Living arrangements for the past 2 months: Apartment                                       Social Drivers of Health (SDOH) Interventions SDOH Screenings   Food Insecurity: Unknown (10/24/2023)   Received from [facility]  Utilities: Patient Unable To Answer (06/27/2024)  Social Connections: Patient Unable To Answer (06/27/2024)  Tobacco Use: High Risk (01/02/2024)   Received from [facility]    Readmission Risk Interventions     No data to display

## 2024-07-03 NOTE — Plan of Care (Signed)
 Able to follow all the commands, assist self when moving in bed and able to take feed by self.  Problem: Education: Goal: Knowledge of General Education information will improve Description: Including pain rating scale, medication(s)/side effects and non-pharmacologic comfort measures Outcome: Progressing   Problem: Clinical Measurements: Goal: Will remain free from infection Outcome: Progressing   Problem: Nutrition: Goal: Adequate nutrition will be maintained Outcome: Progressing   Problem: Activity: Goal: Risk for activity intolerance will decrease Outcome: Progressing   Problem: Coping: Goal: Level of anxiety will decrease Outcome: Progressing   Problem: Safety: Goal: Ability to remain free from injury will improve Outcome: Progressing   Problem: Pain Managment: Goal: General experience of comfort will improve and/or be controlled Outcome: Progressing   Problem: Skin Integrity: Goal: Risk for impaired skin integrity will decrease Outcome: Progressing

## 2024-07-03 NOTE — Progress Notes (Signed)
 [facility]  PROGRESS NOTE  [name] FMW:[id number] DOB: 29-Jul-1960 DOA: 06/27/2024 PCP: Curtis Candis CROME, NP   Brief HPI:    63 y.o. 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  CT scan of the cervical spine did not note any acute abnormality.  While in the ED and patient was witnessed having seizure activity.  Neurology had been formally consulted and recommended loading patient with Keppra  2500 mg IV.  Neurology ordered EEG.   Assessment/Plan:   New onset seizure -Presented after having seizure -CT head showed no acute abnormality and stable ventriculomegaly -Neurology consulted, started on Keppra  -LTM EEG did not show seizure or epileptiform discharges - B12 level 241, MMA 525; confirming B12 deficiency - RPR nonreactive - Vitamin B1 level ordered, result is pending  Per [place] statutes, patients with seizures are not allowed to drive until they have been seizure-free for six months. Use caution when using heavy equipment or power tools. Avoid working on ladders or at heights. Take showers instead of baths. Ensure the water  temperature is not too high on the home water  heater. Do not go swimming alone. When caring for infants or small children, sit down when holding, feeding, or changing them to minimize risk of injury to the child in the event you have a seizure. Also, Maintain good sleep hygiene. Avoid alcohol.   B12 deficiency - B12 level 241, MMA  525 -Confirms B12 deficiency -Continue B12 1000 mcg daily   Constipation Present on admission.  Last bowel movement was reportedly a couple days ago.   -X-rays of the abdomen revealed significant amount of fecal retention consistent with constipation. 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   History of cervical stenosis with cord compression As a result of this back in 2021 patient is bedbound. - Stable   Sebaceous cyst Patient noted to have multiple sebaceous cyst noted on the posterior scalp, neck, and inguinal region.  Currently no significant signs orerythema to suggest acute infection.  Family note patient has had multiple rounds of antibiotics without improvement.  Review of records show that this has been ongoing issue dating back to 58.  Records note prior referral to dermatology back in 2017. - Referral placed for dermatology in the outpatient setting -Concern for kerion, fungal smear is negative -Started on terbinafine  empirically for 4 weeks, ketoconazole  shampoo twice weekly   Diabetes mellitus type 2 without long-term use of insulin  On admission glucose noted to be 147.   Patient previously had been on insulin , but was recommended to stop after leaving facility and returning home.   Currently on metformin  twice daily. - Hypoglycemic protocols - Hemoglobin A1c 7.1 - Hold metformin    Diastolic congestive heart failure Patient appears euvolemic at this time.  Last echocardiogram noted EF to be 65 to 70%. - Continue to monitor   History of polysubstance abuse Patient with a prior history of cocaine, alcohol, and tobacco use.  Currently noted to only smoke a cigarette per day on average.  Sober  from drug and alcohol least 4  years. - UDS negative        DVT prophylaxis: Lovenox   Medications     vitamin B-12  1,000 mcg Oral Daily   enoxaparin  (LOVENOX )  injection  40 mg Subcutaneous Q24H   feeding supplement  237 mL Oral BID BM   ketoconazole    Topical Once per day on Monday Thursday   levETIRAcetam   500 mg Oral BID   senna-docusate  1 tablet Oral BID   sodium chloride  flush  3 mL Intravenous Q12H   terbinafine   250 mg Oral Daily   thiamine   100 mg Oral Daily     Data Reviewed:   CBG:  Recent Labs  Lab 06/27/24 1350 06/27/24 1518  GLUCAP 171* 159*    SpO2: 100 %    Vitals:   07/03/24 0024 07/03/24 0357 07/03/24 0722 07/03/24 1200  BP: 130/62 120/65 (!) 111/55 (!) 120/56  Pulse: 72 77 69 78  Resp: 18  18 19   Temp: 98.6 F (37 C) 98.4 F (36.9 C) 99.3 F (37.4 C) 98.9 F (37.2 C)  TempSrc: Oral Oral Oral Oral  SpO2: 99% 98% 100% 100%      Data Reviewed:  Basic Metabolic Panel: Recent Labs  Lab 06/27/24 1515 06/28/24 0157 07/01/24 0509 07/02/24 1409  NA 144 141 135 134*  K 5.0 4.1 4.4 4.4  CL 108 108 103 101  CO2 27 24 19* 27  GLUCOSE 147* 118* 176* 198*  BUN 31* 30* 47* 30*  CREATININE 1.40* 1.47* 1.74* 1.38*  CALCIUM  9.3 9.0 8.4* 8.3*  MG  --   --  2.5*  --   PHOS  --   --  4.0  --     CBC: Recent Labs  Lab 06/27/24 1515 06/28/24 0157 07/01/24 0509 07/02/24 1409  WBC 9.4 7.8 10.5 9.3  NEUTROABS 6.9  --  7.5  --   HGB 12.1* 10.2* 10.8* 10.3*  HCT 39.1 31.9* 33.6* 31.0*  MCV 82.3 80.6 79.8* 80.3  PLT 300 271 279 274    LFT Recent Labs  Lab 06/27/24 1515 07/01/24 0509  AST 19 14*  ALT 15 11  ALKPHOS 79 60  BILITOT 1.0 0.6  PROT 7.7 6.2*  ALBUMIN  3.7 2.6*     Antibiotics: Anti-infectives (From admission, onward)    Start     Dose/Rate Route Frequency Ordered Stop   07/02/24 1430  terbinafine  (LAMISIL ) tablet 250 mg        250 mg Oral Daily 07/02/24 1340 07/30/24 0959        CONSULTS neurology  Code Status: Full code  Family Communication: Discussed with patient's sister at bedside     Subjective   Denies any complaints   Objective    Physical  Examination:  Appears in no acute distress Alert, oriented x 3, no focal deficit Lungs are clear to auscultation bilaterally Abdomen is soft, nontender Extremities no edema            [name] S Aqil [name]   [facility] If 7PM-7AM, please contact night-coverage at [email or web address], Office  [phone]   07/03/2024, 3:31 PM  LOS: 5 days

## 2024-07-03 NOTE — Plan of Care (Addendum)
 Pt refused for blood draw, MD made aware.  Problem: Health Behavior/Discharge Planning: Goal: Ability to manage health-related needs will improve Outcome: Progressing   Problem: Clinical Measurements: Goal: Will remain free from infection Outcome: Progressing   Problem: Activity: Goal: Risk for activity intolerance will decrease Outcome: Progressing   Problem: Nutrition: Goal: Adequate nutrition will be maintained Outcome: Progressing   Problem: Elimination: Goal: Will not experience complications related to bowel motility Outcome: Progressing   Problem: Safety: Goal: Ability to remain free from injury will improve Outcome: Progressing   Problem: Skin Integrity: Goal: Risk for impaired skin integrity will decrease Outcome: Progressing

## 2024-07-04 MED ORDER — VITAMIN B-1 100 MG PO TABS: 100.0000 mg | ORAL_TABLET | Freq: Every day | ORAL | Status: AC

## 2024-07-04 MED ORDER — INSULIN ASPART 100 UNIT/ML ~~LOC~~ SOLN: 0.0000 [IU] | Freq: Three times a day (TID) | SUBCUTANEOUS | 11 refills | Status: AC

## 2024-07-04 MED ORDER — CYANOCOBALAMIN 1000 MCG PO TABS: 1000.0000 ug | ORAL_TABLET | Freq: Every day | ORAL | Status: AC

## 2024-07-04 MED ORDER — LEVETIRACETAM 500 MG PO TABS: 500.0000 mg | ORAL_TABLET | Freq: Two times a day (BID) | ORAL | Status: AC

## 2024-07-04 MED ORDER — TERBINAFINE HCL 250 MG PO TABS: 250.0000 mg | ORAL_TABLET | Freq: Every day | ORAL | 0 refills | Status: AC

## 2024-07-04 MED ORDER — INSULIN GLARGINE 100 UNIT/ML ~~LOC~~ SOLN: 10.0000 [IU] | Freq: Every day | SUBCUTANEOUS | Status: AC

## 2024-07-04 MED ORDER — SENNOSIDES-DOCUSATE SODIUM 8.6-50 MG PO TABS: 1.0000 | ORAL_TABLET | Freq: Two times a day (BID) | ORAL | Status: AC

## 2024-07-04 NOTE — Plan of Care (Signed)

## 2024-07-04 NOTE — Progress Notes (Signed)
 Discharged. Transported by [facility].

## 2024-07-04 NOTE — TOC Transition Note (Signed)
 Transition of Care [facility]) - Discharge Note   Patient Details  Name: [name] MRN: [id number] Date of Birth: 05-29-1961  [facility] CM/SW Contact:  [name], RN Phone Number: 07/04/2024, 10:41 AM   Clinical Narrative:     Pt has received auth and bed is available at [facility]. Pt will transport via [facility].   Room: 118 Number for report: [phone]  Final next level of care: Skilled Nursing Facility Barriers to Discharge: No Barriers Identified   Patient Goals and CMS Choice   CMS Medicare.gov Compare Post Acute Care list provided to:: Patient Represenative (must comment) Choice offered to / list presented to :  (niece)      Discharge Placement              Patient chooses bed at:  [facility]) Patient to be transferred to facility by: [facility] Name of family member notified: [place] Patient and family notified of of transfer: 07/04/24  Discharge Plan and Services Additional resources added to the After Visit Summary for   In-house Referral: Clinical Social Work Discharge Planning Services: CM Consult Post Acute Care Choice: Skilled Nursing Facility                               Social Drivers of Health (SDOH) Interventions SDOH Screenings   Food Insecurity: Unknown (10/24/2023)   Received from [facility]  Utilities: Patient Unable To Answer (06/27/2024)  Social Connections: Patient Unable To Answer (06/27/2024)  Tobacco Use: High Risk (01/02/2024)   Received from [facility]     Readmission Risk Interventions     No data to display

## 2024-07-04 NOTE — Discharge Summary (Signed)
 Physician Discharge Summary   Patient: [name] MRN: [id number] DOB: 03-Dec-1960  Admit date:     06/27/2024  Discharge date: 07/04/2024  Discharge Physician: Sabas GORMAN Brod   PCP: Venson Candis CROME, NP   Recommendations at discharge:   Take terbinafine  250 mg daily for 26 days Take Keppra  500 mg p.o. twice daily Check BMP in 3 days Follow-up [facility] in 4 weeks Follow vitamin 1 level as outpatient Check B12 level in 3 months  Discharge Diagnoses: Principal Problem:   New onset seizure (HCC) Active Problems:   Constipation   Complete heart block (HCC)   Presence of permanent cardiac pacemaker   History of spinal cord injury   Sebaceous cyst   Controlled type 2 diabetes mellitus without complication, without long-term current use of insulin  (HCC)   Diastolic congestive heart failure (HCC)   History of substance abuse (HCC)  Resolved Problems:   * No resolved hospital problems. *  Hospital Course: 63 y.o. 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  CT scan of the cervical spine did not note any acute abnormality.  While in the ED and patient was witnessed having seizure activity.  Neurology had been formally consulted and recommended loading patient with Keppra  2500 mg IV.  Neurology ordered EEG.   Assessment and Plan:  New onset seizure -Presented after having seizure -CT head showed no acute abnormality and stable ventriculomegaly -Neurology consulted,  started on Keppra  -LTM EEG did not show seizure or epileptiform discharges - B12 level 241, MMA 525; confirming B12 deficiency - RPR nonreactive - Vitamin B1 level ordered, result is pending - Started on vitamin B1 100 mg p.o. daily   Per [place] statutes, patients with seizures are not allowed to drive until they have been seizure-free for six months. Use caution when using heavy equipment or power tools. Avoid working on ladders or at heights. Take showers instead of baths. Ensure the water  temperature is not too high on the home water  heater. Do not go swimming alone. When caring for infants or small children, sit down when holding, feeding, or changing them to minimize risk of injury to the child in the event you have a seizure. Also, Maintain good sleep hygiene. Avoid alcohol.    B12 deficiency - B12 level 241, MMA 525 -Confirms B12 deficiency -Continue B12 1000 mcg daily -Check B12 level in 3 months   Constipation Present on admission.  Last bowel movement was reportedly a couple days ago.   -X-rays of the abdomen revealed significant amount of fecal retention consistent with constipation. - Senokot-S twice daily   Heart block S/p permanent pacemaker Patient had cardiac pacemaker placed for high-grade AV heart block back in 06/2022.   History of cervical stenosis with cord compression As a result of this back in 2021 patient is bedbound. - Stable   Sebaceous cyst Patient noted to have multiple sebaceous cyst noted on the posterior scalp, neck, and inguinal region.  Currently no significant signs orerythema to suggest acute infection.  Family note patient has had multiple rounds of antibiotics without improvement.  Review of  records show that this has been ongoing issue dating back to 65.  Records note prior referral to dermatology back in 2017. - Referral placed for dermatology in the outpatient setting -Concern for kerion, fungal smear is negative -Started on  terbinafine  empirically for 4 weeks, stop terbinafine  after 26 days. -Stop ketoconazole  shampoo as it can increase risk of seizures   Diabetes mellitus type 2 without long-term use of insulin  On admission glucose noted to be 147.   Patient previously had been on insulin , but was recommended to stop after leaving facility and returning home.  Will discontinue metformin  due to worsening renal function - Hemoglobin A1c 7.1 - Start low-dose Lantus  10 units subcu daily - Start sliding scale insulin  with NovoLog     Diastolic congestive heart failure .  Last echocardiogram noted EF to be 65 to 70%.  Grade 2 diastolic dysfunction - Takes Lasix  40 mg p.o. daily at home - Will discharge on Lasix  40 mg daily   History of polysubstance abuse Patient with a prior history of cocaine, alcohol, and tobacco use.  Currently noted to only smoke a cigarette per day on average.  Sober from drug and alcohol least 4  years. - UDS negative        Consultants: Neurology Procedures performed:  Disposition: Home Diet recommendation:  Carb modified diet DISCHARGE MEDICATION: Allergies as of 07/04/2024   No Known Allergies      Medication List     STOP taking these medications    cephALEXin  500 MG capsule Commonly known as: KEFLEX    enoxaparin  40 MG/0.4ML injection Commonly known as: LOVENOX    hydrALAZINE  25 MG tablet Commonly known as: APRESOLINE    labetalol  100 MG tablet Commonly known as: NORMODYNE    metFORMIN  500 MG tablet Commonly known as: GLUCOPHAGE    naproxen  375 MG tablet Commonly known as: NAPROSYN    senna 8.6 MG Tabs tablet Commonly known as: SENOKOT       TAKE these medications    acetaminophen  650 MG CR tablet Commonly known as: Tylenol  8 Hour Take 1 tablet (650 mg total) by mouth every 8 (eight) hours as needed for pain or fever.   Aspir-Low 81 MG tablet Generic drug: aspirin  EC TAKE 1 TABLET (81 MG TOTAL) BY MOUTH DAILY. What changed: how much to take    atorvastatin  40 MG tablet Commonly known as: LIPITOR  Take 1 tablet (40 mg total) by mouth daily.   BD AutoShield Duo 30G X 5 MM Misc Generic drug: Insulin  Pen Needle   bisacodyl  10 MG suppository Commonly known as: DULCOLAX Place 1 suppository (10 mg total) rectally at bedtime.   cyanocobalamin  1000 MCG tablet Take 1 tablet (1,000 mcg total) by mouth daily.   DULoxetine  60 MG capsule Commonly known as: CYMBALTA  TAKE ONE CAPSULE BY MOUTH DAILY FOR LEG AND BACK PAINS What changed: See the new instructions.   furosemide  40 MG tablet Commonly known as: LASIX  TAKE ONE TABLET BY MOUTH DAILY . TAKE EXTRA TABLET FOR TWO DAYS AS NEEDED FOR INCREASE LOWER EXTREMITY SWELLING What changed: See the new instructions.   insulin  aspart 100 UNIT/ML injection Commonly known as: novoLOG  Inject 0-9 Units into the skin 3 (three) times daily with meals. Sliding scale insulin  Less than 70 initiate hypoglycemia protocol 70-120  0 units 120-150 1 unit 151-200 2 units 201-250 3 units 251-300 5 units 301-350 7 units 351-400 9 units Greater than 400 call MD What changed:  how much to take additional instructions   insulin  glargine 100 UNIT/ML injection Commonly  known as: LANTUS  Inject 0.1 mLs (10 Units total) into the skin daily. What changed: how much to take   levETIRAcetam  500 MG tablet Commonly known as: KEPPRA  Take 1 tablet (500 mg total) by mouth 2 (two) times daily.   lidocaine  4 % Place 1 patch onto the skin 2 (two) times daily.   senna-docusate 8.6-50 MG tablet Commonly known as: Senokot-S Take 1 tablet by mouth 2 (two) times daily.   terbinafine  250 MG tablet Commonly known as: LAMISIL  Take 1 tablet (250 mg total) by mouth daily for 26 days.   thiamine  100 MG tablet Commonly known as: Vitamin B-1 Take 1 tablet (100 mg total) by mouth daily.   tiZANidine  2 MG tablet Commonly known as: ZANAFLEX  Take 1 tablet (2 mg total) by mouth every 8 (eight) hours as needed for muscle  spasms.               Durable Medical Equipment  (From admission, onward)           Start     Ordered   07/01/24 1016  For home use only DME Overbed table  Once        07/01/24 1016   07/01/24 1016  For home use only DME Hospital bed  Once       Question Answer Comment  Length of Need 6 Months   The above medical condition requires: Patient requires the ability to reposition frequently   Head must be elevated greater than: 30 degrees   Bed type Semi-electric   [employer] Yes   Trapeze Bar Yes      07/01/24 1016            Discharge Exam: There were no vitals filed for this visit. General-appears in no acute distress Heart-S1-S2, regular, no murmur auscultated Lungs-clear to auscultation bilaterally, no wheezing or crackles auscultated Abdomen-soft, nontender, no organomegaly Extremities-no edema in the lower extremities Neuro-alert, oriented x3, no focal deficit noted  Condition at discharge: good  The results of significant diagnostics from this hospitalization (including imaging, microbiology, ancillary and laboratory) are listed below for reference.   Imaging Studies: MR BRAIN WO CONTRAST Result Date: 06/28/2024 EXAM: MRI Brain Without Contrast 06/28/2024 06:40:29 PM TECHNIQUE: Multiplanar multisequence MRI of the head/brain was performed without the administration of intravenous contrast. COMPARISON: CT Head 06/27/24.  MRI Head December 25, 2019. CLINICAL HISTORY: Mental status change, unknown cause; meningitis rule out FINDINGS: Moderately motion limited study and without SWI sequence. Within these limitations: BRAIN AND VENTRICLES: No acute infarct. No intracranial hemorrhage. No mass. No midline shift. Stable ventriculomegaly. Normal flow voids. ORBITS: No acute abnormality. SINUSES AND MASTOIDS: No acute abnormality. BONES AND SOFT TISSUES: Normal marrow signal. No acute soft tissue abnormality. IMPRESSION: 1. Moderately motion limited study without evidence of  acute abnormality. 2. Stable ventriculomegaly. Electronically signed by: Gilmore Molt MD 06/28/2024 07:24 PM EST RP Workstation: [id number]   Overnight EEG with video Result Date: 06/28/2024 Shelton Arlin KIDD, MD     06/29/2024  8:34 AM Patient Name: [name] MRN: [id number] Epilepsy Attending: Arlin KIDD Shelton Referring Physician/Provider: Khaliqdina, Salman, MD Duration: 06/27/2024 1728 to 06/28/2024 1233 Patient history:  63 y.o. male who presents with gradually improving somnolence, then had a witnessed seizure in the ED. Then had intermittent L leg twitching. EEG to evaluate for seizure Level of alertness: Awake, asleep AEDs during EEG study: None Technical aspects: This EEG study was done with scalp electrodes positioned according to the 10-20 International system of  electrode placement. Electrical activity was reviewed with band pass filter of 1-70Hz , sensitivity of 7 uV/mm, display speed of 20mm/sec with a 60Hz  notched filter applied as appropriate. EEG data were recorded continuously and digitally stored.  Video monitoring was available and reviewed as appropriate. Description: EEG showed continuous generalized 3 to 6 Hz theta-delta slowing. Sleep was characterized by sleep spindles (12 to 14 Hz), maximal frontocentral region.  Hyperventilation and photic stimulation were not performed.   ABNORMALITY - Continuous slow, generalized IMPRESSION: This study is suggestive of generalized cerebral dysfunction ( encephalopathy). No seizures or epileptiform discharges were seen throughout the recording. Arlin MALVA Krebs   DG Skull 1-3 Views Result Date: 06/27/2024 CLINICAL DATA:  Altered mental status. Evaluate for shunt malfunction. EXAM: SKULL - 1-3 VIEW COMPARISON:  Head CT earlier today FINDINGS: There is no shunt or shunt catheter tubing. No evidence of focal calvarial abnormality. IMPRESSION: No shunt or shunt catheter tubing. . Electronically Signed   By: Andrea Gasman M.D.   On: 06/27/2024  14:42   DG Abd 1 View Result Date: 06/27/2024 CLINICAL DATA:  Altered level of consciousness, shunt malfunction EXAM: DG ABDOMEN 1V COMPARISON:  09/16/2020, 10/23/2023 FINDINGS: Supine frontal view of the abdomen excludes hemidiaphragms, pelvis, left flank. There is no ventriculostomy catheter identified. Large amount of retained stool throughout the colon. No bowel obstruction or ileus. No masses or abnormal calcifications. IMPRESSION: 1. Significant fecal retention consistent with constipation. 2. No evidence of radiopaque ventriculostomy catheter. Electronically Signed   By: Ozell Daring M.D.   On: 06/27/2024 14:42   DG Cervical Spine 1 View Result Date: 06/27/2024 CLINICAL DATA:  Altered mental status. Evaluate for shunt malfunction. EXAM: DG CERVICAL SPINE - 1 VIEW COMPARISON:  CT earlier today FINDINGS: Frontal view of the cervical spine submitted. No shunt catheter tubing is seen. C3 through C6 fusion hardware. IMPRESSION: No shunt catheter tubing is seen. Electronically Signed   By: Andrea Gasman M.D.   On: 06/27/2024 14:41   CT CERVICAL SPINE WO CONTRAST Result Date: 06/27/2024 CLINICAL DATA:  Altered mental status. EXAM: CT CERVICAL SPINE WITHOUT CONTRAST TECHNIQUE: Multidetector CT imaging of the cervical spine was performed without intravenous contrast. Multiplanar CT image reconstructions were also generated. RADIATION DOSE REDUCTION: This exam was performed according to the departmental dose-optimization program which includes automated exposure control, adjustment of the mA and/or kV according to patient size and/or use of iterative reconstruction technique. COMPARISON:  06/14/2022 FINDINGS: Alignment: Chronic straightening of normal lordosis no listhesis. Skull base and vertebrae: Posterior fusion C3 through C6 with laminectomies. Hardware is intact. Lucency about the left C3 pedicle screw up to 2 mm, unchanged from prior. Lesser lucency about the right C3 pedicle screw, which extends  to the C2-C3 facet. No acute fracture. The dens is intact. Soft tissues and spinal canal: No prevertebral fluid or swelling. No visible canal hematoma. Disc levels: Disc space narrowing with anterior spurring most prominently affecting C5-C6 and C6-C7. No significant change from prior exam. Upper chest: No acute findings. Other: Carotid calcifications. IMPRESSION: 1. No acute findings. 2. Posterior fusion C3 through C6. Lucency about the left C3 pedicle screw up to 2 mm, unchanged from prior. Lesser lucency about the right C3 pedicle screw, which extends to the C2-C3 facet. Findings may represent loosening in the appropriate clinical setting. Electronically Signed   By: Andrea Gasman M.D.   On: 06/27/2024 13:48   CT HEAD WO CONTRAST Result Date: 06/27/2024 CLINICAL DATA:  Altered mental status. EXAM: CT HEAD WITHOUT  CONTRAST TECHNIQUE: Contiguous axial images were obtained from the base of the skull through the vertex without intravenous contrast. RADIATION DOSE REDUCTION: This exam was performed according to the departmental dose-optimization program which includes automated exposure control, adjustment of the mA and/or kV according to patient size and/or use of iterative reconstruction technique. COMPARISON:  Most recent head CT 04/18/2024 FINDINGS: Brain: No acute intracranial hemorrhage. Stable ventriculomegaly, greater than the degree of sulcal prominence. No evidence of acute ischemia. No subdural or extra-axial collection. No midline shift. Vascular: Atherosclerosis of skullbase vasculature without hyperdense vessel or abnormal calcification. Skull: No fracture or focal lesion. Sinuses/Orbits: No acute findings. Chronic defect of the right lamina per pressure. No mastoid effusion. Other: Stable areas of lobulated dermal thickening and scattered sebaceous cysts. IMPRESSION: 1. No acute intracranial abnormality. 2. Stable ventriculomegaly. Electronically Signed   By: Andrea Gasman M.D.   On: 06/27/2024  13:43    Microbiology: Results for orders placed or performed during the hospital encounter of 06/27/24  Fungus Stain     Status: None   Collection Time: 06/30/24  2:44 PM  Result Value Ref Range Status   FUNGUS STAIN Final report  Final    Comment: (NOTE) Performed At: [facility] [address] [place], [place] [id number] Jennette Shorter MD Ey:[id number]    Fungal Source SCALP  Final    Comment: Performed at [facility], [address], [place], [place] [address]  Fungal Stain reflex     Status: None   Collection Time: 06/30/24  2:44 PM  Result Value Ref Range Status   Fungal stain result 1 Comment  Final    Comment: (NOTE) KOH/Calcofluor preparation:  no fungus observed. Performed At: [facility] [address] [place], [place] [id number] Jennette Shorter MD Ey:[id number]     Labs: CBC: Recent Labs  Lab 06/27/24 1515 06/28/24 0157 07/01/24 0509 07/02/24 1409  WBC 9.4 7.8 10.5 9.3  NEUTROABS 6.9  --  7.5  --   HGB 12.1* 10.2* 10.8* 10.3*  HCT 39.1 31.9* 33.6* 31.0*  MCV 82.3 80.6 79.8* 80.3  PLT 300 271 279 274   Basic Metabolic Panel: Recent Labs  Lab 06/27/24 1515 06/28/24 0157 07/01/24 0509 07/02/24 1409  NA 144 141 135 134*  K 5.0 4.1 4.4 4.4  CL 108 108 103 101  CO2 27 24 19* 27  GLUCOSE 147* 118* 176* 198*  BUN 31* 30* 47* 30*  CREATININE 1.40* 1.47* 1.74* 1.38*  CALCIUM  9.3 9.0 8.4* 8.3*  MG  --   --  2.5*  --   PHOS  --   --  4.0  --    Liver Function Tests: Recent Labs  Lab 06/27/24 1515 07/01/24 0509  AST 19 14*  ALT 15 11  ALKPHOS 79 60  BILITOT 1.0 0.6  PROT 7.7 6.2*  ALBUMIN  3.7 2.6*   CBG: Recent Labs  Lab 06/27/24 1350 06/27/24 1518  GLUCAP 171* 159*    Discharge time spent: greater than 30 minutes.  Signed: Sabas GORMAN Brod, MD [facility] 07/04/2024

## 2024-07-04 NOTE — Progress Notes (Signed)
 Report called to Lakrisha at [facility].  Will be going to room 118.  Tech in room giving a bath and putting on clothes that family brought in.  All questions answered.  Waiting for [facility] to be transported.

## 2024-07-05 LAB — VITAMIN B1: Vitamin B1 (Thiamine): 112.8 nmol/L (ref 66.5–200.0)

## 2024-09-25 ENCOUNTER — Ambulatory Visit: Admitting: Neurology

## 2025-03-04 ENCOUNTER — Ambulatory Visit: Admitting: Physician Assistant
